# Patient Record
Sex: Female | Born: 1940 | Race: Black or African American | Hispanic: No | Smoking: Former smoker
Health system: Southern US, Community
[De-identification: ages and names within clinical notes are randomized; demographics above are authoritative.]

## PROBLEM LIST (undated history)

## (undated) DIAGNOSIS — G8929 Other chronic pain: Secondary | ICD-10-CM

## (undated) DIAGNOSIS — F0150 Vascular dementia without behavioral disturbance: Secondary | ICD-10-CM

## (undated) DIAGNOSIS — Z8673 Personal history of transient ischemic attack (TIA), and cerebral infarction without residual deficits: Secondary | ICD-10-CM

## (undated) DIAGNOSIS — I69359 Hemiplegia and hemiparesis following cerebral infarction affecting unspecified side: Secondary | ICD-10-CM

## (undated) DIAGNOSIS — L03119 Cellulitis of unspecified part of limb: Secondary | ICD-10-CM

## (undated) DIAGNOSIS — L02419 Cutaneous abscess of limb, unspecified: Secondary | ICD-10-CM

## (undated) DIAGNOSIS — I872 Venous insufficiency (chronic) (peripheral): Secondary | ICD-10-CM

## (undated) DIAGNOSIS — M1990 Unspecified osteoarthritis, unspecified site: Secondary | ICD-10-CM

## (undated) DIAGNOSIS — I5032 Chronic diastolic (congestive) heart failure: Secondary | ICD-10-CM

## (undated) DIAGNOSIS — R600 Localized edema: Secondary | ICD-10-CM

## (undated) DIAGNOSIS — M545 Low back pain, unspecified: Secondary | ICD-10-CM

## (undated) DIAGNOSIS — K219 Gastro-esophageal reflux disease without esophagitis: Secondary | ICD-10-CM

## (undated) DIAGNOSIS — K429 Umbilical hernia without obstruction or gangrene: Secondary | ICD-10-CM

## (undated) DIAGNOSIS — E785 Hyperlipidemia, unspecified: Secondary | ICD-10-CM

## (undated) DIAGNOSIS — G4733 Obstructive sleep apnea (adult) (pediatric): Secondary | ICD-10-CM

## (undated) DIAGNOSIS — F0390 Unspecified dementia without behavioral disturbance: Secondary | ICD-10-CM

## (undated) DIAGNOSIS — I119 Hypertensive heart disease without heart failure: Secondary | ICD-10-CM

## (undated) DIAGNOSIS — M1711 Unilateral primary osteoarthritis, right knee: Secondary | ICD-10-CM

## (undated) DIAGNOSIS — F332 Major depressive disorder, recurrent severe without psychotic features: Secondary | ICD-10-CM

## (undated) DIAGNOSIS — R32 Unspecified urinary incontinence: Secondary | ICD-10-CM

## (undated) DIAGNOSIS — I4819 Other persistent atrial fibrillation: Secondary | ICD-10-CM

## (undated) DIAGNOSIS — M4800 Spinal stenosis, site unspecified: Secondary | ICD-10-CM

## (undated) DIAGNOSIS — T4145XA Adverse effect of unspecified anesthetic, initial encounter: Secondary | ICD-10-CM

## (undated) DIAGNOSIS — T8859XA Other complications of anesthesia, initial encounter: Secondary | ICD-10-CM

## (undated) DIAGNOSIS — E210 Primary hyperparathyroidism: Secondary | ICD-10-CM

## (undated) DIAGNOSIS — F419 Anxiety disorder, unspecified: Secondary | ICD-10-CM

## (undated) DIAGNOSIS — I8390 Asymptomatic varicose veins of unspecified lower extremity: Secondary | ICD-10-CM

## (undated) DIAGNOSIS — Z8719 Personal history of other diseases of the digestive system: Secondary | ICD-10-CM

## (undated) DIAGNOSIS — Z8489 Family history of other specified conditions: Secondary | ICD-10-CM

## (undated) DIAGNOSIS — IMO0002 Reserved for concepts with insufficient information to code with codable children: Secondary | ICD-10-CM

## (undated) DIAGNOSIS — Z9989 Dependence on other enabling machines and devices: Secondary | ICD-10-CM

## (undated) HISTORY — PX: CATARACT EXTRACTION: SUR2

## (undated) HISTORY — PX: CHOLECYSTECTOMY: SHX55

## (undated) HISTORY — DX: Localized edema: R60.0

## (undated) HISTORY — DX: Major depressive disorder, recurrent severe without psychotic features: F33.2

## (undated) HISTORY — DX: Chronic diastolic (congestive) heart failure: I50.32

## (undated) HISTORY — DX: Hemiplegia and hemiparesis following cerebral infarction affecting unspecified side: I69.359

## (undated) HISTORY — DX: Venous insufficiency (chronic) (peripheral): I87.2

## (undated) HISTORY — DX: Reserved for concepts with insufficient information to code with codable children: IMO0002

## (undated) HISTORY — PX: COLONOSCOPY: SHX174

## (undated) HISTORY — DX: Personal history of transient ischemic attack (TIA), and cerebral infarction without residual deficits: Z86.73

## (undated) HISTORY — DX: Hypertensive heart disease without heart failure: I11.9

## (undated) HISTORY — DX: Spinal stenosis, site unspecified: M48.00

## (undated) HISTORY — DX: Hyperlipidemia, unspecified: E78.5

## (undated) HISTORY — PX: IUD REMOVAL: SHX5392

## (undated) HISTORY — DX: Other persistent atrial fibrillation: I48.19

## (undated) HISTORY — DX: Primary hyperparathyroidism: E21.0

---

## 1978-09-08 DIAGNOSIS — Z8711 Personal history of peptic ulcer disease: Secondary | ICD-10-CM

## 1978-09-08 DIAGNOSIS — Z8719 Personal history of other diseases of the digestive system: Secondary | ICD-10-CM

## 1978-09-08 HISTORY — DX: Personal history of other diseases of the digestive system: Z87.19

## 1978-09-08 HISTORY — DX: Personal history of peptic ulcer disease: Z87.11

## 1998-10-30 ENCOUNTER — Other Ambulatory Visit: Admission: RE | Admit: 1998-10-30 | Discharge: 1998-10-30 | Payer: Self-pay | Admitting: Internal Medicine

## 1998-12-16 ENCOUNTER — Other Ambulatory Visit: Admission: RE | Admit: 1998-12-16 | Discharge: 1998-12-16 | Payer: Self-pay | Admitting: General Surgery

## 1999-01-26 ENCOUNTER — Ambulatory Visit (HOSPITAL_BASED_OUTPATIENT_CLINIC_OR_DEPARTMENT_OTHER): Admission: RE | Admit: 1999-01-26 | Discharge: 1999-01-26 | Payer: Self-pay | Admitting: General Surgery

## 1999-01-26 ENCOUNTER — Encounter (INDEPENDENT_AMBULATORY_CARE_PROVIDER_SITE_OTHER): Payer: Self-pay | Admitting: *Deleted

## 1999-06-06 ENCOUNTER — Other Ambulatory Visit: Admission: RE | Admit: 1999-06-06 | Discharge: 1999-06-06 | Payer: Self-pay | Admitting: Internal Medicine

## 2000-03-12 ENCOUNTER — Encounter (INDEPENDENT_AMBULATORY_CARE_PROVIDER_SITE_OTHER): Payer: Self-pay | Admitting: Specialist

## 2000-03-12 ENCOUNTER — Ambulatory Visit (HOSPITAL_COMMUNITY): Admission: RE | Admit: 2000-03-12 | Discharge: 2000-03-12 | Payer: Self-pay | Admitting: *Deleted

## 2002-08-06 ENCOUNTER — Other Ambulatory Visit: Admission: RE | Admit: 2002-08-06 | Discharge: 2002-08-06 | Payer: Self-pay | Admitting: *Deleted

## 2002-10-22 ENCOUNTER — Encounter: Payer: Self-pay | Admitting: General Surgery

## 2002-10-28 ENCOUNTER — Inpatient Hospital Stay (HOSPITAL_COMMUNITY): Admission: RE | Admit: 2002-10-28 | Discharge: 2002-10-29 | Payer: Self-pay | Admitting: General Surgery

## 2003-08-23 ENCOUNTER — Other Ambulatory Visit: Admission: RE | Admit: 2003-08-23 | Discharge: 2003-08-23 | Payer: Self-pay | Admitting: Internal Medicine

## 2004-12-04 ENCOUNTER — Other Ambulatory Visit: Admission: RE | Admit: 2004-12-04 | Discharge: 2004-12-04 | Payer: Self-pay | Admitting: Internal Medicine

## 2006-10-01 ENCOUNTER — Encounter: Admission: RE | Admit: 2006-10-01 | Discharge: 2006-10-01 | Payer: Self-pay | Admitting: Family Medicine

## 2007-01-22 ENCOUNTER — Encounter: Admission: RE | Admit: 2007-01-22 | Discharge: 2007-01-22 | Payer: Self-pay | Admitting: Family Medicine

## 2007-08-31 ENCOUNTER — Other Ambulatory Visit: Admission: RE | Admit: 2007-08-31 | Discharge: 2007-08-31 | Payer: Self-pay | Admitting: Family Medicine

## 2009-02-09 ENCOUNTER — Encounter (HOSPITAL_COMMUNITY): Admission: RE | Admit: 2009-02-09 | Discharge: 2009-04-13 | Payer: Self-pay | Admitting: Family Medicine

## 2009-11-13 ENCOUNTER — Encounter (HOSPITAL_COMMUNITY)
Admission: RE | Admit: 2009-11-13 | Discharge: 2010-01-06 | Payer: Self-pay | Source: Home / Self Care | Attending: Family Medicine | Admitting: Family Medicine

## 2009-12-05 ENCOUNTER — Encounter: Admission: RE | Admit: 2009-12-05 | Discharge: 2009-12-05 | Payer: Self-pay | Admitting: Family Medicine

## 2009-12-16 ENCOUNTER — Encounter
Admission: RE | Admit: 2009-12-16 | Discharge: 2009-12-16 | Payer: Self-pay | Source: Home / Self Care | Attending: Neurosurgery | Admitting: Neurosurgery

## 2010-01-28 ENCOUNTER — Encounter: Payer: Self-pay | Admitting: Family Medicine

## 2010-04-25 ENCOUNTER — Other Ambulatory Visit: Payer: Self-pay | Admitting: Family Medicine

## 2010-04-25 DIAGNOSIS — Z09 Encounter for follow-up examination after completed treatment for conditions other than malignant neoplasm: Secondary | ICD-10-CM

## 2010-05-09 ENCOUNTER — Other Ambulatory Visit: Payer: Self-pay | Admitting: Family Medicine

## 2010-05-09 ENCOUNTER — Ambulatory Visit
Admission: RE | Admit: 2010-05-09 | Discharge: 2010-05-09 | Disposition: A | Discharge status: Home / Self Care | Payer: PRIVATE HEALTH INSURANCE | Source: Ambulatory Visit | Attending: Family Medicine | Admitting: Family Medicine

## 2010-05-09 DIAGNOSIS — Z09 Encounter for follow-up examination after completed treatment for conditions other than malignant neoplasm: Secondary | ICD-10-CM

## 2010-05-16 ENCOUNTER — Other Ambulatory Visit: Payer: Self-pay | Admitting: Gastroenterology

## 2010-05-25 NOTE — Op Note (Signed)
[facility]  Patient:    Susan [name]                            MRN: [id number] Proc. Date: 01/26/99 Adm. Date:  [id number] Attending:  Brandy Hale CC:         Kendrick Ranch, M.D.                           Operative Report  PREOPERATIVE DIAGNOSIS:  Right breast mass.  POSTOPERATIVE DIAGNOSIS:  Right breast mass.  PROCEDURE:  Excisional biopsy of right breast mass.  SURGEON:  Angelia Mould. Derrell Lolling, M.D.  ANESTHESIA:  OPERATIVE INDICATIONS:  This is a 70 year old black female who was found to have a palpable mass in the 11:30 position of the right breast on routine breast exam.  Mammography and ultrasound were not diagnostic.  The patient is concerned because her mother had breast cancer.  A fine-needle aspiration cytology of this area showed nondiagnostic material.  She is brought to the operating room to biopsy his focal area of thickening.  DESCRIPTION OF PROCEDURE:  The patient was brought to the operating room and placed supine on the operating table.  She was monitored and sedated by the anesthesia  department.  The right breast was prepped and draped in a sterile fashion.  One  percent Xylocaine with epinephrine was used as a local infiltration anesthetic. A curved incision was made at the 11:30 position of the right breast, about 2 cm peripheral to the alveolar margin.  The incision was parallel to the alveolar margin.  Dissection was carried down deep into the breast tissue, thoroughly excised a thickened area of breast tissue about 3 x 4 cm in diameter.  This had the appearance of fibrosis and fibrocystic breast disease.  This was sent for permanent histology.  Hemostasis was excellent and achieved with electrocautery.  The wound was irrigated with saline.  The skin was closed with a running subcuticular suture of 4-0 Vicryl and Steri-Strips.  Clean bandages were placed and the patient taken to the recovery  room in stable  condition.  Estimated blood loss was about 10 cc.  Complications none.  Sponge,  needle, and instrument counts were correct. DD:  01/26/99 TD:  01/27/99 Job: [id number]

## 2010-05-25 NOTE — Op Note (Signed)
NAME:  Susan [name], Susan [name]                            ACCOUNT NO.:  [id number]   MEDICAL RECORD NO.:  [id number]                   PATIENT TYPE:  AMB   LOCATION:  DAY                                  FACILITY:  [facility]   PHYSICIAN:  Angelia Mould. Derrell Lolling, M.D.             DATE OF BIRTH:  02-21-1940   DATE OF PROCEDURE:  10/27/2002  DATE OF DISCHARGE:                                 OPERATIVE REPORT   PREOPERATIVE DIAGNOSIS:  Ventral incisional hernia.   POSTOPERATIVE DIAGNOSIS:  Ventral incisional hernia.   OPERATION PERFORMED:  Laparoscopic repair of ventral incisional hernia with  15 cm x 16 cm Parietex composite mesh.   SURGEON:  Angelia Mould. Derrell Lolling, M.D.   FIRST ASSISTANT:  Currie Paris, M.D.   INDICATIONS FOR PROCEDURE:  This is a 70 year old black female who underwent  laparoscopic cholecystectomy in [place] in February of this year. The  surgery was uneventful but the surgeon described an umbilical hernia that  was repaired and also postop care was complicated by a wound infection at  the umbilicus which did not heal. She developed progressive bulge which is  painful. On exam, she is somewhat overweight with well healed laparoscopy  scars. She has a hernia defect at the umbilicus which is at least 5 cm in  size, this is completely reducible. She is brought to the operating room  electively.   FINDINGS:  The patient had a ventral hernia centered around the umbilicus.  The defect was at least 6 cm in size. There was some omentum incarcerated at  the superior rim but this was easily dissected away with good visualization  of the transverse colon. There were no other hernia defects noted. The liver  looked normal. The stomach looked normal. The omentum, small bowel and colon  otherwise looked normal. There was on free fluid.   TECHNIQUE:  Following the induction general endotracheal anesthesia, an oral  gastric tube and a Foley catheter were inserted. The abdomen was prepped  and  draped in a sterile fashion. 0.5% Marcaine with epinephrine was used as a  local infiltration anesthetic. Using an Optiview port and a 0 degree camera,  we inserted a 10 mm trocar in the left subcostal region. This was done under  direct vision and we passed through the abdominal wall layers fairly easily.  Insufflation was carried out to 15 mmHg. The video camera was inserted with  visualization and findings as described above. The two 5 mm trocars were  placed in the right mid abdomen and two 5 mm trocars placed in the left mid  abdomen well lateral. Omental adhesions were visualized and inspected and  then taken down using sharp dissection. A few bleeders required  cauterization but not much. There was minimal bleeding. Using a spinal  needle, we marked the edges of the hernia defect marking the abdominal wall  with a skin marker. We  then measured 3-4 cm outside of this perimeter of the  hernia defect and marked that and it turned to be about 15 cm in almost  perfect circle. We brought a 15 x 20 cm piece of Parietex composite mesh to  the operative field. We drew a template on the abdominal wall with eight  equidistant suture fixation points. We then transferred this template to the  mesh. We then cut the mesh down to size. We then carefully marked the smooth  side of the mesh and placed eight sutures on the rough side of the mesh  using 0 Novofil sutures in the 8 suture fixation points. We then  moistened  the mesh, rolled it up and inserted it into the abdominal cavity. We then  passed the 8 suture fixation points through the abdominal wall and after  these were all passed we held them up and it looked like the coverage was  good and there was no unusual tension but no redundancy. We then tied all of  the suture fixation points and buried the knots under the skin. Then used a  5 mm tacker and using about 45-50 of the 5 mm metal takers, we tacked the  perimeter of the mesh and the  center of the mesh. At the perimeter of the  mesh, we were very careful and repeatedly inspected to make sure that all of  the tacks were at least within 1 cm of each other with no gaps whatsoever.  We then inspected the repair, inspected all of the abdominal viscera and  found no problems. The trocars were removed under direct vision, there was  no bleeding from any of the trocar sites. The pneumoperitoneum was released.  The skin incisions were closed with subcuticular sutures of 4-0 Vicryl and  Steri-Strips. A clean bandage and a Velcro binder was placed and the patient  taken to the recovery room in stable condition. Estimated blood loss was  about 25 mL. Complications none. Sponge, needle and instrument counts were  correct.                                               Angelia Mould. Derrell Lolling, M.D.    HMI/MEDQ  D:  10/27/2002  T:  10/27/2002  Job:  [id number]   cc:   Sharlet Salina, M.D.  [address]  [place]  [place] [address]  Fax: [phone]   Chester Holstein. Earlene Plater, M.D.  [address]  [place]  [place] [address]  Fax: [phone]

## 2010-12-14 ENCOUNTER — Ambulatory Visit
Admission: RE | Admit: 2010-12-14 | Discharge: 2010-12-14 | Disposition: A | Discharge status: Home / Self Care | Payer: Medicare Other | Source: Ambulatory Visit | Attending: Family Medicine | Admitting: Family Medicine

## 2010-12-14 ENCOUNTER — Other Ambulatory Visit: Payer: Self-pay | Admitting: Family Medicine

## 2010-12-14 DIAGNOSIS — R05 Cough: Secondary | ICD-10-CM

## 2011-01-28 ENCOUNTER — Ambulatory Visit
Admission: RE | Admit: 2011-01-28 | Discharge: 2011-01-28 | Disposition: A | Discharge status: Home / Self Care | Payer: Medicare Other | Source: Ambulatory Visit | Attending: Family Medicine | Admitting: Family Medicine

## 2011-01-28 ENCOUNTER — Other Ambulatory Visit: Payer: Self-pay | Admitting: Family Medicine

## 2011-01-28 DIAGNOSIS — R05 Cough: Secondary | ICD-10-CM

## 2011-03-27 ENCOUNTER — Other Ambulatory Visit: Payer: Self-pay | Admitting: Certified Registered Nurse Anesthetist

## 2011-04-24 ENCOUNTER — Emergency Department (INDEPENDENT_AMBULATORY_CARE_PROVIDER_SITE_OTHER): Payer: Medicare Other

## 2011-04-24 ENCOUNTER — Encounter (HOSPITAL_BASED_OUTPATIENT_CLINIC_OR_DEPARTMENT_OTHER): Payer: Self-pay | Admitting: Emergency Medicine

## 2011-04-24 ENCOUNTER — Emergency Department (HOSPITAL_BASED_OUTPATIENT_CLINIC_OR_DEPARTMENT_OTHER)
Admission: EM | Admit: 2011-04-24 | Discharge: 2011-04-24 | Disposition: A | Discharge status: Home / Self Care | Payer: Medicare Other | Attending: Emergency Medicine | Admitting: Emergency Medicine

## 2011-04-24 DIAGNOSIS — I289 Disease of pulmonary vessels, unspecified: Secondary | ICD-10-CM

## 2011-04-24 DIAGNOSIS — W19XXXA Unspecified fall, initial encounter: Secondary | ICD-10-CM

## 2011-04-24 DIAGNOSIS — R404 Transient alteration of awareness: Secondary | ICD-10-CM

## 2011-04-24 DIAGNOSIS — M25469 Effusion, unspecified knee: Secondary | ICD-10-CM

## 2011-04-24 DIAGNOSIS — M25561 Pain in right knee: Secondary | ICD-10-CM

## 2011-04-24 DIAGNOSIS — M25569 Pain in unspecified knee: Secondary | ICD-10-CM | POA: Insufficient documentation

## 2011-04-24 DIAGNOSIS — R51 Headache: Secondary | ICD-10-CM

## 2011-04-24 DIAGNOSIS — W010XXA Fall on same level from slipping, tripping and stumbling without subsequent striking against object, initial encounter: Secondary | ICD-10-CM | POA: Insufficient documentation

## 2011-04-24 DIAGNOSIS — M1710 Unilateral primary osteoarthritis, unspecified knee: Secondary | ICD-10-CM

## 2011-04-24 LAB — URINALYSIS, ROUTINE W REFLEX MICROSCOPIC
Glucose, UA: NEGATIVE mg/dL
Hgb urine dipstick: NEGATIVE
pH: 5 (ref 5.0–8.0)

## 2011-04-24 LAB — COMPREHENSIVE METABOLIC PANEL
ALT: 24 U/L (ref 0–35)
Alkaline Phosphatase: 69 U/L (ref 39–117)
BUN: 16 mg/dL (ref 6–23)
CO2: 28 mEq/L (ref 19–32)
Calcium: 10.7 mg/dL — ABNORMAL HIGH (ref 8.4–10.5)
GFR calc Af Amer: 72 mL/min — ABNORMAL LOW (ref >90)
GFR calc non Af Amer: 62 mL/min — ABNORMAL LOW (ref >90)
Glucose, Bld: 110 mg/dL — ABNORMAL HIGH (ref 70–99)
Sodium: 141 mEq/L (ref 135–145)

## 2011-04-24 LAB — CBC
HCT: 34.3 % — ABNORMAL LOW (ref 36.0–46.0)
Hemoglobin: 11.8 g/dL — ABNORMAL LOW (ref 12.0–15.0)
MCH: 31.3 pg (ref 26.0–34.0)
MCV: 91 fL (ref 78.0–100.0)
Platelets: 219 10*3/uL (ref 150–400)
RBC: 3.77 MIL/uL — ABNORMAL LOW (ref 3.87–5.11)
WBC: 5 10*3/uL (ref 4.0–10.5)

## 2011-04-24 LAB — POCT I-STAT 3, ART BLOOD GAS (G3+)
TCO2: 27 mmol/L (ref 0–100)
pH, Arterial: 7.345 — ABNORMAL LOW (ref 7.350–7.400)

## 2011-04-24 LAB — RAPID URINE DRUG SCREEN, HOSP PERFORMED
Amphetamines: NOT DETECTED
Barbiturates: NOT DETECTED
Tetrahydrocannabinol: NOT DETECTED

## 2011-04-24 LAB — LIPASE, BLOOD: Lipase: 45 U/L (ref 11–59)

## 2011-04-24 LAB — DIFFERENTIAL
Eosinophils Absolute: 0.2 10*3/uL (ref 0.0–0.7)
Eosinophils Relative: 4 % (ref 0–5)
Lymphocytes Relative: 27 % (ref 12–46)
Lymphs Abs: 1.3 10*3/uL (ref 0.7–4.0)
Monocytes Relative: 13 % — ABNORMAL HIGH (ref 3–12)

## 2011-04-24 LAB — GLUCOSE, CAPILLARY: Glucose-Capillary: 101 mg/dL — ABNORMAL HIGH (ref 70–99)

## 2011-04-24 LAB — URINE MICROSCOPIC-ADD ON

## 2011-04-24 LAB — ETHANOL: Alcohol, Ethyl (B): <11 mg/dL (ref 0–11)

## 2011-04-24 LAB — PROTIME-INR: Prothrombin Time: 12.8 seconds (ref 11.6–15.2)

## 2011-04-24 MED ORDER — TRAMADOL HCL 50 MG PO TABS: 50.0000 mg | ORAL_TABLET | Freq: Four times a day (QID) | ORAL | Status: AC | PRN

## 2011-04-24 MED ORDER — SODIUM CHLORIDE 0.9 % IV BOLUS (SEPSIS)
1000.0000 mL | Freq: Once | INTRAVENOUS | Status: AC
  Administered 2011-04-24: 1000 mL via INTRAVENOUS

## 2011-04-24 NOTE — ED Notes (Signed)
Fell at [facility] injured  Rt knee and elbow

## 2011-04-24 NOTE — ED Notes (Signed)
Pt ambulated to restroom with minimal assist. Pt alert and oriented x4, family at bs.

## 2011-04-24 NOTE — ED Provider Notes (Signed)
History     CSN: [id number]  Arrival date & time 04/24/11  0208   First MD Initiated Contact with Patient 04/24/11 0211      Chief Complaint  Patient presents with  . Fall    (Consider location/radiation/quality/duration/timing/severity/associated sxs/prior treatment) HPI Patient is a 71 year old female who presents by EMS with her granddaughter for evaluation of the right elbow and right knee injury. Patient apparently was by herself this afternoon when she tripped and fell. Since that time the patient had complained of right knee and right elbow pain to family. Patient had been unable to get up herself bystander had to help her out. Patient had then gone home and got in her bathtub and been unable to get out of her bathtub. Family had come to assist her out. With her granddaughter went to check on the patient the patient was very upset. She spoke to her regular doctor who recommended coming to the ER to be evaluated. Family has been trying to get the patient evaluated recently for problems with fluctuating mental status. Here patient is very somnolent but can be stimulated with sternal rub and a stent oriented x4. Patient is not at her baseline per family. Patient has had no recent cough or illnesses. She's had no other complaints. On my exam the patient complains only of 3/10 right knee pain. She is hemodynamically stable but when sleeping soundly he does desat as low as 85%. Family reports that the patient does not normally wear CPAP. They do have concerns over patient's ingestion of medications given her recent confusion. Family reports that the patient has depression but that they did not think that she has taken any medications in an attempt to hurt herself.  Granddaughter reports that her grandmother's mental state has declined since she arrived prior to EMS. History is otherwise limited secondary to patient's mental state. Past Medical History  Diagnosis Date  . Hypertension      History reviewed. No pertinent past surgical history.  No family history on file.  History  Substance Use Topics  . Smoking status: Not on file  . Smokeless tobacco: Not on file  . Alcohol Use: No    OB History    Grav Para Term Preterm Abortions TAB SAB Ect Mult Living                  Review of Systems  Unable to perform ROS: Mental status change    Allergies  Review of patient's allergies indicates no known allergies.  Home Medications  No current outpatient prescriptions on file.  BP 152/75  Pulse 60  Temp(Src) 98.3 F (36.8 C) (Oral)  Resp 17  SpO2 94%  Physical Exam  Nursing note and vitals reviewed. GEN: Well-developed, well-nourished female in no distress HEENT: Atraumatic, normocephalic. Oropharynx clear without erythema EYES: PERRLA BL, no scleral icterus. NECK: Trachea midline, no meningismus CV: regular rate and rhythm. No murmurs, rubs, or gallops PULM: No respiratory distress.  No crackles, wheezes, or rales. GI: soft, non-tender. No guarding, rebound, or tenderness. + bowel sounds  GU: deferred Neuro: cranial nerves 2-12 intact, no abnormalities of strength or sensation, somnolent but arousable to sternal rub and then O x 3 MSK: Patient moves all 4 extremities symmetrically, no deformity, edema, or injury noted. Patient does have slight tenderness to palpation over the right knee with no ligamentous instability or deformity noted. Skin: No rashes petechiae, purpura, or jaundice   ED Course  Procedures (including critical care time)  Date: 04/24/2011  Rate: 63  Rhythm: normal sinus rhythm  QRS Axis: normal  Intervals: normal  ST/T Wave abnormalities: nonspecific T wave changes  Conduction Disutrbances:none  Narrative Interpretation:   Old EKG Reviewed: none available   Labs Reviewed  CBC - Abnormal; Notable for the following:    RBC 3.77 (*)    Hemoglobin 11.8 (*)    HCT 34.3 (*)    All other components within normal limits   DIFFERENTIAL - Abnormal; Notable for the following:    Monocytes Relative 13 (*)    All other components within normal limits  POCT I-STAT 3, BLOOD GAS (G3+) - Abnormal; Notable for the following:    pH, Arterial 7.345 (*)    pCO2 arterial 47.7 (*)    pO2, Arterial 31.0 (*)    Bicarbonate 26.0 (*)    All other components within normal limits  GLUCOSE, CAPILLARY - Abnormal; Notable for the following:    Glucose-Capillary 101 (*)    All other components within normal limits  PROTIME-INR  COMPREHENSIVE METABOLIC PANEL  LIPASE, BLOOD  BLOOD GAS, ARTERIAL  AMMONIA  ACETAMINOPHEN LEVEL  SALICYLATE LEVEL  URINALYSIS, ROUTINE W REFLEX MICROSCOPIC  URINE CULTURE  TROPONIN I  URINE RAPID DRUG SCREEN (HOSP PERFORMED)  ETHANOL   Dg Chest 2 View  04/24/2011  *RADIOLOGY REPORT*  Clinical Data: Status post fall; headache and altered level of consciousness.  Concern for chest injury.  CHEST - 2 VIEW  Comparison: Chest radiograph performed 01/28/2011  Findings: The lungs are hypoexpanded.  Vascular congestion is noted, with diffusely increased interstitial markings, raising concern for mild pulmonary edema.  No definite pleural effusion or pneumothorax is seen.  The heart is borderline enlarged; the mediastinal contour is within normal limits.  No acute osseous abnormalities are seen.  IMPRESSION:  1.  Lungs hypoexpanded; vascular congestion and borderline cardiomegaly, with diffusely increased interstitial markings, raising concern for mild interstitial edema. 2.  No displaced rib fractures seen.  Original Report Authenticated By: Tonia Ghent, M.D.   Ct Head Wo Contrast  04/24/2011  *RADIOLOGY REPORT*  Clinical Data: Status post fall; headache and altered level of consciousness.  CT HEAD WITHOUT CONTRAST  Technique:  Contiguous axial images were obtained from the base of the skull through the vertex without contrast.  Comparison: None.  Findings: There is no evidence of acute infarction, mass  lesion, or intra- or extra-axial hemorrhage on CT.  Prominence of the ventricles and sulci reflects mild cortical volume loss.  Scattered periventricular and subcortical white matter change likely reflects small vessel ischemic microangiopathy.  The posterior fossa, including the cerebellum, brainstem and fourth ventricle, is within normal limits.  The basal ganglia are unremarkable in appearance.  The cerebral hemispheres demonstrate grossly normal gray-white differentiation.  No mass effect or midline shift is seen.  There is no evidence of fracture; visualized osseous structures are unremarkable in appearance.  The visualized portions of the orbits are within normal limits.  The paranasal sinuses and mastoid air cells are well-aerated.  No significant soft tissue abnormalities are seen.  IMPRESSION:  1.  No evidence of traumatic intracranial injury or fracture. 2.  Mild cortical volume loss and scattered small vessel ischemic angiopathy.  Original Report Authenticated By: Tonia Ghent, M.D.   Dg Knee Complete 4 Views Right  04/24/2011  *RADIOLOGY REPORT*  Clinical Data: Status post fall; right knee pain.  RIGHT KNEE - COMPLETE 4+ VIEW  Comparison: None.  Findings: There is no evidence of fracture or dislocation. Marginal  osteophytes are noted at all three compartments, without significant joint space narrowing.  Prominent tibial spine and wall osteophytes are also seen.  A fabella is noted.  A trace joint effusion is seen.  The visualized soft tissues are otherwise unremarkable in appearance.  IMPRESSION:  1.  No evidence of fracture or dislocation. 2.  Tricompartmental osteoarthritis noted. 3.  Trace knee joint effusion noted.  Original Report Authenticated By: Tonia Ghent, M.D.     1. Knee pain, right   2. Fall       MDM  Patient was evaluated by myself. Based on patient's somnolence I was concerned for possible intracranial injury or other process including toxic ingestion or infectious  process.  Family confirmed that this is very different for the patient. Workup was performed. This included head CT as well as EKG, troponin, and chest x-ray, urinalysis, urine drug screen, Tylenol level, salicylate level, ethanol level, CBC, and CMP. ABG was ordered but this returned a mixed sample. There is no hypercarbia or acidosis noted. Patient was given a liter of normal saline IV bolus. CBC, coags, and EKG were within normal limits. Fingerstick was 101.  Extensive workup was negative. Patient remained somnolent but was able to be awoken him to ambulate. Patient and family were notified of results. They were comfortable with plan for discharge home. Patient was able to ambulate around the emergency department without difficulty. She was discharged home with a prescription for 15 tabs of Tylenol. 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        Cyndra Numbers, MD 04/24/11 [phone]

## 2011-04-24 NOTE — ED Notes (Signed)
Pt return from radiology, O2 continues @2L  via N/C, SR on monitor, IV fluids infusing without diff, IV site unremarkable. Family at bs, SR up x2.

## 2011-04-24 NOTE — Discharge Instructions (Signed)
Arthralgia Your caregiver has diagnosed you as suffering from an arthralgia. Arthralgia means there is pain in a joint. This can come from many reasons including:  Bruising the joint which causes soreness (inflammation) in the joint.   Wear and tear on the joints which occur as we grow older (osteoarthritis).   Overusing the joint.   Various forms of arthritis.   Infections of the joint.  Regardless of the cause of pain in your joint, most of these different pains respond to anti-inflammatory drugs and rest. The exception to this is when a joint is infected, and these cases are treated with antibiotics, if it is a bacterial infection. HOME CARE INSTRUCTIONS   Rest the injured area for as long as directed by your caregiver. Then slowly start using the joint as directed by your caregiver and as the pain allows. Crutches as directed may be useful if the ankles, knees or hips are involved. If the knee was splinted or casted, continue use and care as directed. If an stretchy or elastic wrapping bandage has been applied today, it should be removed and re-applied every 3 to 4 hours. It should not be applied tightly, but firmly enough to keep swelling down. Watch toes and feet for swelling, bluish discoloration, coldness, numbness or excessive pain. If any of these problems (symptoms) occur, remove the ace bandage and re-apply more loosely. If these symptoms persist, contact your caregiver or return to this location.   For the first 24 hours, keep the injured extremity elevated on pillows while lying down.   Apply ice for 15 to 20 minutes to the sore joint every couple hours while awake for the first half day. Then 3 to 4 times per day for the first 48 hours. Put the ice in a plastic bag and place a towel between the bag of ice and your skin.   Wear any splinting, casting, elastic bandage applications, or slings as instructed.   Only take over-the-counter or prescription medicines for pain,  discomfort, or fever as directed by your caregiver. Do not use aspirin immediately after the injury unless instructed by your physician. Aspirin can cause increased bleeding and bruising of the tissues.   If you were given crutches, continue to use them as instructed and do not resume weight bearing on the sore joint until instructed.  Persistent pain and inability to use the sore joint as directed for more than 2 to 3 days are warning signs indicating that you should see a caregiver for a follow-up visit as soon as possible. Initially, a hairline fracture (break in bone) may not be evident on X-rays. Persistent pain and swelling indicate that further evaluation, non-weight bearing or use of the joint (use of crutches or slings as instructed), or further X-rays are indicated. X-rays may sometimes not show a small fracture until a week or 10 days later. Make a follow-up appointment with your own caregiver or one to whom we have referred you. A radiologist (specialist in reading X-rays) may read your X-rays. Make sure you know how you are to obtain your X-ray results. Do not assume everything is normal if you do not hear from us. SEEK MEDICAL CARE IF: Bruising, swelling, or pain increases. SEEK IMMEDIATE MEDICAL CARE IF:   Your fingers or toes are numb or blue.   The pain is not responding to medications and continues to stay the same or get worse.   The pain in your joint becomes severe.   You develop a fever over   102 F (38.9 C).   It becomes impossible to move or use the joint.  MAKE SURE YOU:   Understand these instructions.   Will watch your condition.   Will get help right away if you are not doing well or get worse.  Document Released: 12/24/2004 Document Revised: 12/13/2010 Document Reviewed: 08/12/2007 [employer] Patient Information 2012 [employer]. 

## 2011-04-24 NOTE — ED Notes (Signed)
Patient transported to X-ray 

## 2011-04-24 NOTE — ED Notes (Signed)
Fell around 3pm  Complains of knee pain

## 2011-04-24 NOTE — ED Notes (Signed)
Pt was stuck for an ABG (arterial blood gas) but pt moved during the initial puncture and needle was dislodged from the artery. Sample was run but mixed venous was sampled. MD was notified and no redraw took place due to pt moving too much with needle sticks or punctures.

## 2011-04-25 LAB — URINE CULTURE
Colony Count: NO GROWTH
Culture  Setup Time: 201304170620

## 2011-09-02 ENCOUNTER — Other Ambulatory Visit: Payer: Self-pay | Admitting: Neurosurgery

## 2011-09-02 DIAGNOSIS — M4306 Spondylolysis, lumbar region: Secondary | ICD-10-CM

## 2011-09-02 DIAGNOSIS — M48061 Spinal stenosis, lumbar region without neurogenic claudication: Secondary | ICD-10-CM

## 2011-09-13 ENCOUNTER — Ambulatory Visit
Admission: RE | Admit: 2011-09-13 | Discharge: 2011-09-13 | Disposition: A | Discharge status: Home / Self Care | Payer: Medicare Other | Source: Ambulatory Visit | Attending: Neurosurgery | Admitting: Neurosurgery

## 2011-09-13 DIAGNOSIS — M48061 Spinal stenosis, lumbar region without neurogenic claudication: Secondary | ICD-10-CM

## 2011-09-13 DIAGNOSIS — M4306 Spondylolysis, lumbar region: Secondary | ICD-10-CM

## 2011-10-01 ENCOUNTER — Encounter: Payer: Self-pay | Admitting: Pulmonary Disease

## 2011-10-02 ENCOUNTER — Ambulatory Visit (INDEPENDENT_AMBULATORY_CARE_PROVIDER_SITE_OTHER): Payer: Medicare Other | Admitting: Pulmonary Disease

## 2011-10-02 ENCOUNTER — Encounter: Payer: Self-pay | Admitting: Pulmonary Disease

## 2011-10-02 VITALS — BP 124/80 | HR 63 | Temp 98.8°F | Ht 64.0 in | Wt 200.4 lb

## 2011-10-02 DIAGNOSIS — R053 Chronic cough: Secondary | ICD-10-CM | POA: Insufficient documentation

## 2011-10-02 DIAGNOSIS — R05 Cough: Secondary | ICD-10-CM

## 2011-10-02 MED ORDER — OMEPRAZOLE 40 MG PO CPDR: 40.0000 mg | DELAYED_RELEASE_CAPSULE | Freq: Two times a day (BID) | ORAL | Status: DC

## 2011-10-02 NOTE — Patient Instructions (Addendum)
Take chlorpheniramine 4mg , 2 at bedtime each night Omeprazole 40mg  take one in am and pm everyday (stop prilosec) Stop fish oil until we can improve your cough Avoid throat clearing, use hard candy during the day to bathe back of throat (no menthol, mint, or cough drops). followup with me in 3 weeks.

## 2011-10-02 NOTE — Assessment & Plan Note (Signed)
The patient's cough sounds more upper airway in origin than lower.  She has clear lung fields, normal spirometry, and a clear chest x-ray earlier in the year.  She denies having postnasal drip, but is having reflux symptoms despite being on a proton pump inhibitor.  I would like to intensify treatment of reflux disease, and we'll also emperically treat her for postnasal drip with chlorpheniramine.  I have also reviewed the behavioral therapies that can help cyclical coughing.  If she continues to have issues, she may need otolaryngology evaluation to clear her upper airway.

## 2011-10-02 NOTE — Progress Notes (Signed)
  Subjective:    Patient ID: Susan [name], female    DOB: August 12, 1940, 71 y.o.   MRN: [id number]  HPI The patient is a 71 year old female who I've been asked to see for chronic cough.  She has had a cough for over 2 years, but does not feel that it is increasing in severity.  She feels the cough is coming from her throat, but denies frequent throat clearing.  The cough is primarily dry, but can't produce clear mucus at times.  She does not believe the cough is worse with conversation or with strong odors, and states that it can come on suddenly.  She denies any chronic hoarseness or throat pain, but does have an abnormal voice during our visit today.  The patient does have reflux symptoms despite being on Prilosec, and states that she can have regurgitation at times.  She denies chronic sinus disease or significant postnasal drip.  She has no history of asthma or pulmonary disease, and has not seen a change in her exertional tolerance.  She has been on an ACE inhibitor in the past, but currently is on an ARB.  She did have a chest x-ray earlier in the year that showed no acute process.   Review of Systems  Constitutional: Negative for fever and unexpected weight change.  HENT: Negative for ear pain, nosebleeds, congestion, sore throat, rhinorrhea, sneezing, trouble swallowing, dental problem, postnasal drip and sinus pressure.   Eyes: Negative for redness and itching.  Respiratory: Positive for cough. Negative for chest tightness, shortness of breath and wheezing.   Cardiovascular: Negative for palpitations and leg swelling.  Gastrointestinal: Negative for nausea and vomiting.  Genitourinary: Negative for dysuria.  Musculoskeletal: Negative for joint swelling.  Skin: Negative for rash.  Neurological: Negative for headaches.  Hematological: Does not bruise/bleed easily.  Psychiatric/Behavioral: Positive for dysphoric mood ( treated with medication). The patient is not nervous/anxious.          Objective:   Physical Exam Constitutional:  Obese female, no acute distress  HENT:  Nares patent without discharge  Oropharynx without exudate, palate and uvula are elongated.  Eyes:  Perrla, eomi, no scleral icterus  Neck:  No JVD, no TMG  Cardiovascular:  Normal rate, regular rhythm, no rubs or gallops.  2/6 sem        Intact distal pulses  Pulmonary :  Normal breath sounds, no stridor or respiratory distress   No rales, rhonchi, or wheezing  Abdominal:  Soft, nondistended, bowel sounds present.  No tenderness noted.   Musculoskeletal:  1+ lower extremity edema noted.  Lymph Nodes:  No cervical lymphadenopathy noted  Skin:  No cyanosis noted  Neurologic:  Alert, appropriate, moves all 4 extremities without obvious deficit.         Assessment & Plan:

## 2011-10-23 ENCOUNTER — Encounter: Payer: Self-pay | Admitting: Pulmonary Disease

## 2011-10-23 ENCOUNTER — Ambulatory Visit (INDEPENDENT_AMBULATORY_CARE_PROVIDER_SITE_OTHER): Payer: Medicare Other | Admitting: Pulmonary Disease

## 2011-10-23 ENCOUNTER — Ambulatory Visit: Payer: Medicare Other | Admitting: Pulmonary Disease

## 2011-10-23 VITALS — BP 140/82 | HR 89 | Ht 64.0 in | Wt 202.6 lb

## 2011-10-23 DIAGNOSIS — R05 Cough: Secondary | ICD-10-CM

## 2011-10-23 NOTE — Assessment & Plan Note (Signed)
The patient has a chronic cough that is most likely related to an upper airway issue.  I have stressed to her the importance of compliance with our plan, and asked her to get the chlorpheniramine to take nightly.  She is to stay on her proton pump inhibitor as well.  She needs to come off the ACE inhibitor, and given her history of a two-year cough, I would not recommend this treatment be used.  I also asked her to keep working on behavioral therapies.  Given her persistent cough and hoarseness, I think she also needs an upper airway evaluation by otolaryngology.

## 2011-10-23 NOTE — Patient Instructions (Addendum)
Please call your primary md and let them know I have recommended staying off lotensin in light of your chronic cough.  I will send her a note today. You need to get chlorpheniramine 4mg , and take 2 each night at bedtime.  It is important that you do this.  Stay on your omeprazole 40mg  am and pm for reflux. Will refer to ENT to look at your voice box.  Please call me in 3 weeks with your response to treatment.

## 2011-10-23 NOTE — Progress Notes (Signed)
  Subjective:    Patient ID: Susan [name], female    DOB: 10-Feb-1940, 71 y.o.   MRN: [id number]  HPI The patient comes in today for followup of her chronic cough.  At the last visit, she was felt to have cough from an upper airway source, and we intensified her reflux treatment and added chlorpheniramine for possible postnasal drip.  Unfortunately, the patient did not get the chlorpheniramine, but is taking her b.i.d. Proton pump inhibitor.  We also discussed behavioral therapies that helps with cyclical coughing.  Since the last visit, the patient states that her cough has actually worsened.  However, I also noted that her blood pressure medication has been changed to an ACE inhibitor.   Review of Systems  Constitutional: Negative for fever and unexpected weight change.  HENT: Positive for congestion, rhinorrhea and postnasal drip. Negative for ear pain, nosebleeds, sore throat, sneezing, trouble swallowing, dental problem and sinus pressure.   Eyes: Negative for redness and itching.  Respiratory: Positive for cough. Negative for chest tightness, shortness of breath and wheezing.   Cardiovascular: Positive for leg swelling. Negative for palpitations.  Gastrointestinal: Negative for nausea and vomiting.  Genitourinary: Negative for dysuria.  Musculoskeletal: Negative for joint swelling.  Skin: Negative for rash.  Neurological: Negative for headaches.  Hematological: Bruises/bleeds easily.  Psychiatric/Behavioral: Positive for dysphoric mood. The patient is nervous/anxious.        Objective:   Physical Exam Obese female in no acute distress.  Hoarse voice noted Nose without purulent discharge seen Neck without lymphadenopathy or thyromegaly Chest totally clear to auscultation Cardiac exam is regular rate and rhythm Lower extremities with minimal edema, no cyanosis Alert and oriented, moves all 4 extremities.       Assessment & Plan:

## 2012-01-26 ENCOUNTER — Emergency Department (HOSPITAL_BASED_OUTPATIENT_CLINIC_OR_DEPARTMENT_OTHER)
Admission: EM | Admit: 2012-01-26 | Discharge: 2012-01-26 | Disposition: A | Discharge status: Home / Self Care | Payer: No Typology Code available for payment source | Attending: Emergency Medicine | Admitting: Emergency Medicine

## 2012-01-26 ENCOUNTER — Emergency Department (HOSPITAL_BASED_OUTPATIENT_CLINIC_OR_DEPARTMENT_OTHER): Payer: No Typology Code available for payment source

## 2012-01-26 ENCOUNTER — Encounter (HOSPITAL_BASED_OUTPATIENT_CLINIC_OR_DEPARTMENT_OTHER): Payer: Self-pay | Admitting: *Deleted

## 2012-01-26 DIAGNOSIS — M4800 Spinal stenosis, site unspecified: Secondary | ICD-10-CM | POA: Insufficient documentation

## 2012-01-26 DIAGNOSIS — F3289 Other specified depressive episodes: Secondary | ICD-10-CM | POA: Insufficient documentation

## 2012-01-26 DIAGNOSIS — Z79899 Other long term (current) drug therapy: Secondary | ICD-10-CM | POA: Insufficient documentation

## 2012-01-26 DIAGNOSIS — Z791 Long term (current) use of non-steroidal anti-inflammatories (NSAID): Secondary | ICD-10-CM | POA: Insufficient documentation

## 2012-01-26 DIAGNOSIS — S139XXA Sprain of joints and ligaments of unspecified parts of neck, initial encounter: Secondary | ICD-10-CM | POA: Insufficient documentation

## 2012-01-26 DIAGNOSIS — S39012A Strain of muscle, fascia and tendon of lower back, initial encounter: Secondary | ICD-10-CM

## 2012-01-26 DIAGNOSIS — F329 Major depressive disorder, single episode, unspecified: Secondary | ICD-10-CM | POA: Insufficient documentation

## 2012-01-26 DIAGNOSIS — E785 Hyperlipidemia, unspecified: Secondary | ICD-10-CM | POA: Insufficient documentation

## 2012-01-26 DIAGNOSIS — Z87891 Personal history of nicotine dependence: Secondary | ICD-10-CM | POA: Insufficient documentation

## 2012-01-26 DIAGNOSIS — I10 Essential (primary) hypertension: Secondary | ICD-10-CM | POA: Insufficient documentation

## 2012-01-26 DIAGNOSIS — Y929 Unspecified place or not applicable: Secondary | ICD-10-CM | POA: Insufficient documentation

## 2012-01-26 DIAGNOSIS — M129 Arthropathy, unspecified: Secondary | ICD-10-CM | POA: Insufficient documentation

## 2012-01-26 DIAGNOSIS — Z8679 Personal history of other diseases of the circulatory system: Secondary | ICD-10-CM | POA: Insufficient documentation

## 2012-01-26 DIAGNOSIS — Y9389 Activity, other specified: Secondary | ICD-10-CM | POA: Insufficient documentation

## 2012-01-26 HISTORY — DX: Unspecified osteoarthritis, unspecified site: M19.90

## 2012-01-26 HISTORY — DX: Asymptomatic varicose veins of unspecified lower extremity: I83.90

## 2012-01-26 MED ORDER — IBUPROFEN 600 MG PO TABS: 600.0000 mg | ORAL_TABLET | Freq: Four times a day (QID) | ORAL | Status: DC | PRN
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
## 2012-02-13 ENCOUNTER — Other Ambulatory Visit (HOSPITAL_COMMUNITY): Payer: Self-pay | Admitting: Family Medicine

## 2012-02-13 DIAGNOSIS — R7989 Other specified abnormal findings of blood chemistry: Secondary | ICD-10-CM

## 2012-02-21 ENCOUNTER — Encounter (HOSPITAL_COMMUNITY): Payer: Medicare Other

## 2012-02-21 ENCOUNTER — Encounter: Payer: Self-pay | Admitting: Pulmonary Disease

## 2012-02-28 ENCOUNTER — Encounter (HOSPITAL_COMMUNITY)
Admission: RE | Admit: 2012-02-28 | Discharge: 2012-02-28 | Disposition: A | Discharge status: Home / Self Care | Payer: Medicare Other | Source: Ambulatory Visit | Attending: Family Medicine | Admitting: Family Medicine

## 2012-02-28 DIAGNOSIS — R7989 Other specified abnormal findings of blood chemistry: Secondary | ICD-10-CM

## 2012-02-28 DIAGNOSIS — E213 Hyperparathyroidism, unspecified: Secondary | ICD-10-CM | POA: Insufficient documentation

## 2012-02-28 MED ORDER — TECHNETIUM TC 99M SESTAMIBI GENERIC - CARDIOLITE
25.0000 | Freq: Once | INTRAVENOUS | Status: AC | PRN
  Administered 2012-02-28: 25 via INTRAVENOUS

## 2012-04-22 ENCOUNTER — Encounter (INDEPENDENT_AMBULATORY_CARE_PROVIDER_SITE_OTHER): Payer: Self-pay | Admitting: Surgery

## 2012-04-22 ENCOUNTER — Ambulatory Visit (INDEPENDENT_AMBULATORY_CARE_PROVIDER_SITE_OTHER): Payer: BC Managed Care – HMO | Admitting: Surgery

## 2012-04-22 VITALS — BP 118/86 | HR 70 | Temp 96.5°F | Ht 65.0 in | Wt 197.4 lb

## 2012-04-22 DIAGNOSIS — E210 Primary hyperparathyroidism: Secondary | ICD-10-CM

## 2012-04-22 HISTORY — DX: Primary hyperparathyroidism: E21.0

## 2012-04-22 NOTE — Progress Notes (Signed)
General Surgery [facility]  Chief Complaint  Patient presents with  . New Evaluation    primary hyperparathyroidism - referral from Dr. Maurice Small    HISTORY: The patient is a 72 year old black female referred by her primary care physician with biochemical evidence of primary hyperparathyroidism. Patient was noted on routine laboratory studies to have an elevated serum calcium level of 11.0. Intact PTH level was elevated at 77. Patient underwent a nuclear medicine parathyroid scan which failed to reveal a parathyroid adenoma. Patient is now referred for further evaluation and management of presumed primary hyperparathyroidism.  Patient has had no prior history of head or neck surgery. There is no family history of endocrine disease. She has had no complications from her parathyroid disease. She denies nephrolithiasis. She denies osteopenia or osteoporosis.  Past Medical History  Diagnosis Date  . Hypertension   . Hyperlipidemia   . Depression   . Pedal edema   . Spinal stenosis   . Varicose veins   . Arthritis      Current Outpatient Prescriptions  Medication Sig Dispense Refill  . ALPRAZolam (XANAX) 0.25 MG tablet Take 0.25 mg by mouth at bedtime as needed.      . benazepril-hydrochlorthiazide (LOTENSIN HCT) 10-12.5 MG per tablet       . buPROPion (WELLBUTRIN) 100 MG tablet Take 100 mg by mouth 2 (two) times daily.      . Cyanocobalamin (VITAMIN B 12 PO) Take 1 tablet by mouth daily.      . diclofenac (VOLTAREN) 50 MG EC tablet       . ibuprofen (ADVIL,MOTRIN) 600 MG tablet Take 1 tablet (600 mg total) by mouth every 6 (six) hours as needed for pain.  20 tablet  0  . omeprazole (PRILOSEC) 40 MG capsule Take 1 capsule (40 mg total) by mouth 2 (two) times daily.  60 capsule  1  . sertraline (ZOLOFT) 100 MG tablet Take 100 mg by mouth daily.      . Calcium 600-200 MG-UNIT per tablet Take 1 tablet by mouth daily.      . rosuvastatin (CRESTOR) 5 MG tablet  Take 5 mg by mouth daily.       No current facility-administered medications for this visit.     Allergies  Allergen Reactions  . Ace Inhibitors Cough    Pt unsure of this as an allergy  . Lipitor (Atorvastatin) Other (See Comments)    edema  . Simvastatin Other (See Comments)    Stiffness      Family History  Problem Relation Age of Onset  . Hypertension Mother     deceased  . Lung cancer Father     deceased     History   Social History  . Marital Status: Widowed    Spouse Name: N/A    Number of Children: N/A  . Years of Education: N/A   Occupational History  . retired    Social History Main Topics  . Smoking status: Former Smoker -- 1.00 packs/day for 30 years    Types: Cigarettes    Quit date: 01/08/1988  . Smokeless tobacco: Never Used  . Alcohol Use: Yes     Comment: rare  . Drug Use: No  . Sexually Active: None   Other Topics Concern  . None   Social History Narrative  . None     REVIEW OF SYSTEMS - PERTINENT POSITIVES ONLY: No history of nephrolithiasis. Denies osteopenia or osteoporosis. Patient is anticipating right knee replacement  surgery in the near future.  EXAM: Filed Vitals:   04/22/12 1129  BP: 118/86  Pulse: 70  Temp: 96.5 F (35.8 C)    HEENT: normocephalic; pupils equal and reactive; sclerae clear; dentition fair; mucous membranes moist NECK:  No palpable masses within the thyroid bed; symmetric on extension; no palpable anterior or posterior cervical lymphadenopathy; no supraclavicular masses; no tenderness CHEST: clear to auscultation bilaterally without rales, rhonchi, or wheezes CARDIAC: regular rate and rhythm without significant murmur; peripheral pulses are full EXT:  non-tender without edema; no deformity NEURO: no gross focal deficits; no sign of tremor   LABORATORY RESULTS: See [facility] ([facility]Epic) for most recent results   RADIOLOGY RESULTS: See [facility] ([facility]Epic) for most recent  results   IMPRESSION: Primary hyperparathyroidism  PLAN: The patient and I reviewed the above studies. The nuclear medicine sestamibi scan was negative. Approximately 20% of patients with parathyroid disease have a negative scan.  I am going to obtain a thyroid and parathyroid ultrasound of the neck.  We will arrange for a 25 hydroxy vitamin D level and a 24-hour urine collection for calcium. Once the results of the studies are available, I will contact her with the results. We will then make a decision on how to proceed. She will likely come to surgery for parathyroidectomy at some point.  [name], MD, FACS General & Endocrine Surgery [facility]   Visit Diagnoses: 1. Hyperparathyroidism, primary     Primary Care Physician: Astrid Divine, MD

## 2012-04-22 NOTE — Addendum Note (Signed)
Addended by: Joanette Gula on: 04/22/2012 12:12 PM   Modules accepted: Orders

## 2012-04-22 NOTE — Patient Instructions (Signed)
Parathyroid Hormone This is a test to determine whether PTH levels are responding normally to changes in blood calcium levels. It also helps to distinguish the cause of calcium imbalances, and to evaluate parathyroid function. When calcium blood levels are higher or lower than normal, and when your caregiver may want to determine how well your parathyroid glands are working. Parathyroid hormone (PTH) helps the body maintain stable levels of calcium in the blood. It is part of a 'feedback loop' that includes calcium, PTH, vitamin D, and, to some extent, phosphate and magnesium. Conditions and diseases that disrupt this feedback loop can cause inappropriate elevations or decreases in calcium and PTH levels and lead to symptoms of hypercalcemia (raised blood levels of calcium) or hypocalcemia (low blood levels of calcium).  PTH is produced by four parathyroid glands that are located in the neck beside the thyroid gland. Normally, these glands secrete PTH into the bloodstream in response to low blood calcium levels. Parathyroid hormone then works in three ways to help raise blood calcium levels back to normal. It takes calcium from the body's bone, stimulates the activation of vitamin D in the kidney (which in turn increases the absorption of calcium from the intestines), and suppresses the excretion of calcium in the urine (while encouraging excretion of phosphate). As calcium levels begin to increase in the blood, PTH normally decreases. PREPARATION FOR TEST You should have nothing to eat or drink except for water after midnight on the day of the test or as directed by your caregiver. A blood sample is obtained by inserting a needle into a vein in the arm. NORMAL FINDINGS Conventional Normal  PTH intact (whole)  Assay includes intact PTH  Values (pg/mL)10-65  SI Units (ng/L)10-65  PTH N-terminalN-terminal  Values (pg/mL) 8-24  SI Units (ng/L)8-24  PTH C-terminal  Assay Includes  C-terminal  Values (pg/mL) 50-330  SI Units (ng/L) 50-330  Intact PTH  Midmolecule Ranges for normal findings may vary among different laboratories and hospitals. You should always check with your doctor after having lab work or other tests done to discuss the meaning of your test results and whether your values are considered within normal limits. MEANING OF TEST  Your caregiver will go over the test results with you and discuss the importance and meaning of your results, as well as treatment options and the need for additional tests if necessary. OBTAINING THE TEST RESULTS  It is your responsibility to obtain your test results. Ask the lab or department performing the test when and how you will get your results. Document Released: 01/27/2004 Document Revised: 03/18/2011 Document Reviewed: 12/06/2007 [employer] Patient Information 2013 [employer].  

## 2012-04-24 ENCOUNTER — Ambulatory Visit
Admission: RE | Admit: 2012-04-24 | Discharge: 2012-04-24 | Disposition: A | Discharge status: Home / Self Care | Payer: Medicare Other | Source: Ambulatory Visit | Attending: Surgery | Admitting: Surgery

## 2012-04-24 DIAGNOSIS — E210 Primary hyperparathyroidism: Secondary | ICD-10-CM

## 2012-04-27 ENCOUNTER — Encounter (INDEPENDENT_AMBULATORY_CARE_PROVIDER_SITE_OTHER): Payer: Self-pay

## 2012-04-27 ENCOUNTER — Telehealth (INDEPENDENT_AMBULATORY_CARE_PROVIDER_SITE_OTHER): Payer: Self-pay

## 2012-04-27 NOTE — Telephone Encounter (Signed)
Labs and u/s result to Dr Gerrit Friends to review and advise pts follow up plan.

## 2012-04-29 ENCOUNTER — Encounter (INDEPENDENT_AMBULATORY_CARE_PROVIDER_SITE_OTHER): Payer: Self-pay

## 2012-04-30 ENCOUNTER — Telehealth (INDEPENDENT_AMBULATORY_CARE_PROVIDER_SITE_OTHER): Payer: Self-pay | Admitting: Surgery

## 2012-04-30 ENCOUNTER — Other Ambulatory Visit (INDEPENDENT_AMBULATORY_CARE_PROVIDER_SITE_OTHER): Payer: Self-pay | Admitting: Surgery

## 2012-04-30 DIAGNOSIS — E210 Primary hyperparathyroidism: Secondary | ICD-10-CM

## 2012-04-30 MED ORDER — ERGOCALCIFEROL 1.25 MG (50000 UT) PO CAPS: 50000.0000 [IU] | ORAL_CAPSULE | ORAL | Status: DC

## 2012-04-30 NOTE — Telephone Encounter (Signed)
Called results to patient and her oldest granddaughter.  Sestamibi does not show adenoma.  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  Vit D level is low at 22.  Urine calcium is LOW at 78.  I recommend 4 months of Ergocalciferol 50,000 Units weekly.  Will repeat calcium and PTH levels in 6 months.  No plans for parathyroid surgery at this time.  Will re-evaluate in the fall.  Would proceed with evaluation for total knee replacement.  No need to delay.  [name], MD, [facility] Office: [phone]

## 2012-05-04 ENCOUNTER — Other Ambulatory Visit (INDEPENDENT_AMBULATORY_CARE_PROVIDER_SITE_OTHER): Payer: Self-pay

## 2012-05-04 DIAGNOSIS — E213 Hyperparathyroidism, unspecified: Secondary | ICD-10-CM

## 2012-05-14 ENCOUNTER — Telehealth (INDEPENDENT_AMBULATORY_CARE_PROVIDER_SITE_OTHER): Payer: Self-pay

## 2012-05-14 NOTE — Telephone Encounter (Signed)
The daughter called to request surgery for parathyroid be scheduled.  I read the last telephone note by Dr Gerrit Friends to her where he said reevaluate in the fall and proceed with the knee surgery.  The daughter states she just saw the pcp and her knee is really bothering her.  They are saying they can't do the knee until the parathyroid is taken care of.  Please call.

## 2012-05-14 NOTE — Telephone Encounter (Signed)
Pts daughter advised Dr Gerrit Friends will not be available to review this request until next week. I advised her the request [id number] has been forwarded to Dr Gerrit Friends to review and call pt with response.

## 2012-05-20 ENCOUNTER — Telehealth (INDEPENDENT_AMBULATORY_CARE_PROVIDER_SITE_OTHER): Payer: Self-pay | Admitting: Surgery

## 2012-05-20 ENCOUNTER — Other Ambulatory Visit (INDEPENDENT_AMBULATORY_CARE_PROVIDER_SITE_OTHER): Payer: Self-pay | Admitting: Surgery

## 2012-05-20 DIAGNOSIS — E210 Primary hyperparathyroidism: Secondary | ICD-10-CM

## 2012-05-20 NOTE — Telephone Encounter (Signed)
Patient with primary hyperparathyroidism.  On Vitamin D treatment.  Patient and family wish to proceed with surgery at this time.  Will schedule.  The risks and benefits of the procedure have been discussed at length with the patient.  The patient understands the proposed procedure, potential alternative treatments, and the course of recovery to be expected.  All of the patient's questions have been answered at this time.  The patient wishes to proceed with surgery.  [name], MD, [facility] Office: [phone]

## 2012-05-28 ENCOUNTER — Telehealth (INDEPENDENT_AMBULATORY_CARE_PROVIDER_SITE_OTHER): Payer: Self-pay

## 2012-05-28 NOTE — Telephone Encounter (Signed)
OR posting sheet to Debbie in scheduling.

## 2012-06-05 ENCOUNTER — Encounter (HOSPITAL_COMMUNITY): Payer: Self-pay | Admitting: Pharmacy Technician

## 2012-06-09 ENCOUNTER — Other Ambulatory Visit (HOSPITAL_COMMUNITY): Payer: Self-pay | Admitting: Surgery

## 2012-06-09 ENCOUNTER — Encounter (HOSPITAL_COMMUNITY): Payer: Self-pay

## 2012-06-09 ENCOUNTER — Ambulatory Visit (HOSPITAL_COMMUNITY)
Admission: RE | Admit: 2012-06-09 | Discharge: 2012-06-09 | Disposition: A | Discharge status: Home / Self Care | Payer: Medicare Other | Source: Ambulatory Visit | Attending: Surgery | Admitting: Surgery

## 2012-06-09 ENCOUNTER — Encounter (HOSPITAL_COMMUNITY)
Admission: RE | Admit: 2012-06-09 | Discharge: 2012-06-09 | Disposition: A | Discharge status: Home / Self Care | Payer: Medicare Other | Source: Ambulatory Visit | Attending: Surgery | Admitting: Surgery

## 2012-06-09 DIAGNOSIS — I10 Essential (primary) hypertension: Secondary | ICD-10-CM | POA: Insufficient documentation

## 2012-06-09 DIAGNOSIS — Z01810 Encounter for preprocedural cardiovascular examination: Secondary | ICD-10-CM | POA: Insufficient documentation

## 2012-06-09 DIAGNOSIS — Z01812 Encounter for preprocedural laboratory examination: Secondary | ICD-10-CM | POA: Insufficient documentation

## 2012-06-09 DIAGNOSIS — Z01818 Encounter for other preprocedural examination: Secondary | ICD-10-CM | POA: Insufficient documentation

## 2012-06-09 LAB — SURGICAL PCR SCREEN: Staphylococcus aureus: NEGATIVE

## 2012-06-09 LAB — CBC
HCT: 36.2 % (ref 36.0–46.0)
MCHC: 34 g/dL (ref 30.0–36.0)
Platelets: 255 10*3/uL (ref 150–400)
RDW: 13.4 % (ref 11.5–15.5)

## 2012-06-09 LAB — BASIC METABOLIC PANEL
BUN: 20 mg/dL (ref 6–23)
Calcium: 10.9 mg/dL — ABNORMAL HIGH (ref 8.4–10.5)
Chloride: 103 mEq/L (ref 96–112)
Creatinine, Ser: 0.82 mg/dL (ref 0.50–1.10)
GFR calc Af Amer: 80 mL/min — ABNORMAL LOW (ref >90)

## 2012-06-09 NOTE — Patient Instructions (Addendum)
20 Susan [name]  06/09/2012   Your procedure is scheduled on: 06-16-2012  Report to [name] at 730 AM.  Call this number if you have problems the morning of surgery [phone]   Remember:   Do not eat food or drink liquids :After Midnight.     Take these medicines the morning of surgery with A SIP OF WATER: bupropion, zoloft                                SEE [name] PREPARING FOR SURGERY SHEET   Do not wear jewelry, make-up or nail polish.  Do not wear lotions, powders, or perfumes. You may wear deodorant.   Men may shave face and neck.  Do not bring valuables to the hospital.  Contacts, dentures or bridgework may not be worn into surgery.  Leave suitcase in the car. After surgery it may be brought to your room.  For patients admitted to the hospital, checkout time is 11:00 AM the day of discharge.   Patients discharged the day of surgery will not be allowed to drive home.  Name and phone number of your driver:  Special Instructions: N/A   Please read over the following fact sheets that you were given: MRSA Information.  Call [name] pre op nurse if needed [phone]    FAILURE TO FOLLOW THESE INSTRUCTIONS MAY RESULT IN THE CANCELLATION OF YOUR SURGERY. PATIENT SIGNATURE___________________________________________

## 2012-06-09 NOTE — Progress Notes (Signed)
06/09/12 1404  OBSTRUCTIVE SLEEP APNEA  Have you ever been diagnosed with sleep apnea through a sleep study? No  Do you snore loudly (loud enough to be heard through closed doors)?  1  Do you often feel tired, fatigued, or sleepy during the daytime? 0  Has anyone observed you stop breathing during your sleep? 1  Do you have, or are you being treated for high blood pressure? 1  BMI more than 35 kg/m2? 1  Age over 74 years old? 0  Neck circumference greater than 40 cm/18 inches? 0  Gender: 0  Obstructive Sleep Apnea Score 4  Score 4 or greater  Results sent to PCP

## 2012-06-11 NOTE — Progress Notes (Signed)
Spoke with dr Leta Jungling made aware 04-24-2011 ekg results epic and 06-09-2012 ekg results epic, pt ok for surgery.

## 2012-06-16 ENCOUNTER — Observation Stay (HOSPITAL_COMMUNITY)
Admission: RE | Admit: 2012-06-16 | Discharge: 2012-06-17 | Disposition: A | Discharge status: Home / Self Care | Payer: Medicare Other | Source: Ambulatory Visit | Attending: Surgery | Admitting: Surgery

## 2012-06-16 ENCOUNTER — Encounter (HOSPITAL_COMMUNITY): Payer: Self-pay | Admitting: Anesthesiology

## 2012-06-16 ENCOUNTER — Encounter (HOSPITAL_COMMUNITY): Payer: Self-pay | Admitting: *Deleted

## 2012-06-16 ENCOUNTER — Ambulatory Visit (HOSPITAL_COMMUNITY): Payer: Medicare Other | Admitting: Anesthesiology

## 2012-06-16 ENCOUNTER — Encounter (HOSPITAL_COMMUNITY)
Admission: RE | Disposition: A | Discharge status: Home / Self Care | Payer: Self-pay | Source: Ambulatory Visit | Attending: Surgery

## 2012-06-16 DIAGNOSIS — E210 Primary hyperparathyroidism: Principal | ICD-10-CM | POA: Insufficient documentation

## 2012-06-16 DIAGNOSIS — Z79899 Other long term (current) drug therapy: Secondary | ICD-10-CM | POA: Insufficient documentation

## 2012-06-16 DIAGNOSIS — I10 Essential (primary) hypertension: Secondary | ICD-10-CM | POA: Insufficient documentation

## 2012-06-16 DIAGNOSIS — E785 Hyperlipidemia, unspecified: Secondary | ICD-10-CM | POA: Insufficient documentation

## 2012-06-16 HISTORY — PX: PARATHYROIDECTOMY: SHX19

## 2012-06-16 SURGERY — PARATHYROIDECTOMY
Anesthesia: General | Site: Throat | Wound class: Clean

## 2012-06-16 MED ORDER — VALSARTAN-HYDROCHLOROTHIAZIDE 160-12.5 MG PO TABS: 1.0000 | ORAL_TABLET | Freq: Every morning | ORAL | Status: DC

## 2012-06-16 MED ORDER — HYDRALAZINE HCL 20 MG/ML IJ SOLN
INTRAMUSCULAR | Status: DC | PRN
  Administered 2012-06-16: 5 mg via INTRAVENOUS
  Administered 2012-06-16: 10 mg via INTRAVENOUS

## 2012-06-16 MED ORDER — IBUPROFEN 800 MG PO TABS: 400.0000 mg | ORAL_TABLET | Freq: Four times a day (QID) | ORAL | Status: DC | PRN

## 2012-06-16 MED ORDER — HYDROMORPHONE HCL PF 1 MG/ML IJ SOLN
1.0000 mg | INTRAMUSCULAR | Status: DC | PRN
  Administered 2012-06-16 – 2012-06-17 (×4): 1 mg via INTRAVENOUS
  Filled 2012-06-16 (×4): qty 1

## 2012-06-16 MED ORDER — ROCURONIUM BROMIDE 100 MG/10ML IV SOLN
INTRAVENOUS | Status: DC | PRN
  Administered 2012-06-16: 35 mg via INTRAVENOUS
  Administered 2012-06-16: 5 mg via INTRAVENOUS

## 2012-06-16 MED ORDER — HYDROCHLOROTHIAZIDE 12.5 MG PO CAPS
12.5000 mg | ORAL_CAPSULE | Freq: Every day | ORAL | Status: DC
  Administered 2012-06-16: 12.5 mg via ORAL
  Filled 2012-06-16 (×2): qty 1

## 2012-06-16 MED ORDER — MIDAZOLAM HCL 5 MG/5ML IJ SOLN
INTRAMUSCULAR | Status: DC | PRN
  Administered 2012-06-16: 1 mg via INTRAVENOUS

## 2012-06-16 MED ORDER — BUPROPION HCL 100 MG PO TABS
100.0000 mg | ORAL_TABLET | Freq: Two times a day (BID) | ORAL | Status: DC
  Administered 2012-06-16: 100 mg via ORAL
  Filled 2012-06-16 (×3): qty 1

## 2012-06-16 MED ORDER — ONDANSETRON HCL 4 MG/2ML IJ SOLN: 4.0000 mg | Freq: Four times a day (QID) | INTRAMUSCULAR | Status: DC | PRN

## 2012-06-16 MED ORDER — ALPRAZOLAM 0.25 MG PO TABS: 0.2500 mg | ORAL_TABLET | Freq: Every evening | ORAL | Status: DC | PRN

## 2012-06-16 MED ORDER — LACTATED RINGERS IV SOLN: INTRAVENOUS | Status: DC

## 2012-06-16 MED ORDER — IRBESARTAN 150 MG PO TABS
150.0000 mg | ORAL_TABLET | Freq: Every day | ORAL | Status: DC
  Administered 2012-06-16: 150 mg via ORAL
  Filled 2012-06-16 (×2): qty 1

## 2012-06-16 MED ORDER — FENTANYL CITRATE 0.05 MG/ML IJ SOLN
INTRAMUSCULAR | Status: DC | PRN
  Administered 2012-06-16: 100 ug via INTRAVENOUS
  Administered 2012-06-16: 50 ug via INTRAVENOUS
  Administered 2012-06-16: 100 ug via INTRAVENOUS
  Administered 2012-06-16: 50 ug via INTRAVENOUS

## 2012-06-16 MED ORDER — SERTRALINE HCL 100 MG PO TABS
100.0000 mg | ORAL_TABLET | Freq: Every morning | ORAL | Status: DC
  Administered 2012-06-16: 100 mg via ORAL
  Filled 2012-06-16 (×2): qty 1

## 2012-06-16 MED ORDER — PROPOFOL 10 MG/ML IV BOLUS
INTRAVENOUS | Status: DC | PRN
  Administered 2012-06-16: 50 mg via INTRAVENOUS
  Administered 2012-06-16: 40 mg via INTRAVENOUS
  Administered 2012-06-16: 160 mg via INTRAVENOUS

## 2012-06-16 MED ORDER — LIDOCAINE HCL 4 % MT SOLN
OROMUCOSAL | Status: DC | PRN
  Administered 2012-06-16: 4 mL via TOPICAL

## 2012-06-16 MED ORDER — ONDANSETRON HCL 4 MG PO TABS: 4.0000 mg | ORAL_TABLET | Freq: Four times a day (QID) | ORAL | Status: DC | PRN

## 2012-06-16 MED ORDER — KCL IN DEXTROSE-NACL 30-5-0.45 MEQ/L-%-% IV SOLN
INTRAVENOUS | Status: DC
  Administered 2012-06-16: 15:00:00 via INTRAVENOUS
  Filled 2012-06-16 (×2): qty 1000

## 2012-06-16 MED ORDER — SUCCINYLCHOLINE CHLORIDE 20 MG/ML IJ SOLN
INTRAMUSCULAR | Status: DC | PRN
  Administered 2012-06-16: 40 mg via INTRAVENOUS
  Administered 2012-06-16: 100 mg via INTRAVENOUS

## 2012-06-16 MED ORDER — HYDROMORPHONE HCL PF 1 MG/ML IJ SOLN
0.2500 mg | INTRAMUSCULAR | Status: DC | PRN
  Administered 2012-06-16 (×3): 0.5 mg via INTRAVENOUS

## 2012-06-16 MED ORDER — GLYCOPYRROLATE 0.2 MG/ML IJ SOLN
INTRAMUSCULAR | Status: DC | PRN
  Administered 2012-06-16: 0.6 mg via INTRAVENOUS
  Administered 2012-06-16: 0.2 mg via INTRAVENOUS

## 2012-06-16 MED ORDER — ONDANSETRON HCL 4 MG/2ML IJ SOLN
INTRAMUSCULAR | Status: DC | PRN
  Administered 2012-06-16: 4 mg via INTRAVENOUS

## 2012-06-16 MED ORDER — LACTATED RINGERS IV SOLN
INTRAVENOUS | Status: DC
  Administered 2012-06-16: 1000 mL via INTRAVENOUS
  Administered 2012-06-16: 11:00:00 via INTRAVENOUS

## 2012-06-16 MED ORDER — LABETALOL HCL 5 MG/ML IV SOLN
INTRAVENOUS | Status: DC | PRN
  Administered 2012-06-16 (×2): 5 mg via INTRAVENOUS

## 2012-06-16 MED ORDER — HYDROCODONE-ACETAMINOPHEN 5-325 MG PO TABS
1.0000 | ORAL_TABLET | ORAL | Status: DC | PRN
  Administered 2012-06-17: 2 via ORAL
  Filled 2012-06-16: qty 2

## 2012-06-16 MED ORDER — NEOSTIGMINE METHYLSULFATE 1 MG/ML IJ SOLN
INTRAMUSCULAR | Status: DC | PRN
  Administered 2012-06-16: 3 mg via INTRAVENOUS

## 2012-06-16 MED ORDER — LIDOCAINE HCL (CARDIAC) 20 MG/ML IV SOLN
INTRAVENOUS | Status: DC | PRN
  Administered 2012-06-16: 50 mg via INTRAVENOUS

## 2012-06-16 MED ORDER — CEFAZOLIN SODIUM-DEXTROSE 2-3 GM-% IV SOLR
2.0000 g | INTRAVENOUS | Status: AC
  Administered 2012-06-16: 2 g via INTRAVENOUS

## 2012-06-16 SURGICAL SUPPLY — 40 items
ATTRACTOMAT 16X20 MAGNETIC DRP (DRAPES) ×2 IMPLANT
BENZOIN TINCTURE PRP APPL 2/3 (GAUZE/BANDAGES/DRESSINGS) ×2 IMPLANT
BLADE HEX COATED 2.75 (ELECTRODE) ×2 IMPLANT
BLADE SURG 15 STRL LF DISP TIS (BLADE) ×1 IMPLANT
BLADE SURG 15 STRL SS (BLADE) ×1
CANISTER SUCTION 2500CC (MISCELLANEOUS) ×2 IMPLANT
CHLORAPREP W/TINT 10.5 ML (MISCELLANEOUS) ×2 IMPLANT
CLIP TI MEDIUM 6 (CLIP) ×4 IMPLANT
CLIP TI WIDE RED SMALL 6 (CLIP) ×8 IMPLANT
CLOSURE STERI-STRIP 1/4X4 (GAUZE/BANDAGES/DRESSINGS) ×2 IMPLANT
CLOTH BEACON ORANGE TIMEOUT ST (SAFETY) ×2 IMPLANT
DISSECTOR ROUND CHERRY 3/8 STR (MISCELLANEOUS) IMPLANT
DRAPE PED LAPAROTOMY (DRAPES) ×2 IMPLANT
DRESSING SURGICEL FIBRLLR 1X2 (HEMOSTASIS) ×1 IMPLANT
DRSG SURGICEL FIBRILLAR 1X2 (HEMOSTASIS) ×2
ELECT REM PT RETURN 9FT ADLT (ELECTROSURGICAL) ×2
ELECTRODE REM PT RTRN 9FT ADLT (ELECTROSURGICAL) ×1 IMPLANT
GAUZE SPONGE 4X4 16PLY XRAY LF (GAUZE/BANDAGES/DRESSINGS) ×2 IMPLANT
GLOVE SURG ORTHO 8.0 STRL STRW (GLOVE) ×2 IMPLANT
GOWN STRL NON-REIN LRG LVL3 (GOWN DISPOSABLE) ×2 IMPLANT
GOWN STRL REIN XL XLG (GOWN DISPOSABLE) ×4 IMPLANT
KIT BASIN OR (CUSTOM PROCEDURE TRAY) ×2 IMPLANT
NEEDLE HYPO 25X1 1.5 SAFETY (NEEDLE) ×2 IMPLANT
NS IRRIG 1000ML POUR BTL (IV SOLUTION) ×2 IMPLANT
PACK BASIC VI WITH GOWN DISP (CUSTOM PROCEDURE TRAY) ×2 IMPLANT
PENCIL BUTTON HOLSTER BLD 10FT (ELECTRODE) ×2 IMPLANT
SPONGE GAUZE 4X4 12PLY (GAUZE/BANDAGES/DRESSINGS) ×2 IMPLANT
STAPLER VISISTAT 35W (STAPLE) ×2 IMPLANT
STRIP CLOSURE SKIN 1/2X4 (GAUZE/BANDAGES/DRESSINGS) ×2 IMPLANT
SUT MNCRL AB 4-0 PS2 18 (SUTURE) ×2 IMPLANT
SUT SILK 2 0 (SUTURE) ×1
SUT SILK 2-0 18XBRD TIE 12 (SUTURE) ×1 IMPLANT
SUT SILK 3 0 (SUTURE)
SUT SILK 3-0 18XBRD TIE 12 (SUTURE) IMPLANT
SUT VIC AB 3-0 SH 18 (SUTURE) ×4 IMPLANT
SYR BULB IRRIGATION 50ML (SYRINGE) ×2 IMPLANT
SYR CONTROL 10ML LL (SYRINGE) ×2 IMPLANT
TAPE CLOTH SURG 4X10 WHT LF (GAUZE/BANDAGES/DRESSINGS) ×2 IMPLANT
TOWEL OR 17X26 10 PK STRL BLUE (TOWEL DISPOSABLE) ×2 IMPLANT
YANKAUER SUCT BULB TIP 10FT TU (MISCELLANEOUS) ×2 IMPLANT

## 2012-06-16 NOTE — H&P (Signed)
Susan [name] is an 72 y.o. female.    General Surgery [facility]  Chief Complaint: primary hyperparathyroidism  HPI: The patient is a 72 year old black female referred by her primary care physician with biochemical evidence of primary hyperparathyroidism. Patient was noted on routine laboratory studies to have an elevated serum calcium level of 11.0. Intact PTH level was elevated at 77. Patient underwent a nuclear medicine parathyroid scan which failed to reveal a parathyroid adenoma. Patient is now referred for further evaluation and management of presumed primary hyperparathyroidism.  Patient has had no prior history of head or neck surgery. There is no family history of endocrine disease. She has had no complications from her parathyroid disease. She denies nephrolithiasis. She denies osteopenia or osteoporosis.  Vitamin D level was low and she has been on supplementation.  24 hour urine calcium was slightly low at 78.  USN exam shows a possible adenoma posterior to the left thyroid lobe.  Past Medical History  Diagnosis Date  . Hypertension   . Hyperlipidemia   . Depression   . Pedal edema     none in last 6 months  . Spinal stenosis   . Varicose veins   . Arthritis   . Primary hyperparathyroidism     Past Surgical History  Procedure Laterality Date  . Cholecystectomy  yrs ago  . Iud removed  30 yrs ago    Family History  Problem Relation Age of Onset  . Hypertension Mother     deceased  . Lung cancer Father     deceased   Social History:  reports that she quit smoking about 24 years ago. Her smoking use included Cigarettes. She has a 30 pack-year smoking history. She has never used smokeless tobacco. She reports that  drinks alcohol. She reports that she does not use illicit drugs.  Allergies:  Allergies  Allergen Reactions  . Ace Inhibitors Cough    Pt unsure of this as an allergy  . Lipitor (Atorvastatin) Other (See Comments)    edema  .  Simvastatin Other (See Comments)    Stiffness     Medications Prior to Admission  Medication Sig Dispense Refill  . buPROPion (WELLBUTRIN) 100 MG tablet Take 100 mg by mouth 2 (two) times daily.      Marland Kitchen ibuprofen (ADVIL,MOTRIN) 200 MG tablet Take 400 mg by mouth every 6 (six) hours as needed for pain.      Marland Kitchen OVER THE COUNTER MEDICATION Eye vitamin 3 tabs q day      . sertraline (ZOLOFT) 100 MG tablet Take 100 mg by mouth every morning.       . Vitamin D, Ergocalciferol, (DRISDOL) 50000 UNITS CAPS Take 50,000 Units by mouth every 7 (seven) days.      . ALPRAZolam (XANAX) 0.25 MG tablet Take 0.25 mg by mouth at bedtime as needed for sleep.       . calcium-vitamin D (OSCAL WITH D) 500-200 MG-UNIT per tablet Take 1 tablet by mouth daily.      . Multiple Vitamin (MULTIVITAMIN WITH MINERALS) TABS Take 1 tablet by mouth daily.      . valsartan-hydrochlorothiazide (DIOVAN-HCT) 160-12.5 MG per tablet Take 1 tablet by mouth every morning.      . vitamin B-12 (CYANOCOBALAMIN) 100 MCG tablet Take 100 mcg by mouth daily.        No results found for this or any previous visit (from the past 48 hour(s)). No results found.  Review of Systems  Constitutional: Positive for malaise/fatigue.  HENT: Negative.   Eyes: Negative.   Respiratory: Negative.   Cardiovascular: Negative.   Gastrointestinal: Negative.   Genitourinary: Negative.   Musculoskeletal: Negative.   Skin: Negative.   Neurological: Negative.   Endo/Heme/Allergies: Negative.   Psychiatric/Behavioral: Negative.     Blood pressure 154/74, pulse 71, temperature 98 F (36.7 C), temperature source Oral, resp. rate 18, SpO2 93.00%. Physical Exam  Constitutional: She is oriented to person, place, and time. She appears well-developed and well-nourished. No distress.  HENT:  Head: Normocephalic and atraumatic.  Right Ear: External ear normal.  Left Ear: External ear normal.  Mouth/Throat: Oropharynx is clear and moist.  Eyes:  Conjunctivae are normal. Pupils are equal, round, and reactive to light. No scleral icterus.  Neck: Normal range of motion. Neck supple. No tracheal deviation present. No thyromegaly present.  Cardiovascular: Normal rate, regular rhythm and normal heart sounds.   Respiratory: Effort normal and breath sounds normal. She has no wheezes.  GI: Soft. Bowel sounds are normal. She exhibits no distension.  Musculoskeletal: Normal range of motion. She exhibits no edema.  Lymphadenopathy:    She has no cervical adenopathy.  Neurological: She is alert and oriented to person, place, and time.  Skin: Skin is warm and dry.  Psychiatric: She has a normal mood and affect. Her behavior is normal.     Assessment/Plan Primary hyperparathyroidism  Plan neck exploration and parathyroidectomy  The risks and benefits of the procedure have been discussed at length with the patient.  The patient understands the proposed procedure, potential alternative treatments, and the course of recovery to be expected.  All of the patient's questions have been answered at this time.  The patient wishes to proceed with surgery.  [name], MD, [facility] Office: [phone]    [name] M 06/16/2012, 10:11 AM

## 2012-06-16 NOTE — Anesthesia Postprocedure Evaluation (Signed)
  Anesthesia Post-op Note  Patient: Susan [name]  Procedure(s) Performed: Procedure(s) (LRB): NECK EXPLORATION AND LEFT SUPERIOR PARATHYROIDECTOMY (N/A)  Patient Location: PACU  Anesthesia Type: General  Level of Consciousness: awake and alert   Airway and Oxygen Therapy: Patient Spontanous Breathing  Post-op Pain: mild  Post-op Assessment: Post-op Vital signs reviewed, Patient's Cardiovascular Status Stable, Respiratory Function Stable, Patent Airway and No signs of Nausea or vomiting  Last Vitals:  Filed Vitals:   06/16/12 1450  BP: 132/63  Pulse: 60  Temp: 36.6 C  Resp: 20    Post-op Vital Signs: stable   Complications: No apparent anesthesia complications

## 2012-06-16 NOTE — Brief Op Note (Signed)
06/16/2012  11:53 AM  PATIENT:  Irineo Axon  72 y.o. female  PRE-OPERATIVE DIAGNOSIS:  Primary hyperparathyroidism  POST-OPERATIVE DIAGNOSIS:  same  PROCEDURE:  Procedure(s): NECK EXPLORATION AND LEFT SUPERIOR PARATHYROIDECTOMY (N/A)  SURGEON:  Surgeon(s) and Role:    * [name], MD - Primary    * Valarie Merino, MD - Assisting  ANESTHESIA:   general  EBL:  Total I/O In: 1000 [I.V.:1000] Out: -   BLOOD ADMINISTERED:none  DRAINS: none   LOCAL MEDICATIONS USED:  NONE  SPECIMEN:  Excision  DISPOSITION OF SPECIMEN:  PATHOLOGY  COUNTS:  YES  TOURNIQUET:  * No tourniquets in log *  DICTATION: .Other Dictation: Dictation Number [id number]  PLAN OF CARE: Admit for overnight observation  PATIENT DISPOSITION:  PACU - hemodynamically stable.   Delay start of Pharmacological VTE agent (>24hrs) due to surgical blood loss or risk of bleeding: yes  [name], MD, FACS General & Endocrine Surgery [facility] Office: [phone]

## 2012-06-16 NOTE — Anesthesia Preprocedure Evaluation (Addendum)
Anesthesia Evaluation  Patient identified by MRN, date of birth, ID band Patient awake    Reviewed: Allergy & Precautions, H&P , NPO status , Patient's Chart, lab work & pertinent test results  Airway Mallampati: II TM Distance: >3 FB Neck ROM: full    Dental no notable dental hx. (+) Teeth Intact and Dental Advisory Given   Pulmonary neg pulmonary ROS,  breath sounds clear to auscultation  Pulmonary exam normal       Cardiovascular Exercise Tolerance: Good hypertension, Pt. on medications negative cardio ROS  Rhythm:regular Rate:Normal  LAFB   Neuro/Psych Depression Spinal stenosis negative neurological ROS  negative psych ROS   GI/Hepatic negative GI ROS, Neg liver ROS,   Endo/Other  negative endocrine ROS  Renal/GU negative Renal ROS  negative genitourinary   Musculoskeletal   Abdominal   Peds  Hematology negative hematology ROS (+)   Anesthesia Other Findings   Reproductive/Obstetrics negative OB ROS                          Anesthesia Physical Anesthesia Plan  ASA: II  Anesthesia Plan: General   Post-op Pain Management:    Induction: Intravenous  Airway Management Planned: Oral ETT  Additional Equipment:   Intra-op Plan:   Post-operative Plan: Extubation in OR  Informed Consent: I have reviewed the patients History and Physical, chart, labs and discussed the procedure including the risks, benefits and alternatives for the proposed anesthesia with the patient or authorized representative who has indicated his/her understanding and acceptance.   Dental Advisory Given  Plan Discussed with: CRNA and Surgeon  Anesthesia Plan Comments:         Anesthesia Quick Evaluation

## 2012-06-16 NOTE — Progress Notes (Signed)
Dr. Ewell made aware of patient's blood pressures. 

## 2012-06-16 NOTE — Transfer of Care (Signed)
Immediate Anesthesia Transfer of Care Note  Patient: Susan [name]  Procedure(s) Performed: Procedure(s): NECK EXPLORATION AND LEFT SUPERIOR PARATHYROIDECTOMY (N/A)  Patient Location: PACU  Anesthesia Type:General  Level of Consciousness: awake and alert   Airway & Oxygen Therapy: Patient Spontanous Breathing and Patient connected to face mask oxygen  Post-op Assessment: Report given to PACU RN and Post -op Vital signs reviewed and stable  Post vital signs: Reviewed and stable  Complications: No apparent anesthesia complications

## 2012-06-17 ENCOUNTER — Encounter (HOSPITAL_COMMUNITY): Payer: Self-pay | Admitting: Surgery

## 2012-06-17 LAB — BASIC METABOLIC PANEL
BUN: 9 mg/dL (ref 6–23)
Calcium: 10 mg/dL (ref 8.4–10.5)
GFR calc Af Amer: >90 mL/min (ref >90)
GFR calc non Af Amer: 80 mL/min — ABNORMAL LOW (ref >90)
Potassium: 3.8 mEq/L (ref 3.5–5.1)
Sodium: 135 mEq/L (ref 135–145)

## 2012-06-17 MED ORDER — HYDROCODONE-ACETAMINOPHEN 5-325 MG PO TABS: 1.0000 | ORAL_TABLET | ORAL | Status: DC | PRN

## 2012-06-17 NOTE — Care Management Note (Signed)
    Page 1 of 1   06/17/2012     10:53:41 AM   CARE MANAGEMENT NOTE 06/17/2012  Patient:  Susan [name], Susan [name]   Account Number:  [id number]  Date Initiated:  06/17/2012  Documentation initiated by:  Lorenda Ishihara  Subjective/Objective Assessment:   72 yo female admitted s/p neck exploration and parathyroidectomy. PTA lived at home with daughter.     Action/Plan:   Home when stable   Anticipated DC Date:  06/17/2012   Anticipated DC Plan:  HOME/SELF CARE      DC Planning Services  CM consult      Choice offered to / List presented to:             Status of service:  Completed, signed off Medicare Important Message given?   (If response is "NO", the following Medicare IM given date fields will be blank) Date Medicare IM given:   Date Additional Medicare IM given:    Discharge Disposition:  HOME/SELF CARE  Per UR Regulation:  Reviewed for med. necessity/level of care/duration of stay  If discussed at Long Length of Stay Meetings, dates discussed:    Comments:

## 2012-06-17 NOTE — Op Note (Signed)
Susan [name], PORTO NO.:  [id number]  MEDICAL RECORD NO.:  [id number]  LOCATION:  1524                         FACILITY:  [facility]  PHYSICIAN:  [name], MD      DATE OF BIRTH:  1940/01/30  DATE OF PROCEDURE:  06/16/2012                               OPERATIVE REPORT   PREOPERATIVE DIAGNOSIS:  Primary hyperparathyroidism.  POSTOPERATIVE DIAGNOSIS:  Primary hyperparathyroidism.  PROCEDURE: 1. Neck exploration. 2. Left superior parathyroidectomy.  SURGEON:  [name], MD, FACS  ASSISTANT:  Thornton Park. Daphine Deutscher, MD, FACS  ANESTHESIA:  General per Dr. Ronelle Nigh.  ESTIMATED BLOOD LOSS:  Minimal.  PREPARATION:  ChloraPrep.  COMPLICATIONS:  None.  INDICATIONS:  The patient is a 72 year old black female referred by her primary care physician with biochemical evidence of primary hyperparathyroidism.  The patient has had an elevated serum calcium level of 11.0 with an elevated intact PTH level of 77.  Nuclear medicine parathyroid scan failed to reveal a parathyroid adenoma.  Ultrasound of the neck revealed a small nodular density, which appears to be posterior to the left thyroid lobe, which may represent an adenoma.  The patient has been on vitamin D therapy.  24-hour urine calcium level was low. The patient now comes to Surgery for neck exploration and parathyroidectomy.  BODY OF REPORT:  Procedure was done in OR #11 at the [facility].  The patient was brought to the operating room, placed in a supine position on the operating room table.  Following administration of general anesthesia, the patient was positioned and then prepped and draped in the usual strict aseptic fashion.  After ascertaining that an adequate level of anesthesia had been achieved, a Kocher incision was made with a #15 blade.  Dissection was carried through subcutaneous tissues and platysma.  Hemostasis was obtained with electrocautery.  Skin flaps were  elevated cephalad and caudad from the thyroid notch to the sternal notch.  A Mahorner self-retaining retractor was placed for exposure.  Strap muscles were incised in the midline. Dissection was begun on the left side.  Left thyroid lobe was gently mobilized.  Venous tributaries were divided between small Ligaclips. Gland was rolled anteriorly.  The thyrothymic tract was opened and explored and contains only adipose tissue.  Esophagus was identified. On the posterior aspect of the thyroid lobe, just above the level of the inferior thyroid artery was an enlarged parathyroid gland.  This measures a little over 1 cm in dimension.  It was gently dissected out. Vascular pedicle was divided between small Ligaclips.  The entire gland was excised.  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  Further exploration in the left neck fails to reveal any additional parathyroid tissue.  Dry pack was placed in the left neck.  The right thyroid lobe was mobilized.  It was slightly larger than the left.  It contains some soft nodules and possibly a posteriorly located cyst.  Exploration fails to reveal any additional parathyroid tissue. Thyrothymic tract was opened and also does not contain evidence of parathyroid tissue.  Based on frozen section data, decision was made  to discontinue dissection.  Good hemostasis was achieved throughout.  Fibrillar was placed throughout the operative field.  Strap muscles were reapproximated in the midline with interrupted 3-0 Vicryl sutures. Platysma was closed with interrupted 3-0 Vicryl sutures.  Skin was closed with a running 4-0 Monocryl subcuticular suture.  Wound was washed and dried and benzoin and Steri-Strips were applied.  Sterile dressings were applied.  The patient was awakened from anesthesia and brought to the recovery room.  The patient tolerated the procedure well.   [name], MD, [facility] Office: [phone]    TMG/MEDQ  D:  06/16/2012  T:  06/17/2012  Job:  [id number]  cc:   Gretta Arab. Valentina Lucks, M.D. Fax: [phone]

## 2012-06-17 NOTE — Discharge Summary (Signed)
Physician Discharge Summary [facility]  Patient ID: Susan [name] MRN: [id number] DOB/AGE: August 21, 1940 72 y.o.  Admit date: 06/16/2012 Discharge date: 06/17/2012  Admission Diagnoses:  Primary hyperparathyroidism  Discharge Diagnoses:  Principal Problem:   Hyperparathyroidism, primary   Discharged Condition: good  Hospital Course: patient admitted after neck exploration and parathyroidectomy for primary hyperparathyroidism.  Post op course uncomplicated.  Tolerating diet.  Calcium level down to 10.0 on morning following surgery.  Plan discharge to home today.  Consults: None  Significant Diagnostic Studies: labs: calcium  Treatments: surgery: parathyroidectomy  Discharge Exam: Blood pressure 154/75, pulse 64, temperature 98.2 F (36.8 C), temperature source Oral, resp. rate 18, height 5\' 5"  (1.651 m), weight 198 lb 9.6 oz (90.084 kg), SpO2 98.00%. HEENT - clear Neck - wound clear and dry, minimal STS, voice normal Chest - clear  Disposition: Home with family  Discharge Orders   Future Appointments Provider Department Dept Phone   06/30/2012 2:00 PM [name], MD [facility], [place] [phone]   Future Orders Complete By Expires     Apply dressing  As directed     Scheduling Instructions:      Apply light gauze dressing to neck before discharge home today.    Diet - low sodium heart healthy  As directed     Discharge instructions  As directed     Comments:      THYROID & PARATHYROID SURGERY - POST OP INSTRUCTIONS  Always review your discharge instruction sheet from the facility where your surgery was performed.  A prescription for pain medication may be given to you upon discharge.  Take your pain medication as prescribed.  If narcotic pain medicine is not needed, then you may take acetaminophen (Tylenol) or ibuprofen (Advil) as needed.  Take your usually prescribed medications unless otherwise directed.  If you need a refill on your  pain medication, please contact your pharmacy. They will contact our office to request authorization.  Prescriptions will not be processed after 5 pm or on weekends.  Start with a light diet upon arrival home, such as soup and crackers or toast.  Be sure to drink plenty of fluids daily.  Resume your normal diet the day after surgery.  Most patients will experience some swelling and bruising on the chest and neck area.  Ice packs will help.  Swelling and bruising can take several days to resolve.   It is common to experience some constipation if taking pain medication after surgery.  Increasing fluid intake and taking a stool softener will usually help or prevent this problem.  A mild laxative (Milk of Magnesia or Miralax) should be taken according to package directions if there are no bowel movements after 48 hours.  You may remove your bandages 24-48 hours after surgery, and you may shower at that time.  You have steri-strips (small skin tapes) in place directly over the incision.  These strips should be left on the skin for 7-10 days and then removed.  You may resume regular (light) daily activities beginning the next day-such as daily self-care, walking, climbing stairs-gradually increasing activities as tolerated.  You may have sexual intercourse when it is comfortable.  Refrain from any heavy lifting or straining until approved by your doctor.  You may drive when you no longer are taking prescription pain medication, you can comfortably wear a seatbelt, and you can safely maneuver your car and apply brakes.  You should see your doctor in the office for  a follow-up appointment approximately two to three weeks after your surgery.  Make sure that you call for this appointment within a day or two after you arrive home to insure a convenient appointment time.  WHEN TO CALL YOUR DOCTOR: -- Fever greater than 101.5 -- Inability to urinate -- Nausea and/or vomiting - persistent -- Extreme swelling or  bruising -- Continued bleeding from incision -- Increased pain, redness, or drainage from the incision -- Difficulty swallowing or breathing -- Muscle cramping or spasms -- Numbness or tingling in hands or around lips  The clinic staff is available to answer your questions during regular business hours.  Please don't hesitate to call and ask to speak to one of the nurses if you have concerns.  [name], MD, FACS General & Endocrine Surgery [facility] Office: [phone]    Increase activity slowly  As directed     Remove dressing in 24 hours  As directed         Medication List    TAKE these medications       ALPRAZolam 0.25 MG tablet  Commonly known as:  XANAX  Take 0.25 mg by mouth at bedtime as needed for sleep.     buPROPion 100 MG tablet  Commonly known as:  WELLBUTRIN  Take 100 mg by mouth 2 (two) times daily.     calcium-vitamin D 500-200 MG-UNIT per tablet  Commonly known as:  OSCAL WITH D  Take 1 tablet by mouth daily.     HYDROcodone-acetaminophen 5-325 MG per tablet  Commonly known as:  NORCO/VICODIN  Take 1-2 tablets by mouth every 4 (four) hours as needed for pain.     ibuprofen 200 MG tablet  Commonly known as:  ADVIL,MOTRIN  Take 400 mg by mouth every 6 (six) hours as needed for pain.     multivitamin with minerals Tabs  Take 1 tablet by mouth daily.     OVER THE COUNTER MEDICATION  Eye vitamin 3 tabs q day     sertraline 100 MG tablet  Commonly known as:  ZOLOFT  Take 100 mg by mouth every morning.     valsartan-hydrochlorothiazide 160-12.5 MG per tablet  Commonly known as:  DIOVAN-HCT  Take 1 tablet by mouth every morning.     vitamin B-12 100 MCG tablet  Commonly known as:  CYANOCOBALAMIN  Take 100 mcg by mouth daily.     Vitamin D (Ergocalciferol) 50000 UNITS Caps  Commonly known as:  DRISDOL  Take 50,000 Units by mouth every 7 (seven) days.         [name], MD, [facility] Office: [phone]   Signed: [name] 06/17/2012, 8:02 AM

## 2012-06-25 ENCOUNTER — Telehealth (INDEPENDENT_AMBULATORY_CARE_PROVIDER_SITE_OTHER): Payer: Self-pay

## 2012-06-25 ENCOUNTER — Other Ambulatory Visit (INDEPENDENT_AMBULATORY_CARE_PROVIDER_SITE_OTHER): Payer: Self-pay

## 2012-06-25 NOTE — Telephone Encounter (Signed)
Pt called and reminded to get labs on 6-23 and keep ov 6-24.

## 2012-06-29 ENCOUNTER — Other Ambulatory Visit (INDEPENDENT_AMBULATORY_CARE_PROVIDER_SITE_OTHER): Payer: Self-pay | Admitting: Surgery

## 2012-06-30 ENCOUNTER — Encounter (INDEPENDENT_AMBULATORY_CARE_PROVIDER_SITE_OTHER): Payer: Self-pay | Admitting: Surgery

## 2012-06-30 ENCOUNTER — Ambulatory Visit (INDEPENDENT_AMBULATORY_CARE_PROVIDER_SITE_OTHER): Payer: BC Managed Care – HMO | Admitting: Surgery

## 2012-06-30 VITALS — BP 156/80 | HR 84 | Temp 98.7°F | Resp 16 | Ht 62.5 in | Wt 198.6 lb

## 2012-06-30 DIAGNOSIS — E210 Primary hyperparathyroidism: Secondary | ICD-10-CM

## 2012-06-30 NOTE — Progress Notes (Signed)
General Surgery [facility]  Visit Diagnoses: 1. Hyperparathyroidism, primary     HISTORY: Patient returns for postoperative visit having undergone neck exploration and left superior parathyroidectomy. Pathology shows a 500 mg parathyroid gland measuring 1.3 cm in greatest dimension. It is hypercellular. Followup calcium levels have fallen from a preoperative level of 11.0 to a postoperative level of 10.0 and 10.2.  EXAM: Surgical wound is healing nicely. Mild soft tissue swelling. No sign of infection. Voice quality is normal.  IMPRESSION: Status post neck exploration and parathyroidectomy for primary hyperparathyroidism  PLAN: The patient and I discussed her laboratory results as well as reviewed her pathology report. I would like to repeat her calcium level and her intact PTH level in 6 weeks. I will see her back after those studies are performed. At this point she continues to take vitamin D supplements. I've asked her to begin applying topical creams to her incision.  Patient will return in 6 weeks.  [name], MD, FACS General & Endocrine Surgery [facility]

## 2012-06-30 NOTE — Patient Instructions (Signed)
  COCOA BUTTER & VITAMIN E CREAM  (Palmer's or other brand)  Apply cocoa butter/vitamin E cream to your incision 2 - 3 times daily.  Massage cream into incision for one minute with each application.  Use sunscreen (50 SPF or higher) for first 6 months after surgery if area is exposed to sun.  You may substitute Mederma or other scar reducing creams as desired.   

## 2012-06-30 NOTE — Addendum Note (Signed)
Addended by: Joanette Gula on: 06/30/2012 02:40 PM   Modules accepted: Orders

## 2012-07-17 ENCOUNTER — Encounter (HOSPITAL_COMMUNITY): Payer: Self-pay | Admitting: Pharmacy Technician

## 2012-07-21 ENCOUNTER — Other Ambulatory Visit: Payer: Self-pay | Admitting: Orthopedic Surgery

## 2012-07-22 ENCOUNTER — Other Ambulatory Visit (HOSPITAL_COMMUNITY): Payer: Medicare Other

## 2012-07-22 ENCOUNTER — Encounter (HOSPITAL_COMMUNITY): Payer: Self-pay | Admitting: Orthopedic Surgery

## 2012-07-22 DIAGNOSIS — G4733 Obstructive sleep apnea (adult) (pediatric): Secondary | ICD-10-CM | POA: Diagnosis present

## 2012-07-22 DIAGNOSIS — M1711 Unilateral primary osteoarthritis, right knee: Secondary | ICD-10-CM | POA: Diagnosis present

## 2012-07-22 NOTE — Progress Notes (Addendum)
Anesthesia chart review: Patient is a 72 year old female scheduled for right TKA on 07/29/2012 by Dr. Eulah Pont. Her PAT visit is on 07/23/12.  History includes obesity, hypertension, hyperlipidemia, primary hyperparathyroidism status post left superior parathyroidectomy 06/16/12, depression, osteoarthritis, spinal stenosis, varicose veins, former smoker, cholecystectomy, OSA.  PCP is Dr. Maurice Small who feels there is no medical contraindication to surgery. She recommends that patient receive "anesthesia and PCA precautions and should be followed by the medical hospitalist team."  Her note also indicates that Dr. Gerrit Friends has cleared patient for other surgery following her left superior parathyroidectomy. 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  I think her EKG appears stable since 04/24/11.   CXR on 06/09/12 showed no acute cardiopulmonary disease.  Preoperative labs pending her PAT visit on 07/23/12.  Velna Ochs [facility]/Anesthesiology Phone [phone] 07/22/2012 2:46 PM  Addendum: 07/24/12 12:10 PM Preoperative labs noted.  Ca 10.0.

## 2012-07-23 ENCOUNTER — Encounter (HOSPITAL_COMMUNITY): Payer: Self-pay

## 2012-07-23 ENCOUNTER — Encounter (HOSPITAL_COMMUNITY)
Admission: RE | Admit: 2012-07-23 | Discharge: 2012-07-23 | Disposition: A | Discharge status: Home / Self Care | Payer: Medicare Other | Source: Ambulatory Visit | Attending: Orthopedic Surgery | Admitting: Orthopedic Surgery

## 2012-07-23 DIAGNOSIS — Z01818 Encounter for other preprocedural examination: Secondary | ICD-10-CM | POA: Insufficient documentation

## 2012-07-23 DIAGNOSIS — Z01812 Encounter for preprocedural laboratory examination: Secondary | ICD-10-CM | POA: Insufficient documentation

## 2012-07-23 HISTORY — DX: Gastro-esophageal reflux disease without esophagitis: K21.9

## 2012-07-23 HISTORY — DX: Unspecified urinary incontinence: R32

## 2012-07-23 LAB — CBC
HCT: 34.2 % — ABNORMAL LOW (ref 36.0–46.0)
Hemoglobin: 12 g/dL (ref 12.0–15.0)
MCH: 31.5 pg (ref 26.0–34.0)
MCHC: 35.1 g/dL (ref 30.0–36.0)
MCV: 89.8 fL (ref 78.0–100.0)
Platelets: 220 10*3/uL (ref 150–400)
RBC: 3.81 MIL/uL — ABNORMAL LOW (ref 3.87–5.11)
RDW: 13.8 % (ref 11.5–15.5)
WBC: 4.4 10*3/uL (ref 4.0–10.5)

## 2012-07-23 LAB — COMPREHENSIVE METABOLIC PANEL
ALT: 17 U/L (ref 0–35)
AST: 22 U/L (ref 0–37)
Albumin: 3.9 g/dL (ref 3.5–5.2)
Alkaline Phosphatase: 85 U/L (ref 39–117)
BUN: 17 mg/dL (ref 6–23)
CO2: 25 mEq/L (ref 19–32)
Calcium: 10 mg/dL (ref 8.4–10.5)
Chloride: 106 mEq/L (ref 96–112)
Creatinine, Ser: 0.97 mg/dL (ref 0.50–1.10)
GFR calc Af Amer: 65 mL/min — ABNORMAL LOW (ref >90)
GFR calc non Af Amer: 56 mL/min — ABNORMAL LOW (ref >90)
Glucose, Bld: 100 mg/dL — ABNORMAL HIGH (ref 70–99)
Potassium: 3.5 mEq/L (ref 3.5–5.1)
Sodium: 141 mEq/L (ref 135–145)
Total Bilirubin: 0.3 mg/dL (ref 0.3–1.2)
Total Protein: 7.1 g/dL (ref 6.0–8.3)

## 2012-07-23 LAB — URINALYSIS, ROUTINE W REFLEX MICROSCOPIC
Leukocytes, UA: NEGATIVE
Nitrite: NEGATIVE
Specific Gravity, Urine: 1.026 (ref 1.005–1.030)
Urobilinogen, UA: 0.2 mg/dL (ref 0.0–1.0)

## 2012-07-23 LAB — APTT: aPTT: 26 seconds (ref 24–37)

## 2012-07-23 LAB — TYPE AND SCREEN: Antibody Screen: NEGATIVE

## 2012-07-23 LAB — SURGICAL PCR SCREEN
MRSA, PCR: NEGATIVE
Staphylococcus aureus: NEGATIVE

## 2012-07-23 LAB — ABO/RH: ABO/RH(D): B POS

## 2012-07-23 NOTE — Pre-Procedure Instructions (Signed)
JONIYAH MALLINGER  07/23/2012   Your procedure is scheduled on:  Wednesday July 29, 2012.  Report to [facility] at 8:30 AM.  Call this number if you have problems the morning of surgery: [phone]   Remember:   Do not eat food or drink liquids after midnight.   Take these medicines the morning of surgery with A SIP OF WATER: Bupropion (Wellbutrin), Hydrocodone if needed for pain, Omeprazole (Prilosec), Sertraline (Zoloft)   Do not wear jewelry, make-up or nail polish.  Do not wear lotions, powders, or perfumes.  Do not shave 48 hours prior to surgery.   Do not bring valuables to the hospital.  [facility] is not responsible for any belongings or valuables.  Contacts, dentures or bridgework may not be worn into surgery.  Leave suitcase in the car. After surgery it may be brought to your room.  For patients admitted to the hospital, checkout time is 11:00 AM the day of discharge.   Patients discharged the day of surgery will not be allowed to drive home.  Name and phone number of your driver: Family/Friend  Special Instructions: Shower using CHG 2 nights before surgery and the night before surgery.  If you shower the day of surgery use CHG.  Use special wash - you have one bottle of CHG for all showers.  You should use approximately 1/3 of the bottle for each shower.   Please read over the following fact sheets that you were given: Pain Booklet, Coughing and Deep Breathing, Blood Transfusion Information, Total Joint Packet, MRSA Information and Surgical Site Infection Prevention

## 2012-07-23 NOTE — Progress Notes (Signed)
Patient informed Nurse that she had a stress test approximately 25 years ago, and denied having a cardiac cath. Sleep study results on chart.

## 2012-07-23 NOTE — Progress Notes (Signed)
Sleep study results requested from [facility] per [place], [place]. Office [phone] fax [phone].

## 2012-07-28 MED ORDER — CEFAZOLIN SODIUM-DEXTROSE 2-3 GM-% IV SOLR
2.0000 g | INTRAVENOUS | Status: AC
  Administered 2012-07-29: 2 g via INTRAVENOUS
  Filled 2012-07-28: qty 50

## 2012-07-28 NOTE — H&P (Signed)
[facility] [address]   [address] [place], [place] [address] [phone] A Division of [facility]  Loreta Ave, M.D.   Robert A. Thurston Hole, M.D.   Burnell Blanks, M.D.   Eulas Post, M.D.   Lunette Stands, M.D Buford Dresser, M.D.  Charlsie Quest, M.D.   Estell Harpin, M.D.   Melina Fiddler, M.D. Genene Churn. Barry Dienes, PA-C            Kirstin A. Shepperson, PA-C Josh [place], PA-C [place], [place]   RE: Susan [name], Susan [name]                                [id number]      DOB: 05-15-1940 PROGRESS NOTE: 07-14-12 Chief complaint: Right knee end stage osteoarthritis.  Patient has had symptoms of arthritis of the right knee for many years now.  It is now end stage bone on bone in the lateral compartment with valgus thrust and increasing instability.  She is experiencing both rest pain and night pain with marked functional impact.  She has failed conservative treatment, including Cortisone injection, anti-inflammatories, pain medication, physical therapy and change in activity.  All of these unfortunately have failed and she wishes to discuss having a right total knee replacement, as was previously outlined in her last office visit.  Her primary care physician is Dr. Maurice Small and her general surgeon is Dr. Gerrit Friends.   Current medications: Xanax, Wellbutrin, Os-Cal, Ibuprofen (which she will stop taking one week before her surgery), multivitamins, Prilosec, eye vitamins, Zoloft, Vitamin B12, Vicodin and Valsartan Hydrochlorothiazide. Allergies: Statins with a reaction that causes her joints to hurt, as well as ACE inhibitors (reaction uncharted).   Past medical history: Significant for high blood pressure, depression, gastric ulcer disease and sleep apnea. Review of systems: She wears glasses and has been told she has cataracts.  She does note easy bruising and chronic ankle swelling in her left ankle.  She denies any fevers,  chills or recent illness.   Past surgical history: Significant for parathyroidectomy in 2014.  No adverse reaction to any anesthesia to her recollection. Family history: Positive for hypertension, cancer and diabetes. Social history: Does not smoke, having stopped in 1990.  Does not use alcohol.  The patient is widowed, retired and lives with her family in a single story residence.  EXAMINATION: Height: 5?3.  Weight: 200 pounds.  BMI: 35.4.  Pulse: 79.  Blood pressure: 146/79.  Temperature: 98.2.  Head is normocephalic, a traumatic.  EOMI.  Her gait is notably altered by her arthritis.  Lungs are clear to auscultation.  Heart: Regular in rate and rhythm.  Abdomen: Soft and non-tender.  Examination of her right knee shows range of motion today from 0-95 degrees with obvious valgus deformity of some 5-10 degrees.  Positive crepitus.  1-1+ effusion.  Stable ligamentously.  Calf non-tender.  Neurovascularly intact.  Examination of her left foot does show some swelling around the foot and ankle.  States this is a chronic pain, otherwise she reports no numbness, weakness or other changes in her lower extremities.       X-RAYS: Plain radiographs with markers show end stage DJD of the lateral compartment, bone on bone.    IMPRESSION: Right knee degenerative joint disease with chronic pain that has failed conservative treatment.  Continued  [facility] [address]   [address]  [place] [address] [phone] A Division of [facility]  Loreta Ave, M.D.   Robert A. Thurston Hole, M.D.   Burnell Blanks, M.D.   Eulas Post, M.D.   Lunette Stands, M.D Buford Dresser, M.D.  Charlsie Quest, M.D.   Estell Harpin, M.D.   Melina Fiddler, M.D. Genene Churn. Barry Dienes, PA-C            Kirstin A. Shepperson, PA-C Josh Bradford, PA-C [place], [place]   RE: Katherin, Ramey                                [phone]      DOB:  [birth year] PROGRESS NOTE: 07-14-12 PLAN: The patient wishes to proceed with a total knee arthroplasty of the right knee as scheduled.  Risks and benefits were discussed today at length.  Possible complications were discussed in detail.  Patient voices understanding of these.  All questions were answered to her satisfaction.  She will return to see Dr. Eulah Pont postoperatively.    Loreta Ave, M.D.   Electronically verified by Loreta Ave, M.D. DFM(BR):jjh D 07-17-12 T 07-20-12

## 2012-07-29 ENCOUNTER — Encounter (HOSPITAL_COMMUNITY): Payer: Self-pay | Admitting: *Deleted

## 2012-07-29 ENCOUNTER — Inpatient Hospital Stay (HOSPITAL_COMMUNITY)
Admission: RE | Admit: 2012-07-29 | Discharge: 2012-07-31 | DRG: 470 | Disposition: A | Discharge status: Skilled Nursing Facility | Payer: Medicare Other | Source: Ambulatory Visit | Attending: Orthopedic Surgery | Admitting: Orthopedic Surgery

## 2012-07-29 ENCOUNTER — Inpatient Hospital Stay (HOSPITAL_COMMUNITY): Payer: Medicare Other

## 2012-07-29 ENCOUNTER — Inpatient Hospital Stay (HOSPITAL_COMMUNITY): Payer: Medicare Other | Admitting: Anesthesiology

## 2012-07-29 ENCOUNTER — Encounter (HOSPITAL_COMMUNITY): Payer: Self-pay | Admitting: Vascular Surgery

## 2012-07-29 ENCOUNTER — Encounter (HOSPITAL_COMMUNITY)
Admission: RE | Disposition: A | Discharge status: Skilled Nursing Facility | Payer: Self-pay | Source: Ambulatory Visit | Attending: Orthopedic Surgery

## 2012-07-29 DIAGNOSIS — Z833 Family history of diabetes mellitus: Secondary | ICD-10-CM

## 2012-07-29 DIAGNOSIS — Z79899 Other long term (current) drug therapy: Secondary | ICD-10-CM

## 2012-07-29 DIAGNOSIS — M545 Low back pain, unspecified: Secondary | ICD-10-CM | POA: Diagnosis present

## 2012-07-29 DIAGNOSIS — R05 Cough: Secondary | ICD-10-CM | POA: Diagnosis present

## 2012-07-29 DIAGNOSIS — Z8249 Family history of ischemic heart disease and other diseases of the circulatory system: Secondary | ICD-10-CM

## 2012-07-29 DIAGNOSIS — R053 Chronic cough: Secondary | ICD-10-CM | POA: Diagnosis present

## 2012-07-29 DIAGNOSIS — M1711 Unilateral primary osteoarthritis, right knee: Secondary | ICD-10-CM | POA: Diagnosis present

## 2012-07-29 DIAGNOSIS — G8929 Other chronic pain: Secondary | ICD-10-CM | POA: Diagnosis present

## 2012-07-29 DIAGNOSIS — R059 Cough, unspecified: Secondary | ICD-10-CM | POA: Diagnosis present

## 2012-07-29 DIAGNOSIS — E210 Primary hyperparathyroidism: Secondary | ICD-10-CM | POA: Diagnosis present

## 2012-07-29 DIAGNOSIS — F3289 Other specified depressive episodes: Secondary | ICD-10-CM | POA: Diagnosis present

## 2012-07-29 DIAGNOSIS — F329 Major depressive disorder, single episode, unspecified: Secondary | ICD-10-CM | POA: Diagnosis present

## 2012-07-29 DIAGNOSIS — Z7901 Long term (current) use of anticoagulants: Secondary | ICD-10-CM

## 2012-07-29 DIAGNOSIS — M1710 Unilateral primary osteoarthritis, unspecified knee: Principal | ICD-10-CM | POA: Diagnosis present

## 2012-07-29 DIAGNOSIS — K219 Gastro-esophageal reflux disease without esophagitis: Secondary | ICD-10-CM | POA: Diagnosis present

## 2012-07-29 DIAGNOSIS — E785 Hyperlipidemia, unspecified: Secondary | ICD-10-CM | POA: Diagnosis present

## 2012-07-29 DIAGNOSIS — I10 Essential (primary) hypertension: Secondary | ICD-10-CM | POA: Diagnosis present

## 2012-07-29 DIAGNOSIS — F411 Generalized anxiety disorder: Secondary | ICD-10-CM | POA: Diagnosis present

## 2012-07-29 DIAGNOSIS — G4733 Obstructive sleep apnea (adult) (pediatric): Secondary | ICD-10-CM | POA: Diagnosis present

## 2012-07-29 HISTORY — DX: Low back pain, unspecified: M54.50

## 2012-07-29 HISTORY — DX: Obstructive sleep apnea (adult) (pediatric): Z99.89

## 2012-07-29 HISTORY — DX: Other chronic pain: G89.29

## 2012-07-29 HISTORY — DX: Umbilical hernia without obstruction or gangrene: K42.9

## 2012-07-29 HISTORY — DX: Adverse effect of unspecified anesthetic, initial encounter: T41.45XA

## 2012-07-29 HISTORY — DX: Unilateral primary osteoarthritis, right knee: M17.11

## 2012-07-29 HISTORY — DX: Anxiety disorder, unspecified: F41.9

## 2012-07-29 HISTORY — DX: Other complications of anesthesia, initial encounter: T88.59XA

## 2012-07-29 HISTORY — DX: Family history of other specified conditions: Z84.89

## 2012-07-29 HISTORY — DX: Obstructive sleep apnea (adult) (pediatric): G47.33

## 2012-07-29 HISTORY — DX: Personal history of other diseases of the digestive system: Z87.19

## 2012-07-29 HISTORY — DX: Low back pain: M54.5

## 2012-07-29 HISTORY — PX: TOTAL KNEE ARTHROPLASTY: SHX125

## 2012-07-29 LAB — CREATININE, SERUM
Creatinine, Ser: 0.83 mg/dL (ref 0.50–1.10)
GFR calc Af Amer: 79 mL/min — ABNORMAL LOW (ref >90)

## 2012-07-29 LAB — CBC
Platelets: 197 10*3/uL (ref 150–400)
RBC: 3.77 MIL/uL — ABNORMAL LOW (ref 3.87–5.11)
RDW: 13.8 % (ref 11.5–15.5)
WBC: 12 10*3/uL — ABNORMAL HIGH (ref 4.0–10.5)

## 2012-07-29 SURGERY — ARTHROPLASTY, KNEE, TOTAL
Anesthesia: General | Site: Knee | Laterality: Right | Wound class: Clean

## 2012-07-29 MED ORDER — BISACODYL 5 MG PO TBEC: 5.0000 mg | DELAYED_RELEASE_TABLET | Freq: Every day | ORAL | Status: DC | PRN

## 2012-07-29 MED ORDER — OXYCODONE HCL 5 MG PO TABS
5.0000 mg | ORAL_TABLET | ORAL | Status: DC | PRN
Start: 2012-07-29 — End: 2012-07-31
  Administered 2012-07-29 – 2012-07-30 (×2): 10 mg via ORAL
  Filled 2012-07-29: qty 2

## 2012-07-29 MED ORDER — METOCLOPRAMIDE HCL 5 MG/ML IJ SOLN: 5.0000 mg | Freq: Three times a day (TID) | INTRAMUSCULAR | Status: DC | PRN

## 2012-07-29 MED ORDER — MIDAZOLAM HCL 2 MG/2ML IJ SOLN: 1.0000 mg | INTRAMUSCULAR | Status: DC | PRN

## 2012-07-29 MED ORDER — BUPIVACAINE HCL (PF) 0.25 % IJ SOLN
INTRAMUSCULAR | Status: DC | PRN
  Administered 2012-07-29: 10 mL

## 2012-07-29 MED ORDER — ALUM & MAG HYDROXIDE-SIMETH 200-200-20 MG/5ML PO SUSP: 30.0000 mL | ORAL | Status: DC | PRN

## 2012-07-29 MED ORDER — POTASSIUM CHLORIDE IN NACL 20-0.9 MEQ/L-% IV SOLN
INTRAVENOUS | Status: DC
  Administered 2012-07-29: 22:00:00 via INTRAVENOUS
  Filled 2012-07-29 (×7): qty 1000

## 2012-07-29 MED ORDER — BUPIVACAINE HCL (PF) 0.25 % IJ SOLN
INTRAMUSCULAR | Status: AC
  Filled 2012-07-29: qty 30

## 2012-07-29 MED ORDER — FENTANYL CITRATE 0.05 MG/ML IJ SOLN: 50.0000 ug | INTRAMUSCULAR | Status: DC | PRN

## 2012-07-29 MED ORDER — ONDANSETRON HCL 4 MG/2ML IJ SOLN: 4.0000 mg | Freq: Four times a day (QID) | INTRAMUSCULAR | Status: DC | PRN

## 2012-07-29 MED ORDER — BUPIVACAINE LIPOSOME 1.3 % IJ SUSP
INTRAMUSCULAR | Status: DC | PRN
  Administered 2012-07-29: 20 mL

## 2012-07-29 MED ORDER — BUPROPION HCL 100 MG PO TABS
100.0000 mg | ORAL_TABLET | Freq: Two times a day (BID) | ORAL | Status: DC
  Administered 2012-07-29 – 2012-07-31 (×4): 100 mg via ORAL
  Filled 2012-07-29 (×6): qty 1

## 2012-07-29 MED ORDER — SERTRALINE HCL 100 MG PO TABS
100.0000 mg | ORAL_TABLET | Freq: Every morning | ORAL | Status: DC
  Administered 2012-07-30 – 2012-07-31 (×2): 100 mg via ORAL
  Filled 2012-07-29 (×2): qty 1

## 2012-07-29 MED ORDER — DOCUSATE SODIUM 100 MG PO CAPS
100.0000 mg | ORAL_CAPSULE | Freq: Two times a day (BID) | ORAL | Status: DC
  Administered 2012-07-29 – 2012-07-31 (×4): 100 mg via ORAL
  Filled 2012-07-29 (×4): qty 1

## 2012-07-29 MED ORDER — WARFARIN VIDEO
Freq: Once | Status: AC
  Administered 2012-07-30: 10:00:00

## 2012-07-29 MED ORDER — PANTOPRAZOLE SODIUM 40 MG PO TBEC
80.0000 mg | DELAYED_RELEASE_TABLET | Freq: Every day | ORAL | Status: DC
  Administered 2012-07-30 – 2012-07-31 (×2): 80 mg via ORAL
  Filled 2012-07-29: qty 2
  Filled 2012-07-29: qty 1

## 2012-07-29 MED ORDER — ONDANSETRON HCL 4 MG PO TABS: 4.0000 mg | ORAL_TABLET | Freq: Four times a day (QID) | ORAL | Status: DC | PRN

## 2012-07-29 MED ORDER — DEXAMETHASONE 6 MG PO TABS
10.0000 mg | ORAL_TABLET | Freq: Three times a day (TID) | ORAL | Status: AC
  Administered 2012-07-29 – 2012-07-30 (×3): 10 mg via ORAL
  Filled 2012-07-29 (×4): qty 1

## 2012-07-29 MED ORDER — DEXAMETHASONE SODIUM PHOSPHATE 10 MG/ML IJ SOLN
10.0000 mg | Freq: Three times a day (TID) | INTRAMUSCULAR | Status: AC
  Filled 2012-07-29 (×3): qty 1

## 2012-07-29 MED ORDER — HYDROMORPHONE HCL PF 1 MG/ML IJ SOLN
0.5000 mg | INTRAMUSCULAR | Status: DC | PRN
  Administered 2012-07-29 – 2012-07-30 (×9): 0.5 mg via INTRAVENOUS
  Filled 2012-07-29 (×9): qty 1

## 2012-07-29 MED ORDER — DIPHENHYDRAMINE HCL 12.5 MG/5ML PO ELIX: 12.5000 mg | ORAL_SOLUTION | ORAL | Status: DC | PRN

## 2012-07-29 MED ORDER — LACTATED RINGERS IV SOLN
INTRAVENOUS | Status: DC | PRN
  Administered 2012-07-29 (×2): via INTRAVENOUS

## 2012-07-29 MED ORDER — PROPOFOL 10 MG/ML IV BOLUS
INTRAVENOUS | Status: DC | PRN
  Administered 2012-07-29: 50 mg via INTRAVENOUS
  Administered 2012-07-29: 180 mg via INTRAVENOUS

## 2012-07-29 MED ORDER — COUMADIN BOOK
Freq: Once | Status: DC
  Filled 2012-07-29 (×2): qty 1

## 2012-07-29 MED ORDER — HYDROMORPHONE HCL PF 1 MG/ML IJ SOLN
0.2500 mg | INTRAMUSCULAR | Status: DC | PRN
  Administered 2012-07-29 (×2): 0.5 mg via INTRAVENOUS

## 2012-07-29 MED ORDER — LIDOCAINE HCL (CARDIAC) 20 MG/ML IV SOLN
INTRAVENOUS | Status: DC | PRN
  Administered 2012-07-29: 90 mg via INTRAVENOUS

## 2012-07-29 MED ORDER — MIDAZOLAM HCL 2 MG/2ML IJ SOLN
INTRAMUSCULAR | Status: AC
  Administered 2012-07-29: 1 mg via INTRAVENOUS
  Filled 2012-07-29: qty 2

## 2012-07-29 MED ORDER — ARTIFICIAL TEARS OP OINT
TOPICAL_OINTMENT | OPHTHALMIC | Status: DC | PRN
  Administered 2012-07-29: 1 via OPHTHALMIC

## 2012-07-29 MED ORDER — IRBESARTAN 150 MG PO TABS
150.0000 mg | ORAL_TABLET | Freq: Every day | ORAL | Status: DC
  Administered 2012-07-29 – 2012-07-31 (×3): 150 mg via ORAL
  Filled 2012-07-29 (×3): qty 1

## 2012-07-29 MED ORDER — SODIUM CHLORIDE 0.9 % IR SOLN
Status: DC | PRN
  Administered 2012-07-29: 1000 mL
  Administered 2012-07-29: 3000 mL

## 2012-07-29 MED ORDER — ONDANSETRON HCL 4 MG/2ML IJ SOLN
INTRAMUSCULAR | Status: DC | PRN
  Administered 2012-07-29: 4 mg via INTRAVENOUS

## 2012-07-29 MED ORDER — FENTANYL CITRATE 0.05 MG/ML IJ SOLN
INTRAMUSCULAR | Status: AC
  Administered 2012-07-29: 50 ug via INTRAVENOUS
  Filled 2012-07-29: qty 2

## 2012-07-29 MED ORDER — WARFARIN SODIUM 5 MG PO TABS
5.0000 mg | ORAL_TABLET | Freq: Once | ORAL | Status: AC
  Administered 2012-07-29: 5 mg via ORAL
  Filled 2012-07-29: qty 1

## 2012-07-29 MED ORDER — WARFARIN - PHARMACIST DOSING INPATIENT
Freq: Every day | Status: DC
  Administered 2012-07-30: 18:00:00

## 2012-07-29 MED ORDER — BUPIVACAINE LIPOSOME 1.3 % IJ SUSP
20.0000 mL | INTRAMUSCULAR | Status: DC
  Filled 2012-07-29: qty 20

## 2012-07-29 MED ORDER — ACETAMINOPHEN 325 MG PO TABS: 650.0000 mg | ORAL_TABLET | Freq: Four times a day (QID) | ORAL | Status: DC | PRN

## 2012-07-29 MED ORDER — LACTATED RINGERS IV SOLN
INTRAVENOUS | Status: DC
  Administered 2012-07-29: 10:00:00 via INTRAVENOUS

## 2012-07-29 MED ORDER — PHENOL 1.4 % MT LIQD: 1.0000 | OROMUCOSAL | Status: DC | PRN

## 2012-07-29 MED ORDER — VALSARTAN-HYDROCHLOROTHIAZIDE 160-12.5 MG PO TABS: 1.0000 | ORAL_TABLET | Freq: Every morning | ORAL | Status: DC

## 2012-07-29 MED ORDER — MENTHOL 3 MG MT LOZG: 1.0000 | LOZENGE | OROMUCOSAL | Status: DC | PRN

## 2012-07-29 MED ORDER — OXYCODONE-ACETAMINOPHEN 5-325 MG PO TABS
ORAL_TABLET | ORAL | Status: AC
  Filled 2012-07-29: qty 2

## 2012-07-29 MED ORDER — CHLORHEXIDINE GLUCONATE 4 % EX LIQD: 60.0000 mL | Freq: Once | CUTANEOUS | Status: DC

## 2012-07-29 MED ORDER — ALPRAZOLAM 0.25 MG PO TABS: 0.2500 mg | ORAL_TABLET | Freq: Every evening | ORAL | Status: DC | PRN

## 2012-07-29 MED ORDER — METOCLOPRAMIDE HCL 10 MG PO TABS: 5.0000 mg | ORAL_TABLET | Freq: Three times a day (TID) | ORAL | Status: DC | PRN

## 2012-07-29 MED ORDER — ACETAMINOPHEN 650 MG RE SUPP: 650.0000 mg | Freq: Four times a day (QID) | RECTAL | Status: DC | PRN

## 2012-07-29 MED ORDER — DEXTROSE 5 % IV SOLN
INTRAVENOUS | Status: DC | PRN
  Administered 2012-07-29: 12:00:00 via INTRAVENOUS

## 2012-07-29 MED ORDER — OXYCODONE HCL 5 MG PO TABS
ORAL_TABLET | ORAL | Status: AC
  Filled 2012-07-29: qty 2

## 2012-07-29 MED ORDER — HYDROMORPHONE HCL PF 1 MG/ML IJ SOLN
INTRAMUSCULAR | Status: AC
  Filled 2012-07-29: qty 1

## 2012-07-29 MED ORDER — OXYCODONE-ACETAMINOPHEN 5-325 MG PO TABS
1.0000 | ORAL_TABLET | ORAL | Status: DC | PRN
  Administered 2012-07-29 – 2012-07-31 (×6): 2 via ORAL
  Filled 2012-07-29 (×5): qty 2

## 2012-07-29 MED ORDER — HYDROCHLOROTHIAZIDE 12.5 MG PO CAPS
12.5000 mg | ORAL_CAPSULE | Freq: Every day | ORAL | Status: DC
  Administered 2012-07-29 – 2012-07-31 (×3): 12.5 mg via ORAL
  Filled 2012-07-29 (×3): qty 1

## 2012-07-29 MED ORDER — ENOXAPARIN SODIUM 30 MG/0.3ML ~~LOC~~ SOLN
30.0000 mg | Freq: Two times a day (BID) | SUBCUTANEOUS | Status: DC
  Administered 2012-07-30 – 2012-07-31 (×3): 30 mg via SUBCUTANEOUS
  Filled 2012-07-29 (×5): qty 0.3

## 2012-07-29 MED ORDER — FENTANYL CITRATE 0.05 MG/ML IJ SOLN
INTRAMUSCULAR | Status: DC | PRN
  Administered 2012-07-29 (×5): 50 ug via INTRAVENOUS

## 2012-07-29 MED ORDER — ONDANSETRON HCL 4 MG/2ML IJ SOLN: 4.0000 mg | Freq: Once | INTRAMUSCULAR | Status: DC | PRN

## 2012-07-29 SURGICAL SUPPLY — 66 items
BANDAGE ELASTIC 4 VELCRO ST LF (GAUZE/BANDAGES/DRESSINGS) ×2 IMPLANT
BANDAGE ELASTIC 6 VELCRO ST LF (GAUZE/BANDAGES/DRESSINGS) ×2 IMPLANT
BANDAGE ESMARK 6X9 LF (GAUZE/BANDAGES/DRESSINGS) ×1 IMPLANT
BLADE SAG 18X100X1.27 (BLADE) ×4 IMPLANT
BNDG ESMARK 6X9 LF (GAUZE/BANDAGES/DRESSINGS) ×2
BOWL SMART MIX CTS (DISPOSABLE) ×2 IMPLANT
CEMENT BONE SIMPLEX SPEEDSET (Cement) ×4 IMPLANT
CLOTH BEACON ORANGE TIMEOUT ST (SAFETY) ×2 IMPLANT
COVER SURGICAL LIGHT HANDLE (MISCELLANEOUS) ×2 IMPLANT
CUFF TOURNIQUET SINGLE 34IN LL (TOURNIQUET CUFF) ×2 IMPLANT
DRAPE EXTREMITY T 121X128X90 (DRAPE) ×2 IMPLANT
DRAPE PROXIMA HALF (DRAPES) ×2 IMPLANT
DRAPE U-SHAPE 47X51 STRL (DRAPES) ×2 IMPLANT
DRSG PAD ABDOMINAL 8X10 ST (GAUZE/BANDAGES/DRESSINGS) ×2 IMPLANT
DURAPREP 26ML APPLICATOR (WOUND CARE) ×2 IMPLANT
ELECT CAUTERY BLADE 6.4 (BLADE) ×2 IMPLANT
ELECT REM PT RETURN 9FT ADLT (ELECTROSURGICAL) ×2
ELECTRODE REM PT RTRN 9FT ADLT (ELECTROSURGICAL) ×1 IMPLANT
EVACUATOR 1/8 PVC DRAIN (DRAIN) ×2 IMPLANT
FACESHIELD LNG OPTICON STERILE (SAFETY) ×4 IMPLANT
GAUZE XEROFORM 5X9 LF (GAUZE/BANDAGES/DRESSINGS) ×2 IMPLANT
GLOVE ORTHO TXT STRL SZ7.5 (GLOVE) ×2 IMPLANT
GLOVE SURG SS PI 6.0 STRL IVOR (GLOVE) IMPLANT
GLOVE SURG SS PI 7.0 STRL IVOR (GLOVE) ×2 IMPLANT
GLOVE SURG SS PI 8.5 STRL IVOR (GLOVE) ×2
GLOVE SURG SS PI 8.5 STRL STRW (GLOVE) ×2 IMPLANT
GOWN PREVENTION PLUS XLARGE (GOWN DISPOSABLE) ×4 IMPLANT
GOWN SRG XL XLNG 56XLVL 4 (GOWN DISPOSABLE) ×2 IMPLANT
GOWN STRL NON-REIN LRG LVL3 (GOWN DISPOSABLE) IMPLANT
GOWN STRL NON-REIN XL XLG LVL4 (GOWN DISPOSABLE) ×2
GOWN STRL REIN 2XL XLG LVL4 (GOWN DISPOSABLE) IMPLANT
HANDPIECE INTERPULSE COAX TIP (DISPOSABLE) ×1
IMMOBILIZER KNEE 22 UNIV (SOFTGOODS) ×2 IMPLANT
IMMOBILIZER KNEE 24 THIGH 36 (MISCELLANEOUS) IMPLANT
IMMOBILIZER KNEE 24 UNIV (MISCELLANEOUS)
KIT BASIN OR (CUSTOM PROCEDURE TRAY) ×2 IMPLANT
KIT ROOM TURNOVER OR (KITS) ×2 IMPLANT
KNEE ORTHOSENSOR TRIATH SZ 3 (DISPOSABLE) ×2 IMPLANT
KNEE/VIT E POLY LINER LEVEL 1B ×2 IMPLANT
MANIFOLD NEPTUNE II (INSTRUMENTS) ×2 IMPLANT
NEEDLE 18GX1X1/2 (RX/OR ONLY) (NEEDLE) IMPLANT
NEEDLE 22X1 1/2 (OR ONLY) (NEEDLE) ×2 IMPLANT
NEEDLE HYPO 25GX1X1/2 BEV (NEEDLE) ×2 IMPLANT
NS IRRIG 1000ML POUR BTL (IV SOLUTION) ×2 IMPLANT
PACK TOTAL JOINT (CUSTOM PROCEDURE TRAY) ×2 IMPLANT
PAD ARMBOARD 7.5X6 YLW CONV (MISCELLANEOUS) ×4 IMPLANT
PAD CAST 4YDX4 CTTN HI CHSV (CAST SUPPLIES) ×1 IMPLANT
PADDING CAST COTTON 4X4 STRL (CAST SUPPLIES) ×1
PADDING CAST COTTON 6X4 STRL (CAST SUPPLIES) ×2 IMPLANT
RUBBERBAND STERILE (MISCELLANEOUS) IMPLANT
SET HNDPC FAN SPRY TIP SCT (DISPOSABLE) ×1 IMPLANT
SPONGE GAUZE 4X4 12PLY (GAUZE/BANDAGES/DRESSINGS) ×2 IMPLANT
STAPLER VISISTAT 35W (STAPLE) ×2 IMPLANT
SUCTION FRAZIER TIP 10 FR DISP (SUCTIONS) IMPLANT
SUT VIC AB 0 CT1 27 (SUTURE) ×1
SUT VIC AB 0 CT1 27XBRD ANBCTR (SUTURE) ×1 IMPLANT
SUT VIC AB 1 CTX 36 (SUTURE) ×2
SUT VIC AB 1 CTX36XBRD ANBCTR (SUTURE) ×2 IMPLANT
SUT VIC AB 2-0 CT1 27 (SUTURE) ×2
SUT VIC AB 2-0 CT1 TAPERPNT 27 (SUTURE) ×2 IMPLANT
SYR CONTROL 10ML LL (SYRINGE) ×2 IMPLANT
TOWEL OR 17X24 6PK STRL BLUE (TOWEL DISPOSABLE) ×2 IMPLANT
TOWEL OR 17X26 10 PK STRL BLUE (TOWEL DISPOSABLE) ×2 IMPLANT
TRAY FOLEY CATH 14FR (SET/KITS/TRAYS/PACK) ×2 IMPLANT
TRAY FOLEY CATH 16FRSI W/METER (SET/KITS/TRAYS/PACK) IMPLANT
WATER STERILE IRR 1000ML POUR (IV SOLUTION) ×2 IMPLANT

## 2012-07-29 NOTE — Anesthesia Procedure Notes (Addendum)
Procedure Name: LMA Insertion Date/Time: 07/29/2012 11:43 AM Performed by: Darcey Nora B Pre-anesthesia Checklist: Patient identified, Emergency Drugs available, Suction available and Patient being monitored Patient Re-evaluated:Patient Re-evaluated prior to inductionOxygen Delivery Method: Circle system utilized Preoxygenation: Pre-oxygenation with 100% oxygen Intubation Type: IV induction Ventilation: Mask ventilation without difficulty LMA: LMA inserted LMA Size: 4.0 Number of attempts: 1 Placement Confirmation: breath sounds checked- equal and bilateral and positive ETCO2 Tube secured with: Tape (across cheeks) Dental Injury: Teeth and Oropharynx as per pre-operative assessment    Anesthesia Regional Block:  Femoral nerve block  Pre-Anesthetic Checklist: ,, timeout performed, Correct Patient, Correct Site, Correct Laterality, Correct Procedure, Correct Position, site marked, Risks and benefits discussed,  Surgical consent,  Pre-op evaluation,  At surgeon's request and post-op pain management  Laterality: Right  Prep: chloraprep       Needles:   Needle Type: Echogenic Needle     Needle Length:cm 9 cm Needle Gauge: 22 and 22 G    Additional Needles: Femoral nerve block Narrative:  Start time: 07/29/2012 10:30 AM End time: 07/29/2012 10:35 AM Injection made incrementally with aspirations every 5 mL.  Performed by: Personally   Additional Notes: R. Adductor canal block 15 cc 0.5% marcaine 1:200 Epi injected easily.  Kipp Brood  Femoral nerve block

## 2012-07-29 NOTE — Anesthesia Postprocedure Evaluation (Signed)
Anesthesia Post Note  Patient: Susan [name]  Procedure(s) Performed: Procedure(s) (LRB): TOTAL KNEE ARTHROPLASTY (Right)  Anesthesia type: General  Patient location: PACU  Post pain: Pain level controlled  Post assessment: Patient's Cardiovascular Status Stable  Last Vitals:  Filed Vitals:   07/29/12 1545  BP: 149/72  Pulse: 75  Temp:   Resp: 12    Post vital signs: Reviewed and stable  Level of consciousness: alert  Complications: No apparent anesthesia complications

## 2012-07-29 NOTE — Progress Notes (Signed)
Placed pt. On CPAP via nasal mask per order. Auto titrate settings (min 5.0- max 18.0) with 3 lpm O2 bleed in.  Pt. Is tolerating well at this time.

## 2012-07-29 NOTE — Progress Notes (Signed)
ANTICOAGULATION CONSULT NOTE - Initial Consult  Pharmacy Consult for warfarin Indication: VTE prophylaxis  Allergies  Allergen Reactions  . Ace Inhibitors Cough    Pt unsure of this as an allergy  . Lipitor (Atorvastatin) Other (See Comments)    edema  . Simvastatin Other (See Comments)    Stiffness     Patient Measurements:    Vital Signs: Temp: 98.3 F (36.8 C) (07/23 1614) Temp src: Oral (07/23 1614) BP: 163/74 mmHg (07/23 1614) Pulse Rate: 73 (07/23 1614)  Labs: No results found for this basename: HGB, HCT, PLT, APTT, LABPROT, INR, HEPARINUNFRC, CREATININE, CKTOTAL, CKMB, TROPONINI,  in the last 72 hours  The CrCl is unknown because both a height and weight (above a minimum accepted value) are required for this calculation.   Medical History: Past Medical History  Diagnosis Date  . Hypertension   . Hyperlipidemia   . Depression   . Pedal edema     none in last 6 months  . Spinal stenosis   . Varicose veins   . Arthritis   . Primary hyperparathyroidism   . Thyroid disease   . Osteoarthritis of right knee   . Sleep apnea   . Incontinent of urine     wears pads  . GERD (gastroesophageal reflux disease)     Medications:  Prescriptions prior to admission  Medication Sig Dispense Refill  . ALPRAZolam (XANAX) 0.25 MG tablet Take 0.25 mg by mouth at bedtime as needed for sleep.       Marland Kitchen buPROPion (WELLBUTRIN) 100 MG tablet Take 100 mg by mouth 2 (two) times daily.      . calcium-vitamin D (OSCAL WITH D) 500-200 MG-UNIT per tablet Take 1 tablet by mouth daily.      Marland Kitchen HYDROcodone-acetaminophen (NORCO/VICODIN) 5-325 MG per tablet Take 1-2 tablets by mouth every 4 (four) hours as needed for pain.  20 tablet  1  . ibuprofen (ADVIL,MOTRIN) 200 MG tablet Take 400 mg by mouth every 6 (six) hours as needed for pain.      . Multiple Vitamin (MULTIVITAMIN WITH MINERALS) TABS Take 1 tablet by mouth daily.      Marland Kitchen omeprazole (PRILOSEC) 40 MG capsule Take 40 mg by mouth  daily.       Marland Kitchen OVER THE COUNTER MEDICATION Take 1 tablet by mouth 3 (three) times daily. otc eye vitamin      . sertraline (ZOLOFT) 100 MG tablet Take 100 mg by mouth every morning.       . valsartan-hydrochlorothiazide (DIOVAN-HCT) 160-12.5 MG per tablet Take 1 tablet by mouth every morning.      . vitamin B-12 (CYANOCOBALAMIN) 100 MCG tablet Take 100 mcg by mouth daily.      . Vitamin D, Ergocalciferol, (DRISDOL) 50000 UNITS CAPS Take 50,000 Units by mouth every 7 (seven) days.        Assessment: 54 yof s/p R TKA to start warfarin for VTE prophylaxis. Baseline INR and CBC are WNL as of 7/17. Pt is also on lovenox for VTE proph.   Goal of Therapy:  INR 2-3   Plan:  1. Warfarin 5mg  PO x 1 tonight 2. Daily INR 3. Warfarin book + video to patient  Adolfo Granieri, Drake Leach 07/29/2012,4:29 PM

## 2012-07-29 NOTE — Interval H&P Note (Signed)
History and Physical Interval Note:  07/29/2012 9:59 AM  Susan [name]  has presented today for surgery, with the diagnosis of DJD RIGHT KNEE  The various methods of treatment have been discussed with the patient and family. After consideration of risks, benefits and other options for treatment, the patient has consented to  Procedure(s): TOTAL KNEE ARTHROPLASTY (Right) as a surgical intervention .  The patient's history has been reviewed, patient examined, no change in status, stable for surgery.  I have reviewed the patient's chart and labs.  Questions were answered to the patient's satisfaction.     MURPHY,DANIEL F

## 2012-07-29 NOTE — Progress Notes (Signed)
Orthopedic Tech Progress Note Patient Details:  Susan [name] [birth year] [id number]  CPM Right Knee CPM Right Knee: On Right Knee Flexion (Degrees): 60 Right Knee Extension (Degrees): 0 Additional Comments: Trapeze bar   Cammer, Mickie Bail 07/29/2012, 3:22 PM

## 2012-07-29 NOTE — Anesthesia Preprocedure Evaluation (Addendum)
Anesthesia Evaluation  Patient identified by MRN, date of birth, ID band Patient awake    Reviewed: Allergy & Precautions, H&P , NPO status , Patient's Chart, lab work & pertinent test results, reviewed documented beta blocker date and time   Airway Mallampati: II TM Distance: >3 FB Neck ROM: Full    Dental  (+) Teeth Intact and Dental Advisory Given   Pulmonary  breath sounds clear to auscultation        Cardiovascular hypertension, Pt. on medications Rhythm:Regular Rate:Normal     Neuro/Psych    GI/Hepatic GERD-  Medicated and Controlled,  Endo/Other    Renal/GU      Musculoskeletal   Abdominal   Peds  Hematology   Anesthesia Other Findings Gaps b/t teeth and upper teeth protrude...  Reproductive/Obstetrics                         Anesthesia Physical Anesthesia Plan  ASA: III  Anesthesia Plan: General   Post-op Pain Management:    Induction: Intravenous  Airway Management Planned: LMA  Additional Equipment:   Intra-op Plan:   Post-operative Plan:   Informed Consent: I have reviewed the patients History and Physical, chart, labs and discussed the procedure including the risks, benefits and alternatives for the proposed anesthesia with the patient or authorized representative who has indicated his/her understanding and acceptance.   Dental advisory given  Plan Discussed with:   Anesthesia Plan Comments:         Anesthesia Quick Evaluation

## 2012-07-29 NOTE — Transfer of Care (Signed)
Immediate Anesthesia Transfer of Care Note  Patient: Susan [name]  Procedure(s) Performed: Procedure(s): TOTAL KNEE ARTHROPLASTY (Right)  Patient Location: PACU  Anesthesia Type:General  Level of Consciousness: awake and patient cooperative  Airway & Oxygen Therapy: Patient Spontanous Breathing, Patient connected to nasal cannula oxygen and Patient connected to face mask oxygen  Post-op Assessment: Report given to PACU RN, Post -op Vital signs reviewed and stable and Patient moving all extremities  Post vital signs: Reviewed and stable  Complications: No apparent anesthesia complications

## 2012-07-29 NOTE — Preoperative (Signed)
Beta Blockers   Reason not to administer Beta Blockers:Not Applicable 

## 2012-07-29 NOTE — Plan of Care (Signed)
Problem: Consults Goal: Diagnosis- Total Joint Replacement Primary Total Knee Right     

## 2012-07-30 ENCOUNTER — Encounter (HOSPITAL_COMMUNITY): Payer: Self-pay | Admitting: Orthopedic Surgery

## 2012-07-30 LAB — CBC
MCHC: 34.8 g/dL (ref 30.0–36.0)
Platelets: 207 10*3/uL (ref 150–400)
RDW: 13.8 % (ref 11.5–15.5)
WBC: 8.4 10*3/uL (ref 4.0–10.5)

## 2012-07-30 LAB — BASIC METABOLIC PANEL
BUN: 11 mg/dL (ref 6–23)
Chloride: 100 mEq/L (ref 96–112)
Creatinine, Ser: 0.79 mg/dL (ref 0.50–1.10)
GFR calc Af Amer: >90 mL/min (ref >90)
GFR calc non Af Amer: 80 mL/min — ABNORMAL LOW (ref >90)
Potassium: 4.1 mEq/L (ref 3.5–5.1)

## 2012-07-30 LAB — PROTIME-INR: Prothrombin Time: 13.9 seconds (ref 11.6–15.2)

## 2012-07-30 MED ORDER — WARFARIN SODIUM 5 MG PO TABS
5.0000 mg | ORAL_TABLET | Freq: Once | ORAL | Status: AC
  Administered 2012-07-30: 5 mg via ORAL
  Filled 2012-07-30: qty 1

## 2012-07-30 NOTE — Progress Notes (Signed)
RT in room. Pt. Off CPAP from earlier for a snack.  Pt. Refused CPAP at this time. States it is cumbersome and she doesn't like the noise.  She recently had a sleep study but states she has no plans at this time to wear CPAP at home. RN aware.

## 2012-07-30 NOTE — Progress Notes (Signed)
UR COMPLETED  

## 2012-07-30 NOTE — Clinical Social Work Placement (Addendum)
Progress Notes Service date: 07/30/2012 5:26 PM   Clinical Social Work Department  CLINICAL SOCIAL WORK PLACEMENT NOTE  07/30/2012  Patient: Susan [name] Account Number: [id number] Admit date: 07/29/12 Clinical Social Worker: Sabino Niemann MSW Date/time: 07/30/2012 2:30 PM  Clinical Social Work is seeking post-discharge placement for this patient at the following level of care: SKILLED NURSING (*CSW will update this form in Epic as items are completed)  07/30/2012 Patient/family provided with [facility] Department of Clinical Social Work's list of facilities offering this level of care within the geographic area requested by the patient (or if unable, by the patient's family).  07/30/2012 Patient/family informed of their freedom to choose among providers that offer the needed level of care, that participate in Medicare, Medicaid or managed care program needed by the patient, have an available bed and are willing to accept the patient.  07/30/2012 Patient/family informed of [facility]' ownership interest in [facility], as well as of the fact that they are under no obligation to receive care at this facility.  PASARR submitted to EDS on 07/30/2012  PASARR number received from [employer] on  FL2 transmitted to all facilities in geographic area requested by pt/family on7/24/2014  FL2 transmitted to all facilities within larger geographic area on  Patient informed that his/her managed care company has contracts with or will negotiate with certain facilities, including the following:  Patient/family informed of bed offers received: 07/30/12 Patient chooses bed at  [facility] Physician recommends and patient chooses bed at  Patient to be transferred to on 07/31/12 Patient to be transferred to facility by [facility] The following physician request were entered in Epic:  Additional Comments:   Signing off!

## 2012-07-30 NOTE — Progress Notes (Signed)
Subjective: 1 Day Post-Op Procedure(s) (LRB): TOTAL KNEE ARTHROPLASTY (Right) Patient reports pain as 4 on 0-10 scale.   Voiding without difficlty  Objective: Vital signs in last 24 hours: Temp:  [98 F (36.7 C)-98.7 F (37.1 C)] 98.7 F (37.1 C) (07/23 2106) Pulse Rate:  [54-80] 72 (07/23 2234) Resp:  [9-21] 18 (07/24 0800) BP: (142-192)/(59-91) 142/59 mmHg (07/23 2106) SpO2:  [92 %-100 %] 93 % (07/24 0800)  Intake/Output from previous day: 07/23 0701 - 07/24 0700 In: 3331.7 [P.O.:600; I.V.:2731.7] Out: 1375 [Urine:750; Drains:575; Blood:50] Intake/Output this shift: Total I/O In: -  Out: 120 [Urine:120]   Recent Labs  07/29/12 1654 07/30/12 0500  HGB 11.4* 11.3*    Recent Labs  07/29/12 1654 07/30/12 0500  WBC 12.0* 8.4  RBC 3.77* 3.61*  HCT 34.1* 32.5*  PLT 197 207    Recent Labs  07/29/12 1654 07/30/12 0500  NA  --  135  K  --  4.1  CL  --  100  CO2  --  25  BUN  --  11  CREATININE 0.83 0.79  GLUCOSE  --  165*  CALCIUM  --  9.4    Recent Labs  07/30/12 0500  INR 1.09    Intact pulses distally Dorsiflexion/Plantar flexion intact Compartment soft hemovac discontinued  Assessment/Plan: 1 Day Post-Op Procedure(s) (LRB): TOTAL KNEE ARTHROPLASTY (Right) Advance diet Up with therapy D/C IV fluids  ROBERTS,JANE B 07/30/2012, 11:05 AM

## 2012-07-30 NOTE — Progress Notes (Signed)
Clinical Social Work Department  BRIEF PSYCHOSOCIAL ASSESSMENT  Patient:  Account Number:   Admit date:  Clinical Social Worker Sabino Niemann, MSW Date/Time:  Referred by: Physician Date Referred:  Referred for   SNF Placement   Other Referral:  Interview type: Patient  Other interview type: PSYCHOSOCIAL DATA  Living Status: alone Admitted from facility:  Level of care:  Primary support name: Harp,[name]  Primary support relationship to patient: daughter  Degree of support available:  Strong and vested  CURRENT CONCERNS  Current Concerns   Post-Acute Placement   Other Concerns:  SOCIAL WORK ASSESSMENT / PLAN  CSW met with pt re: PT recommendation for SNF.   Pt lives alone  CSW explained placement process and answered questions.   Pt reports [facility]  as her preference    CSW completed FL2 and initiated SNF search.     Assessment/plan status: Information/Referral to [facility]  Other assessment/ plan:  Information/referral to community resources:  SNF   PTAR  PATIENT'S/FAMILY'S RESPONSE TO PLAN OF CARE:  Pt  reports she is agreeable to ST SNF in order to increase strength and independence with mobility prior to returning home  Pt verbalized understanding of placement process and appreciation for CSW assist.   Sabino Niemann, MSW [phone]

## 2012-07-30 NOTE — Progress Notes (Signed)
Patient refuses CPAP. RT will monitor. 

## 2012-07-30 NOTE — Op Note (Signed)
NAMEMarland Kitchen  Susan [name], Susan [name] NO.:  [id number]  MEDICAL RECORD NO.:  [id number]  LOCATION:  [id number]                        FACILITY:  [facility]  PHYSICIAN:  Loreta Ave, M.D. DATE OF BIRTH:  [birth year]  DATE OF PROCEDURE:  07/29/2012 DATE OF DISCHARGE:                              OPERATIVE REPORT   PREOPERATIVE DIAGNOSES: 1. Right knee end-stage degenerative arthritis. 2. Increased valgus with mild soft tissue valgus contracture. 3. Bone loss lateral compartment, primarily on the femur.  POSTOPERATIVE DIAGNOSIS: 1. Right knee end-stage degenerative arthritis. 2. Increased valgus with mild soft tissue valgus contracture. 3. Bone loss lateral compartment, primarily on the femur.  PROCEDURE:  Right knee modified minimally invasive total knee replacement, Stryker triathlon prosthesis.  Soft tissue balancing. Computer-assisted with the Ortho sensor computer assist device.  SURGEON:  Loreta Ave, MD  ASSISTANT:  Margarita Rana, MD, present throughout the entire case and necessary for timely completion of procedure.  ANESTHESIA:  General.  ESTIMATED BLOOD LOSS:  Minimal.  SPECIMENS:  None.  COUNTS:  None.  COMPLICATIONS:  None.  COMPLICATION:  Soft compressive knee immobilizer.  DRAINS:  Hemovac x1.  TOURNIQUET TIME:  1 hour and 10 minutes.  DESCRIPTION OF PROCEDURE:  The patient was brought to the operating room, placed on the operating table in supine position.  After adequate anesthesia had been obtained, tourniquet applied.  Prepped and draped in usual sterile fashion.  Exsanguinated with elevation of Esmarch. Tourniquet inflated to 350 mmHg.  I could get within about 3 degrees of full extension.  Increased valgus partially correctable.  Straight incision above the patella down to tibial tubercle.  Hemostasis with cautery.  Medial arthrotomy, vastus splitting, preserving quad tendon. Knee exposed.  Medial capsule release.  Marked erosive  changes, primarily lateral femoral condyle.  Intramedullary guide distal femur. 10-mm resection, 5 degrees of valgus.  Using epicondylar axis, the femur was sized, cut, and fitted for #4 femoral component.  Tibia exposed. Extramedullary guide placed.  A 3-degree posterior slope cut.  Sized for #3 component.  At this point, I put in the femoral tibial trials as well as the Ortho sensor device.  I was looking for balancing in flexion, extension as well as medial to lateral.  She was overloaded laterally both in full extension, as well as flexion.  Thoroughly assessed my cuts and soft tissue releases.  I felt that my proximal tibial cut, was tilted into varus enough, adjusting that cut could re-balance the knee. Trials were removed.  I then did a modification of the proximal tibial cut, taking about a mm down off the lateral side from medial to lateral. The knee was irrigated.  Trials put in place again.  With a single adjustment, I now had a nicely balanced knee, side-to-side from full extension, full flexion.  The trials as well as well the Ortho sensor device held me set the rotation of the tibial components, this was nicely balanced as well.  All trials removed.  The tibia was punched for the definitive component utilizing the rotation set by the Ortho sensor device.  Copious irrigation of the knee.  Patella exposed.  Posterior 10 mm removed.  Drilled, sized, and fitted for a 35-mm component.  All trials have been removed.  Copious irrigation.  Cement prepared, placed on all components.  All components were well seated #4 on the femur, #3 on the tibia, 9-mm insert, and a 35 component on patella.  Once the cement hardened, I reexamined the knee.  Again, pleased with biomechanical axis, balance in flexion extension, patellofemoral tracking.  Hemovac was placed and brought through a separate stab wound. Soft tissues injected with Exparel.  Arthrotomy closed with #1 Vicryl. Skin and  subcutaneous tissue with Vicryl and staples.  Sterile compressive dressing applied.  Margin of the wound had been injected with Marcaine before the dressing.  Tourniquet deflated and removed. Knee immobilizer applied.  Anesthesia reversed.  Brought to the recovery room.  Tolerated the surgery well.  No complications.     Loreta Ave, M.D.     DFM/MEDQ  D:  07/29/2012  T:  07/30/2012  Job:  [id number]

## 2012-07-30 NOTE — Evaluation (Signed)
Physical Therapy Evaluation Patient Details Name: Susan [name] MRN: [id number] DOB: [birth year] Today's Date: 07/30/2012 Time: 4098-1191 PT Time Calculation (min): 27 min  PT Assessment / Plan / Recommendation History of Present Illness  s/p elective R TKA  Clinical Impression  Pt is s/p R TKA POD#1 resulting in the deficits listed below (see PT Problem List). Pt will benefit from skilled PT to increase their independence and safety with mobility to allow discharge to the venue listed below. Pt limited mobility today due to R LE buckling.    PT Assessment  Patient needs continued PT services    Follow Up Recommendations  SNF;Supervision/Assistance - 24 hour    Does the patient have the potential to tolerate intense rehabilitation      Barriers to Discharge Decreased caregiver support pt lives alone    Equipment Recommendations  Other (comment) (TBD at [facility])    Recommendations for Other Services     Frequency 7X/week    Precautions / Restrictions Precautions Precautions: Fall;Knee Precaution Comments: given TKA exercise protocol handout  Restrictions Weight Bearing Restrictions: Yes RLE Weight Bearing: Weight bearing as tolerated   Pertinent Vitals/Pain 8/10; given IV pain medicine by RN       Mobility  Bed Mobility Bed Mobility: Supine to Sit;Sitting - Scoot to Edge of Bed Supine to Sit: 4: Min assist;HOB elevated;With rails Sitting - Scoot to Edge of Bed: 4: Min assist Details for Bed Mobility Assistance: pt requires increased time due to pain; (A) to advance R LE; vc's for hand placement and sequencing Transfers Transfers: Sit to Stand;Stand to Sit;Stand Pivot Transfers Sit to Stand: 3: Mod assist;From bed;From elevated surface;With upper extremity assist Stand to Sit: 3: Mod assist;To chair/3-in-1;With upper extremity assist Stand Pivot Transfers: 3: Mod assist;From elevated surface;With armrests Details for Transfer Assistance: sit <> stand x2; pt requires max  cues for hand placement and sequencing; pt tends to pull to stand on RW; max encouragement for upright posture; pt tends to lean posteriorly and becomes off balance without (A); pt attempted pre-gait activities; unable to safely amb due to R LE buckling and limited (A); will benefit from 2+ (A) for gt activities; pt required incrased time and max cues to pivot to chair; pt with difficulties WB through R LE Ambulation/Gait Ambulation/Gait Assistance: Not tested (comment) (pre gt activites attempted ) General Gait Details: R LE buckling with pre-gt activities; 2+ (A) for safety with amb at next session  Stairs: No Wheelchair Mobility Wheelchair Mobility: No    Exercises Total Joint Exercises Ankle Circles/Pumps: AROM;10 reps;Supine Quad Sets: AROM;Right;10 reps;Seated Heel Slides: AAROM;Right;10 reps;Seated Straight Leg Raises: AAROM;5 reps;Right;Seated Long Arc Quad: AAROM;Right;10 reps;Seated Goniometric ROM: knee flex AAROM 60 degrees limited by pain in sitting   PT Diagnosis: Difficulty walking;Acute pain  PT Problem List: Decreased strength;Decreased range of motion;Decreased balance;Decreased activity tolerance;Decreased mobility;Decreased knowledge of use of DME;Decreased safety awareness;Pain;Decreased knowledge of precautions PT Treatment Interventions: DME instruction;Gait training;Functional mobility training;Therapeutic activities;Therapeutic exercise;Balance training;Neuromuscular re-education;Patient/family education     PT Goals(Current goals can be found in the care plan section) Acute Rehab PT Goals Patient Stated Goal: to go to [place] for rehab PT Goal Formulation: With patient Time For Goal Achievement: 08/06/12 Potential to Achieve Goals: Good  Visit Information  Last PT Received On: 07/30/12 Assistance Needed: +2 History of Present Illness: s/p elective R TKA       Prior Functioning  Home Living Family/patient expects to be discharged to:: Other (Comment)  [facility] H[facility]) Living Arrangements: Alone Additional Comments:  Pt lives alone when her grandchildren are at college; she plans to D/C to [facility]; arrangements have been made Prior Function Level of Independence: Independent Communication Communication: No difficulties    Cognition  Cognition Arousal/Alertness: Awake/alert Behavior During Therapy: WFL for tasks assessed/performed Overall Cognitive Status: Within Functional Limits for tasks assessed    Extremity/Trunk Assessment Upper Extremity Assessment Upper Extremity Assessment: Defer to OT evaluation Lower Extremity Assessment Lower Extremity Assessment: RLE deficits/detail RLE Deficits / Details: ankle DF/PF WFL; knee grossly 2/5 RLE: Unable to fully assess due to pain RLE Sensation:  (WFL to light touch ) Cervical / Trunk Assessment Cervical / Trunk Assessment: Normal   Balance Balance Balance Assessed: Yes Static Sitting Balance Static Sitting - Balance Support: Bilateral upper extremity supported;Feet supported Static Sitting - Level of Assistance: 5: Stand by assistance;4: Min assist Static Sitting - Comment/# of Minutes: initially pt required (A) to maitnain upright position; pt with posterior lean; able to correct with cues and progress to stand by (A)   End of Session PT - End of Session Equipment Utilized During Treatment: Gait belt;Right knee immobilizer Activity Tolerance: Patient tolerated treatment well Patient left: in chair;with call bell/phone within reach Nurse Communication: Mobility status;Patient requests pain meds CPM Right Knee CPM Right Knee: [address]  GP     Donnamarie Poag [place], [place] [phone] 07/30/2012, 8:48 AM

## 2012-07-30 NOTE — Progress Notes (Signed)
ANTICOAGULATION CONSULT NOTE - Follow Up Consult  Pharmacy Consult for Coumadin Indication: VTE prophylaxis  Allergies  Allergen Reactions  . Ace Inhibitors Cough    Pt unsure of this as an allergy  . Lipitor (Atorvastatin) Other (See Comments)    edema  . Simvastatin Other (See Comments)    Stiffness     Patient Measurements: Weight: 90 kg  Vital Signs: Temp: 99.1 F (37.3 C) (07/24 1250) Temp src: Oral (07/24 1250) BP: 146/66 mmHg (07/24 1250)  Labs:  Recent Labs  07/29/12 1654 07/30/12 0500  HGB 11.4* 11.3*  HCT 34.1* 32.5*  PLT 197 207  LABPROT  --  13.9  INR  --  1.09  CREATININE 0.83 0.79    The CrCl is unknown because both a height and weight (above a minimum accepted value) are required for this calculation.  Assessment: 72 y/o female on Coumadin s/p R TKA for VTE prophylaxis. INR is subtherapeutic at 1.09. No bleeding noted, CBC stable. Patient also on Lovenox 30 mg q12h until INR >= 1.8.  Goal of Therapy:  INR 2-3   Plan:  -Coumadin 5 mg PO tonight -INR daily -Monitor for signs/symptoms of bleeding  [facility], [address].D., BCPS Clinical Pharmacist Pager: [phone] 07/30/2012 1:52 PM

## 2012-07-31 ENCOUNTER — Other Ambulatory Visit: Payer: Self-pay | Admitting: Geriatric Medicine

## 2012-07-31 LAB — CBC
HCT: 30.3 % — ABNORMAL LOW (ref 36.0–46.0)
Hemoglobin: 10.5 g/dL — ABNORMAL LOW (ref 12.0–15.0)
MCV: 89.9 fL (ref 78.0–100.0)
RBC: 3.37 MIL/uL — ABNORMAL LOW (ref 3.87–5.11)
RDW: 14 % (ref 11.5–15.5)
WBC: 9.7 10*3/uL (ref 4.0–10.5)

## 2012-07-31 LAB — BASIC METABOLIC PANEL
BUN: 10 mg/dL (ref 6–23)
CO2: 26 mEq/L (ref 19–32)
Chloride: 104 mEq/L (ref 96–112)
Creatinine, Ser: 0.78 mg/dL (ref 0.50–1.10)
GFR calc Af Amer: >90 mL/min (ref >90)
Potassium: 4 mEq/L (ref 3.5–5.1)

## 2012-07-31 MED ORDER — ENOXAPARIN SODIUM 30 MG/0.3ML ~~LOC~~ SOLN: SUBCUTANEOUS | Status: DC

## 2012-07-31 MED ORDER — OXYCODONE HCL 5 MG PO TABS: ORAL_TABLET | ORAL | Status: DC

## 2012-07-31 MED ORDER — BISACODYL 10 MG RE SUPP
10.0000 mg | Freq: Two times a day (BID) | RECTAL | Status: DC
  Administered 2012-07-31: 10 mg via RECTAL
  Filled 2012-07-31: qty 1

## 2012-07-31 MED ORDER — DSS 100 MG PO CAPS: 100.0000 mg | ORAL_CAPSULE | Freq: Two times a day (BID) | ORAL | Status: DC

## 2012-07-31 MED ORDER — WARFARIN SODIUM 5 MG PO TABS: ORAL_TABLET | ORAL | Status: DC

## 2012-07-31 NOTE — Evaluation (Signed)
Occupational Therapy Evaluation Patient Details Name: Susan [name] MRN: [id number] DOB: 04-26-1940 Today's Date: 07/31/2012 Time: 9147-8295 OT Time Calculation (min): 35 min  OT Assessment / Plan / Recommendation History of present illness s/p elective R TKA   Clinical Impression   Pt demos decline in function with ADLs and ADL mobility following R TKA. Pt would benefit from acute OT services to maximize level of function and safety to return home     OT Assessment  Patient needs continued OT Services    Follow Up Recommendations  SNF;Supervision/Assistance - 24 hour    Barriers to Discharge Decreased caregiver support Pt lives with her granschildren that are in college  Equipment Recommendations  None recommended by OT;Other (comment) (TBD at SNF level of care)    Recommendations for Other Services    Frequency  Min 2X/week    Precautions / Restrictions Precautions Precautions: Fall;Knee Restrictions Weight Bearing Restrictions: Yes RLE Weight Bearing: Weight bearing as tolerated   Pertinent Vitals/Pain 5/10 R knee    ADL  Grooming: Performed;Wash/dry hands;Wash/dry face;Min guard Where Assessed - Grooming: Supported standing Upper Body Bathing: Simulated;Set up;Supervision/safety Lower Body Bathing: Simulated;Maximal assistance Upper Body Dressing: Performed;Supervision/safety;Set up Lower Body Dressing: Performed;Maximal assistance Toilet Transfer: Performed;Minimal assistance;Moderate assistance Toilet Transfer Method: Sit to stand Toilet Transfer Equipment: Raised toilet seat with arms (or 3-in-1 over toilet);Grab bars Toileting - Clothing Manipulation and Hygiene: Performed;Moderate assistance Where Assessed - Toileting Clothing Manipulation and Hygiene: Standing Equipment Used: Gait belt;Rolling walker;Knee Immobilizer;Other (comment) (3 in 1 over toilet) Transfers/Ambulation Related to ADLs: cues for correct hand placement and min A with R LE to advance using  RW ADL Comments: pt provided with educaton on DME for bathroom and ADL A/E    OT Diagnosis: Generalized weakness;Acute pain  OT Problem List: Decreased strength;Decreased knowledge of use of DME or AE;Decreased knowledge of precautions;Impaired balance (sitting and/or standing);Pain;Decreased activity tolerance OT Treatment Interventions: Self-care/ADL training;Therapeutic exercise;Neuromuscular education;Balance training;Patient/family education;DME and/or AE instruction;Therapeutic activities   OT Goals(Current goals can be found in the care plan section) Acute Rehab OT Goals Patient Stated Goal: to go to [place] for rehab OT Goal Formulation: With patient Time For Goal Achievement: 08/07/12 Potential to Achieve Goals: Good ADL Goals Pt Will Perform Grooming: with set-up;with supervision;standing Pt Will Perform Lower Body Bathing: with mod assist Pt Will Perform Lower Body Dressing: with mod assist Pt Will Transfer to Toilet: with min guard assist;with min assist;grab bars (3 in 1 over toilet) Pt Will Perform Toileting - Clothing Manipulation and hygiene: with min guard assist;with min assist;sitting/lateral leans;sit to/from stand  Visit Information  Last OT Received On: 07/31/12 Assistance Needed: +1 PT/OT Co-Evaluation/Treatment: Yes History of Present Illness: s/p elective R TKA       Prior Functioning     Home Living Family/patient expects to be discharged to:: Skilled nursing facility Living Arrangements: Alone Additional Comments: Pt lives alone when her grandchildren are at college; she plans to D/C to [facility]; arrangements have been made Prior Function Level of Independence: Independent Communication Communication: No difficulties Dominant Hand: Right         Vision/Perception Vision - History Baseline Vision: Wears glasses all the time Patient Visual Report: No change from baseline Perception Perception: Within Functional Limits   Cognition   Cognition Arousal/Alertness: Awake/alert Behavior During Therapy: WFL for tasks assessed/performed Overall Cognitive Status: Within Functional Limits for tasks assessed    Extremity/Trunk Assessment Upper Extremity Assessment Upper Extremity Assessment: Overall WFL for tasks assessed;Generalized weakness Cervical / Trunk Assessment  Cervical / Trunk Assessment: Normal     Mobility Bed Mobility Bed Mobility: Not assessed Details for Bed Mobility Assistance: pt sitting in chair and returned to recliner  Transfers Sit to Stand: 3: Mod assist;From chair/3-in-1;With armrests;With upper extremity assist;4: Min assist Stand to Sit: To chair/3-in-1;With armrests;With upper extremity assist;3: Mod assist;4: Min assist Details for Transfer Assistance: sit<>stand from recliner required mod (A) due to lower level surface; pt has tendency to lean anteriorly and requires max cues for upright position and safety; vc's for hand placement and safety; from 3 in 1 commode pt demo increased indepdence requiring min-mod (A) to achieve upright position, continues to require max cues for sequencing and hand placement         Balance Balance Balance Assessed: Yes Dynamic Standing Balance Dynamic Standing - Balance Support: Right upper extremity supported;Left upper extremity supported;During functional activity Dynamic Standing - Level of Assistance: 4: Min assist   End of Session OT - End of Session Equipment Utilized During Treatment: Gait belt;Rolling walker;Right knee immobilizer;Other (comment) (3 in 1 over toilet) Activity Tolerance: Patient tolerated treatment well Patient left: in chair;with call bell/phone within reach CPM Right Knee CPM Right Knee: Off  GO     Margaretmary Eddy [facility] 07/31/2012, 11:34 AM

## 2012-07-31 NOTE — Plan of Care (Signed)
Problem: Phase II Progression Outcomes Goal: Discharge plan established Pt plans to d/c to SNF for short term rehab after acute care d/c

## 2012-07-31 NOTE — Progress Notes (Signed)
Physical Therapy Treatment Patient Details Name: Susan [name] MRN: [id number] DOB: [birth year] Today's Date: 07/31/2012 Time: 2440-1027 PT Time Calculation (min): 40 min  PT Assessment / Plan / Recommendation  History of Present Illness s/p elective R TKA   Clinical Impression Pt slowly progressing with therapy. Able to amb to bathroom and back to recliner today with min(A) and decreased gt speed. Pt highly motivated and performed theraex. Pt requires increased time for mobility and toilet transfers. Plans for D/C tomorrow to [facility] for continued rehab.   PT Comments   Pt cont to benefit from skilled PT to increase independence.   Follow Up Recommendations  SNF;Supervision/Assistance - 24 hour     Does the patient have the potential to tolerate intense rehabilitation     Barriers to Discharge        Equipment Recommendations  Other (comment) (TBD at [facility] )    Recommendations for Other Services    Frequency 7X/week   Progress towards PT Goals Progress towards PT goals: Progressing toward goals  Plan Current plan remains appropriate    Precautions / Restrictions Precautions Precautions: Fall;Knee Restrictions Weight Bearing Restrictions: Yes RLE Weight Bearing: Weight bearing as tolerated   Pertinent Vitals/Pain 4-5/10    Mobility  Bed Mobility Bed Mobility: Not assessed Details for Bed Mobility Assistance: pt sitting in chair and returned to recliner  Transfers Transfers: Sit to Stand;Stand to Sit Sit to Stand: 3: Mod assist;From chair/3-in-1;With armrests;With upper extremity assist;4: Min assist Stand to Sit: To chair/3-in-1;With armrests;With upper extremity assist;3: Mod assist;4: Min assist Details for Transfer Assistance: sit<>stand from recliner required mod (A) due to lower level surface; pt has tendency to lean anteriorly and requires max cues for upright position and safety; vc's for hand placement and safety; from 3 in 1 commode pt demo increased indepdence  requiring min-mod (A) to achieve upright position, continues to require max cues for sequencing and hand placement  Ambulation/Gait Ambulation/Gait Assistance: 4: Min assist Ambulation Distance (Feet): 10 Feet (x2) Assistive device: Rolling walker Ambulation/Gait Assistance Details: max cues for gt sequencing and RW safety; (A) to advance and manage RW especially around turns and obstacles; pt requires facilitation at times to advance R LE; becomes fatigued quickly primarily in UEs Gait Pattern: Step-to pattern;Decreased stance time - right;Decreased step length - left;Trunk flexed;Narrow base of support;Antalgic;Decreased stride length Gait velocity: very decreased  Stairs: No Wheelchair Mobility Wheelchair Mobility: No    Exercises Total Joint Exercises Ankle Circles/Pumps: AROM;10 reps;Seated Quad Sets: AROM;Right;10 reps;Seated Heel Slides: AAROM;Right;10 reps;Seated Hip ABduction/ADduction: AAROM;Right;10 reps;Seated Long Arc Quad: AAROM;Right;10 reps;Seated Goniometric ROM: 0 to 70 degrees PROM in sitting   PT Diagnosis:    PT Problem List:   PT Treatment Interventions:     PT Goals (current goals can now be found in the care plan section) Acute Rehab PT Goals Patient Stated Goal: to go to [place] for rehab PT Goal Formulation: With patient Time For Goal Achievement: 08/06/12 Potential to Achieve Goals: Good  Visit Information  Last PT Received On: 07/31/12 Assistance Needed: +1 PT/OT Co-Evaluation/Treatment: Yes History of Present Illness: s/p elective R TKA    Subjective Data  Subjective: pt sitting in chair; agreeable to therapy  Patient Stated Goal: to go to [place] for rehab   Cognition  Cognition Arousal/Alertness: Awake/alert Behavior During Therapy: WFL for tasks assessed/performed Overall Cognitive Status: Within Functional Limits for tasks assessed    Balance  Balance Balance Assessed: No  End of Session PT - End of Session Equipment  Utilized During  Treatment: Gait belt;Right knee immobilizer Activity Tolerance: Patient tolerated treatment well Patient left: in chair;with call bell/phone within reach Nurse Communication: Mobility status   GP     Donell Sievert, [place] [phone] 07/31/2012, 10:30 AM

## 2012-07-31 NOTE — Discharge Summary (Signed)
Patient ID: JAXYN MESTAS MRN: [id number] DOB/AGE: 15-Apr-1940 72 y.o.  Admit date: 07/29/2012 Discharge date: 07/31/2012  Admission Diagnoses:  Principal Problem:   Osteoarthritis of right knee Active Problems:   Chronic cough   Hyperparathyroidism, primary   Sleep apnea   Discharge Diagnoses:  Same  Past Medical History  Diagnosis Date  . Hypertension   . Hyperlipidemia   . Depression   . Pedal edema     none in last 6 months  . Spinal stenosis   . Varicose veins     "BLE" (07/29/2012)  . Primary hyperparathyroidism   . Thyroid disease   . Incontinent of urine     wears pads  . GERD (gastroesophageal reflux disease)   . Complication of anesthesia     "I have apnea" (07/29/2012)  . Family history of anesthesia complication     "some wake up during OR; some are hard to wake up; some both" (07/29/2012)  . OSA on CPAP     "dx'd just last week; doesn't wear mask" (07/29/2012)  . Arthritis     "legs, back" (07/29/2012)  . Osteoarthritis of right knee   . Umbilical hernia     "unrepaired" (07/29/2012)  . Anxiety   . Chronic lower back pain   . History of stomach ulcers 1980's    Surgeries: Procedure(s): TOTAL KNEE ARTHROPLASTY on 07/29/2012   Consultants:    Discharged Condition: Improved  Hospital Course: Susan [name] is an 72 y.o. female who was admitted 07/29/2012 for operative treatment ofOsteoarthritis of right knee. Patient has severe unremitting pain that affects sleep, daily activities, and work/hobbies. After pre-op clearance the patient was taken to the operating room on 07/29/2012 and underwent  Procedure(s): TOTAL KNEE ARTHROPLASTY.    Patient was given perioperative antibiotics: Anti-infectives   Start     Dose/Rate Route Frequency Ordered Stop   07/29/12 0600  ceFAZolin (ANCEF) IVPB 2 g/50 mL premix     2 g 100 mL/hr over 30 Minutes Intravenous On call to O.R. 07/28/12 1411 07/29/12 1135       Patient was given sequential compression devices, early  ambulation, and chemoprophylaxis to prevent DVT.  Patient benefited maximally from hospital stay and there were no complications.    Recent vital signs: Patient Vitals for the past 24 hrs:  BP Temp Temp src Pulse Resp SpO2  07/31/12 0558 146/69 mmHg 99.7 F (37.6 C) - 82 18 100 %  07/31/12 0200 159/62 mmHg - - - - -  07/30/12 2118 171/73 mmHg 98.4 F (36.9 C) - 90 18 92 %  07/30/12 1600 - - - - 18 98 %  07/30/12 1250 146/66 mmHg 99.1 F (37.3 C) Oral - 16 98 %  07/30/12 1109 - - - - 18 93 %     Recent laboratory studies:  Recent Labs  07/30/12 0500 07/31/12 0640 07/31/12 0816  WBC 8.4  --  9.7  HGB 11.3*  --  10.5*  HCT 32.5*  --  30.3*  PLT 207  --  191  NA 135  --  139  K 4.1  --  4.0  CL 100  --  104  CO2 25  --  26  BUN 11  --  10  CREATININE 0.79  --  0.78  GLUCOSE 165*  --  111*  INR 1.09 1.27  --   CALCIUM 9.4  --  9.6     Discharge Medications:     Medication List    STOP  taking these medications       HYDROcodone-acetaminophen 5-325 MG per tablet  Commonly known as:  NORCO/VICODIN     ibuprofen 200 MG tablet  Commonly known as:  ADVIL,MOTRIN      TAKE these medications       ALPRAZolam 0.25 MG tablet  Commonly known as:  XANAX  Take 0.25 mg by mouth at bedtime as needed for sleep.     buPROPion 100 MG tablet  Commonly known as:  WELLBUTRIN  Take 100 mg by mouth 2 (two) times daily.     calcium-vitamin D 500-200 MG-UNIT per tablet  Commonly known as:  OSCAL WITH D  Take 1 tablet by mouth daily.     DSS 100 MG Caps  Take 100 mg by mouth 2 (two) times daily.     enoxaparin 30 MG/0.3ML injection  Commonly known as:  LOVENOX  1 injections sub q  Every 12 hours until INR is 2.0 or higher     multivitamin with minerals Tabs  Take 1 tablet by mouth daily.     omeprazole 40 MG capsule  Commonly known as:  PRILOSEC  Take 40 mg by mouth daily.     OVER THE COUNTER MEDICATION  Take 1 tablet by mouth 3 (three) times daily. otc eye vitamin      oxyCODONE 5 MG immediate release tablet  Commonly known as:  Oxy IR/ROXICODONE  1-2 tablets every 4-6 hrs as needed for pain     sertraline 100 MG tablet  Commonly known as:  ZOLOFT  Take 100 mg by mouth every morning.     valsartan-hydrochlorothiazide 160-12.5 MG per tablet  Commonly known as:  DIOVAN-HCT  Take 1 tablet by mouth every morning.     vitamin B-12 100 MCG tablet  Commonly known as:  CYANOCOBALAMIN  Take 100 mcg by mouth daily.     Vitamin D (Ergocalciferol) 50000 UNITS Caps  Commonly known as:  DRISDOL  Take 50,000 Units by mouth every 7 (seven) days.     warfarin 5 MG tablet  Commonly known as:  COUMADIN  Take as directed to maintain INR between 2.0 and 3.0        Diagnostic Studies: Dg Knee 1-2 Views Right  07/29/2012   *RADIOLOGY REPORT*  Clinical Data: Postop right total knee replacement  RIGHT KNEE - 1-2 VIEW  Comparison: Portable exam 1426 hours compared to 04/24/2011  Findings: Components of right knee prosthesis in expected positions. Anterior skin clips and surgical drain. No acute fracture, dislocation or bone destruction. Expected postsurgical soft tissue changes.  IMPRESSION: Right knee prosthesis without acute complication.   Original Report Authenticated By: Ulyses Southward, M.D.    Disposition: 01-Home or Self Care      Discharge Orders   Future Appointments Provider Department Dept Phone   08/18/2012 3:50 PM [name], MD [facility], [place] [phone]   Future Orders Complete By Expires     CPM  As directed     Comments:      Continuous passive motion machine (CPM):      Use the CPM from 0 to 90 for 6 hours per day.       You may break it up into 2 or 3 sessions per day.      Use CPM for 2 weeks or until you are told to stop.    Call MD / Call 911  As directed     Comments:      If you experience  chest pain or shortness of breath, CALL 911 and be transported to the hospital emergency room.  If you develope a fever above 101  F, pus (white drainage) or increased drainage or redness at the wound, or calf pain, call your surgeon's office.    Change dressing  As directed     Comments:      Change the dressing daily with sterile 4 x 4 inch gauze dressing and apply TED hose.  You may clean the incision with alcohol prior to redressing.    Constipation Prevention  As directed     Comments:      Drink plenty of fluids.  Prune juice may be helpful.  You may use a stool softener, such as Colace (over the counter) 100 mg twice a day.  Use MiraLax (over the counter) for constipation as needed.    Diet - low sodium heart healthy  As directed     Discharge instructions  As directed     Comments:      Total Knee Replacement Care After Refer to this sheet in the next few weeks. These discharge instructions provide you with general information on caring for yourself after you leave the hospital. Your caregiver may also give you specific instructions. Your treatment has been planned according to the most current medical practices available, but unavoidable complications sometimes occur. If you have any problems or questions after discharge, please call your caregiver. Regaining a near full range of motion of your knee within the first 3 to 6 weeks after surgery is critical. HOME CARE INSTRUCTIONS  You may resume a normal diet and activities as directed.  Perform exercises as directed.  Place yellow foam block, yellow side up under heel at all times except when in CPM or when walking.  DO NOT modify, tear, cut, or change in any way. You will receive physical therapy daily  Take showers instead of baths until informed otherwise.  Change bandages (dressings)daily Do not take over-the-counter or prescription medicines for pain, discomfort, or fever. Eat a well-balanced diet.  Avoid lifting or driving until you are instructed otherwise.  Make an appointment to see your caregiver for stitches (suture) or staple removal as directed.  If  you have been sent home with a continuous passive motion machine (CPM machine), 0-90 degrees 6 hrs a day   2 hrs a shift SEEK MEDICAL CARE IF: You have swelling of your calf or leg.  You develop shortness of breath or chest pain.  You have redness, swelling, or increasing pain in the wound.  There is pus or any unusual drainage coming from the surgical site.  You notice a bad smell coming from the surgical site or dressing.  The surgical site breaks open after sutures or staples have been removed.  There is persistent bleeding from the suture or staple line.  You are getting worse or are not improving.  You have any other questions or concerns.  SEEK IMMEDIATE MEDICAL CARE IF:  You have a fever.  You develop a rash.  You have difficulty breathing.  You develop any reaction or side effects to medicines given.  Your knee motion is decreasing rather than improving.  MAKE SURE YOU:  Understand these instructions.  Will watch your condition.  Will get help right away if you are not doing well or get worse.    Do not put a pillow under the knee. Place it under the heel.  As directed     Increase activity slowly  as tolerated  As directed     TED hose  As directed     Comments:      Use stockings (TED hose) for 2 weeks on both leg(s).  You may remove them at night for sleeping.       Follow-up Information   Follow up with Loreta Ave, MD On 08/11/2012. (appt 2:45 pm)    Contact information:   [address] [place] [address] [phone]        Signed: Pascal Lux 07/31/2012, 9:17 AM

## 2012-07-31 NOTE — Progress Notes (Signed)
Subjective: 2 Days Post-Op Procedure(s) (LRB): TOTAL KNEE ARTHROPLASTY (Right) Patient reports pain as 2 on 0-10 scale and 4 on 0-10 scale.    Objective: Vital signs in last 24 hours: Temp:  [98.4 F (36.9 C)-99.7 F (37.6 C)] 99.7 F (37.6 C) (07/25 0558) Pulse Rate:  [82-90] 82 (07/25 0558) Resp:  [16-18] 18 (07/25 0558) BP: (146-171)/(62-73) 146/69 mmHg (07/25 0558) SpO2:  [92 %-100 %] 100 % (07/25 0558)  Intake/Output from previous day: 07/24 0701 - 07/25 0700 In: 2246.7 [I.V.:2246.7] Out: 120 [Urine:120] Intake/Output this shift:     Recent Labs  07/29/12 1654 07/30/12 0500  HGB 11.4* 11.3*    Recent Labs  07/29/12 1654 07/30/12 0500  WBC 12.0* 8.4  RBC 3.77* 3.61*  HCT 34.1* 32.5*  PLT 197 207    Recent Labs  07/29/12 1654 07/30/12 0500  NA  --  135  K  --  4.1  CL  --  100  CO2  --  25  BUN  --  11  CREATININE 0.83 0.79  GLUCOSE  --  165*  CALCIUM  --  9.4    Recent Labs  07/30/12 0500 07/31/12 0640  INR 1.09 1.27    Neurologically intact ABD soft Neurovascular intact Sensation intact distally Intact pulses distally Dorsiflexion/Plantar flexion intact Incision: scant drainage  Assessment/Plan: 2 Days Post-Op Procedure(s) (LRB): TOTAL KNEE ARTHROPLASTY (Right) Advance diet Up with therapy D/C IV fluids Plan for discharge tomorrow Discharge to SNF  [facility] J 07/31/2012, 8:52 AM

## 2012-08-01 NOTE — Care Management Note (Signed)
    Page 1 of 1   08/01/2012     6:04:32 AM   CARE MANAGEMENT NOTE 08/01/2012  Patient:  Susan [name], Susan [name]   Account Number:  [id number]  Date Initiated:  07/30/2012  Documentation initiated by:  [facility]  Subjective/Objective Assessment:   admitted postop rt total knee arthroplasty     Action/Plan:   PT/OT evals-recommended SNF   Anticipated DC Date:  07/31/2012   Anticipated DC Plan:  SKILLED NURSING FACILITY  In-house referral  Clinical Social Worker      DC Planning Services  CM consult      Choice offered to / List presented to:             Status of service:  Completed, signed off Medicare Important Message given?   (If response is "NO", the following Medicare IM given date fields will be blank) Date Medicare IM given:   Date Additional Medicare IM given:    Discharge Disposition:  SKILLED NURSING FACILITY  Per UR Regulation:  Reviewed for med. necessity/level of care/duration of stay  If discussed at Long Length of Stay Meetings, dates discussed:    Comments:

## 2012-08-06 ENCOUNTER — Non-Acute Institutional Stay (SKILLED_NURSING_FACILITY): Payer: Medicare Other | Admitting: Internal Medicine

## 2012-08-06 ENCOUNTER — Encounter: Payer: Self-pay | Admitting: Internal Medicine

## 2012-08-06 DIAGNOSIS — I119 Hypertensive heart disease without heart failure: Secondary | ICD-10-CM | POA: Insufficient documentation

## 2012-08-06 DIAGNOSIS — K219 Gastro-esophageal reflux disease without esophagitis: Secondary | ICD-10-CM

## 2012-08-06 DIAGNOSIS — I10 Essential (primary) hypertension: Secondary | ICD-10-CM

## 2012-08-06 DIAGNOSIS — F32A Depression, unspecified: Secondary | ICD-10-CM

## 2012-08-06 DIAGNOSIS — F329 Major depressive disorder, single episode, unspecified: Secondary | ICD-10-CM | POA: Insufficient documentation

## 2012-08-06 DIAGNOSIS — Z9889 Other specified postprocedural states: Secondary | ICD-10-CM

## 2012-08-06 DIAGNOSIS — F411 Generalized anxiety disorder: Secondary | ICD-10-CM

## 2012-08-06 DIAGNOSIS — F419 Anxiety disorder, unspecified: Secondary | ICD-10-CM | POA: Insufficient documentation

## 2012-08-06 NOTE — Assessment & Plan Note (Signed)
Stable -pt on wellbutrin

## 2012-08-06 NOTE — Assessment & Plan Note (Signed)
Doing well;here for PT; pain is controlled with pain meds

## 2012-08-06 NOTE — Assessment & Plan Note (Signed)
Stable on medication

## 2012-08-06 NOTE — Assessment & Plan Note (Signed)
Stable-on xanax prn

## 2012-08-06 NOTE — Progress Notes (Signed)
MRN: [id number] Name: Susan [name]  Sex: female Age: 72 y.o. DOB: [birth year]  PSC #: Sheliah Hatch Facility/Room:706 Level Of Care: SNF Provider: Merrilee Seashore D Emergency Contacts: Extended Emergency Contact Information Primary Emergency Contact: [address], [place] [address] [place] Home Phone: [phone] Relation: Daughter Secondary Emergency Contact: Henrie,[name]          [place], [place] [address] [place] Home Phone: [phone] Relation: Daughter  Code Status: FULL  Allergies: Ace inhibitors; Lipitor; and Simvastatin  Chief Complaint  Patient presents with  . nursing home admission    HPI: Patient is 72 y.o. female who had total arthroscopy of R knee and is here for PT.  Past Medical History  Diagnosis Date  . Hyperlipidemia   . Pedal edema     none in last 6 months  . Spinal stenosis   . Varicose veins     "BLE" (07/29/2012)  . Primary hyperparathyroidism   . Thyroid disease   . Incontinent of urine     wears pads  . Complication of anesthesia     "I have apnea" (07/29/2012)  . Family history of anesthesia complication     "some wake up during OR; some are hard to wake up; some both" (07/29/2012)  . OSA on CPAP     "dx'd just last week; doesn't wear mask" (07/29/2012)  . Arthritis     "legs, back" (07/29/2012)  . Osteoarthritis of right knee   . Umbilical hernia     "unrepaired" (07/29/2012)  . Chronic lower back pain   . History of stomach ulcers 1980's  . Anxiety   . Depression   . GERD (gastroesophageal reflux disease)   . Hypertension     Past Surgical History  Procedure Laterality Date  . Iud removal  1980's  . Parathyroidectomy N/A 06/16/2012    Procedure: NECK EXPLORATION AND LEFT SUPERIOR PARATHYROIDECTOMY;  Surgeon: [name], MD;  Location: [facility] ORS;  Service: General;  Laterality: N/A;  . Colonoscopy    . 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 Cholecystectomy  ~ 2010  . Total knee arthroplasty  Right 07/29/2012    Procedure: TOTAL KNEE ARTHROPLASTY;  Surgeon: Loreta Ave, MD;  Location: [facility] OR;  Service: Orthopedics;  Laterality: Right;      Medication List       This list is accurate as of: 08/06/12  5:35 PM.  Always use your most recent med list.               ALPRAZolam 0.25 MG tablet  Commonly known as:  XANAX  Take 0.25 mg by mouth at bedtime as needed for sleep.     buPROPion 100 MG tablet  Commonly known as:  WELLBUTRIN  Take 100 mg by mouth 2 (two) times daily.     calcium-vitamin D 500-200 MG-UNIT per tablet  Commonly known as:  OSCAL WITH D  Take 1 tablet by mouth daily.     DSS 100 MG Caps  Take 100 mg by mouth 2 (two) times daily.     enoxaparin 30 MG/0.3ML injection  Commonly known as:  LOVENOX  1 injections sub q  Every 12 hours until INR is 2.0 or higher     multivitamin with minerals Tabs  Take 1 tablet by mouth daily.     omeprazole 40 MG capsule  Commonly known as:  PRILOSEC  Take 40 mg by mouth daily.  OVER THE COUNTER MEDICATION  Take 1 tablet by mouth 3 (three) times daily. otc eye vitamin     oxyCODONE 5 MG immediate release tablet  Commonly known as:  Oxy IR/ROXICODONE  1-2 tablets every 4 hrs as needed for pain     sertraline 100 MG tablet  Commonly known as:  ZOLOFT  Take 100 mg by mouth every morning.     valsartan-hydrochlorothiazide 160-12.5 MG per tablet  Commonly known as:  DIOVAN-HCT  Take 1 tablet by mouth every morning.     vitamin B-12 100 MCG tablet  Commonly known as:  CYANOCOBALAMIN  Take 100 mcg by mouth daily.     Vitamin D (Ergocalciferol) 50000 UNITS Caps  Commonly known as:  DRISDOL  Take 50,000 Units by mouth every 7 (seven) days.     warfarin 5 MG tablet  Commonly known as:  COUMADIN  Take as directed to maintain INR between 2.0 and 3.0        No orders of the defined types were placed in this encounter.    Immunization History  Administered Date(s) Administered  . Influenza Whole  10/02/2010  . Pneumococcal-Generic 01/08/2012    History  Substance Use Topics  . Smoking status: Former Smoker -- 1.00 packs/day for 30 years    Types: Cigarettes    Quit date: 01/08/1988  . Smokeless tobacco: Never Used  . Alcohol Use: Yes     Comment: 07/29/2012 "socially every 2-3 months"    Family history is noncontributory    Review of Systems  DATA OBTAINED: from patient, GENERAL: Feels well no fevers, fatigue, appetite changes SKIN: No itching, rash or wounds EYES: No eye pain, redness, discharge EARS: No earache, tinnitus, change in hearing NOSE: No congestion, drainage or bleeding  MOUTH/THROAT: No mouth or tooth pain, No sore throat, No difficulty chewing or swallowing  RESPIRATORY: No cough, wheezing, SOB CARDIAC: No chest pain, palpitations, lower extremity edema  GI: No abdominal pain, No N/V/D or constipation, No heartburn or reflux  GU: No dysuria, frequency or urgency, or incontinence  MUSCULOSKELETAL: pain in knee a little worse than ecpected NEUROLOGIC: Awake, alert, appropriate to situation, No change in mental status. Moves all four, no focal deficits PSYCHIATRIC: No overt anxiety or sadness. Sleeps well. No behavior issue.  AMBULATION:    Filed Vitals:   08/06/12 1725  BP: 140/77  Pulse: 78  Temp: 98.2 F (36.8 C)  Resp: 20    Physical Exam  GENERAL APPEARANCE: Alert, conversant. Appropriately groomed. No acute distress.  SKIN: No diaphoresis rash, or wounds HEAD: Normocephalic, atraumatic  EYES: Conjunctiva/lids clear. Pupils round, reactive. EOMs intact.  EARS: External exam WNL, canals clear. Hearing grossly normal.  NOSE: No deformity or discharge.  MOUTH/THROAT: Lips w/o lesions.  RESPIRATORY: Breathing is even, unlabored. Lung sounds are clear   CARDIOVASCULAR: Heart RRR no murmurs, rubs or gallops. Mild edema R below knee related to surgery ARTERIAL: radial pulse 2+, DP pulse 1+  VENOUS: No varicosities. No venous stasis skin changes   GASTROINTESTINAL: Abdomen is soft, non-tender, not distended w/ normal bowel sounds.  MUSCULOSKELETAL: Mild swelling , mild heat but no redness to R knee;op dressing in place NEUROLOGIC: Oriented X3. Cranial nerves 2-12 grossly intact. Moves all extremities no tremor. PSYCHIATRIC: Mood and affect appropriate to situation, no behavioral issues  Patient Active Problem List   Diagnosis Date Noted  . H/O arthroscopy of right knee 08/06/2012  . Anxiety   . Depression   . GERD (gastroesophageal reflux disease)   .  Hypertension   . Sleep apnea   . Osteoarthritis of right knee   . Hyperparathyroidism, primary 04/22/2012  . Chronic cough 10/02/2011     CBC    Component Value Date/Time   WBC 9.7 07/31/2012 0816   RBC 3.37* 07/31/2012 0816   HGB 10.5* 07/31/2012 0816   HCT 30.3* 07/31/2012 0816   PLT 191 07/31/2012 0816   MCV 89.9 07/31/2012 0816   LYMPHSABS 1.3 04/24/2011 0249   MONOABS 0.7 04/24/2011 0249   EOSABS 0.2 04/24/2011 0249   BASOSABS 0.0 04/24/2011 0249    CMP     Component Value Date/Time   NA 139 07/31/2012 0816   K 4.0 07/31/2012 0816   CL 104 07/31/2012 0816   CO2 26 07/31/2012 0816   GLUCOSE 111* 07/31/2012 0816   BUN 10 07/31/2012 0816   CREATININE 0.78 07/31/2012 0816   CALCIUM 9.6 07/31/2012 0816   PROT 7.1 07/23/2012 1607   ALBUMIN 3.9 07/23/2012 1607   AST 22 07/23/2012 1607   ALT 17 07/23/2012 1607   ALKPHOS 85 07/23/2012 1607   BILITOT 0.3 07/23/2012 1607   GFRNONAA 80* 07/31/2012 0816   GFRAA >90 07/31/2012 0816    Assessment and Plan  No problem-specific assessment & plan notes found for this encounter.   Margit Hanks, MD

## 2012-08-06 NOTE — Assessment & Plan Note (Signed)
Diagnosed in past but does not use a mask

## 2012-08-06 NOTE — Assessment & Plan Note (Signed)
No c/o;pt is on omeprazole

## 2012-08-12 ENCOUNTER — Non-Acute Institutional Stay (SKILLED_NURSING_FACILITY): Payer: Medicare Other | Admitting: Adult Health

## 2012-08-12 DIAGNOSIS — K219 Gastro-esophageal reflux disease without esophagitis: Secondary | ICD-10-CM

## 2012-08-12 DIAGNOSIS — M1710 Unilateral primary osteoarthritis, unspecified knee: Secondary | ICD-10-CM

## 2012-08-12 DIAGNOSIS — I10 Essential (primary) hypertension: Secondary | ICD-10-CM

## 2012-08-12 DIAGNOSIS — K5900 Constipation, unspecified: Secondary | ICD-10-CM

## 2012-08-12 DIAGNOSIS — M1711 Unilateral primary osteoarthritis, right knee: Secondary | ICD-10-CM

## 2012-08-12 DIAGNOSIS — F329 Major depressive disorder, single episode, unspecified: Secondary | ICD-10-CM

## 2012-08-18 ENCOUNTER — Other Ambulatory Visit (INDEPENDENT_AMBULATORY_CARE_PROVIDER_SITE_OTHER): Payer: Self-pay | Admitting: Surgery

## 2012-08-18 ENCOUNTER — Ambulatory Visit (INDEPENDENT_AMBULATORY_CARE_PROVIDER_SITE_OTHER): Payer: Medicare Other | Admitting: Surgery

## 2012-08-18 ENCOUNTER — Encounter (INDEPENDENT_AMBULATORY_CARE_PROVIDER_SITE_OTHER): Payer: Self-pay | Admitting: Surgery

## 2012-08-18 VITALS — BP 140/76 | HR 76 | Resp 16 | Ht 66.0 in | Wt 199.2 lb

## 2012-08-18 DIAGNOSIS — E210 Primary hyperparathyroidism: Secondary | ICD-10-CM

## 2012-08-18 NOTE — Progress Notes (Signed)
General Surgery [facility]  Visit Diagnoses: 1. Hyperparathyroidism, primary     HISTORY: Patient returns for final wound check having undergone parathyroidectomy in June 2014. Calcium levels had normalized and her most recent levels are 9.4 and 9.6. She has not had a intact PTH level postoperatively despite having one ordered during her last office visit. We will obtain that in the near future.  EXAM: Surgical incision is well-healed. No soft tissue swelling. No seroma. No sign of infection. Voice quality is normal.  IMPRESSION: Status post parathyroidectomy with normalization of serum calcium level  PLAN: Patient will have an intact PTH level drawn in the near future. She will continue to apply topical creams to her incision.  Patient will return for surgical care as needed.  [name], MD, FACS General & Endocrine Surgery [facility]

## 2012-08-18 NOTE — Patient Instructions (Signed)
  COCOA BUTTER & VITAMIN E CREAM  (Palmer's or other brand)  Apply cocoa butter/vitamin E cream to your incision 2 - 3 times daily.  Massage cream into incision for one minute with each application.  Use sunscreen (50 SPF or higher) for first 6 months after surgery if area is exposed to sun.  You may substitute Mederma or other scar reducing creams as desired.   

## 2012-08-19 LAB — PTH, INTACT AND CALCIUM
Calcium: 10.2 mg/dL (ref 8.4–10.5)
PTH: 104.5 pg/mL — ABNORMAL HIGH (ref 14.0–72.0)

## 2012-08-28 ENCOUNTER — Other Ambulatory Visit (INDEPENDENT_AMBULATORY_CARE_PROVIDER_SITE_OTHER): Payer: Self-pay

## 2012-08-28 ENCOUNTER — Telehealth (INDEPENDENT_AMBULATORY_CARE_PROVIDER_SITE_OTHER): Payer: Self-pay

## 2012-08-28 DIAGNOSIS — E213 Hyperparathyroidism, unspecified: Secondary | ICD-10-CM

## 2012-08-28 NOTE — Telephone Encounter (Signed)
I have left msg for pt to call back. Per attached note from Dr Gerrit Friends. Lab slip for labs at [facility] mailed to pt for labs to be done Dec. 2014.

## 2012-08-28 NOTE — Telephone Encounter (Signed)
Patient called back and was given below message.

## 2012-08-28 NOTE — Telephone Encounter (Signed)
Message copied by Joanette Gula on Fri Aug 28, 2012  2:50 PM ------      Message from: [name]      Created: Mon Aug 24, 2012  9:56 AM       Unfortunately, the calcium level has actually RISEN since her last two measurements!  PTH is still elevated.  Raises the possibility of a second gland adenoma.            Will request repeat labs in 4 months at [facility] instead of [facility] for comparison Arline Asp please arrange for me).            [name], MD, [facility]      Office: [phone]             ------

## 2012-09-02 DIAGNOSIS — K5900 Constipation, unspecified: Secondary | ICD-10-CM | POA: Insufficient documentation

## 2012-09-02 NOTE — Progress Notes (Signed)
Patient ID: Susan [name], female   DOB: 08-19-1940, 72 y.o.   MRN: [id number]        PROGRESS NOTE  DATE: 08/12/2012   FACILITY: [facility]  LEVEL OF CARE: SNF (31)  CHIEF COMPLAINT:  Discharge Visit  HISTORY OF PRESENT ILLNESS:  This is a 72-year-olf female who is for discharge home with home health PT, OT, CNA, nursing and social worker.  DME: Rolling walker due to unsteady gait . She has been admitted to [facility] on 07/31/12 from [facility] with osteoarthritis status post right total knee arthroplasty. She has been admitted to this facility for short-term rehabilitation after the patient's recent hospitalization.  Patient has completed SNF rehabilitation and therapy has cleared the patient for discharge.  Reassessment of ongoing problem(s):  HTN: Pt 's HTN remains stable.  Denies CP, sob, DOE, pedal edema, headaches, dizziness or visual disturbances.  No complications from the medications currently being used.  Last BP : 128/79  DEPRESSION: The depression remains stable. Patient denies ongoing feelings of sadness, insomnia, anedhonia or lack of appetite. No complications reported from the medications currently being used. Staff do not report behavioral problems.  GERD: pt's GERD is stable.  Denies ongoing heartburn, abd. Pain, nausea or vomiting.  Currently on a PPI & tolerates it without any adverse reactions.   PAST MEDICAL HISTORY : Reviewed.  No changes.  CURRENT MEDICATIONS: Reviewed per [facility]  REVIEW OF SYSTEMS:  GENERAL: no change in appetite, no fatigue, no weight changes, no fever, chills or weakness RESPIRATORY: no cough, SOB, DOE, wheezing, hemoptysis CARDIAC: no chest pain, edema or palpitations GI: no abdominal pain, diarrhea, constipation, heart burn, nausea or vomiting  PHYSICAL EXAMINATION  VS:  T  97.4     P71       RR20      BP128/79            WT203.6 (Lb)  GENERAL: no acute distress, normal body habitus EYES: conjunctivae  normal, sclerae normal, normal eye lids NECK: supple, trachea midline, no neck masses, no thyroid tenderness, no thyromegaly LYMPHATICS: no LAN in the neck, no supraclavicular LAN RESPIRATORY: breathing is even & unlabored, BS CTAB CARDIAC: RRR, no murmur,no extra heart sounds, no edema GI: abdomen soft, normal BS, no masses, no tenderness, no hepatomegaly, no splenomegaly PSYCHIATRIC: the patient is alert & oriented to person, affect & behavior appropriate  LABS/RADIOLOGY: 07/31/12 WBC 9.7 hemoglobin 10.5 hematocrit 30.3 sodium 139 potassium 4.0 BUN 10 creatinine 0.78 glucose 111 calcium 9.6  ASSESSMENT/PLAN:  Osteoarthritis status post right total knee arthroplasty - for home health PT, OT, C. and A., nursing and social worker  Hypertension - well controlled  GERD - stable  Depression - stable  Constipation - no complaints  I have filled out patient's discharge paperwork and written prescriptions.  Patient will receive home health PT, OT, ST, Nursing, Social worker and CNA.  DME provided: Rolling walker  Total discharge time: Greater than 30 minutes Discharge time involved coordination of the discharge process with Child psychotherapist, nursing staff and therapy department. Medical justification for home health services/DME verified.  CPT CODE: 40981

## 2012-09-15 ENCOUNTER — Ambulatory Visit: Payer: Medicare Other | Attending: Orthopedic Surgery | Admitting: Physical Therapy

## 2012-09-15 DIAGNOSIS — R609 Edema, unspecified: Secondary | ICD-10-CM | POA: Insufficient documentation

## 2012-09-15 DIAGNOSIS — IMO0001 Reserved for inherently not codable concepts without codable children: Secondary | ICD-10-CM | POA: Insufficient documentation

## 2012-09-15 DIAGNOSIS — Z96659 Presence of unspecified artificial knee joint: Secondary | ICD-10-CM | POA: Insufficient documentation

## 2012-09-15 DIAGNOSIS — M25669 Stiffness of unspecified knee, not elsewhere classified: Secondary | ICD-10-CM | POA: Insufficient documentation

## 2012-09-15 DIAGNOSIS — M25569 Pain in unspecified knee: Secondary | ICD-10-CM | POA: Insufficient documentation

## 2012-09-17 ENCOUNTER — Ambulatory Visit: Payer: Medicare Other | Admitting: Physical Therapy

## 2012-09-29 ENCOUNTER — Ambulatory Visit: Payer: Medicare Other | Admitting: Physical Therapy

## 2012-09-29 ENCOUNTER — Other Ambulatory Visit: Payer: Self-pay | Admitting: Adult Health

## 2012-09-29 ENCOUNTER — Encounter (HOSPITAL_BASED_OUTPATIENT_CLINIC_OR_DEPARTMENT_OTHER): Payer: Self-pay | Admitting: Emergency Medicine

## 2012-09-29 ENCOUNTER — Emergency Department (HOSPITAL_BASED_OUTPATIENT_CLINIC_OR_DEPARTMENT_OTHER): Payer: Medicare Other

## 2012-09-29 ENCOUNTER — Emergency Department (HOSPITAL_BASED_OUTPATIENT_CLINIC_OR_DEPARTMENT_OTHER)
Admission: EM | Admit: 2012-09-29 | Discharge: 2012-09-29 | Disposition: A | Discharge status: Home / Self Care | Payer: Medicare Other | Attending: Emergency Medicine | Admitting: Emergency Medicine

## 2012-09-29 DIAGNOSIS — M129 Arthropathy, unspecified: Secondary | ICD-10-CM | POA: Insufficient documentation

## 2012-09-29 DIAGNOSIS — IMO0002 Reserved for concepts with insufficient information to code with codable children: Secondary | ICD-10-CM | POA: Insufficient documentation

## 2012-09-29 DIAGNOSIS — G8929 Other chronic pain: Secondary | ICD-10-CM | POA: Insufficient documentation

## 2012-09-29 DIAGNOSIS — Z8711 Personal history of peptic ulcer disease: Secondary | ICD-10-CM | POA: Insufficient documentation

## 2012-09-29 DIAGNOSIS — G4733 Obstructive sleep apnea (adult) (pediatric): Secondary | ICD-10-CM | POA: Insufficient documentation

## 2012-09-29 DIAGNOSIS — K219 Gastro-esophageal reflux disease without esophagitis: Secondary | ICD-10-CM | POA: Insufficient documentation

## 2012-09-29 DIAGNOSIS — Z8639 Personal history of other endocrine, nutritional and metabolic disease: Secondary | ICD-10-CM | POA: Insufficient documentation

## 2012-09-29 DIAGNOSIS — F411 Generalized anxiety disorder: Secondary | ICD-10-CM | POA: Insufficient documentation

## 2012-09-29 DIAGNOSIS — M1710 Unilateral primary osteoarthritis, unspecified knee: Secondary | ICD-10-CM | POA: Insufficient documentation

## 2012-09-29 DIAGNOSIS — Z96659 Presence of unspecified artificial knee joint: Secondary | ICD-10-CM | POA: Insufficient documentation

## 2012-09-29 DIAGNOSIS — F3289 Other specified depressive episodes: Secondary | ICD-10-CM | POA: Insufficient documentation

## 2012-09-29 DIAGNOSIS — Z862 Personal history of diseases of the blood and blood-forming organs and certain disorders involving the immune mechanism: Secondary | ICD-10-CM | POA: Insufficient documentation

## 2012-09-29 DIAGNOSIS — L02419 Cutaneous abscess of limb, unspecified: Secondary | ICD-10-CM | POA: Insufficient documentation

## 2012-09-29 DIAGNOSIS — Z79899 Other long term (current) drug therapy: Secondary | ICD-10-CM | POA: Insufficient documentation

## 2012-09-29 DIAGNOSIS — M7989 Other specified soft tissue disorders: Secondary | ICD-10-CM

## 2012-09-29 DIAGNOSIS — Z7901 Long term (current) use of anticoagulants: Secondary | ICD-10-CM | POA: Insufficient documentation

## 2012-09-29 DIAGNOSIS — L03115 Cellulitis of right lower limb: Secondary | ICD-10-CM

## 2012-09-29 DIAGNOSIS — F329 Major depressive disorder, single episode, unspecified: Secondary | ICD-10-CM | POA: Insufficient documentation

## 2012-09-29 DIAGNOSIS — I10 Essential (primary) hypertension: Secondary | ICD-10-CM | POA: Insufficient documentation

## 2012-09-29 DIAGNOSIS — Z87891 Personal history of nicotine dependence: Secondary | ICD-10-CM | POA: Insufficient documentation

## 2012-09-29 MED ORDER — CEPHALEXIN 500 MG PO CAPS: 500.0000 mg | ORAL_CAPSULE | Freq: Four times a day (QID) | ORAL | Status: DC

## 2012-09-29 NOTE — ED Notes (Signed)
Pt sent from [facility] for eval for possible DVT on Right.  Pt had Right TKR July 23rd.  Increased swelling and pain with warmth and tenderness since yesterday. Positive Homans per Stacie Glaze, PT.  Sent to ED for eval.

## 2012-09-29 NOTE — ED Provider Notes (Signed)
CSN: [id number]     Arrival date & time 09/29/12  1739 History  This chart was scribed for Audree Camel, MD by Valera Castle, ED Scribe. This patient was seen in room MH12/MH12 and the patient's care was started at 6:41 PM.    Chief Complaint  Patient presents with  . Leg Pain    Patient is a 72 y.o. female presenting with leg pain. The history is provided by the patient. No language interpreter was used.  Leg Pain Location:  Leg Time since incident:  1 day Leg location:  R leg Pain details:    Severity:  Moderate   Onset quality:  Gradual   Duration:  2 months   Timing:  Constant   Progression:  Worsening Associated symptoms: no fever    HPI Comments: NATEISHA MOYD is a 72 y.o. female who presents to the Emergency Department complaining of gradual, moderate, constant right leg pain, with associated swelling, redness, and warmth, onset yesterday. She reports a pain severity of 3/10 currently, but that the pain increases with touch. She reports having a total knee placement July 23rd, and states the the swelling won't reside and that the pain has increased since her knee replacement, and that the symptoms worsened yesterday. She states associated pain in her right foot. She denies any chest pain, SOB, and any other associated symptoms. She has an allergy to ace inhibitors, lipitor, and simvastatin.   Ortho - Dr. Eulah Pont  Past Medical History  Diagnosis Date  . Hyperlipidemia   . Pedal edema     none in last 6 months  . Spinal stenosis   . Varicose veins     "BLE" (07/29/2012)  . Primary hyperparathyroidism   . Thyroid disease   . Incontinent of urine     wears pads  . Complication of anesthesia     "I have apnea" (07/29/2012)  . Family history of anesthesia complication     "some wake up during OR; some are hard to wake up; some both" (07/29/2012)  . OSA on CPAP     "dx'd just last week; doesn't wear mask" (07/29/2012)  . Arthritis     "legs, back" (07/29/2012)  .  Osteoarthritis of right knee   . Umbilical hernia     "unrepaired" (07/29/2012)  . Chronic lower back pain   . History of stomach ulcers 1980's  . Anxiety   . Depression   . GERD (gastroesophageal reflux disease)   . Hypertension    Past Surgical History  Procedure Laterality Date  . Iud removal  1980's  . Parathyroidectomy N/A 06/16/2012    Procedure: NECK EXPLORATION AND LEFT SUPERIOR PARATHYROIDECTOMY;  Surgeon: [name], MD;  Location: [facility] ORS;  Service: General;  Laterality: N/A;  . Colonoscopy    . 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 Cholecystectomy  ~ 2010  . Total knee arthroplasty Right 07/29/2012    Procedure: TOTAL KNEE ARTHROPLASTY;  Surgeon: Loreta Ave, MD;  Location: [facility] OR;  Service: Orthopedics;  Laterality: Right;   Family History  Problem Relation Age of Onset  . Hypertension Mother     deceased  . Lung cancer Father     deceased   History  Substance Use Topics  . Smoking status: Former Smoker -- 1.00 packs/day for 30 years    Types: Cigarettes    Quit date: 01/08/1988  . Smokeless tobacco: Never Used  . Alcohol Use: Yes     Comment: 07/29/2012 "socially every 2-3  months"   OB History   Grav Para Term Preterm Abortions TAB SAB Ect Mult Living                 Review of Systems  Constitutional: Negative for fever and chills.  Respiratory: Negative for shortness of breath.   Cardiovascular: Positive for leg swelling. Negative for chest pain.  Musculoskeletal:       Right leg pain and swelling.   Skin: Positive for color change.  All other systems reviewed and are negative.    Allergies  Ace inhibitors; Lipitor; and Simvastatin  Home Medications   Current Outpatient Rx  Name  Route  Sig  Dispense  Refill  . ALPRAZolam (XANAX) 0.25 MG tablet   Oral   Take 0.25 mg by mouth at bedtime as needed for sleep.          Marland Kitchen buPROPion (WELLBUTRIN) 100 MG tablet   Oral   Take 100 mg by mouth 2 (two) times daily.         .  calcium-vitamin D (OSCAL WITH D) 500-200 MG-UNIT per tablet   Oral   Take 1 tablet by mouth daily.         Marland Kitchen docusate sodium 100 MG CAPS   Oral   Take 100 mg by mouth 2 (two) times daily.   60 capsule   0   . enoxaparin (LOVENOX) 30 MG/0.3ML injection      1 injections sub q  Every 12 hours until INR is 2.0 or higher   10 Syringe   0   . Multiple Vitamin (MULTIVITAMIN WITH MINERALS) TABS   Oral   Take 1 tablet by mouth daily.         Marland Kitchen omeprazole (PRILOSEC) 40 MG capsule   Oral   Take 40 mg by mouth daily.          Marland Kitchen OVER THE COUNTER MEDICATION   Oral   Take 1 tablet by mouth 3 (three) times daily. otc eye vitamin         . oxyCODONE (OXY IR/ROXICODONE) 5 MG immediate release tablet      1-2 tablets every 4 hrs as needed for pain   240 tablet   0   . sertraline (ZOLOFT) 100 MG tablet   Oral   Take 100 mg by mouth every morning.          . valsartan-hydrochlorothiazide (DIOVAN-HCT) 160-12.5 MG per tablet   Oral   Take 1 tablet by mouth every morning.         . vitamin B-12 (CYANOCOBALAMIN) 100 MCG tablet   Oral   Take 100 mcg by mouth daily.         . Vitamin D, Ergocalciferol, (DRISDOL) 50000 UNITS CAPS   Oral   Take 50,000 Units by mouth every 7 (seven) days.         Marland Kitchen warfarin (COUMADIN) 5 MG tablet      Take as directed to maintain INR between 2.0 and 3.0   30 tablet   1    Triage Vitals: BP 163/88  Pulse 73  Temp(Src) 98.7 F (37.1 C) (Oral)  Resp 16  Ht 5\' 4"  (1.626 m)  Wt 204 lb (92.534 kg)  BMI 35 kg/m2  SpO2 100%  Physical Exam  Nursing note and vitals reviewed. Constitutional: She is oriented to person, place, and time. She appears well-developed and well-nourished. No distress.  HENT:  Head: Normocephalic and atraumatic.  Neck: Neck supple. No tracheal deviation  present.  Cardiovascular: Normal rate, regular rhythm, normal heart sounds and intact distal pulses.   +1 DP pulses bilaterally.  Pulmonary/Chest: Effort  normal and breath sounds normal. No respiratory distress.  Abdominal: Soft. There is no tenderness.  Musculoskeletal: Normal range of motion.  Diffuse swelling and tenderness to right lower leg, ankle, and foot with faint erythema of lower anterior leg. No thigh tenderness  Neurological: She is alert and oriented to person, place, and time.  Normal sensation. Normal Strength to RLE.   Skin: Skin is warm and dry.  Psychiatric: She has a normal mood and affect. Her behavior is normal.    ED Course  Procedures (including critical care time)  DIAGNOSTIC STUDIES: Oxygen Saturation is 100% on room air, normal by my interpretation.    COORDINATION OF CARE: 6:45 PM-Discussed treatment plan which includes an venous US of lower unilateral right leg with pt at bedside and pt agreed to plan.     Labs Review Labs Reviewed - No data to display Imaging Review US Venous Img Lower Unilateral Right  09/29/2012   CLINICAL DATA:  Right lower leg pain and swelling. Recent total knee arthroplasty. Question DVT.  EXAM: RIGHT LOWER EXTREMITY VENOUS ULTRASOUND  TECHNIQUE: Gray-scale sonography with graded compression, as well as color Doppler and duplex ultrasound, were performed to evaluate the deep venous system from the level of the common femoral vein through the popliteal and proximal calf veins. Spectral Doppler was utilized to evaluate flow at rest and with distal augmentation maneuvers.  COMPARISON:  None.  FINDINGS: Thrombus within deep veins:  None visualized.  Compressibility of deep veins:  Normal.  Duplex waveform respiratory phasicity:  Normal.  Duplex waveform response to augmentation:  Normal.  Venous reflux:  None visualized.  Lower leg soft tissue edema is noted.  IMPRESSION: No evidence of acute right lower extremity DVT.   Electronically Signed   By: Roxy Horseman   On: 09/29/2012 19:36    MDM   1. Right leg swelling   2. Cellulitis of right leg    DVT ultrasound negative for acute DVT.  Due to her mild erythema with swelling there is concern for cellulitis. No crepitus or severe pain to suggest deeper infection. I discussed with Ortho on-call Dr. Eulah Pont who will try to arrange for patient to be seen in 2-3 days. Will cover with Keflex for a superficial cellulitis. Patient otherwise not appear systemically ill and has no fevers. Discussed her to return precautions with the patient and her family who are okay with going home and treating as an outpatient and followed up with orthopedics in a few days.    I personally performed the services described in this documentation, which was scribed in my presence. The recorded information has been reviewed and is accurate.    Audree Camel, MD 09/29/12 2110

## 2012-09-29 NOTE — ED Notes (Signed)
MD at bedside. 

## 2012-10-01 ENCOUNTER — Ambulatory Visit: Payer: Medicare Other | Admitting: Physical Therapy

## 2012-10-06 ENCOUNTER — Ambulatory Visit: Payer: Medicare Other | Admitting: Physical Therapy

## 2012-10-08 ENCOUNTER — Ambulatory Visit: Payer: Medicare Other | Attending: Orthopedic Surgery | Admitting: Physical Therapy

## 2012-10-08 DIAGNOSIS — IMO0001 Reserved for inherently not codable concepts without codable children: Secondary | ICD-10-CM | POA: Insufficient documentation

## 2012-10-08 DIAGNOSIS — M25669 Stiffness of unspecified knee, not elsewhere classified: Secondary | ICD-10-CM | POA: Insufficient documentation

## 2012-10-08 DIAGNOSIS — M25569 Pain in unspecified knee: Secondary | ICD-10-CM | POA: Insufficient documentation

## 2012-10-08 DIAGNOSIS — Z96659 Presence of unspecified artificial knee joint: Secondary | ICD-10-CM | POA: Insufficient documentation

## 2012-10-08 DIAGNOSIS — R609 Edema, unspecified: Secondary | ICD-10-CM | POA: Insufficient documentation

## 2012-10-13 ENCOUNTER — Ambulatory Visit: Payer: Medicare Other | Admitting: Physical Therapy

## 2012-10-15 ENCOUNTER — Ambulatory Visit: Payer: Medicare Other | Admitting: Physical Therapy

## 2012-10-20 ENCOUNTER — Encounter: Payer: Medicare Other | Admitting: Physical Therapy

## 2012-10-20 ENCOUNTER — Ambulatory Visit: Payer: Medicare Other | Admitting: Physical Therapy

## 2012-10-23 ENCOUNTER — Ambulatory Visit: Payer: Medicare Other | Admitting: Physical Therapy

## 2012-10-26 ENCOUNTER — Ambulatory Visit: Payer: Medicare Other | Admitting: Physical Therapy

## 2012-10-30 ENCOUNTER — Ambulatory Visit: Payer: Medicare Other | Admitting: Physical Therapy

## 2012-11-02 ENCOUNTER — Ambulatory Visit: Payer: Medicare Other | Admitting: Physical Therapy

## 2012-11-03 LAB — PTH, INTACT AND CALCIUM: Calcium: 10.1 mg/dL (ref 8.6–10.2)

## 2012-11-05 ENCOUNTER — Ambulatory Visit: Payer: Medicare Other | Admitting: Physical Therapy

## 2012-11-05 ENCOUNTER — Telehealth (INDEPENDENT_AMBULATORY_CARE_PROVIDER_SITE_OTHER): Payer: Self-pay

## 2012-11-05 ENCOUNTER — Encounter: Payer: Medicare Other | Admitting: Physical Therapy

## 2012-11-05 NOTE — Telephone Encounter (Signed)
11-02-12 labs in epic. Note to Dr Gerrit Friends to review.

## 2012-11-09 ENCOUNTER — Ambulatory Visit: Payer: Medicare Other | Attending: Orthopedic Surgery | Admitting: Physical Therapy

## 2012-11-09 DIAGNOSIS — M25669 Stiffness of unspecified knee, not elsewhere classified: Secondary | ICD-10-CM | POA: Insufficient documentation

## 2012-11-09 DIAGNOSIS — M25569 Pain in unspecified knee: Secondary | ICD-10-CM | POA: Insufficient documentation

## 2012-11-09 DIAGNOSIS — IMO0001 Reserved for inherently not codable concepts without codable children: Secondary | ICD-10-CM | POA: Insufficient documentation

## 2012-11-09 DIAGNOSIS — Z96659 Presence of unspecified artificial knee joint: Secondary | ICD-10-CM | POA: Insufficient documentation

## 2012-11-09 DIAGNOSIS — R609 Edema, unspecified: Secondary | ICD-10-CM | POA: Diagnosis not present

## 2012-11-11 ENCOUNTER — Ambulatory Visit: Payer: Medicare Other | Admitting: Physical Therapy

## 2012-11-11 DIAGNOSIS — IMO0001 Reserved for inherently not codable concepts without codable children: Secondary | ICD-10-CM | POA: Diagnosis not present

## 2012-11-12 ENCOUNTER — Other Ambulatory Visit: Payer: Self-pay

## 2012-11-16 ENCOUNTER — Ambulatory Visit: Payer: Medicare Other | Admitting: Physical Therapy

## 2012-11-16 ENCOUNTER — Telehealth (INDEPENDENT_AMBULATORY_CARE_PROVIDER_SITE_OTHER): Payer: Self-pay

## 2012-11-16 DIAGNOSIS — IMO0001 Reserved for inherently not codable concepts without codable children: Secondary | ICD-10-CM | POA: Diagnosis not present

## 2012-11-16 NOTE — Telephone Encounter (Signed)
Per Dr Ardine Eng note attached to labs pt advised by phone to continue vit d suppliment. Pt advised Calcium normal and PTH improving. Pt will have labs followed by her PCP yearly. Pt states she understands.

## 2012-11-18 ENCOUNTER — Ambulatory Visit: Payer: Medicare Other | Admitting: Physical Therapy

## 2012-11-18 DIAGNOSIS — IMO0001 Reserved for inherently not codable concepts without codable children: Secondary | ICD-10-CM | POA: Diagnosis not present

## 2012-11-23 ENCOUNTER — Ambulatory Visit: Payer: Medicare Other | Admitting: Physical Therapy

## 2012-11-23 DIAGNOSIS — IMO0001 Reserved for inherently not codable concepts without codable children: Secondary | ICD-10-CM | POA: Diagnosis not present

## 2012-11-25 ENCOUNTER — Ambulatory Visit: Payer: Medicare Other | Admitting: Physical Therapy

## 2012-11-25 DIAGNOSIS — IMO0001 Reserved for inherently not codable concepts without codable children: Secondary | ICD-10-CM | POA: Diagnosis not present

## 2012-11-30 ENCOUNTER — Ambulatory Visit: Payer: Medicare Other | Admitting: Physical Therapy

## 2012-11-30 DIAGNOSIS — IMO0001 Reserved for inherently not codable concepts without codable children: Secondary | ICD-10-CM | POA: Diagnosis not present

## 2012-12-02 ENCOUNTER — Ambulatory Visit: Payer: Medicare Other | Admitting: Physical Therapy

## 2012-12-02 DIAGNOSIS — IMO0001 Reserved for inherently not codable concepts without codable children: Secondary | ICD-10-CM | POA: Diagnosis not present

## 2012-12-07 ENCOUNTER — Ambulatory Visit: Payer: Medicare Other | Attending: Orthopedic Surgery | Admitting: Physical Therapy

## 2012-12-07 DIAGNOSIS — IMO0001 Reserved for inherently not codable concepts without codable children: Secondary | ICD-10-CM | POA: Insufficient documentation

## 2012-12-07 DIAGNOSIS — M25669 Stiffness of unspecified knee, not elsewhere classified: Secondary | ICD-10-CM | POA: Insufficient documentation

## 2012-12-07 DIAGNOSIS — M25569 Pain in unspecified knee: Secondary | ICD-10-CM | POA: Insufficient documentation

## 2012-12-07 DIAGNOSIS — Z96659 Presence of unspecified artificial knee joint: Secondary | ICD-10-CM | POA: Insufficient documentation

## 2012-12-07 DIAGNOSIS — R609 Edema, unspecified: Secondary | ICD-10-CM | POA: Insufficient documentation

## 2012-12-09 ENCOUNTER — Ambulatory Visit: Payer: Medicare Other | Admitting: Physical Therapy

## 2012-12-14 ENCOUNTER — Encounter: Payer: Self-pay | Admitting: General Surgery

## 2012-12-14 ENCOUNTER — Ambulatory Visit: Payer: Medicare Other | Admitting: Physical Therapy

## 2012-12-14 ENCOUNTER — Encounter: Payer: Self-pay | Admitting: Cardiology

## 2012-12-16 ENCOUNTER — Ambulatory Visit: Payer: Medicare Other | Admitting: Physical Therapy

## 2013-01-04 ENCOUNTER — Encounter: Payer: Self-pay | Admitting: General Surgery

## 2013-01-04 ENCOUNTER — Telehealth: Payer: Self-pay | Admitting: Cardiology

## 2013-01-04 NOTE — Telephone Encounter (Signed)
Called pt back. Pt no showed 10 week sleep f/u we have never seen in office.

## 2013-01-04 NOTE — Telephone Encounter (Signed)
New question    Pt needs a call back about her C pap machine please.

## 2013-01-05 NOTE — Telephone Encounter (Signed)
Spoke with pt regarding sleep results and get her r/s for her 10 week sleep f/u that she missed back in September

## 2013-01-27 ENCOUNTER — Encounter: Payer: Self-pay | Admitting: Cardiology

## 2013-01-27 ENCOUNTER — Ambulatory Visit (INDEPENDENT_AMBULATORY_CARE_PROVIDER_SITE_OTHER): Payer: Medicare Other | Admitting: Cardiology

## 2013-01-27 ENCOUNTER — Telehealth: Payer: Self-pay | Admitting: Cardiology

## 2013-01-27 VITALS — BP 146/88 | Ht 63.0 in | Wt 189.0 lb

## 2013-01-27 DIAGNOSIS — I10 Essential (primary) hypertension: Secondary | ICD-10-CM

## 2013-01-27 DIAGNOSIS — G4733 Obstructive sleep apnea (adult) (pediatric): Secondary | ICD-10-CM

## 2013-01-27 DIAGNOSIS — E669 Obesity, unspecified: Secondary | ICD-10-CM | POA: Insufficient documentation

## 2013-01-27 NOTE — Telephone Encounter (Signed)
LVM for pt to return call

## 2013-01-27 NOTE — Telephone Encounter (Signed)
Please let patient know that CPAP download showed good AHI at 0.8/hr but needs to improve compliance which was on 37% compliant in using more than 4 hours nightly.  SHe needs to try to use 5-6 hours nightly

## 2013-01-27 NOTE — Patient Instructions (Addendum)
Your physician wants you to follow-up in: 6 months with Dr. Radford Pax. You will receive a reminder letter in the mail two months in advance. If you don't receive a letter, please call our office to schedule the follow-up appointment.

## 2013-01-27 NOTE — Progress Notes (Signed)
[place], [place], [place]  [address] Phone: [phone] Fax:  [phone]  Date:  01/27/2013   ID:  Susan [name], DOB 1940/03/31, MRN [id number]  PCP:  [name], MD  Sleep Medicine:   Fransico Him, MD   History of Present Illness: Susan [name] is a 73 y.o. female who recently presented to her PCP with complaints of possible sleep apnea.  She was apparently going to have PTH surgery and during her preop eval was identified to be in a risk group for OSA.  SHe complained of daytime sleepiness and had been told in the past that she would stop breathing in her sleep.  She also was told that she snored loudly.  She had a moderately high tendency to fall asleep if sitting quietly in the car and sometimes driving.  She underwent PSG showing severe OSA with an AHI of 48/hr and underwent CPAP titration to 12cmH2O.  She now presents today for evaluation.  She tolerates her CPAP device well.  She has no problems with the full face mask and feels the pressure is adequate.  She feels more rested in the am and her daytime sleepiness has improved.  She does not know if she snores anymore.     Wt Readings from Last 3 Encounters:  01/27/13 189 lb (85.73 kg)  09/29/12 204 lb (92.534 kg)  08/18/12 199 lb 3.2 oz (90.357 kg)     Past Medical History  Diagnosis Date  . Hyperlipidemia   . Pedal edema     none in last 6 months  . Spinal stenosis   . Varicose veins     "BLE" (07/29/2012)  . Primary hyperparathyroidism   . Thyroid disease   . Incontinent of urine     wears pads  . Complication of anesthesia     "I have apnea" (07/29/2012)  . Family history of anesthesia complication     "some wake up during OR; some are hard to wake up; some both" (07/29/2012)  . OSA on CPAP   . Arthritis     "legs, back" (07/29/2012)  . Osteoarthritis of right knee   . Umbilical hernia     "unrepaired" (07/29/2012)  . Chronic lower back pain   . History of stomach ulcers 1980's  . Anxiety    . Depression   . GERD (gastroesophageal reflux disease)   . Hypertension     Current Outpatient Prescriptions  Medication Sig Dispense Refill  . ALPRAZolam (XANAX) 0.25 MG tablet Take 0.25 mg by mouth at bedtime as needed for sleep.       Marland Kitchen buPROPion (WELLBUTRIN) 100 MG tablet Take 100 mg by mouth 2 (two) times daily.      . calcium-vitamin D (OSCAL WITH D) 500-200 MG-UNIT per tablet Take 1 tablet by mouth daily.      . Multiple Vitamin (MULTIVITAMIN WITH MINERALS) TABS Take 1 tablet by mouth daily.      Marland Kitchen OVER THE COUNTER MEDICATION Take 1 tablet by mouth 3 (three) times daily. otc eye vitamin      . sertraline (ZOLOFT) 100 MG tablet Take 200 mg by mouth every morning.       . valsartan-hydrochlorothiazide (DIOVAN-HCT) 160-12.5 MG per tablet Take 1 tablet by mouth every morning.      . vitamin B-12 (CYANOCOBALAMIN) 100 MCG tablet Take 100 mcg by mouth daily.       No current facility-administered medications for this visit.    Allergies:  Allergies  Allergen Reactions  . Ace Inhibitors Cough    Pt unsure of this as an allergy  . Lipitor [Atorvastatin] Other (See Comments)    edema  . Simvastatin Other (See Comments)    Stiffness     Social History:  The patient  reports that she quit smoking about 25 years ago. Her smoking use included Cigarettes. She has a 30 pack-year smoking history. She has never used smokeless tobacco. She reports that she drinks alcohol. She reports that she does not use illicit drugs.   Family History:  The patient's family history includes Hypertension in her mother; Lung cancer in her father.   ROS:  Please see the history of present illness.      All other systems reviewed and negative.   PHYSICAL EXAM: VS:  BP 146/88  Ht 5\' 3"  (1.6 m)  Wt 189 lb (85.73 kg)  BMI 33.49 kg/m2 Well nourished, well developed, in no acute distress HEENT: normal Neck: no JVD Cardiac:  normal S1, S2; RRR; no murmur Lungs:  clear to auscultation bilaterally, no  wheezing, rhonchi or rales Abd: soft, nontender, no hepatomegaly Ext: trace edema Skin: warm and dry Neuro:  CNs 2-12 intact, no focal abnormalities noted       ASSESSMENT AND PLAN:  1. OSA on CPAP 2. Obesity  - I have encouraged her to follow a low fat low carb diet and get into an exercise program 3. HTN - mildly elevated  - I have asked her to check her BP a few times for a week at the pharmacy and call with the results  Followup with me in 6 months  Signed, Fransico Him, MD 01/27/2013 11:37 AM

## 2013-01-28 NOTE — Telephone Encounter (Signed)
Follow up    Returned Danielle's call. If she is not at home--pls leave msg on vm

## 2013-01-28 NOTE — Telephone Encounter (Signed)
Pt made aware

## 2013-02-05 ENCOUNTER — Other Ambulatory Visit (HOSPITAL_COMMUNITY): Payer: Self-pay | Admitting: Cardiology

## 2013-02-05 ENCOUNTER — Ambulatory Visit (HOSPITAL_COMMUNITY): Payer: Medicare Other | Attending: Orthopedic Surgery

## 2013-02-05 DIAGNOSIS — I10 Essential (primary) hypertension: Secondary | ICD-10-CM | POA: Insufficient documentation

## 2013-02-05 DIAGNOSIS — M7989 Other specified soft tissue disorders: Secondary | ICD-10-CM | POA: Insufficient documentation

## 2013-02-05 DIAGNOSIS — Z87891 Personal history of nicotine dependence: Secondary | ICD-10-CM | POA: Insufficient documentation

## 2013-02-05 DIAGNOSIS — M79609 Pain in unspecified limb: Secondary | ICD-10-CM

## 2013-02-08 NOTE — Progress Notes (Unsigned)
Called patient to return to the vascular lab for some extra views in the area of interest.  LMOM at home and cell numbers.

## 2013-02-18 ENCOUNTER — Ambulatory Visit: Payer: Medicare Other | Attending: Physical Medicine and Rehabilitation | Admitting: Physical Therapy

## 2013-02-18 DIAGNOSIS — IMO0001 Reserved for inherently not codable concepts without codable children: Secondary | ICD-10-CM | POA: Insufficient documentation

## 2013-02-18 DIAGNOSIS — M25669 Stiffness of unspecified knee, not elsewhere classified: Secondary | ICD-10-CM | POA: Insufficient documentation

## 2013-02-18 DIAGNOSIS — R609 Edema, unspecified: Secondary | ICD-10-CM | POA: Insufficient documentation

## 2013-02-18 DIAGNOSIS — M25569 Pain in unspecified knee: Secondary | ICD-10-CM | POA: Insufficient documentation

## 2013-02-18 DIAGNOSIS — Z96659 Presence of unspecified artificial knee joint: Secondary | ICD-10-CM | POA: Insufficient documentation

## 2013-02-23 ENCOUNTER — Ambulatory Visit: Payer: Medicare Other | Admitting: Physical Therapy

## 2013-02-24 ENCOUNTER — Encounter: Payer: Self-pay | Admitting: Cardiology

## 2013-02-26 ENCOUNTER — Ambulatory Visit: Payer: Medicare Other | Admitting: Physical Therapy

## 2013-03-02 ENCOUNTER — Ambulatory Visit: Payer: Medicare Other | Admitting: Physical Therapy

## 2013-03-05 ENCOUNTER — Emergency Department (HOSPITAL_COMMUNITY)
Admission: EM | Admit: 2013-03-05 | Discharge: 2013-03-05 | Disposition: A | Discharge status: Home / Self Care | Payer: Medicare Other | Attending: Emergency Medicine | Admitting: Emergency Medicine

## 2013-03-05 ENCOUNTER — Encounter (HOSPITAL_COMMUNITY): Payer: Self-pay | Admitting: Emergency Medicine

## 2013-03-05 ENCOUNTER — Emergency Department (HOSPITAL_COMMUNITY): Payer: Medicare Other

## 2013-03-05 ENCOUNTER — Ambulatory Visit: Payer: Medicare Other | Admitting: Physical Therapy

## 2013-03-05 DIAGNOSIS — E119 Type 2 diabetes mellitus without complications: Secondary | ICD-10-CM | POA: Insufficient documentation

## 2013-03-05 DIAGNOSIS — Z8639 Personal history of other endocrine, nutritional and metabolic disease: Secondary | ICD-10-CM | POA: Insufficient documentation

## 2013-03-05 DIAGNOSIS — R609 Edema, unspecified: Secondary | ICD-10-CM | POA: Insufficient documentation

## 2013-03-05 DIAGNOSIS — M545 Low back pain, unspecified: Secondary | ICD-10-CM | POA: Insufficient documentation

## 2013-03-05 DIAGNOSIS — G8929 Other chronic pain: Secondary | ICD-10-CM | POA: Insufficient documentation

## 2013-03-05 DIAGNOSIS — G4733 Obstructive sleep apnea (adult) (pediatric): Secondary | ICD-10-CM | POA: Insufficient documentation

## 2013-03-05 DIAGNOSIS — Z87891 Personal history of nicotine dependence: Secondary | ICD-10-CM | POA: Insufficient documentation

## 2013-03-05 DIAGNOSIS — Z7982 Long term (current) use of aspirin: Secondary | ICD-10-CM | POA: Insufficient documentation

## 2013-03-05 DIAGNOSIS — IMO0002 Reserved for concepts with insufficient information to code with codable children: Secondary | ICD-10-CM | POA: Insufficient documentation

## 2013-03-05 DIAGNOSIS — F411 Generalized anxiety disorder: Secondary | ICD-10-CM | POA: Insufficient documentation

## 2013-03-05 DIAGNOSIS — F329 Major depressive disorder, single episode, unspecified: Secondary | ICD-10-CM | POA: Insufficient documentation

## 2013-03-05 DIAGNOSIS — M1710 Unilateral primary osteoarthritis, unspecified knee: Secondary | ICD-10-CM | POA: Insufficient documentation

## 2013-03-05 DIAGNOSIS — Z96659 Presence of unspecified artificial knee joint: Secondary | ICD-10-CM | POA: Insufficient documentation

## 2013-03-05 DIAGNOSIS — Z79899 Other long term (current) drug therapy: Secondary | ICD-10-CM | POA: Insufficient documentation

## 2013-03-05 DIAGNOSIS — I10 Essential (primary) hypertension: Secondary | ICD-10-CM | POA: Insufficient documentation

## 2013-03-05 DIAGNOSIS — Z862 Personal history of diseases of the blood and blood-forming organs and certain disorders involving the immune mechanism: Secondary | ICD-10-CM | POA: Insufficient documentation

## 2013-03-05 DIAGNOSIS — Z9981 Dependence on supplemental oxygen: Secondary | ICD-10-CM | POA: Insufficient documentation

## 2013-03-05 DIAGNOSIS — R079 Chest pain, unspecified: Secondary | ICD-10-CM | POA: Insufficient documentation

## 2013-03-05 DIAGNOSIS — Z8711 Personal history of peptic ulcer disease: Secondary | ICD-10-CM | POA: Insufficient documentation

## 2013-03-05 DIAGNOSIS — F3289 Other specified depressive episodes: Secondary | ICD-10-CM | POA: Insufficient documentation

## 2013-03-05 LAB — CBC
HEMATOCRIT: 34.1 % — AB (ref 36.0–46.0)
Hemoglobin: 11.5 g/dL — ABNORMAL LOW (ref 12.0–15.0)
MCH: 30.7 pg (ref 26.0–34.0)
MCHC: 33.7 g/dL (ref 30.0–36.0)
MCV: 90.9 fL (ref 78.0–100.0)
Platelets: 259 10*3/uL (ref 150–400)
RBC: 3.75 MIL/uL — ABNORMAL LOW (ref 3.87–5.11)
RDW: 13.9 % (ref 11.5–15.5)
WBC: 4.2 10*3/uL (ref 4.0–10.5)

## 2013-03-05 LAB — I-STAT CHEM 8, ED
BUN: 15 mg/dL (ref 6–23)
Calcium, Ion: 1.32 mmol/L — ABNORMAL HIGH (ref 1.13–1.30)
Chloride: 107 mEq/L (ref 96–112)
Creatinine, Ser: 1 mg/dL (ref 0.50–1.10)
GLUCOSE: 88 mg/dL (ref 70–99)
HEMATOCRIT: 36 % (ref 36.0–46.0)
Hemoglobin: 12.2 g/dL (ref 12.0–15.0)
POTASSIUM: 4 meq/L (ref 3.7–5.3)
Sodium: 142 mEq/L (ref 137–147)
TCO2: 25 mmol/L (ref 0–100)

## 2013-03-05 LAB — URINALYSIS, ROUTINE W REFLEX MICROSCOPIC
Bilirubin Urine: NEGATIVE
Glucose, UA: NEGATIVE mg/dL
HGB URINE DIPSTICK: NEGATIVE
Ketones, ur: NEGATIVE mg/dL
NITRITE: NEGATIVE
PROTEIN: NEGATIVE mg/dL
Specific Gravity, Urine: 1.014 (ref 1.005–1.030)
UROBILINOGEN UA: 0.2 mg/dL (ref 0.0–1.0)
pH: 6.5 (ref 5.0–8.0)

## 2013-03-05 LAB — I-STAT TROPONIN, ED: Troponin i, poc: 0.01 ng/mL (ref 0.00–0.08)

## 2013-03-05 LAB — PROTIME-INR
INR: 0.97 (ref 0.00–1.49)
Prothrombin Time: 12.7 seconds (ref 11.6–15.2)

## 2013-03-05 LAB — D-DIMER, QUANTITATIVE (NOT AT ARMC): D DIMER QUANT: 1.97 ug{FEU}/mL — AB (ref 0.00–0.48)

## 2013-03-05 LAB — URINE MICROSCOPIC-ADD ON

## 2013-03-05 LAB — PRO B NATRIURETIC PEPTIDE: Pro B Natriuretic peptide (BNP): 629 pg/mL — ABNORMAL HIGH (ref 0–450)

## 2013-03-05 MED ORDER — PANTOPRAZOLE SODIUM 20 MG PO TBEC: 20.0000 mg | DELAYED_RELEASE_TABLET | Freq: Every day | ORAL | Status: DC

## 2013-03-05 MED ORDER — IOHEXOL 350 MG/ML SOLN
100.0000 mL | Freq: Once | INTRAVENOUS | Status: AC | PRN
  Administered 2013-03-05: 100 mL via INTRAVENOUS

## 2013-03-05 MED ORDER — ONDANSETRON HCL 4 MG/2ML IJ SOLN
4.0000 mg | Freq: Once | INTRAMUSCULAR | Status: AC
  Administered 2013-03-05: 4 mg via INTRAVENOUS
  Filled 2013-03-05: qty 2

## 2013-03-05 MED ORDER — MORPHINE SULFATE 2 MG/ML IJ SOLN
2.0000 mg | Freq: Once | INTRAMUSCULAR | Status: AC
  Administered 2013-03-05: 2 mg via INTRAVENOUS
  Filled 2013-03-05: qty 1

## 2013-03-05 NOTE — ED Provider Notes (Signed)
CSN: [id number]     Arrival date & time 03/05/13  1137 History   First MD Initiated Contact with Patient 03/05/13 1147     Chief Complaint  Patient presents with  . Chest Pain     (Consider location/radiation/quality/duration/timing/severity/associated sxs/prior Treatment) Patient is a 73 y.o. female presenting with chest pain. The history is provided by the patient and a relative.  Chest Pain  She is here for evaluation of chest pain that has been constant for one week. This morning, she called EMS to bring her here for evaluation. They recommended that she take a full-strength aspirin, which she did. She does not take aspirin regularly. During transport, she was treated with 2 sublingual nitroglycerin with reported improvement of her chest pain. However on my questioning, she states that he did not change her pain. The pain has been constant both day and night. It does not keep her awake however. There are no other associated symptoms. She denies radiation of the pain, diaphoresis, nausea, vomiting, new back pain or abdominal pain. She occasionally has dyspnea on exertion, but does not have dyspnea at rest. She has never had a cardiac evaluation. There are no other known modifying factors.  Past Medical History  Diagnosis Date  . Hyperlipidemia   . Pedal edema     none in last 6 months  . Spinal stenosis   . Varicose veins     "BLE" (07/29/2012)  . Primary hyperparathyroidism   . Thyroid disease   . Incontinent of urine     wears pads  . Complication of anesthesia     "I have apnea" (07/29/2012)  . Family history of anesthesia complication     "some wake up during OR; some are hard to wake up; some both" (07/29/2012)  . OSA on CPAP     "dx'd just last week; doesn't wear mask" (07/29/2012)  . Arthritis     "legs, back" (07/29/2012)  . Osteoarthritis of right knee   . Umbilical hernia     "unrepaired" (07/29/2012)  . Chronic lower back pain   . History of stomach ulcers 1980's  .  Anxiety   . Depression   . GERD (gastroesophageal reflux disease)   . Hypertension    Past Surgical History  Procedure Laterality Date  . Iud removal  1980's  . Parathyroidectomy N/A 06/16/2012    Procedure: NECK EXPLORATION AND LEFT SUPERIOR PARATHYROIDECTOMY;  Surgeon: Earnstine Regal, MD;  Location: [facility];  Service: General;  Laterality: N/A;  . Colonoscopy    . 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 Cholecystectomy  ~ 2010  . Total knee arthroplasty Right 07/29/2012    Procedure: TOTAL KNEE ARTHROPLASTY;  Surgeon: Ninetta Lights, MD;  Location: [facility];  Service: Orthopedics;  Laterality: Right;   Family History  Problem Relation Age of Onset  . Hypertension Mother     deceased  . Lung cancer Father     deceased   History  Substance Use Topics  . Smoking status: Former Smoker -- 1.00 packs/day for 30 years    Types: Cigarettes    Quit date: 01/08/1988  . Smokeless tobacco: Never Used  . Alcohol Use: No   OB History   Grav Para Term Preterm Abortions TAB SAB Ect Mult Living                 Review of Systems  Cardiovascular: Positive for chest pain.  All other systems reviewed and are negative.  Allergies  Ace inhibitors; Lipitor; and Simvastatin  Home Medications   Current Outpatient Rx  Name  Route  Sig  Dispense  Refill  . ALPRAZolam (XANAX) 0.25 MG tablet   Oral   Take 0.25 mg by mouth at bedtime as needed for sleep.          Marland Kitchen buPROPion (WELLBUTRIN) 100 MG tablet   Oral   Take 100 mg by mouth 2 (two) times daily.         . Multiple Vitamin (MULTIVITAMIN WITH MINERALS) TABS   Oral   Take 1 tablet by mouth daily.         Marland Kitchen OVER THE COUNTER MEDICATION   Oral   Take 2 tablets by mouth 2 (two) times daily. otc eye vitamin         . valsartan-hydrochlorothiazide (DIOVAN-HCT) 160-12.5 MG per tablet   Oral   Take 1 tablet by mouth every morning.         . vitamin B-12 (CYANOCOBALAMIN) 100 MCG tablet   Oral   Take 100 mcg by mouth  daily.         . pantoprazole (PROTONIX) 20 MG tablet   Oral   Take 1 tablet (20 mg total) by mouth daily.   30 tablet   0    BP 172/97  Pulse 66  Resp 16  SpO2 99% Physical Exam  Nursing note and vitals reviewed. Constitutional: She is oriented to person, place, and time. She appears well-developed and well-nourished.  Elderly, vigorous  HENT:  Head: Normocephalic and atraumatic.  Eyes: Conjunctivae and EOM are normal. Pupils are equal, round, and reactive to light.  Neck: Normal range of motion and phonation normal. Neck supple.  Cardiovascular: Normal rate, regular rhythm and intact distal pulses.   Pulmonary/Chest: Effort normal and breath sounds normal. She exhibits no tenderness.  Abdominal: Soft. She exhibits no distension. There is no tenderness. There is no guarding.  Musculoskeletal: Normal range of motion. She exhibits edema (1+ bilateral lower leg) and tenderness (shins are tender to touch bilaterally. There is no distinct calf tenderness of either calf).  Neurological: She is alert and oriented to person, place, and time. She exhibits normal muscle tone.  Skin: Skin is warm and dry.  Psychiatric: She has a normal mood and affect. Her behavior is normal. Judgment and thought content normal.    ED Course  Procedures (including critical care time)  Medications  morphine 2 MG/ML injection 2 mg (2 mg Intravenous Given 03/05/13 1356)  ondansetron (ZOFRAN) injection 4 mg (4 mg Intravenous Given 03/05/13 1356)  morphine 2 MG/ML injection 2 mg (2 mg Intravenous Given 03/05/13 1747)  iohexol (OMNIPAQUE) 350 MG/ML injection 100 mL (100 mLs Intravenous Contrast Given 03/05/13 1807)    Patient Vitals for the past 24 hrs:  BP Pulse Resp SpO2  03/05/13 1915 172/97 mmHg - 16 -  03/05/13 1841 155/89 mmHg 66 14 99 %  03/05/13 1730 155/77 mmHg 69 13 96 %  03/05/13 1700 129/84 mmHg 69 19 91 %  03/05/13 1600 165/91 mmHg 67 19 98 %  03/05/13 1530 157/92 mmHg - 16 -  03/05/13 1500  153/84 mmHg 65 14 95 %  03/05/13 1434 149/85 mmHg 67 19 98 %  03/05/13 1430 149/85 mmHg 74 20 95 %  03/05/13 1400 145/81 mmHg 64 16 99 %  03/05/13 1356 147/86 mmHg 66 20 98 %  03/05/13 1330 146/92 mmHg 70 18 97 %  03/05/13 1320 157/85 mmHg 67 18 100 %  03/05/13 1204 124/74 mmHg 66 12 98 %    8:25 PM Reevaluation with update and discussion. After initial assessment and treatment, an updated evaluation reveals she is comfortable. Now, eating a sandwich and has no abdominal pain. Findings discussed with patient and family. One of her daughters is concerned that she was too weak to walk today, so an ambulation trial is initiated. Kyrstan Gotwalt L   Evaluation after ambulation trial- she is able to ambulate easily, but was noted to have slightly low pulse oxygenation 88% on room air. This improved to normal when she sat down. She has sleep apnea and uses CPAP at home, but does not use oxygen. The patient states that she has had problems like this previously. She plans on talking to her PCP about it when she follows up with her next week.  Labs Review Labs Reviewed  CBC - Abnormal; Notable for the following:    RBC 3.75 (*)    Hemoglobin 11.5 (*)    HCT 34.1 (*)    All other components within normal limits  PRO B NATRIURETIC PEPTIDE - Abnormal; Notable for the following:    Pro B Natriuretic peptide (BNP) 629.0 (*)    All other components within normal limits  URINALYSIS, ROUTINE W REFLEX MICROSCOPIC - Abnormal; Notable for the following:    Leukocytes, UA TRACE (*)    All other components within normal limits  D-DIMER, QUANTITATIVE - Abnormal; Notable for the following:    D-Dimer, Quant 1.97 (*)    All other components within normal limits  URINE MICROSCOPIC-ADD ON - Abnormal; Notable for the following:    Bacteria, UA FEW (*)    All other components within normal limits  I-STAT CHEM 8, ED - Abnormal; Notable for the following:    Calcium, Ion 1.32 (*)    All other components within  normal limits  PROTIME-INR  Randolm Idol, ED   Imaging Review Dg Chest 2 View  03/05/2013   CLINICAL DATA:  Chest pain.  Hypertension.  EXAM: CHEST  2 VIEW  COMPARISON:  06/09/2012  FINDINGS: Mild cardiomegaly and pulmonary vascular congestion are stable. No evidence of acute infiltrate or edema. No evidence of pleural effusion. No mass or lymphadenopathy identified. Tortuosity of thoracic aorta remains stable.  IMPRESSION: Stable cardiomegaly and chronic pulmonary vascular congestion. No active disease.   Electronically Signed   By: Earle Gell M.D.   On: 03/05/2013 12:47   Ct Angio Chest Pe W/cm &/or Wo Cm  03/05/2013   CLINICAL DATA:  Chest pain, dizziness  EXAM: CT ANGIOGRAPHY CHEST WITH CONTRAST  TECHNIQUE: Multidetector CT imaging of the chest was performed using the standard protocol during bolus administration of intravenous contrast. Multiplanar CT image reconstructions and MIPs were obtained to evaluate the vascular anatomy.  CONTRAST:  164mL OMNIPAQUE IOHEXOL 350 MG/ML SOLN  COMPARISON:  Chest x-ray same day  FINDINGS: Sagittal images of the spine shows degenerative changes thoracic spine. Central airways are patent. Images of the thoracic inlet are unremarkable. The study is of excellent technical quality. No pulmonary embolus is noted. Central mild peribronchial thickening is noted.  Atherosclerotic calcifications of thoracic aorta. Cardiomegaly is noted. There is a cyst in upper pole of the left kidney measures 3.7 cm.  Images of the lung parenchyma shows no segmental infiltrate. There is patchy ground-glass attenuation bilateral lower lobe most likely due to hypoventilatory changes rather then mild interstitial edema. Small hiatal hernia is noted. Central mild vascular congestion. There is small right pleural effusion. Trace  left pleural effusion is noted. Small amount of fluid noted along bilateral major fissure.  Review of the MIP images confirms the above findings.  IMPRESSION: 1. No  pulmonary embolus is noted. 2. Degenerative changes thoracic spine. 3. Bilateral mild peribronchial thickening. 4. Central vascular congestion. Mild interstitial prominence and patchy ground-glass attenuation bilateral lower lobe with small pleural effusion bilaterally. Findings may be due to mild interstitial edema or hypoventilatory changes. No segmental infiltrate. 5. There is a cyst in upper pole of the left kidney measures 3.7 cm.   Electronically Signed   By: Lahoma Crocker M.D.   On: 03/05/2013 18:30     EKG Interpretation   Date/Time:  Friday March 05 2013 11:51:16 EST Ventricular Rate:  71 PR Interval:  169 QRS Duration: 104 QT Interval:  394 QTC Calculation: 428 R Axis:   -43 Text Interpretation:  Sinus rhythm Ventricular trigeminy Probable left  atrial enlargement Abnormal R-wave progression, late transition Left  ventricular hypertrophy Borderline T abnormalities, lateral leads Artifact  in lead(s) I II III aVR aVL aVF since last tracing no significant change  Confirmed by Maniya Donovan  MD, Maire Govan ([id number]) on 03/05/2013 12:20:27 PM      MDM   Final diagnoses:  Nonspecific chest pain    Nonspecific chest pain complaints with negative EP evaluation for cardiac or pulmonary problems of an acute nature. She is at low risk for coronary artery disease.  Nursing Notes Reviewed/ Care Coordinated Applicable Imaging Reviewed Interpretation of Laboratory Data incorporated into ED treatment  The patient appears reasonably screened and/or stabilized for discharge and I doubt any other medical condition or other [facility] requiring further screening, evaluation, or treatment in the ED at this time prior to discharge.  Plan: Home Medications- Protonix, Antacid; Home Treatments- Rest, avoid caffeine; return here if the recommended treatment, does not improve the symptoms; Recommended follow up- PCP for check up in 1 week. At that time, she should be reassessed for possible GERD, and evaluation of her  transient hypoxia while ambulating.    Richarda Blade, MD 03/05/13 [phone]

## 2013-03-05 NOTE — Discharge Instructions (Signed)
°  There were no significant abnormalities found on examination. You may have some reflux or acid problems causing your discomfort. We are going to start some medicine to decrease your acid production It is also important to take Maalox or Mylanta before meals and at bedtime for one week. Discuss the pain and discomfort with your doctor when you see her next week. Also ask her to check into your oxygen levels and see if you need further treatment for that.  Chest Pain (Nonspecific) Chest pain has many causes. Your pain could be caused by something serious, such as a heart attack or a blood clot in the lungs. It could also be caused by something less serious, such as a chest bruise or a virus. Follow up with your doctor. More lab tests or other studies may be needed to find the cause of your pain. Most of the time, nonspecific chest pain will improve within 2 to 3 days of rest and mild pain medicine. HOME CARE  For chest bruises, you may put ice on the sore area for 15-20 minutes, 03-04 times a day. Do this only if it makes you feel better.  Put ice in a plastic bag.  Place a towel between the skin and the bag.  Rest for the next 2 to 3 days.  Go back to work if the pain improves.  See your doctor if the pain lasts longer than 1 to 2 weeks.  Only take medicine as told by your doctor.  Quit smoking if you smoke. GET HELP RIGHT AWAY IF:   There is more pain or pain that spreads to the arm, neck, jaw, back, or belly (abdomen).  You have shortness of breath.  You cough more than usual or cough up blood.  You have very bad back or belly pain, feel sick to your stomach (nauseous), or throw up (vomit).  You have very bad weakness.  You pass out (faint).  You have a fever. Any of these problems may be serious and may be an emergency. Do not wait to see if the problems will go away. Get medical help right away. Call your local emergency services 911 in [place] Do not drive yourself to the  hospital. MAKE SURE YOU:   Understand these instructions.  Will watch this condition.  Will get help right away if you or your child is not doing well or gets worse. Document Released: 06/12/2007 Document Revised: 03/18/2011 Document Reviewed: 06/12/2007 [facility] Patient Information 2014 [place], [place].

## 2013-03-05 NOTE — ED Notes (Signed)
[facility] presents with a 74 yo female from home with CP.  Pt states she has had this pain for one week but became worse this morning with dull pain in center of chest, dizziness, lightheadedness and SOB.  Pt took 324 ASA at home; [facility] gave 2 NTG and CP went from 5 out of 10 at home to 1 out of 10 pain at this facility.  No N/V/D. Hx of HTN, and depression. NSR on 12 lead.

## 2013-03-05 NOTE — ED Notes (Signed)
Ambulated pt in hallway. Walked without difficulty. Pt o2 dropped to 88% while ambulated. But after sitting back down o2 came back up to 96%. MD made aware.

## 2013-03-09 ENCOUNTER — Ambulatory Visit: Payer: Medicare Other | Attending: Orthopedic Surgery | Admitting: Physical Therapy

## 2013-03-09 DIAGNOSIS — Z96659 Presence of unspecified artificial knee joint: Secondary | ICD-10-CM | POA: Insufficient documentation

## 2013-03-09 DIAGNOSIS — IMO0001 Reserved for inherently not codable concepts without codable children: Secondary | ICD-10-CM | POA: Insufficient documentation

## 2013-03-09 DIAGNOSIS — R609 Edema, unspecified: Secondary | ICD-10-CM | POA: Insufficient documentation

## 2013-03-09 DIAGNOSIS — M25669 Stiffness of unspecified knee, not elsewhere classified: Secondary | ICD-10-CM | POA: Insufficient documentation

## 2013-03-09 DIAGNOSIS — M25569 Pain in unspecified knee: Secondary | ICD-10-CM | POA: Insufficient documentation

## 2013-03-12 ENCOUNTER — Ambulatory Visit: Payer: Medicare Other | Admitting: Physical Therapy

## 2013-03-16 ENCOUNTER — Ambulatory Visit: Payer: Medicare Other | Admitting: Physical Therapy

## 2013-03-19 ENCOUNTER — Ambulatory Visit: Payer: Medicare Other | Admitting: Physical Therapy

## 2013-03-23 ENCOUNTER — Ambulatory Visit: Payer: Medicare Other | Admitting: Physical Therapy

## 2013-03-25 ENCOUNTER — Ambulatory Visit: Payer: Medicare Other | Admitting: Physical Therapy

## 2013-03-26 ENCOUNTER — Telehealth: Payer: Self-pay | Admitting: General Surgery

## 2013-03-26 ENCOUNTER — Ambulatory Visit (INDEPENDENT_AMBULATORY_CARE_PROVIDER_SITE_OTHER): Payer: Medicare Other | Admitting: Cardiology

## 2013-03-26 ENCOUNTER — Encounter: Payer: Self-pay | Admitting: Cardiology

## 2013-03-26 VITALS — BP 137/74 | HR 71 | Ht 62.0 in | Wt 193.8 lb

## 2013-03-26 DIAGNOSIS — I10 Essential (primary) hypertension: Secondary | ICD-10-CM

## 2013-03-26 DIAGNOSIS — R0602 Shortness of breath: Secondary | ICD-10-CM | POA: Insufficient documentation

## 2013-03-26 DIAGNOSIS — R079 Chest pain, unspecified: Secondary | ICD-10-CM

## 2013-03-26 LAB — BRAIN NATRIURETIC PEPTIDE: PRO B NATRI PEPTIDE: 257 pg/mL — AB (ref 0.0–100.0)

## 2013-03-26 NOTE — Telephone Encounter (Signed)
yes

## 2013-03-26 NOTE — Progress Notes (Signed)
[place] [place], [place]  [address] Phone: [phone] Fax:  [phone]  Date:  03/26/2013   ID:  Susan [name], DOB 15-Sep-1940, MRN [id number]  PCP:  [name], MD  Cardiologist:  Fransico Him, MD   History of Present Illness: Susan [name] is a 73 y.o. female with a history of OSA and HTN presents today for evaluation of Chest pain.  She was recently seen in the ER in late February with complaints of CP.  The pain was a constant pain for over a week with no associated symptoms and no radiation.  She also was complaining that she was SOB with a whistling noise in her chest.   Chest CT angio showed no PE with ? Very mild interstitial edema.  EKG showed NSR with ventricular bigeminy.  Her CP was felt to be nonspecific and she was sent home with instructions to followup as an outpt.  She saw her PCP the following week and she was referred her for further evaluation.  She says that her chest pain has resolved.  She says that she has been having some LE edema. She says that the SOB has continued and she has to stop to catch her breath.  She occasionally notices her heart beating fast.     Wt Readings from Last 3 Encounters:  01/27/13 189 lb (85.73 kg)  09/29/12 204 lb (92.534 kg)  08/18/12 199 lb 3.2 oz (90.357 kg)     Past Medical History  Diagnosis Date  . Hyperlipidemia   . Pedal edema     none in last 6 months  . Spinal stenosis   . Varicose veins     "BLE" (07/29/2012)  . Primary hyperparathyroidism   . Thyroid disease   . Incontinent of urine     wears pads  . Complication of anesthesia     "I have apnea" (07/29/2012)  . Family history of anesthesia complication     "some wake up during OR; some are hard to wake up; some both" (07/29/2012)  . OSA on CPAP     "dx'd just last week; doesn't wear mask" (07/29/2012)  . Arthritis     "legs, back" (07/29/2012)  . Osteoarthritis of right knee   . Umbilical hernia     "unrepaired" (07/29/2012)  . Chronic  lower back pain   . History of stomach ulcers 1980's  . Anxiety   . Depression   . GERD (gastroesophageal reflux disease)   . Hypertension     Current Outpatient Prescriptions  Medication Sig Dispense Refill  . ALPRAZolam (XANAX) 0.25 MG tablet Take 0.25 mg by mouth at bedtime as needed for sleep.       Marland Kitchen buPROPion (WELLBUTRIN) 100 MG tablet Take 100 mg by mouth 2 (two) times daily.      . Multiple Vitamin (MULTIVITAMIN WITH MINERALS) TABS Take 1 tablet by mouth daily.      Marland Kitchen OVER THE COUNTER MEDICATION Take 2 tablets by mouth 2 (two) times daily. otc eye vitamin      . pantoprazole (PROTONIX) 20 MG tablet Take 1 tablet (20 mg total) by mouth daily.  30 tablet  0  . valsartan-hydrochlorothiazide (DIOVAN-HCT) 160-12.5 MG per tablet Take 1 tablet by mouth every morning.      . vitamin B-12 (CYANOCOBALAMIN) 100 MCG tablet Take 100 mcg by mouth daily.       No current facility-administered medications for this visit.    Allergies:    Allergies  Allergen Reactions  . Ace Inhibitors Cough    Pt unsure of this as an allergy  . Lipitor [Atorvastatin] Other (See Comments)    edema  . Simvastatin Other (See Comments)    Stiffness     Social History:  The patient  reports that she quit smoking about 25 years ago. Her smoking use included Cigarettes. She has a 30 pack-year smoking history. She has never used smokeless tobacco. She reports that she does not drink alcohol or use illicit drugs.   Family History:  The patient's family history includes Hypertension in her mother; Lung cancer in her father.   ROS:  Please see the history of present illness.      All other systems reviewed and negative.   PHYSICAL EXAM: VS:  There were no vitals taken for this visit. Well nourished, well developed, in no acute distress HEENT: normal Neck: no JVD Cardiac:  normal S1, S2; RRR; no murmur Lungs:  clear to auscultation bilaterally, no wheezing, rhonchi or rales Abd: soft, nontender, no  hepatomegaly Ext: no edema Skin: warm and dry Neuro:  CNs 2-12 intact, no focal abnormalities noted       ASSESSMENT AND PLAN:  1. Atypical Chest pain - Stress myoview to rule out ischemia - 2D echo to assess LVF 2. HTN - well controlled - continue Diovan which he had run out  3.  SOB which could be due to diastolic CHF - she ran out of her Diovan about the time her symptoms started.  Also need to rule out ischemia. - check BNP  Followup with me in 1 week after her studies are completed  Signed, Fransico Him, MD 03/26/2013 11:55 AM

## 2013-03-26 NOTE — Patient Instructions (Signed)
Your physician recommends that you continue on your current medications as directed. Please refer to the Current Medication list given to you today.  Your physician recommends that you go to the lab today for a BNP  Your physician has requested that you have an echocardiogram. Echocardiography is a painless test that uses sound waves to create images of your heart. It provides your doctor with information about the size and shape of your heart and how well your heart's chambers and valves are working. This procedure takes approximately one hour. There are no restrictions for this procedure.  Your physician has requested that you have an exercise stress myoview. For further information please visit [email or web address]. Please follow instruction sheet, as given.  Your physician recommends that you schedule a follow-up appointment: A Week after all the testing is done with Dr Radford Pax

## 2013-03-26 NOTE — Telephone Encounter (Signed)
Right after you left, the pt showed me a letter from [facility] stating pt has not been compliant with machine and in order to keep machine and not have to pay for study she needs to have a repeat sleep study. Is it ok to schedule pt for repeat sleep study with [facility]. ?

## 2013-03-29 ENCOUNTER — Other Ambulatory Visit: Payer: Self-pay | Admitting: General Surgery

## 2013-03-29 ENCOUNTER — Telehealth: Payer: Self-pay | Admitting: Cardiology

## 2013-03-29 DIAGNOSIS — Z79899 Other long term (current) drug therapy: Secondary | ICD-10-CM

## 2013-03-29 DIAGNOSIS — G4733 Obstructive sleep apnea (adult) (pediatric): Secondary | ICD-10-CM

## 2013-03-29 MED ORDER — FUROSEMIDE 20 MG PO TABS: ORAL_TABLET | ORAL | Status: DC

## 2013-03-29 MED ORDER — VALSARTAN 160 MG PO TABS: 160.0000 mg | ORAL_TABLET | Freq: Every day | ORAL | Status: DC

## 2013-03-29 NOTE — Progress Notes (Signed)
yes

## 2013-03-29 NOTE — Telephone Encounter (Signed)
Spoke with pt resolved issue.

## 2013-03-29 NOTE — Telephone Encounter (Signed)
New message         Pt would like to speak about her next appt on 4/7 she says there is an issue. Please call her back. Thanks

## 2013-03-29 NOTE — Telephone Encounter (Signed)
Pt is aware [facility] is the one that should be scheduling.

## 2013-03-29 NOTE — Telephone Encounter (Signed)
Follow Up  Pt called to schedule sleep Study.. Please assist

## 2013-03-29 NOTE — Telephone Encounter (Signed)
Ordered Sleep Study for pt.

## 2013-03-30 ENCOUNTER — Ambulatory Visit: Payer: Medicare Other | Admitting: Physical Therapy

## 2013-04-01 ENCOUNTER — Ambulatory Visit: Payer: Medicare Other | Admitting: Physical Therapy

## 2013-04-01 ENCOUNTER — Encounter: Payer: Medicare Other | Admitting: Physical Therapy

## 2013-04-06 ENCOUNTER — Ambulatory Visit: Payer: Medicare Other | Admitting: Physical Therapy

## 2013-04-07 ENCOUNTER — Encounter: Payer: Self-pay | Admitting: Cardiology

## 2013-04-11 ENCOUNTER — Telehealth: Payer: Self-pay | Admitting: Cardiology

## 2013-04-11 NOTE — Telephone Encounter (Signed)
Please let patient know that she has severe OSA and set up for CPAP titration at  [facility]

## 2013-04-13 ENCOUNTER — Ambulatory Visit (HOSPITAL_COMMUNITY): Payer: Medicare Other | Attending: Cardiology | Admitting: Radiology

## 2013-04-13 ENCOUNTER — Encounter: Payer: Self-pay | Admitting: General Surgery

## 2013-04-13 ENCOUNTER — Ambulatory Visit (INDEPENDENT_AMBULATORY_CARE_PROVIDER_SITE_OTHER): Payer: Medicare Other | Admitting: *Deleted

## 2013-04-13 ENCOUNTER — Ambulatory Visit (HOSPITAL_BASED_OUTPATIENT_CLINIC_OR_DEPARTMENT_OTHER): Payer: Medicare Other | Admitting: Radiology

## 2013-04-13 VITALS — BP 179/107 | HR 63 | Ht 62.0 in | Wt 192.0 lb

## 2013-04-13 DIAGNOSIS — R002 Palpitations: Secondary | ICD-10-CM | POA: Insufficient documentation

## 2013-04-13 DIAGNOSIS — R0602 Shortness of breath: Secondary | ICD-10-CM

## 2013-04-13 DIAGNOSIS — R079 Chest pain, unspecified: Secondary | ICD-10-CM

## 2013-04-13 DIAGNOSIS — I10 Essential (primary) hypertension: Secondary | ICD-10-CM | POA: Insufficient documentation

## 2013-04-13 DIAGNOSIS — Z87891 Personal history of nicotine dependence: Secondary | ICD-10-CM | POA: Insufficient documentation

## 2013-04-13 DIAGNOSIS — R0609 Other forms of dyspnea: Secondary | ICD-10-CM | POA: Insufficient documentation

## 2013-04-13 DIAGNOSIS — R0989 Other specified symptoms and signs involving the circulatory and respiratory systems: Secondary | ICD-10-CM | POA: Insufficient documentation

## 2013-04-13 DIAGNOSIS — E785 Hyperlipidemia, unspecified: Secondary | ICD-10-CM

## 2013-04-13 LAB — BASIC METABOLIC PANEL
BUN: 18 mg/dL (ref 6–23)
CALCIUM: 10.3 mg/dL (ref 8.4–10.5)
CO2: 29 mEq/L (ref 19–32)
Chloride: 106 mEq/L (ref 96–112)
Creatinine, Ser: 1 mg/dL (ref 0.4–1.2)
GFR: 73.71 mL/min (ref >60.00)
GLUCOSE: 86 mg/dL (ref 70–99)
POTASSIUM: 4 meq/L (ref 3.5–5.1)
SODIUM: 141 meq/L (ref 135–145)

## 2013-04-13 MED ORDER — REGADENOSON 0.4 MG/5ML IV SOLN
0.4000 mg | Freq: Once | INTRAVENOUS | Status: AC
  Administered 2013-04-13: 0.4 mg via INTRAVENOUS

## 2013-04-13 MED ORDER — TECHNETIUM TC 99M SESTAMIBI GENERIC - CARDIOLITE
10.8000 | Freq: Once | INTRAVENOUS | Status: AC | PRN
  Administered 2013-04-13: 11 via INTRAVENOUS

## 2013-04-13 MED ORDER — TECHNETIUM TC 99M SESTAMIBI GENERIC - CARDIOLITE
33.0000 | Freq: Once | INTRAVENOUS | Status: AC | PRN
  Administered 2013-04-13: 33 via INTRAVENOUS

## 2013-04-13 NOTE — Telephone Encounter (Signed)
Lm with Granddaughter to have pt call back. Paper work filled out for pt.

## 2013-04-13 NOTE — Progress Notes (Signed)
Echocardiogram performed.  

## 2013-04-13 NOTE — Progress Notes (Signed)
[facility] [address] [place], [place] [address] [phone]    Cardiology Nuclear Med Study  Susan [name] is a 73 y.o. female     MRN : [id number]     DOB: 04/15/1940  Procedure Date: 04/13/2013  Nuclear Med Background Indication for Stress Test:  Evaluation for Ischemia History:  No known CAD, Echo 03/13/2013 (pending), Chest CT 2015 No PE Cardiac Risk Factors: History of Smoking, Hypertension and Lipids  Symptoms:  Chest Pain, DOE, Palpitations and SOB   Nuclear Pre-Procedure Caffeine/Decaff Intake:  None > 12 hrs NPO After: 2:00am   Lungs:  clear O2 Sat: 99% on room air. IV 0.9% NS with Angio Cath:  22g  IV Site: L Antecubital x 1, tolerated well IV Started by:  Irven Baltimore, RN  Chest Size (in):  42 Cup Size: D  Height: 5\' 2"  (1.575 m)  Weight:  192 lb (87.091 kg)  BMI:  Body mass index is 35.11 kg/(m^2). Tech Comments:  N/A    Nuclear Med Study 1 or 2 day study: 1 day  Stress Test Type:  Lexiscan  Reading MD: N/A  Order Authorizing Provider:  Fransico Him, MD  Resting Radionuclide: Technetium 2m Sestamibi  Resting Radionuclide Dose: 11.0 mCi   Stress Radionuclide:  Technetium 3m Sestamibi  Stress Radionuclide Dose: 33.0 mCi           Stress Protocol Rest HR: 63 Stress HR: 78  Rest BP: 179/107 Stress BP: 161/93  Exercise Time (min): n/a METS: n/a           Dose of Adenosine (mg):  n/a Dose of Lexiscan: 0.4 mg  Dose of Atropine (mg): n/a Dose of Dobutamine: n/a mcg/kg/min (at max HR)  Stress Test Technologist: Glade Lloyd, BS-ES  Nuclear Technologist:  Charlton Amor, CNMT     Rest Procedure:  Myocardial perfusion imaging was performed at rest 45 minutes following the intravenous administration of Technetium 74m Sestamibi. Rest ECG: SR, LVH, non-specific T wave abnormalities  Stress Procedure:  The patient received IV Lexiscan 0.4 mg over 15-seconds.  Technetium 66m Sestamibi injected at 30-seconds.  Quantitative spect  images were obtained after a 45 minute delay.  During the infusion of Lexiscan, the patient complained of slight SOB.  This resolved in recovery.  Stress ECG: No significant change from baseline ECG  QPS Raw Data Images:  There is interference from nuclear activity from structures below the diaphragm. This does not affect the ability to read the study. Stress Images:  Apical thinning and interference of the subdiaphragmatic activity in the mid and apical inferolateral walls.  Rest Images:  Apical thinning and interference of the subdiaphragmatic activity in the mid and apical inferolateral walls.  Subtraction (SDS):  No evidence of ischemia. Transient Ischemic Dilatation (Normal <1.22):  0.96 Lung/Heart Ratio (Normal <0.45):  0.43  Quantitative Gated Spect Images QGS EDV:  144 ml QGS ESV:  68 ml  Impression Exercise Capacity:  Lexiscan with no exercise. BP Response:  Normal blood pressure response. Clinical Symptoms:  There is dyspnea. ECG Impression:  No significant ST segment change suggestive of ischemia. Comparison with Prior Nuclear Study: No previous nuclear study performed  Overall Impression:  Normal stress nuclear study.  LV Ejection Fraction: 53%.  LV Wall Motion:  NL LV Function; NL Wall Motion  Dorothy Spark 04/13/2013

## 2013-04-13 NOTE — Telephone Encounter (Signed)
Pt states she only had issues while on her back. I told her I would have Dr Radford Pax verify that she still needs to be set up for titration.

## 2013-04-14 ENCOUNTER — Ambulatory Visit: Payer: Medicare Other | Admitting: Physical Therapy

## 2013-04-14 NOTE — Telephone Encounter (Signed)
She still needs to be set up for titratoin

## 2013-04-14 NOTE — Telephone Encounter (Signed)
Pt is aware. Form Given to [place] to fax over to [place]

## 2013-04-15 ENCOUNTER — Ambulatory Visit: Payer: Medicare Other | Attending: Orthopedic Surgery | Admitting: Physical Therapy

## 2013-04-15 DIAGNOSIS — Z96659 Presence of unspecified artificial knee joint: Secondary | ICD-10-CM | POA: Insufficient documentation

## 2013-04-15 DIAGNOSIS — M25669 Stiffness of unspecified knee, not elsewhere classified: Secondary | ICD-10-CM | POA: Insufficient documentation

## 2013-04-15 DIAGNOSIS — M25569 Pain in unspecified knee: Secondary | ICD-10-CM | POA: Insufficient documentation

## 2013-04-15 DIAGNOSIS — IMO0001 Reserved for inherently not codable concepts without codable children: Secondary | ICD-10-CM | POA: Insufficient documentation

## 2013-04-15 DIAGNOSIS — R609 Edema, unspecified: Secondary | ICD-10-CM | POA: Insufficient documentation

## 2013-04-20 ENCOUNTER — Ambulatory Visit (INDEPENDENT_AMBULATORY_CARE_PROVIDER_SITE_OTHER): Payer: Medicare Other | Admitting: Cardiology

## 2013-04-20 ENCOUNTER — Encounter: Payer: Self-pay | Admitting: Cardiology

## 2013-04-20 VITALS — BP 160/88 | HR 66 | Ht 63.0 in | Wt 190.0 lb

## 2013-04-20 DIAGNOSIS — I509 Heart failure, unspecified: Secondary | ICD-10-CM

## 2013-04-20 DIAGNOSIS — I5032 Chronic diastolic (congestive) heart failure: Secondary | ICD-10-CM

## 2013-04-20 DIAGNOSIS — I5030 Unspecified diastolic (congestive) heart failure: Secondary | ICD-10-CM | POA: Insufficient documentation

## 2013-04-20 DIAGNOSIS — I10 Essential (primary) hypertension: Secondary | ICD-10-CM

## 2013-04-20 DIAGNOSIS — R0602 Shortness of breath: Secondary | ICD-10-CM

## 2013-04-20 MED ORDER — VALSARTAN 160 MG PO TABS: 160.0000 mg | ORAL_TABLET | Freq: Every day | ORAL | Status: DC

## 2013-04-20 NOTE — Patient Instructions (Signed)
Your physician recommends that you continue on your current medications as directed. Please refer to the Current Medication list given to you today.  Your physician recommends that you schedule a follow-up appointment on 04/22/13 with the nurse at 11:00 to check your BP. Please make sure you take your BP medication at 7:00 am when you first wake up that day.   Your physician wants you to follow-up in: 6 months with Dr Mallie Snooks will receive a reminder letter in the mail two months in advance. If you don't receive a letter, please call our office to schedule the follow-up appointment.

## 2013-04-20 NOTE — Progress Notes (Signed)
Susan [name], Susan [name], Susan [name]  [id number] Phone: [phone] Fax:  [phone]  Date:  04/20/2013   ID:  Susan [name], DOB 1940/06/27, MRN [id number]  PCP:  [name], MD  Sleep Medicine:  Fransico Him, MD     History of Present Illness: Susan [name] is a 73 y.o. female with a history of OSA and HTN presented recemtly for evaluation of Chest pain. She was recently seen in the ER in late February with complaints of CP. The pain was a constant pain for over a week with no associated symptoms and no radiation. She also was complaining that she was SOB with a whistling noise in her chest. Chest CT angio showed no PE with ? Very mild interstitial edema. EKG showed NSR with ventricular bigeminy. Her CP was felt to be nonspecific and she was sent home with instructions to followup as an outpt. She saw her PCP the following week and she was referred her for further evaluation. When I last saw her she said that her chest pain had resolved. She said that she had been having some LE edema. She said that the SOB had continued and she had to stop to catch her breath. She occasionally notices her heart beating fast. She underwent stress myoview which showed no ischemia and normal LVF.  2D echo showed low normal LVF.  Her BNP was mildly elevated c/w diastolic CHF and Lasix was added.  She now presents today for followup.  Since starting the Lasix and restarting her BP med her breathing has improved.  She denies any SOB at this time unless she walks more than 10 minutes.  Her LE edema has improved and she has not had any further CP.   Wt Readings from Last 3 Encounters:  04/20/13 190 lb (86.183 kg)  04/13/13 192 lb (87.091 kg)  03/26/13 193 lb 12.8 oz (87.907 kg)     Past Medical History  Diagnosis Date  . Hyperlipidemia   . Pedal edema     none in last 6 months  . Spinal stenosis   . Varicose veins     "BLE" (07/29/2012)  . Primary hyperparathyroidism   . Thyroid disease   .  Incontinent of urine     wears pads  . Complication of anesthesia     "I have apnea" (07/29/2012)  . Family history of anesthesia complication     "some wake up during OR; some are hard to wake up; some both" (07/29/2012)  . OSA on CPAP     "dx'd just last week; doesn't wear mask" (07/29/2012)  . Arthritis     "legs, back" (07/29/2012)  . Osteoarthritis of right knee   . Umbilical hernia     "unrepaired" (07/29/2012)  . Chronic lower back pain   . History of stomach ulcers 1980's  . Anxiety   . Depression   . GERD (gastroesophageal reflux disease)   . Hypertension   . Chronic diastolic CHF (congestive heart failure)     Current Outpatient Prescriptions  Medication Sig Dispense Refill  . ALPRAZolam (XANAX) 0.25 MG tablet Take 0.25 mg by mouth at bedtime as needed for sleep.       Marland Kitchen buPROPion (WELLBUTRIN) 100 MG tablet Take 100 mg by mouth. 2 tablets in the am and 1 tablet in the evening      . furosemide (LASIX) 20 MG tablet Take two tablets daily for three days. Then take one tablet daily  33 tablet  11  . Multiple Vitamin (MULTIVITAMIN WITH MINERALS) TABS Take 1 tablet by mouth daily.      . naproxen (NAPROSYN) 500 MG tablet Take 500 mg by mouth as needed.       Marland Kitchen OVER THE COUNTER MEDICATION Take 2 tablets by mouth 2 (two) times daily. otc eye vitamin      . pantoprazole (PROTONIX) 20 MG tablet Take 1 tablet (20 mg total) by mouth daily.  30 tablet  0  . PREVNAR 13 SUSP injection Inject 0.5 mLs into the muscle.       . vitamin B-12 (CYANOCOBALAMIN) 100 MCG tablet Take 100 mcg by mouth daily.      . valsartan (DIOVAN) 160 MG tablet Take 1 tablet (160 mg total) by mouth daily.       No current facility-administered medications for this visit.    Allergies:    Allergies  Allergen Reactions  . Ace Inhibitors Cough    Pt unsure of this as an allergy  . Lipitor [Atorvastatin] Other (See Comments)    edema  . Simvastatin Other (See Comments)    Stiffness     Social History:   The patient  reports that she quit smoking about 25 years ago. Her smoking use included Cigarettes. She has a 30 pack-year smoking history. She has never used smokeless tobacco. She reports that she does not drink alcohol or use illicit drugs.   Family History:  The patient's family history includes Hypertension in her mother; Lung cancer in her father.   ROS:  Please see the history of present illness.      All other systems reviewed and negative.   PHYSICAL EXAM: VS:  BP 160/88  Pulse 66  Ht 5\' 3"  (1.6 m)  Wt 190 lb (86.183 kg)  BMI 33.67 kg/m2 Well nourished, well developed, in no acute distress HEENT: normal Neck: no JVD Cardiac:  normal S1, S2; RRR; no murmur Lungs:  clear to auscultation bilaterally, no wheezing, rhonchi or rales Abd: soft, nontender, no hepatomegaly Ext: trace gtedema Skin: warm and dry Neuro:  CNs 2-12 intact, no focal abnormalities noted   ASSESSMENT AND PLAN:  1.  Atypical Chest pain with no ischemia on nuclear stress test and low normal LVF on echo 2.  HTN - borderline control but did not take her Diovan this am - continue Diovan  - followup for nurse visit later this week for a BP check after she has taken her BP med early in the am 3.  SOB  Secondary to diastolic CHF -now on Lasix and SOB has resolved. - continue Lasix  Followup with me in 6 months  Signed, Fransico Him, MD 04/20/2013 11:12 AM

## 2013-04-26 ENCOUNTER — Ambulatory Visit (INDEPENDENT_AMBULATORY_CARE_PROVIDER_SITE_OTHER): Payer: Medicare Other | Admitting: *Deleted

## 2013-04-26 VITALS — BP 152/84 | HR 72 | Wt 194.8 lb

## 2013-04-26 DIAGNOSIS — I10 Essential (primary) hypertension: Secondary | ICD-10-CM

## 2013-04-26 MED ORDER — VALSARTAN 320 MG PO TABS: 320.0000 mg | ORAL_TABLET | Freq: Every day | ORAL | Status: DC

## 2013-04-26 NOTE — Patient Instructions (Signed)
Increase valsartan (diovan) to 320mg  daily. You can take 2 of your 160mg  tablets daily at the same time.   Your physician recommends that you schedule a follow-up appointment in: 1 week with the nurse for a BP check.

## 2013-04-26 NOTE — Progress Notes (Signed)
1.) Reason for visit: BP check  2.) Name of MD requesting visit: Dr Radford Pax  3.) H&P: per office visit 04/20/13 BP was 160/88 Diovan 160mg  ordered  4.) ROS related to problem: BP check today  5.) Assessment and plan per MD: Increase valsartan (Diovan ) to 320mg  daily, return for BP check in 1 week.   6.) Provider sign-of(MD statement):Agree with above.  Will increase Diovan due to persistently elevated BP 7.)

## 2013-04-26 NOTE — Progress Notes (Signed)
Agree with plan 

## 2013-04-28 ENCOUNTER — Telehealth: Payer: Self-pay | Admitting: Cardiology

## 2013-04-28 NOTE — Telephone Encounter (Signed)
New Message  Pt called to resch BP check //Resch appt to 05/04/2013 at 10 am

## 2013-04-28 NOTE — Telephone Encounter (Signed)
Noted  

## 2013-05-03 ENCOUNTER — Encounter: Payer: Self-pay | Admitting: Cardiology

## 2013-05-04 ENCOUNTER — Ambulatory Visit (INDEPENDENT_AMBULATORY_CARE_PROVIDER_SITE_OTHER): Payer: Medicare Other | Admitting: *Deleted

## 2013-05-04 VITALS — BP 138/90 | HR 60 | Wt 191.1 lb

## 2013-05-04 DIAGNOSIS — I10 Essential (primary) hypertension: Secondary | ICD-10-CM

## 2013-05-04 MED ORDER — PANTOPRAZOLE SODIUM 20 MG PO TBEC: 20.0000 mg | DELAYED_RELEASE_TABLET | Freq: Every day | ORAL | Status: DC

## 2013-05-04 NOTE — Progress Notes (Signed)
1.) Reason for visit: BP check due to increase of Diovan 160 to 320 mg  2.) Name of MD requesting visit: Dr. Fransico Him  3.) H&P: Hx of hypertension, GERD, CHF  4.) ROS related to problem: No c/o; requests refill on Protonix (sent to pharmacy)  Assessment and plan per MD: For BP check; states she has been taking her Diovan 320 mg daily as instructed.  Does not take BP at home. Advised to have checked at the drug store or fire department on a regular basis and to report if elevated. Gave perimeters of 120-130/ 80-90..  Advised to continue same dose unless Dr. Radford Pax advises changes.  5.) Provider sign-of(MD statement): Above assessment reviewed.  BP improves on higher dose of Diovan.  DBP borderline elevated.  Agree with patient to continue checking BP 1-2 times weekly and report if it goes higher that the above stated parameters

## 2013-05-06 ENCOUNTER — Telehealth: Payer: Self-pay | Admitting: Cardiology

## 2013-05-06 NOTE — Telephone Encounter (Signed)
Please let patient know that she had successful CPAP titration and set up OV with me in 10 weeks 

## 2013-05-07 NOTE — Telephone Encounter (Signed)
Pt is aware. She is set up to see Dr Radford Pax on July 8th.

## 2013-05-13 ENCOUNTER — Telehealth: Payer: Self-pay | Admitting: Cardiology

## 2013-05-13 NOTE — Telephone Encounter (Signed)
TO Dr Radford Pax to advise.

## 2013-05-13 NOTE — Telephone Encounter (Signed)
New message ° ° °Patient calling for sleep study results °

## 2013-05-14 ENCOUNTER — Telehealth: Payer: Self-pay | Admitting: Cardiology

## 2013-05-14 NOTE — Telephone Encounter (Signed)
See telephone encounter from 4/30

## 2013-05-14 NOTE — Telephone Encounter (Signed)
Called and spoke to pt again regarding study, she is aware she talked to me beginning of the month and is set with [facility]

## 2013-05-14 NOTE — Telephone Encounter (Signed)
Pt was given results. [facility] is going to be taking care of pt and they are going to call her today to set her up with her Machine.

## 2013-05-14 NOTE — Telephone Encounter (Signed)
Follow up     Want sleep study results

## 2013-05-14 NOTE — Telephone Encounter (Signed)
Patient would like results of sleep study. Please call and advise.

## 2013-05-17 ENCOUNTER — Other Ambulatory Visit: Payer: Self-pay

## 2013-05-17 DIAGNOSIS — Z1231 Encounter for screening mammogram for malignant neoplasm of breast: Secondary | ICD-10-CM

## 2013-05-21 ENCOUNTER — Ambulatory Visit
Admission: RE | Admit: 2013-05-21 | Discharge: 2013-05-21 | Disposition: A | Discharge status: Home / Self Care | Payer: Medicare Other | Source: Ambulatory Visit

## 2013-05-21 DIAGNOSIS — Z1231 Encounter for screening mammogram for malignant neoplasm of breast: Secondary | ICD-10-CM

## 2013-06-09 ENCOUNTER — Encounter: Payer: Self-pay | Admitting: Cardiology

## 2013-07-05 ENCOUNTER — Other Ambulatory Visit: Payer: Self-pay | Admitting: Cardiology

## 2013-07-14 ENCOUNTER — Ambulatory Visit: Payer: Medicare Other | Admitting: Cardiology

## 2013-07-26 ENCOUNTER — Encounter (HOSPITAL_BASED_OUTPATIENT_CLINIC_OR_DEPARTMENT_OTHER): Payer: Self-pay | Admitting: Emergency Medicine

## 2013-07-26 ENCOUNTER — Emergency Department (HOSPITAL_BASED_OUTPATIENT_CLINIC_OR_DEPARTMENT_OTHER)
Admission: EM | Admit: 2013-07-26 | Discharge: 2013-07-26 | Disposition: A | Discharge status: Home / Self Care | Payer: Medicare Other | Attending: Emergency Medicine | Admitting: Emergency Medicine

## 2013-07-26 DIAGNOSIS — Z862 Personal history of diseases of the blood and blood-forming organs and certain disorders involving the immune mechanism: Secondary | ICD-10-CM | POA: Insufficient documentation

## 2013-07-26 DIAGNOSIS — F411 Generalized anxiety disorder: Secondary | ICD-10-CM | POA: Insufficient documentation

## 2013-07-26 DIAGNOSIS — Z9981 Dependence on supplemental oxygen: Secondary | ICD-10-CM | POA: Insufficient documentation

## 2013-07-26 DIAGNOSIS — Z8639 Personal history of other endocrine, nutritional and metabolic disease: Secondary | ICD-10-CM | POA: Insufficient documentation

## 2013-07-26 DIAGNOSIS — G8929 Other chronic pain: Secondary | ICD-10-CM | POA: Insufficient documentation

## 2013-07-26 DIAGNOSIS — I5032 Chronic diastolic (congestive) heart failure: Secondary | ICD-10-CM | POA: Insufficient documentation

## 2013-07-26 DIAGNOSIS — F329 Major depressive disorder, single episode, unspecified: Secondary | ICD-10-CM | POA: Insufficient documentation

## 2013-07-26 DIAGNOSIS — K219 Gastro-esophageal reflux disease without esophagitis: Secondary | ICD-10-CM | POA: Insufficient documentation

## 2013-07-26 DIAGNOSIS — I10 Essential (primary) hypertension: Secondary | ICD-10-CM | POA: Insufficient documentation

## 2013-07-26 DIAGNOSIS — L03115 Cellulitis of right lower limb: Secondary | ICD-10-CM

## 2013-07-26 DIAGNOSIS — G4733 Obstructive sleep apnea (adult) (pediatric): Secondary | ICD-10-CM | POA: Insufficient documentation

## 2013-07-26 DIAGNOSIS — B354 Tinea corporis: Secondary | ICD-10-CM

## 2013-07-26 DIAGNOSIS — Z79899 Other long term (current) drug therapy: Secondary | ICD-10-CM | POA: Insufficient documentation

## 2013-07-26 DIAGNOSIS — IMO0002 Reserved for concepts with insufficient information to code with codable children: Secondary | ICD-10-CM | POA: Insufficient documentation

## 2013-07-26 DIAGNOSIS — L02419 Cutaneous abscess of limb, unspecified: Secondary | ICD-10-CM | POA: Insufficient documentation

## 2013-07-26 DIAGNOSIS — L03119 Cellulitis of unspecified part of limb: Principal | ICD-10-CM

## 2013-07-26 DIAGNOSIS — Z87891 Personal history of nicotine dependence: Secondary | ICD-10-CM | POA: Insufficient documentation

## 2013-07-26 DIAGNOSIS — F3289 Other specified depressive episodes: Secondary | ICD-10-CM | POA: Insufficient documentation

## 2013-07-26 DIAGNOSIS — R21 Rash and other nonspecific skin eruption: Secondary | ICD-10-CM | POA: Insufficient documentation

## 2013-07-26 DIAGNOSIS — M1710 Unilateral primary osteoarthritis, unspecified knee: Secondary | ICD-10-CM | POA: Insufficient documentation

## 2013-07-26 LAB — BASIC METABOLIC PANEL
Anion gap: 11 (ref 5–15)
BUN: 24 mg/dL — ABNORMAL HIGH (ref 6–23)
CALCIUM: 10.4 mg/dL (ref 8.4–10.5)
CHLORIDE: 104 meq/L (ref 96–112)
CO2: 28 mEq/L (ref 19–32)
Creatinine, Ser: 1.3 mg/dL — ABNORMAL HIGH (ref 0.50–1.10)
GFR calc Af Amer: 45 mL/min — ABNORMAL LOW (ref >90)
GFR calc non Af Amer: 39 mL/min — ABNORMAL LOW (ref >90)
GLUCOSE: 125 mg/dL — AB (ref 70–99)
Potassium: 4.2 mEq/L (ref 3.7–5.3)
Sodium: 143 mEq/L (ref 137–147)

## 2013-07-26 LAB — CBC WITH DIFFERENTIAL/PLATELET
Basophils Absolute: 0.1 10*3/uL (ref 0.0–0.1)
Basophils Relative: 1 % (ref 0–1)
EOS PCT: 8 % — AB (ref 0–5)
Eosinophils Absolute: 0.3 10*3/uL (ref 0.0–0.7)
HCT: 34 % — ABNORMAL LOW (ref 36.0–46.0)
Hemoglobin: 11.1 g/dL — ABNORMAL LOW (ref 12.0–15.0)
LYMPHS ABS: 1.4 10*3/uL (ref 0.7–4.0)
LYMPHS PCT: 34 % (ref 12–46)
MCH: 30 pg (ref 26.0–34.0)
MCHC: 32.6 g/dL (ref 30.0–36.0)
MCV: 91.9 fL (ref 78.0–100.0)
Monocytes Absolute: 0.5 10*3/uL (ref 0.1–1.0)
Monocytes Relative: 11 % (ref 3–12)
NEUTROS ABS: 1.9 10*3/uL (ref 1.7–7.7)
Neutrophils Relative %: 47 % (ref 43–77)
PLATELETS: 271 10*3/uL (ref 150–400)
RBC: 3.7 MIL/uL — AB (ref 3.87–5.11)
RDW: 14.7 % (ref 11.5–15.5)
WBC: 4.1 10*3/uL (ref 4.0–10.5)

## 2013-07-26 LAB — D-DIMER, QUANTITATIVE: D-Dimer, Quant: 0.87 ug/mL-FEU — ABNORMAL HIGH (ref 0.00–0.48)

## 2013-07-26 LAB — SEDIMENTATION RATE: Sed Rate: 21 mm/hr (ref 0–22)

## 2013-07-26 MED ORDER — TERBINAFINE HCL 250 MG PO TABS: 250.0000 mg | ORAL_TABLET | Freq: Every day | ORAL | Status: DC

## 2013-07-26 MED ORDER — OXYCODONE-ACETAMINOPHEN 5-325 MG PO TABS
1.0000 | ORAL_TABLET | Freq: Once | ORAL | Status: AC
  Administered 2013-07-26: 1 via ORAL
  Filled 2013-07-26: qty 1

## 2013-07-26 MED ORDER — CEPHALEXIN 250 MG PO CAPS
1000.0000 mg | ORAL_CAPSULE | Freq: Once | ORAL | Status: AC
  Administered 2013-07-26: 1000 mg via ORAL
  Filled 2013-07-26: qty 4

## 2013-07-26 MED ORDER — CEPHALEXIN 500 MG PO CAPS: 500.0000 mg | ORAL_CAPSULE | Freq: Four times a day (QID) | ORAL | Status: DC

## 2013-07-26 MED ORDER — OXYCODONE-ACETAMINOPHEN 5-325 MG PO TABS: 1.0000 | ORAL_TABLET | ORAL | Status: DC | PRN

## 2013-07-26 NOTE — Discharge Instructions (Signed)
Cellulitis Cellulitis is an infection of the skin and the tissue beneath it. The infected area is usually red and tender. Cellulitis occurs most often in the arms and lower legs.  CAUSES  Cellulitis is caused by bacteria that enter the skin through cracks or cuts in the skin. The most common types of bacteria that cause cellulitis are Staphylococcus and Streptococcus. SYMPTOMS   Redness and warmth.  Swelling.  Tenderness or pain.  Fever. DIAGNOSIS  Your caregiver can usually determine what is wrong based on a physical exam. Blood tests may also be done. TREATMENT  Treatment usually involves taking an antibiotic medicine. HOME CARE INSTRUCTIONS   Take your antibiotics as directed. Finish them even if you start to feel better.  Keep the infected arm or leg elevated to reduce swelling.  Apply a warm cloth to the affected area up to 4 times per day to relieve pain.  Only take over-the-counter or prescription medicines for pain, discomfort, or fever as directed by your caregiver.  Keep all follow-up appointments as directed by your caregiver. SEEK MEDICAL CARE IF:   You notice red streaks coming from the infected area.  Your red area gets larger or turns dark in color.  Your bone or joint underneath the infected area becomes painful after the skin has healed.  Your infection returns in the same area or another area.  You notice a swollen bump in the infected area.  You develop new symptoms. SEEK IMMEDIATE MEDICAL CARE IF:   You have a fever.  You feel very sleepy.  You develop vomiting or diarrhea.  You have a general ill feeling (malaise) with muscle aches and pains. MAKE SURE YOU:   Understand these instructions.  Will watch your condition.  Will get help right away if you are not doing well or get worse. Document Released: 10/03/2004 Document Revised: 06/25/2011 Document Reviewed: 03/11/2011 [facility] Patient Information 2015 [place], [place]. This information is  not intended to replace advice given to you by your health care provider. Make sure you discuss any questions you have with your health care provider.  Body Ringworm Ringworm (tinea corporis) is a fungal infection of the skin on the body. This infection is not caused by worms, but is actually caused by a fungus. Fungus normally lives on the top of your skin and can be useful. However, in the case of ringworms, the fungus grows out of control and causes a skin infection. It can involve any area of skin on the body and can spread easily from one person to another (contagious). Ringworm is a common problem for children, but it can affect adults as well. Ringworm is also often found in athletes, especially wrestlers who share equipment and mats.  CAUSES  Ringworm of the body is caused by a fungus called dermatophyte. It can spread by:  Touchingother people who are infected.  Touchinginfected pets.  Touching or sharingobjects that have been in contact with the infected person or pet (hats, combs, towels, clothing, sports equipment). SYMPTOMS   Itchy, raised red spots and bumps on the skin.  Ring-shaped rash.  Redness near the border of the rash with a clear center.  Dry and scaly skin on or around the rash. Not every person develops a ring-shaped rash. Some develop only the red, scaly patches. DIAGNOSIS  Most often, ringworm can be diagnosed by performing a skin exam. Your caregiver may choose to take a skin scraping from the affected area. The sample will be examined under the microscope to  see if the fungus is present.  TREATMENT  Body ringworm may be treated with a topical antifungal cream or ointment. Sometimes, an antifungal shampoo that can be used on your body is prescribed. You may be prescribed antifungal medicines to take by mouth if your ringworm is severe, keeps coming back, or lasts a long time.  HOME CARE INSTRUCTIONS   Only take over-the-counter or prescription medicines as  directed by your caregiver.  Wash the infected area and dry it completely before applying yourcream or ointment.  When using antifungal shampoo to treat the ringworm, leave the shampoo on the body for 3-5 minutes before rinsing.   Wear loose clothing to stop clothes from rubbing and irritating the rash.  Wash or change your bed sheets every night while you have the rash.  Have your pet treated by your veterinarian if it has the same infection. To prevent ringworm:   Practice good hygiene.  Wear sandals or shoes in public places and showers.  Do not share personal items with others.  Avoid touching red patches of skin on other people.  Avoid touching pets that have bald spots or wash your hands after doing so. SEEK MEDICAL CARE IF:   Your rash continues to spread after 7 days of treatment.  Your rash is not gone in 4 weeks.  The area around your rash becomes red, warm, tender, and swollen. Document Released: 12/22/1999 Document Revised: 09/18/2011 Document Reviewed: 07/08/2011 [facility] Patient Information 2015 [place], [place]. This information is not intended to replace advice given to you by your health care provider. Make sure you discuss any questions you have with your health care provider.  Cephalexin tablets or capsules What is this medicine? CEPHALEXIN (sef a LEX in) is a cephalosporin antibiotic. It is used to treat certain kinds of bacterial infections It will not work for colds, flu, or other viral infections. This medicine may be used for other purposes; ask your health care provider or pharmacist if you have questions. COMMON BRAND NAME(S): Biocef, Keflex, Keftab What should I tell my health care provider before I take this medicine? They need to know if you have any of these conditions: -kidney disease -stomach or intestine problems, especially colitis -an unusual or allergic reaction to cephalexin, other cephalosporins, penicillins, other antibiotics,  medicines, foods, dyes or preservatives -pregnant or trying to get pregnant -breast-feeding How should I use this medicine? Take this medicine by mouth with a full glass of water. Follow the directions on the prescription label. This medicine can be taken with or without food. Take your medicine at regular intervals. Do not take your medicine more often than directed. Take all of your medicine as directed even if you think you are better. Do not skip doses or stop your medicine early. Talk to your pediatrician regarding the use of this medicine in children. While this drug may be prescribed for selected conditions, precautions do apply. Overdosage: If you think you have taken too much of this medicine contact a poison control center or emergency room at once. NOTE: This medicine is only for you. Do not share this medicine with others. What if I miss a dose? If you miss a dose, take it as soon as you can. If it is almost time for your next dose, take only that dose. Do not take double or extra doses. There should be at least 4 to 6 hours between doses. What may interact with this medicine? -probenecid -some other antibiotics This list may not describe all  possible interactions. Give your health care provider a list of all the medicines, herbs, non-prescription drugs, or dietary supplements you use. Also tell them if you smoke, drink alcohol, or use illegal drugs. Some items may interact with your medicine. What should I watch for while using this medicine? Tell your doctor or health care professional if your symptoms do not begin to improve in a few days. Do not treat diarrhea with over the counter products. Contact your doctor if you have diarrhea that lasts more than 2 days or if it is severe and watery. If you have diabetes, you may get a false-positive result for sugar in your urine. Check with your doctor or health care professional. What side effects may I notice from receiving this  medicine? Side effects that you should report to your doctor or health care professional as soon as possible: -allergic reactions like skin rash, itching or hives, swelling of the face, lips, or tongue -breathing problems -pain or trouble passing urine -redness, blistering, peeling or loosening of the skin, including inside the mouth -severe or watery diarrhea -unusually weak or tired -yellowing of the eyes, skin Side effects that usually do not require medical attention (report to your doctor or health care professional if they continue or are bothersome): -gas or heartburn -genital or anal irritation -headache -joint or muscle pain -nausea, vomiting This list may not describe all possible side effects. Call your doctor for medical advice about side effects. You may report side effects to FDA at 1-800-FDA-1088. Where should I keep my medicine? Keep out of the reach of children. Store at room temperature between 59 and 86 degrees F (15 and 30 degrees C). Throw away any unused medicine after the expiration date. NOTE: This sheet is a summary. It may not cover all possible information. If you have questions about this medicine, talk to your doctor, pharmacist, or health care provider.  2015, [employer]. (2007-03-30 17:09:13)  Terbinafine tablets What is this medicine? TERBINAFINE (TER bin a feen) is an antifungal medicine. It is used to treat certain kinds of fungal or yeast infections. This medicine may be used for other purposes; ask your health care provider or pharmacist if you have questions. COMMON BRAND NAME(S): Lamisil, Terbinex What should I tell my health care provider before I take this medicine? They need to know if you have any of these conditions: -drink alcoholic beverages -kidney disease -liver disease -an unusual or allergic reaction to terbinafine, other medicines, foods, dyes, or preservatives -pregnant or trying to get pregnant -breast-feeding How  should I use this medicine? Take this medicine by mouth with a full glass of water. Follow the directions on the prescription label. You can take this medicine with food or on an empty stomach. Take your medicine at regular intervals. Do not take your medicine more often than directed. Do not skip doses or stop your medicine early even if you feel better. Do not stop taking except on your doctor's advice. Talk to your pediatrician regarding the use of this medicine in children. Special care may be needed. Overdosage: If you think you have taken too much of this medicine contact a poison control center or emergency room at once. NOTE: This medicine is only for you. Do not share this medicine with others. What if I miss a dose? If you miss a dose, take it as soon as you can. If it is almost time for your next dose, take only that dose. Do not take double or extra doses.  What may interact with this medicine? Do not take this medicine with any of the following medications: -thioridazine This medicine may also interact with the following medications: -beta-blockers -caffeine -cimetidine -cyclosporine -medicines for depression, anxiety, or psychotic disturbances -medicines for fungal infections like fluconazole and ketoconazole -medicines for irregular heartbeat like amiodarone, flecainide and propafenone -rifampin -warfarin This list may not describe all possible interactions. Give your health care provider a list of all the medicines, herbs, non-prescription drugs, or dietary supplements you use. Also tell them if you smoke, drink alcohol, or use illegal drugs. Some items may interact with your medicine. What should I watch for while using this medicine? Visit your doctor or health care provider regularly. Tell your doctor right away if you have nausea or vomiting, loss of appetite, stomach pain on your right upper side, yellow skin, dark urine, light stools, or are over tired. Some fungal  infections need many weeks or months of treatment to cure. If you are taking this medicine for a long time, you will need to have important blood work done. What side effects may I notice from receiving this medicine? Side effects that you should report to your doctor or health care professional as soon as possible: -allergic reactions like skin rash or hives, swelling of the face, lips, or tongue -changes in vision -dark urine -fever or infection -general ill feeling or flu-like symptoms -light-colored stools -loss of appetite, nausea -redness, blistering, peeling or loosening of the skin, including inside the mouth -right upper belly pain -unusually weak or tired -yellowing of the eyes or skin Side effects that usually do not require medical attention (report to your doctor or health care professional if they continue or are bothersome): -changes in taste -diarrhea -hair loss -muscle or joint pain -stomach gas -stomach upset This list may not describe all possible side effects. Call your doctor for medical advice about side effects. You may report side effects to FDA at 1-800-FDA-1088. Where should I keep my medicine? Keep out of the reach of children. Store at room temperature below 25 degrees C (77 degrees F). Protect from light. Throw away any unused medicine after the expiration date. NOTE: This sheet is a summary. It may not cover all possible information. If you have questions about this medicine, talk to your doctor, pharmacist, or health care provider.  2015, [employer]. (2007-03-06 16:28:07)  Acetaminophen; Oxycodone tablets What is this medicine? ACETAMINOPHEN; OXYCODONE (a set a MEE noe fen; ox i KOE done) is a pain reliever. It is used to treat mild to moderate pain. This medicine may be used for other purposes; ask your health care provider or pharmacist if you have questions. COMMON BRAND NAME(S): Endocet, Magnacet, Narvox, Percocet, Perloxx, Primalev,  Primlev, Roxicet, Xolox What should I tell my health care provider before I take this medicine? They need to know if you have any of these conditions: -brain tumor -Crohn's disease, inflammatory bowel disease, or ulcerative colitis -drug abuse or addiction -head injury -heart or circulation problems -if you often drink alcohol -kidney disease or problems going to the bathroom -liver disease -lung disease, asthma, or breathing problems -an unusual or allergic reaction to acetaminophen, oxycodone, other opioid analgesics, other medicines, foods, dyes, or preservatives -pregnant or trying to get pregnant -breast-feeding How should I use this medicine? Take this medicine by mouth with a full glass of water. Follow the directions on the prescription label. Take your medicine at regular intervals. Do not take your medicine more often than directed. Talk to  your pediatrician regarding the use of this medicine in children. Special care may be needed. Patients over 47 years old may have a stronger reaction and need a smaller dose. Overdosage: If you think you have taken too much of this medicine contact a poison control center or emergency room at once. NOTE: This medicine is only for you. Do not share this medicine with others. What if I miss a dose? If you miss a dose, take it as soon as you can. If it is almost time for your next dose, take only that dose. Do not take double or extra doses. What may interact with this medicine? -alcohol -antihistamines -barbiturates like amobarbital, butalbital, butabarbital, methohexital, pentobarbital, phenobarbital, thiopental, and secobarbital -benztropine -drugs for bladder problems like solifenacin, trospium, oxybutynin, tolterodine, hyoscyamine, and methscopolamine -drugs for breathing problems like ipratropium and tiotropium -drugs for certain stomach or intestine problems like propantheline, homatropine methylbromide, glycopyrrolate, atropine,  belladonna, and dicyclomine -general anesthetics like etomidate, ketamine, nitrous oxide, propofol, desflurane, enflurane, halothane, isoflurane, and sevoflurane -medicines for depression, anxiety, or psychotic disturbances -medicines for sleep -muscle relaxants -naltrexone -narcotic medicines (opiates) for pain -phenothiazines like perphenazine, thioridazine, chlorpromazine, mesoridazine, fluphenazine, prochlorperazine, promazine, and trifluoperazine -scopolamine -tramadol -trihexyphenidyl This list may not describe all possible interactions. Give your health care provider a list of all the medicines, herbs, non-prescription drugs, or dietary supplements you use. Also tell them if you smoke, drink alcohol, or use illegal drugs. Some items may interact with your medicine. What should I watch for while using this medicine? Tell your doctor or health care professional if your pain does not go away, if it gets worse, or if you have new or a different type of pain. You may develop tolerance to the medicine. Tolerance means that you will need a higher dose of the medication for pain relief. Tolerance is normal and is expected if you take this medicine for a long time. Do not suddenly stop taking your medicine because you may develop a severe reaction. Your body becomes used to the medicine. This does NOT mean you are addicted. Addiction is a behavior related to getting and using a drug for a non-medical reason. If you have pain, you have a medical reason to take pain medicine. Your doctor will tell you how much medicine to take. If your doctor wants you to stop the medicine, the dose will be slowly lowered over time to avoid any side effects. You may get drowsy or dizzy. Do not drive, use machinery, or do anything that needs mental alertness until you know how this medicine affects you. Do not stand or sit up quickly, especially if you are an older patient. This reduces the risk of dizzy or fainting  spells. Alcohol may interfere with the effect of this medicine. Avoid alcoholic drinks. There are different types of narcotic medicines (opiates) for pain. If you take more than one type at the same time, you may have more side effects. Give your health care provider a list of all medicines you use. Your doctor will tell you how much medicine to take. Do not take more medicine than directed. Call emergency for help if you have problems breathing. The medicine will cause constipation. Try to have a bowel movement at least every 2 to 3 days. If you do not have a bowel movement for 3 days, call your doctor or health care professional. Do not take Tylenol (acetaminophen) or medicines that have acetaminophen with this medicine. Too much acetaminophen can be very dangerous. Many  nonprescription medicines contain acetaminophen. Always read the labels carefully to avoid taking more acetaminophen. What side effects may I notice from receiving this medicine? Side effects that you should report to your doctor or health care professional as soon as possible: -allergic reactions like skin rash, itching or hives, swelling of the face, lips, or tongue -breathing difficulties, wheezing -confusion -light headedness or fainting spells -severe stomach pain -unusually weak or tired -yellowing of the skin or the whites of the eyes Side effects that usually do not require medical attention (report to your doctor or health care professional if they continue or are bothersome): -dizziness -drowsiness -nausea -vomiting This list may not describe all possible side effects. Call your doctor for medical advice about side effects. You may report side effects to FDA at 1-800-FDA-1088. Where should I keep my medicine? Keep out of the reach of children. This medicine can be abused. Keep your medicine in a safe place to protect it from theft. Do not share this medicine with anyone. Selling or giving away this medicine is  dangerous and against the law. Store at room temperature between 20 and 25 degrees C (68 and 77 degrees F). Keep container tightly closed. Protect from light. This medicine may cause accidental overdose and death if it is taken by other adults, children, or pets. Flush any unused medicine down the toilet to reduce the chance of harm. Do not use the medicine after the expiration date. NOTE: This sheet is a summary. It may not cover all possible information. If you have questions about this medicine, talk to your doctor, pharmacist, or health care provider.  2015, [employer]. (2012-08-17 13:17:35)

## 2013-07-26 NOTE — ED Provider Notes (Signed)
CSN: [id number]     Arrival date & time 07/26/13  0114 History   First MD Initiated Contact with Patient 07/26/13 0132     Chief Complaint  Patient presents with  . Leg Pain     (Consider location/radiation/quality/duration/timing/severity/associated sxs/prior Treatment) Patient is a 73 y.o. female presenting with leg pain. The history is provided by the patient.  Leg Pain She has been having pain and a rash in her right lower leg for the last month. Seems to be spreading proximally. Pain is both sharp and dull, and much worse with any kind of palpation. Address, she rates pain at 4/10 but it is up to 10/10 when it is touched. She's noted some scaling of her skin. She saw her PCP and her podiatrist who both felt that it was eczema. She is also noted some spots on her neck and both arms which she is worried it is the same condition but these are itchy and not painful. There is no associated fever, chills, sweats. She denies chest pain or dyspnea. She has been using some topical creams with no benefit.  Past Medical History  Diagnosis Date  . Hyperlipidemia   . Pedal edema     none in last 6 months  . Spinal stenosis   . Varicose veins     "BLE" (07/29/2012)  . Primary hyperparathyroidism   . Thyroid disease   . Incontinent of urine     wears pads  . Complication of anesthesia     "I have apnea" (07/29/2012)  . Family history of anesthesia complication     "some wake up during OR; some are hard to wake up; some both" (07/29/2012)  . OSA on CPAP     "dx'd just last week; doesn't wear mask" (07/29/2012)  . Arthritis     "legs, back" (07/29/2012)  . Osteoarthritis of right knee   . Umbilical hernia     "unrepaired" (07/29/2012)  . Chronic lower back pain   . History of stomach ulcers 1980's  . Anxiety   . Depression   . GERD (gastroesophageal reflux disease)   . Hypertension   . Chronic diastolic CHF (congestive heart failure)    Past Surgical History  Procedure Laterality Date  .  Iud removal  1980's  . Parathyroidectomy N/A 06/16/2012    Procedure: NECK EXPLORATION AND LEFT SUPERIOR PARATHYROIDECTOMY;  Surgeon: Earnstine Regal, MD;  Location: [facility];  Service: General;  Laterality: N/A;  . Colonoscopy    . 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 Cholecystectomy  ~ 2010  . Total knee arthroplasty Right 07/29/2012    Procedure: TOTAL KNEE ARTHROPLASTY;  Surgeon: Ninetta Lights, MD;  Location: [facility];  Service: Orthopedics;  Laterality: Right;   Family History  Problem Relation Age of Onset  . Hypertension Mother     deceased  . Lung cancer Father     deceased   History  Substance Use Topics  . Smoking status: Former Smoker -- 1.00 packs/day for 30 years    Types: Cigarettes    Quit date: 01/08/1988  . Smokeless tobacco: Never Used  . Alcohol Use: No   OB History   Grav Para Term Preterm Abortions TAB SAB Ect Mult Living                 Review of Systems  All other systems reviewed and are negative.     Allergies  Ace inhibitors; Lipitor; and Simvastatin  Home Medications   Prior to  Admission medications   Medication Sig Start Date End Date Taking? Authorizing Provider  ALPRAZolam Duanne Moron) 0.25 MG tablet Take 0.25 mg by mouth at bedtime as needed for sleep.     Historical Provider, MD  buPROPion (WELLBUTRIN) 100 MG tablet Take 100 mg by mouth. 2 tablets in the am and 1 tablet in the evening    Historical Provider, MD  furosemide (LASIX) 20 MG tablet Take two tablets daily for three days. Then take one tablet daily 03/29/13   Sueanne Margarita, MD  Multiple Vitamin (MULTIVITAMIN WITH MINERALS) TABS Take 1 tablet by mouth daily.    Historical Provider, MD  naproxen (NAPROSYN) 500 MG tablet Take 500 mg by mouth as needed.  03/31/13   Historical Provider, MD  OVER THE COUNTER MEDICATION Take 2 tablets by mouth 2 (two) times daily. otc eye vitamin    Historical Provider, MD  pantoprazole (PROTONIX) 20 MG tablet Take 1 tablet (20 mg total) by mouth daily.  05/04/13   Sueanne Margarita, MD  PREVNAR 13 SUSP injection Inject 0.5 mLs into the muscle.  03/30/13   Historical Provider, MD  valsartan (DIOVAN) 320 MG tablet TAKE 1 TABLET BY MOUTH DAILY    Traci R Turner, MD  vitamin B-12 (CYANOCOBALAMIN) 100 MCG tablet Take 100 mcg by mouth daily.    Historical Provider, MD   BP 166/74  Pulse 58  Temp(Src) 97.6 F (36.4 C) (Oral)  Resp 20  Ht 5\' 5"  (1.651 m)  Wt 198 lb (89.812 kg)  BMI 32.95 kg/m2  SpO2 100% Physical Exam  Nursing note and vitals reviewed.  73 year old female, resting comfortably and in no acute distress. Vital signs are significant for hypertension with blood pressure 166/74, bradycardia with heart rate 58. Oxygen saturation is 100%, which is normal. Head is normocephalic and atraumatic. PERRLA, EOMI. Oropharynx is clear. Neck is nontender and supple without adenopathy or JVD. Back is nontender and there is no CVA tenderness. Lungs are clear without rales, wheezes, or rhonchi. Chest is nontender. Heart has regular rate and rhythm without murmur. Abdomen is soft, flat, nontender without masses or hepatosplenomegaly and peristalsis is normoactive. Extremities have 1+ edema with moderate changes of venous stasis. Right leg has some areas of desquamation and increased discoloration and is warm to touch as well as very tender. Distal neurovascular exam is intact with strong dorsalis pedis pulse and normal sensation as well as normal capillary refill. There is no objective difference in calf circumference. Skin is warm and dry. There are areas of hyperpigmentation with central clearing on the left side of neck, and both upper arms just proximal to the antecubital space. Neurologic: Mental status is normal, cranial nerves are intact, there are no motor or sensory deficits.  ED Course  Procedures (including critical care time) Labs Review Results for orders placed during the hospital encounter of 07/26/13  CBC WITH DIFFERENTIAL       Result Value Ref Range   WBC 4.1  4.0 - 10.5 K/uL   RBC 3.70 (*) 3.87 - 5.11 MIL/uL   Hemoglobin 11.1 (*) 12.0 - 15.0 g/dL   HCT 34.0 (*) 36.0 - 46.0 %   MCV 91.9  78.0 - 100.0 fL   MCH 30.0  26.0 - 34.0 pg   MCHC 32.6  30.0 - 36.0 g/dL   RDW 14.7  11.5 - 15.5 %   Platelets 271  150 - 400 K/uL   Neutrophils Relative % 47  43 - 77 %  Neutro Abs 1.9  1.7 - 7.7 K/uL   Lymphocytes Relative 34  12 - 46 %   Lymphs Abs 1.4  0.7 - 4.0 K/uL   Monocytes Relative 11  3 - 12 %   Monocytes Absolute 0.5  0.1 - 1.0 K/uL   Eosinophils Relative 8 (*) 0 - 5 %   Eosinophils Absolute 0.3  0.0 - 0.7 K/uL   Basophils Relative 1  0 - 1 %   Basophils Absolute 0.1  0.0 - 0.1 K/uL  BASIC METABOLIC PANEL      Result Value Ref Range   Sodium 143  137 - 147 mEq/L   Potassium 4.2  3.7 - 5.3 mEq/L   Chloride 104  96 - 112 mEq/L   CO2 28  19 - 32 mEq/L   Glucose, Bld 125 (*) 70 - 99 mg/dL   BUN 24 (*) 6 - 23 mg/dL   Creatinine, Ser 1.30 (*) 0.50 - 1.10 mg/dL   Calcium 10.4  8.4 - 10.5 mg/dL   GFR calc non Af Amer 39 (*) >90 mL/min   GFR calc Af Amer 45 (*) >90 mL/min   Anion gap 11  5 - 15  SEDIMENTATION RATE      Result Value Ref Range   Sed Rate 21  0 - 22 mm/hr  D-DIMER, QUANTITATIVE      Result Value Ref Range   D-Dimer, Quant 0.87 (*) 0.00 - 0.48 ug/mL-FEU   MDM   Final diagnoses:  Cellulitis of right lower leg  Tinea corporis    Leg pain and desquamation which appears to be due to cellulitis. CBC and sedimentation rate will be checked. The arm and neck lesions have the appearance of tinea corporis. I am concerned about possibility of DVT in the right leg, so a d-dimer will be checked. If positive, she will need to be set up for a venous Doppler in the morning.  D-dimer is come back borderline but actually significantly down from her baseline study done 6 months ago, so I do not feel a venous Doppler is necessary. Sedimentation rate has come back at the upper limits of normal, but I still feel  that she has an underlying cellulitis. She is discharged with prescriptions for cephalexin and terbinafine as well as oxycodone and acetaminophen for pain. She is to followup with her PCP.  Delora Fuel, MD 28/78/67 6720

## 2013-07-26 NOTE — ED Notes (Addendum)
C/o right leg pain that started one month ago. Denies any injury. Describes as tender to touch. Pt has skin peeling to right lower extremity with 1 plus edema to that leg. Skin darker to right lower leg. Pos right pedal pulse.

## 2013-08-23 ENCOUNTER — Other Ambulatory Visit: Payer: Self-pay | Admitting: Family Medicine

## 2013-08-23 DIAGNOSIS — M7989 Other specified soft tissue disorders: Principal | ICD-10-CM

## 2013-08-23 DIAGNOSIS — M79661 Pain in right lower leg: Secondary | ICD-10-CM

## 2013-08-31 ENCOUNTER — Encounter: Payer: Self-pay | Admitting: Cardiology

## 2013-09-12 ENCOUNTER — Emergency Department (HOSPITAL_COMMUNITY)
Admission: EM | Admit: 2013-09-12 | Discharge: 2013-09-12 | Disposition: A | Discharge status: Home / Self Care | Payer: Medicare Other | Attending: Emergency Medicine | Admitting: Emergency Medicine

## 2013-09-12 ENCOUNTER — Encounter (HOSPITAL_COMMUNITY): Payer: Self-pay | Admitting: Emergency Medicine

## 2013-09-12 ENCOUNTER — Emergency Department (HOSPITAL_COMMUNITY)
Admission: EM | Admit: 2013-09-12 | Discharge: 2013-09-12 | Discharge status: Against Medical Advice | Payer: Medicare Other

## 2013-09-12 DIAGNOSIS — R609 Edema, unspecified: Secondary | ICD-10-CM | POA: Diagnosis not present

## 2013-09-12 DIAGNOSIS — Z8739 Personal history of other diseases of the musculoskeletal system and connective tissue: Secondary | ICD-10-CM | POA: Diagnosis not present

## 2013-09-12 DIAGNOSIS — E785 Hyperlipidemia, unspecified: Secondary | ICD-10-CM | POA: Insufficient documentation

## 2013-09-12 DIAGNOSIS — Z9981 Dependence on supplemental oxygen: Secondary | ICD-10-CM | POA: Insufficient documentation

## 2013-09-12 DIAGNOSIS — G4733 Obstructive sleep apnea (adult) (pediatric): Secondary | ICD-10-CM | POA: Diagnosis not present

## 2013-09-12 DIAGNOSIS — M7989 Other specified soft tissue disorders: Secondary | ICD-10-CM

## 2013-09-12 DIAGNOSIS — Z79899 Other long term (current) drug therapy: Secondary | ICD-10-CM | POA: Insufficient documentation

## 2013-09-12 DIAGNOSIS — F329 Major depressive disorder, single episode, unspecified: Secondary | ICD-10-CM | POA: Insufficient documentation

## 2013-09-12 DIAGNOSIS — I10 Essential (primary) hypertension: Secondary | ICD-10-CM | POA: Diagnosis not present

## 2013-09-12 DIAGNOSIS — E210 Primary hyperparathyroidism: Secondary | ICD-10-CM | POA: Diagnosis not present

## 2013-09-12 DIAGNOSIS — F411 Generalized anxiety disorder: Secondary | ICD-10-CM | POA: Diagnosis not present

## 2013-09-12 DIAGNOSIS — M79609 Pain in unspecified limb: Secondary | ICD-10-CM

## 2013-09-12 DIAGNOSIS — M79604 Pain in right leg: Secondary | ICD-10-CM

## 2013-09-12 DIAGNOSIS — F3289 Other specified depressive episodes: Secondary | ICD-10-CM | POA: Diagnosis not present

## 2013-09-12 DIAGNOSIS — G8929 Other chronic pain: Secondary | ICD-10-CM | POA: Insufficient documentation

## 2013-09-12 DIAGNOSIS — R600 Localized edema: Secondary | ICD-10-CM

## 2013-09-12 DIAGNOSIS — Z87891 Personal history of nicotine dependence: Secondary | ICD-10-CM | POA: Insufficient documentation

## 2013-09-12 DIAGNOSIS — Z8719 Personal history of other diseases of the digestive system: Secondary | ICD-10-CM | POA: Diagnosis not present

## 2013-09-12 DIAGNOSIS — I5032 Chronic diastolic (congestive) heart failure: Secondary | ICD-10-CM | POA: Diagnosis not present

## 2013-09-12 MED ORDER — OXYCODONE-ACETAMINOPHEN 5-325 MG PO TABS: 1.0000 | ORAL_TABLET | Freq: Four times a day (QID) | ORAL | Status: DC | PRN

## 2013-09-12 MED ORDER — OXYCODONE-ACETAMINOPHEN 5-325 MG PO TABS
1.0000 | ORAL_TABLET | Freq: Once | ORAL | Status: AC
Start: 2013-09-12 — End: 2013-09-12
  Administered 2013-09-12: 1 via ORAL
  Filled 2013-09-12: qty 1

## 2013-09-12 NOTE — ED Provider Notes (Signed)
I saw and evaluated the patient, reviewed the resident's note and I agree with the findings and plan.   EKG Interpretation None      Pt is 73 y.o. F with h/o hypertension, hyperlipidemia, diastolic heart failure who presents emergency department 3 months of swelling, redness and pain to her right lower extremity. She was sent from urgent care today to rule out a DVT. She states she went to urgent care because she could not sleep last night because the pain. She states that she feels like her symptoms are progressively getting slightly worse but states that the erythema to this area has not changed. She denies any fevers, chills, nausea, vomiting, diarrhea, chest pain or shortness of breath. On exam, patient does have some swelling to this lower extremity when compared to the left but there is no pitting edema. 2+ DP pulses bilaterally. Both extremities are warm and well-perfused. 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 There is some scaling of her skin but no blisters, fluctuance or induration. Patient reports that she has been on 2 rounds of antibiotics for this without any improvement. I doubt that this is cellulitis today. I do not feel she needs further emergent workup at this time as this is a chronic condition. She has a dermatology appointment next week. Have discussed with her strict return precautions and supportive care instructions including compression hose, elevation. We'll discharge with pain medication. She verbalized understanding is comfortable plan.  [place], DO 09/12/13 [phone]

## 2013-09-12 NOTE — ED Notes (Signed)
Pt here for right lower leg swelling, onset several days ago and getting worse, skin peeling, warm to touch, pulses strong in bilateral feet. sts pain is 10/10

## 2013-09-12 NOTE — ED Notes (Signed)
Pt reports her RLE has been increasingly painful over past 3 months. She has been treated with 2 antibiotics for cellulitis and also had a doppler for DVT last month. She went to [facility] walk in today for increased pain and they wanted her to come to ED for another doppler of RLE. RLE swollen, red, dried flaky skin to calf area. Cms intact.

## 2013-09-12 NOTE — Progress Notes (Signed)
VASCULAR LAB PRELIMINARY  PRELIMINARY  PRELIMINARY  PRELIMINARY  Right lower extremity venous Doppler completed.    Preliminary report:  There is no DVT or SVT noted in the right lower extremity.    Soma Bachand, RVT 09/12/2013, 4:05 PM

## 2013-09-12 NOTE — ED Provider Notes (Signed)
CSN: [id number]     Arrival date & time 09/12/13  1314 History   First MD Initiated Contact with Patient 09/12/13 1652     Chief Complaint  Patient presents with  . Leg Swelling    Patient is a 73 y.o. female presenting with leg pain. The history is provided by the patient and medical records.  Leg Pain Location:  Ankle Injury: no   Ankle location:  R ankle Pain details:    Quality:  Burning   Radiates to:  Does not radiate   Severity:  Mild   Onset quality:  Gradual   Duration:  3 months   Timing:  Constant   Subjective pain progression: improved somewhat but pain still present. Chronicity:  Chronic Prior injury to area:  No Risk factors: no frequent fractures    Pt reports 3 months of RLE edema and pain. Was treated for cellulitis 2x before and symptoms improved. She says her edema and redness have improved greatly after abx but that the pain is worsening.  Neurontin is not helping.  Sent from [facility] for DVT study.  No fevers, weeping, spreading redness, or drainage. No CP/ pleurisy/ hemoptysis.   Past Medical History  Diagnosis Date  . Hyperlipidemia   . Pedal edema     none in last 6 months  . Spinal stenosis   . Varicose veins     "BLE" (07/29/2012)  . Primary hyperparathyroidism   . Thyroid disease   . Incontinent of urine     wears pads  . Complication of anesthesia     "I have apnea" (07/29/2012)  . Family history of anesthesia complication     "some wake up during OR; some are hard to wake up; some both" (07/29/2012)  . OSA on CPAP     "dx'd just last week; doesn't wear mask" (07/29/2012)  . Arthritis     "legs, back" (07/29/2012)  . Osteoarthritis of right knee   . Umbilical hernia     "unrepaired" (07/29/2012)  . Chronic lower back pain   . History of stomach ulcers 1980's  . Anxiety   . Depression   . GERD (gastroesophageal reflux disease)   . Hypertension   . Chronic diastolic CHF (congestive heart failure)    Past Surgical History  Procedure Laterality  Date  . Iud removal  1980's  . Parathyroidectomy N/A 06/16/2012    Procedure: NECK EXPLORATION AND LEFT SUPERIOR PARATHYROIDECTOMY;  Surgeon: Earnstine Regal, MD;  Location: [facility];  Service: General;  Laterality: N/A;  . Colonoscopy    . 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 Cholecystectomy  ~ 2010  . Total knee arthroplasty Right 07/29/2012    Procedure: TOTAL KNEE ARTHROPLASTY;  Surgeon: Ninetta Lights, MD;  Location: [facility];  Service: Orthopedics;  Laterality: Right;   Family History  Problem Relation Age of Onset  . Hypertension Mother     deceased  . Lung cancer Father     deceased   History  Substance Use Topics  . Smoking status: Former Smoker -- 1.00 packs/day for 30 years    Types: Cigarettes    Quit date: 01/08/1988  . Smokeless tobacco: Never Used  . Alcohol Use: No   OB History   Grav Para Term Preterm Abortions TAB SAB Ect Mult Living                 Review of Systems    Allergies  Lipitor; Simvastatin; and Ace inhibitors  Home Medications  Prior to Admission medications   Medication Sig Start Date End Date Taking? Authorizing Provider  buPROPion (WELLBUTRIN) 100 MG tablet Take 100-200 mg by mouth 2 (two) times daily. 200mg  in am and 100mg  in pm   Yes Historical Provider, MD  ergocalciferol (VITAMIN D2) 50000 UNITS capsule Take 50,000 Units by mouth every Saturday.   Yes Historical Provider, MD  furosemide (LASIX) 20 MG tablet Take 20 mg by mouth daily.   Yes Historical Provider, MD  gabapentin (NEURONTIN) 300 MG capsule Take 300 mg by mouth at bedtime. 08/23/13  Yes Historical Provider, MD  Multiple Vitamin (MULTIVITAMIN WITH MINERALS) TABS Take 1 tablet by mouth daily.   Yes Historical Provider, MD  pravastatin (PRAVACHOL) 20 MG tablet Take 20 mg by mouth every morning. 07/21/13  Yes Historical Provider, MD  valsartan (DIOVAN) 320 MG tablet Take 320 mg by mouth daily.   Yes Historical Provider, MD  vitamin B-12 (CYANOCOBALAMIN) 100 MCG tablet Take 100  mcg by mouth daily.   Yes Historical Provider, MD  oxyCODONE-acetaminophen (PERCOCET/ROXICET) 5-325 MG per tablet Take 1 tablet by mouth every 6 (six) hours as needed for severe pain. 09/12/13   Tammy Sours, MD   BP 160/60  Pulse 64  Temp(Src) 98.1 F (36.7 C) (Oral)  Resp 23  Ht 5\' 5"  (1.651 m)  Wt 187 lb (84.823 kg)  BMI 31.12 kg/m2  SpO2 100% Physical Exam  Nursing note and vitals reviewed. Constitutional: She is oriented to person, place, and time. She appears well-developed and well-nourished. No distress.  Comfortable appearing.   HENT:  Head: Normocephalic and atraumatic.  Nose: Nose normal.  Eyes: Conjunctivae are normal.  Neck: Normal range of motion. Neck supple. No tracheal deviation present.  Cardiovascular: Normal rate, regular rhythm and normal heart sounds.   No murmur heard. Strong DP pulses equal  Pulmonary/Chest: Effort normal and breath sounds normal. No respiratory distress. She has no wheezes. She has no rales.  Abdominal: Soft. Bowel sounds are normal. She exhibits no distension and no mass. There is no tenderness.  Musculoskeletal: Normal range of motion.  2+ nonpitting edema to knee on right, greater than left.  +TTP. Not weeping.  Dry skin peeling off.  No wounds.  No spreading erythema. Warmth is similar over affected area as other extremities.   Neurological: She is alert and oriented to person, place, and time.  No sensory or motor deficit  Skin: Skin is warm and dry. No rash noted.  See MSK. No sores or wounds  Psychiatric: She has a normal mood and affect.    ED Course  Procedures (including critical care time) Labs Review Labs Reviewed - No data to display  Imaging Review No results found.   EKG Interpretation None      MDM   Final diagnoses:  Edema of right lower extremity  Lower extremity pain, right    Pt presents with RLE  edema and pain for 3 months. Chronic in nature without acute change. Been treated for cellulitis  previously. Sent for DVT study which was negative. No systemic symptoms such a f/c/CP/dyspnea.  I do not feel sx are c.w. Cellulitis, defer abx. DDx: Venous stasis dermatitis vs lymphadema. Provided analgesia.  Pt has f/u with dermatology arranged next week. Pt agreeable with dispo plan.  Tammy Sours, MD 09/12/13 [phone]

## 2013-10-11 ENCOUNTER — Encounter: Payer: Medicare Other | Admitting: Cardiology

## 2013-10-11 NOTE — Progress Notes (Signed)
This encounter was created in error - please disregard.

## 2013-10-22 ENCOUNTER — Ambulatory Visit: Payer: Medicare Other | Admitting: Cardiology

## 2013-11-10 ENCOUNTER — Encounter: Payer: Self-pay | Admitting: Cardiology

## 2013-11-15 ENCOUNTER — Encounter: Payer: Self-pay | Admitting: Cardiology

## 2013-11-15 ENCOUNTER — Ambulatory Visit (INDEPENDENT_AMBULATORY_CARE_PROVIDER_SITE_OTHER): Payer: Medicare Other | Admitting: Cardiology

## 2013-11-15 VITALS — BP 140/80 | HR 75 | Ht 65.0 in | Wt 191.8 lb

## 2013-11-15 DIAGNOSIS — Z9989 Dependence on other enabling machines and devices: Principal | ICD-10-CM

## 2013-11-15 DIAGNOSIS — G4733 Obstructive sleep apnea (adult) (pediatric): Secondary | ICD-10-CM

## 2013-11-15 DIAGNOSIS — I5032 Chronic diastolic (congestive) heart failure: Secondary | ICD-10-CM

## 2013-11-15 DIAGNOSIS — E669 Obesity, unspecified: Secondary | ICD-10-CM

## 2013-11-15 DIAGNOSIS — I10 Essential (primary) hypertension: Secondary | ICD-10-CM

## 2013-11-15 NOTE — Progress Notes (Signed)
[place], [address], [place]  [address] Phone: [phone] Fax:  [phone]  Date:  11/15/2013   ID:  Susan [name], DOB 01/15/1940, MRN [id number]  PCP:  [name], MD  Cardiologist:  Fransico Him, MD    History of Present Illness: Susan [name] is a 73 y.o. female with a history of OSA, HTN, obesity and chronic diastolic CHF.  She denies any SOB at this time unless she walks more than 10 minutes. Her LE edema has improved and she has not had any further CP.  She tolerates her CPAP well.  She tolerates the mask and feels the pressure is ok.  She denies any palpitations, dizziness but does chronic LE edema which is stable.  She feels rested in the am and has not daytime sleepiness but will feel tired toward the end of the day.   Wt Readings from Last 3 Encounters:  11/15/13 191 lb 12.8 oz (87 kg)  09/12/13 187 lb (84.823 kg)  07/26/13 198 lb (89.812 kg)     Past Medical History  Diagnosis Date  . Hyperlipidemia   . Pedal edema   . Spinal stenosis   . Varicose veins     "BLE" (07/29/2012)  . Primary hyperparathyroidism   . Thyroid disease   . Incontinent of urine     wears pads  . Complication of anesthesia     "I have apnea" (07/29/2012)  . Family history of anesthesia complication     "some wake up during OR; some are hard to wake up; some both" (07/29/2012)  . OSA on CPAP   . Arthritis     "legs, back" (07/29/2012)  . Osteoarthritis of right knee   . Umbilical hernia     "unrepaired" (07/29/2012)  . Chronic lower back pain   . History of stomach ulcers 1980's  . Anxiety   . Depression   . GERD (gastroesophageal reflux disease)   . Hypertension   . Chronic diastolic CHF (congestive heart failure)     Current Outpatient Prescriptions  Medication Sig Dispense Refill  . buPROPion (WELLBUTRIN SR) 100 MG 12 hr tablet Take 1 tablet by mouth daily.    . ergocalciferol (VITAMIN D2) 50000 UNITS capsule Take 50,000 Units by mouth every Saturday.      . furosemide (LASIX) 20 MG tablet Take 20 mg by mouth daily.    . Multiple Vitamin (MULTIVITAMIN WITH MINERALS) TABS Take 1 tablet by mouth daily.    . naproxen (NAPROSYN) 500 MG tablet Take 500 mg by mouth daily as needed (pain).     . pravastatin (PRAVACHOL) 20 MG tablet Take 20 mg by mouth every morning.    . triamcinolone cream (KENALOG) 0.1 % Apply 1 application topically 2 (two) times daily.     . valsartan (DIOVAN) 320 MG tablet Take 320 mg by mouth daily.    . vitamin B-12 (CYANOCOBALAMIN) 100 MCG tablet Take 100 mcg by mouth daily.    . pantoprazole (PROTONIX) 20 MG tablet Take 20 mg by mouth daily as needed for heartburn.      No current facility-administered medications for this visit.    Allergies:    Allergies  Allergen Reactions  . Lipitor [Atorvastatin] Swelling  . Simvastatin Other (See Comments)    Myalgias     . Ace Inhibitors Cough    Social History:  The patient  reports that she quit smoking about 25 years ago. Her smoking use included Cigarettes. She has a 30  pack-year smoking history. She has never used smokeless tobacco. She reports that she does not drink alcohol or use illicit drugs.   Family History:  The patient's family history includes Hypertension in her mother; Lung cancer in her father.   ROS:  Please see the history of present illness.      All other systems reviewed and negative.   PHYSICAL EXAM: VS:  BP 140/80 mmHg  Pulse 75  Ht 5\' 5"  (1.651 m)  Wt 191 lb 12.8 oz (87 kg)  BMI 31.92 kg/m2 Well nourished, well developed, in no acute distress HEENT: normal Neck: no JVD Cardiac:  normal S1, S2; RRR; no murmur Lungs:  clear to auscultation bilaterally, no wheezing, rhonchi or rales Abd: soft, nontender, no hepatomegaly Ext: no edema Skin: warm and dry Neuro:  CNs 2-12 intact, no focal abnormalities noted  ASSESSMENT AND PLAN:  1. Atypical Chest pain with no ischemia on nuclear stress test and low normal LVF on echo- no further CP 2.  HTN - controlled  - continue Diovan  3. SOB Secondary to diastolic CHF -now on Lasix and SOB has resolved. - continue Lasix 4.  OSA tolerating CPAP - will get a d/l from her DME  Followup with me in 6 months      Signed, Fransico Him, MD [facility] 11/15/2013 2:12 PM

## 2013-11-15 NOTE — Patient Instructions (Signed)
Your physician recommends that you continue on your current medications as directed. Please refer to the Current Medication list given to you today.  Your physician wants you to follow-up in: 6 months with Dr Turner You will receive a reminder letter in the mail two months in advance. If you don't receive a letter, please call our office to schedule the follow-up appointment.  

## 2013-12-23 ENCOUNTER — Other Ambulatory Visit: Payer: Self-pay | Admitting: Internal Medicine

## 2014-01-02 ENCOUNTER — Other Ambulatory Visit: Payer: Self-pay | Admitting: Cardiology

## 2014-02-21 ENCOUNTER — Encounter: Payer: Self-pay | Admitting: Cardiology

## 2014-04-28 ENCOUNTER — Other Ambulatory Visit: Payer: Self-pay | Admitting: Physical Medicine and Rehabilitation

## 2014-04-28 DIAGNOSIS — M48061 Spinal stenosis, lumbar region without neurogenic claudication: Secondary | ICD-10-CM

## 2014-05-20 ENCOUNTER — Other Ambulatory Visit: Payer: Self-pay

## 2014-05-30 ENCOUNTER — Ambulatory Visit (HOSPITAL_COMMUNITY): Payer: Self-pay | Admitting: Clinical

## 2014-05-31 ENCOUNTER — Ambulatory Visit (INDEPENDENT_AMBULATORY_CARE_PROVIDER_SITE_OTHER): Payer: Self-pay | Admitting: Clinical

## 2014-05-31 ENCOUNTER — Encounter (HOSPITAL_COMMUNITY): Payer: Self-pay | Admitting: Clinical

## 2014-05-31 DIAGNOSIS — F331 Major depressive disorder, recurrent, moderate: Secondary | ICD-10-CM
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
## 2014-06-07 ENCOUNTER — Ambulatory Visit
Admission: RE | Admit: 2014-06-07 | Discharge: 2014-06-07 | Disposition: A | Discharge status: Home / Self Care | Payer: PPO | Source: Ambulatory Visit | Attending: Physical Medicine and Rehabilitation | Admitting: Physical Medicine and Rehabilitation

## 2014-06-07 DIAGNOSIS — M48061 Spinal stenosis, lumbar region without neurogenic claudication: Secondary | ICD-10-CM

## 2014-07-04 ENCOUNTER — Encounter: Payer: Self-pay | Admitting: Cardiology

## 2014-07-04 ENCOUNTER — Ambulatory Visit (INDEPENDENT_AMBULATORY_CARE_PROVIDER_SITE_OTHER): Payer: PPO | Admitting: Cardiology

## 2014-07-04 VITALS — BP 130/78 | HR 70 | Ht 65.0 in | Wt 200.0 lb

## 2014-07-04 DIAGNOSIS — I10 Essential (primary) hypertension: Secondary | ICD-10-CM | POA: Diagnosis not present

## 2014-07-04 DIAGNOSIS — I5032 Chronic diastolic (congestive) heart failure: Secondary | ICD-10-CM | POA: Diagnosis not present

## 2014-07-04 DIAGNOSIS — E669 Obesity, unspecified: Secondary | ICD-10-CM

## 2014-07-04 DIAGNOSIS — G4733 Obstructive sleep apnea (adult) (pediatric): Secondary | ICD-10-CM

## 2014-07-04 NOTE — Patient Instructions (Signed)
Medication Instructions:  Your physician recommends that you continue on your current medications as directed. Please refer to the Current Medication list given to you today.   Labwork: None  Testing/Procedures: None  Follow-Up: Your physician wants you to follow-up in: 6 months with Dr. Radford Pax. You will receive a reminder letter in the mail two months in advance. If you don't receive a letter, please call our office to schedule the follow-up appointment.   Any Other Special Instructions Will Be Listed Below (If Applicable). [facility] will be in touch with you soon to supply you with your new mask.

## 2014-07-04 NOTE — Progress Notes (Signed)
Cardiology Office Note   Date:  07/04/2014   ID:  KINGSLEY HERANDEZ, DOB September 08, 1940, MRN [id number]  PCP:  [name], MD    Chief Complaint  Patient presents with  . Follow-up    OSA      History of Present Illness: Susan [name] is a 74 y.o. female with a history of OSA, HTN, obesity and chronic diastolic CHF. She denies any SOB at this time unless she walks more than 10 minutes. Her LE edema has improved and she has not had any further CP. She tolerates her CPAP well but had not been using it for a while and recently started back on it. She tolerates the full face mask but has problems with it leaking but feels the pressure is ok.She says that her mask is old.   She denies any palpitations, dizziness but does chronic LE edema which is stable. She feels rested in the am and has not daytime sleepiness but will feel tired toward the end of the day.  She denies any chest pain or SOB.       Past Medical History  Diagnosis Date  . Hyperlipidemia   . Pedal edema   . Spinal stenosis   . Varicose veins     "BLE" (07/29/2012)  . Primary hyperparathyroidism   . Thyroid disease   . Incontinent of urine     wears pads  . Complication of anesthesia     "I have apnea" (07/29/2012)  . Family history of anesthesia complication     "some wake up during OR; some are hard to wake up; some both" (07/29/2012)  . OSA on CPAP   . Arthritis     "legs, back" (07/29/2012)  . Osteoarthritis of right knee   . Umbilical hernia     "unrepaired" (07/29/2012)  . Chronic lower back pain   . History of stomach ulcers 1980's  . Anxiety   . Depression   . GERD (gastroesophageal reflux disease)   . Hypertension   . Chronic diastolic CHF (congestive heart failure)     Past Surgical History  Procedure Laterality Date  . Iud removal  1980's  . Parathyroidectomy N/A 06/16/2012    Procedure: NECK EXPLORATION AND LEFT SUPERIOR PARATHYROIDECTOMY;  Surgeon: Earnstine Regal, MD;   Location: [facility];  Service: General;  Laterality: N/A;  . Colonoscopy    . 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 Cholecystectomy  ~ 2010  . Total knee arthroplasty Right 07/29/2012    Procedure: TOTAL KNEE ARTHROPLASTY;  Surgeon: Ninetta Lights, MD;  Location: [facility];  Service: Orthopedics;  Laterality: Right;     Current Outpatient Prescriptions  Medication Sig Dispense Refill  . buPROPion (WELLBUTRIN XL) 300 MG 24 hr tablet Take 300 mg by mouth daily.  12  . ergocalciferol (VITAMIN D2) 50000 UNITS capsule Take 50,000 Units by mouth every Saturday. Pt states that she takes it twice a week    . gabapentin (NEURONTIN) 300 MG capsule Take 300 mg by mouth at bedtime.  3  . Multiple Vitamin (MULTIVITAMIN WITH MINERALS) TABS Take 1 tablet by mouth daily.    . pantoprazole (PROTONIX) 20 MG tablet Take 20 mg by mouth daily as needed for heartburn.     . pravastatin (PRAVACHOL) 20 MG tablet Take 20 mg by mouth every morning.    . triamcinolone cream (KENALOG) 0.1 % Apply 1 application  topically 2 (two) times daily.     . valsartan (DIOVAN) 320 MG tablet TAKE 1 TABLET BY MOUTH DAILY 30 tablet 6  . vitamin B-12 (CYANOCOBALAMIN) 100 MCG tablet Take 100 mcg by mouth daily.     No current facility-administered medications for this visit.    Allergies:   Lipitor; Simvastatin; and Ace inhibitors    Social History:  The patient  reports that she quit smoking about 26 years ago. Her smoking use included Cigarettes. She has a 30 pack-year smoking history. She has never used smokeless tobacco. She reports that she does not drink alcohol or use illicit drugs.   Family History:  The patient's family history includes Hypertension in her mother; Lung cancer in her father.    ROS:  Please see the history of present illness.   Otherwise, review of systems are positive for none.   All other systems are reviewed and negative.    PHYSICAL EXAM: VS:  BP 130/78 mmHg  Pulse 70  Ht 5\' 5"  (1.651 m)  Wt 200  lb (90.719 kg)  BMI 33.28 kg/m2  SpO2 96% , BMI Body mass index is 33.28 kg/(m^2). GEN: Well nourished, well developed, in no acute distress HEENT: normal Neck: no JVD, carotid bruits, or masses Cardiac: RRR; no murmurs, rubs, or gallops.  Trace edema Respiratory:  clear to auscultation bilaterally, normal work of breathing GI: soft, nontender, nondistended, + BS MS: no deformity or atrophy Skin: warm and dry, no rash Neuro:  Strength and sensation are intact Psych: euthymic mood, full affect   EKG:  EKG is not ordered today.    Recent Labs: 07/26/2013: BUN 24*; Creatinine, Ser 1.30*; Hemoglobin 11.1*; Platelets 271; Potassium 4.2; Sodium 143    Lipid Panel No results found for: CHOL, TRIG, HDL, CHOLHDL, VLDL, LDLCALC, LDLDIRECT    Wt Readings from Last 3 Encounters:  07/04/14 200 lb (90.719 kg)  11/15/13 191 lb 12.8 oz (87 kg)  09/12/13 187 lb (84.823 kg)    ASSESSMENT AND PLAN:  1. Atypical Chest pain with no ischemia on nuclear stress test and low normal LVF on echo- no further CP 2. HTN - controlled  - continue Diovan  3.Chronic diastolic CHF - only minimal LE edema but no SOB and appears euvolemic on exam.  She says that she cannot take lasix due to an allergy but she does not remember what it is.  She has not been taking diuretics in some time.  4. OSA tolerating CPAP - her d/l today showed an AHI of 6.4 events per hour on 10cm H2O and 23% compliance in using more than 4 hours nightly. Her mask is old and leaking so I will order a new mask and then get another d/l in 4 weeks to see if a new mask with now leaking lowers her AHI before any further adjustments are made to her device.   Current medicines are reviewed at length with the patient today.  The patient does not have concerns regarding medicines.  The following changes have been made:  no change  Labs/ tests ordered today: See above Assessment and Plan No orders of the defined types were placed in this  encounter.     Disposition:   FU with me in 6 months  Signed, Sueanne Margarita, MD  07/04/2014 11:39 AM    [facility] [place], [place], [place]  [address] Phone: [phone]; Fax: [phone]

## 2014-07-04 NOTE — Addendum Note (Signed)
Addended by: Harland German A on: 07/04/2014 01:48 PM   Modules accepted: Orders

## 2014-07-07 ENCOUNTER — Encounter: Payer: Self-pay | Admitting: Cardiology

## 2014-07-12 ENCOUNTER — Ambulatory Visit (HOSPITAL_COMMUNITY): Payer: Self-pay | Admitting: Clinical

## 2014-07-20 ENCOUNTER — Encounter: Payer: Self-pay | Admitting: Podiatry

## 2014-07-20 ENCOUNTER — Ambulatory Visit (INDEPENDENT_AMBULATORY_CARE_PROVIDER_SITE_OTHER): Payer: PPO | Admitting: Podiatry

## 2014-07-20 VITALS — BP 128/68 | HR 71 | Resp 16

## 2014-07-20 DIAGNOSIS — M79676 Pain in unspecified toe(s): Secondary | ICD-10-CM

## 2014-07-20 DIAGNOSIS — M79675 Pain in left toe(s): Secondary | ICD-10-CM

## 2014-07-20 DIAGNOSIS — M7741 Metatarsalgia, right foot: Secondary | ICD-10-CM

## 2014-07-20 DIAGNOSIS — B351 Tinea unguium: Secondary | ICD-10-CM

## 2014-07-20 DIAGNOSIS — M7742 Metatarsalgia, left foot: Secondary | ICD-10-CM

## 2014-07-20 DIAGNOSIS — M79674 Pain in right toe(s): Secondary | ICD-10-CM

## 2014-07-20 NOTE — Progress Notes (Signed)
   Subjective:    Patient ID: Susan [name], female    DOB: 13-Aug-1940, 74 y.o.   MRN: [id number]  HPIThis patient presents to the office with pain related to her long thick nails.  She also  She says the nails have bocome thick and long and painful walking and wearing her shoes.  She says her feet are painful all through her feet.  She denies trauma or injury and no swelling is noted.. .  .    Review of Systems  Cardiovascular: Positive for leg swelling.  Musculoskeletal:       Joint pain   All other systems reviewed and are negative.      Objective:   Physical Exam Objective: Review of past medical history, medications, social history and allergies were performed.  Vascular: Dorsalis pedis and posterior tibial pulses were palpable B/L, capillary refill was  WNL B/L, temperature gradient was WNL B/L   Skin:  No signs of symptoms of infection or ulcers on both feet  Nails: thick disfigured discolored nails both feet.  No signs of redness or infection.  Sensory: Thornell Mule monifilament WNL   Orthopedic: Orthopedic evaluation demonstrates all joints distal t ankle have full ROM without crepitus, muscle power WNL B/L.  She has pain upon dorsal palpation through every metatarsal both feet.  No redness or swelling or pain on motion of joints.        Assessment & Plan:  Onychomycosis B/L   Metatarsalgia B/L  IE  Debridement of Onychomycotic  Nails. Noted her pain is present in all metatarsals in the absence of any inflammation.  Prescribed Mobic and recommended better footgear.

## 2014-10-20 ENCOUNTER — Other Ambulatory Visit: Payer: Self-pay | Admitting: Cardiology

## 2014-11-24 ENCOUNTER — Other Ambulatory Visit: Payer: Self-pay | Admitting: Cardiology

## 2014-12-22 ENCOUNTER — Other Ambulatory Visit: Payer: Self-pay | Admitting: Neurosurgery

## 2015-01-16 ENCOUNTER — Encounter (HOSPITAL_COMMUNITY): Payer: Self-pay

## 2015-01-16 ENCOUNTER — Ambulatory Visit: Payer: PPO | Admitting: Cardiology

## 2015-01-16 ENCOUNTER — Encounter (HOSPITAL_COMMUNITY)
Admission: RE | Admit: 2015-01-16 | Discharge: 2015-01-16 | Disposition: A | Discharge status: Home / Self Care | Payer: PPO | Source: Ambulatory Visit | Attending: Neurosurgery | Admitting: Neurosurgery

## 2015-01-16 DIAGNOSIS — Z87891 Personal history of nicotine dependence: Secondary | ICD-10-CM | POA: Diagnosis not present

## 2015-01-16 DIAGNOSIS — K219 Gastro-esophageal reflux disease without esophagitis: Secondary | ICD-10-CM | POA: Diagnosis not present

## 2015-01-16 DIAGNOSIS — I444 Left anterior fascicular block: Secondary | ICD-10-CM | POA: Insufficient documentation

## 2015-01-16 DIAGNOSIS — F0390 Unspecified dementia without behavioral disturbance: Secondary | ICD-10-CM | POA: Diagnosis not present

## 2015-01-16 DIAGNOSIS — Z79899 Other long term (current) drug therapy: Secondary | ICD-10-CM | POA: Insufficient documentation

## 2015-01-16 DIAGNOSIS — E210 Primary hyperparathyroidism: Secondary | ICD-10-CM | POA: Insufficient documentation

## 2015-01-16 DIAGNOSIS — I5032 Chronic diastolic (congestive) heart failure: Secondary | ICD-10-CM | POA: Diagnosis not present

## 2015-01-16 DIAGNOSIS — G4733 Obstructive sleep apnea (adult) (pediatric): Secondary | ICD-10-CM | POA: Diagnosis not present

## 2015-01-16 DIAGNOSIS — I110 Hypertensive heart disease with heart failure: Secondary | ICD-10-CM | POA: Insufficient documentation

## 2015-01-16 DIAGNOSIS — E785 Hyperlipidemia, unspecified: Secondary | ICD-10-CM | POA: Diagnosis not present

## 2015-01-16 DIAGNOSIS — Z0183 Encounter for blood typing: Secondary | ICD-10-CM | POA: Insufficient documentation

## 2015-01-16 DIAGNOSIS — Z01818 Encounter for other preprocedural examination: Secondary | ICD-10-CM | POA: Insufficient documentation

## 2015-01-16 DIAGNOSIS — Z01812 Encounter for preprocedural laboratory examination: Secondary | ICD-10-CM | POA: Insufficient documentation

## 2015-01-16 DIAGNOSIS — M4316 Spondylolisthesis, lumbar region: Secondary | ICD-10-CM | POA: Insufficient documentation

## 2015-01-16 HISTORY — DX: Unspecified dementia without behavioral disturbance: F03.90

## 2015-01-16 LAB — TYPE AND SCREEN
ABO/RH(D): B POS
ANTIBODY SCREEN: NEGATIVE

## 2015-01-16 LAB — BASIC METABOLIC PANEL
ANION GAP: 7 (ref 5–15)
BUN: 15 mg/dL (ref 6–20)
CALCIUM: 10.2 mg/dL (ref 8.9–10.3)
CO2: 26 mmol/L (ref 22–32)
Chloride: 109 mmol/L (ref 101–111)
Creatinine, Ser: 1.12 mg/dL — ABNORMAL HIGH (ref 0.44–1.00)
GFR calc Af Amer: 54 mL/min — ABNORMAL LOW (ref >60)
GFR, EST NON AFRICAN AMERICAN: 46 mL/min — AB (ref >60)
GLUCOSE: 91 mg/dL (ref 65–99)
Potassium: 4.1 mmol/L (ref 3.5–5.1)
Sodium: 142 mmol/L (ref 135–145)

## 2015-01-16 LAB — CBC
HCT: 38.2 % (ref 36.0–46.0)
HEMOGLOBIN: 12.8 g/dL (ref 12.0–15.0)
MCH: 31.4 pg (ref 26.0–34.0)
MCHC: 33.5 g/dL (ref 30.0–36.0)
MCV: 93.9 fL (ref 78.0–100.0)
Platelets: 232 10*3/uL (ref 150–400)
RBC: 4.07 MIL/uL (ref 3.87–5.11)
RDW: 13 % (ref 11.5–15.5)
WBC: 4 10*3/uL (ref 4.0–10.5)

## 2015-01-16 LAB — SURGICAL PCR SCREEN
MRSA, PCR: NEGATIVE
STAPHYLOCOCCUS AUREUS: NEGATIVE

## 2015-01-16 NOTE — Progress Notes (Signed)
PCP:Dr.Elaine RZ:[id number] [place] Cardiologist: Dr. Fransico Him, pt. Has an appointment today to see her. Pt. Stated she will let Dr. Radford Pax know she is having surgery.

## 2015-01-16 NOTE — Pre-Procedure Instructions (Signed)
    Susan [name]  01/16/2015      [place], [address] [place] [address] Phone: [phone] Fax: [phone]  [facility] [place], [address] [place] [address] Phone: [phone] Fax: [phone]  [facility] - [place], [address] [place] [address] Phone: [phone] Fax: [phone]    Your procedure is scheduled on Friday, Jan. 13  Report to [facility] Admitting at 7:00 A.M.  Call this number if you have problems the morning of surgery:  [phone]              Any questions prior to surgery call [phone] Monday-Friday 8am-4pm   Remember:  Do not eat food or drink liquids after midnight Jan. 12   Take these medicines the morning of surgery with A SIP OF WATER : bupropion (wellbutrin), gabapentin (neurontin)              Stop aspirin, motrin, ibuprofen, BC'S, aleve,herbal medicines/suppliments today   Do not wear jewelry, make-up or nail polish.  Do not wear lotions, powders, or perfumes.  You may not wear deodorant.  Do not shave 48 hours prior to surgery.  Men may shave face and neck.  Do not bring valuables to the hospital.  [facility] is not responsible for any belongings or valuables.  Contacts, dentures or bridgework may not be worn into surgery.  Leave your suitcase in the car.  After surgery it may be brought to your room.  For patients admitted to the hospital, discharge time will be determined by your treatment team.  Patients discharged the day of surgery will not be allowed to drive home.    Special instructions: review preparing for surgery  Please read over the following fact sheets that you were given. Pain Booklet, Coughing and Deep Breathing, Blood Transfusion Information, MRSA Information and Surgical Site Infection  Prevention

## 2015-01-17 ENCOUNTER — Encounter (HOSPITAL_COMMUNITY): Payer: Self-pay | Admitting: Emergency Medicine

## 2015-01-17 NOTE — Progress Notes (Signed)
Anesthesia Chart Review:  Pt is 75 year old female scheduled for L3-4, L4-5 PLIF on 01/20/2015 with Dr. Christella Noa.   Cardiologist is Dr. Fransico Him, next appt is 01/19/15.   PMH includes:  Chronic diastolic CHF, HTN, OSA (on CPAP), hyperlipidemia, primary hyperparathyroidism, thyroid disease, dementia, GERD. Former smoker. BMI 38. S/p R TKA 07/29/12. S/p neck exploration and L superior parathyroidectomy 06/16/12.   Medications include: lasix, pravastatin, valsartan.   Preoperative labs reviewed.    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 LV Ejection Fraction: 53%. LV Wall Motion: NL LV Function; NL Wall Motion  Echo 04/13/13:  - Left ventricle: Abnormal longitudinal strain The cavity size was moderately dilated. Wall thickness was normal. Systolic function was normal. The estimated ejection fraction was in the range of 50% to 55%. - Mitral valve: Mild regurgitation. - Left atrium: The atrium was moderately dilated. - Atrial septum: No defect or patent foramen ovale was identified.  Will revisit chart after pt's appt with Dr. Radford Pax 1/12.   Susan Cass, FNP-BC [facility]/Anesthesiology Phone: [phone] 01/17/2015 2:43 PM

## 2015-01-18 ENCOUNTER — Ambulatory Visit: Payer: PPO | Admitting: Cardiology

## 2015-01-19 ENCOUNTER — Ambulatory Visit (INDEPENDENT_AMBULATORY_CARE_PROVIDER_SITE_OTHER): Payer: PPO | Admitting: Cardiology

## 2015-01-19 ENCOUNTER — Encounter: Payer: Self-pay | Admitting: Cardiology

## 2015-01-19 VITALS — BP 150/78 | HR 68 | Ht 61.0 in | Wt 200.0 lb

## 2015-01-19 DIAGNOSIS — G4733 Obstructive sleep apnea (adult) (pediatric): Secondary | ICD-10-CM

## 2015-01-19 DIAGNOSIS — I5032 Chronic diastolic (congestive) heart failure: Secondary | ICD-10-CM

## 2015-01-19 DIAGNOSIS — E669 Obesity, unspecified: Secondary | ICD-10-CM

## 2015-01-19 DIAGNOSIS — I10 Essential (primary) hypertension: Secondary | ICD-10-CM

## 2015-01-19 NOTE — Patient Instructions (Signed)
Medication Instructions:  Your physician recommends that you continue on your current medications as directed. Please refer to the Current Medication list given to you today.   Labwork: None  Testing/Procedures: Dr. Radford Pax recommends you wear an ambulatory blood pressure cuff.  Follow-Up: Your physician wants you to follow-up in: 1 year with Dr. Radford Pax. You will receive a reminder letter in the mail two months in advance. If you don't receive a letter, please call our office to schedule the follow-up appointment.   Any Other Special Instructions Will Be Listed Below (If Applicable).     If you need a refill on your cardiac medications before your next appointment, please call your pharmacy.

## 2015-01-19 NOTE — Progress Notes (Signed)
Cardiology Office Note   Date:  01/19/2015   ID:  Susan [name], DOB [birth year], MRN [id number]  PCP:  Susan Casco, MD    Chief Complaint  Patient presents with  . Congestive Heart Failure  . Hypertension      History of Present Illness: Susan [name] is a 75 y.o. female with a history of OSA, HTN, obesity and chronic diastolic CHF. She denies chest pain or pressure and denies any SOB.  She denies any palpitations, dizziness but does have chronic LE edema which is stable.She tolerates her CPAP well. She tolerates the full face mask but only uses it every other night. She feels rested in the am and has no daytime sleepiness if she uses it the night before.    Past Medical History  Diagnosis Date  . Hyperlipidemia   . Pedal edema   . Spinal stenosis   . Varicose veins     "BLE" (07/29/2012)  . Primary hyperparathyroidism ([place])   . Thyroid disease   . Incontinent of urine     wears pads  . Family history of anesthesia complication     "some wake up during OR; some are hard to wake up; some both" (07/29/2012)  . OSA on CPAP   . Arthritis     "legs, back" (07/29/2012)  . Osteoarthritis of right knee   . Umbilical hernia     "unrepaired" (07/29/2012)  . Chronic lower back pain   . History of stomach ulcers 1980's  . Anxiety   . Depression   . GERD (gastroesophageal reflux disease)   . Hypertension   . Chronic diastolic CHF (congestive heart failure) ([place])   . Complication of anesthesia     "I have apnea" (07/29/2012)  . Dementia     per family    Past Surgical History  Procedure Laterality Date  . Iud removal  1980's  . Parathyroidectomy N/A 06/16/2012    Procedure: NECK EXPLORATION AND LEFT SUPERIOR PARATHYROIDECTOMY;  Surgeon: Earnstine Regal, MD;  Location: [facility];  Service: General;  Laterality: N/A;  . Colonoscopy    . 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 Cholecystectomy  ~ 2010  . Total knee arthroplasty Right 07/29/2012   Procedure: TOTAL KNEE ARTHROPLASTY;  Surgeon: Ninetta Lights, MD;  Location: [place];  Service: Orthopedics;  Laterality: Right;     Current Outpatient Prescriptions  Medication Sig Dispense Refill  . buPROPion (WELLBUTRIN XL) 300 MG 24 hr tablet Take 300 mg by mouth daily.  12  . cholecalciferol (VITAMIN D) 1000 units tablet Take 1,000 Units by mouth daily.    . furosemide (LASIX) 20 MG tablet Take 20 mg by mouth daily.  2  . gabapentin (NEURONTIN) 300 MG capsule Take 300 mg by mouth at bedtime.  3  . Multiple Vitamin (MULTIVITAMIN WITH MINERALS) TABS Take 1 tablet by mouth daily.    . pravastatin (PRAVACHOL) 20 MG tablet Take 20 mg by mouth every morning.    . valsartan (DIOVAN) 320 MG tablet TAKE 1 TABLET BY MOUTH EVERY DAY 30 tablet 11  . vitamin B-12 (CYANOCOBALAMIN) 100 MCG tablet Take 100 mcg by mouth daily.     No current facility-administered medications for this visit.    Allergies:   Lipitor; Simvastatin; and Ace inhibitors    Social History:  The patient  reports that she quit smoking about 37 years ago. Her smoking  use included Cigarettes. She has a 30 pack-year smoking history. She has never used smokeless tobacco. She reports that she does not drink alcohol or use illicit drugs.   Family History:  The patient's family history includes Hypertension in her mother; Lung cancer in her father.    ROS:  Please see the history of present illness.   Otherwise, review of systems are positive for none.   All other systems are reviewed and negative.    PHYSICAL EXAM: VS:  BP 150/78 mmHg  Pulse 68  Ht 5\' 1"  (1.549 m)  Wt 200 lb (90.719 kg)  BMI 37.81 kg/m2 , BMI Body mass index is 37.81 kg/(m^2). GEN: Well nourished, well developed, in no acute distress HEENT: normal Neck: no JVD, carotid bruits, or masses Cardiac: RRR; no murmurs, rubs, or gallops.  Trace edema Respiratory:  clear to auscultation bilaterally, normal work of breathing GI: soft, nontender, nondistended, +  BS MS: no deformity or atrophy Skin: warm and dry, no rash Neuro:  Strength and sensation are intact Psych: euthymic mood, full affect   EKG:  EKG is not ordered today.    Recent Labs: 01/16/2015: BUN 15; Creatinine, Ser 1.12*; Hemoglobin 12.8; Platelets 232; Potassium 4.1; Sodium 142    Lipid Panel No results found for: CHOL, TRIG, HDL, CHOLHDL, VLDL, LDLCALC, LDLDIRECT    Wt Readings from Last 3 Encounters:  01/19/15 200 lb (90.719 kg)  01/16/15 202 lb 8 oz (91.853 kg)  07/04/14 200 lb (90.719 kg)    ASSESSMENT AND PLAN:  1. Atypical Chest pain with no ischemia on nuclear stress test and low normal LVF on echo- no further CP 2. HTN - borderline controlled  - continue Diovan  - I am going to get a 24 hour ambulatory BP monitor to assess BP 3.Chronic diastolic CHF - She appears euvolemic on exam with only trace edema. Continue furosemide and ARB  4.OSA tolerating CPAP - I will get a d/l from her DME   Current medicines are reviewed at length with the patient today.  The patient does not have concerns regarding medicines.  The following changes have been made:  no change  Labs/ tests ordered today: See above Assessment and Plan No orders of the defined types were placed in this encounter.     Disposition:   FU with me in 1 year  Signed, Sueanne Margarita, MD  01/19/2015 1:12 PM    [facility] [address], [place], [place]  [address] Phone: [phone]; Fax: [phone]

## 2015-01-20 ENCOUNTER — Encounter (HOSPITAL_COMMUNITY): Admission: RE | Payer: Self-pay | Source: Ambulatory Visit

## 2015-01-20 ENCOUNTER — Inpatient Hospital Stay (HOSPITAL_COMMUNITY): Admission: RE | Admit: 2015-01-20 | Payer: PPO | Source: Ambulatory Visit | Admitting: Neurosurgery

## 2015-01-20 SURGERY — POSTERIOR LUMBAR FUSION 2 LEVEL
Anesthesia: General

## 2015-01-24 ENCOUNTER — Encounter: Payer: Self-pay | Admitting: Internal Medicine

## 2015-01-24 ENCOUNTER — Ambulatory Visit: Payer: PPO

## 2015-01-24 ENCOUNTER — Emergency Department (HOSPITAL_COMMUNITY): Payer: PPO

## 2015-01-24 ENCOUNTER — Other Ambulatory Visit: Payer: Self-pay

## 2015-01-24 ENCOUNTER — Telehealth: Payer: Self-pay

## 2015-01-24 ENCOUNTER — Observation Stay (HOSPITAL_BASED_OUTPATIENT_CLINIC_OR_DEPARTMENT_OTHER)
Admission: EM | Admit: 2015-01-24 | Discharge: 2015-01-26 | Disposition: A | Discharge status: Home / Self Care | Payer: PPO | Source: Home / Self Care | Attending: Family Medicine | Admitting: Family Medicine

## 2015-01-24 ENCOUNTER — Encounter (HOSPITAL_COMMUNITY): Payer: Self-pay | Admitting: Emergency Medicine

## 2015-01-24 DIAGNOSIS — R4781 Slurred speech: Secondary | ICD-10-CM

## 2015-01-24 DIAGNOSIS — F419 Anxiety disorder, unspecified: Secondary | ICD-10-CM | POA: Insufficient documentation

## 2015-01-24 DIAGNOSIS — F329 Major depressive disorder, single episode, unspecified: Secondary | ICD-10-CM

## 2015-01-24 DIAGNOSIS — K219 Gastro-esophageal reflux disease without esophagitis: Secondary | ICD-10-CM | POA: Diagnosis present

## 2015-01-24 DIAGNOSIS — I69322 Dysarthria following cerebral infarction: Secondary | ICD-10-CM | POA: Diagnosis not present

## 2015-01-24 DIAGNOSIS — I4891 Unspecified atrial fibrillation: Secondary | ICD-10-CM | POA: Insufficient documentation

## 2015-01-24 DIAGNOSIS — Z87891 Personal history of nicotine dependence: Secondary | ICD-10-CM | POA: Insufficient documentation

## 2015-01-24 DIAGNOSIS — I5033 Acute on chronic diastolic (congestive) heart failure: Secondary | ICD-10-CM | POA: Insufficient documentation

## 2015-01-24 DIAGNOSIS — G459 Transient cerebral ischemic attack, unspecified: Secondary | ICD-10-CM

## 2015-01-24 DIAGNOSIS — G4733 Obstructive sleep apnea (adult) (pediatric): Secondary | ICD-10-CM | POA: Insufficient documentation

## 2015-01-24 DIAGNOSIS — Z7982 Long term (current) use of aspirin: Secondary | ICD-10-CM

## 2015-01-24 DIAGNOSIS — E210 Primary hyperparathyroidism: Secondary | ICD-10-CM

## 2015-01-24 DIAGNOSIS — Z96651 Presence of right artificial knee joint: Secondary | ICD-10-CM | POA: Insufficient documentation

## 2015-01-24 DIAGNOSIS — I6602 Occlusion and stenosis of left middle cerebral artery: Secondary | ICD-10-CM | POA: Diagnosis not present

## 2015-01-24 DIAGNOSIS — R001 Bradycardia, unspecified: Secondary | ICD-10-CM | POA: Insufficient documentation

## 2015-01-24 DIAGNOSIS — F0390 Unspecified dementia without behavioral disturbance: Secondary | ICD-10-CM | POA: Insufficient documentation

## 2015-01-24 DIAGNOSIS — M79609 Pain in unspecified limb: Secondary | ICD-10-CM | POA: Diagnosis not present

## 2015-01-24 DIAGNOSIS — G629 Polyneuropathy, unspecified: Secondary | ICD-10-CM | POA: Diagnosis not present

## 2015-01-24 DIAGNOSIS — I5042 Chronic combined systolic (congestive) and diastolic (congestive) heart failure: Secondary | ICD-10-CM | POA: Diagnosis not present

## 2015-01-24 DIAGNOSIS — I480 Paroxysmal atrial fibrillation: Secondary | ICD-10-CM

## 2015-01-24 DIAGNOSIS — R29810 Facial weakness: Secondary | ICD-10-CM

## 2015-01-24 DIAGNOSIS — M4802 Spinal stenosis, cervical region: Secondary | ICD-10-CM

## 2015-01-24 DIAGNOSIS — I110 Hypertensive heart disease with heart failure: Secondary | ICD-10-CM | POA: Diagnosis not present

## 2015-01-24 DIAGNOSIS — I2510 Atherosclerotic heart disease of native coronary artery without angina pectoris: Secondary | ICD-10-CM | POA: Diagnosis not present

## 2015-01-24 DIAGNOSIS — R600 Localized edema: Secondary | ICD-10-CM | POA: Diagnosis not present

## 2015-01-24 DIAGNOSIS — I69351 Hemiplegia and hemiparesis following cerebral infarction affecting right dominant side: Secondary | ICD-10-CM | POA: Diagnosis not present

## 2015-01-24 DIAGNOSIS — R471 Dysarthria and anarthria: Secondary | ICD-10-CM | POA: Diagnosis present

## 2015-01-24 DIAGNOSIS — I6789 Other cerebrovascular disease: Secondary | ICD-10-CM | POA: Diagnosis not present

## 2015-01-24 DIAGNOSIS — G8929 Other chronic pain: Secondary | ICD-10-CM | POA: Insufficient documentation

## 2015-01-24 DIAGNOSIS — I119 Hypertensive heart disease without heart failure: Secondary | ICD-10-CM | POA: Diagnosis present

## 2015-01-24 DIAGNOSIS — R41841 Cognitive communication deficit: Secondary | ICD-10-CM | POA: Diagnosis not present

## 2015-01-24 DIAGNOSIS — E669 Obesity, unspecified: Secondary | ICD-10-CM | POA: Insufficient documentation

## 2015-01-24 DIAGNOSIS — I159 Secondary hypertension, unspecified: Secondary | ICD-10-CM

## 2015-01-24 DIAGNOSIS — I639 Cerebral infarction, unspecified: Secondary | ICD-10-CM | POA: Diagnosis not present

## 2015-01-24 DIAGNOSIS — R278 Other lack of coordination: Secondary | ICD-10-CM | POA: Diagnosis not present

## 2015-01-24 DIAGNOSIS — G8191 Hemiplegia, unspecified affecting right dominant side: Secondary | ICD-10-CM | POA: Diagnosis not present

## 2015-01-24 DIAGNOSIS — R269 Unspecified abnormalities of gait and mobility: Secondary | ICD-10-CM | POA: Diagnosis not present

## 2015-01-24 DIAGNOSIS — Z8673 Personal history of transient ischemic attack (TIA), and cerebral infarction without residual deficits: Secondary | ICD-10-CM | POA: Diagnosis not present

## 2015-01-24 DIAGNOSIS — E785 Hyperlipidemia, unspecified: Secondary | ICD-10-CM | POA: Insufficient documentation

## 2015-01-24 DIAGNOSIS — E039 Hypothyroidism, unspecified: Secondary | ICD-10-CM

## 2015-01-24 DIAGNOSIS — I10 Essential (primary) hypertension: Secondary | ICD-10-CM

## 2015-01-24 DIAGNOSIS — I5030 Unspecified diastolic (congestive) heart failure: Secondary | ICD-10-CM | POA: Diagnosis present

## 2015-01-24 DIAGNOSIS — R531 Weakness: Secondary | ICD-10-CM | POA: Insufficient documentation

## 2015-01-24 DIAGNOSIS — M545 Low back pain: Secondary | ICD-10-CM | POA: Insufficient documentation

## 2015-01-24 DIAGNOSIS — I4819 Other persistent atrial fibrillation: Secondary | ICD-10-CM | POA: Diagnosis not present

## 2015-01-24 DIAGNOSIS — Z79899 Other long term (current) drug therapy: Secondary | ICD-10-CM | POA: Diagnosis not present

## 2015-01-24 DIAGNOSIS — Z6838 Body mass index (BMI) 38.0-38.9, adult: Secondary | ICD-10-CM | POA: Diagnosis not present

## 2015-01-24 DIAGNOSIS — R29701 NIHSS score 1: Secondary | ICD-10-CM | POA: Diagnosis not present

## 2015-01-24 DIAGNOSIS — I255 Ischemic cardiomyopathy: Secondary | ICD-10-CM | POA: Diagnosis not present

## 2015-01-24 DIAGNOSIS — G451 Carotid artery syndrome (hemispheric): Secondary | ICD-10-CM | POA: Diagnosis not present

## 2015-01-24 DIAGNOSIS — M6281 Muscle weakness (generalized): Secondary | ICD-10-CM | POA: Diagnosis not present

## 2015-01-24 DIAGNOSIS — I420 Dilated cardiomyopathy: Secondary | ICD-10-CM | POA: Diagnosis not present

## 2015-01-24 DIAGNOSIS — I5032 Chronic diastolic (congestive) heart failure: Secondary | ICD-10-CM | POA: Diagnosis present

## 2015-01-24 DIAGNOSIS — R42 Dizziness and giddiness: Secondary | ICD-10-CM

## 2015-01-24 DIAGNOSIS — I69311 Memory deficit following cerebral infarction: Secondary | ICD-10-CM | POA: Diagnosis not present

## 2015-01-24 DIAGNOSIS — E869 Volume depletion, unspecified: Secondary | ICD-10-CM | POA: Diagnosis not present

## 2015-01-24 LAB — COMPREHENSIVE METABOLIC PANEL
ALT: 19 U/L (ref 14–54)
AST: 26 U/L (ref 15–41)
Albumin: 3.9 g/dL (ref 3.5–5.0)
Alkaline Phosphatase: 70 U/L (ref 38–126)
Anion gap: 12 (ref 5–15)
BUN: 17 mg/dL (ref 6–20)
CALCIUM: 10.6 mg/dL — AB (ref 8.9–10.3)
CHLORIDE: 107 mmol/L (ref 101–111)
CO2: 25 mmol/L (ref 22–32)
CREATININE: 1.05 mg/dL — AB (ref 0.44–1.00)
GFR, EST AFRICAN AMERICAN: 58 mL/min — AB (ref >60)
GFR, EST NON AFRICAN AMERICAN: 50 mL/min — AB (ref >60)
Glucose, Bld: 92 mg/dL (ref 65–99)
Potassium: 3.8 mmol/L (ref 3.5–5.1)
Sodium: 144 mmol/L (ref 135–145)
Total Bilirubin: 0.5 mg/dL (ref 0.3–1.2)
Total Protein: 6.7 g/dL (ref 6.5–8.1)

## 2015-01-24 LAB — RAPID URINE DRUG SCREEN, HOSP PERFORMED
Amphetamines: NOT DETECTED
BENZODIAZEPINES: NOT DETECTED
Barbiturates: NOT DETECTED
Cocaine: NOT DETECTED
Opiates: NOT DETECTED
Tetrahydrocannabinol: NOT DETECTED

## 2015-01-24 LAB — DIFFERENTIAL
BASOS PCT: 1 %
Basophils Absolute: 0 10*3/uL (ref 0.0–0.1)
Eosinophils Absolute: 0.1 10*3/uL (ref 0.0–0.7)
Eosinophils Relative: 3 %
Lymphocytes Relative: 30 %
Lymphs Abs: 1.2 10*3/uL (ref 0.7–4.0)
MONO ABS: 0.3 10*3/uL (ref 0.1–1.0)
MONOS PCT: 8 %
NEUTROS ABS: 2.3 10*3/uL (ref 1.7–7.7)
Neutrophils Relative %: 58 %

## 2015-01-24 LAB — URINE MICROSCOPIC-ADD ON: RBC / HPF: NONE SEEN RBC/hpf (ref 0–5)

## 2015-01-24 LAB — URINALYSIS, ROUTINE W REFLEX MICROSCOPIC
Bilirubin Urine: NEGATIVE
Glucose, UA: NEGATIVE mg/dL
Hgb urine dipstick: NEGATIVE
Ketones, ur: NEGATIVE mg/dL
NITRITE: NEGATIVE
Protein, ur: NEGATIVE mg/dL
SPECIFIC GRAVITY, URINE: 1.014 (ref 1.005–1.030)
pH: 6 (ref 5.0–8.0)

## 2015-01-24 LAB — CBC
HEMATOCRIT: 39.3 % (ref 36.0–46.0)
Hemoglobin: 13.5 g/dL (ref 12.0–15.0)
MCH: 31.8 pg (ref 26.0–34.0)
MCHC: 34.4 g/dL (ref 30.0–36.0)
MCV: 92.7 fL (ref 78.0–100.0)
Platelets: 217 10*3/uL (ref 150–400)
RBC: 4.24 MIL/uL (ref 3.87–5.11)
RDW: 12.9 % (ref 11.5–15.5)
WBC: 3.9 10*3/uL — ABNORMAL LOW (ref 4.0–10.5)

## 2015-01-24 LAB — I-STAT CHEM 8, ED
BUN: 19 mg/dL (ref 6–20)
CALCIUM ION: 1.25 mmol/L (ref 1.13–1.30)
Chloride: 106 mmol/L (ref 101–111)
Creatinine, Ser: 1 mg/dL (ref 0.44–1.00)
GLUCOSE: 91 mg/dL (ref 65–99)
HCT: 41 % (ref 36.0–46.0)
HEMOGLOBIN: 13.9 g/dL (ref 12.0–15.0)
Potassium: 3.7 mmol/L (ref 3.5–5.1)
Sodium: 143 mmol/L (ref 135–145)
TCO2: 24 mmol/L (ref 0–100)

## 2015-01-24 LAB — APTT: aPTT: 26 seconds (ref 24–37)

## 2015-01-24 LAB — PROTIME-INR
INR: 1.05 (ref 0.00–1.49)
Prothrombin Time: 13.9 seconds (ref 11.6–15.2)

## 2015-01-24 LAB — I-STAT TROPONIN, ED: TROPONIN I, POC: 0.02 ng/mL (ref 0.00–0.08)

## 2015-01-24 LAB — ETHANOL

## 2015-01-24 MED ORDER — ASPIRIN 325 MG PO TABS
325.0000 mg | ORAL_TABLET | Freq: Every day | ORAL | Status: DC
  Administered 2015-01-24 – 2015-01-26 (×3): 325 mg via ORAL
  Filled 2015-01-24 (×3): qty 1

## 2015-01-24 MED ORDER — IRBESARTAN 150 MG PO TABS
150.0000 mg | ORAL_TABLET | Freq: Every day | ORAL | Status: DC
  Administered 2015-01-25 – 2015-01-26 (×2): 150 mg via ORAL
  Filled 2015-01-24 (×2): qty 1

## 2015-01-24 NOTE — ED Notes (Signed)
Code Stroke EMS - Patient coming from PCP with LKN of today at 1250.  Deficits of right sided weakness, slurred speech, right drift and confusion.  Patient was unable to walk after arrival to the PCP.

## 2015-01-24 NOTE — ED Notes (Signed)
Report attempted, RN to call back. 

## 2015-01-24 NOTE — Addendum Note (Signed)
Addended by: Harland German A on: 01/24/2015 03:07 PM   Modules accepted: Orders

## 2015-01-24 NOTE — ED Notes (Signed)
Meal tray ordered for patient.

## 2015-01-24 NOTE — Consult Note (Signed)
Requesting Physician: ED MD    Chief Complaint: Code Stroke  HPI:                                                                                                                                         Susan [name] is an 75 y.o. female who was at her PCP office today and about to be brought back to a room when she suddenly noticed right sided weakness and slurred speech.  EMS was called and patient was brought to ED. On arrival her symptoms had almost fully resolved.   Date last known well: 1.17.2017 Time last known well: Time: 12:50 tPA Given: No: resolving symptoms     Past Medical History  Diagnosis Date  . Hyperlipidemia   . Pedal edema   . Spinal stenosis   . Varicose veins     "BLE" (07/29/2012)  . Primary hyperparathyroidism ([place])   . Thyroid disease   . Incontinent of urine     wears pads  . Family history of anesthesia complication     "some wake up during OR; some are hard to wake up; some both" (07/29/2012)  . OSA on CPAP   . Arthritis     "legs, back" (07/29/2012)  . Osteoarthritis of right knee   . Umbilical hernia     "unrepaired" (07/29/2012)  . Chronic lower back pain   . History of stomach ulcers 1980's  . Anxiety   . Depression   . GERD (gastroesophageal reflux disease)   . Hypertension   . Chronic diastolic CHF (congestive heart failure) ([place])   . Complication of anesthesia     "I have apnea" (07/29/2012)  . Dementia     per family    Past Surgical History  Procedure Laterality Date  . Iud removal  1980's  . Parathyroidectomy N/A 06/16/2012    Procedure: NECK EXPLORATION AND LEFT SUPERIOR PARATHYROIDECTOMY;  Surgeon: Earnstine Regal, MD;  Location: [facility];  Service: General;  Laterality: N/A;  . Colonoscopy    . 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 Cholecystectomy  ~ 2010  . Total knee arthroplasty Right 07/29/2012    Procedure: TOTAL KNEE ARTHROPLASTY;  Surgeon: Ninetta Lights, MD;  Location: [place];  Service: Orthopedics;  Laterality: Right;     Family History  Problem Relation Age of Onset  . Hypertension Mother     deceased  . Lung cancer Father     deceased   Social History:  reports that she quit smoking about 37 years ago. Her smoking use included Cigarettes. She has a 30 pack-year smoking history. She has never used smokeless tobacco. She reports that she does not drink alcohol or use illicit drugs.  Allergies:  Allergies  Allergen Reactions  . Lipitor [Atorvastatin] Swelling  . Simvastatin Other (See Comments)    Myalgias     . Ace Inhibitors Cough  Medications:                                                                                                                           No current facility-administered medications for this encounter.   Current Outpatient Prescriptions  Medication Sig Dispense Refill  . buPROPion (WELLBUTRIN XL) 300 MG 24 hr tablet Take 300 mg by mouth daily.  12  . cholecalciferol (VITAMIN D) 1000 units tablet Take 1,000 Units by mouth daily.    . furosemide (LASIX) 20 MG tablet Take 20 mg by mouth daily.  2  . gabapentin (NEURONTIN) 300 MG capsule Take 300 mg by mouth at bedtime.  3  . Multiple Vitamin (MULTIVITAMIN WITH MINERALS) TABS Take 1 tablet by mouth daily.    . pravastatin (PRAVACHOL) 20 MG tablet Take 20 mg by mouth every morning.    . valsartan (DIOVAN) 320 MG tablet TAKE 1 TABLET BY MOUTH EVERY DAY 30 tablet 11  . vitamin B-12 (CYANOCOBALAMIN) 100 MCG tablet Take 100 mcg by mouth daily.       ROS:                                                                                                                                       History obtained from the patient  General ROS: negative for - chills, fatigue, fever, night sweats, weight gain or weight loss Psychological ROS: negative for - behavioral disorder, hallucinations, memory difficulties, mood swings or suicidal ideation Ophthalmic ROS: negative for - blurry vision, double vision, eye pain or loss of  vision ENT ROS: negative for - epistaxis, nasal discharge, oral lesions, sore throat, tinnitus or vertigo Allergy and Immunology ROS: negative for - hives or itchy/watery eyes Hematological and Lymphatic ROS: negative for - bleeding problems, bruising or swollen lymph nodes Endocrine ROS: negative for - galactorrhea, hair pattern changes, polydipsia/polyuria or temperature intolerance Respiratory ROS: negative for - cough, hemoptysis, shortness of breath or wheezing Cardiovascular ROS: negative for - chest pain, dyspnea on exertion, edema or irregular heartbeat Gastrointestinal ROS: negative for - abdominal pain, diarrhea, hematemesis, nausea/vomiting or stool incontinence Genito-Urinary ROS: negative for - dysuria, hematuria, incontinence or urinary frequency/urgency Musculoskeletal ROS: negative for - joint swelling or muscular weakness Neurological ROS: as noted in HPI Dermatological ROS: negative for rash and skin lesion changes  Neurologic Examination:  Height 5' 1.02" (1.55 m), weight 92.5 kg (203 lb 14.8 oz), SpO2 96 %.  HEENT-  Normocephalic, no lesions, without obvious abnormality.  Normal external eye and conjunctiva.  Normal TM's bilaterally.  Normal auditory canals and external ears. Normal external nose, mucus membranes and septum.  Normal pharynx. Cardiovascular- S1, S2 normal, pulses palpable throughout   Lungs- chest clear, no wheezing, rales, normal symmetric air entry Abdomen- normal findings: bowel sounds normal Extremities- no edema Lymph-no adenopathy palpable Musculoskeletal-no joint tenderness, deformity or swelling Skin-warm and dry, no hyperpigmentation, vitiligo, or suspicious lesions  Neurological Examination Mental Status: Alert, oriented, thought content appropriate.  Speech dysarthric  without evidence of aphasia.  Able to follow 3 step commands without  difficulty. Cranial Nerves: II: Discs flat bilaterally; Visual fields grossly normal, pupils equal, round, reactive to light and accommodation III,IV, VI: ptosis not present, extra-ocular motions intact bilaterally V,VII: smile symmetric, facial light touch sensation normal bilaterally VIII: hearing normal bilaterally IX,X: uvula rises symmetrically XI: bilateral shoulder shrug XII: midline tongue extension Motor: Right : Upper extremity   5/5    Left:     Upper extremity   5/5  Lower extremity   5/5     Lower extremity   5/5 Tone and bulk:normal tone throughout; no atrophy noted Sensory: Pinprick and light touch intact throughout, bilaterally Deep Tendon Reflexes: 2+ and symmetric throughout Plantars: Right: downgoing   Left: downgoing Cerebellar: normal finger-to-nose, normal rapid alternating movements and normal heel-to-shin test Gait: not tested       Lab Results: Basic Metabolic Panel:  Recent Labs Lab 01/24/15 1343  NA 143  K 3.7  CL 106  GLUCOSE 91  BUN 19  CREATININE 1.00    Liver Function Tests: No results for input(s): AST, ALT, ALKPHOS, BILITOT, PROT, ALBUMIN in the last 168 hours. No results for input(s): LIPASE, AMYLASE in the last 168 hours. No results for input(s): AMMONIA in the last 168 hours.  CBC:  Recent Labs Lab 01/24/15 1337 01/24/15 1343  WBC 3.9*  --   NEUTROABS 2.3  --   HGB 13.5 13.9  HCT 39.3 41.0  MCV 92.7  --   PLT 217  --     Cardiac Enzymes: No results for input(s): CKTOTAL, CKMB, CKMBINDEX, TROPONINI in the last 168 hours.  Lipid Panel: No results for input(s): CHOL, TRIG, HDL, CHOLHDL, VLDL, LDLCALC in the last 168 hours.  CBG: No results for input(s): GLUCAP in the last 168 hours.  Microbiology: Results for orders placed or performed during the hospital encounter of 01/16/15  Surgical pcr screen     Status: None   Collection Time: 01/16/15 12:51 PM  Result Value Ref Range Status   MRSA, PCR NEGATIVE NEGATIVE  Final   Staphylococcus aureus NEGATIVE NEGATIVE Final    Comment:        The Xpert SA Assay (FDA approved for NASAL specimens in patients over 21 years of age), is one component of a comprehensive surveillance program.  Test performance has been validated by [facility] for patients greater than or equal to 90 year old. It is not intended to diagnose infection nor to guide or monitor treatment.     Coagulation Studies: No results for input(s): LABPROT, INR in the last 72 hours.  Imaging: No results found.  Assessment and plan discussed with with attending physician and they are in agreement.    Etta Quill PA-C [facility] [phone]  01/24/2015, 1:53 PM   Assessment: 75 y.o. female with transient  dysarthria, right sided weakness which has resolved. Likely TIA/CVA but not a tPA candidate due to NIHSS 1 and resolving symptoms. Will keep in TIA window.  Complete stroke work up. Stroke team will follow up tomorrow.  Stroke Risk Factors -age, CHF, hyperlipidemia and hypertension  Recommend: 1. HgbA1c, fasting lipid panel 2. MRI, MRA  of the brain without contrast 3. PT consult, OT consult, Speech consult 4. Echocardiogram 5. Carotid dopplers 6. Prophylactic therapy-Antiplatelet med: Aspirin - dose 325 mg daily 7. Risk factor modification 8. Telemetry monitoring 9. Frequent neuro checks 10 NPO until passes stroke swallow screen 11 please page stroke NP  Or  PA  Or MD  M-F from 8am -4 pm starting 1.18.2017 as this followed by the stroke team at this point.   You can look them up on [email or web address]  Password [email or web address]   Patient seen and examined together with physician assistant and I concur with the assessment and plan.  Dorian Pod, MD

## 2015-01-24 NOTE — ED Provider Notes (Signed)
CSN: [id number]     Arrival date & time 01/24/15  1330 History   First MD Initiated Contact with Patient 01/24/15 1359     Chief Complaint  Patient presents with  . Code Stroke    An emergency department physician performed an initial assessment on this suspected stroke patient at 1330. (Consider location/radiation/quality/duration/timing/severity/associated sxs/prior Treatment) Patient is a 75 y.o. female presenting with extremity weakness.  Extremity Weakness This is a new problem. The current episode started 1 to 2 hours ago. The problem occurs constantly. The problem has been gradually improving. Pertinent negatives include no chest pain and no shortness of breath. Nothing aggravates the symptoms. Nothing relieves the symptoms. She has tried nothing for the symptoms. The treatment provided no relief.    Past Medical History  Diagnosis Date  . Hyperlipidemia   . Pedal edema   . Spinal stenosis   . Varicose veins     "BLE" (07/29/2012)  . Primary hyperparathyroidism ([place])   . Thyroid disease   . Incontinent of urine     wears pads  . Family history of anesthesia complication     "some wake up during OR; some are hard to wake up; some both" (07/29/2012)  . OSA on CPAP   . Arthritis     "legs, back" (07/29/2012)  . Osteoarthritis of right knee   . Umbilical hernia     "unrepaired" (07/29/2012)  . Chronic lower back pain   . History of stomach ulcers 1980's  . Anxiety   . Depression   . GERD (gastroesophageal reflux disease)   . Hypertension   . Chronic diastolic CHF (congestive heart failure) ([place])   . Complication of anesthesia     "I have apnea" (07/29/2012)  . Dementia     per family   Past Surgical History  Procedure Laterality Date  . Iud removal  1980's  . Parathyroidectomy N/A 06/16/2012    Procedure: NECK EXPLORATION AND LEFT SUPERIOR PARATHYROIDECTOMY;  Surgeon: Earnstine Regal, MD;  Location: [facility];  Service: General;  Laterality: N/A;  . Colonoscopy    . Total  knee arthroplasty Right 07/29/2012  . Cholecystectomy  ~ 2010  . Total knee arthroplasty Right 07/29/2012    Procedure: TOTAL KNEE ARTHROPLASTY;  Surgeon: Ninetta Lights, MD;  Location: [facility];  Service: Orthopedics;  Laterality: Right;   Family History  Problem Relation Age of Onset  . Hypertension Mother     deceased  . Lung cancer Father     deceased   Social History  Substance Use Topics  . Smoking status: Former Smoker -- 1.00 packs/day for 30 years    Types: Cigarettes    Quit date: 01/07/1978  . Smokeless tobacco: Never Used  . Alcohol Use: No   OB History    No data available     Review of Systems  Constitutional: Negative for fever and chills.  Respiratory: Negative for shortness of breath.   Cardiovascular: Negative for chest pain.  Musculoskeletal: Positive for extremity weakness.  Neurological: Positive for dizziness, speech difficulty and weakness. Negative for syncope.  All other systems reviewed and are negative.     Allergies  Lipitor; Simvastatin; and Ace inhibitors  Home Medications   Prior to Admission medications   Medication Sig Start Date End Date Taking? Authorizing Provider  buPROPion (WELLBUTRIN XL) 300 MG 24 hr tablet Take 300 mg by mouth daily. 06/03/14  Yes Historical Provider, MD  cholecalciferol (VITAMIN D) 1000 units tablet Take 1,000 Units by mouth  daily.   Yes Historical Provider, MD  furosemide (LASIX) 20 MG tablet Take 20 mg by mouth daily. 11/07/14  Yes Historical Provider, MD  gabapentin (NEURONTIN) 300 MG capsule Take 300 mg by mouth at bedtime. 04/12/14  Yes Historical Provider, MD  Multiple Vitamin (MULTIVITAMIN WITH MINERALS) TABS Take 1 tablet by mouth daily.   Yes Historical Provider, MD  pravastatin (PRAVACHOL) 20 MG tablet Take 20 mg by mouth every morning. 07/21/13  Yes Historical Provider, MD  valsartan (DIOVAN) 320 MG tablet TAKE 1 TABLET BY MOUTH EVERY DAY 10/21/14  Yes Sueanne Margarita, MD  vitamin B-12 (CYANOCOBALAMIN) 100  MCG tablet Take 100 mcg by mouth daily.   Yes Historical Provider, MD   BP 164/105 mmHg  Pulse 61  Temp(Src) 98.3 F (36.8 C) (Oral)  Resp 13  Ht 5' 1.02" (1.55 m)  Wt 203 lb 14.8 oz (92.5 kg)  BMI 38.50 kg/m2  SpO2 99% Physical Exam  Constitutional: She is oriented to person, place, and time. She appears well-developed and well-nourished.  HENT:  Head: Normocephalic and atraumatic.  Neck: Normal range of motion.  Cardiovascular: Normal rate and regular rhythm.   Pulmonary/Chest: Effort normal and breath sounds normal. No stridor. No respiratory distress.  Abdominal: Soft. She exhibits no distension. There is no tenderness.  Musculoskeletal: She exhibits no edema or tenderness.  Neurological: She is alert and oriented to person, place, and time. She displays normal reflexes. No cranial nerve deficit. Coordination normal.  Skin: Skin is warm and dry.  Nursing note and vitals reviewed.   ED Course  Procedures (including critical care time)  CRITICAL CARE Performed by: Merrily Pew  Total critical care time: 30 minutes Critical care time was exclusive of separately billable procedures and treating other patients. Critical care was necessary to treat or prevent imminent or life-threatening deterioration. Critical care was time spent personally by me on the following activities: development of treatment plan with patient and/or surrogate as well as nursing, discussions with consultants, evaluation of patient's response to treatment, examination of patient, obtaining history from patient or surrogate, ordering and performing treatments and interventions, ordering and review of laboratory studies, ordering and review of radiographic studies, pulse oximetry and re-evaluation of patient's condition.   Labs Review Labs Reviewed  CBC - Abnormal; Notable for the following:    WBC 3.9 (*)    All other components within normal limits  COMPREHENSIVE METABOLIC PANEL - Abnormal; Notable for  the following:    Creatinine, Ser 1.05 (*)    Calcium 10.6 (*)    GFR calc non Af Amer 50 (*)    GFR calc Af Amer 58 (*)    All other components within normal limits  ETHANOL  PROTIME-INR  APTT  DIFFERENTIAL  URINE RAPID DRUG SCREEN, HOSP PERFORMED  URINALYSIS, ROUTINE W REFLEX MICROSCOPIC (NOT AT [facility])  I-STAT CHEM 8, ED  I-STAT TROPOININ, ED    Imaging Review Ct Head Wo Contrast  01/24/2015  ADDENDUM REPORT: 01/24/2015 13:55 ADDENDUM: Critical Value/emergent results were called by telephone at the time of interpretation on 01/24/2015 at 1:54 pm to Dr. Aram Beecham, neurology , who verbally acknowledged these results. Electronically Signed   By: Lowella Grip III M.D.   On: 01/24/2015 13:55  01/24/2015  CLINICAL DATA:  Acute onset right-sided weakness and facial droop EXAM: CT HEAD WITHOUT CONTRAST TECHNIQUE: Contiguous axial images were obtained from the base of the skull through the vertex without intravenous contrast. COMPARISON:  April 24, 2011 FINDINGS: Mild diffuse atrophy  is stable. There is no intracranial mass, hemorrhage, extra-axial fluid collection, or midline shift. There is mild small vessel disease in the centra semiovale bilaterally. Elsewhere gray-white compartments appear normal. No acute infarct identified. Middle cerebral artery attenuation is symmetric and normal bilaterally. Bony calvarium appears intact. Visualized mastoid air cells are clear. There is opacification in several ethmoid air cells. No intraorbital lesions are appreciable. IMPRESSION: Stable atrophy with patchy periventricular small vessel disease. No intracranial mass, hemorrhage, or acute appearing infarct. There is opacification of several left-sided ethmoid air cells. Electronically Signed: By: Lowella Grip III M.D. On: 01/24/2015 13:52   I have personally reviewed and evaluated these images and lab results as part of my medical decision-making.   EKG Interpretation   Date/Time:  Tuesday January 24 2015 13:50:42 EST Ventricular Rate:  67 PR Interval:  168 QRS Duration: 120 QT Interval:  383 QTC Calculation: 404 R Axis:   -67 Text Interpretation:  Sinus rhythm Right atrial enlargement Left anterior  fascicular block Left ventricular hypertrophy Nonspecific T abnormalities,  lateral leads Baseline wander in lead(s) II III aVF No significant change  since last tracing on 01/16/2015 Confirmed by [facility] MD, Corene Cornea [phone]) on  01/24/2015 1:59:38 PM      MDM   Final diagnoses:  Transient cerebral ischemia, unspecified transient cerebral ischemia type  Secondary hypertension, unspecified   75 year old female presents to the emergency department as active ache or stroke secondary to right-sided weakness and aphasia that started at 1250 at the doctor's office. On arrival here patient with a relatively normal neurologic exam. Considered TPA as her onset was within a couple hours of arrival however patient without significant deficits deferred to CT scan and neurology. Patient was rushed back to CT scan were no acute abnormalities were seen. Neurology was at bedside and patient symptoms are resolved. Said not to give TPA secondary to resolution. Patient without any other symptoms at this time. Likely here with a TIA, so we will admit to [facility] for continued workup and management.     Merrily Pew, MD 01/25/15 [phone]

## 2015-01-24 NOTE — H&P (Signed)
[facility] History and Physical  Susan [name] [id number] DOB: August 30, 1940 DOA: 01/24/2015  Referring physician: Dayna Barker PCP: Susan Casco, MD   Chief Complaint: right sided weakness, slurred speech unsteady gait  HPI: Susan [name] is a very pleasant 75 y.o. female past medical history includes, hyperlipidemia, thyroid disease, chronic low back pain, anxiety, GERD, hypertension, chronic diastolic heart failure presents to the emergency department with chief complaint of sudden right-sided weakness slurred speech and unsteady gait. Symptoms resolved at the time of presentation concerning for TIA.  Patient reports going to heart care office to pick up ambulatory blood pressure monitor. She developed sudden onset of dizziness and right-sided weakness. Chart review indicates she was placed in wheelchair she was unable to ambulate. They also noted slurred speech and right arm drift. That time she complained of numbness in her tongue. She denies chest pain palpitations headache dizziness syncope or near-syncope. She denies any abdominal pain nausea vomiting diaphoresis shortness of breath. Denies any recent illness unintentional weight loss subjective fever. Time she arrived to the emergency department symptoms were resolved.  In the emergency department code stroke was activated she was evaluated by neurology who opined likely TIA/CVA but not a TPA candidate due to and NIHSS 1 with resolving symptoms.   Review of Systems:  10 point review of systems complete and all systems are negative except as indicated in the history of present illness Past Medical History  Diagnosis Date  . Hyperlipidemia   . Pedal edema   . Spinal stenosis   . Varicose veins     "BLE" (07/29/2012)  . Primary hyperparathyroidism ([place])   . Thyroid disease   . Incontinent of urine     wears pads  . Family history of anesthesia complication     "some wake up during OR; some are hard to wake up; some both"  (07/29/2012)  . OSA on CPAP   . Arthritis     "legs, back" (07/29/2012)  . Osteoarthritis of right knee   . Umbilical hernia     "unrepaired" (07/29/2012)  . Chronic lower back pain   . History of stomach ulcers 1980's  . Anxiety   . Depression   . GERD (gastroesophageal reflux disease)   . Hypertension   . Chronic diastolic CHF (congestive heart failure) ([place])   . Complication of anesthesia     "I have apnea" (07/29/2012)  . Dementia     per family   Past Surgical History  Procedure Laterality Date  . Iud removal  1980's  . Parathyroidectomy N/A 06/16/2012    Procedure: NECK EXPLORATION AND LEFT SUPERIOR PARATHYROIDECTOMY;  Surgeon: Earnstine Regal, MD;  Location: [facility];  Service: General;  Laterality: N/A;  . Colonoscopy    . 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 Cholecystectomy  ~ 2010  . Total knee arthroplasty Right 07/29/2012    Procedure: TOTAL KNEE ARTHROPLASTY;  Surgeon: Ninetta Lights, MD;  Location: [facility];  Service: Orthopedics;  Laterality: Right;   Social History:  reports that she quit smoking about 37 years ago. Her smoking use included Cigarettes. She has a 30 pack-year smoking history. She has never used smokeless tobacco. She reports that she does not drink alcohol or use illicit drugs. Lives in independent living ambulates with a cane no recent falls Allergies  Allergen Reactions  . Lipitor [Atorvastatin] Swelling  . Simvastatin Other (See Comments)    Myalgias     . Ace Inhibitors Cough    Family History  Problem Relation Age of Onset  . Hypertension Mother     deceased  . Lung cancer Father     deceased   do not leave blank  Prior to Admission medications   Medication Sig Start Date End Date Taking? Authorizing Provider  buPROPion (WELLBUTRIN XL) 300 MG 24 hr tablet Take 300 mg by mouth daily. 06/03/14  Yes Historical Provider, MD  cholecalciferol (VITAMIN D) 1000 units tablet Take 1,000 Units by mouth daily.   Yes Historical Provider, MD    furosemide (LASIX) 20 MG tablet Take 20 mg by mouth daily. 11/07/14  Yes Historical Provider, MD  gabapentin (NEURONTIN) 300 MG capsule Take 300 mg by mouth at bedtime. 04/12/14  Yes Historical Provider, MD  Multiple Vitamin (MULTIVITAMIN WITH MINERALS) TABS Take 1 tablet by mouth daily.   Yes Historical Provider, MD  pravastatin (PRAVACHOL) 20 MG tablet Take 20 mg by mouth every morning. 07/21/13  Yes Historical Provider, MD  valsartan (DIOVAN) 320 MG tablet TAKE 1 TABLET BY MOUTH EVERY DAY 10/21/14  Yes Sueanne Margarita, MD  vitamin B-12 (CYANOCOBALAMIN) 100 MCG tablet Take 100 mcg by mouth daily.   Yes Historical Provider, MD   Physical Exam: Filed Vitals:   01/24/15 1415 01/24/15 1430 01/24/15 1445 01/24/15 1500  BP: 154/112 158/89 169/77 164/105  Pulse: 69 65 64 61  Temp:      TempSrc:      Resp: 15 19 18 13   Height:      Weight:      SpO2: 98% 99% 97% 99%    Wt Readings from Last 3 Encounters:  01/24/15 92.5 kg (203 lb 14.8 oz)  01/19/15 90.719 kg (200 lb)  01/16/15 91.853 kg (202 lb 8 oz)    General:  Appears calm and comfortable Eyes: PERRL, normal lids, irises & conjunctiva ENT: grossly normal hearing, lips & tongue, mucous membranes are her mouth are moist and pink Neck: no LAD, masses or thyromegaly Cardiovascular: RRR, no m/r/g. Trace LE edema.  Respiratory: CTA bilaterally, no w/r/r. Normal respiratory effort. Abdomen: soft, ntnd positive bowel sounds Skin: no rash or induration seen on limited exam Musculoskeletal: grossly normal tone BUE/BLE joints without swelling/erythema Psychiatric: grossly normal mood and affect, speech fluent and appropriate Neurologic: grossly non-focal. Alert and oriented 3 speech clear but slow, some word searching, bilateral grip 5 out of 5 lower extremity strength 5 out of 5 no pronator drift           Labs on Admission:  Basic Metabolic Panel:  Recent Labs Lab 01/24/15 1337 01/24/15 1343  NA 144 143  K 3.8 3.7  CL 107 106   CO2 25  --   GLUCOSE 92 91  BUN 17 19  CREATININE 1.05* 1.00  CALCIUM 10.6*  --    Liver Function Tests:  Recent Labs Lab 01/24/15 1337  AST 26  ALT 19  ALKPHOS 70  BILITOT 0.5  PROT 6.7  ALBUMIN 3.9   No results for input(s): LIPASE, AMYLASE in the last 168 hours. No results for input(s): AMMONIA in the last 168 hours. CBC:  Recent Labs Lab 01/24/15 1337 01/24/15 1343  WBC 3.9*  --   NEUTROABS 2.3  --   HGB 13.5 13.9  HCT 39.3 41.0  MCV 92.7  --   PLT 217  --    Cardiac Enzymes: No results for input(s): CKTOTAL, CKMB, CKMBINDEX, TROPONINI in the last 168 hours.  BNP (last 3 results) No results for input(s): BNP in the last 8760  hours.  ProBNP (last 3 results) No results for input(s): PROBNP in the last 8760 hours.  CBG: No results for input(s): GLUCAP in the last 168 hours.  Radiological Exams on Admission: Ct Head Wo Contrast  01/24/2015  ADDENDUM REPORT: 01/24/2015 13:55 ADDENDUM: Critical Value/emergent results were called by telephone at the time of interpretation on 01/24/2015 at 1:54 pm to Dr. Aram Beecham, neurology , who verbally acknowledged these results. Electronically Signed   By: Lowella Grip III M.D.   On: 01/24/2015 13:55  01/24/2015  CLINICAL DATA:  Acute onset right-sided weakness and facial droop EXAM: CT HEAD WITHOUT CONTRAST TECHNIQUE: Contiguous axial images were obtained from the base of the skull through the vertex without intravenous contrast. COMPARISON:  April 24, 2011 FINDINGS: Mild diffuse atrophy is stable. There is no intracranial mass, hemorrhage, extra-axial fluid collection, or midline shift. There is mild small vessel disease in the centra semiovale bilaterally. Elsewhere gray-white compartments appear normal. No acute infarct identified. Middle cerebral artery attenuation is symmetric and normal bilaterally. Bony calvarium appears intact. Visualized mastoid air cells are clear. There is opacification in several ethmoid air cells. No  intraorbital lesions are appreciable. IMPRESSION: Stable atrophy with patchy periventricular small vessel disease. No intracranial mass, hemorrhage, or acute appearing infarct. There is opacification of several left-sided ethmoid air cells. Electronically Signed: By: Lowella Grip III M.D. On: 01/24/2015 13:52    EKG: Independently reviewed. Sinus rhythm with left axis deviation  Assessment/Plan Principal Problem:   TIA (transient ischemic attack) Active Problems:   OSA (obstructive sleep apnea)   Anxiety   GERD (gastroesophageal reflux disease)   Hypertension   Obesity (BMI 30-39.9)   Chronic diastolic CHF (congestive heart failure) ([place])  #1. TIA. CT without any acute abnormalities. Risk factors include hyperlipidemia and hypertension. Symptoms resolved. Evaluated by neurology in the emergency department who opined not TPA candidate -Admit to telemetry -Obtain hemoglobin A1c and lipid panel -Obtain MRI/MRA of the brain -Carotid Dopplers and echocardiogram -Nothing by mouth until passes bedside swallow eval then heart healthy diet -325 mg of aspirin -Continue pravachol -PT and OT -To be followed by neuro  #2. Hypertension. Blood pressure 164/105 in the ED. Home medications include Lasix, Diovan. -We will hold Lasix -Continue Diovan with parameters  #3. Chronic diastolic heart failure. Currently compensated. Echocardiogram done in April 2015 reveals EF of 50-55% -Hold home Lasix for now -Obtain daily weights -Monitor intake and output  #4. Obstructive sleep apnea. She denies wearing sleep apnea. -Respiratory therapy consult  #5. Anxiety. Appears stable at baseline -Continue his home meds   Code Status: full DVT Prophylaxis: Family Communication: daughters at bedside Disposition Plan: home hopefully tomorrow  Time spent: 80 minutes  [facility]

## 2015-01-24 NOTE — ED Notes (Signed)
Pt given sprite 

## 2015-01-24 NOTE — Telephone Encounter (Signed)
Patient came in to pick up ambulatory BP monitor. Upon entrance to office at 1250, patient st she had sudden onset of dizziness and R sided weakness.  Patient was placed in wheelchair and taken to exam room. She was no longer able to walk - even with her cane. Patient developed slurred speech and R arm drift. She complained of numbness in tongue and L hand.  BP 196/100 HR 81 Called 911 for emergent dispatch for possible code stroke.  Patient picked up by EMS and taken to [facility] for evaluation around 1310.   Per patient request, contacted her daughters, Sharee Pimple and Geraldo Pitter for an update. Notified [place] patient will be at the hospital for an undefined about of time.

## 2015-01-25 ENCOUNTER — Observation Stay (HOSPITAL_COMMUNITY): Payer: PPO

## 2015-01-25 ENCOUNTER — Encounter (HOSPITAL_COMMUNITY): Payer: Self-pay | Admitting: Radiology

## 2015-01-25 ENCOUNTER — Observation Stay (HOSPITAL_BASED_OUTPATIENT_CLINIC_OR_DEPARTMENT_OTHER): Payer: PPO

## 2015-01-25 DIAGNOSIS — G459 Transient cerebral ischemic attack, unspecified: Secondary | ICD-10-CM | POA: Diagnosis not present

## 2015-01-25 DIAGNOSIS — E669 Obesity, unspecified: Secondary | ICD-10-CM

## 2015-01-25 DIAGNOSIS — R531 Weakness: Secondary | ICD-10-CM | POA: Diagnosis not present

## 2015-01-25 DIAGNOSIS — G451 Carotid artery syndrome (hemispheric): Secondary | ICD-10-CM

## 2015-01-25 DIAGNOSIS — K219 Gastro-esophageal reflux disease without esophagitis: Secondary | ICD-10-CM | POA: Diagnosis not present

## 2015-01-25 DIAGNOSIS — F419 Anxiety disorder, unspecified: Secondary | ICD-10-CM

## 2015-01-25 DIAGNOSIS — I5032 Chronic diastolic (congestive) heart failure: Secondary | ICD-10-CM | POA: Diagnosis not present

## 2015-01-25 DIAGNOSIS — I6602 Occlusion and stenosis of left middle cerebral artery: Secondary | ICD-10-CM | POA: Diagnosis not present

## 2015-01-25 DIAGNOSIS — I420 Dilated cardiomyopathy: Secondary | ICD-10-CM | POA: Diagnosis not present

## 2015-01-25 LAB — LIPID PANEL
CHOL/HDL RATIO: 4 ratio
CHOLESTEROL: 204 mg/dL — AB (ref 0–200)
HDL: 51 mg/dL (ref >40)
LDL Cholesterol: 122 mg/dL — ABNORMAL HIGH (ref 0–99)
TRIGLYCERIDES: 156 mg/dL — AB (ref <150)
VLDL: 31 mg/dL (ref 0–40)

## 2015-01-25 MED ORDER — STROKE: EARLY STAGES OF RECOVERY BOOK
Freq: Once | Status: DC
  Filled 2015-01-25: qty 1

## 2015-01-25 MED ORDER — EZETIMIBE 10 MG PO TABS
10.0000 mg | ORAL_TABLET | Freq: Every day | ORAL | Status: DC
  Administered 2015-01-25 – 2015-01-26 (×2): 10 mg via ORAL
  Filled 2015-01-25 (×2): qty 1

## 2015-01-25 MED ORDER — SENNOSIDES-DOCUSATE SODIUM 8.6-50 MG PO TABS: 1.0000 | ORAL_TABLET | Freq: Every evening | ORAL | Status: DC | PRN

## 2015-01-25 MED ORDER — GABAPENTIN 300 MG PO CAPS
300.0000 mg | ORAL_CAPSULE | Freq: Every day | ORAL | Status: DC
  Administered 2015-01-25: 300 mg via ORAL
  Filled 2015-01-25: qty 1

## 2015-01-25 MED ORDER — PRAVASTATIN SODIUM 40 MG PO TABS
40.0000 mg | ORAL_TABLET | Freq: Every day | ORAL | Status: DC
  Administered 2015-01-26: 40 mg via ORAL
  Filled 2015-01-25: qty 1

## 2015-01-25 MED ORDER — ENOXAPARIN SODIUM 30 MG/0.3ML ~~LOC~~ SOLN
30.0000 mg | SUBCUTANEOUS | Status: DC
  Administered 2015-01-25 – 2015-01-26 (×2): 30 mg via SUBCUTANEOUS
  Filled 2015-01-25 (×2): qty 0.3

## 2015-01-25 MED ORDER — EZETIMIBE 10 MG PO TABS: 10.0000 mg | ORAL_TABLET | Freq: Every day | ORAL | Status: DC

## 2015-01-25 MED ORDER — HYDRALAZINE HCL 20 MG/ML IJ SOLN: 5.0000 mg | INTRAMUSCULAR | Status: DC | PRN

## 2015-01-25 MED ORDER — VITAMIN B-12 100 MCG PO TABS
100.0000 ug | ORAL_TABLET | Freq: Every day | ORAL | Status: DC
  Administered 2015-01-25 – 2015-01-26 (×2): 100 ug via ORAL
  Filled 2015-01-25 (×2): qty 1

## 2015-01-25 MED ORDER — PRAVASTATIN SODIUM 20 MG PO TABS
20.0000 mg | ORAL_TABLET | Freq: Every morning | ORAL | Status: DC
  Administered 2015-01-25: 20 mg via ORAL
  Filled 2015-01-25: qty 1

## 2015-01-25 MED ORDER — ASPIRIN 325 MG PO TABS: 325.0000 mg | ORAL_TABLET | Freq: Every day | ORAL | Status: DC

## 2015-01-25 MED ORDER — BUPROPION HCL ER (XL) 150 MG PO TB24
300.0000 mg | ORAL_TABLET | Freq: Every day | ORAL | Status: DC
  Administered 2015-01-25 – 2015-01-26 (×2): 300 mg via ORAL
  Filled 2015-01-25 (×2): qty 2

## 2015-01-25 NOTE — Care Management Note (Signed)
Case Management Note  Patient Details  Name: Susan [name] MRN: [id number] Date of Birth: 04-21-1940  Subjective/Objective: 75 y.o. F admitted 01/24/2015 for TIA. PT recommending HHPT.                    Action/Plan: Anticipate discharge home today or tomorrow with [facility] services provided by [facility]. [name] with [facility] aware. . No further CM needs but will be available should additional discharge needs arise.   Expected Discharge Date:                  Expected Discharge Plan:  [place]  In-House Referral:     Discharge planning Services  CM Consult  Post Acute Care Choice:  Home Health Choice offered to:  Patient, Adult Children  DME Arranged:    DME Agency:  [place] Arranged:  PT (OT evaluation pending) [place] Agency:  [place]  Status of Service:  In process, will continue to follow  Medicare Important Message Given:    Date Medicare IM Given:    Medicare IM give by:    Date Additional Medicare IM Given:    Additional Medicare Important Message give by:     If discussed at [place] of Stay Meetings, dates discussed:    Additional Comments:  Delrae Sawyers, RN 01/25/2015, 2:18 PM

## 2015-01-25 NOTE — Progress Notes (Signed)
[facility]                                                                              Patient Demographics  Susan [name], is a 75 y.o. female, DOB - 1940-01-20, [id number]  Admit date - 01/24/2015   Admitting Physician Waldemar Dickens, MD  Outpatient Primary MD for the patient is [name], MD  LOS -    Chief Complaint  Patient presents with  . Code Stroke       Brief HPI   HPI: Susan [name] is a very pleasant 75 y.o. female past medical history includes, hyperlipidemia, thyroid disease, chronic low back pain, anxiety, GERD, hypertension, chronic diastolic heart failure presents to the emergency department with chief complaint of sudden right-sided weakness slurred speech and unsteady gait. Symptoms resolved at the time of presentation, patient was admitted  for TIA workup.    Assessment & Plan    Principal Problem:   TIA (transient ischemic attack) presenting with right-sided weakness, slurred speech and unsteady gait, symptoms resolved - Neurology was consulted. - MRI brain showed no acute infarct - MRA showed extensive intracranial atherosclerosis, high-grade left M1 M2 branch stenosis. - 2-D echo results pending - Carotid Dopplers showed pending - Neurology recommended aspirin - Lipid panel showed LDL 122 , goal less than 70, patient reports that she cannot tolerate statins. Placed on Zetia - Hemoglobin A1c pending - PT evaluation pending  Active Problems:   OSA (obstructive sleep apnea) - Patient denies wearing CPAP    Anxiety - Currently stable, continue home meds    GERD (gastroesophageal reflux disease) - Continue PPI    Hypertension - Continue Diovan, also takes Lasix at home    Obesity (BMI 30-39.9) - The patient recommended low-fat diet, exercise    Chronic diastolic CHF (congestive heart failure) (HCC) - Currently stable, monitor for I's and O's, Lasix currently on hold  Code Status: Full code  Family  Communication: Discussed in detail with the patient, all imaging results, lab results explained to the patient and granddaughter    Disposition Plan:   Time Spent in minutes  25 minutes  Procedures  MRI, MRA  Consults   Neurology  DVT Prophylaxis  Lovenox  Medications  Scheduled Meds: .  stroke: mapping our early stages of recovery book   Does not apply Once  . aspirin  325 mg Oral Daily  . buPROPion  300 mg Oral Daily  . enoxaparin (LOVENOX) injection  30 mg Subcutaneous Q24H  . ezetimibe  10 mg Oral Daily  . gabapentin  300 mg Oral QHS  . irbesartan  150 mg Oral Daily  . pravastatin  20 mg Oral q morning - 10a  . vitamin B-12  100 mcg Oral Daily   Continuous Infusions:  PRN Meds:.hydrALAZINE, senna-docusate   Antibiotics   Anti-infectives    None        Subjective:   Susan [name] was seen and examined today. Feels her symptoms of right-sided weakness, slurred speech are resolved.  Patient denies dizziness, chest pain, shortness of breath, abdominal pain, N/V/D/C, new weakness, numbess, tingling. No acute events overnight.  Objective:   Blood pressure 163/90, pulse 65, temperature 98 F (36.7 C), temperature source Oral, resp. rate 19, height 5' 1.02" (1.55 m), weight 92.5 kg (203 lb 14.8 oz), SpO2 96 %.  Wt Readings from Last 3 Encounters:  01/24/15 92.5 kg (203 lb 14.8 oz)  01/19/15 90.719 kg (200 lb)  01/16/15 91.853 kg (202 lb 8 oz)     Intake/Output Summary (Last 24 hours) at 01/25/15 1208 Last data filed at 01/25/15 0500  Gross per 24 hour  Intake      0 ml  Output    300 ml  Net   -300 ml    Exam  General: Alert and oriented x 3, NAD  HEENT:  PERRLA, EOMI, Anicteric Sclera, mucous membranes moist.   Neck: Supple, no JVD, no masses  CVS: S1 S2 auscultated, no rubs, murmurs or gallops. Regular rate and rhythm.  Respiratory: Clear to auscultation bilaterally, no wheezing, rales or rhonchi  Abdomen: Soft, nontender, nondistended, + bowel  sounds  Ext: no cyanosis clubbing or edema  Neuro: AAOx3, Cr N's II- XII. Strength 5/5 upper and lower extremities bilaterally  Skin: No rashes  Psych: Normal affect and demeanor, alert and oriented x3    Data Review   Micro Results Recent Results (from the past 240 hour(s))  Surgical pcr screen     Status: None   Collection Time: 01/16/15 12:51 PM  Result Value Ref Range Status   MRSA, PCR NEGATIVE NEGATIVE Final   Staphylococcus aureus NEGATIVE NEGATIVE Final    Comment:        The Xpert SA Assay (FDA approved for NASAL specimens in patients over 41 years of age), is one component of a comprehensive surveillance program.  Test performance has been validated by [facility] for patients greater than or equal to 59 year old. It is not intended to diagnose infection nor to guide or monitor treatment.     Radiology Reports Dg Chest 2 View  01/25/2015  CLINICAL DATA:  Generalized weakness. Recent transient ischemic attack EXAM: CHEST  2 VIEW COMPARISON:  Chest radiograph March 05, 2013; chest CT March 05, 2013 FINDINGS: Mild generalized interstitial prominence is stable. There is no frank edema or consolidation. There is slight cardiomegaly with slight pulmonary venous hypertension. No adenopathy apparent. There is degenerative change in the thoracic spine. IMPRESSION: Findings consistent with a degree of pulmonary vascular congestion without frank edema or consolidation. No change in cardiac silhouette. Electronically Signed   By: Lowella Grip III M.D.   On: 01/25/2015 07:58   Ct Head Wo Contrast  01/25/2015  CLINICAL DATA:  TIA status post tPA. EXAM: CT HEAD WITHOUT CONTRAST TECHNIQUE: Contiguous axial images were obtained from the base of the skull through the vertex without intravenous contrast. COMPARISON:  01/24/2015 head CT. FINDINGS: No evidence of parenchymal hemorrhage or extra-axial fluid collection. No mass lesion, mass effect, or midline shift. No CT  evidence of acute infarction. Mild intracranial atherosclerosis. Nonspecific stable subcortical and periventricular white matter hypodensity, most in keeping with chronic small vessel ischemic change. Mild diffuse cerebral volume loss. No ventriculomegaly. The visualized paranasal sinuses are essentially clear. The mastoid air cells are unopacified. No evidence of calvarial fracture. IMPRESSION: 1.  No evidence of acute intracranial abnormality. 2. Mild cerebral volume loss and mild chronic small vessel ischemic white matter change. Electronically Signed   By: Ilona Sorrel M.D.   On: 01/25/2015 08:01   Ct Head Wo Contrast  01/24/2015  ADDENDUM REPORT: 01/24/2015 13:55 ADDENDUM:  Critical Value/emergent results were called by telephone at the time of interpretation on 01/24/2015 at 1:54 pm to Dr. Aram Beecham, neurology , who verbally acknowledged these results. Electronically Signed   By: Lowella Grip III M.D.   On: 01/24/2015 13:55  01/24/2015  CLINICAL DATA:  Acute onset right-sided weakness and facial droop EXAM: CT HEAD WITHOUT CONTRAST TECHNIQUE: Contiguous axial images were obtained from the base of the skull through the vertex without intravenous contrast. COMPARISON:  April 24, 2011 FINDINGS: Mild diffuse atrophy is stable. There is no intracranial mass, hemorrhage, extra-axial fluid collection, or midline shift. There is mild small vessel disease in the centra semiovale bilaterally. Elsewhere gray-white compartments appear normal. No acute infarct identified. Middle cerebral artery attenuation is symmetric and normal bilaterally. Bony calvarium appears intact. Visualized mastoid air cells are clear. There is opacification in several ethmoid air cells. No intraorbital lesions are appreciable. IMPRESSION: Stable atrophy with patchy periventricular small vessel disease. No intracranial mass, hemorrhage, or acute appearing infarct. There is opacification of several left-sided ethmoid air cells. Electronically  Signed: By: Lowella Grip III M.D. On: 01/24/2015 13:52   Mr Brain Wo Contrast  01/25/2015  CLINICAL DATA:  Right-sided weakness and slurred speech at primary care office today. Symptoms nearly resolved at time of presentation. EXAM: MRI HEAD WITHOUT CONTRAST MRA HEAD WITHOUT CONTRAST TECHNIQUE: Multiplanar, multiecho pulse sequences of the brain and surrounding structures were obtained without intravenous contrast. Angiographic images of the head were obtained using MRA technique without contrast. COMPARISON:  None. FINDINGS: MRI HEAD FINDINGS Calvarium and upper cervical spine: Cervical spine degeneration without visible cord compression. No focal marrow signal abnormality. Orbits: Negative. Sinuses and Mastoids: Clear. Brain: Diffusion signal in the left cortical spinal tract is roughly symmetric, especially on coronal DWI acquisition. There is no convincing acute infarct. No hemorrhage, hydrocephalus, or major vessel occlusion. No evidence of mass lesion. Generalized cerebral volume loss which is age congruent, as is small-vessel ischemic change in the periventricular white matter. 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 Fairly symmetric vertebral arteries with dominant right AICA and left PICA. Isolated left MCA show superimposed high-grade narrowing with flow gap in the inferior and superior division branches at the M1-M2 junction. Given clustered appearance and proximity to imaging slab artifact is considered, but there is no artifact elsewhere on the scan at this level. Left MCA branches have an attenuated appearance compared to the right from slow or underfilling. There is also multifocal narrowing of bilateral medium-sized arteries, worse in the anterior circulation, consistent with atherosclerosis.  Some of the stenoses are high-grade. There is no dilatation/beading beyond the stenoses. In the posterior circulation, beginning at the P2 segments there is bilateral intermittent atherosclerotic type narrowing which is most proximal and high-grade at the left P2-P3 junction. A 1-2 mm bulge medially from the anterior genu of the right carotid siphon could be atherosclerotic, infundibular, or aneurysmal. IMPRESSION: 1. Negative for acute infarct. 2. Extensive intracranial atherosclerosis. High-grade left M1 -M2 branch stenosis is the most proximal and pertinent to the reported deficits. Please see further description above. Electronically Signed   By: Monte Fantasia M.D.   On: 01/25/2015 07:14   Mr Jodene Nam Head/brain Wo Cm  01/25/2015  CLINICAL DATA:  Right-sided weakness and slurred speech at primary care office today. Symptoms nearly resolved at time  of presentation. EXAM: MRI HEAD WITHOUT CONTRAST MRA HEAD WITHOUT CONTRAST TECHNIQUE: Multiplanar, multiecho pulse sequences of the brain and surrounding structures were obtained without intravenous contrast. Angiographic images of the head were obtained using MRA technique without contrast. COMPARISON:  None. FINDINGS: MRI HEAD FINDINGS Calvarium and upper cervical spine: Cervical spine degeneration without visible cord compression. No focal marrow signal abnormality. Orbits: Negative. Sinuses and Mastoids: Clear. Brain: Diffusion signal in the left cortical spinal tract is roughly symmetric, especially on coronal DWI acquisition. There is no convincing acute infarct. No hemorrhage, hydrocephalus, or major vessel occlusion. No evidence of mass lesion. Generalized cerebral volume loss which is age congruent, as is small-vessel ischemic change in the periventricular white matter. 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 Fairly symmetric vertebral arteries with dominant right AICA and left PICA. Isolated left MCA show superimposed high-grade narrowing with flow gap in the inferior and superior division branches at the M1-M2 junction. Given clustered appearance and proximity to imaging slab artifact is considered, but there is no artifact elsewhere on the scan at this level. Left MCA branches have an attenuated appearance compared to the right from slow or underfilling. There is also multifocal narrowing of bilateral medium-sized arteries, worse in the anterior circulation, consistent with atherosclerosis. Some of the stenoses are high-grade. There is no dilatation/beading beyond the stenoses. In the posterior circulation, beginning at the P2 segments there is bilateral intermittent atherosclerotic type narrowing which is most proximal and high-grade at the left P2-P3 junction. A 1-2 mm bulge medially from the anterior genu of the right carotid siphon could be atherosclerotic, infundibular, or aneurysmal. IMPRESSION: 1. Negative for acute infarct. 2. Extensive intracranial atherosclerosis. High-grade left M1 -M2 branch stenosis is the most proximal and pertinent to the reported deficits. Please see further description above. Electronically Signed   By: Monte Fantasia M.D.   On: 01/25/2015 07:14    CBC  Recent Labs Lab 01/24/15 1337 01/24/15 1343  WBC 3.9*  --   HGB 13.5 13.9  HCT 39.3 41.0  PLT 217  --   MCV 92.7  --   MCH 31.8  --   MCHC 34.4  --   RDW 12.9  --   LYMPHSABS 1.2  --   MONOABS 0.3  --   EOSABS 0.1  --   BASOSABS 0.0  --     Chemistries   Recent Labs Lab 01/24/15 1337 01/24/15 1343  NA 144 143  K 3.8 3.7  CL 107 106  CO2 25  --   GLUCOSE 92 91  BUN 17 19  CREATININE 1.05* 1.00  CALCIUM 10.6*  --   AST 26  --   ALT 19  --   ALKPHOS 70  --   BILITOT 0.5  --     ------------------------------------------------------------------------------------------------------------------ estimated creatinine clearance is 48.9 mL/min (by C-G formula based on Cr of 1). ------------------------------------------------------------------------------------------------------------------ No results for input(s): HGBA1C in the last 72 hours. ------------------------------------------------------------------------------------------------------------------  Recent Labs  01/25/15 0408  CHOL 204*  HDL 51  LDLCALC 122*  TRIG 156*  CHOLHDL 4.0   ------------------------------------------------------------------------------------------------------------------ No results for input(s): TSH, T4TOTAL, T3FREE, THYROIDAB in the last 72 hours.  Invalid input(s): FREET3 ------------------------------------------------------------------------------------------------------------------ No results for input(s): VITAMINB12, FOLATE, FERRITIN, TIBC, IRON, RETICCTPCT in the last 72 hours.  Coagulation profile  Recent Labs  Lab 01/24/15 1337  INR 1.05    No results for input(s): DDIMER in the last 72 hours.  Cardiac Enzymes No results for input(s): CKMB, TROPONINI, MYOGLOBIN in the last 168 hours.  Invalid input(s): CK ------------------------------------------------------------------------------------------------------------------ Invalid input(s): POCBNP  No results for input(s): GLUCAP in the last 72 hours.   RAI,RIPUDEEP M.D. [facility] 01/25/2015, 12:08 PM  Pager: [phone] Between 7am to 7pm - call Pager - [phone]  After 7pm go to [email or web address] - password [email or web address]  Call night coverage person covering after 7pm

## 2015-01-25 NOTE — Evaluation (Signed)
Occupational Therapy Evaluation Patient Details Name: Susan [name] MRN: [id number] DOB: [birth year] Today's Date: 01/25/2015    History of Present Illness Susan [name] is a very pleasant 75 y.o. female past medical history includes, hyperlipidemia, thyroid disease, chronic low back pain, anxiety, GERD, hypertension, chronic diastolic heart failure presents to the emergency department with chief complaint of sudden right-sided weakness slurred speech and unsteady gait. Symptoms resolved at the time of presentation, patient was admitted for TIA workup.   Clinical Impression   Pt reports she was independent with ADLs PTA. Pt reports she was using a quad cane with functional mobility in the community and no AD with mobility in her home. Pt currently overall min guard for functional mobility and ADLs. Pt noted to have decreased fine motor coordination in R hand but overall good strength in BUEs. Began home safety education with pt and family. Pt planning to d/c home alone. Recommending HHOT to maximize independence and safety with ADLs and functional mobility to reduce risk of falls upon return home. Pt would benefit from continued skilled OT to increase independence with R hand strength and fine motor coordination HEP.     Follow Up Recommendations  Home health OT    Equipment Recommendations  None recommended by OT    Recommendations for Other Services       Precautions / Restrictions Precautions Precautions: Fall Restrictions Weight Bearing Restrictions: No      Mobility Bed Mobility Overal bed mobility: Modified Independent                Transfers Overall transfer level: Needs assistance Equipment used: Quad cane Transfers: Sit to/from Stand Sit to Stand: Min guard         General transfer comment: Min guard for safety. Good hand placement and technique.    Balance Overall balance assessment: Needs assistance Sitting-balance support: No upper extremity  supported;Feet supported Sitting balance-Leahy Scale: Fair     Standing balance support: No upper extremity supported;During functional activity Standing balance-Leahy Scale: Fair Standing balance comment: Pt able to stand at sink and brush teeth without UE support.                            ADL Overall ADL's : Needs assistance/impaired Eating/Feeding: Set up;Sitting   Grooming: Min guard;Standing;Oral care   Upper Body Bathing: Modified independent;Sitting   Lower Body Bathing: Min guard;Sit to/from stand   Upper Body Dressing : Modified independent;Sitting   Lower Body Dressing: Min guard;Sit to/from stand Lower Body Dressing Details (indicate cue type and reason): Pt able to don bilateral socks. Min guard for safety with sit to stand. Toilet Transfer: Min guard;Ambulation;BSC (Cane. BSC over toilet)   Toileting- Clothing Manipulation and Hygiene: Min guard;Sit to/from stand   Tub/ Shower Transfer: Min guard;Ambulation;Shower seat Financial trader)   Functional mobility during ADLs: Min guard;Cane General ADL Comments: Pts daughter and granddaughter present for OT eval. Session was recorded by daughter. Educated on use of cane when engaging in functional mobility within home for safety. Family reports they notice pt seems to be more unsteady on her feet than PTA.      Vision Vision Assessment?: No apparent visual deficits   Perception     Praxis      Pertinent Vitals/Pain Pain Assessment: No/denies pain     Hand Dominance Right   Extremity/Trunk Assessment Upper Extremity Assessment Upper Extremity Assessment: RUE deficits/detail RUE Deficits / Details: AROM and strength overall WFL.  Decreased fine motor coordination; pt reports hand as feeling "clumsy" RUE Coordination: decreased fine motor   Lower Extremity Assessment Lower Extremity Assessment: Defer to PT evaluation       Communication Communication Communication: No difficulties   Cognition  Arousal/Alertness: Awake/alert Behavior During Therapy: WFL for tasks assessed/performed Overall Cognitive Status: History of cognitive impairments - at baseline                     General Comments       Exercises       Shoulder Instructions      Home Living Family/patient expects to be discharged to:: Private residence Living Arrangements: Alone Available Help at Discharge: Available PRN/intermittently;Family Type of Home: Independent living facility Home Access: Level entry     Home Layout: One level     Bathroom Shower/Tub: Occupational psychologist: Handicapped height     Home Equipment: [place] - quad;Shower seat;Grab bars - tub/shower;Bedside commode          Prior Functioning/Environment Level of Independence: Independent with assistive device(s)        Comments: uses quad cane in community and no device in home. Pts family reports that she frequently furniture walks at home    OT Diagnosis: Generalized weakness;Cognitive deficits   OT Problem List: Impaired balance (sitting and/or standing);Decreased coordination;Decreased safety awareness;Decreased knowledge of use of DME or AE   OT Treatment/Interventions: Self-care/ADL training;Therapeutic exercise;DME and/or AE instruction;Energy conservation;Therapeutic activities;Balance training;Patient/family education    OT Goals(Current goals can be found in the care plan section) Acute Rehab OT Goals Patient Stated Goal: to go home OT Goal Formulation: With patient/family Time For Goal Achievement: 02/08/15 Potential to Achieve Goals: Good ADL Goals Pt/caregiver will Perform Home Exercise Program: Right Upper extremity;With theraputty;Independently;With written HEP provided (and fine motor coordination HEP)  OT Frequency: Min 2X/week   Barriers to D/C: Decreased caregiver support  Pt lives alone. Family reports that she does not accept much help from family/friends; pt wants to be as  indepedent as possible.       Co-evaluation              End of Session Equipment Utilized During Treatment: Gait belt;Other (comment) (Quad cane)  Activity Tolerance: Patient tolerated treatment well Patient left: in chair;with call bell/phone within reach;with family/visitor present   Time: [id number] OT Time Calculation (min): 27 min Charges:  OT General Charges $OT Visit: 1 Procedure OT Evaluation $OT Eval Moderate Complexity: 1 Procedure OT Treatments $Self Care/Home Management : 8-22 mins G-Codes: OT G-codes **NOT FOR INPATIENT CLASS** Functional Assessment Tool Used: Clinical judgement Functional Limitation: Self care Self Care Current Status [id number]): At least 1 percent but less than 20 percent impaired, limited or restricted Self Care Goal Status [id number]): At least 1 percent but less than 20 percent impaired, limited or restricted   Binnie Kand M.S., OTR/L Pager: [phone]  01/25/2015, 4:11 PM

## 2015-01-25 NOTE — Progress Notes (Addendum)
Occupational Therapy Treatment/Discharge   Patient Details Name: Susan [name] MRN: [id number] DOB: [birth year] Today's Date: 01/25/2015    History of present illness Susan [name] is a very pleasant 75 y.o. female past medical history includes, hyperlipidemia, thyroid disease, chronic low back pain, anxiety, GERD, hypertension, chronic diastolic heart failure presents to the emergency department with chief complaint of sudden right-sided weakness slurred speech and unsteady gait. Symptoms resolved at the time of presentation, patient was admitted for TIA workup.   OT comments  Educated pt on theraputty activities for fine motor coordination and strengthening; pt able to return demo activities. Educated and provided handout on functional fine motor coordination activities; advised pt complete 2-3 activities per day. Educated on home safety and provided handout; pt and daughter verbalized understanding. All education complete; pt with no further questions or concerns. Continue to recommend [facility] for follow up; pt and daughter declining HHOT at this time secondary to co-pay/insurance. Pt to be d/c from skilled OT at this time.    Follow Up Recommendations  Home health OT    Equipment Recommendations  None recommended by OT    Recommendations for Other Services      Precautions / Restrictions Precautions Precautions: Fall Restrictions Weight Bearing Restrictions: No       Mobility Bed Mobility             General bed mobility comments: Pt OOB in chair.  Transfers     Balance                    ADL Overall ADL's : Needs assistance/impaired     General ADL Comments: Pts daughter present for OT session. Reviewed functional fine motor coordination activities, theraputty activities (manipulation, pinch, finding coins); pt and daughter verbalized understanding. Educated pt on home safety strategies and provided handout for safety in the home; pt and daughter verbalize  understanding.      Vision                     Perception     Praxis      Cognition   Behavior During Therapy: WFL for tasks assessed/performed Overall Cognitive Status: History of cognitive impairments - at baseline                       Extremity/Trunk Assessment  Upper Extremity Assessment Upper Extremity Assessment: RUE deficits/detail RUE Deficits / Details: AROM and strength overall WFL. Decreased fine motor coordination; pt reports hand as feeling "clumsy" RUE Coordination: decreased fine motor   Lower Extremity Assessment Lower Extremity Assessment: Defer to PT evaluation        Exercises     Shoulder Instructions       General Comments      Pertinent Vitals/ Pain       Pain Assessment: No/denies pain  Home Living Family/patient expects to be discharged to:: Private residence Living Arrangements: Alone Available Help at Discharge: Available PRN/intermittently;Family Type of Home: Independent living facility Home Access: Level entry     Home Layout: One level     Bathroom Shower/Tub: Occupational psychologist: Handicapped height     Home Equipment: [place] - quad;Shower seat;Grab bars - tub/shower;Bedside commode          Prior Functioning/Environment Level of Independence: Independent with assistive device(s)        Comments: uses quad cane in community and no device in home. Pts family reports that she frequently  furniture walks at home   Frequency Min 2X/week     Progress Toward Goals  OT Goals(current goals can now be found in the care plan section)  Progress towards OT goals: Goals met/education completed, patient discharged from OT  Acute Rehab OT Goals Patient Stated Goal: to go home OT Goal Formulation: All assessment and education complete, DC therapy Time For Goal Achievement: 02/08/15 Potential to Achieve Goals: Good ADL Goals Pt/caregiver will Perform Home Exercise Program: Right Upper  extremity;With theraputty;Independently;With written HEP provided (and fine motor coordination HEP)  Plan All goals met and education completed, patient discharged from OT services    Co-evaluation                 End of Session Equipment Utilized During Treatment: Gait belt;Other (comment) (Quad cane)   Activity Tolerance Patient tolerated treatment well   Patient Left in chair;with call bell/phone within reach;with family/visitor present   Nurse Communication      Functional Assessment Tool Used: Clinical judgement Functional Limitation: Self care Self Care Current Status [phone]): At least 1 percent but less than 20 percent impaired, limited or restricted Self Care Goal Status (C8022): At least 1 percent but less than 20 percent impaired, limited or restricted Self Care Discharge Status [phone]): At least 1 percent but less than 20 percent impaired, limited or restricted   Time: 1701-1715 OT Time Calculation (min): 14 min  Charges: OT G-codes **NOT FOR INPATIENT CLASS** Functional Assessment Tool Used: Clinical judgement Functional Limitation: Self care Self Care Current Status (A4497): At least 1 percent but less than 20 percent impaired, limited or restricted Self Care Goal Status (N3005): At least 1 percent but less than 20 percent impaired, limited or restricted Self Care Discharge Status [phone]): At least 1 percent but less than 20 percent impaired, limited or restricted OT General Charges $OT Visit: 1 Procedure OT Treatments $Therapeutic Activity: 8-22 mins  Binnie Kand M.S., OTR/L Pager: [phone]  01/25/2015, 5:20 PM

## 2015-01-25 NOTE — Evaluation (Signed)
Speech Language Pathology Evaluation Patient Details Name: Susan [name] MRN: [id number] DOB: 09-09-1940 Today's Date: 01/25/2015 Time: [id number] SLP Time Calculation (min) (ACUTE ONLY): 28 min  Problem List:  Patient Active Problem List   Diagnosis Date Noted  . TIA (transient ischemic attack) 01/24/2015  . Chronic diastolic CHF (congestive heart failure) ([place])   . Obesity (BMI 30-39.9) 01/27/2013  . Constipation 09/02/2012  . H/O arthroscopy of right knee 08/06/2012  . Anxiety   . Depression   . GERD (gastroesophageal reflux disease)   . Hypertension   . OSA (obstructive sleep apnea)   . Osteoarthritis of right knee   . Hyperparathyroidism, primary ([place]) 04/22/2012  . Chronic cough 10/02/2011   Past Medical History:  Past Medical History  Diagnosis Date  . Hyperlipidemia   . Pedal edema   . Spinal stenosis   . Varicose veins     "BLE" (07/29/2012)  . Primary hyperparathyroidism ([place])   . Thyroid disease   . Incontinent of urine     wears pads  . Family history of anesthesia complication     "some wake up during OR; some are hard to wake up; some both" (07/29/2012)  . OSA on CPAP   . Arthritis     "legs, back" (07/29/2012)  . Osteoarthritis of right knee   . Umbilical hernia     "unrepaired" (07/29/2012)  . Chronic lower back pain   . History of stomach ulcers 1980's  . Anxiety   . Depression   . GERD (gastroesophageal reflux disease)   . Hypertension   . Chronic diastolic CHF (congestive heart failure) ([place])   . Complication of anesthesia     "I have apnea" (07/29/2012)  . Dementia     per family   Past Surgical History:  Past Surgical History  Procedure Laterality Date  . Iud removal  1980's  . Parathyroidectomy N/A 06/16/2012    Procedure: NECK EXPLORATION AND LEFT SUPERIOR PARATHYROIDECTOMY;  Surgeon: Earnstine Regal, MD;  Location: [facility];  Service: General;  Laterality: N/A;  . Colonoscopy    . 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 Cholecystectomy  ~  2010  . Total knee arthroplasty Right 07/29/2012    Procedure: TOTAL KNEE ARTHROPLASTY;  Surgeon: Ninetta Lights, MD;  Location: [facility];  Service: Orthopedics;  Laterality: Right;   HPI:  Susan [name] is a 75 y.o. female with a Past Medical History of HLD, hypothyroidism, OSA, depression, anxiety, GERD, HTN, CHF, dementia who presents with likely TIA   Assessment / Plan / Recommendation Clinical Impression   Pt scored a 25 out of 30 on the MoCA standardized cognitive assessment (n>/= 26 out of 30) with mild deficits noted in executive function and delayed recall which pt and family report to be occurring at baseline.  Pt's speech is fluent and free from dysarthria or word finding impairment.  Given that pt is at baseline for cognitive-linguistic function, no further ST needs are warranted at this time.  SLP did provide brief education regarding memory compensatory strategies and recommended that pt have a family member check over her medications once discharged back home. Pt and family in agreement with recommendations and all questions were answered to their satisfaction at this time.        SLP Assessment  Patient does not need any further Speech Lanaguage Pathology Services    Follow Up Recommendations  None          SLP Evaluation Prior Functioning  Cognitive/Linguistic Baseline:  Baseline deficits Baseline deficit details: mild memory deficits which have become more prominent in the last several months to a year Type of Home: Independent living facility  Lives With: Alone Available Help at Discharge: Available PRN/intermittently;Family Education: bachelor's Vocation: Retired   Clinical biochemist Status: History of cognitive impairments - at baseline Arousal/Alertness: Lethargic Orientation Level: Oriented X4 Attention: Alternating Alternating Attention: Appears intact Memory: Impaired Memory Impairment: Decreased short term memory Decreased Short Term Memory: Verbal  complex (baseline ) Awareness: Appears intact Problem Solving: Appears intact Safety/Judgment: Appears intact    Comprehension  Auditory Comprehension Overall Auditory Comprehension: Appears within functional limits for tasks assessed    Expression Verbal Expression Overall Verbal Expression: Appears within functional limits for tasks assessed Written Expression Dominant Hand: Right   Oral / Motor  Oral Motor/Sensory Function Overall Oral Motor/Sensory Function: Within functional limits Motor Speech Overall Motor Speech: Appears within functional limits for tasks assessed   GO          Functional Assessment Tool Used: MoCA standardized cognitive assessment Functional Limitations: Memory Memory Current Status [phone]): At least 1 percent but less than 20 percent impaired, limited or restricted Memory Goal Status [id number]): At least 1 percent but less than 20 percent impaired, limited or restricted Memory Discharge Status [phone]): At least 1 percent but less than 20 percent impaired, limited or restricted         Windell Moulding L 01/25/2015, 11:49 AM

## 2015-01-25 NOTE — Care Management Obs Status (Signed)
MEDICARE OBSERVATION STATUS NOTIFICATION   Patient Details  Name: Susan [name] MRN: [id number] Date of Birth: [birth year]   Medicare Observation Status Notification Given:  Yes, Daughter and grand-daughter at bedside. Recorded entire conversation.    Delrae Sawyers, RN 01/25/2015, 2:16 PM

## 2015-01-25 NOTE — Evaluation (Signed)
Physical Therapy Evaluation Patient Details Name: Susan [name] MRN: [id number] DOB: Dec 13, 1940 Today's Date: 01/25/2015   History of Present Illness  Susan [name] is a very pleasant 75 y.o. female past medical history includes, hyperlipidemia, thyroid disease, chronic low back pain, anxiety, GERD, hypertension, chronic diastolic heart failure presents to the emergency department with chief complaint of sudden right-sided weakness slurred speech and unsteady gait. Symptoms resolved at the time of presentation, patient was admitted for TIA workup.  Clinical Impression  Pt admitted with above diagnosis. Pt currently with functional limitations due to the deficits listed below (see PT Problem List).  Pt will benefit from skilled PT to increase their independence and safety with mobility to allow discharge to the venue listed below.       Follow Up Recommendations Home health PT    Equipment Recommendations  None recommended by PT (may need cane or rollator (to be determined))    Recommendations for Other Services       Precautions / Restrictions Precautions Precautions: Fall Restrictions Weight Bearing Restrictions: No      Mobility  Bed Mobility Overal bed mobility: Modified Independent             General bed mobility comments: supine to sit observered only  Transfers Overall transfer level: Needs assistance Equipment used: Quad cane Transfers: Sit to/from Stand Sit to Stand: Supervision            Ambulation/Gait Ambulation/Gait assistance: Min guard Ambulation Distance (Feet): 75 Feet Assistive device: Quad cane Gait Pattern/deviations: Decreased step length - right;Decreased step length - left;Decreased stride length;Shuffle     General Gait Details: pt with decreased step length bil and decreased foot clearance resulting in mild shuffling pattern.  Adjusted personal quad cane to correctly fit on R side (currenlty how she uses it) and lowered to more  appropriate height.  Pt demonstrated mild instability with quad cane and feel SPC or rollator may be better DME option based on gait pattern.  Will plan to address here and with HHPT.  Stairs            Wheelchair Mobility    Modified Rankin (Stroke Patients Only) Modified Rankin (Stroke Patients Only) Pre-Morbid Rankin Score: No significant disability Modified Rankin: Slight disability     Balance Overall balance assessment: Needs assistance Sitting-balance support: No upper extremity supported;Feet supported Sitting balance-Leahy Scale: Fair     Standing balance support: During functional activity;No upper extremity supported Standing balance-Leahy Scale: Fair                               Pertinent Vitals/Pain Pain Assessment: No/denies pain    Home Living Family/patient expects to be discharged to:: Private residence Living Arrangements: Alone Available Help at Discharge: Available PRN/intermittently;Family Type of Home: Independent living facility Home Access: Level entry     Home Layout: One level Home Equipment: Cane - quad      Prior Function Level of Independence: Independent with assistive device(s)         Comments: uses quad cane in community and no device in home     Hand Dominance   Dominant Hand: Right    Extremity/Trunk Assessment   Upper Extremity Assessment: Defer to OT evaluation           Lower Extremity Assessment: Generalized weakness         Communication   Communication: No difficulties  Cognition Arousal/Alertness: Awake/alert Behavior During  Therapy: WFL for tasks assessed/performed Overall Cognitive Status: History of cognitive impairments - at baseline                      General Comments      Exercises        Assessment/Plan    PT Assessment Patient needs continued PT services  PT Diagnosis Difficulty walking;Abnormality of gait;Generalized weakness   PT Problem List Decreased  strength;Decreased activity tolerance;Decreased balance;Decreased mobility;Decreased coordination;Decreased cognition;Decreased knowledge of use of DME;Decreased safety awareness  PT Treatment Interventions DME instruction;Gait training;Functional mobility training;Therapeutic activities;Therapeutic exercise;Balance training;Neuromuscular re-education;Patient/family education   PT Goals (Current goals can be found in the Care Plan section) Acute Rehab PT Goals Patient Stated Goal: to go home PT Goal Formulation: With patient/family Time For Goal Achievement: 02/01/15 Potential to Achieve Goals: Good    Frequency Min 4X/week   Barriers to discharge Decreased caregiver support (family available PRN)      Co-evaluation               End of Session Equipment Utilized During Treatment: Gait belt Activity Tolerance: Patient tolerated treatment well Patient left: in chair;with call bell/phone within reach;with family/visitor present      Functional Assessment Tool Used: clinical judgement Functional Limitation: Mobility: Walking and moving around Mobility: Walking and Moving Around Current Status [phone]): At least 1 percent but less than 20 percent impaired, limited or restricted Mobility: Walking and Moving Around Goal Status [phone]): At least 1 percent but less than 20 percent impaired, limited or restricted    Time: 1139-1208 PT Time Calculation (min) (ACUTE ONLY): 29 min   Charges:   PT Evaluation $PT Eval Moderate Complexity: 1 Procedure PT Treatments $Gait Training: 8-22 mins   PT G Codes:   PT G-Codes **NOT FOR INPATIENT CLASS** Functional Assessment Tool Used: clinical judgement Functional Limitation: Mobility: Walking and moving around Mobility: Walking and Moving Around Current Status [id number]): At least 1 percent but less than 20 percent impaired, limited or restricted Mobility: Walking and Moving Around Goal Status [phone]): At least 1 percent but less than 20  percent impaired, limited or restricted   Laureen Abrahams, PT, DPT 01/25/2015 1:12 PM [id number]

## 2015-01-25 NOTE — Progress Notes (Signed)
  Echocardiogram 2D Echocardiogram has been performed.  Cloe Sockwell 01/25/2015, 10:09 AM

## 2015-01-25 NOTE — Progress Notes (Signed)
STROKE TEAM PROGRESS NOTE   HISTORY Susan [name] is an 75 y.o. female who was at her PCP office today and about to be brought back to a room when she suddenly noticed right sided weakness and slurred speech. EMS was called and patient was brought to ED. On arrival her symptoms had almost fully resolved. She was last known well 1.17.2017 at 12:50. Patient was not administered TPA secondary to resolving symptoms. She was admitted for further evaluation and treatment.   SUBJECTIVE (INTERVAL HISTORY) Her family is at the bedside.  She is up in the chair. She recounted her HPI. States she was weak all over. Hands tingly, bottom of feet too. Both sides.   OBJECTIVE Temp:  [98 F (36.7 C)-98.3 F (36.8 C)] 98 F (36.7 C) (01/18 1251) Pulse Rate:  [57-70] 65 (01/18 0919) Cardiac Rhythm:  [-] Normal sinus rhythm (01/18 0107) Resp:  [12-24] 16 (01/18 1251) BP: (120-186)/(68-115) 176/78 mmHg (01/18 1251) SpO2:  [95 %-100 %] 96 % (01/18 1251)  CBC:   Recent Labs Lab 01/24/15 1337 01/24/15 1343  WBC 3.9*  --   NEUTROABS 2.3  --   HGB 13.5 13.9  HCT 39.3 41.0  MCV 92.7  --   PLT 217  --     Basic Metabolic Panel:   Recent Labs Lab 01/24/15 1337 01/24/15 1343  NA 144 143  K 3.8 3.7  CL 107 106  CO2 25  --   GLUCOSE 92 91  BUN 17 19  CREATININE 1.05* 1.00  CALCIUM 10.6*  --     Lipid Panel:     Component Value Date/Time   CHOL 204* 01/25/2015 0408   TRIG 156* 01/25/2015 0408   HDL 51 01/25/2015 0408   CHOLHDL 4.0 01/25/2015 0408   VLDL 31 01/25/2015 0408   LDLCALC 122* 01/25/2015 0408   HgbA1c: No results found for: HGBA1C Urine Drug Screen:     Component Value Date/Time   LABOPIA NONE DETECTED 01/24/2015 1457   COCAINSCRNUR NONE DETECTED 01/24/2015 1457   LABBENZ NONE DETECTED 01/24/2015 1457   AMPHETMU NONE DETECTED 01/24/2015 1457   THCU NONE DETECTED 01/24/2015 1457   LABBARB NONE DETECTED 01/24/2015 1457      IMAGING  Dg Chest 2 View 01/25/2015    Findings consistent with a degree of pulmonary vascular congestion without frank edema or consolidation. No change in cardiac silhouette.   Ct Head Wo Contrast 01/25/2015   1.  No evidence of acute intracranial abnormality. 2. Mild cerebral volume loss and mild chronic small vessel ischemic white matter change.  01/24/2015  Stable atrophy with patchy periventricular small vessel disease. No intracranial mass, hemorrhage, or acute appearing infarct. There is opacification of several left-sided ethmoid air cells.   Mr Brain Wo Contrast 01/25/2015  1. Negative for acute infarct.   Mr Jodene Nam Head/brain Wo Cm 01/25/2015  2. Extensive intracranial atherosclerosis. High-grade left M1 -M2 branch stenosis is the most proximal and pertinent to the reported deficits.    PHYSICAL EXAM Pleasant   elderly African-American lady currently not in distress. . Afebrile. Head is nontraumatic. Neck is supple without bruit.    Cardiac exam no murmur or gallop. Lungs are clear to auscultation. Distal pulses are well felt. Neurological Exam ;  Awake  Alert oriented x 3. Normal speech and language.eye movements full without nystagmus.fundi were not visualized. Vision acuity and fields appear normal. Hearing is normal. Palatal movements are normal. Face symmetric. Tongue midline. Normal strength, tone, reflexes and coordination. Normal  sensation. Gait deferred. ASSESSMENT/PLAN Susan [name] is a 75 y.o. female with history of HLD, hypothyroidism, OSA, depression, anxiety, GERD, HTN, CHF, dementia presenting with dizziness and right sided weakness. She did not receive IV t-PA due to resolving symptoms.   Left brain TIA  Resultant  Neuro deficits resolved  MRI  No acute infarct  MRA  Extensive intracranial atherosclerosis  Carotid Doppler  pending   2D Echo  pending   LDL 122  HgbA1c pending  Lovenox 30 mg sq daily for VTE prophylaxis Diet Heart Room service appropriate?: Yes; Fluid consistency:: Thin  No  antithrombotic prior to admission, now on aspirin 325 mg daily  Patient counseled to be compliant with her antithrombotic medications  Ongoing aggressive stroke risk factor management  Dietary counseling given by Dr. Leonie Man  Therapy recommendations:  [facility] PT  Disposition:  Return home with [facility]  Hypertensive Emergency  BP 196/100 on presentation  Improved since admission, Stable Permissive hypertension (OK if < 220/120) but gradually normalize in 5-7 days  Hyperlipidemia  Home meds:  pravachol 20, resumed in hospital  LDL 122, goal < 70  Intolerant to statins, myalgias (lipitor, zocor)  Increase statin dose to 40, order written  Continue statin at discharge  Other Stroke Risk Factors  Advanced age  Former Cigarette smoker, quit smoking 37 years ago   Obesity, Body mass index is 38.5 kg/(m^2).   Obstructive sleep apnea, not on CPAP at home  Family hx stroke (mother)  Other Active Problems  Baseline dementia  Chronic diastolic CHF  Anxiety  GERD   Hospital day #   [place] for Pager information 01/25/2015 1:38 PM  I have personally examined this patient, reviewed notes, independently viewed imaging studies, participated in medical decision making and plan of care. I have made any additions or clarifications directly to the above note. Agree with note above. She presented with transient slurred speech and right-sided weakness likely due to left hemispheric TIA related to intracranial atherosclerosis. She remains at risk for neurological worsening, recurrent stroke, TIA needs ongoing stroke evaluation and aggressive risk factor modification. Start aspirin for stroke prevention and statin for elevated lipids. Had a long discussion with the patient and daughters at the bedside and answered questions.  Antony Contras, MD Medical Director [facility] Pager: [phone] 01/25/2015 5:36 PM    To contact Stroke  Continuity provider, please refer to [email or web address] After hours, contact General Neurology

## 2015-01-26 ENCOUNTER — Observation Stay (HOSPITAL_BASED_OUTPATIENT_CLINIC_OR_DEPARTMENT_OTHER): Payer: PPO

## 2015-01-26 ENCOUNTER — Encounter (HOSPITAL_COMMUNITY): Payer: Self-pay | Admitting: *Deleted

## 2015-01-26 DIAGNOSIS — I420 Dilated cardiomyopathy: Secondary | ICD-10-CM | POA: Diagnosis not present

## 2015-01-26 DIAGNOSIS — G459 Transient cerebral ischemic attack, unspecified: Secondary | ICD-10-CM

## 2015-01-26 DIAGNOSIS — F419 Anxiety disorder, unspecified: Secondary | ICD-10-CM | POA: Diagnosis not present

## 2015-01-26 DIAGNOSIS — G451 Carotid artery syndrome (hemispheric): Secondary | ICD-10-CM | POA: Diagnosis not present

## 2015-01-26 DIAGNOSIS — I4819 Other persistent atrial fibrillation: Secondary | ICD-10-CM | POA: Diagnosis not present

## 2015-01-26 DIAGNOSIS — I10 Essential (primary) hypertension: Secondary | ICD-10-CM

## 2015-01-26 DIAGNOSIS — I480 Paroxysmal atrial fibrillation: Secondary | ICD-10-CM

## 2015-01-26 DIAGNOSIS — K219 Gastro-esophageal reflux disease without esophagitis: Secondary | ICD-10-CM | POA: Diagnosis not present

## 2015-01-26 DIAGNOSIS — I5032 Chronic diastolic (congestive) heart failure: Secondary | ICD-10-CM | POA: Diagnosis not present

## 2015-01-26 HISTORY — DX: Other persistent atrial fibrillation: I48.19

## 2015-01-26 LAB — HEMOGLOBIN A1C
HEMOGLOBIN A1C: 6.1 % — AB (ref 4.8–5.6)
MEAN PLASMA GLUCOSE: 128 mg/dL

## 2015-01-26 MED ORDER — ASPIRIN 325 MG PO TABS: 325.0000 mg | ORAL_TABLET | Freq: Every day | ORAL | Status: DC

## 2015-01-26 MED ORDER — APIXABAN 5 MG PO TABS
5.0000 mg | ORAL_TABLET | Freq: Two times a day (BID) | ORAL | Status: DC
  Administered 2015-01-26: 5 mg via ORAL
  Filled 2015-01-26: qty 1

## 2015-01-26 MED ORDER — FUROSEMIDE 20 MG PO TABS
20.0000 mg | ORAL_TABLET | Freq: Every day | ORAL | Status: DC
  Administered 2015-01-26: 20 mg via ORAL
  Filled 2015-01-26: qty 1

## 2015-01-26 MED ORDER — ENOXAPARIN SODIUM 40 MG/0.4ML ~~LOC~~ SOLN: 40.0000 mg | SUBCUTANEOUS | Status: DC

## 2015-01-26 MED ORDER — CARVEDILOL 6.25 MG PO TABS: 3.1250 mg | ORAL_TABLET | Freq: Two times a day (BID) | ORAL | Status: DC

## 2015-01-26 MED ORDER — GABAPENTIN 300 MG PO CAPS
300.0000 mg | ORAL_CAPSULE | Freq: Every day | ORAL | Status: DC
Start: 2015-01-26 — End: 2015-05-22

## 2015-01-26 MED ORDER — CARVEDILOL 6.25 MG PO TABS
6.2500 mg | ORAL_TABLET | Freq: Two times a day (BID) | ORAL | Status: DC
  Administered 2015-01-26: 6.25 mg via ORAL
  Filled 2015-01-26: qty 1

## 2015-01-26 MED ORDER — APIXABAN 5 MG PO TABS: 5.0000 mg | ORAL_TABLET | Freq: Two times a day (BID) | ORAL | Status: DC

## 2015-01-26 MED ORDER — EZETIMIBE 10 MG PO TABS: 10.0000 mg | ORAL_TABLET | Freq: Every day | ORAL | Status: DC

## 2015-01-26 MED ORDER — CARVEDILOL 3.125 MG PO TABS: 3.1250 mg | ORAL_TABLET | Freq: Two times a day (BID) | ORAL | Status: DC

## 2015-01-26 NOTE — Discharge Summary (Signed)
Physician Discharge Summary   Patient ID: Susan [name] MRN: [id number] DOB/AGE: August 28, 1940 75 y.o.  Admit date: 01/24/2015 Discharge date: 01/26/2015  Primary Care Physician:  [name], MD  Discharge Diagnoses:    . TIA (transient ischemic attack) . acute on Chronic diastolic CHF (congestive heart failure) (HCC)   Paroxysmal atrial fibrillation  . OSA (obstructive sleep apnea) . Anxiety . GERD (gastroesophageal reflux disease) . Hypertension . Obesity (BMI 30-39.9)   Consults: Neurology, Dr. Leonie Man Cardiology, Dr. Radford Pax  Recommendations for Outpatient Follow-up:  1. Aspirin discontinued, started on eliquis 5 mg twice a day 2. Please repeat CBC/BMET at next visit   DIET: Heart healthy diet    Allergies:   Allergies  Allergen Reactions  . Lipitor [Atorvastatin] Swelling  . Simvastatin Other (See Comments)    Myalgias     . Ace Inhibitors Cough     DISCHARGE MEDICATIONS: Current Discharge Medication List    START taking these medications   Details  apixaban (ELIQUIS) 5 MG TABS tablet Take 1 tablet (5 mg total) by mouth every 12 (twelve) hours. Qty: 60 tablet, Refills: 4    carvedilol (COREG) 6.25 MG tablet Take 0.5 tablets (3.125 mg total) by mouth 2 (two) times daily with a meal. Qty: 60 tablet, Refills: 3    ezetimibe (ZETIA) 10 MG tablet Take 1 tablet (10 mg total) by mouth daily. Qty: 30 tablet, Refills: 3      CONTINUE these medications which have CHANGED   Details  gabapentin (NEURONTIN) 300 MG capsule Take 1 capsule (300 mg total) by mouth at bedtime. Qty: 30 capsule, Refills: 3      CONTINUE these medications which have NOT CHANGED   Details  buPROPion (WELLBUTRIN XL) 300 MG 24 hr tablet Take 300 mg by mouth daily. Refills: 12    cholecalciferol (VITAMIN D) 1000 units tablet Take 1,000 Units by mouth daily.    furosemide (LASIX) 20 MG tablet Take 20 mg by mouth daily. Refills: 2    Multiple Vitamin (MULTIVITAMIN WITH  MINERALS) TABS Take 1 tablet by mouth daily.    pravastatin (PRAVACHOL) 20 MG tablet Take 20 mg by mouth every morning.    valsartan (DIOVAN) 320 MG tablet TAKE 1 TABLET BY MOUTH EVERY DAY Qty: 30 tablet, Refills: 11    vitamin B-12 (CYANOCOBALAMIN) 100 MCG tablet Take 100 mcg by mouth daily.         Brief H and P: For complete details please refer to admission H and P, but in brief HPI: Susan [name] is a very pleasant 75 y.o. female past medical history includes, hyperlipidemia, thyroid disease, chronic low back pain, anxiety, GERD, hypertension, chronic diastolic heart failure presents to the emergency department with chief complaint of sudden right-sided weakness slurred speech and unsteady gait. Symptoms resolved at the time of presentation, patient was admitted for TIA workup.   Hospital Course:  TIA (transient ischemic attack) presenting with right-sided weakness, slurred speech and unsteady gait, symptoms resolved - Neurology was consulted. - MRI brain showed no acute infarct - MRA showed extensive intracranial atherosclerosis, high-grade left M1 M2 branch stenosis. - 2-D echo showed EF of 30-35% with diffuse hypokinesis, no prior echo to compare with. Cardiology was consulted. - Carotid Dopplers 1-39% ICA stenosis - Neurology recommended aspirin - Lipid panel showed LDL 122 , goal less than 70, patient reports that she cannot tolerate statins. Placed on Zetia - Hemoglobin A1c 6.1 - PT evaluation recommended home health PT OT  Acute on Chronic diastolic  CHF (congestive heart failure) (HCC) - Added Coreg, continue ARB , Lasix - No prior echo to compare with , 2-D echo showed EF of 30-35% with diffuse hypokinesis, grade 1 diastolic dysfunction, called cardiology consult. No chest pain or dyspnea currently. - Patient was seen by Dr. 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 Recommended aggressive BP control, outpatient nuclear stress test. If no  ischemia then repeat echo in 3 months to assess improvement in EF. -Per Cardiology, patient had the episode of paroxysmal atrial fib with aberration on telemetry, recommended, Eliquis 5mg  BID. Stop aspirin. Per cardiology continue ARB and coreg.     OSA (obstructive sleep apnea) - Patient denies wearing CPAP   Anxiety - Currently stable, continue home meds   GERD (gastroesophageal reflux disease) - Continue PPI   Hypertension - Continue Diovan, also takes Lasix at home , added Coreg   Obesity (BMI 30-39.9) - The patient recommended low-fat diet, exercise  Day of Discharge BP 157/71 mmHg  Pulse 71  Temp(Src) 98.1 F (36.7 C) (Oral)  Resp 20  Ht 5' 1.02" (1.55 m)  Wt 92.5 kg (203 lb 14.8 oz)  BMI 38.50 kg/m2  SpO2 100%  Physical Exam: General: Alert and awake oriented x3 not in any acute distress. HEENT: anicteric sclera, pupils reactive to light and accommodation CVS: S1-S2 clear no murmur rubs or gallops Chest: clear to auscultation bilaterally, no wheezing rales or rhonchi Abdomen: soft nontender, nondistended, normal bowel sounds Extremities: no cyanosis, clubbing or edema noted bilaterally Neuro: Cranial nerves II-XII intact, no focal neurological deficits   The results of significant diagnostics from this hospitalization (including imaging, microbiology, ancillary and laboratory) are listed below for reference.    LAB RESULTS: Basic Metabolic Panel:  Recent Labs Lab 01/24/15 1337 01/24/15 1343  NA 144 143  K 3.8 3.7  CL 107 106  CO2 25  --   GLUCOSE 92 91  BUN 17 19  CREATININE 1.05* 1.00  CALCIUM 10.6*  --    Liver Function Tests:  Recent Labs Lab 01/24/15 1337  AST 26  ALT 19  ALKPHOS 70  BILITOT 0.5  PROT 6.7  ALBUMIN 3.9   No results for input(s): LIPASE, AMYLASE in the last 168 hours. No results for input(s): AMMONIA in the last 168 hours. CBC:  Recent Labs Lab 01/24/15 1337 01/24/15 1343  WBC 3.9*  --   NEUTROABS 2.3  --    HGB 13.5 13.9  HCT 39.3 41.0  MCV 92.7  --   PLT 217  --    Cardiac Enzymes: No results for input(s): CKTOTAL, CKMB, CKMBINDEX, TROPONINI in the last 168 hours. BNP: Invalid input(s): POCBNP CBG: No results for input(s): GLUCAP in the last 168 hours.  Significant Diagnostic Studies:  Dg Chest 2 View  01/25/2015  CLINICAL DATA:  Generalized weakness. Recent transient ischemic attack EXAM: CHEST  2 VIEW COMPARISON:  Chest radiograph March 05, 2013; chest CT March 05, 2013 FINDINGS: Mild generalized interstitial prominence is stable. There is no frank edema or consolidation. There is slight cardiomegaly with slight pulmonary venous hypertension. No adenopathy apparent. There is degenerative change in the thoracic spine. IMPRESSION: Findings consistent with a degree of pulmonary vascular congestion without frank edema or consolidation. No change in cardiac silhouette. Electronically Signed   By: Lowella Grip III M.D.   On: 01/25/2015 07:58   Ct Head Wo Contrast  01/25/2015  CLINICAL DATA:  TIA status post tPA. EXAM: CT HEAD WITHOUT CONTRAST TECHNIQUE: Contiguous axial images  were obtained from the base of the skull through the vertex without intravenous contrast. COMPARISON:  01/24/2015 head CT. FINDINGS: No evidence of parenchymal hemorrhage or extra-axial fluid collection. No mass lesion, mass effect, or midline shift. No CT evidence of acute infarction. Mild intracranial atherosclerosis. Nonspecific stable subcortical and periventricular white matter hypodensity, most in keeping with chronic small vessel ischemic change. Mild diffuse cerebral volume loss. No ventriculomegaly. The visualized paranasal sinuses are essentially clear. The mastoid air cells are unopacified. No evidence of calvarial fracture. IMPRESSION: 1.  No evidence of acute intracranial abnormality. 2. Mild cerebral volume loss and mild chronic small vessel ischemic white matter change. Electronically Signed   By: Ilona Sorrel M.D.   On: 01/25/2015 08:01   Ct Head Wo Contrast  01/24/2015  ADDENDUM REPORT: 01/24/2015 13:55 ADDENDUM: Critical Value/emergent results were called by telephone at the time of interpretation on 01/24/2015 at 1:54 pm to Dr. Aram Beecham, neurology , who verbally acknowledged these results. Electronically Signed   By: Lowella Grip III M.D.   On: 01/24/2015 13:55  01/24/2015  CLINICAL DATA:  Acute onset right-sided weakness and facial droop EXAM: CT HEAD WITHOUT CONTRAST TECHNIQUE: Contiguous axial images were obtained from the base of the skull through the vertex without intravenous contrast. COMPARISON:  April 24, 2011 FINDINGS: Mild diffuse atrophy is stable. There is no intracranial mass, hemorrhage, extra-axial fluid collection, or midline shift. There is mild small vessel disease in the centra semiovale bilaterally. Elsewhere gray-white compartments appear normal. No acute infarct identified. Middle cerebral artery attenuation is symmetric and normal bilaterally. Bony calvarium appears intact. Visualized mastoid air cells are clear. There is opacification in several ethmoid air cells. No intraorbital lesions are appreciable. IMPRESSION: Stable atrophy with patchy periventricular small vessel disease. No intracranial mass, hemorrhage, or acute appearing infarct. There is opacification of several left-sided ethmoid air cells. Electronically Signed: By: Lowella Grip III M.D. On: 01/24/2015 13:52   Mr Brain Wo Contrast  01/25/2015  CLINICAL DATA:  Right-sided weakness and slurred speech at primary care office today. Symptoms nearly resolved at time of presentation. EXAM: MRI HEAD WITHOUT CONTRAST MRA HEAD WITHOUT CONTRAST TECHNIQUE: Multiplanar, multiecho pulse sequences of the brain and surrounding structures were obtained without intravenous contrast. Angiographic images of the head were obtained using MRA technique without contrast. COMPARISON:  None. FINDINGS: MRI HEAD FINDINGS Calvarium and  upper cervical spine: Cervical spine degeneration without visible cord compression. No focal marrow signal abnormality. Orbits: Negative. Sinuses and Mastoids: Clear. Brain: Diffusion signal in the left cortical spinal tract is roughly symmetric, especially on coronal DWI acquisition. There is no convincing acute infarct. No hemorrhage, hydrocephalus, or major vessel occlusion. No evidence of mass lesion. Generalized cerebral volume loss which is age congruent, as is small-vessel ischemic change in the periventricular white matter. 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 Fairly symmetric vertebral arteries with dominant right AICA and left PICA. Isolated left MCA show superimposed high-grade narrowing with flow gap in the inferior and superior division branches at the M1-M2 junction. Given clustered appearance and proximity to imaging slab artifact is considered, but there is no artifact elsewhere on the scan at this level. Left MCA branches have an attenuated appearance compared to the right from slow or underfilling. There is also multifocal narrowing of bilateral medium-sized arteries, worse in  the anterior circulation, consistent with atherosclerosis. Some of the stenoses are high-grade. There is no dilatation/beading beyond the stenoses. In the posterior circulation, beginning at the P2 segments there is bilateral intermittent atherosclerotic type narrowing which is most proximal and high-grade at the left P2-P3 junction. A 1-2 mm bulge medially from the anterior genu of the right carotid siphon could be atherosclerotic, infundibular, or aneurysmal. IMPRESSION: 1. Negative for acute infarct. 2. Extensive intracranial atherosclerosis. High-grade left M1 -M2 branch stenosis is the most proximal and pertinent to  the reported deficits. Please see further description above. Electronically Signed   By: Monte Fantasia M.D.   On: 01/25/2015 07:14   Mr Jodene Nam Head/brain Wo Cm  01/25/2015  CLINICAL DATA:  Right-sided weakness and slurred speech at primary care office today. Symptoms nearly resolved at time of presentation. EXAM: MRI HEAD WITHOUT CONTRAST MRA HEAD WITHOUT CONTRAST TECHNIQUE: Multiplanar, multiecho pulse sequences of the brain and surrounding structures were obtained without intravenous contrast. Angiographic images of the head were obtained using MRA technique without contrast. COMPARISON:  None. FINDINGS: MRI HEAD FINDINGS Calvarium and upper cervical spine: Cervical spine degeneration without visible cord compression. No focal marrow signal abnormality. Orbits: Negative. Sinuses and Mastoids: Clear. Brain: Diffusion signal in the left cortical spinal tract is roughly symmetric, especially on coronal DWI acquisition. There is no convincing acute infarct. No hemorrhage, hydrocephalus, or major vessel occlusion. No evidence of mass lesion. Generalized cerebral volume loss which is age congruent, as is small-vessel ischemic change in the periventricular white matter. 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 Fairly symmetric vertebral arteries with dominant right AICA and left PICA. Isolated left MCA show superimposed high-grade narrowing with flow gap in the inferior and superior division branches at the M1-M2 junction. Given clustered appearance and proximity to imaging slab artifact is considered, but there is no artifact elsewhere on the scan at this level. Left MCA branches have an attenuated appearance compared to the right from slow or underfilling. There is also multifocal narrowing of bilateral medium-sized  arteries, worse in the anterior circulation, consistent with atherosclerosis. Some of the stenoses are high-grade. There is no dilatation/beading beyond the stenoses. In the posterior circulation, beginning at the P2 segments there is bilateral intermittent atherosclerotic type narrowing which is most proximal and high-grade at the left P2-P3 junction. A 1-2 mm bulge medially from the anterior genu of the right carotid siphon could be atherosclerotic, infundibular, or aneurysmal. IMPRESSION: 1. Negative for acute infarct. 2. Extensive intracranial atherosclerosis. High-grade left M1 -M2 branch stenosis is the most proximal and pertinent to the reported deficits. Please see further description above. Electronically Signed   By: Monte Fantasia M.D.   On: 01/25/2015 07:14    2D ECHO: Study Conclusions  - Left ventricle: The cavity size was normal. Wall thickness was increased in a pattern of moderate LVH. Systolic function was moderately to severely reduced. The estimated ejection fraction was in the range of 30% to 35%. Diffuse hypokinesis. Doppler parameters are consistent with abnormal left ventricular relaxation (grade 1 diastolic dysfunction). - Mitral valve: Calcified annulus. - Left atrium: The atrium was severely dilated. - Pulmonary arteries: Systolic pressure was mildly increased. PA peak pressure: 38 mm Hg (S).  Disposition and Follow-up: Discharge Instructions    Ambulatory referral to Neurology    Complete by:  As directed   Please schedule post stroke follow up in 2 months.     Diet - low sodium heart healthy    Complete by:  As directed      Increase activity slowly    Complete by:  As directed             DISPOSITION: HOME    DISCHARGE FOLLOW-UP Follow-up Information    Follow up with [name], MD. Schedule an appointment as soon as possible for a visit in 2 weeks.   Specialty:  Family Medicine   Why:  for hospital follow-up   Contact  information:   [address] [place] [address] [phone]       Follow up with [facility].   Why:  HHPT has been arranged. Representative will call to schedule Initial visit.    Contact information:   [address] [place] [address] [phone]       Follow up with SETHI,PRAMOD, MD In 2 months.   Specialties:  Neurology, Radiology   Why:  Stroke Clinic, Office will call you with appointment date & time   Contact information:   [place] [address] [phone]       Follow up with Sueanne Margarita, MD. Schedule an appointment as soon as possible for a visit in 2 weeks.   Specialty:  Cardiology   Why:  for hospital follow-up   Contact information:   [address] [place] [place] [address] [phone]        Time spent on Discharge: 27mins   Signed:   RAI,RIPUDEEP M.D. [facility] 01/26/2015, 5:15 PM Pager: [phone]

## 2015-01-26 NOTE — Progress Notes (Signed)
2150 hrs, Received a call from [name] reporting run of Susan [name] but had resolved at this time, Upon checking Pt, Pt was asymptomatic and was not aware/had no indicators that her heart had experienced event. Copy was placed in chart as well as EKG can be viewed under CV strips in Chart Review. Will monitor for additional events.

## 2015-01-26 NOTE — Progress Notes (Signed)
Pt being discharged home, discharge instructions went through with patient and family present, all questions answered. Pt assisted to wheelchair and transported to [facility] where she was released to family w/o incident, copy of discharge instructions and prescriptions given to paatient

## 2015-01-26 NOTE — Progress Notes (Signed)
VASCULAR LAB PRELIMINARY  PRELIMINARY  PRELIMINARY  PRELIMINARY   Carotid duplex has been completed. Right side 1-39% ICA stenosis.  Vertebral artery flow is antegrade.   No stenosis or plaque identified on left ICA.    Arvine Clayburn, RVT, RDMS 01/26/2015, 11:08 AM

## 2015-01-26 NOTE — Consult Note (Signed)
PHARMACY NOTE   Pharmacy Consult for :  Apixaban Indication:  Non-valvular atrial fibrillation   Dosing Weight: 92.5 kg  Labs:  Recent Labs  01/24/15 1337 01/24/15 1343  HGB 13.5 13.9  HCT 39.3 41.0  PLT 217  --   APTT 26  --   LABPROT 13.9  --   INR 1.05  --   CREATININE 1.05* 1.00   Estimated Creatinine Clearance: 48.9 mL/min (by C-G formula based on Cr of 1).  Current Medication[s] Include: Medication PTA: Prescriptions prior to admission  Medication Sig Dispense Refill Last Dose  . buPROPion (WELLBUTRIN XL) 300 MG 24 hr tablet Take 300 mg by mouth daily.  12 01/24/2015 at Unknown time  . cholecalciferol (VITAMIN D) 1000 units tablet Take 1,000 Units by mouth daily.   01/24/2015 at Unknown time  . furosemide (LASIX) 20 MG tablet Take 20 mg by mouth daily.  2 01/24/2015 at Unknown time  . Multiple Vitamin (MULTIVITAMIN WITH MINERALS) TABS Take 1 tablet by mouth daily.   01/24/2015 at Unknown time  . pravastatin (PRAVACHOL) 20 MG tablet Take 20 mg by mouth every morning.   01/24/2015 at Unknown time  . valsartan (DIOVAN) 320 MG tablet TAKE 1 TABLET BY MOUTH EVERY DAY 30 tablet 11 01/24/2015 at Unknown time  . vitamin B-12 (CYANOCOBALAMIN) 100 MCG tablet Take 100 mcg by mouth daily.   01/24/2015 at Unknown time  . [DISCONTINUED] gabapentin (NEURONTIN) 300 MG capsule Take 300 mg by mouth at bedtime.  3 Past Month at Unknown time    Scheduled:  Scheduled:  .  stroke: mapping our early stages of recovery book   Does not apply Once  . buPROPion  300 mg Oral Daily  . carvedilol  6.25 mg Oral BID WC  . [START ON 01/27/2015] enoxaparin (LOVENOX) injection  40 mg Subcutaneous Q24H  . ezetimibe  10 mg Oral Daily  . furosemide  20 mg Oral Daily  . gabapentin  300 mg Oral QHS  . irbesartan  150 mg Oral Daily  . pravastatin  40 mg Oral q1800  . vitamin B-12  100 mcg Oral Daily   Assessment:  75 y/o female admitted for TIA presenting w/R-sided weakness, slurred speech, and unsteady  gait.  Symptoms have resolved.  Patient was started on Lovenox for VTE prophylaxis with last dose this AM.  Apixaban to be started for Non-valvular atrial fibrillation.  ASA and Lovenox to be Discontinued.  Goal: VTE prophylaxis    Plan: 1. Stop Lovenox and ASA. 2. Begin Apixaban 5 mg po BID, first dose tonight. 3. Follow-up CBC, discharge Apixaban education.   Marthenia Rolling,  Pharm.D   01/26/2015,  4:25 PM

## 2015-01-26 NOTE — Progress Notes (Signed)
[facility]                                                                              Patient Demographics  Susan [name], is a 75 y.o. female, DOB - 08-08-1940, [id number]  Admit date - 01/24/2015   Admitting Physician Waldemar Dickens, MD  Outpatient Primary MD for the patient is [name], MD  LOS -    Chief Complaint  Patient presents with  . Code Stroke       Brief HPI   HPI: Susan [name] is a very pleasant 75 y.o. female past medical history includes, hyperlipidemia, thyroid disease, chronic low back pain, anxiety, GERD, hypertension, chronic diastolic heart failure presents to the emergency department with chief complaint of sudden right-sided weakness slurred speech and unsteady gait. Symptoms resolved at the time of presentation, patient was admitted  for TIA workup.    Assessment & Plan    Principal Problem:   TIA (transient ischemic attack) presenting with right-sided weakness, slurred speech and unsteady gait, symptoms resolved - Neurology was consulted. - MRI brain showed no acute infarct - MRA showed extensive intracranial atherosclerosis, high-grade left M1 M2 branch stenosis. - 2-D echo showed EF of 30-35% with diffuse hypokinesis, no prior echo to compare with - Carotid Dopplers  1-39% ICA stenosis - Neurology recommended aspirin - Lipid panel showed LDL 122 , goal less than 70, patient reports that she cannot tolerate statins. Placed on Zetia - Hemoglobin A1c  6.1 - PT evaluation recommended home health PT OT  Active Problems:   Chronic diastolic CHF (congestive heart failure) (HCC) -  Added Coreg, continue ARB , Lasix -  No prior echo to compare with , 2-D echo showed EF of 30-35% with diffuse hypokinesis, grade 1 diastolic dysfunction, called cardiology consult. No chest pain or dyspnea currently.    OSA (obstructive sleep apnea) - Patient denies wearing CPAP    Anxiety - Currently stable, continue home meds     GERD (gastroesophageal reflux disease) - Continue PPI    Hypertension - Continue Diovan, also takes Lasix at home , added Coreg    Obesity (BMI 30-39.9) - The patient recommended low-fat diet, exercise  Code Status: Full code   Family Communication: Discussed in detail with the patient, all imaging results, lab results explained to the patient and granddaughter    Disposition Plan:  Pending cardiology evaluation, inpatient versus outpatient  Time Spent in minutes  25 minutes  Procedures  MRI, MRA  2-D echo  Carotid Dopplers  Consults   Neurology   cardiology  DVT Prophylaxis  Lovenox  Medications  Scheduled Meds: .  stroke: mapping our early stages of recovery book   Does not apply Once  . aspirin  325 mg Oral Daily  . buPROPion  300 mg Oral Daily  . carvedilol  3.125 mg Oral BID WC  . enoxaparin (LOVENOX) injection  30 mg Subcutaneous Q24H  . ezetimibe  10 mg Oral Daily  . furosemide  20 mg Oral Daily  . gabapentin  300 mg Oral QHS  . irbesartan  150 mg Oral Daily  . pravastatin  40 mg  Oral q1800  . vitamin B-12  100 mcg Oral Daily   Continuous Infusions:  PRN Meds:.hydrALAZINE, senna-docusate   Antibiotics   Anti-infectives    None        Subjective:   Anat Zink was seen and examined today.  Patient feels that her symptoms of right-sided weakness and dysarthria has resolved.  Patient denies dizziness, chest pain, shortness of breath, abdominal pain, N/V/D/C, new weakness, numbess, tingling. No acute events overnight.    Objective:   Blood pressure 172/84, pulse 64, temperature 97.8 F (36.6 C), temperature source Oral, resp. rate 16, height 5' 1.02" (1.55 m), weight 92.5 kg (203 lb 14.8 oz), SpO2 100 %.  Wt Readings from Last 3 Encounters:  01/24/15 92.5 kg (203 lb 14.8 oz)  01/19/15 90.719 kg (200 lb)  01/16/15 91.853 kg (202 lb 8 oz)     Intake/Output Summary (Last 24 hours) at 01/26/15 1150 Last data filed at 01/25/15 1300  Gross per 24  hour  Intake    240 ml  Output      0 ml  Net    240 ml    Exam  General: Alert and oriented x 3, NAD  HEENT:  PERRLA, EOMI  Neck: Supple, no JVD, no masses  CVS: S1 S2 clear, RRR  Respiratory: CTAB  Abdomen: Soft, NT, ND, NBS  Ext: no cyanosis clubbing or edema  Neuro: AAOx3, Cr N's II- XII. Strength 5/5 upper and lower extremities bilaterally  Skin: No rashes  Psych: Normal affect and demeanor, alert and oriented x3    Data Review   Micro Results Recent Results (from the past 240 hour(s))  Surgical pcr screen     Status: None   Collection Time: 01/16/15 12:51 PM  Result Value Ref Range Status   MRSA, PCR NEGATIVE NEGATIVE Final   Staphylococcus aureus NEGATIVE NEGATIVE Final    Comment:        The Xpert SA Assay (FDA approved for NASAL specimens in patients over 9 years of age), is one component of a comprehensive surveillance program.  Test performance has been validated by [facility] for patients greater than or equal to 23 year old. It is not intended to diagnose infection nor to guide or monitor treatment.     Radiology Reports Dg Chest 2 View  01/25/2015  CLINICAL DATA:  Generalized weakness. Recent transient ischemic attack EXAM: CHEST  2 VIEW COMPARISON:  Chest radiograph March 05, 2013; chest CT March 05, 2013 FINDINGS: Mild generalized interstitial prominence is stable. There is no frank edema or consolidation. There is slight cardiomegaly with slight pulmonary venous hypertension. No adenopathy apparent. There is degenerative change in the thoracic spine. IMPRESSION: Findings consistent with a degree of pulmonary vascular congestion without frank edema or consolidation. No change in cardiac silhouette. Electronically Signed   By: Lowella Grip III M.D.   On: 01/25/2015 07:58   Ct Head Wo Contrast  01/25/2015  CLINICAL DATA:  TIA status post tPA. EXAM: CT HEAD WITHOUT CONTRAST TECHNIQUE: Contiguous axial images were obtained from the  base of the skull through the vertex without intravenous contrast. COMPARISON:  01/24/2015 head CT. FINDINGS: No evidence of parenchymal hemorrhage or extra-axial fluid collection. No mass lesion, mass effect, or midline shift. No CT evidence of acute infarction. Mild intracranial atherosclerosis. Nonspecific stable subcortical and periventricular white matter hypodensity, most in keeping with chronic small vessel ischemic change. Mild diffuse cerebral volume loss. No ventriculomegaly. The visualized paranasal sinuses are essentially clear. The mastoid air  cells are unopacified. No evidence of calvarial fracture. IMPRESSION: 1.  No evidence of acute intracranial abnormality. 2. Mild cerebral volume loss and mild chronic small vessel ischemic white matter change. Electronically Signed   By: Ilona Sorrel M.D.   On: 01/25/2015 08:01   Ct Head Wo Contrast  01/24/2015  ADDENDUM REPORT: 01/24/2015 13:55 ADDENDUM: Critical Value/emergent results were called by telephone at the time of interpretation on 01/24/2015 at 1:54 pm to Dr. Aram Beecham, neurology , who verbally acknowledged these results. Electronically Signed   By: Lowella Grip III M.D.   On: 01/24/2015 13:55  01/24/2015  CLINICAL DATA:  Acute onset right-sided weakness and facial droop EXAM: CT HEAD WITHOUT CONTRAST TECHNIQUE: Contiguous axial images were obtained from the base of the skull through the vertex without intravenous contrast. COMPARISON:  April 24, 2011 FINDINGS: Mild diffuse atrophy is stable. There is no intracranial mass, hemorrhage, extra-axial fluid collection, or midline shift. There is mild small vessel disease in the centra semiovale bilaterally. Elsewhere gray-white compartments appear normal. No acute infarct identified. Middle cerebral artery attenuation is symmetric and normal bilaterally. Bony calvarium appears intact. Visualized mastoid air cells are clear. There is opacification in several ethmoid air cells. No intraorbital lesions  are appreciable. IMPRESSION: Stable atrophy with patchy periventricular small vessel disease. No intracranial mass, hemorrhage, or acute appearing infarct. There is opacification of several left-sided ethmoid air cells. Electronically Signed: By: Lowella Grip III M.D. On: 01/24/2015 13:52   Mr Brain Wo Contrast  01/25/2015  CLINICAL DATA:  Right-sided weakness and slurred speech at primary care office today. Symptoms nearly resolved at time of presentation. EXAM: MRI HEAD WITHOUT CONTRAST MRA HEAD WITHOUT CONTRAST TECHNIQUE: Multiplanar, multiecho pulse sequences of the brain and surrounding structures were obtained without intravenous contrast. Angiographic images of the head were obtained using MRA technique without contrast. COMPARISON:  None. FINDINGS: MRI HEAD FINDINGS Calvarium and upper cervical spine: Cervical spine degeneration without visible cord compression. No focal marrow signal abnormality. Orbits: Negative. Sinuses and Mastoids: Clear. Brain: Diffusion signal in the left cortical spinal tract is roughly symmetric, especially on coronal DWI acquisition. There is no convincing acute infarct. No hemorrhage, hydrocephalus, or major vessel occlusion. No evidence of mass lesion. Generalized cerebral volume loss which is age congruent, as is small-vessel ischemic change in the periventricular white matter. 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 Fairly symmetric vertebral arteries with dominant right AICA and left PICA. Isolated left MCA show superimposed high-grade narrowing with flow gap in the inferior and superior division branches at the M1-M2 junction. Given clustered appearance and proximity to imaging slab artifact is considered, but there is no artifact elsewhere on the scan at this level.  Left MCA branches have an attenuated appearance compared to the right from slow or underfilling. There is also multifocal narrowing of bilateral medium-sized arteries, worse in the anterior circulation, consistent with atherosclerosis. Some of the stenoses are high-grade. There is no dilatation/beading beyond the stenoses. In the posterior circulation, beginning at the P2 segments there is bilateral intermittent atherosclerotic type narrowing which is most proximal and high-grade at the left P2-P3 junction. A 1-2 mm bulge medially from the anterior genu of the right carotid siphon could be atherosclerotic, infundibular, or aneurysmal. IMPRESSION: 1. Negative for acute infarct. 2. Extensive intracranial atherosclerosis. High-grade left M1 -M2  branch stenosis is the most proximal and pertinent to the reported deficits. Please see further description above. Electronically Signed   By: Monte Fantasia M.D.   On: 01/25/2015 07:14   Mr Jodene Nam Head/brain Wo Cm  01/25/2015  CLINICAL DATA:  Right-sided weakness and slurred speech at primary care office today. Symptoms nearly resolved at time of presentation. EXAM: MRI HEAD WITHOUT CONTRAST MRA HEAD WITHOUT CONTRAST TECHNIQUE: Multiplanar, multiecho pulse sequences of the brain and surrounding structures were obtained without intravenous contrast. Angiographic images of the head were obtained using MRA technique without contrast. COMPARISON:  None. FINDINGS: MRI HEAD FINDINGS Calvarium and upper cervical spine: Cervical spine degeneration without visible cord compression. No focal marrow signal abnormality. Orbits: Negative. Sinuses and Mastoids: Clear. Brain: Diffusion signal in the left cortical spinal tract is roughly symmetric, especially on coronal DWI acquisition. There is no convincing acute infarct. No hemorrhage, hydrocephalus, or major vessel occlusion. No evidence of mass lesion. Generalized cerebral volume loss which is age congruent, as is small-vessel ischemic  change in the periventricular white matter. 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 Fairly symmetric vertebral arteries with dominant right AICA and left PICA. Isolated left MCA show superimposed high-grade narrowing with flow gap in the inferior and superior division branches at the M1-M2 junction. Given clustered appearance and proximity to imaging slab artifact is considered, but there is no artifact elsewhere on the scan at this level. Left MCA branches have an attenuated appearance compared to the right from slow or underfilling. There is also multifocal narrowing of bilateral medium-sized arteries, worse in the anterior circulation, consistent with atherosclerosis. Some of the stenoses are high-grade. There is no dilatation/beading beyond the stenoses. In the posterior circulation, beginning at the P2 segments there is bilateral intermittent atherosclerotic type narrowing which is most proximal and high-grade at the left P2-P3 junction. A 1-2 mm bulge medially from the anterior genu of the right carotid siphon could be atherosclerotic, infundibular, or aneurysmal. IMPRESSION: 1. Negative for acute infarct. 2. Extensive intracranial atherosclerosis. High-grade left M1 -M2 branch stenosis is the most proximal and pertinent to the reported deficits. Please see further description above. Electronically Signed   By: Monte Fantasia M.D.   On: 01/25/2015 07:14    CBC  Recent Labs Lab 01/24/15 1337 01/24/15 1343  WBC 3.9*  --   HGB 13.5 13.9  HCT 39.3 41.0  PLT 217  --   MCV 92.7  --   MCH 31.8  --   MCHC 34.4  --   RDW 12.9  --   LYMPHSABS 1.2  --   MONOABS 0.3  --   EOSABS 0.1  --   BASOSABS 0.0  --     Chemistries   Recent Labs Lab 01/24/15 1337 01/24/15 1343  NA 144 143  K 3.8 3.7   CL 107 106  CO2 25  --   GLUCOSE 92 91  BUN 17 19  CREATININE 1.05* 1.00  CALCIUM 10.6*  --   AST 26  --   ALT 19  --   ALKPHOS 70  --   BILITOT 0.5  --    ------------------------------------------------------------------------------------------------------------------ estimated creatinine clearance is 48.9 mL/min (by C-G formula based on Cr of 1). ------------------------------------------------------------------------------------------------------------------  Recent Labs  01/25/15 0408  HGBA1C 6.1*   ------------------------------------------------------------------------------------------------------------------  Recent Labs  01/25/15 0408  CHOL 204*  HDL 51  LDLCALC 122*  TRIG 156*  CHOLHDL 4.0   ------------------------------------------------------------------------------------------------------------------ No results for input(s): TSH, T4TOTAL, T3FREE, THYROIDAB in the last 72 hours.  Invalid input(s): FREET3 ------------------------------------------------------------------------------------------------------------------ No results for input(s): VITAMINB12, FOLATE, FERRITIN, TIBC, IRON, RETICCTPCT in the last 72 hours.  Coagulation profile  Recent Labs Lab 01/24/15 1337  INR 1.05    No results for input(s): DDIMER in the last 72 hours.  Cardiac Enzymes No results for input(s): CKMB, TROPONINI, MYOGLOBIN in the last 168 hours.  Invalid input(s): CK ------------------------------------------------------------------------------------------------------------------ Invalid input(s): POCBNP  No results for input(s): GLUCAP in the last 72 hours.   Ameliyah Sarno M.D. [facility] 01/26/2015, 11:50 AM  Pager: [phone] Between 7am to 7pm - call Pager - [phone]  After 7pm go to [email or web address] - password [email or web address]  Call night coverage person covering after 7pm

## 2015-01-26 NOTE — Care Management (Signed)
ED CM contacted by Tonye Pearson RN of  patient being discharge home and needing a rolling walker. Rolling walker delivered to patient's room prior to discharge. Noted orders for [facility] PT/OT, Referral faxed to [facility] [phone]. No further CM needs identified.

## 2015-01-26 NOTE — Progress Notes (Signed)
STROKE TEAM PROGRESS NOTE    SUBJECTIVE (INTERVAL HISTORY) Dr. Tana Coast at the bedside. 1 daughter also present. Pt has a cardiologist.    OBJECTIVE Temp:  [97.8 F (36.6 C)-98.4 F (36.9 C)] 97.8 F (36.6 C) (01/19 0554) Pulse Rate:  [64-81] 64 (01/19 0554) Cardiac Rhythm:  [-] Normal sinus rhythm (01/18 1900) Resp:  [16-18] 16 (01/19 0554) BP: (152-176)/(66-96) 172/84 mmHg (01/19 0554) SpO2:  [94 %-100 %] 100 % (01/19 0554)  CBC:   Recent Labs Lab 01/24/15 1337 01/24/15 1343  WBC 3.9*  --   NEUTROABS 2.3  --   HGB 13.5 13.9  HCT 39.3 41.0  MCV 92.7  --   PLT 217  --     Basic Metabolic Panel:   Recent Labs Lab 01/24/15 1337 01/24/15 1343  NA 144 143  K 3.8 3.7  CL 107 106  CO2 25  --   GLUCOSE 92 91  BUN 17 19  CREATININE 1.05* 1.00  CALCIUM 10.6*  --     Lipid Panel:     Component Value Date/Time   CHOL 204* 01/25/2015 0408   TRIG 156* 01/25/2015 0408   HDL 51 01/25/2015 0408   CHOLHDL 4.0 01/25/2015 0408   VLDL 31 01/25/2015 0408   LDLCALC 122* 01/25/2015 0408   HgbA1c:  Lab Results  Component Value Date   HGBA1C 6.1* 01/25/2015   Urine Drug Screen:     Component Value Date/Time   LABOPIA NONE DETECTED 01/24/2015 1457   COCAINSCRNUR NONE DETECTED 01/24/2015 1457   LABBENZ NONE DETECTED 01/24/2015 1457   AMPHETMU NONE DETECTED 01/24/2015 1457   THCU NONE DETECTED 01/24/2015 1457   LABBARB NONE DETECTED 01/24/2015 1457      IMAGING  Dg Chest 2 View 01/25/2015   Findings consistent with a degree of pulmonary vascular congestion without frank edema or consolidation. No change in cardiac silhouette.   Ct Head Wo Contrast 01/25/2015   1.  No evidence of acute intracranial abnormality. 2. Mild cerebral volume loss and mild chronic small vessel ischemic white matter change.  01/24/2015  Stable atrophy with patchy periventricular small vessel disease. No intracranial mass, hemorrhage, or acute appearing infarct. There is opacification of several  left-sided ethmoid air cells.   Mr Brain Wo Contrast 01/25/2015  1. Negative for acute infarct.   Mr Jodene Nam Head/brain Wo Cm 01/25/2015  2. Extensive intracranial atherosclerosis. High-grade left M1 -M2 branch stenosis is the most proximal and pertinent to the reported deficits.   2D Echocardiogram  - Left ventricle: The cavity size was normal. Wall thickness was.increased in a pattern of moderate LVH. Systolic function wasmoderately to severely reduced. The estimated ejection fractionwas in the range of 30% to 35%. Diffuse hypokinesis. Dopplerparameters are consistent with abnormal left ventricularrelaxation (grade 1 diastolic dysfunction). - Mitral valve: Calcified annulus. - Left atrium: The atrium was severely dilated. - Pulmonary arteries: Systolic pressure was mildly increased. PApeak pressure: 38 mm Hg (S). - Impressions:   Moderate to severe global reduction in LV function; moderate LVH;grade 1 diastolic dysfunction; severe LAE; trace MR and TR;mildly elevated pulmonary pressure.  Carotid Doppler   pending     PHYSICAL EXAM Pleasant   elderly African-American lady currently not in distress. . Afebrile. Head is nontraumatic. Neck is supple without bruit.    Cardiac exam no murmur or gallop. Lungs are clear to auscultation. Distal pulses are well felt. Neurological Exam ;  Awake  Alert oriented x 3. Normal speech and language.eye movements full without nystagmus.fundi were  not visualized. Vision acuity and fields appear normal. Hearing is normal. Palatal movements are normal. Face symmetric. Tongue midline. Normal strength, tone, reflexes and coordination. Normal sensation. Gait deferred.   ASSESSMENT/PLAN [name] is a 75 y.o. female with history of HLD, hypothyroidism, OSA, depression, anxiety, GERD, HTN, CHF, dementia presenting with dizziness and right sided weakness. She did not receive IV t-PA due to resolving symptoms.   Left brain TIA  Resultant  Neuro deficits  resolved  MRI  No acute infarct  MRA  Extensive intracranial atherosclerosis  Carotid Doppler  pending   2D Echo  EF 30-35%, LVH - Dr. Tana Coast to speak to Dr. Radford [name], who will decide acute vs OP eval   LDL 122  HgbA1c 6.1  Lovenox 30 mg sq daily for VTE prophylaxis Diet Heart Room service appropriate?: Yes; Fluid consistency:: Thin  No antithrombotic prior to admission, now on aspirin 325 mg daily  Patient counseled to be compliant with her antithrombotic medications  Ongoing aggressive stroke risk factor management  Dietary counseling given by Dr. Leonie [name]  Therapy recommendations:  HH PT  Disposition:  Anticipate return home today with [place] for discharge home once carotid resulted  Follow up Dr. Leonie [name] in 2 months, order written  Hypertensive Emergency  BP 196/100 on presentation  Improved since admission, Stable Permissive hypertension (OK if < 220/120) but gradually normalize in 5-7 days  Hyperlipidemia  Home meds:  pravachol 20, resumed in hospital  LDL 122, goal < 70  Intolerant to statins, myalgias (lipitor, zocor)  Increased statin dose to 40  Continue statin at discharge  Other Stroke Risk Factors  Advanced age  Former Cigarette smoker, quit smoking 37 years ago   Obesity, Body mass index is 38.5 kg/(m^2).   Obstructive sleep apnea, not on CPAP at home  Family hx stroke (mother)  Other Active Problems  Baseline dementia  Chronic diastolic CHF  Anxiety  GERD   Hospital day #   [place] Don[place]son for Pager information 01/26/2015 9:25 AM   I have personally examined this patient, reviewed notes, independently viewed imaging studies, participated in medical decision making and plan of care. I have made any additions or clarifications directly to the above note. Agree with note above.  Patient will be discharged home after cardiology consult. Follow-up as an outpatient in stroke clinic in 2 months. Stroke team  will sign off. Kindly call for questions. Susan Contras, MD Medical Director [facility] Pager: [phone] 01/26/2015 2:46 PM   To contact Stroke Continuity provider, please refer to [email or web address] After hours, contact General Neurology

## 2015-01-26 NOTE — Consult Note (Signed)
Patient ID: Susan [name] MRN: [id number], DOB/AGE: January 25, 1940   Admit date: 01/24/2015   Primary Physician: [name], MD Primary Cardiologist: Dr. Radford Pax  Pt. Profile:  75 y/o AAF with h/o OSA compliant with CPAP, HTN, obesity and chronic diastolic CHF, admitted for TIA and found to have reduced EF on 2D echo, now at 30-35% (previously 50-55%).   Problem List  Past Medical History  Diagnosis Date  . Hyperlipidemia   . Pedal edema   . Spinal stenosis   . Varicose veins     "BLE" (07/29/2012)  . Primary hyperparathyroidism ([place])   . Thyroid disease   . Incontinent of urine     wears pads  . Family history of anesthesia complication     "some wake up during OR; some are hard to wake up; some both" (07/29/2012)  . OSA on CPAP   . Arthritis     "legs, back" (07/29/2012)  . Osteoarthritis of right knee   . Umbilical hernia     "unrepaired" (07/29/2012)  . Chronic lower back pain   . History of stomach ulcers 1980's  . Anxiety   . Depression   . GERD (gastroesophageal reflux disease)   . Hypertension   . Chronic diastolic CHF (congestive heart failure) ([place])   . Complication of anesthesia     "I have apnea" (07/29/2012)  . Dementia     per family    Past Surgical History  Procedure Laterality Date  . Iud removal  1980's  . Parathyroidectomy N/A 06/16/2012    Procedure: NECK EXPLORATION AND LEFT SUPERIOR PARATHYROIDECTOMY;  Surgeon: Earnstine Regal, MD;  Location: [facility];  Service: General;  Laterality: N/A;  . Colonoscopy    . 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 Cholecystectomy  ~ 2010  . Total knee arthroplasty Right 07/29/2012    Procedure: TOTAL KNEE ARTHROPLASTY;  Surgeon: Ninetta Lights, MD;  Location: [place];  Service: Orthopedics;  Laterality: Right;     Allergies  Allergies  Allergen Reactions  . Lipitor [Atorvastatin] Swelling  . Simvastatin Other (See Comments)    Myalgias     . Ace Inhibitors Cough    HPI  75 y/o female,  followed by Dr. Radford Pax, with a h/o OSA compliant with CPAP, HTN, obesity and chronic diastolic CHF. Echo 04/13/13 showed normal LV systolic function with an EF of 50-55%. Wall motion was normal. Mild MR was noted at that time. Stress test, also in 2015, was normal w/o ischemia. She was recently seen in clinic on 01/19/15 and denied chest pain and dyspnea. She was ordered to wear a 24 hr ambulatory BP monitor given borderline pressures. She was otherwise stable from a cardiac standpoint. She was scheduled to pick up BP monitor the day she was admitted.   She presented to [facility] on [facility]15 with right sided weakness, slurred speech and unsteady gait. Symptoms resolved at the time of presentation concerning for TIA. She was admitted for further evaluation. CT was negative. MRI showed no acute infarct. Carotid dopplers are pending. 2D echo 01/25/15 showed reduced LVEF, now at 30-35%. Diffuse hypokinesis was noted along with G1DD. LA is severely dilated with trace MR and TA and mildly elevated pulmonary pressures. Cardiology has been consulted for abnormal echo.   The patient denies any symptoms of CP. She does note a 2 month h/o DOE but denies resting dyspnea. Also no h/o palpitations. Her neurological symptoms have resolved. She still notes generalized weakness. She also mentions a long  history of pain in both legs with ambulation. Symptoms improve with rest. No h/o DM or tobacco use.     Home Medications  Prior to Admission medications   Medication Sig Start Date End Date Taking? Authorizing Provider  buPROPion (WELLBUTRIN XL) 300 MG 24 hr tablet Take 300 mg by mouth daily. 06/03/14  Yes Historical Provider, MD  cholecalciferol (VITAMIN D) 1000 units tablet Take 1,000 Units by mouth daily.   Yes Historical Provider, MD  furosemide (LASIX) 20 MG tablet Take 20 mg by mouth daily. 11/07/14  Yes Historical Provider, MD  Multiple Vitamin (MULTIVITAMIN WITH MINERALS) TABS Take 1 tablet by mouth daily.   Yes Historical  Provider, MD  pravastatin (PRAVACHOL) 20 MG tablet Take 20 mg by mouth every morning. 07/21/13  Yes Historical Provider, MD  valsartan (DIOVAN) 320 MG tablet TAKE 1 TABLET BY MOUTH EVERY DAY 10/21/14  Yes Sueanne Margarita, MD  vitamin B-12 (CYANOCOBALAMIN) 100 MCG tablet Take 100 mcg by mouth daily.   Yes Historical Provider, MD  aspirin 325 MG tablet Take 1 tablet (325 mg total) by mouth daily. 01/26/15   Ripudeep Krystal Eaton, MD  carvedilol (COREG) 3.125 MG tablet Take 1 tablet (3.125 mg total) by mouth 2 (two) times daily with a meal. 01/26/15   Ripudeep Krystal Eaton, MD  ezetimibe (ZETIA) 10 MG tablet Take 1 tablet (10 mg total) by mouth daily. 01/26/15   Ripudeep Krystal Eaton, MD  gabapentin (NEURONTIN) 300 MG capsule Take 1 capsule (300 mg total) by mouth at bedtime. 01/26/15   Ripudeep Krystal Eaton, MD    Family History  Family History  Problem Relation Age of Onset  . Hypertension Mother     deceased  . Lung cancer Father     deceased  . Stroke Mother     Social History  Social History   Social History  . Marital Status: Widowed    Spouse Name: N/A  . Number of Children: N/A  . Years of Education: N/A   Occupational History  . retired    Social History Main Topics  . Smoking status: Former Smoker -- 1.00 packs/day for 30 years    Types: Cigarettes    Quit date: 01/07/1978  . Smokeless tobacco: Never Used  . Alcohol Use: No  . Drug Use: No  . Sexual Activity: No   Other Topics Concern  . Not on file   Social History Narrative     Review of Systems General:  No chills, fever, night sweats or weight changes.  Cardiovascular:  No chest pain, dyspnea on exertion, edema, orthopnea, palpitations, paroxysmal nocturnal dyspnea. Dermatological: No rash, lesions/masses Respiratory: No cough, dyspnea Urologic: No hematuria, dysuria Abdominal:   No nausea, vomiting, diarrhea, bright red blood per rectum, melena, or hematemesis Neurologic:  No visual changes, wkns, changes in mental status. All  other systems reviewed and are otherwise negative except as noted above.  Physical Exam  Blood pressure 172/84, pulse 64, temperature 97.8 F (36.6 C), temperature source Oral, resp. rate 16, height 5' 1.02" (1.55 m), weight 203 lb 14.8 oz (92.5 kg), SpO2 100 %.  General: Pleasant, NAD Psych: Normal affect. Neuro: Alert and oriented X 3. Moves all extremities spontaneously. HEENT: Normal  Neck: Supple without bruits or JVD. Lungs:  Resp regular and unlabored, CTA. Heart: RRR no s3, s4, or murmurs. Abdomen: Soft, non-tender, non-distended, BS + x 4.  Extremities: No clubbing, cyanosis or edema. Decreased distal pulses bilaterally.  Labs  Troponin [facility] Test)  Recent Labs  01/24/15 1341  TROPIPOC 0.02   No results for input(s): CKTOTAL, CKMB, TROPONINI in the last 72 hours. Lab Results  Component Value Date   WBC 3.9* 01/24/2015   HGB 13.9 01/24/2015   HCT 41.0 01/24/2015   MCV 92.7 01/24/2015   PLT 217 01/24/2015    Recent Labs Lab 01/24/15 1337 01/24/15 1343  NA 144 143  K 3.8 3.7  CL 107 106  CO2 25  --   BUN 17 19  CREATININE 1.05* 1.00  CALCIUM 10.6*  --   PROT 6.7  --   BILITOT 0.5  --   ALKPHOS 70  --   ALT 19  --   AST 26  --   GLUCOSE 92 91   Lab Results  Component Value Date   CHOL 204* 01/25/2015   HDL 51 01/25/2015   LDLCALC 122* 01/25/2015   TRIG 156* 01/25/2015   Lab Results  Component Value Date   DDIMER 0.87* 07/26/2013     Radiology/Studies  Dg Chest 2 View  01/25/2015  CLINICAL DATA:  Generalized weakness. Recent transient ischemic attack EXAM: CHEST  2 VIEW COMPARISON:  Chest radiograph March 05, 2013; chest CT March 05, 2013 FINDINGS: Mild generalized interstitial prominence is stable. There is no frank edema or consolidation. There is slight cardiomegaly with slight pulmonary venous hypertension. No adenopathy apparent. There is degenerative change in the thoracic spine. IMPRESSION: Findings consistent with a degree  of pulmonary vascular congestion without frank edema or consolidation. No change in cardiac silhouette. Electronically Signed   By: Lowella Grip III M.D.   On: 01/25/2015 07:58   Ct Head Wo Contrast  01/25/2015  CLINICAL DATA:  TIA status post tPA. EXAM: CT HEAD WITHOUT CONTRAST TECHNIQUE: Contiguous axial images were obtained from the base of the skull through the vertex without intravenous contrast. COMPARISON:  01/24/2015 head CT. FINDINGS: No evidence of parenchymal hemorrhage or extra-axial fluid collection. No mass lesion, mass effect, or midline shift. No CT evidence of acute infarction. Mild intracranial atherosclerosis. Nonspecific stable subcortical and periventricular white matter hypodensity, most in keeping with chronic small vessel ischemic change. Mild diffuse cerebral volume loss. No ventriculomegaly. The visualized paranasal sinuses are essentially clear. The mastoid air cells are unopacified. No evidence of calvarial fracture. IMPRESSION: 1.  No evidence of acute intracranial abnormality. 2. Mild cerebral volume loss and mild chronic small vessel ischemic white matter change. Electronically Signed   By: Ilona Sorrel M.D.   On: 01/25/2015 08:01   Ct Head Wo Contrast  01/24/2015  ADDENDUM REPORT: 01/24/2015 13:55 ADDENDUM: Critical Value/emergent results were called by telephone at the time of interpretation on 01/24/2015 at 1:54 pm to Dr. Aram Beecham, neurology , who verbally acknowledged these results. Electronically Signed   By: Lowella Grip III M.D.   On: 01/24/2015 13:55  01/24/2015  CLINICAL DATA:  Acute onset right-sided weakness and facial droop EXAM: CT HEAD WITHOUT CONTRAST TECHNIQUE: Contiguous axial images were obtained from the base of the skull through the vertex without intravenous contrast. COMPARISON:  April 24, 2011 FINDINGS: Mild diffuse atrophy is stable. There is no intracranial mass, hemorrhage, extra-axial fluid collection, or midline shift. There is mild small  vessel disease in the centra semiovale bilaterally. Elsewhere gray-white compartments appear normal. No acute infarct identified. Middle cerebral artery attenuation is symmetric and normal bilaterally. Bony calvarium appears intact. Visualized mastoid air cells are clear. There is opacification in several ethmoid air cells. No intraorbital lesions are appreciable. IMPRESSION: Stable atrophy  with patchy periventricular small vessel disease. No intracranial mass, hemorrhage, or acute appearing infarct. There is opacification of several left-sided ethmoid air cells. Electronically Signed: By: Lowella Grip III M.D. On: 01/24/2015 13:52   Mr Brain Wo Contrast  01/25/2015  CLINICAL DATA:  Right-sided weakness and slurred speech at primary care office today. Symptoms nearly resolved at time of presentation. EXAM: MRI HEAD WITHOUT CONTRAST MRA HEAD WITHOUT CONTRAST TECHNIQUE: Multiplanar, multiecho pulse sequences of the brain and surrounding structures were obtained without intravenous contrast. Angiographic images of the head were obtained using MRA technique without contrast. COMPARISON:  None. FINDINGS: MRI HEAD FINDINGS Calvarium and upper cervical spine: Cervical spine degeneration without visible cord compression. No focal marrow signal abnormality. Orbits: Negative. Sinuses and Mastoids: Clear. Brain: Diffusion signal in the left cortical spinal tract is roughly symmetric, especially on coronal DWI acquisition. There is no convincing acute infarct. No hemorrhage, hydrocephalus, or major vessel occlusion. No evidence of mass lesion. Generalized cerebral volume loss which is age congruent, as is small-vessel ischemic change in the periventricular white matter. 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 Fairly  symmetric vertebral arteries with dominant right AICA and left PICA. Isolated left MCA show superimposed high-grade narrowing with flow gap in the inferior and superior division branches at the M1-M2 junction. Given clustered appearance and proximity to imaging slab artifact is considered, but there is no artifact elsewhere on the scan at this level. Left MCA branches have an attenuated appearance compared to the right from slow or underfilling. There is also multifocal narrowing of bilateral medium-sized arteries, worse in the anterior circulation, consistent with atherosclerosis. Some of the stenoses are high-grade. There is no dilatation/beading beyond the stenoses. In the posterior circulation, beginning at the P2 segments there is bilateral intermittent atherosclerotic type narrowing which is most proximal and high-grade at the left P2-P3 junction. A 1-2 mm bulge medially from the anterior genu of the right carotid siphon could be atherosclerotic, infundibular, or aneurysmal. IMPRESSION: 1. Negative for acute infarct. 2. Extensive intracranial atherosclerosis. High-grade left M1 -M2 branch stenosis is the most proximal and pertinent to the reported deficits. Please see further description above. Electronically Signed   By: Monte Fantasia M.D.   On: 01/25/2015 07:14   Mr Jodene Nam Head/brain Wo Cm  01/25/2015  CLINICAL DATA:  Right-sided weakness and slurred speech at primary care office today. Symptoms nearly resolved at time of presentation. EXAM: MRI HEAD WITHOUT CONTRAST MRA HEAD WITHOUT CONTRAST TECHNIQUE: Multiplanar, multiecho pulse sequences of the brain and surrounding structures were obtained without intravenous contrast. Angiographic images of the head were obtained using MRA technique without contrast. COMPARISON:  None. FINDINGS: MRI HEAD FINDINGS Calvarium and upper cervical spine: Cervical spine degeneration without visible cord compression. No focal marrow signal abnormality. Orbits: Negative.  Sinuses and Mastoids: Clear. Brain: Diffusion signal in the left cortical spinal tract is roughly symmetric, especially on coronal DWI acquisition. There is no convincing acute infarct. No hemorrhage, hydrocephalus, or major vessel occlusion. No evidence of mass lesion. Generalized cerebral volume loss which is age congruent, as is small-vessel ischemic change in the periventricular white matter. 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 Fairly symmetric vertebral arteries with dominant right AICA and left PICA. Isolated left MCA show superimposed high-grade narrowing with flow gap in the inferior and superior division branches at the M1-M2 junction. Given clustered appearance and proximity to imaging slab artifact is considered, but there is no artifact elsewhere on the scan at this level. Left MCA branches have an attenuated appearance compared to the right from slow or underfilling. There is also multifocal narrowing of bilateral medium-sized arteries, worse in the anterior circulation, consistent with atherosclerosis. Some of the stenoses are high-grade. There is no dilatation/beading beyond the stenoses. In the posterior circulation, beginning at the P2 segments there is bilateral intermittent atherosclerotic type narrowing which is most proximal and high-grade at the left P2-P3 junction. A 1-2 mm bulge medially from the anterior genu of the right carotid siphon could be atherosclerotic, infundibular, or aneurysmal. IMPRESSION: 1. Negative for acute infarct. 2. Extensive intracranial atherosclerosis. High-grade left M1 -M2 branch stenosis is the most proximal and pertinent to the reported deficits. Please see further description above. Electronically Signed   By: Monte Fantasia M.D.   On: 01/25/2015 07:14     ECG  NSR 67 bpm   Echocardiogram 01/25/15  Study Conclusions  - Left ventricle: The cavity size was normal. Wall thickness was increased in a pattern of moderate LVH. Systolic function was moderately to severely reduced. The estimated ejection fraction was in the range of 30% to 35%. Diffuse hypokinesis. Doppler parameters are consistent with abnormal left ventricular relaxation (grade 1 diastolic dysfunction). - Mitral valve: Calcified annulus. - Left atrium: The atrium was severely dilated. - Pulmonary arteries: Systolic pressure was mildly increased. PA peak pressure: 38 mm Hg (S).  Impressions:  - Moderate to severe global reduction in LV function; moderate LVH; grade 1 diastolic dysfunction; severe LAE; trace MR and TR; mildly elevated pulmonary pressure.       ASSESSMENT AND PLAN  Principal Problem:   TIA (transient ischemic attack) Active Problems:   OSA (obstructive sleep apnea)   Anxiety   GERD (gastroesophageal reflux disease)   Hypertension   Obesity (BMI 30-39.9)   Chronic diastolic CHF (congestive heart failure) ([place])    1. Abnormal Echo/Reduced EF: Recent echo following TIA showed reduced EF, down to 30-35%, previously 50-55% on prior echo in 2015. Recent echo also shows diffuse hypokinesis. EKG shows NSR w/o any acute abnormalities. Enzymes not cycled. She denies any recent CP but has noticed developed of mild dyspnea on exertion with day to day activities around her house. No resting dyspnea. Exam is notable for only mild, trace LEE on the left LE. Given unexplained drop in EF, she will need ischemic w/u to r/o underlying ischemia. Given her recent TIA, may opt to do stress test as an outpatient. Patient to be seen by her primary cardiologist, Dr. Radford Pax, who will ultimately decide. Continue BB and ARB.   2. TIA: carotid dopplers pending. If no significant disease, ? TEE +/- loop vs 30 day monitor to assess for LA thrombus and  potential atrial fibrillation/flutter.   3. HTN: her BP is poorly controlled. Continue BB and ARB given reduced EF. There is no room to increase BB given borderline bradycardia. Recommend adding hydralazine + nitrate for better control.   4. ? Claudication: patient mentions a long history of pain in both legs with ambulation. Symptoms improve with rest. She had decreased distal pulses bilaterally. No h/o DM or tobacco use. We can arrange outpatient arterial dopplers to assess  for PVD. We will follow in clinic.    Janee Morn, PA-C 01/26/2015, 11:29 AM'

## 2015-01-26 NOTE — Progress Notes (Signed)
Physical Therapy Treatment Patient Details Name: Susan [name] MRN: [id number] DOB: 10/22/1940 Today's Date: 01/26/2015    History of Present Illness Susan [name] is a very pleasant 75 y.o. female past medical history includes, hyperlipidemia, thyroid disease, chronic low back pain, anxiety, GERD, hypertension, chronic diastolic heart failure presents to the emergency department with chief complaint of sudden right-sided weakness slurred speech and unsteady gait. Symptoms resolved at the time of presentation, patient was admitted for TIA workup.    PT Comments    Patient educated in use of RW (along with her grandaughter to assist with carryover). Continues with unsteady transfers and gait and will benefit from HHPT.  Follow Up Recommendations  Home health PT     Equipment Recommendations  Rolling walker with 5" wheels    Recommendations for Other Services       Precautions / Restrictions Precautions Precautions: Fall Restrictions Weight Bearing Restrictions: No    Mobility  Bed Mobility Overal bed mobility: Modified Independent             General bed mobility comments: incr effort  Transfers Overall transfer level: Needs assistance Equipment used: Rolling walker (2 wheeled) Transfers: Sit to/from Stand Sit to Stand: Min assist         General transfer comment: x2; vc for technique, leans Posterior as achieving upright   Ambulation/Gait Ambulation/Gait assistance: Min guard Ambulation Distance (Feet): 80 Feet Assistive device: Rolling walker (2 wheeled) Gait Pattern/deviations: Step-through pattern;Decreased stride length;Shuffle;Trunk flexed   Gait velocity interpretation: Below normal speed for age/gender General Gait Details: vc for proper use of RW. Grandaughter present and also instructed for carryover   Stairs            Wheelchair Mobility    Modified Rankin (Stroke Patients Only) Modified Rankin (Stroke Patients Only) Pre-Morbid Rankin  Score: No significant disability Modified Rankin: Slight disability     Balance     Sitting balance-Leahy Scale: Fair     Standing balance support: No upper extremity supported Standing balance-Leahy Scale: Fair                      Cognition Arousal/Alertness: Awake/alert Behavior During Therapy: WFL for tasks assessed/performed Overall Cognitive Status: History of cognitive impairments - at baseline                      Exercises      General Comments General comments (skin integrity, edema, etc.): Multiple questions re: use of RW. Agreed not to use cane      Pertinent Vitals/Pain      Home Living                      Prior Function            PT Goals (current goals can now be found in the care plan section) Acute Rehab PT Goals Patient Stated Goal: to go home PT Goal Formulation: With patient/family Time For Goal Achievement: 02/01/15 Potential to Achieve Goals: Good Progress towards PT goals: Progressing toward goals    Frequency  Min 4X/week    PT Plan Current plan remains appropriate    Co-evaluation             End of Session   Activity Tolerance: Patient tolerated treatment well Patient left: in chair;with call bell/phone within reach;with family/visitor present;with chair alarm set     Time: [id number] PT Time Calculation (min) (ACUTE ONLY): 40 min  Charges:  $Gait Training: 38-52 mins                    G Codes:      Korin Setzler 01-29-2015, 1:34 PM Pager [phone]

## 2015-01-28 ENCOUNTER — Inpatient Hospital Stay (HOSPITAL_COMMUNITY)
Admission: EM | Admit: 2015-01-28 | Discharge: 2015-01-31 | DRG: 064 | Disposition: A | Discharge status: Skilled Nursing Facility | Payer: PPO | Attending: Internal Medicine | Admitting: Internal Medicine

## 2015-01-28 ENCOUNTER — Encounter (HOSPITAL_COMMUNITY): Payer: Self-pay | Admitting: Emergency Medicine

## 2015-01-28 ENCOUNTER — Emergency Department (HOSPITAL_COMMUNITY): Payer: PPO

## 2015-01-28 ENCOUNTER — Telehealth: Payer: Self-pay | Admitting: Physician Assistant

## 2015-01-28 DIAGNOSIS — G4733 Obstructive sleep apnea (adult) (pediatric): Secondary | ICD-10-CM | POA: Diagnosis not present

## 2015-01-28 DIAGNOSIS — F419 Anxiety disorder, unspecified: Secondary | ICD-10-CM | POA: Diagnosis present

## 2015-01-28 DIAGNOSIS — I110 Hypertensive heart disease with heart failure: Secondary | ICD-10-CM | POA: Diagnosis not present

## 2015-01-28 DIAGNOSIS — I480 Paroxysmal atrial fibrillation: Secondary | ICD-10-CM | POA: Diagnosis not present

## 2015-01-28 DIAGNOSIS — R29701 NIHSS score 1: Secondary | ICD-10-CM | POA: Diagnosis not present

## 2015-01-28 DIAGNOSIS — R29810 Facial weakness: Secondary | ICD-10-CM | POA: Diagnosis present

## 2015-01-28 DIAGNOSIS — Z79899 Other long term (current) drug therapy: Secondary | ICD-10-CM | POA: Diagnosis not present

## 2015-01-28 DIAGNOSIS — G8191 Hemiplegia, unspecified affecting right dominant side: Secondary | ICD-10-CM | POA: Diagnosis not present

## 2015-01-28 DIAGNOSIS — E785 Hyperlipidemia, unspecified: Secondary | ICD-10-CM | POA: Diagnosis not present

## 2015-01-28 DIAGNOSIS — G8929 Other chronic pain: Secondary | ICD-10-CM | POA: Diagnosis not present

## 2015-01-28 DIAGNOSIS — Z8673 Personal history of transient ischemic attack (TIA), and cerebral infarction without residual deficits: Secondary | ICD-10-CM | POA: Diagnosis not present

## 2015-01-28 DIAGNOSIS — F329 Major depressive disorder, single episode, unspecified: Secondary | ICD-10-CM | POA: Diagnosis not present

## 2015-01-28 DIAGNOSIS — R269 Unspecified abnormalities of gait and mobility: Secondary | ICD-10-CM | POA: Diagnosis not present

## 2015-01-28 DIAGNOSIS — R600 Localized edema: Secondary | ICD-10-CM | POA: Diagnosis not present

## 2015-01-28 DIAGNOSIS — R531 Weakness: Secondary | ICD-10-CM | POA: Diagnosis not present

## 2015-01-28 DIAGNOSIS — E669 Obesity, unspecified: Secondary | ICD-10-CM | POA: Diagnosis not present

## 2015-01-28 DIAGNOSIS — M545 Low back pain: Secondary | ICD-10-CM | POA: Diagnosis not present

## 2015-01-28 DIAGNOSIS — Z87891 Personal history of nicotine dependence: Secondary | ICD-10-CM | POA: Diagnosis not present

## 2015-01-28 DIAGNOSIS — R471 Dysarthria and anarthria: Secondary | ICD-10-CM | POA: Diagnosis present

## 2015-01-28 DIAGNOSIS — M79609 Pain in unspecified limb: Secondary | ICD-10-CM | POA: Diagnosis not present

## 2015-01-28 DIAGNOSIS — G629 Polyneuropathy, unspecified: Secondary | ICD-10-CM | POA: Diagnosis present

## 2015-01-28 DIAGNOSIS — R413 Other amnesia: Secondary | ICD-10-CM | POA: Diagnosis present

## 2015-01-28 DIAGNOSIS — I119 Hypertensive heart disease without heart failure: Secondary | ICD-10-CM | POA: Diagnosis present

## 2015-01-28 DIAGNOSIS — R4781 Slurred speech: Secondary | ICD-10-CM | POA: Diagnosis not present

## 2015-01-28 DIAGNOSIS — K219 Gastro-esophageal reflux disease without esophagitis: Secondary | ICD-10-CM | POA: Diagnosis not present

## 2015-01-28 DIAGNOSIS — I5030 Unspecified diastolic (congestive) heart failure: Secondary | ICD-10-CM | POA: Diagnosis present

## 2015-01-28 DIAGNOSIS — Z96651 Presence of right artificial knee joint: Secondary | ICD-10-CM | POA: Diagnosis present

## 2015-01-28 DIAGNOSIS — E869 Volume depletion, unspecified: Secondary | ICD-10-CM | POA: Insufficient documentation

## 2015-01-28 DIAGNOSIS — I10 Essential (primary) hypertension: Secondary | ICD-10-CM | POA: Diagnosis not present

## 2015-01-28 DIAGNOSIS — F0390 Unspecified dementia without behavioral disturbance: Secondary | ICD-10-CM | POA: Diagnosis not present

## 2015-01-28 DIAGNOSIS — M79669 Pain in unspecified lower leg: Secondary | ICD-10-CM

## 2015-01-28 DIAGNOSIS — I6789 Other cerebrovascular disease: Secondary | ICD-10-CM | POA: Diagnosis not present

## 2015-01-28 DIAGNOSIS — I255 Ischemic cardiomyopathy: Secondary | ICD-10-CM | POA: Diagnosis not present

## 2015-01-28 DIAGNOSIS — E210 Primary hyperparathyroidism: Secondary | ICD-10-CM | POA: Diagnosis present

## 2015-01-28 DIAGNOSIS — Z6838 Body mass index (BMI) 38.0-38.9, adult: Secondary | ICD-10-CM | POA: Diagnosis not present

## 2015-01-28 DIAGNOSIS — E039 Hypothyroidism, unspecified: Secondary | ICD-10-CM | POA: Diagnosis not present

## 2015-01-28 DIAGNOSIS — I69359 Hemiplegia and hemiparesis following cerebral infarction affecting unspecified side: Secondary | ICD-10-CM | POA: Insufficient documentation

## 2015-01-28 DIAGNOSIS — Z7982 Long term (current) use of aspirin: Secondary | ICD-10-CM | POA: Diagnosis not present

## 2015-01-28 DIAGNOSIS — I2510 Atherosclerotic heart disease of native coronary artery without angina pectoris: Secondary | ICD-10-CM | POA: Diagnosis not present

## 2015-01-28 DIAGNOSIS — I639 Cerebral infarction, unspecified: Secondary | ICD-10-CM | POA: Diagnosis not present

## 2015-01-28 DIAGNOSIS — I4819 Other persistent atrial fibrillation: Secondary | ICD-10-CM | POA: Diagnosis present

## 2015-01-28 DIAGNOSIS — I5033 Acute on chronic diastolic (congestive) heart failure: Secondary | ICD-10-CM | POA: Diagnosis present

## 2015-01-28 DIAGNOSIS — I5032 Chronic diastolic (congestive) heart failure: Secondary | ICD-10-CM | POA: Diagnosis present

## 2015-01-28 DIAGNOSIS — IMO0002 Reserved for concepts with insufficient information to code with codable children: Secondary | ICD-10-CM | POA: Insufficient documentation

## 2015-01-28 DIAGNOSIS — I5042 Chronic combined systolic (congestive) and diastolic (congestive) heart failure: Secondary | ICD-10-CM | POA: Diagnosis not present

## 2015-01-28 LAB — I-STAT CHEM 8, ED
BUN: 25 mg/dL — ABNORMAL HIGH (ref 6–20)
CHLORIDE: 105 mmol/L (ref 101–111)
Calcium, Ion: 1.31 mmol/L — ABNORMAL HIGH (ref 1.13–1.30)
Creatinine, Ser: 1.1 mg/dL — ABNORMAL HIGH (ref 0.44–1.00)
Glucose, Bld: 92 mg/dL (ref 65–99)
HEMATOCRIT: 42 % (ref 36.0–46.0)
HEMOGLOBIN: 14.3 g/dL (ref 12.0–15.0)
POTASSIUM: 4.5 mmol/L (ref 3.5–5.1)
Sodium: 143 mmol/L (ref 135–145)
TCO2: 26 mmol/L (ref 0–100)

## 2015-01-28 LAB — I-STAT TROPONIN, ED: TROPONIN I, POC: 0.02 ng/mL (ref 0.00–0.08)

## 2015-01-28 LAB — DIFFERENTIAL
BASOS ABS: 0 10*3/uL (ref 0.0–0.1)
Basophils Relative: 1 %
EOS ABS: 0.1 10*3/uL (ref 0.0–0.7)
Eosinophils Relative: 3 %
LYMPHS ABS: 1.1 10*3/uL (ref 0.7–4.0)
Lymphocytes Relative: 34 %
MONOS PCT: 11 %
Monocytes Absolute: 0.4 10*3/uL (ref 0.1–1.0)
Neutro Abs: 1.7 10*3/uL (ref 1.7–7.7)
Neutrophils Relative %: 51 %

## 2015-01-28 LAB — RAPID URINE DRUG SCREEN, HOSP PERFORMED
Amphetamines: NOT DETECTED
BARBITURATES: NOT DETECTED
Benzodiazepines: NOT DETECTED
COCAINE: NOT DETECTED
Opiates: NOT DETECTED
Tetrahydrocannabinol: NOT DETECTED

## 2015-01-28 LAB — URINALYSIS, ROUTINE W REFLEX MICROSCOPIC
BILIRUBIN URINE: NEGATIVE
GLUCOSE, UA: NEGATIVE mg/dL
HGB URINE DIPSTICK: NEGATIVE
KETONES UR: NEGATIVE mg/dL
NITRITE: NEGATIVE
PH: 5 (ref 5.0–8.0)
PROTEIN: NEGATIVE mg/dL
Specific Gravity, Urine: 1.012 (ref 1.005–1.030)

## 2015-01-28 LAB — CBC
HEMATOCRIT: 39.1 % (ref 36.0–46.0)
HEMOGLOBIN: 13.2 g/dL (ref 12.0–15.0)
MCH: 31.9 pg (ref 26.0–34.0)
MCHC: 33.8 g/dL (ref 30.0–36.0)
MCV: 94.4 fL (ref 78.0–100.0)
Platelets: 218 10*3/uL (ref 150–400)
RBC: 4.14 MIL/uL (ref 3.87–5.11)
RDW: 13.1 % (ref 11.5–15.5)
WBC: 3.3 10*3/uL — ABNORMAL LOW (ref 4.0–10.5)

## 2015-01-28 LAB — PROTIME-INR
INR: 1.23 (ref 0.00–1.49)
PROTHROMBIN TIME: 15.6 s — AB (ref 11.6–15.2)

## 2015-01-28 LAB — COMPREHENSIVE METABOLIC PANEL
ALT: 20 U/L (ref 14–54)
AST: 29 U/L (ref 15–41)
Albumin: 3.9 g/dL (ref 3.5–5.0)
Alkaline Phosphatase: 62 U/L (ref 38–126)
Anion gap: 10 (ref 5–15)
BILIRUBIN TOTAL: 1.2 mg/dL (ref 0.3–1.2)
BUN: 17 mg/dL (ref 6–20)
CO2: 25 mmol/L (ref 22–32)
CREATININE: 1.12 mg/dL — AB (ref 0.44–1.00)
Calcium: 10.1 mg/dL (ref 8.9–10.3)
Chloride: 107 mmol/L (ref 101–111)
GFR calc Af Amer: 53 mL/min — ABNORMAL LOW (ref >60)
GFR, EST NON AFRICAN AMERICAN: 46 mL/min — AB (ref >60)
Glucose, Bld: 92 mg/dL (ref 65–99)
POTASSIUM: 4.5 mmol/L (ref 3.5–5.1)
Sodium: 142 mmol/L (ref 135–145)
TOTAL PROTEIN: 6.7 g/dL (ref 6.5–8.1)

## 2015-01-28 LAB — URINE MICROSCOPIC-ADD ON
Bacteria, UA: NONE SEEN
RBC / HPF: NONE SEEN RBC/hpf (ref 0–5)

## 2015-01-28 LAB — APTT: APTT: 32 s (ref 24–37)

## 2015-01-28 LAB — ETHANOL: Alcohol, Ethyl (B): <5 mg/dL (ref <5)

## 2015-01-28 MED ORDER — EZETIMIBE 10 MG PO TABS
10.0000 mg | ORAL_TABLET | Freq: Every day | ORAL | Status: DC
  Administered 2015-01-29 – 2015-01-31 (×3): 10 mg via ORAL
  Filled 2015-01-28 (×3): qty 1

## 2015-01-28 MED ORDER — BUPROPION HCL ER (XL) 300 MG PO TB24
300.0000 mg | ORAL_TABLET | Freq: Every day | ORAL | Status: DC
  Administered 2015-01-29 – 2015-01-31 (×3): 300 mg via ORAL
  Filled 2015-01-28 (×4): qty 1

## 2015-01-28 MED ORDER — VITAMIN B-12 100 MCG PO TABS
100.0000 ug | ORAL_TABLET | Freq: Every day | ORAL | Status: DC
  Administered 2015-01-28 – 2015-01-31 (×4): 100 ug via ORAL
  Filled 2015-01-28 (×4): qty 1

## 2015-01-28 MED ORDER — GABAPENTIN 300 MG PO CAPS
300.0000 mg | ORAL_CAPSULE | Freq: Every day | ORAL | Status: DC
  Administered 2015-01-28 – 2015-01-30 (×3): 300 mg via ORAL
  Filled 2015-01-28 (×3): qty 1

## 2015-01-28 MED ORDER — CARVEDILOL 3.125 MG PO TABS
3.1250 mg | ORAL_TABLET | Freq: Two times a day (BID) | ORAL | Status: DC
  Administered 2015-01-29 (×2): 3.125 mg via ORAL
  Filled 2015-01-28 (×3): qty 1

## 2015-01-28 MED ORDER — ADULT MULTIVITAMIN W/MINERALS CH
1.0000 | ORAL_TABLET | Freq: Every day | ORAL | Status: DC
  Administered 2015-01-28 – 2015-01-31 (×4): 1 via ORAL
  Filled 2015-01-28 (×4): qty 1

## 2015-01-28 MED ORDER — STROKE: EARLY STAGES OF RECOVERY BOOK
Freq: Once | Status: AC
  Administered 2015-01-28: 22:00:00
  Filled 2015-01-28: qty 1

## 2015-01-28 MED ORDER — PRAVASTATIN SODIUM 20 MG PO TABS
20.0000 mg | ORAL_TABLET | Freq: Every day | ORAL | Status: DC
  Administered 2015-01-29 – 2015-01-31 (×3): 20 mg via ORAL
  Filled 2015-01-28 (×3): qty 1

## 2015-01-28 MED ORDER — APIXABAN 5 MG PO TABS
5.0000 mg | ORAL_TABLET | Freq: Two times a day (BID) | ORAL | Status: DC
  Administered 2015-01-28 – 2015-01-31 (×6): 5 mg via ORAL
  Filled 2015-01-28 (×6): qty 1

## 2015-01-28 MED ORDER — CLOPIDOGREL BISULFATE 75 MG PO TABS: 75.0000 mg | ORAL_TABLET | Freq: Every day | ORAL | Status: DC

## 2015-01-28 MED ORDER — IRBESARTAN 300 MG PO TABS
300.0000 mg | ORAL_TABLET | Freq: Every day | ORAL | Status: DC
  Administered 2015-01-29 – 2015-01-31 (×3): 300 mg via ORAL
  Filled 2015-01-28 (×3): qty 1

## 2015-01-28 MED ORDER — VITAMIN D 1000 UNITS PO TABS
1000.0000 [IU] | ORAL_TABLET | Freq: Every day | ORAL | Status: DC
  Administered 2015-01-29 – 2015-01-31 (×3): 1000 [IU] via ORAL
  Filled 2015-01-28 (×4): qty 1

## 2015-01-28 MED ORDER — SODIUM CHLORIDE 0.9 % IV BOLUS (SEPSIS)
250.0000 mL | Freq: Once | INTRAVENOUS | Status: AC
  Administered 2015-01-28: 250 mL via INTRAVENOUS

## 2015-01-28 NOTE — ED Notes (Signed)
Reports admitted on Tuesday for TIA; states the feeling of uncoordination and the slurred speech have continued and seem to be getting worse. Stated she felt more off balance this morning. EMS spoke with daughter by phone and said the speech is the same. Patient expressing concerns about taking care of herself with the coordination issues.

## 2015-01-28 NOTE — Consult Note (Signed)
Referring Physician: Dr Winfred Leeds Chief Complaint: worsening weakness, inability to walk  HPI:                                                                                                                                         Susan [name] is an 75 y.o. female with a past medical history significant for HLD, HTN, chronic congestive heart failure, hypothyroidism, OSA, depression, anxiety, brought in by family due to the above stated symptoms. Of note, patient was released from [facility] on 1/19 with diagnosis of TIA (had dizziness and right sided weakness) and had comprehensive TIA work up that was significant only for TEE with EF 30-35%, severly dilated left atrium, diffuse hypokinesis, but no mural thrombus seen. She was discharged home on apixaban. Patient lives by herself and her daughter is at the bedside stating that she talked to her mother on the phone last night and her speech was not normal " like having a thick tongue". Then, this morning she noted that the speech was worse, the right face was droop, and she couldn't use the right arm properly. Similarly, patient was so weak this morning that couldn't participate on her home PT session. Denies HA, vertigo, double vision, difficulty swallowing, or vision impairment. Endorses feeling very fatigue since yesterday. 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  Date last known well: unclear Time last known well: unclear Tpa given: no, late presentation    Past Medical History  Diagnosis Date  . Hyperlipidemia   . Pedal edema   . Spinal stenosis   . Varicose veins     "BLE" (07/29/2012)  . Primary hyperparathyroidism ([place])   . Thyroid disease   . Incontinent of urine     wears pads  . Family history of anesthesia complication     "some wake up during OR; some are hard to wake up; some both" (07/29/2012)  . OSA on CPAP   . Arthritis     "legs, back" (07/29/2012)   . Osteoarthritis of right knee   . Umbilical hernia     "unrepaired" (07/29/2012)  . Chronic lower back pain   . History of stomach ulcers 1980's  . Anxiety   . Depression   . GERD (gastroesophageal reflux disease)   . Hypertension   . Chronic diastolic CHF (congestive heart failure) ([place])   . Complication of anesthesia     "I have apnea" (07/29/2012)  . Dementia     per family    Past Surgical History  Procedure Laterality Date  . Iud removal  1980's  . Parathyroidectomy N/A 06/16/2012    Procedure: NECK EXPLORATION AND LEFT SUPERIOR PARATHYROIDECTOMY;  Surgeon: Earnstine Regal, MD;  Location: [facility];  Service: General;  Laterality: N/A;  . Colonoscopy    . 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 Cholecystectomy  ~  2010  . Total knee arthroplasty Right 07/29/2012    Procedure: TOTAL KNEE ARTHROPLASTY;  Surgeon: Ninetta Lights, MD;  Location: [place];  Service: Orthopedics;  Laterality: Right;    Family History  Problem Relation Age of Onset  . Hypertension Mother     deceased  . Lung cancer Father     deceased  . Stroke Mother    Social History:  reports that she quit smoking about 37 years ago. Her smoking use included Cigarettes. She has a 30 pack-year smoking history. She has never used smokeless tobacco. She reports that she does not drink alcohol or use illicit drugs.  Family history: no MS, epilepsy, or brain tumor  Allergies:  Allergies  Allergen Reactions  . Lipitor [Atorvastatin] Swelling  . Simvastatin Other (See Comments)    Myalgias     . Ace Inhibitors Cough    Medications:                                                                                                                           I have reviewed the patient's current medications.  ROS:                                                                                                                                       History obtained from chart review and family   General ROS:  negative for - chills, fever, night sweats, weight gain or weight loss Psychological ROS: negative for - behavioral disorder, hallucinations, or suicidal ideation Ophthalmic ROS: negative for - blurry vision, double vision, eye pain or loss of vision ENT ROS: negative for - epistaxis, nasal discharge, oral lesions, sore throat, tinnitus or vertigo Allergy and Immunology ROS: negative for - hives or itchy/watery eyes Hematological and Lymphatic ROS: negative for - bleeding problems, bruising or swollen lymph nodes Endocrine ROS: negative for - galactorrhea, hair pattern changes, polydipsia/polyuria or temperature intolerance Respiratory ROS: negative for - cough, hemoptysis, shortness of breath or wheezing Cardiovascular ROS: negative for - chest pain, dyspnea on exertion, edema or irregular heartbeat Gastrointestinal ROS: negative for - abdominal pain, diarrhea, hematemesis, nausea/vomiting or stool incontinence Genito-Urinary ROS: negative for - dysuria, hematuria, incontinence or urinary frequency/urgency Musculoskeletal ROS: negative for - joint swelling Neurological ROS: as noted in HPI Dermatological ROS: negative for rash and skin lesion changes  Physical exam:  Constitutional: well developed,  pleasant female in no apparent distress. Blood pressure 152/82, pulse 60, temperature 97.8 F (36.6 C), temperature source Oral, resp. rate 16, height 5\' 2"  (1.575 m), weight 92.08 kg (203 lb), SpO2 99 %. Eyes: no jaundice or exophthalmos.  Head: normocephalic. Neck: supple, no bruits, no JVD. Cardiac: no murmurs. Lungs: clear. Abdomen: soft, no tender, no mass. Extremities: no edema, clubbing, or cyanosis.  Skin: no rash  Neurologic Examination:                                                                                                      General: NAD Mental Status: Alert, oriented, thought content appropriate. Mild dysarthria without evidence of aphasia.  Able to follow 3 step  commands without difficulty. Cranial Nerves: II: Discs flat bilaterally; Visual fields grossly normal, pupils equal, round, reactive to light and accommodation III,IV, VI: ptosis not present, extra-ocular motions intact bilaterally V,VII: smile asymmetric due to mild dysasrthria, facial light touch sensation normal bilaterally VIII: hearing normal bilaterally IX,X: uvula rises symmetrically XI: bilateral shoulder shrug XII: midline tongue extension without atrophy or fasciculations  Motor: Right arm drift .Subtle right LE weakness Tone and bulk:normal tone throughout; no atrophy noted Sensory: Pinprick and light touch intact throughout, bilaterally Deep Tendon Reflexes:  Right: Upper Extremity   Left: Upper extremity   biceps (C-5 to C-6) 2/4   biceps (C-5 to C-6) 2/4 tricep (C7) 2/4    triceps (C7) 2/4 Brachioradialis (C6) 2/4  Brachioradialis (C6) 2/4  Lower Extremity Lower Extremity  quadriceps (L-2 to L-4) 2/4   quadriceps (L-2 to L-4) 2/4 Achilles (S1) 2/4   Achilles (S1) 2/4  Plantars: Right: downgoing   Left: downgoing Cerebellar: normal finger-to-nose,  normal heel-to-shin test Gait:  No tested due to multiple leads    Results for orders placed or performed during the hospital encounter of 01/28/15 (from the past 48 hour(s))  CBC     Status: Abnormal   Collection Time: 01/28/15 12:22 PM  Result Value Ref Range   WBC 3.3 (L) 4.0 - 10.5 K/uL   RBC 4.14 3.87 - 5.11 MIL/uL   Hemoglobin 13.2 12.0 - 15.0 g/dL   HCT 39.1 36.0 - 46.0 %   MCV 94.4 78.0 - 100.0 fL   MCH 31.9 26.0 - 34.0 pg   MCHC 33.8 30.0 - 36.0 g/dL   RDW 13.1 11.5 - 15.5 %   Platelets 218 150 - 400 K/uL  Differential     Status: None   Collection Time: 01/28/15 12:22 PM  Result Value Ref Range   Neutrophils Relative % 51 %   Neutro Abs 1.7 1.7 - 7.7 K/uL   Lymphocytes Relative 34 %   Lymphs Abs 1.1 0.7 - 4.0 K/uL   Monocytes Relative 11 %   Monocytes Absolute 0.4 0.1 - 1.0 K/uL   Eosinophils  Relative 3 %   Eosinophils Absolute 0.1 0.0 - 0.7 K/uL   Basophils Relative 1 %   Basophils Absolute 0.0 0.0 - 0.1 K/uL  I-stat troponin, ED (not at [facility], [facility])     Status: None  Collection Time: 01/28/15 12:29 PM  Result Value Ref Range   Troponin i, poc 0.02 0.00 - 0.08 ng/mL   Comment 3            Comment: Due to the release kinetics of cTnI, a negative result within the first hours of the onset of symptoms does not rule out myocardial infarction with certainty. If myocardial infarction is still suspected, repeat the test at appropriate intervals.   I-Stat Chem 8, ED  (not at [facility], [facility])     Status: Abnormal   Collection Time: 01/28/15 12:31 PM  Result Value Ref Range   Sodium 143 135 - 145 mmol/L   Potassium 4.5 3.5 - 5.1 mmol/L   Chloride 105 101 - 111 mmol/L   BUN 25 (H) 6 - 20 mg/dL   Creatinine, Ser 1.10 (H) 0.44 - 1.00 mg/dL   Glucose, Bld 92 65 - 99 mg/dL   Calcium, Ion 1.31 (H) 1.13 - 1.30 mmol/L   TCO2 26 0 - 100 mmol/L   Hemoglobin 14.3 12.0 - 15.0 g/dL   HCT 42.0 36.0 - 46.0 %   No results found.  Assessment: 75 y.o. female with recent TIA released from [facility] [place] 1/19, returns to the ED with dysarthria, right face-arm weakness and decreased stamina. 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 TIA work up that was significant for TEE with EF 30-35%, severly dilated left atrium, diffuse hypokinesis, but no mural thrombus seen.She was discharged home on apixaban. Patient on apixaban but likely this is a subcortical infarct in the context of small vessel disease and thus apixaban won't be efficacious for secondary stroke prevention. The concern here is adding an antiplatelet agent to apixaban which certainly increases the risk of bleeding. significant  Admit to medicine. She passed swallowing evaluation at the bedside. Stroke team will resume care tomorrow.  Stroke Risk Factors - age, HLD, HTN, chronic congestive heart  failure, recent TIA    Dorian Pod, MD [facility] [phone]  01/28/2015, 12:53 PM

## 2015-01-28 NOTE — ED Notes (Signed)
Attempted report x1. 

## 2015-01-28 NOTE — Telephone Encounter (Signed)
Patient's family called answering service. They are in the ER and requesting we write orders for patient to be admitted for what they believe to be another TIA. Had issues with worsening weakness this morning with PT. They are presently in the ER awaiting further workup. I told them typically cardiology does not admit for acute neurologic issues but we are happy to consult if there are felt to be active heart issues. I told them the ER doctor will be the one to lead the workup from here and will likely contact neurology or internal medicine for further eval first. Her family verbalized understanding and gratitude. Khushboo Chuck PA-C

## 2015-01-28 NOTE — H&P (Signed)
[facility] History and Physical                                                                                    Susan [name], is a 75 y.o. female  MRN: [id number]   DOB - 1940/03/11  Admit Date - 01/28/2015  Outpatient Primary MD for the patient is [name], MD  Referring MD: Winfred Leeds / ER  Consulting M.D: Armida Sans / Neurology  PMH: Past Medical History  Diagnosis Date  . Hyperlipidemia   . Pedal edema   . Spinal stenosis   . Varicose veins     "BLE" (07/29/2012)  . Primary hyperparathyroidism ([place])   . Thyroid disease   . Incontinent of urine     wears pads  . Family history of anesthesia complication     "some wake up during OR; some are hard to wake up; some both" (07/29/2012)  . OSA on CPAP   . Arthritis     "legs, back" (07/29/2012)  . Osteoarthritis of right knee   . Umbilical hernia     "unrepaired" (07/29/2012)  . Chronic lower back pain   . History of stomach ulcers 1980's  . Anxiety   . Depression   . GERD (gastroesophageal reflux disease)   . Hypertension   . Chronic diastolic CHF (congestive heart failure) ([place])   . Complication of anesthesia     "I have apnea" (07/29/2012)  . Dementia     per family      PSH: Past Surgical History  Procedure Laterality Date  . Iud removal  1980's  . Parathyroidectomy N/A 06/16/2012    Procedure: NECK EXPLORATION AND LEFT SUPERIOR PARATHYROIDECTOMY;  Surgeon: Earnstine Regal, MD;  Location: [facility];  Service: General;  Laterality: N/A;  . Colonoscopy    . 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 Cholecystectomy  ~ 2010  . Total knee arthroplasty Right 07/29/2012    Procedure: TOTAL KNEE ARTHROPLASTY;  Surgeon: Ninetta Lights, MD;  Location: [facility];  Service: Orthopedics;  Laterality: Right;     CC:  Chief Complaint  Patient presents with  . Transient Ischemic Attack     HPI: This is a 75 year old female patient with history of sleep apnea, hypertension, primary hyperparathyroidism, GERD,  chronic low back pain with plans for eventual surgical intervention, short-term memory loss, anxiety and depression and chronic constipation. Patient was recently admitted for TIA symptoms and was discharged on 1/19. She apparently presented with sudden onset right-sided weakness with slurred speech and unsteady gait. Mother, admission her symptoms had apparently resolved. MRI showed no acute infarct, MRA showed extensive intracranial atherosclerosis and high-grade left M1 M2 branch stenosis, there was also a new finding of systolic dysfunction with an EF of 30-35% with diffuse hypokinesis and cardiology planned to pursue outpatient ischemic evaluation. Carotid Dopplers were normal. During the hospitalization patient had an episode of paroxysmal atrial fibrillation with aberrent conduction and eliquis was started by cardiology and aspirin was discontinued. PT evaluation with recommendation for home health PT. LDL elevated at 122 with goal less than 70. She has a history of intolerance to statins and has done well with Pravachol and because  of her elevated LDL Zetia was added during the last admission.  Patient was sent back to the ER today for progressive weakness and ataxia as well as new dysarthria and right-sided facial drooping. According to the patient's daughter around 9 PM last night she noticed that her mother's speech was thick but thought she was sleepy. Physical therapy came to the patient's home this morning to begin working with the patient but patient was unable to stand with only one assist and she was noted to be quite ataxic with attempts at mobilization. Patient was unable to dress herself and physical therapist noted patient with new inability to hold objects with right hand. Patient herself noted increasing dysarthric symptoms and expressive aphasia although denied receptive aphasia. She also was told she had right facial drooping and family feels that this is improved since being brought to  the ER. Since arriving home patient has become progressively weaker and has had a significant episode of in balance where she almost fell but was able to catch her self on a chair. With 2 people assisting, patient was able to be transferred to stretcher to come to the emergency department.  ER Evaluation and treatment: Afebrile, BP 162/65, pulse 57, respirations 16 room air saturations 98%. Abnormal physical exam: Subtle right-sided weakness with decreased grip right upper extremity, right facial drooping and dysarthria MRI brain without contrast: Small acute nonhemorrhagic infarct posterior limb left internal capsule Abnormal labs: BUN 25 with previous BUN 17, creatinine 1.10; WBC 3300 with normal absolute neutrophils  Review of Systems   In addition to the HPI above,  No Fever-chills, myalgias or other constitutional symptoms No Headache, no changes with Vision or hearing, no tingling or focal weakness but significant ataxia especially with attempts to ambulate-new right facial drooping with dysarthria and some mild expressive aphasia reported by patient No problems swallowing food or Liquids including no choking or coughing with eating since discharge, indigestion/reflux No Chest pain, Cough or Shortness of Breath, palpitations, orthopnea or DOE No Abdominal pain, N/V; no melena or hematochezia, no dark tarry stools No dysuria, hematuria or flank pain No new skin rashes, lesions, masses or bruises, No new joints pains-aches No recent weight gain or loss No polyuria, polydypsia or polyphagia,  *A full 10 point Review of Systems was done, except as stated above, all other Review of Systems were negative.  Social History Social History  Substance Use Topics  . Smoking status: Former Smoker -- 1.00 packs/day for 30 years    Types: Cigarettes    Quit date: 01/07/1978  . Smokeless tobacco: Never Used  . Alcohol Use: No    Resides at: Private residence  Lives with: Alone  Ambulatory  status: At time of most recent discharge physical therapy recommended rolling walker and home health PT-since discharge patient has developed progressive weakness and new focal neurological symptoms of ataxia and now requires 2+ assist to get up and at this juncture is unable to walk due to ataxic symptoms   Family History Family History  Problem Relation Age of Onset  . Hypertension Mother     deceased  . Lung cancer Father     deceased  . Stroke Mother      Prior to Admission medications   Medication Sig Start Date End Date Taking? Authorizing Provider  apixaban (ELIQUIS) 5 MG TABS tablet Take 1 tablet (5 mg total) by mouth every 12 (twelve) hours. 01/26/15   Ripudeep Krystal Eaton, MD  buPROPion (WELLBUTRIN XL) 300 MG 24 hr  tablet Take 300 mg by mouth daily. 06/03/14   Historical Provider, MD  carvedilol (COREG) 6.25 MG tablet Take 0.5 tablets (3.125 mg total) by mouth 2 (two) times daily with a meal. 01/26/15   Ripudeep K Rai, MD  cholecalciferol (VITAMIN D) 1000 units tablet Take 1,000 Units by mouth daily.    Historical Provider, MD  ezetimibe (ZETIA) 10 MG tablet Take 1 tablet (10 mg total) by mouth daily. 01/26/15   Ripudeep Krystal Eaton, MD  furosemide (LASIX) 20 MG tablet Take 20 mg by mouth daily. 11/07/14   Historical Provider, MD  gabapentin (NEURONTIN) 300 MG capsule Take 1 capsule (300 mg total) by mouth at bedtime. 01/26/15   Ripudeep Krystal Eaton, MD  Multiple Vitamin (MULTIVITAMIN WITH MINERALS) TABS Take 1 tablet by mouth daily.    Historical Provider, MD  pravastatin (PRAVACHOL) 20 MG tablet Take 20 mg by mouth every morning. 07/21/13   Historical Provider, MD  valsartan (DIOVAN) 320 MG tablet TAKE 1 TABLET BY MOUTH EVERY DAY 10/21/14   Sueanne Margarita, MD  vitamin B-12 (CYANOCOBALAMIN) 100 MCG tablet Take 100 mcg by mouth daily.    Historical Provider, MD    Allergies  Allergen Reactions  . Lipitor [Atorvastatin] Swelling  . Simvastatin Other (See Comments)    Myalgias     . Ace  Inhibitors Cough    Physical Exam  Vitals  Blood pressure 141/82, pulse 57, temperature 97.8 F (36.6 C), temperature source Oral, resp. rate 21, height 5\' 2"  (1.575 m), weight 203 lb (92.08 kg), SpO2 96 %.   General:  In no acute distress, appears dated H, healthy and well nourished  Psych:  Normal affect, Denies Suicidal or Homicidal ideations, Awake Alert, Oriented X 3 but noted wish short-term memory deficits.   Neuro: CN II through XII intact for noted right-sided facial drooping with dysarthria and self-report of expressive aphasia, Strength: Right upper extremity weakness 4/5, right lower extremity weakness 4/5 with associated abnormal heel-to-shin test, no dysmetria Sensation intact all 4 extremities.  ENT:  Ears and Eyes appear Normal, Conjunctivae clear, PER. Dry oral mucosa without erythema or exudates.  Neck:  Supple, No lymphadenopathy appreciated  Respiratory:  Symmetrical chest wall movement, Good air movement bilaterally, CTAB. Room Air  Cardiac:  RRR, No Murmurs, no LE edema noted, no JVD, No carotid bruits, peripheral pulses palpable at 2+  Abdomen:  Positive bowel sounds, Soft, Non tender, Non distended,  No masses appreciated, no obvious hepatosplenomegaly  Skin:  No Cyanosis, Normal Skin Turgor, No Skin Rash or Bruise.  Extremities: Symmetrical without obvious trauma or injury,  no effusions.  Data Review  CBC  Recent Labs Lab 01/24/15 1337 01/24/15 1343 01/28/15 1222 01/28/15 1231  WBC 3.9*  --  3.3*  --   HGB 13.5 13.9 13.2 14.3  HCT 39.3 41.0 39.1 42.0  PLT 217  --  218  --   MCV 92.7  --  94.4  --   MCH 31.8  --  31.9  --   MCHC 34.4  --  33.8  --   RDW 12.9  --  13.1  --   LYMPHSABS 1.2  --  1.1  --   MONOABS 0.3  --  0.4  --   EOSABS 0.1  --  0.1  --   BASOSABS 0.0  --  0.0  --     Chemistries   Recent Labs Lab 01/24/15 1337 01/24/15 1343 01/28/15 1222 01/28/15 1231  NA 144 143 142 143  K 3.8 3.7 4.5 4.5  CL 107 106 107 105    CO2 25  --  25  --   GLUCOSE 92 91 92 92  BUN 17 19 17  25*  CREATININE 1.05* 1.00 1.12* 1.10*  CALCIUM 10.6*  --  10.1  --   AST 26  --  29  --   ALT 19  --  20  --   ALKPHOS 70  --  62  --   BILITOT 0.5  --  1.2  --     estimated creatinine clearance is 45.2 mL/min (by C-G formula based on Cr of 1.1).  No results for input(s): TSH, T4TOTAL, T3FREE, THYROIDAB in the last 72 hours.  Invalid input(s): FREET3  Coagulation profile  Recent Labs Lab 01/24/15 1337 01/28/15 1222  INR 1.05 1.23    No results for input(s): DDIMER in the last 72 hours.  Cardiac Enzymes No results for input(s): CKMB, TROPONINI, MYOGLOBIN in the last 168 hours.  Invalid input(s): CK  Invalid input(s): POCBNP  Urinalysis    Component Value Date/Time   COLORURINE YELLOW 01/28/2015 [place] 01/28/2015 1223   LABSPEC 1.012 01/28/2015 1223   PHURINE 5.0 01/28/2015 1223   GLUCOSEU NEGATIVE 01/28/2015 1223   HGBUR NEGATIVE 01/28/2015 1223   BILIRUBINUR NEGATIVE 01/28/2015 1223   KETONESUR NEGATIVE 01/28/2015 1223   PROTEINUR NEGATIVE 01/28/2015 1223   UROBILINOGEN 0.2 03/05/2013 1259   NITRITE NEGATIVE 01/28/2015 1223   LEUKOCYTESUR TRACE* 01/28/2015 1223    Imaging results:   Dg Chest 2 View  01/25/2015  CLINICAL DATA:  Generalized weakness. Recent transient ischemic attack EXAM: CHEST  2 VIEW COMPARISON:  Chest radiograph March 05, 2013; chest CT March 05, 2013 FINDINGS: Mild generalized interstitial prominence is stable. There is no frank edema or consolidation. There is slight cardiomegaly with slight pulmonary venous hypertension. No adenopathy apparent. There is degenerative change in the thoracic spine. IMPRESSION: Findings consistent with a degree of pulmonary vascular congestion without frank edema or consolidation. No change in cardiac silhouette. Electronically Signed   By: Lowella Grip III M.D.   On: 01/25/2015 07:58   Ct Head Wo Contrast  01/25/2015   CLINICAL DATA:  TIA status post tPA. EXAM: CT HEAD WITHOUT CONTRAST TECHNIQUE: Contiguous axial images were obtained from the base of the skull through the vertex without intravenous contrast. COMPARISON:  01/24/2015 head CT. FINDINGS: No evidence of parenchymal hemorrhage or extra-axial fluid collection. No mass lesion, mass effect, or midline shift. No CT evidence of acute infarction. Mild intracranial atherosclerosis. Nonspecific stable subcortical and periventricular white matter hypodensity, most in keeping with chronic small vessel ischemic change. Mild diffuse cerebral volume loss. No ventriculomegaly. The visualized paranasal sinuses are essentially clear. The mastoid air cells are unopacified. No evidence of calvarial fracture. IMPRESSION: 1.  No evidence of acute intracranial abnormality. 2. Mild cerebral volume loss and mild chronic small vessel ischemic white matter change. Electronically Signed   By: Ilona Sorrel M.D.   On: 01/25/2015 08:01   Ct Head Wo Contrast  01/24/2015  ADDENDUM REPORT: 01/24/2015 13:55 ADDENDUM: Critical Value/emergent results were called by telephone at the time of interpretation on 01/24/2015 at 1:54 pm to Dr. Aram Beecham, neurology , who verbally acknowledged these results. Electronically Signed   By: Lowella Grip III M.D.   On: 01/24/2015 13:55  01/24/2015  CLINICAL DATA:  Acute onset right-sided weakness and facial droop EXAM: CT HEAD WITHOUT CONTRAST TECHNIQUE: Contiguous axial images were obtained from the base  of the skull through the vertex without intravenous contrast. COMPARISON:  April 24, 2011 FINDINGS: Mild diffuse atrophy is stable. There is no intracranial mass, hemorrhage, extra-axial fluid collection, or midline shift. There is mild small vessel disease in the centra semiovale bilaterally. Elsewhere gray-white compartments appear normal. No acute infarct identified. Middle cerebral artery attenuation is symmetric and normal bilaterally. Bony calvarium appears  intact. Visualized mastoid air cells are clear. There is opacification in several ethmoid air cells. No intraorbital lesions are appreciable. IMPRESSION: Stable atrophy with patchy periventricular small vessel disease. No intracranial mass, hemorrhage, or acute appearing infarct. There is opacification of several left-sided ethmoid air cells. Electronically Signed: By: Lowella Grip III M.D. On: 01/24/2015 13:52   Mr Brain Wo Contrast  01/28/2015  CLINICAL DATA:  75 year old hypertensive female with 3 day history of right-sided weakness and slurred speech. Subsequent encounter. EXAM: MRI HEAD WITHOUT CONTRAST TECHNIQUE: Multiplanar, multiecho pulse sequences of the brain and surrounding structures were obtained without intravenous contrast. COMPARISON:  01/25/2015. FINDINGS: Exam is motion degraded. 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 Mild to moderate chronic small vessel disease changes. Global atrophy. Ventricular prominence felt to be related to atrophy rather hydrocephalus. No intracranial mass lesion noted on this unenhanced exam. Major intracranial vascular structures are patent. Please see recent MR angiogram report. Ectatic right carotid terminus. 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 Transverse ligament hypertrophy. Exophthalmos. IMPRESSION: Exam is motion degraded. 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 Mild to moderate chronic small vessel disease changes. Global atrophy. Ventricular prominence felt to be related to atrophy rather hydrocephalus. No intracranial mass lesion noted on this unenhanced exam. Major intracranial vascular structures are patent. Please see recent MR angiogram report. Ectatic right carotid terminus. 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 Exophthalmos. Electronically Signed   By: Genia Del M.D.   On: 01/28/2015 13:47   Mr Brain Wo Contrast  01/25/2015  CLINICAL DATA:   Right-sided weakness and slurred speech at primary care office today. Symptoms nearly resolved at time of presentation. EXAM: MRI HEAD WITHOUT CONTRAST MRA HEAD WITHOUT CONTRAST TECHNIQUE: Multiplanar, multiecho pulse sequences of the brain and surrounding structures were obtained without intravenous contrast. Angiographic images of the head were obtained using MRA technique without contrast. COMPARISON:  None. FINDINGS: MRI HEAD FINDINGS Calvarium and upper cervical spine: Cervical spine degeneration without visible cord compression. No focal marrow signal abnormality. Orbits: Negative. Sinuses and Mastoids: Clear. Brain: Diffusion signal in the left cortical spinal tract is roughly symmetric, especially on coronal DWI acquisition. There is no convincing acute infarct. No hemorrhage, hydrocephalus, or major vessel occlusion. No evidence of mass lesion. Generalized cerebral volume loss which is age congruent, as is small-vessel ischemic change in the periventricular white matter. 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 Fairly symmetric vertebral arteries with dominant right AICA and left PICA. Isolated left MCA show superimposed high-grade narrowing with flow gap in the inferior and superior division branches at the M1-M2 junction. Given clustered appearance and proximity to imaging slab artifact is considered, but there is no artifact elsewhere on the scan at this level. Left MCA branches have an attenuated appearance compared to the right from slow or underfilling. There is also multifocal narrowing of bilateral medium-sized arteries, worse in the anterior circulation, consistent with atherosclerosis.  Some of the stenoses are high-grade. There is no dilatation/beading beyond the stenoses. In the posterior  circulation, beginning at the P2 segments there is bilateral intermittent atherosclerotic type narrowing which is most proximal and high-grade at the left P2-P3 junction. A 1-2 mm bulge medially from the anterior genu of the right carotid siphon could be atherosclerotic, infundibular, or aneurysmal. IMPRESSION: 1. Negative for acute infarct. 2. Extensive intracranial atherosclerosis. High-grade left M1 -M2 branch stenosis is the most proximal and pertinent to the reported deficits. Please see further description above. Electronically Signed   By: Monte Fantasia M.D.   On: 01/25/2015 07:14   Mr Jodene Nam Head/brain Wo Cm  01/25/2015  CLINICAL DATA:  Right-sided weakness and slurred speech at primary care office today. Symptoms nearly resolved at time of presentation. EXAM: MRI HEAD WITHOUT CONTRAST MRA HEAD WITHOUT CONTRAST TECHNIQUE: Multiplanar, multiecho pulse sequences of the brain and surrounding structures were obtained without intravenous contrast. Angiographic images of the head were obtained using MRA technique without contrast. COMPARISON:  None. FINDINGS: MRI HEAD FINDINGS Calvarium and upper cervical spine: Cervical spine degeneration without visible cord compression. No focal marrow signal abnormality. Orbits: Negative. Sinuses and Mastoids: Clear. Brain: Diffusion signal in the left cortical spinal tract is roughly symmetric, especially on coronal DWI acquisition. There is no convincing acute infarct. No hemorrhage, hydrocephalus, or major vessel occlusion. No evidence of mass lesion. Generalized cerebral volume loss which is age congruent, as is small-vessel ischemic change in the periventricular white matter. 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 Fairly symmetric vertebral arteries with dominant right  AICA and left PICA. Isolated left MCA show superimposed high-grade narrowing with flow gap in the inferior and superior division branches at the M1-M2 junction. Given clustered appearance and proximity to imaging slab artifact is considered, but there is no artifact elsewhere on the scan at this level. Left MCA branches have an attenuated appearance compared to the right from slow or underfilling. There is also multifocal narrowing of bilateral medium-sized arteries, worse in the anterior circulation, consistent with atherosclerosis. Some of the stenoses are high-grade. There is no dilatation/beading beyond the stenoses. In the posterior circulation, beginning at the P2 segments there is bilateral intermittent atherosclerotic type narrowing which is most proximal and high-grade at the left P2-P3 junction. A 1-2 mm bulge medially from the anterior genu of the right carotid siphon could be atherosclerotic, infundibular, or aneurysmal. IMPRESSION: 1. Negative for acute infarct. 2. Extensive intracranial atherosclerosis. High-grade left M1 -M2 branch stenosis is the most proximal and pertinent to the reported deficits. Please see further description above. Electronically Signed   By: Monte Fantasia M.D.   On: 01/25/2015 07:14     Assessment & Plan  Principal Problem:   Acute ischemic stroke (Left internal capsule) -Neurology suspect subcortical infarct in the context of small vessel disease and thus Eliquis not efficacious for secondary stroke prevention but there is a concern of significant bleeding risk if antiplatelet agent added to her Eliquis -Inpt/ Tele -PT/OT/SLP evaluation: Now with significant ataxia and gait disturbance so anticipate will likely require rehabilitative therapies prior to return to home noting patient lives alone and family unable to move in with patient and assist for 24 hour care -Underwent carotid duplex, MRA and echocardiogram last admission -Holding on antiplatelet agent as  above -Hemoglobin A1c  and lipid panel checked last admission  Active Problems:   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  -Currently compensated -Started on carvedilol last admission -She found to have diffuse hypokinesis with new decrease in ejection fraction from 50 to 55% to 30-35% therefore ischemic etiology and differential; plans were to pursue outpatient stress test-given acute stroke and she will likely need a 4-6 week recovery period before pursuing ischemic evaluation -No aspirin for now as above -Continue carvedilol, statin and Zetia -Currently asymptomatic regarding CAD symptoms    Volume depletion -BUN elevated and patient with poor intake since discharge -Hold Lasix -1 time dose not saline to 50 and follow labs    OSA  -Not on CPAP    Hypertension -cont preadmission medications except for Lasix as above    Paroxysmal atrial fibrillation  -Currently rate controlled maintaining sinus rhythm -Anticoagulation with eliquis started last admission -CHADVASc = 7    Hyperparathyroidism, primary  -Most recent calcium level 10 -Continue vitamin D    GERD -Not on PPI or H2 blocker    Chronic low back pain -Continue Neurontin    Short-term memory loss -Patient and family suspect has evolving dementia    DVT Prophylaxis: Eliquis  Family Communication:   Family members at bedside  Code Status:  Full code  Condition:  Stable  Discharge disposition: Anticipate patient will need to discharge to rehabilitation or skilled nursing facility setting of acute stroke and current severe ataxia and dysmotility. Family have reported that they are unable to stay with patient 24/7 and this may become an issue at discharge if patient does not meet requirements for skilled care upon readiness for discharge  Time spent in minutes : 60      ELLIS,ALLISON L. ANP on 01/28/2015 at 3:00 PM  You may contact me by going to [email or web address] - password  [email or web address]  I am available from 7a-7p but please confirm I am on the schedule by going to Amion as above.   After 7p please contact night coverage person covering me after hours  [facility]

## 2015-01-28 NOTE — ED Provider Notes (Signed)
CSN: [id number]     Arrival date & time 01/28/15  1040 History   First MD Initiated Contact with Patient 01/28/15 1046     Chief Complaint  Patient presents with  . Transient Ischemic Attack   Chief complaint difficulty walking and difficulty speaking. Please note that patient's family member asked if she could record the patient encounter upon my arrival to the room. I declined being recorded  (Consider location/radiation/quality/duration/timing/severity/associated sxs/prior Treatment) HPI Patient was evaluated here 3 days ago when she presented with difficulty speaking. She was diagnosed with TIA. Daughter also noticed the patient was drooling earlier today. However patient reports that she's been eating well. Her daughter reports that she has had progressively worsening speech for the past 2 days. Physical therapist came to patient's house today patient was unable to walk with a walker. She also reports that she's had difficulty grasping objects with her right hand the past 2-3 days However she is able to stand. Prior to her hospital stay on 01/24/2015 patient could walk with a cane. Patient denies pain anywhere. No other associated symptoms. Brought by EMS. No treatment prior to coming here Past Medical History  Diagnosis Date  . Hyperlipidemia   . Pedal edema   . Spinal stenosis   . Varicose veins     "BLE" (07/29/2012)  . Primary hyperparathyroidism ([place])   . Thyroid disease   . Incontinent of urine     wears pads  . Family history of anesthesia complication     "some wake up during OR; some are hard to wake up; some both" (07/29/2012)  . OSA on CPAP   . Arthritis     "legs, back" (07/29/2012)  . Osteoarthritis of right knee   . Umbilical hernia     "unrepaired" (07/29/2012)  . Chronic lower back pain   . History of stomach ulcers 1980's  . Anxiety   . Depression   . GERD (gastroesophageal reflux disease)   . Hypertension   . Chronic diastolic CHF (congestive heart failure)  ([place])   . Complication of anesthesia     "I have apnea" (07/29/2012)  . Dementia     per family   Past Surgical History  Procedure Laterality Date  . Iud removal  1980's  . Parathyroidectomy N/A 06/16/2012    Procedure: NECK EXPLORATION AND LEFT SUPERIOR PARATHYROIDECTOMY;  Surgeon: Earnstine Regal, MD;  Location: [facility];  Service: General;  Laterality: N/A;  . Colonoscopy    . 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 Cholecystectomy  ~ 2010  . Total knee arthroplasty Right 07/29/2012    Procedure: TOTAL KNEE ARTHROPLASTY;  Surgeon: Ninetta Lights, MD;  Location: [facility];  Service: Orthopedics;  Laterality: Right;   Family History  Problem Relation Age of Onset  . Hypertension Mother     deceased  . Lung cancer Father     deceased  . Stroke Mother    Social History  Substance Use Topics  . Smoking status: Former Smoker -- 1.00 packs/day for 30 years    Types: Cigarettes    Quit date: 01/07/1978  . Smokeless tobacco: Never Used  . Alcohol Use: No   OB History    No data available     Review of Systems  Constitutional: Negative.   HENT: Negative.   Respiratory: Negative.   Cardiovascular: Negative.   Gastrointestinal: Negative.   Musculoskeletal: Positive for gait problem.  Skin: Negative.   Neurological: Positive for speech difficulty.  Psychiatric/Behavioral: Negative.  Allergies  Lipitor; Simvastatin; and Ace inhibitors  Home Medications   Prior to Admission medications   Medication Sig Start Date End Date Taking? Authorizing Provider  apixaban (ELIQUIS) 5 MG TABS tablet Take 1 tablet (5 mg total) by mouth every 12 (twelve) hours. 01/26/15   Ripudeep Krystal Eaton, MD  buPROPion (WELLBUTRIN XL) 300 MG 24 hr tablet Take 300 mg by mouth daily. 06/03/14   Historical Provider, MD  carvedilol (COREG) 6.25 MG tablet Take 0.5 tablets (3.125 mg total) by mouth 2 (two) times daily with a meal. 01/26/15   Ripudeep K Rai, MD  cholecalciferol (VITAMIN D) 1000 units tablet Take  1,000 Units by mouth daily.    Historical Provider, MD  ezetimibe (ZETIA) 10 MG tablet Take 1 tablet (10 mg total) by mouth daily. 01/26/15   Ripudeep Krystal Eaton, MD  furosemide (LASIX) 20 MG tablet Take 20 mg by mouth daily. 11/07/14   Historical Provider, MD  gabapentin (NEURONTIN) 300 MG capsule Take 1 capsule (300 mg total) by mouth at bedtime. 01/26/15   Ripudeep Krystal Eaton, MD  Multiple Vitamin (MULTIVITAMIN WITH MINERALS) TABS Take 1 tablet by mouth daily.    Historical Provider, MD  pravastatin (PRAVACHOL) 20 MG tablet Take 20 mg by mouth every morning. 07/21/13   Historical Provider, MD  valsartan (DIOVAN) 320 MG tablet TAKE 1 TABLET BY MOUTH EVERY DAY 10/21/14   Sueanne Margarita, MD  vitamin B-12 (CYANOCOBALAMIN) 100 MCG tablet Take 100 mcg by mouth daily.    Historical Provider, MD   BP 152/82 mmHg  Pulse 60  Temp(Src) 97.8 F (36.6 C) (Oral)  Resp 16  Ht 5\' 2"  (1.575 m)  Wt 203 lb (92.08 kg)  BMI 37.12 kg/m2  SpO2 99% Physical Exam  Constitutional: She appears well-developed and well-nourished. No distress.  His secretions well no distress  HENT:  Head: Normocephalic and atraumatic.  Eyes: Conjunctivae are normal. Pupils are equal, round, and reactive to light.  Neck: Neck supple. No tracheal deviation present. No thyromegaly present.  Cardiovascular: Normal rate and regular rhythm.   No murmur heard. Pulmonary/Chest: Effort normal and breath sounds normal.  Abdominal: Soft. Bowel sounds are normal. She exhibits no distension. There is no tenderness.  Obese  Musculoskeletal: Normal range of motion. She exhibits no edema or tenderness.  Neurological: She is alert. Coordination normal.  Skin: Skin is warm and dry. No rash noted.  Psychiatric: She has a normal mood and affect.  Nursing note and vitals reviewed.   ED Course  Procedures (including critical care time) Labs Review Labs Reviewed  ETHANOL  PROTIME-INR  APTT  CBC  DIFFERENTIAL  COMPREHENSIVE METABOLIC PANEL  URINE  RAPID DRUG SCREEN, HOSP PERFORMED  URINALYSIS, ROUTINE W REFLEX MICROSCOPIC (NOT AT [facility])  I-STAT CHEM 8, ED  I-STAT TROPOININ, ED    Imaging Review No results found. I have personally reviewed and evaluated these images and lab results as part of my medical decision-making.   EKG Interpretation None     ED ECG REPORT   Date: 01/28/2015  Rate: 60  Rhythm: normal sinus rhythm  QRS Axis: left  Intervals: normal  ST/T Wave abnormalities: nonspecific T wave changes  Conduction Disutrbances:nonspecific intraventricular conduction delay  Narrative Interpretation:   Old EKG Reviewed: Nonspecific lateral T waves new over tracing from 01/24/2015  I have personally reviewed the EKG tracing and agree with the computerized printout as noted.  Pt passed swallow screen Results for orders placed or performed during the hospital encounter of  01/28/15  Ethanol  Result Value Ref Range   Alcohol, Ethyl (B) <5 <5 mg/dL  Protime-INR  Result Value Ref Range   Prothrombin Time 15.6 (H) 11.6 - 15.2 seconds   INR 1.23 0.00 - 1.49  APTT  Result Value Ref Range   aPTT 32 24 - 37 seconds  CBC  Result Value Ref Range   WBC 3.3 (L) 4.0 - 10.5 K/uL   RBC 4.14 3.87 - 5.11 MIL/uL   Hemoglobin 13.2 12.0 - 15.0 g/dL   HCT 39.1 36.0 - 46.0 %   MCV 94.4 78.0 - 100.0 fL   MCH 31.9 26.0 - 34.0 pg   MCHC 33.8 30.0 - 36.0 g/dL   RDW 13.1 11.5 - 15.5 %   Platelets 218 150 - 400 K/uL  Differential  Result Value Ref Range   Neutrophils Relative % 51 %   Neutro Abs 1.7 1.7 - 7.7 K/uL   Lymphocytes Relative 34 %   Lymphs Abs 1.1 0.7 - 4.0 K/uL   Monocytes Relative 11 %   Monocytes Absolute 0.4 0.1 - 1.0 K/uL   Eosinophils Relative 3 %   Eosinophils Absolute 0.1 0.0 - 0.7 K/uL   Basophils Relative 1 %   Basophils Absolute 0.0 0.0 - 0.1 K/uL  Comprehensive metabolic panel  Result Value Ref Range   Sodium 142 135 - 145 mmol/L   Potassium 4.5 3.5 - 5.1 mmol/L   Chloride 107 101 - 111 mmol/L   CO2 25  22 - 32 mmol/L   Glucose, Bld 92 65 - 99 mg/dL   BUN 17 6 - 20 mg/dL   Creatinine, Ser 1.12 (H) 0.44 - 1.00 mg/dL   Calcium 10.1 8.9 - 10.3 mg/dL   Total Protein 6.7 6.5 - 8.1 g/dL   Albumin 3.9 3.5 - 5.0 g/dL   AST 29 15 - 41 U/L   ALT 20 14 - 54 U/L   Alkaline Phosphatase 62 38 - 126 U/L   Total Bilirubin 1.2 0.3 - 1.2 mg/dL   GFR calc non Af Amer 46 (L) >60 mL/min   GFR calc Af Amer 53 (L) >60 mL/min   Anion gap 10 5 - 15  Urine rapid drug screen (hosp performed)not at [facility]  Result Value Ref Range   Opiates NONE DETECTED NONE DETECTED   Cocaine NONE DETECTED NONE DETECTED   Benzodiazepines NONE DETECTED NONE DETECTED   Amphetamines NONE DETECTED NONE DETECTED   Tetrahydrocannabinol NONE DETECTED NONE DETECTED   Barbiturates NONE DETECTED NONE DETECTED  Urinalysis, Routine w reflex microscopic (not at [facility])  Result Value Ref Range   Color, Urine YELLOW YELLOW   APPearance CLEAR CLEAR   Specific Gravity, Urine 1.012 1.005 - 1.030   pH 5.0 5.0 - 8.0   Glucose, UA NEGATIVE NEGATIVE mg/dL   Hgb urine dipstick NEGATIVE NEGATIVE   Bilirubin Urine NEGATIVE NEGATIVE   Ketones, ur NEGATIVE NEGATIVE mg/dL   Protein, ur NEGATIVE NEGATIVE mg/dL   Nitrite NEGATIVE NEGATIVE   Leukocytes, UA TRACE (A) NEGATIVE  Urine microscopic-add on  Result Value Ref Range   Squamous Epithelial / LPF 0-5 (A) NONE SEEN   WBC, UA 0-5 0 - 5 WBC/hpf   RBC / HPF NONE SEEN 0 - 5 RBC/hpf   Bacteria, UA NONE SEEN NONE SEEN  I-Stat Chem 8, ED  (not at [facility], [facility])  Result Value Ref Range   Sodium 143 135 - 145 mmol/L   Potassium 4.5 3.5 - 5.1 mmol/L   Chloride 105  101 - 111 mmol/L   BUN 25 (H) 6 - 20 mg/dL   Creatinine, Ser 1.10 (H) 0.44 - 1.00 mg/dL   Glucose, Bld 92 65 - 99 mg/dL   Calcium, Ion 1.31 (H) 1.13 - 1.30 mmol/L   TCO2 26 0 - 100 mmol/L   Hemoglobin 14.3 12.0 - 15.0 g/dL   HCT 42.0 36.0 - 46.0 %  I-stat troponin, ED (not at [facility], [facility])  Result Value Ref Range   Troponin i, poc 0.02 0.00 -  0.08 ng/mL   Comment 3           Dg Chest 2 View  01/25/2015  CLINICAL DATA:  Generalized weakness. Recent transient ischemic attack EXAM: CHEST  2 VIEW COMPARISON:  Chest radiograph March 05, 2013; chest CT March 05, 2013 FINDINGS: Mild generalized interstitial prominence is stable. There is no frank edema or consolidation. There is slight cardiomegaly with slight pulmonary venous hypertension. No adenopathy apparent. There is degenerative change in the thoracic spine. IMPRESSION: Findings consistent with a degree of pulmonary vascular congestion without frank edema or consolidation. No change in cardiac silhouette. Electronically Signed   By: Lowella Grip III M.D.   On: 01/25/2015 07:58   Ct Head Wo Contrast  01/25/2015  CLINICAL DATA:  TIA status post tPA. EXAM: CT HEAD WITHOUT CONTRAST TECHNIQUE: Contiguous axial images were obtained from the base of the skull through the vertex without intravenous contrast. COMPARISON:  01/24/2015 head CT. FINDINGS: No evidence of parenchymal hemorrhage or extra-axial fluid collection. No mass lesion, mass effect, or midline shift. No CT evidence of acute infarction. Mild intracranial atherosclerosis. Nonspecific stable subcortical and periventricular white matter hypodensity, most in keeping with chronic small vessel ischemic change. Mild diffuse cerebral volume loss. No ventriculomegaly. The visualized paranasal sinuses are essentially clear. The mastoid air cells are unopacified. No evidence of calvarial fracture. IMPRESSION: 1.  No evidence of acute intracranial abnormality. 2. Mild cerebral volume loss and mild chronic small vessel ischemic white matter change. Electronically Signed   By: Ilona Sorrel M.D.   On: 01/25/2015 08:01   Ct Head Wo Contrast  01/24/2015  ADDENDUM REPORT: 01/24/2015 13:55 ADDENDUM: Critical Value/emergent results were called by telephone at the time of interpretation on 01/24/2015 at 1:54 pm to Dr. Aram Beecham, neurology , who  verbally acknowledged these results. Electronically Signed   By: Lowella Grip III M.D.   On: 01/24/2015 13:55  01/24/2015  CLINICAL DATA:  Acute onset right-sided weakness and facial droop EXAM: CT HEAD WITHOUT CONTRAST TECHNIQUE: Contiguous axial images were obtained from the base of the skull through the vertex without intravenous contrast. COMPARISON:  April 24, 2011 FINDINGS: Mild diffuse atrophy is stable. There is no intracranial mass, hemorrhage, extra-axial fluid collection, or midline shift. There is mild small vessel disease in the centra semiovale bilaterally. Elsewhere gray-white compartments appear normal. No acute infarct identified. Middle cerebral artery attenuation is symmetric and normal bilaterally. Bony calvarium appears intact. Visualized mastoid air cells are clear. There is opacification in several ethmoid air cells. No intraorbital lesions are appreciable. IMPRESSION: Stable atrophy with patchy periventricular small vessel disease. No intracranial mass, hemorrhage, or acute appearing infarct. There is opacification of several left-sided ethmoid air cells. Electronically Signed: By: Lowella Grip III M.D. On: 01/24/2015 13:52   Mr Brain Wo Contrast  01/28/2015  CLINICAL DATA:  75 year old hypertensive female with 3 day history of right-sided weakness and slurred speech. Subsequent encounter. EXAM: MRI HEAD WITHOUT CONTRAST TECHNIQUE: Multiplanar, multiecho pulse sequences of the  brain and surrounding structures were obtained without intravenous contrast. COMPARISON:  01/25/2015. FINDINGS: Exam is motion degraded. 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 Mild to moderate chronic small vessel disease changes. Global atrophy. Ventricular prominence felt to be related to atrophy rather hydrocephalus. No intracranial mass lesion noted on this unenhanced exam. Major intracranial vascular structures are patent. Please see recent MR angiogram report.  Ectatic right carotid terminus. 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 Transverse ligament hypertrophy. Exophthalmos. IMPRESSION: Exam is motion degraded. 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 Mild to moderate chronic small vessel disease changes. Global atrophy. Ventricular prominence felt to be related to atrophy rather hydrocephalus. No intracranial mass lesion noted on this unenhanced exam. Major intracranial vascular structures are patent. Please see recent MR angiogram report. Ectatic right carotid terminus. 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 Exophthalmos. Electronically Signed   By: Genia Del M.D.   On: 01/28/2015 13:47   Mr Brain Wo Contrast  01/25/2015  CLINICAL DATA:  Right-sided weakness and slurred speech at primary care office today. Symptoms nearly resolved at time of presentation. EXAM: MRI HEAD WITHOUT CONTRAST MRA HEAD WITHOUT CONTRAST TECHNIQUE: Multiplanar, multiecho pulse sequences of the brain and surrounding structures were obtained without intravenous contrast. Angiographic images of the head were obtained using MRA technique without contrast. COMPARISON:  None. FINDINGS: MRI HEAD FINDINGS Calvarium and upper cervical spine: Cervical spine degeneration without visible cord compression. No focal marrow signal abnormality. Orbits: Negative. Sinuses and Mastoids: Clear. Brain: Diffusion signal in the left cortical spinal tract is roughly symmetric, especially on coronal DWI acquisition. There is no convincing acute infarct. No hemorrhage, hydrocephalus, or major vessel occlusion. No evidence of mass lesion. Generalized cerebral volume loss which is age congruent, as is small-vessel ischemic change in the periventricular white matter. 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 Fairly symmetric vertebral arteries with dominant right AICA and left PICA. Isolated left MCA show superimposed high-grade narrowing with flow gap in the inferior and superior division branches at the M1-M2 junction. Given clustered appearance and proximity to imaging slab artifact is considered, but there is no artifact elsewhere on the scan at this level. Left MCA branches have an attenuated appearance compared to the right from slow or underfilling. There is also multifocal narrowing of bilateral medium-sized arteries, worse in the anterior circulation, consistent with atherosclerosis. Some of the stenoses are high-grade. There is no dilatation/beading beyond the stenoses. In the posterior circulation, beginning at the P2 segments there is bilateral intermittent atherosclerotic type narrowing which is most proximal and high-grade at the left P2-P3 junction. A 1-2 mm bulge medially from the anterior genu of the right carotid siphon could be atherosclerotic, infundibular, or aneurysmal. IMPRESSION: 1. Negative for acute infarct. 2. Extensive intracranial atherosclerosis. High-grade left M1 -M2 branch stenosis is the most proximal and pertinent to the reported deficits. Please see further description above. Electronically Signed   By: Monte Fantasia M.D.   On: 01/25/2015 07:14   Mr Jodene Nam Head/brain Wo Cm  01/25/2015  CLINICAL DATA:  Right-sided weakness and slurred speech at primary care office today. Symptoms nearly resolved at time of presentation. EXAM: MRI HEAD WITHOUT CONTRAST MRA HEAD WITHOUT CONTRAST TECHNIQUE: Multiplanar, multiecho pulse sequences of the brain and surrounding structures were obtained without intravenous contrast.  Angiographic images of the head were obtained using MRA technique without contrast. COMPARISON:  None. FINDINGS: MRI HEAD FINDINGS Calvarium and upper cervical spine: Cervical spine degeneration without visible cord compression. No  focal marrow signal abnormality. Orbits: Negative. Sinuses and Mastoids: Clear. Brain: Diffusion signal in the left cortical spinal tract is roughly symmetric, especially on coronal DWI acquisition. There is no convincing acute infarct. No hemorrhage, hydrocephalus, or major vessel occlusion. No evidence of mass lesion. Generalized cerebral volume loss which is age congruent, as is small-vessel ischemic change in the periventricular white matter. 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 Fairly symmetric vertebral arteries with dominant right AICA and left PICA. Isolated left MCA show superimposed high-grade narrowing with flow gap in the inferior and superior division branches at the M1-M2 junction. Given clustered appearance and proximity to imaging slab artifact is considered, but there is no artifact elsewhere on the scan at this level. Left MCA branches have an attenuated appearance compared to the right from slow or underfilling. There is also multifocal narrowing of bilateral medium-sized arteries, worse in the anterior circulation, consistent with atherosclerosis. Some of the stenoses are high-grade. There is no dilatation/beading beyond the stenoses. In the posterior circulation, beginning at the P2 segments there is bilateral intermittent atherosclerotic type narrowing which is most proximal and high-grade at the left P2-P3 junction. A 1-2 mm bulge medially from the anterior genu of the right carotid siphon could be atherosclerotic, infundibular, or aneurysmal. IMPRESSION: 1. Negative for acute infarct. 2. Extensive intracranial atherosclerosis. High-grade left M1 -M2 branch stenosis is the most proximal and pertinent to the reported deficits. Please see further description above. Electronically Signed   By:  Monte Fantasia M.D.   On: 01/25/2015 07:14    MDM  Dr. Aram Beecham from neurology service was consulted by me and evaluated the patient in the emergency department. He is requesting admission to hospitalist service. Dr. Posey Pronto to admit to medical surgical floor Final diagnoses:  None  Dx acute ischemic stroke      Orlie Dakin, MD 01/28/15 1430

## 2015-01-28 NOTE — Progress Notes (Signed)
Patient arrived to 5M05 AAOx4. Family is at the bedside. Vitals taken, call bell at her side and questions answered. Will continue to monitor. Jamai Dolce, Rande Brunt, RN

## 2015-01-29 ENCOUNTER — Inpatient Hospital Stay (HOSPITAL_COMMUNITY): Payer: PPO

## 2015-01-29 ENCOUNTER — Inpatient Hospital Stay (HOSPITAL_COMMUNITY): Admit: 2015-01-29 | Payer: Self-pay

## 2015-01-29 DIAGNOSIS — I639 Cerebral infarction, unspecified: Secondary | ICD-10-CM | POA: Diagnosis not present

## 2015-01-29 DIAGNOSIS — M79609 Pain in unspecified limb: Secondary | ICD-10-CM | POA: Diagnosis not present

## 2015-01-29 DIAGNOSIS — R600 Localized edema: Secondary | ICD-10-CM

## 2015-01-29 LAB — COMPREHENSIVE METABOLIC PANEL
ALBUMIN: 3.7 g/dL (ref 3.5–5.0)
ALK PHOS: 67 U/L (ref 38–126)
ALT: 20 U/L (ref 14–54)
ANION GAP: 8 (ref 5–15)
AST: 25 U/L (ref 15–41)
BILIRUBIN TOTAL: 0.8 mg/dL (ref 0.3–1.2)
BUN: 17 mg/dL (ref 6–20)
CO2: 27 mmol/L (ref 22–32)
Calcium: 10.3 mg/dL (ref 8.9–10.3)
Chloride: 107 mmol/L (ref 101–111)
Creatinine, Ser: 1.06 mg/dL — ABNORMAL HIGH (ref 0.44–1.00)
GFR, EST AFRICAN AMERICAN: 57 mL/min — AB (ref >60)
GFR, EST NON AFRICAN AMERICAN: 49 mL/min — AB (ref >60)
GLUCOSE: 128 mg/dL — AB (ref 65–99)
POTASSIUM: 3.9 mmol/L (ref 3.5–5.1)
Sodium: 142 mmol/L (ref 135–145)
TOTAL PROTEIN: 7 g/dL (ref 6.5–8.1)

## 2015-01-29 LAB — CBC WITH DIFFERENTIAL/PLATELET
BASOS PCT: 1 %
Basophils Absolute: 0.1 10*3/uL (ref 0.0–0.1)
Eosinophils Absolute: 0.2 10*3/uL (ref 0.0–0.7)
Eosinophils Relative: 4 %
HEMATOCRIT: 40.4 % (ref 36.0–46.0)
HEMOGLOBIN: 13.4 g/dL (ref 12.0–15.0)
LYMPHS ABS: 1.1 10*3/uL (ref 0.7–4.0)
Lymphocytes Relative: 27 %
MCH: 31.5 pg (ref 26.0–34.0)
MCHC: 33.2 g/dL (ref 30.0–36.0)
MCV: 95.1 fL (ref 78.0–100.0)
MONO ABS: 0.4 10*3/uL (ref 0.1–1.0)
MONOS PCT: 10 %
NEUTROS ABS: 2.3 10*3/uL (ref 1.7–7.7)
Neutrophils Relative %: 58 %
Platelets: 244 10*3/uL (ref 150–400)
RBC: 4.25 MIL/uL (ref 3.87–5.11)
RDW: 13.1 % (ref 11.5–15.5)
WBC: 3.9 10*3/uL — ABNORMAL LOW (ref 4.0–10.5)

## 2015-01-29 LAB — PROTIME-INR
INR: 1.28 (ref 0.00–1.49)
PROTHROMBIN TIME: 16.1 s — AB (ref 11.6–15.2)

## 2015-01-29 MED ORDER — DOCUSATE SODIUM 100 MG PO CAPS
100.0000 mg | ORAL_CAPSULE | Freq: Every day | ORAL | Status: DC
  Administered 2015-01-29 – 2015-01-31 (×3): 100 mg via ORAL
  Filled 2015-01-29 (×3): qty 1

## 2015-01-29 MED ORDER — FUROSEMIDE 20 MG PO TABS
20.0000 mg | ORAL_TABLET | Freq: Every day | ORAL | Status: DC
  Administered 2015-01-29 – 2015-01-31 (×3): 20 mg via ORAL
  Filled 2015-01-29 (×3): qty 1

## 2015-01-29 NOTE — Progress Notes (Addendum)
STROKE TEAM PROGRESS NOTE   HISTORY OF PRESENT ILLNESS Susan [name] is an 75 y.o. female with a past medical history significant for HLD, HTN, chronic congestive heart failure, hypothyroidism, OSA, depression, anxiety, brought in by family due to increasing weakness and inability to walk. Of note, patient was released from [facility] on 1/19 with diagnosis of TIA (had dizziness and right sided weakness) and had comprehensive TIA work up that was significant only for TEE with EF 30-35%, severly dilated left atrium, diffuse hypokinesis, but no mural thrombus seen. She was discharged home on apixaban. Patient lives by herself and her daughter is at the bedside stating that she talked to her mother on the phone last night and her speech was not normal " like having a thick tongue". Then, this morning she noted that the speech was worse, the right face was drooping, and she couldn't use the right arm properly. Similarly, patient was so weak this morning that couldn't participate on her home PT session. Denies HA, vertigo, double vision, difficulty swallowing, or vision impairment. Endorses feeling very fatigue since yesterday. 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  Date last known well: unclear Time last known well: unclear Tpa given: no, late presentation   SUBJECTIVE (INTERVAL HISTORY) Patient's daughter is at the bedside. Confirmed that patient was discharged on the 19th on eloquis. Confirmed with patient and daughter that patient did not take any of her medications on the 20th, did not take Eliquis (patient's daughter called home and had pills verified and counted, she did not take Eliquis or any meds on  the 20th). Patient returned on the 21st with a small acute infarct in the posterior limb of the left internal capsule which is likely small vessel disease. Discussed as far as neurology is concerned, she can be discharged home  on Eliquis.    OBJECTIVE Temp:  [97.7 F (36.5 C)-99.1 F (37.3 C)] 97.9 F (36.6 C) (01/22 0559) Pulse Rate:  [57-69] 64 (01/22 0559) Cardiac Rhythm:  [-] Normal sinus rhythm;Bundle branch block (01/21 2211) Resp:  [14-21] 18 (01/22 0559) BP: (137-169)/(55-86) 153/69 mmHg (01/22 0559) SpO2:  [93 %-99 %] 96 % (01/22 0559) Weight:  [84.868 kg (187 lb 1.6 oz)-92.08 kg (203 lb)] 84.868 kg (187 lb 1.6 oz) (01/21 2156)  CBC:  Recent Labs Lab 01/24/15 1337  01/28/15 1222 01/28/15 1231  WBC 3.9*  --  3.3*  --   NEUTROABS 2.3  --  1.7  --   HGB 13.5  < > 13.2 14.3  HCT 39.3  < > 39.1 42.0  MCV 92.7  --  94.4  --   PLT 217  --  218  --   < > = values in this interval not displayed.  Basic Metabolic Panel:  Recent Labs Lab 01/24/15 1337  01/28/15 1222 01/28/15 1231  NA 144  < > 142 143  K 3.8  < > 4.5 4.5  CL 107  < > 107 105  CO2 25  --  25  --   GLUCOSE 92  < > 92 92  BUN 17  < > 17 25*  CREATININE 1.05*  < > 1.12* 1.10*  CALCIUM 10.6*  --  10.1  --   < > = values in this interval not displayed.  Lipid Panel:    Component Value Date/Time   CHOL 204* 01/25/2015 0408   TRIG 156* 01/25/2015 0408   HDL 51 01/25/2015 0408  CHOLHDL 4.0 01/25/2015 0408   VLDL 31 01/25/2015 0408   LDLCALC 122* 01/25/2015 0408   HgbA1c:  Lab Results  Component Value Date   HGBA1C 6.1* 01/25/2015   Urine Drug Screen:    Component Value Date/Time   LABOPIA NONE DETECTED 01/28/2015 1223   COCAINSCRNUR NONE DETECTED 01/28/2015 1223   LABBENZ NONE DETECTED 01/28/2015 1223   AMPHETMU NONE DETECTED 01/28/2015 1223   THCU NONE DETECTED 01/28/2015 1223   LABBARB NONE DETECTED 01/28/2015 1223      IMAGING  Mr Brain Wo Contrast 01/28/2015   Exam is motion degraded. 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 Mild to moderate chronic small vessel disease changes. Global atrophy. Ventricular prominence felt to be related to atrophy rather hydrocephalus. No  intracranial mass lesion noted on this unenhanced exam. Major intracranial vascular structures are patent. Please see recent MR angiogram report. Ectatic right carotid terminus. 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 Exophthalmos.   MRA 01/25/2015 Extensive intracranial atherosclerosis. High-grade left M1 - M2 branch stenosis is the most proximal and pertinent to the reported deficits.     PHYSICAL EXAM Pleasant elderly African-American lady currently not in distress. . Afebrile. Head is nontraumatic. Neck is supple without bruit. Cardiac exam no murmur or gallop. Lungs are clear to auscultation. Distal pulses are well felt. Neurological Exam ;  Awake Alert oriented x 3. Normal speech and language.eye movements full without nystagmus.fundi were not visualized. PERRL. Vision acuity and fields appear normal. Hearing is normal. Palatal movements are normal. Right facial droop.  Tongue midline. Mild right hemiparesis and right hemisensory loss. Tone and bulk normal. Reflexes intact, toes downgoing.        ASSESSMENT/PLAN [name] is a 75 y.o. female with history of hyperlipidemia, hypertension, congestive heart failure, cardiomyopathy EF 30-35% hypothyroidism, obstructive sleep apnea, depression, anxiety, and recent TIA -> Eliquis, presenting with right hemiparesis and dysarthria. She did not receive IV t-PA due to late presentation.  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  MRA - Extensive intracranial atherosclerosis.   Carotid Doppler - 1- 39 percent stenosis involving the right ICA.Marland Kitchen No stenosis or plaque identified on left ICA.  2D Echo - EF 30-35%.  LDL - 122  HgbA1c - 6.1  VTE prophylaxis - Eliquis  Diet Heart Room service  appropriate?: Yes; Fluid consistency:: Thin  Eliquis (apixaban) daily prior to admission,but was not compliant, missed dose when she was sent home. Suggest continuing  Eliquis (apixaban) daily  Patient counseled to be compliant with her antithrombotic medications  Ongoing aggressive stroke risk factor management  Therapy recommendations: - CIR - rehabilitation M.D. consult recommended. (Will order)  Disposition:  Pending  Hypertension  Stable  Permissive hypertension (OK if < 220/120) but gradually normalize in 5-7 days  Hyperlipidemia  Home meds: Pravachol 20 mg daily and Zetia 10 mg daily resumed in hospital  LDL 122, goal < 70  Lipitor and Zocor allergies. (Zetia started several days ago)  Continue statin at discharge    Other Stroke Risk Factors  Advanced age  Cigarette smoker, quit smoking 1980  Obesity, Body mass index is 31.14 kg/(m^2).   Hx stroke/TIA  Family hx stroke (Mother)  Obstructive sleep apnea, on CPAP at home   Other Active Problems  Renal insufficiency  Hospital day #  1  Stroke team will sign off at this time.    Personally examined patient and images, and have participated in and made any corrections needed to history, physical, neuro exam,assessment and plan as stated above.  I have personally obtained the history, evaluated lab date, reviewed imaging studies and agree with radiology interpretations.    Sarina Ill, MD Stroke Neurology [phone] [facility]          To contact Stroke Continuity provider, please refer to [email or web address] After hours, contact General Neurology

## 2015-01-29 NOTE — Evaluation (Signed)
Physical Therapy Evaluation Patient Details Name: Susan [name] MRN: [id number] DOB: 1940-08-15 Today's Date: 01/29/2015   History of Present Illness    75 y.o. female with a past medical history significant for HLD, HTN, chronic congestive heart failure, hypothyroidism, OSA, depression, anxiety, brought in by family due to dysarthria, right side weakness.  Of note, patient was released from [facility] on 1/19 with diagnosis of TIA (had dizziness and right sided weakness) and had comprehensive TIA work up that was significant only for TEE with EF 30-35%, severly dilated left atrium, diffuse hypokinesis, but no mural thrombus seen. She was discharged home on apixaban.   Clinical Impression  Pt presents with moderate limitations to functional mobility due to weakness and decr motor control affecting balance, moving and changing positions and walking around. Complicated by apparent memory deficit seen as mild confusion during conversation (unclear whether premorbid, per chart family reports "dementia").  Pt motivated to return to independent function, but highly concerned about ability to return home immediately following this episode and prefers postacute setting to seek rehab; daughter at bedside concurs.    CIR consult placed to assess for potential admission with goal of returning to home alone (does she live at ILF??); if pt does not meet admission requirements, will need alternative d/c plan for postacute rehabilitation.  It is unclear whether pt could transition from rehab to ALF before returning to [place], if that is in fact her prior PT will initiate services in acute setting and assist with d/c planning as appropriate.      Follow Up Recommendations CIR (seeking consult, will need postacute rehab)    Equipment Recommendations  None recommended by PT    Recommendations for Other Services Rehab consult     Precautions / Restrictions Precautions Precautions: Fall Precaution Comments: up with  assist/RW      Mobility  Bed Mobility Overal bed mobility: Needs Assistance Bed Mobility: Supine to Sit     Supine to sit: Min assist     General bed mobility comments: performed with HOB up this session; incr time to complete, needs min assist to pivot around with right hip to sit square at EOB  Transfers Overall transfer level: Needs assistance Equipment used: Rolling walker (2 wheeled) Transfers: Sit to/from Stand Sit to Stand: Min assist         General transfer comment: verbal cues for hand placement and problem solving best/safest technique, to reach back and control descent, to align with surface, and to manage anxiety  Ambulation/Gait Ambulation/Gait assistance: Min assist Ambulation Distance (Feet): 2 Feet (pt anxious, only took 2-3 steps foward/backward with RW) Assistive device: Rolling walker (2 wheeled) Gait Pattern/deviations: Step-to pattern Gait velocity:     General Gait Details: side stepping and forward/backward stepping with verbal cue and manual facilitation to right knee/hip for 'pregait' activities to sequence and maximize benefit or RW to limit fear of load bearing through impacted limb; pt initally anxious, but over time and with success gained confidence  Stairs            Wheelchair Mobility    Modified Rankin (Stroke Patients Only) Modified Rankin (Stroke Patients Only) Pre-Morbid Rankin Score: Slight disability Modified Rankin: Moderately severe disability     Balance Overall balance assessment: Needs assistance Sitting-balance support: No upper extremity supported;Feet supported Sitting balance-Leahy Scale: Fair     Standing balance support: During functional activity;Bilateral upper extremity supported Standing balance-Leahy Scale: Poor Standing balance comment: dependent on RW for stability  Pertinent Vitals/Pain Pain Assessment: No/denies pain    Home Living Family/patient expects  to be discharged to:: Inpatient rehab Living Arrangements: Alone Available Help at Discharge: Available PRN/intermittently;Family Type of Home: Independent living facility Home Access: Level entry     Home Layout: One level Home Equipment: Cane - quad;Shower seat;Grab bars - tub/shower;Bedside commode      Prior Function Level of Independence: Needs assistance   Gait / Transfers Assistance Needed: just d/c home after TIA, had initial HHPT visit, using RW, but had this event precipitating readmission     Comments: uses quad cane in community and no device in home. Pts family reports that she frequently furniture walks at home     Hand Dominance   Dominant Hand: Right    Extremity/Trunk Assessment   Upper Extremity Assessment: Defer to OT evaluation (generally mild weakness RUE v. L when observed)           Lower Extremity Assessment: RLE deficits/detail RLE Deficits / Details: mild weakness seen as slow response to motor command but able to fully extend knee and hold body weight with RW for stepping;       Communication   Communication: No difficulties  Cognition Arousal/Alertness: Awake/alert Behavior During Therapy: WFL for tasks assessed/performed Overall Cognitive Status: History of cognitive impairments - at baseline       Memory: Decreased short-term memory              General Comments      Exercises        Assessment/Plan    PT Assessment Patient needs continued PT services  PT Diagnosis Hemiplegia dominant side   PT Problem List Obesity;Decreased safety awareness;Decreased knowledge of use of DME;Decreased cognition;Decreased mobility;Decreased activity tolerance;Decreased strength  PT Treatment Interventions Patient/family education;Neuromuscular re-education;Balance training;Therapeutic exercise;Therapeutic activities;Functional mobility training;Gait training;DME instruction   PT Goals (Current goals can be found in the Care Plan section)  Acute Rehab PT Goals Patient Stated Goal: to be stronger and more independent before going home PT Goal Formulation: With patient/family Time For Goal Achievement: 02/12/15 Potential to Achieve Goals: Good    Frequency Min 4X/week   Barriers to discharge Decreased caregiver support lives alone with intermittent support... is she in true ILF and could she go to ALF after  postacute rehab??    Co-evaluation               End of Session Equipment Utilized During Treatment: Gait belt Activity Tolerance: Patient tolerated treatment well Patient left: in chair;with call bell/phone within reach;with family/visitor present Nurse Communication: Mobility status         Time: [id number] PT Time Calculation (min) (ACUTE ONLY): 53 min   Charges:   PT Evaluation $PT Eval Moderate Complexity: 1 Procedure PT Treatments $Gait Training: 8-22 mins $Therapeutic Activity: 8-22 mins   PT G Codes:        Herbie Drape 01/29/2015, 12:14 PM

## 2015-01-29 NOTE — Progress Notes (Signed)
[facility] Progress Note  Patient: Susan [name] [id number]   PCP: [name], MD DOB: 01-31-1940   DOA: 01/28/2015   DOS: 01/29/2015   Date of Service: the patient was seen and examined on 01/29/2015  Subjective: continues to have weakness in fine movement and facial droop, no chest pain or abdominal pain, family remains concerned about her going home alone Nutrition: able to tolerate oral diet Activity: ambulating in the room Last BM: 01/28/2015  Assessment and Plan: 1. Acute ischemic stroke [facility]) Recently presented for TIA, due to diagnosis of atrial fibrillation patient was discharged on Apixaban. Patient had worsening symptoms of right-sided weakness as well as facial droop after discharge and comes back to the hospital with positive MRI for acute left ICA infarct. Neurologically she remains stable. We will await recommendation from neurology regarding adding antiplatelet agent. Continued on as a close, Pravachol, Zetia. PTOT and speech evaluation.  2. Leg tenderness. Taking ultrasound Doppler to rule out DVT.  3. Chronic combined CHF. Ejection fraction 3035%. Suspected ischemic etiology. As per discussion with cardiology the patient will need outpatient workup for stress test once she is better from the acute CVA. Await recommendation from neurology regarding admission off aspirin. Patient was already taking aspirin at home for one dose.  4. Peripheral neuropathy. Continuing gabapentin.  5. Obstructive sleep apnea. Patient does not C Pap. Sleep study showed severe OSA. Recommended patient to remain compliant with sleep apnea. CPap ordered. 10 cm.  6. Essential hypertension. Blood pressure stable resuming home medications. Resume Lasix  DVT Prophylaxis: subcutaneous Heparin Nutrition: Cardiac diet Advance goals of care discussion: Full code  Brief Summary of Hospitalization:  HPI: As per the H and P dictated on admission, "she was recently admitted  in the hospital and discharged after workup for TIA. She was found to having intermittent fibrillation and discharged on eye liquids. PT recommended home health. She comes back with worsening symptoms of right-sided weakness and spatial slurring." Daily update, Procedures: MRI positive for left ICA territory infarct 01/28/2015. Consultants: Neurology, phone consultation with cardiology. Antibiotics: Anti-infectives    None      Family Communication: family was present at bedside, at the time of interview.  Opportunity was given to ask question and all questions were answered satisfactorily.   Disposition:  Expected discharge date: 01/31/2015 Barriers to safe discharge: Stroke workup  No intake or output data in the 24 hours ending 01/29/15 0900 Filed Weights   01/28/15 1045 01/28/15 2156  Weight: 92.08 kg (203 lb) 84.868 kg (187 lb 1.6 oz)    Objective: Physical Exam: Filed Vitals:   01/29/15 0149 01/29/15 0400 01/29/15 0559 01/29/15 0800  BP: 148/63 137/71 153/69 191/87  Pulse: 69 63 64 68  Temp: 99.1 F (37.3 C) 97.7 F (36.5 C) 97.9 F (36.6 C)   TempSrc: Oral Oral Oral   Resp: 17 19 18    Height:      Weight:      SpO2: 97% 94% 96% 94%     General: Appear in mild distress, no Rash; Oral Mucosa moist Cardiovascular: S1 and S2 Present, no Murmur, no JVD Respiratory: Bilateral Air entry present and Clear to Auscultation, no Crackles, no wheezes Abdomen: Bowel Sound present, Soft and no tenderness Extremities: no Pedal edema, no calf tenderness Neurology: Right facial droop, right-sided weakness.  Data Reviewed: CBC:  Recent Labs Lab 01/24/15 1337 01/24/15 1343 01/28/15 1222 01/28/15 1231  WBC 3.9*  --  3.3*  --   NEUTROABS 2.3  --  1.7  --   HGB 13.5 13.9 13.2 14.3  HCT 39.3 41.0 39.1 42.0  MCV 92.7  --  94.4  --   PLT 217  --  218  --    Basic Metabolic Panel:  Recent Labs Lab 01/24/15 1337 01/24/15 1343 01/28/15 1222 01/28/15 1231  NA 144 143 142  143  K 3.8 3.7 4.5 4.5  CL 107 106 107 105  CO2 25  --  25  --   GLUCOSE 92 91 92 92  BUN 17 19 17  25*  CREATININE 1.05* 1.00 1.12* 1.10*  CALCIUM 10.6*  --  10.1  --    Liver Function Tests:  Recent Labs Lab 01/24/15 1337 01/28/15 1222  AST 26 29  ALT 19 20  ALKPHOS 70 62  BILITOT 0.5 1.2  PROT 6.7 6.7  ALBUMIN 3.9 3.9   No results for input(s): LIPASE, AMYLASE in the last 168 hours. No results for input(s): AMMONIA in the last 168 hours.  Cardiac Enzymes: No results for input(s): CKTOTAL, CKMB, CKMBINDEX, TROPONINI in the last 168 hours.  BNP (last 3 results) No results for input(s): BNP in the last 8760 hours.  CBG: No results for input(s): GLUCAP in the last 168 hours.  No results found for this or any previous visit (from the past 240 hour(s)).   Studies: Mr Herby Abraham Contrast  01/28/2015  CLINICAL DATA:  75 year old hypertensive female with 3 day history of right-sided weakness and slurred speech. Subsequent encounter. EXAM: MRI HEAD WITHOUT CONTRAST TECHNIQUE: Multiplanar, multiecho pulse sequences of the brain and surrounding structures were obtained without intravenous contrast. COMPARISON:  01/25/2015. FINDINGS: Exam is motion degraded. 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 Mild to moderate chronic small vessel disease changes. Global atrophy. Ventricular prominence felt to be related to atrophy rather hydrocephalus. No intracranial mass lesion noted on this unenhanced exam. Major intracranial vascular structures are patent. Please see recent MR angiogram report. Ectatic right carotid terminus. 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 Transverse ligament hypertrophy. Exophthalmos. IMPRESSION: Exam is motion degraded. 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 Mild to moderate chronic small vessel disease changes. Global atrophy. Ventricular prominence felt to be related to atrophy rather  hydrocephalus. No intracranial mass lesion noted on this unenhanced exam. Major intracranial vascular structures are patent. Please see recent MR angiogram report. Ectatic right carotid terminus. 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 Exophthalmos. Electronically Signed   By: Genia Del M.D.   On: 01/28/2015 13:47     Scheduled Meds: . apixaban  5 mg Oral Q12H  . buPROPion  300 mg Oral Daily  . carvedilol  3.125 mg Oral BID WC  . cholecalciferol  1,000 Units Oral Daily  . ezetimibe  10 mg Oral Daily  . gabapentin  300 mg Oral QHS  . irbesartan  300 mg Oral Daily  . multivitamin with minerals  1 tablet Oral Daily  . pravastatin  20 mg Oral q1800  . vitamin B-12  100 mcg Oral Daily   Continuous Infusions:  PRN Meds:   Time spent: 30 minutes  Author: Berle Mull, MD [facility] Pager: [phone] 01/29/2015 9:00 AM  If 7PM-7AM, please contact night-coverage at [email or web address], password [facility]

## 2015-01-29 NOTE — Progress Notes (Signed)
Inpatient Rehabilitation  Patient was screened by Jalene Demo for appropriateness for an Inpatient Acute Rehab consult.  At this time, we are recommending Inpatient Rehab consult.  Please order if you are agreeable.    Gurleen Larrivee PT Inpatient Rehab Admissions Coordinator Cell [phone] Office [phone]   

## 2015-01-29 NOTE — Progress Notes (Signed)
VASCULAR LAB PRELIMINARY  PRELIMINARY  PRELIMINARY  PRELIMINARY  Bilateral lower extremity venous duplex completed.    Preliminary report:  There is no DVT or SVT noted in the bilateral lower extremities.   Jerri Hargadon, RVT 01/29/2015, 6:12 PM

## 2015-01-30 DIAGNOSIS — I5042 Chronic combined systolic (congestive) and diastolic (congestive) heart failure: Secondary | ICD-10-CM

## 2015-01-30 DIAGNOSIS — R471 Dysarthria and anarthria: Secondary | ICD-10-CM

## 2015-01-30 DIAGNOSIS — I480 Paroxysmal atrial fibrillation: Secondary | ICD-10-CM

## 2015-01-30 DIAGNOSIS — IMO0002 Reserved for concepts with insufficient information to code with codable children: Secondary | ICD-10-CM | POA: Insufficient documentation

## 2015-01-30 DIAGNOSIS — G4733 Obstructive sleep apnea (adult) (pediatric): Secondary | ICD-10-CM | POA: Diagnosis not present

## 2015-01-30 DIAGNOSIS — I10 Essential (primary) hypertension: Secondary | ICD-10-CM

## 2015-01-30 DIAGNOSIS — G8929 Other chronic pain: Secondary | ICD-10-CM

## 2015-01-30 DIAGNOSIS — I639 Cerebral infarction, unspecified: Principal | ICD-10-CM

## 2015-01-30 DIAGNOSIS — R413 Other amnesia: Secondary | ICD-10-CM

## 2015-01-30 DIAGNOSIS — M545 Low back pain: Secondary | ICD-10-CM | POA: Diagnosis not present

## 2015-01-30 DIAGNOSIS — I69359 Hemiplegia and hemiparesis following cerebral infarction affecting unspecified side: Secondary | ICD-10-CM

## 2015-01-30 LAB — COMPREHENSIVE METABOLIC PANEL
ALBUMIN: 3.6 g/dL (ref 3.5–5.0)
ALK PHOS: 63 U/L (ref 38–126)
ALT: 18 U/L (ref 14–54)
AST: 21 U/L (ref 15–41)
Anion gap: 7 (ref 5–15)
BILIRUBIN TOTAL: 0.8 mg/dL (ref 0.3–1.2)
BUN: 17 mg/dL (ref 6–20)
CO2: 28 mmol/L (ref 22–32)
CREATININE: 1.09 mg/dL — AB (ref 0.44–1.00)
Calcium: 10.1 mg/dL (ref 8.9–10.3)
Chloride: 106 mmol/L (ref 101–111)
GFR calc Af Amer: 55 mL/min — ABNORMAL LOW (ref >60)
GFR, EST NON AFRICAN AMERICAN: 48 mL/min — AB (ref >60)
GLUCOSE: 102 mg/dL — AB (ref 65–99)
POTASSIUM: 4 mmol/L (ref 3.5–5.1)
Sodium: 141 mmol/L (ref 135–145)
TOTAL PROTEIN: 6.5 g/dL (ref 6.5–8.1)

## 2015-01-30 LAB — CBC WITH DIFFERENTIAL/PLATELET
BASOS ABS: 0 10*3/uL (ref 0.0–0.1)
BASOS PCT: 1 %
Eosinophils Absolute: 0.2 10*3/uL (ref 0.0–0.7)
Eosinophils Relative: 4 %
HEMATOCRIT: 39.8 % (ref 36.0–46.0)
HEMOGLOBIN: 13.3 g/dL (ref 12.0–15.0)
LYMPHS PCT: 29 %
Lymphs Abs: 1.2 10*3/uL (ref 0.7–4.0)
MCH: 31.6 pg (ref 26.0–34.0)
MCHC: 33.4 g/dL (ref 30.0–36.0)
MCV: 94.5 fL (ref 78.0–100.0)
Monocytes Absolute: 0.5 10*3/uL (ref 0.1–1.0)
Monocytes Relative: 12 %
NEUTROS ABS: 2.3 10*3/uL (ref 1.7–7.7)
NEUTROS PCT: 54 %
Platelets: 216 10*3/uL (ref 150–400)
RBC: 4.21 MIL/uL (ref 3.87–5.11)
RDW: 12.8 % (ref 11.5–15.5)
WBC: 4.3 10*3/uL (ref 4.0–10.5)

## 2015-01-30 LAB — PROTIME-INR
INR: 1.24 (ref 0.00–1.49)
Prothrombin Time: 15.8 seconds — ABNORMAL HIGH (ref 11.6–15.2)

## 2015-01-30 MED ORDER — CARVEDILOL 6.25 MG PO TABS
6.2500 mg | ORAL_TABLET | Freq: Two times a day (BID) | ORAL | Status: DC
  Administered 2015-01-30 – 2015-01-31 (×3): 6.25 mg via ORAL
  Filled 2015-01-30 (×3): qty 1

## 2015-01-30 NOTE — Evaluation (Signed)
Occupational Therapy Evaluation Patient Details Name: Susan [name] MRN: [id number] DOB: [birth year] Today's Date: 01/30/2015    History of Present Illness Presented 01/28/2015 with progressive weakness and increased dysarthric speech and right side facial droop. 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    Clinical Impression   This 75 yo female admitted with above presents to acute OT with deficits below affecting her ability to care for herself at a Mod I to independent level as she prior to first admission on 01/26/2015. She will benefit from continued acute OT with follow up OT on CIR.    Follow Up Recommendations  CIR    Equipment Recommendations  None recommended by OT       Precautions / Restrictions Precautions Precautions: Fall Restrictions Weight Bearing Restrictions: No      Mobility Bed Mobility               General bed mobility comments: Pt up in recliner upon my arrival  Transfers Overall transfer level: Needs assistance Equipment used: Rolling walker (2 wheeled) Transfers: Sit to/from Stand Sit to Stand: Min guard         General transfer comment: with increased time and effort from recliner and 3n1    Balance Overall balance assessment: Needs assistance Sitting-balance support: No upper extremity supported;Feet supported Sitting balance-Leahy Scale: Fair     Standing balance support: Single extremity supported;During functional activity Standing balance-Leahy Scale: Poor Standing balance comment: Reliant for at least one hand on RW                             ADL Overall ADL's : Needs assistance/impaired Eating/Feeding: Modified independent   Grooming: Min guard;Standing   Upper Body Bathing: Set up;Supervision/ safety;Sitting   Lower Body Bathing: Min guard;Sit to/from stand   Upper Body Dressing : Set up;Supervision/safety;Sitting   Lower Body Dressing: Min guard;Sit to/from  stand   Toilet Transfer: Min guard;Ambulation;RW;BSC (over toilet)   Toileting- Clothing Manipulation and Hygiene: Min guard;Sit to/from stand         General ADL Comments: All tasks requiring increased time due to decreased proprioception in LUE and mild balance deficits when up on feet     Vision Vision Assessment?: No apparent visual deficits          Pertinent Vitals/Pain Pain Assessment: No/denies pain     Hand Dominance Right   Extremity/Trunk Assessment Upper Extremity Assessment Upper Extremity Assessment: RUE deficits/detail RUE Deficits / Details: AROM and strength overall WFL--however noted she did drop sock twice out of her right hand. Grip strength is 3+/5. Decreased fine motor coordination; pt reports hand as feeling "clumsy"--"and I drop things" RUE Sensation: decreased proprioception RUE Coordination: decreased fine motor           Communication Communication Communication: No difficulties   Cognition Arousal/Alertness: Awake/alert Behavior During Therapy: WFL for tasks assessed/performed Overall Cognitive Status: No family/caregiver present to determine baseline cognitive functioning       Memory: Decreased short-term memory                        Home Living Family/patient expects to be discharged to:: Inpatient rehab Living Arrangements: Alone Available Help at Discharge: Available PRN/intermittently;Family Type of Home: Independent living facility Home Access: Level entry     Home Layout: One level     Bathroom Shower/Tub: Occupational psychologist: Handicapped height  Home Equipment: Cane - quad;Shower seat;Grab bars - tub/shower;Bedside commode;Grab bars - toilet      Lives With: Alone    Prior Functioning/Environment Level of Independence: Needs assistance  Gait / Transfers Assistance Needed: just d/c home after TIA, had initial HHPT visit, using RW     Comments: uses quad cane in community and no device  in home. Pts family reports that she frequently furniture walks at home    OT Diagnosis: Generalized weakness (RUE weakness)   OT Problem List: Decreased strength;Impaired balance (sitting and/or standing);Obesity;Impaired UE functional use   OT Treatment/Interventions: Self-care/ADL training;Patient/family education;Therapeutic activities;Therapeutic exercise;Balance training    OT Goals(Current goals can be found in the care plan section) Acute Rehab OT Goals Patient Stated Goal: to go to rehab and then home this time OT Goal Formulation: With patient Time For Goal Achievement: 02/06/15 Potential to Achieve Goals: Good  OT Frequency: Min 3X/week   Barriers to D/C: Decreased caregiver support             End of Session Equipment Utilized During Treatment: Rolling walker  Activity Tolerance: Patient tolerated treatment well Patient left: in chair;with call bell/phone within reach;with chair alarm set   Time: 1350-1425 OT Time Calculation (min): 35 min Charges:  OT General Charges $OT Visit: 1 Procedure OT Evaluation $OT Eval Moderate Complexity: 1 Procedure OT Treatments $Self Care/Home Management : 8-22 mins  Almon Register [id number] 01/30/2015, 2:50 PM

## 2015-01-30 NOTE — Progress Notes (Signed)
[facility] Progress Note  Patient: Susan [name] [id number]   PCP: [name], MD DOB: 09-May-1940   DOA: 01/28/2015   DOS: 01/30/2015   Date of Service: the patient was seen and examined on 01/30/2015  Subjective: continues to have weakness in right sided movement and facial droop, no chest pain or abdominal pain. Also has some tingling in fingers and legs Nutrition: able to tolerate oral diet Activity: ambulating in the room Last BM: 01/30/2015  Assessment and Plan: 1. Acute ischemic stroke [facility]) Recently presented for TIA, due to diagnosis of atrial fibrillation patient was discharged on Apixaban. Patient had worsening symptoms of right-sided weakness as well as facial droop after discharge and comes back to the hospital with positive MRI for acute left ICA infarct. Neurologically she remains stable. Continue apixaban, Pravachol, Zetia. CIR consult appreciated.  2. Leg tenderness. Negative duplex for DVT, continue lasix and add teds.  3. Chronic combined CHF. Ejection fraction 3035%. Suspected ischemic etiology. As per discussion with cardiology the patient will need outpatient workup for stress test once she is better from the acute CVA. Requested cardiology consult per inpatient rehab, for clearance prior to intensive rehabilitation.  4. Peripheral neuropathy. Continuing gabapentin.  5. Obstructive sleep apnea. Patient does not C Pap. Sleep study showed severe OSA. Recommended patient to remain compliant with sleep apnea. CPap ordered. 10 cm.  6. Essential hypertension. Blood pressure stable resuming home medications. Resume Lasix  DVT Prophylaxis: subcutaneous Heparin Nutrition: Cardiac diet Advance goals of care discussion: Full code  Brief Summary of Hospitalization:  HPI: As per the H and P dictated on admission, "she was recently admitted in the hospital and discharged after workup for TIA. She was found to having intermittent fibrillation and  discharged on eye liquids. PT recommended home health. She comes back with worsening symptoms of right-sided weakness and spatial slurring." Daily update, Procedures: MRI positive for left ICA territory infarct 01/28/2015. Consultants: Neurology, phone consultation with cardiology. Antibiotics: Anti-infectives    None      Family Communication: no family was present at bedside, at the time of interview.   Disposition:  Expected discharge date: 01/31/2015 Barriers to safe discharge: Stroke workup  No intake or output data in the 24 hours ending 01/30/15 1442 Filed Weights   01/28/15 1045 01/28/15 2156  Weight: 92.08 kg (203 lb) 84.868 kg (187 lb 1.6 oz)    Objective: Physical Exam: Filed Vitals:   01/29/15 2246 01/30/15 0515 01/30/15 0813 01/30/15 1035  BP: 148/75 169/99 176/77 160/72  Pulse: 70 59 59 65  Temp: 98.7 F (37.1 C) 97.4 F (36.3 C)  98.7 F (37.1 C)  TempSrc: Oral Axillary  Oral  Resp: 18 18  18   Height:      Weight:      SpO2: 100% 99%  99%    General: Appear in mild distress, no Rash; Oral Mucosa moist Cardiovascular: S1 and S2 Present, no Murmur, no JVD Respiratory: Bilateral Air entry present and Clear to Auscultation, no Crackles, no wheezes Abdomen: Bowel Sound present, Soft and no tenderness Extremities: no Pedal edema, no calf tenderness Neurology: Right facial droop, right-sided weakness.  Data Reviewed: CBC:  Recent Labs Lab 01/24/15 1337 01/24/15 1343 01/28/15 1222 01/28/15 1231 01/29/15 1225 01/30/15 0730  WBC 3.9*  --  3.3*  --  3.9* 4.3  NEUTROABS 2.3  --  1.7  --  2.3 2.3  HGB 13.5 13.9 13.2 14.3 13.4 13.3  HCT 39.3 41.0 39.1 42.0 40.4 39.8  MCV  92.7  --  94.4  --  95.1 94.5  PLT 217  --  218  --  244 [id number]   Basic Metabolic Panel:  Recent Labs Lab 01/24/15 1337 01/24/15 1343 01/28/15 1222 01/28/15 1231 01/29/15 1225 01/30/15 0730  NA 144 143 142 143 142 141  K 3.8 3.7 4.5 4.5 3.9 4.0  CL 107 106 107 105 107 106  CO2  25  --  25  --  27 28  GLUCOSE 92 91 92 92 128* 102*  BUN 17 19 17  25* 17 17  CREATININE 1.05* 1.00 1.12* 1.10* 1.06* 1.09*  CALCIUM 10.6*  --  10.1  --  10.3 10.1   Liver Function Tests:  Recent Labs Lab 01/24/15 1337 01/28/15 1222 01/29/15 1225 01/30/15 0730  AST 26 29 25 21   ALT 19 20 20 18   ALKPHOS 70 62 67 63  BILITOT 0.5 1.2 0.8 0.8  PROT 6.7 6.7 7.0 6.5  ALBUMIN 3.9 3.9 3.7 3.6   Studies: No results found.   Scheduled Meds: . apixaban  5 mg Oral Q12H  . buPROPion  300 mg Oral Daily  . carvedilol  3.125 mg Oral BID WC  . cholecalciferol  1,000 Units Oral Daily  . docusate sodium  100 mg Oral Daily  . ezetimibe  10 mg Oral Daily  . furosemide  20 mg Oral Daily  . gabapentin  300 mg Oral QHS  . irbesartan  300 mg Oral Daily  . multivitamin with minerals  1 tablet Oral Daily  . pravastatin  20 mg Oral q1800  . vitamin B-12  100 mcg Oral Daily   Continuous Infusions:  PRN Meds:   Time spent: 30 minutes  Author: Berle Mull, MD [facility] Pager: [phone] 01/30/2015 2:42 PM  If 7PM-7AM, please contact night-coverage at [email or web address], password [email or web address]

## 2015-01-30 NOTE — Evaluation (Signed)
Speech Language Pathology Evaluation Patient Details Name: PHILLICIA DELAROCA MRN: [id number] DOB: 1940/06/04 Today's Date: 01/30/2015 Time: 1130-1150 SLP Time Calculation (min) (ACUTE ONLY): 20 min  Problem List:  Patient Active Problem List   Diagnosis Date Noted  . 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 Acute ischemic stroke (Left internal capsule) 01/28/2015  . Chronic low back pain 01/28/2015  . Short-term memory loss 01/28/2015  . Volume depletion 01/28/2015  . Paroxysmal atrial fibrillation ([place]) 01/26/2015  . DCM (dilated cardiomyopathy) ([place]) 01/26/2015  . TIA (transient ischemic attack) 01/24/2015  . 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 Obesity (BMI 30-39.9) 01/27/2013  . Constipation 09/02/2012  . H/O arthroscopy of right knee 08/06/2012  . Anxiety   . Depression   . GERD (gastroesophageal reflux disease)   . Hypertension   . OSA (obstructive sleep apnea)   . Osteoarthritis of right knee   . Hyperparathyroidism, primary ([place]) 04/22/2012  . Chronic cough 10/02/2011   Past Medical History:  Past Medical History  Diagnosis Date  . Hyperlipidemia   . Pedal edema   . Spinal stenosis   . Varicose veins     "BLE" (07/29/2012)  . Primary hyperparathyroidism ([place])   . Thyroid disease   . Incontinent of urine     wears pads  . Family history of anesthesia complication     "some wake up during OR; some are hard to wake up; some both" (07/29/2012)  . OSA on CPAP   . Arthritis     "legs, back" (07/29/2012)  . Osteoarthritis of right knee   . Umbilical hernia     "unrepaired" (07/29/2012)  . Chronic lower back pain   . History of stomach ulcers 1980's  . Anxiety   . Depression   . GERD (gastroesophageal reflux disease)   . Hypertension   . Chronic diastolic CHF (congestive heart failure) ([place])   . Complication of anesthesia     "I have  apnea" (07/29/2012)  . Dementia     per family   Past Surgical History:  Past Surgical History  Procedure Laterality Date  . Iud removal  1980's  . Parathyroidectomy N/A 06/16/2012    Procedure: NECK EXPLORATION AND LEFT SUPERIOR PARATHYROIDECTOMY;  Surgeon: Earnstine Regal, MD;  Location: [facility];  Service: General;  Laterality: N/A;  . Colonoscopy    . 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 Cholecystectomy  ~ 2010  . Total knee arthroplasty Right 07/29/2012    Procedure: TOTAL KNEE ARTHROPLASTY;  Surgeon: Ninetta Lights, MD;  Location: [facility];  Service: Orthopedics;  Laterality: Right;   HPI:  POCAHONTAS CRUS is a 75 y.o. female with a Past Medical History of HLD, hypothyroidism, OSA, depression, anxiety, GERD, HTN, CHF, dementia who presents with likely TIA   Assessment / Plan / Recommendation Clinical Impression  Pt demonstrates mild dysarthria due to mild CN VII weakness and lingual sensation. Pt is 100% intelligible. Pt also demonstrated mild memory deficits including impaired retrieval of short term memory, requiring min verbal cues for recall. Word finding impairment of abstract words in conversation noted with pt utilizing circumlocution as a compensatory strategy, could also be related to memory. Recommend f/u with CIR to address dysarthria and memory compensatory strategies prior to return home. No further acute needs at this time. If pt does not go to CIR, suggest home  health f/u.     SLP Assessment  All further Speech Lanaguage Pathology  needs can be addressed in the next venue of care    Follow Up Recommendations  Inpatient Rehab    Frequency and Duration           SLP Evaluation Prior Functioning  Cognitive/Linguistic Baseline: Baseline deficits Baseline deficit details: mild memory deficits which have become more prominent in the last several months to a year Type of Home: Independent living facility  Lives With: Alone Available Help at Discharge: Available  PRN/intermittently;Family Education: bachelor's Vocation: Retired   Clinical biochemist Status: History of cognitive impairments - at baseline Arousal/Alertness: Awake/alert Orientation Level: Oriented X4 Attention: Alternating Alternating Attention: Appears intact Memory: Impaired Memory Impairment: Decreased short term memory Decreased Short Term Memory: Verbal basic Awareness: Appears intact Problem Solving: Appears intact Safety/Judgment: Appears intact    Comprehension  Auditory Comprehension Overall Auditory Comprehension: Appears within functional limits for tasks assessed    Expression Verbal Expression Overall Verbal Expression: Impaired Initiation: No impairment Automatic Speech: Name;Social Response Level of Generative/Spontaneous Verbalization: Conversation Repetition: No impairment Naming: Impairment Responsive: 51-75% accurate Confrontation: Within functional limits Convergent: Not tested Other Naming Comments: some difficulty finding abstract meaning words in conversation Verbal Errors: Aware of errors Effective Techniques: Semantic cues Written Expression Dominant Hand: Right   Oral / Motor  Oral Motor/Sensory Function Overall Oral Motor/Sensory Function: Mild impairment Facial ROM: Within Functional Limits Facial Symmetry: Abnormal symmetry right;Suspected CN VII (facial) dysfunction Facial Strength: Within Functional Limits Lingual ROM: Within Functional Limits Lingual Symmetry: Abnormal symmetry right;Suspected CN XII (hypoglossal) dysfunction Lingual Strength: Within Functional Limits;Suspected CN XII (hypoglossal) dysfunction Lingual Sensation: Reduced;Suspected CN VII (facial) dysfunction-anterior 2/3 tongue Velum: Within Functional Limits Mandible: Within Functional Limits Motor Speech Overall Motor Speech: Impaired Respiration: Within functional limits Phonation: Normal Resonance: Within functional limits Articulation:  Impaired Level of Impairment: Word Intelligibility: Intelligible Motor Planning: Witnin functional limits Motor Speech Errors: Aware   GO                   Herbie Baltimore, MA CCC-SLP [phone]  Lynann Beaver 01/30/2015, 1:26 PM

## 2015-01-30 NOTE — Progress Notes (Signed)
Physical Therapy Treatment Patient Details Name: Susan [name] MRN: [id number] DOB: 1940-08-01 Today's Date: 01/30/2015    History of Present Illness Presented 01/28/2015 with progressive weakness and increased dysarthric speech and right side facial droop. 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     PT Comments    Patient eager to improve her balance and mobility. Requires repetition with carryover of techniques from beginning to end of session. ?memory deficits and ability to recall information next visit.    Follow Up Recommendations  CIR     Equipment Recommendations  None recommended by PT    Recommendations for Other Services       Precautions / Restrictions Precautions Precautions: Fall    Mobility  Bed Mobility                  Transfers Overall transfer level: Needs assistance Equipment used: Rolling walker (2 wheeled) Transfers: Sit to/from Stand Sit to Stand: Min assist;Mod assist         General transfer comment: repeated x 8; initial mod assist due to posterior bias; initial vc for safe use of RW  Ambulation/Gait Ambulation/Gait assistance: Min assist Ambulation Distance (Feet): 35 Feet (seated rest, 35) Assistive device: Rolling walker (2 wheeled) Gait Pattern/deviations: Step-through pattern;Decreased stride length;Shuffle;Trunk flexed Gait velocity:   Gait velocity interpretation: Below normal speed for age/gender General Gait Details: pt able to incr stride length, however states it increases chronic back pain; mod cues for proper use of RW and upright posture; as fatigues her Rt foot shuffling/dragging   Stairs            Wheelchair Mobility    Modified Rankin (Stroke Patients Only) Modified Rankin (Stroke Patients Only) Pre-Morbid Rankin Score: Slight disability Modified Rankin: Moderately severe disability     Balance Overall balance assessment: Needs assistance   Sitting  balance-Leahy Scale: Fair     Standing balance support: No upper extremity supported Standing balance-Leahy Scale: Fair                      Cognition Arousal/Alertness: Awake/alert Behavior During Therapy: WFL for tasks assessed/performed Overall Cognitive Status: No family/caregiver present to determine baseline cognitive functioning       Memory: Decreased short-term memory              Exercises Other Exercises Other Exercises: repeated sit to stand x 8; use of armrests to stand; hands placed on thighs to sit; vc and min assist for anterior weight shift for come to stand and sit; uncontrolled descent final ~6-8 inches due to leg weakness and posterior bias    General Comments        Pertinent Vitals/Pain Pain Assessment: No/denies pain    Home Living     Available Help at Discharge: Available PRN/intermittently;Family Type of Home: Independent living facility              Prior Function            PT Goals (current goals can now be found in the care plan section) Acute Rehab PT Goals Patient Stated Goal: to be stronger and more independent before going home Time For Goal Achievement: 02/12/15 Progress towards PT goals: Progressing toward goals    Frequency  Min 4X/week    PT Plan Current plan remains appropriate    Co-evaluation             End of Session Equipment Utilized During Treatment: Gait belt  Activity Tolerance: Patient tolerated treatment well Patient left: in chair;with call bell/phone within reach;Other (comment) (OT arriving)     Time: [id number] PT Time Calculation (min) (ACUTE ONLY): 29 min  Charges:  $Gait Training: 23-37 mins                    G Codes:      Lurie Mullane 02-06-15, 2:27 PM Pager [phone]

## 2015-01-30 NOTE — Consult Note (Signed)
Admit date: 01/28/2015 Referring Physician  Dr. Posey Pronto Primary Physician [name], MD Primary Cardiologist  Dr. Radford Pax Reason for Consultation  Pre rehab cardiovascular evaluation, in need of aggressive therapy.  HPI:   75 year old female patient of Dr. 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  Thankfully, she is not having any chest discomfort, no shortness of breath, no syncope, no bleeding. Her speech and motor skills are improving.  She does not recall previously having chest discomfort.      PMH:   Past Medical History  Diagnosis Date  . Hyperlipidemia   . Pedal edema   . Spinal stenosis   . Varicose veins     "BLE" (07/29/2012)  . Primary hyperparathyroidism ([place])   . Thyroid disease   . Incontinent of urine     wears pads  . Family history of anesthesia complication     "some wake up during OR; some are hard to wake up; some both" (07/29/2012)  . OSA on CPAP   . Arthritis     "legs, back" (07/29/2012)  . Osteoarthritis of right knee   . Umbilical hernia     "unrepaired" (07/29/2012)  . Chronic lower back pain   . History of stomach ulcers 1980's  . Anxiety   . Depression   . GERD (gastroesophageal reflux disease)   . Hypertension   . Chronic diastolic CHF (congestive heart failure) ([place])   . Complication of anesthesia     "I have apnea" (07/29/2012)  . Dementia     per family    PSH:   Past Surgical History  Procedure Laterality Date  . Iud removal  1980's  . Parathyroidectomy N/A 06/16/2012    Procedure: NECK EXPLORATION AND LEFT SUPERIOR PARATHYROIDECTOMY;  Surgeon: Earnstine Regal, MD;  Location: [facility];  Service: General;  Laterality: N/A;  . Colonoscopy    . Total  knee arthroplasty Right 07/29/2012  . Cholecystectomy  ~ 2010  . Total knee arthroplasty Right 07/29/2012    Procedure: TOTAL KNEE ARTHROPLASTY;  Surgeon: Ninetta Lights, MD;  Location: [facility];  Service: Orthopedics;  Laterality: Right;   Allergies:  Lipitor; Simvastatin; and Ace inhibitors Prior to Admit Meds:   Prior to Admission medications   Medication Sig Start Date End Date Taking? Authorizing Provider  apixaban (ELIQUIS) 5 MG TABS tablet Take 1 tablet (5 mg total) by mouth every 12 (twelve) hours. 01/26/15  Yes Ripudeep Krystal Eaton, MD  aspirin EC 81 MG tablet Take 81 mg by mouth daily.   Yes Historical Provider, MD  buPROPion (WELLBUTRIN XL) 300 MG 24 hr tablet Take 300 mg by mouth daily. 06/03/14  Yes Historical Provider, MD  carvedilol (COREG) 6.25 MG tablet Take 0.5 tablets (3.125 mg total) by mouth 2 (two) times daily with a meal. 01/26/15  Yes Ripudeep K Rai, MD  cholecalciferol (VITAMIN D) 1000 units tablet Take 1,000 Units by mouth daily.   Yes Historical Provider, MD  ezetimibe (ZETIA) 10 MG tablet Take 1 tablet (10 mg total) by mouth daily. 01/26/15  Yes Ripudeep Krystal Eaton, MD  furosemide (LASIX) 20 MG tablet Take 20 mg by mouth daily. 11/07/14  Yes Historical Provider, MD  gabapentin (NEURONTIN) 300 MG capsule Take 1 capsule (300 mg total) by mouth  at bedtime. 01/26/15  Yes Ripudeep Krystal Eaton, MD  Multiple Vitamin (MULTIVITAMIN WITH MINERALS) TABS Take 1 tablet by mouth daily.   Yes Historical Provider, MD  pravastatin (PRAVACHOL) 20 MG tablet Take 20 mg by mouth every morning. 07/21/13  Yes Historical Provider, MD  valsartan (DIOVAN) 320 MG tablet TAKE 1 TABLET BY MOUTH EVERY DAY 10/21/14  Yes Sueanne Margarita, MD  vitamin B-12 (CYANOCOBALAMIN) 100 MCG tablet Take 100 mcg by mouth daily.   Yes Historical Provider, MD   Fam HX:    Family History  Problem Relation Age of Onset  . Hypertension Mother     deceased  . Lung cancer Father     deceased  . Stroke Mother    Social HX:    Social  History   Social History  . Marital Status: Widowed    Spouse Name: N/A  . Number of Children: N/A  . Years of Education: N/A   Occupational History  . retired    Social History Main Topics  . Smoking status: Former Smoker -- 1.00 packs/day for 30 years    Types: Cigarettes    Quit date: 01/07/1978  . Smokeless tobacco: Never Used  . Alcohol Use: No  . Drug Use: No  . Sexual Activity: No   Other Topics Concern  . Not on file   Social History Narrative     ROS:  All 11 ROS were addressed and are negative except what is stated in the HPI   Physical Exam: Blood pressure 160/72, pulse 65, temperature 98.7 F (37.1 C), temperature source Oral, resp. rate 18, height 5\' 5"  (1.651 m), weight 187 lb 1.6 oz (84.868 kg), SpO2 99 %.   General: Well developed, well nourished, in no acute distress, sitting in chair, mild impairment in speech Head: Facial droop noted Eyes PERRLA, No xanthomas.   Normal cephalic and atramatic  Lungs:   Clear bilaterally to auscultation and percussion. Normal respiratory effort. No wheezes, no rales. Heart:   HRRR S1 S2 Pulses are 2+ & equal. No murmur, rubs, gallops.  No carotid bruit. No JVD.  No abdominal bruits.  Abdomen: Bowel sounds are positive, abdomen soft and non-tender without masses. No hepatosplenomegaly. Obese  Msk:  Back normal. Normal strength and tone for age. Extremities:  No clubbing, cyanosis or edema.  DP +1 Neuro: Alert and oriented X 3, focal weakness noted, right-sided, MAE x 4 GU: Deferred Rectal: Deferred Psych:  Good affect, responds appropriately      Labs: Lab Results  Component Value Date   WBC 4.3 01/30/2015   HGB 13.3 01/30/2015   HCT 39.8 01/30/2015   MCV 94.5 01/30/2015   PLT 216 01/30/2015     Recent Labs Lab 01/30/15 0730  NA 141  K 4.0  CL 106  CO2 28  BUN 17  CREATININE 1.09*  CALCIUM 10.1  PROT 6.5  BILITOT 0.8  ALKPHOS 63  ALT 18  AST 21  GLUCOSE 102*   No results for input(s): CKTOTAL,  CKMB, TROPONINI in the last 72 hours. Lab Results  Component Value Date   CHOL 204* 01/25/2015   HDL 51 01/25/2015   LDLCALC 122* 01/25/2015   TRIG 156* 01/25/2015   Lab Results  Component Value Date   DDIMER 0.87* 07/26/2013     Radiology:  No results found. Personally viewed.  Echocardiogram 01/25/15:  - Left ventricle: The cavity size was normal. Wall thickness was increased in a pattern of moderate LVH.  Systolic function was  moderately to severely reduced. The estimated ejection fraction was in the range of 30% to 35%. Diffuse hypokinesis. Doppler parameters are consistent with abnormal left ventricular relaxation (grade 1 diastolic dysfunction). - Mitral valve: Calcified annulus. - Left atrium: The atrium was severely dilated. - Pulmonary arteries: Systolic pressure was mildly increased. PA peak pressure: 38 mm Hg (S).  Impressions:  - Moderate to severe global reduction in LV function; moderate LVH; grade 1 diastolic dysfunction; severe LAE; trace MR and TR; mildly elevated pulmonary pressure.  Prior echocardiogram 04/13/13 showed EF of 50-55%  Nuclear stress test 04/14/13-normal per Dr. Radford Pax  CT angiogram of chest on 03/05/13 showed no significant coronary artery calcifications, personally viewed  EKG: 01/29/15-sinus rhythm, left anterior fascicular block, nonspecific ST-T wave changes, possible lateral ischemia. No significant change from prior ECG on 01/16/15. Personally viewed.   Troponin normal  Scheduled Meds: . apixaban  5 mg Oral Q12H  . buPROPion  300 mg Oral Daily  . carvedilol  3.125 mg Oral BID WC  . cholecalciferol  1,000 Units Oral Daily  . docusate sodium  100 mg Oral Daily  . ezetimibe  10 mg Oral Daily  . furosemide  20 mg Oral Daily  . gabapentin  300 mg Oral QHS  . irbesartan  300 mg Oral Daily  . multivitamin with minerals  1 tablet Oral Daily  . pravastatin  20 mg Oral q1800  . vitamin B-12  100 mcg Oral Daily   Continuous  Infusions:  PRN Meds:.   ASSESSMENT/PLAN:    75 year old with acute stroke, acute systolic heart failure/cardiomyopathy EF 30-35% with unchanged EKG about to undergo aggressive rehabilitation.  Cardiovascular risk assessment prior to outpatient aggressive rehabilitation for stroke  -  recent nuclear stress test 04/14/13 was normal. Of course ejection fraction newly discovered reduced in the 30-35% range.   - she may proceed with aggressive rehabilitation. Watch for any signs of exertional anginal symptoms. She is having none currently.   - Ejection fraction may be reduced in the setting of acute stroke and hopefully will improve over the next several weeks.  - Recommendation is to repeat echocardiogram in 3 months.  - At this point, I would not hesitate to pursue aggressive rehabilitation efforts as long as she is not having any active anginal symptoms.  - Given her acute stroke, cardiac catheterization would be at increased risk for post procedural complications. However, if anginal symptoms develop become quite significant, we may wish to proceed in this direction sooner rather than later.  Cardiomyopathy  -  EF 30-35% global hypokinesis, question ischemic versus nonischemic.  - Recent nuclear stress test did not show any evidence of ischemia  -  On Coreg 3.125 BID  - I will increase carvedilol to 6.25 mg twice a day.  - On ARB  - Does not appear to be volume overloaded  - She is back on her furosemide 20 mg once a day.  - Would repeat echocardiogram in 3 months.  - If no resolution of EF, likely proceed with cardiac catheterization.  - CT angiogram of chest back in 2015 did not show any significant coronary calcification.   Obstructive sleep apnea  - Continue with CPAP  - per Dr. Radford Pax as outpatient  Stroke  - Per primary team  Paroxysmal atrial fibrillation  - Continue with Eliquis 5 mg twice a day  - Aggressive secondary prevention  - She is on pravastatin as well as Zetia   - She has had statin intolerance in  the past. Unable to take high-dose.  Please call as we can be of any further assistance.  Candee Furbish, MD  01/30/2015  2:42 PM

## 2015-01-30 NOTE — Consult Note (Signed)
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 right handed female with history of sleep apnea, hypertension, primary hyperparathyroidism and chronic low back pain with plans for eventual surgical intervention. By report patient lives alone at [facility] and was receiving home health physical and occupational therapy from latest discharge using a rolling walker. Recently admitted for TIA symptoms discharge 01/26/2015 with MRI negative. MRA showed extensive intracranial atherosclerotic type disease. Echocardiogram with ejection fraction of 35% diffuse hypokinesis with plan to pursue outpatient ischemic evaluation. Carotid Dopplers negative. During that most recent hospitalization episode of PAF placed on Eliquis per cardiology services and aspirin discontinued. Recommendations were made for home health therapies. Presented 01/28/2015 with progressive weakness and increased dysarthric speech and right side facial droop. 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 Patient did not receive TPA. Neurology follow-up advised to continue Eliquis. Tolerating a regular consistency diet. Venous Doppler studies negative for DVT. Physical therapy evaluation completed 01/29/2015 with recommendations of physical medicine rehabilitation consult.  Review of Systems  Constitutional: Negative for fever and chills.  HENT: Negative for hearing loss.   Eyes: Negative for blurred vision and double vision.  Cardiovascular: Positive for palpitations. Negative for chest pain.  Gastrointestinal: Positive for constipation. Negative for nausea and vomiting.       GERD  Genitourinary:       Stress incontinence  Musculoskeletal: Positive for back pain.  Skin: Negative for rash.  Neurological: Positive for tingling (B/l  feet) and weakness. Negative for seizures, loss of consciousness and headaches.  Psychiatric/Behavioral: Positive for memory loss.       Anxiety  All other systems reviewed and are negative.  Past Medical History  Diagnosis Date  . Hyperlipidemia   . Pedal edema   . Spinal stenosis   . Varicose veins     "BLE" (07/29/2012)  . Primary hyperparathyroidism ([place])   . Thyroid disease   . Incontinent of urine     wears pads  . Family history of anesthesia complication     "some wake up during OR; some are hard to wake up; some both" (07/29/2012)  . OSA on CPAP   . Arthritis     "legs, back" (07/29/2012)  . Osteoarthritis of right knee   . Umbilical hernia     "unrepaired" (07/29/2012)  . Chronic lower back pain   . History of stomach ulcers 1980's  . Anxiety   . Depression   . GERD (gastroesophageal reflux disease)   . Hypertension   . Chronic diastolic CHF (congestive heart failure) ([place])   . Complication of anesthesia     "I have apnea" (07/29/2012)  . Dementia     per family   Past Surgical History  Procedure Laterality Date  . Iud removal  1980's  . Parathyroidectomy N/A 06/16/2012    Procedure: NECK EXPLORATION AND LEFT SUPERIOR PARATHYROIDECTOMY;  Surgeon: Earnstine Regal, MD;  Location: [facility];  Service: General;  Laterality: N/A;  . Colonoscopy    . 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 Cholecystectomy  ~ 2010  . Total knee arthroplasty Right 07/29/2012    Procedure: TOTAL KNEE ARTHROPLASTY;  Surgeon: Ninetta Lights, MD;  Location: [facility];  Service: Orthopedics;  Laterality: Right;   Family History  Problem Relation Age of Onset  . Hypertension Mother     deceased  .  Lung cancer Father     deceased  . Stroke Mother    Social History:  reports that she quit smoking about 37 years ago. Her smoking use included Cigarettes. She has a 30 pack-year smoking history. She has never used smokeless tobacco. She reports that she does not drink alcohol or use illicit  drugs. Allergies:  Allergies  Allergen Reactions  . Lipitor [Atorvastatin] Swelling  . Simvastatin Other (See Comments)    Myalgias     . Ace Inhibitors Cough   Medications Prior to Admission  Medication Sig Dispense Refill  . apixaban (ELIQUIS) 5 MG TABS tablet Take 1 tablet (5 mg total) by mouth every 12 (twelve) hours. 60 tablet 4  . aspirin EC 81 MG tablet Take 81 mg by mouth daily.    Marland Kitchen buPROPion (WELLBUTRIN XL) 300 MG 24 hr tablet Take 300 mg by mouth daily.  12  . carvedilol (COREG) 6.25 MG tablet Take 0.5 tablets (3.125 mg total) by mouth 2 (two) times daily with a meal. 60 tablet 3  . cholecalciferol (VITAMIN D) 1000 units tablet Take 1,000 Units by mouth daily.    Marland Kitchen ezetimibe (ZETIA) 10 MG tablet Take 1 tablet (10 mg total) by mouth daily. 30 tablet 3  . furosemide (LASIX) 20 MG tablet Take 20 mg by mouth daily.  2  . gabapentin (NEURONTIN) 300 MG capsule Take 1 capsule (300 mg total) by mouth at bedtime. 30 capsule 3  . Multiple Vitamin (MULTIVITAMIN WITH MINERALS) TABS Take 1 tablet by mouth daily.    . pravastatin (PRAVACHOL) 20 MG tablet Take 20 mg by mouth every morning.    . valsartan (DIOVAN) 320 MG tablet TAKE 1 TABLET BY MOUTH EVERY DAY 30 tablet 11  . vitamin B-12 (CYANOCOBALAMIN) 100 MCG tablet Take 100 mcg by mouth daily.      Home: Home Living Family/patient expects to be discharged to:: Inpatient rehab Living Arrangements: Alone Available Help at Discharge: Available PRN/intermittently, Family Type of Home: Independent living facility Home Access: Level entry Home Layout: One level Bathroom Shower/Tub: Multimedia programmer: Handicapped height Home Equipment: Radio producer - quad, Civil engineer, contracting, Grab bars - tub/shower, Bedside commode  Lives With: Alone  Functional History: Prior Function Level of Independence: Needs assistance Gait / Transfers Assistance Needed: just d/c home after TIA, had initial HHPT visit, using RW, but had this event precipitating  readmission Comments: uses quad cane in community and no device in home. Pts family reports that she frequently furniture walks at home Functional Status:  Mobility: Bed Mobility Overal bed mobility: Needs Assistance Bed Mobility: Supine to Sit Supine to sit: Min assist General bed mobility comments: performed with HOB up this session; incr time to complete, needs min assist to pivot around with right hip to sit square at EOB Transfers Overall transfer level: Needs assistance Equipment used: Rolling walker (2 wheeled) Transfers: Sit to/from Stand Sit to Stand: Min assist General transfer comment: verbal cues for hand placement and problem solving best/safest technique, to reach back and control descent, to align with surface, and to manage anxiety Ambulation/Gait Ambulation/Gait assistance: Min assist Ambulation Distance (Feet): 2 Feet (pt anxious, only took 2-3 steps foward/backward with RW) Assistive device: Rolling walker (2 wheeled) Gait Pattern/deviations: Step-to pattern General Gait Details: side stepping and forward/backward stepping with verbal cue and manual facilitation to right knee/hip for 'pregait' activities to sequence and maximize benefit or RW to limit fear of load bearing through impacted limb; pt initally anxious, but over time and  with success gained confidence Gait velocity:      ADL:    Cognition: Cognition Overall Cognitive Status: History of cognitive impairments - at baseline Orientation Level: Oriented X4 Cognition Arousal/Alertness: Awake/alert Behavior During Therapy: WFL for tasks assessed/performed Overall Cognitive Status: History of cognitive impairments - at baseline Memory: Decreased short-term memory  Blood pressure 169/99, pulse 59, temperature 97.4 F (36.3 C), temperature source Axillary, resp. rate 18, height 5\' 5"  (1.651 m), weight 84.868 kg (187 lb 1.6 oz), SpO2 99 %. Physical Exam  Vitals reviewed. Constitutional: She appears  well-developed and well-nourished.  HENT:  Head: Normocephalic and atraumatic.  Eyes: Conjunctivae and EOM are normal.  Neck: Normal range of motion. Neck supple. No thyromegaly present.  Cardiovascular: Normal rate and regular rhythm.   Respiratory: Effort normal and breath sounds normal. No respiratory distress.  GI: Soft. Bowel sounds are normal. She exhibits no distension.  Musculoskeletal: She exhibits no edema or tenderness.  Neurological: She is alert.  Patient provider age name and date of birth.  Limited medical historian.  Follows simple commands Right facial weakness, right tongue deviation Mild dysarthria Motor: RUE/RLE: proximally 4+/5, distally 4/5 LUE/LLE: 5/5 proximal to distal DTRs: mildly hyperreflexic RUE Sensation intact to light touch  Skin: Skin is warm and dry.  Psychiatric: She has a normal mood and affect. Her behavior is normal.    Results for orders placed or performed during the hospital encounter of 01/28/15 (from the past 24 hour(s))  CBC with Differential/Platelet     Status: Abnormal   Collection Time: 01/29/15 12:25 PM  Result Value Ref Range   WBC 3.9 (L) 4.0 - 10.5 K/uL   RBC 4.25 3.87 - 5.11 MIL/uL   Hemoglobin 13.4 12.0 - 15.0 g/dL   HCT 40.4 36.0 - 46.0 %   MCV 95.1 78.0 - 100.0 fL   MCH 31.5 26.0 - 34.0 pg   MCHC 33.2 30.0 - 36.0 g/dL   RDW 13.1 11.5 - 15.5 %   Platelets 244 150 - 400 K/uL   Neutrophils Relative % 58 %   Neutro Abs 2.3 1.7 - 7.7 K/uL   Lymphocytes Relative 27 %   Lymphs Abs 1.1 0.7 - 4.0 K/uL   Monocytes Relative 10 %   Monocytes Absolute 0.4 0.1 - 1.0 K/uL   Eosinophils Relative 4 %   Eosinophils Absolute 0.2 0.0 - 0.7 K/uL   Basophils Relative 1 %   Basophils Absolute 0.1 0.0 - 0.1 K/uL  Comprehensive metabolic panel     Status: Abnormal   Collection Time: 01/29/15 12:25 PM  Result Value Ref Range   Sodium 142 135 - 145 mmol/L   Potassium 3.9 3.5 - 5.1 mmol/L   Chloride 107 101 - 111 mmol/L   CO2 27 22 - 32  mmol/L   Glucose, Bld 128 (H) 65 - 99 mg/dL   BUN 17 6 - 20 mg/dL   Creatinine, Ser 1.06 (H) 0.44 - 1.00 mg/dL   Calcium 10.3 8.9 - 10.3 mg/dL   Total Protein 7.0 6.5 - 8.1 g/dL   Albumin 3.7 3.5 - 5.0 g/dL   AST 25 15 - 41 U/L   ALT 20 14 - 54 U/L   Alkaline Phosphatase 67 38 - 126 U/L   Total Bilirubin 0.8 0.3 - 1.2 mg/dL   GFR calc non Af Amer 49 (L) >60 mL/min   GFR calc Af Amer 57 (L) >60 mL/min   Anion gap 8 5 - 15  Protime-INR  Status: Abnormal   Collection Time: 01/29/15 12:25 PM  Result Value Ref Range   Prothrombin Time 16.1 (H) 11.6 - 15.2 seconds   INR 1.28 0.00 - 1.49   Mr Brain Wo Contrast  01/28/2015  CLINICAL DATA:  75 year old hypertensive female with 3 day history of right-sided weakness and slurred speech. Subsequent encounter. EXAM: MRI HEAD WITHOUT CONTRAST TECHNIQUE: Multiplanar, multiecho pulse sequences of the brain and surrounding structures were obtained without intravenous contrast. COMPARISON:  01/25/2015. FINDINGS: Exam is motion degraded. 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 Mild to moderate chronic small vessel disease changes. Global atrophy. Ventricular prominence felt to be related to atrophy rather hydrocephalus. No intracranial mass lesion noted on this unenhanced exam. Major intracranial vascular structures are patent. Please see recent MR angiogram report. Ectatic right carotid terminus. 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 Transverse ligament hypertrophy. Exophthalmos. IMPRESSION: Exam is motion degraded. 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 Mild to moderate chronic small vessel disease changes. Global atrophy. Ventricular prominence felt to be related to atrophy rather hydrocephalus. No intracranial mass lesion noted on this unenhanced exam. Major intracranial vascular structures are patent. Please see recent MR angiogram report. Ectatic right carotid terminus. Spinal  stenosis C3-4 with slight cord flattening. Exophthalmos. Electronically Signed   By: Genia Del M.D.   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  Records reviewed and summated above. Stroke: Continue secondary stroke prophylaxis and Risk Factor Modification listed below:   Antiplatelet therapy:   Blood Pressure Management:  Continue current medication with prn's with permisive HTN per primary team Statin Agent:  Right sided hemiparesis: fit for orthotics to prevent contractures (resting hand splint for day, wrist cock up splint at night, PRAFO, etc) Motor recovery: Fluoxetine  1. Does the need for close, 24 hr/day medical supervision in concert with the patient's rehab needs make it unreasonable for this patient to be served in a less intensive setting? Potentially  2. Co-Morbidities requiring supervision/potential complications: sleep apnea (Evaluate signs of daytime somnolence, cont CPAP), HTN (monitor and provide prns in accordance with increased physical exertion and pain), primary hyperparathyroidism, chronic low back pain (Biofeedback training with therapies to help reduce reliance on opiate pain medications, monitor pain control during therapies, and sedation at rest and titrate to maximum efficacy to ensure participation and gains in therapies, follow up as outpt for ?surgery), combined CHF (Monitor in accordance with increased physical activity and avoid UE resistance excercises), PAF (cont meds) 3. Due to safety, skin/wound care, disease management, medication administration and patient education, does the patient require 24 hr/day rehab nursing? Yes 4. Does the patient require coordinated care of a physician, rehab nurse, PT (1-2 hrs/day, 5 days/week), OT (1-2 hrs/day, 5 days/week) and SLP (1-2 hrs/day, 5 days/week) to address physical and functional deficits in the context of  the above medical diagnosis(es)? Yes Addressing deficits in the following areas: balance, endurance, locomotion, strength, transferring, bathing, dressing, grooming, toileting, cognition, speech and psychosocial support 5. Can the patient actively participate in an intensive therapy program of at least 3 hrs of therapy per day at least 5 days per week? Yes 6. The potential for patient to make measurable gains while on inpatient rehab is excellent 7. Anticipated functional outcomes upon discharge from inpatient rehab are modified independent  with PT, modified independent with OT, modified independent and supervision with SLP. 8. Estimated rehab length of stay  to reach the above functional goals is: 13-16 days. 9. Does the patient have adequate social supports and living environment to accommodate these discharge functional goals? Potentially 10. Anticipated D/C setting: Other 11. Anticipated post D/C treatments: HH therapy and Home excercise program 12. Overall Rehab/Functional Prognosis: good  RECOMMENDATIONS: This patient's condition is appropriate for continued rehabilitative care in the following setting: Will need to clarify pt's baseline level of functioning and cognition as well as living arrangements.  If pt does not have significant cognitive deficits, would anticipate should would be able to achieve and independent level of functioning after CIR.  Will also need to clarify with cardiology if pt is able to tolerate intense rehab prior to ischemic evaluation.  Patient has agreed to participate in recommended program. Yes Note that insurance prior authorization may be required for reimbursement for recommended care.  Comment: Rehab Admissions Coordinator to follow up.  Delice Lesch, MD 01/30/2015

## 2015-01-30 NOTE — Progress Notes (Signed)
Inpatient Rehabilitation  I met with the patient at the bedside and spoke with her daughter Greta Doom,  [phone], over the phone to discuss pt's post acute rehab needs.  Pt. Lived alone at the [facility] and was driving per pt. and Sharee Pimple. Pt. will not have any supervision following a short rehab stay as both of her daughters work. Additionally, Sharee Pimple is concerned that pt. would not be able to tolerate the intensity of our CIR program and that a slower paced program is best for her.  Pt. has been to [facility] in the past and would like to receive her rehab there for a longer recovery period.  I have updated Jacqualin Combes, RNCM and Glendon Axe, CSW of family's request.  I will sign off.  Please call if questions.  [facility] Admissions Coordinator Cell [phone] Office [phone]

## 2015-01-31 DIAGNOSIS — E785 Hyperlipidemia, unspecified: Secondary | ICD-10-CM | POA: Diagnosis not present

## 2015-01-31 DIAGNOSIS — R600 Localized edema: Secondary | ICD-10-CM | POA: Diagnosis not present

## 2015-01-31 DIAGNOSIS — I110 Hypertensive heart disease with heart failure: Secondary | ICD-10-CM | POA: Diagnosis not present

## 2015-01-31 DIAGNOSIS — G8929 Other chronic pain: Secondary | ICD-10-CM | POA: Diagnosis not present

## 2015-01-31 DIAGNOSIS — R5381 Other malaise: Secondary | ICD-10-CM | POA: Diagnosis not present

## 2015-01-31 DIAGNOSIS — M545 Low back pain: Secondary | ICD-10-CM | POA: Diagnosis not present

## 2015-01-31 DIAGNOSIS — I69322 Dysarthria following cerebral infarction: Secondary | ICD-10-CM | POA: Diagnosis not present

## 2015-01-31 DIAGNOSIS — R278 Other lack of coordination: Secondary | ICD-10-CM | POA: Diagnosis not present

## 2015-01-31 DIAGNOSIS — R41841 Cognitive communication deficit: Secondary | ICD-10-CM | POA: Diagnosis not present

## 2015-01-31 DIAGNOSIS — I5042 Chronic combined systolic (congestive) and diastolic (congestive) heart failure: Secondary | ICD-10-CM | POA: Diagnosis not present

## 2015-01-31 DIAGNOSIS — I69311 Memory deficit following cerebral infarction: Secondary | ICD-10-CM | POA: Diagnosis not present

## 2015-01-31 DIAGNOSIS — I420 Dilated cardiomyopathy: Secondary | ICD-10-CM | POA: Diagnosis not present

## 2015-01-31 DIAGNOSIS — I480 Paroxysmal atrial fibrillation: Secondary | ICD-10-CM | POA: Diagnosis not present

## 2015-01-31 DIAGNOSIS — G4733 Obstructive sleep apnea (adult) (pediatric): Secondary | ICD-10-CM | POA: Diagnosis not present

## 2015-01-31 DIAGNOSIS — I69351 Hemiplegia and hemiparesis following cerebral infarction affecting right dominant side: Secondary | ICD-10-CM | POA: Diagnosis not present

## 2015-01-31 DIAGNOSIS — F329 Major depressive disorder, single episode, unspecified: Secondary | ICD-10-CM | POA: Diagnosis not present

## 2015-01-31 DIAGNOSIS — I69359 Hemiplegia and hemiparesis following cerebral infarction affecting unspecified side: Secondary | ICD-10-CM | POA: Diagnosis not present

## 2015-01-31 DIAGNOSIS — M6281 Muscle weakness (generalized): Secondary | ICD-10-CM | POA: Diagnosis not present

## 2015-01-31 DIAGNOSIS — I10 Essential (primary) hypertension: Secondary | ICD-10-CM | POA: Diagnosis not present

## 2015-01-31 DIAGNOSIS — Z8673 Personal history of transient ischemic attack (TIA), and cerebral infarction without residual deficits: Secondary | ICD-10-CM | POA: Diagnosis not present

## 2015-01-31 DIAGNOSIS — I639 Cerebral infarction, unspecified: Secondary | ICD-10-CM | POA: Diagnosis not present

## 2015-01-31 DIAGNOSIS — K219 Gastro-esophageal reflux disease without esophagitis: Secondary | ICD-10-CM | POA: Diagnosis not present

## 2015-01-31 DIAGNOSIS — R05 Cough: Secondary | ICD-10-CM | POA: Diagnosis not present

## 2015-01-31 NOTE — Clinical Social Work Note (Signed)
[employer] authorization obtained [id number].  Clinical Social Worker will sign off for now as social work intervention is no longer needed. Please consult [place] again if new need arises.  Glendon Axe, MSW, LCSWA [phone] 01/31/2015 3:44 PM

## 2015-01-31 NOTE — Clinical Social Work Placement (Signed)
   CLINICAL SOCIAL WORK PLACEMENT  NOTE  Date:  01/31/2015  Patient Details  Name: Susan [name] MRN: [id number] Date of Birth: Mar 11, 1940  Clinical Social Work is seeking post-discharge placement for this patient at the [place] level of care (*CSW will initial, date and re-position this form in  chart as items are completed):  Yes   Patient/family provided with [facility] Work Department's list of facilities offering this level of care within the geographic area requested by the patient (or if unable, by the patient's family).  Yes   Patient/family informed of their freedom to choose among providers that offer the needed level of care, that participate in Medicare, Medicaid or managed care program needed by the patient, have an available bed and are willing to accept the patient.  Yes   Patient/family informed of [facility]'s ownership interest in [facility] and [facility], as well as of the fact that they are under no obligation to receive care at these facilities.  PASRR submitted to EDS on 01/31/15     PASRR number received on 01/31/15     Existing PASRR number confirmed on       FL2 transmitted to all facilities in geographic area requested by pt/family on 01/31/15     FL2 transmitted to all facilities within larger geographic area on       Patient informed that his/her managed care company has contracts with or will negotiate with certain facilities, including the following:        Yes   Patient/family informed of bed offers received.  Patient chooses bed at  [facility] )     Physician recommends and patient chooses bed at      Patient to be transferred to  [facility] and [facility] ) on 01/31/15.  Patient to be transferred to facility by       Patient family notified on 01/31/15 of transfer.  Name of family member notified:   (Pt's dtr, Sharee Pimple)     PHYSICIAN Please sign FL2     Additional Comment:     _______________________________________________ Rozell Searing, LCSW 01/31/2015, 3:03 PM

## 2015-01-31 NOTE — Discharge Summary (Signed)
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 Follow-up with PCP and cardiology on discharge 2. Monitor your weight daily and use low salt heart healthy diet  Follow-up Information    Follow up with [name], MD. Schedule an appointment as soon as possible for a visit in 1 week.   Specialty:  Family Medicine   Contact information:   [address] [place] [address] [phone]      Diet recommendation: Heart healthy diet  Activity: The patient is advised to gradually reintroduce usual activities.  Discharge Condition: good  History of present illness: As per the H and P dictated on admission, "she was recently admitted in the hospital and discharged after workup for TIA. She was found to having intermittent fibrillation and discharged on eye liquids. PT recommended home health. She comes back with worsening symptoms of right-sided weakness and speech slurring"  Hospital Course:  Summary of her active problems in the hospital is as following. 1. Acute ischemic stroke [facility]) Recently presented for TIA, due to diagnosis of atrial fibrillation patient was discharged on Apixaban. Patient had worsening symptoms of right-sided weakness as well as facial droop after discharge and  comes back to the hospital with positive MRI for acute left ICA infarct. Neurologically she remains stable. Continue apixaban, Pravachol, Zetia. CIR consult appreciated. Family has decided to go for SNF placement.  2. Leg tenderness. Negative duplex for DVT, continue lasix and add teds.  3. Chronic combined CHF. Ejection fraction 30-35%. Suspected ischemic etiology. [place] cardiology consultation. Coreg increased to 6.25 mg twice a day, continue 20 mg Lasix, repeat echocardiogram in 3 month, will need further workup depending on the repeat echocardiogram. And comfortably go for aggressive inpatient rehabilitation  4. Peripheral neuropathy. Continuing gabapentin.  5. Obstructive sleep apnea. Patient was not wearing C Pap. Earlier Sleep study showed severe OSA. Recommended patient to remain compliant with sleep apnea. CPap ordered. 10 cm.  6. Essential hypertension. Blood pressure stable resuming home medications.  All other chronic medical condition were stable during the hospitalization.  Patient was seen by physical therapy, who recommended CIR but the patient decided to go for SNF, which was arranged by social worker and case Freight forwarder. On the day of the discharge the patient's vitals remained stable, and no other acute medical condition were reported by patient. the patient was felt safe to be discharge at [facility].  Procedures and Results:  Lower Extremity Doppler negative for DVT  Consultations:  Cardiology, inpatient rehabilitation, neurology  DISCHARGE MEDICATION: Current Discharge Medication List    CONTINUE these medications which have NOT CHANGED   Details  apixaban (ELIQUIS) 5 MG TABS tablet Take 1 tablet (5 mg total) by mouth every 12 (twelve) hours. Qty: 60 tablet, Refills: 4    buPROPion (WELLBUTRIN XL) 300 MG 24 hr tablet  Take 300 mg by mouth daily. Refills: 12    carvedilol (COREG) 6.25 MG tablet Take 0.5 tablets (3.125 mg total) by mouth 2 (two) times  daily with a meal. Qty: 60 tablet, Refills: 3    cholecalciferol (VITAMIN D) 1000 units tablet Take 1,000 Units by mouth daily.    ezetimibe (ZETIA) 10 MG tablet Take 1 tablet (10 mg total) by mouth daily. Qty: 30 tablet, Refills: 3    furosemide (LASIX) 20 MG tablet Take 20 mg by mouth daily. Refills: 2    gabapentin (NEURONTIN) 300 MG capsule Take 1 capsule (300 mg total) by mouth at bedtime. Qty: 30 capsule, Refills: 3    Multiple Vitamin (MULTIVITAMIN WITH MINERALS) TABS Take 1 tablet by mouth daily.    pravastatin (PRAVACHOL) 20 MG tablet Take 20 mg by mouth every morning.    valsartan (DIOVAN) 320 MG tablet TAKE 1 TABLET BY MOUTH EVERY DAY Qty: 30 tablet, Refills: 11    vitamin B-12 (CYANOCOBALAMIN) 100 MCG tablet Take 100 mcg by mouth daily.      STOP taking these medications     aspirin EC 81 MG tablet        Allergies  Allergen Reactions  . Lipitor [Atorvastatin] Swelling  . Simvastatin Other (See Comments)    Myalgias     . Ace Inhibitors Cough   Discharge Instructions    Diet - low sodium heart healthy    Complete by:  As directed      Discharge instructions    Complete by:  As directed   It is important that you read following instructions as well as go over your medication list with RN to help you understand your care after this hospitalization.  Discharge Instructions: Follow-up with PCP as well as cartilage in 1 week For Heart failure patients -  Check your Weight same time everyday If you gain over 2 pounds, or you develop in leg swelling, experience more shortness of breath or chest pain, call your Primary MD immediately.  Avoid adding extra salt in the food, food high in salt and Follow 1.5 lit/day fluid restriction.    Please request your primary care physician to go over all Hospital Tests and Procedure/Radiological results at the follow up,  Please get all Hospital records sent to your PCP by signing hospital release before you go home.    You were cared for by a hospitalist during your hospital stay. If you have any questions about your discharge medications or the care you received while you were in the hospital after you are discharged, you can call the unit and ask to speak with the hospitalist on call if the hospitalist that took care of you is not available.  Once you are discharged, your primary care physician will handle any further medical issues. Please note that NO REFILLS for any discharge medications will be authorized once you are discharged, as it is imperative that you return to your primary care physician (or establish a relationship with a primary care physician if you do not have one) for your aftercare needs so that they can reassess your need for medications and monitor your lab values. You Must read complete instructions/literature along with all the possible adverse reactions/side effects for all the Medicines you take and that have been prescribed to you. Take any new Medicines after you have completely understood and accept all the possible adverse reactions/side effects. Wear Seat belts while driving.     Increase activity slowly  Complete by:  As directed           Discharge Exam: Filed Weights   01/28/15 1045 01/28/15 2156  Weight: 92.08 kg (203 lb) 84.868 kg (187 lb 1.6 oz)   Filed Vitals:   01/31/15 0811 01/31/15 0942  BP: 158/75 136/58  Pulse: 64 66  Temp: 98.5 F (36.9 C) 98.6 F (37 C)  Resp: 17 18   General: Appear in no distress, no Rash; Oral Mucosa moist. Cardiovascular: S1 and S2 Present, no Murmur, no JVD Respiratory: Bilateral Air entry present and Clear to Auscultation, no Crackles, no wheezes Abdomen: Bowel Sound present, Soft and no tenderness Extremities: no Pedal edema, no calf tenderness Neurology: Improving right-sided weakness as well as right facial droop.  The results of significant diagnostics from this hospitalization (including imaging, microbiology, ancillary  and laboratory) are listed below for reference.    Significant Diagnostic Studies: Dg Chest 2 View  01/25/2015  CLINICAL DATA:  Generalized weakness. Recent transient ischemic attack EXAM: CHEST  2 VIEW COMPARISON:  Chest radiograph March 05, 2013; chest CT March 05, 2013 FINDINGS: Mild generalized interstitial prominence is stable. There is no frank edema or consolidation. There is slight cardiomegaly with slight pulmonary venous hypertension. No adenopathy apparent. There is degenerative change in the thoracic spine. IMPRESSION: Findings consistent with a degree of pulmonary vascular congestion without frank edema or consolidation. No change in cardiac silhouette. Electronically Signed   By: Lowella Grip III M.D.   On: 01/25/2015 07:58   Ct Head Wo Contrast  01/25/2015  CLINICAL DATA:  TIA status post tPA. EXAM: CT HEAD WITHOUT CONTRAST TECHNIQUE: Contiguous axial images were obtained from the base of the skull through the vertex without intravenous contrast. COMPARISON:  01/24/2015 head CT. FINDINGS: No evidence of parenchymal hemorrhage or extra-axial fluid collection. No mass lesion, mass effect, or midline shift. No CT evidence of acute infarction. Mild intracranial atherosclerosis. Nonspecific stable subcortical and periventricular white matter hypodensity, most in keeping with chronic small vessel ischemic change. Mild diffuse cerebral volume loss. No ventriculomegaly. The visualized paranasal sinuses are essentially clear. The mastoid air cells are unopacified. No evidence of calvarial fracture. IMPRESSION: 1.  No evidence of acute intracranial abnormality. 2. Mild cerebral volume loss and mild chronic small vessel ischemic white matter change. Electronically Signed   By: Ilona Sorrel M.D.   On: 01/25/2015 08:01   Ct Head Wo Contrast  01/24/2015  ADDENDUM REPORT: 01/24/2015 13:55 ADDENDUM: Critical Value/emergent results were called by telephone at the time of interpretation on  01/24/2015 at 1:54 pm to Dr. Aram Beecham, neurology , who verbally acknowledged these results. Electronically Signed   By: Lowella Grip III M.D.   On: 01/24/2015 13:55  01/24/2015  CLINICAL DATA:  Acute onset right-sided weakness and facial droop EXAM: CT HEAD WITHOUT CONTRAST TECHNIQUE: Contiguous axial images were obtained from the base of the skull through the vertex without intravenous contrast. COMPARISON:  April 24, 2011 FINDINGS: Mild diffuse atrophy is stable. There is no intracranial mass, hemorrhage, extra-axial fluid collection, or midline shift. There is mild small vessel disease in the centra semiovale bilaterally. Elsewhere gray-white compartments appear normal. No acute infarct identified. Middle cerebral artery attenuation is symmetric and normal bilaterally. Bony calvarium appears intact. Visualized mastoid air cells are clear. There is opacification in several ethmoid air cells. No intraorbital lesions are appreciable. IMPRESSION: Stable atrophy with patchy periventricular small vessel disease. No intracranial mass, hemorrhage, or acute appearing infarct. There is opacification of several left-sided ethmoid air  cells. Electronically Signed: By: Lowella Grip III M.D. On: 01/24/2015 13:52   Mr Brain Wo Contrast  01/28/2015  CLINICAL DATA:  75 year old hypertensive female with 3 day history of right-sided weakness and slurred speech. Subsequent encounter. EXAM: MRI HEAD WITHOUT CONTRAST TECHNIQUE: Multiplanar, multiecho pulse sequences of the brain and surrounding structures were obtained without intravenous contrast. COMPARISON:  01/25/2015. FINDINGS: Exam is motion degraded. 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 Mild to moderate chronic small vessel disease changes. Global atrophy. Ventricular prominence felt to be related to atrophy rather hydrocephalus. No intracranial mass lesion noted on this unenhanced exam. Major intracranial vascular structures are  patent. Please see recent MR angiogram report. Ectatic right carotid terminus. 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 Transverse ligament hypertrophy. Exophthalmos. IMPRESSION: Exam is motion degraded. 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 Mild to moderate chronic small vessel disease changes. Global atrophy. Ventricular prominence felt to be related to atrophy rather hydrocephalus. No intracranial mass lesion noted on this unenhanced exam. Major intracranial vascular structures are patent. Please see recent MR angiogram report. Ectatic right carotid terminus. 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 Exophthalmos. Electronically Signed   By: Genia Del M.D.   On: 01/28/2015 13:47   Mr Brain Wo Contrast  01/25/2015  CLINICAL DATA:  Right-sided weakness and slurred speech at primary care office today. Symptoms nearly resolved at time of presentation. EXAM: MRI HEAD WITHOUT CONTRAST MRA HEAD WITHOUT CONTRAST TECHNIQUE: Multiplanar, multiecho pulse sequences of the brain and surrounding structures were obtained without intravenous contrast. Angiographic images of the head were obtained using MRA technique without contrast. COMPARISON:  None. FINDINGS: MRI HEAD FINDINGS Calvarium and upper cervical spine: Cervical spine degeneration without visible cord compression. No focal marrow signal abnormality. Orbits: Negative. Sinuses and Mastoids: Clear. Brain: Diffusion signal in the left cortical spinal tract is roughly symmetric, especially on coronal DWI acquisition. There is no convincing acute infarct. No hemorrhage, hydrocephalus, or major vessel occlusion. No evidence of mass lesion. Generalized cerebral volume loss which is age congruent, as is small-vessel ischemic change in the periventricular white matter. 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 MRA HEAD FINDINGS Small left carotid artery  in the setting of aplastic left A1 segment and large right posterior communicating artery. Fairly symmetric vertebral arteries with dominant right AICA and left PICA. Isolated left MCA show superimposed high-grade narrowing with flow gap in the inferior and superior division branches at the M1-M2 junction. Given clustered appearance and proximity to imaging slab artifact is considered, but there is no artifact elsewhere on the scan at this level. Left MCA branches have an attenuated appearance compared to the right from slow or underfilling. There is also multifocal narrowing of bilateral medium-sized arteries, worse in the anterior circulation, consistent with atherosclerosis. Some of the stenoses are high-grade. There is no dilatation/beading beyond the stenoses. In the posterior circulation, beginning at the P2 segments there is bilateral intermittent atherosclerotic type narrowing which is most proximal and high-grade at the left P2-P3 junction. A 1-2 mm bulge medially from the anterior genu of the right carotid siphon could be atherosclerotic, infundibular, or aneurysmal. IMPRESSION: 1. Negative for acute infarct. 2. Extensive intracranial atherosclerosis. High-grade left M1 -M2 branch stenosis is the most proximal and pertinent to the reported deficits. Please see further description above. Electronically Signed   By: Monte Fantasia M.D.   On: 01/25/2015 07:14   Mr Jodene Nam  Head/brain Wo Cm  01/25/2015  CLINICAL DATA:  Right-sided weakness and slurred speech at primary care office today. Symptoms nearly resolved at time of presentation. EXAM: MRI HEAD WITHOUT CONTRAST MRA HEAD WITHOUT CONTRAST TECHNIQUE: Multiplanar, multiecho pulse sequences of the brain and surrounding structures were obtained without intravenous contrast. Angiographic images of the head were obtained using MRA technique without contrast. COMPARISON:  None. FINDINGS: MRI HEAD FINDINGS Calvarium and upper cervical spine: Cervical spine  degeneration without visible cord compression. No focal marrow signal abnormality. Orbits: Negative. Sinuses and Mastoids: Clear. Brain: Diffusion signal in the left cortical spinal tract is roughly symmetric, especially on coronal DWI acquisition. There is no convincing acute infarct. No hemorrhage, hydrocephalus, or major vessel occlusion. No evidence of mass lesion. Generalized cerebral volume loss which is age congruent, as is small-vessel ischemic change in the periventricular white matter. 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 Fairly symmetric vertebral arteries with dominant right AICA and left PICA. Isolated left MCA show superimposed high-grade narrowing with flow gap in the inferior and superior division branches at the M1-M2 junction. Given clustered appearance and proximity to imaging slab artifact is considered, but there is no artifact elsewhere on the scan at this level. Left MCA branches have an attenuated appearance compared to the right from slow or underfilling. There is also multifocal narrowing of bilateral medium-sized arteries, worse in the anterior circulation, consistent with atherosclerosis. Some of the stenoses are high-grade. There is no dilatation/beading beyond the stenoses. In the posterior circulation, beginning at the P2 segments there is bilateral intermittent atherosclerotic type narrowing which is most proximal and high-grade at the left P2-P3 junction. A 1-2 mm bulge medially from the anterior genu of the right carotid siphon could be atherosclerotic, infundibular, or aneurysmal. IMPRESSION: 1. Negative for acute infarct. 2. Extensive intracranial atherosclerosis. High-grade left M1 -M2 branch stenosis is the most proximal and pertinent to the reported deficits. Please see  further description above. Electronically Signed   By: Monte Fantasia M.D.   On: 01/25/2015 07:14    Microbiology: No results found for this or any previous visit (from the past 240 hour(s)).   Labs: CBC:  Recent Labs Lab 01/24/15 1337 01/24/15 1343 01/28/15 1222 01/28/15 1231 01/29/15 1225 01/30/15 0730  WBC 3.9*  --  3.3*  --  3.9* 4.3  NEUTROABS 2.3  --  1.7  --  2.3 2.3  HGB 13.5 13.9 13.2 14.3 13.4 13.3  HCT 39.3 41.0 39.1 42.0 40.4 39.8  MCV 92.7  --  94.4  --  95.1 94.5  PLT 217  --  218  --  244 [id number]   Basic Metabolic Panel:  Recent Labs Lab 01/24/15 1337 01/24/15 1343 01/28/15 1222 01/28/15 1231 01/29/15 1225 01/30/15 0730  NA 144 143 142 143 142 141  K 3.8 3.7 4.5 4.5 3.9 4.0  CL 107 106 107 105 107 106  CO2 25  --  25  --  27 28  GLUCOSE 92 91 92 92 128* 102*  BUN 17 19 17  25* 17 17  CREATININE 1.05* 1.00 1.12* 1.10* 1.06* 1.09*  CALCIUM 10.6*  --  10.1  --  10.3 10.1   Liver Function Tests:  Recent Labs Lab 01/24/15 1337 01/28/15 1222 01/29/15 1225 01/30/15 0730  AST 26 29 25 21   ALT 19  20 20 18   ALKPHOS 70 62 67 63  BILITOT 0.5 1.2 0.8 0.8  PROT 6.7 6.7 7.0 6.5  ALBUMIN 3.9 3.9 3.7 3.6   Time spent: 30 minutes  Signed:  PATEL, PRANAV  [facility] 01/31/2015, 10:36 AM

## 2015-01-31 NOTE — Clinical Social Work Note (Signed)
[place] MUST PASARR confirmed: [id number] Rozell Searing, MSW, LCSWA [phone] 01/31/2015 3:15 PM

## 2015-01-31 NOTE — Progress Notes (Signed)
Checked on pt. Pt alert and oriented x4. Respirations even and unlabored, bilateral symmetrical rise and fall of chest. Skin warm and dry. In no acute distress. Provided a pencil per pt request.

## 2015-01-31 NOTE — NC FL2 (Signed)
[place] LEVEL OF CARE SCREENING TOOL     IDENTIFICATION  Patient Name: Susan [name] Birthdate: August 24, 1940 Sex: female Admission Date (Current Location): 01/28/2015  [place] and [place] Number:  Kathleen Argue  ([place]) Facility and Address:  [facility] [place] [address] [place], [place] [address]      Provider Number: [id number]  Attending Physician Name and Address:  Lavina Hamman, MD  Relative Name and Phone Number:   Sharee Pimple [place]-[phone])    Current Level of Care: Hospital Recommended Level of Care: [place] Prior Approval Number:    Date Approved/Denied:   PASRR Number:    Discharge Plan: SNF    Current Diagnoses: Patient Active Problem List   Diagnosis Date Noted  . 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 ([place])   . 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 Acute ischemic stroke (Left internal capsule) 01/28/2015  . Chronic low back pain 01/28/2015  . Short-term memory loss 01/28/2015  . Volume depletion 01/28/2015  . Paroxysmal atrial fibrillation ([place]) 01/26/2015  . DCM (dilated cardiomyopathy) ([place]) 01/26/2015  . TIA (transient ischemic attack) 01/24/2015  . 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 Obesity (BMI 30-39.9) 01/27/2013  . Constipation 09/02/2012  . H/O arthroscopy of right knee 08/06/2012  . Anxiety   . Depression   . GERD (gastroesophageal reflux disease)   . Hypertension   . OSA (obstructive sleep apnea)   . Osteoarthritis of right knee   . Hyperparathyroidism, primary ([place]) 04/22/2012  . Chronic cough 10/02/2011    Orientation RESPIRATION BLADDER Height & Weight    Time, Self, Situation, Place  Normal Continent 5\' 5"  (165.1 cm) 187 lbs.  BEHAVIORAL SYMPTOMS/MOOD NEUROLOGICAL BOWEL NUTRITION STATUS      Continent Diet (HEART HEALTHY)  AMBULATORY STATUS  COMMUNICATION OF NEEDS Skin   Limited Assist   Normal                       Personal Care Assistance Level of Assistance  Dressing, Bathing, Feeding Bathing Assistance: Limited assistance Feeding assistance: Independent Dressing Assistance: Limited assistance     Functional Limitations Info  Speech     Speech Info: Impaired    SPECIAL CARE FACTORS FREQUENCY  Speech therapy, PT (By licensed PT), OT (By licensed OT)     PT Frequency:  (4x/wk) OT Frequency:  (3x/week)            Contractures      Additional Factors Info  Code Status, Allergies Code Status Info:  (FULL) Allergies Info:  (Lipitor, Simvastatin, Ace Inhibitors)           Current Medications (01/31/2015):  This is the current hospital active medication list Current Facility-Administered Medications  Medication Dose Route Frequency Provider Last Rate Last Dose  . apixaban (ELIQUIS) tablet 5 mg  5 mg Oral Q12H Samella Parr, NP   5 mg at 01/30/15 2232  . buPROPion (WELLBUTRIN XL) 24 hr tablet 300 mg  300 mg Oral Daily Samella Parr, NP   300 mg at 01/30/15 0909  . carvedilol (COREG) tablet 6.25 mg  6.25 mg Oral BID WC Jerline Pain, MD   6.25 mg at 01/31/15 0811  . cholecalciferol (VITAMIN D) tablet 1,000 Units  1,000 Units Oral Daily Samella Parr, NP   1,000 Units at 01/30/15 0908  . docusate sodium (COLACE) capsule  100 mg  100 mg Oral Daily Lavina Hamman, MD   100 mg at 01/30/15 0908  . ezetimibe (ZETIA) tablet 10 mg  10 mg Oral Daily Samella Parr, NP   10 mg at 01/30/15 0909  . furosemide (LASIX) tablet 20 mg  20 mg Oral Daily Lavina Hamman, MD   20 mg at 01/30/15 0908  . gabapentin (NEURONTIN) capsule 300 mg  300 mg Oral QHS Samella Parr, NP   300 mg at 01/30/15 2232  . irbesartan (AVAPRO) tablet 300 mg  300 mg Oral Daily Samella Parr, NP   300 mg at 01/30/15 0909  . multivitamin with minerals tablet 1 tablet  1 tablet Oral Daily Samella Parr, NP   1 tablet at 01/30/15 0909  .  pravastatin (PRAVACHOL) tablet 20 mg  20 mg Oral q1800 Samella Parr, NP   20 mg at 01/30/15 1820  . vitamin B-12 (CYANOCOBALAMIN) tablet 100 mcg  100 mcg Oral Daily Samella Parr, NP   100 mcg at 01/30/15 0908     Discharge Medications: Please see discharge summary for a list of discharge medications.  Relevant Imaging Results:  Relevant Lab Results:   Additional Information  (SSN:[id number])  Leane Call, Student-SW [phone]

## 2015-01-31 NOTE — Progress Notes (Signed)
Hourly rounding performed. Call light within reach. Pt in no acute distress. Denies needs. RN helped pt adjust clothing.

## 2015-01-31 NOTE — Progress Notes (Signed)
Cardiology Office Note:    Date:  02/01/2015   ID:  Susan [name], DOB 1940-05-20, MRN [id number]  PCP:  [name], MD  Cardiologist:  Dr. Fransico Him   Electrophysiologist:  n/a  Chief Complaint  Patient presents with  . Hospitalization Follow-up    History of Present Illness:    Susan [name] is a 75 y.o. female with a hx of OSA, HTN, HFpEF, HL.  She is intolerant of statins. Last seen by Dr. Fransico Him 01/19/15.  She presented to the office on 1/17 to get a 24 hour ambulatory BP monitor placed.  However, she demonstrated signs of a stroke with R sided weakness, slurred speech and unsteady gait.  She was sent to the hospital. She was admitted 1/17-1/19.  Symptoms resolved upon presentation and she was evaluated for TIA.  MRI was neg for acute CVA and MRA demonstrated extensive intracranial atherosclerosis, high-grade L M1, M2 branch stenosis.  Echo demonstrated EF 30-35%.  Carotid US demonstrated 1-39% ICA stenosis.  She was seen by neurology and cardiology.  DCM was felt to be related to HTN and aggressive BP control was recommended.  OP Nuc stress test would need to be arranged (if neg for ischemia >> repeat echo in 3 mos to reassess EF).  PAF was noted on Tele.  CHADS2-VASc=5 (female, HTN, CHF, stroke, age) and Eliquis 5 mg bid was recommended.  ASA was DC'd.   Readmitted 1/21-1/24 (DC yesterday) with acute L brain CVA.  MRI demonstrated small acute non-hemorrhagic infarct posterior limb L internal capsule.  She was seen by cardiology again (Dr. Marlou Porch).  It was felt that cardiac cath should be avoided given her recent CVA.  Repeat echo was again recommended in 3 mos.  She was DC to SNF [facility]).    Returns for FU.  She is here today with her daughter. She denies chest pain or significant dyspnea. She just started physical therapy today. She denies orthopnea or PND. LE edema is unchanged. She has residual right-sided weakness. She denies syncope. She does note a  cough. This is nonproductive. She denies fever.   Past Medical History  Diagnosis Date  . Hyperlipidemia   . Pedal edema   . Spinal stenosis   . Varicose veins     "BLE" (07/29/2012)  . Primary hyperparathyroidism ([place])   . Thyroid disease   . Incontinent of urine     wears pads  . Family history of anesthesia complication     "some wake up during OR; some are hard to wake up; some both" (07/29/2012)  . OSA on CPAP   . Arthritis     "legs, back" (07/29/2012)  . Osteoarthritis of right knee   . Umbilical hernia     "unrepaired" (07/29/2012)  . Chronic lower back pain   . History of stomach ulcers 1980's  . Anxiety   . Depression   . GERD (gastroesophageal reflux disease)   . Hypertension   . Chronic diastolic CHF (congestive heart failure) ([place])   . Complication of anesthesia     "I have apnea" (07/29/2012)  . Dementia     per family    Past Surgical History  Procedure Laterality Date  . Iud removal  1980's  . Parathyroidectomy N/A 06/16/2012    Procedure: NECK EXPLORATION AND LEFT SUPERIOR PARATHYROIDECTOMY;  Surgeon: Earnstine Regal, MD;  Location: [facility];  Service: General;  Laterality: N/A;  . Colonoscopy    . 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  Cholecystectomy  ~ 2010  . Total knee arthroplasty Right 07/29/2012    Procedure: TOTAL KNEE ARTHROPLASTY;  Surgeon: Ninetta Lights, MD;  Location: [place];  Service: Orthopedics;  Laterality: Right;    Current Medications: Outpatient Prescriptions Prior to Visit  Medication Sig Dispense Refill  . apixaban (ELIQUIS) 5 MG TABS tablet Take 1 tablet (5 mg total) by mouth every 12 (twelve) hours. 60 tablet 4  . buPROPion (WELLBUTRIN XL) 300 MG 24 hr tablet Take 300 mg by mouth daily.  12  . cholecalciferol (VITAMIN D) 1000 units tablet Take 1,000 Units by mouth daily.    Marland Kitchen ezetimibe (ZETIA) 10 MG tablet Take 1 tablet (10 mg total) by mouth daily. 30 tablet 3  . furosemide (LASIX) 20 MG tablet Take 20 mg by mouth daily.  2  .  gabapentin (NEURONTIN) 300 MG capsule Take 1 capsule (300 mg total) by mouth at bedtime. 30 capsule 3  . Multiple Vitamin (MULTIVITAMIN WITH MINERALS) TABS Take 1 tablet by mouth daily.    . pravastatin (PRAVACHOL) 20 MG tablet Take 20 mg by mouth every morning.    . valsartan (DIOVAN) 320 MG tablet TAKE 1 TABLET BY MOUTH EVERY DAY 30 tablet 11  . vitamin B-12 (CYANOCOBALAMIN) 100 MCG tablet Take 100 mcg by mouth daily.    . carvedilol (COREG) 6.25 MG tablet Take 0.5 tablets (3.125 mg total) by mouth 2 (two) times daily with a meal. (Patient not taking: Reported on 02/01/2015) 60 tablet 3  . apixaban (ELIQUIS) tablet 5 mg     . ezetimibe (ZETIA) tablet 10 mg     . gabapentin (NEURONTIN) capsule 300 mg     . pravastatin (PRAVACHOL) tablet 20 mg      No facility-administered medications prior to visit.     Allergies:   Lipitor; Simvastatin; and Ace inhibitors   Social History   Social History  . Marital Status: Widowed    Spouse Name: N/A  . Number of Children: N/A  . Years of Education: N/A   Occupational History  . retired    Social History Main Topics  . Smoking status: Former Smoker -- 1.00 packs/day for 30 years    Types: Cigarettes    Quit date: 01/07/1978  . Smokeless tobacco: Never Used  . Alcohol Use: No  . Drug Use: No  . Sexual Activity: No   Other Topics Concern  . None   Social History Narrative     Family History:  The patient's family history includes Hypertension in her mother; Lung cancer in her father; Stroke in her mother.   ROS:   Please see the history of present illness.    ROS All other systems reviewed and are negative.   Physical Exam:    VS:  BP 123/56 mmHg  Pulse 60  Ht 5\' 5"  (1.651 m)  Wt 186 lb (84.369 kg)  BMI 30.95 kg/m2   GEN: Well nourished, well developed, in no acute distress HEENT: normal Neck: no JVD, no masses Cardiac: Normal S1/S2, RRR; no murmurs   Respiratory:  clear to auscultation bilaterally; no wheezing, rhonchi or  rales GI: soft, nontender, nondistended MS: no deformity or atrophy Skin: warm and dry, no rash Neuro: no focal deficits  Psych: Alert and oriented x 3, normal affect  Wt Readings from Last 3 Encounters:  02/01/15 186 lb (84.369 kg)  01/28/15 187 lb 1.6 oz (84.868 kg)  01/24/15 203 lb 14.8 oz (92.5 kg)      Studies/Labs Reviewed:  EKG:  EKG is  ordered today.  The ekg ordered today demonstrates NSR, HR 60, LAD, LVH with repol abnormality, similar to prior tracings.  Recent Labs: 01/30/2015: ALT 18; BUN 17; Creatinine, Ser 1.09*; Hemoglobin 13.3; Platelets 216; Potassium 4.0; Sodium 141   Recent Lipid Panel    Component Value Date/Time   CHOL 204* 01/25/2015 0408   TRIG 156* 01/25/2015 0408   HDL 51 01/25/2015 0408   CHOLHDL 4.0 01/25/2015 0408   VLDL 31 01/25/2015 0408   LDLCALC 122* 01/25/2015 0408    Additional studies/ records that were reviewed today include:   Mr Brain Wo Contrast   01/28/2015   IMPRESSION: Exam is motion degraded. 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 Mild to moderate chronic small vessel disease changes. Global atrophy. Ventricular prominence felt to be related to atrophy rather hydrocephalus. No intracranial mass lesion noted on this unenhanced exam. Major intracranial vascular structures are patent. Please see recent MR angiogram report. Ectatic right carotid terminus. 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 Exophthalmos. Electronically Signed   By: Genia Del M.D.   On: 01/28/2015 13:47    Carotid US 01/26/15 RICA 1-39%  Echo 01/25/15 Moderate LVH, EF 30-35%, diffuse HK, grade 1 diastolic dysfunction, MAC, severe LAE, PASP 38 mmHg  Myoview 4/15 No ischemia, EF 53%, normal study  Echo 4/15 EF 50-55%, mild MR, moderate LAE   ASSESSMENT:    1. 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 DCM (dilated cardiomyopathy) ([place])   3. History of CVA (cerebrovascular accident)    4. Paroxysmal atrial fibrillation ([facility])   5. Hypertensive heart disease with heart failure ([place])   6. OSA (obstructive sleep apnea)     PLAN:    In order of problems listed above:  1. Chronic combined systolic and diastolic CHF - Overall, her volume appears to be stable. Function status difficult to assess given her recent stroke and deconditioning. She has a recent cough. Lungs are clear. I have asked her to follow-up with the attending provider at the SNF. If her breathing is worse or she notices increased swelling, we can increase her Lasix.  2. Dilated cardiomyopathy - Likely related to uncontrolled hypertension. Plan will be to repeat her echocardiogram in approximately 3 months. If her EF does not improve with good blood pressure control, consider proceeding with cardiac catheterization. Continue beta blocker, ARB.    3. History of CVA - She was just discharged yesterday after presenting with left brain CVA. She has residual right-sided weakness. She's working with PT, OT and speech therapy at SNF.  Continue Eliquis, statin.  4. PAF - She has a high risk of stroke. She is now on Eliquis. Currently maintaining sinus rhythm.  5. Hypertensive heart disease  - Blood pressure well controlled. Continue to monitor. At follow-up, consider adding spironolactone or hydralazine/nitrates.   6. OSA - Continue CPAP.   Medication Adjustments/Labs and Tests Ordered: Current medicines are reviewed at length with the patient today.  Concerns regarding medicines are outlined above.  Medication changes, Labs and Tests ordered today are outlined in the Patient Instructions noted below. Patient Instructions  Medication Instructions:  Your physician recommends that you continue on your current medications as directed. Please refer to the Current Medication list given to you today.   Labwork: NONE  Testing/Procedures: NONE  Follow-Up: 02/22/15 @ 2 PM WITH Sabah Zucco, PAC   Any Other Special  Instructions Will Be Listed Below (If Applicable).  If you need a refill on your cardiac medications before your next appointment, please call your pharmacy.       Signed, Richardson Dopp, PA-C  02/01/2015 3:12 PM    [facility] [address], [place], [place]  [address] Phone: [phone]; Fax: [phone]

## 2015-01-31 NOTE — Clinical Social Work Note (Signed)
Clinical Social Work Assessment  Patient Details  Name: Susan [name] MRN: [id number] Date of Birth: [birth year]  Date of referral:  01/31/15               Reason for consult:  Facility Placement, Discharge Planning                Permission sought to share information with:  Family Supports Permission granted to share information::  Yes, Verbal Permission Granted  Name::      Leeanne Mannan)  Agency::   (SNF)  Relationship::   (daughter, Kahlee Vanderwal)  Contact Information:   Sharee Pimple [phone])  Housing/Transportation Living arrangements for the past 2 months:  [place] of Information:  Adult Children Patient Interpreter Needed:  None Criminal Activity/Legal Involvement Pertinent to Current Situation/Hospitalization:  No - Comment as needed Significant Relationships:  Adult Children Lives with:  Self Do you feel safe going back to the place where you live?  No Need for family participation in patient care:  Yes (Comment)  Care giving concerns:   Patient and family has not expressed any care giving concerns at this time.   Social Worker assessment / plan:   Patient a/o x4. Patient is a long term resident at [facility] on [address]. BSW intern spoke with patient at bedside to discuss discharge planning. BSW intern presented patient with SNF list as well as discussed insurance coverage, while at facility. Patient has expressed interest in [facility]. BSW intern also contacted patient's daughter, Sharee Pimple to follow up with patient's discharge disposition. BSW intern to fax patient clinical to SNF's within [facility]. BSW intern to follow up with patient in reference to bed offers extended once clinical have been reviewed by facility. BSW intern remains available if any further Social Work needs arises.  Employment status:  Retired Forensic scientist:  Medicare PT Recommendations:  Inpatient [place] / Referral to community resources:   [place]  Patient/Family's Response to care:   Patient was very understanding and hopeful in returning back to current residence, once d/c from SNF.  Patient appreciated Social Work intervention from [employer].   Patient/Family's Understanding of and Emotional Response to Diagnosis, Current Treatment, and Prognosis:   Patient understands need for further medical care and rehab at SNF.   Emotional Assessment Appearance:  Appears stated age Attitude/Demeanor/Rapport:   (pleasant, anxious) Affect (typically observed):  Pleasant, Accepting, Appropriate Orientation:  Oriented to Self, Oriented to Place, Oriented to  Time, Oriented to Situation Alcohol / Substance use:  Not Applicable Psych involvement (Current and /or in the community):  No (Comment)  Discharge Needs  Concerns to be addressed:  No discharge needs identified Readmission within the last 30 days:  No Current discharge risk:  None Barriers to Discharge:  No Barriers Identified   Leane Call, Student-SW 01/31/2015, 9:50 AM

## 2015-01-31 NOTE — Clinical Social Work Note (Signed)
Patient's dtr refusing to make a decision on SNF location. CSW has shared with pt's dtr, Sharee Pimple that pt is medically stable and  discharged to SNF. Pt's dtr explained wanting to make a decision with sister, Geraldo Pitter.   CSW has emailed and extended bed offers for the second time with pt's dtr. CSW has also confirmed that patient only has a primary insurance, [employer].  Patient's dtr, Sharee Pimple expressed understanding that patient is medically stable and plans to make a decision soon in regards to a SNF location.  CSW remains available as needed.   Glendon Axe, MSW, LCSWA [phone] 01/31/2015 1:22 PM

## 2015-01-31 NOTE — Clinical Social Work Placement (Signed)
   CLINICAL SOCIAL WORK PLACEMENT  NOTE  Date:  01/31/2015  Patient Details  Name: NICKOLAS SHOEMATE MRN: [id number] Date of Birth: 01/13/1940  Clinical Social Work is seeking post-discharge placement for this patient at the [place] level of care (*CSW will initial, date and re-position this form in  chart as items are completed):  Yes   Patient/family provided with [facility] Work Department's list of facilities offering this level of care within the geographic area requested by the patient (or if unable, by the patient's family).  Yes   Patient/family informed of their freedom to choose among providers that offer the needed level of care, that participate in Medicare, Medicaid or managed care program needed by the patient, have an available bed and are willing to accept the patient.  Yes   Patient/family informed of [facility]'s ownership interest in [facility] and [facility], as well as of the fact that they are under no obligation to receive care at these facilities.  PASRR submitted to EDS on       PASRR number received on       Existing PASRR number confirmed on       FL2 transmitted to all facilities in geographic area requested by pt/family on 01/31/15     FL2 transmitted to all facilities within larger geographic area on       Patient informed that his/her managed care company has contracts with or will negotiate with certain facilities, including the following:            Patient/family informed of bed offers received.  Patient chooses bed at       Physician recommends and patient chooses bed at      Patient to be transferred to   on  .  Patient to be transferred to facility by       Patient family notified on   of transfer.  Name of family member notified:        PHYSICIAN Please sign FL2     Additional Comment:    _______________________________________________ Leane Call, Student-SW 01/31/2015, 9:06 AM

## 2015-01-31 NOTE — Progress Notes (Signed)
Hourly rounding. Pt and granddaughter agree on [employer]. Rn went and told social work. Social work requesting granddaughter stay until all paperwork has been signed. Rn reiterated this to pt and granddaughter.

## 2015-01-31 NOTE — Progress Notes (Signed)
Hourly rounding, checked on pt. Pt denies needs.

## 2015-01-31 NOTE — Progress Notes (Signed)
Physical Therapy  Noted plans are now for SNF. Care plan updated.    01/31/15 1405  PT - Assessment/Plan  PT Plan Discharge plan needs to be updated  PT Frequency (ACUTE ONLY) Min 3X/week  Follow Up Recommendations SNF  PT equipment None recommended by PT    01/31/2015 Barry Brunner, PT Pager: [phone]

## 2015-01-31 NOTE — Care Management Important Message (Signed)
Important Message  Patient Details  Name: Susan [name] MRN: [id number] Date of Birth: December 29, 1940   Medicare Important Message Given:  Yes    Barb Merino [place] 01/31/2015, 3:37 PM

## 2015-01-31 NOTE — Discharge Instructions (Signed)
STROKE/TIA DISCHARGE INSTRUCTIONS SMOKING Cigarette smoking nearly doubles your risk of having a stroke & is the single most alterable risk factor  If you smoke or have smoked in the last 12 months, you are advised to quit smoking for your health.Ischemic Stroke Treated With Warfarin An ischemic stroke is the sudden death of brain tissue. Blood carries oxygen to all areas of your body. This type of stroke happens when your blood does not flow to your brain like normal. Your brain cannot get the oxygen it needs. This is an emergency. Symptoms of any stroke usually happen all of a sudden. You may notice them when you wake up. They can include sudden:  Weakness or loss of feeling in your face, arm, or leg, especially on one side of your body.  Confusion.  Trouble talking or understanding.  Trouble seeing.  Trouble walking or moving your arms or legs.  Dizziness.  Loss of balance or coordination.  Severe headache with no known cause. Get help as soon as any of these problems start. This is important so your doctor can treat you right away with:  Medicines that dissolve blood clots.  A device to remove a blood clot. A few hours after the start of your stroke symptoms, your doctor may treat you with:  Oxygen.  Medicines.  A surgery to widen your blood vessels. RISK FACTORS  Risk factors are things that make it more likely for you to have a stroke. These include:  High blood pressure.  High cholesterol.  Diabetes.  Heart disease.  Having a buildup of fatty deposits in the blood vessels.  Having an abnormal heart rhythm (atrial fibrillation).  Being very overweight (obese).  Smoking.  Taking birth control pills, especially if you smoke.  Not being active.  Having a diet that is high in fats, salt, and calories.  Drinking too much alcohol.  Using illegal drugs.  Being African American.  Being over the age of 81.  Having a family history of stroke.  Having a  history of blood clots, stroke, warning stroke (transient ischemic attack, TIA), or heart attack.  Sickle cell disease. HOME CARE  Take all medicines exactly as told by your doctor. Understand all your medicine instructions.  You may need to take a medicine like aspirin or warfarin to thin your blood. Take warfarin exactly as told by your doctor.  Taking too much or too little warfarin is dangerous. Get blood tests as often as told by your doctor, including the PT and INR tests. The test results help your doctor adjust your dose of warfarin.  Food can cause problems with warfarin. Some foods can affect the results of your blood tests. This is true for foods that are high in vitamin K. These foods include spinach, kale, broccoli, cabbage, collard greens, turnip greens, Brussels sprouts, peas, cauliflower, seaweed, parsley, beef liver, pork liver, green tea, and soybean oil. Eat the same amount of food high in vitamin K. Avoid major changes in your diet. Tell your doctor before changing your diet. Talk to a food specialist (dietitian) if you have questions.  Many medicines can cause problems with warfarin. Some can affect your PT and INR test results. Tell your doctor about all medicines you take. This includes vitamins and dietary pills (supplements). Be careful with aspirin and medicines that relieve redness, soreness, and puffiness (inflammation). Do not take or stop medicines unless your doctor tells you to.  Warfarin can cause a lot of bruising or bleeding. Hold pressure over  cuts for longer than normal. Ask your doctor about other side effects of warfarin.  Avoid sports or activities that may cause injury or bleeding.  Be careful when you shave, floss your teeth, or use sharp objects.  Limit or stop alcohol use. Tell your doctor if you change how much alcohol you drink.  Tell your dentist and other doctors that you take warfarin.  If you are able to swallow, eat healthy foods. Eat 5 or  more servings of fruits and vegetables each day. Eat soft foods or pureed foods, or eat small bites of food so you do not choke.  Stay active. Try to get at least 30 minutes of activity on most or all days.  Do not smoke.  Keep your home safe so you do not fall. Try:  Putting grab bars in the bedroom and bathroom.  Raising toilet seats.  Putting a seat in the shower.  Go to physical, occupational, and speech therapy sessions as told by your doctor.  Use a walker or cane all the time if told to do so.  Keep all doctor visits as told. GET HELP RIGHT AWAY IF:   You have sudden weakness or loss of feeling in your face, arm, or leg, especially on one side of your body.  You are suddenly confused.  You have trouble talking or understanding.  You have sudden trouble seeing.  You have sudden trouble walking or moving your arms or legs.  You have sudden dizziness.  You lose your balance, or your movements are not smooth.  You have a sudden, severe headache with no known cause.  You are partly unaware or totally unaware of what is going on around you. Any of these symptoms may be a sign of an emergency. Do not wait to see if the symptoms go away. Call for help (911 in [place] Do not drive yourself to the hospital.   This information is not intended to replace advice given to you by your health care provider. Make sure you discuss any questions you have with your health care provider.   Document Released: 12/13/2010 Document Revised: 01/14/2014 Document Reviewed: 06/14/2013 Elsevier Interactive Patient Education [employer].   Most of the excess cardiovascular risk related to smoking disappears within a year of stopping.  Ask you doctor about anti-smoking medications  [place] Quit Line: 1-800-QUIT NOW  Free Smoking Cessation Classes [phone]  CHOLESTEROL Know your levels; limit fat & cholesterol in your diet  Lipid Panel     Component Value Date/Time   CHOL 204*  01/25/2015 0408   TRIG 156* 01/25/2015 0408   HDL 51 01/25/2015 0408   CHOLHDL 4.0 01/25/2015 0408   VLDL 31 01/25/2015 0408   LDLCALC 122* 01/25/2015 0408      Many patients benefit from treatment even if their cholesterol is at goal.  Goal: Total Cholesterol (CHOL) less than 160  Goal:  Triglycerides (TRIG) less than 150  Goal:  HDL greater than 40  Goal:  LDL (LDLCALC) less than 100   BLOOD PRESSURE American Stroke Association blood pressure target is less that 120/80 mm/Hg  Your discharge blood pressure is:  BP: 135/80 mmHg  Monitor your blood pressure  Limit your salt and alcohol intake  Many individuals will require more than one medication for high blood pressure  DIABETES (A1c is a blood sugar average for last 3 months) Goal HGBA1c is under 7% (HBGA1c is blood sugar average for last 3 months)  Diabetes: No known diagnosis  of diabetes    Lab Results  Component Value Date   HGBA1C 6.1* 01/25/2015     Your HGBA1c can be lowered with medications, healthy diet, and exercise.  Check your blood sugar as directed by your physician  Call your physician if you experience unexplained or low blood sugars.  PHYSICAL ACTIVITY/REHABILITATION Goal is 30 minutes at least 4 days per week  Activity: Increase activity slowly, and Walk with assistance, Therapies: Physical Therapy: Nursing Facility, Occupational Therapy: Nursing Facility and Speech Therapy: [place] Return to work: N/A  Activity decreases your risk of heart attack and stroke and makes your heart stronger.  It helps control your weight and blood pressure; helps you relax and can improve your mood.  Participate in a regular exercise program.  Talk with your doctor about the best form of exercise for you (dancing, walking, swimming, cycling).  DIET/WEIGHT Goal is to maintain a healthy weight  Your discharge diet is: Diet Heart Room service appropriate?: Yes; Fluid consistency:: Thin Diet - low sodium heart  healthy liquids Your height is:  Height: 5\' 5"  (165.1 cm) Your current weight is: Weight: 84.868 kg (187 lb 1.6 oz) Your Body Mass Index (BMI) is:  BMI (Calculated): 31.2  Following the type of diet specifically designed for you will help prevent another stroke.  Your goal weight range is:    Your goal Body Mass Index (BMI) is 19-24.  Healthy food habits can help reduce 3 risk factors for stroke:  High cholesterol, hypertension, and excess weight.  RESOURCES Stroke/Support Group:  Call [phone]   STROKE EDUCATION PROVIDED/REVIEWED AND GIVEN TO PATIENT Stroke warning signs and symptoms How to activate emergency medical system (call 911). Medications prescribed at discharge. Need for follow-up after discharge. Personal risk factors for stroke. Pneumonia vaccine given: No Flu vaccine given: No My questions have been answered, the writing is legible, and I understand these instructions.  I will adhere to these goals & educational materials that have been provided to me after my discharge from the hospital.

## 2015-01-31 NOTE — Clinical Social Work Note (Signed)
Pending [employer] insurance authorization.   Clinical Social Worker facilitated patient discharge including contacting patient family and facility to confirm patient discharge plans.  Clinical information faxed to facility and family agreeable with plan.  CSW arranged ambulance transport via [facility] to [facility] .  RN to call report prior to discharge.  Clinical Social Worker will sign off for now as social work intervention is no longer needed. Please consult [place] again if new need arises.  Glendon Axe, MSW, LCSWA [phone] 01/31/2015 3:06 PM

## 2015-02-01 ENCOUNTER — Ambulatory Visit (INDEPENDENT_AMBULATORY_CARE_PROVIDER_SITE_OTHER): Payer: PPO | Admitting: Physician Assistant

## 2015-02-01 ENCOUNTER — Encounter: Payer: Self-pay | Admitting: Physician Assistant

## 2015-02-01 VITALS — BP 123/56 | HR 60 | Ht 65.0 in | Wt 186.0 lb

## 2015-02-01 DIAGNOSIS — Z8673 Personal history of transient ischemic attack (TIA), and cerebral infarction without residual deficits: Secondary | ICD-10-CM | POA: Diagnosis not present

## 2015-02-01 DIAGNOSIS — I480 Paroxysmal atrial fibrillation: Secondary | ICD-10-CM

## 2015-02-01 DIAGNOSIS — I110 Hypertensive heart disease with heart failure: Secondary | ICD-10-CM | POA: Diagnosis not present

## 2015-02-01 DIAGNOSIS — I420 Dilated cardiomyopathy: Secondary | ICD-10-CM

## 2015-02-01 DIAGNOSIS — I5042 Chronic combined systolic (congestive) and diastolic (congestive) heart failure: Secondary | ICD-10-CM | POA: Diagnosis not present

## 2015-02-01 DIAGNOSIS — G4733 Obstructive sleep apnea (adult) (pediatric): Secondary | ICD-10-CM

## 2015-02-01 HISTORY — DX: Personal history of transient ischemic attack (TIA), and cerebral infarction without residual deficits: Z86.73

## 2015-02-01 NOTE — Patient Instructions (Signed)
Medication Instructions:  Your physician recommends that you continue on your current medications as directed. Please refer to the Current Medication list given to you today.   Labwork: NONE  Testing/Procedures: NONE  Follow-Up: 02/22/15 @ 2 PM WITH SCOTT WEAVER, PAC   Any Other Special Instructions Will Be Listed Below (If Applicable).   If you need a refill on your cardiac medications before your next appointment, please call your pharmacy.

## 2015-02-02 ENCOUNTER — Encounter: Payer: Self-pay | Admitting: Internal Medicine

## 2015-02-02 ENCOUNTER — Non-Acute Institutional Stay (SKILLED_NURSING_FACILITY): Payer: PPO | Admitting: Internal Medicine

## 2015-02-02 DIAGNOSIS — R600 Localized edema: Secondary | ICD-10-CM

## 2015-02-02 DIAGNOSIS — M545 Low back pain: Secondary | ICD-10-CM | POA: Diagnosis not present

## 2015-02-02 DIAGNOSIS — I639 Cerebral infarction, unspecified: Secondary | ICD-10-CM

## 2015-02-02 DIAGNOSIS — R05 Cough: Secondary | ICD-10-CM | POA: Diagnosis not present

## 2015-02-02 DIAGNOSIS — R5381 Other malaise: Secondary | ICD-10-CM

## 2015-02-02 DIAGNOSIS — E785 Hyperlipidemia, unspecified: Secondary | ICD-10-CM

## 2015-02-02 DIAGNOSIS — F32A Depression, unspecified: Secondary | ICD-10-CM

## 2015-02-02 DIAGNOSIS — F329 Major depressive disorder, single episode, unspecified: Secondary | ICD-10-CM

## 2015-02-02 DIAGNOSIS — I110 Hypertensive heart disease with heart failure: Secondary | ICD-10-CM

## 2015-02-02 DIAGNOSIS — G8929 Other chronic pain: Secondary | ICD-10-CM

## 2015-02-02 DIAGNOSIS — I480 Paroxysmal atrial fibrillation: Secondary | ICD-10-CM

## 2015-02-02 DIAGNOSIS — I69359 Hemiplegia and hemiparesis following cerebral infarction affecting unspecified side: Secondary | ICD-10-CM | POA: Diagnosis not present

## 2015-02-02 DIAGNOSIS — I5042 Chronic combined systolic (congestive) and diastolic (congestive) heart failure: Secondary | ICD-10-CM

## 2015-02-02 DIAGNOSIS — R058 Other specified cough: Secondary | ICD-10-CM

## 2015-02-02 NOTE — Progress Notes (Signed)
Patient ID: Susan [name], female   DOB: 16-Dec-1940, 75 y.o.   MRN: [id number]     [facility]  PCP: [name], MD  Code Status: Full Code   Allergies  Allergen Reactions  . Lipitor [Atorvastatin] Swelling  . Simvastatin Other (See Comments)    Myalgias     . Ace Inhibitors Cough    Chief Complaint  Patient presents with  . New Admit To SNF    New Admission      HPI:  75 y.o. 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 MRI brain showed acute left ICA infarct. Neurology was consulted and she is on apixaban and pravachol. She was in afib and cardiology was consulted. She had left leg tenderness and had a doppler which ruled out DVT. She is seen in her room today. She has been working with therapy team and denies any concerns this visit.   Review of Systems:  Constitutional: Negative for fever, chills, diaphoresis.  HENT: Negative for headache, congestion, nasal discharge, difficulty swallowing.   Eyes: Negative for blurred vision, double vision and discharge.  Respiratory: positive for cough mostly dry going on for a month. Denies shortness of breath and wheezing.   Cardiovascular: Negative for chest pain, palpitations, leg swelling.  Gastrointestinal: Negative for heartburn, nausea, vomiting, abdominal pain. Had bowel movement this am Genitourinary: Negative for dysuria  Musculoskeletal: positive for chronic back pain with radiculopathy. No falls in the facility  Skin: Negative for rash.  Neurological: Negative for dizziness Psychiatric/Behavioral: Negative for depression   Past Medical History  Diagnosis Date  . Hyperlipidemia   . Pedal edema   . Spinal stenosis   . Varicose veins     "BLE" (07/29/2012)  . Primary hyperparathyroidism ([place])   . Thyroid disease   . Incontinent of urine     wears pads  . Family  history of anesthesia complication     "some wake up during OR; some are hard to wake up; some both" (07/29/2012)  . OSA on CPAP   . Arthritis     "legs, back" (07/29/2012)  . Osteoarthritis of right knee   . Umbilical hernia     "unrepaired" (07/29/2012)  . Chronic lower back pain   . History of stomach ulcers 1980's  . Anxiety   . Depression   . GERD (gastroesophageal reflux disease)   . Hypertension   . Chronic diastolic CHF (congestive heart failure) ([place])   . Complication of anesthesia     "I have apnea" (07/29/2012)  . Dementia     per family   Past Surgical History  Procedure Laterality Date  . Iud removal  1980's  . Parathyroidectomy N/A 06/16/2012    Procedure: NECK EXPLORATION AND LEFT SUPERIOR PARATHYROIDECTOMY;  Surgeon: Earnstine Regal, MD;  Location: [facility];  Service: General;  Laterality: N/A;  . Colonoscopy    . 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 Cholecystectomy  ~ 2010  . Total knee arthroplasty Right 07/29/2012    Procedure: TOTAL KNEE ARTHROPLASTY;  Surgeon: Ninetta Lights, MD;  Location: [facility];  Service: Orthopedics;  Laterality: Right;   Social History:   reports that she quit smoking about 37 years ago. Her smoking use included Cigarettes. She has a 30 pack-year smoking history. She has never used smokeless tobacco. She reports that she does not drink alcohol or use illicit drugs.  Family History  Problem Relation Age of Onset  . Hypertension Mother     deceased  . Lung cancer Father     deceased  . Stroke Mother     Medications:   Medication List       This list is accurate as of: 02/02/15 11:55 AM.  Always use your most recent med list.               apixaban 5 MG Tabs tablet  Commonly known as:  ELIQUIS  Take 1 tablet (5 mg total) by mouth every 12 (twelve) hours.     buPROPion 300 MG 24 hr tablet  Commonly known as:  WELLBUTRIN XL  Take 300 mg by mouth daily.     carvedilol 3.125 MG tablet  Commonly known as:  COREG  Take 3.125  mg by mouth 2 (two) times daily with a meal.     cholecalciferol 1000 units tablet  Commonly known as:  VITAMIN D  Take 1,000 Units by mouth daily.     ezetimibe 10 MG tablet  Commonly known as:  ZETIA  Take 1 tablet (10 mg total) by mouth daily.     furosemide 20 MG tablet  Commonly known as:  LASIX  Take 20 mg by mouth daily.     gabapentin 300 MG capsule  Commonly known as:  NEURONTIN  Take 1 capsule (300 mg total) by mouth at bedtime.     multivitamin with minerals Tabs tablet  Take 1 tablet by mouth daily.     pravastatin 20 MG tablet  Commonly known as:  PRAVACHOL  Take 20 mg by mouth every morning.     valsartan 320 MG tablet  Commonly known as:  DIOVAN  TAKE 1 TABLET BY MOUTH EVERY DAY     vitamin B-12 100 MCG tablet  Commonly known as:  CYANOCOBALAMIN  Take 100 mcg by mouth daily.         Physical Exam: Filed Vitals:   02/02/15 1149  BP: 168/85  Pulse: 69  Temp: 97.9 F (36.6 C)  TempSrc: Oral  Resp: 20  Height: 5\' 5"  (1.651 m)  Weight: 187 lb (84.823 kg)  SpO2: 97%    General- elderly female, obese, in no acute distress Head- normocephalic, atraumatic Nose- no maxillary or frontal sinus tenderness, no nasal discharge Throat- moist mucus membrane  Eyes- PERRLA, EOMI, no pallor, no icterus, no discharge, normal conjunctiva, normal sclera Neck- no cervical lymphadenopathy Cardiovascular- normal s1,s2, no murmurs, 1+ leg edema Respiratory- bilateral clear to auscultation, no wheeze, no rhonchi, no crackles, no use of accessory muscles Abdomen- bowel sounds present, soft, non tender Musculoskeletal- able to move all 4 extremities, generalized weakness right > left  Neurological- alert and oriented to person, place and time Skin- warm and dry Psychiatry- normal mood and affect    Labs reviewed: Basic Metabolic Panel:  Recent Labs  01/28/15 1222 01/28/15 1231 01/29/15 1225 01/30/15 0730  NA 142 143 142 141  K 4.5 4.5 3.9 4.0  CL 107 105  107 106  CO2 25  --  27 28  GLUCOSE 92 92 128* 102*  BUN 17 25* 17 17  CREATININE 1.12* 1.10* 1.06* 1.09*  CALCIUM 10.1  --  10.3 10.1   Liver Function Tests:  Recent Labs  01/28/15 1222 01/29/15 1225 01/30/15 0730  AST 29 25 21   ALT 20 20 18   ALKPHOS 62 67 63  BILITOT 1.2 0.8 0.8  PROT 6.7 7.0 6.5  ALBUMIN 3.9  3.7 3.6   No results for input(s): LIPASE, AMYLASE in the last 8760 hours. No results for input(s): AMMONIA in the last 8760 hours. CBC:  Recent Labs  01/28/15 1222 01/28/15 1231 01/29/15 1225 01/30/15 0730  WBC 3.3*  --  3.9* 4.3  NEUTROABS 1.7  --  2.3 2.3  HGB 13.2 14.3 13.4 13.3  HCT 39.1 42.0 40.4 39.8  MCV 94.4  --  95.1 94.5  PLT 218  --  244 216   Cardiac Enzymes: No results for input(s): CKTOTAL, CKMB, CKMBINDEX, TROPONINI in the last 8760 hours. BNP: Invalid input(s): POCBNP CBG: No results for input(s): GLUCAP in the last 8760 hours.  Radiological Exams: Mr Herby Abraham Contrast  01/28/2015  CLINICAL DATA:  75 year old hypertensive female with 3 day history of right-sided weakness and slurred speech. Subsequent encounter. EXAM: MRI HEAD WITHOUT CONTRAST TECHNIQUE: Multiplanar, multiecho pulse sequences of the brain and surrounding structures were obtained without intravenous contrast. COMPARISON:  01/25/2015. FINDINGS: Exam is motion degraded. 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 Mild to moderate chronic small vessel disease changes. Global atrophy. Ventricular prominence felt to be related to atrophy rather hydrocephalus. No intracranial mass lesion noted on this unenhanced exam. Major intracranial vascular structures are patent. Please see recent MR angiogram report. Ectatic right carotid terminus. 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 Transverse ligament hypertrophy. Exophthalmos. IMPRESSION: Exam is motion degraded. 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 Mild to moderate  chronic small vessel disease changes. Global atrophy. Ventricular prominence felt to be related to atrophy rather hydrocephalus. No intracranial mass lesion noted on this unenhanced exam. Major intracranial vascular structures are patent. Please see recent MR angiogram report. Ectatic right carotid terminus. 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 Exophthalmos. Electronically Signed   By: Genia Del M.D.   On: 01/28/2015 13:47     Assessment/Plan  Physical deconditioning Will have her work with physical therapy and occupational therapy team to help with gait training and muscle strengthening exercises.fall precautions. Skin care. Encourage to be out of bed.   Acute CVA Had ischemic stroke with right sided weakness. Will have patient work with PT/OT as tolerated to regain strength and restore function.  Fall precautions are in place. Continue eliquis 5 mg bid and statin along with BP medication  Hemiparesis on right side Post cva, Will have patient work with PT/OT as tolerated to regain strength and restore function.  Fall precautions are in place.  afib Rate controlled but has elevated BP. Was supposed to be on coreg 6.25 bid as per discharge note. Increase coreg to 6.25 mg bid  Leg edema DVT has been ruled out. Add ted hose to both legs, keep legs elevated at rest. Continue lasix 20 mg daily  Cough Add robitussin dm tid for a week and reassess. Lung clear on auscultation and clear oropharynx on exam.  Chronic CHF Monitor her weight. Continue valsartan, lasix and coreg. Start ReDS vest protocol.  HTN Elevated bp, adjusted dosing of coreg as above. Continue valsartan and monitor BP bid  Low back pain with radiculopathy Continue neurontin 300 mg qhs  HLD Continue pravastatin and zetia  Chronic depression Stable mood. Continue wellbutrin 300 mg daily   Goals of care: short term rehabilitation   Labs/tests ordered: cbc, cmp 02/06/15  Family/ staff Communication:  reviewed care plan with patient, her daughter over the phone and nursing supervisor    Blanchie Serve, [place] [phone] (Monday-Friday 8 am -  5 pm) [phone] (afterhours)

## 2015-02-08 ENCOUNTER — Telehealth: Payer: Self-pay

## 2015-02-08 DIAGNOSIS — I639 Cerebral infarction, unspecified: Secondary | ICD-10-CM | POA: Diagnosis not present

## 2015-02-08 DIAGNOSIS — R41841 Cognitive communication deficit: Secondary | ICD-10-CM | POA: Diagnosis not present

## 2015-02-08 DIAGNOSIS — I69322 Dysarthria following cerebral infarction: Secondary | ICD-10-CM | POA: Diagnosis not present

## 2015-02-08 DIAGNOSIS — R262 Difficulty in walking, not elsewhere classified: Secondary | ICD-10-CM | POA: Diagnosis not present

## 2015-02-08 DIAGNOSIS — I69311 Memory deficit following cerebral infarction: Secondary | ICD-10-CM | POA: Diagnosis not present

## 2015-02-08 DIAGNOSIS — R278 Other lack of coordination: Secondary | ICD-10-CM | POA: Diagnosis not present

## 2015-02-08 DIAGNOSIS — M6281 Muscle weakness (generalized): Secondary | ICD-10-CM | POA: Diagnosis not present

## 2015-02-08 DIAGNOSIS — I69351 Hemiplegia and hemiparesis following cerebral infarction affecting right dominant side: Secondary | ICD-10-CM | POA: Diagnosis not present

## 2015-02-08 DIAGNOSIS — I480 Paroxysmal atrial fibrillation: Secondary | ICD-10-CM

## 2015-02-08 NOTE — Telephone Encounter (Signed)
Per Dr. Radford Pax, patient to complete 24 hour BP monitor AND 30 day event monitor.  Patient agrees with treatment plan.

## 2015-02-08 NOTE — Telephone Encounter (Signed)
-----   Message from Sueanne Margarita, MD sent at 02/08/2015  3:42 PM EST ----- Regarding: RE: 24 hour monitor I want both a 24 hour BP monitor and an event monitor to assess degree of silent PAF ----- Message -----    From: Theodoro Parma, RN    Sent: 02/08/2015   3:12 PM      To: Sueanne Margarita, MD Subject: RE: 24 hour monitor                            This is a 24 hour blood pressure monitor. Do you want me to change to holter for PAF? ----- Message -----    From: Sueanne Margarita, MD    Sent: 02/08/2015  12:21 PM      To: Theodoro Parma, RN Subject: RE: 24 hour monitor                            Needs monitor to assess how much PAF she is having ----- Message -----    From: Theodoro Parma, RN    Sent: 02/08/2015  11:18 AM      To: Sueanne Margarita, MD Subject: FW: 24 hour monitor                            Please see below.   I was going to tell her to go ahead and get the monitor, but given all that has happened, I wanted to check with you first. ----- Message -----    From: Jeanie Sewer    Sent: 02/08/2015  10:26 AM      To: Theodoro Parma, RN Subject: 24 hour monitor                                Pt is in rehab right now she doesn't think she needs the monitor at this time. Please advise if she needs to get the monitor or not..   Thanks  sonya

## 2015-02-09 ENCOUNTER — Non-Acute Institutional Stay (SKILLED_NURSING_FACILITY): Payer: PPO | Admitting: Family

## 2015-02-09 DIAGNOSIS — R079 Chest pain, unspecified: Secondary | ICD-10-CM | POA: Diagnosis not present

## 2015-02-09 DIAGNOSIS — R05 Cough: Secondary | ICD-10-CM | POA: Diagnosis not present

## 2015-02-09 DIAGNOSIS — R053 Chronic cough: Secondary | ICD-10-CM

## 2015-02-09 DIAGNOSIS — R600 Localized edema: Secondary | ICD-10-CM

## 2015-02-09 DIAGNOSIS — G609 Hereditary and idiopathic neuropathy, unspecified: Secondary | ICD-10-CM

## 2015-02-09 DIAGNOSIS — M25579 Pain in unspecified ankle and joints of unspecified foot: Secondary | ICD-10-CM | POA: Diagnosis not present

## 2015-02-09 DIAGNOSIS — R609 Edema, unspecified: Secondary | ICD-10-CM | POA: Insufficient documentation

## 2015-02-09 DIAGNOSIS — M25559 Pain in unspecified hip: Secondary | ICD-10-CM | POA: Diagnosis not present

## 2015-02-09 NOTE — Progress Notes (Signed)
Patient ID: Susan [name], female   DOB: 04-Feb-1940, 75 y.o.   MRN: [id number]  Location: [facility]  Provider:  Blanchie Serve, MD   [name], MD  Code Status:  Full Code  Goals of care: Advanced Directive information Advanced Directives 01/28/2015  Does patient have an advance directive? Yes  Type of Advance Directive Healthcare Power of Attorney  Does patient want to make changes to advanced directive? -  Copy of advanced directive(s) in chart? -  Pre-existing out of facility DNR order (yellow form or pink MOST form) -     Chief Complaint  Patient presents with   Acute Visit    HPI:  Pt is a 75 y.o. female seen today at [facility] for acute medical issues.She is here here for short term rehabilitation post hospital admission from 01/28/15-01/31/15 with acute ischemic stroke with right sided weakness and right facial droop. MRI brain showed acute left ICA infarct currently on apixaban and pravachol. She was in afib and cardiology was consulted. She had left leg tenderness and had a doppler which ruled out DVT. She has a past medical history of OA, pedal edema, Thyroid disease among others. She is seen in her room today. She complains of cough, left ankle swelling and numbness and tingling on both feet. She is currently taking Gabapentin for numbness and tingling.She has been working well with physical therapy.   Review of Systems  Constitutional: Negative for fever, chills and malaise/fatigue.  HENT: Negative.   Respiratory: Positive for cough. Negative for sputum production, shortness of breath and wheezing.   Cardiovascular: Positive for leg swelling.       Left ankle   Gastrointestinal: Negative.   Genitourinary: Negative.   Musculoskeletal: Negative for joint pain and falls.  Skin: Negative.   Neurological: Negative.   Psychiatric/Behavioral: Negative.     Past Medical History  Diagnosis Date    Hyperlipidemia    Pedal edema    Spinal stenosis    Varicose veins     "BLE" (07/29/2012)   Primary hyperparathyroidism ([place])    Thyroid disease    Incontinent of urine     wears pads   Family history of anesthesia complication     "some wake up during OR; some are hard to wake up; some both" (07/29/2012)   OSA on CPAP    Arthritis     "legs, back" (07/29/2012)   Osteoarthritis of right knee    Umbilical hernia     "unrepaired" (07/29/2012)   Chronic lower back pain    History of stomach ulcers 1980's   Anxiety    Depression    GERD (gastroesophageal reflux disease)    Hypertension    Chronic diastolic CHF (congestive heart failure) ([place])    Complication of anesthesia     "I have apnea" (07/29/2012)   Dementia     per family   Past Surgical History  Procedure Laterality Date   Iud removal  1980's   Parathyroidectomy N/A 06/16/2012    Procedure: NECK EXPLORATION AND LEFT SUPERIOR PARATHYROIDECTOMY;  Surgeon: Earnstine Regal, MD;  Location: [facility];  Service: General;  Laterality: N/A;   Colonoscopy     Total knee arthroplasty Right 07/29/2012   Cholecystectomy  ~ 2010   Total knee arthroplasty Right 07/29/2012    Procedure: TOTAL KNEE ARTHROPLASTY;  Surgeon: Ninetta Lights, MD;  Location: [facility];  Service: Orthopedics;  Laterality: Right;    Allergies  Allergen Reactions  Lipitor [Atorvastatin] Swelling   Simvastatin Other (See Comments)    Myalgias      Ace Inhibitors Cough      Medication List       This list is accurate as of: 02/09/15  6:44 PM.  Always use your most recent med list.               apixaban 5 MG Tabs tablet  Commonly known as:  ELIQUIS  Take 1 tablet (5 mg total) by mouth every 12 (twelve) hours.     buPROPion 300 MG 24 hr tablet  Commonly known as:  WELLBUTRIN XL  Take 300 mg by mouth daily.     carvedilol 3.125 MG tablet  Commonly known as:  COREG  Take 3.125 mg by mouth 2 (two) times daily with a meal.      cholecalciferol 1000 units tablet  Commonly known as:  VITAMIN D  Take 1,000 Units by mouth daily.     ezetimibe 10 MG tablet  Commonly known as:  ZETIA  Take 1 tablet (10 mg total) by mouth daily.     furosemide 20 MG tablet  Commonly known as:  LASIX  Take 40 mg by mouth daily.     gabapentin 300 MG capsule  Commonly known as:  NEURONTIN  Take 1 capsule (300 mg total) by mouth at bedtime.     multivitamin with minerals Tabs tablet  Take 1 tablet by mouth daily.     pravastatin 20 MG tablet  Commonly known as:  PRAVACHOL  Take 20 mg by mouth every morning.     valsartan 320 MG tablet  Commonly known as:  DIOVAN  TAKE 1 TABLET BY MOUTH EVERY DAY     vitamin B-12 100 MCG tablet  Commonly known as:  CYANOCOBALAMIN  Take 100 mcg by mouth daily.        Immunization History  Administered Date(s) Administered   Influenza Whole 10/02/2010   PPD Test 01/31/2015   Pneumococcal-Unspecified 01/08/2012   Pertinent  Health Maintenance Due  Topic Date Due   DEXA SCAN  01/22/2003   PNA vac Low Risk Adult (2 of 2 - PCV13) 01/07/2013   INFLUENZA VACCINE  08/08/2014   No flowsheet data found.  Filed Vitals:   02/09/15 1810  BP: 148/69  Pulse: 70  Temp: 97.4 F (36.3 C)  Resp: 20  Weight: 201 lb (91.173 kg)  SpO2: 96%   Body mass index is 33.45 kg/(m^2). Physical Exam  Constitutional: She appears well-developed and well-nourished.  Elderly in no acute distress.   HENT:  Head: Normocephalic.  Eyes: EOM are normal. Pupils are equal, round, and reactive to light.  Neck: Normal range of motion.  Cardiovascular: Normal rate and regular rhythm.  Exam reveals no gallop and no friction rub.   No murmur heard. Pulmonary/Chest: Effort normal.  Diminished Bilateral breath sounds to bases.   Musculoskeletal: Normal range of motion. She exhibits no tenderness.  Right ankle/Foot  Pitting edema +2 Non-tender to touch, no redness and no pain with ROM.   Lymphadenopathy:     She has no cervical adenopathy.  Neurological: She is alert.  Psychiatric: She has a normal mood and affect.    Labs reviewed:  Recent Labs  01/28/15 1222 01/28/15 1231 01/29/15 1225 01/30/15 0730  NA 142 143 142 141  K 4.5 4.5 3.9 4.0  CL 107 105 107 106  CO2 25  --  27 28  GLUCOSE 92 92 128* 102*  BUN  17 25* 17 17  CREATININE 1.12* 1.10* 1.06* 1.09*  CALCIUM 10.1  --  10.3 10.1    Recent Labs  01/28/15 1222 01/29/15 1225 01/30/15 0730  AST 29 25 21   ALT 20 20 18   ALKPHOS 62 67 63  BILITOT 1.2 0.8 0.8  PROT 6.7 7.0 6.5  ALBUMIN 3.9 3.7 3.6    Recent Labs  01/28/15 1222 01/28/15 1231 01/29/15 1225 01/30/15 0730  WBC 3.3*  --  3.9* 4.3  NEUTROABS 1.7  --  2.3 2.3  HGB 13.2 14.3 13.4 13.3  HCT 39.1 42.0 40.4 39.8  MCV 94.4  --  95.1 94.5  PLT 218  --  244 216   No results found for: TSH Lab Results  Component Value Date   HGBA1C 6.1* 01/25/2015   Lab Results  Component Value Date   CHOL 204* 01/25/2015   HDL 51 01/25/2015   LDLCALC 122* 01/25/2015   TRIG 156* 01/25/2015   CHOLHDL 4.0 01/25/2015    Significant Diagnostic Results in last 30 days:  Dg Chest 2 View  01/25/2015  CLINICAL DATA:  Generalized weakness. Recent transient ischemic attack EXAM: CHEST  2 VIEW COMPARISON:  Chest radiograph March 05, 2013; chest CT March 05, 2013 FINDINGS: Mild generalized interstitial prominence is stable. There is no frank edema or consolidation. There is slight cardiomegaly with slight pulmonary venous hypertension. No adenopathy apparent. There is degenerative change in the thoracic spine. IMPRESSION: Findings consistent with a degree of pulmonary vascular congestion without frank edema or consolidation. No change in cardiac silhouette. Electronically Signed   By: Lowella Grip III M.D.   On: 01/25/2015 07:58   Ct Head Wo Contrast  01/25/2015  CLINICAL DATA:  TIA status post tPA. EXAM: CT HEAD WITHOUT CONTRAST TECHNIQUE: Contiguous axial images were  obtained from the base of the skull through the vertex without intravenous contrast. COMPARISON:  01/24/2015 head CT. FINDINGS: No evidence of parenchymal hemorrhage or extra-axial fluid collection. No mass lesion, mass effect, or midline shift. No CT evidence of acute infarction. Mild intracranial atherosclerosis. Nonspecific stable subcortical and periventricular white matter hypodensity, most in keeping with chronic small vessel ischemic change. Mild diffuse cerebral volume loss. No ventriculomegaly. The visualized paranasal sinuses are essentially clear. The mastoid air cells are unopacified. No evidence of calvarial fracture. IMPRESSION: 1.  No evidence of acute intracranial abnormality. 2. Mild cerebral volume loss and mild chronic small vessel ischemic white matter change. Electronically Signed   By: Ilona Sorrel M.D.   On: 01/25/2015 08:01   Ct Head Wo Contrast  01/24/2015  ADDENDUM REPORT: 01/24/2015 13:55 ADDENDUM: Critical Value/emergent results were called by telephone at the time of interpretation on 01/24/2015 at 1:54 pm to Dr. Aram Beecham, neurology , who verbally acknowledged these results. Electronically Signed   By: Lowella Grip III M.D.   On: 01/24/2015 13:55  01/24/2015  CLINICAL DATA:  Acute onset right-sided weakness and facial droop EXAM: CT HEAD WITHOUT CONTRAST TECHNIQUE: Contiguous axial images were obtained from the base of the skull through the vertex without intravenous contrast. COMPARISON:  April 24, 2011 FINDINGS: Mild diffuse atrophy is stable. There is no intracranial mass, hemorrhage, extra-axial fluid collection, or midline shift. There is mild small vessel disease in the centra semiovale bilaterally. Elsewhere gray-white compartments appear normal. No acute infarct identified. Middle cerebral artery attenuation is symmetric and normal bilaterally. Bony calvarium appears intact. Visualized mastoid air cells are clear. There is opacification in several ethmoid air cells. No  intraorbital lesions are  appreciable. IMPRESSION: Stable atrophy with patchy periventricular small vessel disease. No intracranial mass, hemorrhage, or acute appearing infarct. There is opacification of several left-sided ethmoid air cells. Electronically Signed: By: Lowella Grip III M.D. On: 01/24/2015 13:52   Mr Brain Wo Contrast  01/28/2015  CLINICAL DATA:  75 year old hypertensive female with 3 day history of right-sided weakness and slurred speech. Subsequent encounter. EXAM: MRI HEAD WITHOUT CONTRAST TECHNIQUE: Multiplanar, multiecho pulse sequences of the brain and surrounding structures were obtained without intravenous contrast. COMPARISON:  01/25/2015. FINDINGS: Exam is motion degraded. 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 Mild to moderate chronic small vessel disease changes. Global atrophy. Ventricular prominence felt to be related to atrophy rather hydrocephalus. No intracranial mass lesion noted on this unenhanced exam. Major intracranial vascular structures are patent. Please see recent MR angiogram report. Ectatic right carotid terminus. 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 Transverse ligament hypertrophy. Exophthalmos. IMPRESSION: Exam is motion degraded. 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 Mild to moderate chronic small vessel disease changes. Global atrophy. Ventricular prominence felt to be related to atrophy rather hydrocephalus. No intracranial mass lesion noted on this unenhanced exam. Major intracranial vascular structures are patent. Please see recent MR angiogram report. Ectatic right carotid terminus. 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 Exophthalmos. Electronically Signed   By: Genia Del M.D.   On: 01/28/2015 13:47   Mr Brain Wo Contrast  01/25/2015  CLINICAL DATA:  Right-sided weakness and slurred speech at primary care office today. Symptoms nearly resolved at time of  presentation. EXAM: MRI HEAD WITHOUT CONTRAST MRA HEAD WITHOUT CONTRAST TECHNIQUE: Multiplanar, multiecho pulse sequences of the brain and surrounding structures were obtained without intravenous contrast. Angiographic images of the head were obtained using MRA technique without contrast. COMPARISON:  None. FINDINGS: MRI HEAD FINDINGS Calvarium and upper cervical spine: Cervical spine degeneration without visible cord compression. No focal marrow signal abnormality. Orbits: Negative. Sinuses and Mastoids: Clear. Brain: Diffusion signal in the left cortical spinal tract is roughly symmetric, especially on coronal DWI acquisition. There is no convincing acute infarct. No hemorrhage, hydrocephalus, or major vessel occlusion. No evidence of mass lesion. Generalized cerebral volume loss which is age congruent, as is small-vessel ischemic change in the periventricular white matter. 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 Fairly symmetric vertebral arteries with dominant right AICA and left PICA. Isolated left MCA show superimposed high-grade narrowing with flow gap in the inferior and superior division branches at the M1-M2 junction. Given clustered appearance and proximity to imaging slab artifact is considered, but there is no artifact elsewhere on the scan at this level. Left MCA branches have an attenuated appearance compared to the right from slow or underfilling. There is also multifocal narrowing of bilateral medium-sized arteries, worse in the anterior circulation, consistent with atherosclerosis. Some of the stenoses are high-grade. There is no dilatation/beading beyond the stenoses. In the posterior circulation, beginning at the P2 segments there is bilateral intermittent atherosclerotic type narrowing which is  most proximal and high-grade at the left P2-P3 junction. A 1-2 mm bulge medially from the anterior genu of the right carotid siphon could be atherosclerotic, infundibular, or aneurysmal. IMPRESSION: 1. Negative for acute infarct. 2. Extensive intracranial atherosclerosis. High-grade left M1 -M2 branch stenosis is the most proximal and pertinent to  the reported deficits. Please see further description above. Electronically Signed   By: Monte Fantasia M.D.   On: 01/25/2015 07:14   Mr Jodene Nam Head/brain Wo Cm  01/25/2015  CLINICAL DATA:  Right-sided weakness and slurred speech at primary care office today. Symptoms nearly resolved at time of presentation. EXAM: MRI HEAD WITHOUT CONTRAST MRA HEAD WITHOUT CONTRAST TECHNIQUE: Multiplanar, multiecho pulse sequences of the brain and surrounding structures were obtained without intravenous contrast. Angiographic images of the head were obtained using MRA technique without contrast. COMPARISON:  None. FINDINGS: MRI HEAD FINDINGS Calvarium and upper cervical spine: Cervical spine degeneration without visible cord compression. No focal marrow signal abnormality. Orbits: Negative. Sinuses and Mastoids: Clear. Brain: Diffusion signal in the left cortical spinal tract is roughly symmetric, especially on coronal DWI acquisition. There is no convincing acute infarct. No hemorrhage, hydrocephalus, or major vessel occlusion. No evidence of mass lesion. Generalized cerebral volume loss which is age congruent, as is small-vessel ischemic change in the periventricular white matter. 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 Fairly symmetric vertebral arteries with dominant right AICA and left PICA. Isolated left MCA show superimposed high-grade narrowing with flow gap in the inferior and  superior division branches at the M1-M2 junction. Given clustered appearance and proximity to imaging slab artifact is considered, but there is no artifact elsewhere on the scan at this level. Left MCA branches have an attenuated appearance compared to the right from slow or underfilling. There is also multifocal narrowing of bilateral medium-sized arteries, worse in the anterior circulation, consistent with atherosclerosis. Some of the stenoses are high-grade. There is no dilatation/beading beyond the stenoses. In the posterior circulation, beginning at the P2 segments there is bilateral intermittent atherosclerotic type narrowing which is most proximal and high-grade at the left P2-P3 junction. A 1-2 mm bulge medially from the anterior genu of the right carotid siphon could be atherosclerotic, infundibular, or aneurysmal. IMPRESSION: 1. Negative for acute infarct. 2. Extensive intracranial atherosclerosis. High-grade left M1 -M2 branch stenosis is the most proximal and pertinent to the reported deficits. Please see further description above. Electronically Signed   By: Monte Fantasia M.D.   On: 01/25/2015 07:14    Assessment/Plan Edema:  Left ankle/Foot  Previous Doppler studies negative for DVT.Will obtain portable X-ray 2 views r/o fractures. Continue with Bilateral Ted hose and encourage to elevate legs during the day.   Peripheral Neuropathy: Worse on feet. Continue with Gabapentin.Fall and safety precautions.   Cough: Has bilat. Lung bases diminished breath sounds. Will obtain CXR PA/Lat. Continue on diuretics. Monitor weight daily.    Family/ staff Communication: Reviewed plan of care with patient's daughter Greta Doom at [phone] and facility staff.  Labs/tests ordered: Portable CXR 2 views  PA/Lat ( cough); X-ray left Ankle /foot 2 views ( swelling).

## 2015-02-10 DIAGNOSIS — F329 Major depressive disorder, single episode, unspecified: Secondary | ICD-10-CM | POA: Diagnosis not present

## 2015-02-10 DIAGNOSIS — H25813 Combined forms of age-related cataract, bilateral: Secondary | ICD-10-CM | POA: Diagnosis not present

## 2015-02-10 DIAGNOSIS — F419 Anxiety disorder, unspecified: Secondary | ICD-10-CM | POA: Diagnosis not present

## 2015-02-10 DIAGNOSIS — D508 Other iron deficiency anemias: Secondary | ICD-10-CM | POA: Diagnosis not present

## 2015-02-14 DIAGNOSIS — F419 Anxiety disorder, unspecified: Secondary | ICD-10-CM | POA: Diagnosis not present

## 2015-02-14 DIAGNOSIS — F329 Major depressive disorder, single episode, unspecified: Secondary | ICD-10-CM | POA: Diagnosis not present

## 2015-02-17 ENCOUNTER — Non-Acute Institutional Stay (SKILLED_NURSING_FACILITY): Payer: PPO | Admitting: Family

## 2015-02-17 DIAGNOSIS — M545 Low back pain, unspecified: Secondary | ICD-10-CM

## 2015-02-17 DIAGNOSIS — F329 Major depressive disorder, single episode, unspecified: Secondary | ICD-10-CM

## 2015-02-17 DIAGNOSIS — I69359 Hemiplegia and hemiparesis following cerebral infarction affecting unspecified side: Secondary | ICD-10-CM

## 2015-02-17 DIAGNOSIS — G8929 Other chronic pain: Secondary | ICD-10-CM | POA: Diagnosis not present

## 2015-02-17 DIAGNOSIS — R600 Localized edema: Secondary | ICD-10-CM

## 2015-02-17 DIAGNOSIS — I110 Hypertensive heart disease with heart failure: Secondary | ICD-10-CM | POA: Diagnosis not present

## 2015-02-17 DIAGNOSIS — I5042 Chronic combined systolic (congestive) and diastolic (congestive) heart failure: Secondary | ICD-10-CM | POA: Diagnosis not present

## 2015-02-17 DIAGNOSIS — F32A Depression, unspecified: Secondary | ICD-10-CM

## 2015-02-17 DIAGNOSIS — I480 Paroxysmal atrial fibrillation: Secondary | ICD-10-CM | POA: Diagnosis not present

## 2015-02-17 DIAGNOSIS — H269 Unspecified cataract: Secondary | ICD-10-CM

## 2015-02-17 NOTE — Progress Notes (Signed)
Patient ID: Susan [name], female   DOB: Sep 27, 1940, 75 y.o.   MRN: [id number]  Location: [facility]  Provider:Mahima Bubba Camp, MD   PCP: [name], MD  Code Status:Full Code  Goals of care:  Advanced Directive information Advanced Directives 01/28/2015  Does patient have an advance directive? Yes  Type of Advance Directive [place]  Does patient want to make changes to advanced directive? -  Copy of advanced directive(s) in chart? -     Allergies  Allergen Reactions  . Lipitor [Atorvastatin] Swelling  . Simvastatin Other (See Comments)    Myalgias     . Ace Inhibitors Cough    Chief Complaint  Patient presents with  . Discharge Note    HPI:  75 y.o. female seen at [facility] for discharge home. She is here for short term Rehabilitation post hospital admission from 01/28/15-01/31/15 with acute ischemic stroke with right sided weakness and right facial droop. MRI brain showed acute left ICA infarct. Neurology was consulted and she is on apixaban and pravachol. She was in Afib and cardiology was consulted. She had left leg tenderness and had a doppler which ruled out DVT. She is seen in her room today. She denies any acute issues. She has worked with PT/OT and now stable for discharge. Will discharge home with PT/OT for exercise, gait stability, ROM and muscle strengthening. She has her own walker ordered prior to hosp discharge.    Review of Systems  Constitutional: Negative for fever and chills.  HENT: Negative.   Eyes:       Cataract. She is is scheduled for right eye cataract surgery 02/20/2015 at 9:30am at [facility]. F/u with Dr. Katy Fitch.   Respiratory: Negative for cough, sputum production, shortness of breath and wheezing.   Cardiovascular: Negative for chest pain, palpitations, orthopnea and PND.  Gastrointestinal: Negative.   Genitourinary: Negative.   Musculoskeletal:  Positive for back pain. Negative for joint pain and falls.  Skin: Negative.   Neurological: Negative for dizziness, tingling, tremors, sensory change, speech change, seizures and loss of consciousness.       Right side weakness.   Endo/Heme/Allergies: Negative.   Psychiatric/Behavioral: Negative.     Past Medical History  Diagnosis Date  . Hyperlipidemia   . Pedal edema   . Spinal stenosis   . Varicose veins     "BLE" (07/29/2012)  . Primary hyperparathyroidism ([place])   . Thyroid disease   . Incontinent of urine     wears pads  . Family history of anesthesia complication     "some wake up during OR; some are hard to wake up; some both" (07/29/2012)  . OSA on CPAP   . Arthritis     "legs, back" (07/29/2012)  . Osteoarthritis of right knee   . Umbilical hernia     "unrepaired" (07/29/2012)  . Chronic lower back pain   . History of stomach ulcers 1980's  . Anxiety   . Depression   . GERD (gastroesophageal reflux disease)   . Hypertension   . Chronic diastolic CHF (congestive heart failure) ([place])   . Complication of anesthesia     "I have apnea" (07/29/2012)  . Dementia     per family    Past Surgical History  Procedure Laterality Date  . Iud removal  1980's  . Parathyroidectomy N/A 06/16/2012    Procedure: NECK EXPLORATION AND LEFT SUPERIOR PARATHYROIDECTOMY;  Surgeon: Earnstine Regal, MD;  Location:  [facility];  Service: General;  Laterality: N/A;  . Colonoscopy    . 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 Cholecystectomy  ~ 2010  . Total knee arthroplasty Right 07/29/2012    Procedure: TOTAL KNEE ARTHROPLASTY;  Surgeon: Ninetta Lights, MD;  Location: [place];  Service: Orthopedics;  Laterality: Right;      reports that she quit smoking about 37 years ago. Her smoking use included Cigarettes. She has a 30 pack-year smoking history. She has never used smokeless tobacco. She reports that she does not drink alcohol or use illicit drugs.  Allergies  Allergen Reactions  . Lipitor  [Atorvastatin] Swelling  . Simvastatin Other (See Comments)    Myalgias     . Ace Inhibitors Cough    Pertinent  Health Maintenance Due  Topic Date Due  . DEXA SCAN  01/22/2003  . PNA vac Low Risk Adult (2 of 2 - PCV13) 01/07/2013  . INFLUENZA VACCINE  08/08/2014    Medications:   Medication List       This list is accurate as of: 02/17/15  6:25 PM.  Always use your most recent med list.               apixaban 5 MG Tabs tablet  Commonly known as:  ELIQUIS  Take 1 tablet (5 mg total) by mouth every 12 (twelve) hours.     buPROPion 300 MG 24 hr tablet  Commonly known as:  WELLBUTRIN XL  Take 300 mg by mouth daily.     carvedilol 3.125 MG tablet  Commonly known as:  COREG  Take 3.125 mg by mouth 2 (two) times daily with a meal.     cholecalciferol 1000 units tablet  Commonly known as:  VITAMIN D  Take 1,000 Units by mouth daily.     ezetimibe 10 MG tablet  Commonly known as:  ZETIA  Take 1 tablet (10 mg total) by mouth daily.     furosemide 20 MG tablet  Commonly known as:  LASIX  Take 40 mg by mouth daily.     gabapentin 300 MG capsule  Commonly known as:  NEURONTIN  Take 1 capsule (300 mg total) by mouth at bedtime.     multivitamin with minerals Tabs tablet  Take 1 tablet by mouth daily.     pravastatin 20 MG tablet  Commonly known as:  PRAVACHOL  Take 20 mg by mouth every morning.     valsartan 320 MG tablet  Commonly known as:  DIOVAN  TAKE 1 TABLET BY MOUTH EVERY DAY     vitamin B-12 100 MCG tablet  Commonly known as:  CYANOCOBALAMIN  Take 100 mcg by mouth daily.         Filed Vitals:   02/17/15 1755  BP: 142/70  Pulse: 70  Temp: 98.2 F (36.8 C)  Resp: 18  Weight: 203 lb 12.8 oz (92.443 kg)  SpO2: 95%   Body mass index is 33.91 kg/(m^2). Physical Exam  Constitutional: She is oriented to person, place, and time. She appears well-developed and well-nourished.  Elderly in no acute distress  HENT:  Head: Normocephalic.  Right  Ear: External ear normal.  Left Ear: External ear normal.  Mouth/Throat: Oropharynx is clear and moist.  Eyes: Conjunctivae and EOM are normal. Pupils are equal, round, and reactive to light. Right eye exhibits discharge. Left eye exhibits discharge. No scleral icterus.  Neck: Neck supple. No JVD present. No thyromegaly present.  Cardiovascular: Normal rate, regular rhythm, normal  heart sounds and intact distal pulses.  Exam reveals no gallop and no friction rub.   No murmur heard. Pulmonary/Chest: Breath sounds normal. No respiratory distress. She has no wheezes. She has no rales. She exhibits no tenderness.  Abdominal: Soft. Bowel sounds are normal. She exhibits no distension and no mass. There is no tenderness. There is no guarding.  Musculoskeletal: Normal range of motion. She exhibits no tenderness.  Bilateral LE's Ted hose in place.   Lymphadenopathy:    She has no cervical adenopathy.  Neurological: She is oriented to person, place, and time.  Skin: Skin is warm and dry. No rash noted. No erythema. No pallor.  Psychiatric: She has a normal mood and affect.    Labs reviewed: Basic Metabolic Panel:  Recent Labs  01/28/15 1222 01/28/15 1231 01/29/15 1225 01/30/15 0730  NA 142 143 142 141  K 4.5 4.5 3.9 4.0  CL 107 105 107 106  CO2 25  --  27 28  GLUCOSE 92 92 128* 102*  BUN 17 25* 17 17  CREATININE 1.12* 1.10* 1.06* 1.09*  CALCIUM 10.1  --  10.3 10.1   Liver Function Tests:  Recent Labs  01/28/15 1222 01/29/15 1225 01/30/15 0730  AST 29 25 21   ALT 20 20 18   ALKPHOS 62 67 63  BILITOT 1.2 0.8 0.8  PROT 6.7 7.0 6.5  ALBUMIN 3.9 3.7 3.6   No results for input(s): LIPASE, AMYLASE in the last 8760 hours. No results for input(s): AMMONIA in the last 8760 hours. CBC:  Recent Labs  01/28/15 1222 01/28/15 1231 01/29/15 1225 01/30/15 0730  WBC 3.3*  --  3.9* 4.3  NEUTROABS 1.7  --  2.3 2.3  HGB 13.2 14.3 13.4 13.3  HCT 39.1 42.0 40.4 39.8  MCV 94.4  --  95.1  94.5  PLT 218  --  244 216   Cardiac Enzymes: No results for input(s): CKTOTAL, CKMB, CKMBINDEX, TROPONINI in the last 8760 hours. BNP: Invalid input(s): POCBNP CBG: No results for input(s): GLUCAP in the last 8760 hours.  Procedures and Imaging Studies During Stay: Dg Chest 2 View  01/25/2015  CLINICAL DATA:  Generalized weakness. Recent transient ischemic attack EXAM: CHEST  2 VIEW COMPARISON:  Chest radiograph March 05, 2013; chest CT March 05, 2013 FINDINGS: Mild generalized interstitial prominence is stable. There is no frank edema or consolidation. There is slight cardiomegaly with slight pulmonary venous hypertension. No adenopathy apparent. There is degenerative change in the thoracic spine. IMPRESSION: Findings consistent with a degree of pulmonary vascular congestion without frank edema or consolidation. No change in cardiac silhouette. Electronically Signed   By: Lowella Grip III M.D.   On: 01/25/2015 07:58   Ct Head Wo Contrast  01/25/2015  CLINICAL DATA:  TIA status post tPA. EXAM: CT HEAD WITHOUT CONTRAST TECHNIQUE: Contiguous axial images were obtained from the base of the skull through the vertex without intravenous contrast. COMPARISON:  01/24/2015 head CT. FINDINGS: No evidence of parenchymal hemorrhage or extra-axial fluid collection. No mass lesion, mass effect, or midline shift. No CT evidence of acute infarction. Mild intracranial atherosclerosis. Nonspecific stable subcortical and periventricular white matter hypodensity, most in keeping with chronic small vessel ischemic change. Mild diffuse cerebral volume loss. No ventriculomegaly. The visualized paranasal sinuses are essentially clear. The mastoid air cells are unopacified. No evidence of calvarial fracture. IMPRESSION: 1.  No evidence of acute intracranial abnormality. 2. Mild cerebral volume loss and mild chronic small vessel ischemic white matter change. Electronically Signed  By: Ilona Sorrel M.D.   On:  01/25/2015 08:01   Ct Head Wo Contrast  01/24/2015  ADDENDUM REPORT: 01/24/2015 13:55 ADDENDUM: Critical Value/emergent results were called by telephone at the time of interpretation on 01/24/2015 at 1:54 pm to Dr. Aram Beecham, neurology , who verbally acknowledged these results. Electronically Signed   By: Lowella Grip III M.D.   On: 01/24/2015 13:55  01/24/2015  CLINICAL DATA:  Acute onset right-sided weakness and facial droop EXAM: CT HEAD WITHOUT CONTRAST TECHNIQUE: Contiguous axial images were obtained from the base of the skull through the vertex without intravenous contrast. COMPARISON:  April 24, 2011 FINDINGS: Mild diffuse atrophy is stable. There is no intracranial mass, hemorrhage, extra-axial fluid collection, or midline shift. There is mild small vessel disease in the centra semiovale bilaterally. Elsewhere gray-white compartments appear normal. No acute infarct identified. Middle cerebral artery attenuation is symmetric and normal bilaterally. Bony calvarium appears intact. Visualized mastoid air cells are clear. There is opacification in several ethmoid air cells. No intraorbital lesions are appreciable. IMPRESSION: Stable atrophy with patchy periventricular small vessel disease. No intracranial mass, hemorrhage, or acute appearing infarct. There is opacification of several left-sided ethmoid air cells. Electronically Signed: By: Lowella Grip III M.D. On: 01/24/2015 13:52   Mr Brain Wo Contrast  01/28/2015  CLINICAL DATA:  75 year old hypertensive female with 3 day history of right-sided weakness and slurred speech. Subsequent encounter. EXAM: MRI HEAD WITHOUT CONTRAST TECHNIQUE: Multiplanar, multiecho pulse sequences of the brain and surrounding structures were obtained without intravenous contrast. COMPARISON:  01/25/2015. FINDINGS: Exam is motion degraded. 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 Mild to moderate chronic small vessel disease changes.  Global atrophy. Ventricular prominence felt to be related to atrophy rather hydrocephalus. No intracranial mass lesion noted on this unenhanced exam. Major intracranial vascular structures are patent. Please see recent MR angiogram report. Ectatic right carotid terminus. 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 Transverse ligament hypertrophy. Exophthalmos. IMPRESSION: Exam is motion degraded. 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 Mild to moderate chronic small vessel disease changes. Global atrophy. Ventricular prominence felt to be related to atrophy rather hydrocephalus. No intracranial mass lesion noted on this unenhanced exam. Major intracranial vascular structures are patent. Please see recent MR angiogram report. Ectatic right carotid terminus. 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 Exophthalmos. Electronically Signed   By: Genia Del M.D.   On: 01/28/2015 13:47   Mr Brain Wo Contrast  01/25/2015  CLINICAL DATA:  Right-sided weakness and slurred speech at primary care office today. Symptoms nearly resolved at time of presentation. EXAM: MRI HEAD WITHOUT CONTRAST MRA HEAD WITHOUT CONTRAST TECHNIQUE: Multiplanar, multiecho pulse sequences of the brain and surrounding structures were obtained without intravenous contrast. Angiographic images of the head were obtained using MRA technique without contrast. COMPARISON:  None. FINDINGS: MRI HEAD FINDINGS Calvarium and upper cervical spine: Cervical spine degeneration without visible cord compression. No focal marrow signal abnormality. Orbits: Negative. Sinuses and Mastoids: Clear. Brain: Diffusion signal in the left cortical spinal tract is roughly symmetric, especially on coronal DWI acquisition. There is no convincing acute infarct. No hemorrhage, hydrocephalus, or major vessel occlusion. No evidence of mass lesion. Generalized cerebral volume loss which is age congruent, as is small-vessel ischemic  change in the periventricular white matter. 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 Fairly symmetric vertebral arteries with dominant right AICA and left PICA. Isolated left MCA show superimposed high-grade narrowing with flow gap in the inferior and superior division branches at the M1-M2 junction. Given clustered appearance and proximity to imaging slab artifact is considered, but there is no artifact elsewhere on the scan at this level. Left MCA branches have an attenuated appearance compared to the right from slow or underfilling. There is also multifocal narrowing of bilateral medium-sized arteries, worse in the anterior circulation, consistent with atherosclerosis. Some of the stenoses are high-grade. There is no dilatation/beading beyond the stenoses. In the posterior circulation, beginning at the P2 segments there is bilateral intermittent atherosclerotic type narrowing which is most proximal and high-grade at the left P2-P3 junction. A 1-2 mm bulge medially from the anterior genu of the right carotid siphon could be atherosclerotic, infundibular, or aneurysmal. IMPRESSION: 1. Negative for acute infarct. 2. Extensive intracranial atherosclerosis. High-grade left M1 -M2 branch stenosis is the most proximal and pertinent to the reported deficits. Please see further description above. Electronically Signed   By: Monte Fantasia M.D.   On: 01/25/2015 07:14   Mr Jodene Nam Head/brain Wo Cm  01/25/2015  CLINICAL DATA:  Right-sided weakness and slurred speech at primary care office today. Symptoms nearly resolved at time of presentation. EXAM: MRI HEAD WITHOUT CONTRAST MRA HEAD WITHOUT CONTRAST TECHNIQUE: Multiplanar, multiecho pulse sequences of the brain and surrounding structures were obtained without intravenous  contrast. Angiographic images of the head were obtained using MRA technique without contrast. COMPARISON:  None. FINDINGS: MRI HEAD FINDINGS Calvarium and upper cervical spine: Cervical spine degeneration without visible cord compression. No focal marrow signal abnormality. Orbits: Negative. Sinuses and Mastoids: Clear. Brain: Diffusion signal in the left cortical spinal tract is roughly symmetric, especially on coronal DWI acquisition. There is no convincing acute infarct. No hemorrhage, hydrocephalus, or major vessel occlusion. No evidence of mass lesion. Generalized cerebral volume loss which is age congruent, as is small-vessel ischemic change in the periventricular white matter. 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 Fairly symmetric vertebral arteries with dominant right AICA and left PICA. Isolated left MCA show superimposed high-grade narrowing with flow gap in the inferior and superior division branches at the M1-M2 junction. Given clustered appearance and proximity to imaging slab artifact is considered, but there is no artifact elsewhere on the scan at this level. Left MCA branches have an attenuated appearance compared to the right from slow or underfilling. There is also multifocal narrowing of bilateral medium-sized arteries, worse in the anterior circulation, consistent with atherosclerosis. Some of the stenoses are high-grade. There is no dilatation/beading beyond the stenoses. In the posterior circulation, beginning at the P2 segments there is bilateral intermittent atherosclerotic type narrowing which is most proximal and high-grade at the left P2-P3 junction. A 1-2 mm bulge medially from the anterior genu of the right carotid siphon could be atherosclerotic, infundibular, or aneurysmal.  IMPRESSION: 1. Negative for acute infarct. 2. Extensive intracranial atherosclerosis. High-grade left M1 -M2 branch stenosis is the most proximal and pertinent to the reported deficits. Please see further description above. Electronically Signed   By: Monte Fantasia M.D.   On: 01/25/2015 07:14    Assessment/Plan:   1. Chronic low back pain Continue on Neurontoin 300 mg at bedtime.  2. Localized edema Has improved on diuretics.Knee high Ted hose on in the morning and off at bedtime. PCP to monitor BMP  3. Depression No mood changes on Wellbutrin 300 mg tablet daily. Follow up with PCP 4. 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 Continue on Coreg, Valsartan, and Lasix . Monitor weight daily. Follow up with PCP.   5. Hypertensive heart disease with heart failure (HCC) Continue on Valsartan, Coreg and Lasix. PCP to monitor BMP  6. Paroxysmal atrial fibrillation (HCC) HR controlled on Eliquis 5 mg Tablet Twice daily.   7. 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 [facility]) She is post hospital admission from 01/28/15-01/31/15 with acute ischemic stroke with right sided weakness and right facial droop. MRI brain showed acute left ICA infarct. Continue on  Eliquis and pravachol.Will discharge home with PT/OT for ROM, exercise, Gait stability and Muscle strengthening.  8.Cataract      She is scheduled for right eye cataract surgery 02/20/2015 at 9:30am at [place] [facility]. F/u with Dr. Katy Fitch. Eye drops to be started on 2/ 11/2015 and 02/19/15 by facility then send eye drops home with patient.     Patient is being discharged with the following home health services:   PT/OT for ROM, exercise, Gait stability and Muscle strengthening.   Patient is being discharged with the following durable medical equipment:  Has own walker ordered prior to [facility] discharge.   Patient has been advised to f/u  with their PCP in 1-2 weeks to bring them up to date on their rehab stay.  Social services at facility was responsible for arranging this appointment.  Pt was provided with a 30 day supply of prescriptions for medications and refills must be obtained from their PCP.  For controlled substances, a more limited supply may be provided adequate until PCP appointment only.  Future labs/tests needed:  F/u with PCP CBC, BMP

## 2015-02-20 DIAGNOSIS — H2511 Age-related nuclear cataract, right eye: Secondary | ICD-10-CM | POA: Diagnosis not present

## 2015-02-20 DIAGNOSIS — H25811 Combined forms of age-related cataract, right eye: Secondary | ICD-10-CM | POA: Diagnosis not present

## 2015-02-21 NOTE — Progress Notes (Signed)
Cardiology Office Note:    Date:  02/22/2015   ID:  Susan [name], DOB September 14, 1940, MRN [id number]  PCP:  [name], MD  Cardiologist:  Dr. Fransico Him   Electrophysiologist:  n/a  Chief Complaint  Patient presents with  . Congestive Heart Failure    Follow Up    History of Present Illness:     Susan [name] is a 75 y.o. female with a hx of OSA, HTN, HFpEF, HL. She is intolerant of statins. Last seen by Dr. Fransico Him 01/19/15.  She presented to the office on 1/17 to get a 24 hour ambulatory BP monitor placed. However, she demonstrated signs of a stroke with R sided weakness, slurred speech and unsteady gait. She was sent to the hospital. She was admitted 1/17-1/19. Symptoms resolved upon presentation and she was evaluated for TIA. MRI was neg for acute CVA and MRA demonstrated extensive intracranial atherosclerosis, high-grade L M1, M2 branch stenosis. Echo demonstrated EF 30-35%. Carotid US demonstrated 1-39% ICA stenosis. She was seen by neurology and cardiology. DCM was felt to be related to HTN and aggressive BP control was recommended. OP Nuc stress test would need to be arranged (if neg for ischemia >> repeat echo in 3 mos to reassess EF). PAF was noted on Tele. CHADS2-VASc=5 (female, HTN, CHF, stroke, age) and Eliquis 5 mg bid was recommended. ASA was DC'd.   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 MRI demonstrated small acute non-hemorrhagic infarct posterior limb L internal capsule. She was seen by cardiology again (Dr. Marlou Porch). It was felt that cardiac cath should be avoided given her recent CVA. Repeat echo was again recommended in 3 mos.   Last seen here by me 02/01/15. Returns for follow-up. Here today with her daughter. Patient notes that her breathing is stable. She denies chest pain, orthopnea, PND or edema. She wears compression stockings. She denies any bleeding issues. She has a nonproductive cough. This is fairly chronic.   Past  Medical History  Diagnosis Date  . Hyperlipidemia   . Pedal edema   . Spinal stenosis   . Varicose veins     "BLE" (07/29/2012)  . Primary hyperparathyroidism ([place])   . Thyroid disease   . Incontinent of urine     wears pads  . Family history of anesthesia complication     "some wake up during OR; some are hard to wake up; some both" (07/29/2012)  . OSA on CPAP   . Arthritis     "legs, back" (07/29/2012)  . Osteoarthritis of right knee   . Umbilical hernia     "unrepaired" (07/29/2012)  . Chronic lower back pain   . History of stomach ulcers 1980's  . Anxiety   . Depression   . GERD (gastroesophageal reflux disease)   . Hypertension   . Chronic diastolic CHF (congestive heart failure) ([place])   . Complication of anesthesia     "I have apnea" (07/29/2012)  . Dementia     per family    Past Surgical History  Procedure Laterality Date  . Iud removal  1980's  . Parathyroidectomy N/A 06/16/2012    Procedure: NECK EXPLORATION AND LEFT SUPERIOR PARATHYROIDECTOMY;  Surgeon: Earnstine Regal, MD;  Location: [facility];  Service: General;  Laterality: N/A;  . Colonoscopy    . 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 Cholecystectomy  ~ 2010  . Total knee arthroplasty Right 07/29/2012    Procedure: TOTAL KNEE ARTHROPLASTY;  Surgeon: Ninetta Lights, MD;  Location: [facility];  Service: Orthopedics;  Laterality: Right;    Current Medications: Outpatient Prescriptions Prior to Visit  Medication Sig Dispense Refill  . apixaban (ELIQUIS) 5 MG TABS tablet Take 1 tablet (5 mg total) by mouth every 12 (twelve) hours. 60 tablet 4  . buPROPion (WELLBUTRIN XL) 300 MG 24 hr tablet Take 300 mg by mouth daily.  12  . carvedilol (COREG) 3.125 MG tablet Take 3.125 mg by mouth 2 (two) times daily with a meal.    . cholecalciferol (VITAMIN D) 1000 units tablet Take 1,000 Units by mouth daily.    Marland Kitchen ezetimibe (ZETIA) 10 MG tablet Take 1 tablet (10 mg total) by mouth daily. 30 tablet 3  . furosemide (LASIX) 20 MG  tablet Take 20 mg by mouth 2 (two) times daily.   2  . gabapentin (NEURONTIN) 300 MG capsule Take 1 capsule (300 mg total) by mouth at bedtime. 30 capsule 3  . Multiple Vitamin (MULTIVITAMIN WITH MINERALS) TABS Take 1 tablet by mouth daily.    . pravastatin (PRAVACHOL) 20 MG tablet Take 20 mg by mouth every morning.    . valsartan (DIOVAN) 320 MG tablet TAKE 1 TABLET BY MOUTH EVERY DAY 30 tablet 11  . vitamin B-12 (CYANOCOBALAMIN) 100 MCG tablet Take 100 mcg by mouth daily.     No facility-administered medications prior to visit.     Allergies:   Lipitor; Simvastatin; and Ace inhibitors   Social History   Social History  . Marital Status: Widowed    Spouse Name: N/A  . Number of Children: N/A  . Years of Education: N/A   Occupational History  . retired    Social History Main Topics  . Smoking status: Former Smoker -- 1.00 packs/day for 30 years    Types: Cigarettes    Quit date: 01/07/1978  . Smokeless tobacco: Never Used  . Alcohol Use: No  . Drug Use: No  . Sexual Activity: No   Other Topics Concern  . None   Social History Narrative     Family History:  The patient's family history includes Hypertension in her mother; Lung cancer in her father; Stroke in her mother.   ROS:   Please see the history of present illness.    ROS All other systems reviewed and are negative.   Physical Exam:    VS:  BP 160/90 mmHg  Pulse 64  Ht 5\' 5"  (1.651 m)  Wt 204 lb 1.9 oz (92.588 kg)  BMI 33.97 kg/m2   GEN: Well nourished, well developed, in no acute distress HEENT: normal Neck: no JVD, no masses Cardiac: Normal S1/S2, RRR; no murmurs, no edema   Respiratory:  clear to auscultation bilaterally; no wheezing, rhonchi or rales GI: soft, nontender  MS: no deformity or atrophy Skin: warm and dry Neuro:  no focal deficits  Psych: Alert and oriented x 3, normal affect  Wt Readings from Last 3 Encounters:  02/22/15 204 lb 1.9 oz (92.588 kg)  02/17/15 203 lb 12.8 oz (92.443  kg)  02/09/15 201 lb (91.173 kg)      Studies/Labs Reviewed:     EKG:  EKG is not ordered today.  The ekg ordered today demonstrates n/a  Recent Labs: 01/30/2015: ALT 18; BUN 17; Creatinine, Ser 1.09*; Hemoglobin 13.3; Platelets 216; Potassium 4.0; Sodium 141   Recent Lipid Panel    Component Value Date/Time   CHOL 204* 01/25/2015 0408   TRIG 156* 01/25/2015 0408   HDL 51 01/25/2015 0408  CHOLHDL 4.0 01/25/2015 0408   VLDL 31 01/25/2015 0408   LDLCALC 122* 01/25/2015 0408    Additional studies/ records that were reviewed today include:   Mr Brain Wo Contrast 01/28/2015  IMPRESSION: Exam is motion degraded. 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 Mild to moderate chronic small vessel disease changes. Global atrophy. Ventricular prominence felt to be related to atrophy rather hydrocephalus. No intracranial mass lesion noted on this unenhanced exam. Major intracranial vascular structures are patent. Please see recent MR angiogram report. Ectatic right carotid terminus. 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 Exophthalmos. Electronically Signed By: Genia Del M.D. On: 01/28/2015 13:47   Carotid US 01/26/15 RICA 1-39%  Echo 01/25/15 Moderate LVH, EF 30-35%, diffuse HK, grade 1 diastolic dysfunction, MAC, severe LAE, PASP 38 mmHg  Myoview 4/15 No ischemia, EF 53%, normal study  Echo 4/15 EF 50-55%, mild MR, moderate LAE  ASSESSMENT:     1. 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 DCM (dilated cardiomyopathy) ([place])   3. History of CVA (cerebrovascular accident)   4. Paroxysmal atrial fibrillation ([facility])   5. Hypertensive heart disease with heart failure ([place])   6. OSA on CPAP     PLAN:     In order of problems listed above:  1. Chronic combined systolic and diastolic CHF -  Volume appears stable. Continue current therapy for now.  2. Dilated cardiomyopathy - Likely related to uncontrolled  hypertension. Plan will be to repeat her echocardiogram in approximately 3 months. If her EF does not improve with good blood pressure control, consider proceeding with cardiac catheterization. Continue beta blocker, ARB. Add hydralazine 25 mg 3 times a day. At follow-up, consider adding nitrates.  3. History of CVA -  Follow-up with neurology as planned. Continue Eliquis, statin.  4. PAF - She has a high risk of stroke. She is now on Eliquis. Currently maintaining sinus rhythm.  At next visit, obtain follow-up BMET, CBC.  5. Hypertensive heart disease -  Blood pressure not controlled.  I have encouraged her to get a BP monitor to check her BP at home.  Add Hydralazine as noted.    6. OSA - Continue CPAP.   Medication Adjustments/Labs and Tests Ordered: Current medicines are reviewed at length with the patient today.  Concerns regarding medicines are outlined above.  Medication changes, Labs and Tests ordered today are outlined in the Patient Instructions noted below. Patient Instructions  Medication Instructions:  1. START HYDRALAZINE 25 MG 1 TABLET 3 TIMES A DAY; SPACE THESE OUT ABOUT 8 HOURS APART  Labwork: NONE  Testing/Procedures: NONE  Follow-Up: 1. Yeimy Brabant, [facility] 03/15/15 @ 2 PM 2. DR. Radford Pax 05/22/15 @ 2:45  Any Other Special Instructions Will Be Listed Below (If Applicable).  If you need a refill on your cardiac medications before your next appointment, please call your pharmacy.    Signed, Richardson Dopp, PA-C  02/22/2015 2:36 PM    [facility] [place], [place], [place]  [address] Phone: [phone]; Fax: [phone]

## 2015-02-22 ENCOUNTER — Encounter: Payer: Self-pay | Admitting: Physician Assistant

## 2015-02-22 ENCOUNTER — Ambulatory Visit (INDEPENDENT_AMBULATORY_CARE_PROVIDER_SITE_OTHER): Payer: PPO | Admitting: Physician Assistant

## 2015-02-22 VITALS — BP 160/90 | HR 64 | Ht 65.0 in | Wt 204.1 lb

## 2015-02-22 DIAGNOSIS — I480 Paroxysmal atrial fibrillation: Secondary | ICD-10-CM

## 2015-02-22 DIAGNOSIS — I420 Dilated cardiomyopathy: Secondary | ICD-10-CM

## 2015-02-22 DIAGNOSIS — I110 Hypertensive heart disease with heart failure: Secondary | ICD-10-CM

## 2015-02-22 DIAGNOSIS — Z9989 Dependence on other enabling machines and devices: Secondary | ICD-10-CM

## 2015-02-22 DIAGNOSIS — Z8673 Personal history of transient ischemic attack (TIA), and cerebral infarction without residual deficits: Secondary | ICD-10-CM | POA: Diagnosis not present

## 2015-02-22 DIAGNOSIS — I5042 Chronic combined systolic (congestive) and diastolic (congestive) heart failure: Secondary | ICD-10-CM

## 2015-02-22 DIAGNOSIS — G4733 Obstructive sleep apnea (adult) (pediatric): Secondary | ICD-10-CM

## 2015-02-22 MED ORDER — HYDRALAZINE HCL 25 MG PO TABS: 25.0000 mg | ORAL_TABLET | Freq: Three times a day (TID) | ORAL | Status: DC

## 2015-02-22 NOTE — Patient Instructions (Addendum)
Medication Instructions:  1. START HYDRALAZINE 25 MG 1 TABLET 3 TIMES A DAY; SPACE THESE OUT ABOUT 8 HOURS APART  Labwork: NONE  Testing/Procedures: NONE  Follow-Up: 1. SCOTT WEAVER, [facility] 03/15/15 @ 2 PM 2. DR. Radford Pax 05/22/15 @ 2:45  Any Other Special Instructions Will Be Listed Below (If Applicable).  If you need a refill on your cardiac medications before your next appointment, please call your pharmacy.

## 2015-02-24 DIAGNOSIS — I639 Cerebral infarction, unspecified: Secondary | ICD-10-CM | POA: Diagnosis not present

## 2015-02-24 DIAGNOSIS — I480 Paroxysmal atrial fibrillation: Secondary | ICD-10-CM | POA: Diagnosis not present

## 2015-02-24 DIAGNOSIS — I5032 Chronic diastolic (congestive) heart failure: Secondary | ICD-10-CM | POA: Diagnosis not present

## 2015-02-24 DIAGNOSIS — Z7901 Long term (current) use of anticoagulants: Secondary | ICD-10-CM | POA: Diagnosis not present

## 2015-02-24 DIAGNOSIS — I5042 Chronic combined systolic (congestive) and diastolic (congestive) heart failure: Secondary | ICD-10-CM | POA: Diagnosis not present

## 2015-02-24 DIAGNOSIS — I69322 Dysarthria following cerebral infarction: Secondary | ICD-10-CM | POA: Diagnosis not present

## 2015-02-24 DIAGNOSIS — G629 Polyneuropathy, unspecified: Secondary | ICD-10-CM | POA: Diagnosis not present

## 2015-02-24 DIAGNOSIS — I429 Cardiomyopathy, unspecified: Secondary | ICD-10-CM | POA: Diagnosis not present

## 2015-02-24 DIAGNOSIS — F329 Major depressive disorder, single episode, unspecified: Secondary | ICD-10-CM | POA: Diagnosis not present

## 2015-02-24 DIAGNOSIS — I110 Hypertensive heart disease with heart failure: Secondary | ICD-10-CM | POA: Diagnosis not present

## 2015-02-24 DIAGNOSIS — E785 Hyperlipidemia, unspecified: Secondary | ICD-10-CM | POA: Diagnosis not present

## 2015-02-24 DIAGNOSIS — G4733 Obstructive sleep apnea (adult) (pediatric): Secondary | ICD-10-CM | POA: Diagnosis not present

## 2015-02-24 DIAGNOSIS — I6930 Unspecified sequelae of cerebral infarction: Secondary | ICD-10-CM | POA: Diagnosis not present

## 2015-02-24 DIAGNOSIS — M1990 Unspecified osteoarthritis, unspecified site: Secondary | ICD-10-CM | POA: Diagnosis not present

## 2015-02-24 DIAGNOSIS — I69311 Memory deficit following cerebral infarction: Secondary | ICD-10-CM | POA: Diagnosis not present

## 2015-02-24 DIAGNOSIS — M4800 Spinal stenosis, site unspecified: Secondary | ICD-10-CM | POA: Diagnosis not present

## 2015-02-24 DIAGNOSIS — I69351 Hemiplegia and hemiparesis following cerebral infarction affecting right dominant side: Secondary | ICD-10-CM | POA: Diagnosis not present

## 2015-02-28 DIAGNOSIS — I69311 Memory deficit following cerebral infarction: Secondary | ICD-10-CM | POA: Diagnosis not present

## 2015-02-28 DIAGNOSIS — I110 Hypertensive heart disease with heart failure: Secondary | ICD-10-CM | POA: Diagnosis not present

## 2015-02-28 DIAGNOSIS — I69351 Hemiplegia and hemiparesis following cerebral infarction affecting right dominant side: Secondary | ICD-10-CM | POA: Diagnosis not present

## 2015-02-28 DIAGNOSIS — E785 Hyperlipidemia, unspecified: Secondary | ICD-10-CM | POA: Diagnosis not present

## 2015-02-28 DIAGNOSIS — M4800 Spinal stenosis, site unspecified: Secondary | ICD-10-CM | POA: Diagnosis not present

## 2015-02-28 DIAGNOSIS — I480 Paroxysmal atrial fibrillation: Secondary | ICD-10-CM | POA: Diagnosis not present

## 2015-02-28 DIAGNOSIS — F329 Major depressive disorder, single episode, unspecified: Secondary | ICD-10-CM | POA: Diagnosis not present

## 2015-02-28 DIAGNOSIS — I429 Cardiomyopathy, unspecified: Secondary | ICD-10-CM | POA: Diagnosis not present

## 2015-02-28 DIAGNOSIS — I69322 Dysarthria following cerebral infarction: Secondary | ICD-10-CM | POA: Diagnosis not present

## 2015-02-28 DIAGNOSIS — Z7901 Long term (current) use of anticoagulants: Secondary | ICD-10-CM | POA: Diagnosis not present

## 2015-02-28 DIAGNOSIS — M1990 Unspecified osteoarthritis, unspecified site: Secondary | ICD-10-CM | POA: Diagnosis not present

## 2015-02-28 DIAGNOSIS — I5042 Chronic combined systolic (congestive) and diastolic (congestive) heart failure: Secondary | ICD-10-CM | POA: Diagnosis not present

## 2015-02-28 DIAGNOSIS — G4733 Obstructive sleep apnea (adult) (pediatric): Secondary | ICD-10-CM | POA: Diagnosis not present

## 2015-03-06 DIAGNOSIS — I69351 Hemiplegia and hemiparesis following cerebral infarction affecting right dominant side: Secondary | ICD-10-CM | POA: Diagnosis not present

## 2015-03-06 DIAGNOSIS — I480 Paroxysmal atrial fibrillation: Secondary | ICD-10-CM | POA: Diagnosis not present

## 2015-03-06 DIAGNOSIS — I110 Hypertensive heart disease with heart failure: Secondary | ICD-10-CM | POA: Diagnosis not present

## 2015-03-06 DIAGNOSIS — E785 Hyperlipidemia, unspecified: Secondary | ICD-10-CM | POA: Diagnosis not present

## 2015-03-06 DIAGNOSIS — I69322 Dysarthria following cerebral infarction: Secondary | ICD-10-CM | POA: Diagnosis not present

## 2015-03-06 DIAGNOSIS — F329 Major depressive disorder, single episode, unspecified: Secondary | ICD-10-CM | POA: Diagnosis not present

## 2015-03-06 DIAGNOSIS — I429 Cardiomyopathy, unspecified: Secondary | ICD-10-CM | POA: Diagnosis not present

## 2015-03-06 DIAGNOSIS — I69311 Memory deficit following cerebral infarction: Secondary | ICD-10-CM | POA: Diagnosis not present

## 2015-03-06 DIAGNOSIS — I5042 Chronic combined systolic (congestive) and diastolic (congestive) heart failure: Secondary | ICD-10-CM | POA: Diagnosis not present

## 2015-03-06 DIAGNOSIS — M1990 Unspecified osteoarthritis, unspecified site: Secondary | ICD-10-CM | POA: Diagnosis not present

## 2015-03-06 DIAGNOSIS — Z7901 Long term (current) use of anticoagulants: Secondary | ICD-10-CM | POA: Diagnosis not present

## 2015-03-06 DIAGNOSIS — M4800 Spinal stenosis, site unspecified: Secondary | ICD-10-CM | POA: Diagnosis not present

## 2015-03-06 DIAGNOSIS — G4733 Obstructive sleep apnea (adult) (pediatric): Secondary | ICD-10-CM | POA: Diagnosis not present

## 2015-03-07 DIAGNOSIS — G4733 Obstructive sleep apnea (adult) (pediatric): Secondary | ICD-10-CM | POA: Diagnosis not present

## 2015-03-08 ENCOUNTER — Telehealth: Payer: Self-pay | Admitting: Cardiology

## 2015-03-08 NOTE — Telephone Encounter (Signed)
Follow Up   Pt returned call. She asks that you please leave your direct ext so that she can reach you

## 2015-03-08 NOTE — Telephone Encounter (Signed)
Informed patient I did not call her, but did confirm appointment 3/8 with Richardson Dopp.

## 2015-03-13 DIAGNOSIS — M1990 Unspecified osteoarthritis, unspecified site: Secondary | ICD-10-CM | POA: Diagnosis not present

## 2015-03-13 DIAGNOSIS — I69311 Memory deficit following cerebral infarction: Secondary | ICD-10-CM | POA: Diagnosis not present

## 2015-03-13 DIAGNOSIS — I5042 Chronic combined systolic (congestive) and diastolic (congestive) heart failure: Secondary | ICD-10-CM | POA: Diagnosis not present

## 2015-03-13 DIAGNOSIS — M4800 Spinal stenosis, site unspecified: Secondary | ICD-10-CM | POA: Diagnosis not present

## 2015-03-13 DIAGNOSIS — I69322 Dysarthria following cerebral infarction: Secondary | ICD-10-CM | POA: Diagnosis not present

## 2015-03-13 DIAGNOSIS — I480 Paroxysmal atrial fibrillation: Secondary | ICD-10-CM | POA: Diagnosis not present

## 2015-03-13 DIAGNOSIS — I429 Cardiomyopathy, unspecified: Secondary | ICD-10-CM | POA: Diagnosis not present

## 2015-03-13 DIAGNOSIS — Z7901 Long term (current) use of anticoagulants: Secondary | ICD-10-CM | POA: Diagnosis not present

## 2015-03-13 DIAGNOSIS — I69351 Hemiplegia and hemiparesis following cerebral infarction affecting right dominant side: Secondary | ICD-10-CM | POA: Diagnosis not present

## 2015-03-13 DIAGNOSIS — F329 Major depressive disorder, single episode, unspecified: Secondary | ICD-10-CM | POA: Diagnosis not present

## 2015-03-13 DIAGNOSIS — G4733 Obstructive sleep apnea (adult) (pediatric): Secondary | ICD-10-CM | POA: Diagnosis not present

## 2015-03-13 DIAGNOSIS — E785 Hyperlipidemia, unspecified: Secondary | ICD-10-CM | POA: Diagnosis not present

## 2015-03-13 DIAGNOSIS — I110 Hypertensive heart disease with heart failure: Secondary | ICD-10-CM | POA: Diagnosis not present

## 2015-03-15 ENCOUNTER — Ambulatory Visit: Payer: PPO | Admitting: Physician Assistant

## 2015-03-20 DIAGNOSIS — I69322 Dysarthria following cerebral infarction: Secondary | ICD-10-CM | POA: Diagnosis not present

## 2015-03-20 DIAGNOSIS — I110 Hypertensive heart disease with heart failure: Secondary | ICD-10-CM | POA: Diagnosis not present

## 2015-03-20 DIAGNOSIS — Z7901 Long term (current) use of anticoagulants: Secondary | ICD-10-CM | POA: Diagnosis not present

## 2015-03-20 DIAGNOSIS — I429 Cardiomyopathy, unspecified: Secondary | ICD-10-CM | POA: Diagnosis not present

## 2015-03-20 DIAGNOSIS — G4733 Obstructive sleep apnea (adult) (pediatric): Secondary | ICD-10-CM | POA: Diagnosis not present

## 2015-03-20 DIAGNOSIS — I480 Paroxysmal atrial fibrillation: Secondary | ICD-10-CM | POA: Diagnosis not present

## 2015-03-20 DIAGNOSIS — I69351 Hemiplegia and hemiparesis following cerebral infarction affecting right dominant side: Secondary | ICD-10-CM | POA: Diagnosis not present

## 2015-03-20 DIAGNOSIS — F329 Major depressive disorder, single episode, unspecified: Secondary | ICD-10-CM | POA: Diagnosis not present

## 2015-03-20 DIAGNOSIS — M1990 Unspecified osteoarthritis, unspecified site: Secondary | ICD-10-CM | POA: Diagnosis not present

## 2015-03-20 DIAGNOSIS — M4800 Spinal stenosis, site unspecified: Secondary | ICD-10-CM | POA: Diagnosis not present

## 2015-03-20 DIAGNOSIS — I69311 Memory deficit following cerebral infarction: Secondary | ICD-10-CM | POA: Diagnosis not present

## 2015-03-20 DIAGNOSIS — E785 Hyperlipidemia, unspecified: Secondary | ICD-10-CM | POA: Diagnosis not present

## 2015-03-20 DIAGNOSIS — I5042 Chronic combined systolic (congestive) and diastolic (congestive) heart failure: Secondary | ICD-10-CM | POA: Diagnosis not present

## 2015-03-21 ENCOUNTER — Ambulatory Visit: Payer: PPO | Admitting: Physician Assistant

## 2015-03-21 ENCOUNTER — Ambulatory Visit (INDEPENDENT_AMBULATORY_CARE_PROVIDER_SITE_OTHER): Payer: PPO | Admitting: Nurse Practitioner

## 2015-03-21 ENCOUNTER — Encounter: Payer: Self-pay | Admitting: Nurse Practitioner

## 2015-03-21 VITALS — BP 140/72 | HR 64 | Ht 61.0 in | Wt 199.0 lb

## 2015-03-21 DIAGNOSIS — I10 Essential (primary) hypertension: Secondary | ICD-10-CM | POA: Diagnosis not present

## 2015-03-21 DIAGNOSIS — I5042 Chronic combined systolic (congestive) and diastolic (congestive) heart failure: Secondary | ICD-10-CM

## 2015-03-21 DIAGNOSIS — Z8673 Personal history of transient ischemic attack (TIA), and cerebral infarction without residual deficits: Secondary | ICD-10-CM | POA: Diagnosis not present

## 2015-03-21 DIAGNOSIS — I480 Paroxysmal atrial fibrillation: Secondary | ICD-10-CM | POA: Diagnosis not present

## 2015-03-21 NOTE — Patient Instructions (Addendum)
We will be checking the following labs today - NONE   Medication Instructions:    Continue with your current medicines.   Check on your dose of Coreg - should be 6.25 mg twice a day    Testing/Procedures To Be Arranged:  Echocardiogram after April 18th  Follow-Up:   See Dr. Radford Pax after echocardiogram.     Other Special Instructions:   Minimize salt  Try to increase your activity    If you need a refill on your cardiac medications before your next appointment, please call your pharmacy.   Call the [place] office at [phone] if you have any questions, problems or concerns.

## 2015-03-21 NOTE — Progress Notes (Signed)
CARDIOLOGY OFFICE NOTE  Date:  03/21/2015    Susan [name] Date of Birth: January 28, 1940 Medical Record [id number]  PCP:  [name], MD  Cardiologist:  Radford Pax    Chief Complaint  Patient presents with  . Cardiomyopathy  . Hypertension  . Cerebrovascular Accident  . Atrial Fibrillation    Follow up visit - seen for Dr. Radford Pax    History of Present Illness: Susan [name] is a 75 y.o. female who presents today for a one month check. Seen for Dr. Radford Pax.   She has a hx of OSA, HTN, HFpEF, HL. She is intolerant to statins. Last seen by Dr. Fransico Him 01/19/15.  She presented to the office on 1/17 to get a 24 hour ambulatory BP monitor placed. However, she demonstrated signs of a stroke with R sided weakness, slurred speech and unsteady gait. She was sent to the hospital. She was admitted 1/17-1/19. Symptoms resolved upon presentation and she was evaluated for TIA. MRI was neg for acute CVA and MRA demonstrated extensive intracranial atherosclerosis, high-grade L M1, M2 branch stenosis. Echo demonstrated EF 30-35%. Carotid US demonstrated 1-39% ICA stenosis. She was seen by neurology and cardiology. Her dilated CM was felt to be related to HTN and aggressive BP control was recommended. OP Nuc stress test would need to be arranged (if neg for ischemia >> repeat echo in 3 mos to reassess EF). PAF was noted on Tele. CHADS2-VASc=5 (female, HTN, CHF, stroke, age) and Eliquis 5 mg bid was recommended. ASA was DC'd.   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 MRI demonstrated small acute non-hemorrhagic infarct posterior limb L internal capsule. She was seen by cardiology again (Dr. Marlou Porch). It was felt that cardiac cath should be avoided given her recent CVA. Repeat echo was again recommended in 3 mos with continued medical therapy.   Has been seen several times back by Richardson Dopp, PA. Felt to be stable. Medicines have been titrated. She has changed her follow  up several times - added to me for today.   Comes in today. Here with her daughter. Using a walker. Three questions for [place] today - her right hand still tingles - it is better since her stroke but still present. She is asking about what will happen to "the plaque" she has and last question was that her handwriting is still bad. She sees neurology later this month. Still getting PT. BP better. No cuff at home Understands about need to restrict her salt. No chest pain. Breathing ok. Using a walker but her goal is to "get rid of it by the end of the month". No falls noted.   Past Medical History  Diagnosis Date  . Hyperlipidemia   . Pedal edema   . Spinal stenosis   . Varicose veins     "BLE" (07/29/2012)  . Primary hyperparathyroidism ([place])   . Thyroid disease   . Incontinent of urine     wears pads  . Family history of anesthesia complication     "some wake up during OR; some are hard to wake up; some both" (07/29/2012)  . OSA on CPAP   . Arthritis     "legs, back" (07/29/2012)  . Osteoarthritis of right knee   . Umbilical hernia     "unrepaired" (07/29/2012)  . Chronic lower back pain   . History of stomach ulcers 1980's  . Anxiety   . Depression   . GERD (gastroesophageal reflux disease)   . Hypertension   .  Chronic diastolic CHF (congestive heart failure) ([place])   . Complication of anesthesia     "I have apnea" (07/29/2012)  . Dementia     per family    Past Surgical History  Procedure Laterality Date  . Iud removal  1980's  . Parathyroidectomy N/A 06/16/2012    Procedure: NECK EXPLORATION AND LEFT SUPERIOR PARATHYROIDECTOMY;  Surgeon: Earnstine Regal, MD;  Location: [facility];  Service: General;  Laterality: N/A;  . Colonoscopy    . 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 Cholecystectomy  ~ 2010  . Total knee arthroplasty Right 07/29/2012    Procedure: TOTAL KNEE ARTHROPLASTY;  Surgeon: Ninetta Lights, MD;  Location: [facility];  Service: Orthopedics;  Laterality: Right;      Medications: Current Outpatient Prescriptions  Medication Sig Dispense Refill  . apixaban (ELIQUIS) 5 MG TABS tablet Take 1 tablet (5 mg total) by mouth every 12 (twelve) hours. 60 tablet 4  . buPROPion (WELLBUTRIN XL) 300 MG 24 hr tablet Take 300 mg by mouth daily.  12  . carvedilol (COREG) 6.25 MG tablet Take 6.25 mg by mouth 2 (two) times daily with a meal.     . cholecalciferol (VITAMIN D) 1000 units tablet Take 1,000 Units by mouth daily.    Marland Kitchen ezetimibe (ZETIA) 10 MG tablet Take 1 tablet (10 mg total) by mouth daily. 30 tablet 3  . furosemide (LASIX) 20 MG tablet Take 20 mg by mouth 2 (two) times daily.   2  . gabapentin (NEURONTIN) 300 MG capsule Take 1 capsule (300 mg total) by mouth at bedtime. 30 capsule 3  . hydrALAZINE (APRESOLINE) 25 MG tablet Take 1 tablet (25 mg total) by mouth 3 (three) times daily. 270 tablet 3  . Multiple Vitamin (MULTIVITAMIN WITH MINERALS) TABS Take 1 tablet by mouth daily.    . pravastatin (PRAVACHOL) 20 MG tablet Take 20 mg by mouth every morning.    . prednisoLONE acetate (PRED FORTE) 1 % ophthalmic suspension Place 1 drop into the right eye 4 (four) times daily.     . valsartan (DIOVAN) 320 MG tablet TAKE 1 TABLET BY MOUTH EVERY DAY 30 tablet 11  . vitamin B-12 (CYANOCOBALAMIN) 100 MCG tablet Take 100 mcg by mouth daily.     No current facility-administered medications for this visit.    Allergies: Allergies  Allergen Reactions  . Lipitor [Atorvastatin] Swelling  . Simvastatin Other (See Comments)    Myalgias     . Ace Inhibitors Cough    Social History: The patient  reports that she quit smoking about 37 years ago. Her smoking use included Cigarettes. She has a 30 pack-year smoking history. She has never used smokeless tobacco. She reports that she does not drink alcohol or use illicit drugs.   Family History: The patient's family history includes Hypertension in her mother; Lung cancer in her father; Stroke in her mother.   Review  of Systems: Please see the history of present illness.   Otherwise, the review of systems is positive for none.   All other systems are reviewed and negative.   Physical Exam: VS:  BP 140/72 mmHg  Pulse 64  Ht 5\' 1"  (1.549 m)  Wt 199 lb (90.266 kg)  BMI 37.62 kg/m2 .  BMI Body mass index is 37.62 kg/(m^2).  Wt Readings from Last 3 Encounters:  03/21/15 199 lb (90.266 kg)  02/22/15 204 lb 1.9 oz (92.588 kg)  02/17/15 203 lb 12.8 oz (92.443 kg)    General: Pleasant.  Elderly black female who is alert and in no acute distress. Weight is down 5 pounds. She remains obese.  HEENT: Normal. Neck: Supple, no JVD, carotid bruits, or masses noted.  Cardiac: Regular rate and rhythm. ?S3. Heart tones distant. No edema.  Respiratory:  Lungs are clear to auscultation bilaterally with normal work of breathing.  GI: Soft and nontender.  MS: No deformity or atrophy. Gait and ROM intact. Skin: Warm and dry. Color is normal.  Neuro:  Strength and sensation are intact and no gross focal deficits noted. Minimial right sided weakness noted. Psych: Alert, appropriate and with normal affect.   LABORATORY DATA:  EKG:  EKG is not ordered today.  Lab Results  Component Value Date   WBC 4.3 01/30/2015   HGB 13.3 01/30/2015   HCT 39.8 01/30/2015   PLT 216 01/30/2015   GLUCOSE 102* 01/30/2015   CHOL 204* 01/25/2015   TRIG 156* 01/25/2015   HDL 51 01/25/2015   LDLCALC 122* 01/25/2015   ALT 18 01/30/2015   AST 21 01/30/2015   NA 141 01/30/2015   K 4.0 01/30/2015   CL 106 01/30/2015   CREATININE 1.09* 01/30/2015   BUN 17 01/30/2015   CO2 28 01/30/2015   INR 1.24 01/30/2015   HGBA1C 6.1* 01/25/2015    BNP (last 3 results) No results for input(s): BNP in the last 8760 hours.  ProBNP (last 3 results) No results for input(s): PROBNP in the last 8760 hours.   Other Studies Reviewed Today:  Mr Brain Wo Contrast 01/28/2015  IMPRESSION: Exam is motion degraded. Small acute nonhemorrhagic  infarct posterior limb left internal capsule. Mild to moderate chronic small vessel disease changes. Global atrophy. Ventricular prominence felt to be related to atrophy rather hydrocephalus. No intracranial mass lesion noted on this unenhanced exam. Major intracranial vascular structures are patent. Please see recent MR angiogram report. Ectatic right carotid terminus. 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 Exophthalmos. Electronically Signed By: Genia Del M.D. On: 01/28/2015 13:47   Carotid US 01/26/15 RICA 1-39%  Echo 01/25/15 Moderate LVH, EF 30-35%, diffuse HK, grade 1 diastolic dysfunction, MAC, severe LAE, PASP 38 mmHg  Myoview 4/15 No ischemia, EF 53%, normal study  Echo 4/15 EF 50-55%, mild MR, moderate LAE  ASSESSMENT:    1. 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 DCM (dilated cardiomyopathy) ([place])   3. History of CVA (cerebrovascular accident)   4. Paroxysmal atrial fibrillation ([facility])   5. Hypertensive heart disease with heart failure ([place])   6. OSA on CPAP     PLAN:    In order of problems listed above:  1. Chronic combined systolic and diastolic CHF - Volume appears stable. Continue current therapy for now. Needs echo after April 18. Needs to verify her dose of Coreg. Repeat BP by me is down to 136/85. I have left her on her current regimen.   2. Dilated cardiomyopathy - Likely related to uncontrolled hypertension. Plan will be to repeat her echocardiogram in approximately 3 months (after April 18). If her EF does not improve with good blood pressure control, consider proceeding with cardiac catheterization. Continue beta blocker, ARB.   3. History of CVA - Follow-up with neurology as planned. Explained to her that modification of her risk factors is the main therapy going forward. Continue Eliquis, statin.  4. PAF - She has a high risk of stroke. She is now on Eliquis. Currently maintaining  sinus rhythm. At next visit. No problems noted. Encouraged  her to not be too hasty to give up her walker. Safety needs to be primary goal.   5. Hypertensive heart disease - Hydralazine added at last visit. BP has improved.   6. OSA - Continue CPAP.        Current medicines are reviewed with the patient today.  The patient does not have concerns regarding medicines other than what has been noted above.  The following changes have been made:  See above.  Labs/ tests ordered today include:    Orders Placed This Encounter  Procedures  . ECHOCARDIOGRAM COMPLETE     Disposition:   FU with Dr. Radford Pax after echo.   Patient is agreeable to this plan and will call if any problems develop in the interim.   Signed: Burtis Junes, RN, ANP-C 03/21/2015 2:48 PM  [facility] [address] [place],   [address] Phone: [phone] Fax: [phone]

## 2015-03-27 DIAGNOSIS — F329 Major depressive disorder, single episode, unspecified: Secondary | ICD-10-CM | POA: Diagnosis not present

## 2015-03-27 DIAGNOSIS — I5042 Chronic combined systolic (congestive) and diastolic (congestive) heart failure: Secondary | ICD-10-CM | POA: Diagnosis not present

## 2015-03-27 DIAGNOSIS — I429 Cardiomyopathy, unspecified: Secondary | ICD-10-CM | POA: Diagnosis not present

## 2015-03-27 DIAGNOSIS — I69311 Memory deficit following cerebral infarction: Secondary | ICD-10-CM | POA: Diagnosis not present

## 2015-03-27 DIAGNOSIS — Z7901 Long term (current) use of anticoagulants: Secondary | ICD-10-CM | POA: Diagnosis not present

## 2015-03-27 DIAGNOSIS — I480 Paroxysmal atrial fibrillation: Secondary | ICD-10-CM | POA: Diagnosis not present

## 2015-03-27 DIAGNOSIS — G4733 Obstructive sleep apnea (adult) (pediatric): Secondary | ICD-10-CM | POA: Diagnosis not present

## 2015-03-27 DIAGNOSIS — M4800 Spinal stenosis, site unspecified: Secondary | ICD-10-CM | POA: Diagnosis not present

## 2015-03-27 DIAGNOSIS — I69322 Dysarthria following cerebral infarction: Secondary | ICD-10-CM | POA: Diagnosis not present

## 2015-03-27 DIAGNOSIS — I69351 Hemiplegia and hemiparesis following cerebral infarction affecting right dominant side: Secondary | ICD-10-CM | POA: Diagnosis not present

## 2015-03-27 DIAGNOSIS — E785 Hyperlipidemia, unspecified: Secondary | ICD-10-CM | POA: Diagnosis not present

## 2015-03-27 DIAGNOSIS — M1990 Unspecified osteoarthritis, unspecified site: Secondary | ICD-10-CM | POA: Diagnosis not present

## 2015-03-27 DIAGNOSIS — I110 Hypertensive heart disease with heart failure: Secondary | ICD-10-CM | POA: Diagnosis not present

## 2015-04-03 DIAGNOSIS — I69311 Memory deficit following cerebral infarction: Secondary | ICD-10-CM | POA: Diagnosis not present

## 2015-04-03 DIAGNOSIS — M1990 Unspecified osteoarthritis, unspecified site: Secondary | ICD-10-CM | POA: Diagnosis not present

## 2015-04-03 DIAGNOSIS — M4800 Spinal stenosis, site unspecified: Secondary | ICD-10-CM | POA: Diagnosis not present

## 2015-04-03 DIAGNOSIS — I69351 Hemiplegia and hemiparesis following cerebral infarction affecting right dominant side: Secondary | ICD-10-CM | POA: Diagnosis not present

## 2015-04-03 DIAGNOSIS — Z7901 Long term (current) use of anticoagulants: Secondary | ICD-10-CM | POA: Diagnosis not present

## 2015-04-03 DIAGNOSIS — G4733 Obstructive sleep apnea (adult) (pediatric): Secondary | ICD-10-CM | POA: Diagnosis not present

## 2015-04-03 DIAGNOSIS — I480 Paroxysmal atrial fibrillation: Secondary | ICD-10-CM | POA: Diagnosis not present

## 2015-04-03 DIAGNOSIS — I429 Cardiomyopathy, unspecified: Secondary | ICD-10-CM | POA: Diagnosis not present

## 2015-04-03 DIAGNOSIS — I110 Hypertensive heart disease with heart failure: Secondary | ICD-10-CM | POA: Diagnosis not present

## 2015-04-03 DIAGNOSIS — E785 Hyperlipidemia, unspecified: Secondary | ICD-10-CM | POA: Diagnosis not present

## 2015-04-03 DIAGNOSIS — I5042 Chronic combined systolic (congestive) and diastolic (congestive) heart failure: Secondary | ICD-10-CM | POA: Diagnosis not present

## 2015-04-03 DIAGNOSIS — F329 Major depressive disorder, single episode, unspecified: Secondary | ICD-10-CM | POA: Diagnosis not present

## 2015-04-03 DIAGNOSIS — I69322 Dysarthria following cerebral infarction: Secondary | ICD-10-CM | POA: Diagnosis not present

## 2015-04-04 ENCOUNTER — Telehealth: Payer: Self-pay | Admitting: Neurology

## 2015-04-04 NOTE — Telephone Encounter (Signed)
Patient is calling to say she will be here on 04/06/15.  Thanks!

## 2015-04-06 ENCOUNTER — Encounter: Payer: Self-pay | Admitting: Neurology

## 2015-04-06 ENCOUNTER — Ambulatory Visit (INDEPENDENT_AMBULATORY_CARE_PROVIDER_SITE_OTHER): Payer: PPO | Admitting: Neurology

## 2015-04-06 VITALS — BP 147/73 | HR 57 | Ht 61.0 in | Wt 202.4 lb

## 2015-04-06 DIAGNOSIS — E784 Other hyperlipidemia: Secondary | ICD-10-CM

## 2015-04-06 DIAGNOSIS — E7849 Other hyperlipidemia: Secondary | ICD-10-CM

## 2015-04-06 NOTE — Progress Notes (Signed)
Susan [name] [address] [place]. Susan [name] [address] [phone]       OFFICE FOLLOW-UP NOTE  Susan [name] Date of Birth:  06-Mar-1940 Medical Record Number:  [id number]   HPI: 7 lady seen today for first office follow-up visit following hospital admission for TIA and stroke in January 2017. She is accompanied by her granddaughter today. GERALYN MALTOS is an 75 y.o. female with a past medical history significant for HLD, HTN, chronic congestive heart failure, hypothyroidism, OSA, depression, anxiety, brought in by family due to increasing weakness and inability to walk. Of note, patient was released from [facility] on 01/26/15 with diagnosis of TIA (had dizziness and right sided weakness) and had comprehensive TIA work up that was significant only for TEE with EF 30-35%, severly dilated left atrium, diffuse hypokinesis, but no mural thrombus seen. She was discharged home on apixaban for atrial fibrillation history.. Patient lives by herself and her daughter is at the bedside stating that she talked to her mother on the phone last night and her speech was not normal " like having a thick tongue". Then, this morning she noted that the speech was worse, the right face was drooping, and she couldn't use the right arm properly. Similarly, patient was so weak this morning that couldn't participate on her home PT session. Denies HA, vertigo, double vision, difficulty swallowing, or vision impairment. Endorses feeling very fatigue since yesterday.MRI brain was personally reviewed and showed a small acute nonhemorrhagic infarct posterior limb left internal capsule. Date last known well: unclear Time last known well: unclear Tpa given: no, late presentation MRI scan of the brain on 01/28/15 showed small acute left posterior limb internal capsule infarct and mild to moderate changes of chronic small vessel disease. MRA of the brain showed a high-grade left M1 and M2 branch stenosis. 2-D echo  showed decreased ejection fraction of 3035% but no clot. Carotid ultrasound showed no significant extracranial stenosis. LDL cholesterol was elevated at 122. Hemoglobin A1c was 6.1. Patient was transferred to rehabilitation twice in place where she stood for 2 weeks and now she is currently at home. She can walk short distances indoors with a cane and uses a walker for long distances. She states she is careful she had no falls. She remains on eliquis which is tolerating well without bleeding or bruising. She states her blood pressure is quite good at home though today it is slightly elevated at 147/73. She has not had any follow-up lipid profile checked. She is tolerating Pravachol without any side effects ROS:   14 system review of systems is positive for leg swelling cough, incontinence, joint pain, memory loss, sleepiness, depression, anxiety, not enough sleep, decreased energy and disinterest in activities and all other systems negative PMH:  Past Medical History  Diagnosis Date  . Hyperlipidemia   . Pedal edema   . Spinal stenosis   . Varicose veins     "BLE" (07/29/2012)  . Primary hyperparathyroidism ([place])   . Thyroid disease   . Incontinent of urine     wears pads  . Family history of anesthesia complication     "some wake up during OR; some are hard to wake up; some both" (07/29/2012)  . OSA on CPAP   . Arthritis     "legs, back" (07/29/2012)  . Osteoarthritis of right knee   . Umbilical hernia     "unrepaired" (07/29/2012)  . Chronic lower back pain   . History of stomach ulcers 1980's  .  Anxiety   . Depression   . GERD (gastroesophageal reflux disease)   . Hypertension   . Chronic diastolic CHF (congestive heart failure) ([place])   . Complication of anesthesia     "I have apnea" (07/29/2012)  . Dementia     per family  . Stroke [facility])     Social History:  Social History   Social History  . Marital Status: Widowed    Spouse Name: N/A  . Number of Children: N/A  . Years of  Education: N/A   Occupational History  . retired    Social History Main Topics  . Smoking status: Former Smoker -- 1.00 packs/day for 30 years    Types: Cigarettes    Quit date: 01/07/1978  . Smokeless tobacco: Never Used  . Alcohol Use: No  . Drug Use: No  . Sexual Activity: No   Other Topics Concern  . Not on file   Social History Narrative    Medications:   Current Outpatient Prescriptions on File Prior to Visit  Medication Sig Dispense Refill  . apixaban (ELIQUIS) 5 MG TABS tablet Take 1 tablet (5 mg total) by mouth every 12 (twelve) hours. 60 tablet 4  . buPROPion (WELLBUTRIN XL) 300 MG 24 hr tablet Take 300 mg by mouth daily.  12  . carvedilol (COREG) 6.25 MG tablet Take 6.25 mg by mouth 2 (two) times daily with a meal.     . cholecalciferol (VITAMIN D) 1000 units tablet Take 1,000 Units by mouth daily.    Marland Kitchen ezetimibe (ZETIA) 10 MG tablet Take 1 tablet (10 mg total) by mouth daily. 30 tablet 3  . furosemide (LASIX) 20 MG tablet Take 20 mg by mouth 2 (two) times daily.   2  . gabapentin (NEURONTIN) 300 MG capsule Take 1 capsule (300 mg total) by mouth at bedtime. 30 capsule 3  . hydrALAZINE (APRESOLINE) 25 MG tablet Take 1 tablet (25 mg total) by mouth 3 (three) times daily. 270 tablet 3  . Multiple Vitamin (MULTIVITAMIN WITH MINERALS) TABS Take 1 tablet by mouth daily.    . pravastatin (PRAVACHOL) 20 MG tablet Take 20 mg by mouth every morning.    . prednisoLONE acetate (PRED FORTE) 1 % ophthalmic suspension Place 1 drop into the right eye 4 (four) times daily.     . valsartan (DIOVAN) 320 MG tablet TAKE 1 TABLET BY MOUTH EVERY DAY 30 tablet 11  . vitamin B-12 (CYANOCOBALAMIN) 100 MCG tablet Take 100 mcg by mouth daily.     No current facility-administered medications on file prior to visit.    Allergies:   Allergies  Allergen Reactions  . Lipitor [Atorvastatin] Swelling  . Simvastatin Other (See Comments)    Myalgias     . Ace Inhibitors Cough    Physical  Exam General: well developed, well nourished elderly lady , seated, in no evident distress Head: head normocephalic and atraumatic.  Neck: supple with no carotid or supraclavicular bruits Cardiovascular: regular rate and rhythm, no murmurs Musculoskeletal: no deformity Skin:  no rash/petichiae Vascular:  Normal pulses all extremities Filed Vitals:   04/06/15 1047  BP: 147/73  Pulse: 57   Neurologic Exam Mental Status: Awake and fully alert. Oriented to place and time. Recent and remote memory intact. Attention span, concentration and fund of knowledge appropriate. Mood and affect appropriate.  Cranial Nerves: Fundoscopic exam reveals sharp disc margins. Pupils equal, briskly reactive to light. Extraocular movements full without nystagmus. Visual fields full to confrontation. Hearing intact. Facial sensation  intact. Face, tongue, palate moves normally and symmetrically.  Motor: Normal bulk and tone. Normal strength in all tested extremity muscles. Diminished fine finger movements on the right. Orbits left over right upper extremity. Minimum weakness of right grip. Sensory.: intact to touch ,pinprick .position and vibratory sensation.  Coordination: Rapid alternating movements normal in all extremities. Finger-to-nose and heel-to-shin performed accurately bilaterally. Gait and Station: Arises from chair without difficulty. Stance is normal. Gait demonstrates normal stride length and balance . Able to heel, toe and tandem walk with mild difficulty.  Reflexes: 1+ and symmetric. Toes downgoing.   NIHSS  0 Modified Rankin  1  ASSESSMENT: 41 year lady with left brain subcortical infarct in January 2017 secondary to small vessel disease. Vascular risk factors of hypertension hyperlipidemia and age    PLAN: I had a long d/w patient and garnd daughter about her recent stroke,atrial fibrillation risk for recurrent stroke/TIAs, personally independently reviewed imaging studies and stroke  evaluation results and answered questions.Continue Eliquis for secondary stroke prevention and maintain strict control of hypertension with blood pressure goal below 130/90, diabetes with hemoglobin A1c goal below 6.5% and lipids with LDL cholesterol goal below 70 mg/dL. I also advised the patient to eat a healthy diet with plenty of whole grains, cereals, fruits and vegetables, exercise regularly and maintain ideal body weight .We also talked about fall risk prevention precautions she was advised to use her cane and walker at all times Greater than 50% of time during this 25 minute visit was spent on counseling,explanation of diagnosis, planning of further management, discussion with patient and family and coordination of care .Followup in the future with stroke nurse practitioner in 6 months or call earlier if necessary Antony Contras, MD  Note: This document was prepared with digital dictation and possible smart phrase technology. Any transcriptional errors that result from this process are unintentional

## 2015-04-06 NOTE — Patient Instructions (Signed)
I had a long d/w patient and garnd daughter about her recent stroke,atrial fibrillation risk for recurrent stroke/TIAs, personally independently reviewed imaging studies and stroke evaluation results and answered questions.Continue Eliquis for secondary stroke prevention and maintain strict control of hypertension with blood pressure goal below 130/90, diabetes with hemoglobin A1c goal below 6.5% and lipids with LDL cholesterol goal below 70 mg/dL. I also advised the patient to eat a healthy diet with plenty of whole grains, cereals, fruits and vegetables, exercise regularly and maintain ideal body weight .We also talked about fall risk prevention precautions she was advised to use her cane and walker at all times Followup in the future with stroke nurse practitioner in 6 months or call earlier if necessary Stroke Prevention Some medical conditions and behaviors are associated with an increased chance of having a stroke. You may prevent a stroke by making healthy choices and managing medical conditions. HOW CAN I REDUCE MY RISK OF HAVING A STROKE?   Stay physically active. Get at least 30 minutes of activity on most or all days.  Do not smoke. It may also be helpful to avoid exposure to secondhand smoke.  Limit alcohol use. Moderate alcohol use is considered to be:  No more than 2 drinks per day for men.  No more than 1 drink per day for nonpregnant women.  Eat healthy foods. This involves:  Eating 5 or more servings of fruits and vegetables a day.  Making dietary changes that address high blood pressure (hypertension), high cholesterol, diabetes, or obesity.  Manage your cholesterol levels.  Making food choices that are high in fiber and low in saturated fat, trans fat, and cholesterol may control cholesterol levels.  Take any prescribed medicines to control cholesterol as directed by your health care provider.  Manage your diabetes.  Controlling your carbohydrate and sugar intake is  recommended to manage diabetes.  Take any prescribed medicines to control diabetes as directed by your health care provider.  Control your hypertension.  Making food choices that are low in salt (sodium), saturated fat, trans fat, and cholesterol is recommended to manage hypertension.  Ask your health care provider if you need treatment to lower your blood pressure. Take any prescribed medicines to control hypertension as directed by your health care provider.  If you are 73-2 years of age, have your blood pressure checked every 3-5 years. If you are 71 years of age or older, have your blood pressure checked every year.  Maintain a healthy weight.  Reducing calorie intake and making food choices that are low in sodium, saturated fat, trans fat, and cholesterol are recommended to manage weight.  Stop drug abuse.  Avoid taking birth control pills.  Talk to your health care provider about the risks of taking birth control pills if you are over 73 years old, smoke, get migraines, or have ever had a blood clot.  Get evaluated for sleep disorders (sleep apnea).  Talk to your health care provider about getting a sleep evaluation if you snore a lot or have excessive sleepiness.  Take medicines only as directed by your health care provider.  For some people, aspirin or blood thinners (anticoagulants) are helpful in reducing the risk of forming abnormal blood clots that can lead to stroke. If you have the irregular heart rhythm of atrial fibrillation, you should be on a blood thinner unless there is a good reason you cannot take them.  Understand all your medicine instructions.  Make sure that other conditions (such as anemia  or atherosclerosis) are addressed. SEEK IMMEDIATE MEDICAL CARE IF:   You have sudden weakness or numbness of the face, arm, or leg, especially on one side of the body.  Your face or eyelid droops to one side.  You have sudden confusion.  You have trouble speaking  (aphasia) or understanding.  You have sudden trouble seeing in one or both eyes.  You have sudden trouble walking.  You have dizziness.  You have a loss of balance or coordination.  You have a sudden, severe headache with no known cause.  You have new chest pain or an irregular heartbeat. Any of these symptoms may represent a serious problem that is an emergency. Do not wait to see if the symptoms will go away. Get medical help at once. Call your local emergency services (911 in [place] Do not drive yourself to the hospital.   This information is not intended to replace advice given to you by your health care provider. Make sure you discuss any questions you have with your health care provider.   Document Released: 02/01/2004 Document Revised: 01/14/2014 Document Reviewed: 06/26/2012 Elsevier Interactive Patient Education 2016 [place] in the Home  Falls can cause injuries and can affect people from all age groups. There are many simple things that you can do to make your home safe and to help prevent falls. WHAT CAN I DO ON THE OUTSIDE OF MY HOME?  Regularly repair the edges of walkways and driveways and fix any cracks.  Remove high doorway thresholds.  Trim any shrubbery on the main path into your home.  Use bright outdoor lighting.  Clear walkways of debris and clutter, including tools and rocks.  Regularly check that handrails are securely fastened and in good repair. Both sides of any steps should have handrails.  Install guardrails along the edges of any raised decks or porches.  Have leaves, snow, and ice cleared regularly.  Use sand or salt on walkways during winter months.  In the garage, clean up any spills right away, including grease or oil spills. WHAT CAN I DO IN THE BATHROOM?  Use night lights.  Install grab bars by the toilet and in the tub and shower. Do not use towel bars as grab bars.  Use non-skid mats or decals on the floor of  the tub or shower.  If you need to sit down while you are in the shower, use a plastic, non-slip stool.Marland Kitchen  Keep the floor dry. Immediately clean up any water that spills on the floor.  Remove soap buildup in the tub or shower on a regular basis.  Attach bath mats securely with double-sided non-slip rug tape.  Remove throw rugs and other tripping hazards from the floor. WHAT CAN I DO IN THE BEDROOM?  Use night lights.  Make sure that a bedside light is easy to reach.  Do not use oversized bedding that drapes onto the floor.  Have a firm chair that has side arms to use for getting dressed.  Remove throw rugs and other tripping hazards from the floor. WHAT CAN I DO IN THE KITCHEN?   Clean up any spills right away.  Avoid walking on wet floors.  Place frequently used items in easy-to-reach places.  If you need to reach for something above you, use a sturdy step stool that has a grab bar.  Keep electrical cables out of the way.  Do not use floor polish or wax that makes floors slippery. If you have to use  wax, make sure that it is non-skid floor wax.  Remove throw rugs and other tripping hazards from the floor. WHAT CAN I DO IN THE STAIRWAYS?  Do not leave any items on the stairs.  Make sure that there are handrails on both sides of the stairs. Fix handrails that are broken or loose. Make sure that handrails are as long as the stairways.  Check any carpeting to make sure that it is firmly attached to the stairs. Fix any carpet that is loose or worn.  Avoid having throw rugs at the top or bottom of stairways, or secure the rugs with carpet tape to prevent them from moving.  Make sure that you have a light switch at the top of the stairs and the bottom of the stairs. If you do not have them, have them installed. WHAT ARE SOME OTHER FALL PREVENTION TIPS?  Wear closed-toe shoes that fit well and support your feet. Wear shoes that have rubber soles or low heels.  When you use  a stepladder, make sure that it is completely opened and that the sides are firmly locked. Have someone hold the ladder while you are using it. Do not climb a closed stepladder.  Add color or contrast paint or tape to grab bars and handrails in your home. Place contrasting color strips on the first and last steps.  Use mobility aids as needed, such as canes, walkers, scooters, and crutches.  Turn on lights if it is dark. Replace any light bulbs that burn out.  Set up furniture so that there are clear paths. Keep the furniture in the same spot.  Fix any uneven floor surfaces.  Choose a carpet design that does not hide the edge of steps of a stairway.  Be aware of any and all pets.  Review your medicines with your healthcare provider. Some medicines can cause dizziness or changes in blood pressure, which increase your risk of falling. Talk with your health care provider about other ways that you can decrease your risk of falls. This may include working with a physical therapist or trainer to improve your strength, balance, and endurance.   This information is not intended to replace advice given to you by your health care provider. Make sure you discuss any questions you have with your health care provider.   Document Released: 12/14/2001 Document Revised: 05/10/2014 Document Reviewed: 01/28/2014 Elsevier Interactive Patient Education [employer].

## 2015-04-07 ENCOUNTER — Other Ambulatory Visit: Payer: Self-pay | Admitting: Neurology

## 2015-04-07 DIAGNOSIS — I6930 Unspecified sequelae of cerebral infarction: Secondary | ICD-10-CM | POA: Diagnosis not present

## 2015-04-07 DIAGNOSIS — I639 Cerebral infarction, unspecified: Secondary | ICD-10-CM | POA: Diagnosis not present

## 2015-04-07 DIAGNOSIS — I480 Paroxysmal atrial fibrillation: Secondary | ICD-10-CM | POA: Diagnosis not present

## 2015-04-07 DIAGNOSIS — I10 Essential (primary) hypertension: Secondary | ICD-10-CM | POA: Diagnosis not present

## 2015-04-07 DIAGNOSIS — E7800 Pure hypercholesterolemia, unspecified: Secondary | ICD-10-CM | POA: Diagnosis not present

## 2015-04-07 LAB — LIPID PANEL
CHOLESTEROL TOTAL: 215 mg/dL — AB (ref 100–199)
Chol/HDL Ratio: 3.4 ratio units (ref 0.0–4.4)
HDL: 63 mg/dL (ref >39)
LDL CALC: 128 mg/dL — AB (ref 0–99)
Triglycerides: 120 mg/dL (ref 0–149)
VLDL CHOLESTEROL CAL: 24 mg/dL (ref 5–40)

## 2015-04-07 LAB — HEPATIC FUNCTION PANEL
ALBUMIN: 4.2 g/dL (ref 3.5–4.8)
ALT: 23 IU/L (ref 0–32)
AST: 25 IU/L (ref 0–40)
Alkaline Phosphatase: 64 IU/L (ref 39–117)
Bilirubin Total: 0.6 mg/dL (ref 0.0–1.2)
Bilirubin, Direct: 0.14 mg/dL (ref 0.00–0.40)
Total Protein: 6.6 g/dL (ref 6.0–8.5)

## 2015-04-07 MED ORDER — PRAVASTATIN SODIUM 20 MG PO TABS: 40.0000 mg | ORAL_TABLET | Freq: Every morning | ORAL | Status: DC

## 2015-04-27 ENCOUNTER — Other Ambulatory Visit (HOSPITAL_COMMUNITY): Payer: Self-pay

## 2015-04-28 ENCOUNTER — Other Ambulatory Visit: Payer: Self-pay

## 2015-04-28 ENCOUNTER — Ambulatory Visit (HOSPITAL_COMMUNITY): Payer: PPO | Attending: Cardiology

## 2015-04-28 DIAGNOSIS — Z8673 Personal history of transient ischemic attack (TIA), and cerebral infarction without residual deficits: Secondary | ICD-10-CM | POA: Diagnosis not present

## 2015-04-28 DIAGNOSIS — I480 Paroxysmal atrial fibrillation: Secondary | ICD-10-CM | POA: Diagnosis not present

## 2015-04-28 DIAGNOSIS — I5042 Chronic combined systolic (congestive) and diastolic (congestive) heart failure: Secondary | ICD-10-CM | POA: Diagnosis not present

## 2015-04-28 DIAGNOSIS — I110 Hypertensive heart disease with heart failure: Secondary | ICD-10-CM | POA: Diagnosis not present

## 2015-04-28 DIAGNOSIS — I340 Nonrheumatic mitral (valve) insufficiency: Secondary | ICD-10-CM | POA: Insufficient documentation

## 2015-04-28 DIAGNOSIS — I429 Cardiomyopathy, unspecified: Secondary | ICD-10-CM | POA: Diagnosis not present

## 2015-04-28 DIAGNOSIS — I10 Essential (primary) hypertension: Secondary | ICD-10-CM | POA: Diagnosis not present

## 2015-05-10 ENCOUNTER — Telehealth: Payer: Self-pay | Admitting: Physician Assistant

## 2015-05-10 MED ORDER — CARVEDILOL 6.25 MG PO TABS: 6.2500 mg | ORAL_TABLET | Freq: Two times a day (BID) | ORAL | Status: DC

## 2015-05-10 NOTE — Telephone Encounter (Signed)
Carvedilol 6.25 mg twice daily refilled.   Tarri Fuller PAC

## 2015-05-15 ENCOUNTER — Telehealth: Payer: Self-pay | Admitting: Neurology

## 2015-05-15 DIAGNOSIS — R609 Edema, unspecified: Secondary | ICD-10-CM | POA: Diagnosis not present

## 2015-05-15 DIAGNOSIS — F322 Major depressive disorder, single episode, severe without psychotic features: Secondary | ICD-10-CM | POA: Diagnosis not present

## 2015-05-15 DIAGNOSIS — I10 Essential (primary) hypertension: Secondary | ICD-10-CM | POA: Diagnosis not present

## 2015-05-15 DIAGNOSIS — I639 Cerebral infarction, unspecified: Secondary | ICD-10-CM | POA: Diagnosis not present

## 2015-05-15 DIAGNOSIS — I878 Other specified disorders of veins: Secondary | ICD-10-CM | POA: Diagnosis not present

## 2015-05-15 DIAGNOSIS — I6930 Unspecified sequelae of cerebral infarction: Secondary | ICD-10-CM | POA: Diagnosis not present

## 2015-05-15 NOTE — Telephone Encounter (Signed)
I spoke to the patient's granddaughter was concerned about her driving skills due to her mild dementia. I explained to her that the patient seems to have physically recovered well from a stroke and during my last office visit I did not evaluate her specifically for dementia. I am not in a position to advised not to drive without doing a more detailed cognitive assessment. She understood and patient will make an appointment to see me specifically to evaluate her dementia and driving ability

## 2015-05-15 NOTE — Telephone Encounter (Signed)
Granddaughter Kirti Moynahan called, is concerned about patient's driving, states PCP advise patient should not be driving at all, states patient has h/o stroke and dementia and balance off since stroke. States Dr. Leonie Man told patient that she could drive within 5 minutes of house. Please call [phone].

## 2015-05-22 ENCOUNTER — Encounter: Payer: Self-pay | Admitting: Cardiology

## 2015-05-22 ENCOUNTER — Ambulatory Visit (INDEPENDENT_AMBULATORY_CARE_PROVIDER_SITE_OTHER): Payer: PPO | Admitting: Cardiology

## 2015-05-22 VITALS — BP 162/80 | HR 59 | Ht 61.0 in | Wt 202.0 lb

## 2015-05-22 DIAGNOSIS — I119 Hypertensive heart disease without heart failure: Secondary | ICD-10-CM | POA: Diagnosis not present

## 2015-05-22 DIAGNOSIS — I5032 Chronic diastolic (congestive) heart failure: Secondary | ICD-10-CM | POA: Diagnosis not present

## 2015-05-22 DIAGNOSIS — G4733 Obstructive sleep apnea (adult) (pediatric): Secondary | ICD-10-CM

## 2015-05-22 DIAGNOSIS — I420 Dilated cardiomyopathy: Secondary | ICD-10-CM

## 2015-05-22 DIAGNOSIS — I480 Paroxysmal atrial fibrillation: Secondary | ICD-10-CM | POA: Diagnosis not present

## 2015-05-22 MED ORDER — FUROSEMIDE 40 MG PO TABS: 40.0000 mg | ORAL_TABLET | Freq: Every day | ORAL | Status: DC

## 2015-05-22 NOTE — Progress Notes (Signed)
Cardiology Office Note    Date:  05/22/2015   ID:  Susan [name], DOB Jan 14, 1940, MRN [id number]  PCP:  [name], MD  Cardiologist:  Fransico Him, MD   No chief complaint on file.   History of Present Illness:  Susan [name] is a 75 y.o. female with a history of OSA, HTN, obesity and chronic diastolic CHF. She denies chest pain or pressure and denies any SOB. She denies any palpitations, dizziness but does have chronic LE edema which is stable.She tolerates her CPAP well. She tolerates the full face mask but only uses it every other night. She feels rested in the am but does get daytime sleepiness in the afternoon if she uses it the night before.     Past Medical History  Diagnosis Date  . Hyperlipidemia   . Pedal edema   . Spinal stenosis   . Varicose veins     "BLE" (07/29/2012)  . Primary hyperparathyroidism ([place])   . Thyroid disease   . Incontinent of urine     wears pads  . Family history of anesthesia complication     "some wake up during OR; some are hard to wake up; some both" (07/29/2012)  . OSA on CPAP   . Arthritis     "legs, back" (07/29/2012)  . Osteoarthritis of right knee   . Umbilical hernia     "unrepaired" (07/29/2012)  . Chronic lower back pain   . History of stomach ulcers 1980's  . Anxiety   . Depression   . GERD (gastroesophageal reflux disease)   . Hypertension   . Chronic diastolic (congestive) heart failure ([place])   . Complication of anesthesia     "I have apnea" (07/29/2012)  . Dementia     per family  . Stroke ([place])   . PAF (paroxysmal atrial fibrillation) (H[facility])     CHADS2VASC score is 7 - on chronic anticoagulation with Apixaban  . Chronic diastolic (congestive) heart failure [facility])     Past Surgical History  Procedure Laterality Date  . Iud removal  1980's  . Parathyroidectomy N/A 06/16/2012    Procedure: NECK EXPLORATION AND LEFT SUPERIOR PARATHYROIDECTOMY;  Surgeon: Earnstine Regal, MD;  Location: [facility];  Service:  General;  Laterality: N/A;  . Colonoscopy    . 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 Cholecystectomy  ~ 2010  . Total knee arthroplasty Right 07/29/2012    Procedure: TOTAL KNEE ARTHROPLASTY;  Surgeon: Ninetta Lights, MD;  Location: [facility];  Service: Orthopedics;  Laterality: Right;    Current Medications: Outpatient Prescriptions Prior to Visit  Medication Sig Dispense Refill  . apixaban (ELIQUIS) 5 MG TABS tablet Take 1 tablet (5 mg total) by mouth every 12 (twelve) hours. 60 tablet 4  . carvedilol (COREG) 6.25 MG tablet Take 1 tablet (6.25 mg total) by mouth 2 (two) times daily with a meal. 60 tablet 11  . cholecalciferol (VITAMIN D) 1000 units tablet Take 1,000 Units by mouth daily.    Marland Kitchen ezetimibe (ZETIA) 10 MG tablet Take 1 tablet (10 mg total) by mouth daily. 30 tablet 3  . furosemide (LASIX) 20 MG tablet Take 20 mg by mouth 2 (two) times daily.   2  . hydrALAZINE (APRESOLINE) 25 MG tablet Take 1 tablet (25 mg total) by mouth 3 (three) times daily. 270 tablet 3  . Multiple Vitamin (MULTIVITAMIN WITH MINERALS) TABS Take 1 tablet by mouth daily.    . pravastatin (PRAVACHOL) 20 MG tablet Take  2 tablets (40 mg total) by mouth every morning. 60 tablet 3  . valsartan (DIOVAN) 320 MG tablet TAKE 1 TABLET BY MOUTH EVERY DAY 30 tablet 11  . vitamin B-12 (CYANOCOBALAMIN) 100 MCG tablet Take 100 mcg by mouth daily.    Marland Kitchen buPROPion (WELLBUTRIN XL) 300 MG 24 hr tablet Take 300 mg by mouth daily. Reported on 05/22/2015  12  . gabapentin (NEURONTIN) 300 MG capsule Take 1 capsule (300 mg total) by mouth at bedtime. (Patient not taking: Reported on 05/22/2015) 30 capsule 3  . prednisoLONE acetate (PRED FORTE) 1 % ophthalmic suspension Place 1 drop into the right eye 4 (four) times daily. Reported on 05/22/2015     No facility-administered medications prior to visit.     Allergies:   Lipitor; Simvastatin; and Ace inhibitors   Social History   Social History  . Marital Status: Widowed    Spouse  Name: N/A  . Number of Children: N/A  . Years of Education: N/A   Occupational History  . retired    Social History Main Topics  . Smoking status: Former Smoker -- 1.00 packs/day for 30 years    Types: Cigarettes    Quit date: 01/07/1978  . Smokeless tobacco: Never Used  . Alcohol Use: No  . Drug Use: No  . Sexual Activity: No   Other Topics Concern  . Not on file   Social History Narrative     Family History:  The patient's family history includes Hypertension in her mother; Lung cancer in her father; Stroke in her mother.   ROS:   Please see the history of present illness.    ROS All other systems reviewed and are negative.   PHYSICAL EXAM:   VS:  BP 162/80 mmHg  Pulse 59  Ht 5\' 1"  (1.549 m)  Wt 202 lb (91.627 kg)  BMI 38.19 kg/m2   GEN: Well nourished, well developed, in no acute distress HEENT: normal Neck: no JVD, carotid bruits, or masses Cardiac: RRR; no murmurs, rubs, or gallopps.  Trace edema.  Intact distal pulses bilaterally.  Respiratory:  clear to auscultation bilaterally, normal work of breathing GI: soft, nontender, nondistended, + BS MS: no deformity or atrophy Skin: warm and dry, no rash Neuro:  Alert and Oriented x 3, Strength and sensation are intact Psych: euthymic mood, full affect  Wt Readings from Last 3 Encounters:  05/22/15 202 lb (91.627 kg)  04/06/15 202 lb 6.4 oz (91.808 kg)  03/21/15 199 lb (90.266 kg)      Studies/Labs Reviewed:   EKG:  EKG is not ordered today.    Recent Labs: 01/30/2015: BUN 17; Creatinine, Ser 1.09*; Hemoglobin 13.3; Platelets 216; Potassium 4.0; Sodium 141 04/06/2015: ALT 23   Lipid Panel    Component Value Date/Time   CHOL 215* 04/06/2015 1136   CHOL 204* 01/25/2015 0408   TRIG 120 04/06/2015 1136   HDL 63 04/06/2015 1136   HDL 51 01/25/2015 0408   CHOLHDL 3.4 04/06/2015 1136   CHOLHDL 4.0 01/25/2015 0408   VLDL 31 01/25/2015 0408   LDLCALC 128* 04/06/2015 1136   LDLCALC 122* 01/25/2015 0408     Additional studies/ records that were reviewed today include: CPAP D/L    ASSESSMENT:    1. Chronic diastolic (congestive) heart failure ([place])   2. Hypertensive heart disease without heart failure   3. Paroxysmal atrial fibrillation (HCC)   4. DCM (dilated cardiomyopathy) ([place])   5. OSA (obstructive sleep apnea)      PLAN:  In order of problems listed above:  1. Chronic diastolic CHF - appears euvolemic on exam but dose have some intermittent LE edema.  Continue BB/hydralazine/ARB.  Increase Lasix to 40mg  daily.   2. HTN - BP borderline controlled on current medical regimen.  At home it runs around 140/97mmHg. Continue BB/Hydralazine/Diovan 3. PAF - maintaining NSR.  CHADS2VASC score is 7.  Continue Apixaban and Coreg.  4. DCM - DF now 55-60%.   5.   OSA - the patient is tolerating PAP therapy well without any problems. I will get a d/l from the DME>.     Medication Adjustments/Labs and Tests Ordered: Current medicines are reviewed at length with the patient today.  Concerns regarding medicines are outlined above.  Medication changes, Labs and Tests ordered today are listed in the Patient Instructions below.  There are no Patient Instructions on file for this visit.   Signed, Fransico Him, MD  05/22/2015 3:21 PM    [facility] [address], [place], [place]  [address] Phone: [phone]; Fax: [phone]

## 2015-05-22 NOTE — Patient Instructions (Signed)
Medication Instructions:  1.  INCREASE the Lasix to 40 mg taking 1 tablet daily  Labwork: 1 WEEK: 05/29/15:  BMET  Testing/Procedures: None ordered  Follow-Up: Your physician wants you to follow-up in: [place] DR. Mallie Snooks will receive a reminder letter in the mail two months in advance. If you don't receive a letter, please call our office to schedule the follow-up appointment.   Any Other Special Instructions Will Be Listed Below (If Applicable).     If you need a refill on your cardiac medications before your next appointment, please call your pharmacy.

## 2015-05-29 ENCOUNTER — Other Ambulatory Visit (INDEPENDENT_AMBULATORY_CARE_PROVIDER_SITE_OTHER): Payer: PPO

## 2015-05-29 DIAGNOSIS — I5032 Chronic diastolic (congestive) heart failure: Secondary | ICD-10-CM | POA: Diagnosis not present

## 2015-05-29 DIAGNOSIS — I119 Hypertensive heart disease without heart failure: Secondary | ICD-10-CM

## 2015-05-30 LAB — BASIC METABOLIC PANEL
BUN: 17 mg/dL (ref 7–25)
CALCIUM: 10.2 mg/dL (ref 8.6–10.4)
CO2: 31 mmol/L (ref 20–31)
CREATININE: 1.03 mg/dL — AB (ref 0.60–0.93)
Chloride: 104 mmol/L (ref 98–110)
GLUCOSE: 78 mg/dL (ref 65–99)
Potassium: 4 mmol/L (ref 3.5–5.3)
Sodium: 143 mmol/L (ref 135–146)

## 2015-05-31 ENCOUNTER — Telehealth: Payer: Self-pay | Admitting: Cardiology

## 2015-05-31 NOTE — Telephone Encounter (Signed)
Informed patient that her stable results were released to MyChart yesterday. Reviewed results with patient and she has no further questions.  Patient was grateful for call.

## 2015-05-31 NOTE — Telephone Encounter (Signed)
Mrs.Hutto is callign about her lab results , Please call   Thanks

## 2015-07-05 ENCOUNTER — Encounter: Payer: Self-pay | Admitting: Cardiology

## 2015-07-12 ENCOUNTER — Telehealth: Payer: Self-pay | Admitting: *Deleted

## 2015-07-12 DIAGNOSIS — G4733 Obstructive sleep apnea (adult) (pediatric): Secondary | ICD-10-CM

## 2015-07-12 NOTE — Telephone Encounter (Signed)
Orders placed for 2 week auto titration and  Mask check

## 2015-07-24 ENCOUNTER — Telehealth: Payer: Self-pay | Admitting: Cardiology

## 2015-07-24 ENCOUNTER — Other Ambulatory Visit: Payer: Self-pay

## 2015-07-24 NOTE — Telephone Encounter (Signed)
New message     The pt is incontinent and needs a prescription for briefs or does the pt need to go to her pharmacy. The pt does not know what to do about getting them, no other information given

## 2015-07-24 NOTE — Patient Outreach (Signed)
[facility]) Care Management  07/24/2015  Susan [name] October 29, 1940 [id number]     Telephone Screen  Referral Date: 07/18/15 Referral Source: [employer] Referral Reason: "patient agreeable to speak with [facility] nurse"    Outreach attempt #1 to patient. No answer at present. RN CM left HIPAA compliant voicemail message along with contact info.    Plan: RN CM will make outreach attempt to patient within a week.   Enzo Montgomery, RN,BSN,CCM [place] [facility] Telephonic Care Management Coordinator Direct Phone: [phone] Toll Free: [phone] Fax: [phone]

## 2015-07-24 NOTE — Telephone Encounter (Signed)
Informed patient that depending on her insurance, she may be able to get a prescription for briefs and have them paid for, and if not, she will have to get them at a drug store. Instructed her to call her PCP for prescription to try.  She was grateful for call.

## 2015-07-24 NOTE — Patient Outreach (Signed)
[facility]) Care Management  07/24/2015  Susan [name] July 20, 1940 [id number]   Telephone Screen  Referral Date: 07/18/15 Referral Source: [employer] Plan Referral Reason: "patient agreeable to speak with [facility] nurse"   Voicemail message received from patient. Return call placed to patient. Patient states she could only talk briefly as she was headed out the door. Explained and discussed[facility] services. Patient states she doesn't feel like she needs that at this time. She voiced that her main concern is the need for a wheelchair before the end of the day as she is headed out of town tomorrow. She states she spoke with her someone at "[employer]" and was told the process of how to obtain one. She has contacted MD office today and left message for nurse. Patient states that office staff did inform her that MD was out of office today and they would have to check to see if covering MD will write script. Patient voices that she is headed to MD office now to get matter resolved. She states she does not know someone who has wheelchair that she can borrow or if location where she is going has rental chair. Patient was agreeable to receiving [facility] info for future review and reference.   Plan: RN CM will send [facility] brochure and magnet to patient. RN CM will notify [facility] of case closure status.  Enzo Montgomery, RN,BSN,CCM [place] [facility] Telephonic Care Management Coordinator Direct Phone: [phone] Toll Free: [phone] Fax: [phone]

## 2015-07-27 ENCOUNTER — Ambulatory Visit: Payer: Self-pay

## 2015-08-03 DIAGNOSIS — Z9181 History of falling: Secondary | ICD-10-CM | POA: Diagnosis not present

## 2015-08-03 DIAGNOSIS — I110 Hypertensive heart disease with heart failure: Secondary | ICD-10-CM | POA: Diagnosis not present

## 2015-08-03 DIAGNOSIS — Z7901 Long term (current) use of anticoagulants: Secondary | ICD-10-CM | POA: Diagnosis not present

## 2015-08-03 DIAGNOSIS — M6281 Muscle weakness (generalized): Secondary | ICD-10-CM | POA: Diagnosis not present

## 2015-08-03 DIAGNOSIS — F322 Major depressive disorder, single episode, severe without psychotic features: Secondary | ICD-10-CM | POA: Diagnosis not present

## 2015-08-03 DIAGNOSIS — I872 Venous insufficiency (chronic) (peripheral): Secondary | ICD-10-CM | POA: Diagnosis not present

## 2015-08-03 DIAGNOSIS — R2689 Other abnormalities of gait and mobility: Secondary | ICD-10-CM | POA: Diagnosis not present

## 2015-08-03 DIAGNOSIS — I480 Paroxysmal atrial fibrillation: Secondary | ICD-10-CM | POA: Diagnosis not present

## 2015-08-03 DIAGNOSIS — Z87891 Personal history of nicotine dependence: Secondary | ICD-10-CM | POA: Diagnosis not present

## 2015-08-03 DIAGNOSIS — I5032 Chronic diastolic (congestive) heart failure: Secondary | ICD-10-CM | POA: Diagnosis not present

## 2015-08-03 DIAGNOSIS — I69398 Other sequelae of cerebral infarction: Secondary | ICD-10-CM | POA: Diagnosis not present

## 2015-08-09 DIAGNOSIS — I480 Paroxysmal atrial fibrillation: Secondary | ICD-10-CM | POA: Diagnosis not present

## 2015-08-09 DIAGNOSIS — I5032 Chronic diastolic (congestive) heart failure: Secondary | ICD-10-CM | POA: Diagnosis not present

## 2015-08-09 DIAGNOSIS — F322 Major depressive disorder, single episode, severe without psychotic features: Secondary | ICD-10-CM | POA: Diagnosis not present

## 2015-08-09 DIAGNOSIS — Z7901 Long term (current) use of anticoagulants: Secondary | ICD-10-CM | POA: Diagnosis not present

## 2015-08-09 DIAGNOSIS — I110 Hypertensive heart disease with heart failure: Secondary | ICD-10-CM | POA: Diagnosis not present

## 2015-08-09 DIAGNOSIS — Z9181 History of falling: Secondary | ICD-10-CM | POA: Diagnosis not present

## 2015-08-09 DIAGNOSIS — I872 Venous insufficiency (chronic) (peripheral): Secondary | ICD-10-CM | POA: Diagnosis not present

## 2015-08-09 DIAGNOSIS — R2689 Other abnormalities of gait and mobility: Secondary | ICD-10-CM | POA: Diagnosis not present

## 2015-08-09 DIAGNOSIS — I69398 Other sequelae of cerebral infarction: Secondary | ICD-10-CM | POA: Diagnosis not present

## 2015-08-09 DIAGNOSIS — M6281 Muscle weakness (generalized): Secondary | ICD-10-CM | POA: Diagnosis not present

## 2015-08-18 ENCOUNTER — Ambulatory Visit
Admission: RE | Admit: 2015-08-18 | Discharge: 2015-08-18 | Disposition: A | Discharge status: Home / Self Care | Payer: PPO | Source: Ambulatory Visit | Attending: Family Medicine | Admitting: Family Medicine

## 2015-08-18 ENCOUNTER — Other Ambulatory Visit: Payer: Self-pay | Admitting: Family Medicine

## 2015-08-18 DIAGNOSIS — J309 Allergic rhinitis, unspecified: Secondary | ICD-10-CM | POA: Diagnosis not present

## 2015-08-18 DIAGNOSIS — M79671 Pain in right foot: Secondary | ICD-10-CM

## 2015-08-18 DIAGNOSIS — I480 Paroxysmal atrial fibrillation: Secondary | ICD-10-CM | POA: Diagnosis not present

## 2015-08-28 DIAGNOSIS — I110 Hypertensive heart disease with heart failure: Secondary | ICD-10-CM | POA: Diagnosis not present

## 2015-08-28 DIAGNOSIS — R2689 Other abnormalities of gait and mobility: Secondary | ICD-10-CM | POA: Diagnosis not present

## 2015-08-28 DIAGNOSIS — Z7901 Long term (current) use of anticoagulants: Secondary | ICD-10-CM | POA: Diagnosis not present

## 2015-08-28 DIAGNOSIS — Z9181 History of falling: Secondary | ICD-10-CM | POA: Diagnosis not present

## 2015-08-28 DIAGNOSIS — I69398 Other sequelae of cerebral infarction: Secondary | ICD-10-CM | POA: Diagnosis not present

## 2015-08-28 DIAGNOSIS — M6281 Muscle weakness (generalized): Secondary | ICD-10-CM | POA: Diagnosis not present

## 2015-08-28 DIAGNOSIS — I5032 Chronic diastolic (congestive) heart failure: Secondary | ICD-10-CM | POA: Diagnosis not present

## 2015-08-28 DIAGNOSIS — I480 Paroxysmal atrial fibrillation: Secondary | ICD-10-CM | POA: Diagnosis not present

## 2015-08-28 DIAGNOSIS — I872 Venous insufficiency (chronic) (peripheral): Secondary | ICD-10-CM | POA: Diagnosis not present

## 2015-08-28 DIAGNOSIS — F322 Major depressive disorder, single episode, severe without psychotic features: Secondary | ICD-10-CM | POA: Diagnosis not present

## 2015-08-31 DIAGNOSIS — Z961 Presence of intraocular lens: Secondary | ICD-10-CM | POA: Diagnosis not present

## 2015-08-31 DIAGNOSIS — H43393 Other vitreous opacities, bilateral: Secondary | ICD-10-CM | POA: Diagnosis not present

## 2015-08-31 DIAGNOSIS — H2512 Age-related nuclear cataract, left eye: Secondary | ICD-10-CM | POA: Diagnosis not present

## 2015-09-04 DIAGNOSIS — G629 Polyneuropathy, unspecified: Secondary | ICD-10-CM | POA: Diagnosis not present

## 2015-09-04 DIAGNOSIS — I878 Other specified disorders of veins: Secondary | ICD-10-CM | POA: Diagnosis not present

## 2015-09-04 DIAGNOSIS — H269 Unspecified cataract: Secondary | ICD-10-CM | POA: Diagnosis not present

## 2015-09-04 DIAGNOSIS — H578 Other specified disorders of eye and adnexa: Secondary | ICD-10-CM | POA: Diagnosis not present

## 2015-10-09 ENCOUNTER — Ambulatory Visit: Payer: PPO | Admitting: Nurse Practitioner

## 2015-10-10 ENCOUNTER — Encounter: Payer: Self-pay | Admitting: Nurse Practitioner

## 2015-10-11 DIAGNOSIS — M150 Primary generalized (osteo)arthritis: Secondary | ICD-10-CM | POA: Diagnosis not present

## 2015-10-11 DIAGNOSIS — I10 Essential (primary) hypertension: Secondary | ICD-10-CM | POA: Diagnosis not present

## 2015-10-11 DIAGNOSIS — M8588 Other specified disorders of bone density and structure, other site: Secondary | ICD-10-CM | POA: Diagnosis not present

## 2015-10-11 DIAGNOSIS — G4733 Obstructive sleep apnea (adult) (pediatric): Secondary | ICD-10-CM | POA: Diagnosis not present

## 2015-10-11 DIAGNOSIS — I5032 Chronic diastolic (congestive) heart failure: Secondary | ICD-10-CM | POA: Diagnosis not present

## 2015-10-11 DIAGNOSIS — I480 Paroxysmal atrial fibrillation: Secondary | ICD-10-CM | POA: Diagnosis not present

## 2015-10-11 DIAGNOSIS — I639 Cerebral infarction, unspecified: Secondary | ICD-10-CM | POA: Diagnosis not present

## 2015-10-11 DIAGNOSIS — Z23 Encounter for immunization: Secondary | ICD-10-CM | POA: Diagnosis not present

## 2015-10-11 DIAGNOSIS — E7800 Pure hypercholesterolemia, unspecified: Secondary | ICD-10-CM | POA: Diagnosis not present

## 2015-10-11 DIAGNOSIS — E213 Hyperparathyroidism, unspecified: Secondary | ICD-10-CM | POA: Diagnosis not present

## 2015-10-11 DIAGNOSIS — Z0000 Encounter for general adult medical examination without abnormal findings: Secondary | ICD-10-CM | POA: Diagnosis not present

## 2015-10-11 DIAGNOSIS — G629 Polyneuropathy, unspecified: Secondary | ICD-10-CM | POA: Diagnosis not present

## 2015-10-17 ENCOUNTER — Encounter (HOSPITAL_COMMUNITY): Payer: Self-pay | Admitting: *Deleted

## 2015-10-17 DIAGNOSIS — Z87891 Personal history of nicotine dependence: Secondary | ICD-10-CM | POA: Insufficient documentation

## 2015-10-17 DIAGNOSIS — Z8673 Personal history of transient ischemic attack (TIA), and cerebral infarction without residual deficits: Secondary | ICD-10-CM | POA: Diagnosis not present

## 2015-10-17 DIAGNOSIS — M25551 Pain in right hip: Secondary | ICD-10-CM | POA: Insufficient documentation

## 2015-10-17 DIAGNOSIS — Z7901 Long term (current) use of anticoagulants: Secondary | ICD-10-CM | POA: Insufficient documentation

## 2015-10-17 DIAGNOSIS — I110 Hypertensive heart disease with heart failure: Secondary | ICD-10-CM | POA: Diagnosis not present

## 2015-10-17 DIAGNOSIS — R102 Pelvic and perineal pain: Secondary | ICD-10-CM | POA: Diagnosis not present

## 2015-10-17 DIAGNOSIS — D259 Leiomyoma of uterus, unspecified: Secondary | ICD-10-CM | POA: Diagnosis not present

## 2015-10-17 DIAGNOSIS — W1839XA Other fall on same level, initial encounter: Secondary | ICD-10-CM | POA: Insufficient documentation

## 2015-10-17 DIAGNOSIS — I5032 Chronic diastolic (congestive) heart failure: Secondary | ICD-10-CM | POA: Insufficient documentation

## 2015-10-17 DIAGNOSIS — S79911A Unspecified injury of right hip, initial encounter: Secondary | ICD-10-CM | POA: Diagnosis not present

## 2015-10-17 DIAGNOSIS — Y9341 Activity, dancing: Secondary | ICD-10-CM | POA: Insufficient documentation

## 2015-10-17 DIAGNOSIS — Y999 Unspecified external cause status: Secondary | ICD-10-CM | POA: Diagnosis not present

## 2015-10-17 DIAGNOSIS — Z96651 Presence of right artificial knee joint: Secondary | ICD-10-CM | POA: Insufficient documentation

## 2015-10-17 DIAGNOSIS — Y9289 Other specified places as the place of occurrence of the external cause: Secondary | ICD-10-CM | POA: Insufficient documentation

## 2015-10-17 DIAGNOSIS — R1031 Right lower quadrant pain: Secondary | ICD-10-CM | POA: Diagnosis not present

## 2015-10-17 NOTE — ED Triage Notes (Signed)
The pt is c/o some lt groin pain after she fell line dancing 1500 today.  She had a stroke recently  Minimal deficites on the rt side of her body

## 2015-10-18 ENCOUNTER — Emergency Department (HOSPITAL_COMMUNITY): Payer: PPO

## 2015-10-18 ENCOUNTER — Emergency Department (HOSPITAL_COMMUNITY)
Admission: EM | Admit: 2015-10-18 | Discharge: 2015-10-18 | Disposition: A | Discharge status: Home / Self Care | Payer: PPO | Attending: Emergency Medicine | Admitting: Emergency Medicine

## 2015-10-18 DIAGNOSIS — R102 Pelvic and perineal pain: Secondary | ICD-10-CM

## 2015-10-18 DIAGNOSIS — R1031 Right lower quadrant pain: Secondary | ICD-10-CM

## 2015-10-18 DIAGNOSIS — D259 Leiomyoma of uterus, unspecified: Secondary | ICD-10-CM | POA: Diagnosis not present

## 2015-10-18 DIAGNOSIS — S79911A Unspecified injury of right hip, initial encounter: Secondary | ICD-10-CM | POA: Diagnosis not present

## 2015-10-18 LAB — CBC WITH DIFFERENTIAL/PLATELET
BASOS PCT: 1 %
Basophils Absolute: 0.1 10*3/uL (ref 0.0–0.1)
EOS ABS: 0.3 10*3/uL (ref 0.0–0.7)
EOS PCT: 7 %
HEMATOCRIT: 36.6 % (ref 36.0–46.0)
HEMOGLOBIN: 12 g/dL (ref 12.0–15.0)
Lymphocytes Relative: 29 %
Lymphs Abs: 1.4 10*3/uL (ref 0.7–4.0)
MCH: 30.5 pg (ref 26.0–34.0)
MCHC: 32.8 g/dL (ref 30.0–36.0)
MCV: 93.1 fL (ref 78.0–100.0)
MONO ABS: 0.5 10*3/uL (ref 0.1–1.0)
Monocytes Relative: 10 %
Neutro Abs: 2.5 10*3/uL (ref 1.7–7.7)
Neutrophils Relative %: 53 %
Platelets: 227 10*3/uL (ref 150–400)
RBC: 3.93 MIL/uL (ref 3.87–5.11)
RDW: 13.1 % (ref 11.5–15.5)
WBC: 4.7 10*3/uL (ref 4.0–10.5)

## 2015-10-18 LAB — TYPE AND SCREEN
ABO/RH(D): B POS
ANTIBODY SCREEN: NEGATIVE

## 2015-10-18 LAB — PROTIME-INR
INR: 1.18
PROTHROMBIN TIME: 15.1 s (ref 11.4–15.2)

## 2015-10-18 LAB — BASIC METABOLIC PANEL
Anion gap: 8 (ref 5–15)
BUN: 18 mg/dL (ref 6–20)
CALCIUM: 10.2 mg/dL (ref 8.9–10.3)
CO2: 27 mmol/L (ref 22–32)
CREATININE: 1.27 mg/dL — AB (ref 0.44–1.00)
Chloride: 105 mmol/L (ref 101–111)
GFR calc Af Amer: 46 mL/min — ABNORMAL LOW (ref >60)
GFR, EST NON AFRICAN AMERICAN: 40 mL/min — AB (ref >60)
Glucose, Bld: 100 mg/dL — ABNORMAL HIGH (ref 65–99)
Potassium: 3.8 mmol/L (ref 3.5–5.1)
SODIUM: 140 mmol/L (ref 135–145)

## 2015-10-18 MED ORDER — FENTANYL CITRATE (PF) 100 MCG/2ML IJ SOLN
50.0000 ug | INTRAMUSCULAR | Status: DC | PRN
  Administered 2015-10-18: 50 ug via INTRAVENOUS
  Filled 2015-10-18 (×2): qty 2

## 2015-10-18 MED ORDER — ONDANSETRON HCL 4 MG/2ML IJ SOLN
4.0000 mg | Freq: Once | INTRAMUSCULAR | Status: AC
  Administered 2015-10-18: 4 mg via INTRAVENOUS
  Filled 2015-10-18: qty 2

## 2015-10-18 MED ORDER — HYDROCODONE-ACETAMINOPHEN 5-325 MG PO TABS: 1.0000 | ORAL_TABLET | Freq: Four times a day (QID) | ORAL | 0 refills | Status: DC | PRN

## 2015-10-18 NOTE — ED Notes (Signed)
Patient was ambulated. Patient tolerated fairly, but voiced pain during ambulation.

## 2015-10-18 NOTE — ED Provider Notes (Signed)
Susan [name] Note   CSN: [id number] Arrival date & time: 10/17/15  2027  By signing my name below, I, Susan [name], attest that this documentation has been prepared under the direction and in the presence of Susan Fraise, MD . Electronically Signed: Higinio [name], Scribe. 10/18/2015. 1:08 AM.  History   Chief Complaint Chief Complaint  Patient presents with  . Groin Pain   The history is provided by the patient. No language interpreter was used.   HPI Comments: Susan [name] is a 75 y.o. female with PMHx of arthritis and stroke, who presents to the Emergency Department complaining of gradually worsening, groin pain s/p a fall that occurred at ~3:00 PM this afternoon. Pt reports she was line dancing this afternoon at her retirement home when she suddenly fell onto her right hip. She states she tried to get up after her fall and realized she could not due to pain in her groin. She denies hitting her head or loss of consciousness. She states her pain is exacerbated when bearing weight, walking and moving her bilateral lower legs. Pt denies right hip pain, headache, chest pain, neck pain and back pain. She states she currently takes Eliquis.   Past Medical History:  Diagnosis Date  . Anxiety   . Arthritis    "legs, back" (07/29/2012)  . Chronic diastolic (congestive) heart failure   . Chronic diastolic (congestive) heart failure   . Chronic lower back pain   . Complication of anesthesia    "I have apnea" (07/29/2012)  . Dementia    per family  . Depression   . Family history of anesthesia complication    "some wake up during OR; some are hard to wake up; some both" (07/29/2012)  . GERD (gastroesophageal reflux disease)   . History of stomach ulcers 1980's  . Hyperlipidemia   . Hypertension   . Incontinent of urine    wears pads  . OSA on CPAP   . Osteoarthritis of right knee   . PAF (paroxysmal atrial fibrillation) (HCC)    CHADS2VASC score is 7 - on chronic  anticoagulation with Apixaban  . Pedal edema   . Primary hyperparathyroidism ([place])   . Spinal stenosis   . Stroke ([place])   . Thyroid disease   . Umbilical hernia    "unrepaired" (07/29/2012)  . Varicose veins    "BLE" (07/29/2012)    Patient Active Problem List   Diagnosis Date Noted  . Edema 02/09/2015  . History of CVA (cerebrovascular accident) 02/01/2015  . 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 ([place])   . 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 Acute ischemic stroke (Left internal capsule) 01/28/2015  . Chronic low back pain 01/28/2015  . Short-term memory loss 01/28/2015  . Volume depletion 01/28/2015  . Paroxysmal atrial fibrillation ([place]) 01/26/2015  . DCM (dilated cardiomyopathy) ([place]) 01/26/2015  . TIA (transient ischemic attack) 01/24/2015  . Chronic diastolic (congestive) heart failure   . Obesity (BMI 30-39.9) 01/27/2013  . Constipation 09/02/2012  . H/O arthroscopy of right knee 08/06/2012  . Anxiety   . Depression   . GERD (gastroesophageal reflux disease)   . Hypertensive heart disease   . OSA (obstructive sleep apnea)   . Osteoarthritis of right knee   . Hyperparathyroidism, primary ([place]) 04/22/2012  . Chronic cough 10/02/2011    Past Surgical History:  Procedure Laterality Date  . CHOLECYSTECTOMY  ~ 2010  . COLONOSCOPY    . IUD REMOVAL  1980's  . PARATHYROIDECTOMY N/A 06/16/2012   Procedure: NECK EXPLORATION AND LEFT SUPERIOR PARATHYROIDECTOMY;  Surgeon: Earnstine Regal, MD;  Location: [facility];  Service: General;  Laterality: N/A;  . 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 TOTAL KNEE ARTHROPLASTY Right 07/29/2012   Procedure: TOTAL KNEE ARTHROPLASTY;  Surgeon: Ninetta Lights, MD;  Location: [facility];  Service: Orthopedics;  Laterality: Right;    OB History    No data available     Home Medications    Prior to Admission medications   Medication Sig Start Date End Date Taking? Authorizing [name]  apixaban  (ELIQUIS) 5 MG TABS tablet Take 1 tablet (5 mg total) by mouth every 12 (twelve) hours. 01/26/15   Ripudeep Krystal Eaton, MD  buPROPion (WELLBUTRIN XL) 150 MG 24 hr tablet Take 450 mg by mouth daily.    Historical Provider, MD  carvedilol (COREG) 6.25 MG tablet Take 1 tablet (6.25 mg total) by mouth 2 (two) times daily with a meal. 05/10/15   Brett Canales, PA-C  cholecalciferol (VITAMIN D) 1000 units tablet Take 1,000 Units by mouth daily.    Historical Provider, MD  ezetimibe (ZETIA) 10 MG tablet Take 1 tablet (10 mg total) by mouth daily. 01/26/15   Ripudeep Krystal Eaton, MD  furosemide (LASIX) 40 MG tablet Take 1 tablet (40 mg total) by mouth daily. 05/22/15   Sueanne Margarita, MD  gabapentin (NEURONTIN) 300 MG capsule Take 600 mg by mouth at bedtime.    Historical Provider, MD  hydrALAZINE (APRESOLINE) 25 MG tablet Take 1 tablet (25 mg total) by mouth 3 (three) times daily. 02/22/15   Liliane Shi, PA-C  Multiple Vitamin (MULTIVITAMIN WITH MINERALS) TABS Take 1 tablet by mouth daily.    Historical Provider, MD  pravastatin (PRAVACHOL) 20 MG tablet Take 2 tablets (40 mg total) by mouth every morning. 04/07/15   Garvin Fila, MD  valsartan (DIOVAN) 320 MG tablet TAKE 1 TABLET BY MOUTH EVERY DAY 10/21/14   Sueanne Margarita, MD  vitamin B-12 (CYANOCOBALAMIN) 100 MCG tablet Take 100 mcg by mouth daily.    Historical Provider, MD    Family History Family History  Problem Relation Age of Onset  . Hypertension Mother     deceased  . Stroke Mother   . Lung cancer Father     deceased    Social History Social History  Substance Use Topics  . Smoking status: Former Smoker    Packs/day: 1.00    Years: 30.00    Types: Cigarettes    Quit date: 01/07/1978  . Smokeless tobacco: Never Used  . Alcohol use No   Allergies   Lipitor [atorvastatin]; Simvastatin; and Ace inhibitors  Review of Systems Review of Systems  Cardiovascular: Negative for chest pain.  Musculoskeletal: Positive for arthralgias. Negative for  back pain and neck pain.  Neurological: Negative for syncope and headaches.  All other systems reviewed and are negative.  Physical Exam Updated Vital Signs BP 149/79 (BP Location: Right Arm)   Pulse (!) 49   Temp 97.7 F (36.5 C) (Oral)   Resp 16   Ht 5\' 1"  (1.549 m)   Wt 200 lb (90.7 kg)   SpO2 99%   BMI 37.79 kg/m   Physical Exam CONSTITUTIONAL: Well developed/well nourished HEAD: Normocephalic/atraumatic ENMT: Mucous membranes moist, no signs of facial trauma NECK: supple no meningeal signs SPINE/BACK:entire spine nontender CV: S1/S2 noted LUNGS: Lungs are clear to auscultation bilaterally, no apparent distress ABDOMEN: soft, nontender, no  rebound or guarding, bowel sounds noted throughout abdomen, chronic abdominal wall hernia, no tenderness to lower abdomen  NEURO: Pt is awake/alert/appropriate, moves all extremitiesx4.  No facial droop.   EXTREMITIES: pulses normal/equal, full ROM, tenderness with ROM of right hip, no deformities noted, no other focal extremity tenderness noted.  Pelvis stable.  No bruising to legs.   SKIN: warm, color normal PSYCH: no abnormalities of mood noted, alert and oriented to situation  ED Treatments / Results  Labs (all labs ordered are listed, but only abnormal results are displayed) Labs Reviewed  BASIC METABOLIC PANEL - Abnormal; Notable for the following:       Result Value   Glucose, Bld 100 (*)    Creatinine, Ser 1.27 (*)    GFR calc non Af Amer 40 (*)    GFR calc Af Amer 46 (*)    All other components within normal limits  CBC WITH DIFFERENTIAL/PLATELET  PROTIME-INR  TYPE AND SCREEN    EKG  EKG Interpretation  Date/Time:  Wednesday October 18 2015 02:12:28 EDT Ventricular Rate:  49 PR Interval:    QRS Duration: 133 QT Interval:  455 QTC Calculation: 411 R Axis:   -58 Text Interpretation:  Sinus bradycardia Nonspecific IVCD with LAD Left ventricular hypertrophy Nonspecific T abnormalities, diffuse leads Confirmed by  Thebes ([id number]) on 10/18/2015 2:17:50 AM       Radiology Ct Pelvis Wo Contrast  Result Date: 10/18/2015 CLINICAL DATA:  Status post fall, landing on right hip, with right hip pain. Initial encounter. EXAM: CT PELVIS WITHOUT CONTRAST TECHNIQUE: Multidetector CT imaging of the pelvis was performed following the standard protocol without intravenous contrast. COMPARISON:  None. FINDINGS: Urinary Tract: The bladder is mildly distended and grossly unremarkable in appearance. A 4 mm stone is suggested at the lower pole of the left kidney. There appears to be a 3.3 cm cyst at the lower pole of the right kidney. Bowel: The appendix is normal in caliber, without evidence of appendicitis. Scattered diverticulosis is noted along the distal descending and sigmoid colon, without evidence of diverticulitis. Remaining visualized small and large bowel loops are grossly unremarkable. An anterior abdominal wall mesh is partially imaged, with anterior bulging about the umbilicus but no evidence of recurrent hernia. Vascular/Lymphatic: Scattered calcification is seen along the distal abdominal aorta and its branches. No retroperitoneal or pelvic sidewall lymphadenopathy is seen. Reproductive: Multiple calcified uterine fibroids are noted. The ovaries are grossly symmetric and unremarkable in appearance. Other:  No additional soft tissue abnormalities are seen. Musculoskeletal: There is no evidence of fracture or dislocation. Both hip joints are symmetric and unremarkable in appearance, without significant joint space narrowing. The superior and inferior pubic rami appear intact bilaterally. Minimal degenerative change is noted at the pubic symphysis. Multilevel vacuum phenomenon is noted along the lower lumbar spine, with underlying facet disease. There is grade 1 anterolisthesis of L4 on L5, reflecting facet disease. Minimal vacuum phenomenon is noted at the right sacroiliac joint. The visualized musculature is  unremarkable in appearance. No hip joint effusion is seen. IMPRESSION: 1. No evidence of fracture or dislocation. 2. Mild degenerative change along the lower lumbar spine. 3. Scattered aortic atherosclerosis noted. 4. Multiple calcified uterine fibroids noted. 5. Scattered diverticulosis along the distal descending and sigmoid colon, without evidence of diverticulitis. 6. 4 mm stone suggested at the lower pole of the left kidney. 3.3 cm right renal cyst noted. 7. Bulging at the level of the anterior abdominal wall mesh, without evidence of  recurrent hernia. Electronically Signed   By: Garald Balding M.D.   On: 10/18/2015 03:58   Dg Hip Unilat With Pelvis 2-3 Views Right  Result Date: 10/18/2015 CLINICAL DATA:  Acute onset of bilateral groin pain after fall while line dancing. Initial encounter. EXAM: DG HIP (WITH OR WITHOUT PELVIS) 2-3V RIGHT COMPARISON:  None. FINDINGS: There is no evidence of fracture or dislocation. Both femoral heads are seated normally within their respective acetabula. The proximal right femur appears intact. No significant degenerative change is appreciated. The sacroiliac joints are unremarkable in appearance. The visualized bowel gas pattern is grossly unremarkable in appearance. Scattered phleboliths are noted within the pelvis. An anterior abdominal wall mesh is noted. IMPRESSION: No evidence of fracture or dislocation. Electronically Signed   By: Garald Balding M.D.   On: 10/18/2015 02:20    Procedures Procedures (including critical care time)  Medications Ordered in ED Medications  fentaNYL (SUBLIMAZE) injection 50 mcg (50 mcg Intravenous Given 10/18/15 0114)  ondansetron (ZOFRAN) injection 4 mg (4 mg Intravenous Given 10/18/15 0113)    DIAGNOSTIC STUDIES:  Oxygen Saturation is 99% on RA, normal by my interpretation.    COORDINATION OF CARE:  1:02 AM Discussed treatment [name] with pt at bedside and pt agreed to [name].  Initial Impression / Assessment and [name] / ED  Course  I have reviewed the triage vital signs and the nursing notes.  Pertinent labs & imaging results that were available during my care of the patient were reviewed by me and considered in my medical decision making (see chart for details).  Clinical Course    I monitored patient for several hours.  She had mechanical fall while dancing and had right hip/groin/pelvic pain She was in no distress and no signs of dislocation Xray negative I spoke to radiology (dr Toney Reil) to determine if I should start with MRI or CT imaging to evaluate for occult fx and he recommended starting with CT imaging to evaluate for occult fracture CT imaging negative Pt now improved She is able to bear weight - she reports pain but endorses that it is improved and she feels comfortable going home She is in no distress I did offer observation admission for pain control and physical therapy After discussion with family, she would still like to go home Home health has been ordered and will hope to have evaluation in next 24-48 hours Short course of vicodin ordered We discussed return precautions Patient/family agreeable with [name]   I personally performed the services described in this documentation, which was scribed in my presence. The recorded information has been reviewed and is accurate.   Final Clinical Impressions(s) / ED Diagnoses   Final diagnoses:  Pelvic pain  Right groin pain    New Prescriptions New Prescriptions   HYDROCODONE-ACETAMINOPHEN (NORCO/VICODIN) 5-325 MG TABLET    Take 1 tablet by mouth every 6 (six) hours as needed for severe pain.     Susan Fraise, MD 10/18/15 0630

## 2015-10-18 NOTE — ED Notes (Signed)
Dr. Wickline at bedside.  

## 2015-10-20 ENCOUNTER — Encounter (HOSPITAL_COMMUNITY): Payer: Self-pay | Admitting: Surgery

## 2015-10-20 NOTE — Progress Notes (Unsigned)
[facility] rferral accepted by [facility] agency, confirmed by [facility] for [facility]. Patient updated.. No further CM needs identified.

## 2015-10-22 DIAGNOSIS — I69351 Hemiplegia and hemiparesis following cerebral infarction affecting right dominant side: Secondary | ICD-10-CM | POA: Diagnosis not present

## 2015-10-22 DIAGNOSIS — I110 Hypertensive heart disease with heart failure: Secondary | ICD-10-CM | POA: Diagnosis not present

## 2015-10-22 DIAGNOSIS — I5032 Chronic diastolic (congestive) heart failure: Secondary | ICD-10-CM | POA: Diagnosis not present

## 2015-10-22 DIAGNOSIS — Z9181 History of falling: Secondary | ICD-10-CM | POA: Diagnosis not present

## 2015-10-22 DIAGNOSIS — I480 Paroxysmal atrial fibrillation: Secondary | ICD-10-CM | POA: Diagnosis not present

## 2015-10-22 DIAGNOSIS — Z7901 Long term (current) use of anticoagulants: Secondary | ICD-10-CM | POA: Diagnosis not present

## 2015-10-22 DIAGNOSIS — F0390 Unspecified dementia without behavioral disturbance: Secondary | ICD-10-CM | POA: Diagnosis not present

## 2015-10-23 DIAGNOSIS — M25552 Pain in left hip: Secondary | ICD-10-CM | POA: Diagnosis not present

## 2015-10-25 DIAGNOSIS — I69351 Hemiplegia and hemiparesis following cerebral infarction affecting right dominant side: Secondary | ICD-10-CM | POA: Diagnosis not present

## 2015-10-25 DIAGNOSIS — I5032 Chronic diastolic (congestive) heart failure: Secondary | ICD-10-CM | POA: Diagnosis not present

## 2015-10-25 DIAGNOSIS — I110 Hypertensive heart disease with heart failure: Secondary | ICD-10-CM | POA: Diagnosis not present

## 2015-10-25 DIAGNOSIS — F0390 Unspecified dementia without behavioral disturbance: Secondary | ICD-10-CM | POA: Diagnosis not present

## 2015-10-25 DIAGNOSIS — Z7901 Long term (current) use of anticoagulants: Secondary | ICD-10-CM | POA: Diagnosis not present

## 2015-10-25 DIAGNOSIS — Z9181 History of falling: Secondary | ICD-10-CM | POA: Diagnosis not present

## 2015-10-25 DIAGNOSIS — I480 Paroxysmal atrial fibrillation: Secondary | ICD-10-CM | POA: Diagnosis not present

## 2015-10-30 ENCOUNTER — Ambulatory Visit: Payer: PPO | Admitting: Nurse Practitioner

## 2015-10-31 ENCOUNTER — Encounter: Payer: Self-pay | Admitting: Nurse Practitioner

## 2015-10-31 ENCOUNTER — Ambulatory Visit (INDEPENDENT_AMBULATORY_CARE_PROVIDER_SITE_OTHER): Payer: PPO | Admitting: Nurse Practitioner

## 2015-10-31 VITALS — BP 146/80 | HR 54 | Ht 61.0 in | Wt 199.8 lb

## 2015-10-31 DIAGNOSIS — G459 Transient cerebral ischemic attack, unspecified: Secondary | ICD-10-CM | POA: Diagnosis not present

## 2015-10-31 DIAGNOSIS — I639 Cerebral infarction, unspecified: Secondary | ICD-10-CM | POA: Diagnosis not present

## 2015-10-31 DIAGNOSIS — I480 Paroxysmal atrial fibrillation: Secondary | ICD-10-CM

## 2015-10-31 NOTE — Patient Instructions (Signed)
Continue follow-up was for secondary stroke prevention Blood pressure goal 130/90, today's reading 146/80 continue blood pressure medications LDL cholesterol goal below 70 continue Pravachol Regular exercise ideal body weight Use walker or cane if necessary  follow-up in 6 months, if stable will discharge

## 2015-10-31 NOTE — Progress Notes (Signed)
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 She is currently on Eliquis for secondary stroke prevention and atrial fibrillation. Blood pressure in the office today 146/80. She is on Pravachol for her elevated lipids. She returns today without further stroke or TIA symptoms. She is currently getting physical therapy, she had a fall while line dancing. She returns for reevaluation   04/06/15 PS75 lady seen today for first office follow-up visit following hospital admission for TIA and stroke in January 2017. She is accompanied by her granddaughter today. [name] is an 75 y.o. female with a past medical history significant for HLD, HTN, chronic congestive heart failure, hypothyroidism, OSA, depression, anxiety, brought in by family due to increasing weakness and inability to walk. Of note, patient was released from [facility] on 01/26/15 with diagnosis of TIA (had dizziness and right sided weakness) and had comprehensive TIA work up that was significant only for TEE with EF 30-35%, severly dilated left atrium, diffuse hypokinesis, but no mural thrombus seen. She was discharged home on apixaban for atrial fibrillation history.. Patient lives by herself and her daughter is at the bedside stating that she talked to her mother on the phone last night and her speech was not normal " like having a thick tongue". Then, this morning she noted that the speech was worse, the right face was drooping, and she couldn't use the right arm properly. Similarly, patient was so weak this morning that couldn't participate on her home PT session. Denies HA, vertigo, double vision, difficulty swallowing, or vision  impairment. Endorses feeling very fatigue since yesterday.MRI brain was personally reviewed and showed a small acute nonhemorrhagic infarct posterior limb left internal capsule. Date last known well: unclear Time last known well: unclear Tpa given: no, late presentation MRI scan of the brain on 01/28/15 showed small acute left posterior limb internal capsule infarct and mild to moderate changes of chronic small vessel disease. MRA of the brain showed a high-grade left M1 and M2 branch stenosis. 2-D echo showed decreased ejection fraction of 3035% but no clot. Carotid ultrasound showed no significant extracranial stenosis. LDL cholesterol was elevated at 122. Hemoglobin A1c was 6.1. Patient was transferred to rehabilitation twice in place where she stood for 2 weeks and now she is currently at home. She can walk short distances indoors with a cane and uses a walker for long distances. She states she is careful she had no falls. She remains on eliquis which is tolerating well without bleeding or bruising. She states her blood pressure is quite good at home though today it is slightly elevated at 147/73. She has not had any follow-up lipid profile checked. She is tolerating Pravachol without any side effects  REVIEW OF SYSTEMS: Full 14 system review of systems performed and notable only for those listed, all others are neg:  Constitutional: neg  Cardiovascular: neg Ear/Nose/Throat: neg  Skin: neg Eyes: neg Respiratory: neg Gastroitestinal: neg  Hematology/Lymphatic: neg  Endocrine: neg Musculoskeletal:neg Allergy/Immunology: neg Neurological: neg Psychiatric: neg Sleep : neg   ALLERGIES: Allergies  Allergen Reactions  . Lipitor [Atorvastatin] Swelling  . Simvastatin Other (See Comments)    Myalgias     . Ace Inhibitors Cough    HOME  MEDICATIONS: Outpatient Medications Prior to Visit  Medication Sig Dispense Refill  . apixaban (ELIQUIS) 5 MG TABS tablet Take 1 tablet (5 mg total) by  mouth every 12 (twelve) hours. 60 tablet 4  . buPROPion (WELLBUTRIN XL) 150 MG 24 hr tablet Take 450 mg by mouth daily.    . carvedilol (COREG) 6.25 MG tablet Take 1 tablet (6.25 mg total) by mouth 2 (two) times daily with a meal. 60 tablet 11  . cholecalciferol (VITAMIN D) 1000 units tablet Take 1,000 Units by mouth daily.    Marland Kitchen ezetimibe (ZETIA) 10 MG tablet Take 1 tablet (10 mg total) by mouth daily. 30 tablet 3  . furosemide (LASIX) 40 MG tablet Take 1 tablet (40 mg total) by mouth daily. 90 tablet 3  . gabapentin (NEURONTIN) 300 MG capsule Take 600 mg by mouth at bedtime.    . hydrALAZINE (APRESOLINE) 25 MG tablet Take 1 tablet (25 mg total) by mouth 3 (three) times daily. 270 tablet 3  . HYDROcodone-acetaminophen (NORCO/VICODIN) 5-325 MG tablet Take 1 tablet by mouth every 6 (six) hours as needed for severe pain. 5 tablet 0  . Multiple Vitamin (MULTIVITAMIN WITH MINERALS) TABS Take 1 tablet by mouth daily.    . pravastatin (PRAVACHOL) 20 MG tablet Take 2 tablets (40 mg total) by mouth every morning. 60 tablet 3  . valsartan (DIOVAN) 320 MG tablet TAKE 1 TABLET BY MOUTH EVERY DAY 30 tablet 11  . vitamin B-12 (CYANOCOBALAMIN) 100 MCG tablet Take 100 mcg by mouth daily.     No facility-administered medications prior to visit.     PAST MEDICAL HISTORY: Past Medical History:  Diagnosis Date  . Anxiety   . Arthritis    "legs, back" (07/29/2012)  . Chronic diastolic (congestive) heart failure   . Chronic diastolic (congestive) heart failure   . Chronic lower back pain   . Complication of anesthesia    "I have apnea" (07/29/2012)  . Dementia    per family  . Depression   . Family history of anesthesia complication    "some wake up during OR; some are hard to wake up; some both" (07/29/2012)  . GERD (gastroesophageal reflux disease)   . History of stomach ulcers 1980's  . Hyperlipidemia   . Hypertension   . Incontinent of urine    wears pads  . OSA on CPAP   . Osteoarthritis of right  knee   . PAF (paroxysmal atrial fibrillation) (HCC)    CHADS2VASC score is 7 - on chronic anticoagulation with Apixaban  . Pedal edema   . Primary hyperparathyroidism ([place])   . Spinal stenosis   . Stroke ([place])   . Thyroid disease   . Umbilical hernia    "unrepaired" (07/29/2012)  . Varicose veins    "BLE" (07/29/2012)    PAST SURGICAL HISTORY: Past Surgical History:  Procedure Laterality Date  . CHOLECYSTECTOMY  ~ 2010  . COLONOSCOPY    . IUD REMOVAL  1980's  . PARATHYROIDECTOMY N/A 06/16/2012   Procedure: NECK EXPLORATION AND LEFT SUPERIOR PARATHYROIDECTOMY;  Surgeon: Earnstine Regal, MD;  Location: [facility];  Service: General;  Laterality: N/A;  . 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 TOTAL KNEE ARTHROPLASTY Right 07/29/2012   Procedure: TOTAL KNEE ARTHROPLASTY;  Surgeon: Ninetta Lights, MD;  Location: [facility];  Service: Orthopedics;  Laterality: Right;    FAMILY HISTORY: Family History  Problem Relation Age of Onset  . Hypertension Mother     deceased  . Stroke Mother   .  Lung cancer Father     deceased    SOCIAL HISTORY: Social History   Social History  . Marital status: Widowed    Spouse name: N/A  . Number of children: N/A  . Years of education: N/A   Occupational History  . retired Retired   Social History Main Topics  . Smoking status: Former Smoker    Packs/day: 1.00    Years: 30.00    Types: Cigarettes    Quit date: 01/07/1978  . Smokeless tobacco: Never Used  . Alcohol use No  . Drug use: No  . Sexual activity: No   Other Topics Concern  . Not on file   Social History Narrative  . No narrative on file     PHYSICAL EXAM  Vitals:   10/31/15 1403  BP: (!) 146/80  Pulse: (!) 54  Weight: 199 lb 12.8 oz (90.6 kg)  Height: 5\' 1"  (1.549 m)   Body mass index is 37.75 kg/m. General: well developed, well nourished elderly lady , seated, in no evident distress Head: head normocephalic and atraumatic.  Neck: supple with no carotid  bruits Cardiovascular: regular rate and rhythm, no murmurs Musculoskeletal: no deformity Skin:  no rash/petichiae Vascular:  Normal pulses all extremities  Neurological examination  Mental Status: Awake and fully alert. Oriented to place and time. Recent and remote memory intact. Attention span, concentration and fund of knowledge appropriate. Mood and affect appropriate.  Cranial Nerves: Pupils equal, briskly reactive to light. Extraocular movements full without nystagmus. Visual fields full to confrontation. Hearing intact. Facial sensation intact. Face, tongue, palate moves normally and symmetrically.  Motor: Normal bulk and tone. Normal strength in all tested extremity muscles. Diminished fine finger movements on the right. Orbits left over right upper extremity.  Sensory.: intact to touch ,pinprick .position and vibratory sensation in the upper and lower extremities.  Coordination: Rapid alternating movements normal in all extremities. Finger-to-nose and heel-to-shin performed accurately bilaterally. Gait and Station: Arises from chair without difficulty. Stance is normal. Gait demonstrates normal stride length and balance . Able to heel, toe and tandem walk with mild difficulty. Ambulates with a rolling walker Reflexes: 1+ and symmetric. Toes downgoing.   DIAGNOSTIC DATA (LABS, IMAGING, TESTING) - I reviewed patient records, labs, notes, testing and imaging myself where available.  Lab Results  Component Value Date   WBC 4.7 10/18/2015   HGB 12.0 10/18/2015   HCT 36.6 10/18/2015   MCV 93.1 10/18/2015   PLT 227 10/18/2015      Component Value Date/Time   NA 140 10/18/2015 0120   K 3.8 10/18/2015 0120   CL 105 10/18/2015 0120   CO2 27 10/18/2015 0120   GLUCOSE 100 (H) 10/18/2015 0120   BUN 18 10/18/2015 0120   CREATININE 1.27 (H) 10/18/2015 0120   CREATININE 1.03 (H) 05/29/2015 1541   CALCIUM 10.2 10/18/2015 0120   PROT 6.6 04/06/2015 1136   ALBUMIN 4.2 04/06/2015 1136    AST 25 04/06/2015 1136   ALT 23 04/06/2015 1136   ALKPHOS 64 04/06/2015 1136   BILITOT 0.6 04/06/2015 1136   GFRNONAA 40 (L) 10/18/2015 0120   GFRAA 46 (L) 10/18/2015 0120   Lab Results  Component Value Date   CHOL 215 (H) 04/06/2015   HDL 63 04/06/2015   LDLCALC 128 (H) 04/06/2015   TRIG 120 04/06/2015   CHOLHDL 3.4 04/06/2015   Lab Results  Component Value Date   HGBA1C 6.1 (H) 01/25/2015    ASSESSMENT AND PLAN 51 year lady with  left brain subcortical infarct in January 2017 secondary to small vessel disease. Vascular risk factors of hypertension hyperlipidemia and age And atrial fibrillation   Continue follow-up for secondary stroke prevention Blood pressure goal 130/90, today's reading 146/80 continue blood pressure medications LDL cholesterol goal below 70 continue Pravachol Regular exercise ideal body weight Use walker or cane if necessary  follow-up in 6 months, if stable will discharge Dennie Bible, [facility], [place], [facility] [address] [place], [place] [address] [phone]

## 2015-11-02 NOTE — Progress Notes (Signed)
I agree with the above plan 

## 2015-11-09 DIAGNOSIS — I480 Paroxysmal atrial fibrillation: Secondary | ICD-10-CM | POA: Diagnosis not present

## 2015-11-09 DIAGNOSIS — I69351 Hemiplegia and hemiparesis following cerebral infarction affecting right dominant side: Secondary | ICD-10-CM | POA: Diagnosis not present

## 2015-11-09 DIAGNOSIS — I5032 Chronic diastolic (congestive) heart failure: Secondary | ICD-10-CM | POA: Diagnosis not present

## 2015-11-09 DIAGNOSIS — Z9181 History of falling: Secondary | ICD-10-CM | POA: Diagnosis not present

## 2015-11-09 DIAGNOSIS — Z7901 Long term (current) use of anticoagulants: Secondary | ICD-10-CM | POA: Diagnosis not present

## 2015-11-09 DIAGNOSIS — I110 Hypertensive heart disease with heart failure: Secondary | ICD-10-CM | POA: Diagnosis not present

## 2015-11-09 DIAGNOSIS — F0390 Unspecified dementia without behavioral disturbance: Secondary | ICD-10-CM | POA: Diagnosis not present

## 2015-11-14 DIAGNOSIS — I110 Hypertensive heart disease with heart failure: Secondary | ICD-10-CM | POA: Diagnosis not present

## 2015-11-14 DIAGNOSIS — Z7901 Long term (current) use of anticoagulants: Secondary | ICD-10-CM | POA: Diagnosis not present

## 2015-11-14 DIAGNOSIS — I5032 Chronic diastolic (congestive) heart failure: Secondary | ICD-10-CM | POA: Diagnosis not present

## 2015-11-14 DIAGNOSIS — I480 Paroxysmal atrial fibrillation: Secondary | ICD-10-CM | POA: Diagnosis not present

## 2015-11-14 DIAGNOSIS — F0390 Unspecified dementia without behavioral disturbance: Secondary | ICD-10-CM | POA: Diagnosis not present

## 2015-11-14 DIAGNOSIS — I69351 Hemiplegia and hemiparesis following cerebral infarction affecting right dominant side: Secondary | ICD-10-CM | POA: Diagnosis not present

## 2015-11-14 DIAGNOSIS — Z9181 History of falling: Secondary | ICD-10-CM | POA: Diagnosis not present

## 2015-11-16 DIAGNOSIS — M8588 Other specified disorders of bone density and structure, other site: Secondary | ICD-10-CM | POA: Diagnosis not present

## 2015-11-20 ENCOUNTER — Emergency Department (HOSPITAL_COMMUNITY)
Admission: EM | Admit: 2015-11-20 | Discharge: 2015-11-22 | Disposition: A | Discharge status: Other Acute Inpatient Hospital | Payer: PPO | Attending: Emergency Medicine | Admitting: Emergency Medicine

## 2015-11-20 ENCOUNTER — Encounter (HOSPITAL_COMMUNITY): Payer: Self-pay | Admitting: Emergency Medicine

## 2015-11-20 DIAGNOSIS — R45851 Suicidal ideations: Secondary | ICD-10-CM

## 2015-11-20 DIAGNOSIS — Z79899 Other long term (current) drug therapy: Secondary | ICD-10-CM | POA: Insufficient documentation

## 2015-11-20 DIAGNOSIS — Z888 Allergy status to other drugs, medicaments and biological substances status: Secondary | ICD-10-CM | POA: Diagnosis not present

## 2015-11-20 DIAGNOSIS — Z823 Family history of stroke: Secondary | ICD-10-CM | POA: Diagnosis not present

## 2015-11-20 DIAGNOSIS — F332 Major depressive disorder, recurrent severe without psychotic features: Secondary | ICD-10-CM | POA: Diagnosis not present

## 2015-11-20 DIAGNOSIS — I5032 Chronic diastolic (congestive) heart failure: Secondary | ICD-10-CM | POA: Diagnosis not present

## 2015-11-20 DIAGNOSIS — Z9889 Other specified postprocedural states: Secondary | ICD-10-CM | POA: Diagnosis not present

## 2015-11-20 DIAGNOSIS — Z87891 Personal history of nicotine dependence: Secondary | ICD-10-CM | POA: Diagnosis not present

## 2015-11-20 DIAGNOSIS — F329 Major depressive disorder, single episode, unspecified: Secondary | ICD-10-CM

## 2015-11-20 DIAGNOSIS — Z8673 Personal history of transient ischemic attack (TIA), and cerebral infarction without residual deficits: Secondary | ICD-10-CM | POA: Insufficient documentation

## 2015-11-20 DIAGNOSIS — Z049 Encounter for examination and observation for unspecified reason: Secondary | ICD-10-CM

## 2015-11-20 DIAGNOSIS — Z7901 Long term (current) use of anticoagulants: Secondary | ICD-10-CM | POA: Diagnosis not present

## 2015-11-20 DIAGNOSIS — Z8249 Family history of ischemic heart disease and other diseases of the circulatory system: Secondary | ICD-10-CM | POA: Diagnosis not present

## 2015-11-20 DIAGNOSIS — I110 Hypertensive heart disease with heart failure: Secondary | ICD-10-CM | POA: Diagnosis not present

## 2015-11-20 DIAGNOSIS — R05 Cough: Secondary | ICD-10-CM | POA: Diagnosis not present

## 2015-11-20 DIAGNOSIS — Z96651 Presence of right artificial knee joint: Secondary | ICD-10-CM | POA: Insufficient documentation

## 2015-11-20 DIAGNOSIS — F32A Depression, unspecified: Secondary | ICD-10-CM

## 2015-11-20 DIAGNOSIS — Z801 Family history of malignant neoplasm of trachea, bronchus and lung: Secondary | ICD-10-CM | POA: Diagnosis not present

## 2015-11-20 DIAGNOSIS — G4733 Obstructive sleep apnea (adult) (pediatric): Secondary | ICD-10-CM | POA: Diagnosis not present

## 2015-11-20 LAB — CBC
HCT: 36.9 % (ref 36.0–46.0)
Hemoglobin: 12.4 g/dL (ref 12.0–15.0)
MCH: 31.3 pg (ref 26.0–34.0)
MCHC: 33.6 g/dL (ref 30.0–36.0)
MCV: 93.2 fL (ref 78.0–100.0)
Platelets: 242 10*3/uL (ref 150–400)
RBC: 3.96 MIL/uL (ref 3.87–5.11)
RDW: 13 % (ref 11.5–15.5)
WBC: 3.5 10*3/uL — ABNORMAL LOW (ref 4.0–10.5)

## 2015-11-20 LAB — COMPREHENSIVE METABOLIC PANEL
ALK PHOS: 72 U/L (ref 38–126)
ALT: 23 U/L (ref 14–54)
ANION GAP: 6 (ref 5–15)
AST: 26 U/L (ref 15–41)
Albumin: 4.3 g/dL (ref 3.5–5.0)
BILIRUBIN TOTAL: 1 mg/dL (ref 0.3–1.2)
BUN: 21 mg/dL — ABNORMAL HIGH (ref 6–20)
CALCIUM: 10.3 mg/dL (ref 8.9–10.3)
CO2: 29 mmol/L (ref 22–32)
CREATININE: 1.28 mg/dL — AB (ref 0.44–1.00)
Chloride: 107 mmol/L (ref 101–111)
GFR, EST AFRICAN AMERICAN: 46 mL/min — AB (ref >60)
GFR, EST NON AFRICAN AMERICAN: 39 mL/min — AB (ref >60)
Glucose, Bld: 82 mg/dL (ref 65–99)
Potassium: 3.7 mmol/L (ref 3.5–5.1)
Sodium: 142 mmol/L (ref 135–145)
TOTAL PROTEIN: 7.5 g/dL (ref 6.5–8.1)

## 2015-11-20 LAB — RAPID URINE DRUG SCREEN, HOSP PERFORMED
Amphetamines: NOT DETECTED
BARBITURATES: NOT DETECTED
Benzodiazepines: NOT DETECTED
COCAINE: NOT DETECTED
OPIATES: NOT DETECTED
Tetrahydrocannabinol: NOT DETECTED

## 2015-11-20 LAB — TSH: TSH: 1.293 u[IU]/mL (ref 0.350–4.500)

## 2015-11-20 LAB — ACETAMINOPHEN LEVEL

## 2015-11-20 LAB — ETHANOL: Alcohol, Ethyl (B): <5 mg/dL (ref <5)

## 2015-11-20 LAB — SALICYLATE LEVEL

## 2015-11-20 MED ORDER — HYDRALAZINE HCL 25 MG PO TABS
25.0000 mg | ORAL_TABLET | Freq: Three times a day (TID) | ORAL | Status: DC
  Administered 2015-11-20 – 2015-11-22 (×5): 25 mg via ORAL
  Filled 2015-11-20 (×6): qty 1

## 2015-11-20 MED ORDER — HYDROCODONE-ACETAMINOPHEN 5-325 MG PO TABS: 1.0000 | ORAL_TABLET | Freq: Four times a day (QID) | ORAL | Status: DC | PRN

## 2015-11-20 MED ORDER — IRBESARTAN 300 MG PO TABS
300.0000 mg | ORAL_TABLET | Freq: Every day | ORAL | Status: DC
  Administered 2015-11-21 – 2015-11-22 (×2): 300 mg via ORAL
  Filled 2015-11-20 (×2): qty 1

## 2015-11-20 MED ORDER — GABAPENTIN 300 MG PO CAPS
300.0000 mg | ORAL_CAPSULE | Freq: Two times a day (BID) | ORAL | Status: DC
  Administered 2015-11-20 – 2015-11-22 (×4): 300 mg via ORAL
  Filled 2015-11-20 (×4): qty 1

## 2015-11-20 MED ORDER — APIXABAN 5 MG PO TABS
5.0000 mg | ORAL_TABLET | Freq: Two times a day (BID) | ORAL | Status: DC
  Administered 2015-11-20 – 2015-11-22 (×4): 5 mg via ORAL
  Filled 2015-11-20 (×4): qty 1

## 2015-11-20 MED ORDER — FUROSEMIDE 20 MG PO TABS
20.0000 mg | ORAL_TABLET | Freq: Two times a day (BID) | ORAL | Status: DC
  Administered 2015-11-21 – 2015-11-22 (×3): 20 mg via ORAL
  Filled 2015-11-20 (×5): qty 1

## 2015-11-20 MED ORDER — CARVEDILOL 6.25 MG PO TABS
6.2500 mg | ORAL_TABLET | Freq: Two times a day (BID) | ORAL | Status: DC
  Administered 2015-11-21 (×2): 6.25 mg via ORAL
  Filled 2015-11-20 (×4): qty 1

## 2015-11-20 MED ORDER — ACETAMINOPHEN 500 MG PO TABS: 500.0000 mg | ORAL_TABLET | Freq: Four times a day (QID) | ORAL | Status: DC | PRN

## 2015-11-20 MED ORDER — BUPROPION HCL ER (XL) 150 MG PO TB24
450.0000 mg | ORAL_TABLET | Freq: Every day | ORAL | Status: DC
  Administered 2015-11-21 – 2015-11-22 (×2): 450 mg via ORAL
  Filled 2015-11-20 (×2): qty 3

## 2015-11-20 NOTE — ED Provider Notes (Signed)
[facility] DEPT Provider Note   CSN: [id number] Arrival date & time: 11/20/15  1518     History   Chief Complaint Chief Complaint  Patient presents with  . Suicidal    HPI Susan [name] is a 75 y.o. female.  HPI  76 year old female presents for evaluation of suicidal thoughts. Patient has had depression for "years" and has intermittently had brief suicidal thoughts. However the last 2 days she's been having more and more thoughts. She does not want to kill herself but keeps having these thoughts. No plan to kill her self. Patient has also been fatigued for months. She denies any current or recent chest pain, shortness of breath, vomiting, diarrhea, or fevers. She was started or physical therapist and mentioned the suicidal thoughts and was thus told to come to the ER to be checked out. She is on medicine for depression which she takes intermittently.  Past Medical History:  Diagnosis Date  . Anxiety   . Arthritis    "legs, back" (07/29/2012)  . Chronic diastolic (congestive) heart failure   . Chronic diastolic (congestive) heart failure   . Chronic lower back pain   . Complication of anesthesia    "I have apnea" (07/29/2012)  . Dementia    per family  . Depression   . Family history of anesthesia complication    "some wake up during OR; some are hard to wake up; some both" (07/29/2012)  . GERD (gastroesophageal reflux disease)   . History of stomach ulcers 1980's  . Hyperlipidemia   . Hypertension   . Incontinent of urine    wears pads  . OSA on CPAP   . Osteoarthritis of right knee   . PAF (paroxysmal atrial fibrillation) (HCC)    CHADS2VASC score is 7 - on chronic anticoagulation with Apixaban  . Pedal edema   . Primary hyperparathyroidism ([place])   . Spinal stenosis   . Stroke ([facility])   . Thyroid disease   . Umbilical hernia    "unrepaired" (07/29/2012)  . Varicose veins    "BLE" (07/29/2012)    Patient Active Problem List   Diagnosis Date Noted  . Edema  02/09/2015  . History of CVA (cerebrovascular accident) 02/01/2015  . 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 ([place])   . 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)   . Acute ischemic stroke (Left internal capsule) 01/28/2015  . Chronic low back pain 01/28/2015  . Short-term memory loss 01/28/2015  . Volume depletion 01/28/2015  . Paroxysmal atrial fibrillation ([place]) 01/26/2015  . DCM (dilated cardiomyopathy) ([place]) 01/26/2015  . TIA (transient ischemic attack) 01/24/2015  . Chronic diastolic (congestive) heart failure   . Obesity (BMI 30-39.9) 01/27/2013  . Constipation 09/02/2012  . H/O arthroscopy of right knee 08/06/2012  . Anxiety   . Depression   . GERD (gastroesophageal reflux disease)   . Hypertensive heart disease   . OSA (obstructive sleep apnea)   . Osteoarthritis of right knee   . Hyperparathyroidism, primary ([place]) 04/22/2012  . Chronic cough 10/02/2011    Past Surgical History:  Procedure Laterality Date  . CHOLECYSTECTOMY  ~ 2010  . COLONOSCOPY    . IUD REMOVAL  1980's  . PARATHYROIDECTOMY N/A 06/16/2012   Procedure: NECK EXPLORATION AND LEFT SUPERIOR PARATHYROIDECTOMY;  Surgeon: Earnstine Regal, MD;  Location: [facility];  Service: General;  Laterality: N/A;  . 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 TOTAL KNEE ARTHROPLASTY Right 07/29/2012   Procedure: TOTAL KNEE ARTHROPLASTY;  Surgeon: Ninetta Lights, MD;  Location: [facility];  Service: Orthopedics;  Laterality: Right;    OB History    No data available       Home Medications    Prior to Admission medications   Medication Sig Start Date End Date Taking? Authorizing Provider  acetaminophen (TYLENOL) 500 MG tablet Take 500 mg by mouth every 6 (six) hours as needed for moderate pain.   Yes Historical Provider, MD  apixaban (ELIQUIS) 5 MG TABS tablet Take 1 tablet (5 mg total) by mouth every 12 (twelve) hours. 01/26/15  Yes Ripudeep Krystal Eaton, MD  buPROPion (WELLBUTRIN XL) 150 MG 24 hr  tablet Take 450 mg by mouth daily.   Yes Historical Provider, MD  carvedilol (COREG) 6.25 MG tablet Take 1 tablet (6.25 mg total) by mouth 2 (two) times daily with a meal. 05/10/15  Yes Brett Canales, PA-C  cholecalciferol (VITAMIN D) 1000 units tablet Take 1,000 Units by mouth daily.   Yes Historical Provider, MD  furosemide (LASIX) 20 MG tablet Take 20 mg by mouth 2 (two) times daily.  09/22/15  Yes Historical Provider, MD  furosemide (LASIX) 40 MG tablet Take 1 tablet (40 mg total) by mouth daily. 05/22/15  Yes Sueanne Margarita, MD  gabapentin (NEURONTIN) 300 MG capsule Take 300 mg by mouth 2 (two) times daily.    Yes Historical Provider, MD  hydrALAZINE (APRESOLINE) 25 MG tablet Take 1 tablet (25 mg total) by mouth 3 (three) times daily. 02/22/15  Yes Liliane Shi, PA-C  HYDROcodone-acetaminophen (NORCO/VICODIN) 5-325 MG tablet Take 1 tablet by mouth every 6 (six) hours as needed for severe pain. 10/18/15  Yes [name], MD  Multiple Vitamin (MULTIVITAMIN WITH MINERALS) TABS Take 1 tablet by mouth daily.   Yes Historical Provider, MD  pravastatin (PRAVACHOL) 40 MG tablet Take 40 mg by mouth daily.  11/01/15  Yes Historical Provider, MD  valsartan (DIOVAN) 320 MG tablet TAKE 1 TABLET BY MOUTH EVERY DAY 10/21/14  Yes Sueanne Margarita, MD  vitamin B-12 (CYANOCOBALAMIN) 100 MCG tablet Take 100 mcg by mouth daily.   Yes Historical Provider, MD  ezetimibe (ZETIA) 10 MG tablet Take 1 tablet (10 mg total) by mouth daily. Patient not taking: Reported on 11/20/2015 01/26/15   Ripudeep Krystal Eaton, MD  HYDROcodone-acetaminophen (NORCO/VICODIN) 5-325 MG tablet Take 1 tablet by mouth every 6 (six) hours as needed for moderate pain.    Historical Provider, MD  pravastatin (PRAVACHOL) 20 MG tablet Take 2 tablets (40 mg total) by mouth every morning. Patient not taking: Reported on 11/20/2015 04/07/15   Garvin Fila, MD    Family History Family History  Problem Relation Age of Onset  . Hypertension Mother      deceased  . Stroke Mother   . Lung cancer Father     deceased    Social History Social History  Substance Use Topics  . Smoking status: Former Smoker    Packs/day: 1.00    Years: 30.00    Types: Cigarettes    Quit date: 01/07/1978  . Smokeless tobacco: Never Used  . Alcohol use No     Allergies   Lipitor [atorvastatin]; Simvastatin; and Ace inhibitors   Review of Systems Review of Systems  Constitutional: Positive for fatigue. Negative for fever.  Respiratory: Negative for shortness of breath.   Cardiovascular: Negative for chest pain.  Gastrointestinal: Negative for abdominal pain and vomiting.  Psychiatric/Behavioral: Positive for dysphoric mood and suicidal ideas.  All other  systems reviewed and are negative.    Physical Exam Updated Vital Signs BP (!) 154/54 (BP Location: Right Arm)   Pulse 61   Temp 98.2 F (36.8 C) (Oral)   Resp 18   SpO2 99%   Physical Exam  Constitutional: She is oriented to person, place, and time. She appears well-developed and well-nourished.  HENT:  Head: Normocephalic and atraumatic.  Right Ear: External ear normal.  Left Ear: External ear normal.  Nose: Nose normal.  Eyes: Right eye exhibits no discharge. Left eye exhibits no discharge.  Cardiovascular: Normal rate, regular rhythm and normal heart sounds.   Pulmonary/Chest: Effort normal and breath sounds normal.  Abdominal: Soft. There is no tenderness.  Neurological: She is alert and oriented to person, place, and time.  Skin: Skin is warm and dry.  Psychiatric: She expresses suicidal ideation. She expresses no suicidal plans.  Nursing note and vitals reviewed.    ED Treatments / Results  Labs (all labs ordered are listed, but only abnormal results are displayed) Labs Reviewed  COMPREHENSIVE METABOLIC PANEL - Abnormal; Notable for the following:       Result Value   BUN 21 (*)    Creatinine, Ser 1.28 (*)    GFR calc non Af Amer 39 (*)    GFR calc Af Amer 46 (*)     All other components within normal limits  ACETAMINOPHEN LEVEL - Abnormal; Notable for the following:    Acetaminophen (Tylenol), Serum <10 (*)    All other components within normal limits  CBC - Abnormal; Notable for the following:    WBC 3.5 (*)    All other components within normal limits  ETHANOL  SALICYLATE LEVEL  RAPID URINE DRUG SCREEN, HOSP PERFORMED  TSH    EKG  EKG Interpretation None       Radiology No results found.  Procedures Procedures (including critical care time)  Medications Ordered in ED Medications  HYDROcodone-acetaminophen (NORCO/VICODIN) 5-325 MG per tablet 1 tablet (not administered)  acetaminophen (TYLENOL) tablet 500 mg (not administered)  apixaban (ELIQUIS) tablet 5 mg (5 mg Oral Given 11/20/15 2143)  buPROPion (WELLBUTRIN XL) 24 hr tablet 450 mg (450 mg Oral Refused 11/20/15 2134)  carvedilol (COREG) tablet 6.25 mg (not administered)  furosemide (LASIX) tablet 20 mg (20 mg Oral Refused 11/20/15 2135)  gabapentin (NEURONTIN) capsule 300 mg (300 mg Oral Given 11/20/15 2143)  hydrALAZINE (APRESOLINE) tablet 25 mg (25 mg Oral Given 11/20/15 2143)  irbesartan (AVAPRO) tablet 300 mg (not administered)     Initial Impression / Assessment and Plan / ED Course  I have reviewed the triage vital signs and the nursing notes.  Pertinent labs & imaging results that were available during my care of the patient were reviewed by me and considered in my medical decision making (see chart for details).  Clinical Course as of Nov 19 2301  Mon Nov 20, 2015  1644 Fatigue is probably depression related. Will get TSH as well. If labs appear stable, will consult TTS  [SG]  1708 Labs benign (TSH pending). Will consult TTS  [SG]    Clinical Course User Index [SG] Sherwood Gambler, MD    Patient to be evaluated by TTS, disposition per them. Appears to be medically stable for psychiatric treatment.  Final Clinical Impressions(s) / ED Diagnoses   Final  diagnoses:  Suicidal ideation  Depression, unspecified depression type    New Prescriptions New Prescriptions   No medications on file     Sherwood Gambler, MD  11/20/15 2304  

## 2015-11-20 NOTE — ED Provider Notes (Addendum)
Patient has been cleared by behavioral health for suicidal ideation. I have interviewed the patient and she is alert and communicative. She is comfortable with her plan. She denies she has any suicidal plans. She has her resources and is planning to call in the morning to schedule outpatient therapy. Her daughters also present who is very helpful and supportive. She is counseled on the need to return should she have any thoughts of hurting herself or feel that things are changing.   Charlesetta Shanks, MD 11/20/15 1901  Prior to discharge, the patient's daughter expressed concerns of her mother being home. Plan now will be for the patient to spend the night in the emergency department to be seen by psychiatry in the morning.   Charlesetta Shanks, MD 11/20/15 [phone]

## 2015-11-20 NOTE — BH Assessment (Signed)
Assessment Note  Susan [name] is an 75 y.o. female that presents to [facility] voluntarily. Patient went to physical therapy today and expressed that she felt depressed and had suicidal thoughts. Patient admits that she has felt depressed most of her life. Sts that her depression symptoms worsened in January after a bad fall from line dancing. Patient also experienced a stroke around that time as well. Patient sad due to loosing her independence. She is no longer able to drive and depends on her daughter or M[employer]/M[employer] transportation to get around. Patient however appears to be of great sound mind. She is oriented to time, person, place, and situation. She is calm and cooperative. She denies prior suicide attempts. Sts, "I just want to get my life back on track and get better".  She sts that although she had thoughts of suicide she has no intent or plan. No prior history of suicide attempts or gestures. No self mutilating behaviors. Patient describes her current depressive symptoms has hopelessness, isolating self from others, fatigue, and loss of interest in usual pleasures. Appetite is fair. No significant weight loss or gain. No changes in sleep (6 hours per day). No HI. No AVH's. No HI. No history of aggressive or assaultive behaviors. No alcohol and drug use. No history of Inpatient mental health hospitalizations. No current psychiatrist/therapist.   Diagnosis: Major Depressive Disorder, Severe, no psychotic features  Past Medical History:  Past Medical History:  Diagnosis Date  . Anxiety   . Arthritis    "legs, back" (07/29/2012)  . Chronic diastolic (congestive) heart failure   . Chronic diastolic (congestive) heart failure   . Chronic lower back pain   . Complication of anesthesia    "I have apnea" (07/29/2012)  . Dementia    per family  . Depression   . Family history of anesthesia complication    "some wake up during OR; some are hard to wake up; some both" (07/29/2012)  . GERD (gastroesophageal  reflux disease)   . History of stomach ulcers 1980's  . Hyperlipidemia   . Hypertension   . Incontinent of urine    wears pads  . OSA on CPAP   . Osteoarthritis of right knee   . PAF (paroxysmal atrial fibrillation) (HCC)    CHADS2VASC score is 7 - on chronic anticoagulation with Apixaban  . Pedal edema   . Primary hyperparathyroidism ([place])   . Spinal stenosis   . Stroke ([place])   . Thyroid disease   . Umbilical hernia    "unrepaired" (07/29/2012)  . Varicose veins    "BLE" (07/29/2012)    Past Surgical History:  Procedure Laterality Date  . CHOLECYSTECTOMY  ~ 2010  . COLONOSCOPY    . IUD REMOVAL  1980's  . PARATHYROIDECTOMY N/A 06/16/2012   Procedure: NECK EXPLORATION AND LEFT SUPERIOR PARATHYROIDECTOMY;  Surgeon: Earnstine Regal, MD;  Location: [facility];  Service: General;  Laterality: N/A;  . 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 TOTAL KNEE ARTHROPLASTY Right 07/29/2012   Procedure: TOTAL KNEE ARTHROPLASTY;  Surgeon: Ninetta Lights, MD;  Location: [facility];  Service: Orthopedics;  Laterality: Right;    Family History:  Family History  Problem Relation Age of Onset  . Hypertension Mother     deceased  . Stroke Mother   . Lung cancer Father     deceased    Social History:  reports that she quit smoking about 37 years ago. Her smoking use included Cigarettes. She has a 30.00 pack-year smoking  history. She has never used smokeless tobacco. She reports that she does not drink alcohol or use drugs.  Additional Social History:  Alcohol / Drug Use Pain Medications: SEE MAR Prescriptions: SEE MAR Over the Counter: SEE MAR History of alcohol / drug use?: No history of alcohol / drug abuse  CIWA: CIWA-Ar BP: 145/74 Pulse Rate: 66 COWS:    Allergies:  Allergies  Allergen Reactions  . Lipitor [Atorvastatin] Swelling  . Simvastatin Other (See Comments)    Myalgias     . Ace Inhibitors Cough    Home Medications:  (Not in a hospital admission)  OB/GYN Status:  No LMP  recorded. Patient is postmenopausal.  General Assessment Data Location of Assessment: [facility] TTS Assessment: In system Is this a Tele or Face-to-Face Assessment?: Face-to-Face Is this an Initial Assessment or a Re-assessment for this encounter?: Initial Assessment Marital status: Single Maiden name:  (n/a) Is patient pregnant?: No Pregnancy Status: No Living Arrangements: Alone, Other (Comment) (lives in a retirement coummunity ) Can pt return to current living arrangement?: Yes Admission Status: Voluntary Is patient capable of signing voluntary admission?: Yes Referral Source: Self/Family/Friend Insurance type:  [facility])     Crisis Care Plan Living Arrangements: Alone, Other (Comment) (lives in a retirement coummunity ) Scientist, research (physical sciences) Guardian: Other: (no legal guardian ) Name of Psychiatrist:  (no psychiatrist ) Name of Therapist:  (no therapist )  Education Status Is patient currently in school?: No Current Grade:  (n/a) Highest grade of school patient has completed:  (n/a) Name of school:  (n/a) Contact person:  (n/a)  Risk to self with the past 6 months Suicidal Ideation: Yes-Currently Present Has patient been a risk to self within the past 6 months prior to admission? : Yes Suicidal Intent: No Has patient had any suicidal intent within the past 6 months prior to admission? : No Is patient at risk for suicide?: No Suicidal Plan?: No Has patient had any suicidal plan within the past 6 months prior to admission? : No Access to Means: No What has been your use of drugs/alcohol within the last 12 months?:  (denies ) Previous Attempts/Gestures: No How many times?:  (0) Other Self Harm Risks:  (denies ) Triggers for Past Attempts: Other (Comment) (no previous attempts and/or gestures ) Intentional Self Injurious Behavior: None Family Suicide History: No Recent stressful life event(s): Other (Comment) (stroke in january, unable to drive; limited transport, etc. ) Persecutory  voices/beliefs?: No Depression: Yes Depression Symptoms: Feeling worthless/self pity, Loss of interest in usual pleasures, Guilt, Fatigue, Isolating, Tearfulness, Despondent, Insomnia Substance abuse history and/or treatment for substance abuse?: No Suicide prevention information given to non-admitted patients: Not applicable  Risk to Others within the past 6 months Homicidal Ideation: No Does patient have any lifetime risk of violence toward others beyond the six months prior to admission? : No Thoughts of Harm to Others: No Current Homicidal Intent: No Current Homicidal Plan: No Access to Homicidal Means: No Identified Victim:  (n/a) History of harm to others?: No Assessment of Violence: None Noted Violent Behavior Description:  (patient calm and cooperative ) Does patient have access to weapons?: No Criminal Charges Pending?: No Does patient have a court date: No Is patient on probation?: No  Psychosis Hallucinations: None noted Delusions: None noted  Mental Status Report Appearance/Hygiene: In scrubs Eye Contact: Good Motor Activity: Freedom of movement Speech: Logical/coherent Level of Consciousness: Alert Mood: Depressed Affect: Anxious Anxiety Level: Minimal Thought Processes: Relevant, Coherent Judgement: Unimpaired Orientation: Person, Place, Time,  Situation Obsessive Compulsive Thoughts/Behaviors: None  Cognitive Functioning Concentration: Decreased Memory: Recent Intact, Remote Intact IQ: Average Insight: Good Impulse Control: Good Appetite: Fair Weight Loss:  (none reported) Weight Gain:  (none reported) Sleep: Decreased Total Hours of Sleep:  (6 hrs of sleep per night ) Vegetative Symptoms: Decreased grooming  ADLScreening [facility] Assessment Services) Patient's cognitive ability adequate to safely complete daily activities?: Yes Patient able to express need for assistance with ADLs?: Yes Independently performs ADLs?: Yes (appropriate for developmental  age)  Prior Inpatient Therapy Prior Inpatient Therapy: No Prior Therapy Dates:  (n/a) Prior Therapy Facilty/Provider(s):  (n/a) Reason for Treatment: n/a  Prior Outpatient Therapy Prior Outpatient Therapy: No Prior Therapy Dates:  (n/a) Prior Therapy Facilty/Provider(s):  (n/a) Reason for Treatment:  (n/a) Does patient have an ACCT team?: No Does patient have Intensive In-House Services?  : No Does patient have [facility] services? : No Does patient have P4CC services?: No  ADL Screening (condition at time of admission) Patient's cognitive ability adequate to safely complete daily activities?: Yes Is the patient deaf or have difficulty hearing?: No Does the patient have difficulty seeing, even when wearing glasses/contacts?: No (Patient wears glassess) Does the patient have difficulty concentrating, remembering, or making decisions?: No Patient able to express need for assistance with ADLs?: Yes Does the patient have difficulty dressing or bathing?: No Independently performs ADLs?: Yes (appropriate for developmental age) Does the patient have difficulty walking or climbing stairs?: Yes Weakness of Legs: Both Weakness of Arms/Hands: None  Home Assistive Devices/Equipment Home Assistive Devices/Equipment: Environmental consultant (specify type)    Abuse/Neglect Assessment (Assessment to be complete while patient is alone) Physical Abuse: Denies Verbal Abuse: Denies Sexual Abuse: Denies Exploitation of patient/patient's resources: Denies Self-Neglect: Denies Values / Beliefs Cultural Requests During Hospitalization: None Spiritual Requests During Hospitalization: None   Advance Directives (For Healthcare) Does patient have an advance directive?: No Would patient like information on creating an advanced directive?: No - patient declined information Nutrition Screen- [facility] Patient's home diet: Regular        Disposition: Waylan Boga, DNP, recommends discharge home with  outpatient referrals. Patient's family (daughtes) share concern for patient's discharge. State that patient is a danger to self. Patient to remain in the ED overnight. Pending am psych evaluation.  Disposition Initial Assessment Completed for this Encounter: Yes Disposition of Patient: Other dispositions (Per Waylan Boga, DNP, does not meet criteria for INPT) Other disposition(s): Other (Comment) (Referred to Psych IOP and/or individual therapist/psychiatri)  On Site Evaluation by:   Reviewed with Physician:  Waylan Boga, DNP  Waldon Merl 11/20/2015 7:22 PM

## 2015-11-20 NOTE — ED Triage Notes (Signed)
Patient from home c/o suicidal thoughts.  She reports it has been coming on for quite some time and her physical therapist suggested that she might be depressed.  Family brought patient in.  Patient is eating well, sleeping well, but reports she is sad and has loss interest in doing things she enjoys.  Alert and oriented patient tearful at times during triage.  No specific plan for suicide just thinking about it.

## 2015-11-21 ENCOUNTER — Emergency Department (HOSPITAL_COMMUNITY): Payer: PPO

## 2015-11-21 DIAGNOSIS — Z801 Family history of malignant neoplasm of trachea, bronchus and lung: Secondary | ICD-10-CM

## 2015-11-21 DIAGNOSIS — Z8249 Family history of ischemic heart disease and other diseases of the circulatory system: Secondary | ICD-10-CM | POA: Diagnosis not present

## 2015-11-21 DIAGNOSIS — Z823 Family history of stroke: Secondary | ICD-10-CM

## 2015-11-21 DIAGNOSIS — Z79899 Other long term (current) drug therapy: Secondary | ICD-10-CM

## 2015-11-21 DIAGNOSIS — R45851 Suicidal ideations: Secondary | ICD-10-CM

## 2015-11-21 DIAGNOSIS — Z9889 Other specified postprocedural states: Secondary | ICD-10-CM

## 2015-11-21 DIAGNOSIS — Z888 Allergy status to other drugs, medicaments and biological substances status: Secondary | ICD-10-CM

## 2015-11-21 DIAGNOSIS — F332 Major depressive disorder, recurrent severe without psychotic features: Secondary | ICD-10-CM | POA: Diagnosis not present

## 2015-11-21 DIAGNOSIS — R05 Cough: Secondary | ICD-10-CM | POA: Diagnosis not present

## 2015-11-21 DIAGNOSIS — Z87891 Personal history of nicotine dependence: Secondary | ICD-10-CM

## 2015-11-21 HISTORY — DX: Major depressive disorder, recurrent severe without psychotic features: F33.2

## 2015-11-21 LAB — URINALYSIS, ROUTINE W REFLEX MICROSCOPIC
BILIRUBIN URINE: NEGATIVE
GLUCOSE, UA: NEGATIVE mg/dL
Hgb urine dipstick: NEGATIVE
KETONES UR: NEGATIVE mg/dL
NITRITE: NEGATIVE
PH: 6 (ref 5.0–8.0)
PROTEIN: NEGATIVE mg/dL
Specific Gravity, Urine: 1.014 (ref 1.005–1.030)

## 2015-11-21 LAB — URINE MICROSCOPIC-ADD ON: RBC / HPF: NONE SEEN RBC/hpf (ref 0–5)

## 2015-11-21 NOTE — Consult Note (Signed)
[facility] Consult   Reason for Consult: depression and suicide Referring Physician:  EDP Patient Identification: Susan [name] MRN:  [id number] Principal Diagnosis: Major depressive disorder, recurrent episode, severe ([place]) Diagnosis:   Patient Active Problem List   Diagnosis Date Noted  . Major depressive disorder, recurrent episode, severe ([place]) [F33.2] 11/21/2015    Priority: High  . Edema [R60.9] 02/09/2015  . History of CVA (cerebrovascular accident) [Z86.73] 02/01/2015  . 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 ([place]) [I69.359]   . 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 Acute ischemic stroke (Left internal capsule) [I63.9] 01/28/2015  . Chronic low back pain [M54.5, G89.29] 01/28/2015  . Short-term memory loss [R41.3] 01/28/2015  . Volume depletion [E86.9] 01/28/2015  . Paroxysmal atrial fibrillation ([place]) [I48.0] 01/26/2015  . DCM (dilated cardiomyopathy) ([place]) [I42.0] 01/26/2015  . TIA (transient ischemic attack) [G45.9] 01/24/2015  . Chronic diastolic (congestive) heart failure [I50.32]   . Obesity (BMI 30-39.9) [E66.9] 01/27/2013  . Constipation [K59.00] 09/02/2012  . H/O arthroscopy of right knee [Z98.890] 08/06/2012  . Anxiety [F41.9]   . Depression [F32.9]   . GERD (gastroesophageal reflux disease) [K21.9]   . Hypertensive heart disease [I11.9]   . OSA (obstructive sleep apnea) [G47.33]   . Osteoarthritis of right knee [M17.11]   . Hyperparathyroidism, primary ([place]) [E21.0] 04/22/2012  . Chronic cough [R05] 10/02/2011    Total Time spent with patient: 45 minutes  Subjective:   Susan [name] is a 75 y.o. female patient admitted with suicidal thoughts.  HPI:  Susan [name] is a 74 y.o. female with history of MDD who was brought to [facility] for evaluation after she expressed depression and suicidal thoughts to her physical therapist. Patient reports being depressed all her life but her symptoms got  worsened in January after a bad fall from line dancing. She reports being depressed due to not being able to drive and depending on others to get around. Patient denies delusions, psychosis, drugs and alcohol abuse. Her family is worried about her current state of mind.  Past Psychiatric History: as above  Risk to Self: Suicidal Ideation: Yes-Currently Present Suicidal Intent: No Is patient at risk for suicide?: No Suicidal Plan?: No Access to Means: No What has been your use of drugs/alcohol within the last 12 months?:  (denies ) How many times?:  (0) Other Self Harm Risks:  (denies ) Triggers for Past Attempts: Other (Comment) (no previous attempts and/or gestures ) Intentional Self Injurious Behavior: None Risk to Others: Homicidal Ideation: No Thoughts of Harm to Others: No Current Homicidal Intent: No Current Homicidal Plan: No Access to Homicidal Means: No Identified Victim:  (n/a) History of harm to others?: No Assessment of Violence: None Noted Violent Behavior Description:  (patient calm and cooperative ) Does patient have access to weapons?: No Criminal Charges Pending?: No Does patient have a court date: No Prior Inpatient Therapy: Prior Inpatient Therapy: No Prior Therapy Dates:  (n/a) Prior Therapy Facilty/Provider(s):  (n/a) Reason for Treatment: n/a Prior Outpatient Therapy: Prior Outpatient Therapy: No Prior Therapy Dates:  (n/a) Prior Therapy Facilty/Provider(s):  (n/a) Reason for Treatment:  (n/a) Does patient have an ACCT team?: No Does patient have Intensive In-House Services?  : No Does patient have [facility] services? : No Does patient have P4CC services?: No  Past Medical History:  Past Medical History:  Diagnosis Date  . Anxiety   . Arthritis    "legs, back" (07/29/2012)  . Chronic diastolic (  congestive) heart failure   . Chronic diastolic (congestive) heart failure   . Chronic lower back pain   . Complication of anesthesia    "I have apnea"  (07/29/2012)  . Dementia    per family  . Depression   . Family history of anesthesia complication    "some wake up during OR; some are hard to wake up; some both" (07/29/2012)  . GERD (gastroesophageal reflux disease)   . History of stomach ulcers 1980's  . Hyperlipidemia   . Hypertension   . Incontinent of urine    wears pads  . OSA on CPAP   . Osteoarthritis of right knee   . PAF (paroxysmal atrial fibrillation) (HCC)    CHADS2VASC score is 7 - on chronic anticoagulation with Apixaban  . Pedal edema   . Primary hyperparathyroidism ([place])   . Spinal stenosis   . Stroke ([place])   . Thyroid disease   . Umbilical hernia    "unrepaired" (07/29/2012)  . Varicose veins    "BLE" (07/29/2012)    Past Surgical History:  Procedure Laterality Date  . CHOLECYSTECTOMY  ~ 2010  . COLONOSCOPY    . IUD REMOVAL  1980's  . PARATHYROIDECTOMY N/A 06/16/2012   Procedure: NECK EXPLORATION AND LEFT SUPERIOR PARATHYROIDECTOMY;  Surgeon: Earnstine Regal, MD;  Location: [facility];  Service: General;  Laterality: N/A;  . 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 TOTAL KNEE ARTHROPLASTY Right 07/29/2012   Procedure: TOTAL KNEE ARTHROPLASTY;  Surgeon: Ninetta Lights, MD;  Location: [facility];  Service: Orthopedics;  Laterality: Right;   Family History:  Family History  Problem Relation Age of Onset  . Hypertension Mother     deceased  . Stroke Mother   . Lung cancer Father     deceased   Family Psychiatric  History:  Social History:  History  Alcohol Use No     History  Drug Use No    Social History   Social History  . Marital status: Widowed    Spouse name: N/A  . Number of children: N/A  . Years of education: N/A   Occupational History  . retired Retired   Social History Main Topics  . Smoking status: Former Smoker    Packs/day: 1.00    Years: 30.00    Types: Cigarettes    Quit date: 01/07/1978  . Smokeless tobacco: Never Used  . Alcohol use No  . Drug use: No  . Sexual activity: No    Other Topics Concern  . None   Social History Narrative  . None   Additional Social History:    Allergies:   Allergies  Allergen Reactions  . Lipitor [Atorvastatin] Swelling  . Simvastatin Other (See Comments)    Myalgias     . Ace Inhibitors Cough    Labs:  Results for orders placed or performed during the hospital encounter of 11/20/15 (from the past 48 hour(s))  Comprehensive metabolic panel     Status: Abnormal   Collection Time: 11/20/15  3:52 PM  Result Value Ref Range   Sodium 142 135 - 145 mmol/L   Potassium 3.7 3.5 - 5.1 mmol/L   Chloride 107 101 - 111 mmol/L   CO2 29 22 - 32 mmol/L   Glucose, Bld 82 65 - 99 mg/dL   BUN 21 (H) 6 - 20 mg/dL   Creatinine, Ser 1.28 (H) 0.44 - 1.00 mg/dL   Calcium 10.3 8.9 - 10.3 mg/dL   Total Protein 7.5 6.5 -  8.1 g/dL   Albumin 4.3 3.5 - 5.0 g/dL   AST 26 15 - 41 U/L   ALT 23 14 - 54 U/L   Alkaline Phosphatase 72 38 - 126 U/L   Total Bilirubin 1.0 0.3 - 1.2 mg/dL   GFR calc non Af Amer 39 (L) >60 mL/min   GFR calc Af Amer 46 (L) >60 mL/min    Comment: (NOTE) The eGFR has been calculated using the CKD EPI equation. This calculation has not been validated in all clinical situations. eGFR's persistently <60 mL/min signify possible Chronic Kidney Disease.    Anion gap 6 5 - 15  Ethanol     Status: None   Collection Time: 11/20/15  3:52 PM  Result Value Ref Range   Alcohol, Ethyl (B) <5 <5 mg/dL    Comment:        LOWEST DETECTABLE LIMIT FOR SERUM ALCOHOL IS 5 mg/dL FOR MEDICAL PURPOSES ONLY   Salicylate level     Status: None   Collection Time: 11/20/15  3:52 PM  Result Value Ref Range   Salicylate Lvl <2.5 2.8 - 30.0 mg/dL  Acetaminophen level     Status: Abnormal   Collection Time: 11/20/15  3:52 PM  Result Value Ref Range   Acetaminophen (Tylenol), Serum <10 (L) 10 - 30 ug/mL    Comment:        THERAPEUTIC CONCENTRATIONS VARY SIGNIFICANTLY. A RANGE OF 10-30 ug/mL MAY BE AN EFFECTIVE CONCENTRATION FOR MANY  PATIENTS. HOWEVER, SOME ARE BEST TREATED AT CONCENTRATIONS OUTSIDE THIS RANGE. ACETAMINOPHEN CONCENTRATIONS >150 ug/mL AT 4 HOURS AFTER INGESTION AND >50 ug/mL AT 12 HOURS AFTER INGESTION ARE OFTEN ASSOCIATED WITH TOXIC REACTIONS.   cbc     Status: Abnormal   Collection Time: 11/20/15  3:52 PM  Result Value Ref Range   WBC 3.5 (L) 4.0 - 10.5 K/uL   RBC 3.96 3.87 - 5.11 MIL/uL   Hemoglobin 12.4 12.0 - 15.0 g/dL   HCT 36.9 36.0 - 46.0 %   MCV 93.2 78.0 - 100.0 fL   MCH 31.3 26.0 - 34.0 pg   MCHC 33.6 30.0 - 36.0 g/dL   RDW 13.0 11.5 - 15.5 %   Platelets 242 150 - 400 K/uL  TSH     Status: None   Collection Time: 11/20/15  4:10 PM  Result Value Ref Range   TSH 1.293 0.350 - 4.500 uIU/mL    Comment: Performed by a 3rd Generation assay with a functional sensitivity of <=0.01 uIU/mL.  Rapid urine drug screen (hospital performed)     Status: None   Collection Time: 11/20/15  4:13 PM  Result Value Ref Range   Opiates NONE DETECTED NONE DETECTED   Cocaine NONE DETECTED NONE DETECTED   Benzodiazepines NONE DETECTED NONE DETECTED   Amphetamines NONE DETECTED NONE DETECTED   Tetrahydrocannabinol NONE DETECTED NONE DETECTED   Barbiturates NONE DETECTED NONE DETECTED    Comment:        DRUG SCREEN FOR MEDICAL PURPOSES ONLY.  IF CONFIRMATION IS NEEDED FOR ANY PURPOSE, NOTIFY LAB WITHIN 5 DAYS.        LOWEST DETECTABLE LIMITS FOR URINE DRUG SCREEN Drug Class       Cutoff (ng/mL) Amphetamine      1000 Barbiturate      200 Benzodiazepine   003 Tricyclics       704 Opiates          300 Cocaine          300 THC  50     Current Facility-Administered Medications  Medication Dose Route Frequency Provider Last Rate Last Dose  . acetaminophen (TYLENOL) tablet 500 mg  500 mg Oral Q6H PRN Charlesetta Shanks, MD      . apixaban (ELIQUIS) tablet 5 mg  5 mg Oral Q12H Charlesetta Shanks, MD   5 mg at 11/21/15 1014  . buPROPion (WELLBUTRIN XL) 24 hr tablet 450 mg  450 mg Oral Daily  Charlesetta Shanks, MD   450 mg at 11/21/15 1014  . carvedilol (COREG) tablet 6.25 mg  6.25 mg Oral BID WC Charlesetta Shanks, MD   6.25 mg at 11/21/15 0811  . furosemide (LASIX) tablet 20 mg  20 mg Oral BID Charlesetta Shanks, MD   20 mg at 11/21/15 0811  . gabapentin (NEURONTIN) capsule 300 mg  300 mg Oral BID Charlesetta Shanks, MD   300 mg at 11/21/15 1014  . hydrALAZINE (APRESOLINE) tablet 25 mg  25 mg Oral TID Charlesetta Shanks, MD   25 mg at 11/21/15 1014  . HYDROcodone-acetaminophen (NORCO/VICODIN) 5-325 MG per tablet 1 tablet  1 tablet Oral Q6H PRN Charlesetta Shanks, MD      . irbesartan (AVAPRO) tablet 300 mg  300 mg Oral Daily Charlesetta Shanks, MD   300 mg at 11/21/15 1014   Current Outpatient Prescriptions  Medication Sig Dispense Refill  . acetaminophen (TYLENOL) 500 MG tablet Take 500 mg by mouth every 6 (six) hours as needed for moderate pain.    Marland Kitchen apixaban (ELIQUIS) 5 MG TABS tablet Take 1 tablet (5 mg total) by mouth every 12 (twelve) hours. 60 tablet 4  . buPROPion (WELLBUTRIN XL) 150 MG 24 hr tablet Take 450 mg by mouth daily.    . carvedilol (COREG) 6.25 MG tablet Take 1 tablet (6.25 mg total) by mouth 2 (two) times daily with a meal. 60 tablet 11  . cholecalciferol (VITAMIN D) 1000 units tablet Take 1,000 Units by mouth daily.    . furosemide (LASIX) 20 MG tablet Take 20 mg by mouth 2 (two) times daily.     . furosemide (LASIX) 40 MG tablet Take 1 tablet (40 mg total) by mouth daily. 90 tablet 3  . gabapentin (NEURONTIN) 300 MG capsule Take 300 mg by mouth 2 (two) times daily.     . hydrALAZINE (APRESOLINE) 25 MG tablet Take 1 tablet (25 mg total) by mouth 3 (three) times daily. 270 tablet 3  . HYDROcodone-acetaminophen (NORCO/VICODIN) 5-325 MG tablet Take 1 tablet by mouth every 6 (six) hours as needed for severe pain. 5 tablet 0  . Multiple Vitamin (MULTIVITAMIN WITH MINERALS) TABS Take 1 tablet by mouth daily.    . pravastatin (PRAVACHOL) 40 MG tablet Take 40 mg by mouth daily.     . valsartan  (DIOVAN) 320 MG tablet TAKE 1 TABLET BY MOUTH EVERY DAY 30 tablet 11  . vitamin B-12 (CYANOCOBALAMIN) 100 MCG tablet Take 100 mcg by mouth daily.    Marland Kitchen ezetimibe (ZETIA) 10 MG tablet Take 1 tablet (10 mg total) by mouth daily. (Patient not taking: Reported on 11/20/2015) 30 tablet 3  . HYDROcodone-acetaminophen (NORCO/VICODIN) 5-325 MG tablet Take 1 tablet by mouth every 6 (six) hours as needed for moderate pain.    . pravastatin (PRAVACHOL) 20 MG tablet Take 2 tablets (40 mg total) by mouth every morning. (Patient not taking: Reported on 11/20/2015) 60 tablet 3    Musculoskeletal: Strength & Muscle Tone: within normal limits Gait & Station: unsteady Patient leans: N/A  Psychiatric Specialty Exam:  Physical Exam  Psychiatric: Her speech is normal. Judgment normal. Her affect is blunt. She is slowed and withdrawn. Cognition and memory are normal. She exhibits a depressed mood. She expresses suicidal ideation.    Review of Systems  Constitutional: Negative.   HENT: Negative.   Cardiovascular: Negative.   Gastrointestinal: Negative.   Genitourinary: Negative.   Musculoskeletal: Negative.   Skin: Negative.   Endo/Heme/Allergies: Negative.   Psychiatric/Behavioral: Positive for depression and suicidal ideas. The patient has insomnia.     Blood pressure 158/74, pulse (!) 55, temperature 97.9 F (36.6 C), temperature source Oral, resp. rate 16, SpO2 97 %.There is no height or weight on file to calculate BMI.  General Appearance: Casual  Eye Contact:  Good  Speech:  Clear and Coherent  Volume:  Normal  Mood:  Depressed and Hopeless  Affect:  Appropriate and Constricted  Thought Process:  Coherent and Descriptions of Associations: Intact  Orientation:  Full (Time, Place, and Person)  Thought Content:  Logical  Suicidal Thoughts:  Yes.  without intent/plan  Homicidal Thoughts:  No  Memory:  Immediate;   Good Recent;   Good Remote;   Good  Judgement:  Fair  Insight:  Fair  Psychomotor  Activity:  Decreased and Psychomotor Retardation  Concentration:  Concentration: Fair and Attention Span: Fair  Recall:  [employer] of Knowledge:  Fair  Language:  Good  Akathisia:  No  Handed:  Right  AIMS (if indicated):     Assets:  Communication Skills Desire for Improvement Social Support  ADL's:  Intact  Cognition:  WNL  Sleep:   fair     Treatment Plan Summary: Daily contact with patient to assess and evaluate symptoms and progress in treatment and Medication management  Disposition: Recommend psychiatric Inpatient admission when medically cleared. Supportive therapy provided about ongoing stressors. Patient will benefit from geriatric inpatient admission for stabilization  Corena Pilgrim, MD 11/21/2015 11:12 AM

## 2015-11-21 NOTE — ED Notes (Signed)
Pt assisted with sitter to take shower.

## 2015-11-21 NOTE — ED Notes (Signed)
Pt stated "I just don't want to be here anymore.  I don't have a plan.  I have 3 children, 6 grandchildren & 5 great grands.  They're they reason I stay around.  I play Bingo and video games but I don't think it helps with the depression.  I think it's the medicine but I don't have a psychiatrist."

## 2015-11-21 NOTE — ED Notes (Signed)
Pt verbalizes depression with pass thoughts of hurts self; denies at present time.

## 2015-11-21 NOTE — BH Assessment (Signed)
[facility] Assessment Progress Note  Per Corena Pilgrim, MD, this pt requires psychiatric hospitalization at this time.  The following facilities have been contacted to seek placement for this pt, with results as noted:  Beds available, information sent, decision pending:  [place]:  [facility] (due to medical acuity)   At capacity:  [facility] [place], [place] Triage Specialist [phone]

## 2015-11-21 NOTE — ED Notes (Signed)
Pt stated she wanted to wait and take her lasix since she just took "so much" blood pressure medication.

## 2015-11-22 DIAGNOSIS — K219 Gastro-esophageal reflux disease without esophagitis: Secondary | ICD-10-CM | POA: Diagnosis not present

## 2015-11-22 DIAGNOSIS — Z888 Allergy status to other drugs, medicaments and biological substances status: Secondary | ICD-10-CM | POA: Diagnosis not present

## 2015-11-22 DIAGNOSIS — Z9889 Other specified postprocedural states: Secondary | ICD-10-CM | POA: Diagnosis not present

## 2015-11-22 DIAGNOSIS — Z823 Family history of stroke: Secondary | ICD-10-CM | POA: Diagnosis not present

## 2015-11-22 DIAGNOSIS — Z801 Family history of malignant neoplasm of trachea, bronchus and lung: Secondary | ICD-10-CM | POA: Diagnosis not present

## 2015-11-22 DIAGNOSIS — F332 Major depressive disorder, recurrent severe without psychotic features: Secondary | ICD-10-CM | POA: Diagnosis not present

## 2015-11-22 DIAGNOSIS — Z8249 Family history of ischemic heart disease and other diseases of the circulatory system: Secondary | ICD-10-CM | POA: Diagnosis not present

## 2015-11-22 DIAGNOSIS — I480 Paroxysmal atrial fibrillation: Secondary | ICD-10-CM | POA: Diagnosis not present

## 2015-11-22 DIAGNOSIS — F329 Major depressive disorder, single episode, unspecified: Secondary | ICD-10-CM | POA: Diagnosis not present

## 2015-11-22 DIAGNOSIS — Z87891 Personal history of nicotine dependence: Secondary | ICD-10-CM | POA: Diagnosis not present

## 2015-11-22 DIAGNOSIS — G4733 Obstructive sleep apnea (adult) (pediatric): Secondary | ICD-10-CM | POA: Diagnosis not present

## 2015-11-22 DIAGNOSIS — R45851 Suicidal ideations: Secondary | ICD-10-CM | POA: Diagnosis not present

## 2015-11-22 DIAGNOSIS — N183 Chronic kidney disease, stage 3 (moderate): Secondary | ICD-10-CM | POA: Diagnosis not present

## 2015-11-22 DIAGNOSIS — Z79899 Other long term (current) drug therapy: Secondary | ICD-10-CM | POA: Diagnosis not present

## 2015-11-22 DIAGNOSIS — I130 Hypertensive heart and chronic kidney disease with heart failure and stage 1 through stage 4 chronic kidney disease, or unspecified chronic kidney disease: Secondary | ICD-10-CM | POA: Diagnosis not present

## 2015-11-22 DIAGNOSIS — I5032 Chronic diastolic (congestive) heart failure: Secondary | ICD-10-CM | POA: Diagnosis not present

## 2015-11-22 NOTE — Consult Note (Signed)
[facility] Consult   Reason for Consult: depression and suicide Referring Physician:  EDP Patient Identification: Susan [name] MRN:  [id number] Principal Diagnosis: Major depressive disorder, recurrent episode, severe ([place]) Diagnosis:   Patient Active Problem List   Diagnosis Date Noted  . Major depressive disorder, recurrent episode, severe ([place]) [F33.2] 11/21/2015    Priority: High  . Edema [R60.9] 02/09/2015  . History of CVA (cerebrovascular accident) [Z86.73] 02/01/2015  . 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 ([place]) [I69.359]   . 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 Acute ischemic stroke (Left internal capsule) [I63.9] 01/28/2015  . Chronic low back pain [M54.5, G89.29] 01/28/2015  . Short-term memory loss [R41.3] 01/28/2015  . Volume depletion [E86.9] 01/28/2015  . Paroxysmal atrial fibrillation ([place]) [I48.0] 01/26/2015  . DCM (dilated cardiomyopathy) ([place]) [I42.0] 01/26/2015  . TIA (transient ischemic attack) [G45.9] 01/24/2015  . Chronic diastolic (congestive) heart failure [I50.32]   . Obesity (BMI 30-39.9) [E66.9] 01/27/2013  . Constipation [K59.00] 09/02/2012  . H/O arthroscopy of right knee [Z98.890] 08/06/2012  . Anxiety [F41.9]   . Depression [F32.9]   . GERD (gastroesophageal reflux disease) [K21.9]   . Hypertensive heart disease [I11.9]   . OSA (obstructive sleep apnea) [G47.33]   . Osteoarthritis of right knee [M17.11]   . Hyperparathyroidism, primary ([place]) [E21.0] 04/22/2012  . Chronic cough [R05] 10/02/2011    Total Time spent with patient:  30 minutes  Subjective:   Susan [name] is a 75 y.o. female patient admitted with suicidal thoughts.  HPI:  Susan [name] is a 75 y.o. female with history of MDD who was brought to [facility] for evaluation after she expressed depression and suicidal thoughts to her physical therapist. Patient reports being depressed all her life but her symptoms got  worsened in January after a bad fall from line dancing. She reports being depressed due to not being able to drive and depending on others to get around. Patient denies delusions, psychosis, drugs and alcohol abuse. Her family is worried about her current state of mind.  Patient continues to endorse suicidal ideations with feelings of hopelessness, worthlessness, and helplessness.  [facility] accepted her, transfer after obtaining her consents.  Past Psychiatric History: as above  Risk to Self: Suicidal Ideation: Yes-Currently Present Suicidal Intent: No Is patient at risk for suicide?: No Suicidal Plan?: No Access to Means: No What has been your use of drugs/alcohol within the last 12 months?:  (denies ) How many times?:  (0) Other Self Harm Risks:  (denies ) Triggers for Past Attempts: Other (Comment) (no previous attempts and/or gestures ) Intentional Self Injurious Behavior: None Risk to Others: Homicidal Ideation: No Thoughts of Harm to Others: No Current Homicidal Intent: No Current Homicidal Plan: No Access to Homicidal Means: No Identified Victim:  (n/a) History of harm to others?: No Assessment of Violence: None Noted Violent Behavior Description:  (patient calm and cooperative ) Does patient have access to weapons?: No Criminal Charges Pending?: No Does patient have a court date: No Prior Inpatient Therapy: Prior Inpatient Therapy: No Prior Therapy Dates:  (n/a) Prior Therapy Facilty/Provider(s):  (n/a) Reason for Treatment: n/a Prior Outpatient Therapy: Prior Outpatient Therapy: No Prior Therapy Dates:  (n/a) Prior Therapy Facilty/Provider(s):  (n/a) Reason for Treatment:  (n/a) Does patient have an ACCT team?: No Does patient have Intensive In-House Services?  : No Does patient have [facility] services? : No Does patient have P4CC services?: No  Past  Medical History:  Past Medical History:  Diagnosis Date  . Anxiety   . Arthritis    "legs, back"  (07/29/2012)  . Chronic diastolic (congestive) heart failure   . Chronic diastolic (congestive) heart failure   . Chronic lower back pain   . Complication of anesthesia    "I have apnea" (07/29/2012)  . Dementia    per family  . Depression   . Family history of anesthesia complication    "some wake up during OR; some are hard to wake up; some both" (07/29/2012)  . GERD (gastroesophageal reflux disease)   . History of stomach ulcers 1980's  . Hyperlipidemia   . Hypertension   . Incontinent of urine    wears pads  . OSA on CPAP   . Osteoarthritis of right knee   . PAF (paroxysmal atrial fibrillation) (HCC)    CHADS2VASC score is 7 - on chronic anticoagulation with Apixaban  . Pedal edema   . Primary hyperparathyroidism ([place])   . Spinal stenosis   . Stroke ([place])   . Thyroid disease   . Umbilical hernia    "unrepaired" (07/29/2012)  . Varicose veins    "BLE" (07/29/2012)    Past Surgical History:  Procedure Laterality Date  . CHOLECYSTECTOMY  ~ 2010  . COLONOSCOPY    . IUD REMOVAL  1980's  . PARATHYROIDECTOMY N/A 06/16/2012   Procedure: NECK EXPLORATION AND LEFT SUPERIOR PARATHYROIDECTOMY;  Surgeon: Earnstine Regal, MD;  Location: [facility];  Service: General;  Laterality: N/A;  . 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 TOTAL KNEE ARTHROPLASTY Right 07/29/2012   Procedure: TOTAL KNEE ARTHROPLASTY;  Surgeon: Ninetta Lights, MD;  Location: [facility];  Service: Orthopedics;  Laterality: Right;   Family History:  Family History  Problem Relation Age of Onset  . Hypertension Mother     deceased  . Stroke Mother   . Lung cancer Father     deceased   Family Psychiatric  History:  Social History:  History  Alcohol Use No     History  Drug Use No    Social History   Social History  . Marital status: Widowed    Spouse name: N/A  . Number of children: N/A  . Years of education: N/A   Occupational History  . retired Retired   Social History Main Topics  . Smoking status:  Former Smoker    Packs/day: 1.00    Years: 30.00    Types: Cigarettes    Quit date: 01/07/1978  . Smokeless tobacco: Never Used  . Alcohol use No  . Drug use: No  . Sexual activity: No   Other Topics Concern  . None   Social History Narrative  . None   Additional Social History:    Allergies:   Allergies  Allergen Reactions  . Lipitor [Atorvastatin] Swelling  . Simvastatin Other (See Comments)    Myalgias     . Ace Inhibitors Cough    Labs:  Results for orders placed or performed during the hospital encounter of 11/20/15 (from the past 48 hour(s))  Comprehensive metabolic panel     Status: Abnormal   Collection Time: 11/20/15  3:52 PM  Result Value Ref Range   Sodium 142 135 - 145 mmol/L   Potassium 3.7 3.5 - 5.1 mmol/L   Chloride 107 101 - 111 mmol/L   CO2 29 22 - 32 mmol/L   Glucose, Bld 82 65 - 99 mg/dL   BUN 21 (H) 6 - 20  mg/dL   Creatinine, Ser 1.28 (H) 0.44 - 1.00 mg/dL   Calcium 10.3 8.9 - 10.3 mg/dL   Total Protein 7.5 6.5 - 8.1 g/dL   Albumin 4.3 3.5 - 5.0 g/dL   AST 26 15 - 41 U/L   ALT 23 14 - 54 U/L   Alkaline Phosphatase 72 38 - 126 U/L   Total Bilirubin 1.0 0.3 - 1.2 mg/dL   GFR calc non Af Amer 39 (L) >60 mL/min   GFR calc Af Amer 46 (L) >60 mL/min    Comment: (NOTE) The eGFR has been calculated using the CKD EPI equation. This calculation has not been validated in all clinical situations. eGFR's persistently <60 mL/min signify possible Chronic Kidney Disease.    Anion gap 6 5 - 15  Ethanol     Status: None   Collection Time: 11/20/15  3:52 PM  Result Value Ref Range   Alcohol, Ethyl (B) <5 <5 mg/dL    Comment:        LOWEST DETECTABLE LIMIT FOR SERUM ALCOHOL IS 5 mg/dL FOR MEDICAL PURPOSES ONLY   Salicylate level     Status: None   Collection Time: 11/20/15  3:52 PM  Result Value Ref Range   Salicylate Lvl <0.3 2.8 - 30.0 mg/dL  Acetaminophen level     Status: Abnormal   Collection Time: 11/20/15  3:52 PM  Result Value Ref Range    Acetaminophen (Tylenol), Serum <10 (L) 10 - 30 ug/mL    Comment:        THERAPEUTIC CONCENTRATIONS VARY SIGNIFICANTLY. A RANGE OF 10-30 ug/mL MAY BE AN EFFECTIVE CONCENTRATION FOR MANY PATIENTS. HOWEVER, SOME ARE BEST TREATED AT CONCENTRATIONS OUTSIDE THIS RANGE. ACETAMINOPHEN CONCENTRATIONS >150 ug/mL AT 4 HOURS AFTER INGESTION AND >50 ug/mL AT 12 HOURS AFTER INGESTION ARE OFTEN ASSOCIATED WITH TOXIC REACTIONS.   cbc     Status: Abnormal   Collection Time: 11/20/15  3:52 PM  Result Value Ref Range   WBC 3.5 (L) 4.0 - 10.5 K/uL   RBC 3.96 3.87 - 5.11 MIL/uL   Hemoglobin 12.4 12.0 - 15.0 g/dL   HCT 36.9 36.0 - 46.0 %   MCV 93.2 78.0 - 100.0 fL   MCH 31.3 26.0 - 34.0 pg   MCHC 33.6 30.0 - 36.0 g/dL   RDW 13.0 11.5 - 15.5 %   Platelets 242 150 - 400 K/uL  TSH     Status: None   Collection Time: 11/20/15  4:10 PM  Result Value Ref Range   TSH 1.293 0.350 - 4.500 uIU/mL    Comment: Performed by a 3rd Generation assay with a functional sensitivity of <=0.01 uIU/mL.  Rapid urine drug screen (hospital performed)     Status: None   Collection Time: 11/20/15  4:13 PM  Result Value Ref Range   Opiates NONE DETECTED NONE DETECTED   Cocaine NONE DETECTED NONE DETECTED   Benzodiazepines NONE DETECTED NONE DETECTED   Amphetamines NONE DETECTED NONE DETECTED   Tetrahydrocannabinol NONE DETECTED NONE DETECTED   Barbiturates NONE DETECTED NONE DETECTED    Comment:        DRUG SCREEN FOR MEDICAL PURPOSES ONLY.  IF CONFIRMATION IS NEEDED FOR ANY PURPOSE, NOTIFY LAB WITHIN 5 DAYS.        LOWEST DETECTABLE LIMITS FOR URINE DRUG SCREEN Drug Class       Cutoff (ng/mL) Amphetamine      1000 Barbiturate      200 Benzodiazepine   500 Tricyclics  300 Opiates          300 Cocaine          300 THC              50   Urinalysis, Routine w reflex microscopic (not at [facility])     Status: Abnormal   Collection Time: 11/21/15 10:51 AM  Result Value Ref Range   Color, Urine YELLOW  YELLOW   APPearance CLEAR CLEAR   Specific Gravity, Urine 1.014 1.005 - 1.030   pH 6.0 5.0 - 8.0   Glucose, UA NEGATIVE NEGATIVE mg/dL   Hgb urine dipstick NEGATIVE NEGATIVE   Bilirubin Urine NEGATIVE NEGATIVE   Ketones, ur NEGATIVE NEGATIVE mg/dL   Protein, ur NEGATIVE NEGATIVE mg/dL   Nitrite NEGATIVE NEGATIVE   Leukocytes, UA TRACE (A) NEGATIVE  Urine microscopic-add on     Status: Abnormal   Collection Time: 11/21/15 10:51 AM  Result Value Ref Range   Squamous Epithelial / LPF 0-5 (A) NONE SEEN   WBC, UA 0-5 0 - 5 WBC/hpf   RBC / HPF NONE SEEN 0 - 5 RBC/hpf   Bacteria, UA RARE (A) NONE SEEN    Current Facility-Administered Medications  Medication Dose Route Frequency Provider Last Rate Last Dose  . acetaminophen (TYLENOL) tablet 500 mg  500 mg Oral Q6H PRN Charlesetta Shanks, MD      . apixaban (ELIQUIS) tablet 5 mg  5 mg Oral Q12H Charlesetta Shanks, MD   5 mg at 11/22/15 0931  . buPROPion (WELLBUTRIN XL) 24 hr tablet 450 mg  450 mg Oral Daily Charlesetta Shanks, MD   450 mg at 11/22/15 0930  . carvedilol (COREG) tablet 6.25 mg  6.25 mg Oral BID WC Charlesetta Shanks, MD   6.25 mg at 11/21/15 1647  . furosemide (LASIX) tablet 20 mg  20 mg Oral BID Charlesetta Shanks, MD   20 mg at 11/22/15 0757  . gabapentin (NEURONTIN) capsule 300 mg  300 mg Oral BID Charlesetta Shanks, MD   300 mg at 11/22/15 0930  . hydrALAZINE (APRESOLINE) tablet 25 mg  25 mg Oral TID Charlesetta Shanks, MD   25 mg at 11/22/15 0931  . HYDROcodone-acetaminophen (NORCO/VICODIN) 5-325 MG per tablet 1 tablet  1 tablet Oral Q6H PRN Charlesetta Shanks, MD      . irbesartan (AVAPRO) tablet 300 mg  300 mg Oral Daily Charlesetta Shanks, MD   300 mg at 11/22/15 8882   Current Outpatient Prescriptions  Medication Sig Dispense Refill  . acetaminophen (TYLENOL) 500 MG tablet Take 500 mg by mouth every 6 (six) hours as needed for moderate pain.    Marland Kitchen apixaban (ELIQUIS) 5 MG TABS tablet Take 1 tablet (5 mg total) by mouth every 12 (twelve) hours. 60 tablet  4  . buPROPion (WELLBUTRIN XL) 150 MG 24 hr tablet Take 450 mg by mouth daily.    . carvedilol (COREG) 6.25 MG tablet Take 1 tablet (6.25 mg total) by mouth 2 (two) times daily with a meal. 60 tablet 11  . cholecalciferol (VITAMIN D) 1000 units tablet Take 1,000 Units by mouth daily.    . furosemide (LASIX) 20 MG tablet Take 20 mg by mouth 2 (two) times daily.     . furosemide (LASIX) 40 MG tablet Take 1 tablet (40 mg total) by mouth daily. 90 tablet 3  . gabapentin (NEURONTIN) 300 MG capsule Take 300 mg by mouth 2 (two) times daily.     . hydrALAZINE (APRESOLINE) 25 MG tablet Take 1 tablet (  25 mg total) by mouth 3 (three) times daily. 270 tablet 3  . HYDROcodone-acetaminophen (NORCO/VICODIN) 5-325 MG tablet Take 1 tablet by mouth every 6 (six) hours as needed for severe pain. 5 tablet 0  . Multiple Vitamin (MULTIVITAMIN WITH MINERALS) TABS Take 1 tablet by mouth daily.    . pravastatin (PRAVACHOL) 40 MG tablet Take 40 mg by mouth daily.     . valsartan (DIOVAN) 320 MG tablet TAKE 1 TABLET BY MOUTH EVERY DAY 30 tablet 11  . vitamin B-12 (CYANOCOBALAMIN) 100 MCG tablet Take 100 mcg by mouth daily.    Marland Kitchen ezetimibe (ZETIA) 10 MG tablet Take 1 tablet (10 mg total) by mouth daily. (Patient not taking: Reported on 11/20/2015) 30 tablet 3  . HYDROcodone-acetaminophen (NORCO/VICODIN) 5-325 MG tablet Take 1 tablet by mouth every 6 (six) hours as needed for moderate pain.    . pravastatin (PRAVACHOL) 20 MG tablet Take 2 tablets (40 mg total) by mouth every morning. (Patient not taking: Reported on 11/20/2015) 60 tablet 3    Musculoskeletal: Strength & Muscle Tone: within normal limits Gait & Station: unsteady Patient leans: N/A  Psychiatric Specialty Exam: Physical Exam  Constitutional: She is oriented to person, place, and time. She appears well-developed and well-nourished.  HENT:  Head: Normocephalic.  Neck: Normal range of motion.  Respiratory: Effort normal.  Musculoskeletal: Normal range of  motion.  Neurological: She is alert and oriented to person, place, and time.  Psychiatric: Her speech is normal. Judgment normal. Her affect is blunt. She is slowed and withdrawn. Cognition and memory are normal. She exhibits a depressed mood. She expresses suicidal ideation.    Review of Systems  Constitutional: Negative.   HENT: Negative.   Cardiovascular: Negative.   Gastrointestinal: Negative.   Genitourinary: Negative.   Musculoskeletal: Negative.   Skin: Negative.   Neurological: Negative.   Endo/Heme/Allergies: Negative.   Psychiatric/Behavioral: Positive for depression and suicidal ideas. The patient has insomnia.     Blood pressure 163/68, pulse (!) 56, temperature 97.6 F (36.4 C), temperature source Oral, resp. rate 16, SpO2 100 %.There is no height or weight on file to calculate BMI.  General Appearance: Casual  Eye Contact:  Good  Speech:  Clear and Coherent  Volume:  Normal  Mood:  Depressed and Hopeless  Affect:  Appropriate and Constricted  Thought Process:  Coherent and Descriptions of Associations: Intact  Orientation:  Full (Time, Place, and Person)  Thought Content:  Logical  Suicidal Thoughts:  Yes.  without intent/plan  Homicidal Thoughts:  No  Memory:  Immediate;   Good Recent;   Good Remote;   Good  Judgement:  Fair  Insight:  Fair  Psychomotor Activity:  Decreased and Psychomotor Retardation  Concentration:  Concentration: Fair and Attention Span: Fair  Recall:  [employer] of Knowledge:  Fair  Language:  Good  Akathisia:  No  Handed:  Right  AIMS (if indicated):     Assets:  Communication Skills Desire for Improvement Social Support  ADL's:  Intact  Cognition:  WNL  Sleep:   fair     Treatment Plan Summary: Daily contact with patient to assess and evaluate symptoms and progress in treatment and Medication management:  Major depressive disorder, recurrent, severe without psychosis: -Crisis stabilization -Medication management:  Continue  medical medications along with Wellbutrin 450 mg daily for depression and Gabapentin 300 mg BID for mood and neuropathic pain -Individual counseling  Disposition: Recommend psychiatric Inpatient admission when medically cleared. Supportive therapy provided about ongoing  stressors. Patient will benefit from geriatric inpatient admission for stabilization  Waylan Boga, NP 11/22/2015 11:21 AM  Patient seen face-to-face for psychiatric evaluation, chart reviewed and case discussed with the physician extender and developed treatment plan. Reviewed the information documented and agree with the treatment plan. Corena Pilgrim, MD

## 2015-11-22 NOTE — BH Assessment (Signed)
Clinician spoke with [facility] Sidle) regarding pt placement. [facility] does not accept [employer] panel and pt will be required to pay out of pocket for treatment if placed at [employer] [facility].

## 2015-11-22 NOTE — Progress Notes (Addendum)
Patient signed the voluntary admission form for admission to [facility]. Form faxed to [phone].  Merry Proud, LCSWA Clinical Social Worker [phone] 12:22 PM

## 2015-11-22 NOTE — Progress Notes (Signed)
Patient accepted to [facility], please call nursing report to [phone]

## 2015-11-22 NOTE — ED Notes (Signed)
[facility] notified of transportation need

## 2015-11-22 NOTE — ED Notes (Signed)
[facility] at hospital to transport patient to [facility].

## 2015-11-23 DIAGNOSIS — I480 Paroxysmal atrial fibrillation: Secondary | ICD-10-CM | POA: Diagnosis not present

## 2015-11-23 DIAGNOSIS — F332 Major depressive disorder, recurrent severe without psychotic features: Secondary | ICD-10-CM | POA: Diagnosis not present

## 2015-11-23 DIAGNOSIS — I5032 Chronic diastolic (congestive) heart failure: Secondary | ICD-10-CM | POA: Diagnosis not present

## 2015-11-23 DIAGNOSIS — N183 Chronic kidney disease, stage 3 (moderate): Secondary | ICD-10-CM | POA: Diagnosis not present

## 2015-11-23 DIAGNOSIS — I10 Essential (primary) hypertension: Secondary | ICD-10-CM | POA: Diagnosis not present

## 2015-11-23 DIAGNOSIS — G4733 Obstructive sleep apnea (adult) (pediatric): Secondary | ICD-10-CM | POA: Diagnosis not present

## 2015-11-23 DIAGNOSIS — R05 Cough: Secondary | ICD-10-CM | POA: Diagnosis not present

## 2015-11-23 DIAGNOSIS — F339 Major depressive disorder, recurrent, unspecified: Secondary | ICD-10-CM | POA: Diagnosis not present

## 2015-11-24 DIAGNOSIS — F332 Major depressive disorder, recurrent severe without psychotic features: Secondary | ICD-10-CM | POA: Diagnosis not present

## 2015-11-25 DIAGNOSIS — F332 Major depressive disorder, recurrent severe without psychotic features: Secondary | ICD-10-CM | POA: Diagnosis not present

## 2015-11-26 DIAGNOSIS — F332 Major depressive disorder, recurrent severe without psychotic features: Secondary | ICD-10-CM | POA: Diagnosis not present

## 2015-11-27 DIAGNOSIS — F339 Major depressive disorder, recurrent, unspecified: Secondary | ICD-10-CM | POA: Diagnosis not present

## 2015-11-28 DIAGNOSIS — F329 Major depressive disorder, single episode, unspecified: Secondary | ICD-10-CM | POA: Diagnosis not present

## 2015-12-05 DIAGNOSIS — I5032 Chronic diastolic (congestive) heart failure: Secondary | ICD-10-CM | POA: Diagnosis not present

## 2015-12-05 DIAGNOSIS — I110 Hypertensive heart disease with heart failure: Secondary | ICD-10-CM | POA: Diagnosis not present

## 2015-12-05 DIAGNOSIS — Z9181 History of falling: Secondary | ICD-10-CM | POA: Diagnosis not present

## 2015-12-05 DIAGNOSIS — I69351 Hemiplegia and hemiparesis following cerebral infarction affecting right dominant side: Secondary | ICD-10-CM | POA: Diagnosis not present

## 2015-12-05 DIAGNOSIS — Z7901 Long term (current) use of anticoagulants: Secondary | ICD-10-CM | POA: Diagnosis not present

## 2015-12-05 DIAGNOSIS — F0390 Unspecified dementia without behavioral disturbance: Secondary | ICD-10-CM | POA: Diagnosis not present

## 2015-12-05 DIAGNOSIS — I480 Paroxysmal atrial fibrillation: Secondary | ICD-10-CM | POA: Diagnosis not present

## 2015-12-12 DIAGNOSIS — Z9181 History of falling: Secondary | ICD-10-CM | POA: Diagnosis not present

## 2015-12-12 DIAGNOSIS — I69351 Hemiplegia and hemiparesis following cerebral infarction affecting right dominant side: Secondary | ICD-10-CM | POA: Diagnosis not present

## 2015-12-12 DIAGNOSIS — I5032 Chronic diastolic (congestive) heart failure: Secondary | ICD-10-CM | POA: Diagnosis not present

## 2015-12-12 DIAGNOSIS — I110 Hypertensive heart disease with heart failure: Secondary | ICD-10-CM | POA: Diagnosis not present

## 2015-12-12 DIAGNOSIS — I480 Paroxysmal atrial fibrillation: Secondary | ICD-10-CM | POA: Diagnosis not present

## 2015-12-12 DIAGNOSIS — Z7901 Long term (current) use of anticoagulants: Secondary | ICD-10-CM | POA: Diagnosis not present

## 2015-12-12 DIAGNOSIS — F0390 Unspecified dementia without behavioral disturbance: Secondary | ICD-10-CM | POA: Diagnosis not present

## 2015-12-14 DIAGNOSIS — F322 Major depressive disorder, single episode, severe without psychotic features: Secondary | ICD-10-CM | POA: Diagnosis not present

## 2015-12-15 DIAGNOSIS — I69351 Hemiplegia and hemiparesis following cerebral infarction affecting right dominant side: Secondary | ICD-10-CM | POA: Diagnosis not present

## 2015-12-15 DIAGNOSIS — I480 Paroxysmal atrial fibrillation: Secondary | ICD-10-CM | POA: Diagnosis not present

## 2015-12-15 DIAGNOSIS — I5032 Chronic diastolic (congestive) heart failure: Secondary | ICD-10-CM | POA: Diagnosis not present

## 2015-12-15 DIAGNOSIS — Z7901 Long term (current) use of anticoagulants: Secondary | ICD-10-CM | POA: Diagnosis not present

## 2015-12-15 DIAGNOSIS — F0390 Unspecified dementia without behavioral disturbance: Secondary | ICD-10-CM | POA: Diagnosis not present

## 2015-12-15 DIAGNOSIS — I110 Hypertensive heart disease with heart failure: Secondary | ICD-10-CM | POA: Diagnosis not present

## 2015-12-15 DIAGNOSIS — Z9181 History of falling: Secondary | ICD-10-CM | POA: Diagnosis not present

## 2015-12-18 DIAGNOSIS — Z9181 History of falling: Secondary | ICD-10-CM | POA: Diagnosis not present

## 2015-12-18 DIAGNOSIS — Z7901 Long term (current) use of anticoagulants: Secondary | ICD-10-CM | POA: Diagnosis not present

## 2015-12-18 DIAGNOSIS — I110 Hypertensive heart disease with heart failure: Secondary | ICD-10-CM | POA: Diagnosis not present

## 2015-12-18 DIAGNOSIS — I5032 Chronic diastolic (congestive) heart failure: Secondary | ICD-10-CM | POA: Diagnosis not present

## 2015-12-18 DIAGNOSIS — I480 Paroxysmal atrial fibrillation: Secondary | ICD-10-CM | POA: Diagnosis not present

## 2015-12-18 DIAGNOSIS — I69351 Hemiplegia and hemiparesis following cerebral infarction affecting right dominant side: Secondary | ICD-10-CM | POA: Diagnosis not present

## 2015-12-18 DIAGNOSIS — F0390 Unspecified dementia without behavioral disturbance: Secondary | ICD-10-CM | POA: Diagnosis not present

## 2015-12-21 DIAGNOSIS — Z9181 History of falling: Secondary | ICD-10-CM | POA: Diagnosis not present

## 2015-12-21 DIAGNOSIS — I5032 Chronic diastolic (congestive) heart failure: Secondary | ICD-10-CM | POA: Diagnosis not present

## 2015-12-21 DIAGNOSIS — F0390 Unspecified dementia without behavioral disturbance: Secondary | ICD-10-CM | POA: Diagnosis not present

## 2015-12-21 DIAGNOSIS — I480 Paroxysmal atrial fibrillation: Secondary | ICD-10-CM | POA: Diagnosis not present

## 2015-12-21 DIAGNOSIS — I69351 Hemiplegia and hemiparesis following cerebral infarction affecting right dominant side: Secondary | ICD-10-CM | POA: Diagnosis not present

## 2015-12-21 DIAGNOSIS — F329 Major depressive disorder, single episode, unspecified: Secondary | ICD-10-CM | POA: Diagnosis not present

## 2015-12-21 DIAGNOSIS — Z7901 Long term (current) use of anticoagulants: Secondary | ICD-10-CM | POA: Diagnosis not present

## 2015-12-21 DIAGNOSIS — I110 Hypertensive heart disease with heart failure: Secondary | ICD-10-CM | POA: Diagnosis not present

## 2015-12-26 DIAGNOSIS — I480 Paroxysmal atrial fibrillation: Secondary | ICD-10-CM | POA: Diagnosis not present

## 2015-12-26 DIAGNOSIS — Z9181 History of falling: Secondary | ICD-10-CM | POA: Diagnosis not present

## 2015-12-26 DIAGNOSIS — I69351 Hemiplegia and hemiparesis following cerebral infarction affecting right dominant side: Secondary | ICD-10-CM | POA: Diagnosis not present

## 2015-12-26 DIAGNOSIS — Z7901 Long term (current) use of anticoagulants: Secondary | ICD-10-CM | POA: Diagnosis not present

## 2015-12-26 DIAGNOSIS — F329 Major depressive disorder, single episode, unspecified: Secondary | ICD-10-CM | POA: Diagnosis not present

## 2015-12-26 DIAGNOSIS — F0390 Unspecified dementia without behavioral disturbance: Secondary | ICD-10-CM | POA: Diagnosis not present

## 2015-12-26 DIAGNOSIS — I110 Hypertensive heart disease with heart failure: Secondary | ICD-10-CM | POA: Diagnosis not present

## 2015-12-26 DIAGNOSIS — I5032 Chronic diastolic (congestive) heart failure: Secondary | ICD-10-CM | POA: Diagnosis not present

## 2016-01-09 DIAGNOSIS — I5032 Chronic diastolic (congestive) heart failure: Secondary | ICD-10-CM | POA: Diagnosis not present

## 2016-01-09 DIAGNOSIS — F0390 Unspecified dementia without behavioral disturbance: Secondary | ICD-10-CM | POA: Diagnosis not present

## 2016-01-09 DIAGNOSIS — F329 Major depressive disorder, single episode, unspecified: Secondary | ICD-10-CM | POA: Diagnosis not present

## 2016-01-09 DIAGNOSIS — I110 Hypertensive heart disease with heart failure: Secondary | ICD-10-CM | POA: Diagnosis not present

## 2016-01-09 DIAGNOSIS — I69351 Hemiplegia and hemiparesis following cerebral infarction affecting right dominant side: Secondary | ICD-10-CM | POA: Diagnosis not present

## 2016-01-09 DIAGNOSIS — Z7901 Long term (current) use of anticoagulants: Secondary | ICD-10-CM | POA: Diagnosis not present

## 2016-01-09 DIAGNOSIS — I480 Paroxysmal atrial fibrillation: Secondary | ICD-10-CM | POA: Diagnosis not present

## 2016-01-09 DIAGNOSIS — Z9181 History of falling: Secondary | ICD-10-CM | POA: Diagnosis not present

## 2016-04-16 DIAGNOSIS — I480 Paroxysmal atrial fibrillation: Secondary | ICD-10-CM | POA: Diagnosis not present

## 2016-04-16 DIAGNOSIS — N898 Other specified noninflammatory disorders of vagina: Secondary | ICD-10-CM | POA: Diagnosis not present

## 2016-04-16 DIAGNOSIS — I6930 Unspecified sequelae of cerebral infarction: Secondary | ICD-10-CM | POA: Diagnosis not present

## 2016-04-16 DIAGNOSIS — G459 Transient cerebral ischemic attack, unspecified: Secondary | ICD-10-CM | POA: Diagnosis not present

## 2016-04-16 DIAGNOSIS — E7800 Pure hypercholesterolemia, unspecified: Secondary | ICD-10-CM | POA: Diagnosis not present

## 2016-04-16 DIAGNOSIS — I5032 Chronic diastolic (congestive) heart failure: Secondary | ICD-10-CM | POA: Diagnosis not present

## 2016-04-16 DIAGNOSIS — R05 Cough: Secondary | ICD-10-CM | POA: Diagnosis not present

## 2016-04-16 DIAGNOSIS — F322 Major depressive disorder, single episode, severe without psychotic features: Secondary | ICD-10-CM | POA: Diagnosis not present

## 2016-04-16 DIAGNOSIS — F0390 Unspecified dementia without behavioral disturbance: Secondary | ICD-10-CM | POA: Diagnosis not present

## 2016-04-16 DIAGNOSIS — G629 Polyneuropathy, unspecified: Secondary | ICD-10-CM | POA: Diagnosis not present

## 2016-04-16 DIAGNOSIS — I10 Essential (primary) hypertension: Secondary | ICD-10-CM | POA: Diagnosis not present

## 2016-04-16 DIAGNOSIS — I639 Cerebral infarction, unspecified: Secondary | ICD-10-CM | POA: Diagnosis not present

## 2016-04-19 DIAGNOSIS — Z961 Presence of intraocular lens: Secondary | ICD-10-CM | POA: Diagnosis not present

## 2016-04-19 DIAGNOSIS — H25812 Combined forms of age-related cataract, left eye: Secondary | ICD-10-CM | POA: Diagnosis not present

## 2016-04-19 DIAGNOSIS — H5703 Miosis: Secondary | ICD-10-CM | POA: Diagnosis not present

## 2016-04-30 ENCOUNTER — Ambulatory Visit: Payer: PPO | Admitting: Nurse Practitioner

## 2016-05-06 ENCOUNTER — Other Ambulatory Visit: Payer: Self-pay | Admitting: *Deleted

## 2016-05-06 NOTE — Patient Outreach (Addendum)
[facility]) Care Management  05/06/2016  Susan [name] 22-Apr-1940 [id number]  Telephone Screen  Referral Date: 05/03/16 Referral Source: EMMI Prevent Referral Reason: HTN,  AFib, CVA Insurance: HTA   Outreach attempt #1 to patient. No answer. RN CM left HIPAA compliant message along with contact info.     Plan: RNCM will make outreach attempt to patient within three business days.   Lake Bells, RN, BSN, MHA/MSL, [place] Telephonic Care Manager Coordinator [facility] Direct Phone: [phone] Toll Free: [phone] Fax: [phone]

## 2016-05-07 DIAGNOSIS — H2512 Age-related nuclear cataract, left eye: Secondary | ICD-10-CM | POA: Diagnosis not present

## 2016-05-09 ENCOUNTER — Other Ambulatory Visit: Payer: Self-pay | Admitting: *Deleted

## 2016-05-09 ENCOUNTER — Ambulatory Visit: Payer: Self-pay | Admitting: *Deleted

## 2016-05-09 NOTE — Patient Outreach (Signed)
[facility] [facility]) Care Management  05/09/2016  Susan [name] [birth year] [id number]   Telephone Screen  Referral Date: 05/07/16 Referral Source: EMMII Prevent Referral Reason: HTN, AFIB Insurance: HTA   Outreach attempt # 1 spoke with patient about questionnaire completed with [facility]. HIPAA verified with patient.   Social:  Patient lives alone in a retirement community. She is independent with ADLs. She relies on her children for transportation to medical appointments. Patient verbalized using a RW and CPAP. Patient has home health services with [facility].  Conditions: Past Medical Hx: AFib, HTN, HLD, CVA, OSA, Depression, Dementia Patient reported having active symptoms of depression. She stated, she is currently taking medication for depression. Her depression seems to be getting worse, per patient. She expressed talking with someone about her symptoms. Patient stated, she is not aware of being diagnosed with  AFib. She stated, her "heart is beating normal". Patient was educated on signs and symptoms of AFib. Advised patient to write down her questions regarding AFib and to ask her PCP/cardiologist about being diagnosed with AFib, during her next office visit. Patient agreed.   Medications: Patient reported taking less than 10 meds per day. Patient reported being able to afford her medications and taking them as prescribed. Patient had no questions or concerns about her meds  Appointments:  Patient last appointment with PCP was in April 2018. Her next appointment is scheduled with her PCP in 3 months.  Advanced Directives: Patient verbalized having a living will and HPOA.  Consent: [facility] services reviewed and discussed with patient. Verbal consent for services given.   Plan: RN CM will send [facility] SW referral for possible assistance with community resources. RN CM will send [facility] SW referral for depression (score 6). RN CM advised patient to  alert MD for any changes in conditions.  RN CM advised patient to contact RNCM for any needs or concerns.   Lake Bells, RN, BSN, MHA/MSL, [place] Telephonic Care Manager Coordinator [facility] Direct Phone: [phone] Toll Free: [phone] Fax: [phone]

## 2016-05-13 ENCOUNTER — Other Ambulatory Visit: Payer: Self-pay | Admitting: Licensed Clinical Social Worker

## 2016-05-13 DIAGNOSIS — H25812 Combined forms of age-related cataract, left eye: Secondary | ICD-10-CM | POA: Diagnosis not present

## 2016-05-13 DIAGNOSIS — H2512 Age-related nuclear cataract, left eye: Secondary | ICD-10-CM | POA: Diagnosis not present

## 2016-05-13 NOTE — Patient Outreach (Signed)
[facility]) Care Management  05/13/2016  FAYELYNN DISTEL 1941/01/06 [id number]  Assessment-CSW completed outreach attempt today. CSW unable to reach patient successfully. CSW left a HIPPA compliant voice message encouraging patient to return call once available.  Plan-CSW will await return call or complete an additional outreach if needed.  Eula Fried, BSW, MSW, [email or web address] Phone: [phone] Fax: [phone]

## 2016-05-15 ENCOUNTER — Other Ambulatory Visit: Payer: Self-pay | Admitting: Licensed Clinical Social Worker

## 2016-05-15 NOTE — Patient Outreach (Signed)
[facility]) Care Management  05/15/2016  NIYANNA ASCH 1940-05-18 [id number]   Assessment- CSW received return call from patient. HIPPA verifications were received. CSW introduced self, reason for call and of [facility] social work services. Patient shares that she is interested in group therapy and is in need of resource information for this. CSW educated patient on available mental health resources including support groups through [facility]. Patient is very interested in this program. CSW provided education on the steps that she will need to take in order to get involved with program. Patient expressed understanding. CSW questioned if she could complete home visit and patient shares that gaining the support group information is what she needed. Patient is agreeable to CSW mailing out mental health resources and senior resources. Patient denies any further social work needs and is agreeable to CSW not opening her case.  Plan-CSW will mail out requested resources to patient and update [place] Management Assistant that case will not be opened at this time.  Eula Fried, BSW, MSW, [email or web address] Phone: [phone] Fax: [phone]

## 2016-05-15 NOTE — Patient Outreach (Signed)
[facility]) Care Management  05/15/2016  NORLEEN XIE 04-23-1940 [id number]  Assessment-CSW completed second outreach attempt today. CSW unable to reach patient successfully. CSW left a HIPPA compliant voice message encouraging patient to return call once available.  Plan-CSW will await return call or complete an additional outreach if needed.  Eula Fried, BSW, MSW, [email or web address] Phone: [phone] Fax: [phone]

## 2016-05-17 NOTE — Patient Outreach (Signed)
[name]) Care Management  05/17/2016  Susan [name] January 22, 1940 [id number]   Request received from Greg Cutter, LCSW to mail patient personal care resources.  Information mailed today.

## 2016-05-27 ENCOUNTER — Other Ambulatory Visit: Payer: Self-pay | Admitting: Licensed Clinical Social Worker

## 2016-05-27 NOTE — Patient Outreach (Signed)
[facility]) Care Management  05/27/2016  Susan [name] Dec 01, 1940 [id number]  Assessment- CSW received voice message from patient on 05/27/16 that she did not receive the requested resource material that was suppose to be mailed out. CSW completed return call but was unable to reach her. CSW left a HIPPA compliant voice message informing her that all resources will be mailed out again.  Plan-CSW will send request to [place] Management Assistant to mail out requested resources.  Eula Fried, BSW, MSW, [email or web address] Phone: [phone] Fax: [phone]

## 2016-05-27 NOTE — Patient Outreach (Signed)
[facility]) Care Management  05/27/2016  Susan [name] 1940-04-10 [id number]   Request from Eula Fried to resend (1st attempt not received) patient personal care resources.  Information mailed today.

## 2016-05-30 ENCOUNTER — Ambulatory Visit: Payer: PPO | Admitting: Nurse Practitioner

## 2016-05-30 DIAGNOSIS — W010XXA Fall on same level from slipping, tripping and stumbling without subsequent striking against object, initial encounter: Secondary | ICD-10-CM | POA: Diagnosis not present

## 2016-05-30 DIAGNOSIS — M25461 Effusion, right knee: Secondary | ICD-10-CM | POA: Diagnosis not present

## 2016-05-30 DIAGNOSIS — G8911 Acute pain due to trauma: Secondary | ICD-10-CM | POA: Diagnosis not present

## 2016-05-30 DIAGNOSIS — Z87891 Personal history of nicotine dependence: Secondary | ICD-10-CM | POA: Diagnosis not present

## 2016-05-30 DIAGNOSIS — S8001XA Contusion of right knee, initial encounter: Secondary | ICD-10-CM | POA: Diagnosis not present

## 2016-05-30 DIAGNOSIS — I10 Essential (primary) hypertension: Secondary | ICD-10-CM | POA: Diagnosis not present

## 2016-05-30 DIAGNOSIS — Z7901 Long term (current) use of anticoagulants: Secondary | ICD-10-CM | POA: Diagnosis not present

## 2016-05-30 DIAGNOSIS — Y92512 Supermarket, store or market as the place of occurrence of the external cause: Secondary | ICD-10-CM | POA: Diagnosis not present

## 2016-05-30 DIAGNOSIS — M1711 Unilateral primary osteoarthritis, right knee: Secondary | ICD-10-CM | POA: Diagnosis not present

## 2016-06-04 ENCOUNTER — Encounter: Payer: Self-pay | Admitting: Nurse Practitioner

## 2016-06-05 ENCOUNTER — Ambulatory Visit: Payer: Self-pay | Admitting: Cardiology

## 2016-06-06 ENCOUNTER — Encounter: Payer: Self-pay | Admitting: Cardiology

## 2016-06-06 ENCOUNTER — Ambulatory Visit (INDEPENDENT_AMBULATORY_CARE_PROVIDER_SITE_OTHER): Payer: PPO | Admitting: Cardiology

## 2016-06-06 VITALS — BP 146/84 | HR 57 | Ht 61.0 in | Wt 194.1 lb

## 2016-06-06 DIAGNOSIS — E669 Obesity, unspecified: Secondary | ICD-10-CM | POA: Diagnosis not present

## 2016-06-06 DIAGNOSIS — I110 Hypertensive heart disease with heart failure: Secondary | ICD-10-CM | POA: Diagnosis not present

## 2016-06-06 DIAGNOSIS — I5032 Chronic diastolic (congestive) heart failure: Secondary | ICD-10-CM | POA: Diagnosis not present

## 2016-06-06 DIAGNOSIS — I4819 Other persistent atrial fibrillation: Secondary | ICD-10-CM

## 2016-06-06 DIAGNOSIS — G4733 Obstructive sleep apnea (adult) (pediatric): Secondary | ICD-10-CM

## 2016-06-06 DIAGNOSIS — I420 Dilated cardiomyopathy: Secondary | ICD-10-CM

## 2016-06-06 DIAGNOSIS — I5030 Unspecified diastolic (congestive) heart failure: Secondary | ICD-10-CM | POA: Diagnosis not present

## 2016-06-06 DIAGNOSIS — I481 Persistent atrial fibrillation: Secondary | ICD-10-CM

## 2016-06-06 LAB — BASIC METABOLIC PANEL
BUN / CREAT RATIO: 20 (ref 12–28)
BUN: 26 mg/dL (ref 8–27)
CO2: 24 mmol/L (ref 18–29)
CREATININE: 1.27 mg/dL — AB (ref 0.57–1.00)
Calcium: 10.3 mg/dL (ref 8.7–10.3)
Chloride: 105 mmol/L (ref 96–106)
GFR calc non Af Amer: 41 mL/min/{1.73_m2} — ABNORMAL LOW (ref >59)
GFR, EST AFRICAN AMERICAN: 47 mL/min/{1.73_m2} — AB (ref >59)
Glucose: 80 mg/dL (ref 65–99)
Potassium: 4.4 mmol/L (ref 3.5–5.2)
Sodium: 144 mmol/L (ref 134–144)

## 2016-06-06 NOTE — Progress Notes (Signed)
Cardiology Office Note    Date:  06/06/2016   ID:  Susan [name], DOB Oct 30, 1940, MRN [id number]  PCP:  Kelton Pillar, MD  Cardiologist:  Fransico Him, MD   Chief Complaint  Patient presents with  . Sleep Apnea  . Congestive Heart Failure  . Hypertension  . Cardiomyopathy  . Atrial Fibrillation    History of Present Illness:  Susan [name] is a 76 y.o. female with a history of OSA, HTN, obesity, persistent atrial fibrillation on apixaban due to [place] of 7 and chronic diastolic CHF. She is here today for followup and is doing well.  She denies chest pain or pressure, SOB, DOE, PND, orthopnea,  palpitations, dizziness or syncope.   She has chronic LE edema which is stable.She continues on CPAP and tolerates her CPAP device well. She is not having any problems with her full face mask but only uses it every other night. She says that she does not really feel rested in the am.  She says that it wakes her up and she will take it off and go back to sleep.  She goes to bed at 12MN and wakes up at 7-8am.  She denies daytime sleepiness. She denies any significant mouth or nasal dryness.  She does not know if she snores.  She does not get aerobic exercise due to the need to walk with a walker.   Past Medical History:  Diagnosis Date  . Anxiety   . Arthritis    "legs, back" (07/29/2012)  . Chronic diastolic (congestive) heart failure ([place])   . Chronic lower back pain   . Complication of anesthesia    "I have apnea" (07/29/2012)  . Dementia    per family  . Depression   . Family history of anesthesia complication    "some wake up during OR; some are hard to wake up; some both" (07/29/2012)  . GERD (gastroesophageal reflux disease)   . History of stomach ulcers 1980's  . Hyperlipidemia   . Hypertension   . Incontinent of urine    wears pads  . OSA on CPAP   . Osteoarthritis of right knee   . Pedal edema   . Persistent atrial fibrillation (HCC)    CHADS2VASC score is 7 -  on chronic anticoagulation with Apixaban  . Primary hyperparathyroidism ([place])   . Spinal stenosis   . Stroke ([place])   . Thyroid disease   . Umbilical hernia    "unrepaired" (07/29/2012)  . Varicose veins    "BLE" (07/29/2012)    Past Surgical History:  Procedure Laterality Date  . CHOLECYSTECTOMY  ~ 2010  . COLONOSCOPY    . IUD REMOVAL  1980's  . PARATHYROIDECTOMY N/A 06/16/2012   Procedure: NECK EXPLORATION AND LEFT SUPERIOR PARATHYROIDECTOMY;  Surgeon: Earnstine Regal, MD;  Location: [facility];  Service: General;  Laterality: N/A;  . 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 TOTAL KNEE ARTHROPLASTY Right 07/29/2012   Procedure: TOTAL KNEE ARTHROPLASTY;  Surgeon: Ninetta Lights, MD;  Location: [facility];  Service: Orthopedics;  Laterality: Right;    Current Medications: Current Meds  Medication Sig  . acetaminophen (TYLENOL) 500 MG tablet Take 500 mg by mouth every 6 (six) hours as needed for moderate pain.  Marland Kitchen apixaban (ELIQUIS) 5 MG TABS tablet Take 1 tablet (5 mg total) by mouth every 12 (twelve) hours.  Marland Kitchen buPROPion (WELLBUTRIN XL) 150 MG 24 hr tablet Take 450 mg by mouth daily.  . carvedilol (COREG) 6.25  MG tablet Take 1 tablet (6.25 mg total) by mouth 2 (two) times daily with a meal.  . furosemide (LASIX) 20 MG tablet Take 20 mg by mouth 2 (two) times daily.   Marland Kitchen gabapentin (NEURONTIN) 300 MG capsule Take 300 mg by mouth 2 (two) times daily.   . hydrALAZINE (APRESOLINE) 25 MG tablet Take 1 tablet (25 mg total) by mouth 3 (three) times daily.  . Multiple Vitamin (MULTIVITAMIN WITH MINERALS) TABS Take 1 tablet by mouth daily.  . pravastatin (PRAVACHOL) 40 MG tablet Take 40 mg by mouth daily.   . valsartan (DIOVAN) 320 MG tablet TAKE 1 TABLET BY MOUTH EVERY DAY    Allergies:   Ace inhibitors; Lipitor [atorvastatin]; Simvastatin; and Latex   Social History   Social History  . Marital status: Widowed    Spouse name: N/A  . Number of children: N/A  . Years of education: N/A    Occupational History  . retired Retired   Social History Main Topics  . Smoking status: Former Smoker    Packs/day: 1.00    Years: 30.00    Types: Cigarettes    Quit date: 01/07/1978  . Smokeless tobacco: Never Used  . Alcohol use No  . Drug use: No  . Sexual activity: No   Other Topics Concern  . None   Social History Narrative  . None     Family History:  The patient's family history includes Hypertension in her mother; Lung cancer in her father; Stroke in her mother.   ROS:   Please see the history of present illness.    ROS All other systems reviewed and are negative.  No flowsheet data found.     PHYSICAL EXAM:   VS:  BP (!) 146/84   Pulse (!) 57   Ht 5\' 1"  (1.549 m)   Wt 194 lb 1.9 oz (88.1 kg)   BMI 36.68 kg/m    GEN: Well nourished, well developed, in no acute distress  HEENT: normal  Neck: no JVD, carotid bruits, or masses Cardiac: RRR; no murmurs, rubs, or gallops. 1+ LLE edema.  Intact distal pulses bilaterally.  Respiratory:  clear to auscultation bilaterally, normal work of breathing GI: soft, nontender, nondistended, + BS MS: no deformity or atrophy  Skin: warm and dry, no rash Neuro:  Alert and Oriented x 3, Strength and sensation are intact Psych: euthymic mood, full affect  Wt Readings from Last 3 Encounters:  06/06/16 194 lb 1.9 oz (88.1 kg)  10/31/15 199 lb 12.8 oz (90.6 kg)  10/17/15 200 lb (90.7 kg)      Studies/Labs Reviewed:   EKG:  EKG is ordered today.    Recent Labs: 11/20/2015: ALT 23; BUN 21; Creatinine, Ser 1.28; Hemoglobin 12.4; Platelets 242; Potassium 3.7; Sodium 142; TSH 1.293   Lipid Panel    Component Value Date/Time   CHOL 215 (H) 04/06/2015 1136   TRIG 120 04/06/2015 1136   HDL 63 04/06/2015 1136   CHOLHDL 3.4 04/06/2015 1136   CHOLHDL 4.0 01/25/2015 0408   VLDL 31 01/25/2015 0408   LDLCALC 128 (H) 04/06/2015 1136    Additional studies/ records that were reviewed today include:  CPAP  download    ASSESSMENT:    1. OSA (obstructive sleep apnea)   2. Chronic diastolic (congestive) heart failure ([place])   3. 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 Obesity (BMI 30-39.9)   5. Persistent atrial fibrillation ([place])   6. DCM (  dilated cardiomyopathy) ([place])      PLAN:  In order of problems listed above:  OSA - the patient is tolerating PAP therapy well without any problems.  The patient has been using and benefiting from CPAP use and will continue to benefit from therapy. I will get a download from her DME.  2.   Chronic diastolic CHF - she appears evolemic on exam today.  Her weight is stable. She will continue on Lasix 40mg  daily.  I will check a BMET today.   She does have some LE edema but is fairly sedentary and hangs her legs down when she is sitting. I have recommended that she elevate her legs when she is sitting. I again encouraged her to follow a low sodium diet.    3.   HTN - her BP is borderline controlled on exam today. She will continue on Diovan 320mg  daily, Hydralazine 25mg  TID and carvedilol  6.25mg  BID.  Her daughter states that she does use that salt shaker with meals.  I encouraged her to avoid any added salt. I will get a 24 hour BP cuff to assess her BP.  I have given her a Rx for an Omron BP cuff.  4.   Obesity - she is limited in exercise by her mobility issues.  5.   Persistent atrial fibrillation - she is maintaining NSR. She will continue on Carvedilol and apixaban.    6.   DCM - EF was 30-35% but now normalized at 55-60% on echo 04/2015.  She will continue on Diovan 320mg  daily, Hydralazine 25mg  TID and carvedilol 6.25mg  BID.      Medication Adjustments/Labs and Tests Ordered: Current medicines are reviewed at length with the patient today.  Concerns regarding medicines are outlined above.  Medication changes, Labs and Tests ordered today are listed in the Patient Instructions  below.  Patient Instructions  Medication Instructions:  Your physician recommends that you continue on your current medications as directed. Please refer to the Current Medication list given to you today.   Labwork: TODAY: BMET  Testing/Procedures: Dr. Radford Pax has ordered for you a 24 HOUR BLOOD PRESSURE MONITOR.  Follow-Up: Your physician wants you to follow-up in: 6 months with Dr. Radford Pax. You will receive a reminder letter in the mail two months in advance. If you don't receive a letter, please call our office to schedule the follow-up appointment.   Any Other Special Instructions Will Be Listed Below (If Applicable). You have been given a prescription for a BLOOD PRESSURE CUFF. You may take the Rx to a medical supply store to get a monitor.     If you need a refill on your cardiac medications before your next appointment, please call your pharmacy.      Signed, Fransico Him, MD  06/06/2016 10:50 AM    [address], [place], [place]  [address] Phone: [phone]; Fax: [phone]

## 2016-06-06 NOTE — Patient Instructions (Addendum)
Medication Instructions:  Your physician recommends that you continue on your current medications as directed. Please refer to the Current Medication list given to you today.   Labwork: TODAY: BMET  Testing/Procedures: Dr. Radford Pax has ordered for you a 24 HOUR BLOOD PRESSURE MONITOR.  Follow-Up: Your physician wants you to follow-up in: 6 months with Dr. Radford Pax. You will receive a reminder letter in the mail two months in advance. If you don't receive a letter, please call our office to schedule the follow-up appointment.   Any Other Special Instructions Will Be Listed Below (If Applicable). You have been given a prescription for a BLOOD PRESSURE CUFF. You may take the Rx to a medical supply store to get a monitor.     Low-Sodium Eating Plan Sodium, which is an element that makes up salt, helps you maintain a healthy balance of fluids in your body. Too much sodium can increase your blood pressure and cause fluid and waste to be held in your body. Your health care provider or dietitian may recommend following this plan if you have high blood pressure (hypertension), kidney disease, liver disease, or heart failure. Eating less sodium can help lower your blood pressure, reduce swelling, and protect your heart, liver, and kidneys. What are tips for following this plan? General guidelines  Most people on this plan should limit their sodium intake to 1,500-2,000 mg (milligrams) of sodium each day. Reading food labels  The Nutrition Facts label lists the amount of sodium in one serving of the food. If you eat more than one serving, you must multiply the listed amount of sodium by the number of servings.  Choose foods with less than 140 mg of sodium per serving.  Avoid foods with 300 mg of sodium or more per serving. Shopping  Look for lower-sodium products, often labeled as "low-sodium" or "no salt added."  Always check the sodium content even if foods are labeled as "unsalted" or "no salt  added".  Buy fresh foods. ? Avoid canned foods and premade or frozen meals. ? Avoid canned, cured, or processed meats  Buy breads that have less than 80 mg of sodium per slice. Cooking  Eat more home-cooked food and less restaurant, buffet, and fast food.  Avoid adding salt when cooking. Use salt-free seasonings or herbs instead of table salt or sea salt. Check with your health care provider or pharmacist before using salt substitutes.  Cook with plant-based oils, such as canola, sunflower, or olive oil. Meal planning  When eating at a restaurant, ask that your food be prepared with less salt or no salt, if possible.  Avoid foods that contain MSG (monosodium glutamate). MSG is sometimes added to [place] food, bouillon, and some canned foods. What foods are recommended? The items listed may not be a complete list. Talk with your dietitian about what dietary choices are best for you. Grains Low-sodium cereals, including oats, puffed wheat and rice, and shredded wheat. Low-sodium crackers. Unsalted rice. Unsalted pasta. Low-sodium bread. Whole-grain breads and whole-grain pasta. Vegetables Fresh or frozen vegetables. "No salt added" canned vegetables. "No salt added" tomato sauce and paste. Low-sodium or reduced-sodium tomato and vegetable juice. Fruits Fresh, frozen, or canned fruit. Fruit juice. Meats and other protein foods Fresh or frozen (no salt added) meat, poultry, seafood, and fish. Low-sodium canned tuna and salmon. Unsalted nuts. Dried peas, beans, and lentils without added salt. Unsalted canned beans. Eggs. Unsalted nut butters. Dairy Milk. Soy milk. Cheese that is naturally low in sodium, such as ricotta  cheese, fresh mozzarella, or Swiss cheese Low-sodium or reduced-sodium cheese. Cream cheese. Yogurt. Fats and oils Unsalted butter. Unsalted margarine with no trans fat. Vegetable oils such as canola or olive oils. Seasonings and other foods Fresh and dried herbs and  spices. Salt-free seasonings. Low-sodium mustard and ketchup. Sodium-free salad dressing. Sodium-free light mayonnaise. Fresh or refrigerated horseradish. Lemon juice. Vinegar. Homemade, reduced-sodium, or low-sodium soups. Unsalted popcorn and pretzels. Low-salt or salt-free chips. What foods are not recommended? The items listed may not be a complete list. Talk with your dietitian about what dietary choices are best for you. Grains Instant hot cereals. Bread stuffing, pancake, and biscuit mixes. Croutons. Seasoned rice or pasta mixes. Noodle soup cups. Boxed or frozen macaroni and cheese. Regular salted crackers. Self-rising flour. Vegetables Sauerkraut, pickled vegetables, and relishes. Olives. [place] fries. Onion rings. Regular canned vegetables (not low-sodium or reduced-sodium). Regular canned tomato sauce and paste (not low-sodium or reduced-sodium). Regular tomato and vegetable juice (not low-sodium or reduced-sodium). Frozen vegetables in sauces. Meats and other protein foods Meat or fish that is salted, canned, smoked, spiced, or pickled. Bacon, ham, sausage, hotdogs, corned beef, chipped beef, packaged lunch meats, salt pork, jerky, pickled herring, anchovies, regular canned tuna, sardines, salted nuts. Dairy Processed cheese and cheese spreads. Cheese curds. Blue cheese. Feta cheese. String cheese. Regular cottage cheese. Buttermilk. Canned milk. Fats and oils Salted butter. Regular margarine. Ghee. Bacon fat. Seasonings and other foods Onion salt, garlic salt, seasoned salt, table salt, and sea salt. Canned and packaged gravies. Worcestershire sauce. Tartar sauce. Barbecue sauce. Teriyaki sauce. Soy sauce, including reduced-sodium. Steak sauce. Fish sauce. Oyster sauce. Cocktail sauce. Horseradish that you find on the shelf. Regular ketchup and mustard. Meat flavorings and tenderizers. Bouillon cubes. Hot sauce and Tabasco sauce. Premade or packaged marinades. Premade or packaged taco  seasonings. Relishes. Regular salad dressings. Salsa. Potato and tortilla chips. Corn chips and puffs. Salted popcorn and pretzels. Canned or dried soups. Pizza. Frozen entrees and pot pies. Summary  Eating less sodium can help lower your blood pressure, reduce swelling, and protect your heart, liver, and kidneys.  Most people on this plan should limit their sodium intake to 1,500-2,000 mg (milligrams) of sodium each day.  Canned, boxed, and frozen foods are high in sodium. Restaurant foods, fast foods, and pizza are also very high in sodium. You also get sodium by adding salt to food.  Try to cook at home, eat more fresh fruits and vegetables, and eat less fast food, canned, processed, or prepared foods. This information is not intended to replace advice given to you by your health care provider. Make sure you discuss any questions you have with your health care provider. Document Released: 06/15/2001 Document Revised: 12/18/2015 Document Reviewed: 12/18/2015 Elsevier Interactive Patient Education  2017 [employer].  If you need a refill on your cardiac medications before your next appointment, please call your pharmacy.

## 2016-06-13 DIAGNOSIS — S8001XA Contusion of right knee, initial encounter: Secondary | ICD-10-CM | POA: Diagnosis not present

## 2016-06-13 DIAGNOSIS — M542 Cervicalgia: Secondary | ICD-10-CM | POA: Diagnosis not present

## 2016-06-18 ENCOUNTER — Encounter: Payer: Self-pay | Admitting: Nurse Practitioner

## 2016-06-18 ENCOUNTER — Ambulatory Visit (INDEPENDENT_AMBULATORY_CARE_PROVIDER_SITE_OTHER): Payer: PPO | Admitting: Nurse Practitioner

## 2016-06-18 VITALS — BP 116/66 | HR 56 | Ht 61.0 in | Wt 194.8 lb

## 2016-06-18 DIAGNOSIS — E785 Hyperlipidemia, unspecified: Secondary | ICD-10-CM | POA: Insufficient documentation

## 2016-06-18 DIAGNOSIS — I4819 Other persistent atrial fibrillation: Secondary | ICD-10-CM

## 2016-06-18 DIAGNOSIS — I481 Persistent atrial fibrillation: Secondary | ICD-10-CM | POA: Diagnosis not present

## 2016-06-18 DIAGNOSIS — I639 Cerebral infarction, unspecified: Secondary | ICD-10-CM | POA: Diagnosis not present

## 2016-06-18 DIAGNOSIS — R269 Unspecified abnormalities of gait and mobility: Secondary | ICD-10-CM | POA: Diagnosis not present

## 2016-06-18 DIAGNOSIS — I10 Essential (primary) hypertension: Secondary | ICD-10-CM

## 2016-06-18 NOTE — Patient Instructions (Addendum)
Continue eliquis  for secondary stroke prevention Blood pressure goal 130/90, today's reading 116/66 continue blood pressure medications LDL cholesterol goal below 70 continue Pravachol Regular exercise to maintain ideal body weight Use walker or cane to prevent falls  will discharge from stroke clinic  Stroke Prevention Some medical conditions and behaviors are associated with an increased chance of having a stroke. You may prevent a stroke by making healthy choices and managing medical conditions. How can I reduce my risk of having a stroke?  Stay physically active. Get at least 30 minutes of activity on most or all days.  Do not smoke. It may also be helpful to avoid exposure to secondhand smoke.  Limit alcohol use. Moderate alcohol use is considered to be: ? No more than 2 drinks per day for men. ? No more than 1 drink per day for nonpregnant women.  Eat healthy foods. This involves: ? Eating 5 or more servings of fruits and vegetables a day. ? Making dietary changes that address high blood pressure (hypertension), high cholesterol, diabetes, or obesity.  Manage your cholesterol levels. ? Making food choices that are high in fiber and low in saturated fat, trans fat, and cholesterol may control cholesterol levels. ? Take any prescribed medicines to control cholesterol as directed by your health care provider.  Manage your diabetes. ? Controlling your carbohydrate and sugar intake is recommended to manage diabetes. ? Take any prescribed medicines to control diabetes as directed by your health care provider.  Control your hypertension. ? Making food choices that are low in salt (sodium), saturated fat, trans fat, and cholesterol is recommended to manage hypertension. ? Ask your health care provider if you need treatment to lower your blood pressure. Take any prescribed medicines to control hypertension as directed by your health care provider. ? If you are 31-75 years of age, have  your blood pressure checked every 3-5 years. If you are 5 years of age or older, have your blood pressure checked every year.  Maintain a healthy weight. ? Reducing calorie intake and making food choices that are low in sodium, saturated fat, trans fat, and cholesterol are recommended to manage weight.  Stop drug abuse.  Avoid taking birth control pills. ? Talk to your health care provider about the risks of taking birth control pills if you are over 77 years old, smoke, get migraines, or have ever had a blood clot.  Get evaluated for sleep disorders (sleep apnea). ? Talk to your health care provider about getting a sleep evaluation if you snore a lot or have excessive sleepiness.  Take medicines only as directed by your health care provider. ? For some people, aspirin or blood thinners (anticoagulants) are helpful in reducing the risk of forming abnormal blood clots that can lead to stroke. If you have the irregular heart rhythm of atrial fibrillation, you should be on a blood thinner unless there is a good reason you cannot take them. ? Understand all your medicine instructions.  Make sure that other conditions (such as anemia or atherosclerosis) are addressed. Get help right away if:  You have sudden weakness or numbness of the face, arm, or leg, especially on one side of the body.  Your face or eyelid droops to one side.  You have sudden confusion.  You have trouble speaking (aphasia) or understanding.  You have sudden trouble seeing in one or both eyes.  You have sudden trouble walking.  You have dizziness.  You have a loss of balance or  coordination.  You have a sudden, severe headache with no known cause.  You have new chest pain or an irregular heartbeat. Any of these symptoms may represent a serious problem that is an emergency. Do not wait to see if the symptoms will go away. Get medical help at once. Call your local emergency services (911 in [place] Do not drive  yourself to the hospital. This information is not intended to replace advice given to you by your health care provider. Make sure you discuss any questions you have with your health care provider. Document Released: 02/01/2004 Document Revised: 06/01/2015 Document Reviewed: 06/26/2012 Elsevier Interactive Patient Education  2017 [employer].

## 2016-06-18 NOTE — Progress Notes (Signed)
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 She has not had further stroke or TIA symptoms since that time. She is currently on eliquis for secondary stroke prevention and atrial fibrillation. She denies any side effects. She has minimal bruising. Blood pressure the office today 116/66. She remains on pravastatin for hyperlipidemia. She denies myalgias. She had an admission to the hospital for major depression in November 2017. She claims that is under control with Wellbutrin and sertraline. She had cataract surgery, one month ago, and says her vision is much better. She reports one fall since last seen when she was not using her walker, no apparent injury. She complains of neck pain and has an appointment this week to see orthopedist. She is wearing compression stockings to both lower extremities. 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 She is currently on Eliquis for secondary stroke prevention and atrial fibrillation. Blood pressure in the office today 146/80. She is on Pravachol for her elevated lipids. She returns today without further stroke or TIA symptoms. She is currently getting physical therapy, she had a fall while line dancing. She returns for reevaluation   04/06/15 Susan [name] lady seen today for first office follow-up visit following hospital admission for TIA and stroke in January 2017. She is accompanied by her granddaughter today. Susan [name] is an 77 y.o. female with a past medical history significant for HLD, HTN, chronic congestive heart  failure, hypothyroidism, OSA, depression, anxiety, brought in by family due to increasing weakness and inability to walk. Of note, patient was released from [facility] on 01/26/15 with diagnosis of TIA (had dizziness and right sided weakness) and had comprehensive TIA work up that was significant only for TEE with EF 30-35%, severly dilated left atrium, diffuse hypokinesis, but no mural thrombus seen. She was discharged home on apixaban for atrial fibrillation history.. Patient lives by herself and her daughter is at the bedside stating that she talked to her mother on the phone last night and her speech was not normal " like having a thick tongue". Then, this morning she noted that the speech was worse, the right face was drooping, and she couldn't use the right arm properly. Similarly, patient was so weak this morning that couldn't participate on her home PT session. Denies HA, vertigo, double vision, difficulty swallowing, or vision impairment. Endorses feeling very fatigue since yesterday.MRI brain was personally reviewed and showed a small acute nonhemorrhagic infarct posterior limb left internal capsule. Date last known well: unclear Time last known well: unclear Tpa given: no, late presentation MRI scan of the brain on 01/28/15 showed small acute left posterior limb internal capsule infarct and mild to moderate changes of chronic small vessel disease. MRA of the brain showed a high-grade left M1 and M2 branch stenosis. 2-D echo showed decreased ejection fraction of 3035% but no clot. Carotid ultrasound showed no significant extracranial stenosis. LDL cholesterol was elevated at 122. Hemoglobin A1c was 6.1. Patient was transferred to rehabilitation twice in place where she stood for 2 weeks and now she is currently at home. She can walk short distances indoors with a  cane and uses a walker for long distances. She states she is careful she had no falls. She remains on eliquis which is tolerating well without  bleeding or bruising. She states her blood pressure is quite good at home though today it is slightly elevated at 147/73. She has not had any follow-up lipid profile checked. She is tolerating Pravachol without any side effects  REVIEW OF SYSTEMS: Full 14 system review of systems performed and notable only for those listed, all others are neg:  Constitutional: neg  Cardiovascular: Leg swelling Ear/Nose/Throat: neg  Skin: neg Eyes: neg Respiratory: Cough Gastroitestinal: neg  Hematology/Lymphatic: neg  Endocrine: neg Musculoskeletal: Neck pain Allergy/Immunology: neg Neurological: neg Psychiatric: Depression Sleep : neg   ALLERGIES: Allergies  Allergen Reactions  . Ace Inhibitors Cough and Other (See Comments)  . Lipitor [Atorvastatin] Swelling and Other (See Comments)  . Simvastatin Other (See Comments)    Myalgias     . Latex Itching    HOME MEDICATIONS: Outpatient Medications Prior to Visit  Medication Sig Dispense Refill  . acetaminophen (TYLENOL) 500 MG tablet Take 500 mg by mouth every 6 (six) hours as needed for moderate pain.    Marland Kitchen apixaban (ELIQUIS) 5 MG TABS tablet Take 1 tablet (5 mg total) by mouth every 12 (twelve) hours. 60 tablet 4  . buPROPion (WELLBUTRIN XL) 150 MG 24 hr tablet Take 450 mg by mouth daily.    . carvedilol (COREG) 6.25 MG tablet Take 1 tablet (6.25 mg total) by mouth 2 (two) times daily with a meal. 60 tablet 11  . furosemide (LASIX) 20 MG tablet Take 20 mg by mouth 2 (two) times daily.     Marland Kitchen gabapentin (NEURONTIN) 300 MG capsule Take 300 mg by mouth 2 (two) times daily.     . hydrALAZINE (APRESOLINE) 25 MG tablet Take 1 tablet (25 mg total) by mouth 3 (three) times daily. 270 tablet 3  . Multiple Vitamin (MULTIVITAMIN WITH MINERALS) TABS Take 1 tablet by mouth daily.    . pravastatin (PRAVACHOL) 40 MG tablet Take 40 mg by mouth daily.     . valsartan (DIOVAN) 320 MG tablet TAKE 1 TABLET BY MOUTH EVERY DAY 30 tablet 11   No  facility-administered medications prior to visit.     PAST MEDICAL HISTORY: Past Medical History:  Diagnosis Date  . Anxiety   . Arthritis    "legs, back" (07/29/2012)  . Chronic diastolic (congestive) heart failure ([place])   . Chronic lower back pain   . Complication of anesthesia    "I have apnea" (07/29/2012)  . Dementia    per family  . Depression   . Family history of anesthesia complication    "some wake up during OR; some are hard to wake up; some both" (07/29/2012)  . GERD (gastroesophageal reflux disease)   . History of stomach ulcers 1980's  . Hyperlipidemia   . Hypertension   . Incontinent of urine    wears pads  . OSA on CPAP   . Osteoarthritis of right knee   . Pedal edema   . Persistent atrial fibrillation ([facility])    CHADS2VASC score is 7 - on chronic anticoagulation with Apixaban  . Primary hyperparathyroidism ([place])   . Spinal stenosis   . Stroke ([place])   . Thyroid disease   . Umbilical hernia    "unrepaired" (07/29/2012)  . Varicose veins    "BLE" (07/29/2012)    PAST SURGICAL HISTORY: Past Surgical History:  Procedure Laterality Date  . CATARACT  EXTRACTION    . CHOLECYSTECTOMY  ~ 2010  . COLONOSCOPY    . IUD REMOVAL  1980's  . PARATHYROIDECTOMY N/A 06/16/2012   Procedure: NECK EXPLORATION AND LEFT SUPERIOR PARATHYROIDECTOMY;  Surgeon: Earnstine Regal, MD;  Location: [facility];  Service: General;  Laterality: N/A;  . 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 TOTAL KNEE ARTHROPLASTY Right 07/29/2012   Procedure: TOTAL KNEE ARTHROPLASTY;  Surgeon: Ninetta Lights, MD;  Location: [facility];  Service: Orthopedics;  Laterality: Right;    FAMILY HISTORY: Family History  Problem Relation Age of Onset  . Hypertension Mother        deceased  . Stroke Mother   . Lung cancer Father        deceased    SOCIAL HISTORY: Social History   Social History  . Marital status: Widowed    Spouse name: N/A  . Number of children: N/A  . Years of education: N/A    Occupational History  . retired Retired   Social History Main Topics  . Smoking status: Former Smoker    Packs/day: 1.00    Years: 30.00    Types: Cigarettes    Quit date: 01/07/1978  . Smokeless tobacco: Never Used  . Alcohol use No  . Drug use: No  . Sexual activity: No   Other Topics Concern  . Not on file   Social History Narrative   Lives at home alone   Right-handed   Caffeine: 1 cup of coffee per day     PHYSICAL EXAM  Vitals:   06/18/16 1336  Weight: 194 lb 12.8 oz (88.4 kg)  Height: 5\' 1"  (1.549 m)   Body mass index is 36.81 kg/m. General: well developed, well nourished elderly lady , seated, in no evident distress Head: head normocephalic and atraumatic.  Neck: supple with no carotid bruits Cardiovascular: regular rate and rhythm, no murmurs Musculoskeletal: no deformity Skin:  no rash/petichiae  Neurological examination  Mental Status: Awake and fully alert. Oriented to place and time. Recent and remote memory intact. Attention span, concentration and fund of knowledge appropriate. Mood and affect appropriate.  Cranial Nerves: Pupils equal, briskly reactive to light. Extraocular movements full without nystagmus. Visual fields full to confrontation. Hearing intact. Facial sensation intact. Face, tongue, palate moves normally and symmetrically.  Motor: Normal bulk and tone. Normal strength in all tested extremity muscles.   Sensory.: intact to touch , on the face arms and legs  Coordination: Rapid alternating movements normal in all extremities. Finger-to-nose and heel-to-shin performed accurately bilaterally. Gait and Station: Arises from chair with push off.  Stance is normal. Gait demonstrates normal stride length and balance . Able to heel, toe and tandem walk with mild difficulty. Ambulates with a rolling walker Reflexes: 1+ and symmetric. Toes downgoing.   DIAGNOSTIC DATA (LABS, IMAGING, TESTING) - I reviewed patient records, labs, notes, testing and  imaging myself where available.  Lab Results  Component Value Date   WBC 3.5 (L) 11/20/2015   HGB 12.4 11/20/2015   HCT 36.9 11/20/2015   MCV 93.2 11/20/2015   PLT 242 11/20/2015      Component Value Date/Time   NA 144 06/06/2016 1054   K 4.4 06/06/2016 1054   CL 105 06/06/2016 1054   CO2 24 06/06/2016 1054   GLUCOSE 80 06/06/2016 1054   GLUCOSE 82 11/20/2015 1552   BUN 26 06/06/2016 1054   CREATININE 1.27 (H) 06/06/2016 1054   CREATININE 1.03 (H) 05/29/2015 1541   CALCIUM 10.3  06/06/2016 1054   PROT 7.5 11/20/2015 1552   PROT 6.6 04/06/2015 1136   ALBUMIN 4.3 11/20/2015 1552   ALBUMIN 4.2 04/06/2015 1136   AST 26 11/20/2015 1552   ALT 23 11/20/2015 1552   ALKPHOS 72 11/20/2015 1552   BILITOT 1.0 11/20/2015 1552   BILITOT 0.6 04/06/2015 1136   GFRNONAA 41 (L) 06/06/2016 1054   GFRAA 47 (L) 06/06/2016 1054   Lab Results  Component Value Date   CHOL 215 (H) 04/06/2015   HDL 63 04/06/2015   LDLCALC 128 (H) 04/06/2015   TRIG 120 04/06/2015   CHOLHDL 3.4 04/06/2015   Lab Results  Component Value Date   HGBA1C 6.1 (H) 01/25/2015    ASSESSMENT AND PLAN 85 year lady with left brain subcortical infarct in January 2017 secondary to small vessel disease. Vascular risk factors of hypertension hyperlipidemia and age and atrial fibrillation The patient is a current patient of Dr. Leonie Man  who is out of the office today . This note is sent to the work in doctor.       PLAN: Continue eliquis  for secondary stroke prevention Blood pressure goal 130/90, today's reading 116/66 continue blood pressure medications LDL cholesterol goal below 70 continue Pravachol Regular exercise to maintain ideal body weight Use walker or cane to prevent falls  will discharge from stroke clinic I spent 25 min  in total face to face time with the patient and daughter by phone, more than 50% of which was spent counseling and coordination of care, reviewing test results reviewing medications and  discussing and reviewing the diagnosis of stroke and management of risk factors. ,  Rayburn Ma, [facility], APRN  [facility] [address], [place], [place] [address] [phone]

## 2016-06-19 ENCOUNTER — Telehealth: Payer: Self-pay | Admitting: Nurse Practitioner

## 2016-06-19 NOTE — Telephone Encounter (Signed)
Patient called office in reference to being a member a the [employer], patient goes 2 times a week and would like to know if there are any exercises Hoyle Sauer recommends patient doing.  Please call

## 2016-06-19 NOTE — Telephone Encounter (Signed)
Called patient. Relayed message per CM,NP note. Patient verbalized understanding and appreciation for call.

## 2016-06-19 NOTE — Telephone Encounter (Signed)
She needs to get 30 minutes of exercise every day by  walking and [facility] [employer] [facility]. I have no specific exercises. The purpose of exercises to increase her strength and flexibility and endurance. She was discharged from the stroke clinic

## 2016-06-20 ENCOUNTER — Other Ambulatory Visit: Payer: Self-pay | Admitting: Cardiology

## 2016-06-20 DIAGNOSIS — I420 Dilated cardiomyopathy: Secondary | ICD-10-CM

## 2016-06-20 DIAGNOSIS — I5030 Unspecified diastolic (congestive) heart failure: Principal | ICD-10-CM

## 2016-06-20 DIAGNOSIS — I110 Hypertensive heart disease with heart failure: Secondary | ICD-10-CM

## 2016-06-20 DIAGNOSIS — M542 Cervicalgia: Secondary | ICD-10-CM | POA: Diagnosis not present

## 2016-06-21 ENCOUNTER — Ambulatory Visit (INDEPENDENT_AMBULATORY_CARE_PROVIDER_SITE_OTHER): Payer: PPO

## 2016-06-21 DIAGNOSIS — I10 Essential (primary) hypertension: Secondary | ICD-10-CM

## 2016-06-21 DIAGNOSIS — I5030 Unspecified diastolic (congestive) heart failure: Secondary | ICD-10-CM

## 2016-06-21 DIAGNOSIS — I110 Hypertensive heart disease with heart failure: Secondary | ICD-10-CM

## 2016-06-21 DIAGNOSIS — I420 Dilated cardiomyopathy: Secondary | ICD-10-CM

## 2016-06-21 NOTE — Progress Notes (Signed)
I have reviewed and agreed above plan. 

## 2016-06-25 ENCOUNTER — Telehealth: Payer: Self-pay

## 2016-06-25 DIAGNOSIS — I110 Hypertensive heart disease with heart failure: Secondary | ICD-10-CM

## 2016-06-25 DIAGNOSIS — I10 Essential (primary) hypertension: Secondary | ICD-10-CM

## 2016-06-25 DIAGNOSIS — I5030 Unspecified diastolic (congestive) heart failure: Principal | ICD-10-CM

## 2016-06-25 NOTE — Telephone Encounter (Signed)
ABPM showed average BP 166/80 and HR 67. Per Dr. Radford Pax, patient is to INCREASE HYDRALAZINE to 50 mg TID and have follow-up in HTN CLINIC.  Called to give patient instruction, but she states she is busy and will call back tomorrow.

## 2016-06-26 NOTE — Telephone Encounter (Signed)
Follow up    Pt is calling back for [facility].

## 2016-06-26 NOTE — Telephone Encounter (Signed)
Pt returning call again, told her you were in clinic and would be after patients before call is returned -[phone]

## 2016-06-26 NOTE — Telephone Encounter (Signed)
Left message to call back  

## 2016-06-26 NOTE — Telephone Encounter (Signed)
Follow Up:; ° ° °Returning your call. °

## 2016-06-27 NOTE — Telephone Encounter (Signed)
Patient states she is currently only taking Hydralazine 25 mg BID. She refuses to/cannot take any TID medications. She is set in her ways and cannot add a third medication time. She understands she will be called with further recommendations.

## 2016-06-27 NOTE — Telephone Encounter (Signed)
F/U call: patient returning call to you, thanks.

## 2016-06-27 NOTE — Telephone Encounter (Signed)
Increase hydralazine to 50mg  BID and check BP daily for a week and call with the results

## 2016-06-27 NOTE — Telephone Encounter (Signed)
Left message to call back  

## 2016-06-27 NOTE — Telephone Encounter (Signed)
Informed patient of BP monitor results. Instructed patient to INCREASE HYDRALAZINE to 50 mg TID.  At current, she thinks she only takes hydralazine 25 mg daily, but she is not home or near her medications to confirm. She will call back to confirm her hydralazine dosage/frequency for appropriate uptitration. Patient agrees with treatment plan.

## 2016-07-03 ENCOUNTER — Telehealth: Payer: Self-pay | Admitting: Cardiology

## 2016-07-03 MED ORDER — HYDRALAZINE HCL 50 MG PO TABS: 50.0000 mg | ORAL_TABLET | Freq: Two times a day (BID) | ORAL | 3 refills | Status: DC

## 2016-07-03 NOTE — Telephone Encounter (Signed)
Please get a 24 hour BP monitor 

## 2016-07-03 NOTE — Telephone Encounter (Signed)
Patient made aware that she needs to increase her Hydralazine to 50 mg BID. Rx sent to patient's preferred pharmacy. Patient notified to check her BP for a week and call us back with the readings. Patient states that she does not have a way to check her BP at home. Please advise.

## 2016-07-03 NOTE — Telephone Encounter (Signed)
Left detailed message on patient's voicemail letting her  That we have ordered a 24 hour BP monitor and that someone would be calling her to schedule it. Instructed for patient to call back with any questions.

## 2016-07-04 DIAGNOSIS — M542 Cervicalgia: Secondary | ICD-10-CM | POA: Diagnosis not present

## 2016-07-08 NOTE — Telephone Encounter (Signed)
Holter has been scheduled 7/10.

## 2016-07-16 ENCOUNTER — Ambulatory Visit (INDEPENDENT_AMBULATORY_CARE_PROVIDER_SITE_OTHER): Payer: PPO

## 2016-07-16 ENCOUNTER — Encounter: Payer: Self-pay | Admitting: *Deleted

## 2016-07-16 DIAGNOSIS — I10 Essential (primary) hypertension: Secondary | ICD-10-CM

## 2016-07-16 NOTE — Progress Notes (Signed)
Patient ID: Susan [name], female   DOB: [birth year], 76 y.o.   MRN: [id number]  24 hour ambulatory blood pressure monitor applied to patient using standard adult cuff.

## 2016-07-22 DIAGNOSIS — M542 Cervicalgia: Secondary | ICD-10-CM | POA: Diagnosis not present

## 2016-07-22 DIAGNOSIS — M47812 Spondylosis without myelopathy or radiculopathy, cervical region: Secondary | ICD-10-CM | POA: Diagnosis not present

## 2016-07-22 DIAGNOSIS — M5032 Other cervical disc degeneration, mid-cervical region, unspecified level: Secondary | ICD-10-CM | POA: Diagnosis not present

## 2016-07-25 ENCOUNTER — Telehealth: Payer: Self-pay

## 2016-07-25 DIAGNOSIS — I10 Essential (primary) hypertension: Secondary | ICD-10-CM

## 2016-07-25 DIAGNOSIS — M542 Cervicalgia: Secondary | ICD-10-CM | POA: Diagnosis not present

## 2016-07-25 DIAGNOSIS — I110 Hypertensive heart disease with heart failure: Secondary | ICD-10-CM

## 2016-07-25 NOTE — Telephone Encounter (Signed)
Ambulatory Blood Pressure Report showed average BP= 166/82 and HR 66. Per Dr. Radford Pax, "BP still poorly controlled. Increase Coreg to 12.5 mg BID and repeat 24 hour BP monitor."  Called to discuss results with patient. She is at dinner. She states she will call tomorrow.

## 2016-07-26 MED ORDER — CARVEDILOL 12.5 MG PO TABS: 12.5000 mg | ORAL_TABLET | Freq: Two times a day (BID) | ORAL | 1 refills | Status: DC

## 2016-07-26 NOTE — Telephone Encounter (Signed)
Notified the pt that per Dr Radford Pax, her ambulatory BP report showed average BP was 166/82 and HR-66.  Informed the pt that per Dr Radford Pax, "BP is poorly controlled." Informed the pt that per Dr Radford Pax, she recommends that we increase her carvedilol to 12.5 mg po BID, and have a repeat 24 hour BP monitor."  Confirmed the pharmacy of choice with the pt.  Informed the pt that I will place the order in the system, and have a [facility] scheduler call her back, to arrange her 24 hr BP monitor appt.  Pt verbalized understanding and agrees with this plan.

## 2016-07-26 NOTE — Telephone Encounter (Signed)
Called daughter back and she three way called her mother. We discussed medication changes. Patient stated her pharmacy changed patient's Valsartan 320 to Irbesartan 150 mg. Patient is coming in for BP monitor to monitor all her medication changes on 08/16/16. Encouraged patient to let our office know if she feels dizzy or lightheaded with medication changes. Will forward to Dr. Radford Pax, so she is aware of the Diovan change and for further advisement.  Patient and her daughter verbalized understanding.

## 2016-07-26 NOTE — Telephone Encounter (Signed)
Follow Up   Pt daughter now calling, requests a call back

## 2016-07-26 NOTE — Telephone Encounter (Signed)
New message     Pt daughter is calling asking for a call back. She said it's about medication that is supposed to be called in. Please call.

## 2016-07-26 NOTE — Telephone Encounter (Signed)
New Message    Pt is returning [place] call from yesterday for bp results

## 2016-07-30 DIAGNOSIS — M542 Cervicalgia: Secondary | ICD-10-CM | POA: Diagnosis not present

## 2016-08-02 ENCOUNTER — Telehealth: Payer: Self-pay | Admitting: Cardiology

## 2016-08-02 NOTE — Telephone Encounter (Signed)
Lm to call back ./cy 

## 2016-08-02 NOTE — Telephone Encounter (Signed)
Pt called re having 2 strengths of the carvedilol was recently increased to 12.5 mg  From  6.25 mg pt  did not know what to do with  lower dose  Instructed that may take 2 of the 6.25 mg twice daily and when runs out then can switch over to the 12.5 mg pt verbalizes understanding ./cy

## 2016-08-02 NOTE — Telephone Encounter (Signed)
F/u message ° °Pt returning RN call. Please call back to discuss  °

## 2016-08-02 NOTE — Telephone Encounter (Signed)
°  New Prob ° ° °Pt has some questions regarding her Carvedilol medication. Please call. °

## 2016-08-16 ENCOUNTER — Ambulatory Visit (INDEPENDENT_AMBULATORY_CARE_PROVIDER_SITE_OTHER): Payer: PPO

## 2016-08-16 DIAGNOSIS — I10 Essential (primary) hypertension: Secondary | ICD-10-CM | POA: Diagnosis not present

## 2016-08-16 DIAGNOSIS — I110 Hypertensive heart disease with heart failure: Secondary | ICD-10-CM

## 2016-08-19 ENCOUNTER — Telehealth: Payer: Self-pay | Admitting: Cardiology

## 2016-08-19 NOTE — Telephone Encounter (Signed)
Called patient back about her message. Patient stated that irbesartan that she started about 3 weeks ago is causing her to have a headaches. Informed patient that she was switched to irbesartan because of the recall for valsartan. Will forward to pharmacy for recommendations, since Dr. Radford Pax is out of the office this week.

## 2016-08-19 NOTE — Telephone Encounter (Signed)
Patient may be able to get valsartan from another pharmacy as several of the [facility] in town have not been affected by the recall. It appears her pharmacy changed her without contacting our office per previous phone note. Alternatively could consider change to losartan 50mg  daily. I would encourage her to monitor her pressures as head ache could be a symptom of elevated pressures. If she is unable to monitor would schedule her for blood pressure check in HTN clinic in 2-3 weeks.

## 2016-08-19 NOTE — Telephone Encounter (Signed)
New message    Pt c/o medication issue:  1. Name of Medication: irbesartan  2. How are you currently taking this medication (dosage and times per day)? 150 mg  3. Are you having a reaction (difficulty breathing--STAT)? Headache   4. What is your medication issue? Pt states that this medication is causing her to have a headache.

## 2016-08-20 MED ORDER — LOSARTAN POTASSIUM 50 MG PO TABS
50.0000 mg | ORAL_TABLET | Freq: Every day | ORAL | 11 refills | Status: DC
Start: 2016-08-20 — End: 2016-08-22

## 2016-08-20 NOTE — Telephone Encounter (Signed)
Instructed patient to STOP IRBESARTAN and START LOSARTAN 50 mg daily. She has no BP monitor at home to check BP. She will keep OV already scheduled with M. Bonnell Public 9/4. Patient was grateful for call and agrees with treatment plan.

## 2016-08-21 ENCOUNTER — Telehealth: Payer: Self-pay | Admitting: Cardiology

## 2016-08-21 NOTE — Telephone Encounter (Signed)
New message   Pt calling for holter monitor results. She states no one called her to give them to her

## 2016-08-21 NOTE — Telephone Encounter (Signed)
Left message to call back  

## 2016-08-21 NOTE — Telephone Encounter (Signed)
Susan [name]( [place] [facility]) is calling to find out if they can put the losartan in the next pill pack for the following month because she is on Irbesartan 150mg  and they have already packed her pills . Please call

## 2016-08-21 NOTE — Telephone Encounter (Signed)
Confirmed with Rachel Bo, [facility] that medications will need to be changed because the patient called yesterday saying the Irbesartan made her have headaches. He was grateful for call.

## 2016-08-21 NOTE — Telephone Encounter (Signed)
Informed patient her average BP on ABPM was 152/73. Patient has had several medication changes due to Valsartan recall. Patient is now taking Losartan 50 mg daily. She has follow-up scheduled with Gerrianne Scale, PA 9/4. Patient agrees with treatment plan.

## 2016-08-22 ENCOUNTER — Telehealth: Payer: Self-pay | Admitting: Cardiology

## 2016-08-22 MED ORDER — LOSARTAN POTASSIUM 50 MG PO TABS: 50.0000 mg | ORAL_TABLET | Freq: Every day | ORAL | 0 refills | Status: DC

## 2016-08-22 NOTE — Telephone Encounter (Signed)
Patient is on a train to [place] currently. Informed her a prescription will be called in to [facility]. She will pick up prescription and take today.  She was grateful for call and agrees with treatment plan.

## 2016-08-22 NOTE — Telephone Encounter (Signed)
New message       Pt c/o medication issue:  1. Name of Medication: losartan 2. How are you currently taking this medication (dosage and times per day)?   3. Are you having a reaction (difficulty breathing--STAT)?  no 4. What is your medication issue? Pt was to start taking this medication yesterday, but the pharmacy did not deliver it before she went out of town.  Pt is now out of town and want to know if she can wait until tues when she return to get medication to start losartan.  This medication was called in because the other medication was causing her to have headaches.  She did bring the headache causing medication (she did not know the name of rx) with her out of town but did not want to take it if possible.  Please call

## 2016-09-10 ENCOUNTER — Ambulatory Visit (INDEPENDENT_AMBULATORY_CARE_PROVIDER_SITE_OTHER): Payer: PPO | Admitting: Physician Assistant

## 2016-09-10 ENCOUNTER — Encounter: Payer: Self-pay | Admitting: Physician Assistant

## 2016-09-10 VITALS — BP 132/84 | HR 53 | Ht 61.0 in | Wt 192.2 lb

## 2016-09-10 DIAGNOSIS — I4819 Other persistent atrial fibrillation: Secondary | ICD-10-CM

## 2016-09-10 DIAGNOSIS — I10 Essential (primary) hypertension: Secondary | ICD-10-CM

## 2016-09-10 DIAGNOSIS — I481 Persistent atrial fibrillation: Secondary | ICD-10-CM

## 2016-09-10 DIAGNOSIS — I5032 Chronic diastolic (congestive) heart failure: Secondary | ICD-10-CM | POA: Diagnosis not present

## 2016-09-10 DIAGNOSIS — I420 Dilated cardiomyopathy: Secondary | ICD-10-CM

## 2016-09-10 NOTE — Progress Notes (Signed)
Cardiology Office Note    Date:  09/10/2016   ID:  DALYN KJOS, DOB 1940/03/17, MRN [id number]  PCP:  Kelton Pillar, MD  Cardiologist: Dr. Radford Pax  No chief complaint on file.   History of Present Illness:  NAELANI LAFRANCE is a 76 y.o. 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  Last saw Dr. Radford Pax 05/2016 doing well. She maintained normal sinus rhythm. Blood pressure was borderline high and she was given a prescription for 24-hour blood pressure cuff. Pharmacy change the patient's valsartan 320 mg to irbesartan 150 mg, this was then changed to losartan 50 mg daily.  Patient comes in today alone but is recording the visit so her daughter can hear what I have to say. Patient is very confused over her medications. The pharmacy usually 3 packages her medicines but she was sent to different groups and she is only taking 4 medications. She's taking both ibersartan and losartan as well as Wellbutrin and hydralazine. She is only taking [place]'s once a day and not taking her other medications. She is quite confused over everything even though the pharmacy prepackaged is her medications.     Past Medical History:  Diagnosis Date  . Anxiety   . Arthritis    "legs, back" (07/29/2012)  . Chronic diastolic (congestive) heart failure ([place])   . Chronic lower back pain   . Complication of anesthesia    "I have apnea" (07/29/2012)  . Dementia    per family  . Depression   . Family history of anesthesia complication    "some wake up during OR; some are hard to wake up; some both" (07/29/2012)  . GERD (gastroesophageal reflux disease)   . History of stomach ulcers 1980's  . Hyperlipidemia   . Hypertension   . Incontinent of urine    wears pads  . OSA on CPAP   . Osteoarthritis of  right knee   . Pedal edema   . Persistent atrial fibrillation (HCC)    CHADS2VASC score is 7 - on chronic anticoagulation with Apixaban  . Primary hyperparathyroidism ([place])   . Spinal stenosis   . Stroke ([place])   . Thyroid disease   . Umbilical hernia    "unrepaired" (07/29/2012)  . Varicose veins    "BLE" (07/29/2012)    Past Surgical History:  Procedure Laterality Date  . CATARACT EXTRACTION    . CHOLECYSTECTOMY  ~ 2010  . COLONOSCOPY    . IUD REMOVAL  1980's  . PARATHYROIDECTOMY N/A 06/16/2012   Procedure: NECK EXPLORATION AND LEFT SUPERIOR PARATHYROIDECTOMY;  Surgeon: Earnstine Regal, MD;  Location: [facility];  Service: General;  Laterality: N/A;  . 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 TOTAL KNEE ARTHROPLASTY Right 07/29/2012   Procedure: TOTAL KNEE ARTHROPLASTY;  Surgeon: Ninetta Lights, MD;  Location: [facility];  Service: Orthopedics;  Laterality: Right;    Current Medications: No outpatient prescriptions have been marked as taking for the 09/10/16 encounter (Office Visit) with Imogene Burn, PA-C.     Allergies:   Ace inhibitors; Lipitor [atorvastatin]; Simvastatin; and Latex   Social History   Social History  . Marital status: Widowed    Spouse name: N/A  . Number of children: N/A  . Years of education: N/A   Occupational History  . retired  Retired   Social History Main Topics  . Smoking status: Former Smoker    Packs/day: 1.00    Years: 30.00    Types: Cigarettes    Quit date: 01/07/1978  . Smokeless tobacco: Never Used  . Alcohol use No  . Drug use: No  . Sexual activity: No   Other Topics Concern  . None   Social History Narrative   Lives at home alone   Right-handed   Caffeine: 1 cup of coffee per day     Family History:  The patient's family history includes Hypertension in her mother; Lung cancer in her father; Stroke in her mother.   ROS:   Please see the history of present illness.    Review of Systems  Constitution: Positive for weakness  and malaise/fatigue.  HENT: Negative.   Eyes: Negative.   Cardiovascular: Positive for leg swelling.  Respiratory: Negative.   Hematologic/Lymphatic: Negative.   Musculoskeletal: Positive for joint pain.  Gastrointestinal: Negative.   Genitourinary: Negative.   Neurological: Positive for headaches and paresthesias.  Psychiatric/Behavioral: Positive for memory loss.   All other systems reviewed and are negative.   PHYSICAL EXAM:   VS:  Ht 5\' 1"  (1.549 m)   Physical Exam  GEN: Well nourished, well developed, in no acute distress  Neck: no JVD, carotid bruits, or masses Cardiac:RRR; no murmurs, rubs, or gallops  Respiratory:  clear to auscultation bilaterally, normal work of breathing GI: soft, nontender, nondistended, + BS Ext: without cyanosis, clubbing, or edema, Good distal pulses bilaterally Neuro:  Very confused over her medications. Psych: euthymic mood, full affect  Wt Readings from Last 3 Encounters:  06/18/16 194 lb 12.8 oz (88.4 kg)  06/06/16 194 lb 1.9 oz (88.1 kg)  10/31/15 199 lb 12.8 oz (90.6 kg)      Studies/Labs Reviewed:   EKG:  EKG is not ordered today.     Recent Labs: 11/20/2015: ALT 23; Hemoglobin 12.4; Platelets 242; TSH 1.293 06/06/2016: BUN 26; Creatinine, Ser 1.27; Potassium 4.4; Sodium 144   Lipid Panel    Component Value Date/Time   CHOL 215 (H) 04/06/2015 1136   TRIG 120 04/06/2015 1136   HDL 63 04/06/2015 1136   CHOLHDL 3.4 04/06/2015 1136   CHOLHDL 4.0 01/25/2015 0408   VLDL 31 01/25/2015 0408   LDLCALC 128 (H) 04/06/2015 1136    Additional studies/ records that were reviewed today include:    Echo 4/2017Study Conclusions   - Left ventricle: The cavity size was normal. There was mild   concentric hypertrophy. Systolic function was normal. The   estimated ejection fraction was in the range of 55% to 60%. Wall   motion was normal; there were no regional wall motion   abnormalities. - Aortic valve: Transvalvular velocity was  minimally increased.   There was no stenosis. - Mitral valve: Calcified annulus. There was mild regurgitation. - Left atrium: The atrium was mildly dilated. - Pulmonary arteries: Systolic pressure was mildly increased.   Impressions:   - EF improved when compared to prior (35%)   Nuclear stress test 2015Impression Exercise Capacity:  Lexiscan with no exercise. BP Response:  Normal blood pressure response. Clinical Symptoms:  There is dyspnea. ECG Impression:  No significant ST segment change suggestive of ischemia. Comparison with Prior Nuclear Study: No previous nuclear study performed   Overall Impression:  Normal stress nuclear study.   LV Ejection Fraction: 53%.  LV Wall Motion:  NL LV Function; NL Wall Motion   Dorothy Spark 04/13/2013  ASSESSMENT:    1. Essential hypertension   2. Chronic diastolic (congestive) heart failure (HCC)   3. Persistent atrial fibrillation ([place])   4. DCM (dilated cardiomyopathy) ([place])      PLAN:  In order of problems listed above:  Essential hypertension patient's blood pressure was elevated last office visit and she was given a prescription for 24-hour blood pressure monitoring cuff which was reviewed and blood pressures were consistently 557 systolic. Pharmacy had switched her valsartan to irbesartan then to losartan. Unfortunately she's been taking 2 ARB's and not taking her Coreg and only taken her Eliquis once daily. We have written out all her medications that she should be taking. We will have her come back and see either myself or our pharmacist to ensure she is taking them properly. Reiterated that she needs to take Eliquis BID. Her daughter is going to buy her blood pressure cuff for closer monitoring.  Chronic diastolic CHF currently compensated despite not taking her medications properly.  Persistent atrial fibrillation on Eliquis but not taking properly. And not taking Coreg. Needs close follow-up.  History of dilated  cardiomyopathy which recovered on last echo 04/2015 EF 55-60%.     Medication Adjustments/Labs and Tests Ordered: Current medicines are reviewed at length with the patient today.  Concerns regarding medicines are outlined above.  Medication changes, Labs and Tests ordered today are listed in the Patient Instructions below. There are no Patient Instructions on file for this visit.   Sumner Boast, PA-C  09/10/2016 1:03 PM    [facility] [address], [place], [place]  [address] Phone: [phone]; Fax: [phone]

## 2016-09-10 NOTE — Patient Instructions (Signed)
Medication Instructions: - Your physician recommends that you continue on your current medications as directed. Please refer to the Current Medication list given to you today.  Labwork: - Your physician recommends that you have lab work in: [employer]  Procedures/Testing: - None Ordered  Follow-Up: - Your physician recommends that you schedule a follow-up appointment in 2 weeks with our Pharmacist for medication management. If not available can see Ermalinda Barrios, PA-C  - Your physician recommends that you schedule a follow-up appointment 1st available with Dr. Radford Pax.   If you need a refill on your cardiac medications before your next appointment, please call your pharmacy.

## 2016-09-11 ENCOUNTER — Telehealth: Payer: Self-pay | Admitting: Physician Assistant

## 2016-09-11 LAB — BASIC METABOLIC PANEL
BUN/Creatinine Ratio: 18 (ref 12–28)
BUN: 17 mg/dL (ref 8–27)
CO2: 25 mmol/L (ref 20–29)
CREATININE: 0.95 mg/dL (ref 0.57–1.00)
Calcium: 10.8 mg/dL — ABNORMAL HIGH (ref 8.7–10.3)
Chloride: 105 mmol/L (ref 96–106)
GFR calc Af Amer: 66 mL/min/{1.73_m2} (ref >59)
GFR, EST NON AFRICAN AMERICAN: 58 mL/min/{1.73_m2} — AB (ref >59)
GLUCOSE: 83 mg/dL (ref 65–99)
Potassium: 4.2 mmol/L (ref 3.5–5.2)
Sodium: 143 mmol/L (ref 134–144)

## 2016-09-11 NOTE — Telephone Encounter (Signed)
-----   Message from Imogene Burn, PA-C sent at 09/11/2016  7:47 AM EDT ----- Kidney function stable

## 2016-09-11 NOTE — Telephone Encounter (Signed)
Returned pts call re: lab results and left a message for pt to call back.

## 2016-09-11 NOTE — Telephone Encounter (Signed)
Pt returned my call and she has been made aware of her lab results 

## 2016-09-11 NOTE — Telephone Encounter (Signed)
New Message ° ° pt verbalized that she is returning call for rn °

## 2016-09-25 ENCOUNTER — Ambulatory Visit: Payer: Self-pay

## 2016-09-30 ENCOUNTER — Telehealth: Payer: Self-pay | Admitting: Cardiology

## 2016-09-30 NOTE — Telephone Encounter (Signed)
Per pt this b/p reading was before meds  Will check again tomorrow and will call with multiple readings . Per pt not at home at  this time and won't be until sometime after 5:00 pm .Adonis Housekeeper

## 2016-09-30 NOTE — Telephone Encounter (Signed)
New message      Pt c/o BP issue: STAT if pt c/o blurred vision, one-sided weakness or slurred speech  1. What are your last 5 BP readings?  195/74 2. Are you having any other symptoms (ex. Dizziness, headache, blurred vision, passed out)? no 3. What is your BP issue?  Pt was calling to report a high bp reading.  However, pt also stated that she may have been taking her bp incorrectly.  She states that she will take it again tomorrow and call us.  Pt has a wrist bo monitor.

## 2016-10-11 ENCOUNTER — Ambulatory Visit (INDEPENDENT_AMBULATORY_CARE_PROVIDER_SITE_OTHER): Payer: PPO | Admitting: Pharmacist

## 2016-10-11 VITALS — BP 148/78 | HR 53

## 2016-10-11 DIAGNOSIS — Z79899 Other long term (current) drug therapy: Secondary | ICD-10-CM | POA: Diagnosis not present

## 2016-10-11 MED ORDER — FUROSEMIDE 20 MG PO TABS: ORAL_TABLET | ORAL | 11 refills | Status: DC

## 2016-10-11 NOTE — Progress Notes (Signed)
Patient ID: Susan [name]                 DOB: 08-19-1940                      MRN: [id number]     HPI: Susan [name] is a 76 y.o. female patient of Dr Radford Pax referred by Estella Husk, PA to pharmacy clinic for medication management. PMH is significant for HTN, OSA, PAF, diastolic CHF, CVA, and obesity. Pt was seen in clinic 1 month ago and expressed confusion regarding many of her medications. At that time, she was taking both irbesartan and losartan, was not taking her carvedilol, and was taking Eliquis only once daily. The pharmacy prepackages her medications however there was confusion over this as well.  Presents today with daughter assisted with walker. She does not bring in meds today but has picture of home medication in the pharmacy bubble pack. She got new shipment delivered yesterday. Medication discrepancies as noted: Pt is taking sertraline 12.5mg  BID, our medication list shows 25mg  daily. Pt has been receiving Lasix 20mg  once daily, our medication list shows 20mg  BID. She does have some swelling in her legs and wears compression stockings. Of note, she has chronic LE edema.  She reports some BP readings at home as high as 542 systolic. She uses a wrist cuff but has not bee elevating her wrist properly when she takes her readings. BP in clinic today is slightly high, however pt has not taken her AM BP medications yet. BP readings have been controlled on previous visits.  Family History: The patient's family history includes Hypertension in her mother; Lung cancer in her father; Stroke in her mother.   Social History: Former smoker 1 PPD for 30 years, quit in 1980. Denies alcohol and illicit drug use.  Wt Readings from Last 3 Encounters:  09/10/16 192 lb 3.2 oz (87.2 kg)  06/18/16 194 lb 12.8 oz (88.4 kg)  06/06/16 194 lb 1.9 oz (88.1 kg)   BP Readings from Last 3 Encounters:  09/10/16 132/84  06/18/16 116/66  06/06/16 (!) 146/84   Pulse Readings from Last 3 Encounters:  09/10/16  (!) 53  06/18/16 (!) 56  06/06/16 (!) 57    Renal function: CrCl cannot be calculated (Patient's most recent lab result is older than the maximum 21 days allowed.).  Past Medical History:  Diagnosis Date  . Anxiety   . Arthritis    "legs, back" (07/29/2012)  . Chronic diastolic (congestive) heart failure ([place])   . Chronic lower back pain   . Complication of anesthesia    "I have apnea" (07/29/2012)  . Dementia    per family  . Depression   . Family history of anesthesia complication    "some wake up during OR; some are hard to wake up; some both" (07/29/2012)  . GERD (gastroesophageal reflux disease)   . History of stomach ulcers 1980's  . Hyperlipidemia   . Hypertension   . Incontinent of urine    wears pads  . OSA on CPAP   . Osteoarthritis of right knee   . Pedal edema   . Persistent atrial fibrillation (HCC)    CHADS2VASC score is 7 - on chronic anticoagulation with Apixaban  . Primary hyperparathyroidism ([place])   . Spinal stenosis   . Stroke ([place])   . Thyroid disease   . Umbilical hernia    "unrepaired" (07/29/2012)  . Varicose veins    "BLE" (07/29/2012)  Current Outpatient Prescriptions on File Prior to Visit  Medication Sig Dispense Refill  . acetaminophen (TYLENOL) 500 MG tablet Take 500 mg by mouth every 6 (six) hours as needed for moderate pain.    Marland Kitchen apixaban (ELIQUIS) 5 MG TABS tablet Take 1 tablet (5 mg total) by mouth every 12 (twelve) hours. 60 tablet 4  . buPROPion (WELLBUTRIN XL) 150 MG 24 hr tablet Take 450 mg by mouth daily.    . carvedilol (COREG) 12.5 MG tablet Take 1 tablet (12.5 mg total) by mouth 2 (two) times daily with a meal. 180 tablet 1  . furosemide (LASIX) 20 MG tablet Take 20 mg by mouth 2 (two) times daily.     Marland Kitchen gabapentin (NEURONTIN) 300 MG capsule Take 300 mg by mouth 2 (two) times daily.     . hydrALAZINE (APRESOLINE) 50 MG tablet Take 1 tablet (50 mg total) by mouth 2 (two) times daily. 180 tablet 3  . losartan (COZAAR) 50 MG  tablet Take 1 tablet (50 mg total) by mouth daily. 10 tablet 0  . Multiple Vitamin (MULTIVITAMIN WITH MINERALS) TABS Take 1 tablet by mouth daily.    . pravastatin (PRAVACHOL) 40 MG tablet Take 40 mg by mouth daily.     . sertraline (ZOLOFT) 25 MG tablet Take 25 mg by mouth daily.      No current facility-administered medications on file prior to visit.     Allergies  Allergen Reactions  . Ace Inhibitors Cough and Other (See Comments)  . Lipitor [Atorvastatin] Swelling and Other (See Comments)  . Simvastatin Other (See Comments)    Myalgias     . Latex Itching     Assessment/Plan:  1. Medication management - Medication discrepancies noted with Lasix and sertraline. Sent in new rx for Lasix 20mg  BID since pt has only been receiving 20mg  once daily from her pharmacy. Updated medication list with correct dosage of sertraline 12.5mg  BID. Explained how to properly check BP at home using wrist BP cuff. BP slightly elevated today in clinic, however pt has not taken her AM BP medications yet. No other medication changes today. Advised pt to call clinic with any future medication-related concerns.  Megan E. Supple, PharmD, CPP, [facility] [address], [place], [place] [address] Phone: [phone]; Fax: [phone] 10/11/2016 10:18 AM

## 2016-10-11 NOTE — Patient Instructions (Signed)
It was nice to see you today  Pick up your prescription to take furosemide 20mg  TWICE daily  Continue taking your other medications  When you check your blood pressure at home, make sure to hold your wrist up near your heart for more accurate readings

## 2016-11-05 DIAGNOSIS — I480 Paroxysmal atrial fibrillation: Secondary | ICD-10-CM | POA: Diagnosis not present

## 2016-11-05 DIAGNOSIS — E213 Hyperparathyroidism, unspecified: Secondary | ICD-10-CM | POA: Diagnosis not present

## 2016-11-05 DIAGNOSIS — Z0000 Encounter for general adult medical examination without abnormal findings: Secondary | ICD-10-CM | POA: Diagnosis not present

## 2016-11-05 DIAGNOSIS — I10 Essential (primary) hypertension: Secondary | ICD-10-CM | POA: Diagnosis not present

## 2016-11-05 DIAGNOSIS — I6930 Unspecified sequelae of cerebral infarction: Secondary | ICD-10-CM | POA: Diagnosis not present

## 2016-11-05 DIAGNOSIS — E7800 Pure hypercholesterolemia, unspecified: Secondary | ICD-10-CM | POA: Diagnosis not present

## 2016-11-05 DIAGNOSIS — M150 Primary generalized (osteo)arthritis: Secondary | ICD-10-CM | POA: Diagnosis not present

## 2016-11-05 DIAGNOSIS — I5032 Chronic diastolic (congestive) heart failure: Secondary | ICD-10-CM | POA: Diagnosis not present

## 2016-11-05 DIAGNOSIS — I639 Cerebral infarction, unspecified: Secondary | ICD-10-CM | POA: Diagnosis not present

## 2016-11-05 DIAGNOSIS — M8588 Other specified disorders of bone density and structure, other site: Secondary | ICD-10-CM | POA: Diagnosis not present

## 2016-11-05 DIAGNOSIS — I420 Dilated cardiomyopathy: Secondary | ICD-10-CM | POA: Diagnosis not present

## 2016-11-05 DIAGNOSIS — F0390 Unspecified dementia without behavioral disturbance: Secondary | ICD-10-CM | POA: Diagnosis not present

## 2016-12-03 ENCOUNTER — Other Ambulatory Visit: Payer: Self-pay | Admitting: Cardiology

## 2016-12-03 NOTE — Telephone Encounter (Signed)
Medication Detail    Disp Refills Start End   hydrALAZINE (APRESOLINE) 50 MG tablet 180 tablet 3 07/03/2016 10/01/2016   Sig - Route: Take 1 tablet (50 mg total) by mouth 2 (two) times daily. - Oral   Sent to pharmacy as: hydrALAZINE (APRESOLINE) 50 MG tablet   E-Prescribing Status: Receipt confirmed by pharmacy (07/03/2016 11:58 AM EDT)   [place], [place]

## 2016-12-25 ENCOUNTER — Other Ambulatory Visit: Payer: Self-pay | Admitting: Cardiology

## 2017-01-06 NOTE — Telephone Encounter (Signed)
error 

## 2017-02-17 ENCOUNTER — Telehealth: Payer: Self-pay | Admitting: Cardiology

## 2017-02-17 NOTE — Telephone Encounter (Signed)
New message     Patient states that she was called this morning and told to bring something with her so that you could read the graph, but she doesn't know what she was suppose to bring with her.

## 2017-02-17 NOTE — Telephone Encounter (Signed)
Left message to bring CPAP chip to OV with Dr. Radford Pax on 02/20/17.

## 2017-02-19 ENCOUNTER — Telehealth: Payer: Self-pay | Admitting: Cardiology

## 2017-02-19 NOTE — Telephone Encounter (Signed)
Left message to call back  

## 2017-02-19 NOTE — Telephone Encounter (Signed)
Patient calling, states that she was told to bring "a piece of equipment) from CPAP machine the patient states the piece stuck

## 2017-02-19 NOTE — Telephone Encounter (Signed)
New message ° °Pt verbalized that she is returning call for RN °

## 2017-02-19 NOTE — Telephone Encounter (Signed)
CPAP assistant is not here today.  Per 2/11 phone note, patient is to bring her CPAP chip with her to Dr. Theodosia Blender appointment tomorrow. If patient is unable to get the chip from her device, she may bring the CPAP to her appointment for instruction and to get download.   Left message to call back to review.

## 2017-02-20 ENCOUNTER — Ambulatory Visit: Payer: PPO | Admitting: Cardiology

## 2017-02-20 ENCOUNTER — Encounter: Payer: Self-pay | Admitting: Cardiology

## 2017-02-20 ENCOUNTER — Telehealth: Payer: Self-pay

## 2017-02-20 VITALS — BP 150/78 | HR 69 | Ht 61.0 in | Wt 185.0 lb

## 2017-02-20 DIAGNOSIS — Z79899 Other long term (current) drug therapy: Secondary | ICD-10-CM

## 2017-02-20 DIAGNOSIS — I420 Dilated cardiomyopathy: Secondary | ICD-10-CM | POA: Diagnosis not present

## 2017-02-20 DIAGNOSIS — I5032 Chronic diastolic (congestive) heart failure: Secondary | ICD-10-CM | POA: Diagnosis not present

## 2017-02-20 DIAGNOSIS — I481 Persistent atrial fibrillation: Secondary | ICD-10-CM | POA: Diagnosis not present

## 2017-02-20 DIAGNOSIS — I4819 Other persistent atrial fibrillation: Secondary | ICD-10-CM

## 2017-02-20 DIAGNOSIS — I110 Hypertensive heart disease with heart failure: Secondary | ICD-10-CM

## 2017-02-20 DIAGNOSIS — G4733 Obstructive sleep apnea (adult) (pediatric): Secondary | ICD-10-CM

## 2017-02-20 NOTE — Telephone Encounter (Signed)
Notes recorded by Teressa Senter, RN on 02/20/2017 at 5:09 PM EST Patient made of lab results. Patient instructed to decrease lasix to 20 mg every other day and patient is scheduled for repeat BMET on 02/26/17. Patient verbalized understanding and thanked me for the call.   Notes recorded by Sueanne Margarita, MD on 02/20/2017 at 3:44 PM EST Creatinine bumped today. Please decrease Lasix to 20mg  qod and repeat BMET in 1 week

## 2017-02-20 NOTE — Patient Instructions (Signed)
Medication Instructions:  Your physician recommends that you continue on your current medications as directed. Please refer to the Current Medication list given to you today.  Labwork: Today for kidney function and complete blood count  Testing/Procedures: None ordered   Follow-Up: Your physician wants you to follow-up in: 6 months with PA. You will receive a reminder letter in the mail two months in advance. If you don't receive a letter, please call our office to schedule the follow-up appointment.  Your physician wants you to follow-up in: 1 year with Dr. Radford Pax. You will receive a reminder letter in the mail two months in advance. If you don't receive a letter, please call our office to schedule the follow-up appointment.   Any Other Special Instructions Will Be Listed Below (If Applicable).  Thank you for choosing [facility], RN  [phone]   If you need a refill on your cardiac medications before your next appointment, please call your pharmacy.

## 2017-02-20 NOTE — Progress Notes (Signed)
Cardiology Office Note:    Date:  02/20/2017   ID:  Susan [name], DOB [birth year], MRN [id number]  PCP:  Kelton Pillar, MD  Cardiologist:  No primary care provider on file.    Referring MD: Kelton Pillar, MD   Chief Complaint  Patient presents with  . Atrial Fibrillation  . Hypertension    History of Present Illness:    Susan [name] is a 77 y.o. 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    She is here today for followup.  She is still having problems with depression.  She has had a cough and sinus drainage recently.  She has some shooting pains in her right foot on the side.   She denies any chest pain or pressure, SOB, DOE, PND, orthopnea, LE edema, dizziness, palpitations or syncope. She is compliant with her meds and is tolerating meds with no SE.    She is not doing well with her CPAP device because she says that she does not feel that it does anything for her.  She has only been using it 12% of the time more tha 4 hours nightly.  She tolerates the mask and feels the pressure is adequate.  Since going on CPAP she feels rested in the am and has no significant daytime sleepiness.  She denies any significant mouth or nasal dryness or nasal congestion.  She does not think that he snores.    Past Medical History:  Diagnosis Date  . Anxiety   . Arthritis    "legs, back" (07/29/2012)  . Chronic diastolic (congestive) heart failure ([place])   . Chronic lower back pain   . Complication of anesthesia    "I have apnea" (07/29/2012)  . Dementia    per family  . Depression   . Family history of anesthesia complication    "some wake up during OR; some are hard to wake up; some both" (07/29/2012)  . GERD (gastroesophageal reflux disease)   . History of stomach ulcers 1980's  . Hyperlipidemia   . Hypertension   .  Incontinent of urine    wears pads  . OSA on CPAP   . Osteoarthritis of right knee   . Pedal edema   . Persistent atrial fibrillation (HCC)    CHADS2VASC score is 7 - on chronic anticoagulation with Apixaban  . Primary hyperparathyroidism ([place])   . Spinal stenosis   . Stroke ([place])   . Thyroid disease   . Umbilical hernia    "unrepaired" (07/29/2012)  . Varicose veins    "BLE" (07/29/2012)    Past Surgical History:  Procedure Laterality Date  . CATARACT EXTRACTION    . CHOLECYSTECTOMY  ~ 2010  . COLONOSCOPY    . IUD REMOVAL  1980's  . PARATHYROIDECTOMY N/A 06/16/2012   Procedure: NECK EXPLORATION AND LEFT SUPERIOR PARATHYROIDECTOMY;  Surgeon: Earnstine Regal, MD;  Location: [facility];  Service: General;  Laterality: N/A;  . 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 TOTAL KNEE ARTHROPLASTY Right 07/29/2012   Procedure: TOTAL KNEE ARTHROPLASTY;  Surgeon: Ninetta Lights, MD;  Location: [facility];  Service: Orthopedics;  Laterality: Right;    Current Medications: Current Meds  Medication Sig  . acetaminophen (TYLENOL) 500 MG tablet Take 500 mg by mouth every 6 (six) hours as needed for moderate pain.  Marland Kitchen  apixaban (ELIQUIS) 5 MG TABS tablet Take 1 tablet (5 mg total) by mouth every 12 (twelve) hours.  Marland Kitchen buPROPion (WELLBUTRIN XL) 150 MG 24 hr tablet Take 450 mg by mouth daily.  . cholecalciferol (VITAMIN D) 1000 units tablet Take by mouth.  . furosemide (LASIX) 20 MG tablet Take 1 tablet by mouth in the morning and 1 tablet in the afternoon.  . gabapentin (NEURONTIN) 300 MG capsule Take 300 mg by mouth 2 (two) times daily.   . Multiple Vitamin (MULTIVITAMIN WITH MINERALS) TABS Take 1 tablet by mouth daily.  . pravastatin (PRAVACHOL) 40 MG tablet Take 40 mg by mouth daily.   . sertraline (ZOLOFT) 25 MG tablet Take 12.5 mg by mouth 2 (two) times daily.   . vitamin B-12 (CYANOCOBALAMIN) 100 MCG tablet Take by mouth.     Allergies:   Ace inhibitors; Lipitor [atorvastatin]; Simvastatin; and  Latex   Social History   Socioeconomic History  . Marital status: Widowed    Spouse name: None  . Number of children: None  . Years of education: None  . Highest education level: None  Social Needs  . Financial resource strain: None  . Food insecurity - worry: None  . Food insecurity - inability: None  . Transportation needs - medical: None  . Transportation needs - non-medical: None  Occupational History  . Occupation: retired    Fish farm manager: RETIRED  Tobacco Use  . Smoking status: Former Smoker    Packs/day: 1.00    Years: 30.00    Pack years: 30.00    Types: Cigarettes    Last attempt to quit: 01/07/1978    Years since quitting: 39.1  . Smokeless tobacco: Never Used  Substance and Sexual Activity  . Alcohol use: No  . Drug use: No  . Sexual activity: No  Other Topics Concern  . None  Social History Narrative   Lives at home alone   Right-handed   Caffeine: 1 cup of coffee per day     Family History: The patient's family history includes Hypertension in her mother; Lung cancer in her father; Stroke in her mother.  ROS:   Please see the history of present illness.    Review of Systems  Respiratory: Positive for cough.   Psychiatric/Behavioral: Positive for depression.    All other systems reviewed and negative.   EKGs/Labs/Other Studies Reviewed:    The following studies were reviewed today: none  EKG:  EKG is not ordered today.   Recent Labs: 09/10/2016: BUN 17; Creatinine, Ser 0.95; Potassium 4.2; Sodium 143   Recent Lipid Panel    Component Value Date/Time   CHOL 215 (H) 04/06/2015 1136   TRIG 120 04/06/2015 1136   HDL 63 04/06/2015 1136   CHOLHDL 3.4 04/06/2015 1136   CHOLHDL 4.0 01/25/2015 0408   VLDL 31 01/25/2015 0408   LDLCALC 128 (H) 04/06/2015 1136    Physical Exam:    VS:  BP (!) 150/78 (BP Location: Right Arm, Patient Position: Sitting, Cuff Size: Normal)   Pulse 69   Ht 5\' 1"  (1.549 m)   Wt 185 lb (83.9 kg)   SpO2 96%   BMI 34.96  kg/m     Wt Readings from Last 3 Encounters:  02/20/17 185 lb (83.9 kg)  09/10/16 192 lb 3.2 oz (87.2 kg)  06/18/16 194 lb 12.8 oz (88.4 kg)     GEN:  Well nourished, well developed in no acute distress HEENT: Normal NECK: No JVD; No carotid bruits LYMPHATICS: No  lymphadenopathy CARDIAC: RRR, no murmurs, rubs, gallops RESPIRATORY:  Clear to auscultation without rales, wheezing or rhonchi  ABDOMEN: Soft, non-tender, non-distended MUSCULOSKELETAL:  No edema; No deformity  SKIN: Warm and dry NEUROLOGIC:  Alert and oriented x 3 PSYCHIATRIC:  Normal affect   ASSESSMENT:    1. Chronic diastolic (congestive) heart failure ([place])   2. DCM (dilated cardiomyopathy) ([place])   3. Hypertensive heart disease with chronic diastolic congestive heart failure ([place])   4. OSA (obstructive sleep apnea)   5. Persistent atrial fibrillation (HCC)    PLAN:    In order of problems listed above:  1.  Chronic diastolic CHF - she appears euvolemic on exam today.  She will continue on Lasix 20mg  daily.    2.  DCM - EF 30-35% that normalized on echo 04/2015 EF 55-60%.  3.  Hypertensive heart disease - her BP is borderline controlled on exam today.  Her BPs at home, for the most part, are controlled.  She has not taken her meds yet this am.  She will continue on Losartan 50mg  daily, Hydralazine 50mg  BID and carvedilol 12.5mg  BID.    4.  OSA - the patient is tolerating PAP therapy well without any problems. The PAP download was reviewed today and showed an AHI of 4/hr on 10 cm H2O with 12% compliance in using more than 4 hours nightly.  The patient has been using and benefiting from PAP use and will continue to benefit from therapy. I encouraged to her be more compliant with her device.   5.  Persistent atrial fibrillation she is maintaining NSR on exam.  She will continue on Carvedilol 12.5mg  BID and Apixaban 5mg  BID.  I will check a BMET and CBC due to NOAC.  She denies any bleeding problems.    Medication  Adjustments/Labs and Tests Ordered: Current medicines are reviewed at length with the patient today.  Concerns regarding medicines are outlined above.  No orders of the defined types were placed in this encounter.  No orders of the defined types were placed in this encounter.   Signed, Fransico Him, MD  02/20/2017 10:35 AM    [place]

## 2017-02-21 LAB — BASIC METABOLIC PANEL
BUN / CREAT RATIO: 18 (ref 12–28)
BUN: 26 mg/dL (ref 8–27)
CALCIUM: 10 mg/dL (ref 8.7–10.3)
CHLORIDE: 106 mmol/L (ref 96–106)
CO2: 23 mmol/L (ref 20–29)
Creatinine, Ser: 1.45 mg/dL — ABNORMAL HIGH (ref 0.57–1.00)
GFR calc non Af Amer: 34 mL/min/{1.73_m2} — ABNORMAL LOW (ref >59)
GFR, EST AFRICAN AMERICAN: 40 mL/min/{1.73_m2} — AB (ref >59)
GLUCOSE: 93 mg/dL (ref 65–99)
POTASSIUM: 4 mmol/L (ref 3.5–5.2)
Sodium: 142 mmol/L (ref 134–144)

## 2017-02-21 LAB — CBC
Hematocrit: 29.3 % — ABNORMAL LOW (ref 34.0–46.6)
Hemoglobin: 9.3 g/dL — ABNORMAL LOW (ref 11.1–15.9)
MCH: 26.1 pg — ABNORMAL LOW (ref 26.6–33.0)
MCHC: 31.7 g/dL (ref 31.5–35.7)
MCV: 82 fL (ref 79–97)
PLATELETS: 297 10*3/uL (ref 150–379)
RBC: 3.57 x10E6/uL — AB (ref 3.77–5.28)
RDW: 16.6 % — AB (ref 12.3–15.4)
WBC: 4.1 10*3/uL (ref 3.4–10.8)

## 2017-02-24 IMAGING — CT CT HEAD W/O CM
2 series · 16 of 30 positions shown, 20 images · non-contrast
Comparison: 01/24/2015 head CT.

CLINICAL DATA: TIA status post tPA.

EXAM:
CT HEAD WITHOUT CONTRAST
TECHNIQUE: Contiguous axial images were obtained from the base of the skull
through the vertex without intravenous contrast.

[Series 201: head w/o, idose (1) · axial · non-contrast · 0.49mm/px · z∈[-475,-345]mm · 13 of 32 slices shown, 17 images]
[im 3/32  brain]
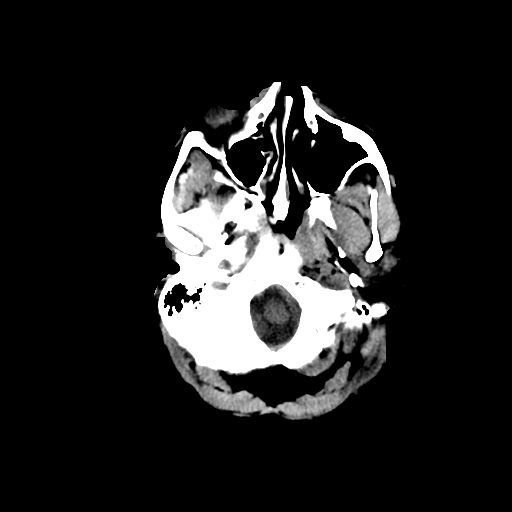
[im 3/32  bone]
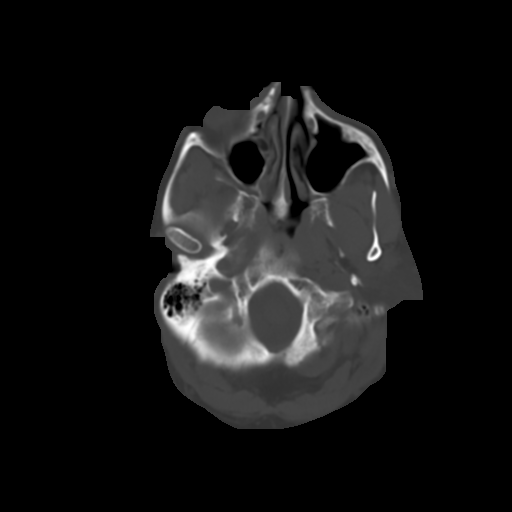
[im 5/32  brain]
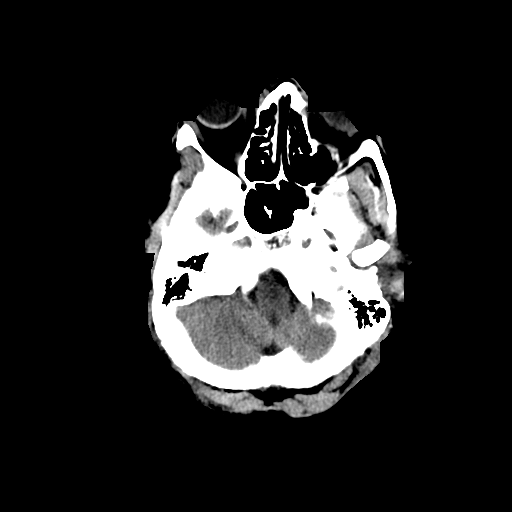
[im 7/32  brain]
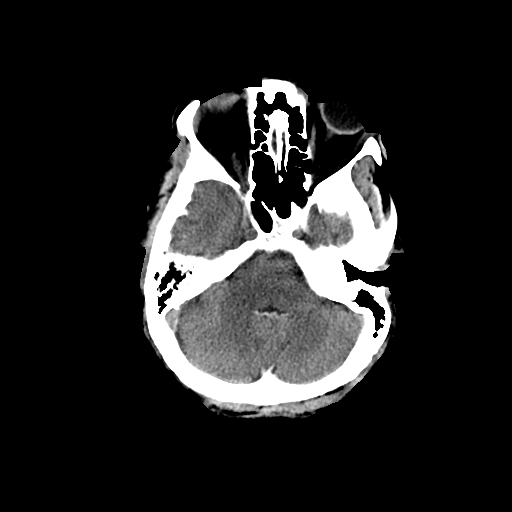
[im 9/32  brain]
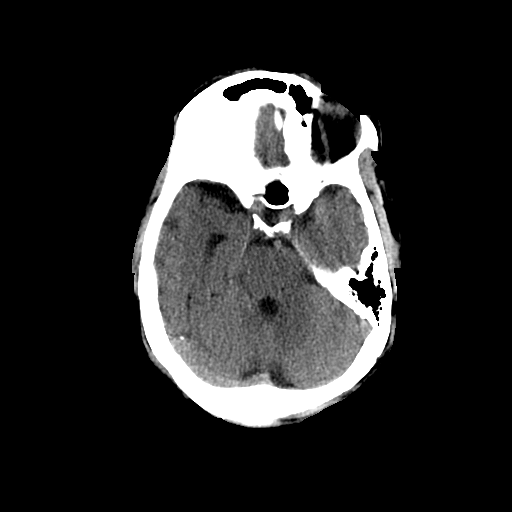
[im 12/32  brain]
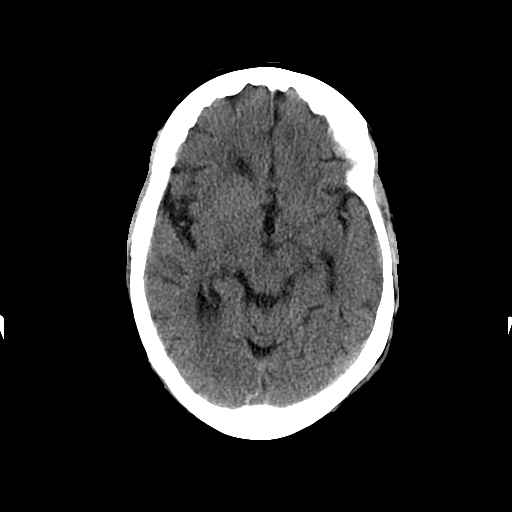
[im 12/32  bone]
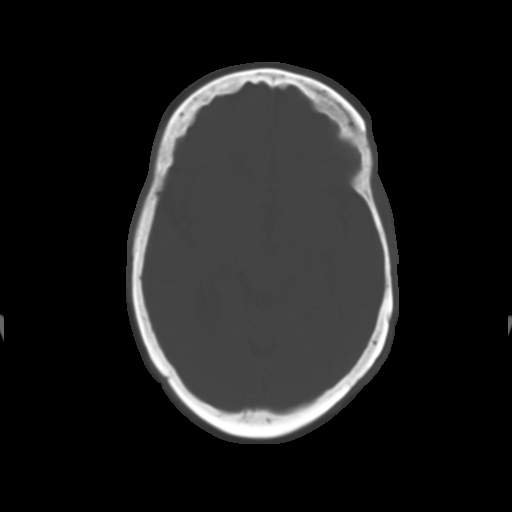
[im 14/32  brain]
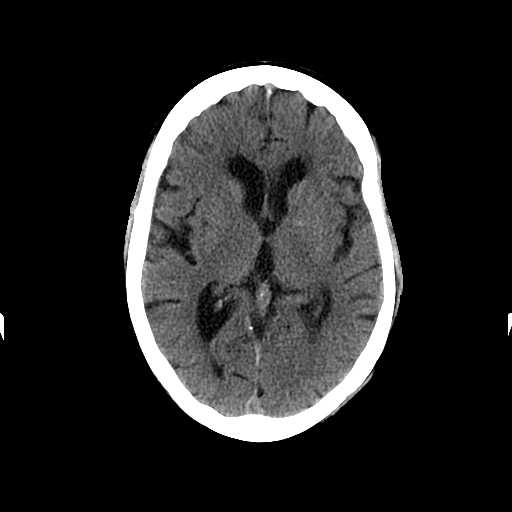
[im 16/32  brain]
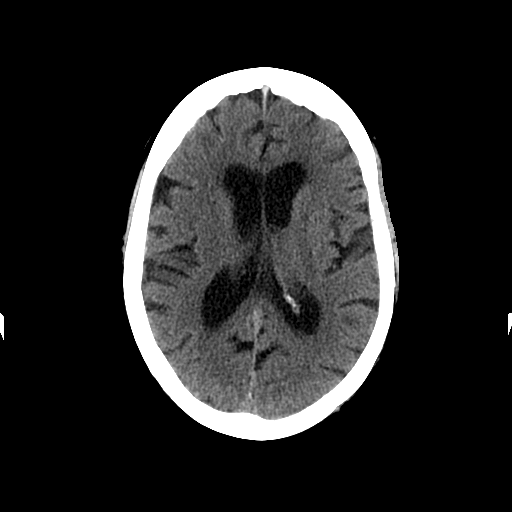
[im 18/32  brain]
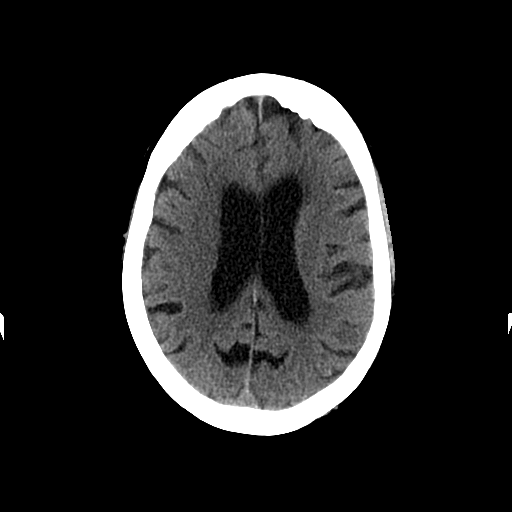
[im 20/32  brain]
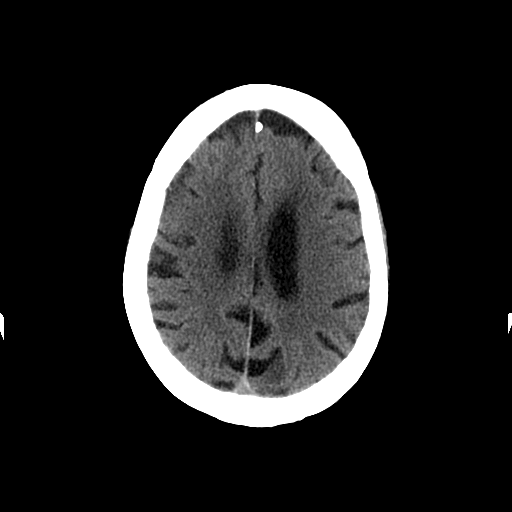
[im 20/32  bone]
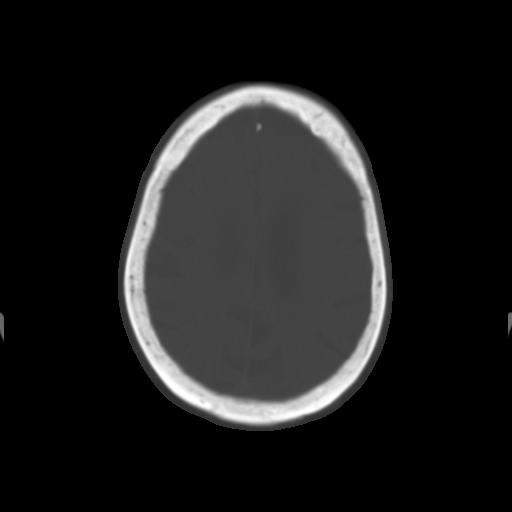
[im 23/32  brain]
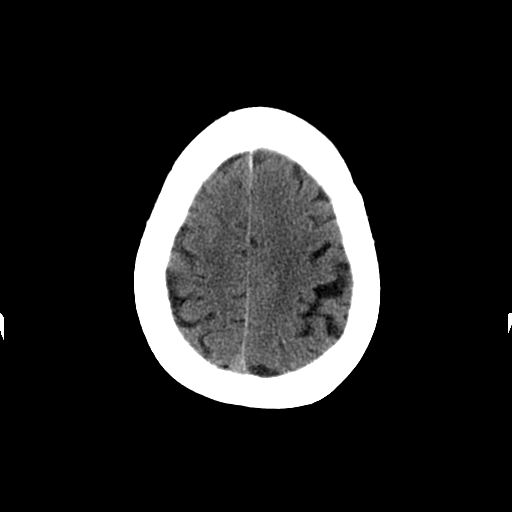
[im 25/32  brain]
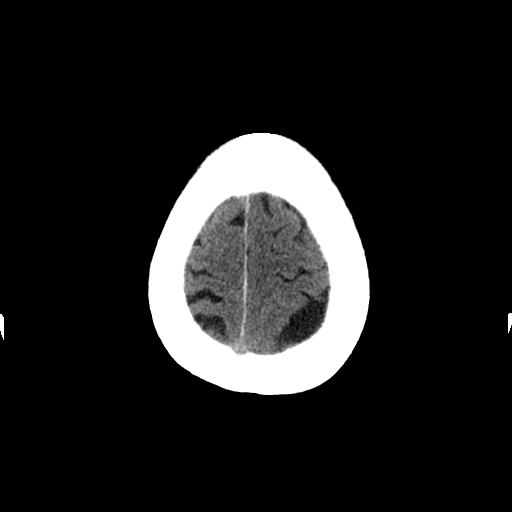
[im 27/32  brain]
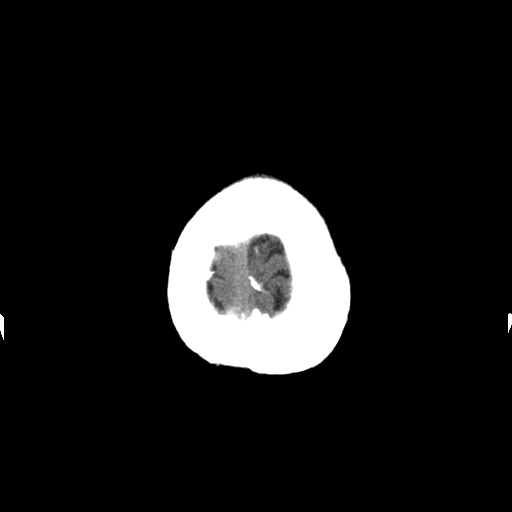
[im 29/32  brain]
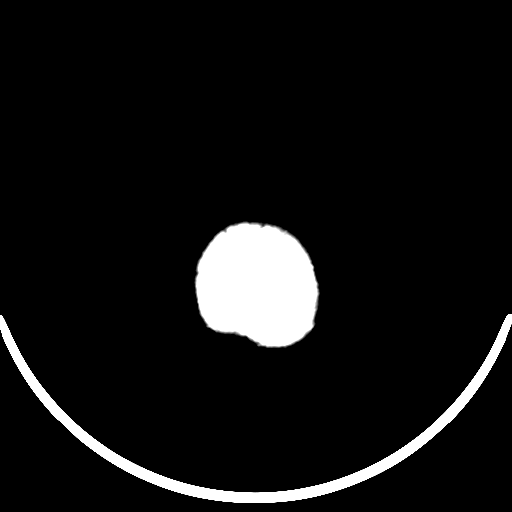
[im 29/32  bone]
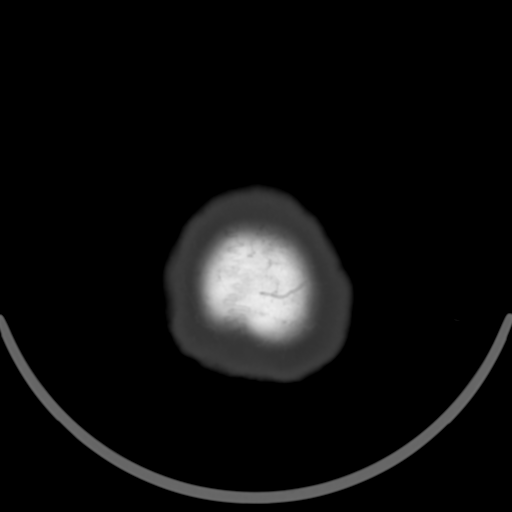

[Series 202: head w/o bone, idose (1) · axial · non-contrast · 0.49mm/px · z∈[-475,-430]mm · 3 of 32 slices shown]
[im 3/32  bone]
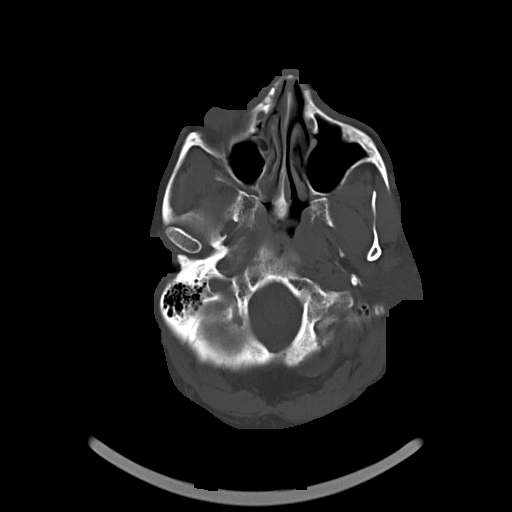
[im 7/32  bone]
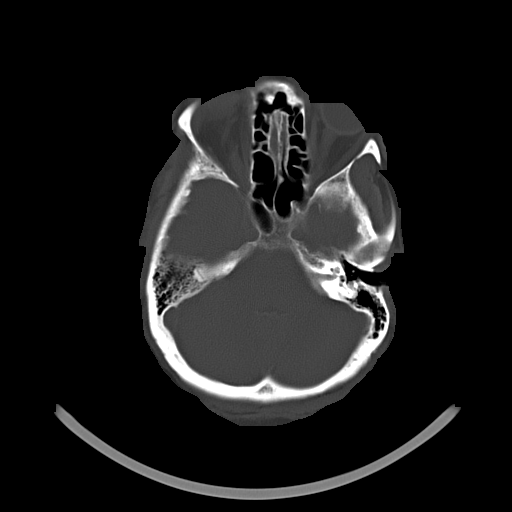
[im 12/32  bone]
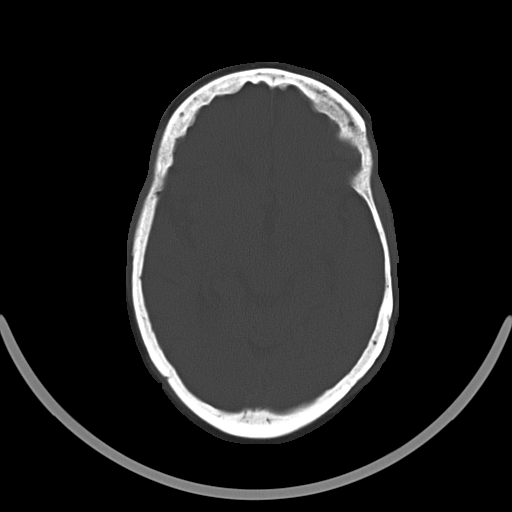

[16 of 30 positions shown; findings below may reference images not displayed]

FINDINGS: No evidence of parenchymal hemorrhage or extra-axial fluid
collection. No mass lesion, mass effect, or midline shift.

No CT evidence of acute infarction. Mild intracranial
atherosclerosis. Nonspecific stable subcortical and periventricular
white matter hypodensity, most in keeping with chronic small vessel
ischemic change.

Mild diffuse cerebral volume loss. No ventriculomegaly.

The visualized paranasal sinuses are essentially clear. The mastoid
air cells are unopacified. No evidence of calvarial fracture.
IMPRESSION: 1.  No evidence of acute intracranial abnormality.
2. Mild cerebral volume loss and mild chronic small vessel ischemic
white matter change.

## 2017-02-24 IMAGING — DX DG CHEST 2V
2 series · 2 of 2 positions shown · non-contrast
Comparison: Chest radiograph March 05, 2013; chest CT March 05, 2013

CLINICAL DATA: Generalized weakness. Recent transient ischemic
attack

EXAM:
CHEST  2 VIEW

[chest pa]
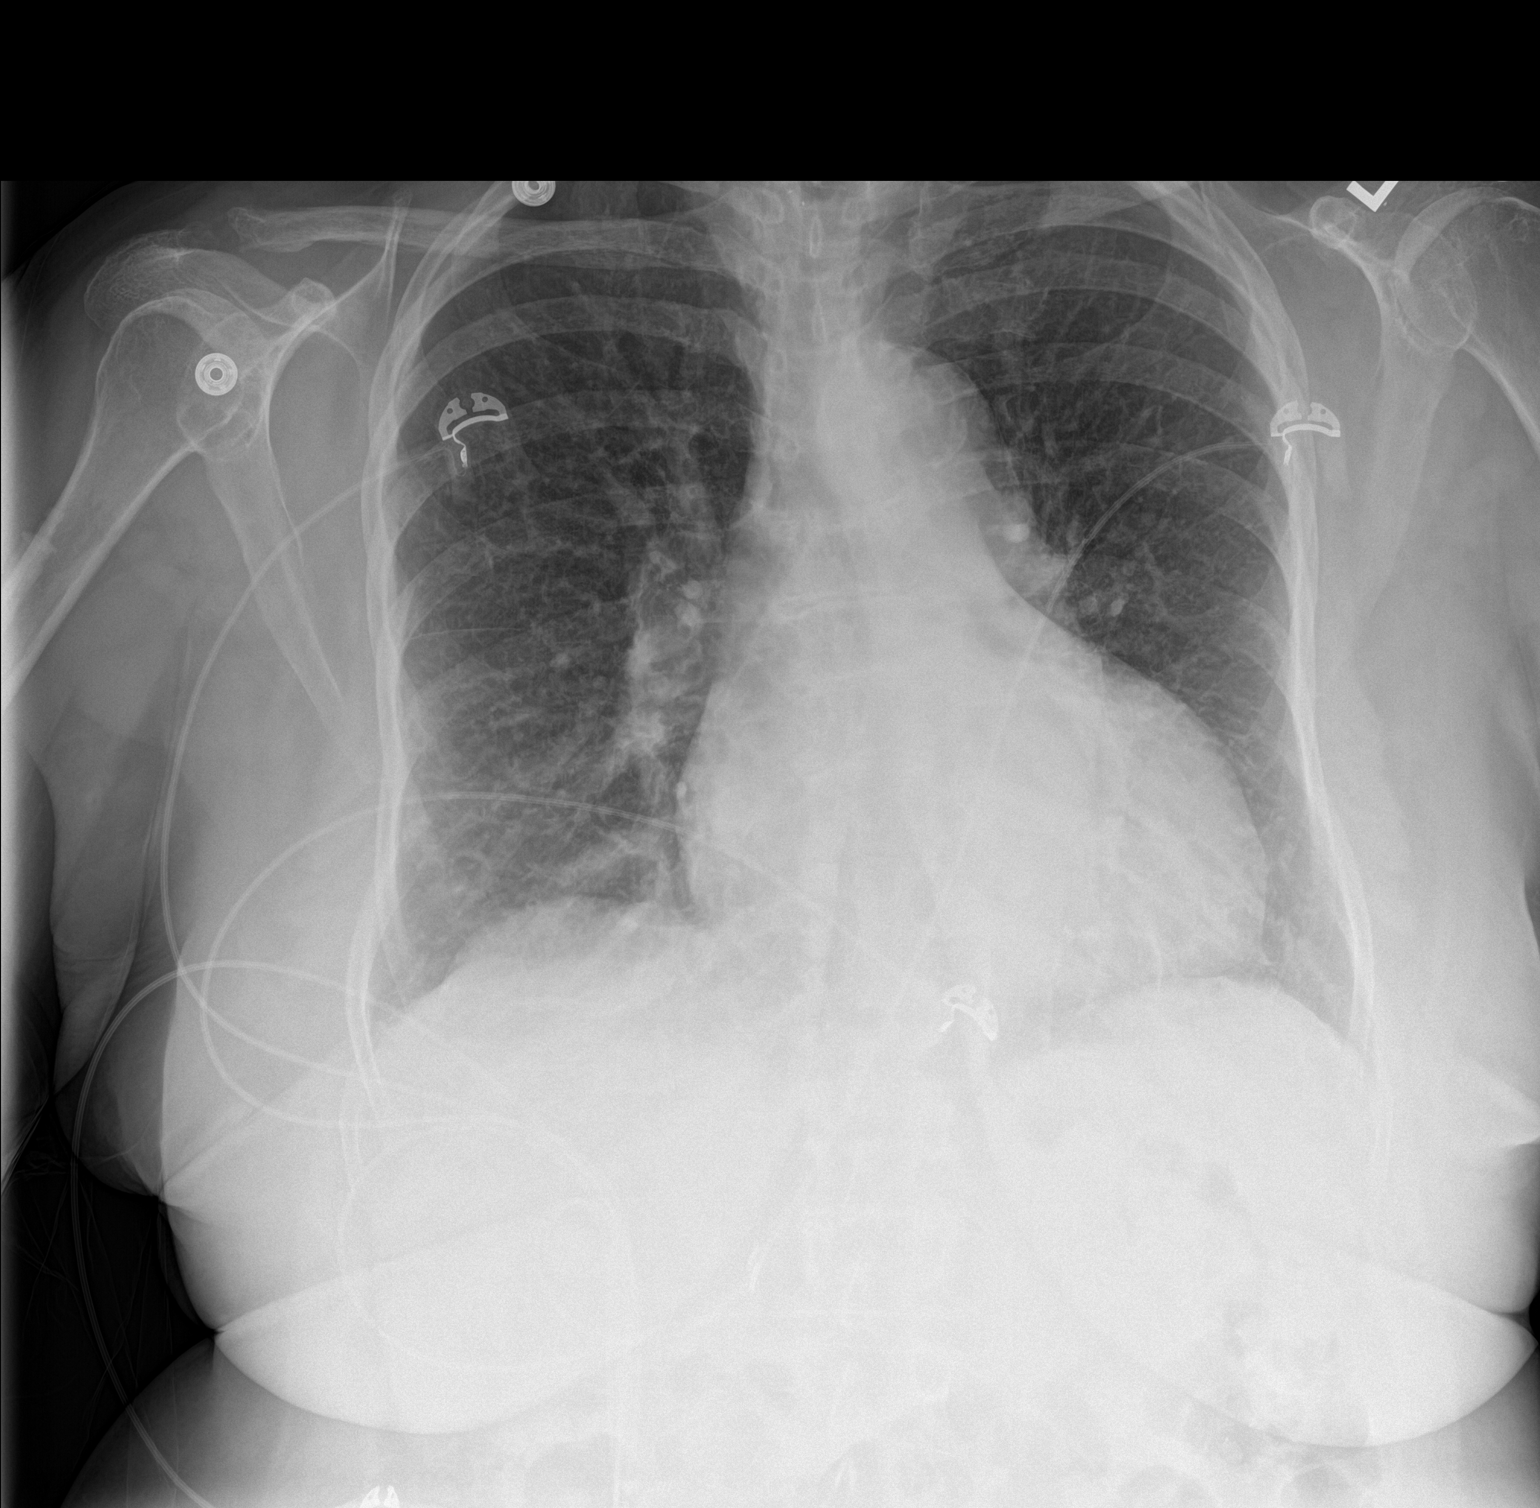

[chest lat]
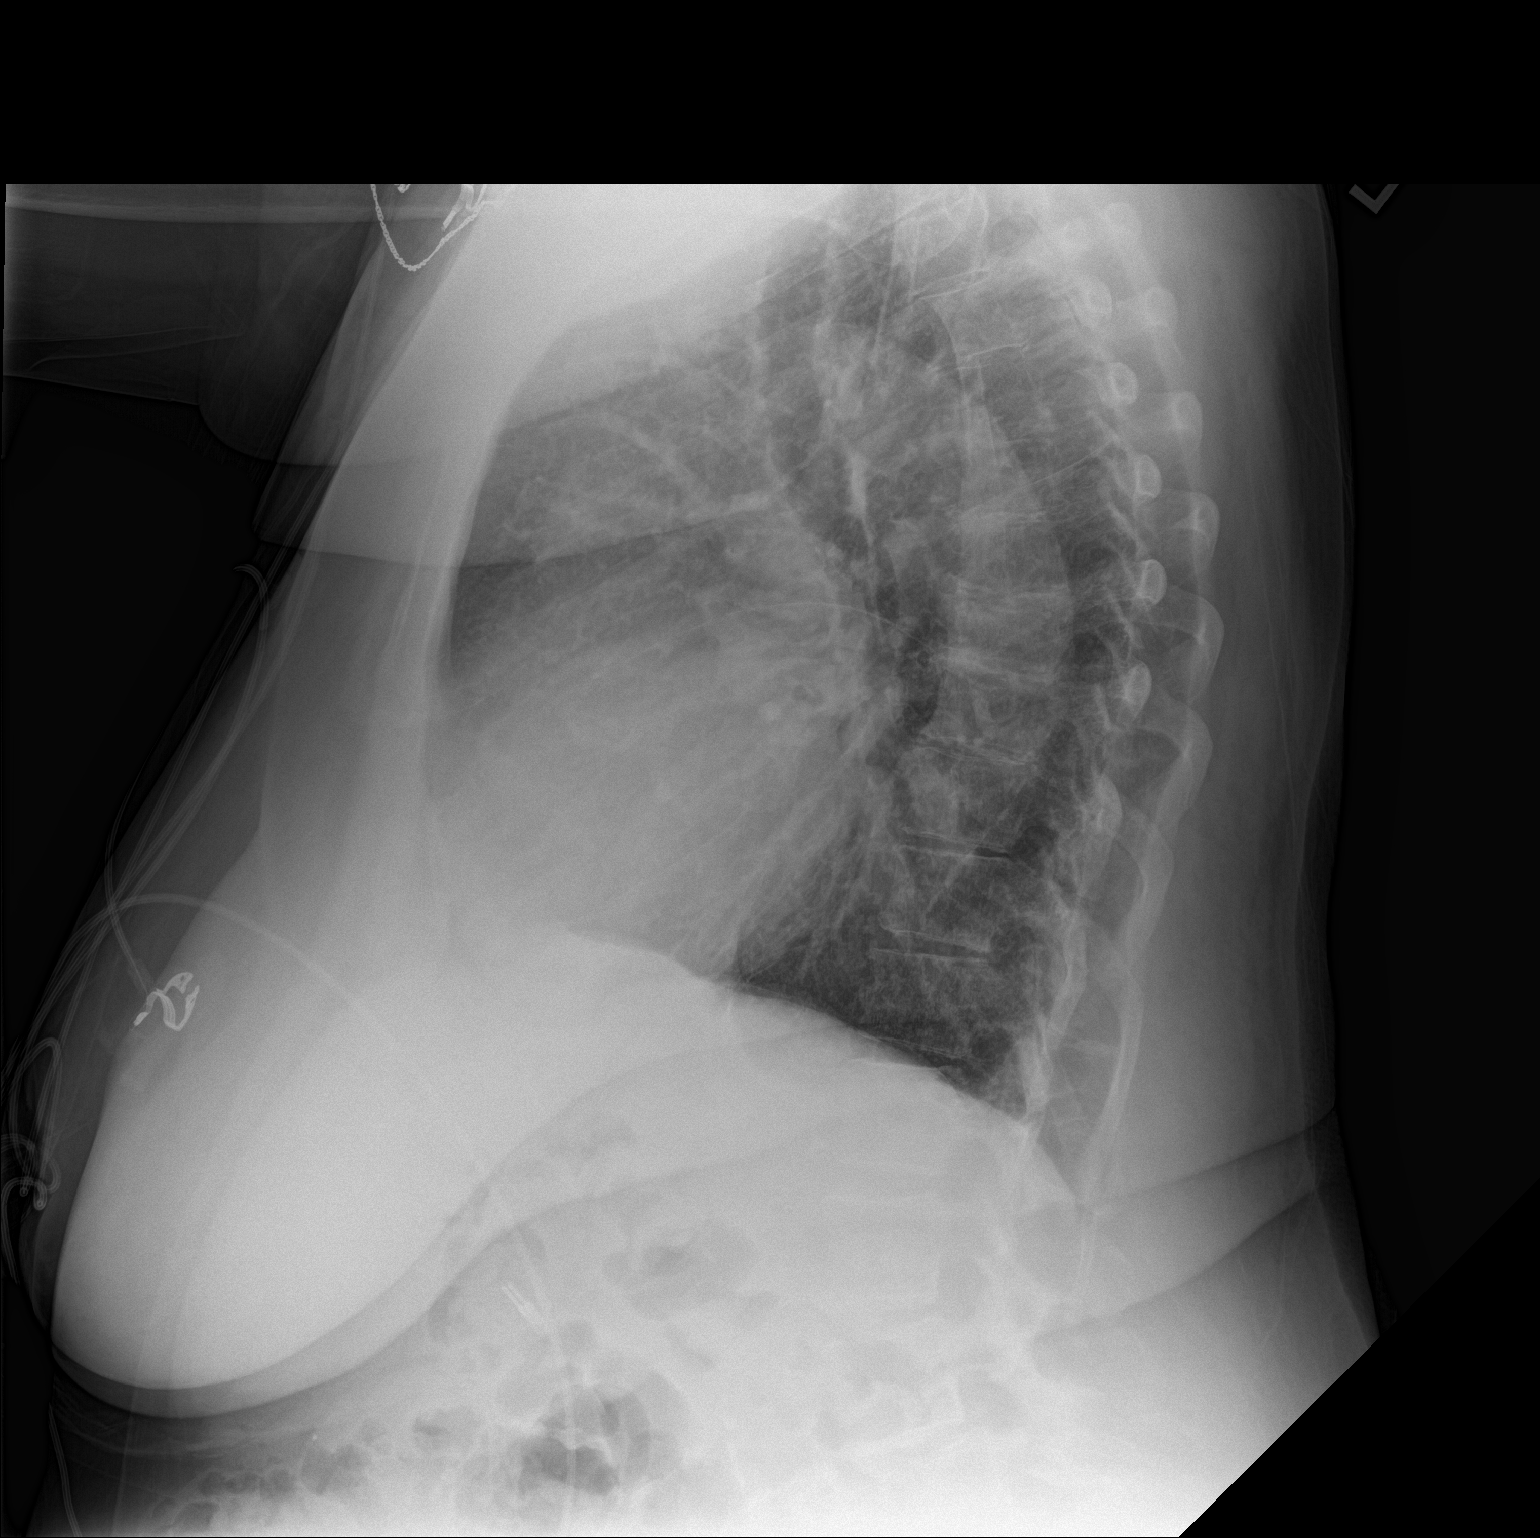

[2 of 2 positions shown; findings below may reference images not displayed]

FINDINGS: Mild generalized interstitial prominence is stable. There is no
frank edema or consolidation. There is slight cardiomegaly with
slight pulmonary venous hypertension. No adenopathy apparent. There
is degenerative change in the thoracic spine.
IMPRESSION: Findings consistent with a degree of pulmonary vascular congestion
without frank edema or consolidation. No change in cardiac
silhouette.

## 2017-02-26 ENCOUNTER — Other Ambulatory Visit: Payer: PPO | Admitting: *Deleted

## 2017-02-26 DIAGNOSIS — Z79899 Other long term (current) drug therapy: Secondary | ICD-10-CM

## 2017-02-26 LAB — BASIC METABOLIC PANEL
BUN/Creatinine Ratio: 14 (ref 12–28)
BUN: 20 mg/dL (ref 8–27)
CO2: 22 mmol/L (ref 20–29)
Calcium: 10.1 mg/dL (ref 8.7–10.3)
Chloride: 107 mmol/L — ABNORMAL HIGH (ref 96–106)
Creatinine, Ser: 1.48 mg/dL — ABNORMAL HIGH (ref 0.57–1.00)
GFR calc Af Amer: 39 mL/min/{1.73_m2} — ABNORMAL LOW (ref >59)
GFR calc non Af Amer: 33 mL/min/{1.73_m2} — ABNORMAL LOW (ref >59)
Glucose: 108 mg/dL — ABNORMAL HIGH (ref 65–99)
Potassium: 3.9 mmol/L (ref 3.5–5.2)
Sodium: 143 mmol/L (ref 134–144)

## 2017-02-28 ENCOUNTER — Telehealth: Payer: Self-pay | Admitting: Cardiology

## 2017-02-28 NOTE — Telephone Encounter (Signed)
Patient called back to confirm that she is taking lasix 20 mg every other day. Patient's phone disconnected during the call

## 2017-02-28 NOTE — Telephone Encounter (Signed)
Susan [name] is returning a call. Thanks

## 2017-02-28 NOTE — Telephone Encounter (Signed)
Patient calling, states that she is calling to report her progress with her Furosemide medication.

## 2017-02-28 NOTE — Telephone Encounter (Signed)
Left message to call back  

## 2017-03-03 NOTE — Telephone Encounter (Signed)
Spoke with patient. She stated that the pharmacy confirmed that she has lasix 20 mg tablets and she is taking lasix 20 mg every other day. Patient thankful for the call

## 2017-03-06 DIAGNOSIS — R21 Rash and other nonspecific skin eruption: Secondary | ICD-10-CM | POA: Diagnosis not present

## 2017-03-06 DIAGNOSIS — M150 Primary generalized (osteo)arthritis: Secondary | ICD-10-CM | POA: Diagnosis not present

## 2017-03-06 DIAGNOSIS — J069 Acute upper respiratory infection, unspecified: Secondary | ICD-10-CM | POA: Diagnosis not present

## 2017-03-06 DIAGNOSIS — J209 Acute bronchitis, unspecified: Secondary | ICD-10-CM | POA: Diagnosis not present

## 2017-03-06 DIAGNOSIS — R0789 Other chest pain: Secondary | ICD-10-CM | POA: Diagnosis not present

## 2017-03-06 DIAGNOSIS — D649 Anemia, unspecified: Secondary | ICD-10-CM | POA: Diagnosis not present

## 2017-03-20 ENCOUNTER — Telehealth: Payer: Self-pay | Admitting: Cardiology

## 2017-03-20 DIAGNOSIS — R1031 Right lower quadrant pain: Secondary | ICD-10-CM | POA: Diagnosis not present

## 2017-03-20 DIAGNOSIS — D509 Iron deficiency anemia, unspecified: Secondary | ICD-10-CM | POA: Diagnosis not present

## 2017-03-20 DIAGNOSIS — G4733 Obstructive sleep apnea (adult) (pediatric): Secondary | ICD-10-CM

## 2017-03-20 NOTE — Telephone Encounter (Signed)
New message       [facility] Pre-operative Risk Assessment    Request for surgical clearance:  1. What type of surgery is being performed? Colonscopy, upper endo  2. When is this surgery scheduled? 03/24/2017  3. What type of clearance is required (medical clearance vs. Pharmacy clearance to hold med vs. Both)? Pharmacy  4. Are there any medications that need to be held prior to surgery and how long? Elquis  5. Practice name and name of physician performing surgery? Dr Wilford Corner  6. What is your office phone and fax number? Fax [phone]  7. Anesthesia type (None, local, MAC, general) ? general   Laurier Nancy 03/20/2017, 10:46 AM  _________________________________________________________________   (provider comments below)

## 2017-03-21 NOTE — Telephone Encounter (Signed)
Returned call to [facility] @ [place] to let her know that we will take a look at the pt's clearance and send them the information back once it's completed. Left a detailed message.

## 2017-03-21 NOTE — Telephone Encounter (Signed)
Patient with diagnosis of Afib on Eliquis for anticoagulation.    Procedure: colonoscopy Date of procedure: 03/24/17  CHADS2-VASc score of  7 (CHF, HTN, AGE, DM2, stroke/tia x 2, CAD, AGE, female)  CrCl 11ml/min  Per office protocol, patient can hold Eliquis for 24 hours prior to procedure.  With history of stroke would recommend resume Eliquis as soon as safe after procedure.

## 2017-03-21 NOTE — Telephone Encounter (Signed)
Follow up    Susan [name] is calling back in reference to clearance. Patient had to be scheduled quickly due to a low hemogloblin. Please call.

## 2017-03-24 DIAGNOSIS — Z86010 Personal history of colonic polyps: Secondary | ICD-10-CM | POA: Diagnosis not present

## 2017-03-24 DIAGNOSIS — K5730 Diverticulosis of large intestine without perforation or abscess without bleeding: Secondary | ICD-10-CM | POA: Diagnosis not present

## 2017-03-24 DIAGNOSIS — K259 Gastric ulcer, unspecified as acute or chronic, without hemorrhage or perforation: Secondary | ICD-10-CM | POA: Diagnosis not present

## 2017-03-24 DIAGNOSIS — K2930 Chronic superficial gastritis without bleeding: Secondary | ICD-10-CM | POA: Diagnosis not present

## 2017-03-24 DIAGNOSIS — D509 Iron deficiency anemia, unspecified: Secondary | ICD-10-CM | POA: Diagnosis not present

## 2017-03-24 DIAGNOSIS — K640 First degree hemorrhoids: Secondary | ICD-10-CM | POA: Diagnosis not present

## 2017-03-24 DIAGNOSIS — K2900 Acute gastritis without bleeding: Secondary | ICD-10-CM | POA: Diagnosis not present

## 2017-03-28 DIAGNOSIS — K2930 Chronic superficial gastritis without bleeding: Secondary | ICD-10-CM | POA: Diagnosis not present

## 2017-03-31 ENCOUNTER — Telehealth: Payer: Self-pay | Admitting: Cardiology

## 2017-03-31 NOTE — Telephone Encounter (Signed)
PAP assistant sent a message to doctor Turner for a prescription for a new CPAP. Awaiting to hear back from doctor Turner.

## 2017-03-31 NOTE — Telephone Encounter (Signed)
Follow up    Patient called again and her sleep apnea machine is broken needs a prescription for another one

## 2017-03-31 NOTE — Telephone Encounter (Signed)
1) What problem are you experiencing?CpAp machine broken    2) Who is your medical equipment company? [employer]   Please route to the sleep study assistant.

## 2017-04-01 ENCOUNTER — Telehealth: Payer: Self-pay | Admitting: *Deleted

## 2017-04-01 NOTE — Telephone Encounter (Signed)
Please order ResMed CPAP at 10cm H2O with heated humidity and supplies

## 2017-04-01 NOTE — Telephone Encounter (Signed)
Order sent to [facility] via community message and fax at [phone].

## 2017-04-01 NOTE — Telephone Encounter (Signed)
    Please order ResMed CPAP at 10cm H2O with heated humidity and supplies

## 2017-04-01 NOTE — Telephone Encounter (Signed)
Order sent to [facility] via community message and faxed to [phone].

## 2017-04-01 NOTE — Telephone Encounter (Signed)
Order sent to [facility] via community message and faxed to [phone].

## 2017-04-13 ENCOUNTER — Other Ambulatory Visit: Payer: Self-pay

## 2017-04-13 ENCOUNTER — Encounter (HOSPITAL_BASED_OUTPATIENT_CLINIC_OR_DEPARTMENT_OTHER): Payer: Self-pay | Admitting: *Deleted

## 2017-04-13 ENCOUNTER — Emergency Department (HOSPITAL_BASED_OUTPATIENT_CLINIC_OR_DEPARTMENT_OTHER): Payer: PPO

## 2017-04-13 ENCOUNTER — Emergency Department (HOSPITAL_BASED_OUTPATIENT_CLINIC_OR_DEPARTMENT_OTHER)
Admission: EM | Admit: 2017-04-13 | Discharge: 2017-04-14 | Disposition: A | Discharge status: Home / Self Care | Payer: PPO | Attending: Emergency Medicine | Admitting: Emergency Medicine

## 2017-04-13 DIAGNOSIS — S90912A Unspecified superficial injury of left ankle, initial encounter: Secondary | ICD-10-CM | POA: Diagnosis not present

## 2017-04-13 DIAGNOSIS — Z91040 Latex allergy status: Secondary | ICD-10-CM | POA: Insufficient documentation

## 2017-04-13 DIAGNOSIS — Z87891 Personal history of nicotine dependence: Secondary | ICD-10-CM | POA: Diagnosis not present

## 2017-04-13 DIAGNOSIS — Y9289 Other specified places as the place of occurrence of the external cause: Secondary | ICD-10-CM | POA: Insufficient documentation

## 2017-04-13 DIAGNOSIS — I5032 Chronic diastolic (congestive) heart failure: Secondary | ICD-10-CM | POA: Insufficient documentation

## 2017-04-13 DIAGNOSIS — Z4800 Encounter for change or removal of nonsurgical wound dressing: Secondary | ICD-10-CM | POA: Diagnosis not present

## 2017-04-13 DIAGNOSIS — Y999 Unspecified external cause status: Secondary | ICD-10-CM | POA: Diagnosis not present

## 2017-04-13 DIAGNOSIS — X58XXXA Exposure to other specified factors, initial encounter: Secondary | ICD-10-CM | POA: Insufficient documentation

## 2017-04-13 DIAGNOSIS — Y9389 Activity, other specified: Secondary | ICD-10-CM | POA: Diagnosis not present

## 2017-04-13 DIAGNOSIS — R2242 Localized swelling, mass and lump, left lower limb: Secondary | ICD-10-CM | POA: Diagnosis not present

## 2017-04-13 DIAGNOSIS — I110 Hypertensive heart disease with heart failure: Secondary | ICD-10-CM | POA: Diagnosis not present

## 2017-04-13 DIAGNOSIS — Z7901 Long term (current) use of anticoagulants: Secondary | ICD-10-CM | POA: Diagnosis not present

## 2017-04-13 DIAGNOSIS — S91002A Unspecified open wound, left ankle, initial encounter: Secondary | ICD-10-CM | POA: Diagnosis not present

## 2017-04-13 DIAGNOSIS — Z5189 Encounter for other specified aftercare: Secondary | ICD-10-CM

## 2017-04-13 DIAGNOSIS — M7989 Other specified soft tissue disorders: Secondary | ICD-10-CM

## 2017-04-13 MED ORDER — ENOXAPARIN SODIUM 100 MG/ML ~~LOC~~ SOLN
1.0000 mg/kg | Freq: Once | SUBCUTANEOUS | Status: DC
  Filled 2017-04-13: qty 1

## 2017-04-13 NOTE — ED Provider Notes (Signed)
[facility] EMERGENCY DEPARTMENT Provider Note   CSN: [id number] Arrival date & time: 04/13/17  2010     History   Chief Complaint Chief Complaint  Patient presents with  . Wound Check    HPI Susan [name] is a 77 y.o. female.  HPI  This is a 77 year old female with a history of hypertension, hyperlipidemia, thyroid disease who presents with left lower extremity pain.  Patient reports she has had a wound on the medial aspect of her left ankle for proximally 1 month.  She has noted asymmetric left lower extremity swelling for greater than 1 month.  She is seen her primary care physician who prescribed her some sort of ointment.  She is unclear whether this antibiotic ointment.  She denies any injuries.  She is not a diabetic.  She denies any fevers.  She came in tonight because she had increasing swelling and pain.  She states when she is not touching it her pain is 0 out of 10.  Pain is worsened with touching but not ambulation.  Denies any history of blood clots or clotting disorders.  Denies any chest pain or shortness of breath, or fevers.  Past Medical History:  Diagnosis Date  . Anxiety   . Arthritis    "legs, back" (07/29/2012)  . Chronic diastolic (congestive) heart failure ([place])   . Chronic lower back pain   . Complication of anesthesia    "I have apnea" (07/29/2012)  . Dementia    per family  . Depression   . Family history of anesthesia complication    "some wake up during OR; some are hard to wake up; some both" (07/29/2012)  . GERD (gastroesophageal reflux disease)   . History of stomach ulcers 1980's  . Hyperlipidemia   . Hypertension   . Incontinent of urine    wears pads  . OSA on CPAP   . Osteoarthritis of right knee   . Pedal edema   . Persistent atrial fibrillation (HCC)    CHADS2VASC score is 7 - on chronic anticoagulation with Apixaban  . Primary hyperparathyroidism ([place])   . Spinal stenosis   . Stroke ([place])   . Thyroid disease   .  Umbilical hernia    "unrepaired" (07/29/2012)  . Varicose veins    "BLE" (07/29/2012)    Patient Active Problem List   Diagnosis Date Noted  . Medication management 10/11/2016  . Hyperlipemia 06/18/2016  . Gait abnormality 06/18/2016  . Major depressive disorder, recurrent episode, severe ([place]) 11/21/2015  . Edema 02/09/2015  . History of CVA (cerebrovascular accident) 02/01/2015  . 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 Chronic low back pain 01/28/2015  . Short-term memory loss 01/28/2015  . Volume depletion 01/28/2015  . Persistent atrial fibrillation ([place]) 01/26/2015  . DCM (dilated cardiomyopathy) ([place]) 01/26/2015  . TIA (transient ischemic attack) 01/24/2015  . Chronic diastolic (congestive) heart failure ([place])   . Obesity (BMI 30-39.9) 01/27/2013  . Constipation 09/02/2012  . H/O arthroscopy of right knee 08/06/2012  . Anxiety   . Depression   . GERD (gastroesophageal reflux disease)   . Hypertensive heart disease   . OSA (obstructive sleep apnea)   . Osteoarthritis of right knee   . Hyperparathyroidism, primary ([place]) 04/22/2012  . Chronic cough 10/02/2011    Past Surgical History:  Procedure Laterality Date  . CATARACT EXTRACTION    . CHOLECYSTECTOMY  ~ 2010  .  COLONOSCOPY    . IUD REMOVAL  1980's  . PARATHYROIDECTOMY N/A 06/16/2012   Procedure: NECK EXPLORATION AND LEFT SUPERIOR PARATHYROIDECTOMY;  Surgeon: Earnstine Regal, MD;  Location: [facility];  Service: General;  Laterality: N/A;  . 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 TOTAL KNEE ARTHROPLASTY Right 07/29/2012   Procedure: TOTAL KNEE ARTHROPLASTY;  Surgeon: Ninetta Lights, MD;  Location: [facility];  Service: Orthopedics;  Laterality: Right;     OB History   None      Home Medications    Prior to Admission medications   Medication Sig Start Date End Date Taking? Authorizing Provider  apixaban (ELIQUIS) 5 MG TABS tablet  Take 1 tablet (5 mg total) by mouth every 12 (twelve) hours. 01/26/15  Yes Rai, Ripudeep K, MD  carvedilol (COREG) 12.5 MG tablet Take 1 tablet (12.5 mg total) by mouth 2 (two) times daily with a meal. 07/26/16 04/13/17 Yes Turner, Eber Hong, MD  furosemide (LASIX) 20 MG tablet Take 1 tablet by mouth in the morning and 1 tablet in the afternoon. Patient taking differently: Take 20 mg every other day 10/11/16  Yes Turner, Eber Hong, MD  acetaminophen (TYLENOL) 500 MG tablet Take 500 mg by mouth every 6 (six) hours as needed for moderate pain.    [provider]  buPROPion (WELLBUTRIN XL) 150 MG 24 hr tablet Take 450 mg by mouth daily.    [provider]  cholecalciferol (VITAMIN D) 1000 units tablet Take by mouth.    [provider]  gabapentin (NEURONTIN) 300 MG capsule Take 300 mg by mouth 2 (two) times daily.     [provider]  hydrALAZINE (APRESOLINE) 50 MG tablet Take 1 tablet (50 mg total) by mouth 2 (two) times daily. 07/03/16 10/01/16  Sueanne Margarita, MD  losartan (COZAAR) 50 MG tablet Take 1 tablet (50 mg total) by mouth daily. 08/22/16 09/01/16  Sueanne Margarita, MD  Multiple Vitamin (MULTIVITAMIN WITH MINERALS) TABS Take 1 tablet by mouth daily.    [provider]  pravastatin (PRAVACHOL) 40 MG tablet Take 40 mg by mouth daily.  11/01/15   [provider]  sertraline (ZOLOFT) 25 MG tablet Take 12.5 mg by mouth 2 (two) times daily.  06/14/16   [provider]  vitamin B-12 (CYANOCOBALAMIN) 100 MCG tablet Take by mouth.    [provider]    Family History Family History  Problem Relation Age of Onset  . Hypertension Mother        deceased  . Stroke Mother   . Lung cancer Father        deceased    Social History Social History   Tobacco Use  . Smoking status: Former Smoker    Packs/day: 1.00    Years: 30.00    Pack years: 30.00    Types: Cigarettes    Last attempt to quit: 01/07/1978    Years since quitting: 39.2  .  Smokeless tobacco: Never Used  Substance Use Topics  . Alcohol use: No  . Drug use: No     Allergies   Ace inhibitors; Lipitor [atorvastatin]; Simvastatin; and Latex   Review of Systems Review of Systems  Constitutional: Negative for fever.  Respiratory: Negative for shortness of breath.   Cardiovascular: Positive for leg swelling. Negative for chest pain.  Genitourinary: Negative for dysuria.  Skin: Positive for wound.  All other systems reviewed and are negative.    Physical Exam Updated Vital Signs BP 128/68 (BP Location:  Right Arm)   Pulse 64   Temp 98.8 F (37.1 C) (Oral)   Resp 18   Ht 5\' 1"  (1.549 m)   Wt 83.9 kg (185 lb)   SpO2 100%   BMI 34.96 kg/m   Physical Exam  Constitutional: She is oriented to person, place, and time. She appears well-developed and well-nourished.  Overweight, no acute distress  HENT:  Head: Normocephalic and atraumatic.  Cardiovascular: Normal rate, regular rhythm and normal heart sounds.  Pulmonary/Chest: Effort normal and breath sounds normal. No respiratory distress. She has no wheezes.  Musculoskeletal:  Asymmetric swelling of the left lower extremity to the knee, tenderness to palpation of the posterior calf, 1+ DP pulse  Neurological: She is alert and oriented to person, place, and time.  Skin: Skin is warm and dry.  2 punctate wounds over the medial aspect of the left ankle, no obvious adjacent erythema, no drainage noted, wound bases are clean and margins are sharp  Psychiatric: She has a normal mood and affect.  Nursing note and vitals reviewed.    ED Treatments / Results  Labs (all labs ordered are listed, but only abnormal results are displayed) Labs Reviewed - No data to display  EKG None  Radiology Dg Ankle Complete Left  Result Date: 04/13/2017 CLINICAL DATA:  Open wound to the left ankle with increased pain tonight. EXAM: LEFT ANKLE COMPLETE - 3+ VIEW COMPARISON:  10/01/2006 FINDINGS: No evidence of acute  fracture or dislocation involving the left ankle. No focal bone lesion or bone destruction. Diffuse soft tissue swelling about the left ankle and the visualized left lower leg. Multiple soft tissue calcifications suggesting phleboliths or dystrophic calcification. Dermatomyositis could also have this appearance. No radiopaque foreign bodies. IMPRESSION: No acute bony abnormalities. Diffuse soft tissue swelling. Diffuse soft tissue calcifications may represent phleboliths, dystrophic calcification or possibly dermatomyositis. Electronically Signed   By: Lucienne Capers M.D.   On: 04/13/2017 23:44    Procedures Procedures (including critical care time)  Medications Ordered in ED Medications  enoxaparin (LOVENOX) injection 85 mg (has no administration in time range)     Initial Impression / Assessment and Plan / ED Course  I have reviewed the triage vital signs and the nursing notes.  Pertinent labs & imaging results that were available during my care of the patient were reviewed by me and considered in my medical decision making (see chart for details).     Patient presents with concerns of a nonhealing wound and worsening pain and swelling.  She is nontoxic.  Vital signs are reassuring.  She does have tenderness to palpation and asymmetric swelling of the left lower extremity.  No obvious cellulitis; however, the patient is dark skinned.  Considerations include cellulitis, DVT, wound infection.  X-ray showed no evidence of osteomyelitis.  Ultrasound is unavailable at this time.  She is otherwise nontoxic and afebrile.  Will start on doxycycline to cover for infection and order an ultrasound for tomorrow.  She was given a dose of Lovenox.  Labs reviewed from 6 weeks ago with a normal platelet count.  After history, exam, and medical workup I feel the patient has been appropriately medically screened and is safe for discharge home. Pertinent diagnoses were discussed with the patient. Patient was  given return precautions.  Final Clinical Impressions(s) / ED Diagnoses   Final diagnoses:  Visit for wound check  Swelling of left lower extremity    ED Discharge Orders        Ordered  US Venous Img Lower Unilateral Left     04/13/17 2356       Merryl Hacker, MD 04/14/17 0001

## 2017-04-13 NOTE — Discharge Instructions (Addendum)
You were seen today for wound of the left lower leg.  There is no obvious infection; however, you will be placed on doxycycline to cover for infection or underlying cellulitis.  Given your leg swelling, you need to have an ultrasound to rule out blood clot.  This will be ordered for you you will need to return for this study.  If you develop any chest pain, shortness of breath, fevers or any new or worsening symptoms you should be reevaluated.

## 2017-04-13 NOTE — ED Triage Notes (Signed)
Pt has open wound to inner left ankle. Her PCP has prescribed a "salve". States pain is worse tonight. Open area noted, dry gauze dressing applied

## 2017-04-13 NOTE — ED Notes (Signed)
Patient stated that her left does not hurt unless you touch it.  It doesn't hurt when she walks.  Left lower extremity is swollen with positive pedal pulses.

## 2017-04-14 DIAGNOSIS — G4733 Obstructive sleep apnea (adult) (pediatric): Secondary | ICD-10-CM | POA: Diagnosis not present

## 2017-04-14 MED ORDER — DOXYCYCLINE HYCLATE 100 MG PO CAPS: 100.0000 mg | ORAL_CAPSULE | Freq: Two times a day (BID) | ORAL | 0 refills | Status: DC

## 2017-04-14 NOTE — ED Notes (Signed)
ED Provider at bedside. 

## 2017-04-15 NOTE — Telephone Encounter (Signed)
Patient has a 10 week follow up appointment scheduled for June 18 at 11 am 2019. Patient understands she needs to keep this appointment for insurance compliance. Patient was grateful for the call and thanked me.

## 2017-04-21 ENCOUNTER — Other Ambulatory Visit (HOSPITAL_COMMUNITY): Payer: Self-pay | Admitting: Family Medicine

## 2017-04-21 DIAGNOSIS — R609 Edema, unspecified: Secondary | ICD-10-CM

## 2017-04-22 ENCOUNTER — Other Ambulatory Visit (HOSPITAL_BASED_OUTPATIENT_CLINIC_OR_DEPARTMENT_OTHER): Payer: Self-pay

## 2017-04-22 ENCOUNTER — Ambulatory Visit (HOSPITAL_COMMUNITY)
Admission: RE | Admit: 2017-04-22 | Discharge: 2017-04-22 | Disposition: A | Discharge status: Home / Self Care | Payer: PPO | Source: Ambulatory Visit | Attending: Family Medicine | Admitting: Family Medicine

## 2017-04-22 DIAGNOSIS — R609 Edema, unspecified: Secondary | ICD-10-CM | POA: Insufficient documentation

## 2017-04-22 NOTE — Progress Notes (Signed)
*  Preliminary Results* Left lower extremity venous duplex completed. Left lower extremity is negative for deep vein thrombosis. There is no evidence of left Baker's cyst.  04/22/2017 10:28 AM  Maudry Mayhew, BS, RVT, RDCS, RDMS

## 2017-04-29 DIAGNOSIS — D509 Iron deficiency anemia, unspecified: Secondary | ICD-10-CM | POA: Diagnosis not present

## 2017-05-06 DIAGNOSIS — E7800 Pure hypercholesterolemia, unspecified: Secondary | ICD-10-CM | POA: Diagnosis not present

## 2017-05-06 DIAGNOSIS — I639 Cerebral infarction, unspecified: Secondary | ICD-10-CM | POA: Diagnosis not present

## 2017-05-06 DIAGNOSIS — F322 Major depressive disorder, single episode, severe without psychotic features: Secondary | ICD-10-CM | POA: Diagnosis not present

## 2017-05-06 DIAGNOSIS — I5032 Chronic diastolic (congestive) heart failure: Secondary | ICD-10-CM | POA: Diagnosis not present

## 2017-05-06 DIAGNOSIS — I878 Other specified disorders of veins: Secondary | ICD-10-CM | POA: Diagnosis not present

## 2017-05-06 DIAGNOSIS — I6930 Unspecified sequelae of cerebral infarction: Secondary | ICD-10-CM | POA: Diagnosis not present

## 2017-05-06 DIAGNOSIS — K259 Gastric ulcer, unspecified as acute or chronic, without hemorrhage or perforation: Secondary | ICD-10-CM | POA: Diagnosis not present

## 2017-05-06 DIAGNOSIS — D509 Iron deficiency anemia, unspecified: Secondary | ICD-10-CM | POA: Diagnosis not present

## 2017-05-06 DIAGNOSIS — G629 Polyneuropathy, unspecified: Secondary | ICD-10-CM | POA: Diagnosis not present

## 2017-05-06 DIAGNOSIS — I10 Essential (primary) hypertension: Secondary | ICD-10-CM | POA: Diagnosis not present

## 2017-05-09 DIAGNOSIS — I5032 Chronic diastolic (congestive) heart failure: Secondary | ICD-10-CM | POA: Diagnosis not present

## 2017-05-09 DIAGNOSIS — I481 Persistent atrial fibrillation: Secondary | ICD-10-CM | POA: Diagnosis not present

## 2017-05-09 DIAGNOSIS — S91002D Unspecified open wound, left ankle, subsequent encounter: Secondary | ICD-10-CM | POA: Diagnosis not present

## 2017-05-09 DIAGNOSIS — I110 Hypertensive heart disease with heart failure: Secondary | ICD-10-CM | POA: Diagnosis not present

## 2017-05-09 DIAGNOSIS — I69351 Hemiplegia and hemiparesis following cerebral infarction affecting right dominant side: Secondary | ICD-10-CM | POA: Diagnosis not present

## 2017-05-09 DIAGNOSIS — I10 Essential (primary) hypertension: Secondary | ICD-10-CM | POA: Diagnosis not present

## 2017-05-13 ENCOUNTER — Telehealth: Payer: Self-pay

## 2017-05-13 NOTE — Telephone Encounter (Signed)
Left message on voicemail for patient to return call when available , reason for call: check status of new patient packet. Patient with pending appointment on Friday 05/16/17

## 2017-05-14 NOTE — Telephone Encounter (Signed)
Per Rodena Piety, Clinical Intake Assistant patient returned call yesterday, left message on voicemail indicating she was returning my call.  I called patient this am and left message requesting return call, I indicated if I am unavailable she needs to leave a detailed message indicating if she has completed new patient packet and if not we can reschedule her appointment if needed to allow more time.

## 2017-05-15 NOTE — Telephone Encounter (Signed)
Packet received

## 2017-05-16 ENCOUNTER — Encounter: Payer: Self-pay | Admitting: Internal Medicine

## 2017-05-16 ENCOUNTER — Ambulatory Visit: Payer: PPO | Admitting: Internal Medicine

## 2017-05-16 VITALS — BP 126/78 | HR 52 | Temp 98.0°F | Ht 62.0 in | Wt 178.0 lb

## 2017-05-16 DIAGNOSIS — I481 Persistent atrial fibrillation: Secondary | ICD-10-CM | POA: Diagnosis not present

## 2017-05-16 DIAGNOSIS — F332 Major depressive disorder, recurrent severe without psychotic features: Secondary | ICD-10-CM | POA: Diagnosis not present

## 2017-05-16 DIAGNOSIS — Z79899 Other long term (current) drug therapy: Secondary | ICD-10-CM | POA: Diagnosis not present

## 2017-05-16 DIAGNOSIS — S81802A Unspecified open wound, left lower leg, initial encounter: Secondary | ICD-10-CM

## 2017-05-16 DIAGNOSIS — Z8673 Personal history of transient ischemic attack (TIA), and cerebral infarction without residual deficits: Secondary | ICD-10-CM

## 2017-05-16 DIAGNOSIS — R413 Other amnesia: Secondary | ICD-10-CM | POA: Diagnosis not present

## 2017-05-16 DIAGNOSIS — I5032 Chronic diastolic (congestive) heart failure: Secondary | ICD-10-CM | POA: Diagnosis not present

## 2017-05-16 DIAGNOSIS — I69351 Hemiplegia and hemiparesis following cerebral infarction affecting right dominant side: Secondary | ICD-10-CM | POA: Diagnosis not present

## 2017-05-16 DIAGNOSIS — I4819 Other persistent atrial fibrillation: Secondary | ICD-10-CM

## 2017-05-16 NOTE — Patient Instructions (Signed)
Continue current medications as ordered  Follow up with cardiology as scheduled  Continue home health through [facility] - wound care as ordered  Get old records from [facility]  Follow up in 1-2 mos for EV/ECG/fasting labs (day of appt)/MMSE.

## 2017-05-16 NOTE — Progress Notes (Signed)
Patient ID: Susan [name], female   DOB: 01/01/1941, 77 y.o.   MRN: [id number]   Location:  [facility] OFFICE  Provider: DR Arletha Grippe  Code Status:  Goals of Care:  Advanced Directives 05/16/2017  Does Patient Have a Medical Advance Directive? Yes  Type of Advance Directive Living will  Does patient want to make changes to medical advance directive? No - Patient declined  Copy of [place] in Chart? -  Would patient like information on creating a medical advance directive? -  Pre-existing out of facility DNR order (yellow form or pink MOST form) -     Chief Complaint  Patient presents with  . Establish Care    New patient establish care, + fall risk, last fall was last Thursday, hit right shoulder.   . Medication Management    Patient did not have medications at new patient appointment and stated she does not currently have a pharmacy, in transition   . Advance Care Planning    Living will, copy requested     HPI: Patient is a 77 y.o. female seen today as a new patient. Previous PCP Dr Laurann Montana at [facility]. She gets meds in pill pack and did not bring her meds to ov today. She is a poor historian due to memory loss/dementia. Hx obtained from chart. Last AWV 11/05/16. She transports with [facility]  She is c/a left leg wound. She was seen in the ED 04/13/17 for it. LLE venous doppler US neg for DVT. She wears compression stockings  Chronic diastolic HF/persistent afib/HTN - followed by cardio Dr Radford Pax. CHADs2VASC score 7. She takes eliquis for anticoagulation. 2D echo in 04/2015 showed EF 55-60% (prev30-35%).  OSA - she uses CPAP qHS but only uses it for 4 hrs with compliance of 12% per cardio Feb 2019 ov. Followed by cardio Dr Radford Pax  Anxiety/depression - she is on 3 meds. 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   Past Medical History:  Diagnosis Date  . Anxiety   .  Arthritis    "legs, back" (07/29/2012)  . Chronic diastolic (congestive) heart failure ([place])   . Chronic lower back pain   . Complication of anesthesia    "I have apnea" (07/29/2012)  . Dementia    per family  . Depression   . Family history of anesthesia complication    "some wake up during OR; some are hard to wake up; some both" (07/29/2012)  . GERD (gastroesophageal reflux disease)   . History of stomach ulcers 1980's  . Hyperlipidemia   . Hypertension   . Incontinent of urine    wears pads  . OSA on CPAP   . Osteoarthritis of right knee   . Pedal edema   . Persistent atrial fibrillation (HCC)    CHADS2VASC score is 7 - on chronic anticoagulation with Apixaban  . Primary hyperparathyroidism ([place])   . Spinal stenosis   . Stroke ([place])   . Thyroid disease   . Umbilical hernia    "unrepaired" (07/29/2012)  . Varicose veins    "BLE" (07/29/2012)    Past Surgical History:  Procedure Laterality Date  . CATARACT EXTRACTION    . CHOLECYSTECTOMY  ~ 2010  . COLONOSCOPY    . IUD REMOVAL  1980's  . PARATHYROIDECTOMY N/A 06/16/2012   Procedure: NECK EXPLORATION AND LEFT SUPERIOR PARATHYROIDECTOMY;  Surgeon: Earnstine Regal,  MD;  Location: [facility];  Service: General;  Laterality: N/A;  . 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 TOTAL KNEE ARTHROPLASTY Right 07/29/2012   Procedure: TOTAL KNEE ARTHROPLASTY;  Surgeon: Ninetta Lights, MD;  Location: [facility];  Service: Orthopedics;  Laterality: Right;     reports that she quit smoking about 39 years ago. Her smoking use included cigarettes. She has a 30.00 pack-year smoking history. She has never used smokeless tobacco. She reports that she does not drink alcohol or use drugs. Social History   Socioeconomic History  . Marital status: Widowed    Spouse name: Not on file  . Number of children: Not on file  . Years of education: Not on file  . Highest education level: Not on file  Occupational History  . Occupation: retired    Fish farm manager:  RETIRED  Social Needs  . Financial resource strain: Not on file  . Food insecurity:    Worry: Not on file    Inability: Not on file  . Transportation needs:    Medical: Not on file    Non-medical: Not on file  Tobacco Use  . Smoking status: Former Smoker    Packs/day: 1.00    Years: 30.00    Pack years: 30.00    Types: Cigarettes    Last attempt to quit: 01/07/1978    Years since quitting: 39.3  . Smokeless tobacco: Never Used  Substance and Sexual Activity  . Alcohol use: No  . Drug use: No  . Sexual activity: Never  Lifestyle  . Physical activity:    Days per week: Not on file    Minutes per session: Not on file  . Stress: Not on file  Relationships  . Social connections:    Talks on phone: Not on file    Gets together: Not on file    Attends religious service: Not on file    Active member of club or organization: Not on file    Attends meetings of clubs or organizations: Not on file    Relationship status: Not on file  . Intimate partner violence:    Fear of current or ex partner: Not on file    Emotionally abused: Not on file    Physically abused: Not on file    Forced sexual activity: Not on file  Other Topics Concern  . Not on file  Social History Narrative   Lives at home alone   Right-handed   Caffeine: 1 cup of coffee per day         Diet:      Caffeine:      Married, if yes what year:       Do you live in a house, apartment, assisted living, condo, trailer, ect:      Pets:      Current/Past profession:      Exercise:         Living Will: Yes   DNR: No   POA/HPOA: Yes      Functional Status:   Do you have difficulty bathing or dressing yourself? Yes   Do you have difficulty preparing food or eating? No   Do you have difficulty managing your medications? Yes   Do you have difficulty managing your finances? Yes   Do you have difficulty affording your medications? Yes    Family History  Problem Relation Age of Onset  . Hypertension  Mother        deceased  . Stroke Mother   .  Lung cancer Father        deceased  . Diabetes Daughter   . Hypertension Daughter     Allergies  Allergen Reactions  . Ace Inhibitors Cough and Other (See Comments)  . Lipitor [Atorvastatin] Swelling and Other (See Comments)  . Simvastatin Other (See Comments)    Myalgias     . Latex Itching    Outpatient Encounter Medications as of 05/16/2017  Medication Sig  . acetaminophen (TYLENOL) 500 MG tablet Take 500 mg by mouth every 6 (six) hours as needed for moderate pain.  Marland Kitchen apixaban (ELIQUIS) 5 MG TABS tablet Take 1 tablet (5 mg total) by mouth every 12 (twelve) hours.  Marland Kitchen buPROPion (WELLBUTRIN XL) 150 MG 24 hr tablet Take 450 mg by mouth daily.  . cholecalciferol (VITAMIN D) 1000 units tablet Take by mouth.  . furosemide (LASIX) 20 MG tablet Take 1 tablet by mouth in the morning and 1 tablet in the afternoon.  . gabapentin (NEURONTIN) 300 MG capsule Take 300 mg by mouth 2 (two) times daily.   . hydrALAZINE (APRESOLINE) 50 MG tablet Take 1 tablet (50 mg total) by mouth 2 (two) times daily.  Marland Kitchen losartan (COZAAR) 50 MG tablet Take 1 tablet (50 mg total) by mouth daily.  . Multiple Vitamin (MULTIVITAMIN WITH MINERALS) TABS Take 1 tablet by mouth daily.  . pravastatin (PRAVACHOL) 40 MG tablet Take 40 mg by mouth daily.   . sertraline (ZOLOFT) 25 MG tablet Take 12.5 mg by mouth 2 (two) times daily.   . vitamin B-12 (CYANOCOBALAMIN) 100 MCG tablet Take by mouth.  . carvedilol (COREG) 12.5 MG tablet Take 1 tablet (12.5 mg total) by mouth 2 (two) times daily with a meal.   No facility-administered encounter medications on file as of 05/16/2017.     Review of Systems:  Review of Systems  HENT: Positive for hearing loss.   Eyes: Positive for visual disturbance (wears glasses).  Respiratory: Positive for cough.   Gastrointestinal: Positive for constipation.       Hiatal hernia; gas  Genitourinary: Positive for frequency.       Urinary  incontinance  Musculoskeletal: Positive for arthralgias.  Skin: Positive for color change.  Allergic/Immunologic: Positive for environmental allergies.  Neurological: Positive for weakness.       Loss of balance  Hematological:       Petechiae  Psychiatric/Behavioral: Positive for agitation, confusion (memory loss) and dysphoric mood. The patient is nervous/anxious.        Stress; prior psych intervention  All other systems reviewed and are negative. taken from NP packet  Health Maintenance  Topic Date Due  . TETANUS/TDAP  01/21/1957  . DEXA SCAN  01/22/2003  . PNA vac Low Risk Adult (2 of 2 - PCV13) 01/07/2013  . INFLUENZA VACCINE  08/07/2017    Physical Exam: Vitals:   05/16/17 0813  BP: 126/78  Pulse: (!) 52  Temp: 98 F (36.7 C)  TempSrc: Oral  SpO2: 97%  Weight: 178 lb (80.7 kg)  Height: 5\' 2"  (1.575 m)   Body mass index is 32.56 kg/m. Physical Exam  Constitutional: She is oriented to person, place, and time. She appears well-developed and well-nourished.  HENT:  Mouth/Throat: Oropharynx is clear and moist. No oropharyngeal exudate.  MMM; no oral thrush  Eyes: Pupils are equal, round, and reactive to light. No scleral icterus.  Neck: Neck supple. Carotid bruit is not present. No tracheal deviation present. No thyromegaly present.  Cardiovascular: Regular rhythm and intact distal pulses.  Bradycardia present. Exam reveals no gallop and no friction rub.  Murmur (1/6 SEM) heard. +1 pitting LLE edema; trace RLE edema. Left calf TTP  Pulmonary/Chest: Effort normal and breath sounds normal. No stridor. No respiratory distress. She has no wheezes. She has no rales.  Abdominal: Soft. Normal appearance and bowel sounds are normal. She exhibits no distension and no mass. There is no hepatomegaly. There is no tenderness. There is no rigidity, no rebound and no guarding. No hernia.  Musculoskeletal: She exhibits edema.  Lymphadenopathy:    She has no cervical adenopathy.    Neurological: She is alert and oriented to person, place, and time. She has normal reflexes. Gait abnormal.  Skin: Skin is warm and dry. No rash noted.  LLE dsg c/d/i  Psychiatric: She has a normal mood and affect. Her behavior is normal. Thought content normal.    Labs reviewed: Basic Metabolic Panel: Recent Labs    09/10/16 1345 02/20/17 1101 02/26/17 0844  NA 143 142 143  K 4.2 4.0 3.9  CL 105 106 107*  CO2 25 23 22   GLUCOSE 83 93 108*  BUN 17 26 20   CREATININE 0.95 1.45* 1.48*  CALCIUM 10.8* 10.0 10.1   Liver Function Tests: No results for input(s): AST, ALT, ALKPHOS, BILITOT, PROT, ALBUMIN in the last 8760 hours. No results for input(s): LIPASE, AMYLASE in the last 8760 hours. No results for input(s): AMMONIA in the last 8760 hours. CBC: Recent Labs    02/20/17 1101  WBC 4.1  HGB 9.3*  HCT 29.3*  MCV 82  PLT 297   Lipid Panel: No results for input(s): CHOL, HDL, LDLCALC, TRIG, CHOLHDL, LDLDIRECT in the last 8760 hours. Lab Results  Component Value Date   HGBA1C 6.1 (H) 01/25/2015    Procedures since last visit: No results found.  Assessment/Plan   ICD-10-CM   1. Wound of left lower extremity, initial encounter S81.802A   2. 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 Severe episode of recurrent major depressive disorder, without psychotic features ([place]) F33.2   4. Short-term memory loss R41.3   5. History of CVA (cerebrovascular accident) Z86.73   6. Chronic diastolic (congestive) heart failure (HCC) I50.32   7. Persistent atrial fibrillation (HCC) I48.1   8. High risk medication use Z79.899    Wound to be followed by [facility] wound care   Continue current medications as ordered  Follow up with cardiology as scheduled  Continue home health through [facility] - wound care as ordered  Get old records from [facility]  Follow up in 1-2 mos for EV/ECG/fasting labs (day of appt)/MMSE.   Marijke Guadiana S. Perlie Gold  [facility] [address] [place], [place] [address] [phone] Cell (Monday-Friday 8 AM - 5 PM) [phone] After 5 PM and follow prompts

## 2017-05-19 ENCOUNTER — Encounter: Payer: Self-pay | Admitting: Internal Medicine

## 2017-05-22 ENCOUNTER — Telehealth: Payer: Self-pay | Admitting: Cardiology

## 2017-05-22 NOTE — Telephone Encounter (Signed)
Left message to call back  

## 2017-05-22 NOTE — Telephone Encounter (Signed)
New Message:       Pt c/o medication issue:  1. Name of Medication: losartan (COZAAR) 50 MG tablet  2. How are you currently taking this medication (dosage and times per day)? Take 1 tablet (50 mg total) by mouth daily.  3. Are you having a reaction (difficulty breathing--STAT)? No  4. What is your medication issue? Pt states she was told that this medication was recalled but pt states it is in her care package.

## 2017-05-23 ENCOUNTER — Telehealth: Payer: Self-pay | Admitting: *Deleted

## 2017-05-23 NOTE — Telephone Encounter (Signed)
-----   Message from Sueanne Margarita, MD sent at 05/17/2017  4:19 PM EDT ----- Good AHI on PAP but needs to improve compliance

## 2017-05-23 NOTE — Telephone Encounter (Signed)
Patient is aware and agreeable to AHI being within range at 1.6. Patient is aware and agreeable to improving in compliance with machine usage Left detailed message on voicemail  to call back with questions or questions.

## 2017-05-23 NOTE — Telephone Encounter (Signed)
Left message to call back  

## 2017-05-26 ENCOUNTER — Telehealth: Payer: Self-pay | Admitting: Cardiology

## 2017-05-26 DIAGNOSIS — I481 Persistent atrial fibrillation: Secondary | ICD-10-CM | POA: Diagnosis not present

## 2017-05-26 DIAGNOSIS — I69351 Hemiplegia and hemiparesis following cerebral infarction affecting right dominant side: Secondary | ICD-10-CM | POA: Diagnosis not present

## 2017-05-26 DIAGNOSIS — I5032 Chronic diastolic (congestive) heart failure: Secondary | ICD-10-CM | POA: Diagnosis not present

## 2017-05-26 DIAGNOSIS — I110 Hypertensive heart disease with heart failure: Secondary | ICD-10-CM | POA: Diagnosis not present

## 2017-05-26 DIAGNOSIS — S91002D Unspecified open wound, left ankle, subsequent encounter: Secondary | ICD-10-CM | POA: Diagnosis not present

## 2017-05-26 NOTE — Telephone Encounter (Signed)
Left message to call back  

## 2017-05-26 NOTE — Telephone Encounter (Signed)
New message   Patient calling to request order for compression socks with zipper.

## 2017-05-26 NOTE — Telephone Encounter (Signed)
    Patient has a 10 week follow up appointment scheduled for June 18 at 11 am 2019. Patient understands she needs to keep this appointment for insurance compliance. Patient was grateful for the call and thanked me.

## 2017-05-29 NOTE — Telephone Encounter (Signed)
Left message with patient explaining that compressions socks do not come with a zipper and to try local medical stores or f/u with primary MD for recommendations.

## 2017-05-29 NOTE — Telephone Encounter (Signed)
Left multiple message to call back. Will close encounter

## 2017-05-30 ENCOUNTER — Telehealth: Payer: Self-pay | Admitting: Cardiology

## 2017-05-30 NOTE — Telephone Encounter (Signed)
Informed patient of Rena's message: Left message with patient explaining that compressions socks do not come with a zipper and to try local medical stores or f/u with primary MD for recommendations.   Patient verbalized understanding and agreeable to plan.

## 2017-05-30 NOTE — Telephone Encounter (Signed)
Pt calling   Stating she is returning a call from [place] from yesterday. Please call pt.

## 2017-06-03 ENCOUNTER — Telehealth: Payer: Self-pay | Admitting: *Deleted

## 2017-06-03 NOTE — Telephone Encounter (Signed)
Patient called and left message on clinical intake and  stated that she has had some vaginal bleeding for a couple of days.   I tried calling patient back to schedule an appointment but had to [facility] to return call.

## 2017-06-04 DIAGNOSIS — I69351 Hemiplegia and hemiparesis following cerebral infarction affecting right dominant side: Secondary | ICD-10-CM | POA: Diagnosis not present

## 2017-06-04 DIAGNOSIS — I481 Persistent atrial fibrillation: Secondary | ICD-10-CM | POA: Diagnosis not present

## 2017-06-04 DIAGNOSIS — I110 Hypertensive heart disease with heart failure: Secondary | ICD-10-CM | POA: Diagnosis not present

## 2017-06-04 DIAGNOSIS — I5032 Chronic diastolic (congestive) heart failure: Secondary | ICD-10-CM | POA: Diagnosis not present

## 2017-06-04 DIAGNOSIS — S91002D Unspecified open wound, left ankle, subsequent encounter: Secondary | ICD-10-CM | POA: Diagnosis not present

## 2017-06-04 NOTE — Telephone Encounter (Signed)
Appointment scheduled with patient.  Patient could not come in today, scheduled for tomorrow. Aware to go to the ER if worsens or any other symptoms occur.

## 2017-06-05 ENCOUNTER — Encounter: Payer: Self-pay | Admitting: Nurse Practitioner

## 2017-06-05 ENCOUNTER — Ambulatory Visit (INDEPENDENT_AMBULATORY_CARE_PROVIDER_SITE_OTHER): Payer: PPO | Admitting: Nurse Practitioner

## 2017-06-05 VITALS — BP 138/74 | HR 60 | Temp 97.7°F | Resp 10 | Ht 62.0 in | Wt 182.0 lb

## 2017-06-05 DIAGNOSIS — N939 Abnormal uterine and vaginal bleeding, unspecified: Secondary | ICD-10-CM

## 2017-06-05 LAB — CBC WITH DIFFERENTIAL/PLATELET
BASOS PCT: 1.1 %
Basophils Absolute: 39 cells/uL (ref 0–200)
EOS ABS: 98 {cells}/uL (ref 15–500)
Eosinophils Relative: 2.8 %
HCT: 27.4 % — ABNORMAL LOW (ref 35.0–45.0)
Hemoglobin: 8.9 g/dL — ABNORMAL LOW (ref 11.7–15.5)
Lymphs Abs: 697 cells/uL — ABNORMAL LOW (ref 850–3900)
MCH: 27.7 pg (ref 27.0–33.0)
MCHC: 32.5 g/dL (ref 32.0–36.0)
MCV: 85.4 fL (ref 80.0–100.0)
MONOS PCT: 16.5 %
MPV: 10 fL (ref 7.5–12.5)
NEUTROS PCT: 59.7 %
Neutro Abs: 2090 cells/uL (ref 1500–7800)
PLATELETS: 224 10*3/uL (ref 140–400)
RBC: 3.21 10*6/uL — ABNORMAL LOW (ref 3.80–5.10)
RDW: 17.1 % — AB (ref 11.0–15.0)
TOTAL LYMPHOCYTE: 19.9 %
WBC: 3.5 10*3/uL — ABNORMAL LOW (ref 3.8–10.8)
WBCMIX: 578 {cells}/uL (ref 200–950)

## 2017-06-05 NOTE — Progress Notes (Signed)
Careteam: Patient Care Team: Gildardo Cranker, DO as PCP - General (Internal Medicine)  Advanced Directive information    Allergies  Allergen Reactions  . Ace Inhibitors Cough and Other (See Comments)  . Lipitor [Atorvastatin] Swelling and Other (See Comments)  . Simvastatin Other (See Comments)    Myalgias     . Latex Itching    Chief Complaint  Patient presents with  . Acute Visit    Patient noticed blood in toilet and on pad x 3days, blood is brownish/red resembeling when patient had a menstural. No blood right now. Last episode of blood was this am. Patient denies abdominal pain or discomfort. + Fall risk      HPI: Patient is a 77 y.o. female seen in the office today due to bleeding from vaginal area for 3 days.  Found about 1 tablespoon of blood in pad when she wakes up and having to change pad ~twice daily due to this. Also incontinent of urine. Has noted blood in the toilet after urination. Notes blood on toilet paper after she wipes as well.  No blood in stool.  No dizziness or lightheaded.  No abdominal pain or dysuria. No abnormal weight loss.   Review of Systems:  Review of Systems  Constitutional: Negative for fever, malaise/fatigue and weight loss.  Respiratory: Negative for shortness of breath.   Cardiovascular: Negative for chest pain and palpitations.  Gastrointestinal: Negative for abdominal pain, blood in stool, melena, nausea and vomiting.  Genitourinary: Negative for dysuria, frequency, hematuria and urgency.       Vaginal bleeding   Neurological: Negative for dizziness and headaches.    Past Medical History:  Diagnosis Date  . Anxiety   . Arthritis    "legs, back" (07/29/2012)  . Chronic diastolic (congestive) heart failure ([place])   . Chronic lower back pain   . Complication of anesthesia    "I have apnea" (07/29/2012)  . Dementia    per family  . Depression   . 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 Edema 02/09/2015  . Family  history of anesthesia complication    "some wake up during OR; some are hard to wake up; some both" (07/29/2012)  . Gait abnormality 06/18/2016  . GERD (gastroesophageal reflux disease)   . 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 ([place])   . History of CVA (cerebrovascular accident) 02/01/2015  . History of stomach ulcers 1980's  . Hyperlipemia 06/18/2016  . Hyperlipidemia   . Hyperparathyroidism, primary ([place]) 04/22/2012  . Hypertension   . Hypertensive heart disease   . Incontinent of urine    wears pads  . Medication management 10/11/2016  . OSA (obstructive sleep apnea)   . OSA on CPAP   . Osteoarthritis of right knee   . Pedal edema   . Persistent atrial fibrillation (HCC)    CHADS2VASC score is 7 - on chronic anticoagulation with Apixaban  . Persistent atrial fibrillation ([place]) 01/26/2015  . Primary hyperparathyroidism ([place])   . Spinal stenosis   . Stroke ([place])   . Thyroid disease   . TIA (transient ischemic attack) 01/24/2015  . Umbilical hernia    "unrepaired" (07/29/2012)  . Varicose veins    "BLE" (07/29/2012)   Past Surgical History:  Procedure Laterality Date  . CATARACT EXTRACTION    . CHOLECYSTECTOMY  ~ 2010  . COLONOSCOPY    . IUD REMOVAL  1980's  . PARATHYROIDECTOMY N/A 06/16/2012   Procedure: NECK EXPLORATION AND LEFT SUPERIOR  PARATHYROIDECTOMY;  Surgeon: Earnstine Regal, MD;  Location: [facility];  Service: General;  Laterality: N/A;  . 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 TOTAL KNEE ARTHROPLASTY Right 07/29/2012   Procedure: TOTAL KNEE ARTHROPLASTY;  Surgeon: Ninetta Lights, MD;  Location: [facility];  Service: Orthopedics;  Laterality: Right;   Social History:   reports that she quit smoking about 39 years ago. Her smoking use included cigarettes. She has a 30.00 pack-year smoking history. She has never used smokeless tobacco. She reports that she does not drink alcohol or use drugs.  Family History  Problem Relation Age of Onset  . Hypertension  Mother        deceased  . Stroke Mother   . Lung cancer Father        deceased  . Diabetes Daughter   . Hypertension Daughter     Medications: Patient's Medications  New Prescriptions   No medications on file  Previous Medications   ACETAMINOPHEN (TYLENOL) 500 MG TABLET    Take 500 mg by mouth every 6 (six) hours as needed for moderate pain.   APIXABAN (ELIQUIS) 5 MG TABS TABLET    Take 1 tablet (5 mg total) by mouth every 12 (twelve) hours.   BUPROPION (WELLBUTRIN XL) 150 MG 24 HR TABLET    Take 450 mg by mouth daily.   CARVEDILOL (COREG) 12.5 MG TABLET    Take 1 tablet (12.5 mg total) by mouth 2 (two) times daily with a meal.   CHOLECALCIFEROL (VITAMIN D) 1000 UNITS TABLET    Take by mouth.   FUROSEMIDE (LASIX) 20 MG TABLET    Take 1 tablet by mouth in the morning and 1 tablet in the afternoon.   GABAPENTIN (NEURONTIN) 300 MG CAPSULE    Take 300 mg by mouth 2 (two) times daily.    HYDRALAZINE (APRESOLINE) 50 MG TABLET    Take 1 tablet (50 mg total) by mouth 2 (two) times daily.   LOSARTAN (COZAAR) 50 MG TABLET    Take 1 tablet (50 mg total) by mouth daily.   MULTIPLE VITAMIN (MULTIVITAMIN WITH MINERALS) TABS    Take 1 tablet by mouth daily.   PRAVASTATIN (PRAVACHOL) 40 MG TABLET    Take 40 mg by mouth daily.    SERTRALINE (ZOLOFT) 25 MG TABLET    Take 12.5 mg by mouth 2 (two) times daily.    VITAMIN B-12 (CYANOCOBALAMIN) 100 MCG TABLET    Take by mouth.  Modified Medications   No medications on file  Discontinued Medications   No medications on file     Physical Exam:  Vitals:   06/05/17 1314  BP: 138/74  Pulse: 60  Resp: 10  Temp: 97.7 F (36.5 C)  TempSrc: Oral  Weight: 182 lb (82.6 kg)  Height: 5\' 2"  (1.575 m)   Body mass index is 33.29 kg/m.  Physical Exam  Constitutional: She is oriented to person, place, and time. She appears well-developed and well-nourished.  HENT:  Head: Normocephalic and atraumatic.  Cardiovascular: Normal rate, regular rhythm and  normal heart sounds.  Pulmonary/Chest: Effort normal and breath sounds normal.  Abdominal: Soft. Bowel sounds are normal. There is tenderness in the suprapubic area.  Genitourinary: Right adnexum displays no mass and no tenderness. Left adnexum displays no mass and no tenderness. There is bleeding in the vagina.  Neurological: She is alert and oriented to person, place, and time.  Skin: Skin is warm and dry.  Psychiatric: She has a normal mood and  affect.    Labs reviewed: Basic Metabolic Panel: Recent Labs    09/10/16 1345 02/20/17 1101 02/26/17 0844  NA 143 142 143  K 4.2 4.0 3.9  CL 105 106 107*  CO2 25 23 22   GLUCOSE 83 93 108*  BUN 17 26 20   CREATININE 0.95 1.45* 1.48*  CALCIUM 10.8* 10.0 10.1   Liver Function Tests: No results for input(s): AST, ALT, ALKPHOS, BILITOT, PROT, ALBUMIN in the last 8760 hours. No results for input(s): LIPASE, AMYLASE in the last 8760 hours. No results for input(s): AMMONIA in the last 8760 hours. CBC: Recent Labs    02/20/17 1101  WBC 4.1  HGB 9.3*  HCT 29.3*  MCV 82  PLT 297   Lipid Panel: No results for input(s): CHOL, HDL, LDLCALC, TRIG, CHOLHDL, LDLDIRECT in the last 8760 hours. TSH: No results for input(s): TSH in the last 8760 hours. A1C: Lab Results  Component Value Date   HGBA1C 6.1 (H) 01/25/2015     Assessment/Plan 1. Vaginal bleeding - Ambulatory referral to Gynecology for further evaluation of vaginal bleeding at this time.  - CBC with Differential/Platelets  Next appt: 06/18/2017 Carlos American. [place], [facility] [phone]

## 2017-06-10 DIAGNOSIS — N950 Postmenopausal bleeding: Secondary | ICD-10-CM | POA: Diagnosis not present

## 2017-06-15 ENCOUNTER — Other Ambulatory Visit: Payer: Self-pay | Admitting: Internal Medicine

## 2017-06-15 DIAGNOSIS — D649 Anemia, unspecified: Secondary | ICD-10-CM

## 2017-06-15 DIAGNOSIS — E782 Mixed hyperlipidemia: Secondary | ICD-10-CM

## 2017-06-15 DIAGNOSIS — I5032 Chronic diastolic (congestive) heart failure: Secondary | ICD-10-CM

## 2017-06-15 DIAGNOSIS — Z79899 Other long term (current) drug therapy: Secondary | ICD-10-CM

## 2017-06-18 ENCOUNTER — Ambulatory Visit: Payer: PPO | Admitting: Internal Medicine

## 2017-06-18 ENCOUNTER — Other Ambulatory Visit: Payer: PPO

## 2017-06-20 ENCOUNTER — Ambulatory Visit: Payer: Self-pay | Admitting: Internal Medicine

## 2017-06-24 ENCOUNTER — Ambulatory Visit (INDEPENDENT_AMBULATORY_CARE_PROVIDER_SITE_OTHER): Payer: PPO | Admitting: Cardiology

## 2017-06-24 ENCOUNTER — Encounter: Payer: Self-pay | Admitting: Cardiology

## 2017-06-24 VITALS — BP 140/84 | HR 53 | Ht 62.0 in | Wt 178.2 lb

## 2017-06-24 DIAGNOSIS — E669 Obesity, unspecified: Secondary | ICD-10-CM | POA: Diagnosis not present

## 2017-06-24 DIAGNOSIS — G4733 Obstructive sleep apnea (adult) (pediatric): Secondary | ICD-10-CM

## 2017-06-24 DIAGNOSIS — I5032 Chronic diastolic (congestive) heart failure: Secondary | ICD-10-CM | POA: Diagnosis not present

## 2017-06-24 DIAGNOSIS — I110 Hypertensive heart disease with heart failure: Secondary | ICD-10-CM | POA: Diagnosis not present

## 2017-06-24 NOTE — Patient Instructions (Signed)
Medication Instructions:  Your physician recommends that you continue on your current medications as directed. Please refer to the Current Medication list given to you today.  If you need a refill on your cardiac medications, please contact your pharmacy first.  Labwork: None ordered   Testing/Procedures: None ordered   Follow-Up: Your physician wants you to follow-up in: 1 year with Dr. Turner. You will receive a reminder letter in the mail two months in advance. If you don't receive a letter, please call our office to schedule the follow-up appointment.  Any Other Special Instructions Will Be Listed Below (If Applicable).   Thank you for choosing [facility]    Rena Donivin Wirt, RN  [phone]  If you need a refill on your cardiac medications before your next appointment, please call your pharmacy.   

## 2017-06-24 NOTE — Progress Notes (Signed)
Cardiology Office Note:    Date:  06/24/2017   ID:  Susan [name], DOB February 04, 1940, MRN [id number]  PCP:  Gildardo Cranker, DO  Cardiologist:  No primary care provider on file.    Referring MD: Kelton Pillar, MD   Chief Complaint  Patient presents with  . Hypertension  . Sleep Apnea    History of Present Illness:    Susan [name] is a 77 y.o. female with a hx of chronic diastolic heart failure, hyperlipidemia, hypertension and obstructive sleep apnea.  When I last saw her she was doing fine with her CPAP device but then she later called and stated that her device broke and she need to get a new one.  A new ResMed CPAP was ordered and she is now here for follow-up per insurance requirements to document compliance.  She is doing well with her CPAP device and thinks that she has gotten used to it.  She tolerates the mask and feels the pressure is adequate.  Since going on CPAP she feels rested in the am and has no significant daytime sleepiness.  She denies any significant mouth or nasal dryness or nasal congestion.  She does not think that he snores.     Past Medical History:  Diagnosis Date  . Anxiety   . Arthritis    "legs, back" (07/29/2012)  . Chronic diastolic (congestive) heart failure ([place])   . Chronic lower back pain   . Complication of anesthesia    "I have apnea" (07/29/2012)  . Dementia    per family  . Depression   . 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 Edema 02/09/2015  . Family history of anesthesia complication    "some wake up during OR; some are hard to wake up; some both" (07/29/2012)  . Gait abnormality 06/18/2016  . GERD (gastroesophageal reflux disease)   . 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 ([place])   . History of CVA (cerebrovascular accident) 02/01/2015  . History of stomach ulcers 1980's  . Hyperlipemia 06/18/2016  . Hyperlipidemia   . Hyperparathyroidism, primary ([place]) 04/22/2012  . Hypertension   . Hypertensive heart  disease   . Incontinent of urine    wears pads  . Medication management 10/11/2016  . OSA (obstructive sleep apnea)   . OSA on CPAP   . Osteoarthritis of right knee   . Pedal edema   . Persistent atrial fibrillation (HCC)    CHADS2VASC score is 7 - on chronic anticoagulation with Apixaban  . Persistent atrial fibrillation ([place]) 01/26/2015  . Primary hyperparathyroidism ([place])   . Spinal stenosis   . Stroke ([place])   . Thyroid disease   . TIA (transient ischemic attack) 01/24/2015  . Umbilical hernia    "unrepaired" (07/29/2012)  . Varicose veins    "BLE" (07/29/2012)    Past Surgical History:  Procedure Laterality Date  . CATARACT EXTRACTION    . CHOLECYSTECTOMY  ~ 2010  . COLONOSCOPY    . IUD REMOVAL  1980's  . PARATHYROIDECTOMY N/A 06/16/2012   Procedure: NECK EXPLORATION AND LEFT SUPERIOR PARATHYROIDECTOMY;  Surgeon: Earnstine Regal, MD;  [facility];  Service: General;  Laterality: N/A;  . 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 TOTAL KNEE ARTHROPLASTY Right 07/29/2012   Procedure: TOTAL KNEE ARTHROPLASTY;  Surgeon: Ninetta Lights, MD;  Location: [facility];  Service: Orthopedics;  Laterality: Right;    Current Medications: Current Meds  Medication Sig  . acetaminophen (TYLENOL) 500  MG tablet Take 500 mg by mouth every 6 (six) hours as needed for moderate pain.  Marland Kitchen apixaban (ELIQUIS) 5 MG TABS tablet Take 1 tablet (5 mg total) by mouth every 12 (twelve) hours.  Marland Kitchen buPROPion (WELLBUTRIN XL) 150 MG 24 hr tablet Take 450 mg by mouth daily.  . carvedilol (COREG) 12.5 MG tablet Take 1 tablet (12.5 mg total) by mouth 2 (two) times daily with a meal.  . cholecalciferol (VITAMIN D) 1000 units tablet Take by mouth.  . furosemide (LASIX) 20 MG tablet Take 1 tablet by mouth in the morning and 1 tablet in the afternoon.  . gabapentin (NEURONTIN) 300 MG capsule Take 300 mg by mouth 2 (two) times daily.   . hydrALAZINE (APRESOLINE) 50 MG tablet Take 1 tablet (50 mg total) by mouth 2 (two)  times daily.  . IRON PO Take 1 capsule by mouth daily.  Marland Kitchen losartan (COZAAR) 50 MG tablet Take 1 tablet (50 mg total) by mouth daily.  . Multiple Vitamin (MULTIVITAMIN WITH MINERALS) TABS Take 1 tablet by mouth daily.  . pravastatin (PRAVACHOL) 40 MG tablet Take 40 mg by mouth daily.   . sertraline (ZOLOFT) 25 MG tablet Take 12.5 mg by mouth 2 (two) times daily.   . vitamin B-12 (CYANOCOBALAMIN) 100 MCG tablet Take by mouth.     Allergies:   Ace inhibitors; Lipitor [atorvastatin]; Simvastatin; and Latex   Social History   Socioeconomic History  . Marital status: Widowed    Spouse name: Not on file  . Number of children: Not on file  . Years of education: Not on file  . Highest education level: Not on file  Occupational History  . Occupation: retired    Fish farm manager: RETIRED  Social Needs  . Financial resource strain: Not on file  . Food insecurity:    Worry: Not on file    Inability: Not on file  . Transportation needs:    Medical: Not on file    Non-medical: Not on file  Tobacco Use  . Smoking status: Former Smoker    Packs/day: 1.00    Years: 30.00    Pack years: 30.00    Types: Cigarettes    Last attempt to quit: 01/07/1978    Years since quitting: 39.4  . Smokeless tobacco: Never Used  Substance and Sexual Activity  . Alcohol use: No  . Drug use: No  . Sexual activity: Never  Lifestyle  . Physical activity:    Days per week: Not on file    Minutes per session: Not on file  . Stress: Not on file  Relationships  . Social connections:    Talks on phone: Not on file    Gets together: Not on file    Attends religious service: Not on file    Active member of club or organization: Not on file    Attends meetings of clubs or organizations: Not on file    Relationship status: Not on file  Other Topics Concern  . Not on file  Social History Narrative   Lives at home alone   Right-handed   Caffeine: 1 cup of coffee per day         Diet:      Caffeine:       Married, if yes what year:       Do you live in a house, apartment, assisted living, condo, trailer, ect:      Pets:      Current/Past profession:  Exercise:         Living Will: Yes   DNR: No   POA/HPOA: Yes      Functional Status:   Do you have difficulty bathing or dressing yourself? Yes   Do you have difficulty preparing food or eating? No   Do you have difficulty managing your medications? Yes   Do you have difficulty managing your finances? Yes   Do you have difficulty affording your medications? Yes     Family History: The patient's family history includes Diabetes in her daughter; Hypertension in her daughter and mother; Lung cancer in her father; Stroke in her mother.  ROS:   Please see the history of present illness.    ROS  All other systems reviewed and negative.   EKGs/Labs/Other Studies Reviewed:    The following studies were reviewed today: PAP download  EKG:  EKG is not ordered today.  Recent Labs: 02/26/2017: BUN 20; Creatinine, Ser 1.48; Potassium 3.9; Sodium 143 06/05/2017: Hemoglobin 8.9; Platelets 224   Recent Lipid Panel    Component Value Date/Time   CHOL 215 (H) 04/06/2015 1136   TRIG 120 04/06/2015 1136   HDL 63 04/06/2015 1136   CHOLHDL 3.4 04/06/2015 1136   CHOLHDL 4.0 01/25/2015 0408   VLDL 31 01/25/2015 0408   LDLCALC 128 (H) 04/06/2015 1136    Physical Exam:    VS:  BP 140/84   Pulse (!) 53   Ht 5\' 2"  (1.575 m)   Wt 178 lb 3.2 oz (80.8 kg)   SpO2 98%   BMI 32.59 kg/m     Wt Readings from Last 3 Encounters:  06/24/17 178 lb 3.2 oz (80.8 kg)  06/05/17 182 lb (82.6 kg)  05/16/17 178 lb (80.7 kg)     GEN:  Well nourished, well developed in no acute distress HEENT: Normal NECK: No JVD; No carotid bruits LYMPHATICS: No lymphadenopathy CARDIAC: RRR, no murmurs, rubs, gallops RESPIRATORY:  Clear to auscultation without rales, wheezing or rhonchi  ABDOMEN: Soft, non-tender, non-distended MUSCULOSKELETAL:  No edema; No  deformity  SKIN: Warm and dry NEUROLOGIC:  Alert and oriented x 3 PSYCHIATRIC:  Normal affect   ASSESSMENT:    1. OSA (obstructive sleep apnea)   2. Hypertensive heart disease with chronic diastolic congestive heart failure (HCC)   3. Obesity (BMI 30-39.9)    PLAN:    In order of problems listed above:  1.  OSA - the patient is tolerating PAP therapy well without any problems. The PAP download was reviewed today and showed an AHI of 1.1/hr on 10 cm H2O with 40% compliance in using more than 4 hours nightly.  The patient has been using and benefiting from PAP use and will continue to benefit from therapy.  I encouraged her to try to be more compliant with her device that she is falling short on using more than 70% of the time 4 hours more.  He says she has been using it every day even though after we decreased down to a 2-week download it still says she is only 40% compliant.  My assistant Gershon Cull will call advanced home care to try to get her device looked at as I suspect there is a problem with a calculation of usage in her device.  2.  Hypertension -BP is well controlled on exam today.  She will continue on carvedilol 12.5 mg twice daily, losartan 50 mg daily and hydralazine 50 mg twice daily.  3. Obesity - I have encouraged her  to get into a routine exercise program and cut back on carbs and portions.    Medication Adjustments/Labs and Tests Ordered: Current medicines are reviewed at length with the patient today.  Concerns regarding medicines are outlined above.  No orders of the defined types were placed in this encounter.  No orders of the defined types were placed in this encounter.   Signed, Fransico Him, MD  06/24/2017 11:13 AM    [place]

## 2017-06-26 DIAGNOSIS — N950 Postmenopausal bleeding: Secondary | ICD-10-CM | POA: Diagnosis not present

## 2017-06-26 DIAGNOSIS — D259 Leiomyoma of uterus, unspecified: Secondary | ICD-10-CM | POA: Diagnosis not present

## 2017-06-27 ENCOUNTER — Telehealth: Payer: Self-pay

## 2017-06-27 NOTE — Telephone Encounter (Signed)
Appointment scheduled.

## 2017-06-27 NOTE — Patient Instructions (Addendum)
Your procedure is scheduled on:  Tuesday, July 2  Enter through the [employer] [facility] at: 6 am  Pick up the phone at the desk and dial [phone].  Call this number if you have problems the morning of surgery: [phone].  Remember: Do NOT eat food or Do NOT drink clear liquids (including water) after midnight Monday  Take these medicines the morning of surgery with a SIP OF WATER: carvedilol, hydralazine, wellbutrin, gabapentin, zoloft and tylenol if needed.  Eliquis - contract prescribing doctor to find out when to stop medication prior to surgery and when to restart medication after surgery.  Bring inhaler with you on day of surgery.  Do Not smoke on the day of surgery.  Stop herbal medications, vitamin supplements, ibuprofen/NSAIDS 1 week prior to surgery.  Do NOT wear jewelry (body piercing), metal hair clips/bobby pins, make-up, or nail polish. Do NOT wear lotions, powders, or perfumes.  You may wear deoderant. Do NOT shave for 48 hours prior to surgery. Do NOT bring valuables to the hospital. Contacts, dentures, or bridgework may not be worn into surgery.  Have a responsible adult drive you home and stay with you for 24 hours after your procedure.

## 2017-06-27 NOTE — Telephone Encounter (Signed)
I left a message for patient to call the office to schedule an appointment for surgical clearance. [facility] called today to say that patient would need clearance for surgery. They will fax over clearance form.

## 2017-06-30 ENCOUNTER — Encounter (HOSPITAL_COMMUNITY)
Admission: RE | Admit: 2017-06-30 | Discharge: 2017-06-30 | Disposition: A | Discharge status: Home / Self Care | Payer: PPO | Source: Ambulatory Visit | Attending: Obstetrics and Gynecology | Admitting: Obstetrics and Gynecology

## 2017-06-30 NOTE — Patient Instructions (Signed)
Your procedure is scheduled on:  Tuesday, July 2  Enter through the [employer] [facility] at: 6 am  Pick up the phone at the desk and dial [phone].  Call this number if you have problems the morning of surgery: [phone].  Remember: Do NOT eat food or Do NOT drink clear liquids (including water) after midnight Monday  Take these medicines the morning of surgery with a SIP OF WATER: carvedilol, hydralazine, wellbutrin, gabapentin, zoloft and tylenol if needed.  Eliquis - contract prescribing doctor to find out when to stop medication prior to surgery and when to restart medication after surgery.  Bring inhaler with you on day of surgery.  Do Not smoke on the day of surgery.  Stop herbal medications, vitamin supplements, ibuprofen/NSAIDS 1 week prior to surgery.  Do NOT wear jewelry (body piercing), metal hair clips/bobby pins, make-up, or nail polish. Do NOT wear lotions, powders, or perfumes.  You may wear deoderant. Do NOT shave for 48 hours prior to surgery. Do NOT bring valuables to the hospital. Contacts, dentures, or bridgework may not be worn into surgery.  Have a responsible adult drive you home and stay with you for 24 hours after your procedure.

## 2017-07-01 ENCOUNTER — Encounter: Payer: Self-pay | Admitting: Nurse Practitioner

## 2017-07-01 ENCOUNTER — Ambulatory Visit (INDEPENDENT_AMBULATORY_CARE_PROVIDER_SITE_OTHER): Payer: PPO | Admitting: Nurse Practitioner

## 2017-07-01 VITALS — BP 148/86 | HR 63 | Temp 98.1°F | Ht 62.0 in | Wt 174.0 lb

## 2017-07-01 DIAGNOSIS — I481 Persistent atrial fibrillation: Secondary | ICD-10-CM | POA: Diagnosis not present

## 2017-07-01 DIAGNOSIS — I5032 Chronic diastolic (congestive) heart failure: Secondary | ICD-10-CM

## 2017-07-01 DIAGNOSIS — Z01818 Encounter for other preprocedural examination: Secondary | ICD-10-CM | POA: Diagnosis not present

## 2017-07-01 DIAGNOSIS — N939 Abnormal uterine and vaginal bleeding, unspecified: Secondary | ICD-10-CM

## 2017-07-01 DIAGNOSIS — I4819 Other persistent atrial fibrillation: Secondary | ICD-10-CM

## 2017-07-01 NOTE — Progress Notes (Signed)
Careteam: Patient Care Team: Gildardo Cranker, DO as PCP - General (Internal Medicine)  Advanced Directive information Does Patient Have a Medical Advance Directive?: No  Allergies  Allergen Reactions  . Ace Inhibitors Cough  . Lipitor [Atorvastatin] Swelling  . Simvastatin Other (See Comments)    Myalgias     . Latex Itching    No chief complaint on file.    HPI: Patient is a 77 y.o. female seen in the office today for surgery review. Pt [facility], noted to have polyps and/or fibroids. Plans to have hysteroscopy with D&C. Pt reports she is not having vaginal bleeding at this time.  Procedure planned on 07/09/17 under general anesthesia with option to change. Pt with hx of CHF, persistent afib, HTN, OSA, anxiety, depression, hx of CVA with right hemiparesis and dementia.  She is not familiar with her medications. [facility] does her medication management.   Following with Dr Fransico Him, Cardiology for a fib, CHF, HTN and OSA. Recently seen on 06/24/17.   Review of Systems:  Review of Systems  Constitutional: Negative for diaphoresis, fever, malaise/fatigue and weight loss.  Eyes: Negative.   Respiratory: Negative for cough and shortness of breath.   Cardiovascular: Negative for chest pain, palpitations and leg swelling.  Gastrointestinal: Negative for abdominal pain, blood in stool, melena, nausea and vomiting.  Genitourinary: Negative for dysuria, frequency, hematuria and urgency.       Vaginal bleeding- this has resolved at this time  Neurological: Negative for dizziness and headaches.  Psychiatric/Behavioral: Positive for memory loss.    Past Medical History:  Diagnosis Date  . Anxiety   . Arthritis    "legs, back" (07/29/2012)  . Chronic diastolic (congestive) heart failure ([place])   . Chronic lower back pain   . Complication of anesthesia    "I have apnea" (07/29/2012)  .  Dementia    per family  . Depression   . 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 Edema 02/09/2015  . Family history of anesthesia complication    "some wake up during OR; some are hard to wake up; some both" (07/29/2012)  . Gait abnormality 06/18/2016  . GERD (gastroesophageal reflux disease)   . 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 ([place])   . History of CVA (cerebrovascular accident) 02/01/2015  . History of stomach ulcers 1980's  . Hyperlipidemia   . Hyperparathyroidism, primary ([place]) 04/22/2012  . Hypertensive heart disease   . Incontinent of urine    wears pads  . Medication management 10/11/2016  . OSA on CPAP   . Osteoarthritis of right knee   . Pedal edema   . Persistent atrial fibrillation (HCC)    CHADS2VASC score is 7 - on chronic anticoagulation with Apixaban  . Primary hyperparathyroidism ([place])   . Spinal stenosis   . Stroke ([place])   . Thyroid disease   . TIA (transient ischemic attack) 01/24/2015  . Umbilical hernia    "unrepaired" (07/29/2012)  . Varicose veins    "BLE" (07/29/2012)   Past Surgical History:  Procedure Laterality Date  . CATARACT EXTRACTION    . CHOLECYSTECTOMY  ~ 2010  . COLONOSCOPY    . IUD REMOVAL  1980's  . PARATHYROIDECTOMY N/A 06/16/2012   Procedure: NECK EXPLORATION AND LEFT SUPERIOR PARATHYROIDECTOMY;  Surgeon: Earnstine Regal, MD;  Location: [facility];  Service: General;  Laterality: N/A;  . TOTAL KNEE  ARTHROPLASTY Right 07/29/2012  . TOTAL KNEE ARTHROPLASTY Right 07/29/2012   Procedure: TOTAL KNEE ARTHROPLASTY;  Surgeon: Ninetta Lights, MD;  Location: [facility];  Service: Orthopedics;  Laterality: Right;   Social History:   reports that she quit smoking about 39 years ago. Her smoking use included cigarettes. She has a 30.00 pack-year smoking history. She has never used smokeless tobacco. She reports that she does not drink alcohol or use drugs.  Family History  Problem Relation Age of Onset  . Hypertension Mother         deceased  . Stroke Mother   . Lung cancer Father        deceased  . Diabetes Daughter   . Hypertension Daughter     Medications: Patient's Medications  New Prescriptions   No medications on file  Previous Medications   ACETAMINOPHEN (TYLENOL) 500 MG TABLET    Take 1,000 mg by mouth daily as needed for moderate pain.    APIXABAN (ELIQUIS) 5 MG TABS TABLET    Take 1 tablet (5 mg total) by mouth every 12 (twelve) hours.   BUPROPION (WELLBUTRIN XL) 150 MG 24 HR TABLET    Take 450 mg by mouth daily.   CARVEDILOL (COREG) 12.5 MG TABLET    Take 12.5 mg by mouth 2 (two) times daily with a meal.   CHOLECALCIFEROL (VITAMIN D) 1000 UNITS TABLET    Take 1,000 Units by mouth daily.    CLOBETASOL OINTMENT (TEMOVATE) 0.05 %    Apply 1 application topically 2 (two) times daily.   DOCUSATE SODIUM (COLACE) 100 MG CAPSULE    Take 100 mg by mouth daily.   FUROSEMIDE (LASIX) 20 MG TABLET    Take 1 tablet by mouth in the morning and 1 tablet in the afternoon.   GABAPENTIN (NEURONTIN) 300 MG CAPSULE    Take 300 mg by mouth 2 (two) times daily.    HYDRALAZINE (APRESOLINE) 50 MG TABLET    Take 1 tablet (50 mg total) by mouth 2 (two) times daily.   IRON POLYSACCHARIDES (IFEREX 150) 150 MG CAPSULE    Take 150 mg by mouth daily.   LOSARTAN (COZAAR) 50 MG TABLET    Take 1 tablet (50 mg total) by mouth daily.   MULTIPLE VITAMIN (MULTIVITAMIN WITH MINERALS) TABS    Take 1 tablet by mouth daily.   PRAVASTATIN (PRAVACHOL) 40 MG TABLET    Take 40 mg by mouth at bedtime.    SERTRALINE (ZOLOFT) 25 MG TABLET    Take 12.5 mg by mouth 2 (two) times daily.    VITAMIN B-12 (CYANOCOBALAMIN) 1000 MCG TABLET    Take 2,000 mcg by mouth daily.  Modified Medications   No medications on file  Discontinued Medications   CARVEDILOL (COREG) 12.5 MG TABLET    Take 1 tablet (12.5 mg total) by mouth 2 (two) times daily with a meal.     Physical Exam:  Vitals:   07/01/17 1332  BP: (!) 148/86  Pulse: 63  Temp: 98.1 F  (36.7 C)  TempSrc: Oral  SpO2: 96%  Weight: 174 lb (78.9 kg)  Height: 5\' 2"  (1.575 m)   Body mass index is 31.83 kg/m.  Physical Exam  Constitutional: She is oriented to person, place, and time. She appears well-developed and well-nourished. No distress.  HENT:  Head: Normocephalic and atraumatic.  Mouth/Throat: Oropharynx is clear and moist. No oropharyngeal exudate.  Eyes: Pupils are equal, round, and reactive to light. Conjunctivae are normal.  Neck: Normal  range of motion. Neck supple.  Cardiovascular: Regular rhythm. Bradycardia present.  Murmur heard. Pulmonary/Chest: Effort normal and breath sounds normal.  Abdominal: Soft. Bowel sounds are normal.  Musculoskeletal: She exhibits edema (1+ bilaterally). She exhibits no tenderness.  Neurological: She is alert and oriented to person, place, and time.  Skin: Skin is warm and dry. She is not diaphoretic.  Psychiatric: She has a normal mood and affect.    Labs reviewed: Basic Metabolic Panel: Recent Labs    09/10/16 1345 02/20/17 1101 02/26/17 0844  NA 143 142 143  K 4.2 4.0 3.9  CL 105 106 107*  CO2 25 23 22   GLUCOSE 83 93 108*  BUN 17 26 20   CREATININE 0.95 1.45* 1.48*  CALCIUM 10.8* 10.0 10.1   Liver Function Tests: No results for input(s): AST, ALT, ALKPHOS, BILITOT, PROT, ALBUMIN in the last 8760 hours. No results for input(s): LIPASE, AMYLASE in the last 8760 hours. No results for input(s): AMMONIA in the last 8760 hours. CBC: Recent Labs    02/20/17 1101 06/05/17 1413  WBC 4.1 3.5*  NEUTROABS  --  2,090  HGB 9.3* 8.9*  HCT 29.3* 27.4*  MCV 82 85.4  PLT 297 224   Lipid Panel: No results for input(s): CHOL, HDL, LDLCALC, TRIG, CHOLHDL, LDLDIRECT in the last 8760 hours. TSH: No results for input(s): TSH in the last 8760 hours. A1C: Lab Results  Component Value Date   HGBA1C 6.1 (H) 01/25/2015     Assessment/Plan 1. Vaginal bleeding Has improved at this time. Plan for hysteroscopy with D&C  under general anesthesia.   2. Chronic diastolic (congestive) heart failure ([place]) Followed by cardiologist Dr Radford Pax. 2D echo in 04/2015 showed EF 55-60% (prev30-35%).  3. Persistent atrial fibrillation (HCC) Rate controlled, currently on coreg 12.5 mg by mouth twice daily with eliquis 5 mg twice daily, confirmed with [facility]   4. Pre-op evaluation Pt with hx of OSA, chf, a fib needing hysterocopic evaluation and resection of poly and /or submucosal fibroids causing bleeding. Spoke with Dr Eulas Post pts PCP and due to extensive heart hx with OSA will need cardiac evaluation prior to moving forward with procedure.   Carlos American. [place], [facility] [phone]

## 2017-07-01 NOTE — Patient Instructions (Addendum)
Due to hx of Congestive heart failure, A fib, high blood pressure and OSA will need to have a Cardiac evaluation for surgical clearance at this time.  [facility] will need to set this appt up due to new office policy   [facility] [place] [address] [place],  [place] [address] [phone]

## 2017-07-02 ENCOUNTER — Inpatient Hospital Stay (HOSPITAL_COMMUNITY)
Admission: RE | Admit: 2017-07-02 | Discharge: 2017-07-02 | Disposition: A | Discharge status: Home / Self Care | Payer: Self-pay | Source: Ambulatory Visit

## 2017-07-02 ENCOUNTER — Telehealth: Payer: Self-pay | Admitting: Cardiology

## 2017-07-02 ENCOUNTER — Telehealth: Payer: Self-pay | Admitting: *Deleted

## 2017-07-02 DIAGNOSIS — N939 Abnormal uterine and vaginal bleeding, unspecified: Secondary | ICD-10-CM

## 2017-07-02 NOTE — Telephone Encounter (Signed)
Daughter notified 

## 2017-07-02 NOTE — Telephone Encounter (Signed)
   Primary Cardiologist: Fransico Him, MD  Chart reviewed as part of pre-operative protocol coverage. Patient was contacted 07/02/2017 in reference to pre-operative risk assessment for pending surgery as outlined below.  Susan [name] was last seen on 06/24/17 by Dr. Radford Pax. H/o OSA, HTN, obesity, chronic diastolic CHF with prior LV dysfunction, CVA, HLD, GERD, incontinence, edema, TIA, varicose veins, persistent atrial fibrillation. Last EKG 2017. Since just seen by Dr. Radford Pax will forward to her for input on pre-op clearance, and will forward to pharm for input on Eliquis. Dr. Radford Pax - Please route response to P CV DIV PREOP (the pre-op pool). Thank you.    Charlie Pitter, PA-C 07/02/2017, 5:17 PM

## 2017-07-02 NOTE — Telephone Encounter (Signed)
Patient's daughter called asking for a referral to another GYN for a 2nd opinion on the proposed surgery. She said neither the patient or daughter want the surgery if there will be no long-term problems. I advised that was something that needs to be discussed with the GYN. She just wants a 2nd opinion from someone that isn't ready to jump into doing surgery if it can be avoided.

## 2017-07-02 NOTE — Telephone Encounter (Signed)
Follow up      [facility] Pre-operative Risk Assessment    Request for surgical clearance:  1. What type of surgery is being performed? Hysteroscopy DNC with Myosure   2. When is this surgery scheduled? 07/08/17  3. What type of clearance is required (medical clearance vs. Pharmacy clearance to hold med vs. Both)? Both  4. Are there any medications that need to be held prior to surgery and how long? Eliquis and doesn't know how long  5. Practice name and name of physician performing surgery? [facility]/ Dr. Arvella Nigh  6. What is your office phone number [phone]   7.   What is your office fax number [phone]  8.   Anesthesia type (None, local, MAC, general) ? Not sure   Nicholes Stairs 07/02/2017, 10:55 AM  _________________________________________________________________   (provider comments below)

## 2017-07-02 NOTE — Patient Instructions (Signed)
Your procedure is scheduled on:  Tuesday, July 2  Enter through the [employer] of [facility] at: 6 am  Pick up the phone at the desk and dial [phone].  Call this number if you have problems the morning of surgery: [phone].  Remember: Do NOT eat food or Do NOT drink clear liquids (including water) after midnight Monday  Take these medicines the morning of surgery with a SIP OF WATER: carvedilol, hydralazine, wellbutrin, gabapentin, zoloft and tylenol if needed.  Eliquis - contract prescribing doctor to find out when to stop medication prior to surgery and when to restart medication after surgery.  Bring inhaler with you on day of surgery.  Do Not smoke on the day of surgery.  Stop herbal medications, vitamin supplements, ibuprofen/NSAIDS 1 week prior to surgery.  Do NOT wear jewelry (body piercing), metal hair clips/bobby pins, make-up, or nail polish. Do NOT wear lotions, powders, or perfumes.  You may wear deoderant. Do NOT shave for 48 hours prior to surgery. Do NOT bring valuables to the hospital. Contacts, dentures, or bridgework may not be worn into surgery.  Have a responsible adult drive you home and stay with you for 24 hours after your procedure.

## 2017-07-02 NOTE — Telephone Encounter (Signed)
Risk of major cardiac event is 6.6% which is moderate risk for major cardiac event mainly due to h/o CHF and CVA.  Given her h/o CVA should only be off Eliquis 24 hours if possible

## 2017-07-02 NOTE — Telephone Encounter (Signed)
[facility] for 2nd opinion GYN referral for vaginal bleeding - referral placed

## 2017-07-02 NOTE — Telephone Encounter (Signed)
New message   Patient calling to discuss upcoming GYN surgery.  Advised patient to have OBGYN office contact office to complete pre op clearance. Patient states she wants to see Dr Radford Pax in person to discuss procedure and risks factors.  Informed patient she may not need appointment as she was just seen 06/24/17. Patient request call from Dr Radford Pax

## 2017-07-03 NOTE — Telephone Encounter (Signed)
Pharm pool - See below - Dr. Radford Pax already weighed in on Eliquis. Have routed recs as outlined in her note. Jasin Brazel PA-C

## 2017-07-03 NOTE — Telephone Encounter (Signed)
   Primary Cardiologist: Fransico Him, MD  Chart reviewed as part of pre-operative protocol coverage. See notes below. Dr. Radford Pax was contacted to weigh in on operative risk given recent OV.   Per Dr. Radford Pax, "Risk of major cardiac event is 6.6% which is moderate risk for major cardiac event mainly due to h/o CHF and CVA.  Given her h/o CVA should only be off Eliquis 24 hours if possible."  I will route this recommendation to the requesting party via [facility] fax function and remove from pre-op pool.  Please call with questions.  Charlie Pitter, PA-C 07/03/2017, 7:58 AM

## 2017-07-07 ENCOUNTER — Inpatient Hospital Stay (HOSPITAL_COMMUNITY)
Admission: RE | Admit: 2017-07-07 | Discharge: 2017-07-07 | Disposition: A | Discharge status: Home / Self Care | Payer: Self-pay | Source: Ambulatory Visit

## 2017-07-08 ENCOUNTER — Ambulatory Visit (HOSPITAL_COMMUNITY): Admission: AD | Admit: 2017-07-08 | Payer: PPO | Source: Ambulatory Visit | Admitting: Obstetrics and Gynecology

## 2017-07-08 SURGERY — DILATATION & CURETTAGE/HYSTEROSCOPY WITH MYOSURE
Anesthesia: Choice

## 2017-07-09 ENCOUNTER — Telehealth: Payer: Self-pay | Admitting: *Deleted

## 2017-07-09 ENCOUNTER — Ambulatory Visit (INDEPENDENT_AMBULATORY_CARE_PROVIDER_SITE_OTHER): Payer: PPO

## 2017-07-09 ENCOUNTER — Ambulatory Visit: Payer: PPO | Admitting: Obstetrics & Gynecology

## 2017-07-09 ENCOUNTER — Other Ambulatory Visit: Payer: Self-pay | Admitting: Obstetrics & Gynecology

## 2017-07-09 ENCOUNTER — Encounter: Payer: Self-pay | Admitting: Obstetrics & Gynecology

## 2017-07-09 VITALS — BP 122/80 | Temp 98.8°F | Ht 62.0 in | Wt 178.0 lb

## 2017-07-09 DIAGNOSIS — D251 Intramural leiomyoma of uterus: Secondary | ICD-10-CM | POA: Diagnosis not present

## 2017-07-09 DIAGNOSIS — N950 Postmenopausal bleeding: Secondary | ICD-10-CM | POA: Diagnosis not present

## 2017-07-09 DIAGNOSIS — D252 Subserosal leiomyoma of uterus: Secondary | ICD-10-CM

## 2017-07-09 NOTE — Progress Notes (Signed)
    Susan [name] [birth year] [id number]        77 y.o.  G3P0003   RP: PMB in early June 2019  HPI: Menopause on no hormone replacement therapy.  Light PMB early June x a few days.  No recurrence of vaginal bleeding since then.  No pelvic pain.  Abstinent.  Urine and bowel movements normal. Body mass index 32.56.  OB History  Gravida Para Term Preterm AB Living  3 1     0 3  SAB TAB Ectopic Multiple Live Births        0      # Outcome Date GA Lbr Len/2nd Weight Sex Delivery Anes PTL Lv  3 Gravida           2 Gravida           1 Para             Past medical history,surgical history, problem list, medications, allergies, family history and social history were all reviewed and documented in the EPIC chart.   Directed ROS with pertinent positives and negatives documented in the history of present illness/assessment and plan.  Exam:  Vitals:   07/09/17 1049  Weight: 178 lb (80.7 kg)  Height: 5\' 2"  (1.575 m)   General appearance:  Normal  Abdomen: Normal  Gynecologic exam: Vulva normal.  Speculum:  Cervix normal, no polyp seen.  Vagina normal.  No blood present, normal secretions.  Pelvic US today: T/V and T/a images.  Anteverted uterus measuring 8.13 x 7.93 x 4.38 cm.  Unable to identify the endometrial line.  Intramural fibroids measuring 3.6 x 3.2 cm, 2.0 x 2.4 cm, 1 cm, 1.9 x 2.0 cm, 2.0 x 2.0 cm, 2.0 x 2.0 cm.  Subserous right fibroid at the fundus measuring 4.6 x 3.3 cm.  Right and left ovary not seen.  No free fluid in the posterior cul-de-sac.   Assessment/Plan:  77 y.o. G3P0003   1. Postmenopausal bleeding Postmenopausal bleeding.  Pelvic ultrasound today showing many intramural uterine fibroids.  Unable to identify the endometrial line.  For that reason, decision to proceed with a sonohysterogram.  An endometrial biopsy will be done at that time per findings.  Today's findings and management plan discussed with patient and her daughter who voiced understanding and  agreement.  Patient will take ibuprofen prior to the procedure. - [place] Sonohysterogram; Future  Counseling on above issues and coordination of care more than 50% for 30 minutes.  Princess Bruins MD, 10:58 AM 07/09/2017

## 2017-07-09 NOTE — Telephone Encounter (Signed)
From Dr Landis Gandy 6/18 note:  The PAP download was reviewed today and showed an AHI of 1.1/hr on 10 cm H2O with 40% compliance in using more than 4 hours nightly.  I encouraged her to try to be more compliant with her device that she is falling short on using more than 70% of the time 4 hours more. She says she has been using it every day even though after we decreased down to a 2-week download it still says she is only 40% compliant.  My assistant Gershon Cull will call advanced home care to try to get her device looked at as I suspect there is a problem with a calculation of usage in her device.  I reached out to [facility] to ask if they could check out the patients cpap.Their findings were that the patient was not being compliant

## 2017-07-13 ENCOUNTER — Encounter: Payer: Self-pay | Admitting: Obstetrics & Gynecology

## 2017-07-13 NOTE — Patient Instructions (Signed)
1. Postmenopausal bleeding Postmenopausal bleeding.  Pelvic ultrasound today showing many intramural uterine fibroids.  Unable to identify the endometrial line.  For that reason, decision to proceed with a sonohysterogram.  An endometrial biopsy will be done at that time per findings.  Today's findings and management plan discussed with patient and her daughter who voiced understanding and agreement.  Patient will take ibuprofen prior to the procedure. - [place] Sonohysterogram; Future  Susan [name], it was a pleasure meeting you today!  I will see you again soon for the sonohysterogram.

## 2017-08-04 DIAGNOSIS — D2372 Other benign neoplasm of skin of left lower limb, including hip: Secondary | ICD-10-CM | POA: Diagnosis not present

## 2017-08-04 DIAGNOSIS — M2042 Other hammer toe(s) (acquired), left foot: Secondary | ICD-10-CM | POA: Diagnosis not present

## 2017-08-04 DIAGNOSIS — M21612 Bunion of left foot: Secondary | ICD-10-CM | POA: Diagnosis not present

## 2017-08-04 DIAGNOSIS — M79672 Pain in left foot: Secondary | ICD-10-CM | POA: Diagnosis not present

## 2017-08-13 ENCOUNTER — Other Ambulatory Visit: Payer: Self-pay | Admitting: Obstetrics & Gynecology

## 2017-08-13 DIAGNOSIS — N950 Postmenopausal bleeding: Secondary | ICD-10-CM

## 2017-08-15 ENCOUNTER — Other Ambulatory Visit: Payer: Self-pay

## 2017-08-15 MED ORDER — SERTRALINE HCL 25 MG PO TABS: 12.5000 mg | ORAL_TABLET | Freq: Two times a day (BID) | ORAL | 1 refills | Status: DC

## 2017-08-15 NOTE — Telephone Encounter (Signed)
A refill request was made by [facility] for sertraline 25 mg. Rx was sent to the pharmacy electronically.

## 2017-08-19 DIAGNOSIS — M79672 Pain in left foot: Secondary | ICD-10-CM | POA: Diagnosis not present

## 2017-08-19 DIAGNOSIS — D2372 Other benign neoplasm of skin of left lower limb, including hip: Secondary | ICD-10-CM | POA: Diagnosis not present

## 2017-08-27 ENCOUNTER — Encounter: Payer: Self-pay | Admitting: Internal Medicine

## 2017-08-27 ENCOUNTER — Ambulatory Visit: Payer: PPO | Admitting: Obstetrics & Gynecology

## 2017-08-27 ENCOUNTER — Other Ambulatory Visit: Payer: Self-pay | Admitting: Obstetrics & Gynecology

## 2017-08-27 ENCOUNTER — Ambulatory Visit (INDEPENDENT_AMBULATORY_CARE_PROVIDER_SITE_OTHER): Payer: PPO

## 2017-08-27 DIAGNOSIS — N950 Postmenopausal bleeding: Secondary | ICD-10-CM

## 2017-08-27 DIAGNOSIS — D250 Submucous leiomyoma of uterus: Secondary | ICD-10-CM

## 2017-08-27 NOTE — Progress Notes (Signed)
Susan [name] 15-Mar-1940 [id number]        77 y.o.  G3P0003   RP: PMB/Fibroids for Sonohysterogram  HPI: PMB in 06/2017, no recurrence since then.  No pelvic pain.  Pelvic US 07/09/2017 showed Uterine fibroids which prevented a good visualization of the Endometrial line.   OB History  Gravida Para Term Preterm AB Living  3 1     0 3  SAB TAB Ectopic Multiple Live Births        0      # Outcome Date GA Lbr Len/2nd Weight Sex Delivery Anes PTL Lv  3 Gravida           2 Gravida           1 Para             Past medical history,surgical history, problem list, medications, allergies, family history and social history were all reviewed and documented in the EPIC chart.   Directed ROS with pertinent positives and negatives documented in the history of present illness/assessment and plan.  Exam:  There were no vitals filed for this visit. General appearance:  Normal                                                                    Sono Infusion Hysterogram ( procedure note)   The initial transvaginal ultrasound, done on 07/09/2017, demonstrated the following:  T/V and T/a images.  Anteverted uterus measuring 8.13 x 7.93 x 4.38 cm.  Unable to identify the endometrial line.  Intramural fibroids measuring 3.6 x 3.2 cm, 2.0 x 2.4 cm, 1 cm, 1.9 x 2.0 cm, 2.0 x 2.0 cm, 2.0 x 2.0 cm.  Subserous right fibroid at the fundus measuring 4.6 x 3.3 cm.  Right and left ovary not seen.  No free fluid in the posterior cul-de-sac.   The speculum  was inserted and the cervix cleansed with Betadine solution after confirming that patient has no allergies.A small sonohysterography catheterwas utilized.  Insertion was facilitated with ring forceps, using a spear-like motion the catheter was inserted to the fundus of the uterus. The speculum is then removed carefully to avoid dislodging the catheter. The catheter was flushed with sterile saline delete prior to insertion to rid it of small amounts of air.the  sterile saline solution was infused into the uterine cavity as a vaginal ultrasound probe was then placed in the vagina for full visualization of the uterine cavity from a transvaginal approach. The following was noted:  Following injection of saline the endometrial cavity is filled and defects are seen corresponding to a submucosal fibroids measured at 2.2 x 1.3 cm, 1.5 x 1.5 cm, 1.7 x 1.5 cm.  The endometrial lining is measured at a total of 3.5 mm (anterior at 1.8 mm and posterior at 1.7 mm)  The catheter was then removed after retrieving some of the saline from the intrauterine cavity. An endometrial biopsy was done. Patient tolerated procedure well. She had received a tablet of Aleve for discomfort.    Assessment/Plan:  77 y.o. G3P0003   1. Postmenopausal bleeding Postmenopausal bleeding in June, no recurrence since then.  Probably associated with submucosal fibroids.  Thin endometrial line at 3.5 mm today.  Endometrial biopsy successfully performed, pathology  pending.  Patient reassured.  Will observe if endometrial biopsy is benign.  2. Fibroids, submucosal 3 submucosal fibroid seen by sonohysterogram, measuring 2.2 cm, 1.7 cm and 1.5 cm.  If patient has a benign endometrial biopsy and no further postmenopausal bleeding, no indication to excise the fibroids at this time in her life.  Counseling on above issues and coordination of care more than 50% for 15 minutes.  Princess Bruins MD, 4:38 PM 08/27/2017

## 2017-08-31 ENCOUNTER — Encounter: Payer: Self-pay | Admitting: Obstetrics & Gynecology

## 2017-08-31 NOTE — Patient Instructions (Signed)
1. Postmenopausal bleeding Postmenopausal bleeding in June, no recurrence since then.  Probably associated with submucosal fibroids.  Thin endometrial line at 3.5 mm today.  Endometrial biopsy successfully performed, pathology pending.  Patient reassured.  Will observe if endometrial biopsy is benign.  2. Fibroids, submucosal 3 submucosal fibroid seen by sonohysterogram, measuring 2.2 cm, 1.7 cm and 1.5 cm.  If patient has a benign endometrial biopsy and no further postmenopausal bleeding, no indication to excise the fibroids at this time in her life.  Susan [name], it was a pleasure seeing you today!  I will inform you of your results as soon as they are available.

## 2017-09-02 ENCOUNTER — Other Ambulatory Visit: Payer: Self-pay

## 2017-09-02 DIAGNOSIS — M79672 Pain in left foot: Secondary | ICD-10-CM | POA: Diagnosis not present

## 2017-09-02 DIAGNOSIS — D2372 Other benign neoplasm of skin of left lower limb, including hip: Secondary | ICD-10-CM | POA: Diagnosis not present

## 2017-09-04 ENCOUNTER — Encounter: Payer: Self-pay | Admitting: Cardiology

## 2017-09-04 ENCOUNTER — Encounter: Payer: PPO | Admitting: Cardiology

## 2017-09-04 VITALS — BP 126/66 | HR 55 | Ht 62.0 in | Wt 177.1 lb

## 2017-09-04 DIAGNOSIS — I480 Paroxysmal atrial fibrillation: Secondary | ICD-10-CM

## 2017-09-04 NOTE — Progress Notes (Signed)
This encounter was created in error - please disregard.

## 2017-09-04 NOTE — Progress Notes (Deleted)
Cardiology Office Note:    Date:  09/04/2017   ID:  Susan [name], DOB Dec 29, 1940, MRN [id number]  PCP:  Susan Cranker, DO  Cardiologist:  Susan Him, MD    Referring MD: Susan Cranker, DO   No chief complaint on file.   History of Present Illness:    Susan [name] is a 77 y.o. 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   Past Medical History:  Diagnosis Date  . Anxiety   . Arthritis    "legs, back" (07/29/2012)  . Chronic diastolic (congestive) heart failure ([place])   . Chronic lower back pain   . Complication of anesthesia    "I have apnea" (07/29/2012)  . Dementia    per family  . Depression   . 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 Edema 02/09/2015  . Family history of anesthesia complication    "some wake up during OR; some are hard to wake up; some both" (07/29/2012)  . Gait abnormality 06/18/2016  . GERD (gastroesophageal reflux disease)   . 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 ([place])   . History of CVA (cerebrovascular accident) 02/01/2015  . History of stomach ulcers 1980's  . Hyperlipemia 06/18/2016  . Hyperlipidemia   . Hyperparathyroidism, primary ([place]) 04/22/2012  . Hypertensive heart disease   . Incontinent of urine    wears pads  . Major depressive disorder, recurrent episode, severe ([place]) 11/21/2015  . Medication management 10/11/2016  . OSA (obstructive sleep apnea)   . OSA on CPAP   . Osteoarthritis of right knee   . Pedal edema   . Persistent atrial fibrillation ([facility])    CHADS2VASC score is 7 - on chronic anticoagulation with Apixaban  . Primary hyperparathyroidism ([place])   . Short-term memory loss 01/28/2015  . Spinal stenosis   . Stroke ([place])   . Thyroid disease   . TIA (transient ischemic attack) 01/24/2015  . Umbilical hernia    "unrepaired" (07/29/2012)  . Varicose veins    "BLE" (07/29/2012)    Past Surgical History:  Procedure Laterality Date  . CATARACT EXTRACTION    . CHOLECYSTECTOMY  ~ 2010  . COLONOSCOPY    . IUD REMOVAL  1980's  . PARATHYROIDECTOMY N/A 06/16/2012   Procedure: NECK EXPLORATION AND LEFT SUPERIOR PARATHYROIDECTOMY;  Surgeon: Susan Regal, MD;  Location: [facility];  Service: General;  Laterality: N/A;  . 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 TOTAL KNEE ARTHROPLASTY Right 07/29/2012   Procedure: TOTAL KNEE ARTHROPLASTY;  Surgeon: Susan Lights, MD;  Location: [facility];  Service: Orthopedics;  Laterality: Right;    Current Medications: Current Meds  Medication Sig  . acetaminophen (TYLENOL) 500 MG tablet Take 1,000 mg by mouth daily as needed for moderate pain.   Marland Kitchen apixaban (ELIQUIS) 5 MG TABS tablet Take 1 tablet (5 mg total) by mouth every 12 (twelve) hours.  Marland Kitchen buPROPion (WELLBUTRIN XL) 150 MG 24 hr tablet Take 450 mg by mouth daily.  Marland Kitchen CALCIUM PO Take 1 tablet by mouth daily.  . carvedilol (COREG) 12.5 MG tablet Take 12.5 mg by mouth 2 (two) times daily with a meal.  . cholecalciferol (VITAMIN D) 1000 units tablet Take 1,000 Units by mouth daily.   . clobetasol ointment (TEMOVATE) 9.89 % Apply 1 application topically 2 (two) times  daily.  . docusate sodium (COLACE) 100 MG capsule Take 100 mg by mouth daily.  . furosemide (LASIX) 20 MG tablet Take 20 mg by mouth daily.  Marland Kitchen gabapentin (NEURONTIN) 300 MG capsule Take 300 mg by mouth 2 (two) times daily.   . hydrALAZINE (APRESOLINE) 50 MG tablet Take 1 tablet (50 mg total) by mouth 2 (two) times daily.  . iron polysaccharides (IFEREX 150) 150 MG capsule Take 150 mg by mouth daily.  Marland Kitchen losartan (COZAAR) 50 MG tablet Take 1 tablet (50 mg total) by mouth daily.  . Multiple Vitamin (MULTIVITAMIN WITH MINERALS) TABS Take 1 tablet by mouth daily.  . pravastatin (PRAVACHOL) 40 MG tablet Take 40 mg by mouth at bedtime.   . sertraline (ZOLOFT) 25 MG  tablet Take 0.5 tablets (12.5 mg total) by mouth 2 (two) times daily.  . vitamin B-12 (CYANOCOBALAMIN) 1000 MCG tablet Take 2,000 mcg by mouth daily.     Allergies:   Ace inhibitors; Lipitor [atorvastatin]; Simvastatin; and Latex   Social History   Socioeconomic History  . Marital status: Widowed    Spouse name: Not on file  . Number of children: Not on file  . Years of education: Not on file  . Highest education level: Not on file  Occupational History  . Occupation: retired    Fish farm manager: RETIRED  Social Needs  . Financial resource strain: Not on file  . Food insecurity:    Worry: Not on file    Inability: Not on file  . Transportation needs:    Medical: Not on file    Non-medical: Not on file  Tobacco Use  . Smoking status: Former Smoker    Packs/day: 1.00    Years: 30.00    Pack years: 30.00    Types: Cigarettes    Last attempt to quit: 01/07/1978    Years since quitting: 39.6  . Smokeless tobacco: Never Used  Substance and Sexual Activity  . Alcohol use: No  . Drug use: No  . Sexual activity: Not Currently    Comment: intercourse age 75,less than 5 secxual partners,  Lifestyle  . Physical activity:    Days per week: Not on file    Minutes per session: Not on file  . Stress: Not on file  Relationships  . Social connections:    Talks on phone: Not on file    Gets together: Not on file    Attends religious service: Not on file    Active member of club or organization: Not on file    Attends meetings of clubs or organizations: Not on file    Relationship status: Not on file  Other Topics Concern  . Not on file  Social History Narrative   Lives at home alone   Right-handed   Caffeine: 1 cup of coffee per day         Diet:      Caffeine:      Married, if yes what year:       Do you live in a house, apartment, assisted living, condo, trailer, ect:      Pets:      Current/Past profession:      Exercise:         Living Will: Yes   DNR: No    POA/HPOA: Yes      Functional Status:   Do you have difficulty bathing or dressing yourself? Yes   Do you have difficulty preparing food or eating? No   Do you have  difficulty managing your medications? Yes   Do you have difficulty managing your finances? Yes   Do you have difficulty affording your medications? Yes     Family History: The patient's ***family history includes Diabetes in her daughter; Hypertension in her daughter and mother; Lung cancer in her father; Stroke in her mother.  ROS:   Please see the history of present illness.    ROS  All other systems reviewed and negative.   EKGs/Labs/Other Studies Reviewed:    The following studies were reviewed today: ***  EKG:  EKG is *** ordered today.  The ekg ordered today demonstrates ***  Recent Labs: 02/26/2017: BUN 20; Creatinine, Ser 1.48; Potassium 3.9; Sodium 143 06/05/2017: Hemoglobin 8.9; Platelets 224   Recent Lipid Panel    Component Value Date/Time   CHOL 215 (H) 04/06/2015 1136   TRIG 120 04/06/2015 1136   HDL 63 04/06/2015 1136   CHOLHDL 3.4 04/06/2015 1136   CHOLHDL 4.0 01/25/2015 0408   VLDL 31 01/25/2015 0408   LDLCALC 128 (H) 04/06/2015 1136    Physical Exam:    VS:  BP 126/66   Pulse (!) 55   Ht 5\' 2"  (1.575 m)   Wt 177 lb 1.9 oz (80.3 kg)   BMI 32.40 kg/m     Wt Readings from Last 3 Encounters:  09/04/17 177 lb 1.9 oz (80.3 kg)  07/09/17 178 lb (80.7 kg)  07/01/17 174 lb (78.9 kg)     GEN: *** Well nourished, well developed in no acute distress HEENT: Normal NECK: No JVD; No carotid bruits LYMPHATICS: No lymphadenopathy CARDIAC: ***RRR, no murmurs, rubs, gallops RESPIRATORY:  Clear to auscultation without rales, wheezing or rhonchi  ABDOMEN: Soft, non-tender, non-distended MUSCULOSKELETAL:  No edema; No deformity  SKIN: Warm and dry NEUROLOGIC:  Alert and oriented x 3 PSYCHIATRIC:  Normal affect   ASSESSMENT:    No diagnosis found. PLAN:    In order of problems listed  above:  ***   Medication Adjustments/Labs and Tests Ordered: Current medicines are reviewed at length with the patient today.  Concerns regarding medicines are outlined above.  No orders of the defined types were placed in this encounter.  No orders of the defined types were placed in this encounter.   Signed, Susan Him, MD  09/04/2017 8:37 AM    [place]

## 2017-09-05 ENCOUNTER — Other Ambulatory Visit: Payer: PPO

## 2017-09-05 ENCOUNTER — Telehealth: Payer: Self-pay | Admitting: *Deleted

## 2017-09-05 DIAGNOSIS — E782 Mixed hyperlipidemia: Secondary | ICD-10-CM | POA: Diagnosis not present

## 2017-09-05 DIAGNOSIS — Z79899 Other long term (current) drug therapy: Secondary | ICD-10-CM | POA: Diagnosis not present

## 2017-09-05 DIAGNOSIS — D649 Anemia, unspecified: Secondary | ICD-10-CM | POA: Diagnosis not present

## 2017-09-05 LAB — COMPLETE METABOLIC PANEL WITH GFR
AG Ratio: 1.6 (calc) (ref 1.0–2.5)
ALKALINE PHOSPHATASE (APISO): 61 U/L (ref 33–130)
ALT: 14 U/L (ref 6–29)
AST: 21 U/L (ref 10–35)
Albumin: 3.9 g/dL (ref 3.6–5.1)
BILIRUBIN TOTAL: 0.3 mg/dL (ref 0.2–1.2)
BUN/Creatinine Ratio: 16 (calc) (ref 6–22)
BUN: 24 mg/dL (ref 7–25)
CHLORIDE: 109 mmol/L (ref 98–110)
CO2: 26 mmol/L (ref 20–32)
Calcium: 10.3 mg/dL (ref 8.6–10.4)
Creat: 1.46 mg/dL — ABNORMAL HIGH (ref 0.60–0.93)
GFR, Est African American: 39 mL/min/{1.73_m2} — ABNORMAL LOW (ref >=60)
GFR, Est Non African American: 34 mL/min/{1.73_m2} — ABNORMAL LOW (ref >=60)
GLUCOSE: 87 mg/dL (ref 65–99)
Globulin: 2.5 g/dL (calc) (ref 1.9–3.7)
Potassium: 4.2 mmol/L (ref 3.5–5.3)
Sodium: 142 mmol/L (ref 135–146)
TOTAL PROTEIN: 6.4 g/dL (ref 6.1–8.1)

## 2017-09-05 LAB — CBC WITH DIFFERENTIAL/PLATELET
BASOS ABS: 29 {cells}/uL (ref 0–200)
Basophils Relative: 1 %
EOS PCT: 4.8 %
Eosinophils Absolute: 139 cells/uL (ref 15–500)
HCT: 28.6 % — ABNORMAL LOW (ref 35.0–45.0)
HEMOGLOBIN: 9.6 g/dL — AB (ref 11.7–15.5)
Lymphs Abs: 699 cells/uL — ABNORMAL LOW (ref 850–3900)
MCH: 29.9 pg (ref 27.0–33.0)
MCHC: 33.6 g/dL (ref 32.0–36.0)
MCV: 89.1 fL (ref 80.0–100.0)
MONOS PCT: 15.3 %
MPV: 10.3 fL (ref 7.5–12.5)
NEUTROS ABS: 1589 {cells}/uL (ref 1500–7800)
Neutrophils Relative %: 54.8 %
Platelets: 229 10*3/uL (ref 140–400)
RBC: 3.21 10*6/uL — ABNORMAL LOW (ref 3.80–5.10)
RDW: 15.1 % — AB (ref 11.0–15.0)
Total Lymphocyte: 24.1 %
WBC mixed population: 444 cells/uL (ref 200–950)
WBC: 2.9 10*3/uL — ABNORMAL LOW (ref 3.8–10.8)

## 2017-09-05 LAB — URINALYSIS, ROUTINE W REFLEX MICROSCOPIC
BILIRUBIN URINE: NEGATIVE
Bacteria, UA: NONE SEEN /HPF
GLUCOSE, UA: NEGATIVE
Hgb urine dipstick: NEGATIVE
Hyaline Cast: NONE SEEN /LPF
Ketones, ur: NEGATIVE
NITRITE: NEGATIVE
PROTEIN: NEGATIVE
Specific Gravity, Urine: 1.013 (ref 1.001–1.03)
Squamous Epithelial / LPF: NONE SEEN /HPF (ref <=5)
WBC UA: NONE SEEN /HPF (ref 0–5)
pH: <=5 (ref 5.0–8.0)

## 2017-09-05 LAB — TSH: TSH: 1.95 mIU/L (ref 0.40–4.50)

## 2017-09-05 LAB — LIPID PANEL
CHOL/HDL RATIO: 3.1 (calc) (ref <5.0)
Cholesterol: 199 mg/dL (ref <200)
HDL: 65 mg/dL (ref >50)
LDL CHOLESTEROL (CALC): 117 mg/dL — AB
NON-HDL CHOLESTEROL (CALC): 134 mg/dL — AB (ref <130)
TRIGLYCERIDES: 73 mg/dL (ref <150)

## 2017-09-05 NOTE — Telephone Encounter (Signed)
-----   Message from Sueanne Margarita, MD sent at 09/04/2017  3:31 PM EDT ----- Good AHI on PAP but needs to improve compliance

## 2017-09-05 NOTE — Telephone Encounter (Signed)
Informed patient of compliance results and verbalized understanding was indicated. Patient is aware and agreeable to AHI being within range at 0.4. Patient is aware and agreeable to being in compliance with machine usage. Patient is aware and agreeable to no change in current pressures. 

## 2017-09-09 ENCOUNTER — Encounter: Payer: Self-pay | Admitting: Internal Medicine

## 2017-09-09 ENCOUNTER — Ambulatory Visit (INDEPENDENT_AMBULATORY_CARE_PROVIDER_SITE_OTHER): Payer: PPO | Admitting: Internal Medicine

## 2017-09-09 VITALS — BP 118/74 | HR 67 | Temp 98.7°F | Ht 62.0 in | Wt 176.4 lb

## 2017-09-09 DIAGNOSIS — I69351 Hemiplegia and hemiparesis following cerebral infarction affecting right dominant side: Secondary | ICD-10-CM

## 2017-09-09 DIAGNOSIS — E782 Mixed hyperlipidemia: Secondary | ICD-10-CM

## 2017-09-09 DIAGNOSIS — S81802D Unspecified open wound, left lower leg, subsequent encounter: Secondary | ICD-10-CM | POA: Diagnosis not present

## 2017-09-09 DIAGNOSIS — R2681 Unsteadiness on feet: Secondary | ICD-10-CM | POA: Diagnosis not present

## 2017-09-09 DIAGNOSIS — Z1231 Encounter for screening mammogram for malignant neoplasm of breast: Secondary | ICD-10-CM | POA: Diagnosis not present

## 2017-09-09 DIAGNOSIS — F0150 Vascular dementia without behavioral disturbance: Secondary | ICD-10-CM

## 2017-09-09 DIAGNOSIS — N939 Abnormal uterine and vaginal bleeding, unspecified: Secondary | ICD-10-CM

## 2017-09-09 DIAGNOSIS — I5032 Chronic diastolic (congestive) heart failure: Secondary | ICD-10-CM

## 2017-09-09 DIAGNOSIS — I481 Persistent atrial fibrillation: Secondary | ICD-10-CM | POA: Diagnosis not present

## 2017-09-09 DIAGNOSIS — I4819 Other persistent atrial fibrillation: Secondary | ICD-10-CM

## 2017-09-09 DIAGNOSIS — Z8673 Personal history of transient ischemic attack (TIA), and cerebral infarction without residual deficits: Secondary | ICD-10-CM | POA: Diagnosis not present

## 2017-09-09 DIAGNOSIS — F332 Major depressive disorder, recurrent severe without psychotic features: Secondary | ICD-10-CM | POA: Diagnosis not present

## 2017-09-09 DIAGNOSIS — Z79899 Other long term (current) drug therapy: Secondary | ICD-10-CM

## 2017-09-09 DIAGNOSIS — Z1239 Encounter for other screening for malignant neoplasm of breast: Secondary | ICD-10-CM

## 2017-09-09 MED ORDER — TETANUS-DIPHTH-ACELL PERTUSSIS 5-2.5-18.5 LF-MCG/0.5 IM SUSP: 0.5000 mL | Freq: Once | INTRAMUSCULAR | 0 refills | Status: AC

## 2017-09-09 MED ORDER — DONEPEZIL HCL 5 MG PO TABS: 5.0000 mg | ORAL_TABLET | Freq: Every day | ORAL | 6 refills | Status: DC

## 2017-09-09 NOTE — Patient Instructions (Addendum)
START ARICEPT (DONEPEZIL) 5MG  AT BEDTIME FOR MEMORY LOSS  USE WALKER AT ALL TIMES for better stability and reduce falls  CONTINUE EXERCISE AS TOLERATED  Get name of back specialist as you need to follow up with them  Will call with wound clinic referral  Follow up in 1 month for dementia  Keeping You Healthy  Get These Tests  Blood Pressure- Have your blood pressure checked by your healthcare provider at least once a year.  Normal blood pressure is 120/80.  Weight- Have your body mass index (BMI) calculated to screen for obesity.  BMI is a measure of body fat based on height and weight.  You can calculate your own BMI at [employer]  Cholesterol- Have your cholesterol checked every year.  Diabetes- Have your blood sugar checked every year if you have high blood pressure, high cholesterol, a family history of diabetes or if you are overweight.  Pap Test - Have a pap test every 1 to 5 years if you have been sexually active.  If you are older than 65 and recent pap tests have been normal you may not need additional pap tests.  In addition, if you have had a hysterectomy  for benign disease additional pap tests are not necessary.  Mammogram-Yearly mammograms are essential for early detection of breast cancer  Screening for Colon Cancer- Colonoscopy starting at age 54. Screening may begin sooner depending on your family history and other health conditions.  Follow up colonoscopy as directed by your Gastroenterologist.  Screening for Osteoporosis- Screening begins at age 87 with bone density scanning, sooner if you are at higher risk for developing Osteoporosis.  Get these medicines  Calcium with Vitamin D- Your body requires 1200-1500 mg of Calcium a day and [phone] IU of Vitamin D a day.  You can only absorb 500 mg of Calcium at a time therefore Calcium must be taken in 2 or 3 separate doses throughout the day.  Hormones- Hormone therapy has been associated with  increased risk for certain cancers and heart disease.  Talk to your healthcare provider about if you need relief from menopausal symptoms.  Aspirin- Ask your healthcare provider about taking Aspirin to prevent Heart Disease and Stroke.  Get these Immuniztions  Flu shot- Every fall  Pneumonia shot- Once after the age of 71; if you are younger ask your healthcare provider if you need a pneumonia shot.  Tetanus- Every ten years.  Zostavax- Once after the age of 35 to prevent shingles.  Take these steps  Don't smoke- Your healthcare provider can help you quit. For tips on how to quit, ask your healthcare provider or go to www.smokefree.gov or call 1-800 QUIT-NOW.  Be physically active- Exercise 5 days a week for a minimum of 30 minutes.  If you are not already physically active, start slow and gradually work up to 30 minutes of moderate physical activity.  Try walking, dancing, bike riding, swimming, etc.  Eat a healthy diet- Eat a variety of healthy foods such as fruits, vegetables, whole grains, low fat milk, low fat cheeses, yogurt, lean meats, chicken, fish, eggs, dried beans, tofu, etc.  For more information go to www.thenutritionsource.org  Dental visit- Brush and floss teeth twice daily; visit your dentist twice a year.  Eye exam- Visit your Optometrist or Ophthalmologist yearly.  Drink alcohol in moderation- Limit alcohol intake to one drink or less a day.  Never drink and drive.  Depression- Your emotional health is as important as your physical health.  If you're feeling down or losing interest in things you normally enjoy, please talk to your healthcare provider.  Seat Belts- can save your life; always wear one  Smoke/Carbon Monoxide detectors- These detectors need to be installed on the appropriate level of your home.  Replace batteries at least once a year.  Violence- If anyone is threatening or hurting you, please tell your healthcare provider.  Living Will/ Health care  power of attorney- Discuss with your healthcare provider and family.

## 2017-09-09 NOTE — Progress Notes (Signed)
Patient ID: Susan [name], female   DOB: 10-26-1940, 77 y.o.   MRN: [id number]   Location:  [facility]  Place of Service:  OFFICE  Provider: Arletha Grippe, DO  Patient Care Team: Gildardo Cranker, DO as PCP - General (Internal Medicine) Sueanne Margarita, MD as PCP - Cardiology (Cardiology)  Extended Emergency Contact Information Primary Emergency Contact: Hildebran,[name]          [address] [place], [place] [address] [place] Phone: [phone] Work Phone: [phone] Mobile Phone: [phone] Relation: Daughter  Code Status:  Goals of Care: Advanced Directive information Advanced Directives 07/01/2017  Does Patient Have a Medical Advance Directive? No  Type of Advance Directive -  Does patient want to make changes to medical advance directive? -  Copy of [place] in Chart? -  Would patient like information on creating a medical advance directive? -  Pre-existing out of facility DNR order (yellow form or pink MOST form) -     Chief Complaint  Patient presents with  . Medical Management of Chronic Issues    Pt is being seen for an extended visit.   . Depression    Score of 11  . MMSE    29/30: yf    HPI: Patient is a 77 y.o. female seen in today for an extended visit. MMSE 29/30. She reports feeling tired today. She feels depressed on 3 meds. She had some vaginal bleeding that has stopped. She saw GYN and underwent endometrial bx that revealed benign findings. She has weakness in LLE and noticed a rash. [facility] followed wound x 3 but she has not seen them in several weeks (only allowed 3 visits). LLE venous doppler neg DVT in Apr 2019. She tries to wear TED stockings but finds it difficult to get them on and off. She has cough with occasional mucous production x several mos. She has recurrent falls but none since last ov. She has tingling in left hand. Pt with increasing memory issues. Pt is a poor historian due to memory loss. Hx obtained from chart and  daughter by phone  Chronic diastolic HF/persistent afib/HTN - followed by cardio Dr Radford Pax. CHADs2VASC score 7. She takes eliquis for anticoagulation. 2D echo in 04/2015 showed EF 55-60% (prev 30-35%).  OSA - she uses CPAP qHS but only uses it for 4 hrs with compliance of 12% per cardio Feb 2019 ov. Followed by cardio Dr Radford Pax  Anxiety/depression - she is on 3 meds. 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  Chronic back pain - she was supposed to have surgery but had a stroke and was unable to proceed with sx.   Depression screen [facility] 2/9 09/09/2017 05/10/2016 05/09/2016  Decreased Interest 0 - 3  Down, Depressed, Hopeless 2 - 3  PHQ - 2 Score 2 - 6  Altered sleeping 0 (No Data) -  Tired, decreased energy 3 - 3  Change in appetite 0 - -  Feeling bad or failure about yourself  2 - -  Trouble concentrating 1 - -  Moving slowly or fidgety/restless 3 - 3  Suicidal thoughts 0 0 -  PHQ-9 Score 11 - -  Difficult doing work/chores Somewhat difficult - -    Fall Risk  09/09/2017 07/01/2017 06/05/2017 05/16/2017 05/09/2016  Falls in the past year? Yes Yes Yes Yes Yes  Number falls in past yr:  1 2 or more 2 or more 2 or more 1  Injury with Fall? No No Yes No No  Comment - - Injured right shoulder  - -  Risk for fall due to : - - - - History of fall(s)  Follow up - - - - (No Data)  Comment - - - - Referring to SW, pt agreed.   MMSE - Mini Mental State Exam 09/09/2017  Orientation to time 5  Orientation to Place 5  Registration 3  Attention/ Calculation 5  Recall 3  Language- name 2 objects 2  Language- repeat 1  Language- follow 3 step command 3  Language- read & follow direction 1  Write a sentence 1  Copy design 0  Total score 29     Health Maintenance  Topic Date Due  . DEXA SCAN  01/22/2003  . PNA vac Low Risk Adult (2 of 2 - PCV13) 01/07/2013  . INFLUENZA VACCINE  11/06/2017  (Originally 08/07/2017)  . TETANUS/TDAP  11/06/2017 (Originally 01/21/1959)    Urinary incontinence? Yes - wears pantyliner  Functional Status Survey: Is the patient deaf or have difficulty hearing?: Yes Does the patient have difficulty seeing, even when wearing glasses/contacts?: No Does the patient have difficulty concentrating, remembering, or making decisions?: Yes Does the patient have difficulty walking or climbing stairs?: Yes Does the patient have difficulty dressing or bathing?: No Does the patient have difficulty doing errands alone such as visiting a doctor's office or shopping?: Yes  Ex[facility]; group exercise 1-2 times per week  Diet? Maintains healthy food choices  No exam data present  Hearing: some issues    Dentition: she does not have a regular dentist due to insurance issues  Pain: back and knees  Past Medical History:  Diagnosis Date  . Anxiety   . Arthritis    "legs, back" (07/29/2012)  . Chronic diastolic (congestive) heart failure ([place])   . Chronic lower back pain   . Complication of anesthesia    "I have apnea" (07/29/2012)  . Dementia    per family  . Depression   . 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 Edema 02/09/2015  . Family history of anesthesia complication    "some wake up during OR; some are hard to wake up; some both" (07/29/2012)  . Gait abnormality 06/18/2016  . GERD (gastroesophageal reflux disease)   . 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 ([place])   . History of CVA (cerebrovascular accident) 02/01/2015  . History of stomach ulcers 1980's  . Hyperlipemia 06/18/2016  . Hyperlipidemia   . Hyperparathyroidism, primary ([facility]) 04/22/2012  . Hypertensive heart disease   . Incontinent of urine    wears pads  . Major depressive disorder, recurrent episode, severe ([place]) 11/21/2015  . Medication management 10/11/2016  . OSA (obstructive sleep apnea)   . OSA on CPAP   .  Osteoarthritis of right knee   . Pedal edema   . Persistent atrial fibrillation (H[facility])    CHADS2VASC score is 7 - on chronic anticoagulation with Apixaban  . Primary hyperparathyroidism ([place])   . Short-term memory loss 01/28/2015  . Spinal stenosis   . Stroke ([place])   . Thyroid disease   . TIA (transient ischemic attack) 01/24/2015  . Umbilical hernia    "unrepaired" (07/29/2012)  . Varicose veins    "BLE" (07/29/2012)    Past Surgical History:  Procedure Laterality Date  . CATARACT EXTRACTION    .  CHOLECYSTECTOMY  ~ 2010  . COLONOSCOPY    . IUD REMOVAL  1980's  . PARATHYROIDECTOMY N/A 06/16/2012   Procedure: NECK EXPLORATION AND LEFT SUPERIOR PARATHYROIDECTOMY;  Surgeon: Earnstine Regal, MD;  Location: [facility];  Service: General;  Laterality: N/A;  . 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 TOTAL KNEE ARTHROPLASTY Right 07/29/2012   Procedure: TOTAL KNEE ARTHROPLASTY;  Surgeon: Ninetta Lights, MD;  Location: [facility];  Service: Orthopedics;  Laterality: Right;    Family History  Problem Relation Age of Onset  . Hypertension Mother        deceased  . Stroke Mother   . Lung cancer Father        deceased  . Diabetes Daughter   . Hypertension Daughter    Family Status  Relation Name Status  . Mother [name] Deceased  . Father Gwyndolyn Saxon Deceased  . Sister Merita Norton  . Brother [employer]  . Daughter Cherlyn Cushing  . Daughter Evie Lacks  . Daughter [name] Alive    shoulder  Social History   Socioeconomic History  . Marital status: Widowed    Spouse name: Not on file  . Number of children: Not on file  . Years of education: Not on file  . Highest education level: Not on file  Occupational History  . Occupation: retired    Fish farm manager: RETIRED  Social Needs  . Financial resource strain: Not on file  . Food insecurity:    Worry: Not on file    Inability: Not on file  . Transportation needs:    Medical: Not on file    Non-medical: Not on file  Tobacco Use  . Smoking  status: Former Smoker    Packs/day: 1.00    Years: 30.00    Pack years: 30.00    Types: Cigarettes    Last attempt to quit: 01/07/1978    Years since quitting: 39.6  . Smokeless tobacco: Never Used  Substance and Sexual Activity  . Alcohol use: No  . Drug use: No  . Sexual activity: Not Currently    Comment: intercourse age 63,less than 5 secxual partners,  Lifestyle  . Physical activity:    Days per week: Not on file    Minutes per session: Not on file  . Stress: Not on file  Relationships  . Social connections:    Talks on phone: Not on file    Gets together: Not on file    Attends religious service: Not on file    Active member of club or organization: Not on file    Attends meetings of clubs or organizations: Not on file    Relationship status: Not on file  . Intimate partner violence:    Fear of current or ex partner: Not on file    Emotionally abused: Not on file    Physically abused: Not on file    Forced sexual activity: Not on file  Other Topics Concern  . Not on file  Social History Narrative   Lives at home alone   Right-handed   Caffeine: 1 cup of coffee per day         Diet:      Caffeine:      Married, if yes what year:       Do you live in a house, apartment, assisted living, condo, trailer, ect:      Pets:      Current/Past profession:      Exercise:  Living Will: Yes   DNR: No   POA/HPOA: Yes      Functional Status:   Do you have difficulty bathing or dressing yourself? Yes   Do you have difficulty preparing food or eating? No   Do you have difficulty managing your medications? Yes   Do you have difficulty managing your finances? Yes   Do you have difficulty affording your medications? Yes    Allergies  Allergen Reactions  . Ace Inhibitors Cough  . Lipitor [Atorvastatin] Swelling  . Simvastatin Other (See Comments)    Myalgias     . Latex Itching    Allergies as of 09/09/2017      Reactions   Ace Inhibitors Cough    Lipitor [atorvastatin] Swelling   Simvastatin Other (See Comments)   Myalgias    Latex Itching      Medication List        Accurate as of 09/09/17 10:11 AM. Always use your most recent med list.          acetaminophen 500 MG tablet Commonly known as:  TYLENOL Take 1,000 mg by mouth daily as needed for moderate pain.   apixaban 5 MG Tabs tablet Commonly known as:  ELIQUIS Take 1 tablet (5 mg total) by mouth every 12 (twelve) hours.   buPROPion 150 MG 24 hr tablet Commonly known as:  WELLBUTRIN XL Take 450 mg by mouth daily.   CALCIUM PO Take 1 tablet by mouth daily.   carvedilol 12.5 MG tablet Commonly known as:  COREG Take 12.5 mg by mouth 2 (two) times daily with a meal.   cholecalciferol 1000 units tablet Commonly known as:  VITAMIN D Take 1,000 Units by mouth daily.   clobetasol ointment 0.05 % Commonly known as:  TEMOVATE Apply 1 application topically 2 (two) times daily.   docusate sodium 100 MG capsule Commonly known as:  COLACE Take 100 mg by mouth daily.   furosemide 20 MG tablet Commonly known as:  LASIX Take 20 mg by mouth daily.   gabapentin 300 MG capsule Commonly known as:  NEURONTIN Take 300 mg by mouth 2 (two) times daily.   hydrALAZINE 50 MG tablet Commonly known as:  APRESOLINE Take 1 tablet (50 mg total) by mouth 2 (two) times daily.   IFEREX 150 150 MG capsule Generic drug:  iron polysaccharides Take 150 mg by mouth daily.   losartan 50 MG tablet Commonly known as:  COZAAR Take 1 tablet (50 mg total) by mouth daily.   multivitamin with minerals Tabs tablet Take 1 tablet by mouth daily.   pravastatin 40 MG tablet Commonly known as:  PRAVACHOL Take 40 mg by mouth at bedtime.   sertraline 25 MG tablet Commonly known as:  ZOLOFT Take 0.5 tablets (12.5 mg total) by mouth 2 (two) times daily.   Tdap 5-2.5-18.5 LF-MCG/0.5 injection Commonly known as:  BOOSTRIX Inject 0.5 mLs into the muscle once for 1 dose.   vitamin B-12 1000  MCG tablet Commonly known as:  CYANOCOBALAMIN Take 2,000 mcg by mouth daily.        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  Constitutional: Positive for fatigue.  Genitourinary: Negative for vaginal bleeding.  Musculoskeletal: Positive for arthralgias and back pain.  Psychiatric/Behavioral: Positive for dysphoric mood.  All other systems reviewed and are negative.   Physical Exam: Vitals:   09/09/17 0922  BP: 118/74  Pulse: 67  Temp: 98.7  F (37.1 C)  TempSrc: Oral  SpO2: 97%  Weight: 176 lb 6.4 oz (80 kg)  Height: 5\' 2"  (1.575 m)   Body mass index is 32.26 kg/m. Physical Exam  Constitutional: She is oriented to person, place, and time. She appears well-developed and well-nourished. No distress.  HENT:  Head: Normocephalic and atraumatic.  Right Ear: Hearing, tympanic membrane, external ear and ear canal normal.  Left Ear: Hearing, tympanic membrane, external ear and ear canal normal.  Mouth/Throat: Uvula is midline, oropharynx is clear and moist and mucous membranes are normal. She does not have dentures.  Eyes: Pupils are equal, round, and reactive to light. Conjunctivae, EOM and lids are normal. No scleral icterus.  Neck: Trachea normal and normal range of motion. Neck supple. Carotid bruit is not present. No thyroid mass and no thyromegaly present.  Cardiovascular: Normal rate and intact distal pulses. An irregularly irregular rhythm present. Exam reveals no gallop and no friction rub.  Murmur (1/6 SEM) heard. +1 pitting L>RLE edema; no calf TTP; chronic venous stasis changes b/l LE   Pulmonary/Chest: Effort normal and breath sounds normal. She has no wheezes. She has no rhonchi. She has no rales. She exhibits no mass. Right breast exhibits no inverted nipple, no mass, no nipple discharge, no skin change and no tenderness. Left breast exhibits tenderness (upper outer with palpable easily compressible  cyst). Left breast exhibits no inverted nipple, no mass, no nipple discharge and no skin change. Breasts are symmetrical.  Abdominal: Soft. Normal appearance, normal aorta and bowel sounds are normal. She exhibits no pulsatile midline mass and no mass. There is hepatosplenomegaly. There is tenderness in the left lower quadrant. There is no rigidity, no rebound and no guarding. No hernia.  Genitourinary:  Genitourinary Comments: Deferred to GYN  Musculoskeletal: Normal range of motion. She exhibits edema and deformity (distal left medial ankle).  Lymphadenopathy:       Head (right side): No posterior auricular adenopathy present.       Head (left side): No posterior auricular adenopathy present.    She has no cervical adenopathy.       Right: No supraclavicular adenopathy present.       Left: No supraclavicular adenopathy present.  Neurological: She is alert and oriented to person, place, and time. She has normal strength and normal reflexes. No cranial nerve deficit. Gait normal.  Skin: Skin is warm, dry and intact. No rash noted. Nails show no clubbing.  Medial left ankle with tiny wound with hard scab, TTP, no redness or d/c; right anterior shoulder large freely mobile lipoma, NT  Psychiatric: She has a normal mood and affect. Her speech is normal and behavior is normal. Thought content normal. Cognition and memory are normal.    Labs reviewed:  Basic Metabolic Panel: Recent Labs    02/20/17 1101 02/26/17 0844 09/05/17 0803  NA 142 143 142  K 4.0 3.9 4.2  CL 106 107* 109  CO2 23 22 26   GLUCOSE 93 108* 87  BUN 26 20 24   CREATININE 1.45* 1.48* 1.46*  CALCIUM 10.0 10.1 10.3  TSH  --   --  1.95   Liver Function Tests: Recent Labs    09/05/17 0803  AST 21  ALT 14  BILITOT 0.3  PROT 6.4   No results for input(s): LIPASE, AMYLASE in the last 8760 hours. No results for input(s): AMMONIA in the last 8760 hours. CBC: Recent Labs    02/20/17 1101 06/05/17 1413 09/05/17 0803    WBC  4.1 3.5* 2.9*  NEUTROABS  --  2,090 1,589  HGB 9.3* 8.9* 9.6*  HCT 29.3* 27.4* 28.6*  MCV 82 85.4 89.1  PLT 297 224 229   Lipid Panel: Recent Labs    09/05/17 0803  CHOL 199  HDL 65  LDLCALC 117*  TRIG 73  CHOLHDL 3.1   Lab Results  Component Value Date   HGBA1C 6.1 (H) 01/25/2015    Procedures: [place] Sonohysterogram  Result Date: 08/31/2017 The initial transvaginal ultrasound, done on 07/09/2017, demonstrated the following:  T/V and T/a images. Anteverted uterus measuring 8.13 x 7.93 x 4.38 cm. Unable to identify the endometrial line.Intramural fibroids measuring 3.6 x 3.2 cm, 2.0 x 2.4 cm, 1 cm, 1.9 x 2.0 cm, 2.0 x 2.0 cm, 2.0 x 2.0 cm. Subserous right fibroid at the fundus measuring 4.6 x 3.3 cm. Right and left ovary not seen. No free fluid in the posterior cul-de-sac.   The speculum  was inserted and the cervix cleansed with Betadine solution after confirming that patient has no allergies.A small sonohysterography catheterwas utilized.  Insertion was facilitated with ring forceps, using a spear-like motion the catheter was inserted to the fundus of the uterus. The speculum is then removed carefully to avoid dislodging the catheter. The catheter was flushed with sterile saline delete prior to insertion to rid it of small amounts of air.the sterile saline solution was infused into the uterine cavity as a vaginal ultrasound probe was then placed in the vagina for full visualization of the uterine cavity from a transvaginal approach. The following was noted:  Following injection of saline the endometrial cavity is filled and defects are seen corresponding to a submucosal fibroids measured at 2.2 x 1.3 cm, 1.5 x 1.5 cm, 1.7 x 1.5 cm.  The endometrial lining is measured at a total of 3.5 mm (anterior at 1.8 mm and posterior at 1.7 mm)  The catheter was then removed after retrieving some of the saline from the intrauterine cavity. An endometrial biopsy was done. Patient tolerated  procedure well. She had received a tablet of Aleve for discomfort.     Assessment/Plan   ICD-10-CM   1. Wound of left lower extremity, subsequent encounter S81.802D Ambulatory referral to Wound Clinic  2. Vascular dementia without behavioral disturbance F01.50 donepezil (ARICEPT) 5 MG tablet   MMSE 28/30  3. Severe episode of recurrent major depressive disorder, without psychotic features ([place]) F33.2   4. High risk medication use Z79.899   5. Unsteady gait R26.81   6. Chronic diastolic (congestive) heart failure (HCC) I50.32   7. Persistent atrial fibrillation ([facility]CC) I48.1   8. Mixed hyperlipidemia E78.2   9. 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 History of CVA (cerebrovascular accident) Z1.73   11. Vaginal bleeding N93.9   12. Breast cancer screening Z12.31 MM 3D SCREEN BREAST BILATERAL    CANCELED: MM DIGITAL SCREENING BILATERAL    Will need influenza vaccine next ov  Lab results from 09/05/17 reviewed with pt and daughter, Olivia Mackie, on phone  START ARICEPT (DONEPEZIL) 5MG  AT BEDTIME FOR MEMORY LOSS  USE WALKER AT ALL TIMES for better stability and reduce falls  CONTINUE EXERCISE AS TOLERATED  Get name of back specialist as you need to follow up with them  Will call with wound clinic referral  Follow up in 1 month for dementia  Keeping You Healthy handout given    Cordella Register. Eulas Post, D. O., F. [place]  [facility] [address] [place], [place] [address] [phone] Cell (Monday-Friday 8 AM - 5 PM) [phone] After 5 PM and follow prompts

## 2017-09-10 ENCOUNTER — Telehealth: Payer: Self-pay

## 2017-09-10 NOTE — Telephone Encounter (Signed)
Patient called stating she was seen yesterday and gave Dr.Carter a handicap placard to fill out and the original was never given back to her.  Per Dr.Carter I need to check with Dee/CMA and Terry/CMA  Coralyn Mark is out of office, I spoke with [facility]. Per [facility] patient was given original along with her AVS (after visit summary)  I called patient back and left message informing her to check papers given at the end of visit and to call if she is still unable to locate and we will fill out a new one and mail to her

## 2017-09-11 ENCOUNTER — Encounter: Payer: Self-pay | Admitting: Internal Medicine

## 2017-09-11 ENCOUNTER — Telehealth: Payer: Self-pay | Admitting: *Deleted

## 2017-09-11 NOTE — Telephone Encounter (Signed)
Patient called and stated that Dr. Eulas Post told her to call [facility] to set herself up a Mammogram. They told her that patient would need an order from Provider since a cyst was felt on examination.  I reviewed previous OV note and an order was placed. Awaiting appointment.

## 2017-09-12 NOTE — Telephone Encounter (Signed)
Patient returned call, after checking paperwork at home patient states she was never given the original copy of handicap placard.  Patient aware I will complete another Handicap placard. Patient would like placard mailed to her ASAP. Patient aware the earliest Dr.Carter will be back in office is Tuesday

## 2017-09-14 ENCOUNTER — Encounter: Payer: Self-pay | Admitting: Internal Medicine

## 2017-09-14 NOTE — Telephone Encounter (Signed)
Note - pt does have dementia. Will complete new placard when availabale

## 2017-09-15 NOTE — Telephone Encounter (Signed)
Handicap Placard completed and placed in Dr.Carter's urgent, review and sign folder for Tuesday 09/16/17.   Addressed enevlope attached to form  S.Chrae B/CMA

## 2017-09-16 ENCOUNTER — Telehealth: Payer: Self-pay

## 2017-09-16 NOTE — Telephone Encounter (Signed)
Patient called asking to speak with Caren Griffins. Patient states she is returning Cynthia's call. Patient aware Caren Griffins is out of office. Patient states she thinks Caren Griffins was calling because Dr.Carter requested that she have an office visit.  I offered to schedule visit, I asked patient what was the reason for visit and she stated she does not know. Patient said it would be ok if Dr.Carter or Caren Griffins called her back to let her know why she needs to be seen.  Please advise

## 2017-09-16 NOTE — Telephone Encounter (Signed)
Per Dr.Carter, patient may be inquiring about wound care appointment.  Refer to Wound Care referral

## 2017-09-17 NOTE — Telephone Encounter (Signed)
Patient is calling wanting to know if her Handicap Placard has been signed and mailed. Please Advise.

## 2017-09-17 NOTE — Telephone Encounter (Signed)
Already addressed

## 2017-09-23 ENCOUNTER — Ambulatory Visit: Payer: PPO | Admitting: Internal Medicine

## 2017-09-25 ENCOUNTER — Ambulatory Visit (INDEPENDENT_AMBULATORY_CARE_PROVIDER_SITE_OTHER): Payer: PPO | Admitting: Family

## 2017-09-25 ENCOUNTER — Encounter: Payer: Self-pay | Admitting: Family

## 2017-09-25 VITALS — BP 104/70 | HR 51 | Temp 97.9°F | Ht 62.0 in | Wt 175.0 lb

## 2017-09-25 DIAGNOSIS — M25562 Pain in left knee: Secondary | ICD-10-CM | POA: Diagnosis not present

## 2017-09-25 MED ORDER — MENTHOL (TOPICAL ANALGESIC) 4 % EX GEL: 3.0000 [oz_av] | Freq: Three times a day (TID) | CUTANEOUS | 3 refills | Status: DC

## 2017-09-25 MED ORDER — ACETAMINOPHEN 500 MG PO TABS: 1000.0000 mg | ORAL_TABLET | Freq: Four times a day (QID) | ORAL | 0 refills | Status: DC | PRN

## 2017-09-25 NOTE — Patient Instructions (Signed)
1.  Extra strength Tylenol 500 mg tablet Take 1000 mg ( 2 tablets) by mouth twice daily in the morning and evening and 500 mg( 1 tablet) daily at 2 Pm for knee pain   2. Apply Biofreeze gel to left knee three times daily for pain   3. Please get X-ray done at [facility] for left knee pain to rule out fracture or Arthritis.   4. Notify provider's office if left knee pain worsen  5. Fall and safety precautions.Use walker at all times when walking.

## 2017-09-25 NOTE — Progress Notes (Signed)
Provider: Elia Nunley FNP-C  Gildardo Cranker, DO  Patient Care Team: Gildardo Cranker, DO as PCP - General (Internal Medicine) Sueanne Margarita, MD as PCP - Cardiology (Cardiology) Dannielle Karvonen, RN as [facility] Management  Extended Emergency Contact Information Primary Emergency Contact: [place], [place] [address] Johnnette Litter of [place] Phone: [phone] Work Phone: [phone] Mobile Phone: [phone] Relation: Daughter   Goals of care: Advanced Directive information Advanced Directives 07/01/2017  Does Patient Have a Medical Advance Directive? No  Type of Advance Directive -  Does patient want to make changes to medical advance directive? -  Copy of [place] in Chart? -  Would patient like information on creating a medical advance directive? -  Pre-existing out of facility DNR order (yellow form or pink MOST form) -     Chief Complaint  Patient presents with  . Acute Visit    Left knee pain x 2 weeks, pain getting worse. + fall risk   . Immunizations    [place] vaccine at [place] fac[place]"    HPI:  Pt is a 77 y.o. female seen today at [facility] office for an acute visit for evaluation of worsening of left knee pain and swelling for the past two weeks.she states pain worse with strengthening the leg.she has Tylenol as needed but has not taken any today.Spoke with patient's grand daughter who is the care giver.she states patient has a history of falls.Patient tells the last time she fell was 2-3 months ago had to get up off the floor on her knees.she does have a significant medical history of Osteoarthritis.she denies any fever,chills or injury to knee.  She is due for a flu shot but states will receive shot at the [facility] facility where she resides.     Past Medical History:  Diagnosis Date  . Anxiety   . Arthritis    "legs, back" (07/29/2012)  . Chronic diastolic  (congestive) heart failure ([place])   . Chronic lower back pain   . Complication of anesthesia    "I have apnea" (07/29/2012)  . Dementia    per family  . Depression   . 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 Edema 02/09/2015  . Family history of anesthesia complication    "some wake up during OR; some are hard to wake up; some both" (07/29/2012)  . Gait abnormality 06/18/2016  . GERD (gastroesophageal reflux disease)   . 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 ([place])   . History of CVA (cerebrovascular accident) 02/01/2015  . History of stomach ulcers 1980's  . Hyperlipemia 06/18/2016  . Hyperlipidemia   . Hyperparathyroidism, primary ([place]) 04/22/2012  . Hypertensive heart disease   . Incontinent of urine    wears pads  . Major depressive disorder, recurrent episode, severe ([place]) 11/21/2015  . Medication management 10/11/2016  . OSA (obstructive sleep apnea)   . OSA on CPAP   . Osteoarthritis of right knee   . Pedal edema   . Persistent atrial fibrillation (HCC)    CHADS2VASC score is 7 - on chronic anticoagulation with Apixaban  . Primary hyperparathyroidism ([place])   . Short-term memory loss 01/28/2015  . Spinal stenosis   . Stroke ([place])   . Thyroid disease   . TIA (transient ischemic attack) 01/24/2015  . Umbilical hernia    "unrepaired" (07/29/2012)  . Varicose veins    "  BLE" (07/29/2012)   Past Surgical History:  Procedure Laterality Date  . CATARACT EXTRACTION    . CHOLECYSTECTOMY  ~ 2010  . COLONOSCOPY    . IUD REMOVAL  1980's  . PARATHYROIDECTOMY N/A 06/16/2012   Procedure: NECK EXPLORATION AND LEFT SUPERIOR PARATHYROIDECTOMY;  Surgeon: Earnstine Regal, MD;  Location: [facility];  Service: General;  Laterality: N/A;  . 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 TOTAL KNEE ARTHROPLASTY Right 07/29/2012   Procedure: TOTAL KNEE ARTHROPLASTY;  Surgeon: Ninetta Lights, MD;  Location: [facility];  Service: Orthopedics;  Laterality: Right;    Allergies    Allergen Reactions  . Ace Inhibitors Cough  . Lipitor [Atorvastatin] Swelling  . Simvastatin Other (See Comments)    Myalgias     . Latex Itching    Outpatient Encounter Medications as of 09/25/2017  Medication Sig  . apixaban (ELIQUIS) 5 MG TABS tablet Take 1 tablet (5 mg total) by mouth every 12 (twelve) hours.  Marland Kitchen buPROPion (WELLBUTRIN XL) 150 MG 24 hr tablet Take 450 mg by mouth daily.  Marland Kitchen CALCIUM PO Take 1 tablet by mouth daily.  . carvedilol (COREG) 12.5 MG tablet Take 12.5 mg by mouth 2 (two) times daily with a meal.  . cholecalciferol (VITAMIN D) 1000 units tablet Take 1,000 Units by mouth daily.   . clobetasol ointment (TEMOVATE) 4.62 % Apply 1 application topically 2 (two) times daily.  Marland Kitchen docusate sodium (COLACE) 100 MG capsule Take 100 mg by mouth daily.  Marland Kitchen donepezil (ARICEPT) 5 MG tablet Take 1 tablet (5 mg total) by mouth at bedtime.  . furosemide (LASIX) 20 MG tablet Take 20 mg by mouth daily.  Marland Kitchen gabapentin (NEURONTIN) 300 MG capsule Take 300 mg by mouth 2 (two) times daily.   . hydrALAZINE (APRESOLINE) 50 MG tablet Take 1 tablet (50 mg total) by mouth 2 (two) times daily.  . iron polysaccharides (IFEREX 150) 150 MG capsule Take 150 mg by mouth daily.  Marland Kitchen losartan (COZAAR) 50 MG tablet Take 1 tablet (50 mg total) by mouth daily.  . Multiple Vitamin (MULTIVITAMIN WITH MINERALS) TABS Take 1 tablet by mouth daily.  . pravastatin (PRAVACHOL) 40 MG tablet Take 40 mg by mouth at bedtime.   . sertraline (ZOLOFT) 25 MG tablet Take 0.5 tablets (12.5 mg total) by mouth 2 (two) times daily.  . vitamin B-12 (CYANOCOBALAMIN) 1000 MCG tablet Take 2,000 mcg by mouth daily.  . [DISCONTINUED] acetaminophen (TYLENOL) 500 MG tablet Take 1,000 mg by mouth daily as needed for moderate pain.   Marland Kitchen acetaminophen (TYLENOL) 500 MG tablet Take 2 tablets (1,000 mg total) by mouth every 6 (six) hours as needed for moderate pain. Take 1000 mg tablet twice daily in the morning and evening and take 1 tablet  ( 500 mg) by mouth daily at 2 pm.  . Menthol, Topical Analgesic, (BIOFREEZE) 4 % GEL Apply 3 oz topically 3 (three) times daily. For knee pain   No facility-administered encounter medications on file as of 09/25/2017.     Review of Systems  Constitutional: Negative for appetite change, chills, fatigue and fever.  Respiratory: Negative for cough, chest tightness, shortness of breath and wheezing.   Cardiovascular: Negative for chest pain, palpitations and leg swelling.  Gastrointestinal: Negative for abdominal distention, abdominal pain, constipation, diarrhea, nausea and vomiting.  Musculoskeletal: Positive for arthralgias and gait problem.       Left knee worsening pain   Skin: Negative for color change, pallor and rash.  Neurological: Negative  for dizziness, light-headedness and headaches.  Psychiatric/Behavioral: Negative for agitation and sleep disturbance. The patient is not nervous/anxious.     Immunization History  Administered Date(s) Administered  . Influenza Whole 10/02/2010  . Influenza, High Dose Seasonal PF 10/25/2016  . PPD Test 01/31/2015  . Pneumococcal-Unspecified 01/08/2012  . Tdap 09/14/2017   Pertinent  Health Maintenance Due  Topic Date Due  . DEXA SCAN  01/22/2003  . PNA vac Low Risk Adult (2 of 2 - PCV13) 01/07/2013  . INFLUENZA VACCINE  11/06/2017 (Originally 08/07/2017)   Fall Risk  09/25/2017 09/09/2017 07/01/2017 06/05/2017 05/16/2017  Falls in the past year? Yes Yes Yes Yes Yes  Number falls in past yr: 2 or more 1 2 or more 2 or more 2 or more  Injury with Fall? No No No Yes No  Comment - - - Injured right shoulder  -  Risk for fall due to : - - - - -  Follow up - - - - -  Comment - - - - -    Vitals:   09/25/17 1138  BP: 104/70  Pulse: (!) 51  Temp: 97.9 F (36.6 C)  TempSrc: Oral  SpO2: 99%  Weight: 175 lb (79.4 kg)  Height: 5\' 2"  (1.575 m)   Body mass index is 32.01 kg/m. Physical Exam  Constitutional: She is oriented to person, place,  and time. She appears well-developed and well-nourished. No distress.  HENT:  Head: Normocephalic.  Mouth/Throat: No oropharyngeal exudate.  Eyes: Pupils are equal, round, and reactive to light. Conjunctivae are normal. Right eye exhibits no discharge. Left eye exhibits no discharge. No scleral icterus.  Neck: Normal range of motion. No JVD present. No thyromegaly present.  Cardiovascular: Normal rate, regular rhythm, normal heart sounds and intact distal pulses. Exam reveals no gallop and no friction rub.  No murmur heard. Pulmonary/Chest: Effort normal and breath sounds normal. No respiratory distress. She has no wheezes. She has no rales.  Abdominal: Soft. Bowel sounds are normal. She exhibits no distension and no mass. There is no tenderness. There is no rebound and no guarding.  Musculoskeletal: She exhibits no tenderness.  Moves x 4 extremities except left knee limted ROM with extension due to pain.Left knee crepitus noted non-tender to touch and no redness noted.Outer knee swelling noted  Lymphadenopathy:    She has no cervical adenopathy.  Neurological: She is oriented to person, place, and time.  Skin: Skin is warm and dry. No rash noted. No erythema. No pallor.  Psychiatric: She has a normal mood and affect. Her speech is normal and behavior is normal. Judgment and thought content normal.  Vitals reviewed.   Labs reviewed: Recent Labs    02/20/17 1101 02/26/17 0844 09/05/17 0803  NA 142 143 142  K 4.0 3.9 4.2  CL 106 107* 109  CO2 23 22 26   GLUCOSE 93 108* 87  BUN 26 20 24   CREATININE 1.45* 1.48* 1.46*  CALCIUM 10.0 10.1 10.3   Recent Labs    09/05/17 0803  AST 21  ALT 14  BILITOT 0.3  PROT 6.4   Recent Labs    02/20/17 1101 06/05/17 1413 09/05/17 0803  WBC 4.1 3.5* 2.9*  NEUTROABS  --  2,090 1,589  HGB 9.3* 8.9* 9.6*  HCT 29.3* 27.4* 28.6*  MCV 82 85.4 89.1  PLT 297 224 229   Lab Results  Component Value Date   TSH 1.95 09/05/2017   Lab Results    Component Value Date  HGBA1C 6.1 (H) 01/25/2015   Lab Results  Component Value Date   CHOL 199 09/05/2017   HDL 65 09/05/2017   LDLCALC 117 (H) 09/05/2017   TRIG 73 09/05/2017   CHOLHDL 3.1 09/05/2017    Significant Diagnostic Results in last 30 days:  [place] Sonohysterogram  Result Date: 08/31/2017 The initial transvaginal ultrasound, done on 07/09/2017, demonstrated the following:  T/V and T/a images. Anteverted uterus measuring 8.13 x 7.93 x 4.38 cm. Unable to identify the endometrial line.Intramural fibroids measuring 3.6 x 3.2 cm, 2.0 x 2.4 cm, 1 cm, 1.9 x 2.0 cm, 2.0 x 2.0 cm, 2.0 x 2.0 cm. Subserous right fibroid at the fundus measuring 4.6 x 3.3 cm. Right and left ovary not seen. No free fluid in the posterior cul-de-sac.   The speculum  was inserted and the cervix cleansed with Betadine solution after confirming that patient has no allergies.A small sonohysterography catheterwas utilized.  Insertion was facilitated with ring forceps, using a spear-like motion the catheter was inserted to the fundus of the uterus. The speculum is then removed carefully to avoid dislodging the catheter. The catheter was flushed with sterile saline delete prior to insertion to rid it of small amounts of air.the sterile saline solution was infused into the uterine cavity as a vaginal ultrasound probe was then placed in the vagina for full visualization of the uterine cavity from a transvaginal approach. The following was noted:  Following injection of saline the endometrial cavity is filled and defects are seen corresponding to a submucosal fibroids measured at 2.2 x 1.3 cm, 1.5 x 1.5 cm, 1.7 x 1.5 cm.  The endometrial lining is measured at a total of 3.5 mm (anterior at 1.8 mm and posterior at 1.7 mm)  The catheter was then removed after retrieving some of the saline from the intrauterine cavity. An endometrial biopsy was done. Patient tolerated procedure well. She had received a tablet of Aleve for  discomfort.     Assessment/Plan 1. Acute pain of left knee Afebrile.left knee limted ROM with extension due to pain.Left knee crepitus noted non-tender to touch and no redness noted.Outer knee swelling noted.change Extra strength Tylenol to 1000 mg tablet one by mouth twice daily and 500 mg tablet daily at 2 PM.Biofreeze to left knee three times daily.will order Knee X-ray to rule out fracture or worsening arthritis.Fall and safety precautions discussed with patient.Will refer to Orthopedic if symptoms not relieved.   - DG Knee Complete 4 Views Left; Future  Family/ staff Communication: Reviewed plan of care with patient.  Labs/tests ordered: X-ray of the left knee 3-4 views rule out fracture/arthritis due to worsening knee pain and swelling.   Sandrea Hughs, NP

## 2017-09-26 ENCOUNTER — Ambulatory Visit
Admission: RE | Admit: 2017-09-26 | Discharge: 2017-09-26 | Disposition: A | Discharge status: Home / Self Care | Payer: PPO | Source: Ambulatory Visit | Attending: Family | Admitting: Family

## 2017-09-26 DIAGNOSIS — M1712 Unilateral primary osteoarthritis, left knee: Secondary | ICD-10-CM | POA: Diagnosis not present

## 2017-09-26 DIAGNOSIS — M25562 Pain in left knee: Secondary | ICD-10-CM

## 2017-09-29 ENCOUNTER — Other Ambulatory Visit: Payer: Self-pay

## 2017-09-29 ENCOUNTER — Telehealth: Payer: Self-pay

## 2017-09-29 DIAGNOSIS — M25562 Pain in left knee: Secondary | ICD-10-CM

## 2017-09-29 NOTE — Addendum Note (Signed)
Addended byMarlowe Sax C on: 09/29/2017 01:29 PM   Modules accepted: Orders

## 2017-09-29 NOTE — Patient Outreach (Signed)
[facility]) Care Management  09/29/2017  Susan [name] Aug 08, 1940 [id number]   TELEPHONE SCREENING Referral date: 09/24/17 Referral source: HTA referral Referral reason: medication assistance  Insurance: Health team advantage Attempt #1  Telephone call to patient regarding referral. Unable to reach patient. HIPAA compliant voice message left with call back phone number.   PLAN: RNCM will attempt 2nd telephone call to patient within 4 business days. RNCM will send outreach letter.   Quinn Plowman RN,BSN, [place] Telephonic  [phone]

## 2017-09-29 NOTE — Telephone Encounter (Signed)
Discussed results with patient, patient verbalized understanding of results  Patient in agreement with referral. Referral is pending for provider signature and diagnosis association   Please complete referral

## 2017-09-29 NOTE — Telephone Encounter (Signed)
-----   Message from Sandrea Hughs, NP sent at 09/28/2017  7:32 PM EDT ----- Left knee showed Osteoarthritis with small suprapatellar joint effusion.continue on Extra strength Tylenol and Biofreeze recently prescribed.Refer to Orthopedic for evaluation of left suprapatellar joint effusion and knee pain.

## 2017-09-30 ENCOUNTER — Encounter (HOSPITAL_BASED_OUTPATIENT_CLINIC_OR_DEPARTMENT_OTHER): Payer: PPO | Attending: Internal Medicine

## 2017-09-30 DIAGNOSIS — G4730 Sleep apnea, unspecified: Secondary | ICD-10-CM | POA: Diagnosis not present

## 2017-09-30 DIAGNOSIS — L97322 Non-pressure chronic ulcer of left ankle with fat layer exposed: Secondary | ICD-10-CM | POA: Insufficient documentation

## 2017-09-30 DIAGNOSIS — I890 Lymphedema, not elsewhere classified: Secondary | ICD-10-CM | POA: Diagnosis not present

## 2017-09-30 DIAGNOSIS — G4733 Obstructive sleep apnea (adult) (pediatric): Secondary | ICD-10-CM | POA: Diagnosis not present

## 2017-09-30 DIAGNOSIS — I10 Essential (primary) hypertension: Secondary | ICD-10-CM | POA: Diagnosis not present

## 2017-09-30 DIAGNOSIS — Z8673 Personal history of transient ischemic attack (TIA), and cerebral infarction without residual deficits: Secondary | ICD-10-CM | POA: Diagnosis not present

## 2017-09-30 DIAGNOSIS — I87332 Chronic venous hypertension (idiopathic) with ulcer and inflammation of left lower extremity: Secondary | ICD-10-CM | POA: Insufficient documentation

## 2017-09-30 DIAGNOSIS — S91002A Unspecified open wound, left ankle, initial encounter: Secondary | ICD-10-CM | POA: Diagnosis not present

## 2017-10-01 ENCOUNTER — Telehealth: Payer: Self-pay

## 2017-10-01 NOTE — Telephone Encounter (Signed)
Side note, patient called Susan [name] again stating that she was calling to inquire about the status of her mammogram. I informed patient that we had a conversation 30 min ago about mammogram request and I am awaiting response from [facility], patient said ok and call was ended.  Patient seemed confused and acted as if she didn't recall conversation 30 min prior

## 2017-10-01 NOTE — Telephone Encounter (Signed)
Patient called to check the status of her mammogram order placed on 09/09/17. According to notes on appointment desk the radiology department called patient on 09/10/17 to schedule appointment and patient told them that Dr.Carter felt something in her left breast and it was determined that patient needs an order for a diagnostic mammogram vs 3D Mammogram.   Its noted that someone from radiology left a message to request an order on the appointment desk, yet I was unable to locate an incoming telephone encounter to [facility].  I see a note on 09/11/17 where patient called to inquire about order, I am not sure that it was understood that she needed a different order that what was originally placed.  Dr.Carter please advise and place diagnostic mammogram if in agreement.  S.Chrae B/CMA

## 2017-10-02 DIAGNOSIS — I110 Hypertensive heart disease with heart failure: Secondary | ICD-10-CM | POA: Diagnosis not present

## 2017-10-02 DIAGNOSIS — L97323 Non-pressure chronic ulcer of left ankle with necrosis of muscle: Secondary | ICD-10-CM | POA: Diagnosis not present

## 2017-10-02 DIAGNOSIS — I87332 Chronic venous hypertension (idiopathic) with ulcer and inflammation of left lower extremity: Secondary | ICD-10-CM | POA: Diagnosis not present

## 2017-10-02 DIAGNOSIS — I69351 Hemiplegia and hemiparesis following cerebral infarction affecting right dominant side: Secondary | ICD-10-CM | POA: Diagnosis not present

## 2017-10-02 DIAGNOSIS — I890 Lymphedema, not elsewhere classified: Secondary | ICD-10-CM | POA: Diagnosis not present

## 2017-10-02 NOTE — Telephone Encounter (Signed)
I discussed Dr.Carter's response with patient. Patient verbalized understanding. Patient plans to call to set up mammogram. Patient states she has the number at home and didn't need for me to provide her with that information.

## 2017-10-02 NOTE — Telephone Encounter (Signed)
Pt needs screening mammogram as she is overdue; I felt a cyst in her breast, not a lump

## 2017-10-03 ENCOUNTER — Other Ambulatory Visit: Payer: Self-pay

## 2017-10-03 NOTE — Patient Outreach (Signed)
[place] [facility]) Care Management  10/03/2017  BANNIE LOBBAN 02/14/1940 [id number]  TELEPHONE SCREENING Referral date: 09/24/17 Referral source: HTA referral Referral reason: medication assistance  Insurance: Health team advantage Attempt #2 Telephone call to patient regarding referral. Unable to reach patient. HIPAA compliant voice message left with call back phone number.   PLAN: RNCM will attempt 3rd telephone call to patient within 4 business days.    Quinn Plowman RN,BSN, [place] Telephonic  [phone]

## 2017-10-07 ENCOUNTER — Ambulatory Visit (INDEPENDENT_AMBULATORY_CARE_PROVIDER_SITE_OTHER): Payer: PPO | Admitting: Orthopaedic Surgery

## 2017-10-07 ENCOUNTER — Encounter (INDEPENDENT_AMBULATORY_CARE_PROVIDER_SITE_OTHER): Payer: Self-pay | Admitting: Orthopaedic Surgery

## 2017-10-07 DIAGNOSIS — M1712 Unilateral primary osteoarthritis, left knee: Secondary | ICD-10-CM

## 2017-10-07 MED ORDER — BUPIVACAINE HCL 0.5 % IJ SOLN
2.0000 mL | INTRAMUSCULAR | Status: AC | PRN
  Administered 2017-10-07: 2 mL via INTRA_ARTICULAR

## 2017-10-07 MED ORDER — METHYLPREDNISOLONE ACETATE 40 MG/ML IJ SUSP
40.0000 mg | INTRAMUSCULAR | Status: AC | PRN
  Administered 2017-10-07: 40 mg via INTRA_ARTICULAR

## 2017-10-07 MED ORDER — DICLOFENAC SODIUM 1 % TD GEL: 2.0000 g | Freq: Four times a day (QID) | TRANSDERMAL | 5 refills | Status: DC

## 2017-10-07 MED ORDER — LIDOCAINE HCL 1 % IJ SOLN
2.0000 mL | INTRAMUSCULAR | Status: AC | PRN
  Administered 2017-10-07: 2 mL

## 2017-10-07 NOTE — Progress Notes (Signed)
Office Visit Note   Patient: Susan [name]           Date of Birth: [birth year]           MRN: [id number] Visit Date: 10/07/2017              Requested by: Susan [name], [address] [place], [place] [address] PCP: Susan Cranker, DO   Assessment & Plan: Visit Diagnoses:  1. Unilateral primary osteoarthritis, left knee     Plan: Impression is end-stage left knee tricompartmental degenerative joint disease.  Her recent x-rays were reviewed with patient.  She has not had a cortisone injection recently which we did try today.  Hopefully this will calm it down and get her feeling better.  She does understand that she has a fair amount of degenerative disease in her left knee.  Prescription for Voltaren gel.  Follow-up as needed.  Follow-Up Instructions: Return if symptoms worsen or fail to improve.   Orders:  No orders of the defined types were placed in this encounter.  Meds ordered this encounter  Medications  . diclofenac sodium (VOLTAREN) 1 % GEL    Sig: Apply 2 g topically 4 (four) times daily.    Dispense:  1 Tube    Refill:  5  . diclofenac sodium (VOLTAREN) 1 % GEL    Sig: Apply 2 g topically 4 (four) times daily.    Dispense:  1 Tube    Refill:  5      Procedures: Large Joint Inj: L knee on 10/07/2017 10:38 AM Details: 22 G needle Medications: 2 mL bupivacaine 0.5 %; 2 mL lidocaine 1 %; 40 mg methylPREDNISolone acetate 40 MG/ML Outcome: tolerated well, no immediate complications Patient was prepped and draped in the usual sterile fashion.       Clinical Data: No additional findings.   Subjective: Chief Complaint  Patient presents with  . Left Knee - Pain    Susan [name] is a very pleasant 77 year old female who comes in today for chronic left knee pain.  She ambulates with a Rollator.  She is some years status post right total knee replacement.  She has had a history of stroke.  The pain is worse with flexion and with extension.  The pain is worse with  activity and prolonged standing.  She has taken over-the-counter medicines with partial relief.   Review of Systems  Constitutional: Negative.   HENT: Negative.   Eyes: Negative.   Respiratory: Negative.   Cardiovascular: Negative.   Endocrine: Negative.   Musculoskeletal: Negative.   Neurological: Negative.   Hematological: Negative.   Psychiatric/Behavioral: Negative.   All other systems reviewed and are negative.    Objective: Vital Signs: There were no vitals taken for this visit.  Physical Exam  Constitutional: She is oriented to person, place, and time. She appears well-developed and well-nourished.  HENT:  Head: Normocephalic and atraumatic.  Eyes: EOM are normal.  Neck: Neck supple.  Pulmonary/Chest: Effort normal.  Abdominal: Soft.  Neurological: She is alert and oriented to person, place, and time.  Skin: Skin is warm. Capillary refill takes less than 2 seconds.  Psychiatric: She has a normal mood and affect. Her behavior is normal. Judgment and thought content normal.  Nursing note and vitals reviewed.   Ortho Exam Left knee exam shows no joint effusion.  Collaterals and cruciates are stable.  Normal range of motion with pain. Specialty Comments:  No specialty comments available.  Imaging: No results found.  PMFS History: Patient Active Problem List   Diagnosis Date Noted  . Medication management 10/11/2016  . Hyperlipemia 06/18/2016  . Gait abnormality 06/18/2016  . Major depressive disorder, recurrent episode, severe ([place]) 11/21/2015  . Edema 02/09/2015  . History of CVA (cerebrovascular accident) 02/01/2015  . 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 ([place])   . 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 Chronic low back pain 01/28/2015  . Short-term memory loss 01/28/2015  . Volume depletion 01/28/2015  . Persistent atrial fibrillation 01/26/2015  . DCM (dilated cardiomyopathy) ([place]) 01/26/2015  . TIA (transient  ischemic attack) 01/24/2015  . Chronic diastolic (congestive) heart failure ([place])   . Obesity (BMI 30-39.9) 01/27/2013  . Constipation 09/02/2012  . H/O arthroscopy of right knee 08/06/2012  . Anxiety   . Depression   . GERD (gastroesophageal reflux disease)   . Hypertensive heart disease   . OSA (obstructive sleep apnea)   . Osteoarthritis of right knee   . Hyperparathyroidism, primary ([place]) 04/22/2012  . Chronic cough 10/02/2011   Past Medical History:  Diagnosis Date  . Anxiety   . Arthritis    "legs, back" (07/29/2012)  . Chronic diastolic (congestive) heart failure ([place])   . Chronic lower back pain   . Complication of anesthesia    "I have apnea" (07/29/2012)  . Dementia ([place])    per family  . Depression   . 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 Edema 02/09/2015  . Family history of anesthesia complication    "some wake up during OR; some are hard to wake up; some both" (07/29/2012)  . Gait abnormality 06/18/2016  . GERD (gastroesophageal reflux disease)   . 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 ([place])   . History of CVA (cerebrovascular accident) 02/01/2015  . History of stomach ulcers 1980's  . Hyperlipemia 06/18/2016  . Hyperlipidemia   . Hyperparathyroidism, primary ([place]) 04/22/2012  . Hypertensive heart disease   . Incontinent of urine    wears pads  . Major depressive disorder, recurrent episode, severe ([place]) 11/21/2015  . Medication management 10/11/2016  . OSA (obstructive sleep apnea)   . OSA on CPAP   . Osteoarthritis of right knee   . Pedal edema   . Persistent atrial fibrillation    CHADS2VASC score is 7 - on chronic anticoagulation with Apixaban  . Primary hyperparathyroidism ([place])   . Short-term memory loss 01/28/2015  . Spinal stenosis   . Stroke ([place])   . Thyroid disease   . TIA (transient ischemic attack) 01/24/2015  . Umbilical hernia    "unrepaired" (07/29/2012)  . Varicose veins    "BLE" (07/29/2012)    Family  History  Problem Relation Age of Onset  . Hypertension Mother        deceased  . Stroke Mother   . Lung cancer Father        deceased  . Diabetes Daughter   . Hypertension Daughter     Past Surgical History:  Procedure Laterality Date  . CATARACT EXTRACTION    . CHOLECYSTECTOMY  ~ 2010  . COLONOSCOPY    . IUD REMOVAL  1980's  . PARATHYROIDECTOMY N/A 06/16/2012   Procedure: NECK EXPLORATION AND LEFT SUPERIOR PARATHYROIDECTOMY;  Surgeon: Earnstine Regal, MD;  Location: [facility];  Service: General;  Laterality: N/A;  . 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 TOTAL KNEE ARTHROPLASTY Right 07/29/2012   Procedure: TOTAL KNEE ARTHROPLASTY;  Surgeon: Ninetta Lights,  MD;  Location: [facility];  Service: Orthopedics;  Laterality: Right;   Social History   Occupational History  . Occupation: retired    Fish farm manager: RETIRED  Tobacco Use  . Smoking status: Former Smoker    Packs/day: 1.00    Years: 30.00    Pack years: 30.00    Types: Cigarettes    Last attempt to quit: 01/07/1978    Years since quitting: 39.7  . Smokeless tobacco: Never Used  Substance and Sexual Activity  . Alcohol use: No  . Drug use: No  . Sexual activity: Not Currently    Comment: intercourse age 5,less than 5 secxual partners,

## 2017-10-08 ENCOUNTER — Telehealth: Payer: Self-pay | Admitting: *Deleted

## 2017-10-08 ENCOUNTER — Other Ambulatory Visit: Payer: Self-pay

## 2017-10-08 NOTE — Patient Outreach (Signed)
[facility]) Care Management  10/08/2017  ITZY ADLER 07-01-1940 [id number]   TELEPHONE SCREENING Referral date:09/24/17 Referral source:HTA referral Referral reason:medication assistance Insurance:Health team advantage Attempt #3  Telephone call to patient regarding referral. Unable to reach patient. HIPAA compliant voice message left with call back phone number.   PLAN: If not return call RNCM will proceed with case closure    Woody Seller, [place] Telephonic  [phone]

## 2017-10-08 NOTE — Telephone Encounter (Signed)
Ok as requested

## 2017-10-08 NOTE — Telephone Encounter (Signed)
[facility] notified and agreed.

## 2017-10-08 NOTE — Telephone Encounter (Signed)
Holly with [facility] called requesting verbal order for Social worker and PT evaluation. Is this ok. Please Advise.

## 2017-10-10 ENCOUNTER — Encounter (HOSPITAL_BASED_OUTPATIENT_CLINIC_OR_DEPARTMENT_OTHER): Payer: PPO | Attending: Internal Medicine

## 2017-10-10 DIAGNOSIS — I890 Lymphedema, not elsewhere classified: Secondary | ICD-10-CM | POA: Diagnosis not present

## 2017-10-10 DIAGNOSIS — Z09 Encounter for follow-up examination after completed treatment for conditions other than malignant neoplasm: Secondary | ICD-10-CM | POA: Diagnosis not present

## 2017-10-10 DIAGNOSIS — I509 Heart failure, unspecified: Secondary | ICD-10-CM | POA: Insufficient documentation

## 2017-10-10 DIAGNOSIS — I872 Venous insufficiency (chronic) (peripheral): Secondary | ICD-10-CM | POA: Diagnosis not present

## 2017-10-10 DIAGNOSIS — I110 Hypertensive heart disease with heart failure: Secondary | ICD-10-CM | POA: Diagnosis not present

## 2017-10-10 DIAGNOSIS — Z872 Personal history of diseases of the skin and subcutaneous tissue: Secondary | ICD-10-CM | POA: Diagnosis not present

## 2017-10-10 DIAGNOSIS — G629 Polyneuropathy, unspecified: Secondary | ICD-10-CM | POA: Diagnosis not present

## 2017-10-10 DIAGNOSIS — G4730 Sleep apnea, unspecified: Secondary | ICD-10-CM | POA: Insufficient documentation

## 2017-10-10 DIAGNOSIS — S91002A Unspecified open wound, left ankle, initial encounter: Secondary | ICD-10-CM | POA: Diagnosis not present

## 2017-10-14 ENCOUNTER — Other Ambulatory Visit: Payer: Self-pay

## 2017-10-14 DIAGNOSIS — L97323 Non-pressure chronic ulcer of left ankle with necrosis of muscle: Secondary | ICD-10-CM | POA: Diagnosis not present

## 2017-10-14 DIAGNOSIS — I87332 Chronic venous hypertension (idiopathic) with ulcer and inflammation of left lower extremity: Secondary | ICD-10-CM | POA: Diagnosis not present

## 2017-10-14 DIAGNOSIS — I110 Hypertensive heart disease with heart failure: Secondary | ICD-10-CM | POA: Diagnosis not present

## 2017-10-14 DIAGNOSIS — I890 Lymphedema, not elsewhere classified: Secondary | ICD-10-CM | POA: Diagnosis not present

## 2017-10-14 DIAGNOSIS — I69351 Hemiplegia and hemiparesis following cerebral infarction affecting right dominant side: Secondary | ICD-10-CM | POA: Diagnosis not present

## 2017-10-14 NOTE — Patient Outreach (Signed)
[facility]) Care Management  10/14/2017  Susan [name] 1940/11/18 [id number]  TELEPHONE SCREENING Referral date:09/24/17 Referral source:HTA referral Referral reason:medication assistance Insurance:Health team advantage  No response after 3 telephone calls and outreach letter attempt.  PLAN: RNCM will close patient due to being unable to reach.  RNCM will send closure notification to patient's primary MD   Quinn Plowman RN,BSN,CCM [facility] Telephonic  [phone]

## 2017-10-16 ENCOUNTER — Telehealth: Payer: Self-pay | Admitting: *Deleted

## 2017-10-16 NOTE — Telephone Encounter (Signed)
Debbie, Education officer, museum with [employer] called and left message on Clinical intake wanting to discuss patient.   Tried calling, LMOM to return call.

## 2017-10-17 NOTE — Telephone Encounter (Signed)
Debbie returned called to inform Dr.Carter that patient had a Education officer, museum visit yesterday and it appears that patients memory issues are not severe. Patients biggest concern is depression.  Patient needs referral to psych  Debbie's plan of care is to visit the patient 1 x weekly for 3 weeks, verbal order give per standing [facility] protocol  I advised Debbie that I will provide patient with contact information for psych/counseling services. Patient will need to call and initiate herself. Debbie verbalized understanding.   Per DPR, I left detail message informing patient that I was calling to follow-up from conversation with social worker and to provide her with contact information for counseling services.  (Information not left on VM)  [facility] [address] [place], [place] [address] Phone: [facility] [address] [place], [place] [address] Phone: [phone]  [facility] [address] [place], [place] [address] Phone [phone]  [facility], [place], [place], [place] [address], [place] [address] [place], [place] [address] [phone] In home visit available with prior arrangement  Awaiting return call

## 2017-10-17 NOTE — Telephone Encounter (Signed)
Patient returned call and asked that I send psych contact information via mychart.  Information sent as requested

## 2017-10-21 ENCOUNTER — Ambulatory Visit (INDEPENDENT_AMBULATORY_CARE_PROVIDER_SITE_OTHER): Payer: PPO | Admitting: Internal Medicine

## 2017-10-21 ENCOUNTER — Encounter: Payer: Self-pay | Admitting: Internal Medicine

## 2017-10-21 VITALS — BP 158/76 | HR 54 | Temp 98.5°F | Ht 62.0 in | Wt 174.0 lb

## 2017-10-21 DIAGNOSIS — R2681 Unsteadiness on feet: Secondary | ICD-10-CM

## 2017-10-21 DIAGNOSIS — F0150 Vascular dementia without behavioral disturbance: Secondary | ICD-10-CM | POA: Diagnosis not present

## 2017-10-21 MED ORDER — DONEPEZIL HCL 10 MG PO TABS: 10.0000 mg | ORAL_TABLET | Freq: Every day | ORAL | 11 refills | Status: DC

## 2017-10-21 MED ORDER — DONEPEZIL HCL 10 MG PO TABS: 10.0000 mg | ORAL_TABLET | Freq: Every day | ORAL | 0 refills | Status: DC

## 2017-10-21 NOTE — Patient Instructions (Addendum)
INCREASE ARICEPT (DONEPEZIL) TO 10MG  AT BEDTIME FOR MEMORY LOSS - [facility] until pillpack arrives from [place] other medications as ordered  Follow up with specialists as scheduled  Keep appt with Clarise Cruz for annual wellness visit next month  Follow up in 3 mos with Sherrie Mustache, NP for routine visit. Fasting labs prior to appt.

## 2017-10-21 NOTE — Progress Notes (Signed)
Patient ID: Susan [name], female   DOB: 1940-09-23, 77 y.o.   MRN: [id number]   Location:  [facility] OFFICE  Provider: DR Arletha Grippe  Code Status:  Goals of Care:  Advanced Directives 07/01/2017  Does Patient Have a Medical Advance Directive? No  Type of Advance Directive -  Does patient want to make changes to medical advance directive? -  Copy of [place] in Chart? -  Would patient like information on creating a medical advance directive? -  Pre-existing out of facility DNR order (yellow form or pink MOST form) -     Chief Complaint  Patient presents with  . Medical Management of Chronic Issues    1 month follow-up   . Immunizations    Discuss pneumonia vaccine   . Health Maintenance    Discuss Dexa Recommendation     HPI: Patient is a 77 y.o. female seen today for f/u dementia. She was started on aricept at last ov and reports no change in memory. Denies nightmares or diarrhea. She started working with Physiological scientist at [employer]. She has increased balance issues. She saw Ortho and had left knee injection. Denies CP or SOB, no palpitations.   Past Medical History:  Diagnosis Date  . Anxiety   . Arthritis    "legs, back" (07/29/2012)  . Chronic diastolic (congestive) heart failure ([place])   . Chronic lower back pain   . Complication of anesthesia    "I have apnea" (07/29/2012)  . Dementia ([place])    per family  . Depression   . 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 Edema 02/09/2015  . Family history of anesthesia complication    "some wake up during OR; some are hard to wake up; some both" (07/29/2012)  . Gait abnormality 06/18/2016  . GERD (gastroesophageal reflux disease)   . 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 ([place])   . History of CVA (cerebrovascular accident) 02/01/2015  . History of stomach ulcers 1980's  . Hyperlipemia 06/18/2016  . Hyperlipidemia   . Hyperparathyroidism, primary ([place]) 04/22/2012  .  Hypertensive heart disease   . Incontinent of urine    wears pads  . Major depressive disorder, recurrent episode, severe ([place]) 11/21/2015  . Medication management 10/11/2016  . OSA (obstructive sleep apnea)   . OSA on CPAP   . Osteoarthritis of right knee   . Pedal edema   . Persistent atrial fibrillation    CHADS2VASC score is 7 - on chronic anticoagulation with Apixaban  . Primary hyperparathyroidism ([place])   . Short-term memory loss 01/28/2015  . Spinal stenosis   . Stroke ([place])   . Thyroid disease   . TIA (transient ischemic attack) 01/24/2015  . Umbilical hernia    "unrepaired" (07/29/2012)  . Varicose veins    "BLE" (07/29/2012)    Past Surgical History:  Procedure Laterality Date  . CATARACT EXTRACTION    . CHOLECYSTECTOMY  ~ 2010  . COLONOSCOPY    . IUD REMOVAL  1980's  . PARATHYROIDECTOMY N/A 06/16/2012   Procedure: NECK EXPLORATION AND LEFT SUPERIOR PARATHYROIDECTOMY;  Surgeon: Earnstine Regal, MD;  Location: [facility];  Service: General;  Laterality: N/A;  . 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 TOTAL KNEE ARTHROPLASTY Right 07/29/2012   Procedure: TOTAL KNEE ARTHROPLASTY;  Surgeon: Ninetta Lights, MD;  Location: [place];  Service: Orthopedics;  Laterality: Right;     reports that she quit smoking about 39 years ago.  Her smoking use included cigarettes. She has a 30.00 pack-year smoking history. She has never used smokeless tobacco. She reports that she does not drink alcohol or use drugs. Social History   Socioeconomic History  . Marital status: Widowed    Spouse name: Not on file  . Number of children: Not on file  . Years of education: Not on file  . Highest education level: Not on file  Occupational History  . Occupation: retired    Fish farm manager: RETIRED  Social Needs  . Financial resource strain: Not on file  . Food insecurity:    Worry: Not on file    Inability: Not on file  . Transportation needs:    Medical: Not on file    Non-medical: Not on file    Tobacco Use  . Smoking status: Former Smoker    Packs/day: 1.00    Years: 30.00    Pack years: 30.00    Types: Cigarettes    Last attempt to quit: 01/07/1978    Years since quitting: 39.8  . Smokeless tobacco: Never Used  Substance and Sexual Activity  . Alcohol use: No  . Drug use: No  . Sexual activity: Not Currently    Comment: intercourse age 61,less than 5 secxual partners,  Lifestyle  . Physical activity:    Days per week: Not on file    Minutes per session: Not on file  . Stress: Not on file  Relationships  . Social connections:    Talks on phone: Not on file    Gets together: Not on file    Attends religious service: Not on file    Active member of club or organization: Not on file    Attends meetings of clubs or organizations: Not on file    Relationship status: Not on file  . Intimate partner violence:    Fear of current or ex partner: Not on file    Emotionally abused: Not on file    Physically abused: Not on file    Forced sexual activity: Not on file  Other Topics Concern  . Not on file  Social History Narrative   Lives at home alone   Right-handed   Caffeine: 1 cup of coffee per day         Diet:      Caffeine:      Married, if yes what year:       Do you live in a house, apartment, assisted living, condo, trailer, ect:      Pets:      Current/Past profession:      Exercise:         Living Will: Yes   DNR: No   POA/HPOA: Yes      Functional Status:   Do you have difficulty bathing or dressing yourself? Yes   Do you have difficulty preparing food or eating? No   Do you have difficulty managing your medications? Yes   Do you have difficulty managing your finances? Yes   Do you have difficulty affording your medications? Yes    Family History  Problem Relation Age of Onset  . Hypertension Mother        deceased  . Stroke Mother   . Lung cancer Father        deceased  . Diabetes Daughter   . Hypertension Daughter     Allergies   Allergen Reactions  . Ace Inhibitors Cough  . Lipitor [Atorvastatin] Swelling  . Simvastatin Other (See Comments)  Myalgias     . Latex Itching    Outpatient Encounter Medications as of 10/21/2017  Medication Sig  . acetaminophen (TYLENOL) 500 MG tablet Take 2 tablets (1,000 mg total) by mouth every 6 (six) hours as needed for moderate pain. Take 1000 mg tablet twice daily in the morning and evening and take 1 tablet ( 500 mg) by mouth daily at 2 pm.  . apixaban (ELIQUIS) 5 MG TABS tablet Take 1 tablet (5 mg total) by mouth every 12 (twelve) hours.  Marland Kitchen buPROPion (WELLBUTRIN XL) 150 MG 24 hr tablet Take 450 mg by mouth daily.  Marland Kitchen CALCIUM PO Take 1 tablet by mouth daily.  . carvedilol (COREG) 12.5 MG tablet Take 12.5 mg by mouth 2 (two) times daily with a meal.  . cholecalciferol (VITAMIN D) 1000 units tablet Take 1,000 Units by mouth daily.   . clobetasol ointment (TEMOVATE) 3.24 % Apply 1 application topically 2 (two) times daily.  . diclofenac sodium (VOLTAREN) 1 % GEL Apply 2 g topically 4 (four) times daily.  . diclofenac sodium (VOLTAREN) 1 % GEL Apply 2 g topically 4 (four) times daily.  Marland Kitchen docusate sodium (COLACE) 100 MG capsule Take 100 mg by mouth daily.  Marland Kitchen donepezil (ARICEPT) 5 MG tablet Take 1 tablet (5 mg total) by mouth at bedtime.  . furosemide (LASIX) 20 MG tablet Take 20 mg by mouth daily.  Marland Kitchen gabapentin (NEURONTIN) 300 MG capsule Take 300 mg by mouth 2 (two) times daily.   . hydrALAZINE (APRESOLINE) 50 MG tablet Take 1 tablet (50 mg total) by mouth 2 (two) times daily.  . iron polysaccharides (IFEREX 150) 150 MG capsule Take 150 mg by mouth daily.  Marland Kitchen losartan (COZAAR) 50 MG tablet Take 1 tablet (50 mg total) by mouth daily.  . Menthol, Topical Analgesic, (BIOFREEZE) 4 % GEL Apply 3 oz topically 3 (three) times daily. For knee pain  . Multiple Vitamin (MULTIVITAMIN WITH MINERALS) TABS Take 1 tablet by mouth daily.  . pravastatin (PRAVACHOL) 40 MG tablet Take 40 mg by  mouth at bedtime.   . sertraline (ZOLOFT) 25 MG tablet Take 0.5 tablets (12.5 mg total) by mouth 2 (two) times daily.  . vitamin B-12 (CYANOCOBALAMIN) 1000 MCG tablet Take 2,000 mcg by mouth daily.   No facility-administered encounter medications on file as of 10/21/2017.     Review of Systems:  Review of Systems  Unable to perform ROS: Dementia    Health Maintenance  Topic Date Due  . DEXA SCAN  01/22/2003  . PNA vac Low Risk Adult (2 of 2 - PCV13) 01/07/2013  . INFLUENZA VACCINE  11/06/2017 (Originally 08/07/2017)  . TETANUS/TDAP  09/15/2027    Physical Exam: Vitals:   10/21/17 1015  BP: (!) 158/76  Pulse: (!) 54  Temp: 98.5 F (36.9 C)  TempSrc: Oral  SpO2: 99%  Weight: 174 lb (78.9 kg)  Height: 5\' 2"  (1.575 m)   Body mass index is 31.83 kg/m. Physical Exam  Constitutional: She appears well-developed and well-nourished.  Cardiovascular: Normal rate, regular rhythm and intact distal pulses. Exam reveals no gallop and no friction rub.  Murmur (1/6 SEM) heard. Trace LE edema b/l. No calf TTP  Pulmonary/Chest: Effort normal and breath sounds normal. No stridor. No respiratory distress. She has no wheezes. She has no rales. She exhibits no tenderness.  Musculoskeletal: She exhibits edema (small and large joints) and tenderness.  Neurological: She is alert. Gait (antalgic; unsteady; uses rolling walker with seat and brakes) abnormal.  Skin: Skin is warm and dry. No rash noted.  Psychiatric: She has a normal mood and affect. Her behavior is normal. Thought content normal.    Labs reviewed: Basic Metabolic Panel: Recent Labs    02/20/17 1101 02/26/17 0844 09/05/17 0803  NA 142 143 142  K 4.0 3.9 4.2  CL 106 107* 109  CO2 23 22 26   GLUCOSE 93 108* 87  BUN 26 20 24   CREATININE 1.45* 1.48* 1.46*  CALCIUM 10.0 10.1 10.3  TSH  --   --  1.95   Liver Function Tests: Recent Labs    09/05/17 0803  AST 21  ALT 14  BILITOT 0.3  PROT 6.4   No results for input(s):  LIPASE, AMYLASE in the last 8760 hours. No results for input(s): AMMONIA in the last 8760 hours. CBC: Recent Labs    02/20/17 1101 06/05/17 1413 09/05/17 0803  WBC 4.1 3.5* 2.9*  NEUTROABS  --  2,090 1,589  HGB 9.3* 8.9* 9.6*  HCT 29.3* 27.4* 28.6*  MCV 82 85.4 89.1  PLT 297 224 229   Lipid Panel: Recent Labs    09/05/17 0803  CHOL 199  HDL 65  LDLCALC 117*  TRIG 73  CHOLHDL 3.1   Lab Results  Component Value Date   HGBA1C 6.1 (H) 01/25/2015    Procedures since last visit: Dg Knee Complete 4 Views Left  Result Date: 09/26/2017 CLINICAL DATA:  Worsening chronic knee pain. EXAM: LEFT KNEE - COMPLETE 4+ VIEW COMPARISON:  None. FINDINGS: No acute fracture or dislocation. Mild-to-moderate tricompartmental joint space narrowing, most prominent in the lateral compartment. Small suprapatellar joint effusion. Osteopenia. Soft tissues are unremarkable. IMPRESSION: 1. Mild-to-moderate tricompartmental osteoarthritis, worst in the lateral compartment. No acute osseous abnormality. Electronically Signed   By: Titus Dubin M.D.   On: 09/26/2017 16:53    Assessment/Plan   ICD-10-CM   1. Vascular dementia without behavioral disturbance (HCC) - stable F01.50 donepezil (ARICEPT) 10 MG tablet    donepezil (ARICEPT) 10 MG tablet  2. Unsteady gait R26.81   3. Vascular dementia without behavioral disturbance (HCC) F01.50 donepezil (ARICEPT) 10 MG tablet    donepezil (ARICEPT) 10 MG tablet   MMSE 28/30   INCREASE ARICEPT (DONEPEZIL) TO 10MG  AT BEDTIME FOR MEMORY LOSS - [facility] day [facility] until pillpack arrives from [place] other medications as ordered  Follow up with specialists as scheduled  Keep appt with Clarise Cruz for annual wellness visit next month  Follow up in 3 mos with Sherrie Mustache, NP for routine visit. Fasting labs prior to appt (cmp, lipid panel, cbc, ferritin, iron)   Amoreena Neubert S. Perlie Gold  [facility] [address] [place], [place] [address] [phone] Cell (Monday-Friday 8 AM - 5 PM) [phone] After 5 PM and follow prompts

## 2017-10-24 DIAGNOSIS — I69351 Hemiplegia and hemiparesis following cerebral infarction affecting right dominant side: Secondary | ICD-10-CM | POA: Diagnosis not present

## 2017-10-24 DIAGNOSIS — I110 Hypertensive heart disease with heart failure: Secondary | ICD-10-CM | POA: Diagnosis not present

## 2017-10-24 DIAGNOSIS — L97323 Non-pressure chronic ulcer of left ankle with necrosis of muscle: Secondary | ICD-10-CM | POA: Diagnosis not present

## 2017-10-24 DIAGNOSIS — I890 Lymphedema, not elsewhere classified: Secondary | ICD-10-CM | POA: Diagnosis not present

## 2017-10-24 DIAGNOSIS — I87332 Chronic venous hypertension (idiopathic) with ulcer and inflammation of left lower extremity: Secondary | ICD-10-CM | POA: Diagnosis not present

## 2017-11-11 DIAGNOSIS — F339 Major depressive disorder, recurrent, unspecified: Secondary | ICD-10-CM | POA: Diagnosis not present

## 2017-11-12 ENCOUNTER — Other Ambulatory Visit: Payer: Self-pay | Admitting: Obstetrics & Gynecology

## 2017-11-12 ENCOUNTER — Ambulatory Visit
Admission: RE | Admit: 2017-11-12 | Discharge: 2017-11-12 | Disposition: A | Discharge status: Home / Self Care | Payer: PPO | Source: Ambulatory Visit | Attending: Internal Medicine | Admitting: Internal Medicine

## 2017-11-12 ENCOUNTER — Telehealth: Payer: Self-pay | Admitting: *Deleted

## 2017-11-12 ENCOUNTER — Other Ambulatory Visit: Payer: Self-pay

## 2017-11-12 ENCOUNTER — Ambulatory Visit
Admission: RE | Admit: 2017-11-12 | Discharge: 2017-11-12 | Disposition: A | Discharge status: Home / Self Care | Payer: PPO | Source: Ambulatory Visit | Attending: Obstetrics & Gynecology | Admitting: Obstetrics & Gynecology

## 2017-11-12 ENCOUNTER — Other Ambulatory Visit: Payer: Self-pay | Admitting: Internal Medicine

## 2017-11-12 DIAGNOSIS — I110 Hypertensive heart disease with heart failure: Secondary | ICD-10-CM | POA: Diagnosis not present

## 2017-11-12 DIAGNOSIS — I890 Lymphedema, not elsewhere classified: Secondary | ICD-10-CM | POA: Diagnosis not present

## 2017-11-12 DIAGNOSIS — Z1239 Encounter for other screening for malignant neoplasm of breast: Secondary | ICD-10-CM

## 2017-11-12 DIAGNOSIS — Z1231 Encounter for screening mammogram for malignant neoplasm of breast: Secondary | ICD-10-CM | POA: Diagnosis not present

## 2017-11-12 DIAGNOSIS — I87332 Chronic venous hypertension (idiopathic) with ulcer and inflammation of left lower extremity: Secondary | ICD-10-CM | POA: Diagnosis not present

## 2017-11-12 DIAGNOSIS — L97323 Non-pressure chronic ulcer of left ankle with necrosis of muscle: Secondary | ICD-10-CM | POA: Diagnosis not present

## 2017-11-12 DIAGNOSIS — I69351 Hemiplegia and hemiparesis following cerebral infarction affecting right dominant side: Secondary | ICD-10-CM | POA: Diagnosis not present

## 2017-11-12 DIAGNOSIS — N63 Unspecified lump in unspecified breast: Secondary | ICD-10-CM

## 2017-11-12 NOTE — Telephone Encounter (Signed)
Susan [name] with [facility] called and stated that the patient was there for mammogram and stated that Dr. Eulas Post felt a Cyst/Lump in her breast in her examination at last [place] appointment. I reviewed last OV note and nothing was stated of any concern. Cathy aware and will perform a Routine mammogram.

## 2017-11-13 ENCOUNTER — Encounter (INDEPENDENT_AMBULATORY_CARE_PROVIDER_SITE_OTHER): Payer: Self-pay | Admitting: Orthopaedic Surgery

## 2017-11-13 ENCOUNTER — Telehealth (INDEPENDENT_AMBULATORY_CARE_PROVIDER_SITE_OTHER): Payer: Self-pay | Admitting: Orthopaedic Surgery

## 2017-11-13 NOTE — Telephone Encounter (Signed)
Please advise. Thanks.  

## 2017-11-13 NOTE — Telephone Encounter (Signed)
I called her

## 2017-11-13 NOTE — Telephone Encounter (Signed)
Patient called in stated that she came in and had cortisone injection on her knee 10/07/17 and it has not helped any. I offered patient an appt to come back in and she does not want to pay another co-pay.  Please call patient to advise. [phone]

## 2017-11-17 ENCOUNTER — Ambulatory Visit (INDEPENDENT_AMBULATORY_CARE_PROVIDER_SITE_OTHER): Payer: PPO

## 2017-11-17 ENCOUNTER — Other Ambulatory Visit: Payer: Self-pay | Admitting: *Deleted

## 2017-11-17 VITALS — BP 138/64 | HR 51 | Temp 98.0°F | Ht 62.0 in | Wt 176.0 lb

## 2017-11-17 DIAGNOSIS — Z23 Encounter for immunization: Secondary | ICD-10-CM

## 2017-11-17 DIAGNOSIS — F0150 Vascular dementia without behavioral disturbance: Secondary | ICD-10-CM

## 2017-11-17 DIAGNOSIS — Z0000 Encounter for general adult medical examination without abnormal findings: Secondary | ICD-10-CM

## 2017-11-17 DIAGNOSIS — E2839 Other primary ovarian failure: Secondary | ICD-10-CM | POA: Diagnosis not present

## 2017-11-17 MED ORDER — DONEPEZIL HCL 10 MG PO TABS: 10.0000 mg | ORAL_TABLET | Freq: Every day | ORAL | 3 refills | Status: DC

## 2017-11-17 NOTE — Progress Notes (Signed)
Subjective:   Susan [name] is a 77 y.o. female who presents for Medicare Annual (Subsequent) preventive examination.  Last AWV-11/05/2016                                                       Objective:     Vitals: BP 138/64 (BP Location: Left Arm, Patient Position: Sitting)   Pulse (!) 51   Temp 98 F (36.7 C) (Oral)   Ht 5\' 2"  (1.575 m)   Wt 176 lb (79.8 kg)   SpO2 98%   BMI 32.19 kg/m   Body mass index is 32.19 kg/m.  Advanced Directives 11/17/2017 07/01/2017 05/16/2017 04/13/2017 05/09/2016 11/20/2015 11/20/2015  Does Patient Have a Medical Advance Directive? Yes No Yes Yes Yes No No  Type of Paramedic of [place];Living will - Living will [place];Living will Living will - -  Does patient want to make changes to medical advance directive? Yes (MAU/Ambulatory/Procedural Areas - Information given) - No - Patient declined - No - Patient declined - -  Copy of [place] in Chart? No - copy requested - - - - - -  Would patient like information on creating a medical advance directive? - - - - - No - patient declined information No - patient declined information  Pre-existing out of facility DNR order (yellow form or pink MOST form) - - - - - - -    Tobacco Social History   Tobacco Use  Smoking Status Former Smoker  . Packs/day: 1.00  . Years: 30.00  . Pack years: 30.00  . Types: Cigarettes  . Last attempt to quit: 01/07/1978  . Years since quitting: 39.8  Smokeless Tobacco Never Used     Counseling given: Not Answered   Clinical Intake:  Pre-visit preparation completed: No  Pain : No/denies pain     Diabetes: No  How often do you need to have someone help you when you read instructions, pamphlets, or other written materials from your doctor or pharmacy?: 1 - Never What is the last grade level you completed in school?: Bachelors  Interpreter Needed?: No  Information entered by :: Tyson Dense,  RN  Past Medical History:  Diagnosis Date  . Anxiety   . Arthritis    "legs, back" (07/29/2012)  . Chronic diastolic (congestive) heart failure ([place])   . Chronic lower back pain   . Complication of anesthesia    "I have apnea" (07/29/2012)  . Dementia ([place])    per family  . Depression   . 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 Edema 02/09/2015  . Family history of anesthesia complication    "some wake up during OR; some are hard to wake up; some both" (07/29/2012)  . Gait abnormality 06/18/2016  . GERD (gastroesophageal reflux disease)   . 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 ([place])   . History of CVA (cerebrovascular accident) 02/01/2015  . History of stomach ulcers 1980's  . Hyperlipemia 06/18/2016  . Hyperlipidemia   . Hyperparathyroidism, primary ([place]) 04/22/2012  . Hypertensive heart disease   . Incontinent of urine    wears pads  . Major depressive disorder, recurrent episode, severe ([place]) 11/21/2015  . Medication management 10/11/2016  . OSA (obstructive sleep apnea)   . OSA  on CPAP   . Osteoarthritis of right knee   . Pedal edema   . Persistent atrial fibrillation    CHADS2VASC score is 7 - on chronic anticoagulation with Apixaban  . Primary hyperparathyroidism ([place])   . Short-term memory loss 01/28/2015  . Spinal stenosis   . Stroke ([place])   . Thyroid disease   . TIA (transient ischemic attack) 01/24/2015  . Umbilical hernia    "unrepaired" (07/29/2012)  . Varicose veins    "BLE" (07/29/2012)   Past Surgical History:  Procedure Laterality Date  . CATARACT EXTRACTION    . CHOLECYSTECTOMY  ~ 2010  . COLONOSCOPY    . IUD REMOVAL  1980's  . PARATHYROIDECTOMY N/A 06/16/2012   Procedure: NECK EXPLORATION AND LEFT SUPERIOR PARATHYROIDECTOMY;  Surgeon: Earnstine Regal, MD;  Location: [facility];  Service: General;  Laterality: N/A;  . 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 TOTAL KNEE ARTHROPLASTY Right 07/29/2012   Procedure: TOTAL KNEE  ARTHROPLASTY;  Surgeon: Ninetta Lights, MD;  Location: [facility];  Service: Orthopedics;  Laterality: Right;   Family History  Problem Relation Age of Onset  . Hypertension Mother        deceased  . Stroke Mother   . Breast cancer Mother   . Lung cancer Father        deceased  . Diabetes Daughter   . Hypertension Daughter    Social History   Socioeconomic History  . Marital status: Widowed    Spouse name: Not on file  . Number of children: Not on file  . Years of education: Not on file  . Highest education level: Not on file  Occupational History  . Occupation: retired    Fish farm manager: RETIRED  Social Needs  . Financial resource strain: Not hard at all  . Food insecurity:    Worry: Never true    Inability: Never true  . Transportation needs:    Medical: No    Non-medical: No  Tobacco Use  . Smoking status: Former Smoker    Packs/day: 1.00    Years: 30.00    Pack years: 30.00    Types: Cigarettes    Last attempt to quit: 01/07/1978    Years since quitting: 39.8  . Smokeless tobacco: Never Used  Substance and Sexual Activity  . Alcohol use: No  . Drug use: No  . Sexual activity: Not Currently    Comment: intercourse age 44,less than 5 secxual partners,  Lifestyle  . Physical activity:    Days per week: 3 days    Minutes per session: 30 min  . Stress: Rather much  Relationships  . Social connections:    Talks on phone: More than three times a week    Gets together: More than three times a week    Attends religious service: More than 4 times per year    Active member of club or organization: No    Attends meetings of clubs or organizations: Never    Relationship status: Widowed  Other Topics Concern  . Not on file  Social History Narrative   Lives at home alone   Right-handed   Caffeine: 1 cup of coffee per day         Diet:      Caffeine:      Married, if yes what year:       Do you live in a house, apartment, assisted living, condo, trailer, ect:       Pets:  Current/Past profession:      Exercise:         Living Will: Yes   DNR: No   POA/HPOA: Yes      Functional Status:   Do you have difficulty bathing or dressing yourself? Yes   Do you have difficulty preparing food or eating? No   Do you have difficulty managing your medications? Yes   Do you have difficulty managing your finances? Yes   Do you have difficulty affording your medications? Yes    Outpatient Encounter Medications as of 11/17/2017  Medication Sig  . acetaminophen (TYLENOL) 500 MG tablet Take 2 tablets (1,000 mg total) by mouth every 6 (six) hours as needed for moderate pain. Take 1000 mg tablet twice daily in the morning and evening and take 1 tablet ( 500 mg) by mouth daily at 2 pm.  . apixaban (ELIQUIS) 5 MG TABS tablet Take 1 tablet (5 mg total) by mouth every 12 (twelve) hours.  Marland Kitchen buPROPion (WELLBUTRIN XL) 150 MG 24 hr tablet Take 450 mg by mouth daily.  Marland Kitchen CALCIUM PO Take 1 tablet by mouth daily.  . carvedilol (COREG) 12.5 MG tablet Take 12.5 mg by mouth 2 (two) times daily with a meal.  . cholecalciferol (VITAMIN D) 1000 units tablet Take 1,000 Units by mouth daily.   . clobetasol ointment (TEMOVATE) 9.37 % Apply 1 application topically 2 (two) times daily.  . diclofenac sodium (VOLTAREN) 1 % GEL Apply 2 g topically 4 (four) times daily.  Marland Kitchen docusate sodium (COLACE) 100 MG capsule Take 100 mg by mouth daily.  Marland Kitchen donepezil (ARICEPT) 10 MG tablet Take 1 tablet (10 mg total) by mouth at bedtime. For memory loss  . furosemide (LASIX) 20 MG tablet Take 20 mg by mouth daily.  Marland Kitchen gabapentin (NEURONTIN) 300 MG capsule Take 300 mg by mouth 2 (two) times daily.   . hydrALAZINE (APRESOLINE) 50 MG tablet Take 1 tablet (50 mg total) by mouth 2 (two) times daily.  . iron polysaccharides (IFEREX 150) 150 MG capsule Take 150 mg by mouth daily.  Marland Kitchen losartan (COZAAR) 50 MG tablet Take 1 tablet (50 mg total) by mouth daily.  . Menthol, Topical Analgesic, (BIOFREEZE) 4 % GEL  Apply 3 oz topically 3 (three) times daily. For knee pain  . Multiple Vitamin (MULTIVITAMIN WITH MINERALS) TABS Take 1 tablet by mouth daily.  . pravastatin (PRAVACHOL) 40 MG tablet Take 40 mg by mouth at bedtime.   . sertraline (ZOLOFT) 25 MG tablet Take 0.5 tablets (12.5 mg total) by mouth 2 (two) times daily.  . vitamin B-12 (CYANOCOBALAMIN) 1000 MCG tablet Take 2,000 mcg by mouth daily.  Marland Kitchen Zoster Vaccine Adjuvanted [facility]) injection Inject 0.5 mLs into the muscle once.  . diclofenac sodium (VOLTAREN) 1 % GEL Apply 2 g topically 4 (four) times daily.  Marland Kitchen donepezil (ARICEPT) 10 MG tablet Take 1 tablet (10 mg total) by mouth at bedtime. For memory loss   No facility-administered encounter medications on file as of 11/17/2017.     Activities of Daily Living In your present state of health, do you have any difficulty performing the following activities: 11/17/2017 09/09/2017  Hearing? N Y  Vision? N N  Difficulty concentrating or making decisions? Tempie Donning  Walking or climbing stairs? Y Y  Dressing or bathing? N N  Doing errands, shopping? N Y  Conservation officer, nature and eating ? N -  Using the Toilet? N -  In the past six months, have you accidently leaked urine?  N -  Comment urgent, wears pads but no accidents -  Do you have problems with loss of bowel control? N -  Managing your Medications? N -  Managing your Finances? N -  Housekeeping or managing your Housekeeping? N -  Some recent data might be hidden    Patient Care Team: Gildardo Cranker, DO as PCP - General (Internal Medicine) Sueanne Margarita, MD as PCP - Cardiology (Cardiology)    Assessment:   This is a routine wellness examination for Symphanie.  Exercise Activities and Dietary recommendations Current Exercise Habits: Structured exercise class, Type of exercise: Other - see comments;strength training/weights(swiming, balance with trainer), Time (Minutes): 30, Frequency (Times/Week): 3, Weekly Exercise (Minutes/Week): 90, Intensity:  Mild, Exercise limited by: orthopedic condition(s)  Goals   None     Fall Risk Fall Risk  11/17/2017 10/21/2017 09/25/2017 09/09/2017 07/01/2017  Falls in the past year? 1 Yes Yes Yes Yes  Number falls in past yr: 1 2 or more 2 or more 1 2 or more  Injury with Fall? 0 No No No No  Comment - - - - -  Risk for fall due to : - - - - -  Follow up - - - - -  Comment - - - - -   Is the patient's home free of loose throw rugs in walkways, pet beds, electrical cords, etc?   yes      Grab bars in the bathroom? yes      Handrails on the stairs?   yes      Adequate lighting?   yes  Timed Get Up and Go performed: 18 seconds  Depression Screen PHQ 2/9 Scores 11/17/2017 09/09/2017 05/09/2016  PHQ - 2 Score 6 2 6   PHQ- 9 Score 15 11 -  Exception Documentation - - Other- indicate reason in comment box  Not completed - - Patient had to end our conversation. She was going to a meeting.      Cognitive Function completed within last year MMSE - Mini Mental State Exam 09/09/2017  Orientation to time 5  Orientation to Place 5  Registration 3  Attention/ Calculation 5  Recall 3  Language- name 2 objects 2  Language- repeat 1  Language- follow 3 step command 3  Language- read & follow direction 1  Write a sentence 0  Copy design 0  Total score 28        Immunization History  Administered Date(s) Administered  . Influenza Whole 10/02/2010  . Influenza, High Dose Seasonal PF 10/25/2016, 10/12/2017  . PPD Test 01/31/2015  . Pneumococcal Conjugate-13 11/17/2017  . Pneumococcal-Unspecified 01/08/2012  . Tdap 09/14/2017    Qualifies for Shingles Vaccine? Yes, educated and ordered to pharmacy  Screening Tests Health Maintenance  Topic Date Due  . DEXA SCAN  01/22/2003  . TETANUS/TDAP  09/15/2027  . INFLUENZA VACCINE  Completed  . PNA vac Low Risk Adult  Completed    Cancer Screenings: Lung: Low Dose CT Chest recommended if Age 63-80 years, 30 pack-year currently smoking OR have quit  w/in 15years. Patient does not qualify. Breast:  Up to date on Mammogram? Yes   Up to date of Bone Density/Dexa? No, ordered Colorectal: up to date  Additional Screenings:  Hepatitis C Screening: declined Hearing Screen completed Prevnar due: given    Plan:    I have personally reviewed and addressed the Medicare Annual Wellness questionnaire and have noted the following in the patient's chart:  A. Medical and social history  B. Use of alcohol, tobacco or illicit drugs  C. Current medications and supplements D. Functional ability and status E.  Nutritional status F.  Physical activity G. Advance directives H. List of other physicians I.  Hospitalizations, surgeries, and ER visits in previous 12 months J.  [place] to include hearing, vision, cognitive, depression L. Referrals and appointments - none  In addition, I have reviewed and discussed with patient certain preventive protocols, quality metrics, and best practice recommendations. A written personalized care plan for preventive services as well as general preventive health recommendations were provided to patient.  See attached scanned questionnaire for additional information.   Signed,   Tyson Dense, RN Nurse Health Advisor  Patient Concerns: Left hand tingles for the last 6 months

## 2017-11-17 NOTE — Patient Instructions (Addendum)
Susan [name] , Thank you for taking time to come for your Medicare Wellness Visit. I appreciate your ongoing commitment to your health goals. Please review the following plan we discussed and let me know if I can assist you in the future.   Screening recommendations/referrals: Colonoscopy excluded, over age 79 Mammogram excluded, over age 48 Bone Density due, ordered Recommended yearly ophthalmology/optometry visit for glaucoma screening and checkup Recommended yearly dental visit for hygiene and checkup  Vaccinations: Influenza vaccine up to date Pneumococcal vaccine due. Prevnar given today Tdap vaccine up to date, due 09/15/2027 Shingles vaccine due, prescription sent to pharmacy    Advanced directives: Advance directive discussed with you today. I have provided a copy for you to complete at home and have notarized. Once this is complete please bring a copy in to our office so we can scan it into your chart.  Conditions/risks identified: none  Next appointment: Susan [name] 01/26/2018 @ 10:15am                       Tyson Dense, RN 11/20/2018 @ 10am   Preventive Care 83 Years and Older, Female Preventive care refers to lifestyle choices and visits with your health care provider that can promote health and wellness. What does preventive care include?  A yearly physical exam. This is also called an annual well check.  Dental exams once or twice a year.  Routine eye exams. Ask your health care provider how often you should have your eyes checked.  Personal lifestyle choices, including:  Daily care of your teeth and gums.  Regular physical activity.  Eating a healthy diet.  Avoiding tobacco and drug use.  Limiting alcohol use.  Practicing safe sex.  Taking low-dose aspirin every day.  Taking vitamin and mineral supplements as recommended by your health care provider. What happens during an annual well check? The services and screenings done by your health care  provider during your annual well check will depend on your age, overall health, lifestyle risk factors, and family history of disease. Counseling  Your health care provider may ask you questions about your:  Alcohol use.  Tobacco use.  Drug use.  Emotional well-being.  Home and relationship well-being.  Sexual activity.  Eating habits.  History of falls.  Memory and ability to understand (cognition).  Work and work Statistician.  Reproductive health. Screening  You may have the following tests or measurements:  Height, weight, and BMI.  Blood pressure.  Lipid and cholesterol levels. These may be checked every 5 years, or more frequently if you are over 48 years old.  Skin check.  Lung cancer screening. You may have this screening every year starting at age 34 if you have a 30-pack-year history of smoking and currently smoke or have quit within the past 15 years.  Fecal occult blood test (FOBT) of the stool. You may have this test every year starting at age 67.  Flexible sigmoidoscopy or colonoscopy. You may have a sigmoidoscopy every 5 years or a colonoscopy every 10 years starting at age 56.  Hepatitis C blood test.  Hepatitis B blood test.  Sexually transmitted disease (STD) testing.  Diabetes screening. This is done by checking your blood sugar (glucose) after you have not eaten for a while (fasting). You may have this done every 1-3 years.  Bone density scan. This is done to screen for osteoporosis. You may have this done starting at age 20.  Mammogram. This may be done every  1-2 years. Talk to your health care provider about how often you should have regular mammograms. Talk with your health care provider about your test results, treatment options, and if necessary, the need for more tests. Vaccines  Your health care provider may recommend certain vaccines, such as:  Influenza vaccine. This is recommended every year.  Tetanus, diphtheria, and acellular  pertussis (Tdap, Td) vaccine. You may need a Td booster every 10 years.  Zoster vaccine. You may need this after age 52.  Pneumococcal 13-valent conjugate (PCV13) vaccine. One dose is recommended after age 71.  Pneumococcal polysaccharide (PPSV23) vaccine. One dose is recommended after age 43. Talk to your health care provider about which screenings and vaccines you need and how often you need them. This information is not intended to replace advice given to you by your health care provider. Make sure you discuss any questions you have with your health care provider. Document Released: 01/20/2015 Document Revised: 09/13/2015 Document Reviewed: 10/25/2014 Elsevier Interactive Patient Education  2017 [place] Prevention in the Home Falls can cause injuries. They can happen to people of all ages. There are many things you can do to make your home safe and to help prevent falls. What can I do on the outside of my home?  Regularly fix the edges of walkways and driveways and fix any cracks.  Remove anything that might make you trip as you walk through a door, such as a raised step or threshold.  Trim any bushes or trees on the path to your home.  Use bright outdoor lighting.  Clear any walking paths of anything that might make someone trip, such as rocks or tools.  Regularly check to see if handrails are loose or broken. Make sure that both sides of any steps have handrails.  Any raised decks and porches should have guardrails on the edges.  Have any leaves, snow, or ice cleared regularly.  Use sand or salt on walking paths during winter.  Clean up any spills in your garage right away. This includes oil or grease spills. What can I do in the bathroom?  Use night lights.  Install grab bars by the toilet and in the tub and shower. Do not use towel bars as grab bars.  Use non-skid mats or decals in the tub or shower.  If you need to sit down in the shower, use a plastic,  non-slip stool.  Keep the floor dry. Clean up any water that spills on the floor as soon as it happens.  Remove soap buildup in the tub or shower regularly.  Attach bath mats securely with double-sided non-slip rug tape.  Do not have throw rugs and other things on the floor that can make you trip. What can I do in the bedroom?  Use night lights.  Make sure that you have a light by your bed that is easy to reach.  Do not use any sheets or blankets that are too big for your bed. They should not hang down onto the floor.  Have a firm chair that has side arms. You can use this for support while you get dressed.  Do not have throw rugs and other things on the floor that can make you trip. What can I do in the kitchen?  Clean up any spills right away.  Avoid walking on wet floors.  Keep items that you use a lot in easy-to-reach places.  If you need to reach something above you, use a strong  step stool that has a grab bar.  Keep electrical cords out of the way.  Do not use floor polish or wax that makes floors slippery. If you must use wax, use non-skid floor wax.  Do not have throw rugs and other things on the floor that can make you trip. What can I do with my stairs?  Do not leave any items on the stairs.  Make sure that there are handrails on both sides of the stairs and use them. Fix handrails that are broken or loose. Make sure that handrails are as long as the stairways.  Check any carpeting to make sure that it is firmly attached to the stairs. Fix any carpet that is loose or worn.  Avoid having throw rugs at the top or bottom of the stairs. If you do have throw rugs, attach them to the floor with carpet tape.  Make sure that you have a light switch at the top of the stairs and the bottom of the stairs. If you do not have them, ask someone to add them for you. What else can I do to help prevent falls?  Wear shoes that:  Do not have high heels.  Have rubber  bottoms.  Are comfortable and fit you well.  Are closed at the toe. Do not wear sandals.  If you use a stepladder:  Make sure that it is fully opened. Do not climb a closed stepladder.  Make sure that both sides of the stepladder are locked into place.  Ask someone to hold it for you, if possible.  Clearly mark and make sure that you can see:  Any grab bars or handrails.  First and last steps.  Where the edge of each step is.  Use tools that help you move around (mobility aids) if they are needed. These include:  Canes.  Walkers.  Scooters.  Crutches.  Turn on the lights when you go into a dark area. Replace any light bulbs as soon as they burn out.  Set up your furniture so you have a clear path. Avoid moving your furniture around.  If any of your floors are uneven, fix them.  If there are any pets around you, be aware of where they are.  Review your medicines with your doctor. Some medicines can make you feel dizzy. This can increase your chance of falling. Ask your doctor what other things that you can do to help prevent falls. This information is not intended to replace advice given to you by your health care provider. Make sure you discuss any questions you have with your health care provider. Document Released: 10/20/2008 Document Revised: 06/01/2015 Document Reviewed: 01/28/2014 Elsevier Interactive Patient Education  2017 [employer].

## 2017-11-17 NOTE — Telephone Encounter (Signed)
[facility] 

## 2017-11-20 ENCOUNTER — Ambulatory Visit (INDEPENDENT_AMBULATORY_CARE_PROVIDER_SITE_OTHER): Payer: PPO | Admitting: Orthopaedic Surgery

## 2017-11-20 ENCOUNTER — Other Ambulatory Visit: Payer: Self-pay | Admitting: Nurse Practitioner

## 2017-11-20 DIAGNOSIS — E2839 Other primary ovarian failure: Secondary | ICD-10-CM

## 2017-11-25 ENCOUNTER — Encounter (INDEPENDENT_AMBULATORY_CARE_PROVIDER_SITE_OTHER): Payer: Self-pay | Admitting: Orthopaedic Surgery

## 2017-11-25 ENCOUNTER — Ambulatory Visit (INDEPENDENT_AMBULATORY_CARE_PROVIDER_SITE_OTHER): Payer: PPO | Admitting: Orthopaedic Surgery

## 2017-11-25 DIAGNOSIS — M1712 Unilateral primary osteoarthritis, left knee: Secondary | ICD-10-CM

## 2017-11-25 NOTE — Progress Notes (Signed)
Office Visit Note   Patient: Susan [name]           Date of Birth: [birth year]           MRN: [id number] Visit Date: 11/25/2017              Requested by: Gildardo Cranker, DO [place], [place] [address] PCP: Lauree Chandler, NP   Assessment & Plan: Visit Diagnoses:  1. Unilateral primary osteoarthritis, left knee     Plan: Impression is 77 year old female with end-stage left knee degenerative joint disease.  Patient has not gotten any real meaningful relief from nonsurgical treatments.  At this point she would like to move forward with a left total knee replacement.  We will need to obtain preoperative clearance from her cardiologist and PCP before doing so.  We will be in touch about scheduling.  Follow-Up Instructions: Return if symptoms worsen or fail to improve.   Orders:  No orders of the defined types were placed in this encounter.  No orders of the defined types were placed in this encounter.     Procedures: No procedures performed   Clinical Data: No additional findings.   Subjective: Chief Complaint  Patient presents with  . Left Knee - Pain    Susan [name] comes in today for continued left knee pain.  She states that the Voltaren gel helps but the cortisone injection has not helped.  She is interested in discussing total knee replacement.  She previously underwent right total knee replacement about 5 years ago which she did well from.   Review of Systems  Constitutional: Negative.   HENT: Negative.   Eyes: Negative.   Respiratory: Negative.   Cardiovascular: Negative.   Endocrine: Negative.   Musculoskeletal: Negative.   Neurological: Negative.   Hematological: Negative.   Psychiatric/Behavioral: Negative.   All other systems reviewed and are negative.    Objective: Vital Signs: There were no vitals taken for this visit.  Physical Exam  Constitutional: She is oriented to person, place, and time. She appears well-developed and  well-nourished.  Pulmonary/Chest: Effort normal.  Neurological: She is alert and oriented to person, place, and time.  Skin: Skin is warm. Capillary refill takes less than 2 seconds.  Psychiatric: She has a normal mood and affect. Her behavior is normal. Judgment and thought content normal.  Nursing note and vitals reviewed.   Ortho Exam Left knee exam is stable. Specialty Comments:  No specialty comments available.  Imaging: No results found.   PMFS History: Patient Active Problem List   Diagnosis Date Noted  . Medication management 10/11/2016  . Hyperlipemia 06/18/2016  . Gait abnormality 06/18/2016  . Major depressive disorder, recurrent episode, severe ([place]) 11/21/2015  . Edema 02/09/2015  . History of CVA (cerebrovascular accident) 02/01/2015  . 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 ([place])   . 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 Chronic low back pain 01/28/2015  . Short-term memory loss 01/28/2015  . Volume depletion 01/28/2015  . Persistent atrial fibrillation 01/26/2015  . DCM (dilated cardiomyopathy) ([place]) 01/26/2015  . TIA (transient ischemic attack) 01/24/2015  . Chronic diastolic (congestive) heart failure ([place])   . Obesity (BMI 30-39.9) 01/27/2013  . Constipation 09/02/2012  . H/O arthroscopy of right knee 08/06/2012  . Anxiety   . Depression   . GERD (gastroesophageal reflux disease)   . Hypertensive heart disease   . OSA (obstructive sleep apnea)   . Osteoarthritis of right  knee   . Hyperparathyroidism, primary ([place]) 04/22/2012  . Chronic cough 10/02/2011   Past Medical History:  Diagnosis Date  . Anxiety   . Arthritis    "legs, back" (07/29/2012)  . Chronic diastolic (congestive) heart failure ([place])   . Chronic lower back pain   . Complication of anesthesia    "I have apnea" (07/29/2012)  . Dementia ([place])    per family  . Depression   . 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 Edema 02/09/2015    . Family history of anesthesia complication    "some wake up during OR; some are hard to wake up; some both" (07/29/2012)  . Gait abnormality 06/18/2016  . GERD (gastroesophageal reflux disease)   . 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 ([place])   . History of CVA (cerebrovascular accident) 02/01/2015  . History of stomach ulcers 1980's  . Hyperlipemia 06/18/2016  . Hyperlipidemia   . Hyperparathyroidism, primary ([place]) 04/22/2012  . Hypertensive heart disease   . Incontinent of urine    wears pads  . Major depressive disorder, recurrent episode, severe ([place]) 11/21/2015  . Medication management 10/11/2016  . OSA (obstructive sleep apnea)   . OSA on CPAP   . Osteoarthritis of right knee   . Pedal edema   . Persistent atrial fibrillation    CHADS2VASC score is 7 - on chronic anticoagulation with Apixaban  . Primary hyperparathyroidism ([place])   . Short-term memory loss 01/28/2015  . Spinal stenosis   . Stroke ([place])   . Thyroid disease   . TIA (transient ischemic attack) 01/24/2015  . Umbilical hernia    "unrepaired" (07/29/2012)  . Varicose veins    "BLE" (07/29/2012)    Family History  Problem Relation Age of Onset  . Hypertension Mother        deceased  . Stroke Mother   . Breast cancer Mother   . Lung cancer Father        deceased  . Diabetes Daughter   . Hypertension Daughter     Past Surgical History:  Procedure Laterality Date  . CATARACT EXTRACTION    . CHOLECYSTECTOMY  ~ 2010  . COLONOSCOPY    . IUD REMOVAL  1980's  . PARATHYROIDECTOMY N/A 06/16/2012   Procedure: NECK EXPLORATION AND LEFT SUPERIOR PARATHYROIDECTOMY;  Surgeon: Earnstine Regal, MD;  Location: [facility];  Service: General;  Laterality: N/A;  . 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 TOTAL KNEE ARTHROPLASTY Right 07/29/2012   Procedure: TOTAL KNEE ARTHROPLASTY;  Surgeon: Ninetta Lights, MD;  Location: [facility];  Service: Orthopedics;  Laterality: Right;   Social History   Occupational  History  . Occupation: retired    Fish farm manager: RETIRED  Tobacco Use  . Smoking status: Former Smoker    Packs/day: 1.00    Years: 30.00    Pack years: 30.00    Types: Cigarettes    Last attempt to quit: 01/07/1978    Years since quitting: 39.9  . Smokeless tobacco: Never Used  Substance and Sexual Activity  . Alcohol use: No  . Drug use: No  . Sexual activity: Not Currently    Comment: intercourse age 31,less than 5 secxual partners,

## 2017-12-09 DIAGNOSIS — F339 Major depressive disorder, recurrent, unspecified: Secondary | ICD-10-CM | POA: Diagnosis not present

## 2017-12-10 ENCOUNTER — Telehealth: Payer: Self-pay | Admitting: *Deleted

## 2017-12-10 NOTE — Telephone Encounter (Signed)
   [facility] Pre-operative Risk Assessment    Request for surgical clearance:  1. What type of surgery is being performed? LEFT TOTAL KNEE ARTHROPLASTY   2. When is this surgery scheduled?  TBD (WAITING ON CLEARANCE)   3. What type of clearance is required (medical clearance vs. Pharmacy clearance to hold med vs. Both)?  BOTH  4. Are there any medications that need to be held prior to surgery and how long? ELIQUIS   5. Practice name and name of physician performing surgery?  [facility]   6. What is your office phone number [phone]    7.   What is your office fax number [phone]  8.   Anesthesia type (None, local, MAC, general) ?  LEFT MESSAGE FOR OFFICE TO CALL BACK WITH THIS INFORMATION    Jeanann Lewandowsky 12/10/2017, 1:47 PM  _________________________________________________________________   (provider comments below)

## 2017-12-12 ENCOUNTER — Encounter: Payer: Self-pay | Admitting: Nurse Practitioner

## 2017-12-12 NOTE — Telephone Encounter (Signed)
Needs to be bridged

## 2017-12-12 NOTE — Telephone Encounter (Signed)
Patient with diagnosis of atrial fibrillation on Eliquis for anticoagulation.    Procedure: left total knee arthroplasty Date of procedure: TBD  CHADS2-VASc score of  7 (CHF, HTN, AGE, , stroke/tia x 2, , AGE, female)  CrCl 39.4 Platelet count 229  Because of high CHADS2-VASc score and need to hold anticoagulation for 3 days with spinal anesthesia, will defer to Dr. Radford Pax on how long patient can safely hold Eliquis

## 2017-12-15 NOTE — Telephone Encounter (Signed)
Per staff message below:     Message Contents  Lorre Nick, RMA        In response to your voice mail, anesthesia is Spinal + block.   Thanks!

## 2017-12-15 NOTE — Telephone Encounter (Signed)
   Primary Cardiologist: Fransico Him, MD  Chart reviewed as part of pre-operative protocol coverage. Patient was contacted 12/15/2017 in reference to pre-operative risk assessment for pending surgery as outlined below.  Susan [name] was last seen on 06/24/2017 by by Dr. Radford Pax.  Since that day, Susan [name] has done well with no cardiac complaints.  She has a history of reduced EF which has normalized according to echo in 04/2015.  She is currently exercising at the [facility] on the elliptical and seated bike for about 10 minutes at least once a week and uses the bike where she lives at least once a week.  She also does balance exercises.  She takes public transportation and has been able to get out and shop and go to the [facility] without any exertional symptoms. She has had no symptoms of volume overload.   Risk of major cardiac event is 6.6% which is moderate risk for major cardiac event mainly due to h/o CHF and CVA.   Therefore, based on ACC/AHA guidelines, the patient would be at acceptable risk for the planned procedure without further cardiovascular testing.   According to our pharmacist:        Patient with diagnosis of atrial fibrillation on Eliquis for anticoagulation.    Procedure: left total knee arthroplasty Date of procedure: TBD  CHADS2-VASc score of  7 (CHF, HTN, AGE, , stroke/tia x 2, , AGE, female)  CrCl 39.4 Platelet count 229  Because of high CHADS2-VASc score and need to hold anticoagulation for 3 days with spinal anesthesia  Dr. Radford Pax feels that it is necessary for the patient to be bridged while off Eliquis. Our pharmacy will make arrangements.       I will route this recommendation to the requesting party via Epic fax function and remove from pre-op pool.  Please call with questions.  Daune Perch, NP 12/15/2017, 3:34 PM

## 2017-12-16 DIAGNOSIS — F339 Major depressive disorder, recurrent, unspecified: Secondary | ICD-10-CM | POA: Diagnosis not present

## 2017-12-17 ENCOUNTER — Ambulatory Visit (INDEPENDENT_AMBULATORY_CARE_PROVIDER_SITE_OTHER): Payer: PPO | Admitting: Nurse Practitioner

## 2017-12-17 ENCOUNTER — Encounter: Payer: Self-pay | Admitting: Nurse Practitioner

## 2017-12-17 VITALS — BP 124/78 | HR 62 | Temp 98.3°F | Ht 62.0 in | Wt 188.8 lb

## 2017-12-17 DIAGNOSIS — Z8673 Personal history of transient ischemic attack (TIA), and cerebral infarction without residual deficits: Secondary | ICD-10-CM

## 2017-12-17 DIAGNOSIS — R739 Hyperglycemia, unspecified: Secondary | ICD-10-CM | POA: Diagnosis not present

## 2017-12-17 DIAGNOSIS — M1712 Unilateral primary osteoarthritis, left knee: Secondary | ICD-10-CM

## 2017-12-17 DIAGNOSIS — Z01818 Encounter for other preprocedural examination: Secondary | ICD-10-CM

## 2017-12-17 DIAGNOSIS — F0150 Vascular dementia without behavioral disturbance: Secondary | ICD-10-CM | POA: Diagnosis not present

## 2017-12-17 DIAGNOSIS — R0982 Postnasal drip: Secondary | ICD-10-CM

## 2017-12-17 DIAGNOSIS — I5032 Chronic diastolic (congestive) heart failure: Secondary | ICD-10-CM | POA: Diagnosis not present

## 2017-12-17 DIAGNOSIS — N183 Chronic kidney disease, stage 3 unspecified: Secondary | ICD-10-CM

## 2017-12-17 NOTE — Progress Notes (Signed)
Careteam: Patient Care Team: Lauree Chandler, NP as PCP - General (Geriatric Medicine) Sueanne Margarita, MD as PCP - Cardiology (Cardiology)  Advanced Directive information Does Patient Have a Medical Advance Directive?: No  Allergies  Allergen Reactions  . Ace Inhibitors Cough  . Lipitor [Atorvastatin] Swelling  . Simvastatin Other (See Comments)    Myalgias     . Latex Itching    Chief Complaint  Patient presents with  . Acute Visit    Pt is being seen for sugical clearance. Pt is to have left total knee arthroplasty. surgery has not been scheduled yet.      HPI: Patient is a 77 y.o. female seen in the office today for surgery clearance.  Pt with hx of Chronic diastolic HF,persistent afib,HTN, OSA on CPAP, dementia, OA, anxiety and depression, CVA with right hemiparesis. She plans to get a left total knee replacement due to pain that has not been relieved by medication and injections.  She states that cardiology called her and cleared her over the phone. See phone note 12/10/17- "Risk of major cardiac event is 6.6% which is moderate risk for major cardiac event mainly due to h/o CHF and CVA.  Therefore, based on ACC/AHA guidelines, the patient would be at acceptable risk for the planned procedure without further cardiovascular testing." She has already been in touch with pharmacist for bridging eliquis to heparin.  Pt reports "will "be awake" during sugery, getting a spinal block and relaxation during procedure which will reduce risk"  Pt with dementia and reeports she needs help with medication and reminders and remembering to make doctors appts.  Gets pill pack from [facility] routinely. Writes a lot of things down to help her remember.   No chest pains or shortness of breath, no headaches, blurred vision. No changes in bowel or bladder habits.  Reports some post nasal drip that has been ongoing.   Review of Systems:  Review of Systems  Constitutional: Negative for  chills, fever and weight loss.  HENT: Positive for congestion. Negative for sinus pain, sore throat and tinnitus.   Respiratory: Positive for cough (for about 4 month, reports due to post nasal drip). Negative for sputum production and shortness of breath.   Cardiovascular: Negative for chest pain, palpitations and leg swelling.  Gastrointestinal: Negative for abdominal pain, constipation, diarrhea and heartburn.  Genitourinary: Positive for frequency and urgency. Negative for dysuria.       Pt with urinary incontinence   Musculoskeletal: Positive for joint pain. Negative for back pain, falls and myalgias.  Skin: Negative.   Neurological: Negative for dizziness, tingling and headaches.  Psychiatric/Behavioral: Positive for depression (baseline, No HI/SI) and memory loss.    Past Medical History:  Diagnosis Date  . Anxiety   . Arthritis    "legs, back" (07/29/2012)  . Chronic diastolic (congestive) heart failure ([place])   . Chronic lower back pain   . Complication of anesthesia    "I have apnea" (07/29/2012)  . Dementia ([place])    per family  . Depression   . 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 Edema 02/09/2015  . Family history of anesthesia complication    "some wake up during OR; some are hard to wake up; some both" (07/29/2012)  . Gait abnormality 06/18/2016  . GERD (gastroesophageal reflux disease)   . 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 ([place])   . History of CVA (cerebrovascular accident) 02/01/2015  . History of stomach ulcers  1980's  . Hyperlipemia 06/18/2016  . Hyperlipidemia   . Hyperparathyroidism, primary ([place]) 04/22/2012  . Hypertensive heart disease   . Incontinent of urine    wears pads  . Major depressive disorder, recurrent episode, severe ([place]) 11/21/2015  . Medication management 10/11/2016  . OSA (obstructive sleep apnea)   . OSA on CPAP   . Osteoarthritis of right knee   . Pedal edema   . Persistent atrial fibrillation     CHADS2VASC score is 7 - on chronic anticoagulation with Apixaban  . Primary hyperparathyroidism ([place])   . Short-term memory loss 01/28/2015  . Spinal stenosis   . Stroke ([place])   . Thyroid disease   . TIA (transient ischemic attack) 01/24/2015  . Umbilical hernia    "unrepaired" (07/29/2012)  . Varicose veins    "BLE" (07/29/2012)   Past Surgical History:  Procedure Laterality Date  . CATARACT EXTRACTION    . CHOLECYSTECTOMY  ~ 2010  . COLONOSCOPY    . IUD REMOVAL  1980's  . PARATHYROIDECTOMY N/A 06/16/2012   Procedure: NECK EXPLORATION AND LEFT SUPERIOR PARATHYROIDECTOMY;  Surgeon: Earnstine Regal, MD;  Location: [facility];  Service: General;  Laterality: N/A;  . 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 TOTAL KNEE ARTHROPLASTY Right 07/29/2012   Procedure: TOTAL KNEE ARTHROPLASTY;  Surgeon: Ninetta Lights, MD;  Location: [facility];  Service: Orthopedics;  Laterality: Right;   Social History:   reports that she quit smoking about 39 years ago. Her smoking use included cigarettes. She has a 30.00 pack-year smoking history. She has never used smokeless tobacco. She reports that she does not drink alcohol or use drugs.  Family History  Problem Relation Age of Onset  . Hypertension Mother        deceased  . Stroke Mother   . Breast cancer Mother   . Lung cancer Father        deceased  . Diabetes Daughter   . Hypertension Daughter     Medications: Patient's Medications  New Prescriptions   No medications on file  Previous Medications   ACETAMINOPHEN (TYLENOL) 500 MG TABLET    Take 2 tablets (1,000 mg total) by mouth every 6 (six) hours as needed for moderate pain. Take 1000 mg tablet twice daily in the morning and evening and take 1 tablet ( 500 mg) by mouth daily at 2 pm.   APIXABAN (ELIQUIS) 5 MG TABS TABLET    Take 1 tablet (5 mg total) by mouth every 12 (twelve) hours.   BUPROPION (WELLBUTRIN XL) 150 MG 24 HR TABLET    Take 450 mg by mouth daily.   CALCIUM PO    Take 1 tablet by  mouth daily.   CARVEDILOL (COREG) 12.5 MG TABLET    Take 12.5 mg by mouth 2 (two) times daily with a meal.   CHOLECALCIFEROL (VITAMIN D) 1000 UNITS TABLET    Take 1,000 Units by mouth daily.    DICLOFENAC SODIUM (VOLTAREN) 1 % GEL    Apply 2 g topically 4 (four) times daily.   DOCUSATE SODIUM (COLACE) 100 MG CAPSULE    Take 100 mg by mouth daily.   DONEPEZIL (ARICEPT) 10 MG TABLET    Take 1 tablet (10 mg total) by mouth at bedtime. For memory loss   FUROSEMIDE (LASIX) 20 MG TABLET    Take 20 mg by mouth daily.   GABAPENTIN (NEURONTIN) 300 MG CAPSULE    Take 300 mg by mouth 2 (two) times daily.  HYDRALAZINE (APRESOLINE) 50 MG TABLET    Take 1 tablet (50 mg total) by mouth 2 (two) times daily.   IRON POLYSACCHARIDES (IFEREX 150) 150 MG CAPSULE    Take 150 mg by mouth daily.   LOSARTAN (COZAAR) 50 MG TABLET    Take 1 tablet (50 mg total) by mouth daily.   MENTHOL, TOPICAL ANALGESIC, (BIOFREEZE) 4 % GEL    Apply 3 oz topically 3 (three) times daily. For knee pain   MULTIPLE VITAMIN (MULTIVITAMIN WITH MINERALS) TABS    Take 1 tablet by mouth daily.   PRAVASTATIN (PRAVACHOL) 40 MG TABLET    Take 40 mg by mouth at bedtime.    SERTRALINE (ZOLOFT) 25 MG TABLET    Take 0.5 tablets (12.5 mg total) by mouth 2 (two) times daily.   VITAMIN B-12 (CYANOCOBALAMIN) 1000 MCG TABLET    Take 2,000 mcg by mouth daily.  Modified Medications   No medications on file  Discontinued Medications   CLOBETASOL OINTMENT (TEMOVATE) 0.05 %    Apply 1 application topically 2 (two) times daily.   DICLOFENAC SODIUM (VOLTAREN) 1 % GEL    Apply 2 g topically 4 (four) times daily.   ZOSTER VACCINE ADJUVANTED [facility]) INJECTION    Inject 0.5 mLs into the muscle once.     Physical Exam:  Vitals:   12/17/17 0915  BP: 124/78  Pulse: 62  Temp: 98.3 F (36.8 C)  TempSrc: Oral  SpO2: 98%  Weight: 188 lb 12.8 oz (85.6 kg)  Height: 5\' 2"  (1.575 m)   Body mass index is 34.53 kg/m.  Physical Exam Constitutional:       Appearance: She is well-developed.  HENT:     Head: Normocephalic and atraumatic.     Mouth/Throat:     Mouth: Mucous membranes are moist.     Pharynx: Oropharynx is clear.  Eyes:     Extraocular Movements: Extraocular movements intact.     Conjunctiva/sclera: Conjunctivae normal.     Pupils: Pupils are equal, round, and reactive to light.  Neck:     Musculoskeletal: Normal range of motion and neck supple.  Cardiovascular:     Rate and Rhythm: Normal rate and regular rhythm.     Heart sounds: Murmur (1/6 SEM) present. No friction rub. No gallop.      Comments: Trace LE edema b/l. No calf TTP Pulmonary:     Effort: Pulmonary effort is normal. No respiratory distress.     Breath sounds: Normal breath sounds. No stridor. No wheezing or rales.  Chest:     Chest wall: No tenderness.  Musculoskeletal:        General: Tenderness present.  Skin:    General: Skin is warm and dry.     Findings: No rash.  Neurological:     Mental Status: She is alert.     Gait: Gait abnormal (antalgic; unsteady; uses rolling walker with seat and brakes).  Psychiatric:        Behavior: Behavior normal.        Thought Content: Thought content normal.     Labs reviewed: Basic Metabolic Panel: Recent Labs    02/20/17 1101 02/26/17 0844 09/05/17 0803  NA 142 143 142  K 4.0 3.9 4.2  CL 106 107* 109  CO2 23 22 26   GLUCOSE 93 108* 87  BUN 26 20 24   CREATININE 1.45* 1.48* 1.46*  CALCIUM 10.0 10.1 10.3  TSH  --   --  1.95   Liver Function Tests: Recent Labs  09/05/17 0803  AST 21  ALT 14  BILITOT 0.3  PROT 6.4   No results for input(s): LIPASE, AMYLASE in the last 8760 hours. No results for input(s): AMMONIA in the last 8760 hours. CBC: Recent Labs    02/20/17 1101 06/05/17 1413 09/05/17 0803  WBC 4.1 3.5* 2.9*  NEUTROABS  --  2,090 1,589  HGB 9.3* 8.9* 9.6*  HCT 29.3* 27.4* 28.6*  MCV 82 85.4 89.1  PLT 297 224 229   Lipid Panel: Recent Labs    09/05/17 0803  CHOL 199    HDL 65  LDLCALC 117*  TRIG 73  CHOLHDL 3.1   TSH: Recent Labs    09/05/17 0803  TSH 1.95   A1C: Lab Results  Component Value Date   HGBA1C 6.1 (H) 01/25/2015     Assessment/Plan 1. Pre-op evaluation -she has already been cleared by a cardiac stand point. She is at baseline medically at this time. Caution advised due to CKD and dementia with risk of delirium. She has already been in touch with pharmacy for bridge from eliquis to heparin,  She had right total knee in 2014 and tolerated well. It has been determined by cardiology she is moderate cardiac risk.  - COMPLETE METABOLIC PANEL WITH GFR - CBC with Differential/Platelets  2. Vascular dementia without behavioral disturbance (HCC) Ongoing, has tolerated aricept, functional does well with increase assistance from family; she is at high risk for post-op delirium due to dementia  3. Hyperglycemia - Hemoglobin A1c  4. Primary osteoarthritis of left knee -failed conservative management, now being considered for a TKR  5. History of CVA (cerebrovascular accident) Stable with right sided hemiparesis.  6. Chronic diastolic (congestive) heart failure (HCC) Stable, euvolemic at this time.  7. Post-nasal drip To use clartitin or zyrtec 10 mg by mouth daily   8. CKD (chronic kidney disease) stage 3, GFR 30-59 ml/min (HCC) -will follow up lab today, caution advised as she goes into procedure.   Next appt: 01/22/2018 [facility]. [place], [facility] [phone]

## 2017-12-17 NOTE — Patient Instructions (Signed)
To try Claritin or Zyrtec (these are brand names you can get generic) 10 mg by mouth daily for nasal congestion

## 2017-12-18 LAB — CBC WITH DIFFERENTIAL/PLATELET
BASOS PCT: 1 %
Basophils Absolute: 29 cells/uL (ref 0–200)
EOS ABS: 131 {cells}/uL (ref 15–500)
Eosinophils Relative: 4.5 %
HCT: 30 % — ABNORMAL LOW (ref 35.0–45.0)
HEMOGLOBIN: 9.5 g/dL — AB (ref 11.7–15.5)
LYMPHS ABS: 737 {cells}/uL — AB (ref 850–3900)
MCH: 29.6 pg (ref 27.0–33.0)
MCHC: 31.7 g/dL — AB (ref 32.0–36.0)
MCV: 93.5 fL (ref 80.0–100.0)
MPV: 10.2 fL (ref 7.5–12.5)
Monocytes Relative: 13.9 %
Neutro Abs: 1601 cells/uL (ref 1500–7800)
Neutrophils Relative %: 55.2 %
PLATELETS: 235 10*3/uL (ref 140–400)
RBC: 3.21 10*6/uL — AB (ref 3.80–5.10)
RDW: 12.3 % (ref 11.0–15.0)
TOTAL LYMPHOCYTE: 25.4 %
WBC mixed population: 403 cells/uL (ref 200–950)
WBC: 2.9 10*3/uL — ABNORMAL LOW (ref 3.8–10.8)

## 2017-12-18 LAB — COMPLETE METABOLIC PANEL WITH GFR
AG RATIO: 1.3 (calc) (ref 1.0–2.5)
ALT: 21 U/L (ref 6–29)
AST: 31 U/L (ref 10–35)
Albumin: 3.6 g/dL (ref 3.6–5.1)
Alkaline phosphatase (APISO): 62 U/L (ref 33–130)
BUN / CREAT RATIO: 13 (calc) (ref 6–22)
BUN: 19 mg/dL (ref 7–25)
CO2: 26 mmol/L (ref 20–32)
CREATININE: 1.51 mg/dL — AB (ref 0.60–0.93)
Calcium: 9.8 mg/dL (ref 8.6–10.4)
Chloride: 110 mmol/L (ref 98–110)
GFR, EST NON AFRICAN AMERICAN: 33 mL/min/{1.73_m2} — AB (ref >=60)
GFR, Est African American: 38 mL/min/{1.73_m2} — ABNORMAL LOW (ref >=60)
GLOBULIN: 2.7 g/dL (ref 1.9–3.7)
Glucose, Bld: 118 mg/dL (ref 65–139)
POTASSIUM: 4 mmol/L (ref 3.5–5.3)
SODIUM: 142 mmol/L (ref 135–146)
TOTAL PROTEIN: 6.3 g/dL (ref 6.1–8.1)
Total Bilirubin: 0.3 mg/dL (ref 0.2–1.2)

## 2017-12-18 LAB — HEMOGLOBIN A1C
Hgb A1c MFr Bld: 5.7 % of total Hgb — ABNORMAL HIGH (ref <5.7)
Mean Plasma Glucose: 117 (calc)
eAG (mmol/L): 6.5 (calc)

## 2017-12-18 NOTE — Telephone Encounter (Signed)
Susan [name] - here is another patient (Dr. Radford Pax) who will need to be bridged from Eliquis --> lovenox.  Again no date set.

## 2017-12-19 ENCOUNTER — Ambulatory Visit
Admission: RE | Admit: 2017-12-19 | Discharge: 2017-12-19 | Disposition: A | Discharge status: Home / Self Care | Payer: PPO | Source: Ambulatory Visit | Attending: Nurse Practitioner | Admitting: Nurse Practitioner

## 2017-12-19 DIAGNOSIS — E2839 Other primary ovarian failure: Secondary | ICD-10-CM

## 2017-12-19 DIAGNOSIS — M8589 Other specified disorders of bone density and structure, multiple sites: Secondary | ICD-10-CM | POA: Diagnosis not present

## 2017-12-19 DIAGNOSIS — Z780 Asymptomatic menopausal state: Secondary | ICD-10-CM | POA: Diagnosis not present

## 2017-12-21 ENCOUNTER — Emergency Department (HOSPITAL_BASED_OUTPATIENT_CLINIC_OR_DEPARTMENT_OTHER): Payer: PPO

## 2017-12-21 ENCOUNTER — Emergency Department (HOSPITAL_COMMUNITY): Payer: PPO

## 2017-12-21 ENCOUNTER — Encounter (HOSPITAL_COMMUNITY): Payer: Self-pay | Admitting: Emergency Medicine

## 2017-12-21 ENCOUNTER — Inpatient Hospital Stay (HOSPITAL_COMMUNITY): Payer: PPO

## 2017-12-21 ENCOUNTER — Inpatient Hospital Stay (HOSPITAL_COMMUNITY)
Admission: EM | Admit: 2017-12-21 | Discharge: 2017-12-25 | DRG: 871 | Disposition: A | Discharge status: Skilled Nursing Facility | Payer: PPO | Attending: Internal Medicine | Admitting: Internal Medicine

## 2017-12-21 ENCOUNTER — Other Ambulatory Visit: Payer: Self-pay

## 2017-12-21 DIAGNOSIS — K219 Gastro-esophageal reflux disease without esophagitis: Secondary | ICD-10-CM | POA: Diagnosis not present

## 2017-12-21 DIAGNOSIS — R6520 Severe sepsis without septic shock: Secondary | ICD-10-CM | POA: Diagnosis not present

## 2017-12-21 DIAGNOSIS — F419 Anxiety disorder, unspecified: Secondary | ICD-10-CM | POA: Diagnosis present

## 2017-12-21 DIAGNOSIS — E210 Primary hyperparathyroidism: Secondary | ICD-10-CM | POA: Diagnosis present

## 2017-12-21 DIAGNOSIS — I4819 Other persistent atrial fibrillation: Secondary | ICD-10-CM | POA: Diagnosis present

## 2017-12-21 DIAGNOSIS — S299XXA Unspecified injury of thorax, initial encounter: Secondary | ICD-10-CM | POA: Diagnosis not present

## 2017-12-21 DIAGNOSIS — M7989 Other specified soft tissue disorders: Secondary | ICD-10-CM | POA: Diagnosis not present

## 2017-12-21 DIAGNOSIS — L03116 Cellulitis of left lower limb: Secondary | ICD-10-CM | POA: Diagnosis not present

## 2017-12-21 DIAGNOSIS — M25559 Pain in unspecified hip: Secondary | ICD-10-CM

## 2017-12-21 DIAGNOSIS — R2689 Other abnormalities of gait and mobility: Secondary | ICD-10-CM | POA: Diagnosis not present

## 2017-12-21 DIAGNOSIS — S79911A Unspecified injury of right hip, initial encounter: Secondary | ICD-10-CM | POA: Diagnosis not present

## 2017-12-21 DIAGNOSIS — R0689 Other abnormalities of breathing: Secondary | ICD-10-CM | POA: Diagnosis not present

## 2017-12-21 DIAGNOSIS — N170 Acute kidney failure with tubular necrosis: Secondary | ICD-10-CM

## 2017-12-21 DIAGNOSIS — I69922 Dysarthria following unspecified cerebrovascular disease: Secondary | ICD-10-CM

## 2017-12-21 DIAGNOSIS — Z91040 Latex allergy status: Secondary | ICD-10-CM

## 2017-12-21 DIAGNOSIS — S199XXA Unspecified injury of neck, initial encounter: Secondary | ICD-10-CM | POA: Diagnosis not present

## 2017-12-21 DIAGNOSIS — E785 Hyperlipidemia, unspecified: Secondary | ICD-10-CM | POA: Diagnosis not present

## 2017-12-21 DIAGNOSIS — G9341 Metabolic encephalopathy: Secondary | ICD-10-CM | POA: Diagnosis not present

## 2017-12-21 DIAGNOSIS — I130 Hypertensive heart and chronic kidney disease with heart failure and stage 1 through stage 4 chronic kidney disease, or unspecified chronic kidney disease: Secondary | ICD-10-CM | POA: Diagnosis not present

## 2017-12-21 DIAGNOSIS — R531 Weakness: Secondary | ICD-10-CM | POA: Diagnosis not present

## 2017-12-21 DIAGNOSIS — Z79899 Other long term (current) drug therapy: Secondary | ICD-10-CM | POA: Diagnosis not present

## 2017-12-21 DIAGNOSIS — S99912A Unspecified injury of left ankle, initial encounter: Secondary | ICD-10-CM | POA: Diagnosis not present

## 2017-12-21 DIAGNOSIS — I5032 Chronic diastolic (congestive) heart failure: Secondary | ICD-10-CM | POA: Diagnosis present

## 2017-12-21 DIAGNOSIS — Z7901 Long term (current) use of anticoagulants: Secondary | ICD-10-CM | POA: Diagnosis not present

## 2017-12-21 DIAGNOSIS — F329 Major depressive disorder, single episode, unspecified: Secondary | ICD-10-CM | POA: Diagnosis present

## 2017-12-21 DIAGNOSIS — A419 Sepsis, unspecified organism: Secondary | ICD-10-CM | POA: Diagnosis not present

## 2017-12-21 DIAGNOSIS — Z8711 Personal history of peptic ulcer disease: Secondary | ICD-10-CM | POA: Diagnosis not present

## 2017-12-21 DIAGNOSIS — Z888 Allergy status to other drugs, medicaments and biological substances status: Secondary | ICD-10-CM | POA: Diagnosis not present

## 2017-12-21 DIAGNOSIS — M25562 Pain in left knee: Secondary | ICD-10-CM | POA: Diagnosis not present

## 2017-12-21 DIAGNOSIS — Z87891 Personal history of nicotine dependence: Secondary | ICD-10-CM | POA: Diagnosis not present

## 2017-12-21 DIAGNOSIS — L0390 Cellulitis, unspecified: Secondary | ICD-10-CM | POA: Diagnosis not present

## 2017-12-21 DIAGNOSIS — Z96651 Presence of right artificial knee joint: Secondary | ICD-10-CM | POA: Diagnosis present

## 2017-12-21 DIAGNOSIS — M5489 Other dorsalgia: Secondary | ICD-10-CM | POA: Diagnosis not present

## 2017-12-21 DIAGNOSIS — N183 Chronic kidney disease, stage 3 (moderate): Secondary | ICD-10-CM | POA: Diagnosis not present

## 2017-12-21 DIAGNOSIS — I69354 Hemiplegia and hemiparesis following cerebral infarction affecting left non-dominant side: Secondary | ICD-10-CM

## 2017-12-21 DIAGNOSIS — F0390 Unspecified dementia without behavioral disturbance: Secondary | ICD-10-CM | POA: Diagnosis not present

## 2017-12-21 DIAGNOSIS — M25473 Effusion, unspecified ankle: Secondary | ICD-10-CM | POA: Diagnosis not present

## 2017-12-21 DIAGNOSIS — Z8673 Personal history of transient ischemic attack (TIA), and cerebral infarction without residual deficits: Secondary | ICD-10-CM | POA: Diagnosis not present

## 2017-12-21 DIAGNOSIS — S8992XA Unspecified injury of left lower leg, initial encounter: Secondary | ICD-10-CM | POA: Diagnosis not present

## 2017-12-21 DIAGNOSIS — I69359 Hemiplegia and hemiparesis following cerebral infarction affecting unspecified side: Secondary | ICD-10-CM | POA: Diagnosis not present

## 2017-12-21 DIAGNOSIS — G4733 Obstructive sleep apnea (adult) (pediatric): Secondary | ICD-10-CM | POA: Diagnosis present

## 2017-12-21 DIAGNOSIS — M6281 Muscle weakness (generalized): Secondary | ICD-10-CM | POA: Diagnosis not present

## 2017-12-21 DIAGNOSIS — Z743 Need for continuous supervision: Secondary | ICD-10-CM | POA: Diagnosis not present

## 2017-12-21 DIAGNOSIS — R52 Pain, unspecified: Secondary | ICD-10-CM | POA: Diagnosis not present

## 2017-12-21 DIAGNOSIS — R609 Edema, unspecified: Secondary | ICD-10-CM | POA: Diagnosis not present

## 2017-12-21 DIAGNOSIS — M25552 Pain in left hip: Secondary | ICD-10-CM | POA: Diagnosis not present

## 2017-12-21 DIAGNOSIS — G459 Transient cerebral ischemic attack, unspecified: Secondary | ICD-10-CM | POA: Diagnosis not present

## 2017-12-21 DIAGNOSIS — R600 Localized edema: Secondary | ICD-10-CM | POA: Diagnosis not present

## 2017-12-21 DIAGNOSIS — R279 Unspecified lack of coordination: Secondary | ICD-10-CM | POA: Diagnosis not present

## 2017-12-21 DIAGNOSIS — I5030 Unspecified diastolic (congestive) heart failure: Secondary | ICD-10-CM | POA: Diagnosis present

## 2017-12-21 DIAGNOSIS — R0902 Hypoxemia: Secondary | ICD-10-CM | POA: Diagnosis not present

## 2017-12-21 DIAGNOSIS — R739 Hyperglycemia, unspecified: Secondary | ICD-10-CM | POA: Diagnosis present

## 2017-12-21 DIAGNOSIS — R2681 Unsteadiness on feet: Secondary | ICD-10-CM | POA: Diagnosis not present

## 2017-12-21 DIAGNOSIS — S0990XA Unspecified injury of head, initial encounter: Secondary | ICD-10-CM | POA: Diagnosis not present

## 2017-12-21 DIAGNOSIS — M25551 Pain in right hip: Secondary | ICD-10-CM | POA: Diagnosis not present

## 2017-12-21 DIAGNOSIS — S79912A Unspecified injury of left hip, initial encounter: Secondary | ICD-10-CM | POA: Diagnosis not present

## 2017-12-21 DIAGNOSIS — R41841 Cognitive communication deficit: Secondary | ICD-10-CM | POA: Diagnosis not present

## 2017-12-21 DIAGNOSIS — I10 Essential (primary) hypertension: Secondary | ICD-10-CM | POA: Diagnosis not present

## 2017-12-21 DIAGNOSIS — M25572 Pain in left ankle and joints of left foot: Secondary | ICD-10-CM | POA: Diagnosis not present

## 2017-12-21 LAB — COMPREHENSIVE METABOLIC PANEL
ALBUMIN: 3.6 g/dL (ref 3.5–5.0)
ALK PHOS: 50 U/L (ref 38–126)
ALT: 29 U/L (ref 0–44)
ANION GAP: 13 (ref 5–15)
AST: 48 U/L — ABNORMAL HIGH (ref 15–41)
BUN: 23 mg/dL (ref 8–23)
CALCIUM: 9.9 mg/dL (ref 8.9–10.3)
CO2: 22 mmol/L (ref 22–32)
Chloride: 105 mmol/L (ref 98–111)
Creatinine, Ser: 1.49 mg/dL — ABNORMAL HIGH (ref 0.44–1.00)
GFR calc non Af Amer: 33 mL/min — ABNORMAL LOW (ref >60)
GFR, EST AFRICAN AMERICAN: 38 mL/min — AB (ref >60)
Glucose, Bld: 130 mg/dL — ABNORMAL HIGH (ref 70–99)
POTASSIUM: 3.9 mmol/L (ref 3.5–5.1)
SODIUM: 140 mmol/L (ref 135–145)
Total Bilirubin: 0.6 mg/dL (ref 0.3–1.2)
Total Protein: 6.9 g/dL (ref 6.5–8.1)

## 2017-12-21 LAB — CBC WITH DIFFERENTIAL/PLATELET
Abs Immature Granulocytes: 0.56 10*3/uL — ABNORMAL HIGH (ref 0.00–0.07)
BASOS ABS: 0 10*3/uL (ref 0.0–0.1)
BASOS PCT: 0 %
EOS ABS: 0 10*3/uL (ref 0.0–0.5)
EOS PCT: 0 %
HCT: 31.8 % — ABNORMAL LOW (ref 36.0–46.0)
HEMOGLOBIN: 10.1 g/dL — AB (ref 12.0–15.0)
Immature Granulocytes: 3 %
Lymphocytes Relative: 2 %
Lymphs Abs: 0.3 10*3/uL — ABNORMAL LOW (ref 0.7–4.0)
MCH: 30.1 pg (ref 26.0–34.0)
MCHC: 31.8 g/dL (ref 30.0–36.0)
MCV: 94.9 fL (ref 80.0–100.0)
Monocytes Absolute: 0.7 10*3/uL (ref 0.1–1.0)
Monocytes Relative: 4 %
NRBC: 0 % (ref 0.0–0.2)
Neutro Abs: 17.9 10*3/uL — ABNORMAL HIGH (ref 1.7–7.7)
Neutrophils Relative %: 91 %
PLATELETS: 208 10*3/uL (ref 150–400)
RBC: 3.35 MIL/uL — AB (ref 3.87–5.11)
RDW: 13.8 % (ref 11.5–15.5)
WBC: 19.5 10*3/uL — AB (ref 4.0–10.5)

## 2017-12-21 LAB — I-STAT CG4 LACTIC ACID, ED: Lactic Acid, Venous: 2.72 mmol/L (ref 0.5–1.9)

## 2017-12-21 LAB — HEMOGLOBIN A1C
Hgb A1c MFr Bld: 5.5 % (ref 4.8–5.6)
Mean Plasma Glucose: 111.15 mg/dL

## 2017-12-21 LAB — LACTIC ACID, PLASMA: LACTIC ACID, VENOUS: 1.9 mmol/L (ref 0.5–1.9)

## 2017-12-21 MED ORDER — VANCOMYCIN HCL 500 MG IV SOLR
500.0000 mg | Freq: Once | INTRAVENOUS | Status: AC
  Administered 2017-12-22: 500 mg via INTRAVENOUS
  Filled 2017-12-21 (×2): qty 500

## 2017-12-21 MED ORDER — VANCOMYCIN HCL 500 MG IV SOLR
500.0000 mg | INTRAVENOUS | Status: DC
  Administered 2017-12-22 – 2017-12-24 (×3): 500 mg via INTRAVENOUS
  Filled 2017-12-21 (×4): qty 500

## 2017-12-21 MED ORDER — ONDANSETRON HCL 4 MG PO TABS: 4.0000 mg | ORAL_TABLET | Freq: Four times a day (QID) | ORAL | Status: DC | PRN

## 2017-12-21 MED ORDER — APIXABAN 5 MG PO TABS
5.0000 mg | ORAL_TABLET | Freq: Two times a day (BID) | ORAL | Status: DC
  Administered 2017-12-21 – 2017-12-25 (×8): 5 mg via ORAL
  Filled 2017-12-21 (×8): qty 1

## 2017-12-21 MED ORDER — ONDANSETRON HCL 4 MG/2ML IJ SOLN: 4.0000 mg | Freq: Four times a day (QID) | INTRAMUSCULAR | Status: DC | PRN

## 2017-12-21 MED ORDER — GABAPENTIN 300 MG PO CAPS
300.0000 mg | ORAL_CAPSULE | Freq: Two times a day (BID) | ORAL | Status: DC
  Administered 2017-12-21 – 2017-12-25 (×8): 300 mg via ORAL
  Filled 2017-12-21 (×8): qty 1

## 2017-12-21 MED ORDER — BUPROPION HCL ER (XL) 150 MG PO TB24
450.0000 mg | ORAL_TABLET | Freq: Every day | ORAL | Status: DC
  Administered 2017-12-22 – 2017-12-25 (×4): 450 mg via ORAL
  Filled 2017-12-21 (×4): qty 3

## 2017-12-21 MED ORDER — DONEPEZIL HCL 10 MG PO TABS
10.0000 mg | ORAL_TABLET | Freq: Every day | ORAL | Status: DC
  Administered 2017-12-21 – 2017-12-24 (×4): 10 mg via ORAL
  Filled 2017-12-21 (×4): qty 1

## 2017-12-21 MED ORDER — SERTRALINE HCL 25 MG PO TABS
12.5000 mg | ORAL_TABLET | Freq: Two times a day (BID) | ORAL | Status: DC
  Administered 2017-12-21 – 2017-12-25 (×8): 12.5 mg via ORAL
  Filled 2017-12-21 (×9): qty 0.5

## 2017-12-21 MED ORDER — PIPERACILLIN-TAZOBACTAM 3.375 G IVPB 30 MIN
3.3750 g | Freq: Once | INTRAVENOUS | Status: AC
  Administered 2017-12-21: 3.375 g via INTRAVENOUS
  Filled 2017-12-21: qty 50

## 2017-12-21 MED ORDER — HYDRALAZINE HCL 50 MG PO TABS
50.0000 mg | ORAL_TABLET | Freq: Two times a day (BID) | ORAL | Status: DC
  Administered 2017-12-21 – 2017-12-25 (×8): 50 mg via ORAL
  Filled 2017-12-21 (×8): qty 1

## 2017-12-21 MED ORDER — CARVEDILOL 12.5 MG PO TABS
12.5000 mg | ORAL_TABLET | Freq: Two times a day (BID) | ORAL | Status: DC
  Administered 2017-12-22 – 2017-12-25 (×6): 12.5 mg via ORAL
  Filled 2017-12-21 (×7): qty 1

## 2017-12-21 MED ORDER — ACETAMINOPHEN 500 MG PO TABS
1000.0000 mg | ORAL_TABLET | Freq: Four times a day (QID) | ORAL | Status: DC | PRN
  Administered 2017-12-21 – 2017-12-25 (×10): 1000 mg via ORAL
  Filled 2017-12-21 (×10): qty 2

## 2017-12-21 MED ORDER — PRAVASTATIN SODIUM 40 MG PO TABS
40.0000 mg | ORAL_TABLET | Freq: Every day | ORAL | Status: DC
  Administered 2017-12-21 – 2017-12-24 (×4): 40 mg via ORAL
  Filled 2017-12-21 (×4): qty 1

## 2017-12-21 MED ORDER — VANCOMYCIN HCL IN DEXTROSE 1-5 GM/200ML-% IV SOLN
1000.0000 mg | Freq: Once | INTRAVENOUS | Status: AC
  Administered 2017-12-21: 1000 mg via INTRAVENOUS
  Filled 2017-12-21: qty 200

## 2017-12-21 MED ORDER — SODIUM CHLORIDE 0.9 % IV BOLUS
1000.0000 mL | Freq: Once | INTRAVENOUS | Status: AC
  Administered 2017-12-21: 1000 mL via INTRAVENOUS

## 2017-12-21 MED ORDER — SODIUM CHLORIDE 0.9 % IV BOLUS
500.0000 mL | Freq: Once | INTRAVENOUS | Status: AC
  Administered 2017-12-21: 500 mL via INTRAVENOUS

## 2017-12-21 NOTE — H&P (Addendum)
History and Physical    Susan [name] ZOX:[id number] DOB: [birth year] DOA: 12/21/2017  Referring MD/NP/PA: EDP PCP:  Patient coming from: [facility]  Chief Complaint: Fall, not acting right  HPI: Susan [name] is a 77 y.o. 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  She was also found to be more confused than baseline, unable to ambulate when EMS arrived, subsequently brought to the emergency room ED Course: Found to be febrile, had redness swelling and pain in her left lower leg, Dopplers negative for DVT, she was given IV fluid bolus and antibiotics for presumed cellulitis -CT head noted atrophy and evidence of prior stroke, no acute abnormalities, WBC count was significantly elevated at 19 K  Review of Systems: Limited due to dementia  Past Medical History:  Diagnosis Date  . Anxiety   . Arthritis    "legs, back" (07/29/2012)  . Chronic diastolic (congestive) heart failure ([place])   . Chronic lower back pain   . Complication of anesthesia    "I have apnea" (07/29/2012)  . Dementia ([place])    per family  . Depression   . 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 Edema 02/09/2015  . Family history of anesthesia complication    "some wake up during OR; some are hard to wake up; some both" (07/29/2012)  . Gait abnormality 06/18/2016  . GERD (gastroesophageal reflux disease)   . 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 ([place])   . History of CVA (cerebrovascular accident) 02/01/2015  . History of stomach ulcers 1980's  . Hyperlipemia 06/18/2016  . Hyperlipidemia   . Hyperparathyroidism, primary ([place]) 04/22/2012  . Hypertensive heart disease   . Incontinent of urine    wears pads  . Major depressive disorder, recurrent episode, severe ([place]) 11/21/2015  . Medication management 10/11/2016  . OSA (obstructive sleep apnea)   . OSA on CPAP   . Osteoarthritis of right knee   . Pedal edema   . Persistent atrial fibrillation    CHADS2VASC score is 7 - on chronic anticoagulation with Apixaban  . Primary hyperparathyroidism ([place])   . Short-term memory loss 01/28/2015  . Spinal stenosis   . Stroke ([place])   . Thyroid disease   . TIA (transient ischemic attack) 01/24/2015  . Umbilical hernia    "unrepaired" (07/29/2012)  . Varicose veins    "BLE" (07/29/2012)    Past Surgical History:  Procedure Laterality Date  . CATARACT EXTRACTION    . CHOLECYSTECTOMY  ~ 2010  . COLONOSCOPY    . IUD REMOVAL  1980's  . PARATHYROIDECTOMY N/A 06/16/2012   Procedure: NECK EXPLORATION AND LEFT SUPERIOR PARATHYROIDECTOMY;  Surgeon: Earnstine Regal, MD;  Location: [facility];  Service: General;  Laterality: N/A;  . 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 TOTAL KNEE ARTHROPLASTY Right 07/29/2012   Procedure: TOTAL KNEE ARTHROPLASTY;  Surgeon: Ninetta Lights, MD;  Location: [facility];  Service: Orthopedics;  Laterality: Right;     reports that she quit smoking about 39 years ago. Her smoking use included cigarettes. She has a 30.00 pack-year smoking history. She has never used smokeless tobacco. She reports that she does not drink alcohol or use drugs.  Allergies  Allergen Reactions  . Ace Inhibitors Cough  .  Lipitor [Atorvastatin] Swelling  . Simvastatin Other (See Comments)    Myalgias     . Latex Itching    Family History  Problem Relation Age of Onset  . Hypertension Mother        deceased  . Stroke Mother   . Breast cancer Mother   . Lung cancer Father        deceased  . Diabetes Daughter   . Hypertension Daughter      Prior to Admission medications   Medication Sig Start Date End Date Taking? Authorizing Provider  acetaminophen (TYLENOL) 500 MG tablet Take 2  tablets (1,000 mg total) by mouth every 6 (six) hours as needed for moderate pain. Take 1000 mg tablet twice daily in the morning and evening and take 1 tablet ( 500 mg) by mouth daily at 2 pm. 09/25/17  Yes Ngetich, Dinah C, NP  apixaban (ELIQUIS) 5 MG TABS tablet Take 1 tablet (5 mg total) by mouth every 12 (twelve) hours. 01/26/15  Yes Rai, Ripudeep K, MD  buPROPion (WELLBUTRIN XL) 150 MG 24 hr tablet Take 450 mg by mouth daily.   Yes [provider]  CALCIUM PO Take 1 tablet by mouth daily.   Yes [provider]  carvedilol (COREG) 12.5 MG tablet Take 12.5 mg by mouth 2 (two) times daily with a meal.   Yes [provider]  cholecalciferol (VITAMIN D) 1000 units tablet Take 1,000 Units by mouth daily.    Yes [provider]  diclofenac sodium (VOLTAREN) 1 % GEL Apply 2 g topically 4 (four) times daily. 10/07/17  Yes Leandrew Koyanagi, MD  docusate sodium (COLACE) 100 MG capsule Take 100 mg by mouth daily.   Yes [provider]  donepezil (ARICEPT) 10 MG tablet Take 1 tablet (10 mg total) by mouth at bedtime. For memory loss 11/17/17  Yes Eubanks, Carlos American, NP  furosemide (LASIX) 20 MG tablet Take 20 mg by mouth daily.   Yes [provider]  gabapentin (NEURONTIN) 300 MG capsule Take 300 mg by mouth 2 (two) times daily.    Yes [provider]  hydrALAZINE (APRESOLINE) 50 MG tablet Take 1 tablet (50 mg total) by mouth 2 (two) times daily. 07/03/16  Yes Turner, Eber Hong, MD  iron polysaccharides (IFEREX 150) 150 MG capsule Take 150 mg by mouth daily.   Yes [provider]  losartan (COZAAR) 50 MG tablet Take 1 tablet (50 mg total) by mouth daily. 08/22/16  Yes Turner, Eber Hong, MD  Menthol, Topical Analgesic, (BIOFREEZE) 4 % GEL Apply 3 oz topically 3 (three) times daily. For knee pain 09/25/17  Yes Ngetich, Dinah C, NP  Multiple Vitamin (MULTIVITAMIN WITH MINERALS) TABS Take 1 tablet by mouth daily.   Yes [provider]    pravastatin (PRAVACHOL) 40 MG tablet Take 40 mg by mouth at bedtime.  11/01/15  Yes [provider]  sertraline (ZOLOFT) 25 MG tablet Take 0.5 tablets (12.5 mg total) by mouth 2 (two) times daily. 08/15/17  Yes Gildardo Cranker, DO  vitamin B-12 (CYANOCOBALAMIN) 1000 MCG tablet Take 2,000 mcg by mouth daily.   Yes [provider]    Physical Exam: Vitals:   12/21/17 1715 12/21/17 1730 12/21/17 1745 12/21/17 1800  BP: (!) 169/71 (!) 166/69 (!) 165/91 (!) 172/72  Pulse: 80 81 79 79  Resp: (!) 31 (!) 22 16 (!) 29  Temp:      TempSrc:      SpO2: 96% 96% 97% 99%  Weight:      Height:          Constitutional: Elderly frail female, laying in bed, alert awake oriented to self only, pleasantly confused, dysarthric Vitals:   12/21/17 1715 12/21/17 1730 12/21/17 1745 12/21/17 1800  BP: (!) 169/71 (!) 166/69 (!) 165/91 (!) 172/72  Pulse: 80 81 79 79  Resp: (!) 31 (!) 22 16 (!) 29  Temp:      TempSrc:      SpO2: 96% 96% 97% 99%  Weight:      Height:       Eyes: PERRL, lids and conjunctivae normal ENMT: Mucous membranes are moist.  Neck: normal, supple Respiratory: Decreased breath sounds at both bases, rest is clear Cardiovascular: Regular rate and rhythm, no murmurs / rubs / gallops Abdomen: soft, non tender, Bowel sounds positive.  Musculoskeletal: Pain with movement of both hip joints as well as knee joints Ext: Left lower leg with redness swelling warmth and tenderness involving most of her lower leg Skin: As above Neurologic: Mild dysarthria and mild left hemiplegia Psychiatric: Unable to assess  Labs on Admission: I have personally reviewed following labs and imaging studies  CBC: Recent Labs  Lab 12/17/17 1007 12/21/17 1527  WBC 2.9* 19.5*  NEUTROABS 1,601 17.9*  HGB 9.5* 10.1*  HCT 30.0* 31.8*  MCV 93.5 94.9  PLT 235 893   Basic Metabolic Panel: Recent Labs  Lab 12/17/17 1007 12/21/17 1527  NA 142 140  K 4.0 3.9  CL 110 105  CO2 26 22   GLUCOSE 118 130*  BUN 19 23  CREATININE 1.51* 1.49*  CALCIUM 9.8 9.9   GFR: Estimated Creatinine Clearance: 31.1 mL/min (A) (by C-G formula based on SCr of 1.49 mg/dL (H)). Liver Function Tests: Recent Labs  Lab 12/17/17 1007 12/21/17 1527  AST 31 48*  ALT 21 29  ALKPHOS  --  50  BILITOT 0.3 0.6  PROT 6.3 6.9  ALBUMIN  --  3.6   No results for input(s): LIPASE, AMYLASE in the last 168 hours. No results for input(s): AMMONIA in the last 168 hours. Coagulation Profile: No results for input(s): INR, PROTIME in the last 168 hours. Cardiac Enzymes: No results for input(s): CKTOTAL, CKMB, CKMBINDEX, TROPONINI in the last 168 hours. BNP (last 3 results) No results for input(s): PROBNP in the last 8760 hours. HbA1C: No results for input(s): HGBA1C in the last 72 hours. CBG: No results for input(s): GLUCAP in the last 168 hours. Lipid Profile: No results for input(s): CHOL, HDL, LDLCALC, TRIG, CHOLHDL, LDLDIRECT in the last 72 hours. Thyroid Function Tests: No results for input(s): TSH, T4TOTAL, FREET4, T3FREE, THYROIDAB in the last 72 hours. Anemia Panel: No results for input(s): VITAMINB12, FOLATE, FERRITIN, TIBC, IRON, RETICCTPCT in the last 72 hours. Urine analysis:    Component Value Date/Time   COLORURINE YELLOW 09/05/2017 0803   APPEARANCEUR CLEAR 09/05/2017 0803   LABSPEC 1.013 09/05/2017 0803   PHURINE < OR = 5.0 09/05/2017 0803   GLUCOSEU NEGATIVE 09/05/2017 0803   HGBUR NEGATIVE 09/05/2017 0803   BILIRUBINUR NEGATIVE 11/21/2015 1051   KETONESUR NEGATIVE 09/05/2017 0803   PROTEINUR NEGATIVE 09/05/2017 0803   UROBILINOGEN 0.2 03/05/2013 1259   NITRITE NEGATIVE 09/05/2017 0803   LEUKOCYTESUR TRACE (A) 09/05/2017 0803   Sepsis Labs: @LABRCNTIP (procalcitonin:4,lacticidven:4) )No results found for this or any previous visit (from the past 240 hour(s)).   Radiological Exams on Admission: Dg Chest 2 View  Result Date: 12/21/2017 CLINICAL DATA:  [place] at home,  UTI EXAM: CHEST - 2 VIEW COMPARISON:  11/21/2015 FINDINGS: Enlargement of cardiac silhouette with pulmonary vascular congestion. Atherosclerotic calcification aorta. Interstitial prominence in both lungs increased since previous exam question minimal failure. No segmental consolidation, pleural effusion or pneumothorax. Bones diffusely demineralized. IMPRESSION: Enlargement of cardiac silhouette with pulmonary vascular congestion and question minimal failure. Electronically Signed   By: Lavonia Dana M.D.   On: 12/21/2017 16:52   Ct Head Wo Contrast  Result Date: 12/21/2017 CLINICAL DATA:  Unwitnessed fall EXAM: CT HEAD WITHOUT CONTRAST CT CERVICAL SPINE WITHOUT CONTRAST TECHNIQUE: Multidetector CT imaging of the head and cervical spine was performed following the standard protocol without intravenous contrast. Multiplanar CT image reconstructions of the cervical spine were also generated. COMPARISON:  Head CT 01/25/2015 FINDINGS: CT HEAD FINDINGS Brain: There is no mass, hemorrhage or extra-axial collection. There is generalized atrophy without lobar predilection. There is hypoattenuation of the periventricular white matter, most commonly indicating chronic ischemic microangiopathy. Vascular: No abnormal hyperdensity of the major intracranial arteries or dural venous sinuses. No intracranial atherosclerosis. Skull: The visualized skull base, calvarium and extracranial soft tissues are normal. Sinuses/Orbits: Moderate paranasal sinus opacification. No fluid levels. The orbits are normal. CT CERVICAL SPINE FINDINGS Alignment: Reversal of normal cervical lordosis. Grade 1 C2-3 anterolisthesis. Facet alignment is normal. Occipital condyles are aligned with the lateral masses of C1 and C2. Skull base and vertebrae: No acute fracture. Soft tissues and spinal canal: No prevertebral fluid or swelling. No visible canal hematoma. Disc levels: There is multilevel severe disc space narrowing, predominantly in the upper  cervical spine. Uncovertebral hypertrophy contributes to foraminal stenosis greatest at right C3-4, C4-5 and C5-6. No bony spinal canal stenosis. Upper chest: No pneumothorax, pulmonary nodule or pleural effusion. Other: Normal visualized paraspinal cervical soft tissues. IMPRESSION: 1. Atrophy and chronic ischemic microangiopathy without acute intracranial abnormality. 2. No acute fracture of the cervical spine. Electronically Signed   By: Ulyses Jarred M.D.   On: 12/21/2017 17:16   Ct Cervical Spine Wo Contrast  Result Date: 12/21/2017 CLINICAL DATA:  Unwitnessed fall EXAM: CT HEAD WITHOUT CONTRAST CT CERVICAL SPINE WITHOUT CONTRAST TECHNIQUE: Multidetector CT imaging of the head and cervical spine was performed following the standard protocol without intravenous contrast. Multiplanar CT image reconstructions of the cervical spine were also generated. COMPARISON:  Head CT 01/25/2015 FINDINGS: CT HEAD FINDINGS Brain: There is no mass, hemorrhage or extra-axial collection. There is generalized atrophy without lobar predilection. There is hypoattenuation of the periventricular white matter, most commonly indicating chronic ischemic microangiopathy. Vascular: No abnormal hyperdensity of the major intracranial arteries or dural venous sinuses. No intracranial atherosclerosis. Skull: The visualized skull base, calvarium and extracranial soft tissues are normal. Sinuses/Orbits: Moderate paranasal sinus opacification. No fluid levels. The orbits are normal. CT CERVICAL SPINE FINDINGS Alignment: Reversal of normal cervical lordosis. Grade 1 C2-3 anterolisthesis. Facet alignment is normal. Occipital condyles are aligned with the lateral masses of C1 and C2. Skull base and vertebrae: No acute fracture. Soft tissues and spinal canal: No prevertebral fluid or swelling. No visible canal hematoma. Disc levels: There is multilevel severe disc space narrowing, predominantly in the upper cervical spine. Uncovertebral  hypertrophy contributes to foraminal stenosis greatest at right C3-4, C4-5 and C5-6. No bony spinal canal stenosis. Upper chest: No pneumothorax, pulmonary nodule or pleural effusion. Other: Normal visualized paraspinal cervical soft tissues. IMPRESSION: 1. Atrophy and chronic ischemic microangiopathy without acute intracranial abnormality. 2. No acute fracture of the cervical spine. Electronically Signed   By: Lennette Bihari  [place] M.D.   On: 12/21/2017 17:16   Dg Knee Complete 4 Views Left  Result Date: 12/21/2017 CLINICAL DATA:  Unwitnessed fall at home, generalized LEFT knee pain EXAM: LEFT KNEE - COMPLETE 4+ VIEW COMPARISON:  09/26/2017 FINDINGS: Osseous demineralization. Diffuse joint space narrowing and spur formation greatest at lateral compartment. Subchondral sclerosis at lateral compartment. No acute fracture, dislocation, or bone destruction. No knee joint effusion. Small patellar spur at quadriceps tendon insertion. IMPRESSION: Osseous demineralization with degenerative changes LEFT knee. No acute abnormalities. Electronically Signed   By: Lavonia Dana M.D.   On: 12/21/2017 16:51   Vas [place] Lower Extremity Venous (dvt) (only [facility])  Result Date: 12/21/2017  Lower Venous Study Indications: Swelling, and Edema.  Performing Technologist: Abram Sander RVS  Examination Guidelines: A complete evaluation includes B-mode imaging, spectral Doppler, color Doppler, and power Doppler as needed of all accessible portions of each vessel. Bilateral testing is considered an integral part of a complete examination. Limited examinations for reoccurring indications may be performed as noted.  Right Venous Findings: +---+---------------+---------+-----------+----------+-------+    CompressibilityPhasicitySpontaneityPropertiesSummary +---+---------------+---------+-----------+----------+-------+ CFVFull           Yes      Yes                           +---+---------------+---------+-----------+----------+-------+  Left Venous Findings: +---------+---------------+---------+-----------+----------+--------------+          CompressibilityPhasicitySpontaneityPropertiesSummary        +---------+---------------+---------+-----------+----------+--------------+ CFV      Full           Yes      Yes                                 +---------+---------------+---------+-----------+----------+--------------+ SFJ      Full                                                        +---------+---------------+---------+-----------+----------+--------------+ FV Prox  Full                                                        +---------+---------------+---------+-----------+----------+--------------+ FV Mid   Full                                                        +---------+---------------+---------+-----------+----------+--------------+ FV DistalFull                                                        +---------+---------------+---------+-----------+----------+--------------+ PFV      Full                                                        +---------+---------------+---------+-----------+----------+--------------+  POP      Full           Yes      Yes                                 +---------+---------------+---------+-----------+----------+--------------+ PTV      Full                                                        +---------+---------------+---------+-----------+----------+--------------+ PERO                                                  Not visualized +---------+---------------+---------+-----------+----------+--------------+    Summary: Right: No evidence of common femoral vein obstruction. Left: There is no evidence of deep vein thrombosis in the lower extremity. No cystic structure found in the popliteal fossa.  *See table(s) above for measurements and observations. Electronically  signed by Deitra Mayo MD on 12/21/2017 at 5:51:56 PM.    Final      Assessment/Plan Principal Problem:    Sepsis ([place]) -due to cellulitis most likely -Urinalysis is pending, although no symptoms of UTI, chest x-ray is unremarkable -Start IV vancomycin, has been adequately fluid resuscitated and blood pressure is high we will hold off on additional IV fluids at this time -Follow-up blood and urine cultures  Metabolic encephalopathy -Due to above sepsis, has mild dementia at baseline however able to live independently  Hip pain - with movement on exam -given fall, check X-rays    Chronic diastolic (congestive) heart failure ([place]) -Appears euvolemic at this time, has been adequately fluid resuscitated -Hold Lasix today, I will not add additional IV fluids at this time    Persistent atrial fibrillation -In sinus rhythm now, continue apixaban and Coreg  Hypertension -Hold Lasix and losartan, continue hydralazine and Coreg   History of CVA (cerebrovascular accident) -With mild residual left hemiplegia, mild dysarthria -CT head without acute findings, -Continue apixaban  Mild hyperglycemia -Hemoglobin A1c  CKD 3 -Stable at baseline  DVT prophylaxis: On apixaban Code Status: Full code Family Communication: 2 daughters at bedside Disposition Plan: May need higher level of care Consults called: None Admission status: Inpatient  Domenic Polite MD [facility] Pager [phone]  If 7PM-7AM, please contact night-coverage [email or web address] Password [facility]  12/21/2017, 6:17 PM

## 2017-12-21 NOTE — Progress Notes (Signed)
Pharmacy Antibiotic Note  Susan [name] is a 77 y.o. female admitted on 12/21/2017 with cellulitis.  Pharmacy has been consulted for Vancomycin dosing.  Plan: Vancomycin 500 mg iv x 1 to make loading dose of 1500 mg Vancomycin 500 mg iv Q 24 starting 12/6 Follow up progress, Scr, LOT  Height: 5\' 2"  (157.5 cm) Weight: 188 lb 11.4 oz (85.6 kg) IBW/kg (Calculated) : 50.1  Temp (24hrs), Avg:101.4 F (38.6 C), Min:101.4 F (38.6 C), Max:101.4 F (38.6 C)  Recent Labs  Lab 12/17/17 1007 12/21/17 1527 12/21/17 1544  WBC 2.9* 19.5*  --   CREATININE 1.51* 1.49*  --   LATICACIDVEN  --   --  2.72*    Estimated Creatinine Clearance: 31.1 mL/min (A) (by C-G formula based on SCr of 1.49 mg/dL (H)).    Allergies  Allergen Reactions  . Ace Inhibitors Cough  . Lipitor [Atorvastatin] Swelling  . Simvastatin Other (See Comments)    Myalgias     . Latex Itching     Thank you for allowing pharmacy to be a part of this patient's care.  Tad Moore 12/21/2017 6:24 PM

## 2017-12-21 NOTE — ED Notes (Signed)
Patient transported to [facility]

## 2017-12-21 NOTE — ED Provider Notes (Signed)
[facility] EMERGENCY DEPARTMENT Provider Note   CSN: [id number] Arrival date & time: 12/21/17  1505     History   Chief Complaint Chief Complaint  Patient presents with  . Fall    HPI Susan [name] is a 77 y.o. female.  HPI Patient brought in by EMS.  Unwitnessed fall at home.  Found by family members lying supine.  Patient has history of previous stroke and expressive aphasia.  Also has baseline dementia per family members.  Difficult historian.  Level 5 caveat applies.  Per EMS patient had low-grade fever and tachypnea.  Patient also is complaining of neck pain but family states this is chronic. Past Medical History:  Diagnosis Date  . Anxiety   . Arthritis    "legs, back" (07/29/2012)  . Chronic diastolic (congestive) heart failure ([place])   . Chronic lower back pain   . Complication of anesthesia    "I have apnea" (07/29/2012)  . Dementia ([place])    per family  . Depression   . 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 Edema 02/09/2015  . Family history of anesthesia complication    "some wake up during OR; some are hard to wake up; some both" (07/29/2012)  . Gait abnormality 06/18/2016  . GERD (gastroesophageal reflux disease)   . 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 ([place])   . History of CVA (cerebrovascular accident) 02/01/2015  . History of stomach ulcers 1980's  . Hyperlipemia 06/18/2016  . Hyperlipidemia   . Hyperparathyroidism, primary ([place]) 04/22/2012  . Hypertensive heart disease   . Incontinent of urine    wears pads  . Major depressive disorder, recurrent episode, severe ([place]) 11/21/2015  . Medication management 10/11/2016  . OSA (obstructive sleep apnea)   . OSA on CPAP   . Osteoarthritis of right knee   . Pedal edema   . Persistent atrial fibrillation    CHADS2VASC score is 7 - on chronic anticoagulation with Apixaban  . Primary hyperparathyroidism ([place])   . Short-term memory loss 01/28/2015  . Spinal  stenosis   . Stroke ([place])   . Thyroid disease   . TIA (transient ischemic attack) 01/24/2015  . Umbilical hernia    "unrepaired" (07/29/2012)  . Varicose veins    "BLE" (07/29/2012)    Patient Active Problem List   Diagnosis Date Noted  . Ankle edema   . Sepsis ([place]) 12/21/2017  . Medication management 10/11/2016  . Hyperlipemia 06/18/2016  . Gait abnormality 06/18/2016  . Major depressive disorder, recurrent episode, severe ([place]) 11/21/2015  . Edema 02/09/2015  . History of CVA (cerebrovascular accident) 02/01/2015  . 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 ([place])   . 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 Chronic low back pain 01/28/2015  . Short-term memory loss 01/28/2015  . Volume depletion 01/28/2015  . Persistent atrial fibrillation 01/26/2015  . DCM (dilated cardiomyopathy) ([place]) 01/26/2015  . TIA (transient ischemic attack) 01/24/2015  . Chronic diastolic (congestive) heart failure ([place])   . Obesity (BMI 30-39.9) 01/27/2013  . Constipation 09/02/2012  . H/O arthroscopy of right knee 08/06/2012  . Anxiety   . Depression   . GERD (gastroesophageal reflux disease)   . Hypertensive heart disease   . OSA (obstructive sleep apnea)   . Osteoarthritis of right knee   . Hyperparathyroidism, primary ([place]) 04/22/2012  . Chronic cough 10/02/2011    Past Surgical History:  Procedure Laterality Date  . CATARACT EXTRACTION    .  CHOLECYSTECTOMY  ~ 2010  . COLONOSCOPY    . IUD REMOVAL  1980's  . PARATHYROIDECTOMY N/A 06/16/2012   Procedure: NECK EXPLORATION AND LEFT SUPERIOR PARATHYROIDECTOMY;  Surgeon: Earnstine Regal, MD;  Location: [facility];  Service: General;  Laterality: N/A;  . 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 TOTAL KNEE ARTHROPLASTY Right 07/29/2012   Procedure: TOTAL KNEE ARTHROPLASTY;  Surgeon: Ninetta Lights, MD;  Location: [facility];  Service: Orthopedics;  Laterality: Right;     OB History    Gravida  3   Para  1   Term        Preterm      AB  0   Living  3     SAB      TAB      Ectopic      Multiple  0   Live Births               Home Medications    Prior to Admission medications   Medication Sig Start Date End Date Taking? Authorizing Provider  acetaminophen (TYLENOL) 500 MG tablet Take 2 tablets (1,000 mg total) by mouth every 6 (six) hours as needed for moderate pain. Take 1000 mg tablet twice daily in the morning and evening and take 1 tablet ( 500 mg) by mouth daily at 2 pm. Patient taking differently: Take 1,000 mg by mouth 2 (two) times daily. Take 1000 mg tablet twice daily in the morning and evening and take 1 tablet ( 500 mg) by mouth daily at 2 pm. 09/25/17  Yes Ngetich, Dinah C, NP  apixaban (ELIQUIS) 5 MG TABS tablet Take 1 tablet (5 mg total) by mouth every 12 (twelve) hours. 01/26/15  Yes Rai, Ripudeep K, MD  buPROPion (WELLBUTRIN XL) 150 MG 24 hr tablet Take 450 mg by mouth daily.   Yes [provider]  carvedilol (COREG) 12.5 MG tablet Take 12.5 mg by mouth 2 (two) times daily with a meal.   Yes [provider]  cholecalciferol (VITAMIN D) 1000 units tablet Take 1,000 Units by mouth daily.    Yes [provider]  diclofenac sodium (VOLTAREN) 1 % GEL Apply 2 g topically 4 (four) times daily. 10/07/17  Yes Leandrew Koyanagi, MD  docusate sodium (COLACE) 100 MG capsule Take 100 mg by mouth daily.   Yes [provider]  donepezil (ARICEPT) 10 MG tablet Take 1 tablet (10 mg total) by mouth at bedtime. For memory loss 11/17/17  Yes Eubanks, Carlos American, NP  furosemide (LASIX) 20 MG tablet Take 20 mg by mouth daily.   Yes [provider]  gabapentin (NEURONTIN) 300 MG capsule Take 300 mg by mouth 2 (two) times daily.    Yes [provider]  iron polysaccharides (IFEREX 150) 150 MG capsule Take 150 mg by mouth daily.   Yes [provider]  losartan (COZAAR) 50 MG tablet Take 1 tablet (50 mg total) by mouth daily. 08/22/16  Yes  Turner, Eber Hong, MD  Menthol, Topical Analgesic, (BIOFREEZE) 4 % GEL Apply 3 oz topically 3 (three) times daily. For knee pain 09/25/17  Yes Ngetich, Dinah C, NP  Multiple Vitamin (MULTIVITAMIN WITH MINERALS) TABS Take 1 tablet by mouth daily.   Yes [provider]  pantoprazole (PROTONIX) 40 MG tablet Take 40 mg by mouth daily.   Yes [provider]  pravastatin (PRAVACHOL) 40 MG tablet Take 40 mg by mouth every evening.  11/01/15  Yes [provider]  sertraline (ZOLOFT) 25 MG tablet Take 0.5 tablets (12.5 mg total) by mouth 2 (two) times daily. 08/15/17  Yes Gildardo Cranker, DO  vitamin B-12 (CYANOCOBALAMIN) 1000 MCG tablet Take 2,000 mcg by mouth daily.   Yes [provider]  amoxicillin-clavulanate (AUGMENTIN) 875-125 MG tablet Take 1 tablet by mouth 2 (two) times daily for 7 days. 12/25/17 01/01/18  Georgette Shell, MD  Calcium Carbonate-Vitamin D 600-400 MG-UNIT tablet Take 1 tablet by mouth 2 (two) times daily. Patient not taking: Reported on 12/22/2017 12/22/17   Lauree Chandler, NP  saccharomyces boulardii (FLORASTOR) 250 MG capsule Take 1 capsule (250 mg total) by mouth 2 (two) times daily. 12/25/17   Georgette Shell, MD  traMADol (ULTRAM) 50 MG tablet Take 1 tablet (50 mg total) by mouth every 6 (six) hours as needed. 12/25/17 12/25/18  Georgette Shell, MD    Family History Family History  Problem Relation Age of Onset  . Hypertension Mother        deceased  . Stroke Mother   . Breast cancer Mother   . Lung cancer Father        deceased  . Diabetes Daughter   . Hypertension Daughter     Social History Social History   Tobacco Use  . Smoking status: Former Smoker    Packs/day: 1.00    Years: 30.00    Pack years: 30.00    Types: Cigarettes    Last attempt to quit: 01/07/1978    Years since quitting: 39.9  . Smokeless tobacco: Never Used  Substance Use Topics  . Alcohol use: No  . Drug use: No     Allergies   Ace  inhibitors; Lipitor [atorvastatin]; Simvastatin; and Latex   Review of Systems Review of Systems  Unable to perform ROS: Dementia     Physical Exam Updated Vital Signs BP (!) 145/80 (BP Location: Right Arm)   Pulse 64   Temp 98.6 F (37 C) (Oral)   Resp 20   Ht 5\' 2"  (1.575 m)   Wt 85.6 kg   SpO2 99%   BMI 34.52 kg/m   Physical Exam Vitals signs and nursing note reviewed.  Constitutional:      General: She is not in acute distress.    Appearance: Normal appearance. She is well-developed. She is not ill-appearing.  HENT:     Head: Normocephalic and atraumatic.     Comments: No evidence of head trauma.  No intraoral trauma.    Nose: Nose normal.     Mouth/Throat:     Mouth: Mucous membranes are moist.  Eyes:     Extraocular Movements: Extraocular movements intact.     Conjunctiva/sclera: Conjunctivae normal.     Pupils: Pupils are equal, round, and reactive to light.  Neck:     Comments: Diffuse posterior midline cervical tenderness to palpation.  Cardiovascular:     Rate and Rhythm: Normal rate and regular rhythm.  Pulmonary:     Effort: Pulmonary effort is normal. No respiratory distress.     Breath sounds: Normal breath sounds. No stridor. No wheezing, rhonchi or rales.  Chest:     Chest wall: No tenderness.  Abdominal:     General: Bowel sounds are normal.     Palpations: Abdomen is soft.     Tenderness: There is no abdominal tenderness. There is no guarding or rebound.     Hernia: A hernia is present.     Comments: Periumbilical hernia.  Soft.  Musculoskeletal: Normal range  of motion.        General: No tenderness.     Comments: Decreased range of motion of the left knee due to pain.  No obvious tenderness to palpation over bilateral hips.  Distal pulses are 2+.  Skin:    General: Skin is warm and dry.     Findings: Erythema present. No rash.     Comments: Left lower extremity edema from the ankle to the midcalf.  Warm and tender to palpation.  No evidence  of trauma.  Neurological:     Mental Status: She is alert.     Comments: Oriented to person.  Dysarthria.  Difficult to assess motor of left lower extremity due to pain.  Appears to be moving all other extremities without deficit.  Psychiatric:        Behavior: Behavior normal.      ED Treatments / Results  Labs (all labs ordered are listed, but only abnormal results are displayed) Labs Reviewed  CBC WITH DIFFERENTIAL/PLATELET - Abnormal; Notable for the following components:      Result Value   WBC 19.5 (*)    RBC 3.35 (*)    Hemoglobin 10.1 (*)    HCT 31.8 (*)    Neutro Abs 17.9 (*)    Lymphs Abs 0.3 (*)    Abs Immature Granulocytes 0.56 (*)    All other components within normal limits  COMPREHENSIVE METABOLIC PANEL - Abnormal; Notable for the following components:   Glucose, Bld 130 (*)    Creatinine, Ser 1.49 (*)    AST 48 (*)    GFR calc non Af Amer 33 (*)    GFR calc Af Amer 38 (*)    All other components within normal limits  URINALYSIS, ROUTINE W REFLEX MICROSCOPIC - Abnormal; Notable for the following components:   Hgb urine dipstick MODERATE (*)    All other components within normal limits  CBC - Abnormal; Notable for the following components:   WBC 16.5 (*)    RBC 3.22 (*)    Hemoglobin 9.5 (*)    HCT 30.5 (*)    All other components within normal limits  BASIC METABOLIC PANEL - Abnormal; Notable for the following components:   CO2 21 (*)    Glucose, Bld 117 (*)    Creatinine, Ser 1.49 (*)    GFR calc non Af Amer 33 (*)    GFR calc Af Amer 38 (*)    All other components within normal limits  CBC - Abnormal; Notable for the following components:   WBC 13.8 (*)    RBC 3.09 (*)    Hemoglobin 9.5 (*)    HCT 29.5 (*)    All other components within normal limits  BASIC METABOLIC PANEL - Abnormal; Notable for the following components:   CO2 19 (*)    Glucose, Bld 103 (*)    BUN 24 (*)    Creatinine, Ser 1.66 (*)    GFR calc non Af Amer 29 (*)    GFR calc  Af Amer 34 (*)    All other components within normal limits  BASIC METABOLIC PANEL - Abnormal; Notable for the following components:   Potassium 3.2 (*)    CO2 20 (*)    Glucose, Bld 108 (*)    Creatinine, Ser 1.33 (*)    GFR calc non Af Amer 38 (*)    GFR calc Af Amer 44 (*)    All other components within normal limits  CBC - Abnormal;  Notable for the following components:   RBC 2.93 (*)    Hemoglobin 8.9 (*)    HCT 27.4 (*)    All other components within normal limits  CBC - Abnormal; Notable for the following components:   RBC 2.74 (*)    Hemoglobin 8.3 (*)    HCT 25.4 (*)    All other components within normal limits  BASIC METABOLIC PANEL - Abnormal; Notable for the following components:   Potassium 3.2 (*)    Glucose, Bld 119 (*)    Creatinine, Ser 1.11 (*)    GFR calc non Af Amer 47 (*)    GFR calc Af Amer 55 (*)    All other components within normal limits  I-STAT CG4 LACTIC ACID, ED - Abnormal; Notable for the following components:   Lactic Acid, Venous 2.72 (*)    All other components within normal limits  CULTURE, BLOOD (ROUTINE X 2)  CULTURE, BLOOD (ROUTINE X 2)  URINE CULTURE  LACTIC ACID, PLASMA  HEMOGLOBIN A1C    EKG EKG Interpretation  Date/Time:  Sunday December 21 2017 15:23:44 EST Ventricular Rate:  80 PR Interval:    QRS Duration: 130 QT Interval:  393 QTC Calculation: 454 R Axis:   -64 Text Interpretation:  Normal sinus rhythm Left ventricular hypertrophy No significant change since last tracing Confirmed by Pattricia Boss [phone]) on 12/22/2017 6:58:47 PM   Radiology Ct Ankle Left Wo Contrast  Result Date: 12/25/2017 CLINICAL DATA:  Left lower extremity pain and swelling since a fall several days ago. Initial encounter. EXAM: CT OF THE LOWER LEFT EXTREMITY WITHOUT CONTRAST TECHNIQUE: Multidetector CT imaging of the lower left extremity was performed according to the standard protocol. COMPARISON:  Plain films left ankle 12/22/2017. FINDINGS:  Bones/Joint/Cartilage No acute bony or joint abnormality is seen. No focal bony lesion is identified. Bones appear osteopenic. There is degenerative disease about the knee with joint space narrowing and osteophytosis appearing worst on the medial side. 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 Ligaments Suboptimally assessed by CT. Major ligamentous structures appear intact. Muscles and Tendons No tear is identified. No intramuscular fluid collection is seen. Scattered areas of fatty atrophy versus small, simple lipomas are noted. Soft tissues Scattered calcifications are seen in subcutaneous tissues most notable about the distal lower leg and ankle. Subcutaneous edema is also seen and progressive distally. No focal fluid collection. IMPRESSION: Negative for acute bony or joint abnormality. Diffuse subcutaneous edema is progressive distally and could be due to dependent change or cellulitis. Scattered punctate calcifications in the subcutaneous tissues are most consistent with prior infectious or inflammatory process. 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 Electronically Signed   By: Inge Rise M.D.   On: 12/25/2017 10:32   Ct Extremity Lower Left Wo Contrast  Result Date: 12/25/2017 CLINICAL DATA:  Left lower extremity pain and swelling since a fall several days ago. Initial encounter. EXAM: CT OF THE LOWER LEFT EXTREMITY WITHOUT CONTRAST TECHNIQUE: Multidetector CT imaging of the lower left extremity was performed according to the standard protocol. COMPARISON:  Plain films left ankle 12/22/2017. FINDINGS: Bones/Joint/Cartilage No acute bony or joint abnormality is seen. No focal bony lesion is identified. Bones appear osteopenic. There is degenerative disease about the knee with joint space narrowing and osteophytosis appearing worst on the medial side. 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 Ligaments Suboptimally assessed  by CT. Major ligamentous structures appear intact. Muscles and Tendons No tear is identified. No intramuscular  fluid collection is seen. Scattered areas of fatty atrophy versus small, simple lipomas are noted. Soft tissues Scattered calcifications are seen in subcutaneous tissues most notable about the distal lower leg and ankle. Subcutaneous edema is also seen and progressive distally. No focal fluid collection. IMPRESSION: Negative for acute bony or joint abnormality. Diffuse subcutaneous edema is progressive distally and could be due to dependent change or cellulitis. Scattered punctate calcifications in the subcutaneous tissues are most consistent with prior infectious or inflammatory process. 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 Electronically Signed   By: Inge Rise M.D.   On: 12/25/2017 10:32    Procedures Procedures (including critical care time)  Medications Ordered in ED Medications  sodium chloride 0.9 % bolus 500 mL (0 mLs Intravenous Stopped 12/21/17 1854)  sodium chloride 0.9 % bolus 1,000 mL (0 mLs Intravenous Stopped 12/21/17 2125)  vancomycin (VANCOCIN) IVPB 1000 mg/200 mL premix (0 mg Intravenous Stopped 12/21/17 2125)  piperacillin-tazobactam (ZOSYN) IVPB 3.375 g (0 g Intravenous Stopped 12/21/17 1854)  vancomycin (VANCOCIN) 500 mg in sodium chloride 0.9 % 100 mL IVPB (500 mg Intravenous New Bag/Given 12/22/17 0220)     Initial Impression / Assessment and Plan / ED Course  I have reviewed the triage vital signs and the nursing notes.  Pertinent labs & imaging results that were available during my care of the patient were reviewed by me and considered in my medical decision making (see chart for details).     Patient with apparent cellulitis of the left lower extremity.  Per family patient continues not to be at her baseline.  CT head without acute findings.  Patient does have elevated lactic acid but  normal vital signs. Initiated IV antibiotics.  Discussed with hospitalist service who will admit.  Final Clinical Impressions(s) / ED Diagnoses   Final diagnoses:  Cellulitis of left lower extremity    ED Discharge Orders         Ordered    saccharomyces boulardii (FLORASTOR) 250 MG capsule  2 times daily     12/25/17 1038    amoxicillin-clavulanate (AUGMENTIN) 875-125 MG tablet  2 times daily     12/25/17 1040    traMADol (ULTRAM) 50 MG tablet  Every 6 hours PRN     12/25/17 1040    Increase activity slowly     12/25/17 1129    Diet - low sodium heart healthy     12/25/17 1129    Call MD for:  temperature >100.4     12/25/17 1129    Call MD for:  redness, tenderness, or signs of infection (pain, swelling, redness, odor or green/yellow discharge around incision site)     12/25/17 1129           Julianne Rice, MD 12/26/17 [phone]

## 2017-12-21 NOTE — Progress Notes (Signed)
*  Preliminary Results* Left lower extremity venous duplex completed. Left lower extremity is negative for deep vein thrombosis. There is no evidence of left Baker's cyst.  12/21/2017 3:44 PM  Susan [name]

## 2017-12-21 NOTE — ED Triage Notes (Signed)
Per [facility], pt picked up from home due to unwitnessed fall.   Family reported they were trying to call pt and she didn't answer. When arrived at the house they lying supine.  EMS reports possible UTI.  Baseline neck and back pain. EMS denies deformity

## 2017-12-22 ENCOUNTER — Inpatient Hospital Stay (HOSPITAL_COMMUNITY): Payer: PPO

## 2017-12-22 ENCOUNTER — Encounter (HOSPITAL_COMMUNITY): Payer: Self-pay | Admitting: *Deleted

## 2017-12-22 ENCOUNTER — Other Ambulatory Visit: Payer: Self-pay

## 2017-12-22 ENCOUNTER — Telehealth: Payer: Self-pay

## 2017-12-22 DIAGNOSIS — G9341 Metabolic encephalopathy: Secondary | ICD-10-CM

## 2017-12-22 DIAGNOSIS — I5032 Chronic diastolic (congestive) heart failure: Secondary | ICD-10-CM

## 2017-12-22 DIAGNOSIS — I69359 Hemiplegia and hemiparesis following cerebral infarction affecting unspecified side: Secondary | ICD-10-CM

## 2017-12-22 LAB — BASIC METABOLIC PANEL
Anion gap: 11 (ref 5–15)
BUN: 20 mg/dL (ref 8–23)
CO2: 21 mmol/L — ABNORMAL LOW (ref 22–32)
Calcium: 9.3 mg/dL (ref 8.9–10.3)
Chloride: 108 mmol/L (ref 98–111)
Creatinine, Ser: 1.49 mg/dL — ABNORMAL HIGH (ref 0.44–1.00)
GFR calc Af Amer: 38 mL/min — ABNORMAL LOW (ref >60)
GFR calc non Af Amer: 33 mL/min — ABNORMAL LOW (ref >60)
Glucose, Bld: 117 mg/dL — ABNORMAL HIGH (ref 70–99)
Potassium: 3.5 mmol/L (ref 3.5–5.1)
SODIUM: 140 mmol/L (ref 135–145)

## 2017-12-22 LAB — CBC
HCT: 30.5 % — ABNORMAL LOW (ref 36.0–46.0)
Hemoglobin: 9.5 g/dL — ABNORMAL LOW (ref 12.0–15.0)
MCH: 29.5 pg (ref 26.0–34.0)
MCHC: 31.1 g/dL (ref 30.0–36.0)
MCV: 94.7 fL (ref 80.0–100.0)
Platelets: 189 10*3/uL (ref 150–400)
RBC: 3.22 MIL/uL — ABNORMAL LOW (ref 3.87–5.11)
RDW: 14.2 % (ref 11.5–15.5)
WBC: 16.5 10*3/uL — ABNORMAL HIGH (ref 4.0–10.5)
nRBC: 0 % (ref 0.0–0.2)

## 2017-12-22 LAB — URINALYSIS, ROUTINE W REFLEX MICROSCOPIC
BACTERIA UA: NONE SEEN
Bilirubin Urine: NEGATIVE
Glucose, UA: NEGATIVE mg/dL
Ketones, ur: NEGATIVE mg/dL
LEUKOCYTES UA: NEGATIVE
Nitrite: NEGATIVE
Protein, ur: NEGATIVE mg/dL
SPECIFIC GRAVITY, URINE: 1.014 (ref 1.005–1.030)
pH: 7 (ref 5.0–8.0)

## 2017-12-22 MED ORDER — CALCIUM CARBONATE-VITAMIN D 600-400 MG-UNIT PO TABS: 1.0000 | ORAL_TABLET | Freq: Two times a day (BID) | ORAL | 2 refills | Status: DC

## 2017-12-22 NOTE — Telephone Encounter (Signed)
-----   Message from Lauree Chandler, NP sent at 12/22/2017  7:23 AM EST ----- Osteopenia noted. to make sure she is taking enough cal and vit d, recommended caltrate with D 600/400 twice a day with weight bearing activity as tolerated

## 2017-12-22 NOTE — Consult Note (Signed)
   [facility] CM Inpatient Consult   12/22/2017  Susan [name] 1940-04-24 [id number]  Patient was assessed for [facility] for community services. Patient was previously active with [facility] in the [place].  Met with patient at bedside regarding being restarted with [facility] services. [facility] had a difficult time maintaining contact with the patient. Met with patient at the bedside. No family available.  Patient was alert and oriented. However, she states that it is best to speak with her daughter, Susan [name],  and granddaughters.  Patient was eating her lunch.  A brochure and 24 hour nurse advise line given with contact information. Will follow progress and disposition needs.  She lives in an independent living facility.    Of note, [facility] Management services does not replace or interfere with any services that are arranged by inpatient case management or social work. For additional questions or referrals please contact:  Natividad Brood, RN BSN [facility] Liaison  [phone] business mobile phone Toll free office [phone]

## 2017-12-22 NOTE — Progress Notes (Signed)
PROGRESS NOTE    Susan [name]  VFI:[id number] DOB: [birth year] DOA: 12/21/2017 PCP: Lauree Chandler, NP   Brief Narrative:  HPI on 12/21/2017 by Dr. Domenic Polite Susan [name] is a 77 y.o. 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  She was also found to be more confused than baseline, unable to ambulate when EMS arrived, subsequently brought to the emergency room  Assessment & Plan   Severe sepsis secondary to left lower extremity cellulitis -Patient presented with fever, leukocytosis, tachypnea, altered mental status -Sepsis physiology improving -UA, chest x-ray reviewed and unremarkable for infection -Lower extremity Doppler showed no evidence of right common femoral vein obstruction.  No evidence of left DVT. -Blood cultures show no growth to date -Patient started on vancomycin  Acute metabolic encephalopathy -Suspect secondary to the above -Resolved -Patient appears to be at baseline, currently alert and oriented x3.  She is slow to respond-secondary to history of CVA.  Hip pain/Left ankle pain and edema secondary to Fall -X-rays of hips were unremarkable -Also had x-ray of the left knee, osseous demineralization with degenerative changes left knee.  No acute abnormalities. -X-ray ordered: No acute or significant chronic bony abnormality of the left ankle.  Diffuse soft tissue swelling. -Will treat symptomatically -Will consult PT  Chronic diastolic heart failure -Appears to be euvolemic and compensated at this time -Echocardiogram 04/28/2015 showed an EF 55 to 60%, no regional motion abnormalities (seems EF had improved when compared to prior echo of 35%) -Lasix currently held -Monitor intake  and output, daily weights  Persistent atrial fibrillation -Currently in sinus rhythm -Continue Eliquis, Coreg  Essential hypertension -Lasix and losartan currently held -Continue Coreg, hydralazine  History of CVA -With left-sided hemiplegia and mild dysarthria -CT head unremarkable for acute findings -Continue Eliquis  Mild hyperglycemia -Hemoglobin A1c  Chronic kidney disease, stage III -Creatinine appears to be at baseline -Continue to monitor BMP  DVT Prophylaxis Eliquis  Code Status: Full  Family Communication: None at bedside.  Disposition Plan: Admitted.  Pending improvement in patient's cellulitis as well as sepsis.  Consultants None  Procedures  Lower extremity Doppler  Antibiotics   Anti-infectives (From admission, onward)   Start     Dose/Rate Route Frequency Ordered Stop   12/22/17 2000  vancomycin (VANCOCIN) 500 mg in sodium chloride 0.9 % 100 mL IVPB     500 mg 100 mL/hr over 60 Minutes Intravenous Every 24 hours 12/21/17 1833     12/21/17 1845  vancomycin (VANCOCIN) 500 mg in sodium chloride 0.9 % 100 mL IVPB     500 mg 100 mL/hr over 60 Minutes Intravenous  Once 12/21/17 1833 12/22/17 0320   12/21/17 1615  vancomycin (VANCOCIN) IVPB 1000 mg/200 mL premix     1,000 mg 200 mL/hr over 60 Minutes Intravenous  Once 12/21/17 1602 12/21/17 2125   12/21/17 1615  piperacillin-tazobactam (ZOSYN) IVPB 3.375 g     3.375 g 100 mL/hr over 30 Minutes Intravenous  Once 12/21/17 1602 12/21/17 1854      Subjective:   Susan [name] seen and examined today.  Continues to complain of pain in the left leg especially with movement.  Denies current chest pain, shortness of breath, abdominal pain, nausea or vomiting, diarrhea or constipation.  Objective:   Vitals:  12/21/17 2247 12/22/17 0036 12/22/17 0518 12/22/17 0754  BP:   (!) 157/67 (!) 143/67  Pulse:   78 80  Resp:   (!) 25 20  Temp: (!) 102 F (38.9 C) 99.3 F (37.4 C) (!) 102.2 F (39 C) 100 F (37.8  C)  TempSrc: Oral Oral Oral Oral  SpO2:   96% 95%  Weight:      Height:        Intake/Output Summary (Last 24 hours) at 12/22/2017 1225 Last data filed at 12/22/2017 9211 Gross per 24 hour  Intake 1099.82 ml  Output 325 ml  Net 774.82 ml   Filed Weights   12/21/17 1507  Weight: 85.6 kg    Exam  General: Well developed, well nourished, NAD, appears stated age  HEENT: NCAT,  mucous membranes moist.   Neck: Supple  Cardiovascular: S1 S2 auscultated, soft SEM, RRR  Respiratory: Clear to auscultation bilaterally with equal chest rise  Abdomen: Soft, nontender, nondistended, + bowel sounds  Extremities: warm dry without cyanosis clubbing.  Edema of the left lower extremity and ankle  Neuro: AAOx3, nonfocal, mild chronic left-sided weakness.  Dysarthria-chronic.  Skin: Mild erythema LLE  Psych: Normal affect and demeanor, pleasant   Data Reviewed: I have personally reviewed following labs and imaging studies  CBC: Recent Labs  Lab 12/17/17 1007 12/21/17 1527 12/22/17 0442  WBC 2.9* 19.5* 16.5*  NEUTROABS 1,601 17.9*  --   HGB 9.5* 10.1* 9.5*  HCT 30.0* 31.8* 30.5*  MCV 93.5 94.9 94.7  PLT 235 208 941   Basic Metabolic Panel: Recent Labs  Lab 12/17/17 1007 12/21/17 1527 12/22/17 0442  NA 142 140 140  K 4.0 3.9 3.5  CL 110 105 108  CO2 26 22 21*  GLUCOSE 118 130* 117*  BUN 19 23 20   CREATININE 1.51* 1.49* 1.49*  CALCIUM 9.8 9.9 9.3   GFR: Estimated Creatinine Clearance: 31.1 mL/min (A) (by C-G formula based on SCr of 1.49 mg/dL (H)). Liver Function Tests: Recent Labs  Lab 12/17/17 1007 12/21/17 1527  AST 31 48*  ALT 21 29  ALKPHOS  --  50  BILITOT 0.3 0.6  PROT 6.3 6.9  ALBUMIN  --  3.6   No results for input(s): LIPASE, AMYLASE in the last 168 hours. No results for input(s): AMMONIA in the last 168 hours. Coagulation Profile: No results for input(s): INR, PROTIME in the last 168 hours. Cardiac Enzymes: No results for input(s):  CKTOTAL, CKMB, CKMBINDEX, TROPONINI in the last 168 hours. BNP (last 3 results) No results for input(s): PROBNP in the last 8760 hours. HbA1C: Recent Labs    12/21/17 1527  HGBA1C 5.5   CBG: No results for input(s): GLUCAP in the last 168 hours. Lipid Profile: No results for input(s): CHOL, HDL, LDLCALC, TRIG, CHOLHDL, LDLDIRECT in the last 72 hours. Thyroid Function Tests: No results for input(s): TSH, T4TOTAL, FREET4, T3FREE, THYROIDAB in the last 72 hours. Anemia Panel: No results for input(s): VITAMINB12, FOLATE, FERRITIN, TIBC, IRON, RETICCTPCT in the last 72 hours. Urine analysis:    Component Value Date/Time   COLORURINE YELLOW 12/22/2017 0236   APPEARANCEUR CLEAR 12/22/2017 0236   LABSPEC 1.014 12/22/2017 0236   PHURINE 7.0 12/22/2017 0236   GLUCOSEU NEGATIVE 12/22/2017 0236   HGBUR MODERATE (A) 12/22/2017 0236   BILIRUBINUR NEGATIVE 12/22/2017 0236   KETONESUR NEGATIVE 12/22/2017 0236   PROTEINUR NEGATIVE 12/22/2017 0236   UROBILINOGEN 0.2 03/05/2013 1259   NITRITE NEGATIVE 12/22/2017 0236   LEUKOCYTESUR NEGATIVE 12/22/2017 0236  Sepsis Labs: @LABRCNTIP (procalcitonin:4,lacticidven:4)  ) Recent Results (from the past 240 hour(s))  Culture, blood (Routine X 2) w Reflex to ID Panel     Status: None (Preliminary result)   Collection Time: 12/21/17  3:27 PM  Result Value Ref Range Status   Specimen Description BLOOD BLOOD LEFT ARM  Final   Special Requests   Final    BOTTLES DRAWN AEROBIC AND ANAEROBIC Blood Culture adequate volume   Culture   Final    NO GROWTH < 24 HOURS Performed at [facility], [address], [place], [place] [address]    Report Status PENDING  Incomplete  Culture, blood (Routine X 2) w Reflex to ID Panel     Status: None (Preliminary result)   Collection Time: 12/21/17  3:29 PM  Result Value Ref Range Status   Specimen Description BLOOD LEFT ANTECUBITAL  Final   Special Requests   Final    BOTTLES DRAWN AEROBIC AND ANAEROBIC  Blood Culture adequate volume   Culture   Final    NO GROWTH < 24 HOURS Performed at [facility], [place] [address], [place], [place] [address]    Report Status PENDING  Incomplete      Radiology Studies: Dg Chest 2 View  Result Date: 12/21/2017 CLINICAL DATA:  [place] at home, UTI EXAM: CHEST - 2 VIEW COMPARISON:  11/21/2015 FINDINGS: Enlargement of cardiac silhouette with pulmonary vascular congestion. Atherosclerotic calcification aorta. Interstitial prominence in both lungs increased since previous exam question minimal failure. No segmental consolidation, pleural effusion or pneumothorax. Bones diffusely demineralized. IMPRESSION: Enlargement of cardiac silhouette with pulmonary vascular congestion and question minimal failure. Electronically Signed   By: Lavonia Dana M.D.   On: 12/21/2017 16:52   Dg Ankle Complete Left  Result Date: 12/22/2017 CLINICAL DATA:  Recent fall at home with swelling and tenderness of the left ankle with difficulty walking. EXAM: LEFT ANKLE COMPLETE - 3+ VIEW COMPARISON:  Left ankle series dated April 13, 2017 FINDINGS: The bones are subjectively adequately mineralized. The ankle joint mortise is preserved. The talar dome is intact. There is no acute malleolar fracture. The talus and calcaneus are intact. There are plantar and Achilles region calcaneal spurs. The observed portions of the more distal tarsal bones appear normal. There are soft tissue calcifications most compatible with phleboliths. There is diffuse soft tissue calcification. IMPRESSION: There is no acute or significant chronic bony abnormality of the left ankle. There is diffuse soft tissue swelling. Electronically Signed   By: David  Martinique M.D.   On: 12/22/2017 10:34   Ct Head Wo Contrast  Result Date: 12/21/2017 CLINICAL DATA:  Unwitnessed fall EXAM: CT HEAD WITHOUT CONTRAST CT CERVICAL SPINE WITHOUT CONTRAST TECHNIQUE: Multidetector CT imaging of the head and cervical spine was performed  following the standard protocol without intravenous contrast. Multiplanar CT image reconstructions of the cervical spine were also generated. COMPARISON:  Head CT 01/25/2015 FINDINGS: CT HEAD FINDINGS Brain: There is no mass, hemorrhage or extra-axial collection. There is generalized atrophy without lobar predilection. There is hypoattenuation of the periventricular white matter, most commonly indicating chronic ischemic microangiopathy. Vascular: No abnormal hyperdensity of the major intracranial arteries or dural venous sinuses. No intracranial atherosclerosis. Skull: The visualized skull base, calvarium and extracranial soft tissues are normal. Sinuses/Orbits: Moderate paranasal sinus opacification. No fluid levels. The orbits are normal. CT CERVICAL SPINE FINDINGS Alignment: Reversal of normal cervical lordosis. Grade 1 C2-3 anterolisthesis. Facet alignment is normal. Occipital condyles are aligned with the lateral masses  of C1 and C2. Skull base and vertebrae: No acute fracture. Soft tissues and spinal canal: No prevertebral fluid or swelling. No visible canal hematoma. Disc levels: There is multilevel severe disc space narrowing, predominantly in the upper cervical spine. Uncovertebral hypertrophy contributes to foraminal stenosis greatest at right C3-4, C4-5 and C5-6. No bony spinal canal stenosis. Upper chest: No pneumothorax, pulmonary nodule or pleural effusion. Other: Normal visualized paraspinal cervical soft tissues. IMPRESSION: 1. Atrophy and chronic ischemic microangiopathy without acute intracranial abnormality. 2. No acute fracture of the cervical spine. Electronically Signed   By: Ulyses Jarred M.D.   On: 12/21/2017 17:16   Ct Cervical Spine Wo Contrast  Result Date: 12/21/2017 CLINICAL DATA:  Unwitnessed fall EXAM: CT HEAD WITHOUT CONTRAST CT CERVICAL SPINE WITHOUT CONTRAST TECHNIQUE: Multidetector CT imaging of the head and cervical spine was performed following the standard protocol without  intravenous contrast. Multiplanar CT image reconstructions of the cervical spine were also generated. COMPARISON:  Head CT 01/25/2015 FINDINGS: CT HEAD FINDINGS Brain: There is no mass, hemorrhage or extra-axial collection. There is generalized atrophy without lobar predilection. There is hypoattenuation of the periventricular white matter, most commonly indicating chronic ischemic microangiopathy. Vascular: No abnormal hyperdensity of the major intracranial arteries or dural venous sinuses. No intracranial atherosclerosis. Skull: The visualized skull base, calvarium and extracranial soft tissues are normal. Sinuses/Orbits: Moderate paranasal sinus opacification. No fluid levels. The orbits are normal. CT CERVICAL SPINE FINDINGS Alignment: Reversal of normal cervical lordosis. Grade 1 C2-3 anterolisthesis. Facet alignment is normal. Occipital condyles are aligned with the lateral masses of C1 and C2. Skull base and vertebrae: No acute fracture. Soft tissues and spinal canal: No prevertebral fluid or swelling. No visible canal hematoma. Disc levels: There is multilevel severe disc space narrowing, predominantly in the upper cervical spine. Uncovertebral hypertrophy contributes to foraminal stenosis greatest at right C3-4, C4-5 and C5-6. No bony spinal canal stenosis. Upper chest: No pneumothorax, pulmonary nodule or pleural effusion. Other: Normal visualized paraspinal cervical soft tissues. IMPRESSION: 1. Atrophy and chronic ischemic microangiopathy without acute intracranial abnormality. 2. No acute fracture of the cervical spine. Electronically Signed   By: Ulyses Jarred M.D.   On: 12/21/2017 17:16   Dg Knee Complete 4 Views Left  Result Date: 12/21/2017 CLINICAL DATA:  Unwitnessed fall at home, generalized LEFT knee pain EXAM: LEFT KNEE - COMPLETE 4+ VIEW COMPARISON:  09/26/2017 FINDINGS: Osseous demineralization. Diffuse joint space narrowing and spur formation greatest at lateral compartment. Subchondral  sclerosis at lateral compartment. No acute fracture, dislocation, or bone destruction. No knee joint effusion. Small patellar spur at quadriceps tendon insertion. IMPRESSION: Osseous demineralization with degenerative changes LEFT knee. No acute abnormalities. Electronically Signed   By: Lavonia Dana M.D.   On: 12/21/2017 16:51   Dg Hips Bilat With Pelvis 3-4 Views  Result Date: 12/21/2017 CLINICAL DATA:  Bilateral hip pain since a fall tonight. Initial encounter. EXAM: DG HIP (WITH OR WITHOUT PELVIS) 3-4V BILAT COMPARISON:  None. FINDINGS: There is no evidence of hip fracture or dislocation. There is no evidence of arthropathy or other focal bone abnormality. Mesh from hernia repair noted. IMPRESSION: Negative exam. Electronically Signed   By: Inge Rise M.D.   On: 12/21/2017 20:26   Vas [place] Lower Extremity Venous (dvt) (only [facility])  Result Date: 12/21/2017  Lower Venous Study Indications: Swelling, and Edema.  Performing Technologist: Abram Sander RVS  Examination Guidelines: A complete evaluation includes B-mode imaging, spectral Doppler, color Doppler, and power Doppler as needed  of all accessible portions of each vessel. Bilateral testing is considered an integral part of a complete examination. Limited examinations for reoccurring indications may be performed as noted.  Right Venous Findings: +---+---------------+---------+-----------+----------+-------+    CompressibilityPhasicitySpontaneityPropertiesSummary +---+---------------+---------+-----------+----------+-------+ CFVFull           Yes      Yes                          +---+---------------+---------+-----------+----------+-------+  Left Venous Findings: +---------+---------------+---------+-----------+----------+--------------+          CompressibilityPhasicitySpontaneityPropertiesSummary        +---------+---------------+---------+-----------+----------+--------------+ CFV      Full           Yes      Yes                                  +---------+---------------+---------+-----------+----------+--------------+ SFJ      Full                                                        +---------+---------------+---------+-----------+----------+--------------+ FV Prox  Full                                                        +---------+---------------+---------+-----------+----------+--------------+ FV Mid   Full                                                        +---------+---------------+---------+-----------+----------+--------------+ FV DistalFull                                                        +---------+---------------+---------+-----------+----------+--------------+ PFV      Full                                                        +---------+---------------+---------+-----------+----------+--------------+ POP      Full           Yes      Yes                                 +---------+---------------+---------+-----------+----------+--------------+ PTV      Full                                                        +---------+---------------+---------+-----------+----------+--------------+ PERO  Not visualized +---------+---------------+---------+-----------+----------+--------------+    Summary: Right: No evidence of common femoral vein obstruction. Left: There is no evidence of deep vein thrombosis in the lower extremity. No cystic structure found in the popliteal fossa.  *See table(s) above for measurements and observations. Electronically signed by Deitra Mayo MD on 12/21/2017 at 5:51:56 PM.    Final      Scheduled Meds: . apixaban  5 mg Oral Q12H  . buPROPion  450 mg Oral Daily  . carvedilol  12.5 mg Oral BID WC  . donepezil  10 mg Oral QHS  . gabapentin  300 mg Oral BID  . hydrALAZINE  50 mg Oral BID  . pravastatin  40 mg Oral QHS  . sertraline  12.5 mg Oral BID   Continuous  Infusions: . vancomycin       LOS: 1 day   Time Spent in minutes   30 minutes  Kaley Jutras D.O. on 12/22/2017 at 12:25 PM  Between 7am to 7pm - Please see pager noted on [email or web address]  After 7pm go to [email or web address]  And look for the night coverage person covering for me after hours  [facility] Office  [phone]

## 2017-12-23 LAB — BASIC METABOLIC PANEL
Anion gap: 11 (ref 5–15)
BUN: 24 mg/dL — ABNORMAL HIGH (ref 8–23)
CO2: 19 mmol/L — ABNORMAL LOW (ref 22–32)
Calcium: 9.3 mg/dL (ref 8.9–10.3)
Chloride: 109 mmol/L (ref 98–111)
Creatinine, Ser: 1.66 mg/dL — ABNORMAL HIGH (ref 0.44–1.00)
GFR calc Af Amer: 34 mL/min — ABNORMAL LOW (ref >60)
GFR calc non Af Amer: 29 mL/min — ABNORMAL LOW (ref >60)
Glucose, Bld: 103 mg/dL — ABNORMAL HIGH (ref 70–99)
Potassium: 3.6 mmol/L (ref 3.5–5.1)
Sodium: 139 mmol/L (ref 135–145)

## 2017-12-23 LAB — CBC
HEMATOCRIT: 29.5 % — AB (ref 36.0–46.0)
Hemoglobin: 9.5 g/dL — ABNORMAL LOW (ref 12.0–15.0)
MCH: 30.7 pg (ref 26.0–34.0)
MCHC: 32.2 g/dL (ref 30.0–36.0)
MCV: 95.5 fL (ref 80.0–100.0)
NRBC: 0 % (ref 0.0–0.2)
Platelets: 169 10*3/uL (ref 150–400)
RBC: 3.09 MIL/uL — ABNORMAL LOW (ref 3.87–5.11)
RDW: 14.4 % (ref 11.5–15.5)
WBC: 13.8 10*3/uL — ABNORMAL HIGH (ref 4.0–10.5)

## 2017-12-23 NOTE — Evaluation (Signed)
Physical Therapy Evaluation Patient Details Name: Susan [name] MRN: [id number] DOB: 09/17/1940 Today's Date: 12/23/2017   History of Present Illness  Pt is a 77 y.o. female admitted after a fall, found to have sepsis secondary to L LE cellulitis. CT: no acute abnormalities. Xray of hips and L ankle unremarkable. PMH Dementia, L sided hemiparesis and dysarthria from CVA in 2017, CHF, afib on Eliquis, HLD, incontinence, ongoing L knee issues (has been evaluated for TKA).  Clinical Impression  Pt admitted with above diagnosis. Pt presents with generalized weakness, baseline cognitive impairments and L ankle pain limiting her ability to perform bed mobility, transfer or amb safely and independently. Pt required max A for rolling and total A for pericare, session was limited by pain in L ankle. Noted 4+ edema in L ankle. Pt will benefit from skilled PT to increase her independence and safety with mobility to allow discharge to the venue SNF.       Follow Up Recommendations SNF    Equipment Recommendations  None recommended by PT    Recommendations for Other Services       Precautions / Restrictions Precautions Precautions: Fall Restrictions Weight Bearing Restrictions: No      Mobility  Bed Mobility Overal bed mobility: Needs Assistance Bed Mobility: Rolling Rolling: Max assist;Total assist;+2 for safety/equipment         General bed mobility comments: Pt limited in bed mobility due to excruciating pain in L ankle with any movement including hip abduction with total A to progress leg to EOB. Max A for rolling, pt able to provide some assist by reaching arms to bedrails and held on while in sidelying for pericare. Required total A for performance of pericare. Total A for centering in bed with chuck pads.   Transfers                    Ambulation/Gait                Stairs            Wheelchair Mobility    Modified Rankin (Stroke Patients Only)        Balance                                             Pertinent Vitals/Pain Pain Assessment: Faces Faces Pain Scale: Hurts whole lot Pain Location: L ankle Pain Descriptors / Indicators: Grimacing Pain Intervention(s): Monitored during session;Limited activity within patient's tolerance;Repositioned    Home Living Family/patient expects to be discharged to:: Assisted living                      Prior Function Level of Independence: Needs assistance   Gait / Transfers Assistance Needed: uses RW     Comments: Pt states she is independent with ADLs, bathing, washing etc but PLOF/hx telling unreliable.      Hand Dominance   Dominant Hand: Right    Extremity/Trunk Assessment   Upper Extremity Assessment Upper Extremity Assessment: Defer to OT evaluation    Lower Extremity Assessment Lower Extremity Assessment: LLE deficits/detail;RLE deficits/detail RLE Deficits / Details: limited AROM. LLE Deficits / Details: Noted pitting edema of 4+ in ankle. Limited ankle ROM secondary to pain and swelling.  LLE: Unable to fully assess due to pain       Communication   Communication: Expressive difficulties  Cognition Arousal/Alertness: Lethargic Behavior During Therapy: WFL for tasks assessed/performed Overall Cognitive Status: History of cognitive impairments - at baseline Area of Impairment: Memory;Attention;Awareness;Following commands                   Current Attention Level: Sustained Memory: Decreased short-term memory Following Commands: Follows one step commands inconsistently       General Comments: Pt frequently dozing off and having difficulty keeping eyes open. Pt reported PTA she was independent with RW at assisted living but due to history of cognitive impairments, unable to determine reliability of history. Pt asked to move ankle and would lift leg off the bed, asked to bed L knee and would bend R knee.       General  Comments      Exercises     Assessment/Plan    PT Assessment Patient needs continued PT services  PT Problem List Decreased strength;Decreased mobility;Decreased safety awareness;Decreased activity tolerance;Decreased cognition;Decreased balance;Pain       PT Treatment Interventions Gait training;Therapeutic exercise;Therapeutic activities;Cognitive remediation;Patient/family education;Balance training;Stair training;Functional mobility training;Neuromuscular re-education    PT Goals (Current goals can be found in the Care Plan section)  Acute Rehab PT Goals Patient Stated Goal: decreased pain PT Goal Formulation: With patient Time For Goal Achievement: 01/06/18 Potential to Achieve Goals: Good    Frequency Min 2X/week   Barriers to discharge        Co-evaluation               AM-PAC PT "6 Clicks" Mobility  Outcome Measure Help needed turning from your back to your side while in a flat bed without using bedrails?: A Lot Help needed moving from lying on your back to sitting on the side of a flat bed without using bedrails?: A Lot Help needed moving to and from a bed to a chair (including a wheelchair)?: Total Help needed standing up from a chair using your arms (e.g., wheelchair or bedside chair)?: Total Help needed to walk in hospital room?: Total Help needed climbing 3-5 steps with a railing? : Total 6 Click Score: 8    End of Session   Activity Tolerance: Patient limited by pain Patient left: in bed;with call bell/phone within reach;with bed alarm set Nurse Communication: Mobility status(swelling, pericare) PT Visit Diagnosis: Muscle weakness (generalized) (M62.81);Pain Pain - Right/Left: Left Pain - part of body: Ankle and joints of foot    Time: 5400-8676 PT Time Calculation (min) (ACUTE ONLY): 30 min   Charges:   PT Evaluation $PT Eval Moderate Complexity: 1 Mod PT Treatments $Therapeutic Activity: 8-22 mins        Kyri Shader, SPT Acute  Rehab Services [phone]  Susan [name] 12/23/2017, 1:53 PM

## 2017-12-23 NOTE — Progress Notes (Signed)
Placed patient on CPAP for the night via auto-mode with minimum pressure set at 6cm and maximum pressure set at 20cm  

## 2017-12-23 NOTE — Progress Notes (Signed)
Patient refused CPAP for tonight. Rt will monitor as needed.

## 2017-12-23 NOTE — Progress Notes (Addendum)
PROGRESS NOTE    Susan [name]  OJJ:[id number] DOB: March 20, 1940 DOA: 12/21/2017 PCP: Lauree Chandler, NP   Brief Narrative:  HPI on 12/21/2017 by Dr. Domenic Polite Susan [name] is a 77 y.o. 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  She was also found to be more confused than baseline, unable to ambulate when EMS arrived, subsequently brought to the emergency room  Interim history Admitted for sepsis secondary to left lower extremity cellulitis, currently on vancomycin. Assessment & Plan   Severe sepsis secondary to left lower extremity cellulitis -Patient presented with fever, leukocytosis, tachypnea, altered mental status -Sepsis physiology improving- however continues to have fevers -UA, chest x-ray reviewed and unremarkable for infection -Lower extremity Doppler showed no evidence of right common femoral vein obstruction.  No evidence of left DVT. -Blood cultures show no growth to date -Patient started on vancomycin -cellulitis not improving- still with edema and erythema, warmth on exam  Acute metabolic encephalopathy -Suspect secondary to the above -Resolved -Patient appears to be at baseline, currently alert and oriented x3.  She is slow to respond, secondary to history of CVA. -supposedly patient has history of dementia and on aricept  Hip pain/Left ankle pain and edema secondary to Fall -X-rays of hips were unremarkable -Also had x-ray of the left knee, osseous demineralization with degenerative changes left knee.  No acute abnormalities. -X-ray left ankle: No acute or significant chronic bony abnormality of the left ankle.  Diffuse soft tissue swelling. -Elevate LLE -continue symptomatic  treatment -PT/OT consulted  Chronic diastolic heart failure -Appears to be euvolemic and compensated at this time -Echocardiogram 04/28/2015 showed an EF 55 to 60%, no regional motion abnormalities (seems EF had improved when compared to prior echo of 35%) -Lasix currently held -Monitor intake and output, daily weights  Persistent atrial fibrillation -Currently in sinus rhythm -Continue Eliquis, Coreg  Essential hypertension -Lasix and losartan currently held -Continue Coreg, hydralazine  History of CVA -With left-sided hemiplegia and mild dysarthria -CT head unremarkable for acute findings -Continue Eliquis  Mild hyperglycemia -Hemoglobin A1c  Chronic kidney disease, stage III -Creatinine 1.66 today, GFR still within stage III -Continue to monitor BMP  DVT Prophylaxis Eliquis  Code Status: Full  Family Communication: None at bedside.  Disposition Plan: Admitted.  Pending improvement in patient's cellulitis as well as sepsis. Pending PT/OT eval.  Consultants None  Procedures  Lower extremity Doppler  Antibiotics   Anti-infectives (From admission, onward)   Start     Dose/Rate Route Frequency Ordered Stop   12/22/17 2000  vancomycin (VANCOCIN) 500 mg in sodium chloride 0.9 % 100 mL IVPB     500 mg 100 mL/hr over 60 Minutes Intravenous Every 24 hours 12/21/17 1833     12/21/17 1845  vancomycin (VANCOCIN) 500 mg in sodium chloride 0.9 % 100 mL IVPB     500 mg 100 mL/hr over 60 Minutes Intravenous  Once 12/21/17 1833 12/22/17 0320   12/21/17 1615  vancomycin (VANCOCIN) IVPB 1000 mg/200 mL premix     1,000 mg 200 mL/hr over 60 Minutes Intravenous  Once 12/21/17 1602 12/21/17 2125   12/21/17 1615  piperacillin-tazobactam (ZOSYN) IVPB 3.375 g     3.375 g 100 mL/hr over 30 Minutes Intravenous  Once 12/21/17 1602 12/21/17 1854  Subjective:   Montgomery Rothlisberger seen and examined today.  Continues to complain of pain in the left leg.  Denies current chest pain,  shortness breath, abdominal pain, nausea or vomiting, diarrhea or constipation, dizziness or headache.  Objective:   Vitals:   12/22/17 2358 12/23/17 0343 12/23/17 0718 12/23/17 0936  BP:  (!) 145/65  (!) 124/50  Pulse: 95 (!) 59  70  Resp: 19 20  18   Temp:  (!) 100.5 F (38.1 C) 100.3 F (37.9 C)   TempSrc:  Axillary Oral   SpO2: 95% 100%  94%  Weight:      Height:        Intake/Output Summary (Last 24 hours) at 12/23/2017 1139 Last data filed at 12/22/2017 1854 Gross per 24 hour  Intake 490 ml  Output 250 ml  Net 240 ml   Filed Weights   12/21/17 1507  Weight: 85.6 kg   Exam  General: Well developed, well nourished, NAD, appears stated age  HEENT: NCAT, mucous membranes moist.   Neck: Supple  Cardiovascular: S1 S2 auscultated, soft SEM, RRR  Respiratory: Clear to auscultation bilaterally with equal chest rise  Abdomen: Soft, nontender, nondistended, + bowel sounds  Extremities: warm dry without cyanosis clubbing.  LLE edema, erythema to palpation  Neuro: AAOx3, slow to respond, dysarthric which is chronic.  Mild chronic left-sided weakness.  Otherwise nonfocal.  Skin: Erythema of the left lower extremity  Psych: Appropriate mood and affect  Data Reviewed: I have personally reviewed following labs and imaging studies  CBC: Recent Labs  Lab 12/17/17 1007 12/21/17 1527 12/22/17 0442 12/23/17 0355  WBC 2.9* 19.5* 16.5* 13.8*  NEUTROABS 1,601 17.9*  --   --   HGB 9.5* 10.1* 9.5* 9.5*  HCT 30.0* 31.8* 30.5* 29.5*  MCV 93.5 94.9 94.7 95.5  PLT 235 208 189 967   Basic Metabolic Panel: Recent Labs  Lab 12/17/17 1007 12/21/17 1527 12/22/17 0442 12/23/17 0355  NA 142 140 140 139  K 4.0 3.9 3.5 3.6  CL 110 105 108 109  CO2 26 22 21* 19*  GLUCOSE 118 130* 117* 103*  BUN 19 23 20  24*  CREATININE 1.51* 1.49* 1.49* 1.66*  CALCIUM 9.8 9.9 9.3 9.3   GFR: Estimated Creatinine Clearance: 27.9 mL/min (A) (by C-G formula based on SCr of 1.66 mg/dL  (H)). Liver Function Tests: Recent Labs  Lab 12/17/17 1007 12/21/17 1527  AST 31 48*  ALT 21 29  ALKPHOS  --  50  BILITOT 0.3 0.6  PROT 6.3 6.9  ALBUMIN  --  3.6   No results for input(s): LIPASE, AMYLASE in the last 168 hours. No results for input(s): AMMONIA in the last 168 hours. Coagulation Profile: No results for input(s): INR, PROTIME in the last 168 hours. Cardiac Enzymes: No results for input(s): CKTOTAL, CKMB, CKMBINDEX, TROPONINI in the last 168 hours. BNP (last 3 results) No results for input(s): PROBNP in the last 8760 hours. HbA1C: Recent Labs    12/21/17 1527  HGBA1C 5.5   CBG: No results for input(s): GLUCAP in the last 168 hours. Lipid Profile: No results for input(s): CHOL, HDL, LDLCALC, TRIG, CHOLHDL, LDLDIRECT in the last 72 hours. Thyroid Function Tests: No results for input(s): TSH, T4TOTAL, FREET4, T3FREE, THYROIDAB in the last 72 hours. Anemia Panel: No results for input(s): VITAMINB12, FOLATE, FERRITIN, TIBC, IRON, RETICCTPCT in the last 72 hours. Urine analysis:    Component Value Date/Time   COLORURINE YELLOW 12/22/2017 0236   APPEARANCEUR CLEAR 12/22/2017 0236  LABSPEC 1.014 12/22/2017 0236   PHURINE 7.0 12/22/2017 0236   GLUCOSEU NEGATIVE 12/22/2017 0236   HGBUR MODERATE (A) 12/22/2017 0236   BILIRUBINUR NEGATIVE 12/22/2017 0236   KETONESUR NEGATIVE 12/22/2017 0236   PROTEINUR NEGATIVE 12/22/2017 0236   UROBILINOGEN 0.2 03/05/2013 1259   NITRITE NEGATIVE 12/22/2017 0236   LEUKOCYTESUR NEGATIVE 12/22/2017 0236   Sepsis Labs: @LABRCNTIP (procalcitonin:4,lacticidven:4)  ) Recent Results (from the past 240 hour(s))  Culture, blood (Routine X 2) w Reflex to ID Panel     Status: None (Preliminary result)   Collection Time: 12/21/17  3:27 PM  Result Value Ref Range Status   Specimen Description BLOOD BLOOD LEFT ARM  Final   Special Requests   Final    BOTTLES DRAWN AEROBIC AND ANAEROBIC Blood Culture adequate volume   Culture   Final     NO GROWTH 2 DAYS Performed at [facility], [place] [address] [place], [place] [address]    Report Status PENDING  Incomplete  Culture, blood (Routine X 2) w Reflex to ID Panel     Status: None (Preliminary result)   Collection Time: 12/21/17  3:29 PM  Result Value Ref Range Status   Specimen Description BLOOD LEFT ANTECUBITAL  Final   Special Requests   Final    BOTTLES DRAWN AEROBIC AND ANAEROBIC Blood Culture adequate volume   Culture   Final    NO GROWTH 2 DAYS Performed at [facility], [place] [address] [place], [place] [address]    Report Status PENDING  Incomplete      Radiology Studies: Dg Chest 2 View  Result Date: 12/21/2017 CLINICAL DATA:  [place] at home, UTI EXAM: CHEST - 2 VIEW COMPARISON:  11/21/2015 FINDINGS: Enlargement of cardiac silhouette with pulmonary vascular congestion. Atherosclerotic calcification aorta. Interstitial prominence in both lungs increased since previous exam question minimal failure. No segmental consolidation, pleural effusion or pneumothorax. Bones diffusely demineralized. IMPRESSION: Enlargement of cardiac silhouette with pulmonary vascular congestion and question minimal failure. Electronically Signed   By: Lavonia Dana M.D.   On: 12/21/2017 16:52   Dg Ankle Complete Left  Result Date: 12/22/2017 CLINICAL DATA:  Recent fall at home with swelling and tenderness of the left ankle with difficulty walking. EXAM: LEFT ANKLE COMPLETE - 3+ VIEW COMPARISON:  Left ankle series dated April 13, 2017 FINDINGS: The bones are subjectively adequately mineralized. The ankle joint mortise is preserved. The talar dome is intact. There is no acute malleolar fracture. The talus and calcaneus are intact. There are plantar and Achilles region calcaneal spurs. The observed portions of the more distal tarsal bones appear normal. There are soft tissue calcifications most compatible with phleboliths. There is diffuse soft tissue calcification. IMPRESSION: There is  no acute or significant chronic bony abnormality of the left ankle. There is diffuse soft tissue swelling. Electronically Signed   By: David  Martinique M.D.   On: 12/22/2017 10:34   Ct Head Wo Contrast  Result Date: 12/21/2017 CLINICAL DATA:  Unwitnessed fall EXAM: CT HEAD WITHOUT CONTRAST CT CERVICAL SPINE WITHOUT CONTRAST TECHNIQUE: Multidetector CT imaging of the head and cervical spine was performed following the standard protocol without intravenous contrast. Multiplanar CT image reconstructions of the cervical spine were also generated. COMPARISON:  Head CT 01/25/2015 FINDINGS: CT HEAD FINDINGS Brain: There is no mass, hemorrhage or extra-axial collection. There is generalized atrophy without lobar predilection. There is hypoattenuation of the periventricular white matter, most commonly indicating chronic ischemic microangiopathy. Vascular: No abnormal hyperdensity of the major  intracranial arteries or dural venous sinuses. No intracranial atherosclerosis. Skull: The visualized skull base, calvarium and extracranial soft tissues are normal. Sinuses/Orbits: Moderate paranasal sinus opacification. No fluid levels. The orbits are normal. CT CERVICAL SPINE FINDINGS Alignment: Reversal of normal cervical lordosis. Grade 1 C2-3 anterolisthesis. Facet alignment is normal. Occipital condyles are aligned with the lateral masses of C1 and C2. Skull base and vertebrae: No acute fracture. Soft tissues and spinal canal: No prevertebral fluid or swelling. No visible canal hematoma. Disc levels: There is multilevel severe disc space narrowing, predominantly in the upper cervical spine. Uncovertebral hypertrophy contributes to foraminal stenosis greatest at right C3-4, C4-5 and C5-6. No bony spinal canal stenosis. Upper chest: No pneumothorax, pulmonary nodule or pleural effusion. Other: Normal visualized paraspinal cervical soft tissues. IMPRESSION: 1. Atrophy and chronic ischemic microangiopathy without acute intracranial  abnormality. 2. No acute fracture of the cervical spine. Electronically Signed   By: Ulyses Jarred M.D.   On: 12/21/2017 17:16   Ct Cervical Spine Wo Contrast  Result Date: 12/21/2017 CLINICAL DATA:  Unwitnessed fall EXAM: CT HEAD WITHOUT CONTRAST CT CERVICAL SPINE WITHOUT CONTRAST TECHNIQUE: Multidetector CT imaging of the head and cervical spine was performed following the standard protocol without intravenous contrast. Multiplanar CT image reconstructions of the cervical spine were also generated. COMPARISON:  Head CT 01/25/2015 FINDINGS: CT HEAD FINDINGS Brain: There is no mass, hemorrhage or extra-axial collection. There is generalized atrophy without lobar predilection. There is hypoattenuation of the periventricular white matter, most commonly indicating chronic ischemic microangiopathy. Vascular: No abnormal hyperdensity of the major intracranial arteries or dural venous sinuses. No intracranial atherosclerosis. Skull: The visualized skull base, calvarium and extracranial soft tissues are normal. Sinuses/Orbits: Moderate paranasal sinus opacification. No fluid levels. The orbits are normal. CT CERVICAL SPINE FINDINGS Alignment: Reversal of normal cervical lordosis. Grade 1 C2-3 anterolisthesis. Facet alignment is normal. Occipital condyles are aligned with the lateral masses of C1 and C2. Skull base and vertebrae: No acute fracture. Soft tissues and spinal canal: No prevertebral fluid or swelling. No visible canal hematoma. Disc levels: There is multilevel severe disc space narrowing, predominantly in the upper cervical spine. Uncovertebral hypertrophy contributes to foraminal stenosis greatest at right C3-4, C4-5 and C5-6. No bony spinal canal stenosis. Upper chest: No pneumothorax, pulmonary nodule or pleural effusion. Other: Normal visualized paraspinal cervical soft tissues. IMPRESSION: 1. Atrophy and chronic ischemic microangiopathy without acute intracranial abnormality. 2. No acute fracture of  the cervical spine. Electronically Signed   By: Ulyses Jarred M.D.   On: 12/21/2017 17:16   Dg Knee Complete 4 Views Left  Result Date: 12/21/2017 CLINICAL DATA:  Unwitnessed fall at home, generalized LEFT knee pain EXAM: LEFT KNEE - COMPLETE 4+ VIEW COMPARISON:  09/26/2017 FINDINGS: Osseous demineralization. Diffuse joint space narrowing and spur formation greatest at lateral compartment. Subchondral sclerosis at lateral compartment. No acute fracture, dislocation, or bone destruction. No knee joint effusion. Small patellar spur at quadriceps tendon insertion. IMPRESSION: Osseous demineralization with degenerative changes LEFT knee. No acute abnormalities. Electronically Signed   By: Lavonia Dana M.D.   On: 12/21/2017 16:51   Dg Hips Bilat With Pelvis 3-4 Views  Result Date: 12/21/2017 CLINICAL DATA:  Bilateral hip pain since a fall tonight. Initial encounter. EXAM: DG HIP (WITH OR WITHOUT PELVIS) 3-4V BILAT COMPARISON:  None. FINDINGS: There is no evidence of hip fracture or dislocation. There is no evidence of arthropathy or other focal bone abnormality. Mesh from hernia repair noted. IMPRESSION: Negative exam. Electronically Signed  By: Inge Rise M.D.   On: 12/21/2017 20:26   Vas [place] Lower Extremity Venous (dvt) (only [facility])  Result Date: 12/21/2017  Lower Venous Study Indications: Swelling, and Edema.  Performing Technologist: Abram Sander RVS  Examination Guidelines: A complete evaluation includes B-mode imaging, spectral Doppler, color Doppler, and power Doppler as needed of all accessible portions of each vessel. Bilateral testing is considered an integral part of a complete examination. Limited examinations for reoccurring indications may be performed as noted.  Right Venous Findings: +---+---------------+---------+-----------+----------+-------+    CompressibilityPhasicitySpontaneityPropertiesSummary +---+---------------+---------+-----------+----------+-------+ CFVFull            Yes      Yes                          +---+---------------+---------+-----------+----------+-------+  Left Venous Findings: +---------+---------------+---------+-----------+----------+--------------+          CompressibilityPhasicitySpontaneityPropertiesSummary        +---------+---------------+---------+-----------+----------+--------------+ CFV      Full           Yes      Yes                                 +---------+---------------+---------+-----------+----------+--------------+ SFJ      Full                                                        +---------+---------------+---------+-----------+----------+--------------+ FV Prox  Full                                                        +---------+---------------+---------+-----------+----------+--------------+ FV Mid   Full                                                        +---------+---------------+---------+-----------+----------+--------------+ FV DistalFull                                                        +---------+---------------+---------+-----------+----------+--------------+ PFV      Full                                                        +---------+---------------+---------+-----------+----------+--------------+ POP      Full           Yes      Yes                                 +---------+---------------+---------+-----------+----------+--------------+ PTV      Full                                                        +---------+---------------+---------+-----------+----------+--------------+  PERO                                                  Not visualized +---------+---------------+---------+-----------+----------+--------------+    Summary: Right: No evidence of common femoral vein obstruction. Left: There is no evidence of deep vein thrombosis in the lower extremity. No cystic structure found in the popliteal fossa.  *See table(s) above for  measurements and observations. Electronically signed by Deitra Mayo MD on 12/21/2017 at 5:51:56 PM.    Final      Scheduled Meds: . apixaban  5 mg Oral Q12H  . buPROPion  450 mg Oral Daily  . carvedilol  12.5 mg Oral BID WC  . donepezil  10 mg Oral QHS  . gabapentin  300 mg Oral BID  . hydrALAZINE  50 mg Oral BID  . pravastatin  40 mg Oral QHS  . sertraline  12.5 mg Oral BID   Continuous Infusions: . vancomycin 500 mg (12/22/17 2320)     LOS: 2 days   Time Spent in minutes   30 minutes  Tenasia Aull D.O. on 12/23/2017 at 11:39 AM  Between 7am to 7pm - Please see pager noted on [email or web address]  After 7pm go to [email or web address]  And look for the night coverage person covering for me after hours  [facility] Office  [phone]

## 2017-12-24 DIAGNOSIS — M25473 Effusion, unspecified ankle: Secondary | ICD-10-CM

## 2017-12-24 LAB — BASIC METABOLIC PANEL
Anion gap: 10 (ref 5–15)
BUN: 21 mg/dL (ref 8–23)
CHLORIDE: 108 mmol/L (ref 98–111)
CO2: 20 mmol/L — ABNORMAL LOW (ref 22–32)
Calcium: 9.2 mg/dL (ref 8.9–10.3)
Creatinine, Ser: 1.33 mg/dL — ABNORMAL HIGH (ref 0.44–1.00)
GFR calc Af Amer: 44 mL/min — ABNORMAL LOW (ref >60)
GFR calc non Af Amer: 38 mL/min — ABNORMAL LOW (ref >60)
Glucose, Bld: 108 mg/dL — ABNORMAL HIGH (ref 70–99)
Potassium: 3.2 mmol/L — ABNORMAL LOW (ref 3.5–5.1)
SODIUM: 138 mmol/L (ref 135–145)

## 2017-12-24 LAB — CBC
HCT: 27.4 % — ABNORMAL LOW (ref 36.0–46.0)
Hemoglobin: 8.9 g/dL — ABNORMAL LOW (ref 12.0–15.0)
MCH: 30.4 pg (ref 26.0–34.0)
MCHC: 32.5 g/dL (ref 30.0–36.0)
MCV: 93.5 fL (ref 80.0–100.0)
Platelets: 186 10*3/uL (ref 150–400)
RBC: 2.93 MIL/uL — ABNORMAL LOW (ref 3.87–5.11)
RDW: 14.2 % (ref 11.5–15.5)
WBC: 8.8 10*3/uL (ref 4.0–10.5)
nRBC: 0 % (ref 0.0–0.2)

## 2017-12-24 MED ORDER — SACCHAROMYCES BOULARDII 250 MG PO CAPS
250.0000 mg | ORAL_CAPSULE | Freq: Two times a day (BID) | ORAL | Status: DC
  Administered 2017-12-24 – 2017-12-25 (×3): 250 mg via ORAL
  Filled 2017-12-24 (×3): qty 1

## 2017-12-24 MED ORDER — HYDROMORPHONE HCL 1 MG/ML IJ SOLN: 0.5000 mg | INTRAMUSCULAR | Status: DC | PRN

## 2017-12-24 MED ORDER — SODIUM CHLORIDE 0.9 % IV SOLN
INTRAVENOUS | Status: DC
  Administered 2017-12-24 – 2017-12-25 (×3): via INTRAVENOUS

## 2017-12-24 MED ORDER — VITAMIN B-12 1000 MCG PO TABS
2000.0000 ug | ORAL_TABLET | Freq: Every day | ORAL | Status: DC
  Administered 2017-12-24 – 2017-12-25 (×2): 2000 ug via ORAL
  Filled 2017-12-24 (×2): qty 2

## 2017-12-24 MED ORDER — CALCIUM CARBONATE-VITAMIN D 500-200 MG-UNIT PO TABS
1.0000 | ORAL_TABLET | Freq: Two times a day (BID) | ORAL | Status: DC
  Administered 2017-12-24 – 2017-12-25 (×3): 1 via ORAL
  Filled 2017-12-24 (×2): qty 1

## 2017-12-24 MED ORDER — TAB-A-VITE/IRON PO TABS
1.0000 | ORAL_TABLET | Freq: Every day | ORAL | Status: DC
  Administered 2017-12-24 – 2017-12-25 (×2): 1 via ORAL
  Filled 2017-12-24 (×3): qty 1

## 2017-12-24 MED ORDER — CALCIUM CITRATE-VITAMIN D 500-500 MG-UNIT PO CHEW
1.0000 | CHEWABLE_TABLET | Freq: Two times a day (BID) | ORAL | Status: DC
  Filled 2017-12-24: qty 1

## 2017-12-24 NOTE — Progress Notes (Signed)
PROGRESS NOTE    Susan [name]  NUU:[id number] DOB: 1940-08-20 DOA: 12/21/2017 PCP: Lauree Chandler, NP  Brief Narrative:77 y.o.femalewith medical history significant ofCVA with mild dysarthria and left hemiparesis, chronic diastolic CHF, persistent atrial fibrillation, mild dementia, anxiety, depression, osteoarthritis, sleep apnea with ongoing left knee issues being evaluated for total knee replacement was found down by family at her apartment. She was also found to be more confused than baseline,unable to ambulate when EMS arrived,subsequently brought to the emergency room  12/24/2017 patient seen and examined.  Patient reports her pain in the left leg is extremely severe that she is not able to move her feet or leg even passively.  When I try to touch her or move her leg she started howling in pain.  She has not walked since she has been admitted to the hospital.  Patient lives in an independent living facility.  She reports her pain as 20 out of 10.  She is very worried and anxious about her left foot.  She has been trying to get the work-up done for left knee replacement.  But she is able to move her knee without much pain. Assessment & Plan:   Principal Problem:   Sepsis ([place]) Active Problems:   Chronic diastolic (congestive) heart failure (HCC)   Persistent atrial fibrillation   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 (HCC)   History of CVA (cerebrovascular accident)  Severe sepsis secondary to left lower extremity cellulitis -Patient presented with fever, leukocytosis, tachypnea, altered mental status -UA, chest x-ray reviewed and unremarkable for infection -Lower extremity Doppler showed no evidence of right common femoral vein obstruction.  No evidence of left DVT. -Blood cultures show no growth to date -Patient started on vancomycin -cellulitis not improving- still with edema and erythema, warmth on exam-we will check CT of the left ankle and the  left leg. -Patient also had a fall prior to admission and has been complaining of left ankle pain. -She has a history of stroke with left-sided hemiplegia.  Acute metabolic encephalopathy -Suspect secondary to the above -Resolved -Patient appears to be at baseline, currently alert and oriented x3.  She is slow to respond, secondary to history of CVA. -supposedly patient has history of dementia and on aricept  Hip pain/Left ankle pain and edema secondary to Fall -X-rays of hips were unremarkable -Also had x-ray of the left knee, osseous demineralization with degenerative changes left knee.  No acute abnormalities. -X-ray left ankle: No acute or significant chronic bony abnormality of the left ankle.  Diffuse soft tissue swelling. -Elevate LLE -continue symptomatic treatment -PT/OT consulted  Chronic diastolic heart failure -Appears to be euvolemic and compensated at this time -Echocardiogram 04/28/2015 showed an EF 55 to 60%, no regional motion abnormalities (seems EF had improved when compared to prior echo of 35%) -Lasix currently held -Monitor intake and output, daily weights  Persistent atrial fibrillation -Currently in sinus rhythm -Continue Eliquis, Coreg  Essential hypertension -Lasix and losartan currently held -Continue Coreg, hydralazine History of CVA -With left-sided hemiplegia and mild dysarthria -CT head unremarkable for acute findings -Continue Eliquis  Mild hyperglycemia -Hemoglobin A1c  Chronic kidney disease, stage III -Creatinine 1.33 today, GFR still within stage III -Continue to monitor BMP    Estimated body mass index is 34.52 kg/m as calculated from the following:   Height as of this encounter: 5\' 2"  (1.575 m).   Weight as of this encounter: 85.6 kg.  DVT prophylaxis: Eliquis Code Status: Full code Family  Communication: No family available Disposition Plan: Pending clinical improvement of left lower extremity cellulitis rule out stress  fracture  Consultants: None  Procedures: None Antimicrobials: Vancomycin  Subjective: Complains of 20 out of 10 pain in the left ankle not able to move her left ankle and leg due to pain sensation intact no urinary or bladder or bowel complaints reports having not walked since admission.  Objective: Vitals:   12/23/17 1645 12/23/17 2030 12/24/17 0549 12/24/17 0952  BP: 103/63 (!) 141/61 (!) 155/80 140/68  Pulse: 87 84 69 72  Resp: 18 20 18  (!) 24  Temp:  99.9 F (37.7 C) 99.5 F (37.5 C) 99.6 F (37.6 C)  TempSrc:  Oral Oral Oral  SpO2: 100% 95% 96% 97%  Weight:      Height:        Intake/Output Summary (Last 24 hours) at 12/24/2017 1136 Last data filed at 12/24/2017 0545 Gross per 24 hour  Intake 520 ml  Output 800 ml  Net -280 ml   Filed Weights   12/21/17 1507  Weight: 85.6 kg    Examination:  General exam: Appears calm and comfortable  Respiratory system: Clear to auscultation. Respiratory effort normal. Cardiovascular system: S1 & S2 heard, RRR. No JVD, murmurs, rubs, gallops or clicks. No pedal edema. Gastrointestinal system: Abdomen is nondistended, soft and nontender. No organomegaly or masses felt. Normal bowel sounds heard. Central nervous system: Alert and oriented. No focal neurological deficits. Extremities: Left ankle swollen tender decreased range of motion left lower extremity erythema edema tender to touch Skin: No rashes, lesions or ulcers Psychiatry: Judgement and insight appear normal. Mood & affect appropriate.     Data Reviewed: I have personally reviewed following labs and imaging studies  CBC: Recent Labs  Lab 12/21/17 1527 12/22/17 0442 12/23/17 0355 12/24/17 0541  WBC 19.5* 16.5* 13.8* 8.8  NEUTROABS 17.9*  --   --   --   HGB 10.1* 9.5* 9.5* 8.9*  HCT 31.8* 30.5* 29.5* 27.4*  MCV 94.9 94.7 95.5 93.5  PLT 208 189 169 956   Basic Metabolic Panel: Recent Labs  Lab 12/21/17 1527 12/22/17 0442 12/23/17 0355 12/24/17 0541    NA 140 140 139 138  K 3.9 3.5 3.6 3.2*  CL 105 108 109 108  CO2 22 21* 19* 20*  GLUCOSE 130* 117* 103* 108*  BUN 23 20 24* 21  CREATININE 1.49* 1.49* 1.66* 1.33*  CALCIUM 9.9 9.3 9.3 9.2   GFR: Estimated Creatinine Clearance: 34.8 mL/min (A) (by C-G formula based on SCr of 1.33 mg/dL (H)). Liver Function Tests: Recent Labs  Lab 12/21/17 1527  AST 48*  ALT 29  ALKPHOS 50  BILITOT 0.6  PROT 6.9  ALBUMIN 3.6   No results for input(s): LIPASE, AMYLASE in the last 168 hours. No results for input(s): AMMONIA in the last 168 hours. Coagulation Profile: No results for input(s): INR, PROTIME in the last 168 hours. Cardiac Enzymes: No results for input(s): CKTOTAL, CKMB, CKMBINDEX, TROPONINI in the last 168 hours. BNP (last 3 results) No results for input(s): PROBNP in the last 8760 hours. HbA1C: Recent Labs    12/21/17 1527  HGBA1C 5.5   CBG: No results for input(s): GLUCAP in the last 168 hours. Lipid Profile: No results for input(s): CHOL, HDL, LDLCALC, TRIG, CHOLHDL, LDLDIRECT in the last 72 hours. Thyroid Function Tests: No results for input(s): TSH, T4TOTAL, FREET4, T3FREE, THYROIDAB in the last 72 hours. Anemia Panel: No results for input(s): VITAMINB12, FOLATE, FERRITIN, TIBC, IRON,  RETICCTPCT in the last 72 hours. Sepsis Labs: Recent Labs  Lab 12/21/17 1544 12/21/17 1757  LATICACIDVEN 2.72* 1.9    Recent Results (from the past 240 hour(s))  Culture, blood (Routine X 2) w Reflex to ID Panel     Status: None (Preliminary result)   Collection Time: 12/21/17  3:27 PM  Result Value Ref Range Status   Specimen Description BLOOD BLOOD LEFT ARM  Final   Special Requests   Final    BOTTLES DRAWN AEROBIC AND ANAEROBIC Blood Culture adequate volume   Culture   Final    NO GROWTH 3 DAYS Performed at [facility], [place] [address] [place], [place] [address]    Report Status PENDING  Incomplete  Culture, blood (Routine X 2) w Reflex to ID Panel     Status: None  (Preliminary result)   Collection Time: 12/21/17  3:29 PM  Result Value Ref Range Status   Specimen Description BLOOD LEFT ANTECUBITAL  Final   Special Requests   Final    BOTTLES DRAWN AEROBIC AND ANAEROBIC Blood Culture adequate volume   Culture   Final    NO GROWTH 3 DAYS Performed at [facility], [place] [address], [place], [place] [address]    Report Status PENDING  Incomplete         Radiology Studies: No results found.      Scheduled Meds: . apixaban  5 mg Oral Q12H  . buPROPion  450 mg Oral Daily  . carvedilol  12.5 mg Oral BID WC  . donepezil  10 mg Oral QHS  . gabapentin  300 mg Oral BID  . hydrALAZINE  50 mg Oral BID  . pravastatin  40 mg Oral QHS  . sertraline  12.5 mg Oral BID   Continuous Infusions: . vancomycin 500 mg (12/23/17 2149)     LOS: 3 days    Georgette Shell, MD [facility] If 7PM-7AM, please contact night-coverage [email or web address] Password [facility] 12/24/2017, 11:36 AM

## 2017-12-24 NOTE — Consult Note (Signed)
   [facility] CM Inpatient Consult   12/24/2017  Susan [name] 1940/10/11 [id number]   [facility] Care Management hospital liaison follow up. Spoke with inpatient RNCM. Discharge plan is for SNF. Requested that [facility] Management consult be placed should disposition plans change.   Marthenia Rolling, MSN-Ed, RN,BSN [facility] Liaison [phone]

## 2017-12-24 NOTE — Evaluation (Signed)
Occupational Therapy Evaluation Patient Details Name: Susan [name] MRN: [id number] DOB: August 22, 1940 Today's Date: 12/24/2017    History of Present Illness Pt is a 77 y.o. female admitted after a fall, found to have sepsis secondary to L LE cellulitis. CT: no acute abnormalities. Xray of hips and L ankle unremarkable. PMH Dementia, L sided hemiparesis and dysarthria from CVA in 2017, CHF, afib on Eliquis, HLD, incontinence, ongoing L knee issues (has been evaluated for TKA).   Clinical Impression   Pt with decline in function and safety with ADLs and ADL mobility with decreased strength, balance and endurance. Pt with hx of cognitive impairments and demos edematous L LE. Pt limited by pain in L LE and required increased time  and effort due to L LE pain. Required building up momentum and 3 trials before complete sit - stand for [facility] and recliner transfers. Pt requires extensive assist with ADLs. Pt would benefit from acute OT services to address impairments to maximize level of function and safety    Follow Up Recommendations  SNF;Supervision/Assistance - 24 hour    Equipment Recommendations  Other (comment)(TBD at next venue of care)    Recommendations for Other Services       Precautions / Restrictions Precautions Precautions: Fall Restrictions Weight Bearing Restrictions: No      Mobility Bed Mobility Overal bed mobility: Needs Assistance Bed Mobility: Supine to Sit     Supine to sit: Max assist;Mod assist     General bed mobility comments: Pt required increased time  and effort to get to EOB due to L LE pain  Transfers Overall transfer level: Needs assistance Equipment used: Rolling walker (2 wheeled) Transfers: Sit to/from [facility] Sit to Stand: Max assist Stand pivot transfers: Max assist       General transfer comment: Pt required increased time  and effort due to L LE pain. Required building up momentum and 3 trials before complete sit -  stand    Balance Overall balance assessment: Needs assistance Sitting-balance support: No upper extremity supported;Feet supported Sitting balance-Leahy Scale: Fair     Standing balance support: During functional activity;Bilateral upper extremity supported Standing balance-Leahy Scale: Poor                             ADL either performed or assessed with clinical judgement   ADL Overall ADL's : Needs assistance/impaired Eating/Feeding: Independent;Sitting   Grooming: Wash/dry hands;Wash/dry face;Set up;Supervision/safety   Upper Body Bathing: Min guard;Sitting   Lower Body Bathing: Total assistance   Upper Body Dressing : Min guard;Sitting   Lower Body Dressing: Total assistance   Toilet Transfer: Moderate assistance;Stand-pivot;BSC;RW;Cueing for safety;Cueing for sequencing   Toileting- Clothing Manipulation and Hygiene: Total assistance       Functional mobility during ADLs: Maximal assistance;Cueing for safety;Cueing for sequencing;Rolling walker General ADL Comments: increased time and effort required due to  L LE pain     Vision Baseline Vision/History: Wears glasses Wears Glasses: Reading only Patient Visual Report: No change from baseline       Perception     Praxis      Pertinent Vitals/Pain Pain Assessment: 0-10 Pain Score: 8  Pain Location: L ankle > L hip Pain Descriptors / Indicators: Shooting;Throbbing;Burning;Sore;Tightness Pain Intervention(s): Limited activity within patient's tolerance;Monitored during session;Premedicated before session;Repositioned     Hand Dominance Right   Extremity/Trunk Assessment Upper Extremity Assessment Upper Extremity Assessment: Generalized weakness   Lower Extremity Assessment Lower Extremity  Assessment: Defer to PT evaluation       Communication     Cognition Arousal/Alertness: Awake/alert Behavior During Therapy: WFL for tasks assessed/performed Overall Cognitive Status: History of  cognitive impairments - at baseline Area of Impairment: Memory;Attention;Awareness;Following commands                     Memory: Decreased short-term memory Following Commands: Follows one step commands inconsistently           General Comments       Exercises     Shoulder Instructions      Home Living Family/patient expects to be discharged to:: Skilled nursing facility                                        Prior Functioning/Environment Level of Independence: Needs assistance  Gait / Transfers Assistance Needed: uses RW ADL's / Homemaking Assistance Needed: Pt states she is independent with ADLs, bathing, cooking Communication / Swallowing Assistance Needed: dysarthria from previous CVA          OT Problem List: Decreased strength;Decreased activity tolerance;Decreased cognition;Decreased knowledge of use of DME or AE;Increased edema;Impaired balance (sitting and/or standing);Decreased coordination;Decreased knowledge of precautions;Pain      OT Treatment/Interventions: Self-care/ADL training;Therapeutic exercise;DME and/or AE instruction;Therapeutic activities;Patient/family education    OT Goals(Current goals can be found in the care plan section) Acute Rehab OT Goals Patient Stated Goal: decreased pain in L leg OT Goal Formulation: With patient Time For Goal Achievement: 01/07/18 Potential to Achieve Goals: Good ADL Goals Pt Will Perform Grooming: with set-up;sitting Pt Will Perform Upper Body Bathing: with supervision;with set-up;sitting Pt Will Perform Lower Body Bathing: with max assist;with mod assist;sitting/lateral leans Pt Will Perform Upper Body Dressing: with supervision;with set-up;sitting Pt Will Transfer to Toilet: with mod assist;stand pivot transfer;bedside commode Additional ADL Goal #1: Pt will complete bed mobiliyt with min A to sit EOB in prep for ADLs  OT Frequency: Min 2X/week   Barriers to D/C: Decreased caregiver  support          Co-evaluation              AM-PAC OT "6 Clicks" Daily Activity     Outcome Measure Help from another person eating meals?: None Help from another person taking care of personal grooming?: A Little Help from another person toileting, which includes using toliet, bedpan, or urinal?: Total Help from another person bathing (including washing, rinsing, drying)?: A Lot Help from another person to put on and taking off regular upper body clothing?: A Little Help from another person to put on and taking off regular lower body clothing?: Total 6 Click Score: 14   End of Session Equipment Utilized During Treatment: Gait belt;Other (comment);Rolling walker(BSC) Nurse Communication: Mobility status  Activity Tolerance: Patient limited by pain Patient left: in chair;with call bell/phone within reach;with chair alarm set  OT Visit Diagnosis: Unsteadiness on feet (R26.81);Other abnormalities of gait and mobility (R26.89);Muscle weakness (generalized) (M62.81);History of falling (Z91.81)                Time: 6073-7106 OT Time Calculation (min): 41 min Charges:  OT General Charges $OT Visit: 1 Visit OT Evaluation $OT Eval Moderate Complexity: 1 Mod OT Treatments $Self Care/Home Management : 8-22 mins $Therapeutic Activity: 8-22 mins    Britt Bottom 12/24/2017, 1:37 PM

## 2017-12-24 NOTE — Progress Notes (Signed)
Pharmacy Antibiotic Note  Susan [name] is a 77 y.o. female admitted on 12/21/2017 with cellulitis.  Pharmacy has been consulted for Vancomycin dosing.  Vancomycin 500 mg IV Q 24 hrs. Goal AUC 400-550. Expected AUC: 467 SCr used: 1.33  The patient's renal function has fluctuated slightly (SCr 1.49 > 1.66 > 1.33) however current Vancomycin dosing remains appropriate. WBC normalized, fevers improving.  Plan: - Continue Vancomycin 500 mg IV every 24 hours - Will continue to follow renal function, culture results, LOT, and antibiotic de-escalation plans   Height: 5\' 2"  (157.5 cm) Weight: 188 lb 11.4 oz (85.6 kg) IBW/kg (Calculated) : 50.1  Temp (24hrs), Avg:99.4 F (37.4 C), Min:98.4 F (36.9 C), Max:99.9 F (37.7 C)  Recent Labs  Lab 12/21/17 1527 12/21/17 1544 12/21/17 1757 12/22/17 0442 12/23/17 0355 12/24/17 0541  WBC 19.5*  --   --  16.5* 13.8* 8.8  CREATININE 1.49*  --   --  1.49* 1.66* 1.33*  LATICACIDVEN  --  2.72* 1.9  --   --   --     Estimated Creatinine Clearance: 34.8 mL/min (A) (by C-G formula based on SCr of 1.33 mg/dL (H)).    Allergies  Allergen Reactions  . Ace Inhibitors Cough  . Lipitor [Atorvastatin] Swelling  . Simvastatin Other (See Comments)    Myalgias     . Latex Itching    Vancomycin 12/15 >>  12/15 BCx >> 12/16 UCx >>  Thank you for allowing pharmacy to be a part of this patient's care.  Alycia Rossetti, PharmD, BCPS Clinical Pharmacist Pager: [phone] Clinical phone for 12/24/2017 from 7a-3:30p: [phone] If after 3:30p, please call main pharmacy at: [phone] Please check AMION for all [place] numbers 12/24/2017 11:22 AM

## 2017-12-24 NOTE — Care Management Important Message (Signed)
Important Message  Patient Details  Name: Susan [name] MRN: [id number] Date of Birth: Jan 29, 1940   Medicare Important Message Given:  Yes    Johnathan Heskett [address] 12/24/2017, 4:01 PM

## 2017-12-24 NOTE — Care Management Note (Signed)
Case Management Note  Patient Details  Name: Susan [name] MRN: [id number] Date of Birth: 1940/11/27  Subjective/Objective:             Fall at home-L knee cellulitis       Action/Plan: PTA lives at [facility] independent living. She states she fell while trying to get into bed, and was on the floor for about an hour before her daughter came to check on her. She states her daughter has a key to her apartment. Pt states she has 3 daughters who look after her. Has rollator, shower chair, towel for floor, and grab bars. Pt attends group exercise class at [place] twice a week[facility] Pt no longer drives since her stroke, and uses [employer] for transportation. Pt states that No HH services. Gets medications through [facility]. Spoke with Tommi Rumps about the program. This is a med Personal assistant where medications are sorted into blister packs and delivered to patient monthly. Patient will need printed prescriptions for any new medications when ready to transition home. Reports sometimes forgetting to take medications on time. Discussed setting recurrent alarms and back up alarms on phone. Patient was agreeable to this suggestion. Pt's daughter, Sharee Pimple, called while CM in the room and patient requested to put [place] on speaker phone. Pt provided permission for case manager to speak with Sharee Pimple about her care. Pt has PCP at [facility], but states Dr. Eulas Post is leaving; however, there is a search for new provider. Pt agreeable to short term rehab as recommended by PT. Pt expressed concerns about wanting to go to [facility] 12/23 as planned. Daughter encouraged pt to follow through with health recommendation stating travel and holiday plans can be modified as needed. Will continue to follow for transition of care needs.   Expected Discharge Date:  12/30/17               Expected Discharge Plan:  Skilled Nursing Facility  In-House Referral:  Clinical Social Work  Discharge planning Services  CM  Consult  Post Acute Care Choice:    Choice offered to:     DME Arranged:    DME Agency:     HH Arranged:    [place] Agency:     Status of Service:  In process, will continue to follow  If discussed at Long Length of Stay Meetings, dates discussed:    Additional Comments:  Bartholomew Crews, RN 12/24/2017, 10:48 AM

## 2017-12-25 ENCOUNTER — Inpatient Hospital Stay (HOSPITAL_COMMUNITY): Payer: PPO

## 2017-12-25 DIAGNOSIS — M25473 Effusion, unspecified ankle: Secondary | ICD-10-CM | POA: Diagnosis not present

## 2017-12-25 DIAGNOSIS — R609 Edema, unspecified: Secondary | ICD-10-CM | POA: Diagnosis not present

## 2017-12-25 DIAGNOSIS — I110 Hypertensive heart disease with heart failure: Secondary | ICD-10-CM | POA: Diagnosis not present

## 2017-12-25 DIAGNOSIS — R0902 Hypoxemia: Secondary | ICD-10-CM | POA: Diagnosis not present

## 2017-12-25 DIAGNOSIS — R2681 Unsteadiness on feet: Secondary | ICD-10-CM | POA: Diagnosis not present

## 2017-12-25 DIAGNOSIS — I4819 Other persistent atrial fibrillation: Secondary | ICD-10-CM | POA: Diagnosis not present

## 2017-12-25 DIAGNOSIS — A419 Sepsis, unspecified organism: Secondary | ICD-10-CM | POA: Diagnosis not present

## 2017-12-25 DIAGNOSIS — R531 Weakness: Secondary | ICD-10-CM | POA: Diagnosis not present

## 2017-12-25 DIAGNOSIS — G4733 Obstructive sleep apnea (adult) (pediatric): Secondary | ICD-10-CM | POA: Diagnosis not present

## 2017-12-25 DIAGNOSIS — I5032 Chronic diastolic (congestive) heart failure: Secondary | ICD-10-CM | POA: Diagnosis not present

## 2017-12-25 DIAGNOSIS — K219 Gastro-esophageal reflux disease without esophagitis: Secondary | ICD-10-CM | POA: Diagnosis not present

## 2017-12-25 DIAGNOSIS — F0150 Vascular dementia without behavioral disturbance: Secondary | ICD-10-CM | POA: Diagnosis not present

## 2017-12-25 DIAGNOSIS — M6281 Muscle weakness (generalized): Secondary | ICD-10-CM | POA: Diagnosis not present

## 2017-12-25 DIAGNOSIS — R509 Fever, unspecified: Secondary | ICD-10-CM | POA: Diagnosis not present

## 2017-12-25 DIAGNOSIS — M4800 Spinal stenosis, site unspecified: Secondary | ICD-10-CM | POA: Diagnosis not present

## 2017-12-25 DIAGNOSIS — F419 Anxiety disorder, unspecified: Secondary | ICD-10-CM | POA: Diagnosis not present

## 2017-12-25 DIAGNOSIS — K429 Umbilical hernia without obstruction or gangrene: Secondary | ICD-10-CM | POA: Diagnosis not present

## 2017-12-25 DIAGNOSIS — I69322 Dysarthria following cerebral infarction: Secondary | ICD-10-CM | POA: Diagnosis not present

## 2017-12-25 DIAGNOSIS — I10 Essential (primary) hypertension: Secondary | ICD-10-CM | POA: Diagnosis not present

## 2017-12-25 DIAGNOSIS — R32 Unspecified urinary incontinence: Secondary | ICD-10-CM | POA: Diagnosis not present

## 2017-12-25 DIAGNOSIS — D631 Anemia in chronic kidney disease: Secondary | ICD-10-CM | POA: Diagnosis not present

## 2017-12-25 DIAGNOSIS — I69359 Hemiplegia and hemiparesis following cerebral infarction affecting unspecified side: Secondary | ICD-10-CM | POA: Diagnosis not present

## 2017-12-25 DIAGNOSIS — R6520 Severe sepsis without septic shock: Secondary | ICD-10-CM | POA: Diagnosis not present

## 2017-12-25 DIAGNOSIS — R2689 Other abnormalities of gait and mobility: Secondary | ICD-10-CM | POA: Diagnosis not present

## 2017-12-25 DIAGNOSIS — R52 Pain, unspecified: Secondary | ICD-10-CM | POA: Diagnosis not present

## 2017-12-25 DIAGNOSIS — Z743 Need for continuous supervision: Secondary | ICD-10-CM | POA: Diagnosis not present

## 2017-12-25 DIAGNOSIS — L02416 Cutaneous abscess of left lower limb: Secondary | ICD-10-CM | POA: Diagnosis not present

## 2017-12-25 DIAGNOSIS — L03116 Cellulitis of left lower limb: Secondary | ICD-10-CM

## 2017-12-25 DIAGNOSIS — M79662 Pain in left lower leg: Secondary | ICD-10-CM | POA: Diagnosis not present

## 2017-12-25 DIAGNOSIS — R41841 Cognitive communication deficit: Secondary | ICD-10-CM | POA: Diagnosis not present

## 2017-12-25 DIAGNOSIS — E210 Primary hyperparathyroidism: Secondary | ICD-10-CM | POA: Diagnosis not present

## 2017-12-25 DIAGNOSIS — G8929 Other chronic pain: Secondary | ICD-10-CM | POA: Diagnosis not present

## 2017-12-25 DIAGNOSIS — M545 Low back pain: Secondary | ICD-10-CM | POA: Diagnosis not present

## 2017-12-25 DIAGNOSIS — I130 Hypertensive heart and chronic kidney disease with heart failure and stage 1 through stage 4 chronic kidney disease, or unspecified chronic kidney disease: Secondary | ICD-10-CM | POA: Diagnosis not present

## 2017-12-25 DIAGNOSIS — D649 Anemia, unspecified: Secondary | ICD-10-CM | POA: Diagnosis not present

## 2017-12-25 DIAGNOSIS — N183 Chronic kidney disease, stage 3 (moderate): Secondary | ICD-10-CM | POA: Diagnosis not present

## 2017-12-25 DIAGNOSIS — M7989 Other specified soft tissue disorders: Secondary | ICD-10-CM | POA: Diagnosis not present

## 2017-12-25 DIAGNOSIS — L03119 Cellulitis of unspecified part of limb: Secondary | ICD-10-CM | POA: Diagnosis not present

## 2017-12-25 DIAGNOSIS — I420 Dilated cardiomyopathy: Secondary | ICD-10-CM | POA: Diagnosis not present

## 2017-12-25 DIAGNOSIS — L02419 Cutaneous abscess of limb, unspecified: Secondary | ICD-10-CM | POA: Diagnosis not present

## 2017-12-25 DIAGNOSIS — R0989 Other specified symptoms and signs involving the circulatory and respiratory systems: Secondary | ICD-10-CM | POA: Diagnosis not present

## 2017-12-25 DIAGNOSIS — F332 Major depressive disorder, recurrent severe without psychotic features: Secondary | ICD-10-CM | POA: Diagnosis not present

## 2017-12-25 DIAGNOSIS — G459 Transient cerebral ischemic attack, unspecified: Secondary | ICD-10-CM | POA: Diagnosis not present

## 2017-12-25 DIAGNOSIS — R4701 Aphasia: Secondary | ICD-10-CM | POA: Diagnosis not present

## 2017-12-25 DIAGNOSIS — E892 Postprocedural hypoparathyroidism: Secondary | ICD-10-CM | POA: Diagnosis not present

## 2017-12-25 DIAGNOSIS — L0390 Cellulitis, unspecified: Secondary | ICD-10-CM | POA: Diagnosis not present

## 2017-12-25 DIAGNOSIS — N170 Acute kidney failure with tubular necrosis: Secondary | ICD-10-CM | POA: Diagnosis not present

## 2017-12-25 DIAGNOSIS — E785 Hyperlipidemia, unspecified: Secondary | ICD-10-CM | POA: Diagnosis not present

## 2017-12-25 DIAGNOSIS — R600 Localized edema: Secondary | ICD-10-CM | POA: Diagnosis not present

## 2017-12-25 DIAGNOSIS — R05 Cough: Secondary | ICD-10-CM | POA: Diagnosis not present

## 2017-12-25 DIAGNOSIS — R279 Unspecified lack of coordination: Secondary | ICD-10-CM | POA: Diagnosis not present

## 2017-12-25 DIAGNOSIS — S81802A Unspecified open wound, left lower leg, initial encounter: Secondary | ICD-10-CM | POA: Diagnosis not present

## 2017-12-25 LAB — CBC
HCT: 25.4 % — ABNORMAL LOW (ref 36.0–46.0)
Hemoglobin: 8.3 g/dL — ABNORMAL LOW (ref 12.0–15.0)
MCH: 30.3 pg (ref 26.0–34.0)
MCHC: 32.7 g/dL (ref 30.0–36.0)
MCV: 92.7 fL (ref 80.0–100.0)
Platelets: 193 10*3/uL (ref 150–400)
RBC: 2.74 MIL/uL — ABNORMAL LOW (ref 3.87–5.11)
RDW: 14.1 % (ref 11.5–15.5)
WBC: 6.9 10*3/uL (ref 4.0–10.5)
nRBC: 0 % (ref 0.0–0.2)

## 2017-12-25 LAB — BASIC METABOLIC PANEL
Anion gap: 6 (ref 5–15)
BUN: 14 mg/dL (ref 8–23)
CO2: 22 mmol/L (ref 22–32)
CREATININE: 1.11 mg/dL — AB (ref 0.44–1.00)
Calcium: 9.1 mg/dL (ref 8.9–10.3)
Chloride: 110 mmol/L (ref 98–111)
GFR calc Af Amer: 55 mL/min — ABNORMAL LOW (ref >60)
GFR calc non Af Amer: 47 mL/min — ABNORMAL LOW (ref >60)
Glucose, Bld: 119 mg/dL — ABNORMAL HIGH (ref 70–99)
Potassium: 3.2 mmol/L — ABNORMAL LOW (ref 3.5–5.1)
Sodium: 138 mmol/L (ref 135–145)

## 2017-12-25 MED ORDER — SACCHAROMYCES BOULARDII 250 MG PO CAPS: 250.0000 mg | ORAL_CAPSULE | Freq: Two times a day (BID) | ORAL | Status: DC

## 2017-12-25 MED ORDER — AMOXICILLIN-POT CLAVULANATE 875-125 MG PO TABS: 1.0000 | ORAL_TABLET | Freq: Two times a day (BID) | ORAL | 0 refills | Status: DC

## 2017-12-25 MED ORDER — TRAMADOL HCL 50 MG PO TABS: 50.0000 mg | ORAL_TABLET | Freq: Four times a day (QID) | ORAL | 0 refills | Status: DC | PRN

## 2017-12-25 NOTE — Discharge Summary (Signed)
Physician Discharge Summary  Susan [name] NOM:[id number] DOB: 1940/05/18 DOA: 12/21/2017  PCP: Lauree Chandler, NP  Admit date: 12/21/2017 Discharge date: 12/25/2017  Admitted From: Home Disposition: Skilled nursing facility none Recommendations for Outpatient Follow-up:  1. Follow up with PCP in 1-2 weeks 2. 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 She was also found to be more confused than baseline,unable to ambulate when EMS arrived,subsequently brought to the emergency room  12/24/2017 patient seen and examined.  Patient reports her pain in the left leg is extremely severe that she is not able to move her feet or leg even passively.  When I try to touch her or move her leg she started howling in pain.  She has not walked since she has been admitted to the hospital.  Patient lives in an independent living facility.  She reports her pain as 20 out of 10.  She is very worried and anxious about her left foot.  She has been trying to get the work-up done for left knee replacement.  But she is able to move her knee without much pain.  Discharge Diagnoses:  Principal Problem:   Sepsis ([place]) Active Problems:   Chronic diastolic (congestive) heart failure (HCC)   Persistent atrial fibrillation   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 (HCC)   History of CVA (cerebrovascular accident)   Ankle edema  Severe sepsis secondary to left lower extremity cellulitis -Patient presented with fever,  leukocytosis, tachypnea, altered mental status -UA, chest x-ray reviewed and unremarkable for infection -Lower extremity Doppler showed no evidence of right common femoral vein obstruction. No evidence of left DVT. -Blood cultures show no growth to date -Patient started on vancomycin -cellulitis  improving-she has no fever no leukocytosis.  CT scan of the left ankle showed no fracture but evidence of cellulitis noted.  Advised to keep her feet elevated while in bed.  She was seen by physical therapy who recommended SNF placement.-She has a history of stroke with left-sided hemiplegia.  Acute metabolic encephalopathy -Suspect secondary to the above -Resolved -Patient appears to be at baseline, currently alert and oriented x3. She is slow to respond,secondary to history of CVA. -supposedly patient has history of dementia     Hip pain/Left ankle pain and edema secondary to Fall -X-rays of hips were unremarkable -Also had x-ray of the left knee, osseous demineralization with degenerative changes left knee. No acute abnormalities. -X-rayleft ankle: No acute or significant chronic bony abnormality of the left ankle. Diffuse soft tissue swelling. -Elevate LLE -continue symptomatic treatment -PT/OT consulted -CT scan of the left ankle no evidence of fracture  Chronic diastolic heart failure -Appears to be euvolemic and compensated at this time -Echocardiogram 04/28/2015 showed an EF 55 to 60%, no regional motion abnormalities (seems EF had improved when compared to prior echo of 35%) -Continue Lasix -Monitor intake and output, daily weights  Persistent atrial fibrillation -Currently in sinus rhythm -Continue Eliquis, Coreg  Essential hypertension continue Lasix Coreg and losartan.  I have stopped her hydralazine for the time being since her blood pressure is not too high.  Please consider restarting this as  an outpatient if her blood pressure remains high or goes  higher.   Estimated body mass index is 34.52 kg/m as calculated from the following:   Height as of this encounter: 5\' 2"  (1.575 m).   Weight as of this encounter: 85.6 kg. Chronic kidney disease, stage III -Creatinine1.33 today, GFR still within stage III -Continue to monitor BMP  Discharge Instructions   Allergies as of 12/25/2017      Reactions   Ace Inhibitors Cough   Lipitor [atorvastatin] Swelling   Simvastatin Other (See Comments)   Myalgias    Latex Itching      Medication List    STOP taking these medications   hydrALAZINE 50 MG tablet Commonly known as:  APRESOLINE     TAKE these medications   acetaminophen 500 MG tablet Commonly known as:  TYLENOL Take 2 tablets (1,000 mg total) by mouth every 6 (six) hours as needed for moderate pain. Take 1000 mg tablet twice daily in the morning and evening and take 1 tablet ( 500 mg) by mouth daily at 2 pm. What changed:  when to take this   amoxicillin-clavulanate 875-125 MG tablet Commonly known as:  AUGMENTIN Take 1 tablet by mouth 2 (two) times daily for 7 days.   apixaban 5 MG Tabs tablet Commonly known as:  ELIQUIS Take 1 tablet (5 mg total) by mouth every 12 (twelve) hours.   buPROPion 150 MG 24 hr tablet Commonly known as:  WELLBUTRIN XL Take 450 mg by mouth daily.   Calcium Carbonate-Vitamin D 600-400 MG-UNIT tablet Take 1 tablet by mouth 2 (two) times daily.   carvedilol 12.5 MG tablet Commonly known as:  COREG Take 12.5 mg by mouth 2 (two) times daily with a meal.   cholecalciferol 25 MCG (1000 UT) tablet Commonly known as:  VITAMIN D Take 1,000 Units by mouth daily.   diclofenac sodium 1 % Gel Commonly known as:  VOLTAREN Apply 2 g topically 4 (four) times daily.   docusate sodium 100 MG capsule Commonly known as:  COLACE Take 100 mg by mouth daily.   donepezil 10 MG tablet Commonly known as:  ARICEPT Take 1 tablet (10 mg total) by mouth at bedtime. For memory loss   furosemide 20 MG  tablet Commonly known as:  LASIX Take 20 mg by mouth daily.   gabapentin 300 MG capsule Commonly known as:  NEURONTIN Take 300 mg by mouth 2 (two) times daily.   IFEREX 150 150 MG capsule Generic drug:  iron polysaccharides Take 150 mg by mouth daily.   losartan 50 MG tablet Commonly known as:  COZAAR Take 1 tablet (50 mg total) by mouth daily.   Menthol (Topical Analgesic) 4 % Gel Commonly known as:  BIOFREEZE Apply 3 oz topically 3 (three) times daily. For knee pain   multivitamin with minerals Tabs tablet Take 1 tablet by mouth daily.   pantoprazole 40 MG tablet Commonly known as:  PROTONIX Take 40 mg by mouth daily.   pravastatin 40 MG tablet Commonly known as:  PRAVACHOL Take 40 mg by mouth every evening.   saccharomyces boulardii 250 MG capsule Commonly known as:  FLORASTOR Take 1 capsule (250 mg total) by mouth 2 (two) times daily.   sertraline 25 MG tablet Commonly known as:  ZOLOFT Take 0.5 tablets (12.5 mg total) by mouth 2 (two) times daily.   traMADol 50 MG tablet Commonly known as:  ULTRAM Take 1 tablet (50 mg total) by mouth every 6 (six) hours as  needed.   vitamin B-12 1000 MCG tablet Commonly known as:  CYANOCOBALAMIN Take 2,000 mcg by mouth daily.       Allergies  Allergen Reactions  . Ace Inhibitors Cough  . Lipitor [Atorvastatin] Swelling  . Simvastatin Other (See Comments)    Myalgias     . Latex Itching    Consultations:  None   Procedures/Studies: Dg Chest 2 View  Result Date: 12/21/2017 CLINICAL DATA:  Fell at home, UTI EXAM: CHEST - 2 VIEW COMPARISON:  11/21/2015 FINDINGS: Enlargement of cardiac silhouette with pulmonary vascular congestion. Atherosclerotic calcification aorta. Interstitial prominence in both lungs increased since previous exam question minimal failure. No segmental consolidation, pleural effusion or pneumothorax. Bones diffusely demineralized. IMPRESSION: Enlargement of cardiac silhouette with pulmonary  vascular congestion and question minimal failure. Electronically Signed   By: Lavonia Dana M.D.   On: 12/21/2017 16:52   Dg Ankle Complete Left  Result Date: 12/22/2017 CLINICAL DATA:  Recent fall at home with swelling and tenderness of the left ankle with difficulty walking. EXAM: LEFT ANKLE COMPLETE - 3+ VIEW COMPARISON:  Left ankle series dated April 13, 2017 FINDINGS: The bones are subjectively adequately mineralized. The ankle joint mortise is preserved. The talar dome is intact. There is no acute malleolar fracture. The talus and calcaneus are intact. There are plantar and Achilles region calcaneal spurs. The observed portions of the more distal tarsal bones appear normal. There are soft tissue calcifications most compatible with phleboliths. There is diffuse soft tissue calcification. IMPRESSION: There is no acute or significant chronic bony abnormality of the left ankle. There is diffuse soft tissue swelling. Electronically Signed   By: David  Martinique M.D.   On: 12/22/2017 10:34   Ct Head Wo Contrast  Result Date: 12/21/2017 CLINICAL DATA:  Unwitnessed fall EXAM: CT HEAD WITHOUT CONTRAST CT CERVICAL SPINE WITHOUT CONTRAST TECHNIQUE: Multidetector CT imaging of the head and cervical spine was performed following the standard protocol without intravenous contrast. Multiplanar CT image reconstructions of the cervical spine were also generated. COMPARISON:  Head CT 01/25/2015 FINDINGS: CT HEAD FINDINGS Brain: There is no mass, hemorrhage or extra-axial collection. There is generalized atrophy without lobar predilection. There is hypoattenuation of the periventricular white matter, most commonly indicating chronic ischemic microangiopathy. Vascular: No abnormal hyperdensity of the major intracranial arteries or dural venous sinuses. No intracranial atherosclerosis. Skull: The visualized skull base, calvarium and extracranial soft tissues are normal. Sinuses/Orbits: Moderate paranasal sinus opacification.  No fluid levels. The orbits are normal. CT CERVICAL SPINE FINDINGS Alignment: Reversal of normal cervical lordosis. Grade 1 C2-3 anterolisthesis. Facet alignment is normal. Occipital condyles are aligned with the lateral masses of C1 and C2. Skull base and vertebrae: No acute fracture. Soft tissues and spinal canal: No prevertebral fluid or swelling. No visible canal hematoma. Disc levels: There is multilevel severe disc space narrowing, predominantly in the upper cervical spine. Uncovertebral hypertrophy contributes to foraminal stenosis greatest at right C3-4, C4-5 and C5-6. No bony spinal canal stenosis. Upper chest: No pneumothorax, pulmonary nodule or pleural effusion. Other: Normal visualized paraspinal cervical soft tissues. IMPRESSION: 1. Atrophy and chronic ischemic microangiopathy without acute intracranial abnormality. 2. No acute fracture of the cervical spine. Electronically Signed   By: Ulyses Jarred M.D.   On: 12/21/2017 17:16   Ct Cervical Spine Wo Contrast  Result Date: 12/21/2017 CLINICAL DATA:  Unwitnessed fall EXAM: CT HEAD WITHOUT CONTRAST CT CERVICAL SPINE WITHOUT CONTRAST TECHNIQUE: Multidetector CT imaging of the head and cervical spine was performed following the  standard protocol without intravenous contrast. Multiplanar CT image reconstructions of the cervical spine were also generated. COMPARISON:  Head CT 01/25/2015 FINDINGS: CT HEAD FINDINGS Brain: There is no mass, hemorrhage or extra-axial collection. There is generalized atrophy without lobar predilection. There is hypoattenuation of the periventricular white matter, most commonly indicating chronic ischemic microangiopathy. Vascular: No abnormal hyperdensity of the major intracranial arteries or dural venous sinuses. No intracranial atherosclerosis. Skull: The visualized skull base, calvarium and extracranial soft tissues are normal. Sinuses/Orbits: Moderate paranasal sinus opacification. No fluid levels. The orbits are normal.  CT CERVICAL SPINE FINDINGS Alignment: Reversal of normal cervical lordosis. Grade 1 C2-3 anterolisthesis. Facet alignment is normal. Occipital condyles are aligned with the lateral masses of C1 and C2. Skull base and vertebrae: No acute fracture. Soft tissues and spinal canal: No prevertebral fluid or swelling. No visible canal hematoma. Disc levels: There is multilevel severe disc space narrowing, predominantly in the upper cervical spine. Uncovertebral hypertrophy contributes to foraminal stenosis greatest at right C3-4, C4-5 and C5-6. No bony spinal canal stenosis. Upper chest: No pneumothorax, pulmonary nodule or pleural effusion. Other: Normal visualized paraspinal cervical soft tissues. IMPRESSION: 1. Atrophy and chronic ischemic microangiopathy without acute intracranial abnormality. 2. No acute fracture of the cervical spine. Electronically Signed   By: Ulyses Jarred M.D.   On: 12/21/2017 17:16   Ct Ankle Left Wo Contrast  Result Date: 12/25/2017 CLINICAL DATA:  Left lower extremity pain and swelling since a fall several days ago. Initial encounter. EXAM: CT OF THE LOWER LEFT EXTREMITY WITHOUT CONTRAST TECHNIQUE: Multidetector CT imaging of the lower left extremity was performed according to the standard protocol. COMPARISON:  Plain films left ankle 12/22/2017. FINDINGS: Bones/Joint/Cartilage No acute bony or joint abnormality is seen. No focal bony lesion is identified. Bones appear osteopenic. There is degenerative disease about the knee with joint space narrowing and osteophytosis appearing worst on the medial side. 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 Ligaments Suboptimally assessed by CT. Major ligamentous structures appear intact. Muscles and Tendons No tear is identified. No intramuscular fluid collection is seen. Scattered areas of fatty atrophy versus small, simple lipomas are noted. Soft tissues Scattered calcifications are seen in subcutaneous tissues most notable about the distal  lower leg and ankle. Subcutaneous edema is also seen and progressive distally. No focal fluid collection. IMPRESSION: Negative for acute bony or joint abnormality. Diffuse subcutaneous edema is progressive distally and could be due to dependent change or cellulitis. Scattered punctate calcifications in the subcutaneous tissues are most consistent with prior infectious or inflammatory process. 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 Electronically Signed   By: Inge Rise M.D.   On: 12/25/2017 10:32   Dg Bone Density  Result Date: 12/19/2017 EXAM: DUAL X-RAY ABSORPTIOMETRY (DXA) FOR BONE MINERAL DENSITY IMPRESSION: Referring Physician:  Lauree Chandler Your patient completed a BMD test using Lunar IDXA DXA system ( analysis version: 16 ) manufactured by [employer]. Technologist: KT PATIENT: Name: Susan [name], Susan [name] Patient ID: [id number] Birth Date: Nov 03, 1940 Height: 60.5 in. Sex: Female Measured: 12/19/2017 Weight: 177.0 lbs. Indications: Advanced Age, Depression, Estrogen Deficient, Family History of Osteoporosis, Gabapentin, Height Loss (781.91), Postmenopausal, Wellbutrin, Zoloft, Secondary Osteoporosis Fractures: Treatments: Vitamin D (E933.5) ASSESSMENT: The BMD measured at Femur Neck Right is 0.748 g/cm2 with a T-score of -2.1. This patient is considered osteopenic according to [facility]) criteria. The scan quality is good. L-3, L-4 were excluded due to degenerative changes and overlying  artifacts. Site Region Measured Date Measured Age YA BMD Significant CHANGE T-score DualFemur Neck Right 10/19/2017    [age]         -2.1    0.748 g/cm2 AP Spine  L1-L2      10/19/2017    [age]         -1.3    1.009 g/cm2 DualFemur Total Mean 12[age]19/2017    [age]         -1.2    0.855 g/cm2 World Health Organization [facility]) criteria for post-menopausal, Caucasian Women: Normal       T-score at or above -1 SD Osteopenia   T-score between  -1 and -2.5 SD Osteoporosis T-score at or below -2.5 SD RECOMMENDATION: 1. All patients should optimize calcium and vitamin D intake. 2. Consider FDA approved medical therapies in postmenopausal women and men aged 42 years and older, based on the following: a. A hip or vertebral (clinical or morphometric) fracture b. T- score < or = -2.5 at the femoral neck or spine after appropriate evaluation to exclude secondary causes c. Low bone mass (T-score between -1.0 and -2.5 at the femoral neck or spine) and a 10 year probability of a hip fracture > or = 3% or a 10 year probability of a major osteoporosis-related fracture > or = 20% based on the [place]-adapted WHO algorithm d. Clinician judgment and/or patient preferences may indicate treatment for people with 10-year fracture probabilities above or below these levels FOLLOW-UP: People with diagnosed cases of osteoporosis or at high risk for fracture should have regular bone mineral density tests. For patients eligible for Medicare, routine testing is allowed once every 2 years. The testing frequency can be increased to one year for patients who have rapidly progressing disease, those who are receiving or discontinuing medical therapy to restore bone mass, or have additional risk factors. I have reviewed this report and agree with the above findings. [facility] FRAX* 10-year Probability of Fracture Based on femoral neck BMD: DualFemur (Right) Major Osteoporotic Fracture: 7.0% Hip Fracture:                2.0% Population:                  [place] ([place]) Risk Factors:                Secondary Osteoporosis *FRAX is a Materials engineer of the [employer] for Metabolic Bone Disease, a [place] (WHO) [facility]. ASSESSMENT: The probability of a major osteoporotic fracture is 7.0 % within the next ten years. The probability of hip fracture is 2.0 % within the next 10 years. Electronically Signed   By: Dorise Bullion III  M.D   On: 12/19/2017 16:25   Dg Knee Complete 4 Views Left  Result Date: 12/21/2017 CLINICAL DATA:  Unwitnessed fall at home, generalized LEFT knee pain EXAM: LEFT KNEE - COMPLETE 4+ VIEW COMPARISON:  09/26/2017 FINDINGS: Osseous demineralization. Diffuse joint space narrowing and spur formation greatest at lateral compartment. Subchondral sclerosis at lateral compartment. No acute fracture, dislocation, or bone destruction. No knee joint effusion. Small patellar spur at quadriceps tendon insertion. IMPRESSION: Osseous demineralization with degenerative changes LEFT knee. No acute abnormalities. Electronically Signed   By: Lavonia Dana M.D.   On: 12/21/2017 16:51   Ct Extremity Lower Left Wo Contrast  Result Date: 12/25/2017 CLINICAL DATA:  Left lower extremity pain and swelling since a fall several days ago. Initial encounter. EXAM: CT OF THE LOWER  LEFT EXTREMITY WITHOUT CONTRAST TECHNIQUE: Multidetector CT imaging of the lower left extremity was performed according to the standard protocol. COMPARISON:  Plain films left ankle 12/22/2017. FINDINGS: Bones/Joint/Cartilage No acute bony or joint abnormality is seen. No focal bony lesion is identified. Bones appear osteopenic. There is degenerative disease about the knee with joint space narrowing and osteophytosis appearing worst on the medial side. 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 Ligaments Suboptimally assessed by CT. Major ligamentous structures appear intact. Muscles and Tendons No tear is identified. No intramuscular fluid collection is seen. Scattered areas of fatty atrophy versus small, simple lipomas are noted. Soft tissues Scattered calcifications are seen in subcutaneous tissues most notable about the distal lower leg and ankle. Subcutaneous edema is also seen and progressive distally. No focal fluid collection. IMPRESSION: Negative for acute bony or joint abnormality. Diffuse subcutaneous edema is progressive distally and could be  due to dependent change or cellulitis. Scattered punctate calcifications in the subcutaneous tissues are most consistent with prior infectious or inflammatory process. 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 Electronically Signed   By: Inge Rise M.D.   On: 12/25/2017 10:32   Dg Hips Bilat With Pelvis 3-4 Views  Result Date: 12/21/2017 CLINICAL DATA:  Bilateral hip pain since a fall tonight. Initial encounter. EXAM: DG HIP (WITH OR WITHOUT PELVIS) 3-4V BILAT COMPARISON:  None. FINDINGS: There is no evidence of hip fracture or dislocation. There is no evidence of arthropathy or other focal bone abnormality. Mesh from hernia repair noted. IMPRESSION: Negative exam. Electronically Signed   By: Inge Rise M.D.   On: 12/21/2017 20:26   Vas [place] Lower Extremity Venous (dvt) (only [facility])  Result Date: 12/21/2017  Lower Venous Study Indications: Swelling, and Edema.  Performing Technologist: Abram Sander RVS  Examination Guidelines: A complete evaluation includes B-mode imaging, spectral Doppler, color Doppler, and power Doppler as needed of all accessible portions of each vessel. Bilateral testing is considered an integral part of a complete examination. Limited examinations for reoccurring indications may be performed as noted.  Right Venous Findings: +---+---------------+---------+-----------+----------+-------+    CompressibilityPhasicitySpontaneityPropertiesSummary +---+---------------+---------+-----------+----------+-------+ CFVFull           Yes      Yes                          +---+---------------+---------+-----------+----------+-------+  Left Venous Findings: +---------+---------------+---------+-----------+----------+--------------+          CompressibilityPhasicitySpontaneityPropertiesSummary        +---------+---------------+---------+-----------+----------+--------------+ CFV      Full           Yes       Yes                                 +---------+---------------+---------+-----------+----------+--------------+ SFJ      Full                                                        +---------+---------------+---------+-----------+----------+--------------+ FV Prox  Full                                                        +---------+---------------+---------+-----------+----------+--------------+  FV Mid   Full                                                        +---------+---------------+---------+-----------+----------+--------------+ FV DistalFull                                                        +---------+---------------+---------+-----------+----------+--------------+ PFV      Full                                                        +---------+---------------+---------+-----------+----------+--------------+ POP      Full           Yes      Yes                                 +---------+---------------+---------+-----------+----------+--------------+ PTV      Full                                                        +---------+---------------+---------+-----------+----------+--------------+ PERO                                                  Not visualized +---------+---------------+---------+-----------+----------+--------------+    Summary: Right: No evidence of common femoral vein obstruction. Left: There is no evidence of deep vein thrombosis in the lower extremity. No cystic structure found in the popliteal fossa.  *See table(s) above for measurements and observations. Electronically signed by Deitra Mayo MD on 12/21/2017 at 5:51:56 PM.    Final    (Echo, Carotid, EGD, Colonoscopy, ERCP)    Subjective: Feels better than yesterday still with left lower extremity pain tried walking with PT yesterday.  Discharge Exam: Vitals:   12/25/17 0353 12/25/17 0952  BP: (!) 102/57 (!) 145/80  Pulse: 84 64  Resp: 20 20   Temp: 97.8 F (36.6 C) 98.6 F (37 C)  SpO2: 97% 99%   Vitals:   12/25/17 0020 12/25/17 0348 12/25/17 0353 12/25/17 0952  BP:  (!) 159/69 (!) 102/57 (!) 145/80  Pulse: 80 72 84 64  Resp: (!) 22 19 20 20   Temp:  99.5 F (37.5 C) 97.8 F (36.6 C) 98.6 F (37 C)  TempSrc:  Oral Oral Oral  SpO2: 98% 97% 97% 99%  Weight:      Height:        General: Pt is alert, awake, not in acute distress Cardiovascular: RRR, S1/S2 +, no rubs, no gallops Respiratory: CTA bilaterally, no wheezing, no rhonchi Abdominal: Soft, NT, ND, bowel sounds + Extremities: Lower extremity 1+ edema erythema and tenderness which is better than yesterday.  The results of significant diagnostics from this hospitalization (including imaging, microbiology, ancillary and laboratory) are listed below for reference.     Microbiology: Recent Results (from the past 240 hour(s))  Culture, blood (Routine X 2) w Reflex to ID Panel     Status: None (Preliminary result)   Collection Time: 12/21/17  3:27 PM  Result Value Ref Range Status   Specimen Description BLOOD BLOOD LEFT ARM  Final   Special Requests   Final    BOTTLES DRAWN AEROBIC AND ANAEROBIC Blood Culture adequate volume   Culture   Final    NO GROWTH 4 DAYS Performed at [facility], [address], [place], [place] [address]    Report Status PENDING  Incomplete  Culture, blood (Routine X 2) w Reflex to ID Panel     Status: None (Preliminary result)   Collection Time: 12/21/17  3:29 PM  Result Value Ref Range Status   Specimen Description BLOOD LEFT ANTECUBITAL  Final   Special Requests   Final    BOTTLES DRAWN AEROBIC AND ANAEROBIC Blood Culture adequate volume   Culture   Final    NO GROWTH 4 DAYS Performed at [facility], [place] [address] [place], [place] [address]    Report Status PENDING  Incomplete     Labs: BNP (last 3 results) No results for input(s): BNP in the last 8760 hours. Basic Metabolic Panel: Recent Labs  Lab  12/21/17 1527 12/22/17 0442 12/23/17 0355 12/24/17 0541 12/25/17 0823  NA 140 140 139 138 138  K 3.9 3.5 3.6 3.2* 3.2*  CL 105 108 109 108 110  CO2 22 21* 19* 20* 22  GLUCOSE 130* 117* 103* 108* 119*  BUN 23 20 24* 21 14  CREATININE 1.49* 1.49* 1.66* 1.33* 1.11*  CALCIUM 9.9 9.3 9.3 9.2 9.1   Liver Function Tests: Recent Labs  Lab 12/21/17 1527  AST 48*  ALT 29  ALKPHOS 50  BILITOT 0.6  PROT 6.9  ALBUMIN 3.6   No results for input(s): LIPASE, AMYLASE in the last 168 hours. No results for input(s): AMMONIA in the last 168 hours. CBC: Recent Labs  Lab 12/21/17 1527 12/22/17 0442 12/23/17 0355 12/24/17 0541 12/25/17 0823  WBC 19.5* 16.5* 13.8* 8.8 6.9  NEUTROABS 17.9*  --   --   --   --   HGB 10.1* 9.5* 9.5* 8.9* 8.3*  HCT 31.8* 30.5* 29.5* 27.4* 25.4*  MCV 94.9 94.7 95.5 93.5 92.7  PLT 208 189 169 186 193   Cardiac Enzymes: No results for input(s): CKTOTAL, CKMB, CKMBINDEX, TROPONINI in the last 168 hours. BNP: Invalid input(s): POCBNP CBG: No results for input(s): GLUCAP in the last 168 hours. D-Dimer No results for input(s): DDIMER in the last 72 hours. Hgb A1c No results for input(s): HGBA1C in the last 72 hours. Lipid Profile No results for input(s): CHOL, HDL, LDLCALC, TRIG, CHOLHDL, LDLDIRECT in the last 72 hours. Thyroid function studies No results for input(s): TSH, T4TOTAL, T3FREE, THYROIDAB in the last 72 hours.  Invalid input(s): FREET3 Anemia work up No results for input(s): VITAMINB12, FOLATE, FERRITIN, TIBC, IRON, RETICCTPCT in the last 72 hours. Urinalysis    Component Value Date/Time   COLORURINE YELLOW 12/22/2017 0236   APPEARANCEUR CLEAR 12/22/2017 0236   LABSPEC 1.014 12/22/2017 0236   PHURINE 7.0 12/22/2017 0236   GLUCOSEU NEGATIVE 12/22/2017 0236   HGBUR MODERATE (A) 12/22/2017 0236   BILIRUBINUR NEGATIVE 12/22/2017 0236   KETONESUR NEGATIVE 12/22/2017 0236  PROTEINUR NEGATIVE 12/22/2017 0236   UROBILINOGEN 0.2 03/05/2013  1259   NITRITE NEGATIVE 12/22/2017 0236   LEUKOCYTESUR NEGATIVE 12/22/2017 0236   Sepsis Labs Invalid input(s): PROCALCITONIN,  WBC,  LACTICIDVEN Microbiology Recent Results (from the past 240 hour(s))  Culture, blood (Routine X 2) w Reflex to ID Panel     Status: None (Preliminary result)   Collection Time: 12/21/17  3:27 PM  Result Value Ref Range Status   Specimen Description BLOOD BLOOD LEFT ARM  Final   Special Requests   Final    BOTTLES DRAWN AEROBIC AND ANAEROBIC Blood Culture adequate volume   Culture   Final    NO GROWTH 4 DAYS Performed at [facility], [place] [address], [place], [place] [address]    Report Status PENDING  Incomplete  Culture, blood (Routine X 2) w Reflex to ID Panel     Status: None (Preliminary result)   Collection Time: 12/21/17  3:29 PM  Result Value Ref Range Status   Specimen Description BLOOD LEFT ANTECUBITAL  Final   Special Requests   Final    BOTTLES DRAWN AEROBIC AND ANAEROBIC Blood Culture adequate volume   Culture   Final    NO GROWTH 4 DAYS Performed at [facility], [place] [address] [place], [place] [address]    Report Status PENDING  Incomplete     Time coordinating discharge:  34 minutes  SIGNED:   Georgette Shell, MD  [facility] 12/25/2017, 10:44 AM Pager   If 7PM-7AM, please contact night-coverage [email or web address] Password [email or web address]

## 2017-12-25 NOTE — Progress Notes (Signed)
Rt Note: patient placed on cpap QHS 5 cmh20 no issues saturations of 98% maintaning  Will continue to monitor

## 2017-12-25 NOTE — NC FL2 (Signed)
[place] LEVEL OF CARE SCREENING TOOL     IDENTIFICATION  Patient Name: Susan [name] Birthdate: June 26, 1940 Sex: female Admission Date (Current Location): 12/21/2017  [facility] and [place] Number:  Herbalist and Address:  [facility], [place] [address] [place], [place] [address]      Provider Number: [id number]  Attending Physician Name and Address:  Georgette Shell, MD  Relative Name and Phone Number:  Greta Doom - daughter, [phone]    Current Level of Care: Hospital Recommended Level of Care: [place] Prior Approval Number:    Date Approved/Denied:   PASRR Number: [id number](Eff. 07/31/12)  Discharge Plan: SNF    Current Diagnoses: Patient Active Problem List   Diagnosis Date Noted  . Ankle edema   . Sepsis ([place]) 12/21/2017  . Medication management 10/11/2016  . Hyperlipemia 06/18/2016  . Gait abnormality 06/18/2016  . Major depressive disorder, recurrent episode, severe ([place]) 11/21/2015  . Edema 02/09/2015  . History of CVA (cerebrovascular accident) 02/01/2015  . 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 ([place])   . 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 Chronic low back pain 01/28/2015  . Short-term memory loss 01/28/2015  . Volume depletion 01/28/2015  . Persistent atrial fibrillation 01/26/2015  . DCM (dilated cardiomyopathy) ([place]) 01/26/2015  . TIA (transient ischemic attack) 01/24/2015  . Chronic diastolic (congestive) heart failure ([place])   . Obesity (BMI 30-39.9) 01/27/2013  . Constipation 09/02/2012  . H/O arthroscopy of right knee 08/06/2012  . Anxiety   . Depression   . GERD (gastroesophageal reflux disease)   . Hypertensive heart disease   . OSA (obstructive sleep apnea)   . Osteoarthritis of right knee   . Hyperparathyroidism, primary ([place]) 04/22/2012  . Chronic cough 10/02/2011    Orientation RESPIRATION BLADDER Height & Weight     Self,  Situation, Place  Other (Comment)(CPAP at night: full face mask, auto Titrate. Per respiratory therapist note placed at 12:21 am Thursday morning: QHS 5-chm 20) External catheter(Placed 12/15) Weight: 188 lb 11.4 oz (85.6 kg) Height:  5\' 2"  (157.5 cm)  BEHAVIORAL SYMPTOMS/MOOD NEUROLOGICAL BOWEL NUTRITION STATUS      Incontinent Diet(Heart healthy)  AMBULATORY STATUS COMMUNICATION OF NEEDS Skin   Total Care(Per PT during eval on 12/17, "L ankle pain limiting her ability to perform bed mobility, transfer or amb safely and independently".) Verbally Other (Comment)(MASD to sacrum, treated with barrier cream; Cellulitis leg and foot)                       Personal Care Assistance Level of Assistance  Bathing, Feeding, Dressing Bathing Assistance: Maximum assistance(Upper body min assist, Lower body total assist) Feeding assistance: Independent Dressing Assistance: Maximum assistance(Upper body min assist, Lower body total assist)     Functional Limitations Info  Sight, Hearing, Speech Sight Info: Impaired(Wears glasses to read) Hearing Info: Adequate Speech Info: Adequate    SPECIAL CARE FACTORS FREQUENCY  PT (By licensed PT), OT (By licensed OT)     PT Frequency: Evaluated 12/17. PT at SNF eval and treat.  OT Frequency: Evaluated 12/18. OT at Snf eval and treat            Contractures Contractures Info: Not present    Additional Factors Info  Code Status, Allergies Code Status Info: Full Allergies Info: Ace Inhibitors, Lipitor, Simvastatin           Current Medications (12/25/2017):  This  is the current hospital active medication list Current Facility-Administered Medications  Medication Dose Route Frequency Provider Last Rate Last Dose  . 0.9 %  sodium chloride infusion   Intravenous Continuous Georgette Shell, MD 125 mL/hr at 12/25/17 [phone]    . acetaminophen (TYLENOL) tablet 1,000 mg  1,000 mg Oral Q6H PRN Domenic Polite, MD   1,000 mg at 12/25/17 2683  .  apixaban (ELIQUIS) tablet 5 mg  5 mg Oral Q12H Domenic Polite, MD   5 mg at 12/25/17 4196  . buPROPion (WELLBUTRIN XL) 24 hr tablet 450 mg  450 mg Oral Daily Domenic Polite, MD   450 mg at 12/25/17 1000  . calcium-vitamin D (OSCAL WITH D) 500-200 MG-UNIT per tablet 1 tablet  1 tablet Oral BID Georgette Shell, MD   1 tablet at 12/25/17 1001  . carvedilol (COREG) tablet 12.5 mg  12.5 mg Oral BID WC Domenic Polite, MD   12.5 mg at 12/25/17 2229  . donepezil (ARICEPT) tablet 10 mg  10 mg Oral QHS Domenic Polite, MD   10 mg at 12/24/17 2152  . gabapentin (NEURONTIN) capsule 300 mg  300 mg Oral BID Domenic Polite, MD   300 mg at 12/25/17 1003  . hydrALAZINE (APRESOLINE) tablet 50 mg  50 mg Oral BID Domenic Polite, MD   50 mg at 12/25/17 1001  . HYDROmorphone (DILAUDID) injection 0.5 mg  0.5 mg Intravenous Q4H PRN Georgette Shell, MD      . multivitamins with iron tablet 1 tablet  1 tablet Oral Daily Georgette Shell, MD   1 tablet at 12/25/17 1003  . ondansetron (ZOFRAN) tablet 4 mg  4 mg Oral Q6H PRN Domenic Polite, MD       Or  . ondansetron [facility]) injection 4 mg  4 mg Intravenous Q6H PRN Domenic Polite, MD      . pravastatin (PRAVACHOL) tablet 40 mg  40 mg Oral QHS Domenic Polite, MD   40 mg at 12/24/17 2152  . saccharomyces boulardii (FLORASTOR) capsule 250 mg  250 mg Oral BID Georgette Shell, MD   250 mg at 12/25/17 1000  . sertraline (ZOLOFT) tablet 12.5 mg  12.5 mg Oral BID Domenic Polite, MD   12.5 mg at 12/25/17 1003  . vancomycin (VANCOCIN) 500 mg in sodium chloride 0.9 % 100 mL IVPB  500 mg Intravenous Q24H Domenic Polite, MD 100 mL/hr at 12/24/17 2201 500 mg at 12/24/17 2201  . vitamin B-12 (CYANOCOBALAMIN) tablet 2,000 mcg  2,000 mcg Oral Daily Georgette Shell, MD   2,000 mcg at 12/25/17 1001     Discharge Medications: Please see discharge summary for a list of discharge medications.  Relevant Imaging Results:  Relevant Lab Results:   Additional  Information ss#[id number]. Other CPAP information: Auto mode, min. pressure 6cm, max pessure 20 cm  Sharlet Salina Mila Homer, LCSW

## 2017-12-25 NOTE — Progress Notes (Signed)
Attempted to call report, no one was able to take report due to shift change, left name and number with Caryl Pina, NT.

## 2017-12-25 NOTE — Telephone Encounter (Signed)
LMOM to follow up if procedure has been scheduled.

## 2017-12-25 NOTE — Clinical Social Work Placement (Signed)
   CLINICAL SOCIAL WORK PLACEMENT  NOTE *12/25/17 - DISCHARGED TO [facility] (PT[facility])  Date:  12/25/2017  Patient Details  Name: COURTNEI RUDDELL MRN: [id number] Date of Birth: Jun 16, 1940  Clinical Social Work is seeking post-discharge placement for this patient at the [place] level of care (*CSW will initial, date and re-position this form in  chart as items are completed):  Yes   Patient/family provided with [facility] Work Department's list of facilities offering this level of care within the geographic area requested by the patient (or if unable, by the patient's family).  Yes   Patient/family informed of their freedom to choose among providers that offer the needed level of care, that participate in Medicare, Medicaid or managed care program needed by the patient, have an available bed and are willing to accept the patient.  Yes   Patient/family informed of [facility]'s ownership interest in [facility] and [facility], as well as of the fact that they are under no obligation to receive care at these facilities.  PASRR submitted to EDS on       PASRR number received on       Existing PASRR number confirmed on 12/25/17     FL2 transmitted to all facilities in geographic area requested by pt/family on 12/25/17     FL2 transmitted to all facilities within larger geographic area on       Patient informed that his/her managed care company has contracts with or will negotiate with certain facilities, including the following:        Yes   Patient/family informed of bed offers received.  Patient chooses bed at [facility]     Physician recommends and patient chooses bed at      Patient to be transferred to [facility] on 12/25/17.  Patient to be transferred to facility by Ambulance     Patient family notified on 12/25/17 of transfer.  Name of family member notified:  Greta Doom - [phone]     PHYSICIAN        Additional Comment:    _______________________________________________ Sable Feil, LCSW 12/25/2017, 3:41 PM

## 2017-12-25 NOTE — Progress Notes (Signed)
Patient left with transport, patient called her daughter and asked them to pick up the plants that transport was unable to take with her. Plants are at the front desk.

## 2017-12-25 NOTE — Progress Notes (Signed)
Report called to Otila Kluver, Therapist, sports at [facility]

## 2017-12-25 NOTE — Clinical Social Work Note (Signed)
Clinical Social Work Assessment  Patient Details  Name: Susan [name] MRN: [id number] Date of Birth: 1940/07/06  Date of referral:  12/25/17               Reason for consult:  Facility Placement, Discharge Planning                Permission sought to share information with:    Permission granted to share information::  Yes, Verbal Permission Granted  Name::     Greta Doom  Agency::     Relationship::  Daughter  Contact Information:  [phone]  Housing/Transportation Living arrangements for the past 2 months:  Apartment Source of Information:  Patient Patient Interpreter Needed:  None Criminal Activity/Legal Involvement Pertinent to Current Situation/Hospitalization:  No - Comment as needed Significant Relationships:  Adult Children Lives with:  Self Do you feel safe going back to the place where you live?  No(Patient in agreement with ST rehab before returning home) Need for family participation in patient care:  Yes (Comment)  Care giving concerns:  Patient reported that she lives alone. Ms. Calles expressed agreement with short-term to help her get stronger before returning home.  Social Worker assessment / plan:  CSW talked with patient at the bedside regarding recommendation for ST rehab. Ms. Tabak was sitting up in bed and was alert, oriented and open to talking with CSW. Ms. Carranza informed CW that she has 3 daughters and 2 ([name] and [place]) live nearby and the oldest daughter lives in [place].  ST rehab discussed and patient expressed understanding and agreement. CSW advised that her facility preference is [facility], as she has been there some years ago.   Employment status:  Retired Char[employer] fundraiser) PT Recommendations:  [place] / Referral to community resources:  Skilled Nursing Facility(SNF list provided )  Patient/Family's Response to care:  No concerns expressed by patient regarding care  during hospitalization.  Patient/Family's Understanding of and Emotional Response to Diagnosis, Current Treatment, and Prognosis: Patient appeared to understand the need for rehab at a skilled facility before returning home. She contacted her daughter Sharee Pimple by phone while CSW in the room and daughter also expressed agreement.  Emotional Assessment Appearance:  Appears younger than stated age Attitude/Demeanor/Rapport:  Engaged Affect (typically observed):  Appropriate, Pleasant Orientation:  Oriented to Self, Oriented to Place, Oriented to Situation Alcohol / Substance use:  Tobacco Use, Alcohol Use, Illicit Drugs(Patient reported that she quit smoking and does not drink alcohol or use illicit drugs) Psych involvement (Current and /or in the community):  No (Comment)  Discharge Needs  Concerns to be addressed:  Discharge Planning Concerns Readmission within the last 30 days:  No Current discharge risk:  None Barriers to Discharge:  No Barriers Identified   Sable Feil, LCSW 12/25/2017, 3:30 PM

## 2017-12-26 ENCOUNTER — Telehealth: Payer: Self-pay

## 2017-12-26 LAB — CULTURE, BLOOD (ROUTINE X 2)
CULTURE: NO GROWTH
Culture: NO GROWTH
SPECIAL REQUESTS: ADEQUATE
SPECIAL REQUESTS: ADEQUATE

## 2017-12-26 NOTE — Telephone Encounter (Signed)
LMOM to follow up 

## 2017-12-26 NOTE — Telephone Encounter (Signed)
Called and patient is in [place]

## 2017-12-28 DIAGNOSIS — L03116 Cellulitis of left lower limb: Secondary | ICD-10-CM

## 2017-12-29 ENCOUNTER — Other Ambulatory Visit: Payer: Self-pay

## 2017-12-29 ENCOUNTER — Emergency Department (HOSPITAL_COMMUNITY): Payer: PPO

## 2017-12-29 ENCOUNTER — Inpatient Hospital Stay (HOSPITAL_COMMUNITY)
Admission: EM | Admit: 2017-12-29 | Discharge: 2018-01-03 | DRG: 603 | Disposition: A | Discharge status: Skilled Nursing Facility | Payer: PPO | Attending: Internal Medicine | Admitting: Internal Medicine

## 2017-12-29 ENCOUNTER — Encounter (HOSPITAL_COMMUNITY): Payer: Self-pay

## 2017-12-29 DIAGNOSIS — D649 Anemia, unspecified: Secondary | ICD-10-CM | POA: Diagnosis not present

## 2017-12-29 DIAGNOSIS — R609 Edema, unspecified: Secondary | ICD-10-CM | POA: Diagnosis not present

## 2017-12-29 DIAGNOSIS — R32 Unspecified urinary incontinence: Secondary | ICD-10-CM | POA: Diagnosis not present

## 2017-12-29 DIAGNOSIS — G4733 Obstructive sleep apnea (adult) (pediatric): Secondary | ICD-10-CM | POA: Diagnosis present

## 2017-12-29 DIAGNOSIS — M545 Low back pain: Secondary | ICD-10-CM | POA: Diagnosis present

## 2017-12-29 DIAGNOSIS — S81802A Unspecified open wound, left lower leg, initial encounter: Secondary | ICD-10-CM | POA: Diagnosis not present

## 2017-12-29 DIAGNOSIS — Z801 Family history of malignant neoplasm of trachea, bronchus and lung: Secondary | ICD-10-CM

## 2017-12-29 DIAGNOSIS — R0989 Other specified symptoms and signs involving the circulatory and respiratory systems: Secondary | ICD-10-CM | POA: Diagnosis not present

## 2017-12-29 DIAGNOSIS — Z8711 Personal history of peptic ulcer disease: Secondary | ICD-10-CM

## 2017-12-29 DIAGNOSIS — I5032 Chronic diastolic (congestive) heart failure: Secondary | ICD-10-CM | POA: Diagnosis present

## 2017-12-29 DIAGNOSIS — Z9049 Acquired absence of other specified parts of digestive tract: Secondary | ICD-10-CM

## 2017-12-29 DIAGNOSIS — R05 Cough: Secondary | ICD-10-CM | POA: Diagnosis not present

## 2017-12-29 DIAGNOSIS — I110 Hypertensive heart disease with heart failure: Secondary | ICD-10-CM | POA: Diagnosis not present

## 2017-12-29 DIAGNOSIS — Z8673 Personal history of transient ischemic attack (TIA), and cerebral infarction without residual deficits: Secondary | ICD-10-CM

## 2017-12-29 DIAGNOSIS — L02419 Cutaneous abscess of limb, unspecified: Secondary | ICD-10-CM | POA: Diagnosis present

## 2017-12-29 DIAGNOSIS — Z7901 Long term (current) use of anticoagulants: Secondary | ICD-10-CM

## 2017-12-29 DIAGNOSIS — Z8249 Family history of ischemic heart disease and other diseases of the circulatory system: Secondary | ICD-10-CM

## 2017-12-29 DIAGNOSIS — M6281 Muscle weakness (generalized): Secondary | ICD-10-CM | POA: Diagnosis not present

## 2017-12-29 DIAGNOSIS — L03119 Cellulitis of unspecified part of limb: Secondary | ICD-10-CM

## 2017-12-29 DIAGNOSIS — K429 Umbilical hernia without obstruction or gangrene: Secondary | ICD-10-CM | POA: Diagnosis not present

## 2017-12-29 DIAGNOSIS — R4701 Aphasia: Secondary | ICD-10-CM | POA: Diagnosis not present

## 2017-12-29 DIAGNOSIS — Z888 Allergy status to other drugs, medicaments and biological substances status: Secondary | ICD-10-CM

## 2017-12-29 DIAGNOSIS — M4800 Spinal stenosis, site unspecified: Secondary | ICD-10-CM | POA: Diagnosis present

## 2017-12-29 DIAGNOSIS — E210 Primary hyperparathyroidism: Secondary | ICD-10-CM | POA: Diagnosis present

## 2017-12-29 DIAGNOSIS — G8929 Other chronic pain: Secondary | ICD-10-CM | POA: Diagnosis not present

## 2017-12-29 DIAGNOSIS — I10 Essential (primary) hypertension: Secondary | ICD-10-CM | POA: Diagnosis not present

## 2017-12-29 DIAGNOSIS — Z803 Family history of malignant neoplasm of breast: Secondary | ICD-10-CM

## 2017-12-29 DIAGNOSIS — N183 Chronic kidney disease, stage 3 unspecified: Secondary | ICD-10-CM | POA: Diagnosis present

## 2017-12-29 DIAGNOSIS — M2550 Pain in unspecified joint: Secondary | ICD-10-CM | POA: Diagnosis not present

## 2017-12-29 DIAGNOSIS — D631 Anemia in chronic kidney disease: Secondary | ICD-10-CM | POA: Diagnosis present

## 2017-12-29 DIAGNOSIS — R52 Pain, unspecified: Secondary | ICD-10-CM | POA: Diagnosis not present

## 2017-12-29 DIAGNOSIS — Z87891 Personal history of nicotine dependence: Secondary | ICD-10-CM

## 2017-12-29 DIAGNOSIS — Z9849 Cataract extraction status, unspecified eye: Secondary | ICD-10-CM

## 2017-12-29 DIAGNOSIS — I119 Hypertensive heart disease without heart failure: Secondary | ICD-10-CM | POA: Diagnosis present

## 2017-12-29 DIAGNOSIS — Z79899 Other long term (current) drug therapy: Secondary | ICD-10-CM

## 2017-12-29 DIAGNOSIS — F419 Anxiety disorder, unspecified: Secondary | ICD-10-CM | POA: Diagnosis present

## 2017-12-29 DIAGNOSIS — K219 Gastro-esophageal reflux disease without esophagitis: Secondary | ICD-10-CM | POA: Diagnosis not present

## 2017-12-29 DIAGNOSIS — F332 Major depressive disorder, recurrent severe without psychotic features: Secondary | ICD-10-CM | POA: Diagnosis not present

## 2017-12-29 DIAGNOSIS — I69322 Dysarthria following cerebral infarction: Secondary | ICD-10-CM | POA: Diagnosis not present

## 2017-12-29 DIAGNOSIS — Z96651 Presence of right artificial knee joint: Secondary | ICD-10-CM | POA: Diagnosis present

## 2017-12-29 DIAGNOSIS — I69359 Hemiplegia and hemiparesis following cerebral infarction affecting unspecified side: Secondary | ICD-10-CM

## 2017-12-29 DIAGNOSIS — L03116 Cellulitis of left lower limb: Secondary | ICD-10-CM | POA: Diagnosis not present

## 2017-12-29 DIAGNOSIS — I5030 Unspecified diastolic (congestive) heart failure: Secondary | ICD-10-CM | POA: Diagnosis present

## 2017-12-29 DIAGNOSIS — M79662 Pain in left lower leg: Secondary | ICD-10-CM | POA: Diagnosis not present

## 2017-12-29 DIAGNOSIS — R41841 Cognitive communication deficit: Secondary | ICD-10-CM | POA: Diagnosis not present

## 2017-12-29 DIAGNOSIS — I420 Dilated cardiomyopathy: Secondary | ICD-10-CM | POA: Diagnosis not present

## 2017-12-29 DIAGNOSIS — M7989 Other specified soft tissue disorders: Secondary | ICD-10-CM | POA: Diagnosis not present

## 2017-12-29 DIAGNOSIS — E785 Hyperlipidemia, unspecified: Secondary | ICD-10-CM | POA: Diagnosis present

## 2017-12-29 DIAGNOSIS — I130 Hypertensive heart and chronic kidney disease with heart failure and stage 1 through stage 4 chronic kidney disease, or unspecified chronic kidney disease: Secondary | ICD-10-CM | POA: Diagnosis not present

## 2017-12-29 DIAGNOSIS — R2689 Other abnormalities of gait and mobility: Secondary | ICD-10-CM | POA: Diagnosis not present

## 2017-12-29 DIAGNOSIS — Z7401 Bed confinement status: Secondary | ICD-10-CM | POA: Diagnosis not present

## 2017-12-29 DIAGNOSIS — E892 Postprocedural hypoparathyroidism: Secondary | ICD-10-CM | POA: Diagnosis present

## 2017-12-29 DIAGNOSIS — R0902 Hypoxemia: Secondary | ICD-10-CM | POA: Diagnosis not present

## 2017-12-29 DIAGNOSIS — Z823 Family history of stroke: Secondary | ICD-10-CM

## 2017-12-29 DIAGNOSIS — L02416 Cutaneous abscess of left lower limb: Secondary | ICD-10-CM | POA: Diagnosis present

## 2017-12-29 DIAGNOSIS — R2681 Unsteadiness on feet: Secondary | ICD-10-CM | POA: Diagnosis not present

## 2017-12-29 DIAGNOSIS — Z91040 Latex allergy status: Secondary | ICD-10-CM

## 2017-12-29 DIAGNOSIS — I4819 Other persistent atrial fibrillation: Secondary | ICD-10-CM | POA: Diagnosis present

## 2017-12-29 DIAGNOSIS — R509 Fever, unspecified: Secondary | ICD-10-CM | POA: Diagnosis not present

## 2017-12-29 DIAGNOSIS — R278 Other lack of coordination: Secondary | ICD-10-CM | POA: Diagnosis not present

## 2017-12-29 DIAGNOSIS — L0390 Cellulitis, unspecified: Secondary | ICD-10-CM | POA: Diagnosis not present

## 2017-12-29 DIAGNOSIS — F0150 Vascular dementia without behavioral disturbance: Secondary | ICD-10-CM | POA: Diagnosis present

## 2017-12-29 HISTORY — DX: Vascular dementia, unspecified severity, without behavioral disturbance, psychotic disturbance, mood disturbance, and anxiety: F01.50

## 2017-12-29 HISTORY — DX: Cellulitis of unspecified part of limb: L03.119

## 2017-12-29 HISTORY — DX: Cutaneous abscess of limb, unspecified: L02.419

## 2017-12-29 LAB — CBC WITH DIFFERENTIAL/PLATELET
Abs Immature Granulocytes: 0.25 10*3/uL — ABNORMAL HIGH (ref 0.00–0.07)
BASOS PCT: 0 %
Basophils Absolute: 0 10*3/uL (ref 0.0–0.1)
EOS ABS: 0.2 10*3/uL (ref 0.0–0.5)
Eosinophils Relative: 2 %
HCT: 27.3 % — ABNORMAL LOW (ref 36.0–46.0)
Hemoglobin: 8.6 g/dL — ABNORMAL LOW (ref 12.0–15.0)
Immature Granulocytes: 3 %
Lymphocytes Relative: 12 %
Lymphs Abs: 1.1 10*3/uL (ref 0.7–4.0)
MCH: 29.8 pg (ref 26.0–34.0)
MCHC: 31.5 g/dL (ref 30.0–36.0)
MCV: 94.5 fL (ref 80.0–100.0)
Monocytes Absolute: 1.1 10*3/uL — ABNORMAL HIGH (ref 0.1–1.0)
Monocytes Relative: 12 %
Neutro Abs: 6.8 10*3/uL (ref 1.7–7.7)
Neutrophils Relative %: 71 %
PLATELETS: 380 10*3/uL (ref 150–400)
RBC: 2.89 MIL/uL — ABNORMAL LOW (ref 3.87–5.11)
RDW: 14 % (ref 11.5–15.5)
WBC: 9.5 10*3/uL (ref 4.0–10.5)
nRBC: 0 % (ref 0.0–0.2)

## 2017-12-29 LAB — COMPREHENSIVE METABOLIC PANEL
ALT: 33 U/L (ref 0–44)
AST: 38 U/L (ref 15–41)
Albumin: 2.2 g/dL — ABNORMAL LOW (ref 3.5–5.0)
Alkaline Phosphatase: 76 U/L (ref 38–126)
Anion gap: 8 (ref 5–15)
BUN: 13 mg/dL (ref 8–23)
CALCIUM: 9.7 mg/dL (ref 8.9–10.3)
CO2: 25 mmol/L (ref 22–32)
CREATININE: 1.26 mg/dL — AB (ref 0.44–1.00)
Chloride: 103 mmol/L (ref 98–111)
GFR calc Af Amer: 47 mL/min — ABNORMAL LOW (ref >60)
GFR calc non Af Amer: 40 mL/min — ABNORMAL LOW (ref >60)
Glucose, Bld: 117 mg/dL — ABNORMAL HIGH (ref 70–99)
Potassium: 3.9 mmol/L (ref 3.5–5.1)
Sodium: 136 mmol/L (ref 135–145)
Total Bilirubin: 1.2 mg/dL (ref 0.3–1.2)
Total Protein: 6 g/dL — ABNORMAL LOW (ref 6.5–8.1)

## 2017-12-29 LAB — URINALYSIS, ROUTINE W REFLEX MICROSCOPIC
Bilirubin Urine: NEGATIVE
Glucose, UA: NEGATIVE mg/dL
Hgb urine dipstick: NEGATIVE
Ketones, ur: NEGATIVE mg/dL
Leukocytes, UA: NEGATIVE
Nitrite: NEGATIVE
Protein, ur: NEGATIVE mg/dL
Specific Gravity, Urine: <1.005 — ABNORMAL LOW (ref 1.005–1.030)
pH: 5.5 (ref 5.0–8.0)

## 2017-12-29 LAB — I-STAT CG4 LACTIC ACID, ED: Lactic Acid, Venous: 1.21 mmol/L (ref 0.5–1.9)

## 2017-12-29 MED ORDER — POLYSACCHARIDE IRON COMPLEX 150 MG PO CAPS
150.0000 mg | ORAL_CAPSULE | Freq: Every day | ORAL | Status: DC
  Administered 2017-12-30 – 2018-01-03 (×5): 150 mg via ORAL
  Filled 2017-12-29 (×5): qty 1

## 2017-12-29 MED ORDER — ONDANSETRON HCL 4 MG/2ML IJ SOLN: 4.0000 mg | Freq: Four times a day (QID) | INTRAMUSCULAR | Status: DC | PRN

## 2017-12-29 MED ORDER — PRAVASTATIN SODIUM 40 MG PO TABS
40.0000 mg | ORAL_TABLET | Freq: Every evening | ORAL | Status: DC
  Administered 2017-12-29 – 2018-01-02 (×5): 40 mg via ORAL
  Filled 2017-12-29 (×5): qty 1

## 2017-12-29 MED ORDER — LACTATED RINGERS IV SOLN
INTRAVENOUS | Status: DC
  Administered 2017-12-29: 17:00:00 via INTRAVENOUS

## 2017-12-29 MED ORDER — PANTOPRAZOLE SODIUM 40 MG PO TBEC
40.0000 mg | DELAYED_RELEASE_TABLET | Freq: Every day | ORAL | Status: DC
  Administered 2017-12-30 – 2018-01-03 (×5): 40 mg via ORAL
  Filled 2017-12-29 (×5): qty 1

## 2017-12-29 MED ORDER — FUROSEMIDE 20 MG PO TABS
20.0000 mg | ORAL_TABLET | Freq: Every day | ORAL | Status: DC
  Administered 2017-12-30 – 2018-01-03 (×5): 20 mg via ORAL
  Filled 2017-12-29 (×5): qty 1

## 2017-12-29 MED ORDER — PIPERACILLIN-TAZOBACTAM 3.375 G IVPB
3.3750 g | Freq: Three times a day (TID) | INTRAVENOUS | Status: DC
  Administered 2017-12-30 – 2018-01-01 (×5): 3.375 g via INTRAVENOUS
  Filled 2017-12-29 (×6): qty 50

## 2017-12-29 MED ORDER — DOCUSATE SODIUM 100 MG PO CAPS
100.0000 mg | ORAL_CAPSULE | Freq: Two times a day (BID) | ORAL | Status: DC
  Administered 2017-12-29 – 2018-01-03 (×11): 100 mg via ORAL
  Filled 2017-12-29 (×11): qty 1

## 2017-12-29 MED ORDER — IOHEXOL 300 MG/ML  SOLN
100.0000 mL | Freq: Once | INTRAMUSCULAR | Status: AC | PRN
  Administered 2017-12-29: 100 mL via INTRAVENOUS

## 2017-12-29 MED ORDER — APIXABAN 5 MG PO TABS
5.0000 mg | ORAL_TABLET | Freq: Two times a day (BID) | ORAL | Status: DC
  Administered 2017-12-29 – 2018-01-03 (×10): 5 mg via ORAL
  Filled 2017-12-29 (×10): qty 1

## 2017-12-29 MED ORDER — CYCLOBENZAPRINE HCL 10 MG PO TABS: 5.0000 mg | ORAL_TABLET | Freq: Three times a day (TID) | ORAL | Status: DC | PRN

## 2017-12-29 MED ORDER — MORPHINE SULFATE (PF) 2 MG/ML IV SOLN
2.0000 mg | INTRAVENOUS | Status: DC | PRN
  Administered 2018-01-01 – 2018-01-02 (×2): 2 mg via INTRAVENOUS
  Filled 2017-12-29 (×2): qty 1

## 2017-12-29 MED ORDER — GABAPENTIN 300 MG PO CAPS
300.0000 mg | ORAL_CAPSULE | Freq: Two times a day (BID) | ORAL | Status: DC
  Administered 2017-12-29 – 2018-01-03 (×10): 300 mg via ORAL
  Filled 2017-12-29 (×10): qty 1

## 2017-12-29 MED ORDER — DONEPEZIL HCL 10 MG PO TABS
10.0000 mg | ORAL_TABLET | Freq: Every day | ORAL | Status: DC
  Administered 2017-12-29 – 2018-01-02 (×5): 10 mg via ORAL
  Filled 2017-12-29 (×5): qty 1

## 2017-12-29 MED ORDER — CARVEDILOL 12.5 MG PO TABS
12.5000 mg | ORAL_TABLET | Freq: Two times a day (BID) | ORAL | Status: DC
  Administered 2017-12-29 – 2018-01-03 (×10): 12.5 mg via ORAL
  Filled 2017-12-29 (×10): qty 1

## 2017-12-29 MED ORDER — ACETAMINOPHEN 650 MG RE SUPP: 650.0000 mg | Freq: Four times a day (QID) | RECTAL | Status: DC | PRN

## 2017-12-29 MED ORDER — MORPHINE SULFATE (PF) 4 MG/ML IV SOLN
4.0000 mg | Freq: Once | INTRAVENOUS | Status: DC
  Filled 2017-12-29: qty 1

## 2017-12-29 MED ORDER — TRAMADOL HCL 50 MG PO TABS
50.0000 mg | ORAL_TABLET | ORAL | Status: DC | PRN
  Administered 2017-12-29 – 2018-01-03 (×6): 50 mg via ORAL
  Filled 2017-12-29 (×6): qty 1

## 2017-12-29 MED ORDER — VANCOMYCIN HCL IN DEXTROSE 1-5 GM/200ML-% IV SOLN: 1000.0000 mg | Freq: Once | INTRAVENOUS | Status: DC

## 2017-12-29 MED ORDER — ACETAMINOPHEN 325 MG PO TABS
650.0000 mg | ORAL_TABLET | Freq: Four times a day (QID) | ORAL | Status: DC | PRN
  Administered 2017-12-30 – 2017-12-31 (×2): 650 mg via ORAL
  Filled 2017-12-29 (×2): qty 2

## 2017-12-29 MED ORDER — PIPERACILLIN-TAZOBACTAM 3.375 G IVPB 30 MIN
3.3750 g | Freq: Once | INTRAVENOUS | Status: AC
  Administered 2017-12-29: 3.375 g via INTRAVENOUS
  Filled 2017-12-29: qty 50

## 2017-12-29 MED ORDER — SERTRALINE HCL 25 MG PO TABS
12.5000 mg | ORAL_TABLET | Freq: Two times a day (BID) | ORAL | Status: DC
  Administered 2017-12-29 – 2018-01-03 (×10): 12.5 mg via ORAL
  Filled 2017-12-29 (×10): qty 1

## 2017-12-29 MED ORDER — VANCOMYCIN HCL 10 G IV SOLR
1500.0000 mg | Freq: Once | INTRAVENOUS | Status: AC
  Administered 2017-12-29: 1500 mg via INTRAVENOUS
  Filled 2017-12-29: qty 1500

## 2017-12-29 MED ORDER — BUPROPION HCL ER (XL) 150 MG PO TB24
450.0000 mg | ORAL_TABLET | Freq: Every day | ORAL | Status: DC
  Administered 2017-12-29 – 2018-01-03 (×6): 450 mg via ORAL
  Filled 2017-12-29 (×6): qty 3

## 2017-12-29 MED ORDER — SODIUM CHLORIDE 0.9 % IV SOLN
2.0000 g | INTRAVENOUS | Status: DC
  Administered 2017-12-29: 2 g via INTRAVENOUS
  Filled 2017-12-29: qty 20

## 2017-12-29 MED ORDER — ACETAMINOPHEN 500 MG PO TABS
1000.0000 mg | ORAL_TABLET | Freq: Four times a day (QID) | ORAL | Status: DC | PRN
  Administered 2017-12-30 – 2018-01-03 (×2): 1000 mg via ORAL
  Filled 2017-12-29 (×2): qty 2

## 2017-12-29 MED ORDER — ONDANSETRON HCL 4 MG PO TABS: 4.0000 mg | ORAL_TABLET | Freq: Four times a day (QID) | ORAL | Status: DC | PRN

## 2017-12-29 MED ORDER — LOSARTAN POTASSIUM 50 MG PO TABS
50.0000 mg | ORAL_TABLET | Freq: Every day | ORAL | Status: DC
  Administered 2017-12-30 – 2018-01-03 (×5): 50 mg via ORAL
  Filled 2017-12-29 (×5): qty 1

## 2017-12-29 MED ORDER — VANCOMYCIN HCL 500 MG IV SOLR
500.0000 mg | INTRAVENOUS | Status: DC
  Administered 2017-12-30 – 2017-12-31 (×2): 500 mg via INTRAVENOUS
  Filled 2017-12-29 (×4): qty 500

## 2017-12-29 NOTE — Progress Notes (Signed)
Pharmacy Antibiotic Note  Susan [name] is a 77 y.o. female admitted on 12/29/2017 with cellulitis.  Pharmacy has been consulted for vancomycin dosing. Pt is afebrile and WBC is WNL. SCr is 1.26 and lactic acid is <2.   Plan: Vancomycin 1500gm IV x 1 then 500mg  IV Q24H F/u renal fxn, C&S, clinical status and vanc peak/trough at SS  Height: 5' (152.4 cm) Weight: 188 lb 11.4 oz (85.6 kg) IBW/kg (Calculated) : 45.5  Temp (24hrs), Avg:98.3 F (36.8 C), Min:98.3 F (36.8 C), Max:98.3 F (36.8 C)  Recent Labs  Lab 12/23/17 0355 12/24/17 0541 12/25/17 0823  WBC 13.8* 8.8 6.9  CREATININE 1.66* 1.33* 1.11*    Estimated Creatinine Clearance: 39.9 mL/min (A) (by C-G formula based on SCr of 1.11 mg/dL (H)).    Allergies  Allergen Reactions  . Ace Inhibitors Cough  . Lipitor [Atorvastatin] Swelling  . Simvastatin Other (See Comments)    Myalgias     . Latex Itching    Antimicrobials this admission: Vanc 12/23>> CTX 12/23>>  Dose adjustments this admission: N/A  Microbiology results: Pending  Thank you for allowing pharmacy to be a part of this patient's care.  Tanicia Wolaver, Rande Lawman 12/29/2017 10:18 AM

## 2017-12-29 NOTE — ED Triage Notes (Signed)
Pt from [facility] via EMS; sent by facility for evaluation of fever and increased leg pain; pt admitted to [facility] this past Thursday after hospitalization for cellulitis of lower L leg; treated with vancomycin in hospital and PO amoxicillin at [facility]; pt c/o increased fever, pain, drainage, and swelling in lower L leg over the past couple of days; pt given 1 gram of tylenol by facility around 0445 this am  HR 70 BP 140/68 94% RA RR 16 129 CBG

## 2017-12-29 NOTE — ED Notes (Signed)
Got patient off the bedpan placed a perwick cath on patient is resting with call bell in reach and family at bedside

## 2017-12-29 NOTE — ED Provider Notes (Signed)
[facility] EMERGENCY DEPARTMENT Provider Note   CSN: [id number] Arrival date & time: 12/29/17  2595     History   Chief Complaint Chief Complaint  Patient presents with  . Leg Pain  . Fever    HPI Susan [name] is a 77 y.o. female.  HPI  77 year old female with a history of dementia and recent admission for cellulitis presents with worsening left leg redness and pain.  She was initially on IV vancomycin and discharged on amoxicillin.  She went to a rehab facility.  According to EMS she just finished the antibiotics.  However over the last couple days and at least today she has had a temperature up to 102.  Was given Tylenol prior to arrival by nursing home.  Patient states she has a chronic cough but no new cough or shortness of breath.  She denies any abdominal pain besides chronic pain at an umbilical hernia.  No urinary symptoms.  She states the pain in her leg is worsening.  Left leg wrapped by facility this morning and apparently was draining earlier.  Past Medical History:  Diagnosis Date  . Anxiety   . Arthritis    "legs, back" (07/29/2012)  . Cellulitis and abscess of leg 12/29/2017  . Chronic diastolic (congestive) heart failure ([place])   . Chronic lower back pain   . Complication of anesthesia    "I have apnea" (07/29/2012)  . 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 Family history of anesthesia complication    "some wake up during OR; some are hard to wake up; some both" (07/29/2012)  . GERD (gastroesophageal reflux disease)   . 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 ([place])   . History of CVA (cerebrovascular accident) 02/01/2015  . History of stomach ulcers 1980's  . Hyperlipidemia   . Hyperparathyroidism, primary ([place]) 04/22/2012  . Hypertensive heart disease   . Incontinent of urine    wears pads  . Major depressive disorder, recurrent episode, severe ([place]) 11/21/2015  . OSA on CPAP   . Osteoarthritis of right  knee   . Pedal edema   . Persistent atrial fibrillation    CHADS2VASC score is 7 - on chronic anticoagulation with Apixaban  . Spinal stenosis   . Umbilical hernia    "unrepaired" (07/29/2012)  . Varicose veins    "BLE" (07/29/2012)  . Vascular dementia [facility])     Patient Active Problem List   Diagnosis Date Noted  . Cellulitis and abscess of leg 12/29/2017  . Cellulitis of left lower extremity   . Ankle edema   . Sepsis ([place]) 12/21/2017  . Medication management 10/11/2016  . Hyperlipemia 06/18/2016  . Gait abnormality 06/18/2016  . Major depressive disorder, recurrent episode, severe ([place]) 11/21/2015  . Edema 02/09/2015  . History of CVA (cerebrovascular accident) 02/01/2015  . 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 ([place])   . 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 Chronic low back pain 01/28/2015  . Short-term memory loss 01/28/2015  . Volume depletion 01/28/2015  . Persistent atrial fibrillation 01/26/2015  . DCM (dilated cardiomyopathy) ([place]) 01/26/2015  . TIA (transient ischemic attack) 01/24/2015  . Chronic diastolic (congestive) heart failure ([place])   . Obesity (BMI 30-39.9) 01/27/2013  . Constipation 09/02/2012  . H/O arthroscopy of right knee 08/06/2012  . Anxiety   . Depression   . GERD (gastroesophageal reflux disease)   . Hypertensive heart disease   . OSA (obstructive  sleep apnea)   . Osteoarthritis of right knee   . Hyperparathyroidism, primary ([place]) 04/22/2012  . Chronic cough 10/02/2011    Past Surgical History:  Procedure Laterality Date  . CATARACT EXTRACTION    . CHOLECYSTECTOMY  ~ 2010  . COLONOSCOPY    . IUD REMOVAL  1980's  . PARATHYROIDECTOMY N/A 06/16/2012   Procedure: NECK EXPLORATION AND LEFT SUPERIOR PARATHYROIDECTOMY;  Surgeon: Earnstine Regal, MD;  Location: [facility];  Service: General;  Laterality: N/A;  . 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 TOTAL KNEE ARTHROPLASTY Right 07/29/2012   Procedure: TOTAL  KNEE ARTHROPLASTY;  Surgeon: Ninetta Lights, MD;  Location: [facility];  Service: Orthopedics;  Laterality: Right;     OB History    Gravida  3   Para  1   Term      Preterm      AB  0   Living  3     SAB      TAB      Ectopic      Multiple  0   Live Births               Home Medications    Prior to Admission medications   Medication Sig Start Date End Date Taking? Authorizing Provider  acetaminophen (TYLENOL) 500 MG tablet Take 2 tablets (1,000 mg total) by mouth every 6 (six) hours as needed for moderate pain. Take 1000 mg tablet twice daily in the morning and evening and take 1 tablet ( 500 mg) by mouth daily at 2 pm. Patient taking differently: Take 500-1,000 mg by mouth See admin instructions. Take 1000 mg tablet twice daily in the morning and evening and take 1 tablet ( 500 mg) by mouth daily at 2 pm. 09/25/17  Yes Ngetich, Dinah C, NP  apixaban (ELIQUIS) 5 MG TABS tablet Take 1 tablet (5 mg total) by mouth every 12 (twelve) hours. 01/26/15  Yes Rai, Ripudeep K, MD  buPROPion (WELLBUTRIN XL) 150 MG 24 hr tablet Take 450 mg by mouth daily.   Yes [provider]  Calcium Carbonate-Vitamin D 600-400 MG-UNIT tablet Take 1 tablet by mouth 2 (two) times daily. 12/22/17  Yes Lauree Chandler, NP  carvedilol (COREG) 12.5 MG tablet Take 12.5 mg by mouth 2 (two) times daily with a meal.   Yes [provider]  cholecalciferol (VITAMIN D) 1000 units tablet Take 1,000 Units by mouth daily.    Yes [provider]  cyclobenzaprine (FLEXERIL) 5 MG tablet Take 5 mg by mouth 3 (three) times daily as needed for muscle spasms.   Yes [provider]  diclofenac sodium (VOLTAREN) 1 % GEL Apply 2 g topically 4 (four) times daily. 10/07/17  Yes Leandrew Koyanagi, MD  docusate sodium (COLACE) 100 MG capsule Take 100 mg by mouth daily.   Yes [provider]  donepezil (ARICEPT) 10 MG tablet Take 1 tablet (10 mg total) by mouth at bedtime. For memory loss  11/17/17  Yes Eubanks, Carlos American, NP  furosemide (LASIX) 20 MG tablet Take 20 mg by mouth daily.   Yes [provider]  gabapentin (NEURONTIN) 300 MG capsule Take 300 mg by mouth 2 (two) times daily.    Yes [provider]  iron polysaccharides (IFEREX 150) 150 MG capsule Take 150 mg by mouth daily.   Yes [provider]  losartan (COZAAR) 50 MG tablet Take 1 tablet (50 mg total) by mouth daily. 08/22/16  Yes Fransico Him  R, MD  Menthol, Topical Analgesic, (BIOFREEZE) 4 % GEL Apply 3 oz topically 3 (three) times daily. For knee pain 09/25/17  Yes Ngetich, Dinah C, NP  Multiple Vitamin (MULTIVITAMIN WITH MINERALS) TABS Take 1 tablet by mouth daily.   Yes [provider]  pantoprazole (PROTONIX) 40 MG tablet Take 40 mg by mouth daily.   Yes [provider]  pravastatin (PRAVACHOL) 40 MG tablet Take 40 mg by mouth every evening.  11/01/15  Yes [provider]  saccharomyces boulardii (FLORASTOR) 250 MG capsule Take 1 capsule (250 mg total) by mouth 2 (two) times daily. 12/25/17  Yes Georgette Shell, MD  sertraline (ZOLOFT) 25 MG tablet Take 0.5 tablets (12.5 mg total) by mouth 2 (two) times daily. 08/15/17  Yes Eulas Post, Monica, DO  traMADol (ULTRAM) 50 MG tablet Take 1 tablet (50 mg total) by mouth every 6 (six) hours as needed. Patient taking differently: Take 50 mg by mouth every 4 (four) hours as needed for moderate pain.  12/25/17 12/25/18 Yes Georgette Shell, MD  vitamin B-12 (CYANOCOBALAMIN) 1000 MCG tablet Take 2,000 mcg by mouth daily.   Yes [provider]    Family History Family History  Problem Relation Age of Onset  . Hypertension Mother        deceased  . Stroke Mother   . Breast cancer Mother   . Lung cancer Father        deceased  . Diabetes Daughter   . Hypertension Daughter     Social History Social History   Tobacco Use  . Smoking status: Former Smoker    Packs/day: 1.00    Years: 30.00    Pack  years: 30.00    Types: Cigarettes    Last attempt to quit: 01/07/1978    Years since quitting: 40.0  . Smokeless tobacco: Never Used  Substance Use Topics  . Alcohol use: No  . Drug use: No     Allergies   Ace inhibitors; Lipitor [atorvastatin]; Simvastatin; and Latex   Review of Systems Review of Systems  Constitutional: Positive for fever.  Respiratory: Positive for cough. Negative for shortness of breath.   Cardiovascular: Positive for leg swelling.  Gastrointestinal: Negative for abdominal pain and vomiting.  Genitourinary: Negative for dysuria.  Musculoskeletal: Positive for myalgias.  All other systems reviewed and are negative.    Physical Exam Updated Vital Signs BP (!) 159/69   Pulse 76   Temp 98.3 F (36.8 C) (Rectal)   Resp 16   Ht 5' (1.524 m)   Wt 85.6 kg   SpO2 100%   BMI 36.86 kg/m   Physical Exam Vitals signs and nursing note reviewed.  Constitutional:      General: She is not in acute distress.    Appearance: She is well-developed. She is not ill-appearing or diaphoretic.  HENT:     Head: Normocephalic and atraumatic.     Right Ear: External ear normal.     Left Ear: External ear normal.     Nose: Nose normal.  Eyes:     General:        Right eye: No discharge.        Left eye: No discharge.  Cardiovascular:     Rate and Rhythm: Normal rate and regular rhythm.     Pulses:          Dorsalis pedis pulses are detected w/ Doppler on the left side.     Heart sounds: Normal heart sounds.  Pulmonary:  Effort: Pulmonary effort is normal.     Breath sounds: Normal breath sounds.  Abdominal:     Palpations: Abdomen is soft.     Tenderness: There is no abdominal tenderness.  Musculoskeletal:     Comments: LLE is very sensitive to mild touch.  There is diffuse swelling, shiny erythema and mild warmth from her left foot to just below her left knee.  On the medial aspect just above her ankle there is a small superficial ulceration.  No current  drainage but reportedly was draining at the facility.  Skin:    General: Skin is warm and dry.     Findings: Erythema present.  Neurological:     Mental Status: She is alert.  Psychiatric:        Mood and Affect: Mood is not anxious.        ED Treatments / Results  Labs (all labs ordered are listed, but only abnormal results are displayed) Labs Reviewed  COMPREHENSIVE METABOLIC PANEL - Abnormal; Notable for the following components:      Result Value   Glucose, Bld 117 (*)    Creatinine, Ser 1.26 (*)    Total Protein 6.0 (*)    Albumin 2.2 (*)    GFR calc non Af Amer 40 (*)    GFR calc Af Amer 47 (*)    All other components within normal limits  CBC WITH DIFFERENTIAL/PLATELET - Abnormal; Notable for the following components:   RBC 2.89 (*)    Hemoglobin 8.6 (*)    HCT 27.3 (*)    Monocytes Absolute 1.1 (*)    Abs Immature Granulocytes 0.25 (*)    All other components within normal limits  URINALYSIS, ROUTINE W REFLEX MICROSCOPIC - Abnormal; Notable for the following components:   Specific Gravity, Urine <1.005 (*)    All other components within normal limits  CULTURE, BLOOD (ROUTINE X 2)  CULTURE, BLOOD (ROUTINE X 2)  URINE CULTURE  I-STAT CG4 LACTIC ACID, ED    EKG EKG Interpretation  Date/Time:  Monday December 29 2017 10:15:15 EST Ventricular Rate:  69 PR Interval:    QRS Duration: 130 QT Interval:  385 QTC Calculation: 413 R Axis:   -48 Text Interpretation:  Sinus rhythm Atrial premature complexes Nonspecific IVCD with LAD Left ventricular hypertrophy Baseline wander in lead(s) III V2 V5 similar to Dec 21 2017 Confirmed by Sherwood Gambler [phone]) on 12/29/2017 10:35:40 AM   Radiology Ct Tibia Fibula Left W Contrast  Result Date: 12/29/2017 CLINICAL DATA:  Large wound on the distal aspect of the left lower leg and ankle. Subsequent encounter. EXAM: CT OF THE LOWER RIGHT EXTREMITY WITH CONTRAST TECHNIQUE: Multidetector CT imaging of the lower right  extremity was performed according to the standard protocol following intravenous contrast administration. COMPARISON:  CT left lower leg 12/25/2017. CONTRAST:  100 mL OMNIPAQUE IOHEXOL 300 MG/ML  SOLN FINDINGS: Bones/Joint/Cartilage No acute abnormality is identified. No bony destructive change or periosteal reaction. Osteophytosis about the knee consistent with degenerative disease is again seen. Ligaments Suboptimally assessed by CT. Muscles and Tendons Fat planes in muscle are preserved. No intramuscular fluid collection or gas tracking along fascial planes is identified. Scattered fatty deposits in muscles could be due to atrophy or less likely lipomas. Soft tissues Subcutaneous edema is present diffusely and appears worst distally. No focal fluid collection is identified. Scattered subcutaneous calcifications are most consistent with prior infectious or inflammatory process. IMPRESSION: No marked change in the appearance of the left lower leg. Diffuse  subcutaneous edema is worst distally and consistent with dependent change or cellulitis. Negative for abscess, osteomyelitis or necrotizing fasciitis. Electronically Signed   By: Inge Rise M.D.   On: 12/29/2017 13:58   Dg Chest Portable 1 View  Result Date: 12/29/2017 CLINICAL DATA:  Left lower extremity pain and swelling.  Fever. EXAM: PORTABLE CHEST 1 VIEW COMPARISON:  12/21/2017 FINDINGS: Stable cardiac enlargement and stable tortuosity and calcification of the thoracic aorta. Persistent central vascular congestion without overt pulmonary edema. No infiltrates or effusions. The bony thorax is intact. Remote surgical changes involving the right shoulder. IMPRESSION: Stable cardiac enlargement with central vascular congestion but no infiltrates, edema or effusions. Electronically Signed   By: Marijo Sanes M.D.   On: 12/29/2017 10:56   Dg Tibia/fibula Left Port  Result Date: 12/29/2017 CLINICAL DATA:  Left lower extremity pain and swelling. EXAM:  PORTABLE LEFT TIBIA AND FIBULA - 2 VIEW COMPARISON:  CT scan 12/25/2017 FINDINGS: Moderate degenerative changes involving the knee and ankle but no acute bony findings or destructive bony lesions. Small lucency noted in the mid tibia medially has the appearance of a nutrient vessel on the recent prior CT scan. Significant subcutaneous soft tissue swelling/edema/fluid. There are also extensive calcifications noted in the subcutaneous fat possibly reflecting chronic venous stasis, chronic cellulitis or process such as dermatomyositis. IMPRESSION: No acute bony findings. Diffuse subcutaneous soft tissue swelling/edema/fluid. Electronically Signed   By: Marijo Sanes M.D.   On: 12/29/2017 11:01    Procedures Procedures (including critical care time)  Medications Ordered in ED Medications  vancomycin (VANCOCIN) 500 mg in sodium chloride 0.9 % 100 mL IVPB (has no administration in time range)  acetaminophen (TYLENOL) tablet 1,000 mg (has no administration in time range)  traMADol (ULTRAM) tablet 50 mg (has no administration in time range)  carvedilol (COREG) tablet 12.5 mg (12.5 mg Oral Given 12/29/17 1633)  furosemide (LASIX) tablet 20 mg (has no administration in time range)  losartan (COZAAR) tablet 50 mg (has no administration in time range)  pravastatin (PRAVACHOL) tablet 40 mg (40 mg Oral Given 12/29/17 1633)  buPROPion (WELLBUTRIN XL) 24 hr tablet 450 mg (450 mg Oral Given 12/29/17 1633)  donepezil (ARICEPT) tablet 10 mg (has no administration in time range)  sertraline (ZOLOFT) tablet 12.5 mg (has no administration in time range)  pantoprazole (PROTONIX) EC tablet 40 mg (has no administration in time range)  apixaban (ELIQUIS) tablet 5 mg (has no administration in time range)  iron polysaccharides (NIFEREX) capsule 150 mg (has no administration in time range)  cyclobenzaprine (FLEXERIL) tablet 5 mg (has no administration in time range)  gabapentin (NEURONTIN) capsule 300 mg (has no  administration in time range)  acetaminophen (TYLENOL) tablet 650 mg (has no administration in time range)    Or  acetaminophen (TYLENOL) suppository 650 mg (has no administration in time range)  docusate sodium (COLACE) capsule 100 mg (100 mg Oral Given 12/29/17 1633)  ondansetron (ZOFRAN) tablet 4 mg (has no administration in time range)    Or  ondansetron (ZOFRAN) injection 4 mg (has no administration in time range)  piperacillin-tazobactam (ZOSYN) IVPB 3.375 g (3.375 g Intravenous New Bag/Given 12/29/17 1633)  lactated ringers infusion ( Intravenous New Bag/Given 12/29/17 1631)  morphine 2 MG/ML injection 2 mg (has no administration in time range)  piperacillin-tazobactam (ZOSYN) IVPB 3.375 g (has no administration in time range)  vancomycin (VANCOCIN) 1,500 mg in sodium chloride 0.9 % 500 mL IVPB (0 mg Intravenous Stopped 12/29/17 1413)  iohexol (OMNIPAQUE) 300  MG/ML solution 100 mL (100 mLs Intravenous Contrast Given 12/29/17 1304)     Initial Impression / Assessment and Plan / ED Course  I have reviewed the triage vital signs and the nursing notes.  Pertinent labs & imaging results that were available during my care of the patient were reviewed by me and considered in my medical decision making (see chart for details).     Given worsening cellulitis and significant pain/tenderness, CT obtained. No obvious deep infection/abscess. Will do IV antibiotics. Febrile at facility but not here, no sepsis. Admit to hospitalist service.   Final Clinical Impressions(s) / ED Diagnoses   Final diagnoses:  Left leg cellulitis    ED Discharge Orders    None       Sherwood Gambler, MD 12/29/17 1701

## 2017-12-29 NOTE — ED Notes (Signed)
Sent off urine but unable to get a culture because there are none in the department.

## 2017-12-29 NOTE — Progress Notes (Signed)
Pharmacy Antibiotic Note  Susan [name] is a 77 y.o. female admitted on 12/29/2017 with LLE  cellulitis.  Pharmacy has been consulted for Vancomycin dosing this morning.  Vancomycin 1500 mg IV given at ~12n. Also received Ceftriaxone 2gm IV  ~11am, now changing Ceftriaxone to Zosyn.    Recent admission 12/15-12/19.  Zosyn x 1 given on 12/15, and Vanc 12/15-12/18 pm, then discharged on Augmentin x 7 days.  Plan:  Zosyn 3.375 gm IV x 1 on 30 minutes already ordered by MD.  Continue Zosyn with 3.75 gm IV q8hrs (each over 4 hours)  Vancomycin 500 mg IV q24hrs to begin on 12/24, as previously ordered.  Follow renal function, culture data, clinical progress and antibiotic plans.  Height: 5' (152.4 cm) Weight: 188 lb 11.4 oz (85.6 kg) IBW/kg (Calculated) : 45.5  Temp (24hrs), Avg:98.4 F (36.9 C), Min:98.3 F (36.8 C), Max:98.5 F (36.9 C)  Recent Labs  Lab 12/23/17 0355 12/24/17 0541 12/25/17 0823 12/29/17 1025 12/29/17 1041  WBC 13.8* 8.8 6.9 9.5  --   CREATININE 1.66* 1.33* 1.11* 1.26*  --   LATICACIDVEN  --   --   --   --  1.21    Estimated Creatinine Clearance: 35.1 mL/min (A) (by C-G formula based on SCr of 1.26 mg/dL (H)).    Allergies  Allergen Reactions  . Ace Inhibitors Cough  . Lipitor [Atorvastatin] Swelling  . Simvastatin Other (See Comments)    Myalgias     . Latex Itching    Antimicrobials this admission:  Vancomycin 12/23>>  Ceftriaxone 2gm  x 1 on 12/23  Zosyn 12/23>>  Dose adjustments this admission:  n/a  Microbiology results:  12/23 blood x 2 -  12/23 urine -   - recent admission, 12/15 blood x 2 - negative  Thank you for allowing pharmacy to be a part of this patient's care.  Arty Baumgartner, [place] Pager: [phone] or phone: [phone] 12/29/2017 4:16 PM

## 2017-12-29 NOTE — H&P (Addendum)
History and Physical    Susan [name]:[id number] DOB: August 03, 1940 DOA: 12/29/2017  PCP: Lauree Chandler, NP Consultants:  Erlinda Hong - orthopedics; Dellis Filbert - GYN; Radford Pax - cardiology; Leonie Man - neurology Patient coming from: [facility]; NOK: Daughter, [phone]  Chief Complaint: LLE infection  HPI: Susan [name] is a 77 y.o. female with medical history significant of CVA; vascular dementia; afib; OSA on CPAP; depression; primary hyperparathyroidism; HLD; chronic back pain; and chronic diastolic CHF presenting with worsening leg pain/symptoms.  She thought "I was on the mend but I wasn't.  It wasn't through any fault of my own."  The patient never thought she was getting better - the pain was still present and seemed to be worsening and so she was concerned about this.  Her wound was fine Saturday, according to her daughter.  Someone placed a bandage on it and it burst and started oozing.  Then, it turned real ugly.  They were not able to see a doctor on the weekend and "there were no orders for her treatment."  She had a PT assessment and family is not aware of that plan.  She was shown exercises in the bed  "but not really walking ones."   She is having difficulty with her speech again - she does not have this chronically but has recurrent aphasia not which she had during her last hospitalization as well.  Her temp was normal at d/c but her fever was 102 overnight.  She was admitted from 12/15-19 for sepsis associated with LLE cellulitis.    ED Course:  Cellulitis - recently discharged.  Given Vanc then Amox.  Has wound on the medial leg, shallow and draining purulent fluid. + fever.  No deep infection on CT.  Never resolved and then recurred.  No evidence of sepsis.  Review of Systems: As per HPI; otherwise review of systems reviewed and negative.   Ambulatory Status:   Ambulated with a walker prior to last hospitalization  Past Medical History:  Diagnosis Date  . Anxiety   . Arthritis      "legs, back" (07/29/2012)  . Cellulitis and abscess of leg 12/29/2017  . Chronic diastolic (congestive) heart failure ([place])   . Chronic lower back pain   . Complication of anesthesia    "I have apnea" (07/29/2012)  . 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 Family history of anesthesia complication    "some wake up during OR; some are hard to wake up; some both" (07/29/2012)  . GERD (gastroesophageal reflux disease)   . 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 ([place])   . History of CVA (cerebrovascular accident) 02/01/2015  . History of stomach ulcers 1980's  . Hyperlipidemia   . Hyperparathyroidism, primary ([place]) 04/22/2012  . Hypertensive heart disease   . Incontinent of urine    wears pads  . Major depressive disorder, recurrent episode, severe ([place]) 11/21/2015  . OSA on CPAP   . Osteoarthritis of right knee   . Pedal edema   . Persistent atrial fibrillation    CHADS2VASC score is 7 - on chronic anticoagulation with Apixaban  . Spinal stenosis   . Umbilical hernia    "unrepaired" (07/29/2012)  . Varicose veins    "BLE" (07/29/2012)  . Vascular dementia [facility])     Past Surgical History:  Procedure Laterality Date  . CATARACT EXTRACTION    . CHOLECYSTECTOMY  ~ 2010  . COLONOSCOPY    . IUD REMOVAL  1980's  .  PARATHYROIDECTOMY N/A 06/16/2012   Procedure: NECK EXPLORATION AND LEFT SUPERIOR PARATHYROIDECTOMY;  Surgeon: Earnstine Regal, MD;  Location: [facility];  Service: General;  Laterality: N/A;  . 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 TOTAL KNEE ARTHROPLASTY Right 07/29/2012   Procedure: TOTAL KNEE ARTHROPLASTY;  Surgeon: Ninetta Lights, MD;  Location: [facility];  Service: Orthopedics;  Laterality: Right;    Social History   Socioeconomic History  . Marital status: Widowed    Spouse name: Not on file  . Number of children: Not on file  . Years of education: Not on file  . Highest education level: Not on file  Occupational History  .  Occupation: retired    Fish farm manager: RETIRED  Social Needs  . Financial resource strain: Not hard at all  . Food insecurity:    Worry: Never true    Inability: Never true  . Transportation needs:    Medical: No    Non-medical: No  Tobacco Use  . Smoking status: Former Smoker    Packs/day: 1.00    Years: 30.00    Pack years: 30.00    Types: Cigarettes    Last attempt to quit: 01/07/1978    Years since quitting: 40.0  . Smokeless tobacco: Never Used  Substance and Sexual Activity  . Alcohol use: No  . Drug use: No  . Sexual activity: Not Currently    Comment: intercourse age 73,less than 5 secxual partners,  Lifestyle  . Physical activity:    Days per week: 3 days    Minutes per session: 30 min  . Stress: Rather much  Relationships  . Social connections:    Talks on phone: More than three times a week    Gets together: More than three times a week    Attends religious service: More than 4 times per year    Active member of club or organization: No    Attends meetings of clubs or organizations: Never    Relationship status: Widowed  . Intimate partner violence:    Fear of current or ex partner: No    Emotionally abused: No    Physically abused: No    Forced sexual activity: No  Other Topics Concern  . Not on file  Social History Narrative   Lives at home alone   Right-handed   Caffeine: 1 cup of coffee per day         Diet:      Caffeine:      Married, if yes what year:       Do you live in a house, apartment, assisted living, condo, trailer, ect:      Pets:      Current/Past profession:      Exercise:         Living Will: Yes   DNR: No   POA/HPOA: Yes      Functional Status:   Do you have difficulty bathing or dressing yourself? Yes   Do you have difficulty preparing food or eating? No   Do you have difficulty managing your medications? Yes   Do you have difficulty managing your finances? Yes   Do you have difficulty affording your medications? Yes     Allergies  Allergen Reactions  . Ace Inhibitors Cough  . Lipitor [Atorvastatin] Swelling  . Simvastatin Other (See Comments)    Myalgias     . Latex Itching    Family History  Problem Relation Age of Onset  . Hypertension Mother  deceased  . Stroke Mother   . Breast cancer Mother   . Lung cancer Father        deceased  . Diabetes Daughter   . Hypertension Daughter     Prior to Admission medications   Medication Sig Start Date End Date Taking? Authorizing Provider  acetaminophen (TYLENOL) 500 MG tablet Take 2 tablets (1,000 mg total) by mouth every 6 (six) hours as needed for moderate pain. Take 1000 mg tablet twice daily in the morning and evening and take 1 tablet ( 500 mg) by mouth daily at 2 pm. Patient taking differently: Take 500-1,000 mg by mouth See admin instructions. Take 1000 mg tablet twice daily in the morning and evening and take 1 tablet ( 500 mg) by mouth daily at 2 pm. 09/25/17  Yes Ngetich, Dinah C, NP  acetaminophen (TYLENOL) 500 MG tablet Take 500 mg by mouth every 6 (six) hours as needed for moderate pain.   Yes [provider]  amoxicillin-clavulanate (AUGMENTIN) 875-125 MG tablet Take 1 tablet by mouth 2 (two) times daily for 7 days. 12/25/17 01/01/18 Yes Georgette Shell, MD  apixaban (ELIQUIS) 5 MG TABS tablet Take 1 tablet (5 mg total) by mouth every 12 (twelve) hours. 01/26/15  Yes Rai, Ripudeep K, MD  buPROPion (WELLBUTRIN XL) 150 MG 24 hr tablet Take 450 mg by mouth daily.   Yes [provider]  Calcium Carbonate-Vitamin D 600-400 MG-UNIT tablet Take 1 tablet by mouth 2 (two) times daily. 12/22/17  Yes Lauree Chandler, NP  carvedilol (COREG) 12.5 MG tablet Take 12.5 mg by mouth 2 (two) times daily with a meal.   Yes [provider]  cholecalciferol (VITAMIN D) 1000 units tablet Take 1,000 Units by mouth daily.    Yes [provider]  cyclobenzaprine (FLEXERIL) 5 MG tablet Take 5 mg by mouth 3 (three)  times daily as needed for muscle spasms.   Yes [provider]  diclofenac sodium (VOLTAREN) 1 % GEL Apply 2 g topically 4 (four) times daily. 10/07/17  Yes Leandrew Koyanagi, MD  docusate sodium (COLACE) 100 MG capsule Take 100 mg by mouth daily.   Yes [provider]  donepezil (ARICEPT) 10 MG tablet Take 1 tablet (10 mg total) by mouth at bedtime. For memory loss 11/17/17  Yes Eubanks, Carlos American, NP  furosemide (LASIX) 20 MG tablet Take 20 mg by mouth daily.   Yes [provider]  gabapentin (NEURONTIN) 300 MG capsule Take 300 mg by mouth 2 (two) times daily.    Yes [provider]  iron polysaccharides (IFEREX 150) 150 MG capsule Take 150 mg by mouth daily.   Yes [provider]  losartan (COZAAR) 50 MG tablet Take 1 tablet (50 mg total) by mouth daily. 08/22/16  Yes Turner, Eber Hong, MD  Menthol, Topical Analgesic, (BIOFREEZE) 4 % GEL Apply 3 oz topically 3 (three) times daily. For knee pain 09/25/17  Yes Ngetich, Dinah C, NP  Multiple Vitamin (MULTIVITAMIN WITH MINERALS) TABS Take 1 tablet by mouth daily.   Yes [provider]  pantoprazole (PROTONIX) 40 MG tablet Take 40 mg by mouth daily.   Yes [provider]  pravastatin (PRAVACHOL) 40 MG tablet Take 40 mg by mouth every evening.  11/01/15  Yes [provider]  saccharomyces boulardii (FLORASTOR) 250 MG capsule Take 1 capsule (250 mg total) by mouth 2 (two) times daily. 12/25/17  Yes Georgette Shell, MD  sertraline (ZOLOFT) 25 MG tablet Take 0.5  tablets (12.5 mg total) by mouth 2 (two) times daily. 08/15/17  Yes Eulas Post, Monica, DO  traMADol (ULTRAM) 50 MG tablet Take 1 tablet (50 mg total) by mouth every 6 (six) hours as needed. Patient taking differently: Take 50 mg by mouth every 4 (four) hours as needed for moderate pain.  12/25/17 12/25/18 Yes Georgette Shell, MD  vitamin B-12 (CYANOCOBALAMIN) 1000 MCG tablet Take 2,000 mcg by mouth daily.   Yes [provider]    Physical Exam: Vitals:   12/29/17 1400 12/29/17 1430 12/29/17 1500 12/29/17 1633  BP: (!) 146/69 (!) 158/71 (!) 159/69   Pulse: (!) 57 (!) 57 (!) 59 76  Resp: 13 14 16    Temp:      TempSrc:      SpO2: 95% 96% 100%   Weight:      Height:         General:  Appears calm and comfortable and is NAD Eyes:  PERRL, EOMI, normal lids, iris ENT:  grossly normal hearing, lips & tongue, mmm Neck:  no LAD, masses or thyromegaly Cardiovascular:  RRR, no m/r/g. No LE edema.  Respiratory:   CTA bilaterally with no wheezes/rales/rhonchi.  Normal respiratory effort. Abdomen:  soft, NT, ND, NABS Skin: LLE medial ankle excoriation with some purulence but no apparent fluctuance.  There is surrounding erythema with edema up to the knee.       Musculoskeletal:  grossly normal tone BUE/BLE, good ROM, no bony abnormality Psychiatric:  grossly normal mood and affect, speech with some expressive aphasia, AOx3 Neurologic:  CN 2-12 grossly intact, moves all extremities in coordinated fashion    Radiological Exams on Admission: Ct Tibia Fibula Left W Contrast  Result Date: 12/29/2017 CLINICAL DATA:  Large wound on the distal aspect of the left lower leg and ankle. Subsequent encounter. EXAM: CT OF THE LOWER RIGHT EXTREMITY WITH CONTRAST TECHNIQUE: Multidetector CT imaging of the lower right extremity was performed according to the standard protocol following intravenous contrast administration. COMPARISON:  CT left lower leg 12/25/2017. CONTRAST:  100 mL OMNIPAQUE IOHEXOL 300 MG/ML  SOLN FINDINGS: Bones/Joint/Cartilage No acute abnormality is identified. No bony destructive change or periosteal reaction. Osteophytosis about the knee consistent with degenerative disease is again seen. Ligaments Suboptimally assessed by CT. Muscles and Tendons Fat planes in muscle are preserved. No intramuscular fluid collection or gas tracking along fascial planes is identified. Scattered fatty deposits  in muscles could be due to atrophy or less likely lipomas. Soft tissues Subcutaneous edema is present diffusely and appears worst distally. No focal fluid collection is identified. Scattered subcutaneous calcifications are most consistent with prior infectious or inflammatory process. IMPRESSION: No marked change in the appearance of the left lower leg. Diffuse subcutaneous edema is worst distally and consistent with dependent change or cellulitis. Negative for abscess, osteomyelitis or necrotizing fasciitis. Electronically Signed   By: Inge Rise M.D.   On: 12/29/2017 13:58   Dg Chest Portable 1 View  Result Date: 12/29/2017 CLINICAL DATA:  Left lower extremity pain and swelling.  Fever. EXAM: PORTABLE CHEST 1 VIEW COMPARISON:  12/21/2017 FINDINGS: Stable cardiac enlargement and stable tortuosity and calcification of the thoracic aorta. Persistent central vascular congestion without overt pulmonary edema. No infiltrates or effusions. The bony thorax is intact. Remote surgical changes involving the right shoulder. IMPRESSION: Stable cardiac enlargement with central vascular congestion but no infiltrates, edema or effusions. Electronically Signed   By: Marijo Sanes M.D.   On: 12/29/2017 10:56   Dg Tibia/fibula  Left Port  Result Date: 12/29/2017 CLINICAL DATA:  Left lower extremity pain and swelling. EXAM: PORTABLE LEFT TIBIA AND FIBULA - 2 VIEW COMPARISON:  CT scan 12/25/2017 FINDINGS: Moderate degenerative changes involving the knee and ankle but no acute bony findings or destructive bony lesions. Small lucency noted in the mid tibia medially has the appearance of a nutrient vessel on the recent prior CT scan. Significant subcutaneous soft tissue swelling/edema/fluid. There are also extensive calcifications noted in the subcutaneous fat possibly reflecting chronic venous stasis, chronic cellulitis or process such as dermatomyositis. IMPRESSION: No acute bony findings. Diffuse subcutaneous soft  tissue swelling/edema/fluid. Electronically Signed   By: Marijo Sanes M.D.   On: 12/29/2017 11:01    EKG: Independently reviewed.  NSR with rate 69; IVCD and LVH; nonspecific ST changes with no evidence of acute ischemia   Labs on Admission: I have personally reviewed the available labs and imaging studies at the time of the admission.  Pertinent labs:   Glucose 117 BUN 13/Creatinine 1.26/GFR 47  Albumin 2.2 Lactate 1.21 WBC 9.5 Hgb 8.6 Normal CBC  Assessment/Plan Principal Problem:   Cellulitis and abscess of leg Active Problems:   Hypertensive heart disease   Chronic diastolic (congestive) heart failure (HCC)   Persistent atrial fibrillation   History of CVA (cerebrovascular accident)   CKD (chronic kidney disease) stage 3, GFR 30-59 ml/min (HCC)    Cellulitis -Patient with h/o recent admission for cellulitis, with imrpovement on Vanc, discharged on Augmentin, and with recurrent edema/erythema -Careful inspection of the skin does not apparent abscess and CT does not indicate abscess formation -Normal WBC count, normal lactate -She was given Vanc and Rocephin in the ER -Given recent admission with recurrent symptoms, will cover with Vanc and Zosyn for now -She does not appear to have sepsis physiology at this time -Wound care consult  Expressive aphasia, vascular dementia s/p CVA -She tends to have mild exacerbation of symptoms in the setting of acute infection -Suspect CVA recrudescence -Will monitor for now without further evaluation/treatment -Continue Aricept  Chronic diastolic CHF -Appears to be stable at this time, euvolemic on exam -4/17 Echo with preserved EF -Continue Lasix, Coreg, Cozaar  Afib -Rate controlled on Coreg -Continue Eliquis  HTN -Continue Coreg, Cozaar  Stage 3 CKD -Appears to be at/near baseline -Recheck BMP tomorrow  DVT prophylaxis: Eliquis Code Status:  Full - confirmed with patient/family Family Communication: Daughter  present throughout evaluation  Disposition Plan:  Home once clinically improved Consults called: Wound care; SW  Admission status: It is my clinical opinion that referral for OBSERVATION is reasonable and necessary in this patient based on the above information provided. The aforementioned taken together are felt to place the patient at high risk for further clinical deterioration. However it is anticipated that the patient may be medically stable for discharge from the hospital within 24 to 48 hours.    Karmen Bongo MD [facility]  If note is complete, please contact covering daytime or nighttime physician. [email or web address] Password [facility]  12/29/2017, 5:32 PM

## 2017-12-30 DIAGNOSIS — K429 Umbilical hernia without obstruction or gangrene: Secondary | ICD-10-CM | POA: Diagnosis present

## 2017-12-30 DIAGNOSIS — D631 Anemia in chronic kidney disease: Secondary | ICD-10-CM | POA: Diagnosis present

## 2017-12-30 DIAGNOSIS — I4819 Other persistent atrial fibrillation: Secondary | ICD-10-CM | POA: Diagnosis present

## 2017-12-30 DIAGNOSIS — E785 Hyperlipidemia, unspecified: Secondary | ICD-10-CM | POA: Diagnosis present

## 2017-12-30 DIAGNOSIS — F0150 Vascular dementia without behavioral disturbance: Secondary | ICD-10-CM | POA: Diagnosis present

## 2017-12-30 DIAGNOSIS — M4800 Spinal stenosis, site unspecified: Secondary | ICD-10-CM | POA: Diagnosis present

## 2017-12-30 DIAGNOSIS — I130 Hypertensive heart and chronic kidney disease with heart failure and stage 1 through stage 4 chronic kidney disease, or unspecified chronic kidney disease: Secondary | ICD-10-CM | POA: Diagnosis present

## 2017-12-30 DIAGNOSIS — I69359 Hemiplegia and hemiparesis following cerebral infarction affecting unspecified side: Secondary | ICD-10-CM | POA: Diagnosis not present

## 2017-12-30 DIAGNOSIS — K219 Gastro-esophageal reflux disease without esophagitis: Secondary | ICD-10-CM | POA: Diagnosis present

## 2017-12-30 DIAGNOSIS — F332 Major depressive disorder, recurrent severe without psychotic features: Secondary | ICD-10-CM | POA: Diagnosis present

## 2017-12-30 DIAGNOSIS — R32 Unspecified urinary incontinence: Secondary | ICD-10-CM | POA: Diagnosis present

## 2017-12-30 DIAGNOSIS — E210 Primary hyperparathyroidism: Secondary | ICD-10-CM | POA: Diagnosis present

## 2017-12-30 DIAGNOSIS — I69322 Dysarthria following cerebral infarction: Secondary | ICD-10-CM | POA: Diagnosis not present

## 2017-12-30 DIAGNOSIS — L03116 Cellulitis of left lower limb: Secondary | ICD-10-CM | POA: Diagnosis present

## 2017-12-30 DIAGNOSIS — G4733 Obstructive sleep apnea (adult) (pediatric): Secondary | ICD-10-CM | POA: Diagnosis present

## 2017-12-30 DIAGNOSIS — R4701 Aphasia: Secondary | ICD-10-CM | POA: Diagnosis present

## 2017-12-30 DIAGNOSIS — G8929 Other chronic pain: Secondary | ICD-10-CM | POA: Diagnosis present

## 2017-12-30 DIAGNOSIS — N183 Chronic kidney disease, stage 3 (moderate): Secondary | ICD-10-CM | POA: Diagnosis present

## 2017-12-30 DIAGNOSIS — L03119 Cellulitis of unspecified part of limb: Secondary | ICD-10-CM | POA: Diagnosis not present

## 2017-12-30 DIAGNOSIS — F419 Anxiety disorder, unspecified: Secondary | ICD-10-CM | POA: Diagnosis present

## 2017-12-30 DIAGNOSIS — M545 Low back pain: Secondary | ICD-10-CM | POA: Diagnosis present

## 2017-12-30 DIAGNOSIS — I420 Dilated cardiomyopathy: Secondary | ICD-10-CM | POA: Diagnosis present

## 2017-12-30 DIAGNOSIS — I5032 Chronic diastolic (congestive) heart failure: Secondary | ICD-10-CM | POA: Diagnosis present

## 2017-12-30 DIAGNOSIS — L02416 Cutaneous abscess of left lower limb: Secondary | ICD-10-CM | POA: Diagnosis present

## 2017-12-30 DIAGNOSIS — L02419 Cutaneous abscess of limb, unspecified: Secondary | ICD-10-CM | POA: Diagnosis not present

## 2017-12-30 DIAGNOSIS — E892 Postprocedural hypoparathyroidism: Secondary | ICD-10-CM | POA: Diagnosis present

## 2017-12-30 DIAGNOSIS — I110 Hypertensive heart disease with heart failure: Secondary | ICD-10-CM | POA: Diagnosis not present

## 2017-12-30 LAB — CBC
HCT: 24.8 % — ABNORMAL LOW (ref 36.0–46.0)
Hemoglobin: 8 g/dL — ABNORMAL LOW (ref 12.0–15.0)
MCH: 29.4 pg (ref 26.0–34.0)
MCHC: 32.3 g/dL (ref 30.0–36.0)
MCV: 91.2 fL (ref 80.0–100.0)
Platelets: 369 10*3/uL (ref 150–400)
RBC: 2.72 MIL/uL — ABNORMAL LOW (ref 3.87–5.11)
RDW: 13.9 % (ref 11.5–15.5)
WBC: 9.5 10*3/uL (ref 4.0–10.5)
nRBC: 0 % (ref 0.0–0.2)

## 2017-12-30 LAB — URINE CULTURE: CULTURE: NO GROWTH

## 2017-12-30 LAB — BASIC METABOLIC PANEL
Anion gap: 11 (ref 5–15)
BUN: 14 mg/dL (ref 8–23)
CHLORIDE: 100 mmol/L (ref 98–111)
CO2: 24 mmol/L (ref 22–32)
Calcium: 9.5 mg/dL (ref 8.9–10.3)
Creatinine, Ser: 1.3 mg/dL — ABNORMAL HIGH (ref 0.44–1.00)
GFR calc Af Amer: 45 mL/min — ABNORMAL LOW (ref >60)
GFR calc non Af Amer: 39 mL/min — ABNORMAL LOW (ref >60)
Glucose, Bld: 100 mg/dL — ABNORMAL HIGH (ref 70–99)
Potassium: 3.7 mmol/L (ref 3.5–5.1)
SODIUM: 135 mmol/L (ref 135–145)

## 2017-12-30 MED ORDER — GUAIFENESIN-DM 100-10 MG/5ML PO SYRP
5.0000 mL | ORAL_SOLUTION | ORAL | Status: DC | PRN
  Administered 2017-12-30: 5 mL via ORAL
  Filled 2017-12-30 (×2): qty 5

## 2017-12-30 MED ORDER — WHITE PETROLATUM EX OINT
TOPICAL_OINTMENT | CUTANEOUS | Status: AC
  Administered 2017-12-31: 01:00:00
  Filled 2017-12-30: qty 28.35

## 2017-12-30 NOTE — Progress Notes (Signed)
RT offered pt cpap for the night pt declined for the night. RT will continue to monitor.

## 2017-12-30 NOTE — Consult Note (Addendum)
[facility] Nurse wound consult note Reason for Consult: Consult requested for left ankle.  Pt has generalized edema and erythremia to left leg related to cellulitis which has been marked and has been on systemic antibiotics. Wound type: Left inner ankle with full thickness wound; 9X3X.2cm Wound bed: yellow and dry Drainage (amount, consistency, odor) No odor, drainage, or fluctuance Periwound: intact skin surrounding Dressing procedure/placement/frequency: Xeroform gauze to promote healing. Discussed plan of care with patient, no family members present. Please re-consult if further assistance is needed.  Thank-you,  Julien Girt MSN, [place], [place], [place], [place]

## 2017-12-30 NOTE — Progress Notes (Signed)
PROGRESS NOTE    Susan [name]  IWP:[id number] DOB: 1940-04-10 DOA: 12/29/2017 PCP: Lauree Chandler, NP   Brief Narrative: 77 year old female with history of CVA, vascular dementia, A. fib, OSA on CPAP, depression, primary hyperparathyroidism, hyperlipidemia, chronic back pain, chronic diastolic CHF comes to the ER with complaint of worsening left leg redness, pain.  Per family someone placed a Bandage on her woulnd on Left leg at SNF and it burst and started oozing, and they were not able to see doctor on the weekend. Patient was recently admitted from 12/15 to 12/19 for sepsis due to left lower extremity cellulitis along with acute metabolic encephalopathy, was discharged on Augmentin for 7 days.  In the ER, found to have cellulitis, with purulent fluid draining from the left medial leg, was febrile, vitals otherwise stable, lactic acid normal.Patient was given vancomycin and was admitted.  Assessment & Plan:   Cellulitisof left lower leg: Recurrent infection, did not improve on oral Augmentin-failed outpatient therapy.CT without evidence of any abscess, on exam no purulent drainage noted, unable to send for wound culture.  Placed on vancomycin and Zosyn given her recurrent infection/admission. Blood culture sent and in process.  Cellulitis seems to be improving on current antibiotics.  Patient had T-max of 101.7 this morning. Continue on the current antibiotics IV since patient failed outpatient therapy (augmentin).  Chronic diastolic CHF: Compensated.  Recent echo 4/17 with preserved EF,cont lasix, coreg, cozaar.  Paroxysmal atrial fibrillation : EKG reviewed, in normal sinus rhythm.  Home Coreg and Eliquis for stroke prophylaxis.  Anemia of chronic renal disease: Hemoglobin stable.Monitor.  History of CVA/vascular dementia/?  Expressive aphasia on admission.  Patient is currently at baseline.  Continue her Eliquis.  Continue Aricept, supportive care fall precaution.  CKD stage 3, GFR  30-59 ml/min:Creatinine at baseline  Essential HTN : BP borderline, cont Coreg and losartan.  Change the patient to inpatient Status given her cellulitis that has failed outpatient Augmentin.  Patient will need at least 2 midnight to manage her cellulitis.  DVT prophylaxis: Eliquis Code Status: Full code Family Communication: Daughter at the bedside.  Family is very adamant that patient needs to be watched on oral antibiotics prior to discharge, given her recurrent admission  Disposition Plan: Plan on return to [facility] once cellulitis better, we can plan switching to p.o. antibiotics tomorrow if culture data comes back negative and patient remains afebrile.   Consultants: none  Procedures: none  Antimicrobials: Vancomycin/Zosyn  Subjective: Patient resting comfortably.  She reports her left leg is less painful today.  Fever of 101.7 this morning. No chest pain nausea vomiting. Daughter at the bedside with multiple questions :" Is this flesh eating bacteria infection?"   Objective: Vitals:   12/29/17 1633 12/30/17 0227 12/30/17 0533 12/30/17 0828  BP:  (!) 174/72 (!) 146/64 (!) 154/87  Pulse: 76 78  83  Resp:  18 18 18   Temp:  (!) 101.7 F (38.7 C) 98.9 F (37.2 C) 98.4 F (36.9 C)  TempSrc:  Rectal Oral Oral  SpO2:  92%  94%  Weight:      Height:        Intake/Output Summary (Last 24 hours) at 12/30/2017 1323 Last data filed at 12/30/2017 0544 Gross per 24 hour  Intake 1281.3 ml  Output 800 ml  Net 481.3 ml   Filed Weights   12/29/17 1011  Weight: 85.6 kg   Weight change:   Body mass index is 36.86 kg/m.  Examination:  General exam: Calm,  comfortable, not in acute distress,average built, elderly and frail-appearing.  HEENT:Oral mucosa moist, Ear/Nose WNL grossly, dentition normal. Respiratory system: Bilateral equal air entry, no crackles and wheezing, no use of accessory muscle. Cardiovascular system: regular rate and rhythm, S1 & S2 heard, No  JVD/murmurs.No pedal edema. Gastrointestinal system: Abdomen soft, nontender non-distended, BS +. No hepatosplenomegaly palpable. Nervous System:Alert, awake and oriented at baseline.Able to move UE and LE, sensation intact. Extremities: Mild leg edema on the left leg, chronic hyperpigmented skin changes with tenderness, erythema on the left distal leg, erythema appears better from yesterday's picture.No drainage. Dorsalis pedis palpable on the left leg however due to wound unable to examine the posterior tibialis. Skin: No rashes,no icterus. MSK: Normal muscle bulk,tone,power.  Data Reviewed: I have personally reviewed following labs and imaging studies  CBC: Recent Labs  Lab 12/24/17 0541 12/25/17 0823 12/29/17 1025 12/30/17 0322  WBC 8.8 6.9 9.5 9.5  NEUTROABS  --   --  6.8  --   HGB 8.9* 8.3* 8.6* 8.0*  HCT 27.4* 25.4* 27.3* 24.8*  MCV 93.5 92.7 94.5 91.2  PLT 186 193 380 956   Basic Metabolic Panel: Recent Labs  Lab 12/24/17 0541 12/25/17 0823 12/29/17 1025 12/30/17 0322  NA 138 138 136 135  K 3.2* 3.2* 3.9 3.7  CL 108 110 103 100  CO2 20* 22 25 24   GLUCOSE 108* 119* 117* 100*  BUN 21 14 13 14   CREATININE 1.33* 1.11* 1.26* 1.30*  CALCIUM 9.2 9.1 9.7 9.5   GFR: Estimated Creatinine Clearance: 34.1 mL/min (A) (by C-G formula based on SCr of 1.3 mg/dL (H)). Liver Function Tests: Recent Labs  Lab 12/29/17 1025  AST 38  ALT 33  ALKPHOS 76  BILITOT 1.2  PROT 6.0*  ALBUMIN 2.2*   No results for input(s): LIPASE, AMYLASE in the last 168 hours. No results for input(s): AMMONIA in the last 168 hours. Coagulation Profile: No results for input(s): INR, PROTIME in the last 168 hours. Cardiac Enzymes: No results for input(s): CKTOTAL, CKMB, CKMBINDEX, TROPONINI in the last 168 hours. BNP (last 3 results) No results for input(s): PROBNP in the last 8760 hours. HbA1C: No results for input(s): HGBA1C in the last 72 hours. CBG: No results for input(s): GLUCAP in the  last 168 hours. Lipid Profile: No results for input(s): CHOL, HDL, LDLCALC, TRIG, CHOLHDL, LDLDIRECT in the last 72 hours. Thyroid Function Tests: No results for input(s): TSH, T4TOTAL, FREET4, T3FREE, THYROIDAB in the last 72 hours. Anemia Panel: No results for input(s): VITAMINB12, FOLATE, FERRITIN, TIBC, IRON, RETICCTPCT in the last 72 hours. Sepsis Labs: Recent Labs  Lab 12/29/17 1041  LATICACIDVEN 1.21    Recent Results (from the past 240 hour(s))  Culture, blood (Routine X 2) w Reflex to ID Panel     Status: None   Collection Time: 12/21/17  3:27 PM  Result Value Ref Range Status   Specimen Description BLOOD BLOOD LEFT ARM  Final   Special Requests   Final    BOTTLES DRAWN AEROBIC AND ANAEROBIC Blood Culture adequate volume   Culture   Final    NO GROWTH 5 DAYS Performed at [facility], [address] [place], [place] [address]    Report Status 12/26/2017 FINAL  Final  Culture, blood (Routine X 2) w Reflex to ID Panel     Status: None   Collection Time: 12/21/17  3:29 PM  Result Value Ref Range Status   Specimen Description BLOOD LEFT ANTECUBITAL  Final  Special Requests   Final    BOTTLES DRAWN AEROBIC AND ANAEROBIC Blood Culture adequate volume   Culture   Final    NO GROWTH 5 DAYS Performed at [facility], [place] [address], [place], [place] [address]    Report Status 12/26/2017 FINAL  Final  Blood Culture (routine x 2)     Status: None (Preliminary result)   Collection Time: 12/29/17 10:30 AM  Result Value Ref Range Status   Specimen Description BLOOD LEFT ANTECUBITAL  Final   Special Requests   Final    BOTTLES DRAWN AEROBIC AND ANAEROBIC Blood Culture adequate volume   Culture   Final    NO GROWTH < 24 HOURS Performed at [facility], [place] [address] [place], [place] [address]    Report Status PENDING  Incomplete  Blood Culture (routine x 2)     Status: None (Preliminary result)   Collection Time: 12/29/17 10:30 AM  Result Value Ref  Range Status   Specimen Description BLOOD RIGHT ANTECUBITAL  Final   Special Requests   Final    BOTTLES DRAWN AEROBIC AND ANAEROBIC Blood Culture adequate volume   Culture   Final    NO GROWTH < 24 HOURS Performed at [facility], [place] [address] [place], [place] [address]    Report Status PENDING  Incomplete      Radiology Studies: Ct Tibia Fibula Left W Contrast  Result Date: 12/29/2017 CLINICAL DATA:  Large wound on the distal aspect of the left lower leg and ankle. Subsequent encounter. EXAM: CT OF THE LOWER RIGHT EXTREMITY WITH CONTRAST TECHNIQUE: Multidetector CT imaging of the lower right extremity was performed according to the standard protocol following intravenous contrast administration. COMPARISON:  CT left lower leg 12/25/2017. CONTRAST:  100 mL OMNIPAQUE IOHEXOL 300 MG/ML  SOLN FINDINGS: Bones/Joint/Cartilage No acute abnormality is identified. No bony destructive change or periosteal reaction. Osteophytosis about the knee consistent with degenerative disease is again seen. Ligaments Suboptimally assessed by CT. Muscles and Tendons Fat planes in muscle are preserved. No intramuscular fluid collection or gas tracking along fascial planes is identified. Scattered fatty deposits in muscles could be due to atrophy or less likely lipomas. Soft tissues Subcutaneous edema is present diffusely and appears worst distally. No focal fluid collection is identified. Scattered subcutaneous calcifications are most consistent with prior infectious or inflammatory process. IMPRESSION: No marked change in the appearance of the left lower leg. Diffuse subcutaneous edema is worst distally and consistent with dependent change or cellulitis. Negative for abscess, osteomyelitis or necrotizing fasciitis. Electronically Signed   By: Inge Rise M.D.   On: 12/29/2017 13:58   Dg Chest Portable 1 View  Result Date: 12/29/2017 CLINICAL DATA:  Left lower extremity pain and swelling.  Fever. EXAM:  PORTABLE CHEST 1 VIEW COMPARISON:  12/21/2017 FINDINGS: Stable cardiac enlargement and stable tortuosity and calcification of the thoracic aorta. Persistent central vascular congestion without overt pulmonary edema. No infiltrates or effusions. The bony thorax is intact. Remote surgical changes involving the right shoulder. IMPRESSION: Stable cardiac enlargement with central vascular congestion but no infiltrates, edema or effusions. Electronically Signed   By: Marijo Sanes M.D.   On: 12/29/2017 10:56   Dg Tibia/fibula Left Port  Result Date: 12/29/2017 CLINICAL DATA:  Left lower extremity pain and swelling. EXAM: PORTABLE LEFT TIBIA AND FIBULA - 2 VIEW COMPARISON:  CT scan 12/25/2017 FINDINGS: Moderate degenerative changes involving the knee and ankle but no acute bony findings or destructive bony  lesions. Small lucency noted in the mid tibia medially has the appearance of a nutrient vessel on the recent prior CT scan. Significant subcutaneous soft tissue swelling/edema/fluid. There are also extensive calcifications noted in the subcutaneous fat possibly reflecting chronic venous stasis, chronic cellulitis or process such as dermatomyositis. IMPRESSION: No acute bony findings. Diffuse subcutaneous soft tissue swelling/edema/fluid. Electronically Signed   By: Marijo Sanes M.D.   On: 12/29/2017 11:01     Scheduled Meds: . apixaban  5 mg Oral Q12H  . buPROPion  450 mg Oral Daily  . carvedilol  12.5 mg Oral BID WC  . docusate sodium  100 mg Oral BID  . donepezil  10 mg Oral QHS  . furosemide  20 mg Oral Daily  . gabapentin  300 mg Oral BID  . iron polysaccharides  150 mg Oral Daily  . losartan  50 mg Oral Daily  . pantoprazole  40 mg Oral Daily  . pravastatin  40 mg Oral QPM  . sertraline  12.5 mg Oral BID   Continuous Infusions: . lactated ringers 75 mL/hr at 12/29/17 1631  . piperacillin-tazobactam (ZOSYN)  IV    . vancomycin       LOS: 0 days   Time spent: More than 50% of that time  was spent in counseling and/or coordination of care.  Antonieta Pert, MD [facility] Pager [phone]  If 7PM-7AM, please contact night-coverage [email or web address] Password [email or web address] 12/30/2017, 1:23 PM

## 2017-12-30 NOTE — Plan of Care (Signed)
?  Problem: Clinical Measurements: ?Goal: Ability to avoid or minimize complications of infection will improve ?Outcome: Progressing ?  ?Problem: Skin Integrity: ?Goal: Skin integrity will improve ?Outcome: Progressing ?  ?

## 2017-12-31 DIAGNOSIS — L03116 Cellulitis of left lower limb: Principal | ICD-10-CM

## 2017-12-31 DIAGNOSIS — I4819 Other persistent atrial fibrillation: Secondary | ICD-10-CM

## 2017-12-31 DIAGNOSIS — I5032 Chronic diastolic (congestive) heart failure: Secondary | ICD-10-CM

## 2017-12-31 DIAGNOSIS — I110 Hypertensive heart disease with heart failure: Secondary | ICD-10-CM

## 2017-12-31 DIAGNOSIS — N183 Chronic kidney disease, stage 3 (moderate): Secondary | ICD-10-CM

## 2017-12-31 LAB — BASIC METABOLIC PANEL
Anion gap: 12 (ref 5–15)
BUN: 14 mg/dL (ref 8–23)
CO2: 26 mmol/L (ref 22–32)
Calcium: 9.6 mg/dL (ref 8.9–10.3)
Chloride: 102 mmol/L (ref 98–111)
Creatinine, Ser: 1.21 mg/dL — ABNORMAL HIGH (ref 0.44–1.00)
GFR calc Af Amer: 49 mL/min — ABNORMAL LOW (ref >60)
GFR calc non Af Amer: 43 mL/min — ABNORMAL LOW (ref >60)
Glucose, Bld: 89 mg/dL (ref 70–99)
Potassium: 4 mmol/L (ref 3.5–5.1)
SODIUM: 140 mmol/L (ref 135–145)

## 2017-12-31 LAB — CBC
HCT: 23.9 % — ABNORMAL LOW (ref 36.0–46.0)
Hemoglobin: 7.6 g/dL — ABNORMAL LOW (ref 12.0–15.0)
MCH: 29.3 pg (ref 26.0–34.0)
MCHC: 31.8 g/dL (ref 30.0–36.0)
MCV: 92.3 fL (ref 80.0–100.0)
NRBC: 0 % (ref 0.0–0.2)
Platelets: 412 10*3/uL — ABNORMAL HIGH (ref 150–400)
RBC: 2.59 MIL/uL — ABNORMAL LOW (ref 3.87–5.11)
RDW: 14 % (ref 11.5–15.5)
WBC: 9.7 10*3/uL (ref 4.0–10.5)

## 2017-12-31 NOTE — Progress Notes (Signed)
Patient attempted to get out of bed. Was able to move herself to the edge of bed and put legs on the floor independently. Unable to stand at this time due to the pain. Pain medication given and will attempt again later. Massie Bougie, RN

## 2017-12-31 NOTE — Progress Notes (Signed)
PROGRESS NOTE    Susan [name]   TOI:[id number]  DOB: 08-26-1940  DOA: 12/29/2017 PCP: Lauree Chandler, NP   Brief Narrative:  Susan [name] with history of CVA, vascular dementia, A. fib, OSA on CPAP, depression, primary hyperparathyroidism, hyperlipidemia, chronic back pain, chronic diastolic CHF comes to the ER with complaint of worsening left leg redness, pain.  Per family someone placed a bandage on her wound on Left leg at SNF and it burst and started oozing-  they were not able to see doctor on the weekend. Temp was 102.   Patient was recently admitted from 12/15 to 12/19 for sepsis due to left lower extremity cellulitis along with acute metabolic encephalopathy, was discharged on Augmentin for 7 days.  In the ER, found to have cellulitis, with purulent fluid draining from the left medial leg,  fever 101.7.   - given vancomycin and was admitted. Picture on admission.     Subjective: States leg looks much better. Have asked her to walk and RN has texted me to tell me that she has severe pain when bearing weight.    Assessment & Plan:   Principal Problem:   Cellulitis and abscess of leg - failed Augmentin- now on Vanc and Zosyn- leg is improving- fever is down  Active Problems:   Hypertensive heart disease   Chronic diastolic (congestive) heart failure  - Coreg, lasix    Persistent atrial fibrillation - cont Eliquis and Coreg    CKD (chronic kidney disease) stage 3, GFR 30-59 ml/min (HCC) - stable   Anemia - chronic  DVT prophylaxis: Eliquis Code Status: Full code Family Communication:  Disposition Plan: will need to return to SNF Consultants:   none Procedures:   none Antimicrobials:  Anti-infectives (From admission, onward)   Start     Dose/Rate Route Frequency Ordered Stop   12/30/17 2200  piperacillin-tazobactam (ZOSYN) IVPB 3.375 g     3.375 g 12.5 mL/hr over 240 Minutes Intravenous Every 8 hours 12/29/17 1608     12/30/17 1300  vancomycin  (VANCOCIN) 500 mg in sodium chloride 0.9 % 100 mL IVPB     500 mg 100 mL/hr over 60 Minutes Intravenous Every 24 hours 12/29/17 1128     12/29/17 1545  piperacillin-tazobactam (ZOSYN) IVPB 3.375 g     3.375 g 100 mL/hr over 30 Minutes Intravenous  Once 12/29/17 1539 12/29/17 1703   12/29/17 1030  vancomycin (VANCOCIN) 1,500 mg in sodium chloride 0.9 % 500 mL IVPB     1,500 mg 250 mL/hr over 120 Minutes Intravenous  Once 12/29/17 1016 12/29/17 1413   12/29/17 1015  vancomycin (VANCOCIN) IVPB 1000 mg/200 mL premix  Status:  Discontinued     1,000 mg 200 mL/hr over 60 Minutes Intravenous  Once 12/29/17 1013 12/29/17 1016   12/29/17 1015  cefTRIAXone (ROCEPHIN) 2 g in sodium chloride 0.9 % 100 mL IVPB  Status:  Discontinued     2 g 200 mL/hr over 30 Minutes Intravenous Every 24 hours 12/29/17 1013 12/29/17 1539       Objective: Vitals:   12/30/17 2032 12/30/17 2040 12/31/17 0609 12/31/17 1429  BP: (!) 149/102 140/82 (!) 157/84 (!) 150/65  Pulse: 87  69 74  Resp:   17 18  Temp: (!) 100.6 F (38.1 C)  99.1 F (37.3 C) 99.2 F (37.3 C)  TempSrc: Oral  Oral Oral  SpO2: 97%  95% 92%  Weight:      Height:  Intake/Output Summary (Last 24 hours) at 12/31/2017 1611 Last data filed at 12/31/2017 0827 Gross per 24 hour  Intake 840 ml  Output 2000 ml  Net -1160 ml   Filed Weights   12/29/17 1011  Weight: 85.6 kg    Examination: General exam: Appears comfortable  HEENT: PERRLA, oral mucosa moist, no sclera icterus or thrush Respiratory system: Clear to auscultation. Respiratory effort normal. Cardiovascular system: S1 & S2 heard, RRR.   Gastrointestinal system: Abdomen soft, non-tender, nondistended. Normal bowel sounds. Central nervous system: Alert and oriented. No focal neurological deficits. Extremities: No cyanosis, clubbing or edema Skin: see below Psychiatry:  Mood & affect appropriate.       Data Reviewed: I have personally reviewed following labs and  imaging studies  CBC: Recent Labs  Lab 12/25/17 0823 12/29/17 1025 12/30/17 0322 12/31/17 0348  WBC 6.9 9.5 9.5 9.7  NEUTROABS  --  6.8  --   --   HGB 8.3* 8.6* 8.0* 7.6*  HCT 25.4* 27.3* 24.8* 23.9*  MCV 92.7 94.5 91.2 92.3  PLT 193 380 369 629*   Basic Metabolic Panel: Recent Labs  Lab 12/25/17 0823 12/29/17 1025 12/30/17 0322 12/31/17 0348  NA 138 136 135 140  K 3.2* 3.9 3.7 4.0  CL 110 103 100 102  CO2 22 25 24 26   GLUCOSE 119* 117* 100* 89  BUN 14 13 14 14   CREATININE 1.11* 1.26* 1.30* 1.21*  CALCIUM 9.1 9.7 9.5 9.6   GFR: Estimated Creatinine Clearance: 36.6 mL/min (A) (by C-G formula based on SCr of 1.21 mg/dL (H)). Liver Function Tests: Recent Labs  Lab 12/29/17 1025  AST 38  ALT 33  ALKPHOS 76  BILITOT 1.2  PROT 6.0*  ALBUMIN 2.2*   No results for input(s): LIPASE, AMYLASE in the last 168 hours. No results for input(s): AMMONIA in the last 168 hours. Coagulation Profile: No results for input(s): INR, PROTIME in the last 168 hours. Cardiac Enzymes: No results for input(s): CKTOTAL, CKMB, CKMBINDEX, TROPONINI in the last 168 hours. BNP (last 3 results) No results for input(s): PROBNP in the last 8760 hours. HbA1C: No results for input(s): HGBA1C in the last 72 hours. CBG: No results for input(s): GLUCAP in the last 168 hours. Lipid Profile: No results for input(s): CHOL, HDL, LDLCALC, TRIG, CHOLHDL, LDLDIRECT in the last 72 hours. Thyroid Function Tests: No results for input(s): TSH, T4TOTAL, FREET4, T3FREE, THYROIDAB in the last 72 hours. Anemia Panel: No results for input(s): VITAMINB12, FOLATE, FERRITIN, TIBC, IRON, RETICCTPCT in the last 72 hours. Urine analysis:    Component Value Date/Time   COLORURINE YELLOW 12/29/2017 [place] 12/29/2017 1348   LABSPEC <1.005 (L) 12/29/2017 1348   PHURINE 5.5 12/29/2017 1348   GLUCOSEU NEGATIVE 12/29/2017 1348   HGBUR NEGATIVE 12/29/2017 1348   BILIRUBINUR NEGATIVE 12/29/2017 1348    KETONESUR NEGATIVE 12/29/2017 1348   PROTEINUR NEGATIVE 12/29/2017 1348   UROBILINOGEN 0.2 03/05/2013 1259   NITRITE NEGATIVE 12/29/2017 1348   LEUKOCYTESUR NEGATIVE 12/29/2017 1348   Sepsis Labs: @LABRCNTIP (procalcitonin:4,lacticidven:4) ) Recent Results (from the past 240 hour(s))  Blood Culture (routine x 2)     Status: None (Preliminary result)   Collection Time: 12/29/17 10:30 AM  Result Value Ref Range Status   Specimen Description BLOOD LEFT ANTECUBITAL  Final   Special Requests   Final    BOTTLES DRAWN AEROBIC AND ANAEROBIC Blood Culture adequate volume Performed at [facility], [place] [address], [place], [place] [address]  Culture NO GROWTH 2 DAYS  Final   Report Status PENDING  Incomplete  Blood Culture (routine x 2)     Status: None (Preliminary result)   Collection Time: 12/29/17 10:30 AM  Result Value Ref Range Status   Specimen Description BLOOD RIGHT ANTECUBITAL  Final   Special Requests   Final    BOTTLES DRAWN AEROBIC AND ANAEROBIC Blood Culture adequate volume Performed at [facility], [place] [address], [place], [place] [address]    Culture NO GROWTH 2 DAYS  Final   Report Status PENDING  Incomplete  Urine culture     Status: None   Collection Time: 12/29/17  1:48 PM  Result Value Ref Range Status   Specimen Description URINE, RANDOM  Final   Special Requests NONE  Final   Culture   Final    NO GROWTH Performed at [facility], [place] [address], [place], [place] [address]    Report Status 12/30/2017 FINAL  Final         Radiology Studies: No results found.    Scheduled Meds: . apixaban  5 mg Oral Q12H  . buPROPion  450 mg Oral Daily  . carvedilol  12.5 mg Oral BID WC  . docusate sodium  100 mg Oral BID  . donepezil  10 mg Oral QHS  . furosemide  20 mg Oral Daily  . gabapentin  300 mg Oral BID  . iron polysaccharides  150 mg Oral Daily  . losartan  50 mg Oral Daily  . pantoprazole  40 mg Oral Daily  . pravastatin  40 mg  Oral QPM  . sertraline  12.5 mg Oral BID   Continuous Infusions: . lactated ringers 75 mL/hr at 12/29/17 1631  . piperacillin-tazobactam (ZOSYN)  IV 3.375 g (12/31/17 1452)  . vancomycin 500 mg (12/31/17 1322)     LOS: 1 day    Time spent in minutes: 35     Debbe Odea, MD [facility] Pager: [email or web address] Password [email or web address] 12/31/2017, 4:11 PM

## 2017-12-31 NOTE — Progress Notes (Signed)
RT offered pt CPAP for the night pt declined for second night. RT will continue to monitor.

## 2018-01-01 LAB — CBC
HCT: 24.5 % — ABNORMAL LOW (ref 36.0–46.0)
Hemoglobin: 8 g/dL — ABNORMAL LOW (ref 12.0–15.0)
MCH: 29.9 pg (ref 26.0–34.0)
MCHC: 32.7 g/dL (ref 30.0–36.0)
MCV: 91.4 fL (ref 80.0–100.0)
Platelets: 418 10*3/uL — ABNORMAL HIGH (ref 150–400)
RBC: 2.68 MIL/uL — ABNORMAL LOW (ref 3.87–5.11)
RDW: 13.9 % (ref 11.5–15.5)
WBC: 9.3 10*3/uL (ref 4.0–10.5)
nRBC: 0 % (ref 0.0–0.2)

## 2018-01-01 MED ORDER — DOXYCYCLINE HYCLATE 100 MG PO TABS
100.0000 mg | ORAL_TABLET | Freq: Two times a day (BID) | ORAL | Status: DC
  Administered 2018-01-01 – 2018-01-03 (×5): 100 mg via ORAL
  Filled 2018-01-01 (×5): qty 1

## 2018-01-01 MED ORDER — CEPHALEXIN 500 MG PO CAPS: 500.0000 mg | ORAL_CAPSULE | Freq: Four times a day (QID) | ORAL | Status: DC

## 2018-01-01 MED ORDER — DOXYCYCLINE HYCLATE 100 MG PO TABS: 100.0000 mg | ORAL_TABLET | Freq: Two times a day (BID) | ORAL | Status: DC

## 2018-01-01 MED ORDER — AMOXICILLIN-POT CLAVULANATE 875-125 MG PO TABS
1.0000 | ORAL_TABLET | Freq: Two times a day (BID) | ORAL | Status: DC
  Administered 2018-01-01 (×2): 1 via ORAL
  Filled 2018-01-01 (×2): qty 1

## 2018-01-01 NOTE — Progress Notes (Signed)
RT offered pt CPAP for the night and pt declined for the third night. RT will continue to monitor.

## 2018-01-01 NOTE — Evaluation (Signed)
Physical Therapy Evaluation Patient Details Name: Susan [name] MRN: [id number] DOB: 25-Nov-1940 Today's Date: 01/01/2018   History of Present Illness  Pt with history of CVA, vascular dementia, A. fib, OSA on CPAP, depression, primary hyperparathyroidism, hyperlipidemia, chronic back pain, chronic diastolic CHF presented to the ER with complaint of worsening left leg redness, pain.  Patient was recently admitted from 12/15to 12/27for sepsis due to left lower extremity cellulitis along with acute metabolic encephalopathy.  In the ER,found to have cellulitis,with purulent fluid draining from the left medial leg  Clinical Impression  Patient presents with dependencies in gait and transfers, due to generalized weakness, decreased balance and pain.  Patient will benefit from PT to progress mobility and increase independence.  Agree patient will need SNF at discharge for continued therapy.      Follow Up Recommendations SNF    Equipment Recommendations  None recommended by PT    Recommendations for Other Services       Precautions / Restrictions Precautions Precautions: Fall Restrictions Weight Bearing Restrictions: No      Mobility  Bed Mobility Overal bed mobility: Needs Assistance Bed Mobility: Supine to Sit     Supine to sit: Supervision     General bed mobility comments: Pt required increased time  and effort to get to EOB due to L LE pain; used railing for mobility; verbal cues for sequencing  Transfers Overall transfer level: Needs assistance Equipment used: Rolling walker (2 wheeled) Transfers: Sit to/from [facility] Sit to Stand: Max assist Stand pivot transfers: Max assist       General transfer comment: only able to reach standing and then returned to sitting immediately due to pain LLE  Ambulation/Gait Ambulation/Gait assistance: (unable)              Stairs            Wheelchair Mobility    Modified Rankin (Stroke  Patients Only)       Balance Overall balance assessment: Needs assistance Sitting-balance support: No upper extremity supported Sitting balance-Leahy Scale: Good                                       Pertinent Vitals/Pain Pain Assessment: Faces Faces Pain Scale: Hurts whole lot Pain Location: left LE Pain Descriptors / Indicators: Shooting;Throbbing;Burning;Sore;Tightness Pain Intervention(s): Limited activity within patient's tolerance;Monitored during session;Premedicated before session    [facility] expects to be discharged to:: Skilled nursing facility                 Additional Comments: Pt lives alone; plans to d/c to [facility]    Prior Function Level of Independence: Needs assistance   Gait / Transfers Assistance Needed: uses RW  ADL's / Homemaking Assistance Needed: Pt states she is independent with ADLs, bathing, cooking        Hand Dominance   Dominant Hand: Right    Extremity/Trunk Assessment   Upper Extremity Assessment Upper Extremity Assessment: Overall WFL for tasks assessed    Lower Extremity Assessment RLE Deficits / Details: limited AROM. LLE Deficits / Details: increased edema LLE: Unable to fully assess due to pain       Communication   Communication: No difficulties  Cognition Arousal/Alertness: Awake/alert Behavior During Therapy: WFL for tasks assessed/performed Overall Cognitive Status: No family/caregiver present to determine baseline cognitive functioning  General Comments      Exercises     Assessment/Plan    PT Assessment Patient needs continued PT services  PT Problem List Decreased strength;Decreased mobility;Decreased activity tolerance;Decreased balance;Pain       PT Treatment Interventions Gait training;Therapeutic exercise;Therapeutic activities;Patient/family education;Balance training;Functional mobility training;DME  instruction    PT Goals (Current goals can be found in the Care Plan section)  Acute Rehab PT Goals Patient Stated Goal: decrease pain in L leg PT Goal Formulation: With patient Time For Goal Achievement: 01/15/18 Potential to Achieve Goals: Fair    Frequency Min 2X/week   Barriers to discharge        Co-evaluation               AM-PAC PT "6 Clicks" Mobility  Outcome Measure Help needed turning from your back to your side while in a flat bed without using bedrails?: A Lot Help needed moving from lying on your back to sitting on the side of a flat bed without using bedrails?: A Lot Help needed moving to and from a bed to a chair (including a wheelchair)?: A Lot Help needed standing up from a chair using your arms (e.g., wheelchair or bedside chair)?: A Lot Help needed to walk in hospital room?: Total Help needed climbing 3-5 steps with a railing? : Total 6 Click Score: 10    End of Session Equipment Utilized During Treatment: Gait belt Activity Tolerance: Patient limited by pain;Patient limited by fatigue Patient left: in chair;with call bell/phone within reach;with chair alarm set   PT Visit Diagnosis: Other abnormalities of gait and mobility (R26.89);Muscle weakness (generalized) (M62.81);Pain Pain - Right/Left: Left Pain - part of body: Ankle and joints of foot    Time: 1050-1120 PT Time Calculation (min) (ACUTE ONLY): 30 min   Charges:   PT Evaluation $PT Eval Moderate Complexity: 1 Mod PT Treatments $Therapeutic Activity: 8-22 mins        01/01/2018 [place], PT Acute Rehabilitation Services Pager:  [phone] Office:  [phone]    [name] 01/01/2018, 11:49 AM

## 2018-01-01 NOTE — Progress Notes (Signed)
PROGRESS NOTE    Susan [name]   IRJ:[id number]  DOB: 08-24-1940  DOA: 12/29/2017 PCP: Lauree Chandler, NP   Brief Narrative:  Susan [name] with history of CVA, vascular dementia, A. fib, OSA on CPAP, depression, primary hyperparathyroidism, hyperlipidemia, chronic back pain, chronic diastolic CHF comes to the ER with complaint of worsening left leg redness, pain.  Per family someone placed a bandage on her wound on Left leg at SNF and it burst and started oozing-  they were not able to see doctor on the weekend. Temp was 102.   Patient was recently admitted from 12/15 to 12/19 for sepsis due to left lower extremity cellulitis along with acute metabolic encephalopathy, was discharged on Augmentin for 7 days.  In the ER, found to have cellulitis, with purulent fluid draining from the left medial leg,  fever 101.7.   - given vancomycin and was admitted. Picture on admission.     Subjective: She has no new complaints. Still having pain when leg is touched.    Assessment & Plan:   Principal Problem:   Cellulitis and abscess of leg - failed Augmentin alone- now on Vanc and Zosyn- leg is improving- fever is down- switch to Doxy and Augmentin  Active Problems:   Hypertensive heart disease   Chronic diastolic (congestive) heart failure  - Coreg, lasix    Persistent atrial fibrillation - cont Eliquis and Coreg    CKD (chronic kidney disease) stage 3, GFR 30-59 ml/min (HCC) - stable   Anemia - chronic  DVT prophylaxis: Eliquis Code Status: Full code Family Communication:  Disposition Plan: will need to return to SNF- awaiting SNF placement now Consultants:   none Procedures:   none Antimicrobials:  Anti-infectives (From admission, onward)   Start     Dose/Rate Route Frequency Ordered Stop   01/01/18 1200  cephALEXin (KEFLEX) capsule 500 mg  Status:  Discontinued     500 mg Oral Every 6 hours 01/01/18 0848 01/01/18 0852   01/01/18 1000  doxycycline (VIBRA-TABS)  tablet 100 mg     100 mg Oral Every 12 hours 01/01/18 0848     01/01/18 1000  amoxicillin-clavulanate (AUGMENTIN) 875-125 MG per tablet 1 tablet     1 tablet Oral Every 12 hours 01/01/18 0852     01/01/18 1000  doxycycline (VIBRA-TABS) tablet 100 mg  Status:  Discontinued     100 mg Oral Every 12 hours 01/01/18 0852 01/01/18 0853   12/30/17 2200  piperacillin-tazobactam (ZOSYN) IVPB 3.375 g  Status:  Discontinued     3.375 g 12.5 mL/hr over 240 Minutes Intravenous Every 8 hours 12/29/17 1608 01/01/18 0851   12/30/17 1300  vancomycin (VANCOCIN) 500 mg in sodium chloride 0.9 % 100 mL IVPB  Status:  Discontinued     500 mg 100 mL/hr over 60 Minutes Intravenous Every 24 hours 12/29/17 1128 01/01/18 0852   12/29/17 1545  piperacillin-tazobactam (ZOSYN) IVPB 3.375 g     3.375 g 100 mL/hr over 30 Minutes Intravenous  Once 12/29/17 1539 12/29/17 1703   12/29/17 1030  vancomycin (VANCOCIN) 1,500 mg in sodium chloride 0.9 % 500 mL IVPB     1,500 mg 250 mL/hr over 120 Minutes Intravenous  Once 12/29/17 1016 12/29/17 1413   12/29/17 1015  vancomycin (VANCOCIN) IVPB 1000 mg/200 mL premix  Status:  Discontinued     1,000 mg 200 mL/hr over 60 Minutes Intravenous  Once 12/29/17 1013 12/29/17 1016   12/29/17 1015  cefTRIAXone (ROCEPHIN) 2 g in  sodium chloride 0.9 % 100 mL IVPB  Status:  Discontinued     2 g 200 mL/hr over 30 Minutes Intravenous Every 24 hours 12/29/17 1013 12/29/17 1539       Objective: Vitals:   12/31/17 1429 12/31/17 2104 01/01/18 0604 01/01/18 1417  BP: (!) 150/65 (!) 175/77 (!) 151/67 (!) 151/71  Pulse: 74 72 73 65  Resp: 18   17  Temp: 99.2 F (37.3 C) 97.9 F (36.6 C) 99.4 F (37.4 C) 98.4 F (36.9 C)  TempSrc: Oral Oral Oral Oral  SpO2: 92% 100% 92% 100%  Weight:      Height:        Intake/Output Summary (Last 24 hours) at 01/01/2018 1832 Last data filed at 01/01/2018 1206 Gross per 24 hour  Intake 1015.19 ml  Output 1200 ml  Net -184.81 ml   Filed Weights     12/29/17 1011  Weight: 85.6 kg    Examination: General exam: Appears comfortable  HEENT: PERRLA, oral mucosa moist, no sclera icterus or thrush Respiratory system: Clear to auscultation. Respiratory effort normal. Cardiovascular system: S1 & S2 heard,  No murmurs  Gastrointestinal system: Abdomen soft, non-tender, nondistended. Normal bowel sound. No organomegaly Central nervous system: Alert and oriented. No focal neurological deficits. Extremities: No cyanosis, clubbing or edema Skin:ulcer on left leg noted, mild erythema still present Psychiatry:  Mood & affect appropriate.    Data Reviewed: I have personally reviewed following labs and imaging studies  CBC: Recent Labs  Lab 12/29/17 1025 12/30/17 0322 12/31/17 0348 01/01/18 0900  WBC 9.5 9.5 9.7 9.3  NEUTROABS 6.8  --   --   --   HGB 8.6* 8.0* 7.6* 8.0*  HCT 27.3* 24.8* 23.9* 24.5*  MCV 94.5 91.2 92.3 91.4  PLT 380 369 412* 696*   Basic Metabolic Panel: Recent Labs  Lab 12/29/17 1025 12/30/17 0322 12/31/17 0348  NA 136 135 140  K 3.9 3.7 4.0  CL 103 100 102  CO2 25 24 26   GLUCOSE 117* 100* 89  BUN 13 14 14   CREATININE 1.26* 1.30* 1.21*  CALCIUM 9.7 9.5 9.6   GFR: Estimated Creatinine Clearance: 36.6 mL/min (A) (by C-G formula based on SCr of 1.21 mg/dL (H)). Liver Function Tests: Recent Labs  Lab 12/29/17 1025  AST 38  ALT 33  ALKPHOS 76  BILITOT 1.2  PROT 6.0*  ALBUMIN 2.2*   No results for input(s): LIPASE, AMYLASE in the last 168 hours. No results for input(s): AMMONIA in the last 168 hours. Coagulation Profile: No results for input(s): INR, PROTIME in the last 168 hours. Cardiac Enzymes: No results for input(s): CKTOTAL, CKMB, CKMBINDEX, TROPONINI in the last 168 hours. BNP (last 3 results) No results for input(s): PROBNP in the last 8760 hours. HbA1C: No results for input(s): HGBA1C in the last 72 hours. CBG: No results for input(s): GLUCAP in the last 168 hours. Lipid Profile: No  results for input(s): CHOL, HDL, LDLCALC, TRIG, CHOLHDL, LDLDIRECT in the last 72 hours. Thyroid Function Tests: No results for input(s): TSH, T4TOTAL, FREET4, T3FREE, THYROIDAB in the last 72 hours. Anemia Panel: No results for input(s): VITAMINB12, FOLATE, FERRITIN, TIBC, IRON, RETICCTPCT in the last 72 hours. Urine analysis:    Component Value Date/Time   COLORURINE YELLOW 12/29/2017 [place] 12/29/2017 1348   LABSPEC <1.005 (L) 12/29/2017 1348   PHURINE 5.5 12/29/2017 1348   GLUCOSEU NEGATIVE 12/29/2017 1348   HGBUR NEGATIVE 12/29/2017 [place] 12/29/2017 1348  KETONESUR NEGATIVE 12/29/2017 [place] 12/29/2017 1348   UROBILINOGEN 0.2 03/05/2013 1259   NITRITE NEGATIVE 12/29/2017 1348   LEUKOCYTESUR NEGATIVE 12/29/2017 1348   Sepsis Labs: @LABRCNTIP (procalcitonin:4,lacticidven:4) ) Recent Results (from the past 240 hour(s))  Blood Culture (routine x 2)     Status: None (Preliminary result)   Collection Time: 12/29/17 10:30 AM  Result Value Ref Range Status   Specimen Description BLOOD LEFT ANTECUBITAL  Final   Special Requests   Final    BOTTLES DRAWN AEROBIC AND ANAEROBIC Blood Culture adequate volume   Culture   Final    NO GROWTH 3 DAYS Performed at [facility], [place] [address] [place], [place] [address]    Report Status PENDING  Incomplete  Blood Culture (routine x 2)     Status: None (Preliminary result)   Collection Time: 12/29/17 10:30 AM  Result Value Ref Range Status   Specimen Description BLOOD RIGHT ANTECUBITAL  Final   Special Requests   Final    BOTTLES DRAWN AEROBIC AND ANAEROBIC Blood Culture adequate volume   Culture   Final    NO GROWTH 3 DAYS Performed at [facility], [place] [address] [place], [place] [address]    Report Status PENDING  Incomplete  Urine culture     Status: None   Collection Time: 12/29/17  1:48 PM  Result Value Ref Range Status   Specimen Description URINE,  RANDOM  Final   Special Requests NONE  Final   Culture   Final    NO GROWTH Performed at [facility], [address], [place], [place] [address]    Report Status 12/30/2017 FINAL  Final         Radiology Studies: No results found.    Scheduled Meds: . amoxicillin-clavulanate  1 tablet Oral Q12H  . apixaban  5 mg Oral Q12H  . buPROPion  450 mg Oral Daily  . carvedilol  12.5 mg Oral BID WC  . docusate sodium  100 mg Oral BID  . donepezil  10 mg Oral QHS  . doxycycline  100 mg Oral Q12H  . furosemide  20 mg Oral Daily  . gabapentin  300 mg Oral BID  . iron polysaccharides  150 mg Oral Daily  . losartan  50 mg Oral Daily  . pantoprazole  40 mg Oral Daily  . pravastatin  40 mg Oral QPM  . sertraline  12.5 mg Oral BID   Continuous Infusions: . lactated ringers Stopped (01/01/18 1206)     LOS: 2 days    Time spent in minutes: 35     Debbe Odea, MD [facility] Pager: [email or web address] Password [email or web address] 01/01/2018, 6:32 PM

## 2018-01-02 MED ORDER — CIPROFLOXACIN IN D5W 400 MG/200ML IV SOLN
400.0000 mg | Freq: Once | INTRAVENOUS | Status: AC
  Administered 2018-01-02: 400 mg via INTRAVENOUS
  Filled 2018-01-02: qty 200

## 2018-01-02 NOTE — Consult Note (Signed)
   [facility] CM Inpatient Consult   01/02/2018  [name] 09-14-1940 [id number]  Patient screened for high risk score for unplanned readmissions of 20%.  Chart was reviewed for any [facility] Care Management needs. Per chart review, current disposition plan is for SNF.  No [facility] Care Management needs assessed at this time.    Netta Cedars, MSN, [facility] Liaison Nurse Mobile Phone [phone]  Toll free office [phone]

## 2018-01-02 NOTE — Progress Notes (Signed)
RT NOTE:  Pt refuses CPAP. RT available if patient changes her mind.

## 2018-01-02 NOTE — Progress Notes (Signed)
PROGRESS NOTE    Susan [name]   ERD:[id number]  DOB: 1940/06/28  DOA: 12/29/2017 PCP: Lauree Chandler, NP   Brief Narrative:  Susan [name] with history of CVA, vascular dementia, A. fib, OSA on CPAP, depression, primary hyperparathyroidism, hyperlipidemia, chronic back pain, chronic diastolic CHF comes to the ER with complaint of worsening left leg redness, pain.  Per family someone placed a bandage on her wound on Left leg at SNF and it burst and started oozing-  they were not able to see doctor on the weekend. Temp was 102.   Patient was recently admitted from 12/15 to 12/19 for sepsis due to left lower extremity cellulitis along with acute metabolic encephalopathy, was discharged on Augmentin for 7 days.  In the ER, found to have cellulitis, with purulent fluid draining from the left medial leg,  fever 101.7.   - given vancomycin and was admitted. Picture on admission.     Subjective: No complaints.     Assessment & Plan:   Principal Problem:   Cellulitis and abscess of leg - failed Augmentin alone- given Vanc and Zosyn with improvement noted- switched to Doxy and Augmentin yesterday- leg appears more edematous and erythematous today- will switch Augmentin to Cipro and continue to follow  Active Problems:   Hypertensive heart disease   Chronic diastolic (congestive) heart failure  - cont Coreg, lasix    Persistent atrial fibrillation - cont Eliquis and Coreg    CKD (chronic kidney disease) stage 3, GFR 30-59 ml/min (HCC) - stable   Anemia - chronic  DVT prophylaxis: Eliquis Code Status: Full code Family Communication:  Disposition Plan: will need to return to SNF  Consultants:   none Procedures:   none Antimicrobials:  Anti-infectives (From admission, onward)   Start     Dose/Rate Route Frequency Ordered Stop   01/02/18 0900  ciprofloxacin (CIPRO) IVPB 400 mg     400 mg 200 mL/hr over 60 Minutes Intravenous  Once 01/02/18 0826     01/01/18 1200   cephALEXin (KEFLEX) capsule 500 mg  Status:  Discontinued     500 mg Oral Every 6 hours 01/01/18 0848 01/01/18 0852   01/01/18 1000  doxycycline (VIBRA-TABS) tablet 100 mg     100 mg Oral Every 12 hours 01/01/18 0848     01/01/18 1000  amoxicillin-clavulanate (AUGMENTIN) 875-125 MG per tablet 1 tablet  Status:  Discontinued     1 tablet Oral Every 12 hours 01/01/18 0852 01/02/18 0826   01/01/18 1000  doxycycline (VIBRA-TABS) tablet 100 mg  Status:  Discontinued     100 mg Oral Every 12 hours 01/01/18 0852 01/01/18 0853   12/30/17 2200  piperacillin-tazobactam (ZOSYN) IVPB 3.375 g  Status:  Discontinued     3.375 g 12.5 mL/hr over 240 Minutes Intravenous Every 8 hours 12/29/17 1608 01/01/18 0851   12/30/17 1300  vancomycin (VANCOCIN) 500 mg in sodium chloride 0.9 % 100 mL IVPB  Status:  Discontinued     500 mg 100 mL/hr over 60 Minutes Intravenous Every 24 hours 12/29/17 1128 01/01/18 0852   12/29/17 1545  piperacillin-tazobactam (ZOSYN) IVPB 3.375 g     3.375 g 100 mL/hr over 30 Minutes Intravenous  Once 12/29/17 1539 12/29/17 1703   12/29/17 1030  vancomycin (VANCOCIN) 1,500 mg in sodium chloride 0.9 % 500 mL IVPB     1,500 mg 250 mL/hr over 120 Minutes Intravenous  Once 12/29/17 1016 12/29/17 1413   12/29/17 1015  vancomycin (VANCOCIN) IVPB 1000 mg/200  mL premix  Status:  Discontinued     1,000 mg 200 mL/hr over 60 Minutes Intravenous  Once 12/29/17 1013 12/29/17 1016   12/29/17 1015  cefTRIAXone (ROCEPHIN) 2 g in sodium chloride 0.9 % 100 mL IVPB  Status:  Discontinued     2 g 200 mL/hr over 30 Minutes Intravenous Every 24 hours 12/29/17 1013 12/29/17 1539       Objective: Vitals:   01/01/18 1417 01/01/18 2010 01/02/18 0501 01/02/18 0815  BP: (!) 151/71 124/74 (!) 147/87   Pulse: 65 70 66 70  Resp: 17 16 17    Temp: 98.4 F (36.9 C) 98.8 F (37.1 C) 98.1 F (36.7 C)   TempSrc: Oral Oral Oral   SpO2: 100% 91% 98%   Weight:      Height:        Intake/Output Summary (Last  24 hours) at 01/02/2018 1402 Last data filed at 01/02/2018 0501 Gross per 24 hour  Intake -  Output 1000 ml  Net -1000 ml   Filed Weights   12/29/17 1011  Weight: 85.6 kg    Examination: General exam: Appears comfortable  HEENT: PERRLA, oral mucosa moist, no sclera icterus or thrush Respiratory system: Clear to auscultation. Respiratory effort normal. Cardiovascular system: S1 & S2 heard,  No murmurs  Gastrointestinal system: Abdomen soft, non-tender, nondistended. Normal bowel sound. No organomegaly Central nervous system: Alert and oriented. No focal neurological deficits. Extremities: No cyanosis, clubbing or edema Skin:ulcer on left leg noted, increasing edema and erythema as compared to yesterday Psychiatry:  Mood & affect appropriate.    Data Reviewed: I have personally reviewed following labs and imaging studies  CBC: Recent Labs  Lab 12/29/17 1025 12/30/17 0322 12/31/17 0348 01/01/18 0900  WBC 9.5 9.5 9.7 9.3  NEUTROABS 6.8  --   --   --   HGB 8.6* 8.0* 7.6* 8.0*  HCT 27.3* 24.8* 23.9* 24.5*  MCV 94.5 91.2 92.3 91.4  PLT 380 369 412* 841*   Basic Metabolic Panel: Recent Labs  Lab 12/29/17 1025 12/30/17 0322 12/31/17 0348  NA 136 135 140  K 3.9 3.7 4.0  CL 103 100 102  CO2 25 24 26   GLUCOSE 117* 100* 89  BUN 13 14 14   CREATININE 1.26* 1.30* 1.21*  CALCIUM 9.7 9.5 9.6   GFR: Estimated Creatinine Clearance: 36.6 mL/min (A) (by C-G formula based on SCr of 1.21 mg/dL (H)). Liver Function Tests: Recent Labs  Lab 12/29/17 1025  AST 38  ALT 33  ALKPHOS 76  BILITOT 1.2  PROT 6.0*  ALBUMIN 2.2*   No results for input(s): LIPASE, AMYLASE in the last 168 hours. No results for input(s): AMMONIA in the last 168 hours. Coagulation Profile: No results for input(s): INR, PROTIME in the last 168 hours. Cardiac Enzymes: No results for input(s): CKTOTAL, CKMB, CKMBINDEX, TROPONINI in the last 168 hours. BNP (last 3 results) No results for input(s):  PROBNP in the last 8760 hours. HbA1C: No results for input(s): HGBA1C in the last 72 hours. CBG: No results for input(s): GLUCAP in the last 168 hours. Lipid Profile: No results for input(s): CHOL, HDL, LDLCALC, TRIG, CHOLHDL, LDLDIRECT in the last 72 hours. Thyroid Function Tests: No results for input(s): TSH, T4TOTAL, FREET4, T3FREE, THYROIDAB in the last 72 hours. Anemia Panel: No results for input(s): VITAMINB12, FOLATE, FERRITIN, TIBC, IRON, RETICCTPCT in the last 72 hours. Urine analysis:    Component Value Date/Time   COLORURINE YELLOW 12/29/2017 [place] 12/29/2017 1348  LABSPEC <1.005 (L) 12/29/2017 1348   PHURINE 5.5 12/29/2017 1348   GLUCOSEU NEGATIVE 12/29/2017 1348   HGBUR NEGATIVE 12/29/2017 1348   BILIRUBINUR NEGATIVE 12/29/2017 1348   KETONESUR NEGATIVE 12/29/2017 1348   PROTEINUR NEGATIVE 12/29/2017 1348   UROBILINOGEN 0.2 03/05/2013 1259   NITRITE NEGATIVE 12/29/2017 1348   LEUKOCYTESUR NEGATIVE 12/29/2017 1348   Sepsis Labs: @LABRCNTIP (procalcitonin:4,lacticidven:4) ) Recent Results (from the past 240 hour(s))  Blood Culture (routine x 2)     Status: None (Preliminary result)   Collection Time: 12/29/17 10:30 AM  Result Value Ref Range Status   Specimen Description BLOOD LEFT ANTECUBITAL  Final   Special Requests   Final    BOTTLES DRAWN AEROBIC AND ANAEROBIC Blood Culture adequate volume   Culture   Final    NO GROWTH 4 DAYS Performed at [facility], [place] [address] [place], [place] [address]    Report Status PENDING  Incomplete  Blood Culture (routine x 2)     Status: None (Preliminary result)   Collection Time: 12/29/17 10:30 AM  Result Value Ref Range Status   Specimen Description BLOOD RIGHT ANTECUBITAL  Final   Special Requests   Final    BOTTLES DRAWN AEROBIC AND ANAEROBIC Blood Culture adequate volume   Culture   Final    NO GROWTH 4 DAYS Performed at [facility], [place] [address], [place], [place] [address]      Report Status PENDING  Incomplete  Urine culture     Status: None   Collection Time: 12/29/17  1:48 PM  Result Value Ref Range Status   Specimen Description URINE, RANDOM  Final   Special Requests NONE  Final   Culture   Final    NO GROWTH Performed at [facility], [address], [place], [place] [address]    Report Status 12/30/2017 FINAL  Final         Radiology Studies: No results found.    Scheduled Meds: . apixaban  5 mg Oral Q12H  . buPROPion  450 mg Oral Daily  . carvedilol  12.5 mg Oral BID WC  . docusate sodium  100 mg Oral BID  . donepezil  10 mg Oral QHS  . doxycycline  100 mg Oral Q12H  . furosemide  20 mg Oral Daily  . gabapentin  300 mg Oral BID  . iron polysaccharides  150 mg Oral Daily  . losartan  50 mg Oral Daily  . pantoprazole  40 mg Oral Daily  . pravastatin  40 mg Oral QPM  . sertraline  12.5 mg Oral BID   Continuous Infusions: . ciprofloxacin    . lactated ringers Stopped (01/01/18 1206)     LOS: 3 days    Time spent in minutes: 35     Debbe Odea, MD [facility] Pager: [email or web address] Password [email or web address] 01/02/2018, 2:02 PM

## 2018-01-02 NOTE — Care Management Important Message (Signed)
Important Message  Patient Details  Name: Susan [name] MRN: [id number] Date of Birth: January 12, 1940   Medicare Important Message Given:  Yes    Barb Merino Nicholad Kautzman 01/02/2018, 4:50 PM

## 2018-01-03 DIAGNOSIS — L03116 Cellulitis of left lower limb: Secondary | ICD-10-CM | POA: Diagnosis not present

## 2018-01-03 DIAGNOSIS — N183 Chronic kidney disease, stage 3 (moderate): Secondary | ICD-10-CM | POA: Diagnosis not present

## 2018-01-03 DIAGNOSIS — M1712 Unilateral primary osteoarthritis, left knee: Secondary | ICD-10-CM | POA: Diagnosis not present

## 2018-01-03 DIAGNOSIS — I110 Hypertensive heart disease with heart failure: Secondary | ICD-10-CM | POA: Diagnosis not present

## 2018-01-03 DIAGNOSIS — R4189 Other symptoms and signs involving cognitive functions and awareness: Secondary | ICD-10-CM | POA: Diagnosis not present

## 2018-01-03 DIAGNOSIS — F320 Major depressive disorder, single episode, mild: Secondary | ICD-10-CM | POA: Diagnosis not present

## 2018-01-03 DIAGNOSIS — M6281 Muscle weakness (generalized): Secondary | ICD-10-CM | POA: Diagnosis not present

## 2018-01-03 DIAGNOSIS — R41841 Cognitive communication deficit: Secondary | ICD-10-CM | POA: Diagnosis not present

## 2018-01-03 DIAGNOSIS — L02419 Cutaneous abscess of limb, unspecified: Secondary | ICD-10-CM | POA: Diagnosis not present

## 2018-01-03 DIAGNOSIS — M79605 Pain in left leg: Secondary | ICD-10-CM | POA: Diagnosis not present

## 2018-01-03 DIAGNOSIS — I4819 Other persistent atrial fibrillation: Secondary | ICD-10-CM | POA: Diagnosis not present

## 2018-01-03 DIAGNOSIS — L03119 Cellulitis of unspecified part of limb: Secondary | ICD-10-CM | POA: Diagnosis not present

## 2018-01-03 DIAGNOSIS — I69354 Hemiplegia and hemiparesis following cerebral infarction affecting left non-dominant side: Secondary | ICD-10-CM | POA: Diagnosis not present

## 2018-01-03 DIAGNOSIS — G8911 Acute pain due to trauma: Secondary | ICD-10-CM | POA: Diagnosis not present

## 2018-01-03 DIAGNOSIS — M25511 Pain in right shoulder: Secondary | ICD-10-CM | POA: Diagnosis not present

## 2018-01-03 DIAGNOSIS — R2681 Unsteadiness on feet: Secondary | ICD-10-CM | POA: Diagnosis not present

## 2018-01-03 DIAGNOSIS — I5032 Chronic diastolic (congestive) heart failure: Secondary | ICD-10-CM | POA: Diagnosis not present

## 2018-01-03 DIAGNOSIS — R278 Other lack of coordination: Secondary | ICD-10-CM | POA: Diagnosis not present

## 2018-01-03 DIAGNOSIS — I480 Paroxysmal atrial fibrillation: Secondary | ICD-10-CM | POA: Diagnosis not present

## 2018-01-03 DIAGNOSIS — M2550 Pain in unspecified joint: Secondary | ICD-10-CM | POA: Diagnosis not present

## 2018-01-03 DIAGNOSIS — R2242 Localized swelling, mass and lump, left lower limb: Secondary | ICD-10-CM | POA: Diagnosis not present

## 2018-01-03 DIAGNOSIS — Z79899 Other long term (current) drug therapy: Secondary | ICD-10-CM | POA: Diagnosis not present

## 2018-01-03 DIAGNOSIS — Z7401 Bed confinement status: Secondary | ICD-10-CM | POA: Diagnosis not present

## 2018-01-03 DIAGNOSIS — R600 Localized edema: Secondary | ICD-10-CM | POA: Diagnosis not present

## 2018-01-03 DIAGNOSIS — M79662 Pain in left lower leg: Secondary | ICD-10-CM | POA: Diagnosis not present

## 2018-01-03 DIAGNOSIS — R2689 Other abnormalities of gait and mobility: Secondary | ICD-10-CM | POA: Diagnosis not present

## 2018-01-03 LAB — CULTURE, BLOOD (ROUTINE X 2)
Culture: NO GROWTH
Culture: NO GROWTH
SPECIAL REQUESTS: ADEQUATE
Special Requests: ADEQUATE

## 2018-01-03 LAB — CREATININE, SERUM
Creatinine, Ser: 1.17 mg/dL — ABNORMAL HIGH (ref 0.44–1.00)
GFR calc Af Amer: 51 mL/min — ABNORMAL LOW (ref >60)
GFR calc non Af Amer: 44 mL/min — ABNORMAL LOW (ref >60)

## 2018-01-03 MED ORDER — TRAMADOL HCL 50 MG PO TABS: 50.0000 mg | ORAL_TABLET | Freq: Four times a day (QID) | ORAL | 0 refills | Status: DC | PRN

## 2018-01-03 MED ORDER — DOXYCYCLINE HYCLATE 100 MG PO TABS: 100.0000 mg | ORAL_TABLET | Freq: Two times a day (BID) | ORAL | Status: DC

## 2018-01-03 MED ORDER — CIPROFLOXACIN HCL 500 MG PO TABS: 500.0000 mg | ORAL_TABLET | Freq: Two times a day (BID) | ORAL | 0 refills | Status: AC

## 2018-01-03 NOTE — Progress Notes (Signed)
Report given to  [place] at receiving facility. Pt is awaiting [facility] for transport. Daughter spoke to MD and is aware of discharge and plan of care.

## 2018-01-03 NOTE — Progress Notes (Signed)
Susan [name] to be D/C'd Skilled nursing facility per MD order.  Discussed prescriptions and follow up appointments with the patient. Prescriptions given to patient, medication list explained in detail. Pt verbalized understanding.  Allergies as of 01/03/2018      Reactions   Ace Inhibitors Cough   Lipitor [atorvastatin] Swelling   Simvastatin Other (See Comments)   Myalgias    Latex Itching      Medication List    TAKE these medications   acetaminophen 500 MG tablet Commonly known as:  TYLENOL Take 2 tablets (1,000 mg total) by mouth every 6 (six) hours as needed for moderate pain. Take 1000 mg tablet twice daily in the morning and evening and take 1 tablet ( 500 mg) by mouth daily at 2 pm. What changed:    how much to take  when to take this   apixaban 5 MG Tabs tablet Commonly known as:  ELIQUIS Take 1 tablet (5 mg total) by mouth every 12 (twelve) hours.   buPROPion 150 MG 24 hr tablet Commonly known as:  WELLBUTRIN XL Take 450 mg by mouth daily.   Calcium Carbonate-Vitamin D 600-400 MG-UNIT tablet Take 1 tablet by mouth 2 (two) times daily.   carvedilol 12.5 MG tablet Commonly known as:  COREG Take 12.5 mg by mouth 2 (two) times daily with a meal.   cholecalciferol 25 MCG (1000 UT) tablet Commonly known as:  VITAMIN D Take 1,000 Units by mouth daily.   ciprofloxacin 500 MG tablet Commonly known as:  CIPRO Take 1 tablet (500 mg total) by mouth 2 (two) times daily for 10 days.   cyclobenzaprine 5 MG tablet Commonly known as:  FLEXERIL Take 5 mg by mouth 3 (three) times daily as needed for muscle spasms.   diclofenac sodium 1 % Gel Commonly known as:  VOLTAREN Apply 2 g topically 4 (four) times daily.   docusate sodium 100 MG capsule Commonly known as:  COLACE Take 100 mg by mouth daily.   donepezil 10 MG tablet Commonly known as:  ARICEPT Take 1 tablet (10 mg total) by mouth at bedtime. For memory loss   doxycycline 100 MG tablet Commonly known as:   VIBRA-TABS Take 1 tablet (100 mg total) by mouth every 12 (twelve) hours.   furosemide 20 MG tablet Commonly known as:  LASIX Take 20 mg by mouth daily.   gabapentin 300 MG capsule Commonly known as:  NEURONTIN Take 300 mg by mouth 2 (two) times daily.   IFEREX 150 150 MG capsule Generic drug:  iron polysaccharides Take 150 mg by mouth daily.   losartan 50 MG tablet Commonly known as:  COZAAR Take 1 tablet (50 mg total) by mouth daily.   Menthol (Topical Analgesic) 4 % Gel Commonly known as:  BIOFREEZE Apply 3 oz topically 3 (three) times daily. For knee pain   multivitamin with minerals Tabs tablet Take 1 tablet by mouth daily.   pantoprazole 40 MG tablet Commonly known as:  PROTONIX Take 40 mg by mouth daily.   pravastatin 40 MG tablet Commonly known as:  PRAVACHOL Take 40 mg by mouth every evening.   saccharomyces boulardii 250 MG capsule Commonly known as:  FLORASTOR Take 1 capsule (250 mg total) by mouth 2 (two) times daily.   sertraline 25 MG tablet Commonly known as:  ZOLOFT Take 0.5 tablets (12.5 mg total) by mouth 2 (two) times daily.   traMADol 50 MG tablet Commonly known as:  ULTRAM Take 1 tablet (50 mg total)  by mouth every 6 (six) hours as needed. What changed:    when to take this  reasons to take this   vitamin B-12 1000 MCG tablet Commonly known as:  CYANOCOBALAMIN Take 2,000 mcg by mouth daily.       Vitals:   01/02/18 0501 01/02/18 0815  BP: (!) 147/87   Pulse: 66 70  Resp: 17   Temp: 98.1 F (36.7 C)   SpO2: 98%     Skin clean, dry and intact without evidence of skin break down, no evidence of skin tears noted. Left foot and ankle clean, dry, and intact, dressing changed today 01/03/18.  IV catheter discontinued intact. Site without signs and symptoms of complications. Dressing and pressure applied. Pt denies pain at this time. No complaints noted.  An After Visit Summary and prescriptions were printed and given to  [facility]. Patient escorted via bed, and D/C home via [facility] to SNF.  [place] RN

## 2018-01-03 NOTE — Progress Notes (Signed)
CSW received a call from provider at ph: [phone] and informed pt is ready for D/C.  Pt is from [facility].  CSW called [facility] and spoke to [place] at ph: [phone] who stated pt will have to go to a semi-private room until Monday because pt wasn't expected from the facility until Monday.  OF NOTE: PROVIDER LEFT PT'S NARCOTIC PRESCRIPTION ON PT'S CHART.   CSW received a call from [facility] has been accepted for return by: Patrick Jupiter Number for report is: [phone] Pt's unit/room/bed number will be: [id number] Accepting physician: SNF MD  Pt can arrive ASAP on 01/03/18   CSW updated RN and EDP.  Alphonse Guild. Thatcher Doberstein, LCSW, LCAS, CSI Clinical Social Worker Ph: [phone]

## 2018-01-03 NOTE — Discharge Summary (Addendum)
Physician Discharge Summary  Susan [name] PZW:[id number] DOB: 1940-10-16 DOA: 12/29/2017  PCP: Lauree Chandler, NP  Admit date: 12/29/2017 Discharge date: 01/03/2018  Admitted From: SNF Disposition:  SNF   Recommendations for Outpatient Follow-up:  1. Left leg must be elevated above the level of the heart as much as possible to control edema- if able, place ACE wrap for mild compression when not elevated 2.  Wound care of left leg: Apply double folded xeroform gauze to left ankle wound Q day, then cover with abd pad and kerlex 3. Recommend wound care follow up at SNF 4 Recommend 5-7 days of Doxycycline and Ciprofloxacin based on physical exam finding  Discharge Condition:  stable   CODE STATUS:  Full code   Diet recommendation:  Heart healthy Consultations:  none   Discharge Diagnoses:  Principal Problem:   Cellulitis and abscess of leg Active Problems:   Hypertensive heart disease   Chronic diastolic (congestive) heart failure (HCC)   Persistent atrial fibrillation   History of CVA (cerebrovascular accident)   CKD (chronic kidney disease) stage 3, GFR 30-59 ml/min (HCC)      Brief Summary: primary hyperparathyroidism, hyperlipidemia, chronic back pain, chronic diastolic CHF comes to the ER with complaint of worsening left leg redness, pain. Per family [facility] it burst and started oozing-  they were not able to see doctoron the weekend. Temp was 102.   Patient was recently admitted from 12/15to 12/26for sepsis due to left lower extremity cellulitis along with acute metabolic encephalopathy,was discharged on Augmentin for 7 days.  In the ER,found to have cellulitis,with purulent fluid draining from the left medial leg,  fever 101.7.   - given vancomycin and was admitted. Picture on admission.     Hospital Course:  Principal Problem:   Cellulitis and abscess of leg - failed Augmentin alone which she was  taking for many days - given Vanc and Zosyn with improvement noted- switched to Doxy and Cipro and monitored overnight- she continues to improve- no mental status changes with Cipro noted - I would recommend at least 5-7 days more of antibiotics and strict elevation and ACE wraps when in dependent position as the leg becomes edematous and painful quickly  Active Problems:   Hypertensive heart disease   Chronic diastolic (congestive) heart failure  - cont Coreg, lasix at outpt doses    Persistent atrial fibrillation - cont Eliquis and Coreg    CKD (chronic kidney disease) stage 3, GFR 30-59 ml/min (HCC) - stable   Anemia - chronic   Discharge Exam: Vitals:   01/02/18 0501 01/02/18 0815  BP: (!) 147/87   Pulse: 66 70  Resp: 17   Temp: 98.1 F (36.7 C)   SpO2: 98%    Vitals:   01/01/18 1417 01/01/18 2010 01/02/18 0501 01/02/18 0815  BP: (!) 151/71 124/74 (!) 147/87   Pulse: 65 70 66 70  Resp: 17 16 17    Temp: 98.4 F (36.9 C) 98.8 F (37.1 C) 98.1 F (36.7 C)   TempSrc: Oral Oral Oral   SpO2: 100% 91% 98%   Weight:      Height:        General: Pt is alert, awake, not in acute distress Cardiovascular: RRR, S1/S2 +, no rubs, no gallops Respiratory: CTA bilaterally, no wheezing, no rhonchi Abdominal: Soft, NT, ND, bowel sounds + Extremities: no edema, no cyanosis     Discharge Instructions   Allergies as of 01/03/2018  Reactions   Ace Inhibitors Cough   Lipitor [atorvastatin] Swelling   Simvastatin Other (See Comments)   Myalgias    Latex Itching      Medication List    TAKE these medications   acetaminophen 500 MG tablet Commonly known as:  TYLENOL Take 2 tablets (1,000 mg total) by mouth every 6 (six) hours as needed for moderate pain. Take 1000 mg tablet twice daily in the morning and evening and take 1 tablet ( 500 mg) by mouth daily at 2 pm. What changed:    how much to take  when to take this   apixaban 5 MG Tabs tablet Commonly  known as:  ELIQUIS Take 1 tablet (5 mg total) by mouth every 12 (twelve) hours.   buPROPion 150 MG 24 hr tablet Commonly known as:  WELLBUTRIN XL Take 450 mg by mouth daily.   Calcium Carbonate-Vitamin D 600-400 MG-UNIT tablet Take 1 tablet by mouth 2 (two) times daily.   carvedilol 12.5 MG tablet Commonly known as:  COREG Take 12.5 mg by mouth 2 (two) times daily with a meal.   cholecalciferol 25 MCG (1000 UT) tablet Commonly known as:  VITAMIN D Take 1,000 Units by mouth daily.   ciprofloxacin 500 MG tablet Commonly known as:  CIPRO Take 1 tablet (500 mg total) by mouth 2 (two) times daily for 10 days.   cyclobenzaprine 5 MG tablet Commonly known as:  FLEXERIL Take 5 mg by mouth 3 (three) times daily as needed for muscle spasms.   diclofenac sodium 1 % Gel Commonly known as:  VOLTAREN Apply 2 g topically 4 (four) times daily.   docusate sodium 100 MG capsule Commonly known as:  COLACE Take 100 mg by mouth daily.   donepezil 10 MG tablet Commonly known as:  ARICEPT Take 1 tablet (10 mg total) by mouth at bedtime. For memory loss   doxycycline 100 MG tablet Commonly known as:  VIBRA-TABS Take 1 tablet (100 mg total) by mouth every 12 (twelve) hours.   furosemide 20 MG tablet Commonly known as:  LASIX Take 20 mg by mouth daily.   gabapentin 300 MG capsule Commonly known as:  NEURONTIN Take 300 mg by mouth 2 (two) times daily.   IFEREX 150 150 MG capsule Generic drug:  iron polysaccharides Take 150 mg by mouth daily.   losartan 50 MG tablet Commonly known as:  COZAAR Take 1 tablet (50 mg total) by mouth daily.   Menthol (Topical Analgesic) 4 % Gel Commonly known as:  BIOFREEZE Apply 3 oz topically 3 (three) times daily. For knee pain   multivitamin with minerals Tabs tablet Take 1 tablet by mouth daily.   pantoprazole 40 MG tablet Commonly known as:  PROTONIX Take 40 mg by mouth daily.   pravastatin 40 MG tablet Commonly known as:  PRAVACHOL Take  40 mg by mouth every evening.   saccharomyces boulardii 250 MG capsule Commonly known as:  FLORASTOR Take 1 capsule (250 mg total) by mouth 2 (two) times daily.   sertraline 25 MG tablet Commonly known as:  ZOLOFT Take 0.5 tablets (12.5 mg total) by mouth 2 (two) times daily.   traMADol 50 MG tablet Commonly known as:  ULTRAM Take 1 tablet (50 mg total) by mouth every 6 (six) hours as needed. What changed:    when to take this  reasons to take this   vitamin B-12 1000 MCG tablet Commonly known as:  CYANOCOBALAMIN Take 2,000 mcg by mouth daily.  Allergies  Allergen Reactions  . Ace Inhibitors Cough  . Lipitor [Atorvastatin] Swelling  . Simvastatin Other (See Comments)    Myalgias     . Latex Itching     Procedures/Studies:    Dg Chest 2 View  Result Date: 12/21/2017 CLINICAL DATA:  Fell at home, UTI EXAM: CHEST - 2 VIEW COMPARISON:  11/21/2015 FINDINGS: Enlargement of cardiac silhouette with pulmonary vascular congestion. Atherosclerotic calcification aorta. Interstitial prominence in both lungs increased since previous exam question minimal failure. No segmental consolidation, pleural effusion or pneumothorax. Bones diffusely demineralized. IMPRESSION: Enlargement of cardiac silhouette with pulmonary vascular congestion and question minimal failure. Electronically Signed   By: Lavonia Dana M.D.   On: 12/21/2017 16:52   Dg Ankle Complete Left  Result Date: 12/22/2017 CLINICAL DATA:  Recent fall at home with swelling and tenderness of the left ankle with difficulty walking. EXAM: LEFT ANKLE COMPLETE - 3+ VIEW COMPARISON:  Left ankle series dated April 13, 2017 FINDINGS: The bones are subjectively adequately mineralized. The ankle joint mortise is preserved. The talar dome is intact. There is no acute malleolar fracture. The talus and calcaneus are intact. There are plantar and Achilles region calcaneal spurs. The observed portions of the more distal tarsal bones  appear normal. There are soft tissue calcifications most compatible with phleboliths. There is diffuse soft tissue calcification. IMPRESSION: There is no acute or significant chronic bony abnormality of the left ankle. There is diffuse soft tissue swelling. Electronically Signed   By: David  Martinique M.D.   On: 12/22/2017 10:34   Ct Head Wo Contrast  Result Date: 12/21/2017 CLINICAL DATA:  Unwitnessed fall EXAM: CT HEAD WITHOUT CONTRAST CT CERVICAL SPINE WITHOUT CONTRAST TECHNIQUE: Multidetector CT imaging of the head and cervical spine was performed following the standard protocol without intravenous contrast. Multiplanar CT image reconstructions of the cervical spine were also generated. COMPARISON:  Head CT 01/25/2015 FINDINGS: CT HEAD FINDINGS Brain: There is no mass, hemorrhage or extra-axial collection. There is generalized atrophy without lobar predilection. There is hypoattenuation of the periventricular white matter, most commonly indicating chronic ischemic microangiopathy. Vascular: No abnormal hyperdensity of the major intracranial arteries or dural venous sinuses. No intracranial atherosclerosis. Skull: The visualized skull base, calvarium and extracranial soft tissues are normal. Sinuses/Orbits: Moderate paranasal sinus opacification. No fluid levels. The orbits are normal. CT CERVICAL SPINE FINDINGS Alignment: Reversal of normal cervical lordosis. Grade 1 C2-3 anterolisthesis. Facet alignment is normal. Occipital condyles are aligned with the lateral masses of C1 and C2. Skull base and vertebrae: No acute fracture. Soft tissues and spinal canal: No prevertebral fluid or swelling. No visible canal hematoma. Disc levels: There is multilevel severe disc space narrowing, predominantly in the upper cervical spine. Uncovertebral hypertrophy contributes to foraminal stenosis greatest at right C3-4, C4-5 and C5-6. No bony spinal canal stenosis. Upper chest: No pneumothorax, pulmonary nodule or pleural  effusion. Other: Normal visualized paraspinal cervical soft tissues. IMPRESSION: 1. Atrophy and chronic ischemic microangiopathy without acute intracranial abnormality. 2. No acute fracture of the cervical spine. Electronically Signed   By: Ulyses Jarred M.D.   On: 12/21/2017 17:16   Ct Cervical Spine Wo Contrast  Result Date: 12/21/2017 CLINICAL DATA:  Unwitnessed fall EXAM: CT HEAD WITHOUT CONTRAST CT CERVICAL SPINE WITHOUT CONTRAST TECHNIQUE: Multidetector CT imaging of the head and cervical spine was performed following the standard protocol without intravenous contrast. Multiplanar CT image reconstructions of the cervical spine were also generated. COMPARISON:  Head CT 01/25/2015 FINDINGS: CT HEAD FINDINGS Brain:  There is no mass, hemorrhage or extra-axial collection. There is generalized atrophy without lobar predilection. There is hypoattenuation of the periventricular white matter, most commonly indicating chronic ischemic microangiopathy. Vascular: No abnormal hyperdensity of the major intracranial arteries or dural venous sinuses. No intracranial atherosclerosis. Skull: The visualized skull base, calvarium and extracranial soft tissues are normal. Sinuses/Orbits: Moderate paranasal sinus opacification. No fluid levels. The orbits are normal. CT CERVICAL SPINE FINDINGS Alignment: Reversal of normal cervical lordosis. Grade 1 C2-3 anterolisthesis. Facet alignment is normal. Occipital condyles are aligned with the lateral masses of C1 and C2. Skull base and vertebrae: No acute fracture. Soft tissues and spinal canal: No prevertebral fluid or swelling. No visible canal hematoma. Disc levels: There is multilevel severe disc space narrowing, predominantly in the upper cervical spine. Uncovertebral hypertrophy contributes to foraminal stenosis greatest at right C3-4, C4-5 and C5-6. No bony spinal canal stenosis. Upper chest: No pneumothorax, pulmonary nodule or pleural effusion. Other: Normal visualized  paraspinal cervical soft tissues. IMPRESSION: 1. Atrophy and chronic ischemic microangiopathy without acute intracranial abnormality. 2. No acute fracture of the cervical spine. Electronically Signed   By: Ulyses Jarred M.D.   On: 12/21/2017 17:16   Ct Tibia Fibula Left W Contrast  Result Date: 12/29/2017 CLINICAL DATA:  Large wound on the distal aspect of the left lower leg and ankle. Subsequent encounter. EXAM: CT OF THE LOWER RIGHT EXTREMITY WITH CONTRAST TECHNIQUE: Multidetector CT imaging of the lower right extremity was performed according to the standard protocol following intravenous contrast administration. COMPARISON:  CT left lower leg 12/25/2017. CONTRAST:  100 mL OMNIPAQUE IOHEXOL 300 MG/ML  SOLN FINDINGS: Bones/Joint/Cartilage No acute abnormality is identified. No bony destructive change or periosteal reaction. Osteophytosis about the knee consistent with degenerative disease is again seen. Ligaments Suboptimally assessed by CT. Muscles and Tendons Fat planes in muscle are preserved. No intramuscular fluid collection or gas tracking along fascial planes is identified. Scattered fatty deposits in muscles could be due to atrophy or less likely lipomas. Soft tissues Subcutaneous edema is present diffusely and appears worst distally. No focal fluid collection is identified. Scattered subcutaneous calcifications are most consistent with prior infectious or inflammatory process. IMPRESSION: No marked change in the appearance of the left lower leg. Diffuse subcutaneous edema is worst distally and consistent with dependent change or cellulitis. Negative for abscess, osteomyelitis or necrotizing fasciitis. Electronically Signed   By: Inge Rise M.D.   On: 12/29/2017 13:58   Ct Ankle Left Wo Contrast  Result Date: 12/25/2017 CLINICAL DATA:  Left lower extremity pain and swelling since a fall several days ago. Initial encounter. EXAM: CT OF THE LOWER LEFT EXTREMITY WITHOUT CONTRAST TECHNIQUE:  Multidetector CT imaging of the lower left extremity was performed according to the standard protocol. COMPARISON:  Plain films left ankle 12/22/2017. FINDINGS: Bones/Joint/Cartilage No acute bony or joint abnormality is seen. No focal bony lesion is identified. Bones appear osteopenic. There is degenerative disease about the knee with joint space narrowing and osteophytosis appearing worst on the medial side. 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 Ligaments Suboptimally assessed by CT. Major ligamentous structures appear intact. Muscles and Tendons No tear is identified. No intramuscular fluid collection is seen. Scattered areas of fatty atrophy versus small, simple lipomas are noted. Soft tissues Scattered calcifications are seen in subcutaneous tissues most notable about the distal lower leg and ankle. Subcutaneous edema is also seen and progressive distally. No focal fluid collection. IMPRESSION: Negative for acute bony or joint abnormality. Diffuse subcutaneous edema is  progressive distally and could be due to dependent change or cellulitis. Scattered punctate calcifications in the subcutaneous tissues are most consistent with prior infectious or inflammatory process. 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 Electronically Signed   By: Inge Rise M.D.   On: 12/25/2017 10:32   Dg Bone Density  Result Date: 12/19/2017 EXAM: DUAL X-RAY ABSORPTIOMETRY (DXA) FOR BONE MINERAL DENSITY IMPRESSION: Referring Physician:  Lauree Chandler Your patient completed a BMD test using Lunar IDXA DXA system ( analysis version: 16 ) manufactured by [employer]. Technologist: KT PATIENT: Name: Susan [name], Susan [name] Patient ID: [id number] Birth Date: 04-13-1940 Height: 60.5 in. Sex: Female Measured: 12/19/2017 Weight: 177.0 lbs. Indications: Advanced Age, Depression, Estrogen Deficient, Family History of Osteoporosis, Gabapentin, Height Loss (781.91),  Postmenopausal, Wellbutrin, Zoloft, Secondary Osteoporosis Fractures: Treatments: Vitamin D (E933.5) ASSESSMENT: The BMD measured at Femur Neck Right is 0.748 g/cm2 with a T-score of -2.1. This patient is considered osteopenic according to [facility]) criteria. The scan quality is good. L-3, L-4 were excluded due to degenerative changes and overlying artifacts. Site Region Measured Date Measured Age YA BMD Significant CHANGE T-score DualFemur Neck Right 10/19/2017    79.7         -2.1    0.748 g/cm2 AP Spine  L1-L2      10/19/2017    79.9         -1.3    1.009 g/cm2 DualFemur Total Mean 12/19/2017    79.9         -1.2    0.855 g/cm2 World Health Organization [facility]) criteria for post-menopausal, Caucasian Women: Normal       T-score at or above -1 SD Osteopenia   T-score between -1 and -2.5 SD Osteoporosis T-score at or below -2.5 SD RECOMMENDATION: 1. All patients should optimize calcium and vitamin D intake. 2. Consider FDA approved medical therapies in postmenopausal women and men aged 62 years and older, based on the following: a. A hip or vertebral (clinical or morphometric) fracture b. T- score < or = -2.5 at the femoral neck or spine after appropriate evaluation to exclude secondary causes c. Low bone mass (T-score between -1.0 and -2.5 at the femoral neck or spine) and a 10 year probability of a hip fracture > or = 3% or a 10 year probability of a major osteoporosis-related fracture > or = 20% based on the [place]-adapted WHO algorithm d. Clinician judgment and/or patient preferences may indicate treatment for people with 10-year fracture probabilities above or below these levels FOLLOW-UP: People with diagnosed cases of osteoporosis or at high risk for fracture should have regular bone mineral density tests. For patients eligible for Medicare, routine testing is allowed once every 2 years. The testing frequency can be increased to one year for patients who have rapidly progressing disease,  those who are receiving or discontinuing medical therapy to restore bone mass, or have additional risk factors. I have reviewed this report and agree with the above findings. [facility] FRAX* 10-year Probability of Fracture Based on femoral neck BMD: DualFemur (Right) Major Osteoporotic Fracture: 7.0% Hip Fracture:                2.0% Population:                  [place] (Black) Risk Factors:                Secondary Osteoporosis *FRAX is a Materials engineer of the  [employer] for Metabolic Bone Disease, a World Pharmacologist (WHO) [facility]. ASSESSMENT: The probability of a major osteoporotic fracture is 7.0 % within the next ten years. The probability of hip fracture is 2.0 % within the next 10 years. Electronically Signed   By: Dorise Bullion III M.D   On: 12/19/2017 16:25   Dg Chest Portable 1 View  Result Date: 12/29/2017 CLINICAL DATA:  Left lower extremity pain and swelling.  Fever. EXAM: PORTABLE CHEST 1 VIEW COMPARISON:  12/21/2017 FINDINGS: Stable cardiac enlargement and stable tortuosity and calcification of the thoracic aorta. Persistent central vascular congestion without overt pulmonary edema. No infiltrates or effusions. The bony thorax is intact. Remote surgical changes involving the right shoulder. IMPRESSION: Stable cardiac enlargement with central vascular congestion but no infiltrates, edema or effusions. Electronically Signed   By: Marijo Sanes M.D.   On: 12/29/2017 10:56   Dg Knee Complete 4 Views Left  Result Date: 12/21/2017 CLINICAL DATA:  Unwitnessed fall at home, generalized LEFT knee pain EXAM: LEFT KNEE - COMPLETE 4+ VIEW COMPARISON:  09/26/2017 FINDINGS: Osseous demineralization. Diffuse joint space narrowing and spur formation greatest at lateral compartment. Subchondral sclerosis at lateral compartment. No acute fracture, dislocation, or bone destruction. No knee joint effusion. Small patellar spur at quadriceps tendon  insertion. IMPRESSION: Osseous demineralization with degenerative changes LEFT knee. No acute abnormalities. Electronically Signed   By: Lavonia Dana M.D.   On: 12/21/2017 16:51   Dg Tibia/fibula Left Port  Result Date: 12/29/2017 CLINICAL DATA:  Left lower extremity pain and swelling. EXAM: PORTABLE LEFT TIBIA AND FIBULA - 2 VIEW COMPARISON:  CT scan 12/25/2017 FINDINGS: Moderate degenerative changes involving the knee and ankle but no acute bony findings or destructive bony lesions. Small lucency noted in the mid tibia medially has the appearance of a nutrient vessel on the recent prior CT scan. Significant subcutaneous soft tissue swelling/edema/fluid. There are also extensive calcifications noted in the subcutaneous fat possibly reflecting chronic venous stasis, chronic cellulitis or process such as dermatomyositis. IMPRESSION: No acute bony findings. Diffuse subcutaneous soft tissue swelling/edema/fluid. Electronically Signed   By: Marijo Sanes M.D.   On: 12/29/2017 11:01   Ct Extremity Lower Left Wo Contrast  Result Date: 12/25/2017 CLINICAL DATA:  Left lower extremity pain and swelling since a fall several days ago. Initial encounter. EXAM: CT OF THE LOWER LEFT EXTREMITY WITHOUT CONTRAST TECHNIQUE: Multidetector CT imaging of the lower left extremity was performed according to the standard protocol. COMPARISON:  Plain films left ankle 12/22/2017. FINDINGS: Bones/Joint/Cartilage No acute bony or joint abnormality is seen. No focal bony lesion is identified. Bones appear osteopenic. There is degenerative disease about the knee with joint space narrowing and osteophytosis appearing worst on the medial side. 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 Ligaments Suboptimally assessed by CT. Major ligamentous structures appear intact. Muscles and Tendons No tear is identified. No intramuscular fluid collection is seen. Scattered areas of fatty atrophy versus small, simple lipomas are noted. Soft  tissues Scattered calcifications are seen in subcutaneous tissues most notable about the distal lower leg and ankle. Subcutaneous edema is also seen and progressive distally. No focal fluid collection. IMPRESSION: Negative for acute bony or joint abnormality. Diffuse subcutaneous edema is progressive distally and could be due to dependent change or cellulitis. Scattered punctate calcifications in the subcutaneous tissues are most consistent with prior infectious or inflammatory process. 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 Electronically Signed  By: Inge Rise M.D.   On: 12/25/2017 10:32   Dg Hips Bilat With Pelvis 3-4 Views  Result Date: 12/21/2017 CLINICAL DATA:  Bilateral hip pain since a fall tonight. Initial encounter. EXAM: DG HIP (WITH OR WITHOUT PELVIS) 3-4V BILAT COMPARISON:  None. FINDINGS: There is no evidence of hip fracture or dislocation. There is no evidence of arthropathy or other focal bone abnormality. Mesh from hernia repair noted. IMPRESSION: Negative exam. Electronically Signed   By: Inge Rise M.D.   On: 12/21/2017 20:26   Vas [place] Lower Extremity Venous (dvt) (only [facility])  Result Date: 12/21/2017  Lower Venous Study Indications: Swelling, and Edema.  Performing Technologist: Abram Sander RVS  Examination Guidelines: A complete evaluation includes B-mode imaging, spectral Doppler, color Doppler, and power Doppler as needed of all accessible portions of each vessel. Bilateral testing is considered an integral part of a complete examination. Limited examinations for reoccurring indications may be performed as noted.  Right Venous Findings: +---+---------------+---------+-----------+----------+-------+    CompressibilityPhasicitySpontaneityPropertiesSummary +---+---------------+---------+-----------+----------+-------+ CFVFull           Yes      Yes                           +---+---------------+---------+-----------+----------+-------+  Left Venous Findings: +---------+---------------+---------+-----------+----------+--------------+          CompressibilityPhasicitySpontaneityPropertiesSummary        +---------+---------------+---------+-----------+----------+--------------+ CFV      Full           Yes      Yes                                 +---------+---------------+---------+-----------+----------+--------------+ SFJ      Full                                                        +---------+---------------+---------+-----------+----------+--------------+ FV Prox  Full                                                        +---------+---------------+---------+-----------+----------+--------------+ FV Mid   Full                                                        +---------+---------------+---------+-----------+----------+--------------+ FV DistalFull                                                        +---------+---------------+---------+-----------+----------+--------------+ PFV      Full                                                        +---------+---------------+---------+-----------+----------+--------------+  POP      Full           Yes      Yes                                 +---------+---------------+---------+-----------+----------+--------------+ PTV      Full                                                        +---------+---------------+---------+-----------+----------+--------------+ PERO                                                  Not visualized +---------+---------------+---------+-----------+----------+--------------+    Summary: Right: No evidence of common femoral vein obstruction. Left: There is no evidence of deep vein thrombosis in the lower extremity. No cystic structure found in the popliteal fossa.  *See table(s) above for measurements and observations. Electronically  signed by Deitra Mayo MD on 12/21/2017 at 5:51:56 PM.    Final      The results of significant diagnostics from this hospitalization (including imaging, microbiology, ancillary and laboratory) are listed below for reference.     Microbiology: Recent Results (from the past 240 hour(s))  Blood Culture (routine x 2)     Status: None   Collection Time: 12/29/17 10:30 AM  Result Value Ref Range Status   Specimen Description BLOOD LEFT ANTECUBITAL  Final   Special Requests   Final    BOTTLES DRAWN AEROBIC AND ANAEROBIC Blood Culture adequate volume   Culture   Final    NO GROWTH 5 DAYS Performed at [facility], [address], [place], [place] [address]    Report Status 01/03/2018 FINAL  Final  Blood Culture (routine x 2)     Status: None   Collection Time: 12/29/17 10:30 AM  Result Value Ref Range Status   Specimen Description BLOOD RIGHT ANTECUBITAL  Final   Special Requests   Final    BOTTLES DRAWN AEROBIC AND ANAEROBIC Blood Culture adequate volume   Culture   Final    NO GROWTH 5 DAYS Performed at [facility], [place] [address], [place], [place] [address]    Report Status 01/03/2018 FINAL  Final  Urine culture     Status: None   Collection Time: 12/29/17  1:48 PM  Result Value Ref Range Status   Specimen Description URINE, RANDOM  Final   Special Requests NONE  Final   Culture   Final    NO GROWTH Performed at [facility], [place] [address], [place], [place] [address]    Report Status 12/30/2017 FINAL  Final     Labs: BNP (last 3 results) No results for input(s): BNP in the last 8760 hours. Basic Metabolic Panel: Recent Labs  Lab 12/29/17 1025 12/30/17 0322 12/31/17 0348 01/03/18 0338  NA 136 135 140  --   K 3.9 3.7 4.0  --   CL 103 100 102  --   CO2 25 24 26   --   GLUCOSE 117* 100* 89  --   BUN 13 14 14   --   CREATININE 1.26* 1.30* 1.21*  1.17*  CALCIUM 9.7 9.5 9.6  --    Liver Function Tests: Recent Labs  Lab 12/29/17 1025  AST  38  ALT 33  ALKPHOS 76  BILITOT 1.2  PROT 6.0*  ALBUMIN 2.2*   No results for input(s): LIPASE, AMYLASE in the last 168 hours. No results for input(s): AMMONIA in the last 168 hours. CBC: Recent Labs  Lab 12/29/17 1025 12/30/17 0322 12/31/17 0348 01/01/18 0900  WBC 9.5 9.5 9.7 9.3  NEUTROABS 6.8  --   --   --   HGB 8.6* 8.0* 7.6* 8.0*  HCT 27.3* 24.8* 23.9* 24.5*  MCV 94.5 91.2 92.3 91.4  PLT 380 369 412* 418*   Cardiac Enzymes: No results for input(s): CKTOTAL, CKMB, CKMBINDEX, TROPONINI in the last 168 hours. BNP: Invalid input(s): POCBNP CBG: No results for input(s): GLUCAP in the last 168 hours. D-Dimer No results for input(s): DDIMER in the last 72 hours. Hgb A1c No results for input(s): HGBA1C in the last 72 hours. Lipid Profile No results for input(s): CHOL, HDL, LDLCALC, TRIG, CHOLHDL, LDLDIRECT in the last 72 hours. Thyroid function studies No results for input(s): TSH, T4TOTAL, T3FREE, THYROIDAB in the last 72 hours.  Invalid input(s): FREET3 Anemia work up No results for input(s): VITAMINB12, FOLATE, FERRITIN, TIBC, IRON, RETICCTPCT in the last 72 hours. Urinalysis    Component Value Date/Time   COLORURINE YELLOW 12/29/2017 [place] 12/29/2017 1348   LABSPEC <1.005 (L) 12/29/2017 1348   PHURINE 5.5 12/29/2017 1348   GLUCOSEU NEGATIVE 12/29/2017 1348   HGBUR NEGATIVE 12/29/2017 1348   BILIRUBINUR NEGATIVE 12/29/2017 1348   KETONESUR NEGATIVE 12/29/2017 1348   PROTEINUR NEGATIVE 12/29/2017 1348   UROBILINOGEN 0.2 03/05/2013 1259   NITRITE NEGATIVE 12/29/2017 1348   LEUKOCYTESUR NEGATIVE 12/29/2017 1348   Sepsis Labs Invalid input(s): PROCALCITONIN,  WBC,  LACTICIDVEN Microbiology Recent Results (from the past 240 hour(s))  Blood Culture (routine x 2)     Status: None   Collection Time: 12/29/17 10:30 AM  Result Value Ref Range Status   Specimen Description BLOOD LEFT ANTECUBITAL  Final   Special Requests   Final    BOTTLES  DRAWN AEROBIC AND ANAEROBIC Blood Culture adequate volume   Culture   Final    NO GROWTH 5 DAYS Performed at [facility], [place] [address] [place], [place] [address]    Report Status 01/03/2018 FINAL  Final  Blood Culture (routine x 2)     Status: None   Collection Time: 12/29/17 10:30 AM  Result Value Ref Range Status   Specimen Description BLOOD RIGHT ANTECUBITAL  Final   Special Requests   Final    BOTTLES DRAWN AEROBIC AND ANAEROBIC Blood Culture adequate volume   Culture   Final    NO GROWTH 5 DAYS Performed at [facility], [place] [address] [place], [place] [address]    Report Status 01/03/2018 FINAL  Final  Urine culture     Status: None   Collection Time: 12/29/17  1:48 PM  Result Value Ref Range Status   Specimen Description URINE, RANDOM  Final   Special Requests NONE  Final   Culture   Final    NO GROWTH Performed at [facility], [place] [address], [place], [place] [address]    Report Status 12/30/2017 FINAL  Final     Time coordinating discharge in minutes: 65  SIGNED:   Debbe Odea, MD  [facility] 01/03/2018, 9:46 AM Pager  If 7PM-7AM, please contact night-coverage [email or web address] Password [email or web address]

## 2018-01-03 NOTE — Progress Notes (Signed)
CSW called [facility] and scheduled a pick up at approx 12:45pm, per RN's request.  RN updated.  CSW to bring pt's D/C packet to floor.  Please reconsult if future social work needs arise.  CSW signing off, as social work intervention is no longer needed.  Alphonse Guild. Audryna Wendt, LCSW, LCAS, CSI Clinical Social Worker Ph: [phone]

## 2018-01-03 NOTE — Plan of Care (Signed)
  Problem: Pain Managment: Goal: General experience of comfort will improve Outcome: Progressing   Problem: Safety: Goal: Ability to remain free from injury will improve Outcome: Progressing   Problem: Skin Integrity: Goal: Risk for impaired skin integrity will decrease Outcome: Progressing   

## 2018-01-03 NOTE — Plan of Care (Signed)
  Problem: Clinical Measurements: °Goal: Ability to avoid or minimize complications of infection will improve °Outcome: Progressing °  °Problem: Skin Integrity: °Goal: Skin integrity will improve °Outcome: Progressing °  °Problem: Health Behavior/Discharge Planning: °Goal: Ability to manage health-related needs will improve °Outcome: Progressing °  °

## 2018-01-03 NOTE — Clinical Social Work Note (Signed)
Clinical Social Work Assessment  Patient Details  Name: Susan [name] MRN: [id number] Date of Birth: 1940-03-27  Date of referral:  01/03/18               Reason for consult:                   Permission sought to share information with:  Facility Art therapist granted to share information::  Yes, Verbal Permission Granted  Name::        Agency::     Relationship::     Contact Information:     Housing/Transportation Living arrangements for the past 2 months:  Single Family Home Source of Information:  Patient Patient Interpreter Needed:  None Criminal Activity/Legal Involvement Pertinent to Current Situation/Hospitalization:    Significant Relationships:  Other Family Members Lives with:  Self Do you feel safe going back to the place where you live?  No Need for family participation in patient care:  No (Coment)  Care giving concerns:   CSW received a call from provider at ph: [phone] and informed pt is ready for D/C.  Pt is from [facility].  CSW called [facility] admissions and spoke to [place] at ph: [phone] who stated pt will have to go to a semi-private room until Monday because pt wasn't expected from the facility until Monday.   Social Worker assessment / plan:  CSW spoke with pt and confirmed pt's plan to be discharged back to [facility] at discharge.  CSW provided active listening and validated pt's concerns.   CSW will referrals out to SNF facility for return via the hub per pt's request.  Pt has been at [facility] prior to being admitted to [facility]   Employment status:  Retired Nurse, adult PT Recommendations:  Not assessed at this time Information / Referral to community resources:     Patient/Family's Response to care:  Still assessing   Patient/Family's Understanding of and Emotional Response to Diagnosis, Current Treatment, and Prognosis: Still assessing   Emotional Assessment Appearance:     Attitude/Demeanor/Rapport:    Affect (typically observed):  Accepting, Adaptable, Calm, Pleasant Orientation:  Oriented to Self, Oriented to Place, Oriented to Situation, Oriented to  Time Alcohol / Substance use:    Psych involvement (Current and /or in the community):     Discharge Needs  Concerns to be addressed:  No discharge needs identified Readmission within the last 30 days:  Yes Current discharge risk:  None Barriers to Discharge:  No Barriers Identified   Claudine Mouton, LCSWA 01/03/2018, 11:06 AM

## 2018-01-03 NOTE — Progress Notes (Signed)
Text paged Dr. Wynelle Cleveland about patient concerns of being discharged. Waiting for MD response.

## 2018-01-03 NOTE — Progress Notes (Signed)
CSW awaiting D/C summary from provider before bed number/number for report can be acquired.  CSW will continue to follow for D/C needs.  Alphonse Guild. Layna Roeper, LCSW, LCAS, CSI Clinical Social Worker Ph: [phone]

## 2018-01-03 NOTE — Progress Notes (Signed)
OF NOTE: PROVIDER LEFT PT'S NARCOTIC PRESCRIPTION ON PT'S CHART.  CSW received a call from [place] has been accepted for return by: Patrick Jupiter Number for report is: [phone] Pt's unit/room/bed number will be: [id number] Accepting physician: SNF MD  Pt can arrive ASAP on 01/03/18   CSW updated RN and EDP.

## 2018-01-09 NOTE — Telephone Encounter (Signed)
LMOM to follow up on procedure being scheduled. We have called several times over the last week - will try again next week then will close encounter and await call back.

## 2018-01-12 NOTE — Telephone Encounter (Signed)
LMOM to follow up 

## 2018-01-13 NOTE — Telephone Encounter (Signed)
Spoke with patient and she reports that she is currently in rehab and this procedure is not a priority right now. She is aware to call if/when this is scheduled. Provided her the direct number to pharmacy clinic once she decides to proceed with surgery.

## 2018-01-14 DIAGNOSIS — I5032 Chronic diastolic (congestive) heart failure: Secondary | ICD-10-CM | POA: Diagnosis not present

## 2018-01-14 DIAGNOSIS — F320 Major depressive disorder, single episode, mild: Secondary | ICD-10-CM | POA: Diagnosis not present

## 2018-01-14 DIAGNOSIS — L03116 Cellulitis of left lower limb: Secondary | ICD-10-CM | POA: Diagnosis not present

## 2018-01-14 DIAGNOSIS — M79662 Pain in left lower leg: Secondary | ICD-10-CM | POA: Diagnosis not present

## 2018-01-19 ENCOUNTER — Telehealth: Payer: Self-pay | Admitting: Cardiology

## 2018-01-19 NOTE — Telephone Encounter (Signed)
Sent My-Chart message

## 2018-01-19 NOTE — Telephone Encounter (Signed)
Follow Up:     Pt said she was returning a call from somebody from last week from Dr Theodosia Blender office. She had no idea who called he.

## 2018-01-20 ENCOUNTER — Telehealth: Payer: Self-pay

## 2018-01-20 NOTE — Telephone Encounter (Signed)
Patient had a previous lab appointment this Wednesday 01/21/2018 but no labs were placed. Checked previous note from previous provider. Called patient to see if they would still be able to make lab appointment before putting future orders for labs in. Patient stated she had went to hospital due to a fall and leg cellulitis and was currently in rehab. Patient stated she would not be able to make lab appointment and was not sure when she would be able to come back to office right now. Patient then handed phone over to her daughter Susan [name] who was there in the room.   Susan [name] went on to explain why her mom was in rehab. Stated her mom has been in and out of rehab because of infection and would like to inform Susan [name]. Daughter stated she would like to talk with provider if possible at [phone]. She would like advice on current situation and recommendation on how to care for her mom. Please advise.

## 2018-01-20 NOTE — Telephone Encounter (Signed)
I understand she is in rehab, have her make an appt once she is discharged from rehab to update [place] on her entire stay. She can also have them send [place] her records which would be beneficial

## 2018-01-20 NOTE — Telephone Encounter (Signed)
Called patient's daughter Sharee Pimple and discussed Jessica's recommendations. Daughter was informed that Janett Billow would not be able to go to facility to see patient only after patient was discharged and appointment was made. Daughter stated she was unsure of when her mom will be getting out of rehab and was in need of knee surgery. Daughter stated that her mom would talk to [facility] and sign paper for records to be sent to [facility] for [place] review.

## 2018-01-21 DIAGNOSIS — I5032 Chronic diastolic (congestive) heart failure: Secondary | ICD-10-CM | POA: Diagnosis not present

## 2018-01-21 DIAGNOSIS — I480 Paroxysmal atrial fibrillation: Secondary | ICD-10-CM | POA: Diagnosis not present

## 2018-01-21 DIAGNOSIS — N183 Chronic kidney disease, stage 3 (moderate): Secondary | ICD-10-CM | POA: Diagnosis not present

## 2018-01-21 DIAGNOSIS — L03116 Cellulitis of left lower limb: Secondary | ICD-10-CM | POA: Diagnosis not present

## 2018-01-22 ENCOUNTER — Other Ambulatory Visit: Payer: Self-pay | Admitting: *Deleted

## 2018-01-22 ENCOUNTER — Other Ambulatory Visit: Payer: PPO

## 2018-01-22 DIAGNOSIS — I69354 Hemiplegia and hemiparesis following cerebral infarction affecting left non-dominant side: Secondary | ICD-10-CM | POA: Diagnosis not present

## 2018-01-22 DIAGNOSIS — I5032 Chronic diastolic (congestive) heart failure: Secondary | ICD-10-CM | POA: Diagnosis not present

## 2018-01-22 DIAGNOSIS — R600 Localized edema: Secondary | ICD-10-CM | POA: Diagnosis not present

## 2018-01-22 DIAGNOSIS — R4189 Other symptoms and signs involving cognitive functions and awareness: Secondary | ICD-10-CM | POA: Diagnosis not present

## 2018-01-22 NOTE — Patient Outreach (Signed)
[facility]) Care Management  01/22/2018  Susan [name] 1940/12/09 [id number]   Facility site visit to [facility] to discuss patient progress and transition to home. Facility discharge planner unavailable.  Collaboration with [place] Team member Jari Pigg who was recently updated on patient's progress.  UM Team member stated patient was doing well, is alert and oriented and has a discharge plan to return home where she lives alone.   Visited patient at the bedside, patient was sitting up in wheel chair with Left leg elevated.  Daughter [name] also at bedside, stated she lives in [place].  Patient stated she was at facility for a few days then had to be readmitted to the hospital then came back to the facility.  Patient's daughter concerned related to patient is continuing to be unable to put weight on her left foot and she lives alone. Patient stated before her fall and she was dealing with a Left knee orthopedic issue that is not resolved. Patient states she uses [employer] for transportation, but lives on the 2nd floor of her apartment and has to be able to get to the elevator and to the side walk to be picked up. Patient stated she did fall while at home alone and was unable to reach her phone, gave patient a pamphlet on [name] services and gave patient [facility] with contact information included.  Patient and daughter agreeable to services.  Patient denies issues with med management or cost at this time.  Referral sent [facility] [name] to engage patient for transition of care at [name] discharge.  Since d/c date not set I will monitor for d/c date and make [name] RNCM aware of date.   Made [name] UM Team member aware of [name] CM referral.  [name] RN, [name] [phone]) Business Mobile [phone]) Toll free office

## 2018-01-23 DIAGNOSIS — G8911 Acute pain due to trauma: Secondary | ICD-10-CM | POA: Diagnosis not present

## 2018-01-23 DIAGNOSIS — L03116 Cellulitis of left lower limb: Secondary | ICD-10-CM | POA: Diagnosis not present

## 2018-01-23 DIAGNOSIS — M25511 Pain in right shoulder: Secondary | ICD-10-CM | POA: Diagnosis not present

## 2018-01-26 ENCOUNTER — Ambulatory Visit: Payer: PPO | Admitting: Nurse Practitioner

## 2018-01-26 DIAGNOSIS — L03116 Cellulitis of left lower limb: Secondary | ICD-10-CM | POA: Diagnosis not present

## 2018-01-26 DIAGNOSIS — M1712 Unilateral primary osteoarthritis, left knee: Secondary | ICD-10-CM | POA: Diagnosis not present

## 2018-01-26 DIAGNOSIS — M25511 Pain in right shoulder: Secondary | ICD-10-CM | POA: Diagnosis not present

## 2018-01-26 DIAGNOSIS — R600 Localized edema: Secondary | ICD-10-CM | POA: Diagnosis not present

## 2018-01-27 ENCOUNTER — Telehealth: Payer: Self-pay

## 2018-01-27 DIAGNOSIS — L03116 Cellulitis of left lower limb: Secondary | ICD-10-CM | POA: Diagnosis not present

## 2018-01-27 DIAGNOSIS — I5032 Chronic diastolic (congestive) heart failure: Secondary | ICD-10-CM | POA: Diagnosis not present

## 2018-01-27 NOTE — Telephone Encounter (Signed)
Spoke with patient's daughter Sharee Pimple, she is very concerned about her mom's care. Stated that her mom is being discharged from facility today but is told that she is unsafe discharge. She stated her mom can walk no more than 5 steps with out falling and is a high risk for falls. She is in need of knee surgery. Facility has told them she must discharge today.  They have tried to file an appeal. Sharee Pimple would like to know if patient should go for knee surgery after being discharged or should patient be discharged to assisted, long term care, or home with daughter of patient. I asked if they would like to schedule an appointment to be seen. Daughter stated patient can not ambulate and would need a transport for her mom to get to [facility]. Daughter was also concerned about trying to transport patient along. Please advise.

## 2018-01-27 NOTE — Telephone Encounter (Signed)
If she feels like she is unsafe to be left alone I would recommend daughter to stay with her and look into an assisted living. She will need to discuss knee surgery with orthopedics. It is recommended to follow up in office with [place] once is she discharged from facility.

## 2018-01-27 NOTE — Telephone Encounter (Signed)
Called patient's daughter Sharee Pimple back to discuss Jessica's recommendations. Sharee Pimple stated she will follow up with orthopedic surgeon and see if she is able to go through with scheduled surgery on patient's knee. Stated they had lost second appeal at facility. Patient will be discharged and they are looking into short term assisted living. Stated for now they will have someone staying with patient at their home during the day. She said they will follow up with orthopedic surgeon first before scheduling appointment with [facility].

## 2018-01-30 ENCOUNTER — Other Ambulatory Visit: Payer: Self-pay | Admitting: *Deleted

## 2018-01-30 NOTE — Patient Outreach (Signed)
[facility]) Care Management  01/30/2018  CAROLANN BRAZELL May 09, 1940 [id number]    RN spoke with daughter [name] and introduced the [facility] program and services. RN inquired on the pt;s discharged from the SNF and informed the pt would not go to her home. States she pt's needs 24 hrs services and will be placed into a long term facility for ongoing care. States she had a meeting yesterday with the team and [place] concerning this decision. RN explained based upon that decision the pt would not need community for case management services and did not meet the criteria for future services based upon her long term placement. Daughter grateful and thankful for the follow up today as the call ended.   RN will update the social work liaison on this referral and notify the provider of pt's disposition with [facility] services. No further engagement needed for this pt. Case will be closed.  Raina Mina, RN Care Management Coordinator [place] Office [phone]

## 2018-02-04 ENCOUNTER — Encounter (INDEPENDENT_AMBULATORY_CARE_PROVIDER_SITE_OTHER): Payer: Self-pay | Admitting: Orthopaedic Surgery

## 2018-02-04 ENCOUNTER — Ambulatory Visit (INDEPENDENT_AMBULATORY_CARE_PROVIDER_SITE_OTHER): Payer: PPO | Admitting: Orthopaedic Surgery

## 2018-02-04 VITALS — Ht 60.0 in | Wt 188.7 lb

## 2018-02-04 DIAGNOSIS — L03116 Cellulitis of left lower limb: Secondary | ICD-10-CM | POA: Diagnosis not present

## 2018-02-04 DIAGNOSIS — M1712 Unilateral primary osteoarthritis, left knee: Secondary | ICD-10-CM

## 2018-02-04 NOTE — Progress Notes (Signed)
Office Visit Note   Patient: Susan [name]           Date of Birth: [birth year]           MRN: [id number] Visit Date: 02/04/2018              Requested by: Lauree Chandler, NP [place], [place] [address] PCP: Lauree Chandler, NP   Assessment & Plan: Visit Diagnoses:  1. Unilateral primary osteoarthritis, left knee   2. Cellulitis of left lower extremity     Plan: Impression is end-stage left knee degenerative joint disease with recent development of severe left lower extremity pitting edema.  Unfortunately given the recent events she is not a candidate for total knee replacement at this time.  The severe swelling would have to fully resolve and we would have to make sure that the cellulitis is completely treated before we can proceed with a total knee replacement.  I recommend that she see her PCP for follow-up for this issue so that this can be completely treated.  She will call us back once her swelling is completely resolved.  The patient and her daughter both in agreement.  Follow-Up Instructions: Return if symptoms worsen or fail to improve.   Orders:  No orders of the defined types were placed in this encounter.  No orders of the defined types were placed in this encounter.     Procedures: No procedures performed   Clinical Data: No additional findings.   Subjective: Chief Complaint  Patient presents with  . Left Knee - Follow-up, Pain    Possible TKA left (Surgery)    [name] follows up today for her left knee degenerative joint disease.  Since we last saw her in December she fell and developed cellulitis and was admitted to the hospital for 4 days.  She is currently living at [facility].  She has trouble and difficulty ambulating due to the left leg pain.  She just finished antibiotics yesterday.  She has not seen her PCP in follow-up.  She denies any constitutional symptoms.   Review of Systems  Constitutional: Negative.   HENT:  Negative.   Eyes: Negative.   Respiratory: Negative.   Cardiovascular: Negative.   Endocrine: Negative.   Musculoskeletal: Negative.   Neurological: Negative.   Hematological: Negative.   Psychiatric/Behavioral: Negative.   All other systems reviewed and are negative.    Objective: Vital Signs: Ht 5' (1.524 m)   Wt 188 lb 11.4 oz (85.6 kg)   BMI 36.85 kg/m   Physical Exam Vitals signs and nursing note reviewed.  Constitutional:      Appearance: She is well-developed.  Pulmonary:     Effort: Pulmonary effort is normal.  Skin:    General: Skin is warm.     Capillary Refill: Capillary refill takes less than 2 seconds.  Neurological:     Mental Status: She is alert and oriented to person, place, and time.  Psychiatric:        Behavior: Behavior normal.        Thought Content: Thought content normal.        Judgment: Judgment normal.     Ortho Exam Left lower extremity shows severe pitting edema of the left lower extremity.  No neurovascular compromise. Specialty Comments:  No specialty comments available.  Imaging: No results found.   PMFS History: Patient Active Problem List   Diagnosis Date Noted  . Cellulitis and abscess of leg 12/29/2017  .  CKD (chronic kidney disease) stage 3, GFR 30-59 ml/min (HCC) 12/29/2017  . Cellulitis of left lower extremity   . Ankle edema   . Sepsis ([place]) 12/21/2017  . Medication management 10/11/2016  . Hyperlipemia 06/18/2016  . Gait abnormality 06/18/2016  . Major depressive disorder, recurrent episode, severe ([place]) 11/21/2015  . Edema 02/09/2015  . History of CVA (cerebrovascular accident) 02/01/2015  . 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 ([place])   . 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 Chronic low back pain 01/28/2015  . Short-term memory loss 01/28/2015  . Volume depletion 01/28/2015  . Persistent atrial fibrillation 01/26/2015  . DCM (dilated cardiomyopathy) ([place]) 01/26/2015  .  TIA (transient ischemic attack) 01/24/2015  . Chronic diastolic (congestive) heart failure ([place])   . Obesity (BMI 30-39.9) 01/27/2013  . Constipation 09/02/2012  . H/O arthroscopy of right knee 08/06/2012  . Anxiety   . Depression   . GERD (gastroesophageal reflux disease)   . Hypertensive heart disease   . OSA (obstructive sleep apnea)   . Osteoarthritis of right knee   . Hyperparathyroidism, primary ([place]) 04/22/2012  . Chronic cough 10/02/2011   Past Medical History:  Diagnosis Date  . Anxiety   . Arthritis    "legs, back" (07/29/2012)  . Cellulitis and abscess of leg 12/29/2017  . Chronic diastolic (congestive) heart failure ([place])   . Chronic lower back pain   . Complication of anesthesia    "I have apnea" (07/29/2012)  . 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 Family history of anesthesia complication    "some wake up during OR; some are hard to wake up; some both" (07/29/2012)  . GERD (gastroesophageal reflux disease)   . 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 ([place])   . History of CVA (cerebrovascular accident) 02/01/2015  . History of stomach ulcers 1980's  . Hyperlipidemia   . Hyperparathyroidism, primary ([place]) 04/22/2012  . Hypertensive heart disease   . Incontinent of urine    wears pads  . Major depressive disorder, recurrent episode, severe ([place]) 11/21/2015  . OSA on CPAP   . Osteoarthritis of right knee   . Pedal edema   . Persistent atrial fibrillation    CHADS2VASC score is 7 - on chronic anticoagulation with Apixaban  . Spinal stenosis   . Umbilical hernia    "unrepaired" (07/29/2012)  . Varicose veins    "BLE" (07/29/2012)  . Vascular dementia ([place])     Family History  Problem Relation Age of Onset  . Hypertension Mother        deceased  . Stroke Mother   . Breast cancer Mother   . Lung cancer Father        deceased  . Diabetes Daughter   . Hypertension Daughter     Past Surgical History:  Procedure Laterality  Date  . CATARACT EXTRACTION    . CHOLECYSTECTOMY  ~ 2010  . COLONOSCOPY    . IUD REMOVAL  1980's  . PARATHYROIDECTOMY N/A 06/16/2012   Procedure: NECK EXPLORATION AND LEFT SUPERIOR PARATHYROIDECTOMY;  Surgeon: Earnstine Regal, MD;  Location: [facility];  Service: General;  Laterality: N/A;  . 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 TOTAL KNEE ARTHROPLASTY Right 07/29/2012   Procedure: TOTAL KNEE ARTHROPLASTY;  Surgeon: Ninetta Lights, MD;  Location: [facility];  Service: Orthopedics;  Laterality: Right;   Social History   Occupational History  . Occupation: retired    Fish farm manager: RETIRED  Tobacco Use  . Smoking status: Former Smoker    Packs/day: 1.00    Years: 30.00    Pack years: 30.00    Types: Cigarettes    Last attempt to quit: 01/07/1978    Years since quitting: 40.1  . Smokeless tobacco: Never Used  Substance and Sexual Activity  . Alcohol use: No  . Drug use: No  . Sexual activity: Not Currently    Comment: intercourse age 1,less than 5 secxual partners,

## 2018-02-05 ENCOUNTER — Telehealth: Payer: Self-pay

## 2018-02-05 NOTE — Progress Notes (Signed)
Can we call her family and see if this pt is living at [facility] or has she been discharged? If She is currently there and has a follow up in office on Friday however if she is a resident of [facility] the providers at the facility will be her PCP

## 2018-02-05 NOTE — Telephone Encounter (Signed)
I called patient's daughter Geraldo Pitter. Patient was discharged from [facility] and is now at [facility].  Patient needs to follow-up tomorrow with Lauree Chandler, NP to aggressively get the ball rolling in how to proceed with her medical care/concerns. [name] will not be present at [place] appointment yet plans to be on speaker phone to overhear visit and give input when necessary

## 2018-02-05 NOTE — Telephone Encounter (Signed)
-----   Message from Lauree Chandler, NP sent at 02/05/2018  9:30 AM EST -----   ----- Message ----- From: Leandrew Koyanagi, MD Sent: 02/04/2018   2:32 PM EST To: Lauree Chandler, NP

## 2018-02-06 ENCOUNTER — Encounter: Payer: Self-pay | Admitting: Family

## 2018-02-06 ENCOUNTER — Ambulatory Visit (INDEPENDENT_AMBULATORY_CARE_PROVIDER_SITE_OTHER): Payer: PPO | Admitting: Family

## 2018-02-06 VITALS — BP 140/80 | HR 79 | Temp 97.9°F | Ht 62.0 in

## 2018-02-06 DIAGNOSIS — K2100 Gastro-esophageal reflux disease with esophagitis, without bleeding: Secondary | ICD-10-CM

## 2018-02-06 DIAGNOSIS — I110 Hypertensive heart disease with heart failure: Secondary | ICD-10-CM | POA: Diagnosis not present

## 2018-02-06 DIAGNOSIS — R2681 Unsteadiness on feet: Secondary | ICD-10-CM

## 2018-02-06 DIAGNOSIS — G8929 Other chronic pain: Secondary | ICD-10-CM

## 2018-02-06 DIAGNOSIS — R600 Localized edema: Secondary | ICD-10-CM | POA: Diagnosis not present

## 2018-02-06 DIAGNOSIS — I4819 Other persistent atrial fibrillation: Secondary | ICD-10-CM | POA: Diagnosis not present

## 2018-02-06 DIAGNOSIS — F418 Other specified anxiety disorders: Secondary | ICD-10-CM | POA: Diagnosis not present

## 2018-02-06 DIAGNOSIS — N183 Chronic kidney disease, stage 3 unspecified: Secondary | ICD-10-CM

## 2018-02-06 DIAGNOSIS — M25561 Pain in right knee: Secondary | ICD-10-CM | POA: Diagnosis not present

## 2018-02-06 DIAGNOSIS — I5032 Chronic diastolic (congestive) heart failure: Secondary | ICD-10-CM

## 2018-02-06 LAB — COMPLETE METABOLIC PANEL WITH GFR
AG Ratio: 1.3 (calc) (ref 1.0–2.5)
ALT: 15 U/L (ref 6–29)
AST: 27 U/L (ref 10–35)
Albumin: 3.8 g/dL (ref 3.6–5.1)
Alkaline phosphatase (APISO): 73 U/L (ref 33–130)
BUN/Creatinine Ratio: 15 (calc) (ref 6–22)
BUN: 21 mg/dL (ref 7–25)
CO2: 27 mmol/L (ref 20–32)
Calcium: 10.7 mg/dL — ABNORMAL HIGH (ref 8.6–10.4)
Chloride: 106 mmol/L (ref 98–110)
Creat: 1.43 mg/dL — ABNORMAL HIGH (ref 0.60–0.88)
GFR, EST NON AFRICAN AMERICAN: 35 mL/min/{1.73_m2} — AB (ref >=60)
GFR, Est African American: 40 mL/min/{1.73_m2} — ABNORMAL LOW (ref >=60)
Globulin: 3 g/dL (calc) (ref 1.9–3.7)
Glucose, Bld: 123 mg/dL (ref 65–139)
POTASSIUM: 4.2 mmol/L (ref 3.5–5.3)
Sodium: 141 mmol/L (ref 135–146)
TOTAL PROTEIN: 6.8 g/dL (ref 6.1–8.1)
Total Bilirubin: 0.4 mg/dL (ref 0.2–1.2)

## 2018-02-06 LAB — TSH: TSH: 1.61 mIU/L (ref 0.40–4.50)

## 2018-02-06 NOTE — Patient Instructions (Addendum)
  1. Fall and safety precautions. 2. Labs drawn will call with results.

## 2018-02-07 DIAGNOSIS — G4733 Obstructive sleep apnea (adult) (pediatric): Secondary | ICD-10-CM | POA: Diagnosis not present

## 2018-02-07 DIAGNOSIS — F0150 Vascular dementia without behavioral disturbance: Secondary | ICD-10-CM | POA: Diagnosis not present

## 2018-02-07 DIAGNOSIS — L03116 Cellulitis of left lower limb: Secondary | ICD-10-CM | POA: Diagnosis not present

## 2018-02-07 DIAGNOSIS — I130 Hypertensive heart and chronic kidney disease with heart failure and stage 1 through stage 4 chronic kidney disease, or unspecified chronic kidney disease: Secondary | ICD-10-CM | POA: Diagnosis not present

## 2018-02-07 DIAGNOSIS — D631 Anemia in chronic kidney disease: Secondary | ICD-10-CM

## 2018-02-07 DIAGNOSIS — I69351 Hemiplegia and hemiparesis following cerebral infarction affecting right dominant side: Secondary | ICD-10-CM | POA: Diagnosis not present

## 2018-02-07 DIAGNOSIS — M170 Bilateral primary osteoarthritis of knee: Secondary | ICD-10-CM | POA: Diagnosis not present

## 2018-02-07 DIAGNOSIS — I5032 Chronic diastolic (congestive) heart failure: Secondary | ICD-10-CM

## 2018-02-07 DIAGNOSIS — I4819 Other persistent atrial fibrillation: Secondary | ICD-10-CM | POA: Diagnosis not present

## 2018-02-07 DIAGNOSIS — I69322 Dysarthria following cerebral infarction: Secondary | ICD-10-CM | POA: Diagnosis not present

## 2018-02-07 DIAGNOSIS — Z9181 History of falling: Secondary | ICD-10-CM | POA: Diagnosis not present

## 2018-02-07 DIAGNOSIS — I69318 Other symptoms and signs involving cognitive functions following cerebral infarction: Secondary | ICD-10-CM

## 2018-02-07 DIAGNOSIS — F332 Major depressive disorder, recurrent severe without psychotic features: Secondary | ICD-10-CM | POA: Diagnosis not present

## 2018-02-07 DIAGNOSIS — Z7901 Long term (current) use of anticoagulants: Secondary | ICD-10-CM | POA: Diagnosis not present

## 2018-02-07 DIAGNOSIS — N183 Chronic kidney disease, stage 3 (moderate): Secondary | ICD-10-CM

## 2018-02-08 NOTE — Progress Notes (Signed)
Provider: Chiquitta Matty FNP-C   Lauree Chandler, NP  Patient Care Team: Lauree Chandler, NP as PCP - General (Geriatric Medicine) Sueanne Margarita, MD as PCP - Cardiology (Cardiology)  Extended Emergency Contact Information Primary Emergency Contact: [place], [place] [address] [place] Phone: [phone] Work Phone: [phone] Mobile Phone: [phone] Relation: Daughter Secondary Emergency Contact: Brandilyn, [place] Mobile Phone: [phone] Relation: Daughter  Goals of care: Advanced Directive information Advanced Directives 02/06/2018  Does Patient Have a Medical Advance Directive? No  Type of Advance Directive -  Does patient want to make changes to medical advance directive? -  Copy of [place] in Chart? -  Would patient like information on creating a medical advance directive? -  Pre-existing out of facility DNR order (yellow form or pink MOST form) -     Chief Complaint  Patient presents with  . Follow-up    Follow up Rehab - discharged from [facility], Patient has been moved to [place] but is still awaiting PT orders  . other    Patient c/o of back, shlouder pain, and dizziness.     HPI:  Pt is a 78 y.o. female seen today for follow up post discharge from [facility].she is here escorted by her daughter.she states was in the [facility] 12/29/2017-01/03/2018 for left leg cellulitis.she was treated with IV vancomycin and Zosyn then discharged on oral doxycycline and cipro.she has worked with Physical therapy at [facility].per daughter patient not able to walk by herself and ALF was recommended.Patient was discharged from Rehab 02/03/2018 now residing at [facility] in [place].she states was suppose to have an order for therapy but has not been received. Left leg still swollen but wound has healed.swelling gets better with elevation.Her medication were refilled on discharge from rehab.       Chronic congestive heart failure - no worsening shortness of breath or cough.on Furosemide 20 mg tablet daily and losartan 50 mg tablet daily.Reports no abrupt weight gain.    Chronic kidney disease -no decrease in urine output.    Hypertension - on Furosemide 20 mg tablet daily and losartan 50 mg tablet daily.Reports dizziness sometimes.   Afib - on apixaban 5 mg tablet twice daily.No palpitation reported.    GERD- symptoms stable on Protonix 40 mg tablet daily.    Depression /Anxiety - stable on sertraline 12.5 mg tablet twice daily and Bupropion 450 mg tablet daily.   Right knee pain - on Tramadol 50 mg tablet every 6 hours as needed.seen by Orthopedic 02/04/2018 plan for knee replacement but currently on hold until left leg swelling resolves.    Past Medical History:  Diagnosis Date  . Anxiety   . Arthritis    "legs, back" (07/29/2012)  . Cellulitis and abscess of leg 12/29/2017  . Chronic diastolic (congestive) heart failure ([place])   . Chronic lower back pain   . Complication of anesthesia    "I have apnea" (07/29/2012)  . 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 Family history of anesthesia complication    "some wake up during OR; some are hard to wake up; some both" (07/29/2012)  . GERD (gastroesophageal reflux disease)   . 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 ([place])   . History of CVA (cerebrovascular accident) 02/01/2015  . History of stomach ulcers 1980's  . Hyperlipidemia   . Hyperparathyroidism, primary ([place]) 04/22/2012  . Hypertensive heart disease   .  Incontinent of urine    wears pads  . Major depressive disorder, recurrent episode, severe ([place]) 11/21/2015  . OSA on CPAP   . Osteoarthritis of right knee   . Pedal edema   . Persistent atrial fibrillation    CHADS2VASC score is 7 - on chronic anticoagulation with Apixaban  . Spinal stenosis   . Umbilical hernia    "unrepaired" (07/29/2012)  . Varicose veins    "BLE" (07/29/2012)   . Vascular dementia [facility])    Past Surgical History:  Procedure Laterality Date  . CATARACT EXTRACTION    . CHOLECYSTECTOMY  ~ 2010  . COLONOSCOPY    . IUD REMOVAL  1980's  . PARATHYROIDECTOMY N/A 06/16/2012   Procedure: NECK EXPLORATION AND LEFT SUPERIOR PARATHYROIDECTOMY;  Surgeon: Earnstine Regal, MD;  Location: [facility];  Service: General;  Laterality: N/A;  . 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 TOTAL KNEE ARTHROPLASTY Right 07/29/2012   Procedure: TOTAL KNEE ARTHROPLASTY;  Surgeon: Ninetta Lights, MD;  Location: [facility];  Service: Orthopedics;  Laterality: Right;    Allergies  Allergen Reactions  . Ace Inhibitors Cough  . Lipitor [Atorvastatin] Swelling  . Simvastatin Other (See Comments)    Myalgias     . Latex Itching    Allergies as of 02/06/2018      Reactions   Ace Inhibitors Cough   Lipitor [atorvastatin] Swelling   Simvastatin Other (See Comments)   Myalgias    Latex Itching      Medication List       Accurate as of February 06, 2018 11:59 PM. Always use your most recent med list.        apixaban 5 MG Tabs tablet Commonly known as:  ELIQUIS Take 1 tablet (5 mg total) by mouth every 12 (twelve) hours.   buPROPion 150 MG 24 hr tablet Commonly known as:  WELLBUTRIN XL Take 450 mg by mouth daily.   Calcium Carbonate-Vitamin D 600-400 MG-UNIT tablet Take 1 tablet by mouth 2 (two) times daily.   carvedilol 12.5 MG tablet Commonly known as:  COREG Take 12.5 mg by mouth 2 (two) times daily with a meal.   cholecalciferol 25 MCG (1000 UT) tablet Commonly known as:  VITAMIN D Take 1,000 Units by mouth daily.   cyclobenzaprine 5 MG tablet Commonly known as:  FLEXERIL Take 5 mg by mouth 3 (three) times daily as needed for muscle spasms.   diclofenac sodium 1 % Gel Commonly known as:  VOLTAREN Apply 2 g topically 4 (four) times daily.   docusate sodium 100 MG capsule Commonly known as:  COLACE Take 100 mg by mouth daily.   donepezil 10 MG  tablet Commonly known as:  ARICEPT Take 1 tablet (10 mg total) by mouth at bedtime. For memory loss   furosemide 20 MG tablet Commonly known as:  LASIX Take 20 mg by mouth daily.   gabapentin 300 MG capsule Commonly known as:  NEURONTIN Take 300 mg by mouth 2 (two) times daily.   IFEREX 150 150 MG capsule Generic drug:  iron polysaccharides Take 150 mg by mouth daily.   losartan 50 MG tablet Commonly known as:  COZAAR Take 1 tablet (50 mg total) by mouth daily.   Menthol (Topical Analgesic) 4 % Gel Commonly known as:  BIOFREEZE Apply 3 oz topically 3 (three) times daily. For knee pain   multivitamin with minerals Tabs tablet Take 1 tablet by mouth daily.   pantoprazole 40 MG tablet Commonly known as:  PROTONIX Take 40 mg by mouth daily.   saccharomyces boulardii 250 MG capsule Commonly known as:  FLORASTOR Take 1 capsule (250 mg total) by mouth 2 (two) times daily.   sertraline 25 MG tablet Commonly known as:  ZOLOFT Take 0.5 tablets (12.5 mg total) by mouth 2 (two) times daily.   traMADol 50 MG tablet Commonly known as:  ULTRAM Take 1 tablet (50 mg total) by mouth every 6 (six) hours as needed.   vitamin B-12 1000 MCG tablet Commonly known as:  CYANOCOBALAMIN Take 2,000 mcg by mouth daily.       Review of Systems  Reason unable to perform ROS: additional information provided by patient's daughter   Constitutional: Negative for appetite change, chills, fatigue, fever and unexpected weight change.  HENT: Negative for congestion, rhinorrhea, sinus pressure, sinus pain, sneezing and sore throat.   Eyes: Negative for pain, discharge, redness and itching.  Respiratory: Negative for cough, chest tightness, shortness of breath and wheezing.   Cardiovascular: Positive for leg swelling. Negative for chest pain and palpitations.  Gastrointestinal: Negative for abdominal distention, abdominal pain, constipation, diarrhea, nausea and vomiting.  Endocrine: Negative for  cold intolerance, heat intolerance, polydipsia, polyphagia and polyuria.  Genitourinary: Negative for dysuria, flank pain, frequency and urgency.  Musculoskeletal: Positive for back pain and gait problem.  Skin: Negative for color change, pallor, rash and wound.  Neurological: Negative for dizziness, weakness, light-headedness, numbness and headaches.  Psychiatric/Behavioral: Negative for agitation, confusion and sleep disturbance. The patient is nervous/anxious.     Immunization History  Administered Date(s) Administered  . Influenza Whole 10/02/2010  . Influenza, High Dose Seasonal PF 10/25/2016, 10/12/2017  . PPD Test 01/31/2015  . Pneumococcal Conjugate-13 11/17/2017  . Pneumococcal-Unspecified 01/08/2012  . Tdap 09/14/2017   Pertinent  Health Maintenance Due  Topic Date Due  . INFLUENZA VACCINE  Completed  . DEXA SCAN  Completed  . PNA vac Low Risk Adult  Completed   Fall Risk  02/06/2018 12/17/2017 11/17/2017 10/21/2017 09/25/2017  Falls in the past year? 0 1 1 Yes Yes  Number falls in past yr: _0 or more 2 or more  Injury with Fall? 1 0 0 No No  Comment - - - - -  Risk for fall due to : - Other (Comment) - - -  Risk for fall due to: Comment - Pt fell while trying to get on a high bed; pt fell out of chair while sleeping - - -  Follow up - - - - -  Comment - - - - -    Vitals:   02/06/18 1320  BP: 140/80  Pulse: 79  Temp: 97.9 F (36.6 C)  TempSrc: Oral  SpO2: 94%  Height: _1  (1.575 m)   Body mass index is 34.52 kg/m. Physical Exam Constitutional:      General: She is not in acute distress.    Appearance: She is obese.  HENT:     Head: Normocephalic.     Right Ear: Tympanic membrane, ear canal and external ear normal. There is no impacted cerumen.     Left Ear: Tympanic membrane, ear canal and external ear normal. There is no impacted cerumen.     Nose: Nose normal. No congestion or rhinorrhea.     Mouth/Throat:     Mouth: Mucous membranes are  moist.     Pharynx: Oropharynx is clear. No oropharyngeal exudate or posterior oropharyngeal erythema.  Eyes:     General: No scleral icterus.  Right eye: No discharge.        Left eye: No discharge.     Conjunctiva/sclera: Conjunctivae normal.     Pupils: Pupils are equal, round, and reactive to light.  Neck:     Musculoskeletal: Normal range of motion. No muscular tenderness.  Cardiovascular:     Rate and Rhythm: Normal rate. Rhythm irregular.     Pulses: Normal pulses.     Heart sounds: Murmur present. No friction rub. No gallop.   Pulmonary:     Effort: Pulmonary effort is normal. No respiratory distress.     Breath sounds: Normal breath sounds. No wheezing, rhonchi or rales.  Chest:     Chest wall: No tenderness.  Abdominal:     General: Bowel sounds are normal. There is no distension.     Palpations: Abdomen is soft. There is no mass.     Tenderness: There is no abdominal tenderness. There is no right CVA tenderness, left CVA tenderness, guarding or rebound.  Musculoskeletal:     Left lower leg: She exhibits swelling. She exhibits no tenderness, no deformity and no laceration.     Comments: Unsteady gait.left leg non-pitting edema.  Lymphadenopathy:     Cervical: No cervical adenopathy.  Skin:    General: Skin is warm and dry.     Coloration: Skin is not pale.     Findings: No erythema or rash.  Neurological:     Mental Status: She is alert and oriented to person, place, and time.     Cranial Nerves: No cranial nerve deficit.     Sensory: No sensory deficit.     Motor: No weakness.     Coordination: Coordination normal.     Gait: Gait abnormal.  Psychiatric:        Mood and Affect: Mood normal.        Speech: Speech normal.        Behavior: Behavior normal.        Thought Content: Thought content normal.        Cognition and Memory: She exhibits impaired recent memory.        Judgment: Judgment normal.     Labs reviewed: Recent Labs    12/30/17 0322  12/31/17 0348 01/03/18 0338 02/06/18 1429  NA 135 140  --  141  K 3.7 4.0  --  4.2  CL 100 102  --  106  CO2 24 26  --  27  GLUCOSE 100* 89  --  123  BUN 14 14  --  21  CREATININE 1.30* 1.21* 1.17* 1.43*  CALCIUM 9.5 9.6  --  10.7*   Recent Labs    12/21/17 1527 12/29/17 1025 02/06/18 1429  AST 48* 38 27  ALT 29 33 15  ALKPHOS 50 76  --   BILITOT 0.6 1.2 0.4  PROT 6.9 6.0* 6.8  ALBUMIN 3.6 2.2*  --    Recent Labs    12/17/17 1007 12/21/17 1527  12/29/17 1025 12/30/17 0322 12/31/17 0348 01/01/18 0900  WBC 2.9* 19.5*   < > 9.5 9.5 9.7 9.3  NEUTROABS 1,601 17.9*  --  6.8  --   --   --   HGB 9.5* 10.1*   < > 8.6* 8.0* 7.6* 8.0*  HCT 30.0* 31.8*   < > 27.3* 24.8* 23.9* 24.5*  MCV 93.5 94.9   < > 94.5 91.2 92.3 91.4  PLT 235 208   < > 380 369 412* 418*   < > = values  in this interval not displayed.   Lab Results  Component Value Date   TSH 1.61 02/06/2018   Lab Results  Component Value Date   HGBA1C 5.5 12/21/2017   Lab Results  Component Value Date   CHOL 199 09/05/2017   HDL 65 09/05/2017   LDLCALC 117 (H) 09/05/2017   TRIG 73 09/05/2017   CHOLHDL 3.1 09/05/2017    Significant Diagnostic Results in last 30 days:  No results found.  Assessment/Plan 1. Unsteady gait Status post discharge from [facility] no notes for review.Daughter concerned that Therapy orders were not send to [facility] for patient to continue with PT but later during visit another patient's daughter called and stated that orders has been send.continue to work with Therapy for gait stability,exercise and muscle strengthening.Fall and safety precautions advised.   2. Leg edema, left None pitting edema.No calf tenderness on palpation.Elevate leg whenever seated.continue to work with Therapy.Knee high ted  Hose on in the morning and off at bedtime.    3. Chronic diastolic (congestive) heart failure (HCC) No abrupt weight gain,shortness of breath,cough or wheezing.continue  on Furosemide 20 mg tablet daily and losartan 50 mg tablet daily.  4. CKD (chronic kidney disease) stage 3, GFR 30-59 ml/min (HCC) Previous CR at baseline.Check CMP   5. Hypertensive heart disease with congestive heart failure, unspecified heart failure type (HCC) B/p stable.continue on Furosemide 20 mg tablet daily and losartan 50 mg tablet daily - CBC with Differential/Platelet; Future - CMP with eGFR([facility]) - TSH  6. Persistent atrial fibrillation HR irregular.no palpitation.Continue on apixaban 5 mg tablet twice daily   7. Gastroesophageal reflux disease with esophagitis Asymptomatic.continue on Protonix 40 mg tablet daily.  8.Depression/Anxiety   mood stable.continue stable on sertraline 12.5 mg tablet twice daily and Bupropion 450 mg tablet daily.monitor for mood changes.  - TSH   9.Right knee pain  Awaiting knee replacement once left leg swelling resolve.continue on Tramadol 50 mg tablet every 6 hours as needed.Follow up with Orthopedic as directed.  Family/ staff Communication: Reviewed plan of care with patient and daughter.  Labs/tests ordered:  - CBC with Differential/Platelet; Future - CMP with eGFR([facility]) - TSH  Johnross Nabozny C Sahej Hauswirth, NP

## 2018-02-10 ENCOUNTER — Other Ambulatory Visit: Payer: Self-pay | Admitting: *Deleted

## 2018-02-10 DIAGNOSIS — N183 Chronic kidney disease, stage 3 unspecified: Secondary | ICD-10-CM

## 2018-02-12 ENCOUNTER — Emergency Department (HOSPITAL_COMMUNITY): Payer: PPO

## 2018-02-12 ENCOUNTER — Observation Stay (HOSPITAL_COMMUNITY): Payer: PPO

## 2018-02-12 ENCOUNTER — Inpatient Hospital Stay (HOSPITAL_COMMUNITY)
Admission: EM | Admit: 2018-02-12 | Discharge: 2018-02-20 | DRG: 603 | Disposition: A | Discharge status: Home / Self Care | Payer: PPO | Source: Skilled Nursing Facility | Attending: Internal Medicine | Admitting: Internal Medicine

## 2018-02-12 ENCOUNTER — Encounter (HOSPITAL_COMMUNITY): Payer: Self-pay

## 2018-02-12 ENCOUNTER — Other Ambulatory Visit: Payer: Self-pay

## 2018-02-12 DIAGNOSIS — I69319 Unspecified symptoms and signs involving cognitive functions following cerebral infarction: Secondary | ICD-10-CM

## 2018-02-12 DIAGNOSIS — I69351 Hemiplegia and hemiparesis following cerebral infarction affecting right dominant side: Secondary | ICD-10-CM

## 2018-02-12 DIAGNOSIS — I420 Dilated cardiomyopathy: Secondary | ICD-10-CM | POA: Diagnosis present

## 2018-02-12 DIAGNOSIS — T504X5A Adverse effect of drugs affecting uric acid metabolism, initial encounter: Secondary | ICD-10-CM | POA: Diagnosis not present

## 2018-02-12 DIAGNOSIS — Z7901 Long term (current) use of anticoagulants: Secondary | ICD-10-CM

## 2018-02-12 DIAGNOSIS — M1712 Unilateral primary osteoarthritis, left knee: Secondary | ICD-10-CM | POA: Diagnosis present

## 2018-02-12 DIAGNOSIS — M109 Gout, unspecified: Secondary | ICD-10-CM | POA: Diagnosis present

## 2018-02-12 DIAGNOSIS — R52 Pain, unspecified: Secondary | ICD-10-CM | POA: Diagnosis not present

## 2018-02-12 DIAGNOSIS — Z888 Allergy status to other drugs, medicaments and biological substances status: Secondary | ICD-10-CM

## 2018-02-12 DIAGNOSIS — L03116 Cellulitis of left lower limb: Secondary | ICD-10-CM

## 2018-02-12 DIAGNOSIS — K219 Gastro-esophageal reflux disease without esophagitis: Secondary | ICD-10-CM | POA: Diagnosis present

## 2018-02-12 DIAGNOSIS — Z96651 Presence of right artificial knee joint: Secondary | ICD-10-CM | POA: Diagnosis present

## 2018-02-12 DIAGNOSIS — G4733 Obstructive sleep apnea (adult) (pediatric): Secondary | ICD-10-CM | POA: Diagnosis present

## 2018-02-12 DIAGNOSIS — M2550 Pain in unspecified joint: Secondary | ICD-10-CM | POA: Diagnosis not present

## 2018-02-12 DIAGNOSIS — E210 Primary hyperparathyroidism: Secondary | ICD-10-CM | POA: Diagnosis present

## 2018-02-12 DIAGNOSIS — S3992XA Unspecified injury of lower back, initial encounter: Secondary | ICD-10-CM | POA: Diagnosis not present

## 2018-02-12 DIAGNOSIS — Z9989 Dependence on other enabling machines and devices: Secondary | ICD-10-CM | POA: Diagnosis not present

## 2018-02-12 DIAGNOSIS — M79605 Pain in left leg: Secondary | ICD-10-CM | POA: Diagnosis not present

## 2018-02-12 DIAGNOSIS — M48062 Spinal stenosis, lumbar region with neurogenic claudication: Secondary | ICD-10-CM | POA: Diagnosis not present

## 2018-02-12 DIAGNOSIS — Z91040 Latex allergy status: Secondary | ICD-10-CM

## 2018-02-12 DIAGNOSIS — S199XXA Unspecified injury of neck, initial encounter: Secondary | ICD-10-CM | POA: Diagnosis not present

## 2018-02-12 DIAGNOSIS — I10 Essential (primary) hypertension: Secondary | ICD-10-CM | POA: Diagnosis not present

## 2018-02-12 DIAGNOSIS — L0390 Cellulitis, unspecified: Secondary | ICD-10-CM | POA: Diagnosis not present

## 2018-02-12 DIAGNOSIS — G8929 Other chronic pain: Secondary | ICD-10-CM | POA: Diagnosis present

## 2018-02-12 DIAGNOSIS — N183 Chronic kidney disease, stage 3 (moderate): Secondary | ICD-10-CM | POA: Diagnosis present

## 2018-02-12 DIAGNOSIS — M545 Low back pain: Secondary | ICD-10-CM | POA: Diagnosis not present

## 2018-02-12 DIAGNOSIS — I130 Hypertensive heart and chronic kidney disease with heart failure and stage 1 through stage 4 chronic kidney disease, or unspecified chronic kidney disease: Secondary | ICD-10-CM | POA: Diagnosis present

## 2018-02-12 DIAGNOSIS — Z8673 Personal history of transient ischemic attack (TIA), and cerebral infarction without residual deficits: Secondary | ICD-10-CM

## 2018-02-12 DIAGNOSIS — Z79899 Other long term (current) drug therapy: Secondary | ICD-10-CM

## 2018-02-12 DIAGNOSIS — I159 Secondary hypertension, unspecified: Secondary | ICD-10-CM | POA: Diagnosis not present

## 2018-02-12 DIAGNOSIS — Z791 Long term (current) use of non-steroidal anti-inflammatories (NSAID): Secondary | ICD-10-CM

## 2018-02-12 DIAGNOSIS — Z79891 Long term (current) use of opiate analgesic: Secondary | ICD-10-CM

## 2018-02-12 DIAGNOSIS — I5022 Chronic systolic (congestive) heart failure: Secondary | ICD-10-CM | POA: Diagnosis not present

## 2018-02-12 DIAGNOSIS — Z823 Family history of stroke: Secondary | ICD-10-CM

## 2018-02-12 DIAGNOSIS — Z87891 Personal history of nicotine dependence: Secondary | ICD-10-CM

## 2018-02-12 DIAGNOSIS — I480 Paroxysmal atrial fibrillation: Secondary | ICD-10-CM

## 2018-02-12 DIAGNOSIS — R2681 Unsteadiness on feet: Secondary | ICD-10-CM | POA: Diagnosis not present

## 2018-02-12 DIAGNOSIS — W19XXXA Unspecified fall, initial encounter: Secondary | ICD-10-CM | POA: Diagnosis not present

## 2018-02-12 DIAGNOSIS — I69322 Dysarthria following cerebral infarction: Secondary | ICD-10-CM

## 2018-02-12 DIAGNOSIS — G459 Transient cerebral ischemic attack, unspecified: Secondary | ICD-10-CM | POA: Diagnosis not present

## 2018-02-12 DIAGNOSIS — I4819 Other persistent atrial fibrillation: Secondary | ICD-10-CM | POA: Diagnosis not present

## 2018-02-12 DIAGNOSIS — Z7401 Bed confinement status: Secondary | ICD-10-CM | POA: Diagnosis not present

## 2018-02-12 DIAGNOSIS — Z9181 History of falling: Secondary | ICD-10-CM | POA: Diagnosis not present

## 2018-02-12 DIAGNOSIS — F0150 Vascular dementia without behavioral disturbance: Secondary | ICD-10-CM | POA: Diagnosis present

## 2018-02-12 DIAGNOSIS — R404 Transient alteration of awareness: Secondary | ICD-10-CM | POA: Diagnosis not present

## 2018-02-12 DIAGNOSIS — F332 Major depressive disorder, recurrent severe without psychotic features: Secondary | ICD-10-CM | POA: Diagnosis not present

## 2018-02-12 DIAGNOSIS — S0990XA Unspecified injury of head, initial encounter: Secondary | ICD-10-CM | POA: Diagnosis not present

## 2018-02-12 DIAGNOSIS — A419 Sepsis, unspecified organism: Secondary | ICD-10-CM | POA: Diagnosis not present

## 2018-02-12 DIAGNOSIS — M5489 Other dorsalgia: Secondary | ICD-10-CM | POA: Diagnosis not present

## 2018-02-12 DIAGNOSIS — L03119 Cellulitis of unspecified part of limb: Secondary | ICD-10-CM | POA: Diagnosis not present

## 2018-02-12 DIAGNOSIS — M7989 Other specified soft tissue disorders: Secondary | ICD-10-CM | POA: Diagnosis not present

## 2018-02-12 DIAGNOSIS — R2689 Other abnormalities of gait and mobility: Secondary | ICD-10-CM | POA: Diagnosis not present

## 2018-02-12 DIAGNOSIS — D702 Other drug-induced agranulocytosis: Secondary | ICD-10-CM | POA: Diagnosis not present

## 2018-02-12 DIAGNOSIS — Z8249 Family history of ischemic heart disease and other diseases of the circulatory system: Secondary | ICD-10-CM

## 2018-02-12 LAB — CBC WITH DIFFERENTIAL/PLATELET
Abs Immature Granulocytes: 0.02 10*3/uL (ref 0.00–0.07)
Basophils Absolute: 0 10*3/uL (ref 0.0–0.1)
Basophils Relative: 1 %
EOS ABS: 0.1 10*3/uL (ref 0.0–0.5)
Eosinophils Relative: 2 %
HCT: 32.9 % — ABNORMAL LOW (ref 36.0–46.0)
Hemoglobin: 10.1 g/dL — ABNORMAL LOW (ref 12.0–15.0)
Immature Granulocytes: 1 %
Lymphocytes Relative: 19 %
Lymphs Abs: 0.8 10*3/uL (ref 0.7–4.0)
MCH: 29.6 pg (ref 26.0–34.0)
MCHC: 30.7 g/dL (ref 30.0–36.0)
MCV: 96.5 fL (ref 80.0–100.0)
Monocytes Absolute: 0.6 10*3/uL (ref 0.1–1.0)
Monocytes Relative: 14 %
Neutro Abs: 2.6 10*3/uL (ref 1.7–7.7)
Neutrophils Relative %: 63 %
PLATELETS: 245 10*3/uL (ref 150–400)
RBC: 3.41 MIL/uL — ABNORMAL LOW (ref 3.87–5.11)
RDW: 15.6 % — AB (ref 11.5–15.5)
WBC: 4.1 10*3/uL (ref 4.0–10.5)
nRBC: 0 % (ref 0.0–0.2)

## 2018-02-12 LAB — BASIC METABOLIC PANEL
Anion gap: 8 (ref 5–15)
BUN: 21 mg/dL (ref 8–23)
CO2: 27 mmol/L (ref 22–32)
Calcium: 10 mg/dL (ref 8.9–10.3)
Chloride: 104 mmol/L (ref 98–111)
Creatinine, Ser: 1.38 mg/dL — ABNORMAL HIGH (ref 0.44–1.00)
GFR calc Af Amer: 42 mL/min — ABNORMAL LOW (ref >60)
GFR calc non Af Amer: 36 mL/min — ABNORMAL LOW (ref >60)
Glucose, Bld: 99 mg/dL (ref 70–99)
Potassium: 3.8 mmol/L (ref 3.5–5.1)
Sodium: 139 mmol/L (ref 135–145)

## 2018-02-12 LAB — URINALYSIS, ROUTINE W REFLEX MICROSCOPIC
Bilirubin Urine: NEGATIVE
Glucose, UA: NEGATIVE mg/dL
HGB URINE DIPSTICK: NEGATIVE
Ketones, ur: NEGATIVE mg/dL
Leukocytes, UA: NEGATIVE
Nitrite: NEGATIVE
Protein, ur: NEGATIVE mg/dL
Specific Gravity, Urine: 1.009 (ref 1.005–1.030)
pH: 8 (ref 5.0–8.0)

## 2018-02-12 MED ORDER — DONEPEZIL HCL 10 MG PO TABS
10.0000 mg | ORAL_TABLET | Freq: Every day | ORAL | Status: DC
  Administered 2018-02-13 – 2018-02-19 (×7): 10 mg via ORAL
  Filled 2018-02-12 (×7): qty 1

## 2018-02-12 MED ORDER — CALCIUM CARBONATE-VITAMIN D 500-200 MG-UNIT PO TABS
1.0000 | ORAL_TABLET | Freq: Two times a day (BID) | ORAL | Status: DC
  Administered 2018-02-13 – 2018-02-20 (×15): 1 via ORAL
  Filled 2018-02-12 (×15): qty 1

## 2018-02-12 MED ORDER — ADULT MULTIVITAMIN W/MINERALS CH
1.0000 | ORAL_TABLET | Freq: Every day | ORAL | Status: DC
  Administered 2018-02-13 – 2018-02-20 (×8): 1 via ORAL
  Filled 2018-02-12 (×8): qty 1

## 2018-02-12 MED ORDER — LORAZEPAM 2 MG/ML IJ SOLN: 0.5000 mg | INTRAMUSCULAR | Status: DC | PRN

## 2018-02-12 MED ORDER — ACETAMINOPHEN 160 MG/5ML PO SOLN: 650.0000 mg | ORAL | Status: DC | PRN

## 2018-02-12 MED ORDER — ACETAMINOPHEN 650 MG RE SUPP: 650.0000 mg | RECTAL | Status: DC | PRN

## 2018-02-12 MED ORDER — SODIUM CHLORIDE 0.9 % IV SOLN
1.0000 g | INTRAVENOUS | Status: DC
  Administered 2018-02-12 – 2018-02-15 (×4): 1 g via INTRAVENOUS
  Filled 2018-02-12 (×4): qty 1

## 2018-02-12 MED ORDER — SACCHAROMYCES BOULARDII 250 MG PO CAPS
250.0000 mg | ORAL_CAPSULE | Freq: Two times a day (BID) | ORAL | Status: DC
  Administered 2018-02-13 – 2018-02-20 (×15): 250 mg via ORAL
  Filled 2018-02-12 (×15): qty 1

## 2018-02-12 MED ORDER — SERTRALINE HCL 25 MG PO TABS
12.5000 mg | ORAL_TABLET | Freq: Two times a day (BID) | ORAL | Status: DC
  Administered 2018-02-13 – 2018-02-20 (×15): 12.5 mg via ORAL
  Filled 2018-02-12 (×15): qty 1

## 2018-02-12 MED ORDER — VITAMIN B-12 1000 MCG PO TABS
2000.0000 ug | ORAL_TABLET | Freq: Every day | ORAL | Status: DC
  Administered 2018-02-13 – 2018-02-20 (×8): 2000 ug via ORAL
  Filled 2018-02-12 (×8): qty 2

## 2018-02-12 MED ORDER — STROKE: EARLY STAGES OF RECOVERY BOOK
Freq: Once | Status: AC
  Administered 2018-02-13: 11:00:00
  Filled 2018-02-12: qty 1

## 2018-02-12 MED ORDER — GABAPENTIN 300 MG PO CAPS
300.0000 mg | ORAL_CAPSULE | Freq: Two times a day (BID) | ORAL | Status: DC
  Administered 2018-02-13 – 2018-02-20 (×15): 300 mg via ORAL
  Filled 2018-02-12 (×15): qty 1

## 2018-02-12 MED ORDER — VANCOMYCIN HCL 10 G IV SOLR: 1500.0000 mg | INTRAVENOUS | Status: DC

## 2018-02-12 MED ORDER — CARVEDILOL 12.5 MG PO TABS
12.5000 mg | ORAL_TABLET | Freq: Two times a day (BID) | ORAL | Status: DC
  Administered 2018-02-13 – 2018-02-20 (×15): 12.5 mg via ORAL
  Filled 2018-02-12 (×15): qty 1

## 2018-02-12 MED ORDER — LOSARTAN POTASSIUM 50 MG PO TABS
50.0000 mg | ORAL_TABLET | Freq: Every day | ORAL | Status: DC
  Administered 2018-02-13 – 2018-02-20 (×8): 50 mg via ORAL
  Filled 2018-02-12 (×8): qty 1

## 2018-02-12 MED ORDER — DOCUSATE SODIUM 100 MG PO CAPS
100.0000 mg | ORAL_CAPSULE | Freq: Every day | ORAL | Status: DC
  Administered 2018-02-13 – 2018-02-20 (×8): 100 mg via ORAL
  Filled 2018-02-12 (×8): qty 1

## 2018-02-12 MED ORDER — APIXABAN 5 MG PO TABS
5.0000 mg | ORAL_TABLET | Freq: Two times a day (BID) | ORAL | Status: DC
  Administered 2018-02-13 – 2018-02-20 (×15): 5 mg via ORAL
  Filled 2018-02-12 (×15): qty 1

## 2018-02-12 MED ORDER — DICLOFENAC SODIUM 1 % TD GEL
2.0000 g | Freq: Four times a day (QID) | TRANSDERMAL | Status: DC
  Administered 2018-02-12 – 2018-02-19 (×20): 2 g via TOPICAL
  Filled 2018-02-12: qty 100

## 2018-02-12 MED ORDER — ACETAMINOPHEN 325 MG PO TABS
650.0000 mg | ORAL_TABLET | ORAL | Status: DC | PRN
  Administered 2018-02-13: 650 mg via ORAL
  Filled 2018-02-12: qty 2

## 2018-02-12 MED ORDER — MUSCLE RUB 10-15 % EX CREA
1.0000 "application " | TOPICAL_CREAM | Freq: Three times a day (TID) | CUTANEOUS | Status: DC
  Administered 2018-02-12 – 2018-02-20 (×17): 1 via TOPICAL
  Filled 2018-02-12: qty 85

## 2018-02-12 MED ORDER — LORAZEPAM 1 MG PO TABS
1.0000 mg | ORAL_TABLET | Freq: Once | ORAL | Status: AC
  Administered 2018-02-12: 1 mg via ORAL
  Filled 2018-02-12: qty 1

## 2018-02-12 MED ORDER — PANTOPRAZOLE SODIUM 40 MG PO TBEC
40.0000 mg | DELAYED_RELEASE_TABLET | Freq: Every day | ORAL | Status: DC
  Administered 2018-02-13 – 2018-02-20 (×8): 40 mg via ORAL
  Filled 2018-02-12 (×8): qty 1

## 2018-02-12 MED ORDER — CYCLOBENZAPRINE HCL 5 MG PO TABS
5.0000 mg | ORAL_TABLET | Freq: Three times a day (TID) | ORAL | Status: DC | PRN
  Filled 2018-02-12: qty 1

## 2018-02-12 MED ORDER — TRAMADOL HCL 50 MG PO TABS: 50.0000 mg | ORAL_TABLET | Freq: Four times a day (QID) | ORAL | Status: DC | PRN

## 2018-02-12 MED ORDER — FUROSEMIDE 20 MG PO TABS
20.0000 mg | ORAL_TABLET | Freq: Every day | ORAL | Status: DC
  Administered 2018-02-13 – 2018-02-20 (×8): 20 mg via ORAL
  Filled 2018-02-12 (×8): qty 1

## 2018-02-12 MED ORDER — POLYSACCHARIDE IRON COMPLEX 150 MG PO CAPS
150.0000 mg | ORAL_CAPSULE | Freq: Every day | ORAL | Status: DC
  Administered 2018-02-13 – 2018-02-20 (×8): 150 mg via ORAL
  Filled 2018-02-12 (×8): qty 1

## 2018-02-12 MED ORDER — VITAMIN D3 25 MCG (1000 UNIT) PO TABS
1000.0000 [IU] | ORAL_TABLET | Freq: Every day | ORAL | Status: DC
  Administered 2018-02-13 – 2018-02-20 (×8): 1000 [IU] via ORAL
  Filled 2018-02-12 (×9): qty 1

## 2018-02-12 MED ORDER — VANCOMYCIN HCL 10 G IV SOLR
1500.0000 mg | Freq: Once | INTRAVENOUS | Status: DC
  Filled 2018-02-12: qty 1500

## 2018-02-12 MED ORDER — BUPROPION HCL ER (XL) 300 MG PO TB24
450.0000 mg | ORAL_TABLET | Freq: Every day | ORAL | Status: DC
  Administered 2018-02-13 – 2018-02-20 (×8): 450 mg via ORAL
  Filled 2018-02-12 (×8): qty 1

## 2018-02-12 NOTE — ED Notes (Signed)
ED TO INPATIENT HANDOFF REPORT  Name/Age/Gender [name] 78 y.o. female  Code Status Code Status History    Date Active Date Inactive Code Status Order ID Comments User Context   12/29/2017 1539 01/03/2018 1647 Full Code [id number]  Karmen Bongo, MD Inpatient   12/21/2017 1946 12/25/2017 1959 Full Code [id number]  Domenic Polite, MD Inpatient   01/28/2015 2018 01/31/2015 2223 Full Code [id number]  Samella Parr, NP ED   01/25/2015 0304 01/26/2015 2309 Full Code [id number]  Radene Gunning, NP Inpatient   07/29/2012 1623 07/31/2012 2147 Full Code [id number]  Benedetto Goad, PA-C Inpatient   06/16/2012 1406 06/17/2012 1412 Full Code [id number]  Earnstine Regal, MD Inpatient    Advance Directive Documentation     Most Recent Value  Type of Advance Directive  Healthcare Power of Attorney, Living will  Pre-existing out of facility DNR order (yellow form or pink MOST form)  -  "MOST" Form in Place?  -      Home/SNF/Other Nursing Home  Chief Complaint fall   Level of Care/Admitting Diagnosis ED Disposition    ED Disposition Condition Okay: [facility] [100102]  Level of Care: Telemetry [5]  Admit to tele based on following criteria: Monitor for Ischemic changes  Diagnosis: TIA (transient ischemic attack) [725366]  Admitting Physician: Caren Griffins [phone]  Attending Physician: Caren Griffins [[id number]]  PT Class (Do Not Modify): Observation [104]  PT Acc Code (Do Not Modify): Observation [10022]       Medical History Past Medical History:  Diagnosis Date  . Anxiety   . Arthritis    "legs, back" (07/29/2012)  . Cellulitis and abscess of leg 12/29/2017  . Chronic diastolic (congestive) heart failure ([place])   . Chronic lower back pain   . Complication of anesthesia    "I have apnea" (07/29/2012)  . 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 Family history of anesthesia complication    "some wake up during OR; some are  hard to wake up; some both" (07/29/2012)  . GERD (gastroesophageal reflux disease)   . 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 ([place])   . History of CVA (cerebrovascular accident) 02/01/2015  . History of stomach ulcers 1980's  . Hyperlipidemia   . Hyperparathyroidism, primary ([place]) 04/22/2012  . Hypertensive heart disease   . Incontinent of urine    wears pads  . Major depressive disorder, recurrent episode, severe ([place]) 11/21/2015  . OSA on CPAP   . Osteoarthritis of right knee   . Pedal edema   . Persistent atrial fibrillation    CHADS2VASC score is 7 - on chronic anticoagulation with Apixaban  . Spinal stenosis   . Umbilical hernia    "unrepaired" (07/29/2012)  . Varicose veins    "BLE" (07/29/2012)  . Vascular dementia ([facility])     Allergies Allergies  Allergen Reactions  . Ace Inhibitors Cough  . Lipitor [Atorvastatin] Swelling  . Simvastatin Other (See Comments)    Myalgias     . Latex Itching    IV Location/Drains/Wounds Patient Lines/Drains/Airways Status   Active Line/Drains/Airways    Name:   Placement date:   Placement time:   Site:   Days:   External Urinary Catheter   12/21/17    1915    -   53   Wound / Incision (Open or Dehisced) 12/29/17 Venous stasis ulcer Leg Left;Medial;Lower   12/29/17    1800  Leg   45          Labs/Imaging Results for orders placed or performed during the hospital encounter of 02/12/18 (from the past 48 hour(s))  Basic metabolic panel     Status: Abnormal   Collection Time: 02/12/18 12:46 PM  Result Value Ref Range   Sodium 139 135 - 145 mmol/L   Potassium 3.8 3.5 - 5.1 mmol/L   Chloride 104 98 - 111 mmol/L   CO2 27 22 - 32 mmol/L   Glucose, Bld 99 70 - 99 mg/dL   BUN 21 8 - 23 mg/dL   Creatinine, Ser 1.38 (H) 0.44 - 1.00 mg/dL   Calcium 10.0 8.9 - 10.3 mg/dL   GFR calc non Af Amer 36 (L) >60 mL/min   GFR calc Af Amer 42 (L) >60 mL/min   Anion gap 8 5 - 15    Comment: Performed at [facility], [place] [address], [place], [place] [address]  CBC with Differential/Platelet     Status: Abnormal   Collection Time: 02/12/18 12:46 PM  Result Value Ref Range   WBC 4.1 4.0 - 10.5 K/uL   RBC 3.41 (L) 3.87 - 5.11 MIL/uL   Hemoglobin 10.1 (L) 12.0 - 15.0 g/dL   HCT 32.9 (L) 36.0 - 46.0 %   MCV 96.5 80.0 - 100.0 fL   MCH 29.6 26.0 - 34.0 pg   MCHC 30.7 30.0 - 36.0 g/dL   RDW 15.6 (H) 11.5 - 15.5 %   Platelets 245 150 - 400 K/uL   nRBC 0.0 0.0 - 0.2 %   Neutrophils Relative % 63 %   Neutro Abs 2.6 1.7 - 7.7 K/uL   Lymphocytes Relative 19 %   Lymphs Abs 0.8 0.7 - 4.0 K/uL   Monocytes Relative 14 %   Monocytes Absolute 0.6 0.1 - 1.0 K/uL   Eosinophils Relative 2 %   Eosinophils Absolute 0.1 0.0 - 0.5 K/uL   Basophils Relative 1 %   Basophils Absolute 0.0 0.0 - 0.1 K/uL   Immature Granulocytes 1 %   Abs Immature Granulocytes 0.02 0.00 - 0.07 K/uL    Comment: Performed at [facility], [place] [address] [place], [place] [address]  Urinalysis, Routine w reflex microscopic     Status: None   Collection Time: 02/12/18  4:28 PM  Result Value Ref Range   Color, Urine YELLOW YELLOW   APPearance CLEAR CLEAR   Specific Gravity, Urine 1.009 1.005 - 1.030   pH 8.0 5.0 - 8.0   Glucose, UA NEGATIVE NEGATIVE mg/dL   Hgb urine dipstick NEGATIVE NEGATIVE   Bilirubin Urine NEGATIVE NEGATIVE   Ketones, ur NEGATIVE NEGATIVE mg/dL   Protein, ur NEGATIVE NEGATIVE mg/dL   Nitrite NEGATIVE NEGATIVE   Leukocytes, UA NEGATIVE NEGATIVE    Comment: Performed at [facility] [address] [place], [place] [address]   Dg Lumbar Spine 2-3 Views  Result Date: 02/12/2018 CLINICAL DATA:  Pain following fall EXAM: LUMBAR SPINE - 2-3 VIEW COMPARISON:  Lumbar MRI Jun 07, 2014 FINDINGS: Frontal, lateral, and spot lumbosacral lateral images were obtained. There are 5 non-rib-bearing lumbar type vertebral bodies. There is no acute fracture. There is 5 mm of  anterolisthesis of L4 on L5, stable. There is minimal anterolisthesis of L3 on L4, stable. No new spondylolisthesis. There is marked disc space narrowing at L5-S1. There is moderate disc space narrowing at L3-4 and L4-5 with vacuum phenomenon at these levels. No erosive changes.  There is facet osteoarthritic change at L3-4, L4-5, and L5-S1 bilaterally. There is aortic atherosclerosis. There are foci of mesh in the lower abdomen/upper pelvic wall regions. IMPRESSION: Osteoarthritic change at L3-4, L4-5, and L5-S1, most severe at L5-S1. spondylolisthesis at L4-5 and to a lesser extent at L3-4 appears stable. No fracture evident. There is aortic atherosclerosis. Electronically Signed   By: Lowella Grip III M.D.   On: 02/12/2018 13:31   Ct Head Wo Contrast  Result Date: 02/12/2018 CLINICAL DATA:  Head trauma, anticoagulation, fall from wheelchair EXAM: CT HEAD WITHOUT CONTRAST; CT CERVICAL SPINE WITHOUT CONTRAST TECHNIQUE: Contiguous axial images were obtained from the base of the skull through the vertex without intravenous contrast. COMPARISON:  12/21/2017 FINDINGS: CT HEAD FINDINGS Brain: No evidence of acute infarction, hemorrhage, hydrocephalus, extra-axial collection or mass lesion/mass effect. Periventricular white matter hypodensity. Vascular: No hyperdense vessel or unexpected calcification. Skull: Normal. Negative for fracture or focal lesion. Sinuses/Orbits: No acute finding. Other: None. CT CERVICAL SPINE FINDINGS Alignment: Degenerative straightening of the normal cervical lordosis. Skull base and vertebrae: No acute fracture. No primary bone lesion or focal pathologic process. There is a right hemi laminectomy of C1 versus congenital ossification variant. Soft tissues and spinal canal: No prevertebral fluid or swelling. No visible canal hematoma. Disc levels: Severe multilevel disc degenerative disease of the cervical spine. Upper chest: Negative. Other: None. IMPRESSION: 1. No acute intracranial  pathology. Small-vessel white matter disease. 2. No fracture or static subluxation of the cervical spine. Severe multilevel disc degenerative disease. Electronically Signed   By: Eddie Candle M.D.   On: 02/12/2018 13:18   Ct Cervical Spine Wo Contrast  Result Date: 02/12/2018 CLINICAL DATA:  Head trauma, anticoagulation, fall from wheelchair EXAM: CT HEAD WITHOUT CONTRAST; CT CERVICAL SPINE WITHOUT CONTRAST TECHNIQUE: Contiguous axial images were obtained from the base of the skull through the vertex without intravenous contrast. COMPARISON:  12/21/2017 FINDINGS: CT HEAD FINDINGS Brain: No evidence of acute infarction, hemorrhage, hydrocephalus, extra-axial collection or mass lesion/mass effect. Periventricular white matter hypodensity. Vascular: No hyperdense vessel or unexpected calcification. Skull: Normal. Negative for fracture or focal lesion. Sinuses/Orbits: No acute finding. Other: None. CT CERVICAL SPINE FINDINGS Alignment: Degenerative straightening of the normal cervical lordosis. Skull base and vertebrae: No acute fracture. No primary bone lesion or focal pathologic process. There is a right hemi laminectomy of C1 versus congenital ossification variant. Soft tissues and spinal canal: No prevertebral fluid or swelling. No visible canal hematoma. Disc levels: Severe multilevel disc degenerative disease of the cervical spine. Upper chest: Negative. Other: None. IMPRESSION: 1. No acute intracranial pathology. Small-vessel white matter disease. 2. No fracture or static subluxation of the cervical spine. Severe multilevel disc degenerative disease. Electronically Signed   By: Eddie Candle M.D.   On: 02/12/2018 13:18    Pending Labs [employer] (From admission, onward)    Start     Ordered   Signed and Held  Hemoglobin A1c  Tomorrow morning,   R     Signed and Held   Signed and Held  Lipid panel  Tomorrow morning,   R    Comments:  Fasting    Signed and Held          Vitals/Pain Today's  Vitals   02/12/18 1032 02/12/18 1034 02/12/18 1230 02/12/18 1630  BP:  (!) 185/79 (!) 173/88 (!) 176/144  Pulse:  84 82 78  Resp:  16 20 18   Temp:  97.6 F (36.4 C)    TempSrc:  Oral  SpO2:  98% 97% 96%  Weight: 90.7 kg     Height: 5\' 2"  (1.575 m)       Isolation Precautions No active isolations  Medications Medications  vancomycin (VANCOCIN) 1,500 mg in sodium chloride 0.9 % 500 mL IVPB (has no administration in time range)  LORazepam (ATIVAN) injection 0.5 mg (has no administration in time range)    Mobility non-ambulatory

## 2018-02-12 NOTE — ED Notes (Signed)
Patient placed on pure wick °

## 2018-02-12 NOTE — ED Notes (Signed)
Patient transported to MRI 

## 2018-02-12 NOTE — ED Triage Notes (Signed)
Per EMS: Pt from [place].  Pt slide out of wheelchair and fell on her bottom, unwitnessed.  No LOC.  Pt hx of Alzheimers. Pt has edema in L leg, pt hx of cellulitis in that leg. Pt hx of CVA with left sided deficits.

## 2018-02-12 NOTE — Progress Notes (Signed)
Pharmacy Antibiotic Note  Susan [name] is a 78 y.o. female with extensive past medical history including CVA, hypertension, hyperlipidemia, paroxysmal A. fib on chronic anticoagulation with Eliquis, spinal stenosis, and CKD III who wasadmitted on 02/12/2018 with recurrent LE cellulitis with 2 hospitalizations in December for the same.  Pharmacy has been consulted for vancomycin dosing.  Plan: Vancomycin 1500 mg iv q 36 hours. AUC 469 IBW/ABW.   F/U SCr, culture results, and plans for antiboitics  Height: 5\' 2"  (157.5 cm) Weight: 200 lb (90.7 kg) IBW/kg (Calculated) : 50.1  Temp (24hrs), Avg:97.6 F (36.4 C), Min:97.6 F (36.4 C), Max:97.6 F (36.4 C)  Recent Labs  Lab 02/06/18 1429 02/12/18 1246  WBC  --  4.1  CREATININE 1.43* 1.38*    Estimated Creatinine Clearance: 34 mL/min (A) (by C-G formula based on SCr of 1.38 mg/dL (H)).    Allergies  Allergen Reactions  . Ace Inhibitors Cough  . Lipitor [Atorvastatin] Swelling  . Simvastatin Other (See Comments)    Myalgias     . Latex Itching    Antimicrobials this admission: 2/6 vancomcyin >>   Dose adjustments this admission:  Microbiology results:  Thank you for allowing pharmacy to be a part of this patient's care.  Napoleon Form 02/12/2018 5:35 PM

## 2018-02-12 NOTE — H&P (Addendum)
History and Physical    Susan [name] [id number]:[id number] DOB: 04/18/1940 DOA: 02/12/2018  I have briefly reviewed the patient's prior medical records in [place]  PCP: Lauree Chandler, NP  Patient coming from: SNF  Chief Complaint: Fall, right leg pain  HPI: Susan [name] is a 78 y.o. female with medical history significant of prior CVA, hypertension, hyperlipidemia, paroxysmal A. fib on chronic anticoagulation with Eliquis, spinal stenosis, who presents to the hospital with chief complaint of having a fall as well as left lower extremity pain.  Patient has been dealing with left lower extremity cellulitis for the past 2 months.  She was hospitalized twice in December 2019, received a course of intravenous antibiotics in the hospital, she was transitioned to p.o., discharged and upon finishing up the p.o. antibiotics for cellulitis recurred.  Most recent discharge was at the end of December 2019, she had antibiotics for a few days at her SNF but she has now been off her antibiotics for the past 3 to 4 weeks.  For the last week, she has noticed increased redness and swelling of the left lower extremity, as well as increased pain.  She also has a history of severe left knee osteoarthritis followed by orthopedic surgery as an outpatient and they are contemplating total knee replacement (Dr. Erlinda Hong feels like her cellulitis need to be completely resolved before that is even a possibility).  Patient has mild underlying dementia and somewhat confused in the ED, her story is somewhat unreliable but the daughter is at bedside.  There are no reported fever or chills, but the daughter tells me that over the last 2 days patient has been more somnolent, and she feels like she has been having significantly more slurred speech as well as word finding difficulties.  Currently patient only complains of knee pain, left leg pain as well as back pain.  No chest pain, no shortness of breath, no palpitations.  ED Course: In  the ED he is afebrile, blood pressure is on the high side, she is satting well on room air.  Blood work is pertinent for creatinine of 1.38, white count 4.1, hemoglobin is 10.1.  She underwent an L-spine film which showed no acute fractures and osteoarthritic changes.  CT scan of the C-spine and head without acute findings.  We are asked to admit for recurrent left lower extremity cellulitis  Review of Systems: As per HPI otherwise 10 point review of systems negative.   Past Medical History:  Diagnosis Date  . Anxiety   . Arthritis    "legs, back" (07/29/2012)  . Cellulitis and abscess of leg 12/29/2017  . Chronic diastolic (congestive) heart failure ([place])   . Chronic lower back pain   . Complication of anesthesia    "I have apnea" (07/29/2012)  . 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 Family history of anesthesia complication    "some wake up during OR; some are hard to wake up; some both" (07/29/2012)  . GERD (gastroesophageal reflux disease)   . 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 ([place])   . History of CVA (cerebrovascular accident) 02/01/2015  . History of stomach ulcers 1980's  . Hyperlipidemia   . Hyperparathyroidism, primary ([place]) 04/22/2012  . Hypertensive heart disease   . Incontinent of urine    wears pads  . Major depressive disorder, recurrent episode, severe ([place]) 11/21/2015  . OSA on CPAP   . Osteoarthritis of right knee   . Pedal  edema   . Persistent atrial fibrillation    CHADS2VASC score is 7 - on chronic anticoagulation with Apixaban  . Spinal stenosis   . Umbilical hernia    "unrepaired" (07/29/2012)  . Varicose veins    "BLE" (07/29/2012)  . Vascular dementia [facility])     Past Surgical History:  Procedure Laterality Date  . CATARACT EXTRACTION    . CHOLECYSTECTOMY  ~ 2010  . COLONOSCOPY    . IUD REMOVAL  1980's  . PARATHYROIDECTOMY N/A 06/16/2012   Procedure: NECK EXPLORATION AND LEFT SUPERIOR PARATHYROIDECTOMY;  Surgeon:  Earnstine Regal, MD;  Location: [facility];  Service: General;  Laterality: N/A;  . 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 TOTAL KNEE ARTHROPLASTY Right 07/29/2012   Procedure: TOTAL KNEE ARTHROPLASTY;  Surgeon: Ninetta Lights, MD;  Location: [facility];  Service: Orthopedics;  Laterality: Right;     reports that she quit smoking about 40 years ago. Her smoking use included cigarettes. She has a 30.00 pack-year smoking history. She has never used smokeless tobacco. She reports that she does not drink alcohol or use drugs.  Allergies  Allergen Reactions  . Ace Inhibitors Cough  . Lipitor [Atorvastatin] Swelling  . Simvastatin Other (See Comments)    Myalgias     . Latex Itching    Family History  Problem Relation Age of Onset  . Hypertension Mother        deceased  . Stroke Mother   . Breast cancer Mother   . Lung cancer Father        deceased  . Diabetes Daughter   . Hypertension Daughter     Prior to Admission medications   Medication Sig Start Date End Date Taking? Authorizing Provider  apixaban (ELIQUIS) 5 MG TABS tablet Take 1 tablet (5 mg total) by mouth every 12 (twelve) hours. 01/26/15   Rai, Ripudeep K, MD  buPROPion (WELLBUTRIN XL) 150 MG 24 hr tablet Take 450 mg by mouth daily.    [provider]  Calcium Carbonate-Vitamin D 600-400 MG-UNIT tablet Take 1 tablet by mouth 2 (two) times daily. 12/22/17   Lauree Chandler, NP  carvedilol (COREG) 12.5 MG tablet Take 12.5 mg by mouth 2 (two) times daily with a meal.    [provider]  cholecalciferol (VITAMIN D) 1000 units tablet Take 1,000 Units by mouth daily.     [provider]  cyclobenzaprine (FLEXERIL) 5 MG tablet Take 5 mg by mouth 3 (three) times daily as needed for muscle spasms.    [provider]  diclofenac sodium (VOLTAREN) 1 % GEL Apply 2 g topically 4 (four) times daily. 10/07/17   Leandrew Koyanagi, MD  docusate sodium (COLACE) 100 MG capsule Take 100 mg by mouth daily.     [provider]  donepezil (ARICEPT) 10 MG tablet Take 1 tablet (10 mg total) by mouth at bedtime. For memory loss 11/17/17   Lauree Chandler, NP  furosemide (LASIX) 20 MG tablet Take 20 mg by mouth daily.    [provider]  gabapentin (NEURONTIN) 300 MG capsule Take 300 mg by mouth 2 (two) times daily.     [provider]  iron polysaccharides (IFEREX 150) 150 MG capsule Take 150 mg by mouth daily.    [provider]  losartan (COZAAR) 50 MG tablet Take 1 tablet (50 mg total) by mouth daily. 08/22/16   Sueanne Margarita, MD  Menthol, Topical Analgesic, (BIOFREEZE) 4 % GEL Apply 3 oz topically  3 (three) times daily. For knee pain 09/25/17   Ngetich, Dinah C, NP  Multiple Vitamin (MULTIVITAMIN WITH MINERALS) TABS Take 1 tablet by mouth daily.    [provider]  pantoprazole (PROTONIX) 40 MG tablet Take 40 mg by mouth daily.    [provider]  saccharomyces boulardii (FLORASTOR) 250 MG capsule Take 1 capsule (250 mg total) by mouth 2 (two) times daily. 12/25/17   Georgette Shell, MD  sertraline (ZOLOFT) 25 MG tablet Take 0.5 tablets (12.5 mg total) by mouth 2 (two) times daily. 08/15/17   Gildardo Cranker, DO  traMADol (ULTRAM) 50 MG tablet Take 1 tablet (50 mg total) by mouth every 6 (six) hours as needed. 01/03/18 01/03/19  Debbe Odea, MD  vitamin B-12 (CYANOCOBALAMIN) 1000 MCG tablet Take 2,000 mcg by mouth daily.    [provider]    Physical Exam: Vitals:   02/12/18 1025 02/12/18 1032 02/12/18 1034 02/12/18 1230  BP:   (!) 185/79 (!) 173/88  Pulse:   84 82  Resp:   16 20  Temp:   97.6 F (36.4 C)   TempSrc:   Oral   SpO2: 100%  98% 97%  Weight:  90.7 kg    Height:  5\' 2"  (1.575 m)      Constitutional: NAD, calm, comfortable Eyes: PERRL, lids and conjunctivae normal ENMT: Mucous membranes are moist.  Neck: normal, supple Respiratory: clear to auscultation bilaterally, no wheezing, no crackles. Normal respiratory  effort.  Cardiovascular: Regular rate and rhythm, no murmurs / rubs / gallops.  1+ bilateral lower extremity edema, more on the left. 2+ pedal pulses.  Abdomen: no tenderness, no masses palpated. Bowel sounds positive.  Musculoskeletal: no clubbing / cyanosis.  Skin: Cellulitic rash on her left lower extremity extending from left ankle to just below the left knee, extremely tender to palpation, warm to touch Neurologic: she does have word finding difficulty and slurred speech.  Cranial nerves grossly intact.  Slight right upper extremity weakness when compared rest, (family tells me chronic from her old CVA) Psychiatric: Normal judgment and insight. Alert and oriented x 3. Normal mood.   Labs on Admission: I have personally reviewed following labs and imaging studies  CBC: Recent Labs  Lab 02/12/18 1246  WBC 4.1  NEUTROABS 2.6  HGB 10.1*  HCT 32.9*  MCV 96.5  PLT 144   Basic Metabolic Panel: Recent Labs  Lab 02/06/18 1429 02/12/18 1246  NA 141 139  K 4.2 3.8  CL 106 104  CO2 27 27  GLUCOSE 123 99  BUN 21 21  CREATININE 1.43* 1.38*  CALCIUM 10.7* 10.0   GFR: Estimated Creatinine Clearance: 34 mL/min (A) (by C-G formula based on SCr of 1.38 mg/dL (H)). Liver Function Tests: Recent Labs  Lab 02/06/18 1429  AST 27  ALT 15  BILITOT 0.4  PROT 6.8   No results for input(s): LIPASE, AMYLASE in the last 168 hours. No results for input(s): AMMONIA in the last 168 hours. Coagulation Profile: No results for input(s): INR, PROTIME in the last 168 hours. Cardiac Enzymes: No results for input(s): CKTOTAL, CKMB, CKMBINDEX, TROPONINI in the last 168 hours. BNP (last 3 results) No results for input(s): PROBNP in the last 8760 hours. HbA1C: No results for input(s): HGBA1C in the last 72 hours. CBG: No results for input(s): GLUCAP in the last 168 hours. Lipid Profile: No results for input(s): CHOL, HDL, LDLCALC, TRIG, CHOLHDL, LDLDIRECT in the last 72 hours. Thyroid Function  Tests: No results  for input(s): TSH, T4TOTAL, FREET4, T3FREE, THYROIDAB in the last 72 hours. Anemia Panel: No results for input(s): VITAMINB12, FOLATE, FERRITIN, TIBC, IRON, RETICCTPCT in the last 72 hours. Urine analysis:    Component Value Date/Time   COLORURINE YELLOW 12/29/2017 [place] 12/29/2017 1348   LABSPEC <1.005 (L) 12/29/2017 1348   PHURINE 5.5 12/29/2017 1348   GLUCOSEU NEGATIVE 12/29/2017 1348   HGBUR NEGATIVE 12/29/2017 1348   BILIRUBINUR NEGATIVE 12/29/2017 1348   KETONESUR NEGATIVE 12/29/2017 1348   PROTEINUR NEGATIVE 12/29/2017 1348   UROBILINOGEN 0.2 03/05/2013 1259   NITRITE NEGATIVE 12/29/2017 1348   LEUKOCYTESUR NEGATIVE 12/29/2017 1348     Radiological Exams on Admission: Dg Lumbar Spine 2-3 Views  Result Date: 02/12/2018 CLINICAL DATA:  Pain following fall EXAM: LUMBAR SPINE - 2-3 VIEW COMPARISON:  Lumbar MRI Jun 07, 2014 FINDINGS: Frontal, lateral, and spot lumbosacral lateral images were obtained. There are 5 non-rib-bearing lumbar type vertebral bodies. There is no acute fracture. There is 5 mm of anterolisthesis of L4 on L5, stable. There is minimal anterolisthesis of L3 on L4, stable. No new spondylolisthesis. There is marked disc space narrowing at L5-S1. There is moderate disc space narrowing at L3-4 and L4-5 with vacuum phenomenon at these levels. No erosive changes. There is facet osteoarthritic change at L3-4, L4-5, and L5-S1 bilaterally. There is aortic atherosclerosis. There are foci of mesh in the lower abdomen/upper pelvic wall regions. IMPRESSION: Osteoarthritic change at L3-4, L4-5, and L5-S1, most severe at L5-S1. spondylolisthesis at L4-5 and to a lesser extent at L3-4 appears stable. No fracture evident. There is aortic atherosclerosis. Electronically Signed   By: Lowella Grip III M.D.   On: 02/12/2018 13:31   Ct Head Wo Contrast  Result Date: 02/12/2018 CLINICAL DATA:  Head trauma, anticoagulation, fall from wheelchair  EXAM: CT HEAD WITHOUT CONTRAST; CT CERVICAL SPINE WITHOUT CONTRAST TECHNIQUE: Contiguous axial images were obtained from the base of the skull through the vertex without intravenous contrast. COMPARISON:  12/21/2017 FINDINGS: CT HEAD FINDINGS Brain: No evidence of acute infarction, hemorrhage, hydrocephalus, extra-axial collection or mass lesion/mass effect. Periventricular white matter hypodensity. Vascular: No hyperdense vessel or unexpected calcification. Skull: Normal. Negative for fracture or focal lesion. Sinuses/Orbits: No acute finding. Other: None. CT CERVICAL SPINE FINDINGS Alignment: Degenerative straightening of the normal cervical lordosis. Skull base and vertebrae: No acute fracture. No primary bone lesion or focal pathologic process. There is a right hemi laminectomy of C1 versus congenital ossification variant. Soft tissues and spinal canal: No prevertebral fluid or swelling. No visible canal hematoma. Disc levels: Severe multilevel disc degenerative disease of the cervical spine. Upper chest: Negative. Other: None. IMPRESSION: 1. No acute intracranial pathology. Small-vessel white matter disease. 2. No fracture or static subluxation of the cervical spine. Severe multilevel disc degenerative disease. Electronically Signed   By: Eddie Candle M.D.   On: 02/12/2018 13:18   Ct Cervical Spine Wo Contrast  Result Date: 02/12/2018 CLINICAL DATA:  Head trauma, anticoagulation, fall from wheelchair EXAM: CT HEAD WITHOUT CONTRAST; CT CERVICAL SPINE WITHOUT CONTRAST TECHNIQUE: Contiguous axial images were obtained from the base of the skull through the vertex without intravenous contrast. COMPARISON:  12/21/2017 FINDINGS: CT HEAD FINDINGS Brain: No evidence of acute infarction, hemorrhage, hydrocephalus, extra-axial collection or mass lesion/mass effect. Periventricular white matter hypodensity. Vascular: No hyperdense vessel or unexpected calcification. Skull: Normal. Negative for fracture or focal lesion.  Sinuses/Orbits: No acute finding. Other: None. CT CERVICAL SPINE FINDINGS Alignment: Degenerative straightening of the normal cervical  lordosis. Skull base and vertebrae: No acute fracture. No primary bone lesion or focal pathologic process. There is a right hemi laminectomy of C1 versus congenital ossification variant. Soft tissues and spinal canal: No prevertebral fluid or swelling. No visible canal hematoma. Disc levels: Severe multilevel disc degenerative disease of the cervical spine. Upper chest: Negative. Other: None. IMPRESSION: 1. No acute intracranial pathology. Small-vessel white matter disease. 2. No fracture or static subluxation of the cervical spine. Severe multilevel disc degenerative disease. Electronically Signed   By: Eddie Candle M.D.   On: 02/12/2018 13:18    EKG: Independently reviewed.  Shows sinus rhythm  Assessment/Plan Principal Problem:   Cellulitis Active Problems:   TIA (transient ischemic attack)   History of CVA (cerebrovascular accident)   HTN (hypertension)   Principal Problem Left lower extremity cellulitis -This is seem to be recurrent, this is her third episode in the last 2 months.  She seems to be improving with IV antibiotics and then transition to p.o. however 1 to 2 weeks after antibiotics are stopped it seems like it recurs.  She is quite tender to palpation as well as red and warm, will place on IV antibiotics with vancomycin and ceftriaxone.  May need longer 10-14-day course of intravenous antibiotics  Active Problems Slurred speech/word finding difficulties, with concern for TIA versus CVA -This does appear to be somewhat subacute, will obtain an MRI of the brain, hold off on ordering 2D echo and carotids unless the MRI is positive. -Continue Eliquis for her history of paroxysmal A. Fib  Paroxysmal A. fib -Monitor on telemetry, continue home medications along with Eliquis  Chronic kidney disease stage III -Baseline creatinine ranging 1.2-1.4,  currently at baseline  Chronic systolic CHF -Most recent echo done in 2017 showed recovered EF to 55 to 60%, from prior values of 35%. She had a prior dilated cardiomyopathy.  She does not look fluid overloaded other than chronic bilateral lower extremity swelling -Continue Lasix  Mild dementia -Continue Aricept  Hypertension -Continue home medications, if MRI positive.  To allow permissive hypertension  Severe osteoarthritis -Especially in her left knee, followed as an outpatient   DVT prophylaxis: Currently on Eliquis Code Status: Full code Family Communication: Daughter and granddaughter is present at bedside Disposition Plan: Admit to telemetry, likely SNF when ready Consults called: None   Marzetta Board, MD, PhD [facility]  Contact via [email or web address]  [facility] Office Info P: [phone]  F: [phone]   02/12/2018, 4:19 PM

## 2018-02-12 NOTE — ED Provider Notes (Signed)
[facility] DEPT Provider Note   CSN: [id number] Arrival date & time: 02/12/18  1015     History   Chief Complaint Chief Complaint  Patient presents with  . Back Pain  . Fall    HPI BRAD MCGAUGHY is a 78 y.o. female.  HPI Patient is coming from nursing home after unwitnessed fall while trying to get up out of her bed and walk to her wheelchair.  No known head injury or loss of consciousness.  Patient does have a history of dementia and is an unreliable historian.  She complains of low back pain which is chronic per family.  She has ongoing left lower extremity swelling and redness.  She is been treated with multiple doses of IV and oral antibiotics for cellulitis.  Patient is on anticoagulation for atrial fibrillation. Past Medical History:  Diagnosis Date  . Anxiety   . Arthritis    "legs, back" (07/29/2012)  . Cellulitis and abscess of leg 12/29/2017  . Chronic diastolic (congestive) heart failure ([place])   . Chronic lower back pain   . Complication of anesthesia    "I have apnea" (07/29/2012)  . 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 Family history of anesthesia complication    "some wake up during OR; some are hard to wake up; some both" (07/29/2012)  . GERD (gastroesophageal reflux disease)   . 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 ([place])   . History of CVA (cerebrovascular accident) 02/01/2015  . History of stomach ulcers 1980's  . Hyperlipidemia   . Hyperparathyroidism, primary ([place]) 04/22/2012  . Hypertensive heart disease   . Incontinent of urine    wears pads  . Major depressive disorder, recurrent episode, severe ([place]) 11/21/2015  . OSA on CPAP   . Osteoarthritis of right knee   . Pedal edema   . Persistent atrial fibrillation    CHADS2VASC score is 7 - on chronic anticoagulation with Apixaban  . Spinal stenosis   . Umbilical hernia    "unrepaired" (07/29/2012)  . Varicose veins    "BLE"  (07/29/2012)  . Vascular dementia [facility])     Patient Active Problem List   Diagnosis Date Noted  . Cellulitis and abscess of leg 12/29/2017  . CKD (chronic kidney disease) stage 3, GFR 30-59 ml/min (HCC) 12/29/2017  . Cellulitis of left lower extremity   . Ankle edema   . Sepsis ([place]) 12/21/2017  . Medication management 10/11/2016  . Hyperlipemia 06/18/2016  . Gait abnormality 06/18/2016  . Major depressive disorder, recurrent episode, severe ([place]) 11/21/2015  . Edema 02/09/2015  . History of CVA (cerebrovascular accident) 02/01/2015  . 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 ([place])   . 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 Chronic low back pain 01/28/2015  . Short-term memory loss 01/28/2015  . Volume depletion 01/28/2015  . Persistent atrial fibrillation 01/26/2015  . DCM (dilated cardiomyopathy) ([place]) 01/26/2015  . TIA (transient ischemic attack) 01/24/2015  . Chronic diastolic (congestive) heart failure ([place])   . Obesity (BMI 30-39.9) 01/27/2013  . Constipation 09/02/2012  . H/O arthroscopy of right knee 08/06/2012  . Anxiety   . Depression   . GERD (gastroesophageal reflux disease)   . Hypertensive heart disease   . OSA (obstructive sleep apnea)   . Osteoarthritis of right knee   . Hyperparathyroidism, primary ([place]) 04/22/2012  . Chronic cough 10/02/2011    Past Surgical History:  Procedure Laterality Date  .  CATARACT EXTRACTION    . CHOLECYSTECTOMY  ~ 2010  . COLONOSCOPY    . IUD REMOVAL  1980's  . PARATHYROIDECTOMY N/A 06/16/2012   Procedure: NECK EXPLORATION AND LEFT SUPERIOR PARATHYROIDECTOMY;  Surgeon: Earnstine Regal, MD;  Location: [facility];  Service: General;  Laterality: N/A;  . 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 TOTAL KNEE ARTHROPLASTY Right 07/29/2012   Procedure: TOTAL KNEE ARTHROPLASTY;  Surgeon: Ninetta Lights, MD;  Location: [facility];  Service: Orthopedics;  Laterality: Right;     OB History    Gravida  3    Para  1   Term      Preterm      AB  0   Living  3     SAB      TAB      Ectopic      Multiple  0   Live Births               Home Medications    Prior to Admission medications   Medication Sig Start Date End Date Taking? Authorizing Provider  apixaban (ELIQUIS) 5 MG TABS tablet Take 1 tablet (5 mg total) by mouth every 12 (twelve) hours. 01/26/15   Rai, Ripudeep K, MD  buPROPion (WELLBUTRIN XL) 150 MG 24 hr tablet Take 450 mg by mouth daily.    [provider]  Calcium Carbonate-Vitamin D 600-400 MG-UNIT tablet Take 1 tablet by mouth 2 (two) times daily. 12/22/17   Lauree Chandler, NP  carvedilol (COREG) 12.5 MG tablet Take 12.5 mg by mouth 2 (two) times daily with a meal.    [provider]  cholecalciferol (VITAMIN D) 1000 units tablet Take 1,000 Units by mouth daily.     [provider]  cyclobenzaprine (FLEXERIL) 5 MG tablet Take 5 mg by mouth 3 (three) times daily as needed for muscle spasms.    [provider]  diclofenac sodium (VOLTAREN) 1 % GEL Apply 2 g topically 4 (four) times daily. 10/07/17   Leandrew Koyanagi, MD  docusate sodium (COLACE) 100 MG capsule Take 100 mg by mouth daily.    [provider]  donepezil (ARICEPT) 10 MG tablet Take 1 tablet (10 mg total) by mouth at bedtime. For memory loss 11/17/17   Lauree Chandler, NP  furosemide (LASIX) 20 MG tablet Take 20 mg by mouth daily.    [provider]  gabapentin (NEURONTIN) 300 MG capsule Take 300 mg by mouth 2 (two) times daily.     [provider]  iron polysaccharides (IFEREX 150) 150 MG capsule Take 150 mg by mouth daily.    [provider]  losartan (COZAAR) 50 MG tablet Take 1 tablet (50 mg total) by mouth daily. 08/22/16   Sueanne Margarita, MD  Menthol, Topical Analgesic, (BIOFREEZE) 4 % GEL Apply 3 oz topically 3 (three) times daily. For knee pain 09/25/17   Ngetich, Dinah C, NP  Multiple Vitamin (MULTIVITAMIN WITH MINERALS)  TABS Take 1 tablet by mouth daily.    [provider]  pantoprazole (PROTONIX) 40 MG tablet Take 40 mg by mouth daily.    [provider]  saccharomyces boulardii (FLORASTOR) 250 MG capsule Take 1 capsule (250 mg total) by mouth 2 (two) times daily. 12/25/17   Georgette Shell, MD  sertraline (ZOLOFT) 25 MG tablet Take 0.5 tablets (12.5 mg total) by mouth 2 (two) times daily. 08/15/17   Gildardo Cranker, DO  traMADol (ULTRAM) 50 MG tablet Take  1 tablet (50 mg total) by mouth every 6 (six) hours as needed. 01/03/18 01/03/19  Debbe Odea, MD  vitamin B-12 (CYANOCOBALAMIN) 1000 MCG tablet Take 2,000 mcg by mouth daily.    [provider]    Family History Family History  Problem Relation Age of Onset  . Hypertension Mother        deceased  . Stroke Mother   . Breast cancer Mother   . Lung cancer Father        deceased  . Diabetes Daughter   . Hypertension Daughter     Social History Social History   Tobacco Use  . Smoking status: Former Smoker    Packs/day: 1.00    Years: 30.00    Pack years: 30.00    Types: Cigarettes    Last attempt to quit: 01/07/1978    Years since quitting: 40.1  . Smokeless tobacco: Never Used  Substance Use Topics  . Alcohol use: No  . Drug use: No     Allergies   Ace inhibitors; Lipitor [atorvastatin]; Simvastatin; and Latex   Review of Systems Review of Systems  Unable to perform ROS: Dementia  Cardiovascular: Positive for leg swelling.  Musculoskeletal: Positive for back pain.  Skin: Positive for color change.     Physical Exam Updated Vital Signs BP (!) 173/88   Pulse 82   Temp 97.6 F (36.4 C) (Oral)   Resp 20   Ht 5\' 2"  (1.575 m)   Wt 90.7 kg   SpO2 97%   BMI 36.58 kg/m   Physical Exam Vitals signs and nursing note reviewed.  Constitutional:      Appearance: Normal appearance. She is well-developed.  HENT:     Head: Normocephalic and atraumatic.     Comments: No obvious head injury.  Midface  is stable.  No intraoral injury.    Nose: Nose normal. No congestion or rhinorrhea.     Mouth/Throat:     Mouth: Mucous membranes are moist.  Eyes:     Extraocular Movements: Extraocular movements intact.     Pupils: Pupils are equal, round, and reactive to light.  Neck:     Comments: Cervical collar in place. Cardiovascular:     Rate and Rhythm: Normal rate and regular rhythm.     Heart sounds: No murmur. No gallop.   Pulmonary:     Effort: Pulmonary effort is normal. No respiratory distress.     Breath sounds: Normal breath sounds. No stridor. No wheezing, rhonchi or rales.  Chest:     Chest wall: No tenderness.  Abdominal:     General: Bowel sounds are normal.     Palpations: Abdomen is soft.     Tenderness: There is no abdominal tenderness. There is no guarding or rebound.  Musculoskeletal: Normal range of motion.        General: Swelling and tenderness present.     Left lower leg: Edema present.     Comments: Patient with left lower extremity redness and swelling that extends up to the distal thigh.  Appears to have full range of motion of ankle and knee without obvious discomfort.  Distal pulses intact.  Skin:    General: Skin is warm and dry.     Findings: Erythema present. No rash.  Neurological:     General: No focal deficit present.     Mental Status: She is alert.     Comments: Oriented to self.  Moving all extremities without focal deficit.  Sensation intact.  Psychiatric:  Behavior: Behavior normal.      ED Treatments / Results  Labs (all labs ordered are listed, but only abnormal results are displayed) Labs Reviewed  BASIC METABOLIC PANEL - Abnormal; Notable for the following components:      Result Value   Creatinine, Ser 1.38 (*)    GFR calc non Af Amer 36 (*)    GFR calc Af Amer 42 (*)    All other components within normal limits  CBC WITH DIFFERENTIAL/PLATELET - Abnormal; Notable for the following components:   RBC 3.41 (*)    Hemoglobin 10.1  (*)    HCT 32.9 (*)    RDW 15.6 (*)    All other components within normal limits  URINALYSIS, ROUTINE W REFLEX MICROSCOPIC    EKG None  Radiology Dg Lumbar Spine 2-3 Views  Result Date: 02/12/2018 CLINICAL DATA:  Pain following fall EXAM: LUMBAR SPINE - 2-3 VIEW COMPARISON:  Lumbar MRI Jun 07, 2014 FINDINGS: Frontal, lateral, and spot lumbosacral lateral images were obtained. There are 5 non-rib-bearing lumbar type vertebral bodies. There is no acute fracture. There is 5 mm of anterolisthesis of L4 on L5, stable. There is minimal anterolisthesis of L3 on L4, stable. No new spondylolisthesis. There is marked disc space narrowing at L5-S1. There is moderate disc space narrowing at L3-4 and L4-5 with vacuum phenomenon at these levels. No erosive changes. There is facet osteoarthritic change at L3-4, L4-5, and L5-S1 bilaterally. There is aortic atherosclerosis. There are foci of mesh in the lower abdomen/upper pelvic wall regions. IMPRESSION: Osteoarthritic change at L3-4, L4-5, and L5-S1, most severe at L5-S1. spondylolisthesis at L4-5 and to a lesser extent at L3-4 appears stable. No fracture evident. There is aortic atherosclerosis. Electronically Signed   By: Lowella Grip III M.D.   On: 02/12/2018 13:31   Ct Head Wo Contrast  Result Date: 02/12/2018 CLINICAL DATA:  Head trauma, anticoagulation, fall from wheelchair EXAM: CT HEAD WITHOUT CONTRAST; CT CERVICAL SPINE WITHOUT CONTRAST TECHNIQUE: Contiguous axial images were obtained from the base of the skull through the vertex without intravenous contrast. COMPARISON:  12/21/2017 FINDINGS: CT HEAD FINDINGS Brain: No evidence of acute infarction, hemorrhage, hydrocephalus, extra-axial collection or mass lesion/mass effect. Periventricular white matter hypodensity. Vascular: No hyperdense vessel or unexpected calcification. Skull: Normal. Negative for fracture or focal lesion. Sinuses/Orbits: No acute finding. Other: None. CT CERVICAL SPINE FINDINGS  Alignment: Degenerative straightening of the normal cervical lordosis. Skull base and vertebrae: No acute fracture. No primary bone lesion or focal pathologic process. There is a right hemi laminectomy of C1 versus congenital ossification variant. Soft tissues and spinal canal: No prevertebral fluid or swelling. No visible canal hematoma. Disc levels: Severe multilevel disc degenerative disease of the cervical spine. Upper chest: Negative. Other: None. IMPRESSION: 1. No acute intracranial pathology. Small-vessel white matter disease. 2. No fracture or static subluxation of the cervical spine. Severe multilevel disc degenerative disease. Electronically Signed   By: Eddie Candle M.D.   On: 02/12/2018 13:18   Ct Cervical Spine Wo Contrast  Result Date: 02/12/2018 CLINICAL DATA:  Head trauma, anticoagulation, fall from wheelchair EXAM: CT HEAD WITHOUT CONTRAST; CT CERVICAL SPINE WITHOUT CONTRAST TECHNIQUE: Contiguous axial images were obtained from the base of the skull through the vertex without intravenous contrast. COMPARISON:  12/21/2017 FINDINGS: CT HEAD FINDINGS Brain: No evidence of acute infarction, hemorrhage, hydrocephalus, extra-axial collection or mass lesion/mass effect. Periventricular white matter hypodensity. Vascular: No hyperdense vessel or unexpected calcification. Skull: Normal. Negative for fracture or focal lesion.  Sinuses/Orbits: No acute finding. Other: None. CT CERVICAL SPINE FINDINGS Alignment: Degenerative straightening of the normal cervical lordosis. Skull base and vertebrae: No acute fracture. No primary bone lesion or focal pathologic process. There is a right hemi laminectomy of C1 versus congenital ossification variant. Soft tissues and spinal canal: No prevertebral fluid or swelling. No visible canal hematoma. Disc levels: Severe multilevel disc degenerative disease of the cervical spine. Upper chest: Negative. Other: None. IMPRESSION: 1. No acute intracranial pathology. Small-vessel  white matter disease. 2. No fracture or static subluxation of the cervical spine. Severe multilevel disc degenerative disease. Electronically Signed   By: Eddie Candle M.D.   On: 02/12/2018 13:18    Procedures Procedures (including critical care time)  Medications Ordered in ED Medications - No data to display   Initial Impression / Assessment and Plan / ED Course  I have reviewed the triage vital signs and the nursing notes.  Pertinent labs & imaging results that were available during my care of the patient were reviewed by me and considered in my medical decision making (see chart for details).     Work-up for fall is negative for acute injury.  Patient does have persistent left leg swelling and redness.  Concern for cellulitis.  Discussed with hospitalist who will evaluate patient in the emergency department.  Likely needs admission for IV antibiotics.  Final Clinical Impressions(s) / ED Diagnoses   Final diagnoses:  Fall, initial encounter  Left leg cellulitis    ED Discharge Orders    None       Julianne Rice, MD 02/12/18 [phone]

## 2018-02-12 NOTE — ED Notes (Signed)
Unsuccessful IV attempt x1. IV consult placed. 

## 2018-02-12 NOTE — ED Notes (Signed)
Pt will be transported to 1437 after MRI is finished.  Floor RN made aware

## 2018-02-12 NOTE — ED Notes (Signed)
Bed: FX83 Expected date:  Expected time:  Means of arrival:  Comments: EMS 78yo fall

## 2018-02-13 ENCOUNTER — Inpatient Hospital Stay (HOSPITAL_COMMUNITY): Payer: PPO

## 2018-02-13 DIAGNOSIS — G8929 Other chronic pain: Secondary | ICD-10-CM | POA: Diagnosis present

## 2018-02-13 DIAGNOSIS — M48062 Spinal stenosis, lumbar region with neurogenic claudication: Secondary | ICD-10-CM | POA: Diagnosis present

## 2018-02-13 DIAGNOSIS — M7989 Other specified soft tissue disorders: Secondary | ICD-10-CM

## 2018-02-13 DIAGNOSIS — G459 Transient cerebral ischemic attack, unspecified: Secondary | ICD-10-CM | POA: Diagnosis not present

## 2018-02-13 DIAGNOSIS — Z8673 Personal history of transient ischemic attack (TIA), and cerebral infarction without residual deficits: Secondary | ICD-10-CM | POA: Diagnosis not present

## 2018-02-13 DIAGNOSIS — M109 Gout, unspecified: Secondary | ICD-10-CM | POA: Diagnosis present

## 2018-02-13 DIAGNOSIS — I5022 Chronic systolic (congestive) heart failure: Secondary | ICD-10-CM | POA: Diagnosis present

## 2018-02-13 DIAGNOSIS — L03116 Cellulitis of left lower limb: Secondary | ICD-10-CM | POA: Diagnosis present

## 2018-02-13 DIAGNOSIS — N183 Chronic kidney disease, stage 3 (moderate): Secondary | ICD-10-CM | POA: Diagnosis present

## 2018-02-13 DIAGNOSIS — Z87891 Personal history of nicotine dependence: Secondary | ICD-10-CM | POA: Diagnosis not present

## 2018-02-13 DIAGNOSIS — I4819 Other persistent atrial fibrillation: Secondary | ICD-10-CM | POA: Diagnosis present

## 2018-02-13 DIAGNOSIS — W19XXXA Unspecified fall, initial encounter: Secondary | ICD-10-CM

## 2018-02-13 DIAGNOSIS — I10 Essential (primary) hypertension: Secondary | ICD-10-CM | POA: Diagnosis not present

## 2018-02-13 DIAGNOSIS — I130 Hypertensive heart and chronic kidney disease with heart failure and stage 1 through stage 4 chronic kidney disease, or unspecified chronic kidney disease: Secondary | ICD-10-CM | POA: Diagnosis present

## 2018-02-13 DIAGNOSIS — F332 Major depressive disorder, recurrent severe without psychotic features: Secondary | ICD-10-CM | POA: Diagnosis present

## 2018-02-13 DIAGNOSIS — L0390 Cellulitis, unspecified: Secondary | ICD-10-CM | POA: Diagnosis not present

## 2018-02-13 DIAGNOSIS — F0150 Vascular dementia without behavioral disturbance: Secondary | ICD-10-CM | POA: Diagnosis present

## 2018-02-13 DIAGNOSIS — Z9989 Dependence on other enabling machines and devices: Secondary | ICD-10-CM | POA: Diagnosis not present

## 2018-02-13 DIAGNOSIS — T504X5A Adverse effect of drugs affecting uric acid metabolism, initial encounter: Secondary | ICD-10-CM | POA: Diagnosis present

## 2018-02-13 DIAGNOSIS — I69351 Hemiplegia and hemiparesis following cerebral infarction affecting right dominant side: Secondary | ICD-10-CM | POA: Diagnosis not present

## 2018-02-13 DIAGNOSIS — L03119 Cellulitis of unspecified part of limb: Secondary | ICD-10-CM | POA: Diagnosis not present

## 2018-02-13 DIAGNOSIS — I420 Dilated cardiomyopathy: Secondary | ICD-10-CM | POA: Diagnosis present

## 2018-02-13 DIAGNOSIS — K219 Gastro-esophageal reflux disease without esophagitis: Secondary | ICD-10-CM | POA: Diagnosis present

## 2018-02-13 DIAGNOSIS — M79605 Pain in left leg: Secondary | ICD-10-CM | POA: Diagnosis present

## 2018-02-13 DIAGNOSIS — D702 Other drug-induced agranulocytosis: Secondary | ICD-10-CM | POA: Diagnosis present

## 2018-02-13 DIAGNOSIS — G4733 Obstructive sleep apnea (adult) (pediatric): Secondary | ICD-10-CM | POA: Diagnosis present

## 2018-02-13 DIAGNOSIS — M1712 Unilateral primary osteoarthritis, left knee: Secondary | ICD-10-CM | POA: Diagnosis present

## 2018-02-13 DIAGNOSIS — Z9181 History of falling: Secondary | ICD-10-CM | POA: Diagnosis not present

## 2018-02-13 DIAGNOSIS — Z96651 Presence of right artificial knee joint: Secondary | ICD-10-CM | POA: Diagnosis present

## 2018-02-13 DIAGNOSIS — I69322 Dysarthria following cerebral infarction: Secondary | ICD-10-CM | POA: Diagnosis not present

## 2018-02-13 DIAGNOSIS — I69319 Unspecified symptoms and signs involving cognitive functions following cerebral infarction: Secondary | ICD-10-CM | POA: Diagnosis not present

## 2018-02-13 DIAGNOSIS — E210 Primary hyperparathyroidism: Secondary | ICD-10-CM | POA: Diagnosis present

## 2018-02-13 LAB — LIPID PANEL
Cholesterol: 179 mg/dL (ref 0–200)
HDL: 63 mg/dL (ref >40)
LDL Cholesterol: 103 mg/dL — ABNORMAL HIGH (ref 0–99)
Total CHOL/HDL Ratio: 2.8 RATIO
Triglycerides: 65 mg/dL (ref <150)
VLDL: 13 mg/dL (ref 0–40)

## 2018-02-13 LAB — HEMOGLOBIN A1C
HEMOGLOBIN A1C: 5.9 % — AB (ref 4.8–5.6)
MEAN PLASMA GLUCOSE: 122.63 mg/dL

## 2018-02-13 LAB — MRSA PCR SCREENING: MRSA by PCR: NEGATIVE

## 2018-02-13 LAB — URIC ACID: Uric Acid, Serum: 7.2 mg/dL — ABNORMAL HIGH (ref 2.5–7.1)

## 2018-02-13 MED ORDER — VANCOMYCIN HCL 10 G IV SOLR
1500.0000 mg | INTRAVENOUS | Status: DC
  Administered 2018-02-13: 1500 mg via INTRAVENOUS
  Filled 2018-02-13: qty 1500

## 2018-02-13 NOTE — Evaluation (Signed)
Physical Therapy Evaluation Patient Details Name: Susan [name] MRN: [id number] DOB: 14-Aug-1940 Today's Date: 02/13/2018   History of Present Illness  78 y.o. female with medical history significant of prior CVA, hypertension, hyperlipidemia, paroxysmal A. fib on chronic anticoagulation with Eliquis, spinal stenosis, vascular dementia, chronic back pain who presents to the hospital with chief complaint of having a fall as well as left lower extremity pain and admitted for recurrent left lower extremity cellulitis  Clinical Impression  Pt admitted with above diagnosis. Pt currently with functional limitations due to the deficits listed below (see PT Problem List).  Pt will benefit from skilled PT to increase their independence and safety with mobility to allow discharge to the venue listed below.   Pt very slow to perform mobility however reports this is baseline.  Pt attempted to stand pivot to [facility] however unable due to weakness.  Pt assisted back to supine and bed pan placed (RN aware).  Pt would benefit from SNF to assist pt with returning to PLOF prior to return to her apt (in facility).     Follow Up Recommendations SNF    Equipment Recommendations  None recommended by PT    Recommendations for Other Services       Precautions / Restrictions Precautions Precautions: Fall Precaution Comments: dysarthric speech, word finding difficulty      Mobility  Bed Mobility Overal bed mobility: Needs Assistance Bed Mobility: Supine to Sit;Sit to Supine     Supine to sit: Min assist Sit to supine: Min assist   General bed mobility comments: assist for L LE, pt requires increased time, effortful movements  Transfers Overall transfer level: Needs assistance   Transfers: Sit to/from Stand Sit to Stand: Mod assist         General transfer comment: attempted to stand pivot to [facility] however pt unable to shift weight, mod assist to rise and steady, pt utilized armrests on [facility] for steadying  assist, attempted twice  Ambulation/Gait                Stairs            Wheelchair Mobility    Modified Rankin (Stroke Patients Only)       Balance Overall balance assessment: Needs assistance;History of Falls Sitting-balance support: No upper extremity supported;Feet supported Sitting balance-Leahy Scale: Fair                                       Pertinent Vitals/Pain Pain Assessment: Faces Faces Pain Scale: Hurts even more Pain Location: L LE Pain Descriptors / Indicators: Discomfort;Tender Pain Intervention(s): Monitored during session;Limited activity within patient's tolerance    Home Living Family/patient expects to be discharged to:: Assisted living                 Additional Comments: pt reports respite care at [facility] however also mentions [facility]?    Prior Function Level of Independence: Needs assistance   Gait / Transfers Assistance Needed: uses w/c for mobility, typically able to transfer stand pivot from bed to w/c with supervision at facility per pt; has not ambulated since cellulitis last month           Hand Dominance        Extremity/Trunk Assessment        Lower Extremity Assessment Lower Extremity Assessment: LLE deficits/detail;Generalized weakness LLE Deficits / Details: L LE weaker then R LE functionally  per observation, pt tender to touch and required assist, L LE also with edema, pt reports "bad" left knee as well       Communication   Communication: Expressive difficulties  Cognition Arousal/Alertness: Awake/alert Behavior During Therapy: WFL for tasks assessed/performed Overall Cognitive Status: History of cognitive impairments - at baseline                                 General Comments: likely at baseline however no family present      General Comments      Exercises     Assessment/Plan    PT Assessment Patient needs continued PT services  PT Problem List  Decreased strength;Decreased mobility;Decreased activity tolerance;Decreased balance;Decreased knowledge of use of DME       PT Treatment Interventions DME instruction;Functional mobility training;Therapeutic exercise;Neuromuscular re-education;Wheelchair mobility training;Therapeutic activities;Balance training;Patient/family education    PT Goals (Current goals can be found in the Care Plan section)  Acute Rehab PT Goals PT Goal Formulation: With patient Time For Goal Achievement: 02/27/18 Potential to Achieve Goals: Good    Frequency Min 2X/week   Barriers to discharge        Co-evaluation               AM-PAC PT "6 Clicks" Mobility  Outcome Measure Help needed turning from your back to your side while in a flat bed without using bedrails?: A Little Help needed moving from lying on your back to sitting on the side of a flat bed without using bedrails?: A Lot Help needed moving to and from a bed to a chair (including a wheelchair)?: Total Help needed standing up from a chair using your arms (e.g., wheelchair or bedside chair)?: Total Help needed to walk in hospital room?: Total Help needed climbing 3-5 steps with a railing? : Total 6 Click Score: 9    End of Session Equipment Utilized During Treatment: Gait belt Activity Tolerance: Patient tolerated treatment well Patient left: in bed;with call bell/phone within reach;with bed alarm set Nurse Communication: Mobility status PT Visit Diagnosis: Muscle weakness (generalized) (M62.81)    Time: 6770-3403 PT Time Calculation (min) (ACUTE ONLY): 28 min   Charges:   PT Evaluation $PT Eval Moderate Complexity: 1 Mod PT Treatments $Therapeutic Activity: 8-22 mins       Carmelia Bake, PT, DPT Acute Rehabilitation Services Office: [phone] Pager: [phone]  Trena Platt 02/13/2018, 12:10 PM

## 2018-02-13 NOTE — Consult Note (Addendum)
WOC Consult requested for left leg.  Assessed left leg and foot and there are no open wounds, drainage, or erythremia requiring topical treatment.  Pt is familiar to [facility] team from previous admission on 12/24, when she was noted to have a full thickness wound to her left ankle.  This has healed and there is no further role for the [place]. Please re-consult if further assistance is needed.  Thank-you,  Julien Girt MSN, [place], [place], [place], [place]

## 2018-02-13 NOTE — Evaluation (Signed)
Occupational Therapy Evaluation Patient Details Name: Susan [name] MRN: [id number] DOB: 1940-01-17 Today's Date: 02/13/2018    History of Present Illness 78 y.o. female with medical history significant of prior CVA, hypertension, hyperlipidemia, paroxysmal A. fib on chronic anticoagulation with Eliquis, spinal stenosis, vascular dementia, chronic back pain who presents to the hospital with chief complaint of having a fall as well as left lower extremity pain and admitted for recurrent left lower extremity cellulitis   Clinical Impression   Pt admitted with the above.   Pt currently with functional limitations due to the deficits listed below (see OT Problem List).  Pt will benefit from skilled OT to increase their safety and independence with ADL and functional mobility for ADL to facilitate discharge to venue listed below. Pt will need increased A at home based on chart review . Will likely need ST SNF     Follow Up Recommendations  SNF    Equipment Recommendations  None recommended by OT    Recommendations for Other Services       Precautions / Restrictions Precautions Precautions: Fall Precaution Comments: dysarthric speech, word finding difficulty      Mobility Bed Mobility Overal bed mobility: Needs Assistance Bed Mobility: Supine to Sit;Sit to Supine     Supine to sit: Mod assist Sit to supine: Mod assist   General bed mobility comments: assist for L LE, pt requires increased time, effortful movements  Transfers Overall transfer level: Needs assistance   Transfers: Sit to/from Stand Sit to Stand: Mod assist         General transfer comment: did not perform a transfer    Balance Overall balance assessment: Needs assistance;History of Falls Sitting-balance support: No upper extremity supported;Feet supported Sitting balance-Leahy Scale: Fair                                     ADL either performed or assessed with clinical judgement   ADL  Overall ADL's : Needs assistance/impaired     Grooming: Minimal assistance;Sitting   Upper Body Bathing: Moderate assistance;Sitting   Lower Body Bathing: Maximal assistance;Sitting/lateral leans;Cueing for sequencing;Cueing for safety   Upper Body Dressing : Minimal assistance;Sitting   Lower Body Dressing: Maximal assistance                 General ADL Comments: Pt sat EOB with OT and performed grooming task.  Pt VERY sleepy. OT did share this with RN     Vision Patient Visual Report: No change from baseline              Pertinent Vitals/Pain Pain Assessment: Faces Faces Pain Scale: Hurts little more Pain Location: L LE Pain Descriptors / Indicators: Discomfort;Tender Pain Intervention(s): Limited activity within patient's tolerance;Repositioned;Monitored during session     Hand Dominance     Extremity/Trunk Assessment Upper Extremity Assessment Upper Extremity Assessment: Generalized weakness(RUE weaker than LUE with fxal testing)   Lower Extremity Assessment Lower Extremity Assessment: LLE deficits/detail;Generalized weakness LLE Deficits / Details: L LE weaker then R LE functionally per observation, pt tender to touch and required assist, L LE also with edema, pt reports "bad" left knee as well       Communication Communication Communication: Expressive difficulties   Cognition Arousal/Alertness: Awake/alert Behavior During Therapy: WFL for tasks assessed/performed Overall Cognitive Status: History of cognitive impairments - at baseline  General Comments: likely at baseline however no family present   General [place] expects to be discharged to:: Assisted living                                 Additional Comments: pt reports respite care at [facility] however also mentions [place]?      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 (per last admission)          OT Problem List: Decreased strength;Decreased activity tolerance;Impaired balance (sitting and/or standing);Decreased safety awareness;Decreased knowledge of use of DME or AE      OT Treatment/Interventions: Self-care/ADL training;Patient/family education;Therapeutic activities;Therapeutic exercise;DME and/or AE instruction    OT Goals(Current goals can be found in the care plan section) Acute Rehab OT Goals Patient Stated Goal: did not state OT Goal Formulation: With patient Time For Goal Achievement: 02/27/18 Potential to Achieve Goals: Good  OT Frequency: Min 2X/week   Barriers to D/C:               AM-PAC OT "6 Clicks" Daily Activity     Outcome Measure Help from another person eating meals?: A Little Help from another person taking care of personal grooming?: A Little Help from another person toileting, which includes using toliet, bedpan, or urinal?: Total Help from another person bathing (including washing, rinsing, drying)?: A Lot Help from another person to put on and taking off regular upper body clothing?: A Lot Help from another person to put on and taking off regular lower body clothing?: Total 6 Click Score: 12   End of Session Nurse Communication: Mobility status  Activity Tolerance: Patient limited by fatigue Patient left: in bed;with call bell/phone within reach;with bed alarm set  OT Visit Diagnosis: Unsteadiness on feet (R26.81);Muscle weakness (generalized) (M62.81);Other abnormalities of gait and mobility (R26.89)                Time: 1120-1140 OT Time Calculation (min): 20 min Charges:  OT General Charges $OT Visit: 1 Visit OT  Evaluation $OT Eval Moderate Complexity: 1 Mod  Kari Baars, [place] [phone] Office- [phone]     Aleesa Sweigert, Edwena Felty D 02/13/2018, 1:30 PM

## 2018-02-13 NOTE — Progress Notes (Addendum)
[facility] PROGRESS NOTE  [name] BLT:[id number] DOB: 11/24/1940 DOA: 02/12/2018   PCP: Lauree Chandler, NP     Assessment/Plan: Principal Problem:   Cellulitis Active Problems:   TIA (transient ischemic attack)   History of CVA (cerebrovascular accident)   HTN (hypertension)   Susan [name] is a 78 y.o. female with medical history significant of prior CVA, hypertension, hyperlipidemia, paroxysmal A. fib on chronic anticoagulation with Eliquis, spinal stenosis, who presents to the hospital with chief complaint of having a fall as well as left lower extremity pain.  Patient has been dealing with left lower extremity cellulitis for the past 2 months.  She was hospitalized twice in December 2019, received a course of intravenous antibiotics in the hospital, she was transitioned to p.o., discharged and upon finishing up the p.o. antibiotics for cellulitis recurred.   she has now been off her antibiotics for the past 3 to 4 weeks.  For the last week, she has noticed increased redness and swelling of the left lower extremity, as well as increased pain.   Assessment and plan Left lower extremity cellulitis -This is seem to be recurrent, this is her third episode in the last 2 months.  She seems to be improving with IV antibiotics and then transition to p.o. however 1 to 2 weeks after antibiotics are stopped it seems like it recurs.  She is quite tender to palpation as well as red and warm,  Continue  IV antibiotics with vancomycin and ceftriaxone. Patient currently has no open wounds. Her  full thickness wound from December has completely healed.  Patient had a CT of the right leg on 12/29/17 , which showed subcutaneous edema consistent  With recurrent cellulitis. ABI of the legs shows moderate PAD in right >left , venous Doppler to rule out DVT, venous insufficiency . Given complaints of knee pain  We'll check uric acid for completeness. Continue iv antibiotics for at least another  24 hours , then switch to oral.   Slurred speech/word finding difficulties, with concern for TIA versus CVA -This does appear to be somewhat subacute,   MRI of the brain, did not show any acute intracranial abnormality, mild chronic microvascular ischemic changes of the brain  .Continue Eliquis for her history of paroxysmal A. Fib   Paroxysmal A. fib -Monitor on telemetry, continue home medications along with Eliquis  Chronic kidney disease stage III -Baseline creatinine ranging 1.2-1.4, currently at baseline  Chronic systolic CHF -Most recent echo done in 2017 showed recovered EF to 55 to 60%, from prior values of 35%. She had a prior dilated cardiomyopathy.  She does not look fluid overloaded other than chronic bilateral lower extremity swelling -Continue Lasix  Mild dementia -Continue Aricept  Hypertension -Continue home medications,   Severe osteoarthritis -Especially in her left knee, followed as an outpatient PT OT evaluation   DVT prophylaxsis  eliquis  Code Status:  full   Family Communication: Discussed in detail with the patient, all imaging results, lab results explained to the patient   Disposition Plan:  24-48 hrs      Consultants:  none  Procedures:  None   Antibiotics: Anti-infectives (From admission, onward)   Start     Dose/Rate Route Frequency Ordered Stop   02/14/18 0700  vancomycin (VANCOCIN) 1,500 mg in sodium chloride 0.9 % 500 mL IVPB     1,500 mg 250 mL/hr over 120 Minutes Intravenous Every 36 hours 02/12/18 1735  02/12/18 1930  cefTRIAXone (ROCEPHIN) 1 g in sodium chloride 0.9 % 100 mL IVPB     1 g 200 mL/hr over 30 Minutes Intravenous Every 24 hours 02/12/18 1836     02/12/18 1615  vancomycin (VANCOCIN) 1,500 mg in sodium chloride 0.9 % 500 mL IVPB     1,500 mg 250 mL/hr over 120 Minutes Intravenous  Once 02/12/18 1608           HPI/Subjective: Patient states that the ulcer on her left fourth has completely healed,  noted to have a low-grade fever, and left leg is tender to touch below the knee  Objective: Vitals:   02/13/18 0220 02/13/18 0404 02/13/18 0539 02/13/18 0813  BP: (!) 163/80 (!) 168/75 140/79 (!) 162/95  Pulse: 88 91 95 88  Resp: 15 17 18 18   Temp: 99.4 F (37.4 C) 100 F (37.8 C) 99.6 F (37.6 C) 99.1 F (37.3 C)  TempSrc: Oral Oral Oral Oral  SpO2: 93% 94% 98% 97%  Weight:      Height:        Intake/Output Summary (Last 24 hours) at 02/13/2018 1004 Last data filed at 02/13/2018 0813 Gross per 24 hour  Intake 100 ml  Output -  Net 100 ml    Exam:  Examination:  General exam: Appears calm and comfortable  Respiratory system: Clear to auscultation. Respiratory effort normal. Cardiovascular system: S1 & S2 heard, RRR. No JVD, murmurs, rubs, gallops or clicks. No pedal edema. Gastrointestinal system: Abdomen is nondistended, soft and nontender. No organomegaly or masses felt. Normal bowel sounds heard. Central nervous system: Alert and oriented. No focal neurological deficits. Extremities: Symmetric 5 x 5 power. Skin: increased erythema and warmth of the left lower extremity Psychiatry: Judgement and insight appear normal. Mood & affect appropriate.     Data Reviewed: I have personally reviewed following labs and imaging studies  Micro Results No results found for this or any previous visit (from the past 240 hour(s)).  Radiology Reports Dg Lumbar Spine 2-3 Views  Result Date: 02/12/2018 CLINICAL DATA:  Pain following fall EXAM: LUMBAR SPINE - 2-3 VIEW COMPARISON:  Lumbar MRI Jun 07, 2014 FINDINGS: Frontal, lateral, and spot lumbosacral lateral images were obtained. There are 5 non-rib-bearing lumbar type vertebral bodies. There is no acute fracture. There is 5 mm of anterolisthesis of L4 on L5, stable. There is minimal anterolisthesis of L3 on L4, stable. No new spondylolisthesis. There is marked disc space narrowing at L5-S1. There is moderate disc space narrowing at L3-4  and L4-5 with vacuum phenomenon at these levels. No erosive changes. There is facet osteoarthritic change at L3-4, L4-5, and L5-S1 bilaterally. There is aortic atherosclerosis. There are foci of mesh in the lower abdomen/upper pelvic wall regions. IMPRESSION: Osteoarthritic change at L3-4, L4-5, and L5-S1, most severe at L5-S1. spondylolisthesis at L4-5 and to a lesser extent at L3-4 appears stable. No fracture evident. There is aortic atherosclerosis. Electronically Signed   By: Lowella Grip III M.D.   On: 02/12/2018 13:31   Ct Head Wo Contrast  Result Date: 02/12/2018 CLINICAL DATA:  Head trauma, anticoagulation, fall from wheelchair EXAM: CT HEAD WITHOUT CONTRAST; CT CERVICAL SPINE WITHOUT CONTRAST TECHNIQUE: Contiguous axial images were obtained from the base of the skull through the vertex without intravenous contrast. COMPARISON:  12/21/2017 FINDINGS: CT HEAD FINDINGS Brain: No evidence of acute infarction, hemorrhage, hydrocephalus, extra-axial collection or mass lesion/mass effect. Periventricular white matter hypodensity. Vascular: No hyperdense vessel or unexpected calcification. Skull: Normal. Negative for  fracture or focal lesion. Sinuses/Orbits: No acute finding. Other: None. CT CERVICAL SPINE FINDINGS Alignment: Degenerative straightening of the normal cervical lordosis. Skull base and vertebrae: No acute fracture. No primary bone lesion or focal pathologic process. There is a right hemi laminectomy of C1 versus congenital ossification variant. Soft tissues and spinal canal: No prevertebral fluid or swelling. No visible canal hematoma. Disc levels: Severe multilevel disc degenerative disease of the cervical spine. Upper chest: Negative. Other: None. IMPRESSION: 1. No acute intracranial pathology. Small-vessel white matter disease. 2. No fracture or static subluxation of the cervical spine. Severe multilevel disc degenerative disease. Electronically Signed   By: Eddie Candle M.D.   On:  02/12/2018 13:18   Ct Cervical Spine Wo Contrast  Result Date: 02/12/2018 CLINICAL DATA:  Head trauma, anticoagulation, fall from wheelchair EXAM: CT HEAD WITHOUT CONTRAST; CT CERVICAL SPINE WITHOUT CONTRAST TECHNIQUE: Contiguous axial images were obtained from the base of the skull through the vertex without intravenous contrast. COMPARISON:  12/21/2017 FINDINGS: CT HEAD FINDINGS Brain: No evidence of acute infarction, hemorrhage, hydrocephalus, extra-axial collection or mass lesion/mass effect. Periventricular white matter hypodensity. Vascular: No hyperdense vessel or unexpected calcification. Skull: Normal. Negative for fracture or focal lesion. Sinuses/Orbits: No acute finding. Other: None. CT CERVICAL SPINE FINDINGS Alignment: Degenerative straightening of the normal cervical lordosis. Skull base and vertebrae: No acute fracture. No primary bone lesion or focal pathologic process. There is a right hemi laminectomy of C1 versus congenital ossification variant. Soft tissues and spinal canal: No prevertebral fluid or swelling. No visible canal hematoma. Disc levels: Severe multilevel disc degenerative disease of the cervical spine. Upper chest: Negative. Other: None. IMPRESSION: 1. No acute intracranial pathology. Small-vessel white matter disease. 2. No fracture or static subluxation of the cervical spine. Severe multilevel disc degenerative disease. Electronically Signed   By: Eddie Candle M.D.   On: 02/12/2018 13:18   Mr Brain Wo Contrast  Result Date: 02/12/2018 CLINICAL DATA:  78 y/o F; unwitnessed fall. TIA, initial exam. 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 EXAM: MRI HEAD WITHOUT CONTRAST MRA HEAD WITHOUT CONTRAST TECHNIQUE: Multiplanar, multiecho pulse sequences of the brain and surrounding structures were obtained without intravenous contrast. Angiographic images of the head were obtained using MRA technique without contrast. COMPARISON:  02/12/2018 CT head.   01/25/2015 MRI and MRA head. FINDINGS: MRI HEAD FINDINGS Brain: No acute infarction, hemorrhage, hydrocephalus, extra-axial collection or mass lesion. Small chronic lacunar infarcts are present within the left thalamus and posterior limb of internal capsule. Punctate subcortical and early confluent periventricular nonspecific T2 FLAIR hyperintensities are compatible with mild chronic microvascular ischemic changes for age. Advanced volume loss of the brain with prominent asymmetric volume loss in the bilateral parietal lobes and anterior hippocampi. Vascular: Normal flow voids. Skull and upper cervical spine: Normal marrow signal. Sinuses/Orbits: Mild diffuse paranasal sinus mucosal thickening. No abnormal signal of the mastoid air cells. Bilateral intra-ocular lens replacement. Other: None. MRA HEAD FINDINGS Internal carotid arteries: Patent. Small left ICA due to variant ACA anatomy. Anterior cerebral arteries: Patent. Large left A1, large anterior communicating artery, hypoplastic left A1, normal variant. Middle cerebral arteries: Patent.  Mild distal left P1 stenosis. Anterior communicating artery: Patent. Posterior communicating arteries: Patent right. No left identified, likely hypoplastic or absent. Posterior cerebral arteries:  Patent.  Mild right P2 stenosis. Basilar artery:  Patent. Vertebral arteries:  Patent. Previously described segments of high-grade stenosis are not visible on the current study, likely due to artifact on the prior MRA of  the head. No evidence of high-grade stenosis, large vessel occlusion, or aneurysm. IMPRESSION: 1. No acute intracranial abnormality. 2. Mild for age chronic microvascular ischemic changes of the brain. Advanced volume loss of the brain with asymmetric parietal and anterior hippocampal volume loss consistent with history of Alzheimer's disease. 3. Patent anterior and posterior intracranial circulation. No large vessel occlusion, aneurysm, or significant stenosis is  identified. Electronically Signed   By: Kristine Garbe M.D.   On: 02/12/2018 18:30   Mr Jodene Nam Head Wo Contrast  Result Date: 02/12/2018 CLINICAL DATA:  78 y/o F; unwitnessed fall. TIA, initial exam. 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 EXAM: MRI HEAD WITHOUT CONTRAST MRA HEAD WITHOUT CONTRAST TECHNIQUE: Multiplanar, multiecho pulse sequences of the brain and surrounding structures were obtained without intravenous contrast. Angiographic images of the head were obtained using MRA technique without contrast. COMPARISON:  02/12/2018 CT head.  01/25/2015 MRI and MRA head. FINDINGS: MRI HEAD FINDINGS Brain: No acute infarction, hemorrhage, hydrocephalus, extra-axial collection or mass lesion. Small chronic lacunar infarcts are present within the left thalamus and posterior limb of internal capsule. Punctate subcortical and early confluent periventricular nonspecific T2 FLAIR hyperintensities are compatible with mild chronic microvascular ischemic changes for age. Advanced volume loss of the brain with prominent asymmetric volume loss in the bilateral parietal lobes and anterior hippocampi. Vascular: Normal flow voids. Skull and upper cervical spine: Normal marrow signal. Sinuses/Orbits: Mild diffuse paranasal sinus mucosal thickening. No abnormal signal of the mastoid air cells. Bilateral intra-ocular lens replacement. Other: None. MRA HEAD FINDINGS Internal carotid arteries: Patent. Small left ICA due to variant ACA anatomy. Anterior cerebral arteries: Patent. Large left A1, large anterior communicating artery, hypoplastic left A1, normal variant. Middle cerebral arteries: Patent.  Mild distal left P1 stenosis. Anterior communicating artery: Patent. Posterior communicating arteries: Patent right. No left identified, likely hypoplastic or absent. Posterior cerebral arteries:  Patent.  Mild right P2 stenosis. Basilar artery:  Patent. Vertebral arteries:  Patent. Previously  described segments of high-grade stenosis are not visible on the current study, likely due to artifact on the prior MRA of the head. No evidence of high-grade stenosis, large vessel occlusion, or aneurysm. IMPRESSION: 1. No acute intracranial abnormality. 2. Mild for age chronic microvascular ischemic changes of the brain. Advanced volume loss of the brain with asymmetric parietal and anterior hippocampal volume loss consistent with history of Alzheimer's disease. 3. Patent anterior and posterior intracranial circulation. No large vessel occlusion, aneurysm, or significant stenosis is identified. Electronically Signed   By: Kristine Garbe M.D.   On: 02/12/2018 18:30     CBC Recent Labs  Lab 02/12/18 1246  WBC 4.1  HGB 10.1*  HCT 32.9*  PLT 245  MCV 96.5  MCH 29.6  MCHC 30.7  RDW 15.6*  LYMPHSABS 0.8  MONOABS 0.6  EOSABS 0.1  BASOSABS 0.0    Chemistries  Recent Labs  Lab 02/06/18 1429 02/12/18 1246  NA 141 139  K 4.2 3.8  CL 106 104  CO2 27 27  GLUCOSE 123 99  BUN 21 21  CREATININE 1.43* 1.38*  CALCIUM 10.7* 10.0  AST 27  --   ALT 15  --   BILITOT 0.4  --    ------------------------------------------------------------------------------------------------------------------ estimated creatinine clearance is 31.8 mL/min (A) (by C-G formula based on SCr of 1.38 mg/dL (H)). ------------------------------------------------------------------------------------------------------------------ Recent Labs    02/13/18 0423  HGBA1C 5.9*   ------------------------------------------------------------------------------------------------------------------ Recent Labs    02/13/18 0423  CHOL 179  HDL 63  LDLCALC  103*  TRIG 65  CHOLHDL 2.8   ------------------------------------------------------------------------------------------------------------------ No results for input(s): TSH, T4TOTAL, T3FREE, THYROIDAB in the last 72 hours.  Invalid input(s):  FREET3 ------------------------------------------------------------------------------------------------------------------ No results for input(s): VITAMINB12, FOLATE, FERRITIN, TIBC, IRON, RETICCTPCT in the last 72 hours.  Coagulation profile No results for input(s): INR, PROTIME in the last 168 hours.  No results for input(s): DDIMER in the last 72 hours.  Cardiac Enzymes No results for input(s): CKMB, TROPONINI, MYOGLOBIN in the last 168 hours.  Invalid input(s): CK ------------------------------------------------------------------------------------------------------------------ Invalid input(s): POCBNP   CBG: No results for input(s): GLUCAP in the last 168 hours.     Studies: Dg Lumbar Spine 2-3 Views  Result Date: 02/12/2018 CLINICAL DATA:  Pain following fall EXAM: LUMBAR SPINE - 2-3 VIEW COMPARISON:  Lumbar MRI Jun 07, 2014 FINDINGS: Frontal, lateral, and spot lumbosacral lateral images were obtained. There are 5 non-rib-bearing lumbar type vertebral bodies. There is no acute fracture. There is 5 mm of anterolisthesis of L4 on L5, stable. There is minimal anterolisthesis of L3 on L4, stable. No new spondylolisthesis. There is marked disc space narrowing at L5-S1. There is moderate disc space narrowing at L3-4 and L4-5 with vacuum phenomenon at these levels. No erosive changes. There is facet osteoarthritic change at L3-4, L4-5, and L5-S1 bilaterally. There is aortic atherosclerosis. There are foci of mesh in the lower abdomen/upper pelvic wall regions. IMPRESSION: Osteoarthritic change at L3-4, L4-5, and L5-S1, most severe at L5-S1. spondylolisthesis at L4-5 and to a lesser extent at L3-4 appears stable. No fracture evident. There is aortic atherosclerosis. Electronically Signed   By: Lowella Grip III M.D.   On: 02/12/2018 13:31   Ct Head Wo Contrast  Result Date: 02/12/2018 CLINICAL DATA:  Head trauma, anticoagulation, fall from wheelchair EXAM: CT HEAD WITHOUT CONTRAST; CT  CERVICAL SPINE WITHOUT CONTRAST TECHNIQUE: Contiguous axial images were obtained from the base of the skull through the vertex without intravenous contrast. COMPARISON:  12/21/2017 FINDINGS: CT HEAD FINDINGS Brain: No evidence of acute infarction, hemorrhage, hydrocephalus, extra-axial collection or mass lesion/mass effect. Periventricular white matter hypodensity. Vascular: No hyperdense vessel or unexpected calcification. Skull: Normal. Negative for fracture or focal lesion. Sinuses/Orbits: No acute finding. Other: None. CT CERVICAL SPINE FINDINGS Alignment: Degenerative straightening of the normal cervical lordosis. Skull base and vertebrae: No acute fracture. No primary bone lesion or focal pathologic process. There is a right hemi laminectomy of C1 versus congenital ossification variant. Soft tissues and spinal canal: No prevertebral fluid or swelling. No visible canal hematoma. Disc levels: Severe multilevel disc degenerative disease of the cervical spine. Upper chest: Negative. Other: None. IMPRESSION: 1. No acute intracranial pathology. Small-vessel white matter disease. 2. No fracture or static subluxation of the cervical spine. Severe multilevel disc degenerative disease. Electronically Signed   By: Eddie Candle M.D.   On: 02/12/2018 13:18   Ct Cervical Spine Wo Contrast  Result Date: 02/12/2018 CLINICAL DATA:  Head trauma, anticoagulation, fall from wheelchair EXAM: CT HEAD WITHOUT CONTRAST; CT CERVICAL SPINE WITHOUT CONTRAST TECHNIQUE: Contiguous axial images were obtained from the base of the skull through the vertex without intravenous contrast. COMPARISON:  12/21/2017 FINDINGS: CT HEAD FINDINGS Brain: No evidence of acute infarction, hemorrhage, hydrocephalus, extra-axial collection or mass lesion/mass effect. Periventricular white matter hypodensity. Vascular: No hyperdense vessel or unexpected calcification. Skull: Normal. Negative for fracture or focal lesion. Sinuses/Orbits: No acute finding.  Other: None. CT CERVICAL SPINE FINDINGS Alignment: Degenerative straightening of the normal cervical lordosis. Skull base and vertebrae: No acute fracture.  No primary bone lesion or focal pathologic process. There is a right hemi laminectomy of C1 versus congenital ossification variant. Soft tissues and spinal canal: No prevertebral fluid or swelling. No visible canal hematoma. Disc levels: Severe multilevel disc degenerative disease of the cervical spine. Upper chest: Negative. Other: None. IMPRESSION: 1. No acute intracranial pathology. Small-vessel white matter disease. 2. No fracture or static subluxation of the cervical spine. Severe multilevel disc degenerative disease. Electronically Signed   By: Eddie Candle M.D.   On: 02/12/2018 13:18   Mr Brain Wo Contrast  Result Date: 02/12/2018 CLINICAL DATA:  78 y/o F; unwitnessed fall. TIA, initial exam. 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 EXAM: MRI HEAD WITHOUT CONTRAST MRA HEAD WITHOUT CONTRAST TECHNIQUE: Multiplanar, multiecho pulse sequences of the brain and surrounding structures were obtained without intravenous contrast. Angiographic images of the head were obtained using MRA technique without contrast. COMPARISON:  02/12/2018 CT head.  01/25/2015 MRI and MRA head. FINDINGS: MRI HEAD FINDINGS Brain: No acute infarction, hemorrhage, hydrocephalus, extra-axial collection or mass lesion. Small chronic lacunar infarcts are present within the left thalamus and posterior limb of internal capsule. Punctate subcortical and early confluent periventricular nonspecific T2 FLAIR hyperintensities are compatible with mild chronic microvascular ischemic changes for age. Advanced volume loss of the brain with prominent asymmetric volume loss in the bilateral parietal lobes and anterior hippocampi. Vascular: Normal flow voids. Skull and upper cervical spine: Normal marrow signal. Sinuses/Orbits: Mild diffuse paranasal sinus mucosal thickening.  No abnormal signal of the mastoid air cells. Bilateral intra-ocular lens replacement. Other: None. MRA HEAD FINDINGS Internal carotid arteries: Patent. Small left ICA due to variant ACA anatomy. Anterior cerebral arteries: Patent. Large left A1, large anterior communicating artery, hypoplastic left A1, normal variant. Middle cerebral arteries: Patent.  Mild distal left P1 stenosis. Anterior communicating artery: Patent. Posterior communicating arteries: Patent right. No left identified, likely hypoplastic or absent. Posterior cerebral arteries:  Patent.  Mild right P2 stenosis. Basilar artery:  Patent. Vertebral arteries:  Patent. Previously described segments of high-grade stenosis are not visible on the current study, likely due to artifact on the prior MRA of the head. No evidence of high-grade stenosis, large vessel occlusion, or aneurysm. IMPRESSION: 1. No acute intracranial abnormality. 2. Mild for age chronic microvascular ischemic changes of the brain. Advanced volume loss of the brain with asymmetric parietal and anterior hippocampal volume loss consistent with history of Alzheimer's disease. 3. Patent anterior and posterior intracranial circulation. No large vessel occlusion, aneurysm, or significant stenosis is identified. Electronically Signed   By: Kristine Garbe M.D.   On: 02/12/2018 18:30   Mr Jodene Nam Head Wo Contrast  Result Date: 02/12/2018 CLINICAL DATA:  78 y/o F; unwitnessed fall. TIA, initial exam. 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 EXAM: MRI HEAD WITHOUT CONTRAST MRA HEAD WITHOUT CONTRAST TECHNIQUE: Multiplanar, multiecho pulse sequences of the brain and surrounding structures were obtained without intravenous contrast. Angiographic images of the head were obtained using MRA technique without contrast. COMPARISON:  02/12/2018 CT head.  01/25/2015 MRI and MRA head. FINDINGS: MRI HEAD FINDINGS Brain: No acute infarction, hemorrhage, hydrocephalus,  extra-axial collection or mass lesion. Small chronic lacunar infarcts are present within the left thalamus and posterior limb of internal capsule. Punctate subcortical and early confluent periventricular nonspecific T2 FLAIR hyperintensities are compatible with mild chronic microvascular ischemic changes for age. Advanced volume loss of the brain with prominent asymmetric volume loss in the bilateral parietal lobes and anterior hippocampi. Vascular: Normal  flow voids. Skull and upper cervical spine: Normal marrow signal. Sinuses/Orbits: Mild diffuse paranasal sinus mucosal thickening. No abnormal signal of the mastoid air cells. Bilateral intra-ocular lens replacement. Other: None. MRA HEAD FINDINGS Internal carotid arteries: Patent. Small left ICA due to variant ACA anatomy. Anterior cerebral arteries: Patent. Large left A1, large anterior communicating artery, hypoplastic left A1, normal variant. Middle cerebral arteries: Patent.  Mild distal left P1 stenosis. Anterior communicating artery: Patent. Posterior communicating arteries: Patent right. No left identified, likely hypoplastic or absent. Posterior cerebral arteries:  Patent.  Mild right P2 stenosis. Basilar artery:  Patent. Vertebral arteries:  Patent. Previously described segments of high-grade stenosis are not visible on the current study, likely due to artifact on the prior MRA of the head. No evidence of high-grade stenosis, large vessel occlusion, or aneurysm. IMPRESSION: 1. No acute intracranial abnormality. 2. Mild for age chronic microvascular ischemic changes of the brain. Advanced volume loss of the brain with asymmetric parietal and anterior hippocampal volume loss consistent with history of Alzheimer's disease. 3. Patent anterior and posterior intracranial circulation. No large vessel occlusion, aneurysm, or significant stenosis is identified. Electronically Signed   By: Kristine Garbe M.D.   On: 02/12/2018 18:30      Lab Results   Component Value Date   HGBA1C 5.9 (H) 02/13/2018   HGBA1C 5.5 12/21/2017   HGBA1C 5.7 (H) 12/17/2017   Lab Results  Component Value Date   LDLCALC 103 (H) 02/13/2018   CREATININE 1.38 (H) 02/12/2018       Scheduled Meds: .  stroke: mapping our early stages of recovery book   Does not apply Once  . apixaban  5 mg Oral BID  . buPROPion  450 mg Oral Daily  . calcium-vitamin D  1 tablet Oral BID  . carvedilol  12.5 mg Oral BID WC  . cholecalciferol  1,000 Units Oral Daily  . diclofenac sodium  2 g Topical QID  . docusate sodium  100 mg Oral Daily  . donepezil  10 mg Oral QHS  . furosemide  20 mg Oral Daily  . gabapentin  300 mg Oral BID  . iron polysaccharides  150 mg Oral Daily  . losartan  50 mg Oral Daily  . multivitamin with minerals  1 tablet Oral Daily  . MUSCLE RUB  1 application Topical TID  . pantoprazole  40 mg Oral Daily  . saccharomyces boulardii  250 mg Oral BID  . sertraline  12.5 mg Oral BID  . vitamin B-12  2,000 mcg Oral Daily   Continuous Infusions: . cefTRIAXone (ROCEPHIN)  IV Stopped (02/12/18 2118)  . vancomycin    . [START ON 02/14/2018] vancomycin       LOS: 0 days    Time spent: >30 MINS    Reyne Dumas  [facility] Pager [phone]. If 7PM-7AM, please contact night-coverage at [email or web address], password [facility] 02/13/2018, 10:04 AM  LOS: 0 days

## 2018-02-14 DIAGNOSIS — L03116 Cellulitis of left lower limb: Principal | ICD-10-CM

## 2018-02-14 DIAGNOSIS — I69322 Dysarthria following cerebral infarction: Secondary | ICD-10-CM

## 2018-02-14 DIAGNOSIS — I10 Essential (primary) hypertension: Secondary | ICD-10-CM

## 2018-02-14 LAB — COMPREHENSIVE METABOLIC PANEL
ALT: 14 U/L (ref 0–44)
AST: 23 U/L (ref 15–41)
Albumin: 3 g/dL — ABNORMAL LOW (ref 3.5–5.0)
Alkaline Phosphatase: 58 U/L (ref 38–126)
Anion gap: 8 (ref 5–15)
BILIRUBIN TOTAL: 0.4 mg/dL (ref 0.3–1.2)
BUN: 19 mg/dL (ref 8–23)
CO2: 26 mmol/L (ref 22–32)
Calcium: 9.2 mg/dL (ref 8.9–10.3)
Chloride: 106 mmol/L (ref 98–111)
Creatinine, Ser: 1.48 mg/dL — ABNORMAL HIGH (ref 0.44–1.00)
GFR calc Af Amer: 38 mL/min — ABNORMAL LOW (ref >60)
GFR, EST NON AFRICAN AMERICAN: 33 mL/min — AB (ref >60)
Glucose, Bld: 124 mg/dL — ABNORMAL HIGH (ref 70–99)
Potassium: 3.7 mmol/L (ref 3.5–5.1)
Sodium: 140 mmol/L (ref 135–145)
TOTAL PROTEIN: 6 g/dL — AB (ref 6.5–8.1)

## 2018-02-14 LAB — CBC
HCT: 30 % — ABNORMAL LOW (ref 36.0–46.0)
Hemoglobin: 9.1 g/dL — ABNORMAL LOW (ref 12.0–15.0)
MCH: 29.4 pg (ref 26.0–34.0)
MCHC: 30.3 g/dL (ref 30.0–36.0)
MCV: 96.8 fL (ref 80.0–100.0)
Platelets: 249 10*3/uL (ref 150–400)
RBC: 3.1 MIL/uL — ABNORMAL LOW (ref 3.87–5.11)
RDW: 15.7 % — ABNORMAL HIGH (ref 11.5–15.5)
WBC: 3.2 10*3/uL — ABNORMAL LOW (ref 4.0–10.5)
nRBC: 0 % (ref 0.0–0.2)

## 2018-02-14 MED ORDER — VANCOMYCIN HCL IN DEXTROSE 1-5 GM/200ML-% IV SOLN
1000.0000 mg | INTRAVENOUS | Status: DC
  Administered 2018-02-15: 1000 mg via INTRAVENOUS
  Filled 2018-02-14: qty 200

## 2018-02-14 MED ORDER — COLCHICINE 0.6 MG PO TABS
0.6000 mg | ORAL_TABLET | Freq: Two times a day (BID) | ORAL | Status: DC
  Administered 2018-02-14 – 2018-02-15 (×2): 0.6 mg via ORAL
  Filled 2018-02-14 (×4): qty 1

## 2018-02-14 NOTE — Evaluation (Signed)
Speech Language Pathology Evaluation Patient Details Name: Susan [name] MRN: [id number] DOB: [birth year] Today's Date: 02/14/2018 Time: 0435-0500 SLP Time Calculation (min) (ACUTE ONLY): 25 min  Problem List:  Patient Active Problem List   Diagnosis Date Noted  . Cellulitis 02/12/2018  . HTN (hypertension) 02/12/2018  . Cellulitis and abscess of leg 12/29/2017  . CKD (chronic kidney disease) stage 3, GFR 30-59 ml/min (HCC) 12/29/2017  . Cellulitis of left lower extremity   . Ankle edema   . Sepsis ([place]) 12/21/2017  . Medication management 10/11/2016  . Hyperlipemia 06/18/2016  . Gait abnormality 06/18/2016  . Major depressive disorder, recurrent episode, severe ([place]) 11/21/2015  . Edema 02/09/2015  . History of CVA (cerebrovascular accident) 02/01/2015  . 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 ([place])   . 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 Chronic low back pain 01/28/2015  . Short-term memory loss 01/28/2015  . Volume depletion 01/28/2015  . Persistent atrial fibrillation 01/26/2015  . DCM (dilated cardiomyopathy) ([place]) 01/26/2015  . TIA (transient ischemic attack) 01/24/2015  . Chronic diastolic (congestive) heart failure ([place])   . Obesity (BMI 30-39.9) 01/27/2013  . Constipation 09/02/2012  . H/O arthroscopy of right knee 08/06/2012  . Anxiety   . Depression   . GERD (gastroesophageal reflux disease)   . Hypertensive heart disease   . OSA (obstructive sleep apnea)   . Osteoarthritis of right knee   . Hyperparathyroidism, primary ([place]) 04/22/2012  . Chronic cough 10/02/2011   Past Medical History:  Past Medical History:  Diagnosis Date  . Anxiety   . Arthritis    "legs, back" (07/29/2012)  . Cellulitis and abscess of leg 12/29/2017  . Chronic diastolic (congestive) heart failure ([place])   . Chronic lower back pain   . Complication of anesthesia    "I have apnea" (07/29/2012)  . 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  Family history of anesthesia complication    "some wake up during OR; some are hard to wake up; some both" (07/29/2012)  . GERD (gastroesophageal reflux disease)   . 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 ([place])   . History of CVA (cerebrovascular accident) 02/01/2015  . History of stomach ulcers 1980's  . Hyperlipidemia   . Hyperparathyroidism, primary ([place]) 04/22/2012  . Hypertensive heart disease   . Incontinent of urine    wears pads  . Major depressive disorder, recurrent episode, severe ([place]) 11/21/2015  . OSA on CPAP   . Osteoarthritis of right knee   . Pedal edema   . Persistent atrial fibrillation    CHADS2VASC score is 7 - on chronic anticoagulation with Apixaban  . Spinal stenosis   . Umbilical hernia    "unrepaired" (07/29/2012)  . Varicose veins    "BLE" (07/29/2012)  . Vascular dementia [facility])    Past Surgical History:  Past Surgical History:  Procedure Laterality Date  . CATARACT EXTRACTION    . CHOLECYSTECTOMY  ~ 2010  . COLONOSCOPY    . IUD REMOVAL  1980's  . PARATHYROIDECTOMY N/A 06/16/2012   Procedure: NECK EXPLORATION AND LEFT SUPERIOR PARATHYROIDECTOMY;  Surgeon: Earnstine Regal, MD;  Location: [facility];  Service: General;  Laterality: N/A;  . 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 TOTAL KNEE ARTHROPLASTY Right 07/29/2012   Procedure: TOTAL KNEE ARTHROPLASTY;  Surgeon: Ninetta Lights, MD;  Location: [facility];  Service: Orthopedics;  Laterality: Right;   HPI:  78 y.o. female with medical history  significant of prior CVA, hypertension, hyperlipidemia, paroxysmal A. fib on chronic anticoagulation with Eliquis, spinal stenosis, vascular dementia, chronic back pain who presents to the hospital with chief complaint of having a fall as well as left lower extremity pain and admitted for recurrent left lower extremity cellulitis   Assessment / Plan / Recommendation Clinical Impression  Patient presents with a mild-mod cognitive-linguistic impairment  which per her daughter, has been worsening since patient had a fall in December. Patient exhibits word-finding errors in conversation but does show some awareness to this, but with difficulty correcting. She does not exhibit overall good awareness of how her impairments impact her daily life, or how long she has been having these difficulties as per her daughter's report. Patient perseverated on words and thoughts during naming tasks and was not able to redirect herself. She would acknowledge some difficulties but would deny at times that she was making an error. Patient would benefit from SNF level speech-language therapy to work on language and cognitive skills to improve her overall safety and ability to communicate with others.     SLP Assessment  SLP Recommendation/Assessment: All further Speech Lanaguage Pathology  needs can be addressed in the next venue of care SLP Visit Diagnosis: Cognitive communication deficit (R41.841)    Follow Up Recommendations  Skilled Nursing facility    Frequency and Duration   N/A        SLP Evaluation Cognition  Overall Cognitive Status: Impaired/Different from baseline Orientation Level: Oriented X4 Attention: Selective;Alternating Selective Attention: Appears intact Alternating Attention: Impaired Alternating Attention Impairment: Verbal complex Memory: Impaired Memory Impairment: Storage deficit;Decreased recall of new information Awareness: Impaired Awareness Impairment: Anticipatory impairment Problem Solving: Impaired Problem Solving Impairment: Verbal complex;Functional basic Executive Function: Writer: Impaired Organizing Impairment: Verbal complex Safety/Judgment: Appears intact       Comprehension  Auditory Comprehension Overall Auditory Comprehension: Appears within functional limits for tasks assessed    Expression Expression Primary Mode of Expression: Verbal Verbal Expression Overall Verbal Expression:  Impaired Level of Generative/Spontaneous Verbalization: Conversation Repetition: No impairment Naming: Impairment Responsive: 76-100% accurate Divergent: 50-74% accurate Verbal Errors: Aware of errors;Perseveration Pragmatics: No impairment Effective Techniques: Open ended questions;Semantic cues Non-Verbal Means of Communication: Not applicable   Oral / Motor  Oral Motor/Sensory Function Overall Oral Motor/Sensory Function: Within functional limits Motor Speech Overall Motor Speech: Appears within functional limits for tasks assessed   Bailey, MA, CCC-SLP 02/14/18 6:07 PM

## 2018-02-14 NOTE — Progress Notes (Addendum)
PROGRESS NOTE  Susan [name] YTK:[id number] DOB: [birth year] DOA: 02/12/2018 PCP: Lauree Chandler, NP  HPI/Recap of past 24 hours: Per admission H&P: Susan [name] a 78 y.o.femalewith medical history significant ofprior CVA, hypertension, hyperlipidemia, paroxysmal A. fib on chronic anticoagulation with Eliquis, spinal stenosis, who presents to the hospital with chief complaint of having a fall as well as left lower extremity pain. Patient has been dealing with left lower extremity cellulitis for the past 2 months. She was hospitalized twice in December 2019, received a course of intravenous antibiotics in the hospital, she was transitioned to p.o., discharged and upon finishing up the p.o. antibiotics for cellulitis recurred. Most recent discharge was at the end of December 2019, she had antibiotics for a few days at her SNF but she has now been off her antibiotics for the past 3 to 4 weeks. For the last week, she has noticed increased redness and swelling of the left lower extremity, as well as increased pain.   Subjective: Seen at bedside doing well.  Denies any fever there is no pain in the lower extremity until it is palpated deeply.  Daughter is at bedside  Assessment/Plan: Principal Problem:   Cellulitis Active Problems:   TIA (transient ischemic attack)   History of CVA (cerebrovascular accident)   HTN (hypertension)  1.  Recurrent left left lower extremity cellulitis.  Patient is on vancomycin and ceftriaxone will continue with that  2.  Elevated uric acid likely gout ,uric acid is 7.2.  I will start her on colchicine  3.  History of CVA with dysarthria.  Patient has increased fall risk she was admitted from a assisted living facility.  PT OT was consulted and they both recommend skilled nursing facility.  Social worker consult has been put in for evaluation for skilled nursing placement  4.  Hypertension uncontrolled continue current management  5.  Acute on chronic  kidney failure stage III.  Patient is close to her baseline creatinine today is 1.43 yesterday was 1.38.  Her baseline is between 1.2 and 1.4 I will add gentle IV hydration and recheck in the morning  6.  History of chronic systolic dysfunction heart failure current EF of 55 to 60% she has had EF as low as 35%  with dilated cardiomyopathy patient is well compensated at this time.  We will continue her IV Lasix  7.  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  Paroxysmal atrial fibrillation we will continue Eliquis   9. severe osteoarthritis.  Patient is being considered for left knee replacement.  But due to her cellulitis it skin be recommended to hold it until she is completely healed  Code Status: Full  Severity of Illness: The appropriate patient status for this patient is INPATIENT. Inpatient status is judged to be reasonable and necessary in order to provide the required intensity of service to ensure the patient's safety. The patient's presenting symptoms, physical exam findings, and initial radiographic and laboratory data in the context of their chronic comorbidities is felt to place them at high risk for further clinical deterioration. Furthermore, it is not anticipated that the patient will be medically stable for discharge from the hospital within 2 midnights of admission. The following factors support the patient status of inpatient.   " The patient's presenting symptoms include cellulitis recurrent of left lower extremity. " The worrisome physical exam findings include cellulitis of the left lower extremity requiring IV antibiotics. " The initial radiographic and laboratory  data are worrisome because of none. " The chronic co-morbidities include CVA increased fall risk at current living arrangement.   * I certify that at the point of admission it is my clinical judgment that the patient will require inpatient hospital care spanning beyond 2 midnights from the  point of admission due to high intensity of service, high risk for further deterioration and high frequency of surveillance required.*    Family Communication: Daughter at bedside Sharee Pimple.  I informed her that the ultrasound does not show any Baker's cyst.  Also there is no DVT on the left leg.  Also physical and occupational therapy is recommended SNF.  Also advised her of the elevated uric acid which indicates gout.  She stated that sometime in the past somebody had suggested she may have gout  Disposition Plan: SNF   Consultants:  Physical therapy  Occupational Therapy  Speech therapy  Procedures:  None  Antimicrobials:  Vancomycin and ceftriaxone  DVT prophylaxis: Eliquis   Objective: Vitals:   02/14/18 0330 02/14/18 0556 02/14/18 0733 02/14/18 1007  BP: (!) 180/77 (!) 160/116 (!) 167/90 (!) 155/71  Pulse: 81 (!) 103  68  Resp: 15 18  14   Temp: 98.3 F (36.8 C) 98.4 F (36.9 C)  97.8 F (36.6 C)  TempSrc: Oral Oral  Oral  SpO2: 98% 96%  98%  Weight:      Height:        Intake/Output Summary (Last 24 hours) at 02/14/2018 1200 Last data filed at 02/14/2018 9622 Gross per 24 hour  Intake 1129.49 ml  Output 850 ml  Net 279.49 ml   Filed Weights   02/12/18 1032 02/12/18 1833  Weight: 90.7 kg 79.9 kg   Body mass index is 32.22 kg/m.  Exam:  . General: 78 y.o. year-old female well developed well nourished in no acute distress.  Alert and oriented x3. . Cardiovascular: Regular rate and rhythm with no rubs or gallops.  No thyromegaly or JVD noted.   Marland Kitchen Respiratory: Clear to auscultation with no wheezes or rales. Good inspiratory effort. . Abdomen: Soft nontender nondistended with normal bowel sounds x4 quadrants. . Musculoskeletal: No lower extremity edema. 2/4 pulses in all 4 extremities. . Skin: No ulcerative lesions noted or rashes,.  The left lower extremity does not show any warmness this increased dark discoloration no tenderness to light palpation but tender  to deeper palpation. Marland Kitchen Psychiatry: Mood is appropriate for condition and setting    Data Reviewed: CBC: Recent Labs  Lab 02/12/18 1246 02/14/18 0242  WBC 4.1 3.2*  NEUTROABS 2.6  --   HGB 10.1* 9.1*  HCT 32.9* 30.0*  MCV 96.5 96.8  PLT 245 297   Basic Metabolic Panel: Recent Labs  Lab 02/12/18 1246 02/14/18 0242  NA 139 140  K 3.8 3.7  CL 104 106  CO2 27 26  GLUCOSE 99 124*  BUN 21 19  CREATININE 1.38* 1.48*  CALCIUM 10.0 9.2   GFR: Estimated Creatinine Clearance: 29.7 mL/min (A) (by C-G formula based on SCr of 1.48 mg/dL (H)). Liver Function Tests: Recent Labs  Lab 02/14/18 0242  AST 23  ALT 14  ALKPHOS 58  BILITOT 0.4  PROT 6.0*  ALBUMIN 3.0*   No results for input(s): LIPASE, AMYLASE in the last 168 hours. No results for input(s): AMMONIA in the last 168 hours. Coagulation Profile: No results for input(s): INR, PROTIME in the last 168 hours. Cardiac Enzymes: No results for input(s): CKTOTAL, CKMB, CKMBINDEX, TROPONINI in the last  168 hours. BNP (last 3 results) No results for input(s): PROBNP in the last 8760 hours. HbA1C: Recent Labs    02/13/18 0423  HGBA1C 5.9*   CBG: No results for input(s): GLUCAP in the last 168 hours. Lipid Profile: Recent Labs    02/13/18 0423  CHOL 179  HDL 63  LDLCALC 103*  TRIG 65  CHOLHDL 2.8   Thyroid Function Tests: No results for input(s): TSH, T4TOTAL, FREET4, T3FREE, THYROIDAB in the last 72 hours. Anemia Panel: No results for input(s): VITAMINB12, FOLATE, FERRITIN, TIBC, IRON, RETICCTPCT in the last 72 hours. Urine analysis:    Component Value Date/Time   COLORURINE YELLOW 02/12/2018 1628   APPEARANCEUR CLEAR 02/12/2018 1628   LABSPEC 1.009 02/12/2018 1628   PHURINE 8.0 02/12/2018 1628   GLUCOSEU NEGATIVE 02/12/2018 1628   HGBUR NEGATIVE 02/12/2018 1628   BILIRUBINUR NEGATIVE 02/12/2018 1628   KETONESUR NEGATIVE 02/12/2018 1628   PROTEINUR NEGATIVE 02/12/2018 1628   UROBILINOGEN 0.2 03/05/2013  1259   NITRITE NEGATIVE 02/12/2018 1628   LEUKOCYTESUR NEGATIVE 02/12/2018 1628   Sepsis Labs: @LABRCNTIP (procalcitonin:4,lacticidven:4)  ) Recent Results (from the past 240 hour(s))  MRSA PCR Screening     Status: None   Collection Time: 02/13/18  8:48 AM  Result Value Ref Range Status   MRSA by PCR NEGATIVE NEGATIVE Final    Comment:        The GeneXpert MRSA Assay (FDA approved for NASAL specimens only), is one component of a comprehensive MRSA colonization surveillance program. It is not intended to diagnose MRSA infection nor to guide or monitor treatment for MRSA infections. Performed at [facility], [place] [address] [place], [place] [address]       Studies: Vas [place] Abi With/wo Tbi  Result Date: 02/13/2018 LOWER EXTREMITY DOPPLER STUDY Indications: Cellulitis.  Performing Technologist: June Leap Rdms, Rvt  Examination Guidelines: A complete evaluation includes at minimum, Doppler waveform signals and systolic blood pressure reading at the level of bilateral brachial, anterior tibial, and posterior tibial arteries, when vessel segments are accessible. Bilateral testing is considered an integral part of a complete examination. Photoelectric Plethysmograph (PPG) waveforms and toe systolic pressure readings are included as required and additional duplex testing as needed. Limited examinations for reoccurring indications may be performed as noted.  ABI Findings: +--------+------------------+-----+----------+--------+ Right   Rt Pressure (mmHg)IndexWaveform  Comment  +--------+------------------+-----+----------+--------+ [id number]                    triphasic          +--------+------------------+-----+----------+--------+ ATA     120               0.77 monophasic         +--------+------------------+-----+----------+--------+ PTA     93                0.60 monophasic         +--------+------------------+-----+----------+--------+  +--------+------------------+-----+----------+-------+ Left    Lt Pressure (mmHg)IndexWaveform  Comment +--------+------------------+-----+----------+-------+ [id number]                    triphasic         +--------+------------------+-----+----------+-------+ ATA     149               0.96 biphasic          +--------+------------------+-----+----------+-------+ PTA     158               1.02 monophasic        +--------+------------------+-----+----------+-------+  Left ABI is borderline mild disease.  Summary: Right: Resting right ankle-brachial index indicates moderate right lower extremity arterial disease. Left: Resting left ankle-brachial index is within normal range.  *See table(s) above for measurements and observations.  Electronically signed by Monica Martinez MD on 02/13/2018 at 3:41:53 PM.   Final    Vas [place] Lower Extremity Venous (dvt)  Result Date: 02/13/2018  Lower Venous Study Indications: Edema/ cellulitis. Other Indications: Multiple cases of cellulitis in past few months. Comparison Study: 12/21/17- negative. Performing Technologist: June Leap RDMS, RVT  Examination Guidelines: A complete evaluation includes B-mode imaging, spectral Doppler, color Doppler, and power Doppler as needed of all accessible portions of each vessel. Bilateral testing is considered an integral part of a complete examination. Limited examinations for reoccurring indications may be performed as noted.  Left Venous Findings: +---------+---------------+---------+-----------+----------+-------------------+          CompressibilityPhasicitySpontaneityPropertiesSummary             +---------+---------------+---------+-----------+----------+-------------------+ CFV      Full           Yes      Yes                                      +---------+---------------+---------+-----------+----------+-------------------+ SFJ      Full                                                              +---------+---------------+---------+-----------+----------+-------------------+ FV Prox  Full                                                             +---------+---------------+---------+-----------+----------+-------------------+ FV Mid   Full                                                             +---------+---------------+---------+-----------+----------+-------------------+ FV DistalFull                                                             +---------+---------------+---------+-----------+----------+-------------------+ PFV      Full                                                             +---------+---------------+---------+-----------+----------+-------------------+ POP      Full           Yes      Yes                                      +---------+---------------+---------+-----------+----------+-------------------+  PTV      Full                                                             +---------+---------------+---------+-----------+----------+-------------------+ PERO     Full                                         not well visualized +---------+---------------+---------+-----------+----------+-------------------+    Summary: Left: There is no evidence of deep vein thrombosis in the lower extremity. No cystic structure found in the popliteal fossa.  *See table(s) above for measurements and observations. Electronically signed by Monica Martinez MD on 02/13/2018 at 3:44:12 PM.    Final     Scheduled Meds: . apixaban  5 mg Oral BID  . buPROPion  450 mg Oral Daily  . calcium-vitamin D  1 tablet Oral BID  . carvedilol  12.5 mg Oral BID WC  . cholecalciferol  1,000 Units Oral Daily  . diclofenac sodium  2 g Topical QID  . docusate sodium  100 mg Oral Daily  . donepezil  10 mg Oral QHS  . furosemide  20 mg Oral Daily  . gabapentin  300 mg Oral BID  . iron polysaccharides  150 mg Oral Daily  . losartan  50 mg Oral Daily  .  multivitamin with minerals  1 tablet Oral Daily  . MUSCLE RUB  1 application Topical TID  . pantoprazole  40 mg Oral Daily  . saccharomyces boulardii  250 mg Oral BID  . sertraline  12.5 mg Oral BID  . vitamin B-12  2,000 mcg Oral Daily    Continuous Infusions: . cefTRIAXone (ROCEPHIN)  IV Stopped (02/13/18 1919)  . [START ON 02/15/2018] vancomycin       LOS: 1 day     Cristal Deer, MD [facility]  To reach me or the doctor on call, go to: [email or web address] Password [email or web address]  02/14/2018, 12:00 PM

## 2018-02-14 NOTE — Plan of Care (Signed)
  Problem: Health Behavior/Discharge Planning: Goal: Ability to manage health-related needs will improve Outcome: Progressing   Problem: Clinical Measurements: Goal: Ability to maintain clinical measurements within normal limits will improve Outcome: Progressing   Problem: Clinical Measurements: Goal: Will remain free from infection Outcome: Progressing   Problem: Clinical Measurements: Goal: Cardiovascular complication will be avoided Outcome: Progressing   Problem: Activity: Goal: Risk for activity intolerance will decrease Outcome: Progressing   Problem: Nutrition: Goal: Adequate nutrition will be maintained Outcome: Progressing   Problem: Elimination: Goal: Will not experience complications related to bowel motility Outcome: Progressing   Problem: Activity: Goal: Risk for activity intolerance will decrease Outcome: Progressing   Problem: Nutrition: Goal: Adequate nutrition will be maintained Outcome: Progressing   Problem: Safety: Goal: Ability to remain free from injury will improve Outcome: Progressing   Problem: Skin Integrity: Goal: Risk for impaired skin integrity will decrease Outcome: Progressing

## 2018-02-14 NOTE — Progress Notes (Signed)
Pharmacy Antibiotic Note  Susan [name] is a 78 y.o. female with extensive past medical history including CVA, hypertension, hyperlipidemia, paroxysmal A. fib on chronic anticoagulation with Eliquis, spinal stenosis, and CKD III who wasadmitted on 02/12/2018 with recurrent LE cellulitis with 2 hospitalizations in December for the same.  Pharmacy has been consulted for vancomycin dosing.  Plan: Adjut Vancomycin 1g q48 hours. AUC 515 IBW/TBW.  Rocephin per MD F/U SCr, culture results, and plans for antiboitics  Height: 5\' 2"  (157.5 cm) Weight: 176 lb 2.4 oz (79.9 kg) IBW/kg (Calculated) : 50.1  Temp (24hrs), Avg:98.5 F (36.9 C), Min:98 F (36.7 C), Max:99.1 F (37.3 C)  Recent Labs  Lab 02/12/18 1246 02/14/18 0242  WBC 4.1 3.2*  CREATININE 1.38* 1.48*    Estimated Creatinine Clearance: 29.7 mL/min (A) (by C-G formula based on SCr of 1.48 mg/dL (H)).    Allergies  Allergen Reactions  . Ace Inhibitors Cough  . Lipitor [Atorvastatin] Swelling  . Simvastatin Other (See Comments)    Myalgias     . Latex Itching    Antimicrobials this admission: 2/6 rocephin >>  2/7 vancomycin >>  Dose adjustments this admission: 2/7 vancomycin 1500mg  q36h 2/8 decrease 1g q48h for rising SCr  Microbiology results: MRSA PCR: neg  Thank you for allowing pharmacy to be a part of this patient's care.  Peggyann Juba, PharmD, BCPS Pager: [phone] 02/14/2018 9:40 AM

## 2018-02-15 LAB — BASIC METABOLIC PANEL
Anion gap: 5 (ref 5–15)
BUN: 22 mg/dL (ref 8–23)
CALCIUM: 9.3 mg/dL (ref 8.9–10.3)
CO2: 29 mmol/L (ref 22–32)
CREATININE: 1.19 mg/dL — AB (ref 0.44–1.00)
Chloride: 105 mmol/L (ref 98–111)
GFR calc Af Amer: 50 mL/min — ABNORMAL LOW (ref >60)
GFR, EST NON AFRICAN AMERICAN: 43 mL/min — AB (ref >60)
Glucose, Bld: 111 mg/dL — ABNORMAL HIGH (ref 70–99)
Potassium: 3.7 mmol/L (ref 3.5–5.1)
Sodium: 139 mmol/L (ref 135–145)

## 2018-02-15 NOTE — Clinical Social Work Note (Signed)
Clinical Social Work Assessment  Patient Details  Name: Susan [name] MRN: [id number] Date of Birth: [birth year]  Date of referral:  02/13/18               Reason for consult:  Facility Placement                Permission sought to share information with:  Facility Sport and exercise psychologist Permission granted to share information::  Yes, Verbal Permission Granted  Name::     Solash Tullo  Agency::  SNF  Relationship::  Daughter  Contact Information:  [phone]  Housing/Transportation Living arrangements for the past 2 months:  [place] of Information:  Patient, Adult Children Patient Interpreter Needed:  None Criminal Activity/Legal Involvement Pertinent to Current Situation/Hospitalization:  No - Comment as needed Significant Relationships:  Adult Children Lives with:  Facility Resident Do you feel safe going back to the place where you live?  Yes Need for family participation in patient care:  Yes (Comment)  Care giving concerns:  Patient presented to hospital from [facility] after fall with left lower extremity pain, left lower extremity cellulitis, and chronic back pain. PT/OT recommending SNF for short term therapy before returning to [facility].   Social Worker assessment / plan:  CSW met with patient and daughter, Sharee Pimple, at bedside to discuss discharge plan and SNF process. Patient lives at [facility] and will return there after SNF stay.   Patient has previously been to [place] and does not wish to return. Patient's daughter, Sharee Pimple, lives in [place] and another daughter lives in [place]. They prefer facilities closer to those areas. Sharee Pimple requested list of SNFs to review. CSW provided list but emphasized that not every facility will be able to accept patient and CSW will provide additional Medicare list with accepted facilities listed.  Explained SNF referral and insurance auth processes. Daughter reports understanding.   CSW will  complete FL2 and send out referrals. HTA insurance Josem Kaufmann will be required prior to discharge.  Employment status:  Retired Nurse, adult PT Recommendations:  [place] / Referral to community resources:  [place]  Patient/Family's Response to care:  Patient and family accepting of SNF placement at d/c. They were pleasant and familiar with the process.  Patient/Family's Understanding of and Emotional Response to Diagnosis, Current Treatment, and Prognosis:  Patient and daughter understands process and CSW's role. They know CSW will f/u with bed offers.   Emotional Assessment Appearance:  Appears stated age Attitude/Demeanor/Rapport:  Engaged Affect (typically observed):  Accepting, Appropriate, Pleasant Orientation:  Oriented to Self, Oriented to Place, Oriented to  Time, Oriented to Situation Alcohol / Substance use:  Not Applicable Psych involvement (Current and /or in the community):  No (Comment)  Discharge Needs  Concerns to be addressed:  Care Coordination Readmission within the last 30 days:  No Current discharge risk:  Physical Impairment Barriers to Discharge:  Ship broker, Continued Medical Work up   [employer], [place] 02/15/2018, 4:26 PM

## 2018-02-15 NOTE — Progress Notes (Addendum)
PROGRESS NOTE  MAKYNLIE [name] OXB:[id number] DOB: 04/12/1940 DOA: 02/12/2018 PCP: Lauree Chandler, NP  HPI/Recap of past 24 hours: Per admission H&P: Susan [name] is a 78 y.o. female with medical history significant of prior CVA, hypertension, hyperlipidemia, paroxysmal A. fib on chronic anticoagulation with Eliquis, spinal stenosis, who presents to the hospital with chief complaint of having a fall as well as left lower extremity pain.  Patient has been dealing with left lower extremity cellulitis for the past 2 months.  She was hospitalized twice in December 2019, received a course of intravenous antibiotics in the hospital, she was transitioned to p.o., discharged and upon finishing up the p.o. antibiotics for cellulitis recurred.  Most recent discharge was at the end of December 2019, she had antibiotics for a few days at her SNF but she has now been off her antibiotics for the past 3 to 4 weeks.  For the last week, she has noticed increased redness and swelling of the left lower extremity, as well as increased pain.   Subjective: Seen at bedside doing well.  Denies any fever there is no pain in the lower extremity until it is palpated deeply.  Daughter is at bedside she is concerned that last time they discontinued the IV antibiotics too soon and she ended up having another infection.    Subjective: February 15, 2018.  Patient seen at bedside with daughter doing good.  Her daughter stated that social worker contacted her and give her a list of skilled nursing facilities to choose from.  She also received speech therapy and they recommended SNF as well.  She still on antibiotics denies any pain.  Assessment/Plan: Principal Problem:   Cellulitis Active Problems:   TIA (transient ischemic attack)   History of CVA (cerebrovascular accident)   HTN (hypertension)  1.  Recurrent left left lower extremity cellulitis.  Patient is on vancomycin and ceftriaxone will continue with that  2.  Elevated  uric acid likely gout ,uric acid is 7.2.  started her on colchicine  3.  History of CVA with dysarthria.  Patient has increased fall risk she was admitted from a assisted living facility.  PT/ OT was consulted and they both recommend skilled nursing facility.  Social worker consult has been put in for evaluation for skilled nursing placement still waiting for social worker consult.Marland Kitchen  Speech therapy evaluated and recommended skilled facility and speech therapy  4.  Hypertension uncontrolled continue current management  5.  Acute on chronic kidney failure stage III.  Patient is close to her baseline creatinine today is 1.43 yesterday was 1.38.  Her baseline is between 1.2 and 1.4 I will add gentle IV hydration and recheck in the morning  6.  History of chronic systolic dysfunction heart failure current EF of 55 to 60% she has had EF as low as 35%  with dilated cardiomyopathy patient is well compensated at this time.  We will continue her IV Lasix  7.  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  Paroxysmal atrial fibrillation we will continue Eliquis   9. severe osteoarthritis.  Patient is being considered for left knee replacement.  But due to her cellulitis it skin be recommended to hold it until she is completely healed  Code Status: Full  Severity of Illness: The appropriate patient status for this patient is INPATIENT. Inpatient status is judged to be reasonable and necessary in order to provide the required intensity of service to ensure the patient's  safety. The patient's presenting symptoms, physical exam findings, and initial radiographic and laboratory data in the context of their chronic comorbidities is felt to place them at high risk for further clinical deterioration. Furthermore, it is not anticipated that the patient will be medically stable for discharge from the hospital within 2 midnights of admission. The following factors support the patient status of  inpatient.   " The patient's presenting symptoms include cellulitis recurrent of left lower extremity. " The worrisome physical exam findings include cellulitis of the left lower extremity requiring IV antibiotics. " The initial radiographic and laboratory data are worrisome because of none. " The chronic co-morbidities include CVA increased fall risk at current living arrangement.   * I certify that at the point of admission it is my clinical judgment that the patient will require inpatient hospital care spanning beyond 2 midnights from the point of admission due to high intensity of service, high risk for further deterioration and high frequency of surveillance required.*    Family Communication: Daughter at bedside Susan [name].  I informed her that the ultrasound does not show any Baker's cyst.  Also there is no DVT on the left leg.  Also physical and occupational therapy is recommended SNF.  Also advised her of the elevated uric acid which indicates gout.  She stated that sometime in the past somebody had suggested she may have gout  Disposition Plan: SNF   Consultants:  Physical therapy  Occupational Therapy  Speech therapy  Procedures:  None  Antimicrobials:  Vancomycin and ceftriaxone  DVT prophylaxis: Eliquis   Objective: Vitals:   02/15/18 0415 02/15/18 1011 02/15/18 1131 02/15/18 1342  BP: (!) 151/77  (!) 156/79 139/79  Pulse: 62 73 62 66  Resp: 14  16 14   Temp: 98.1 F (36.7 C)  97.8 F (36.6 C) 98.9 F (37.2 C)  TempSrc: Oral  Oral Oral  SpO2: 97%  97% 97%  Weight:      Height:        Intake/Output Summary (Last 24 hours) at 02/15/2018 1546 Last data filed at 02/15/2018 1354 Gross per 24 hour  Intake 820 ml  Output 450 ml  Net 370 ml   Filed Weights   02/12/18 1032 02/12/18 1833  Weight: 90.7 kg 79.9 kg   Body mass index is 32.22 kg/m.  Exam:  . General: 78 y.o. year-old female well developed well nourished in no acute distress.  Alert and oriented  x3. . Cardiovascular: Regular rate and rhythm with no rubs or gallops.  No thyromegaly or JVD noted.   Marland Kitchen Respiratory: Clear to auscultation with no wheezes or rales. Good inspiratory effort. . Abdomen: Soft nontender nondistended with normal bowel sounds x4 quadrants. . Musculoskeletal: No lower extremity edema. 2/4 pulses in all 4 extremities. . Skin: No ulcerative lesions noted or rashes,.  The left lower extremity does not show any warmness, there is increased dark discoloration swelling is also improved as evidenced by some scaliness, no tenderness to light palpation but tender to deeper palpation. Marland Kitchen Psychiatry: Mood is appropriate for condition and setting    Data Reviewed: CBC: Recent Labs  Lab 02/12/18 1246 02/14/18 0242  WBC 4.1 3.2*  NEUTROABS 2.6  --   HGB 10.1* 9.1*  HCT 32.9* 30.0*  MCV 96.5 96.8  PLT 245 161   Basic Metabolic Panel: Recent Labs  Lab 02/12/18 1246 02/14/18 0242  NA 139 140  K 3.8 3.7  CL 104 106  CO2 27 26  GLUCOSE 99 124*  BUN 21 19  CREATININE 1.38* 1.48*  CALCIUM 10.0 9.2   GFR: Estimated Creatinine Clearance: 29.7 mL/min (A) (by C-G formula based on SCr of 1.48 mg/dL (H)). Liver Function Tests: Recent Labs  Lab 02/14/18 0242  AST 23  ALT 14  ALKPHOS 58  BILITOT 0.4  PROT 6.0*  ALBUMIN 3.0*   No results for input(s): LIPASE, AMYLASE in the last 168 hours. No results for input(s): AMMONIA in the last 168 hours. Coagulation Profile: No results for input(s): INR, PROTIME in the last 168 hours. Cardiac Enzymes: No results for input(s): CKTOTAL, CKMB, CKMBINDEX, TROPONINI in the last 168 hours. BNP (last 3 results) No results for input(s): PROBNP in the last 8760 hours. HbA1C: Recent Labs    02/13/18 0423  HGBA1C 5.9*   CBG: No results for input(s): GLUCAP in the last 168 hours. Lipid Profile: Recent Labs    02/13/18 0423  CHOL 179  HDL 63  LDLCALC 103*  TRIG 65  CHOLHDL 2.8   Thyroid Function Tests: No results  for input(s): TSH, T4TOTAL, FREET4, T3FREE, THYROIDAB in the last 72 hours. Anemia Panel: No results for input(s): VITAMINB12, FOLATE, FERRITIN, TIBC, IRON, RETICCTPCT in the last 72 hours. Urine analysis:    Component Value Date/Time   COLORURINE YELLOW 02/12/2018 1628   APPEARANCEUR CLEAR 02/12/2018 1628   LABSPEC 1.009 02/12/2018 1628   PHURINE 8.0 02/12/2018 1628   GLUCOSEU NEGATIVE 02/12/2018 1628   HGBUR NEGATIVE 02/12/2018 1628   BILIRUBINUR NEGATIVE 02/12/2018 1628   KETONESUR NEGATIVE 02/12/2018 1628   PROTEINUR NEGATIVE 02/12/2018 1628   UROBILINOGEN 0.2 03/05/2013 1259   NITRITE NEGATIVE 02/12/2018 1628   LEUKOCYTESUR NEGATIVE 02/12/2018 1628   Sepsis Labs: @LABRCNTIP (procalcitonin:4,lacticidven:4)  ) Recent Results (from the past 240 hour(s))  MRSA PCR Screening     Status: None   Collection Time: 02/13/18  8:48 AM  Result Value Ref Range Status   MRSA by PCR NEGATIVE NEGATIVE Final    Comment:        The GeneXpert MRSA Assay (FDA approved for NASAL specimens only), is one component of a comprehensive MRSA colonization surveillance program. It is not intended to diagnose MRSA infection nor to guide or monitor treatment for MRSA infections. Performed at [facility], [place] [address], [place], [place] [address]       Studies: No results found.  Scheduled Meds: . apixaban  5 mg Oral BID  . buPROPion  450 mg Oral Daily  . calcium-vitamin D  1 tablet Oral BID  . carvedilol  12.5 mg Oral BID WC  . cholecalciferol  1,000 Units Oral Daily  . colchicine  0.6 mg Oral BID  . diclofenac sodium  2 g Topical QID  . docusate sodium  100 mg Oral Daily  . donepezil  10 mg Oral QHS  . furosemide  20 mg Oral Daily  . gabapentin  300 mg Oral BID  . iron polysaccharides  150 mg Oral Daily  . losartan  50 mg Oral Daily  . multivitamin with minerals  1 tablet Oral Daily  . MUSCLE RUB  1 application Topical TID  . pantoprazole  40 mg Oral Daily  .  saccharomyces boulardii  250 mg Oral BID  . sertraline  12.5 mg Oral BID  . vitamin B-12  2,000 mcg Oral Daily    Continuous Infusions: . cefTRIAXone (ROCEPHIN)  IV Stopped (02/14/18 2033)  . vancomycin       LOS: 2 days     Cristal Deer,  MD [facility]  To reach me or the doctor on call, go to: [email or web address] Password [email or web address]  02/15/2018, 3:46 PM

## 2018-02-15 NOTE — NC FL2 (Signed)
[place] LEVEL OF CARE SCREENING TOOL     IDENTIFICATION  Patient Name: Susan [name] Birthdate: 09/16/1940 Sex: female Admission Date (Current Location): 02/12/2018  [facility] and [place] Number:  Herbalist and Address:  [facility],  [place], [place]      Provider Number: [id number]  Attending Physician Name and Address:  Cristal Deer, MD  Relative Name and Phone Number:  Shanette Tamargo: [phone]    Current Level of Care: Hospital Recommended Level of Care: [place] Prior Approval Number:    Date Approved/Denied:   PASRR Number: [id number]  Discharge Plan: SNF    Current Diagnoses: Patient Active Problem List   Diagnosis Date Noted  . Cellulitis 02/12/2018  . HTN (hypertension) 02/12/2018  . Cellulitis and abscess of leg 12/29/2017  . CKD (chronic kidney disease) stage 3, GFR 30-59 ml/min (HCC) 12/29/2017  . Cellulitis of left lower extremity   . Ankle edema   . Sepsis ([place]) 12/21/2017  . Medication management 10/11/2016  . Hyperlipemia 06/18/2016  . Gait abnormality 06/18/2016  . Major depressive disorder, recurrent episode, severe ([place]) 11/21/2015  . Edema 02/09/2015  . History of CVA (cerebrovascular accident) 02/01/2015  . 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 Chronic low back pain 01/28/2015  . Short-term memory loss 01/28/2015  . Volume depletion 01/28/2015  . Persistent atrial fibrillation 01/26/2015  . DCM (dilated cardiomyopathy) ([place]) 01/26/2015  . TIA (transient ischemic attack) 01/24/2015  . Chronic diastolic (congestive) heart failure ([place])   . Obesity (BMI 30-39.9) 01/27/2013  . Constipation 09/02/2012  . H/O arthroscopy of right knee 08/06/2012  . Anxiety   . Depression   . GERD (gastroesophageal reflux disease)   . Hypertensive heart disease   . OSA (obstructive sleep apnea)   .  Osteoarthritis of right knee   . Hyperparathyroidism, primary ([place]) 04/22/2012  . Chronic cough 10/02/2011    Orientation RESPIRATION BLADDER Height & Weight     Self, Time, Situation  Normal External catheter Weight: 176 lb 2.4 oz (79.9 kg) Height:  5\' 2"  (157.5 cm)  BEHAVIORAL SYMPTOMS/MOOD NEUROLOGICAL BOWEL NUTRITION STATUS      Continent Diet(Regular)  AMBULATORY STATUS COMMUNICATION OF NEEDS Skin   Extensive Assist Verbally Normal                       Personal Care Assistance Level of Assistance  Bathing, Feeding, Dressing Bathing Assistance: Maximum assistance Feeding assistance: Limited assistance Dressing Assistance: Limited assistance     Functional Limitations Info  Sight, Hearing, Speech Sight Info: Adequate Hearing Info: Adequate Speech Info: Adequate    SPECIAL CARE FACTORS FREQUENCY  PT (By licensed PT), OT (By licensed OT), Speech therapy     PT Frequency: 5x/week OT Frequency: 5x/week            Contractures Contractures Info: Not present    Additional Factors Info  Code Status, Allergies, Psychotropic Code Status Info: Full Allergies Info: ACE INHIBITORS, LIPITOR ATORVASTATIN, SIMVASTATIN, LATEX  Psychotropic Info: Wellbutrin, Zoloft, PRN Ativan         Current Medications (02/15/2018):  This is the current hospital active medication list Current Facility-Administered Medications  Medication Dose Route Frequency Provider Last Rate Last Dose  . acetaminophen (TYLENOL) tablet 650 mg  650 mg Oral Q4H PRN Caren Griffins, MD   650 mg at 02/13/18 [phone]  Or  . acetaminophen (TYLENOL) solution 650 mg  650 mg Per Tube Q4H PRN Caren Griffins, MD       Or  . acetaminophen (TYLENOL) suppository 650 mg  650 mg Rectal Q4H PRN Caren Griffins, MD      . apixaban (ELIQUIS) tablet 5 mg  5 mg Oral BID Caren Griffins, MD   5 mg at 02/15/18 1015  . buPROPion (WELLBUTRIN XL) 24 hr tablet 450 mg  450 mg Oral Daily Caren Griffins, MD   450 mg  at 02/15/18 1013  . calcium-vitamin D (OSCAL WITH D) 500-200 MG-UNIT per tablet 1 tablet  1 tablet Oral BID Caren Griffins, MD   1 tablet at 02/15/18 1014  . carvedilol (COREG) tablet 12.5 mg  12.5 mg Oral BID WC Caren Griffins, MD   12.5 mg at 02/15/18 1012  . cefTRIAXone (ROCEPHIN) 1 g in sodium chloride 0.9 % 100 mL IVPB  1 g Intravenous Q24H Caren Griffins, MD   Stopped at 02/14/18 2033  . cholecalciferol (VITAMIN D) tablet 1,000 Units  1,000 Units Oral Daily Caren Griffins, MD   1,000 Units at 02/15/18 1015  . colchicine tablet 0.6 mg  0.6 mg Oral BID Cristal Deer, MD   0.6 mg at 02/14/18 2131  . cyclobenzaprine (FLEXERIL) tablet 5 mg  5 mg Oral TID PRN Caren Griffins, MD      . diclofenac sodium (VOLTAREN) 1 % transdermal gel 2 g  2 g Topical QID Caren Griffins, MD   2 g at 02/14/18 2132  . docusate sodium (COLACE) capsule 100 mg  100 mg Oral Daily Caren Griffins, MD   100 mg at 02/15/18 1015  . donepezil (ARICEPT) tablet 10 mg  10 mg Oral QHS Caren Griffins, MD   10 mg at 02/14/18 2131  . furosemide (LASIX) tablet 20 mg  20 mg Oral Daily Caren Griffins, MD   20 mg at 02/15/18 1011  . gabapentin (NEURONTIN) capsule 300 mg  300 mg Oral BID Caren Griffins, MD   300 mg at 02/15/18 1013  . iron polysaccharides (NIFEREX) capsule 150 mg  150 mg Oral Daily Caren Griffins, MD   150 mg at 02/15/18 1011  . losartan (COZAAR) tablet 50 mg  50 mg Oral Daily Caren Griffins, MD   50 mg at 02/15/18 1015  . multivitamin with minerals tablet 1 tablet  1 tablet Oral Daily Caren Griffins, MD   1 tablet at 02/15/18 1014  . MUSCLE RUB CREA 1 application  1 application Topical TID Caren Griffins, MD   1 application at 92/33/00 2132  . pantoprazole (PROTONIX) EC tablet 40 mg  40 mg Oral Daily Caren Griffins, MD   40 mg at 02/15/18 1015  . saccharomyces boulardii (FLORASTOR) capsule 250 mg  250 mg Oral BID Caren Griffins, MD   250 mg at 02/15/18 1014  . sertraline  (ZOLOFT) tablet 12.5 mg  12.5 mg Oral BID Caren Griffins, MD   12.5 mg at 02/15/18 1014  . traMADol (ULTRAM) tablet 50 mg  50 mg Oral Q6H PRN Caren Griffins, MD      . vancomycin (VANCOCIN) IVPB 1000 mg/200 mL premix  1,000 mg Intravenous Q48H Emiliano [place], RPH      . vitamin B-12 (CYANOCOBALAMIN) tablet 2,000 mcg  2,000 mcg Oral Daily Caren Griffins, MD   2,000 mcg at 02/15/18 1014  Discharge Medications: Please see discharge summary for a list of discharge medications.  Relevant Imaging Results:  Relevant Lab Results:   Additional Information SSN: [id number]  Pricilla Holm, [place]

## 2018-02-16 LAB — CBC WITH DIFFERENTIAL/PLATELET
Abs Immature Granulocytes: 0 10*3/uL (ref 0.00–0.07)
Basophils Absolute: 0 10*3/uL (ref 0.0–0.1)
Basophils Relative: 1 %
Eosinophils Absolute: 0.2 10*3/uL (ref 0.0–0.5)
Eosinophils Relative: 6 %
HEMATOCRIT: 30.5 % — AB (ref 36.0–46.0)
Hemoglobin: 9.2 g/dL — ABNORMAL LOW (ref 12.0–15.0)
Immature Granulocytes: 0 %
Lymphocytes Relative: 30 %
Lymphs Abs: 0.8 10*3/uL (ref 0.7–4.0)
MCH: 28.9 pg (ref 26.0–34.0)
MCHC: 30.2 g/dL (ref 30.0–36.0)
MCV: 95.9 fL (ref 80.0–100.0)
MONO ABS: 0.4 10*3/uL (ref 0.1–1.0)
MONOS PCT: 16 %
Neutro Abs: 1.4 10*3/uL — ABNORMAL LOW (ref 1.7–7.7)
Neutrophils Relative %: 47 %
Platelets: 243 10*3/uL (ref 150–400)
RBC: 3.18 MIL/uL — ABNORMAL LOW (ref 3.87–5.11)
RDW: 15.1 % (ref 11.5–15.5)
WBC: 2.8 10*3/uL — ABNORMAL LOW (ref 4.0–10.5)
nRBC: 0 % (ref 0.0–0.2)

## 2018-02-16 MED ORDER — CEPHALEXIN 500 MG PO CAPS
500.0000 mg | ORAL_CAPSULE | Freq: Three times a day (TID) | ORAL | Status: DC
  Administered 2018-02-16 – 2018-02-20 (×11): 500 mg via ORAL
  Filled 2018-02-16 (×12): qty 1

## 2018-02-16 MED ORDER — HYDRALAZINE HCL 25 MG PO TABS
25.0000 mg | ORAL_TABLET | Freq: Three times a day (TID) | ORAL | Status: DC
  Administered 2018-02-16 – 2018-02-20 (×12): 25 mg via ORAL
  Filled 2018-02-16 (×12): qty 1

## 2018-02-16 NOTE — Progress Notes (Signed)
Physical Therapy Treatment Patient Details Name: Susan [name] MRN: [id number] DOB: June 15, 1940 Today's Date: 02/16/2018    History of Present Illness 78 y.o. female with medical history significant of prior CVA, hypertension, hyperlipidemia, paroxysmal A. fib on chronic anticoagulation with Eliquis, spinal stenosis, vascular dementia, chronic back pain who presents to the hospital with chief complaint of having a fall as well as left lower extremity pain and admitted for recurrent left lower extremity cellulitis    PT Comments    Pt able to self assist to EOB although slow and effortful.  Pt reports L LE feeling better today.  Pt performed a few sit to stands for strengthening and technique prior to pivoting over to recliner.  Pt hopeful to d/c to rehab and eventually be able to ambulate with RW again.   Follow Up Recommendations  SNF     Equipment Recommendations  None recommended by PT    Recommendations for Other Services       Precautions / Restrictions Precautions Precautions: Fall Precaution Comments: dysarthric speech, word finding difficulty (improved today compared to last visit)    Mobility  Bed Mobility Overal bed mobility: Needs Assistance Bed Mobility: Supine to Sit     Supine to sit: Min guard     General bed mobility comments: pt requires increased time, effortful movements  Transfers Overall transfer level: Needs assistance Equipment used: Rolling walker (2 wheeled) Transfers: Sit to/from [facility] Sit to Stand: Mod assist Stand pivot transfers: Min assist       General transfer comment: practiced sit to stands x2 working on technique as pt with difficulty with rise and loses balance posteriorly, improved on 3rd sit to stand and pt able to weight shift so pivoted with max verbal cues for sequencing to recliner  Ambulation/Gait                 Stairs             Wheelchair Mobility    Modified Rankin (Stroke  Patients Only)       Balance Overall balance assessment: Needs assistance;History of Falls Sitting-balance support: No upper extremity supported;Feet supported Sitting balance-Leahy Scale: Fair     Standing balance support: Bilateral upper extremity supported Standing balance-Leahy Scale: Zero Standing balance comment: requires max assist for maintaining balance                            Cognition Arousal/Alertness: Awake/alert Behavior During Therapy: WFL for tasks assessed/performed Overall Cognitive Status: History of cognitive impairments - at baseline                                 General Comments: likely at baseline however no family present      Exercises      General Comments        Pertinent Vitals/Pain Pain Assessment: No/denies pain    Home Living                      Prior Function            PT Goals (current goals can now be found in the care plan section) Progress towards PT goals: Progressing toward goals    Frequency    Min 2X/week      PT Plan Current plan remains appropriate    Co-evaluation  AM-PAC PT "6 Clicks" Mobility   Outcome Measure  Help needed turning from your back to your side while in a flat bed without using bedrails?: A Little Help needed moving from lying on your back to sitting on the side of a flat bed without using bedrails?: A Little Help needed moving to and from a bed to a chair (including a wheelchair)?: A Lot Help needed standing up from a chair using your arms (e.g., wheelchair or bedside chair)?: A Lot Help needed to walk in hospital room?: Total Help needed climbing 3-5 steps with a railing? : Total 6 Click Score: 12    End of Session Equipment Utilized During Treatment: Gait belt Activity Tolerance: Patient tolerated treatment well Patient left: in chair;with call bell/phone within reach;with chair alarm set Nurse Communication: Mobility status PT  Visit Diagnosis: Muscle weakness (generalized) (M62.81)     Time: 1007-1219 PT Time Calculation (min) (ACUTE ONLY): 19 min  Charges:  $Therapeutic Activity: 8-22 mins                    Carmelia Bake, PT, DPT Acute Rehabilitation Services Office: [phone] Pager: [phone]  Trena Platt 02/16/2018, 1:24 PM

## 2018-02-16 NOTE — Progress Notes (Signed)
Occupational Therapy Treatment Patient Details Name: Susan [name] MRN: [id number] DOB: [birth year] Today's Date: 02/16/2018    History of present illness 78 y.o. female with medical history significant of prior CVA, hypertension, hyperlipidemia, paroxysmal A. fib on chronic anticoagulation with Eliquis, spinal stenosis, vascular dementia, chronic back pain who presents to the hospital with chief complaint of having a fall as well as left lower extremity pain and admitted for recurrent left lower extremity cellulitis   OT comments  Pt needed significant A with getting to [facility]. ( 2 person A)  Follow Up Recommendations  SNF    Equipment Recommendations  None recommended by OT    Recommendations for Other Services      Precautions / Restrictions Precautions Precautions: Fall Precaution Comments: dysarthric speech, word finding difficulty       Mobility Bed Mobility Overal bed mobility: Needs Assistance Bed Mobility: Supine to Sit;Sit to Supine     Supine to sit: Mod assist        Transfers Overall transfer level: Needs assistance Equipment used: 2 person hand held assist Transfers: Sit to/from Stand;Stand Pivot Transfers Sit to Stand: +2 physical assistance;+2 safety/equipment;Max assist Stand pivot transfers: Max assist;+2 physical assistance;+2 safety/equipment       General transfer comment: did not perform a transfer    Balance Overall balance assessment: Needs assistance;History of Falls Sitting-balance support: No upper extremity supported;Feet supported Sitting balance-Leahy Scale: Fair                                     ADL either performed or assessed with clinical judgement   ADL Overall ADL's : Needs assistance/impaired Eating/Feeding: Sitting                       Toilet Transfer: +2 for physical assistance;+2 for safety/equipment;Maximal assistance;Stand-pivot;BSC   Toileting- Clothing Manipulation and Hygiene: +2 for  safety/equipment;+2 for physical assistance;Sit to/from stand;Cueing for sequencing;Cueing for safety         General ADL Comments: Pt needed significant A getting to [facility].       Vision Patient Visual Report: No change from baseline            Cognition Arousal/Alertness: Awake/alert Behavior During Therapy: WFL for tasks assessed/performed Overall Cognitive Status: History of cognitive impairments - at baseline                                 General Comments: likely at baseline however no family present                   Pertinent Vitals/ Pain       Pain Assessment: No/denies pain         Frequency  Min 2X/week        Progress Toward Goals  OT Goals(current goals can now be found in the care plan section)  Progress towards OT goals: Progressing toward goals     Plan Discharge plan remains appropriate       AM-PAC OT "6 Clicks" Daily Activity     Outcome Measure   Help from another person eating meals?: A Little Help from another person taking care of personal grooming?: A Little Help from another person toileting, which includes using toliet, bedpan, or urinal?: Total Help from another person bathing (including washing, rinsing, drying)?: A Lot Help from  another person to put on and taking off regular upper body clothing?: A Lot Help from another person to put on and taking off regular lower body clothing?: Total 6 Click Score: 12    End of Session Equipment Utilized During Treatment: Gait belt  OT Visit Diagnosis: Unsteadiness on feet (R26.81);Muscle weakness (generalized) (M62.81);Other abnormalities of gait and mobility (R26.89)   Activity Tolerance Patient limited by fatigue   Patient Left in bed;with call bell/phone within reach;with bed alarm set   Nurse Communication Mobility status        Time: 8979-1504 OT Time Calculation (min): 25 min  Charges: OT General Charges $OT Visit: 1 Visit OT Treatments $Self Care/Home  Management : 23-37 mins  Kari Baars, [place] [phone] Office- [phone]      [employer], Edwena Felty D 02/16/2018, 1:02 PM

## 2018-02-16 NOTE — Progress Notes (Signed)
PROGRESS NOTE  Susan [name] WNU:[id number] DOB: [birth year] DOA: 02/12/2018 PCP: Lauree Chandler, NP  HPI/Recap of past 24 hours: Per admission H&P: Susan [name] is a 78 y.o. female with medical history significant of prior CVA, hypertension, hyperlipidemia, paroxysmal A. fib on chronic anticoagulation with Eliquis, spinal stenosis, who presents to the hospital with chief complaint of having a fall as well as left lower extremity pain.  Patient has been dealing with left lower extremity cellulitis for the past 2 months.  She was hospitalized twice in December 2019, received a course of intravenous antibiotics in the hospital, she was transitioned to p.o., discharged and upon finishing up the p.o. antibiotics for cellulitis recurred.  Most recent discharge was at the end of December 2019, she had antibiotics for a few days at her SNF but she has now been off her antibiotics for the past 3 to 4 weeks.  For the last week, she has noticed increased redness and swelling of the left lower extremity, as well as increased pain.   Subjective: No acute pain no nausea no vomiting no fever no chills.  No chest pain.  Assessment/Plan: Principal Problem:   Cellulitis Active Problems:   TIA (transient ischemic attack)   History of CVA (cerebrovascular accident)   HTN (hypertension)  1.  Recurrent left left lower extremity cellulitis.  Patient is on vancomycin and ceftriaxone will transition to oral antibiotic.  2.  Elevated uric acid likely gout ,uric acid is 7.2.  started her on colchicine. I do not suspect that the patient actually has gout and therefore will discontinue colchicine as it is most likely the cause for patient's neutropenia.  3.  History of CVA with dysarthria.  Patient has increased fall risk she was admitted from a assisted living facility.  PT/ OT was consulted and they both recommend skilled nursing facility.  Social worker consult has been put in for evaluation for skilled nursing  placement still waiting for social worker consult.Marland Kitchen  Speech therapy evaluated and recommended skilled facility and speech therapy  4.  Hypertension uncontrolled continue current management  5.  Acute on chronic kidney failure stage III.  Patient is close to her baseline creatinine today is 1.43 yesterday was 1.38.  Her baseline is between 1.2 and 1.4 I will add gentle IV hydration and recheck in the morning  6.  History of chronic systolic dysfunction heart failure current EF of 55 to 60% she has had EF as low as 35%  with dilated cardiomyopathy patient is well compensated at this time.  We will continue her IV Lasix  7.  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  Paroxysmal atrial fibrillation we will continue Eliquis   9. severe osteoarthritis.  Patient is being considered for left knee replacement.  But due to her cellulitis it skin be recommended to hold it until she is completely healed  10.  Neutropenia. Likely in the setting of use of colchicine as well as active infection. We will discontinue colchicine and monitor.  Code Status: Full   Family Communication: No family at bedside  Disposition Plan: SNF   Consultants:  Physical therapy  Occupational Therapy  Speech therapy  Procedures:  None  Antimicrobials:  Vancomycin and ceftriaxone  DVT prophylaxis: Eliquis   Objective: Vitals:   02/15/18 2030 02/16/18 0150 02/16/18 0425 02/16/18 1339  BP: (!) 144/61 (!) 158/83 (!) 168/88 135/83  Pulse: 68 70 69 (!) 142  Resp: 18 16 14  18  Temp: 98.3 F (36.8 C) 98.9 F (37.2 C) 98.9 F (37.2 C) 98.4 F (36.9 C)  TempSrc: Oral Oral Oral Oral  SpO2: 99% 97% (!) 87% 98%  Weight:      Height:        Intake/Output Summary (Last 24 hours) at 02/16/2018 1915 Last data filed at 02/16/2018 1342 Gross per 24 hour  Intake 770 ml  Output 1350 ml  Net -580 ml   Filed Weights   02/12/18 1032 02/12/18 1833  Weight: 90.7 kg 79.9 kg   Body  mass index is 32.22 kg/m.  Exam:  . General: 78 y.o. year-old female well developed well nourished in no acute distress.  Alert and oriented x3. . Cardiovascular: Regular rate and rhythm with no rubs or gallops.  No thyromegaly or JVD noted.   Marland Kitchen Respiratory: Clear to auscultation with no wheezes or rales. Good inspiratory effort. . Abdomen: Soft nontender nondistended with normal bowel sounds x4 quadrants. . Musculoskeletal: No lower extremity edema. 2/4 pulses in all 4 extremities. . Skin: No ulcerative lesions noted or rashes,.  The left lower extremity does not show any warmness, there is increased dark discoloration swelling is also improved as evidenced by some scaliness, no tenderness to light palpation but tender to deeper palpation. Marland Kitchen Psychiatry: Mood is appropriate for condition and setting    Data Reviewed: CBC: Recent Labs  Lab 02/12/18 1246 02/14/18 0242 02/16/18 0510  WBC 4.1 3.2* 2.8*  NEUTROABS 2.6  --  1.4*  HGB 10.1* 9.1* 9.2*  HCT 32.9* 30.0* 30.5*  MCV 96.5 96.8 95.9  PLT 245 249 425   Basic Metabolic Panel: Recent Labs  Lab 02/12/18 1246 02/14/18 0242 02/15/18 1911  NA 139 140 139  K 3.8 3.7 3.7  CL 104 106 105  CO2 27 26 29   GLUCOSE 99 124* 111*  BUN 21 19 22   CREATININE 1.38* 1.48* 1.19*  CALCIUM 10.0 9.2 9.3   GFR: Estimated Creatinine Clearance: 36.9 mL/min (A) (by C-G formula based on SCr of 1.19 mg/dL (H)). Liver Function Tests: Recent Labs  Lab 02/14/18 0242  AST 23  ALT 14  ALKPHOS 58  BILITOT 0.4  PROT 6.0*  ALBUMIN 3.0*   No results for input(s): LIPASE, AMYLASE in the last 168 hours. No results for input(s): AMMONIA in the last 168 hours. Coagulation Profile: No results for input(s): INR, PROTIME in the last 168 hours. Cardiac Enzymes: No results for input(s): CKTOTAL, CKMB, CKMBINDEX, TROPONINI in the last 168 hours. BNP (last 3 results) No results for input(s): PROBNP in the last 8760 hours. HbA1C: No results for  input(s): HGBA1C in the last 72 hours. CBG: No results for input(s): GLUCAP in the last 168 hours. Lipid Profile: No results for input(s): CHOL, HDL, LDLCALC, TRIG, CHOLHDL, LDLDIRECT in the last 72 hours. Thyroid Function Tests: No results for input(s): TSH, T4TOTAL, FREET4, T3FREE, THYROIDAB in the last 72 hours. Anemia Panel: No results for input(s): VITAMINB12, FOLATE, FERRITIN, TIBC, IRON, RETICCTPCT in the last 72 hours. Urine analysis:    Component Value Date/Time   COLORURINE YELLOW 02/12/2018 1628   APPEARANCEUR CLEAR 02/12/2018 1628   LABSPEC 1.009 02/12/2018 1628   PHURINE 8.0 02/12/2018 1628   GLUCOSEU NEGATIVE 02/12/2018 1628   HGBUR NEGATIVE 02/12/2018 1628   BILIRUBINUR NEGATIVE 02/12/2018 1628   KETONESUR NEGATIVE 02/12/2018 1628   PROTEINUR NEGATIVE 02/12/2018 1628   UROBILINOGEN 0.2 03/05/2013 1259   NITRITE NEGATIVE 02/12/2018 1628   LEUKOCYTESUR NEGATIVE 02/12/2018 1628   Sepsis Labs: @  LABRCNTIP(procalcitonin:4,lacticidven:4)  ) Recent Results (from the past 240 hour(s))  MRSA PCR Screening     Status: None   Collection Time: 02/13/18  8:48 AM  Result Value Ref Range Status   MRSA by PCR NEGATIVE NEGATIVE Final    Comment:        The GeneXpert MRSA Assay (FDA approved for NASAL specimens only), is one component of a comprehensive MRSA colonization surveillance program. It is not intended to diagnose MRSA infection nor to guide or monitor treatment for MRSA infections. Performed at [facility], [place] [address], [place], [place] [address]       Studies: No results found.  Scheduled Meds: . apixaban  5 mg Oral BID  . buPROPion  450 mg Oral Daily  . calcium-vitamin D  1 tablet Oral BID  . carvedilol  12.5 mg Oral BID WC  . cephALEXin  500 mg Oral Q8H  . cholecalciferol  1,000 Units Oral Daily  . diclofenac sodium  2 g Topical QID  . docusate sodium  100 mg Oral Daily  . donepezil  10 mg Oral QHS  . furosemide  20 mg Oral  Daily  . gabapentin  300 mg Oral BID  . hydrALAZINE  25 mg Oral Q8H  . iron polysaccharides  150 mg Oral Daily  . losartan  50 mg Oral Daily  . multivitamin with minerals  1 tablet Oral Daily  . MUSCLE RUB  1 application Topical TID  . pantoprazole  40 mg Oral Daily  . saccharomyces boulardii  250 mg Oral BID  . sertraline  12.5 mg Oral BID  . vitamin B-12  2,000 mcg Oral Daily    Continuous Infusions:    LOS: 3 days     Berle Mull, MD [facility]  To reach me or the doctor on call, go to: [email or web address]  02/16/2018, 7:15 PM

## 2018-02-16 NOTE — Care Management Important Message (Signed)
Important Message  Patient Details  Name: Susan [name] MRN: [id number] Date of Birth: 11-Jun-1940   Medicare Important Message Given:  Yes    Kerin Salen 02/16/2018, 11:45 AMImportant Message  Patient Details  Name: Susan [name] MRN: [id number] Date of Birth: [birth year]   Medicare Important Message Given:  Yes    Kerin Salen 02/16/2018, 11:45 AM

## 2018-02-16 NOTE — Progress Notes (Signed)
Discussed bed offers for SNF with pt and daughter Susan [name] (daughter via phone). Pt states she defers to daughter for decisions. Working to select SNF and initiate insurance authorization request.  Sharren Bridge, MSW, LCSW Clinical Social Work 02/16/2018 [phone]

## 2018-02-16 NOTE — Plan of Care (Signed)
  Problem: Health Behavior/Discharge Planning: Goal: Ability to manage health-related needs will improve Outcome: Progressing   Problem: Clinical Measurements: Goal: Ability to maintain clinical measurements within normal limits will improve Outcome: Progressing Goal: Will remain free from infection Outcome: Progressing Goal: Diagnostic test results will improve Outcome: Progressing Goal: Respiratory complications will improve Outcome: Progressing Goal: Cardiovascular complication will be avoided Outcome: Progressing   Problem: Activity: Goal: Risk for activity intolerance will decrease Outcome: Progressing   Problem: Nutrition: Goal: Adequate nutrition will be maintained Outcome: Progressing   Problem: Coping: Goal: Level of anxiety will decrease Outcome: Progressing   Problem: Elimination: Goal: Will not experience complications related to bowel motility Outcome: Progressing Goal: Will not experience complications related to urinary retention Outcome: Progressing   Problem: Pain Managment: Goal: General experience of comfort will improve Outcome: Progressing   Problem: Safety: Goal: Ability to remain free from injury will improve Outcome: Progressing   Problem: Skin Integrity: Goal: Risk for impaired skin integrity will decrease Outcome: Progressing   Problem: Education: Goal: Knowledge of disease or condition will improve Outcome: Progressing Goal: Knowledge of secondary prevention will improve Outcome: Progressing Goal: Knowledge of patient specific risk factors addressed and post discharge goals established will improve Outcome: Progressing

## 2018-02-17 LAB — CBC WITH DIFFERENTIAL/PLATELET
Abs Immature Granulocytes: 0.01 10*3/uL (ref 0.00–0.07)
Basophils Absolute: 0 10*3/uL (ref 0.0–0.1)
Basophils Relative: 1 %
EOS PCT: 7 %
Eosinophils Absolute: 0.2 10*3/uL (ref 0.0–0.5)
HCT: 32.5 % — ABNORMAL LOW (ref 36.0–46.0)
Hemoglobin: 9.9 g/dL — ABNORMAL LOW (ref 12.0–15.0)
Immature Granulocytes: 0 %
Lymphocytes Relative: 33 %
Lymphs Abs: 1 10*3/uL (ref 0.7–4.0)
MCH: 28.8 pg (ref 26.0–34.0)
MCHC: 30.5 g/dL (ref 30.0–36.0)
MCV: 94.5 fL (ref 80.0–100.0)
Monocytes Absolute: 0.5 10*3/uL (ref 0.1–1.0)
Monocytes Relative: 17 %
Neutro Abs: 1.2 10*3/uL — ABNORMAL LOW (ref 1.7–7.7)
Neutrophils Relative %: 42 %
Platelets: 273 10*3/uL (ref 150–400)
RBC: 3.44 MIL/uL — ABNORMAL LOW (ref 3.87–5.11)
RDW: 15.1 % (ref 11.5–15.5)
WBC: 2.9 10*3/uL — ABNORMAL LOW (ref 4.0–10.5)
nRBC: 0 % (ref 0.0–0.2)

## 2018-02-17 MED ORDER — HYDRALAZINE HCL 25 MG PO TABS: 25.0000 mg | ORAL_TABLET | Freq: Three times a day (TID) | ORAL | 0 refills | Status: DC

## 2018-02-17 MED ORDER — CARVEDILOL 12.5 MG PO TABS: 12.5000 mg | ORAL_TABLET | Freq: Two times a day (BID) | ORAL | 0 refills | Status: DC

## 2018-02-17 MED ORDER — CEPHALEXIN 500 MG PO CAPS: 500.0000 mg | ORAL_CAPSULE | Freq: Three times a day (TID) | ORAL | 0 refills | Status: DC

## 2018-02-17 NOTE — Progress Notes (Signed)
SNF insurance authorization in process for [facility], however facility was informed that pt has open liability case from 2014 with [facility] insurance that has to be closed prior to pt admitting to SNF. Pt informed- is calling [employer] to have documentation faxed so that insurance request for SNF can proceed.  Sharren Bridge, MSW, LCSW Clinical Social Work 02/17/2018 [phone]

## 2018-02-17 NOTE — Plan of Care (Signed)
  Problem: Health Behavior/Discharge Planning: Goal: Ability to manage health-related needs will improve Outcome: Progressing   Problem: Clinical Measurements: Goal: Ability to maintain clinical measurements within normal limits will improve Outcome: Progressing Goal: Will remain free from infection Outcome: Progressing Goal: Diagnostic test results will improve Outcome: Progressing Goal: Respiratory complications will improve Outcome: Progressing Goal: Cardiovascular complication will be avoided Outcome: Progressing   Problem: Activity: Goal: Risk for activity intolerance will decrease Outcome: Progressing   Problem: Nutrition: Goal: Adequate nutrition will be maintained Outcome: Progressing   Problem: Coping: Goal: Level of anxiety will decrease Outcome: Progressing   Problem: Elimination: Goal: Will not experience complications related to bowel motility Outcome: Progressing Goal: Will not experience complications related to urinary retention Outcome: Progressing   Problem: Pain Managment: Goal: General experience of comfort will improve Outcome: Progressing   Problem: Safety: Goal: Ability to remain free from injury will improve Outcome: Progressing   Problem: Skin Integrity: Goal: Risk for impaired skin integrity will decrease Outcome: Progressing   Problem: Education: Goal: Knowledge of disease or condition will improve Outcome: Progressing Goal: Knowledge of secondary prevention will improve Outcome: Progressing Goal: Knowledge of patient specific risk factors addressed and post discharge goals established will improve Outcome: Progressing

## 2018-02-17 NOTE — Progress Notes (Signed)
PROGRESS NOTE  Susan HILARIO [name]:[id number] DOB: October 21, 1940 DOA: 02/12/2018 PCP: Lauree Chandler, NP  HPI/Recap of past 24 hours: Per admission H&P: Susan [name] is a 78 y.o. female with medical history significant of prior CVA, hypertension, hyperlipidemia, paroxysmal A. fib on chronic anticoagulation with Eliquis, spinal stenosis, who presents to the hospital with chief complaint of having a fall as well as left lower extremity pain.  Patient has been dealing with left lower extremity cellulitis for the past 2 months.  She was hospitalized twice in December 2019, received a course of intravenous antibiotics in the hospital, she was transitioned to p.o., discharged and upon finishing up the p.o. antibiotics for cellulitis recurred.  Most recent discharge was at the end of December 2019, she had antibiotics for a few days at her SNF but she has now been off her antibiotics for the past 3 to 4 weeks.  For the last week, she has noticed increased redness and swelling of the left lower extremity, as well as increased pain.   Subjective: No acute event no fever no chills.  Assessment/Plan: Principal Problem:   Cellulitis Active Problems:   TIA (transient ischemic attack)   History of CVA (cerebrovascular accident)   HTN (hypertension)  1.  Recurrent left left lower extremity cellulitis.  Patient is on vancomycin and ceftriaxone currently transition to oral antibiotic.  2.  Elevated uric acid likely gout ,uric acid is 7.2.  started her on colchicine. I do not suspect that the patient actually has gout and therefore will discontinue colchicine as it is most likely the cause for patient's neutropenia.  3.  History of CVA with dysarthria.  Patient has increased fall risk she was admitted from a assisted living facility.  PT/ OT was consulted and they both recommend skilled nursing facility.  Social worker consult has been put in for evaluation for skilled nursing placement still waiting for social  worker consult.Marland Kitchen  Speech therapy evaluated and recommended skilled facility and speech therapy  4.  Hypertension uncontrolled continue current management  5.  Acute on chronic kidney failure stage III.  Patient is close to her baseline creatinine today is 1.43 yesterday was 1.38.  Her baseline is between 1.2 and 1.4 I will add gentle IV hydration and recheck in the morning  6.  History of chronic systolic dysfunction heart failure current EF of 55 to 60% she has had EF as low as 35%  with dilated cardiomyopathy patient is well compensated at this time.  We will continue her IV Lasix  7.  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  Paroxysmal atrial fibrillation we will continue Eliquis   9. severe osteoarthritis.  Patient is being considered for left knee replacement.  But due to her cellulitis it skin be recommended to hold it until she is completely healed  10.  Neutropenia. Likely in the setting of use of colchicine as well as active infection. We will discontinue colchicine. WBC currently stable.  Mild decrease in neutropenic count.  Monitor.  Code Status: Full   Family Communication: No family at bedside  Disposition Plan: SNF, when bed is available.   Consultants:  Physical therapy  Occupational Therapy  Speech therapy  Procedures:  None  Antimicrobials:  Vancomycin and ceftriaxone  DVT prophylaxis: Eliquis   Objective: Vitals:   02/16/18 2026 02/17/18 0004 02/17/18 0422 02/17/18 1437  BP: 136/70 132/71 (!) 166/90 112/69  Pulse: 65 61 62 64  Resp: 18 18 16  16  Temp: 97.9 F (36.6 C) 98.4 F (36.9 C) 98.3 F (36.8 C) 98.9 F (37.2 C)  TempSrc: Oral Oral Oral Oral  SpO2: 100% 96% 98% 95%  Weight:      Height:        Intake/Output Summary (Last 24 hours) at 02/17/2018 1932 Last data filed at 02/17/2018 1213 Gross per 24 hour  Intake -  Output 1100 ml  Net -1100 ml   Filed Weights   02/12/18 1032 02/12/18 1833  Weight:  90.7 kg 79.9 kg   Body mass index is 32.22 kg/m.  Exam:  . General: 78 y.o. year-old female well developed well nourished in no acute distress.  Alert and oriented x3. . Cardiovascular: Regular rate and rhythm with no rubs or gallops.  No thyromegaly or JVD noted.   Marland Kitchen Respiratory: Clear to auscultation with no wheezes or rales. Good inspiratory effort. . Abdomen: Soft nontender nondistended with normal bowel sounds x4 quadrants. . Musculoskeletal: No lower extremity edema. 2/4 pulses in all 4 extremities. . Skin: No ulcerative lesions noted or rashes,.  The left lower extremity does not show any warmness, there is increased dark discoloration swelling is also improved as evidenced by some scaliness, no tenderness to light palpation but tender to deeper palpation. Marland Kitchen Psychiatry: Mood is appropriate for condition and setting    Data Reviewed: CBC: Recent Labs  Lab 02/12/18 1246 02/14/18 0242 02/16/18 0510 02/17/18 0758  WBC 4.1 3.2* 2.8* 2.9*  NEUTROABS 2.6  --  1.4* 1.2*  HGB 10.1* 9.1* 9.2* 9.9*  HCT 32.9* 30.0* 30.5* 32.5*  MCV 96.5 96.8 95.9 94.5  PLT 245 249 243 175   Basic Metabolic Panel: Recent Labs  Lab 02/12/18 1246 02/14/18 0242 02/15/18 1911  NA 139 140 139  K 3.8 3.7 3.7  CL 104 106 105  CO2 27 26 29   GLUCOSE 99 124* 111*  BUN 21 19 22   CREATININE 1.38* 1.48* 1.19*  CALCIUM 10.0 9.2 9.3   GFR: Estimated Creatinine Clearance: 36.9 mL/min (A) (by C-G formula based on SCr of 1.19 mg/dL (H)). Liver Function Tests: Recent Labs  Lab 02/14/18 0242  AST 23  ALT 14  ALKPHOS 58  BILITOT 0.4  PROT 6.0*  ALBUMIN 3.0*   No results for input(s): LIPASE, AMYLASE in the last 168 hours. No results for input(s): AMMONIA in the last 168 hours. Coagulation Profile: No results for input(s): INR, PROTIME in the last 168 hours. Cardiac Enzymes: No results for input(s): CKTOTAL, CKMB, CKMBINDEX, TROPONINI in the last 168 hours. BNP (last 3 results) No results for  input(s): PROBNP in the last 8760 hours. HbA1C: No results for input(s): HGBA1C in the last 72 hours. CBG: No results for input(s): GLUCAP in the last 168 hours. Lipid Profile: No results for input(s): CHOL, HDL, LDLCALC, TRIG, CHOLHDL, LDLDIRECT in the last 72 hours. Thyroid Function Tests: No results for input(s): TSH, T4TOTAL, FREET4, T3FREE, THYROIDAB in the last 72 hours. Anemia Panel: No results for input(s): VITAMINB12, FOLATE, FERRITIN, TIBC, IRON, RETICCTPCT in the last 72 hours. Urine analysis:    Component Value Date/Time   COLORURINE YELLOW 02/12/2018 1628   APPEARANCEUR CLEAR 02/12/2018 1628   LABSPEC 1.009 02/12/2018 1628   PHURINE 8.0 02/12/2018 1628   GLUCOSEU NEGATIVE 02/12/2018 1628   HGBUR NEGATIVE 02/12/2018 1628   BILIRUBINUR NEGATIVE 02/12/2018 1628   KETONESUR NEGATIVE 02/12/2018 1628   PROTEINUR NEGATIVE 02/12/2018 1628   UROBILINOGEN 0.2 03/05/2013 1259   NITRITE NEGATIVE 02/12/2018 1628  LEUKOCYTESUR NEGATIVE 02/12/2018 1628   Sepsis Labs: @LABRCNTIP (procalcitonin:4,lacticidven:4)  ) Recent Results (from the past 240 hour(s))  MRSA PCR Screening     Status: None   Collection Time: 02/13/18  8:48 AM  Result Value Ref Range Status   MRSA by PCR NEGATIVE NEGATIVE Final    Comment:        The GeneXpert MRSA Assay (FDA approved for NASAL specimens only), is one component of a comprehensive MRSA colonization surveillance program. It is not intended to diagnose MRSA infection nor to guide or monitor treatment for MRSA infections. Performed at [facility], [place] [address] [place], [place] [address]       Studies: No results found.  Scheduled Meds: . apixaban  5 mg Oral BID  . buPROPion  450 mg Oral Daily  . calcium-vitamin D  1 tablet Oral BID  . carvedilol  12.5 mg Oral BID WC  . cephALEXin  500 mg Oral Q8H  . cholecalciferol  1,000 Units Oral Daily  . diclofenac sodium  2 g Topical QID  . docusate sodium  100 mg  Oral Daily  . donepezil  10 mg Oral QHS  . furosemide  20 mg Oral Daily  . gabapentin  300 mg Oral BID  . hydrALAZINE  25 mg Oral Q8H  . iron polysaccharides  150 mg Oral Daily  . losartan  50 mg Oral Daily  . multivitamin with minerals  1 tablet Oral Daily  . MUSCLE RUB  1 application Topical TID  . pantoprazole  40 mg Oral Daily  . saccharomyces boulardii  250 mg Oral BID  . sertraline  12.5 mg Oral BID  . vitamin B-12  2,000 mcg Oral Daily    Continuous Infusions:    LOS: 4 days     Berle Mull, MD [facility]  To reach me or the doctor on call, go to: [email or web address]  02/17/2018, 7:32 PM

## 2018-02-17 NOTE — Consult Note (Signed)
   [facility] CM Inpatient Consult   02/17/2018  VERN PRESTIA 12-10-1940 [id number]    Patient screened for potential [facility] Care Management services due to unplanned readmission risk score of 24% (high) and multiple hospitalizations.  Spoke with inpatient LCSW. Disposition plans are for SNF. Will continue to follow along for disposition plans and progression. Will engage for [facility] if disposition plans change.   Marthenia Rolling, MSN-Ed, RN,BSN [facility] Liaison [phone]

## 2018-02-17 NOTE — Progress Notes (Signed)
MD aware that pt has not been D/C'd today. See SW note. Daughter at bedside, upset that she has not talked to SW. VM left for SW, and will pass off to next RN.

## 2018-02-17 NOTE — Progress Notes (Signed)
Occupational Therapy Treatment Patient Details Name: Susan [name] MRN: [id number] DOB: [birth year] Today's Date: 02/17/2018    History of present illness 78 y.o. female with medical history significant of prior CVA, hypertension, hyperlipidemia, paroxysmal A. fib on chronic anticoagulation with Eliquis, spinal stenosis, vascular dementia, chronic back pain who presents to the hospital with chief complaint of having a fall as well as left lower extremity pain and admitted for recurrent left lower extremity cellulitis   OT comments  Pt seated EOB upon arrival pleasant and willing to participate in therapy session. Pt completed grooming ADL seated EOB with setup assist. Engaged in additional bil UE/LE exercise for continued strengthening/endurance, intermittently requiring minA for dynamic activity in sitting. Feel SNF recommendation remains appropriate at this time. Will continue to follow acutely.    Follow Up Recommendations  SNF    Equipment Recommendations  None recommended by OT          Precautions / Restrictions Precautions Precautions: Fall Precaution Comments: dysarthric speech, word finding difficulty (improved today) Restrictions Weight Bearing Restrictions: No       Mobility Bed Mobility               General bed mobility comments: pt seated EOB upon arrival  Transfers                      Balance Overall balance assessment: Needs assistance;History of Falls Sitting-balance support: Single extremity supported;Feet supported;Feet unsupported Sitting balance-Leahy Scale: Fair                                     ADL either performed or assessed with clinical judgement   ADL Overall ADL's : Needs assistance/impaired Eating/Feeding: Set up;Sitting Eating/Feeding Details (indicate cue type and reason): setup to open containers for coffee Grooming: Set up;Sitting;Wash/dry face;Oral care Grooming Details (indicate cue type and reason):  setup assist, pt performing seated EOB                                     Cognition Arousal/Alertness: Awake/alert Behavior During Therapy: WFL for tasks assessed/performed Overall Cognitive Status: History of cognitive impairments - at baseline                                 General Comments: likely at baseline however no family present        Exercises Exercises: General Upper Extremity;General Lower Extremity;Other exercises General Exercises - Lower Extremity Ankle Circles/Pumps: AROM;Both;10 reps;Seated Long Arc Quad: Both;AROM;Seated Other Exercises Other Exercises: shoulder rolls, x10 bil UE Other Exercises: arm circles, x10 bil UE   Shoulder Instructions       General Comments      Pertinent Vitals/ Pain       Pain Assessment: No/denies pain  Home Living                                                        Frequency  Min 2X/week        Progress Toward Goals  OT Goals(current goals can now be found in the care plan section)  Progress  towards OT goals: Progressing toward goals  Acute Rehab OT Goals Patient Stated Goal: hopeful for d/c to rehab today OT Goal Formulation: With patient Time For Goal Achievement: 02/27/18 Potential to Achieve Goals: Good  Plan Discharge plan remains appropriate    Co-evaluation                 AM-PAC OT "6 Clicks" Daily Activity     Outcome Measure   Help from another person eating meals?: A Little Help from another person taking care of personal grooming?: A Little Help from another person toileting, which includes using toliet, bedpan, or urinal?: A Lot Help from another person bathing (including washing, rinsing, drying)?: A Lot Help from another person to put on and taking off regular upper body clothing?: A Lot Help from another person to put on and taking off regular lower body clothing?: Total 6 Click Score: 13    End of Session    OT Visit  Diagnosis: Unsteadiness on feet (R26.81);Muscle weakness (generalized) (M62.81);Other abnormalities of gait and mobility (R26.89)   Activity Tolerance Patient tolerated treatment well   Patient Left with call bell/phone within reach;with bed alarm set(seated EOB; CSW present)   Nurse Communication Mobility status        Time: 8101-7510 OT Time Calculation (min): 14 min  Charges: OT General Charges $OT Visit: 1 Visit OT Treatments $Self Care/Home Management : 8-22 mins  Lou Cal, OT Supplemental Rehabilitation Services Pager [phone] Office [phone]   Raymondo Band 02/17/2018, 12:59 PM

## 2018-02-18 NOTE — Progress Notes (Signed)
PROGRESS NOTE    Susan [name]  DUK:[id number] DOB: 1940/08/21 DOA: 02/12/2018 PCP: Lauree Chandler, NP    Brief Narrative:  78 y.o.femalewith medical history significant ofprior CVA, hypertension, hyperlipidemia, paroxysmal A. fib on chronic anticoagulation with Eliquis, spinal stenosis, who presents to the hospital with chief complaint of having a fall as well as left lower extremity pain. Patient has been dealing with left lower extremity cellulitis for the past 2 months. She was hospitalized twice in December 2019, received a course of intravenous antibiotics in the hospital, she was transitioned to p.o., discharged and upon finishing up the p.o. antibiotics for cellulitis recurred. Most recent discharge was at the end of December 2019, she had antibiotics for a few days at her SNF but she has now been off her antibiotics for the past 3 to 4 weeks. For the last week, she has noticed increased redness and swelling of the left lower extremity, as well as increased pain.   Assessment & Plan:   Principal Problem:   Cellulitis Active Problems:   TIA (transient ischemic attack)   History of CVA (cerebrovascular accident)   HTN (hypertension)   1.  Recurrent left left lower extremity cellulitis.  Patient initially had been on vancomycin and ceftriaxone, now on PO keflex with anticipation to complete tx on 2/13  2.  Elevated uric acid likely gout ,uric acid is 7.2.  initially started her on colchicine. Discontinued colchicine as it is most likely the cause for patient's neutropenia.  3.  History of CVA with dysarthria.  Patient has increased fall risk she was admitted from a assisted living facility.  PT/ OT was consulted and they both recommend skilled nursing facility.  Social worker consult has been put in for evaluation for skilled nursing placement still waiting for social worker consult.Marland Kitchen Speech therapy evaluated and recommended skilled facility and speech therapy  4.   Hypertension -BP stable at this time -Currently on coreg, lasix, losartan  5.  Acute on chronic kidney failure stage III.  Patient is close to her baseline creatinine today is 1.43 yesterday was 1.38.  Her baseline is between 1.2 and 1.4 I will add gentle IV hydration and recheck in the morning  6.  History of chronic systolic dysfunction heart failure current EF of 55 to 60% she has had EF as low as 35%  with dilated cardiomyopathy patient is well compensated at this time.  We will continue her IV Lasix  7.  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 Conversing appropriately  8.  Paroxysmal atrial fibrillation we will continue Eliquis - rate controlled   9. severe osteoarthritis.  Patient is being considered for left knee replacement.  But due to her cellulitis it skin be recommended to hold it until she is completely healed -Stable at present  10.  Neutropenia. Likely in the setting of use of colchicine as well as active infection. Colchicine had been discontinued WBC currently stable. Repeat cbc in AM   DVT prophylaxis: eliquis Code Status: Full Family Communication: Pt in room, family over phone Disposition Plan: Home with home health anticipated in 24hrs  Consultants:     Procedures:     Antimicrobials: Anti-infectives (From admission, onward)   Start     Dose/Rate Route Frequency Ordered Stop   02/17/18 0000  cephALEXin (KEFLEX) 500 MG capsule     500 mg Oral 3 times daily 02/17/18 0748 02/19/18 2359   02/16/18 2000  cephALEXin (KEFLEX) capsule 500  mg     500 mg Oral Every 8 hours 02/16/18 0954     02/15/18 1400  vancomycin (VANCOCIN) IVPB 1000 mg/200 mL premix  Status:  Discontinued     1,000 mg 200 mL/hr over 60 Minutes Intravenous Every 48 hours 02/14/18 0939 02/16/18 0835   02/14/18 0700  vancomycin (VANCOCIN) 1,500 mg in sodium chloride 0.9 % 500 mL IVPB  Status:  Discontinued     1,500 mg 250 mL/hr over  120 Minutes Intravenous Every 36 hours 02/12/18 1735 02/13/18 1124   02/13/18 1200  vancomycin (VANCOCIN) 1,500 mg in sodium chloride 0.9 % 500 mL IVPB  Status:  Discontinued     1,500 mg 250 mL/hr over 120 Minutes Intravenous Every 36 hours 02/13/18 1124 02/14/18 0939   02/12/18 1930  cefTRIAXone (ROCEPHIN) 1 g in sodium chloride 0.9 % 100 mL IVPB  Status:  Discontinued     1 g 200 mL/hr over 30 Minutes Intravenous Every 24 hours 02/12/18 1836 02/16/18 0953   02/12/18 1615  vancomycin (VANCOCIN) 1,500 mg in sodium chloride 0.9 % 500 mL IVPB  Status:  Discontinued     1,500 mg 250 mL/hr over 120 Minutes Intravenous  Once 02/12/18 1608 02/13/18 1124       Subjective: Eager to start physical therapy  Objective: Vitals:   02/18/18 0012 02/18/18 0403 02/18/18 0913 02/18/18 1324  BP: 126/63 138/67 (!) 150/76 122/62  Pulse: 76 66 61 66  Resp: 18 16 20    Temp: 98.4 F (36.9 C) 97.9 F (36.6 C) 97.6 F (36.4 C) 98.3 F (36.8 C)  TempSrc: Oral   Oral  SpO2: 94% 100% 98% 100%  Weight:   78.9 kg   Height:        Intake/Output Summary (Last 24 hours) at 02/18/2018 1654 Last data filed at 02/18/2018 1046 Gross per 24 hour  Intake 240 ml  Output 950 ml  Net -710 ml   Filed Weights   02/12/18 1032 02/12/18 1833 02/18/18 0913  Weight: 90.7 kg 79.9 kg 78.9 kg    Examination:  General exam: Appears calm and comfortable  Respiratory system: Clear to auscultation. Respiratory effort normal. Cardiovascular system: S1 & S2 heard, RRR Gastrointestinal system: Abdomen is nondistended, soft and nontender. No organomegaly or masses felt. Normal bowel sounds heard. Central nervous system: Alert and oriented. No focal neurological deficits. Extremities: Symmetric 5 x 5 power. Skin: No rashes, lesions  Psychiatry: Judgement and insight appear normal. Mood & affect appropriate.   Data Reviewed: I have personally reviewed following labs and imaging studies  CBC: Recent Labs  Lab  02/12/18 1246 02/14/18 0242 02/16/18 0510 02/17/18 0758  WBC 4.1 3.2* 2.8* 2.9*  NEUTROABS 2.6  --  1.4* 1.2*  HGB 10.1* 9.1* 9.2* 9.9*  HCT 32.9* 30.0* 30.5* 32.5*  MCV 96.5 96.8 95.9 94.5  PLT 245 249 243 505   Basic Metabolic Panel: Recent Labs  Lab 02/12/18 1246 02/14/18 0242 02/15/18 1911  NA 139 140 139  K 3.8 3.7 3.7  CL 104 106 105  CO2 27 26 29   GLUCOSE 99 124* 111*  BUN 21 19 22   CREATININE 1.38* 1.48* 1.19*  CALCIUM 10.0 9.2 9.3   GFR: Estimated Creatinine Clearance: 36.7 mL/min (A) (by C-G formula based on SCr of 1.19 mg/dL (H)). Liver Function Tests: Recent Labs  Lab 02/14/18 0242  AST 23  ALT 14  ALKPHOS 58  BILITOT 0.4  PROT 6.0*  ALBUMIN 3.0*   No results for input(s): LIPASE, AMYLASE  in the last 168 hours. No results for input(s): AMMONIA in the last 168 hours. Coagulation Profile: No results for input(s): INR, PROTIME in the last 168 hours. Cardiac Enzymes: No results for input(s): CKTOTAL, CKMB, CKMBINDEX, TROPONINI in the last 168 hours. BNP (last 3 results) No results for input(s): PROBNP in the last 8760 hours. HbA1C: No results for input(s): HGBA1C in the last 72 hours. CBG: No results for input(s): GLUCAP in the last 168 hours. Lipid Profile: No results for input(s): CHOL, HDL, LDLCALC, TRIG, CHOLHDL, LDLDIRECT in the last 72 hours. Thyroid Function Tests: No results for input(s): TSH, T4TOTAL, FREET4, T3FREE, THYROIDAB in the last 72 hours. Anemia Panel: No results for input(s): VITAMINB12, FOLATE, FERRITIN, TIBC, IRON, RETICCTPCT in the last 72 hours. Sepsis Labs: No results for input(s): PROCALCITON, LATICACIDVEN in the last 168 hours.  Recent Results (from the past 240 hour(s))  MRSA PCR Screening     Status: None   Collection Time: 02/13/18  8:48 AM  Result Value Ref Range Status   MRSA by PCR NEGATIVE NEGATIVE Final    Comment:        The GeneXpert MRSA Assay (FDA approved for NASAL specimens only), is one component of  a comprehensive MRSA colonization surveillance program. It is not intended to diagnose MRSA infection nor to guide or monitor treatment for MRSA infections. Performed at [facility], [place] [address], [place], [place] [address]      Radiology Studies: No results found.  Scheduled Meds: . apixaban  5 mg Oral BID  . buPROPion  450 mg Oral Daily  . calcium-vitamin D  1 tablet Oral BID  . carvedilol  12.5 mg Oral BID WC  . cephALEXin  500 mg Oral Q8H  . cholecalciferol  1,000 Units Oral Daily  . diclofenac sodium  2 g Topical QID  . docusate sodium  100 mg Oral Daily  . donepezil  10 mg Oral QHS  . furosemide  20 mg Oral Daily  . gabapentin  300 mg Oral BID  . hydrALAZINE  25 mg Oral Q8H  . iron polysaccharides  150 mg Oral Daily  . losartan  50 mg Oral Daily  . multivitamin with minerals  1 tablet Oral Daily  . MUSCLE RUB  1 application Topical TID  . pantoprazole  40 mg Oral Daily  . saccharomyces boulardii  250 mg Oral BID  . sertraline  12.5 mg Oral BID  . vitamin B-12  2,000 mcg Oral Daily   Continuous Infusions:   LOS: 5 days   Marylu Lund, MD [facility] Pager On Amion  If 7PM-7AM, please contact night-coverage 02/18/2018, 4:54 PM

## 2018-02-18 NOTE — Progress Notes (Addendum)
This CM, At[place], RN with [facility], CSW all in pt's room with pt's daughter [name] [phone] on cell concerning pt's discharge needs and plan for discharge. Pt was denied SNF/insurance co. Pt need 24/7 care, however pt's family is unavailable. Pt live in ALF and states that she will hire a sitter for hours needed. [facility] will provide through [facility] [place]. Pt was with [facility] and asked to continue. Pt also asked for a WC, which was ordered from [facility]. Pt and daughter states that they will pay for Private duty sitters. Pt states that she know someone and will call today.

## 2018-02-18 NOTE — Progress Notes (Signed)
Patient was identified by her health plan as high risk for readmission and mortality. I have completed an extensive chart review and believe that patient and her daughters would greatly benefit from a complete goals of care discussion that includes: advance care planning, she needs a MOST form, disease trajectory and also a discussion about falls, frailty and prognostication. Given her current condition a goals conversation would certainly be needed prior to any surgical procedure, especially knee replacement given her high risk for 12 month mortality based on her predictive score.   -consider inpatient or outpatient referral for palliative care consultation for goals of care, she also struggles with spinal stenosis and lower extremity neuro-claudication pain  -needs advance care planning and illness trajectory preparation and goals for EOL/QOL.   Lane Hacker, DO Palliative Medicine

## 2018-02-18 NOTE — Progress Notes (Signed)
Physical Therapy Treatment Patient Details Name: Susan [name] MRN: [id number] DOB: [birth year] Today's Date: 02/18/2018    History of Present Illness 78 y.o. female with medical history significant of prior CVA, hypertension, hyperlipidemia, paroxysmal A. fib on chronic anticoagulation with Eliquis, spinal stenosis, vascular dementia, chronic back pain who presents to the hospital with chief complaint of having a fall as well as left lower extremity pain and admitted for recurrent left lower extremity cellulitis    PT Comments    Per rounding MD and case manager, pt unable to return to SNF and will return to ILF upon d/c which is likely today.  Pt educated on transfer techniques for improved self assist.  Pt continues to require at least min assist for steadying with transfers so recommended mobility only when someone available to assist for safety and to practice with family assisting during PT sessions at home.  Gait belt provided for pt to use at home.  Recommended pt not attempt to ambulate with RW alone (which she seemed shocked but agreeable even though she had just transferred to recliner with assist and was unsteady).  Recommend 24/7 supervision/assist for safety upon d/c.  Pt reports she has RW and w/c at home.   Follow Up Recommendations  Home health PT;Supervision/Assistance - 24 hour(recommend SNF however pt unable to return and will d/c home (ILF))     Equipment Recommendations  None recommended by PT    Recommendations for Other Services       Precautions / Restrictions Precautions Precautions: Fall Precaution Comments: word finding difficulty (improved today)    Mobility  Bed Mobility Overal bed mobility: Needs Assistance Bed Mobility: Supine to Sit     Supine to sit: Supervision     General bed mobility comments: from flat bed as pt unable to d/c to SNF and will return home, increased time and effort  Transfers Overall transfer level: Needs assistance Equipment  used: Rolling walker (2 wheeled) Transfers: Sit to/from [facility] Sit to Stand: Min assist Stand pivot transfers: Min assist       General transfer comment: verbal cues for technique ("nose over toes" "lock knees" "trunk upright"), pt tends to lose balance posteriorly requiring assist; cues to find balance and "march in place" prior to initiating transfers at home (so pt has surface behind her to return to sitting if loses balance), assist to steady with transfer and cues for wider BOS (for stability)  Ambulation/Gait                 Stairs             Wheelchair Mobility    Modified Rankin (Stroke Patients Only)       Balance                                            Cognition Arousal/Alertness: Awake/alert Behavior During Therapy: WFL for tasks assessed/performed Overall Cognitive Status: History of cognitive impairments - at baseline                                 General Comments: likely at baseline however no family present      Exercises      General Comments        Pertinent Vitals/Pain Pain Assessment: No/denies pain    Home  Living                      Prior Function            PT Goals (current goals can now be found in the care plan section) Progress towards PT goals: Progressing toward goals    Frequency    Min 3X/week      PT Plan Discharge plan needs to be updated;Frequency needs to be updated    Co-evaluation              AM-PAC PT "6 Clicks" Mobility   Outcome Measure  Help needed turning from your back to your side while in a flat bed without using bedrails?: A Little Help needed moving from lying on your back to sitting on the side of a flat bed without using bedrails?: A Little Help needed moving to and from a bed to a chair (including a wheelchair)?: A Lot Help needed standing up from a chair using your arms (e.g., wheelchair or bedside chair)?: A  Lot Help needed to walk in hospital room?: Total Help needed climbing 3-5 steps with a railing? : Total 6 Click Score: 12    End of Session Equipment Utilized During Treatment: Gait belt Activity Tolerance: Patient tolerated treatment well Patient left: in chair;with call bell/phone within reach;with chair alarm set Nurse Communication: Mobility status PT Visit Diagnosis: Muscle weakness (generalized) (M62.81)     Time: 3354-5625 PT Time Calculation (min) (ACUTE ONLY): 20 min  Charges:  $Therapeutic Activity: 8-22 mins                     Carmelia Bake, PT, DPT Acute Rehabilitation Services Office: [phone] Pager: [phone]  Trena Platt 02/18/2018, 1:05 PM

## 2018-02-18 NOTE — Progress Notes (Signed)
    Durable Medical Equipment  (From admission, onward)         Start     Ordered   02/18/18 1427  For home use only DME lightweight manual wheelchair with seat cushion  Once    Comments:  Patient suffers from Cellulitis, TIA, CVA, HTN, which impairs their ability to perform daily activities like walking, basic care in the home.  A walker will not resolve  issue with performing activities of daily living. A wheelchair will allow patient to safely perform daily activities. Patient is not able to propel themselves in the home using a standard weight wheelchair due to weakness, decline in health. Patient can self propel in the lightweight wheelchair.  Accessories: elevating leg rests (ELRs), wheel locks, extensions and anti-tippers.   02/18/18 1429

## 2018-02-18 NOTE — Consult Note (Signed)
   [facility] CM Inpatient Consult   02/18/2018  AHNESTY FINFROCK 1940-11-10 [id number]   Received telephone call from inpatient RNCM indicating HTA insurance denied SNF stay. Request made for [facility] Care Management to follow up.  Received approval from [place] Management Director to utilize [facility] for aide assistance. Telephone call to [facility] with [facility] to make aware of the referral.  Went to bedside to speak with Mrs.Tortorelli about [place] Management program in conjunction with [facility] assistance. Mrs. Verrill states she needs to call her daughter, [name], for Probation officer to further discuss.  Patient called [name] (daughter) on her speaker phone for writer to explain [place] Management program services and [facility] assistance with aide assistance. [name] had additional questions regarding SNF denial and other discharge planning questions. Inpatient RNCM and inpatient LCSW came to bedside to help answer questions from daughter.   Concerns were expressed by [name] (daughter) and patient that Mrs. Graciano lives alone in independent living apartment. [name] actually lives in [place] and the other daughters are Veterinary surgeon and live locally.The other daughters both work during the day. It was determined that patient needs 24/7 assistance at home. [name] stated she will try to arrange this. In the meantime, patient and daughter are agreeable to [facility] Management follow up and the assistance of [facility].   [place] with [facility] also spoke with patient and daughter on the phone to discuss aide assistance for at least 2 hrs for 17 visits. Explained that this would be a temporary.  Spent over 1hour with patient and daughter in efforts in assisting with discharge planning.  Will continue to follow and make appropriate [facility] referrals upon discharge. Will keep [facility] with [facility] updated on discharge date.   Marthenia Rolling, MSN-Ed, RN,BSN [facility] Liaison [phone]

## 2018-02-18 NOTE — Care Management Note (Signed)
Case Management Note  Patient Details  Name: Susan [name] MRN: [id number] Date of Birth: November 27, 1940  Subjective/Objective:                    Action/Plan:Referral given to [facility] for [facility] at home when pt is discharged. Pt used all of her SNF days/HH co pay is $40/visit, pt can not afford.    Expected Discharge Date:  02/17/18               Expected Discharge Plan:  [place]  In-House Referral:  [facility]  Discharge planning Services  CM Consult  Post Acute Care Choice:    Choice offered to:     DME Arranged:    DME Agency:     HH Arranged:  RN [place] Agency:  Other - See comment([facility])  Status of Service:  Completed, signed off  If discussed at [place] of Stay Meetings, dates discussed:    Additional Comments:  Purcell Mouton, RN 02/18/2018, 1:25 PM

## 2018-02-18 NOTE — Progress Notes (Signed)
Request for SNF admission coverage for rehab denied by insurance [facility]). Discussed extensively with pt and daughter Olivia Mackie via phone)- pt and daughter working to arrange private caregivers at home ([facility]) and CM arranging home health [facility] is pt's preference).  Pt also agreed to [facility] care management services as well.  Sharren Bridge, MSW, LCSW Clinical Social Work 02/18/2018 [phone]

## 2018-02-19 NOTE — Progress Notes (Addendum)
Received call from [facility] stating that staff is coming to hospital to assess pt to see if she can return there for respite while arrangements are being made to have pt support set up at her home ([facility] independent [facility]).  Sharren Bridge, MSW, LCSW Clinical Social Work 02/19/2018 [phone]  Spoke with staff- ALF staff en route to assess pt today. CSW will follow  15:18- [facility] staff Kenney Houseman reports facility accepting pt for respite stay. Is working on admission paperwork this afternoon. (Had assessed her earlier this week and declined her, thus new admission in process now). Provided preliminary FL2 to facility (will need DC meds and provider signature at pt's DC) in order to facilitate process.

## 2018-02-19 NOTE — Progress Notes (Signed)
PROGRESS NOTE    Susan [name]  ACZ:[id number] DOB: November 15, 1940 DOA: 02/12/2018 PCP: Lauree Chandler, NP    Brief Narrative:  78 y.o.femalewith medical history significant ofprior CVA, hypertension, hyperlipidemia, paroxysmal A. fib on chronic anticoagulation with Eliquis, spinal stenosis, who presents to the hospital with chief complaint of having a fall as well as left lower extremity pain. Patient has been dealing with left lower extremity cellulitis for the past 2 months. She was hospitalized twice in December 2019, received a course of intravenous antibiotics in the hospital, she was transitioned to p.o., discharged and upon finishing up the p.o. antibiotics for cellulitis recurred. Most recent discharge was at the end of December 2019, she had antibiotics for a few days at her SNF but she has now been off her antibiotics for the past 3 to 4 weeks. For the last week, she has noticed increased redness and swelling of the left lower extremity, as well as increased pain.   Assessment & Plan:   Principal Problem:   Cellulitis Active Problems:   TIA (transient ischemic attack)   History of CVA (cerebrovascular accident)   HTN (hypertension)   1.  Recurrent left left lower extremity cellulitis.  Patient initially had been on vancomycin and ceftriaxone, now on PO keflex with anticipation to complete tx on 2/13. Stable at present  2.  Elevated uric acid likely gout ,uric acid is 7.2.  initially started her on colchicine. Discontinued colchicine as it is most likely the cause for patient's neutropenia.  3.  History of CVA with dysarthria.  Patient has increased fall risk she was admitted from a assisted living facility.  PT/ OT was consulted and they both recommend skilled nursing facility.  Social worker consult has been put in for evaluation for skilled nursing placement still waiting for social worker consult.[facility] Speech therapy evaluated and recommended skilled facility and speech  therapy. Plan for facility placement 2/14 per SW  4.  Hypertension -BP stable at this time -Currently on coreg, lasix, losartan  5.  Acute on chronic kidney failure stage III.  Patient is close to her baseline creatinine today is 1.43 yesterday was 1.38.  Her baseline is between 1.2 and 1.4 I will add gentle IV hydration and recheck in the morning  6.  History of chronic systolic dysfunction heart failure current EF of 55 to 60% she has had EF as low as 35%  with dilated cardiomyopathy patient is well compensated at this time.  We will continue her IV Lasix  7.  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 Conversing appropriately  8.  Paroxysmal atrial fibrillation we will continue Eliquis - rate controlled   9. severe osteoarthritis.  Patient is being considered for left knee replacement.  But due to her cellulitis it skin be recommended to hold it until she is completely healed -Stable at present  10.  Neutropenia. Likely in the setting of use of colchicine as well as active infection. Colchicine had been discontinued Stable at present   DVT prophylaxis: eliquis Code Status: Full Family Communication: Pt in room, family over phone Disposition Plan: discharge anticipated 24hrs  Consultants:     Procedures:     Antimicrobials: Anti-infectives (From admission, onward)   Start     Dose/Rate Route Frequency Ordered Stop   02/17/18 0000  cephALEXin (KEFLEX) 500 MG capsule     500 mg Oral 3 times daily 02/17/18 0748 02/19/18 2359   02/16/18 2000  cephALEXin (KEFLEX)  capsule 500 mg     500 mg Oral Every 8 hours 02/16/18 0954     02/15/18 1400  vancomycin (VANCOCIN) IVPB 1000 mg/200 mL premix  Status:  Discontinued     1,000 mg 200 mL/hr over 60 Minutes Intravenous Every 48 hours 02/14/18 0939 02/16/18 0835   02/14/18 0700  vancomycin (VANCOCIN) 1,500 mg in sodium chloride 0.9 % 500 mL IVPB  Status:  Discontinued     1,500  mg 250 mL/hr over 120 Minutes Intravenous Every 36 hours 02/12/18 1735 02/13/18 1124   02/13/18 1200  vancomycin (VANCOCIN) 1,500 mg in sodium chloride 0.9 % 500 mL IVPB  Status:  Discontinued     1,500 mg 250 mL/hr over 120 Minutes Intravenous Every 36 hours 02/13/18 1124 02/14/18 0939   02/12/18 1930  cefTRIAXone (ROCEPHIN) 1 g in sodium chloride 0.9 % 100 mL IVPB  Status:  Discontinued     1 g 200 mL/hr over 30 Minutes Intravenous Every 24 hours 02/12/18 1836 02/16/18 0953   02/12/18 1615  vancomycin (VANCOCIN) 1,500 mg in sodium chloride 0.9 % 500 mL IVPB  Status:  Discontinued     1,500 mg 250 mL/hr over 120 Minutes Intravenous  Once 02/12/18 1608 02/13/18 1124      Subjective: Feeling well. Eager to start therapy  Objective: Vitals:   02/19/18 0212 02/19/18 0451 02/19/18 1505 02/19/18 1505  BP: (!) 142/50 (!) 141/69 (!) 150/68 (!) 150/68  Pulse: (!) 118 64 64 63  Resp: 16 16 18    Temp: 97.9 F (36.6 C) (!) 97.5 F (36.4 C) 99 F (37.2 C) 99 F (37.2 C)  TempSrc: Oral Oral Oral Oral  SpO2: 100% 97% 98% 99%  Weight:      Height:        Intake/Output Summary (Last 24 hours) at 02/19/2018 1533 Last data filed at 02/19/2018 1100 Gross per 24 hour  Intake 240 ml  Output 1800 ml  Net -1560 ml   Filed Weights   02/12/18 1032 02/12/18 1833 02/18/18 0913  Weight: 90.7 kg 79.9 kg 78.9 kg    Examination: General exam: Awake, laying in bed, in nad Respiratory system: Normal respiratory effort, no wheezing Cardiovascular system: regular rate, s1, s2 Gastrointestinal system: Soft, nondistended, positive BS Central nervous system: CN2-12 grossly intact, strength intact Extremities: Perfused, no clubbing Skin: Normal skin turgor, no notable skin lesions seen Psychiatry: Mood normal // no visual hallucinations   Data Reviewed: I have personally reviewed following labs and imaging studies  CBC: Recent Labs  Lab 02/14/18 0242 02/16/18 0510 02/17/18 0758  WBC 3.2* 2.8*  2.9*  NEUTROABS  --  1.4* 1.2*  HGB 9.1* 9.2* 9.9*  HCT 30.0* 30.5* 32.5*  MCV 96.8 95.9 94.5  PLT 249 243 144   Basic Metabolic Panel: Recent Labs  Lab 02/14/18 0242 02/15/18 1911  NA 140 139  K 3.7 3.7  CL 106 105  CO2 26 29  GLUCOSE 124* 111*  BUN 19 22  CREATININE 1.48* 1.19*  CALCIUM 9.2 9.3   GFR: Estimated Creatinine Clearance: 36.7 mL/min (A) (by C-G formula based on SCr of 1.19 mg/dL (H)). Liver Function Tests: Recent Labs  Lab 02/14/18 0242  AST 23  ALT 14  ALKPHOS 58  BILITOT 0.4  PROT 6.0*  ALBUMIN 3.0*   No results for input(s): LIPASE, AMYLASE in the last 168 hours. No results for input(s): AMMONIA in the last 168 hours. Coagulation Profile: No results for input(s): INR, PROTIME in the last 168 hours.  Cardiac Enzymes: No results for input(s): CKTOTAL, CKMB, CKMBINDEX, TROPONINI in the last 168 hours. BNP (last 3 results) No results for input(s): PROBNP in the last 8760 hours. HbA1C: No results for input(s): HGBA1C in the last 72 hours. CBG: No results for input(s): GLUCAP in the last 168 hours. Lipid Profile: No results for input(s): CHOL, HDL, LDLCALC, TRIG, CHOLHDL, LDLDIRECT in the last 72 hours. Thyroid Function Tests: No results for input(s): TSH, T4TOTAL, FREET4, T3FREE, THYROIDAB in the last 72 hours. Anemia Panel: No results for input(s): VITAMINB12, FOLATE, FERRITIN, TIBC, IRON, RETICCTPCT in the last 72 hours. Sepsis Labs: No results for input(s): PROCALCITON, LATICACIDVEN in the last 168 hours.  Recent Results (from the past 240 hour(s))  MRSA PCR Screening     Status: None   Collection Time: 02/13/18  8:48 AM  Result Value Ref Range Status   MRSA by PCR NEGATIVE NEGATIVE Final    Comment:        The GeneXpert MRSA Assay (FDA approved for NASAL specimens only), is one component of a comprehensive MRSA colonization surveillance program. It is not intended to diagnose MRSA infection nor to guide or monitor treatment for MRSA  infections. Performed at [facility], [place] [address], [place], [place] [address]      Radiology Studies: No results found.  Scheduled Meds: . apixaban  5 mg Oral BID  . buPROPion  450 mg Oral Daily  . calcium-vitamin D  1 tablet Oral BID  . carvedilol  12.5 mg Oral BID WC  . cephALEXin  500 mg Oral Q8H  . cholecalciferol  1,000 Units Oral Daily  . diclofenac sodium  2 g Topical QID  . docusate sodium  100 mg Oral Daily  . donepezil  10 mg Oral QHS  . furosemide  20 mg Oral Daily  . gabapentin  300 mg Oral BID  . hydrALAZINE  25 mg Oral Q8H  . iron polysaccharides  150 mg Oral Daily  . losartan  50 mg Oral Daily  . multivitamin with minerals  1 tablet Oral Daily  . MUSCLE RUB  1 application Topical TID  . pantoprazole  40 mg Oral Daily  . saccharomyces boulardii  250 mg Oral BID  . sertraline  12.5 mg Oral BID  . vitamin B-12  2,000 mcg Oral Daily   Continuous Infusions:   LOS: 6 days   Marylu Lund, MD [facility] Pager On Amion  If 7PM-7AM, please contact night-coverage 02/19/2018, 3:33 PM

## 2018-02-19 NOTE — NC FL2 (Addendum)
[place] LEVEL OF CARE SCREENING TOOL     IDENTIFICATION  Patient Name: Susan [name] Birthdate: [birth year] Sex: female Admission Date (Current Location): 02/12/2018  [facility] and [place] Number:  Herbalist and Address:  [facility],  [place], [place]      Provider Number: [id number]  Attending Physician Name and Address:  Donne Hazel, MD  Relative Name and Phone Number:  [name]: [phone]    Current Level of Care: Hospital Recommended Level of Care: [place] Prior Approval Number:    Date Approved/Denied:   PASRR Number: [id number]  Discharge Plan: Other (Comment)(Assisted living)    Current Diagnoses: Patient Active Problem List   Diagnosis Date Noted  . Cellulitis 02/12/2018  . HTN (hypertension) 02/12/2018  . Cellulitis and abscess of leg 12/29/2017  . CKD (chronic kidney disease) stage 3, GFR 30-59 ml/min (HCC) 12/29/2017  . Cellulitis of left lower extremity   . Ankle edema   . Sepsis ([place]) 12/21/2017  . Medication management 10/11/2016  . Hyperlipemia 06/18/2016  . Gait abnormality 06/18/2016  . Major depressive disorder, recurrent episode, severe ([place]) 11/21/2015  . Edema 02/09/2015  . History of CVA (cerebrovascular accident) 02/01/2015  . 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 Chronic low back pain 01/28/2015  . Short-term memory loss 01/28/2015  . Volume depletion 01/28/2015  . Persistent atrial fibrillation 01/26/2015  . DCM (dilated cardiomyopathy) ([place]) 01/26/2015  . TIA (transient ischemic attack) 01/24/2015  . Chronic diastolic (congestive) heart failure ([place])   . Obesity (BMI 30-39.9) 01/27/2013  . Constipation 09/02/2012  . H/O arthroscopy of right knee 08/06/2012  . Anxiety   . Depression   . GERD (gastroesophageal reflux disease)   . Hypertensive heart disease   . OSA  (obstructive sleep apnea)   . Osteoarthritis of right knee   . Hyperparathyroidism, primary ([place]) 04/22/2012  . Chronic cough 10/02/2011    Orientation RESPIRATION BLADDER Height & Weight     Self, Time, Situation, Place  Normal External catheter, Continent Weight: 173 lb 15.1 oz (78.9 kg) Height:  5\' 2"  (157.5 cm)  BEHAVIORAL SYMPTOMS/MOOD NEUROLOGICAL BOWEL NUTRITION STATUS      Continent Diet(no added salt)  AMBULATORY STATUS COMMUNICATION OF NEEDS Skin   Extensive Assist Verbally Normal                       Personal Care Assistance Level of Assistance  Bathing, Feeding, Dressing Bathing Assistance: Maximum assistance Feeding assistance: Independent Dressing Assistance: Maximum assistance     Functional Limitations Info  Sight, Hearing, Speech Sight Info: Adequate Hearing Info: Adequate Speech Info: Adequate    SPECIAL CARE FACTORS FREQUENCY  PT (By licensed PT), OT (By licensed OT)     PT Frequency: 2x HHPT OT Frequency: 2x HHOT            Contractures Contractures Info: Not present    Additional Factors Info  Code Status, Allergies, Psychotropic Code Status Info: full code Allergies Info:  Ace Inhibitors, Lipitor Atorvastatin, Simvastatin, Latex Psychotropic Info: Wellbutrin, Zoloft, PRN Ativan         Current Medications (02/19/2018):   TAKE these medications       acetaminophen 500 MG tablet Commonly known as:  TYLENOL Take 1,000 mg by mouth daily.   apixaban 5 MG Tabs tablet Commonly known as:  ELIQUIS Take 1  tablet (5 mg total) by mouth every 12 (twelve) hours.   ASPERCREME LIDOCAINE 4 % Ptch Generic drug:  Lidocaine Apply 1 patch topically daily.   buPROPion 200 MG 12 hr tablet Commonly known as:  WELLBUTRIN SR Take 200 mg by mouth daily.   Calcium Carbonate-Vitamin D 600-400 MG-UNIT tablet Take 1 tablet by mouth 2 (two) times daily. What changed:  when to take this   carvedilol 12.5 MG tablet Commonly known as:   COREG Take 1 tablet (12.5 mg total) by mouth 2 (two) times daily with a meal.   cholecalciferol 25 MCG (1000 UT) tablet Commonly known as:  VITAMIN D Take 1,000 Units by mouth daily.   cyclobenzaprine 5 MG tablet Commonly known as:  FLEXERIL Take 5 mg by mouth 3 (three) times daily.   diclofenac sodium 1 % Gel Commonly known as:  VOLTAREN Apply 2 g topically 4 (four) times daily.   docusate sodium 100 MG capsule Commonly known as:  COLACE Take 100 mg by mouth 2 (two) times daily.   donepezil 10 MG tablet Commonly known as:  ARICEPT Take 1 tablet (10 mg total) by mouth at bedtime. For memory loss   furosemide 20 MG tablet Commonly known as:  LASIX Take 20 mg by mouth daily.   gabapentin 300 MG capsule Commonly known as:  NEURONTIN Take 600 mg by mouth 2 (two) times daily.   hydrALAZINE 25 MG tablet Commonly known as:  APRESOLINE Take 1 tablet (25 mg total) by mouth 3 (three) times daily.   IFEREX 150 150 MG capsule Generic drug:  iron polysaccharides Take 150 mg by mouth daily.   losartan 50 MG tablet Commonly known as:  COZAAR Take 1 tablet (50 mg total) by mouth daily.   Menthol (Topical Analgesic) 4 % Gel Commonly known as:  BIOFREEZE Apply 3 oz topically 3 (three) times daily. For knee pain   multivitamin with minerals Tabs tablet Take 1 tablet by mouth daily.   pantoprazole 40 MG tablet Commonly known as:  PROTONIX Take 40 mg by mouth daily.   pravastatin 40 MG tablet Commonly known as:  PRAVACHOL Take 40 mg by mouth daily.   saccharomyces boulardii 250 MG capsule Commonly known as:  FLORASTOR Take 1 capsule (250 mg total) by mouth 2 (two) times daily.   sertraline 25 MG tablet Commonly known as:  ZOLOFT Take 0.5 tablets (12.5 mg total) by mouth 2 (two) times daily.   vitamin B-12 1000 MCG tablet Commonly known as:  CYANOCOBALAMIN Take 2,000 mcg by mouth daily.             ASK your doctor about these medications        cephALEXin 500 MG capsule Commonly known as:  KEFLEX Take 1 capsule (500 mg total) by mouth 3 (three) times daily for 2 days. Ask about: Should I take this medication      Discharge Medications: Please see discharge summary for a list of discharge medications.  Relevant Imaging Results:  Relevant Lab Results:   Additional Information SSN: [id number]  Nila Nephew, LCSW

## 2018-02-19 NOTE — Progress Notes (Signed)
Occupational Therapy Treatment Patient Details Name: Susan [name] MRN: [id number] DOB: 1940/12/05 Today's Date: 02/19/2018    History of present illness 78 y.o. female with medical history significant of prior CVA, hypertension, hyperlipidemia, paroxysmal A. fib on chronic anticoagulation with Eliquis, spinal stenosis, vascular dementia, chronic back pain who presents to the hospital with chief complaint of having a fall as well as left lower extremity pain and admitted for recurrent left lower extremity cellulitis   OT comments  Pt progressing towards OT goals this session. Pt able to perform sit<>stand x10 progressing from initial mod A to min guard from recliner. Pt then able to perform short in room mobility to [facility]. Pt is eager to participate with therapy and work and get as strong as possible to return to independence. Also discussed energy conservation and safety in kitchen as Pt typically cooks for herself. OT will continue to follow acutely.   Follow Up Recommendations  SNF    Equipment Recommendations  None recommended by OT    Recommendations for Other Services      Precautions / Restrictions Precautions Precautions: Fall Restrictions Weight Bearing Restrictions: No       Mobility Bed Mobility               General bed mobility comments: OOB at beginning and end of session  Transfers Overall transfer level: Needs assistance Equipment used: Rolling walker (2 wheeled) Transfers: Sit to/from Stand Sit to Stand: Mod assist;Min guard         General transfer comment: vc for technique and was able to improve from initial mod A for sit<>Stand to min guard from recliner.     Balance Overall balance assessment: Needs assistance;History of Falls Sitting-balance support: No upper extremity supported;Feet supported Sitting balance-Leahy Scale: Good     Standing balance support: Bilateral upper extremity supported;Single extremity supported Standing balance-Leahy  Scale: Poor(approaching fair) Standing balance comment: dependent on RW for dynamic mobility. Able to perform SUE standing balance activities this session                           ADL either performed or assessed with clinical judgement   ADL Overall ADL's : Needs assistance/impaired                         Toilet Transfer: Moderate assistance;Min guard;BSC;RW;Requires Estate agent Details (indicate cue type and reason): Pt able to progress from mod A +1 for sit <>stand to min guard from recliner         Functional mobility during ADLs: Min guard;Rolling walker General ADL Comments: sit <>stand performed x10 from recliner, followed by a short walk in room to [facility].     Vision       Perception     Praxis      Cognition Arousal/Alertness: Awake/alert Behavior During Therapy: WFL for tasks assessed/performed Overall Cognitive Status: History of cognitive impairments - at baseline                                          Exercises     Shoulder Instructions       General Comments Pt is EAGER and wants to work with therapy    Pertinent Vitals/ Pain       Pain Assessment: No/denies pain Pain Intervention(s): Monitored during session;Repositioned  Home Living                                          Prior Functioning/Environment              Frequency  Min 2X/week        Progress Toward Goals  OT Goals(current goals can now be found in the care plan section)  Progress towards OT goals: Progressing toward goals  Acute Rehab OT Goals Patient Stated Goal: get as strong and safe as possible and get to rehab OT Goal Formulation: With patient Time For Goal Achievement: 02/27/18 Potential to Achieve Goals: Good  Plan Discharge plan remains appropriate    Co-evaluation                 AM-PAC OT "6 Clicks" Daily Activity     Outcome Measure   Help from another person eating  meals?: A Little Help from another person taking care of personal grooming?: A Little Help from another person toileting, which includes using toliet, bedpan, or urinal?: A Lot Help from another person bathing (including washing, rinsing, drying)?: A Lot Help from another person to put on and taking off regular upper body clothing?: A Lot Help from another person to put on and taking off regular lower body clothing?: A Lot 6 Click Score: 14    End of Session Equipment Utilized During Treatment: Gait belt;Rolling walker  OT Visit Diagnosis: Unsteadiness on feet (R26.81);Muscle weakness (generalized) (M62.81);Other abnormalities of gait and mobility (R26.89)   Activity Tolerance Patient tolerated treatment well   Patient Left in chair;with call bell/phone within reach;with chair alarm set   Nurse Communication Mobility status;Other (comment)(no pure wick)        Time: 7092-9574 OT Time Calculation (min): 25 min  Charges: OT General Charges $OT Visit: 1 Visit OT Treatments $Self Care/Home Management : 8-22 mins $Therapeutic Activity: 8-22 mins Hulda Humphrey OTR/L Acute Rehabilitation Services Pager: [phone] Office: [place] 02/19/2018, 3:05 PM

## 2018-02-19 NOTE — Care Management Important Message (Signed)
Important Message  Patient Details  Name: Susan [name] MRN: [id number] Date of Birth: 1940/11/16   Medicare Important Message Given:  Yes    Kerin Salen 02/19/2018, 10:11 AMImportant Message  Patient Details  Name: Susan [name] MRN: [id number] Date of Birth: 1940/03/04   Medicare Important Message Given:  Yes    Kerin Salen 02/19/2018, 10:11 AM

## 2018-02-20 ENCOUNTER — Telehealth: Payer: Self-pay | Admitting: *Deleted

## 2018-02-20 NOTE — Telephone Encounter (Signed)
I have made the 1st attempt to contact the patient or family member in charge, in order to follow up from recently being discharged from the hospital. I left a message on voicemail but I will make another attempt at a different time.  

## 2018-02-20 NOTE — Progress Notes (Signed)
Patient is stable for discharge. AVS and prescriptions given to [facility] staff. Report called to [place] at [place]. PT[facility] has left with the patient.

## 2018-02-20 NOTE — Progress Notes (Signed)
Patient is set to discharge to [facility] today. Patient & daughter, Sharee Pimple, aware. Discharge packet given to RN. [facility] called for transport.   Please call report to [place], Axis Worker [phone]

## 2018-02-20 NOTE — Telephone Encounter (Signed)
Patient called back. Patient is in Susan [name] at [place] and will be there for about 4-6 weeks per patient.

## 2018-02-20 NOTE — Consult Note (Addendum)
   [facility] CM Inpatient Consult   02/20/2018  Susan [name] [birth year] [id number]    [facility] Care Management hospital liaison follow up.  Spoke with (covering) inpatient LCSW. [name] will discharge to [facility] for respite. Therefore, there are no longer any identifiable [facility] Care Management needs. Made [place] with Home Care Providers aware that patient will not be going home after all. Therefore, Home Care Providers will not follow as previously planned either.     Marthenia Rolling, MSN-Ed, RN,BSN [facility] Liaison [phone]

## 2018-02-20 NOTE — Discharge Summary (Signed)
Physician Discharge Summary  Susan WREN KKX:[id number] DOB: 1940-05-19 DOA: 02/12/2018  PCP: Lauree Chandler, NP  Admit date: 02/12/2018 Discharge date: 02/20/2018  Admitted From: Home Disposition:  Assisted Living  Recommendations for Outpatient Follow-up:  1. Follow up with PCP in 1-2 weeks 2. Please obtain BMP/CBC in one week  Discharge Condition:Improved CODE STATUS:Full Diet recommendation: Heart healthy   Brief/Interim Summary: 78 y.o.femalewith medical history significant ofprior CVA, hypertension, hyperlipidemia, paroxysmal A. fib on chronic anticoagulation with Eliquis, spinal stenosis, who presents to the hospital with chief complaint of having a fall as well as left lower extremity pain. Patient has been dealing with left lower extremity cellulitis for the past 2 months. She was hospitalized twice in December 2019, received a course of intravenous antibiotics in the hospital, she was transitioned to p.o., discharged and upon finishing up the p.o. antibiotics for cellulitis recurred. Most recent discharge was at the end of December 2019, she had antibiotics for a few days at her SNF but she has now been off her antibiotics for the past 3 to 4 weeks. For the last week, she has noticed increased redness and swelling of the left lower extremity, as well as increased pain.    Discharge Diagnoses:  Principal Problem:   Cellulitis Active Problems:   TIA (transient ischemic attack)   History of CVA (cerebrovascular accident)   HTN (hypertension)   1. Recurrent left left lower extremity cellulitis. Patient initially had been on vancomycin and ceftriaxone, completed course of keflex  2. Elevated uric acid likely gout ,uric acid is 7.2. initially started her on colchicine. Discontinued colchicine as it is most likely the cause for patient's neutropenia.  3. History of CVA with dysarthria. Patient has increased fall risk she was admitted from a assisted living  facility. PT/ OT was consulted and they both recommend skilled nursing facility. Social worker consult has been put in for evaluation for skilled nursing placement still waiting for social worker consult.[facility] Speech therapy evaluated and recommended skilled facility and speech therapy. Plan for facility placement 2/14 per SW  4. Hypertension -BP stable at this time -Currently on coreg, lasix, losartan  5. Acute on chronic kidney failure stage III. Her baseline is between 1.2 and 1.4. Renal function improved with hydration  6. History of chronic systolic dysfunction heart failure current EF of 55 to 60% she has had EF as low as 35% with dilated cardiomyopathy patient is well compensated at this time.   7. 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 Conversing appropriately  8. Paroxysmal atrial fibrillation we will continue Eliquis - rate controlled  9. severe osteoarthritis. Patient is being considered for left knee replacement. But due to her cellulitis it skin be recommended to hold it until she is completely healed -Stable at present  10. Neutropenia. Likely in the setting of use of colchicine as well as active infection. Colchicine had been discontinued Stable at present   Discharge Instructions  Discharge Instructions    Diet - low sodium heart healthy   Complete by:  As directed    Discharge instructions   Complete by:  As directed    It is important that you read the given instructions as well as go over your medication list with RN to help you understand your care after this hospitalization.  Discharge Instructions: Please follow-up with PCP in 1-2 weeks  Please request your primary care physician to go over all Hospital Tests and Procedure/Radiological results at the  follow up. Please get all Hospital records sent to your PCP by signing hospital release before you go home.   Do not take more than  prescribed Pain, Sleep and Anxiety Medications. You were cared for by a hospitalist during your hospital stay. If you have any questions about your discharge medications or the care you received while you were in the hospital after you are discharged, you can call the unit @UNIT @ you were admitted to and ask to speak with the hospitalist on call if the hospitalist that took care of you is not available.  Once you are discharged, your primary care physician will handle any further medical issues. Please note that NO REFILLS for any discharge medications will be authorized once you are discharged, as it is imperative that you return to your primary care physician (or establish a relationship with a primary care physician if you do not have one) for your aftercare needs so that they can reassess your need for medications and monitor your lab values. You Must read complete instructions/literature along with all the possible adverse reactions/side effects for all the Medicines you take and that have been prescribed to you. Take any new Medicines after you have completely understood and accept all the possible adverse reactions/side effects. Wear Seat belts while driving. If you have smoked or chewed Tobacco in the last 2 yrs please stop smoking and/or stop any Recreational drug use.  If you drink alcohol, please moderate the use and do not drive, operating heavy machinery, perform activities at heights, swimming or participation in water activities or provide baby sitting services under influence.   Increase activity slowly   Complete by:  As directed      Allergies as of 02/20/2018      Reactions   Ace Inhibitors Cough   Lipitor [atorvastatin] Swelling   Simvastatin Other (See Comments)   Myalgias    Latex Itching      Medication List    TAKE these medications   acetaminophen 500 MG tablet Commonly known as:  TYLENOL Take 1,000 mg by mouth daily.   apixaban 5 MG Tabs tablet Commonly known as:   ELIQUIS Take 1 tablet (5 mg total) by mouth every 12 (twelve) hours.   ASPERCREME LIDOCAINE 4 % Ptch Generic drug:  Lidocaine Apply 1 patch topically daily.   buPROPion 200 MG 12 hr tablet Commonly known as:  WELLBUTRIN SR Take 200 mg by mouth daily.   Calcium Carbonate-Vitamin D 600-400 MG-UNIT tablet Take 1 tablet by mouth 2 (two) times daily. What changed:  when to take this   carvedilol 12.5 MG tablet Commonly known as:  COREG Take 1 tablet (12.5 mg total) by mouth 2 (two) times daily with a meal.   cholecalciferol 25 MCG (1000 UT) tablet Commonly known as:  VITAMIN D Take 1,000 Units by mouth daily.   cyclobenzaprine 5 MG tablet Commonly known as:  FLEXERIL Take 5 mg by mouth 3 (three) times daily.   diclofenac sodium 1 % Gel Commonly known as:  VOLTAREN Apply 2 g topically 4 (four) times daily.   docusate sodium 100 MG capsule Commonly known as:  COLACE Take 100 mg by mouth 2 (two) times daily.   donepezil 10 MG tablet Commonly known as:  ARICEPT Take 1 tablet (10 mg total) by mouth at bedtime. For memory loss   furosemide 20 MG tablet Commonly known as:  LASIX Take 20 mg by mouth daily.   gabapentin 300 MG capsule Commonly known as:  NEURONTIN Take 600 mg by mouth 2 (two) times daily.   hydrALAZINE 25 MG tablet Commonly known as:  APRESOLINE Take 1 tablet (25 mg total) by mouth 3 (three) times daily.   IFEREX 150 150 MG capsule Generic drug:  iron polysaccharides Take 150 mg by mouth daily.   losartan 50 MG tablet Commonly known as:  COZAAR Take 1 tablet (50 mg total) by mouth daily.   Menthol (Topical Analgesic) 4 % Gel Commonly known as:  BIOFREEZE Apply 3 oz topically 3 (three) times daily. For knee pain   multivitamin with minerals Tabs tablet Take 1 tablet by mouth daily.   pantoprazole 40 MG tablet Commonly known as:  PROTONIX Take 40 mg by mouth daily.   pravastatin 40 MG tablet Commonly known as:  PRAVACHOL Take 40 mg by mouth  daily.   saccharomyces boulardii 250 MG capsule Commonly known as:  FLORASTOR Take 1 capsule (250 mg total) by mouth 2 (two) times daily.   sertraline 25 MG tablet Commonly known as:  ZOLOFT Take 0.5 tablets (12.5 mg total) by mouth 2 (two) times daily.   vitamin B-12 1000 MCG tablet Commonly known as:  CYANOCOBALAMIN Take 2,000 mcg by mouth daily.     ASK your doctor about these medications   cephALEXin 500 MG capsule Commonly known as:  KEFLEX Take 1 capsule (500 mg total) by mouth 3 (three) times daily for 2 days. Ask about: Should I take this medication?            Durable Medical Equipment  (From admission, onward)         Start     Ordered   02/18/18 1427  For home use only DME lightweight manual wheelchair with seat cushion  Once    Comments:  Patient suffers from Cellulitis, TIA, CVA, HTN, which impairs their ability to perform daily activities like walking, basic care in the home.  A walker will not resolve  issue with performing activities of daily living. A wheelchair will allow patient to safely perform daily activities. Patient is not able to propel themselves in the home using a standard weight wheelchair due to weakness, decline in health. Patient can self propel in the lightweight wheelchair.  Accessories: elevating leg rests (ELRs), wheel locks, extensions and anti-tippers.   02/18/18 1429         Follow-up Information    Lauree Chandler, NP. Schedule an appointment as soon as possible for a visit in 1 week(s).   Specialty:  Geriatric Medicine Contact information: [address]. [place] [address] [phone]          Allergies  Allergen Reactions  . Ace Inhibitors Cough  . Lipitor [Atorvastatin] Swelling  . Simvastatin Other (See Comments)    Myalgias     . Latex Itching    Procedures/Studies: Dg Lumbar Spine 2-3 Views  Result Date: 02/12/2018 CLINICAL DATA:  Pain following fall EXAM: LUMBAR SPINE - 2-3 VIEW COMPARISON:   Lumbar MRI Jun 07, 2014 FINDINGS: Frontal, lateral, and spot lumbosacral lateral images were obtained. There are 5 non-rib-bearing lumbar type vertebral bodies. There is no acute fracture. There is 5 mm of anterolisthesis of L4 on L5, stable. There is minimal anterolisthesis of L3 on L4, stable. No new spondylolisthesis. There is marked disc space narrowing at L5-S1. There is moderate disc space narrowing at L3-4 and L4-5 with vacuum phenomenon at these levels. No erosive changes. There is facet osteoarthritic change at L3-4, L4-5, and L5-S1 bilaterally. There  is aortic atherosclerosis. There are foci of mesh in the lower abdomen/upper pelvic wall regions. IMPRESSION: Osteoarthritic change at L3-4, L4-5, and L5-S1, most severe at L5-S1. spondylolisthesis at L4-5 and to a lesser extent at L3-4 appears stable. No fracture evident. There is aortic atherosclerosis. Electronically Signed   By: Lowella Grip III M.D.   On: 02/12/2018 13:31   Ct Head Wo Contrast  Result Date: 02/12/2018 CLINICAL DATA:  Head trauma, anticoagulation, fall from wheelchair EXAM: CT HEAD WITHOUT CONTRAST; CT CERVICAL SPINE WITHOUT CONTRAST TECHNIQUE: Contiguous axial images were obtained from the base of the skull through the vertex without intravenous contrast. COMPARISON:  12/21/2017 FINDINGS: CT HEAD FINDINGS Brain: No evidence of acute infarction, hemorrhage, hydrocephalus, extra-axial collection or mass lesion/mass effect. Periventricular white matter hypodensity. Vascular: No hyperdense vessel or unexpected calcification. Skull: Normal. Negative for fracture or focal lesion. Sinuses/Orbits: No acute finding. Other: None. CT CERVICAL SPINE FINDINGS Alignment: Degenerative straightening of the normal cervical lordosis. Skull base and vertebrae: No acute fracture. No primary bone lesion or focal pathologic process. There is a right hemi laminectomy of C1 versus congenital ossification variant. Soft tissues and spinal canal: No  prevertebral fluid or swelling. No visible canal hematoma. Disc levels: Severe multilevel disc degenerative disease of the cervical spine. Upper chest: Negative. Other: None. IMPRESSION: 1. No acute intracranial pathology. Small-vessel white matter disease. 2. No fracture or static subluxation of the cervical spine. Severe multilevel disc degenerative disease. Electronically Signed   By: Eddie Candle M.D.   On: 02/12/2018 13:18   Ct Cervical Spine Wo Contrast  Result Date: 02/12/2018 CLINICAL DATA:  Head trauma, anticoagulation, fall from wheelchair EXAM: CT HEAD WITHOUT CONTRAST; CT CERVICAL SPINE WITHOUT CONTRAST TECHNIQUE: Contiguous axial images were obtained from the base of the skull through the vertex without intravenous contrast. COMPARISON:  12/21/2017 FINDINGS: CT HEAD FINDINGS Brain: No evidence of acute infarction, hemorrhage, hydrocephalus, extra-axial collection or mass lesion/mass effect. Periventricular white matter hypodensity. Vascular: No hyperdense vessel or unexpected calcification. Skull: Normal. Negative for fracture or focal lesion. Sinuses/Orbits: No acute finding. Other: None. CT CERVICAL SPINE FINDINGS Alignment: Degenerative straightening of the normal cervical lordosis. Skull base and vertebrae: No acute fracture. No primary bone lesion or focal pathologic process. There is a right hemi laminectomy of C1 versus congenital ossification variant. Soft tissues and spinal canal: No prevertebral fluid or swelling. No visible canal hematoma. Disc levels: Severe multilevel disc degenerative disease of the cervical spine. Upper chest: Negative. Other: None. IMPRESSION: 1. No acute intracranial pathology. Small-vessel white matter disease. 2. No fracture or static subluxation of the cervical spine. Severe multilevel disc degenerative disease. Electronically Signed   By: Eddie Candle M.D.   On: 02/12/2018 13:18   Mr Brain Wo Contrast  Result Date: 02/12/2018 CLINICAL DATA:  78 y/o F;  unwitnessed fall. TIA, initial exam. 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 EXAM: MRI HEAD WITHOUT CONTRAST MRA HEAD WITHOUT CONTRAST TECHNIQUE: Multiplanar, multiecho pulse sequences of the brain and surrounding structures were obtained without intravenous contrast. Angiographic images of the head were obtained using MRA technique without contrast. COMPARISON:  02/12/2018 CT head.  01/25/2015 MRI and MRA head. FINDINGS: MRI HEAD FINDINGS Brain: No acute infarction, hemorrhage, hydrocephalus, extra-axial collection or mass lesion. Small chronic lacunar infarcts are present within the left thalamus and posterior limb of internal capsule. Punctate subcortical and early confluent periventricular nonspecific T2 FLAIR hyperintensities are compatible with mild chronic microvascular ischemic changes for age. Advanced volume loss of the  brain with prominent asymmetric volume loss in the bilateral parietal lobes and anterior hippocampi. Vascular: Normal flow voids. Skull and upper cervical spine: Normal marrow signal. Sinuses/Orbits: Mild diffuse paranasal sinus mucosal thickening. No abnormal signal of the mastoid air cells. Bilateral intra-ocular lens replacement. Other: None. MRA HEAD FINDINGS Internal carotid arteries: Patent. Small left ICA due to variant ACA anatomy. Anterior cerebral arteries: Patent. Large left A1, large anterior communicating artery, hypoplastic left A1, normal variant. Middle cerebral arteries: Patent.  Mild distal left P1 stenosis. Anterior communicating artery: Patent. Posterior communicating arteries: Patent right. No left identified, likely hypoplastic or absent. Posterior cerebral arteries:  Patent.  Mild right P2 stenosis. Basilar artery:  Patent. Vertebral arteries:  Patent. Previously described segments of high-grade stenosis are not visible on the current study, likely due to artifact on the prior MRA of the head. No evidence of high-grade stenosis, large  vessel occlusion, or aneurysm. IMPRESSION: 1. No acute intracranial abnormality. 2. Mild for age chronic microvascular ischemic changes of the brain. Advanced volume loss of the brain with asymmetric parietal and anterior hippocampal volume loss consistent with history of Alzheimer's disease. 3. Patent anterior and posterior intracranial circulation. No large vessel occlusion, aneurysm, or significant stenosis is identified. Electronically Signed   By: Kristine Garbe M.D.   On: 02/12/2018 18:30   Mr Jodene [name] Head Wo Contrast  Result Date: 02/12/2018 CLINICAL DATA:  78 y/o F; unwitnessed fall. TIA, initial exam. 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 EXAM: MRI HEAD WITHOUT CONTRAST MRA HEAD WITHOUT CONTRAST TECHNIQUE: Multiplanar, multiecho pulse sequences of the brain and surrounding structures were obtained without intravenous contrast. Angiographic images of the head were obtained using MRA technique without contrast. COMPARISON:  02/12/2018 CT head.  01/25/2015 MRI and MRA head. FINDINGS: MRI HEAD FINDINGS Brain: No acute infarction, hemorrhage, hydrocephalus, extra-axial collection or mass lesion. Small chronic lacunar infarcts are present within the left thalamus and posterior limb of internal capsule. Punctate subcortical and early confluent periventricular nonspecific T2 FLAIR hyperintensities are compatible with mild chronic microvascular ischemic changes for age. Advanced volume loss of the brain with prominent asymmetric volume loss in the bilateral parietal lobes and anterior hippocampi. Vascular: Normal flow voids. Skull and upper cervical spine: Normal marrow signal. Sinuses/Orbits: Mild diffuse paranasal sinus mucosal thickening. No abnormal signal of the mastoid air cells. Bilateral intra-ocular lens replacement. Other: None. MRA HEAD FINDINGS Internal carotid arteries: Patent. Small left ICA due to variant ACA anatomy. Anterior cerebral arteries: Patent. Large  left A1, large anterior communicating artery, hypoplastic left A1, normal variant. Middle cerebral arteries: Patent.  Mild distal left P1 stenosis. Anterior communicating artery: Patent. Posterior communicating arteries: Patent right. No left identified, likely hypoplastic or absent. Posterior cerebral arteries:  Patent.  Mild right P2 stenosis. Basilar artery:  Patent. Vertebral arteries:  Patent. Previously described segments of high-grade stenosis are not visible on the current study, likely due to artifact on the prior MRA of the head. No evidence of high-grade stenosis, large vessel occlusion, or aneurysm. IMPRESSION: 1. No acute intracranial abnormality. 2. Mild for age chronic microvascular ischemic changes of the brain. Advanced volume loss of the brain with asymmetric parietal and anterior hippocampal volume loss consistent with history of Alzheimer's disease. 3. Patent anterior and posterior intracranial circulation. No large vessel occlusion, aneurysm, or significant stenosis is identified. Electronically Signed   By: Kristine Garbe M.D.   On: 02/12/2018 18:30   Vas [place] Burnard Bunting With/wo Tbi  Result Date: 02/13/2018 LOWER EXTREMITY DOPPLER STUDY  Indications: Cellulitis.  Performing Technologist: June Leap Rdms, Rvt  Examination Guidelines: A complete evaluation includes at minimum, Doppler waveform signals and systolic blood pressure reading at the level of bilateral brachial, anterior tibial, and posterior tibial arteries, when vessel segments are accessible. Bilateral testing is considered an integral part of a complete examination. Photoelectric Plethysmograph (PPG) waveforms and toe systolic pressure readings are included as required and additional duplex testing as needed. Limited examinations for reoccurring indications may be performed as noted.  ABI Findings: +--------+------------------+-----+----------+--------+ Right   Rt Pressure (mmHg)IndexWaveform  Comment   +--------+------------------+-----+----------+--------+ [id number]                    triphasic          +--------+------------------+-----+----------+--------+ ATA     120               0.77 monophasic         +--------+------------------+-----+----------+--------+ PTA     93                0.60 monophasic         +--------+------------------+-----+----------+--------+ +--------+------------------+-----+----------+-------+ Left    Lt Pressure (mmHg)IndexWaveform  Comment +--------+------------------+-----+----------+-------+ [id number]                    triphasic         +--------+------------------+-----+----------+-------+ ATA     149               0.96 biphasic          +--------+------------------+-----+----------+-------+ PTA     158               1.02 monophasic        +--------+------------------+-----+----------+-------+ Left ABI is borderline mild disease.  Summary: Right: Resting right ankle-brachial index indicates moderate right lower extremity arterial disease. Left: Resting left ankle-brachial index is within normal range.  *See table(s) above for measurements and observations.  Electronically signed by Monica Martinez MD on 02/13/2018 at 3:41:53 PM.   Final    Vas [place] Lower Extremity Venous (dvt)  Result Date: 02/13/2018  Lower Venous Study Indications: Edema/ cellulitis. Other Indications: Multiple cases of cellulitis in past few months. Comparison Study: 12/21/17- negative. Performing Technologist: June Leap RDMS, RVT  Examination Guidelines: A complete evaluation includes B-mode imaging, spectral Doppler, color Doppler, and power Doppler as needed of all accessible portions of each vessel. Bilateral testing is considered an integral part of a complete examination. Limited examinations for reoccurring indications may be performed as noted.  Left Venous Findings: +---------+---------------+---------+-----------+----------+-------------------+           CompressibilityPhasicitySpontaneityPropertiesSummary             +---------+---------------+---------+-----------+----------+-------------------+ CFV      Full           Yes      Yes                                      +---------+---------------+---------+-----------+----------+-------------------+ SFJ      Full                                                             +---------+---------------+---------+-----------+----------+-------------------+ FV Prox  Full                                                             +---------+---------------+---------+-----------+----------+-------------------+  FV Mid   Full                                                             +---------+---------------+---------+-----------+----------+-------------------+ FV DistalFull                                                             +---------+---------------+---------+-----------+----------+-------------------+ PFV      Full                                                             +---------+---------------+---------+-----------+----------+-------------------+ POP      Full           Yes      Yes                                      +---------+---------------+---------+-----------+----------+-------------------+ PTV      Full                                                             +---------+---------------+---------+-----------+----------+-------------------+ PERO     Full                                         not well visualized +---------+---------------+---------+-----------+----------+-------------------+    Summary: Left: There is no evidence of deep vein thrombosis in the lower extremity. No cystic structure found in the popliteal fossa.  *See table(s) above for measurements and observations. Electronically signed by Monica Martinez MD on 02/13/2018 at 3:44:12 PM.    Final      Subjective: Eager to be discharged  Discharge  Exam: Vitals:   02/20/18 0522 02/20/18 1041  BP: 133/70 (!) 150/81  Pulse: 64 67  Resp: 20   Temp: 98.3 F (36.8 C)   SpO2: 95% 100%   Vitals:   02/19/18 1505 02/19/18 2120 02/20/18 0522 02/20/18 1041  BP: (!) 150/68 (!) 162/69 133/70 (!) 150/81  Pulse: 63 63 64 67  Resp: 18 18 20    Temp: 99 F (37.2 C) 97.6 F (36.4 C) 98.3 F (36.8 C)   TempSrc: Oral Oral Oral   SpO2: 99% 100% 95% 100%  Weight:      Height:        General: Pt is alert, awake, not in acute distress Cardiovascular: RRR, S1/S2 +, no rubs, no gallops Respiratory: CTA bilaterally, no wheezing, no rhonchi Abdominal: Soft, NT, ND, bowel sounds + Extremities: no edema, no cyanosis   The results of significant diagnostics from this hospitalization (including imaging, microbiology, ancillary and laboratory) are listed  below for reference.     Microbiology: Recent Results (from the past 240 hour(s))  MRSA PCR Screening     Status: None   Collection Time: 02/13/18  8:48 AM  Result Value Ref Range Status   MRSA by PCR NEGATIVE NEGATIVE Final    Comment:        The GeneXpert MRSA Assay (FDA approved for NASAL specimens only), is one component of a comprehensive MRSA colonization surveillance program. It is not intended to diagnose MRSA infection nor to guide or monitor treatment for MRSA infections. Performed at [facility], [place] [address] [place], [place] [address]      Labs: BNP (last 3 results) No results for input(s): BNP in the last 8760 hours. Basic Metabolic Panel: Recent Labs  Lab 02/14/18 0242 02/15/18 1911  NA 140 139  K 3.7 3.7  CL 106 105  CO2 26 29  GLUCOSE 124* 111*  BUN 19 22  CREATININE 1.48* 1.19*  CALCIUM 9.2 9.3   Liver Function Tests: Recent Labs  Lab 02/14/18 0242  AST 23  ALT 14  ALKPHOS 58  BILITOT 0.4  PROT 6.0*  ALBUMIN 3.0*   No results for input(s): LIPASE, AMYLASE in the last 168 hours. No results for input(s): AMMONIA in the last  168 hours. CBC: Recent Labs  Lab 02/14/18 0242 02/16/18 0510 02/17/18 0758  WBC 3.2* 2.8* 2.9*  NEUTROABS  --  1.4* 1.2*  HGB 9.1* 9.2* 9.9*  HCT 30.0* 30.5* 32.5*  MCV 96.8 95.9 94.5  PLT 249 243 273   Cardiac Enzymes: No results for input(s): CKTOTAL, CKMB, CKMBINDEX, TROPONINI in the last 168 hours. BNP: Invalid input(s): POCBNP CBG: No results for input(s): GLUCAP in the last 168 hours. D-Dimer No results for input(s): DDIMER in the last 72 hours. Hgb A1c No results for input(s): HGBA1C in the last 72 hours. Lipid Profile No results for input(s): CHOL, HDL, LDLCALC, TRIG, CHOLHDL, LDLDIRECT in the last 72 hours. Thyroid function studies No results for input(s): TSH, T4TOTAL, T3FREE, THYROIDAB in the last 72 hours.  Invalid input(s): FREET3 Anemia work up No results for input(s): VITAMINB12, FOLATE, FERRITIN, TIBC, IRON, RETICCTPCT in the last 72 hours. Urinalysis    Component Value Date/Time   COLORURINE YELLOW 02/12/2018 1628   APPEARANCEUR CLEAR 02/12/2018 1628   LABSPEC 1.009 02/12/2018 1628   PHURINE 8.0 02/12/2018 1628   GLUCOSEU NEGATIVE 02/12/2018 1628   HGBUR NEGATIVE 02/12/2018 1628   BILIRUBINUR NEGATIVE 02/12/2018 1628   KETONESUR NEGATIVE 02/12/2018 1628   PROTEINUR NEGATIVE 02/12/2018 1628   UROBILINOGEN 0.2 03/05/2013 1259   NITRITE NEGATIVE 02/12/2018 1628   LEUKOCYTESUR NEGATIVE 02/12/2018 1628   Sepsis Labs Invalid input(s): PROCALCITONIN,  WBC,  LACTICIDVEN Microbiology Recent Results (from the past 240 hour(s))  MRSA PCR Screening     Status: None   Collection Time: 02/13/18  8:48 AM  Result Value Ref Range Status   MRSA by PCR NEGATIVE NEGATIVE Final    Comment:        The GeneXpert MRSA Assay (FDA approved for NASAL specimens only), is one component of a comprehensive MRSA colonization surveillance program. It is not intended to diagnose MRSA infection nor to guide or monitor treatment for MRSA infections. Performed at [facility], [place] [address] [place], [place] [address]    Time spent: 30 min  SIGNED:   Marylu Lund, MD  [facility] 02/20/2018, 12:40 PM  If 7PM-7AM, please contact night-coverage

## 2018-03-02 ENCOUNTER — Encounter: Payer: Self-pay | Admitting: Nurse Practitioner

## 2018-03-02 ENCOUNTER — Ambulatory Visit (INDEPENDENT_AMBULATORY_CARE_PROVIDER_SITE_OTHER): Payer: PPO | Admitting: Nurse Practitioner

## 2018-03-02 VITALS — BP 136/84 | HR 61 | Temp 98.2°F | Resp 10 | Ht 62.0 in | Wt 179.0 lb

## 2018-03-02 DIAGNOSIS — N183 Chronic kidney disease, stage 3 unspecified: Secondary | ICD-10-CM

## 2018-03-02 DIAGNOSIS — I110 Hypertensive heart disease with heart failure: Secondary | ICD-10-CM

## 2018-03-02 DIAGNOSIS — G8929 Other chronic pain: Secondary | ICD-10-CM | POA: Diagnosis not present

## 2018-03-02 DIAGNOSIS — I4819 Other persistent atrial fibrillation: Secondary | ICD-10-CM

## 2018-03-02 DIAGNOSIS — E785 Hyperlipidemia, unspecified: Secondary | ICD-10-CM

## 2018-03-02 DIAGNOSIS — L03119 Cellulitis of unspecified part of limb: Secondary | ICD-10-CM | POA: Diagnosis not present

## 2018-03-02 DIAGNOSIS — R2681 Unsteadiness on feet: Secondary | ICD-10-CM

## 2018-03-02 DIAGNOSIS — M25561 Pain in right knee: Secondary | ICD-10-CM | POA: Diagnosis not present

## 2018-03-02 DIAGNOSIS — F0150 Vascular dementia without behavioral disturbance: Secondary | ICD-10-CM | POA: Diagnosis not present

## 2018-03-02 MED ORDER — DOXYCYCLINE HYCLATE 100 MG PO TABS: 100.0000 mg | ORAL_TABLET | Freq: Two times a day (BID) | ORAL | 0 refills | Status: DC

## 2018-03-02 NOTE — Patient Instructions (Signed)
To start doxycycline 100 mg by mouth twice daily for 10 days Take probiotic twice daily for 2 weeks.   Follow up in office in 1 week for recheck.

## 2018-03-02 NOTE — Progress Notes (Signed)
Careteam: Patient Care Team: Lauree Chandler, NP as PCP - General (Geriatric Medicine) Sueanne Margarita, MD as PCP - Cardiology (Cardiology)  Advanced Directive information Does Patient Have a Medical Advance Directive?: Yes, Type of Advance Directive: [place];Living will, Does patient want to make changes to medical advance directive?: No - Patient declined  Allergies  Allergen Reactions  . Ace Inhibitors Cough  . Lipitor [Atorvastatin] Swelling  . Simvastatin Other (See Comments)    Myalgias     . Latex Itching    Chief Complaint  Patient presents with  . [facility] admission follow-up 02/12/2018-02/20/2018 for fall and cellulitis. Patient also to follow-up from [facility]   . Fall Risk    Moderate fall risk, please further access   . Leg Swelling    Left leg swelling   . Tremors    Patient c/o hand tremors   . Cough    Patient c/o dry x 1.5 month(s)  . Back Pain    Shooting back pain, ongoing. Pain increases with movement      HPI: Patient is a 78 y.o. female seen in the office today for hospital follow up.  Pt with hx of significant ofprior CVA, hypertension, hyperlipidemia, paroxysmal A. fib on chronic anticoagulation with Eliquis, spinal stenosis, who presents to the hospital with chief complaint of having a fall as well as left lower extremity pain. Patient has been dealing with left lower extremity cellulitis for the past 2 months. She was hospitalized twice in December 2019, received a course of intravenous antibiotics in the hospital, she was transitioned to p.o., discharged and upon finishing up the p.o. antibiotics for cellulitis recurred. Most recent discharge was at the end of December 2019, she had antibiotics for a few days at her SNF but she has now been off her antibiotics for the past 3 to 4 weeks. For the last week, she has noticed increased redness and swelling of the left lower extremity, as well as increased  pain. She has completed vancomycin, ceftriaxone, and keflex during hospitalization. Currently leg is starting to swell again. Right leg is significantly more swollen the left. Leg is painful to touch. Daughter and granddaughter both report leg is darker (when she was dc from hospital both legs looked the same in regards to color)  No fevers or chills. Reports increase in chills and fatigue but this has been ongoing since prior to hospitalization. In the last few days she has had increase in confusion.   Last week had diarrhea but there was a GI bug at her facility. Currently with regular BM  Pt with a hx of CVA with severe OA of knees. PT/ OT was consulted and they both recommend skilled nursing facility. She was discharged to [facility].   Shoes are uncomfortable. Would like recommendations on better foot wear because they are uncomfortable.   Hypertension- stable on coreg, lasix, and losartan.   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   A fib- rate controlled, continues on eliquis.    Review of Systems:  Review of Systems  Constitutional: Negative for chills, fever and weight loss.  HENT: Positive for congestion. Negative for sinus pain, sore throat and tinnitus.   Respiratory: Positive for cough (for about 4 month, reports due to post  nasal drip). Negative for sputum production and shortness of breath.   Cardiovascular: Positive for leg swelling (pain and darker color to left.). Negative for chest pain and palpitations.  Gastrointestinal: Negative for abdominal pain, constipation, diarrhea and heartburn.  Genitourinary: Negative for dysuria, frequency and urgency.       Pt with urinary incontinence   Musculoskeletal: Positive for joint pain. Negative for back pain, falls and myalgias.  Skin:  Negative.   Neurological: Negative for dizziness, tingling and headaches.  Psychiatric/Behavioral: Positive for memory loss.    Past Medical History:  Diagnosis Date  . Anxiety   . Arthritis    "legs, back" (07/29/2012)  . Cellulitis and abscess of leg 12/29/2017  . Chronic diastolic (congestive) heart failure ([place])   . Chronic lower back pain   . Complication of anesthesia    "I have apnea" (07/29/2012)  . 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 Family history of anesthesia complication    "some wake up during OR; some are hard to wake up; some both" (07/29/2012)  . GERD (gastroesophageal reflux disease)   . 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 ([place])   . History of CVA (cerebrovascular accident) 02/01/2015  . History of stomach ulcers 1980's  . Hyperlipidemia   . Hyperparathyroidism, primary ([place]) 04/22/2012  . Hypertensive heart disease   . Incontinent of urine    wears pads  . Major depressive disorder, recurrent episode, severe ([place]) 11/21/2015  . OSA on CPAP   . Osteoarthritis of right knee   . Pedal edema   . Persistent atrial fibrillation    CHADS2VASC score is 7 - on chronic anticoagulation with Apixaban  . Spinal stenosis   . Umbilical hernia    "unrepaired" (07/29/2012)  . Varicose veins    "BLE" (07/29/2012)  . Vascular dementia [facility])    Past Surgical History:  Procedure Laterality Date  . CATARACT EXTRACTION    . CHOLECYSTECTOMY  ~ 2010  . COLONOSCOPY    . IUD REMOVAL  1980's  . PARATHYROIDECTOMY N/A 06/16/2012   Procedure: NECK EXPLORATION AND LEFT SUPERIOR PARATHYROIDECTOMY;  Surgeon: Earnstine Regal, MD;  Location: [facility];  Service: General;  Laterality: N/A;  . 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 TOTAL KNEE ARTHROPLASTY Right 07/29/2012   Procedure: TOTAL KNEE ARTHROPLASTY;  Surgeon: Ninetta Lights, MD;  Location: [place];  Service: Orthopedics;  Laterality: Right;   Social History:   reports that she quit smoking  about 40 years ago. Her smoking use included cigarettes. She has a 30.00 pack-year smoking history. She has never used smokeless tobacco. She reports that she does not drink alcohol or use drugs.  Family History  Problem Relation Age of Onset  . Hypertension Mother        deceased  . Stroke Mother   . Breast cancer Mother   . Lung cancer Father        deceased  . Diabetes Daughter   . Hypertension Daughter     Medications: Patient's Medications  New Prescriptions   No medications on file  Previous Medications   ACETAMINOPHEN (TYLENOL) 500 MG TABLET    Take 1,000 mg by mouth as needed.    APIXABAN (ELIQUIS) 5 MG TABS TABLET    Take 1 tablet (5 mg total) by mouth every 12 (twelve) hours.   BUPROPION (WELLBUTRIN SR) 200 MG 12 HR TABLET    Take 200 mg by mouth daily.    CALCIUM CARBONATE-VITAMIN D 600-400  MG-UNIT TABLET    Take 1 tablet by mouth 2 (two) times daily.   CARVEDILOL (COREG) 12.5 MG TABLET    Take 1 tablet (12.5 mg total) by mouth 2 (two) times daily with a meal.   CHOLECALCIFEROL (VITAMIN D) 1000 UNITS TABLET    Take 1,000 Units by mouth daily.    DICLOFENAC SODIUM (VOLTAREN) 1 % GEL    Apply 2 g topically 4 (four) times daily.   DOCUSATE SODIUM (COLACE) 100 MG CAPSULE    Take 100 mg by mouth 2 (two) times daily.    DONEPEZIL (ARICEPT) 10 MG TABLET    Take 1 tablet (10 mg total) by mouth at bedtime. For memory loss   FUROSEMIDE (LASIX) 20 MG TABLET    Take 20 mg by mouth daily.   GABAPENTIN (NEURONTIN) 300 MG CAPSULE    Take 600 mg by mouth 2 (two) times daily.    HYDRALAZINE (APRESOLINE) 25 MG TABLET    Take 1 tablet (25 mg total) by mouth 3 (three) times daily.   IRON POLYSACCHARIDES (IFEREX 150) 150 MG CAPSULE    Take 150 mg by mouth daily.   LIDOCAINE (ASPERCREME LIDOCAINE) 4 % PTCH    Apply 1 patch topically daily.   LOSARTAN (COZAAR) 50 MG TABLET    Take 1 tablet (50 mg total) by mouth daily.   MENTHOL, TOPICAL ANALGESIC, (BIOFREEZE) 4 % GEL    Apply 3 oz topically 3  (three) times daily. For knee pain   MULTIPLE VITAMIN (MULTIVITAMIN WITH MINERALS) TABS    Take 1 tablet by mouth daily.   PANTOPRAZOLE (PROTONIX) 40 MG TABLET    Take 40 mg by mouth daily.   PRAVASTATIN (PRAVACHOL) 40 MG TABLET    Take 40 mg by mouth daily.   SACCHAROMYCES BOULARDII (FLORASTOR) 250 MG CAPSULE    Take 1 capsule (250 mg total) by mouth 2 (two) times daily.   SERTRALINE (ZOLOFT) 25 MG TABLET    Take 0.5 tablets (12.5 mg total) by mouth 2 (two) times daily.   VITAMIN B-12 (CYANOCOBALAMIN) 1000 MCG TABLET    Take 2,000 mcg by mouth daily.  Modified Medications   No medications on file  Discontinued Medications   CYCLOBENZAPRINE (FLEXERIL) 5 MG TABLET    Take 5 mg by mouth 3 (three) times daily.      Physical Exam:  Vitals:   03/02/18 1232  BP: 136/84  Pulse: 61  Resp: 10  Temp: 98.2 F (36.8 C)  TempSrc: Oral  SpO2: 97%  Weight: 179 lb (81.2 kg)  Height: 5\' 2"  (1.575 m)   Body mass index is 32.74 kg/m.  Physical Exam Constitutional:      General: She is not in acute distress.    Appearance: She is obese.  HENT:     Head: Normocephalic.     Nose: Nose normal. No congestion or rhinorrhea.     Mouth/Throat:     Mouth: Mucous membranes are moist.     Pharynx: Oropharynx is clear. No oropharyngeal exudate or posterior oropharyngeal erythema.  Eyes:     General: No scleral icterus.       Right eye: No discharge.        Left eye: No discharge.     Conjunctiva/sclera: Conjunctivae normal.     Pupils: Pupils are equal, round, and reactive to light.  Neck:     Musculoskeletal: Normal range of motion. No muscular tenderness.  Cardiovascular:     Rate and Rhythm: Normal rate. Rhythm irregular.  Pulses: Normal pulses.     Heart sounds: Murmur present. No friction rub. No gallop.   Pulmonary:     Effort: Pulmonary effort is normal. No respiratory distress.     Breath sounds: Normal breath sounds. No wheezing, rhonchi or rales.  Chest:     Chest wall: No  tenderness.  Abdominal:     General: Bowel sounds are normal. There is no distension.     Palpations: Abdomen is soft. There is no mass.     Tenderness: There is no abdominal tenderness. There is no right CVA tenderness, left CVA tenderness, guarding or rebound.  Musculoskeletal:        General: Tenderness present.     Right lower leg: No edema.     Left lower leg: She exhibits swelling. She exhibits no tenderness, no deformity and no laceration. Edema present.     Comments: Unsteady gait.left leg non-pitting edema 2+  Lymphadenopathy:     Cervical: No cervical adenopathy.  Skin:    General: Skin is warm and dry.     Coloration: Skin is not pale.     Findings: Erythema (slight erythema noted to left lower leg) present. No rash.  Neurological:     Mental Status: She is alert and oriented to person, place, and time.     Cranial Nerves: No cranial nerve deficit.     Sensory: No sensory deficit.     Motor: No weakness.     Coordination: Coordination normal.     Gait: Gait abnormal.  Psychiatric:        Mood and Affect: Mood normal.        Speech: Speech normal.        Behavior: Behavior normal.        Thought Content: Thought content normal.        Cognition and Memory: She exhibits impaired recent memory.        Judgment: Judgment normal.     Labs reviewed: Basic Metabolic Panel: Recent Labs    09/05/17 0803  02/06/18 1429 02/12/18 1246 02/14/18 0242 02/15/18 1911  NA 142   < > 141 139 140 139  K 4.2   < > 4.2 3.8 3.7 3.7  CL 109   < > 106 104 106 105  CO2 26   < > 27 27 26 29   GLUCOSE 87   < > 123 99 124* 111*  BUN 24   < > 21 21 19 22   CREATININE 1.46*   < > 1.43* 1.38* 1.48* 1.19*  CALCIUM 10.3   < > 10.7* 10.0 9.2 9.3  TSH 1.95  --  1.61  --   --   --    < > = values in this interval not displayed.   Liver Function Tests: Recent Labs    12/21/17 1527 12/29/17 1025 02/06/18 1429 02/14/18 0242  AST 48* 38 27 23  ALT 29 33 15 14  ALKPHOS 50 76  --  58    BILITOT 0.6 1.2 0.4 0.4  PROT 6.9 6.0* 6.8 6.0*  ALBUMIN 3.6 2.2*  --  3.0*   No results for input(s): LIPASE, AMYLASE in the last 8760 hours. No results for input(s): AMMONIA in the last 8760 hours. CBC: Recent Labs    02/12/18 1246 02/14/18 0242 02/16/18 0510 02/17/18 0758  WBC 4.1 3.2* 2.8* 2.9*  NEUTROABS 2.6  --  1.4* 1.2*  HGB 10.1* 9.1* 9.2* 9.9*  HCT 32.9* 30.0* 30.5* 32.5*  MCV 96.5 96.8 95.9  94.5  PLT 245 249 243 273   Lipid Panel: Recent Labs    09/05/17 0803 02/13/18 0423  CHOL 199 179  HDL 65 63  LDLCALC 117* 103*  TRIG 73 65  CHOLHDL 3.1 2.8   TSH: Recent Labs    09/05/17 0803 02/06/18 1429  TSH 1.95 1.61   A1C: Lab Results  Component Value Date   HGBA1C 5.9 (H) 02/13/2018     Assessment/Plan 1. Recurrent cellulitis of lower extremity -family and pt reports left leg had improved on discharge from hospital now with increase swelling, tenderness and warmth.  - BASIC METABOLIC PANEL WITH GFR - Ambulatory referral to Infectious Disease  - CBC with Differential/Platelet - doxycycline (VIBRA-TABS) 100 MG tablet; Take 1 tablet (100 mg total) by mouth 2 (two) times daily.  Dispense: 20 tablet; Refill: 0  2. Unsteady gait -pt with hx of CVA, debility and OA of knees. She is currently getting home health PT at [facility]   3. Hypertensive heart disease with congestive heart failure, unspecified heart failure type (HCC) Stable, continues on hydralazine, losartan, coreg and lasix   4. Persistent atrial fibrillation Rate controlled, continues on coreg and eliquis  5. Chronic pain of right knee Ongoing, she has been awaiting a total knee replacement but due to recurrent cellulitis this has been on hold. Continues with PT  6. Vascular dementia without behavioral disturbance (HCC) Ongoing, worsening confusion noted. Continues on aricept. 10 mg daily. Currently staying at [facility] with PT  7. CKD (chronic kidney disease) stage 3, GFR 30-59 ml/min  (HCC) Encourage proper hydration and to avoid NSAIDS (Aleve, Advil, Motrin, Ibuprofen) -follow up BMP  8. Hyperlipidemia, unspecified hyperlipidemia type -she has been on pravastatin long term without reaction despite allergy to Lipitor and simvastatin she has been able to tolerate pravastatin  Next appt: 1 week -03/09/2018 Carlos American. [place], [facility] [phone]

## 2018-03-03 LAB — CBC WITH DIFFERENTIAL/PLATELET
ABSOLUTE MONOCYTES: 438 {cells}/uL (ref 200–950)
Basophils Absolute: 39 cells/uL (ref 0–200)
Basophils Relative: 1.1 %
Eosinophils Absolute: 130 cells/uL (ref 15–500)
Eosinophils Relative: 3.7 %
HEMATOCRIT: 29.6 % — AB (ref 35.0–45.0)
Hemoglobin: 9.9 g/dL — ABNORMAL LOW (ref 11.7–15.5)
Lymphs Abs: 956 cells/uL (ref 850–3900)
MCH: 29.6 pg (ref 27.0–33.0)
MCHC: 33.4 g/dL (ref 32.0–36.0)
MCV: 88.4 fL (ref 80.0–100.0)
MPV: 10.2 fL (ref 7.5–12.5)
Monocytes Relative: 12.5 %
Neutro Abs: 1939 cells/uL (ref 1500–7800)
Neutrophils Relative %: 55.4 %
Platelets: 319 10*3/uL (ref 140–400)
RBC: 3.35 10*6/uL — AB (ref 3.80–5.10)
RDW: 14.2 % (ref 11.0–15.0)
Total Lymphocyte: 27.3 %
WBC: 3.5 10*3/uL — AB (ref 3.8–10.8)

## 2018-03-03 LAB — BASIC METABOLIC PANEL WITH GFR
BUN/Creatinine Ratio: 19 (calc) (ref 6–22)
BUN: 25 mg/dL (ref 7–25)
CO2: 27 mmol/L (ref 20–32)
Calcium: 10.2 mg/dL (ref 8.6–10.4)
Chloride: 109 mmol/L (ref 98–110)
Creat: 1.3 mg/dL — ABNORMAL HIGH (ref 0.60–0.88)
GFR, Est African American: 45 mL/min/{1.73_m2} — ABNORMAL LOW (ref >=60)
GFR, Est Non African American: 39 mL/min/{1.73_m2} — ABNORMAL LOW (ref >=60)
Glucose, Bld: 79 mg/dL (ref 65–139)
Potassium: 4.3 mmol/L (ref 3.5–5.3)
Sodium: 143 mmol/L (ref 135–146)

## 2018-03-09 ENCOUNTER — Ambulatory Visit (INDEPENDENT_AMBULATORY_CARE_PROVIDER_SITE_OTHER): Payer: PPO | Admitting: Nurse Practitioner

## 2018-03-09 ENCOUNTER — Encounter: Payer: Self-pay | Admitting: Nurse Practitioner

## 2018-03-09 ENCOUNTER — Telehealth: Payer: Self-pay

## 2018-03-09 VITALS — BP 148/96 | HR 65 | Temp 97.6°F | Ht 62.0 in | Wt 189.0 lb

## 2018-03-09 DIAGNOSIS — I4819 Other persistent atrial fibrillation: Secondary | ICD-10-CM

## 2018-03-09 DIAGNOSIS — L03119 Cellulitis of unspecified part of limb: Secondary | ICD-10-CM | POA: Diagnosis not present

## 2018-03-09 DIAGNOSIS — N183 Chronic kidney disease, stage 3 unspecified: Secondary | ICD-10-CM

## 2018-03-09 DIAGNOSIS — R2681 Unsteadiness on feet: Secondary | ICD-10-CM | POA: Diagnosis not present

## 2018-03-09 DIAGNOSIS — F0150 Vascular dementia without behavioral disturbance: Secondary | ICD-10-CM | POA: Diagnosis not present

## 2018-03-09 DIAGNOSIS — Z7901 Long term (current) use of anticoagulants: Secondary | ICD-10-CM | POA: Diagnosis not present

## 2018-03-09 DIAGNOSIS — R195 Other fecal abnormalities: Secondary | ICD-10-CM

## 2018-03-09 DIAGNOSIS — I110 Hypertensive heart disease with heart failure: Secondary | ICD-10-CM | POA: Diagnosis not present

## 2018-03-09 DIAGNOSIS — D631 Anemia in chronic kidney disease: Secondary | ICD-10-CM | POA: Diagnosis not present

## 2018-03-09 DIAGNOSIS — L03116 Cellulitis of left lower limb: Secondary | ICD-10-CM | POA: Diagnosis not present

## 2018-03-09 DIAGNOSIS — I130 Hypertensive heart and chronic kidney disease with heart failure and stage 1 through stage 4 chronic kidney disease, or unspecified chronic kidney disease: Secondary | ICD-10-CM | POA: Diagnosis not present

## 2018-03-09 DIAGNOSIS — I69318 Other symptoms and signs involving cognitive functions following cerebral infarction: Secondary | ICD-10-CM | POA: Diagnosis not present

## 2018-03-09 DIAGNOSIS — I5032 Chronic diastolic (congestive) heart failure: Secondary | ICD-10-CM | POA: Diagnosis not present

## 2018-03-09 DIAGNOSIS — F332 Major depressive disorder, recurrent severe without psychotic features: Secondary | ICD-10-CM | POA: Diagnosis not present

## 2018-03-09 DIAGNOSIS — I69351 Hemiplegia and hemiparesis following cerebral infarction affecting right dominant side: Secondary | ICD-10-CM | POA: Diagnosis not present

## 2018-03-09 DIAGNOSIS — Z9181 History of falling: Secondary | ICD-10-CM | POA: Diagnosis not present

## 2018-03-09 DIAGNOSIS — I69322 Dysarthria following cerebral infarction: Secondary | ICD-10-CM | POA: Diagnosis not present

## 2018-03-09 DIAGNOSIS — G4733 Obstructive sleep apnea (adult) (pediatric): Secondary | ICD-10-CM | POA: Diagnosis not present

## 2018-03-09 DIAGNOSIS — M170 Bilateral primary osteoarthritis of knee: Secondary | ICD-10-CM | POA: Diagnosis not present

## 2018-03-09 NOTE — Progress Notes (Signed)
Careteam: Patient Care Team: Lauree Chandler, NP as PCP - General (Geriatric Medicine) Sueanne Margarita, MD as PCP - Cardiology (Cardiology)  Advanced Directive information    Allergies  Allergen Reactions  . Ace Inhibitors Cough  . Lipitor [Atorvastatin] Swelling  . Simvastatin Other (See Comments)    Myalgias     . Latex Itching    Chief Complaint  Patient presents with  . Follow-up    1 week follow-up on cellulitis      HPI: Patient is a 78 y.o. female seen in the office today to follow up cellulitis. Pt has been hospitalized 3 times due to cellulitis first time on 12/22/2017. She was most recently discharged on 02/20/2018 and at that time reported the cellulitis in her leg had resolved and there was no edema, pain, redness to left lower leg. Once she was discharged reports swelling, pain, color change gradually reoccurred. She was seen in office last week and started on doxycycline twice daily for 10 days with ID consult.  Pt has been on doxycyline for 1 week BID, left leg has not shown any improvement. Daughter feels like it is worse. Painful when touched due to being tight and swollen. Left leg darker than right as well.  Continues to use compression hose during the day- not wearing today Overall feels well. No increased fatigue or malaise. No increase in confusion.  Pt states she has a loose stool about every other day since on doxycyline. Currently on probiotic. She also is taking stool softener.  Of note pt with paroxysmal a fib and on Eliquis. During last hospitalization in feb 2020 had lower extremity US: There is no evidence of deep vein thrombosis in the lower extremity. No cystic structure found in the popliteal fossa. They compared the December 2019 [place] which was also negative for DVT.  She continues on lasix 20 mg daily  Review of Systems:  Review of Systems  Constitutional: Negative for chills, fever and weight loss.  HENT: Positive for congestion. Negative  for sinus pain, sore throat and tinnitus.   Respiratory: Positive for cough (for about 4 month, reports due to post nasal drip). Negative for sputum production and shortness of breath.   Cardiovascular: Positive for leg swelling (pain and darker color to left.). Negative for chest pain and palpitations.  Gastrointestinal: Negative for abdominal pain, constipation, diarrhea and heartburn.  Genitourinary: Negative for dysuria, frequency and urgency.       Pt with urinary incontinence   Musculoskeletal: Positive for joint pain (knee). Negative for back pain, falls and myalgias.  Skin: Negative.   Neurological: Negative for dizziness, tingling and headaches.  Psychiatric/Behavioral: Positive for memory loss.    Past Medical History:  Diagnosis Date  . Anxiety   . Arthritis    "legs, back" (07/29/2012)  . Cellulitis and abscess of leg 12/29/2017  . Chronic diastolic (congestive) heart failure ([place])   . Chronic lower back pain   . Complication of anesthesia    "I have apnea" (07/29/2012)  . 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 Family history of anesthesia complication    "some wake up during OR; some are hard to wake up; some both" (07/29/2012)  . GERD (gastroesophageal reflux disease)   . 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 ([place])   . History of CVA (cerebrovascular accident) 02/01/2015  . History of stomach ulcers 1980's  . Hyperlipidemia   . Hyperparathyroidism, primary ([place]) 04/22/2012  . Hypertensive heart  disease   . Incontinent of urine    wears pads  . Major depressive disorder, recurrent episode, severe ([place]) 11/21/2015  . OSA on CPAP   . Osteoarthritis of right knee   . Pedal edema   . Persistent atrial fibrillation    CHADS2VASC score is 7 - on chronic anticoagulation with Apixaban  . Spinal stenosis   . Umbilical hernia    "unrepaired" (07/29/2012)  . Varicose veins    "BLE" (07/29/2012)  . Vascular dementia [facility])    Past Surgical  History:  Procedure Laterality Date  . CATARACT EXTRACTION    . CHOLECYSTECTOMY  ~ 2010  . COLONOSCOPY    . IUD REMOVAL  1980's  . PARATHYROIDECTOMY N/A 06/16/2012   Procedure: NECK EXPLORATION AND LEFT SUPERIOR PARATHYROIDECTOMY;  Surgeon: Earnstine Regal, MD;  Location: [facility];  Service: General;  Laterality: N/A;  . 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 TOTAL KNEE ARTHROPLASTY Right 07/29/2012   Procedure: TOTAL KNEE ARTHROPLASTY;  Surgeon: Ninetta Lights, MD;  Location: [facility];  Service: Orthopedics;  Laterality: Right;   Social History:   reports that she quit smoking about 40 years ago. Her smoking use included cigarettes. She has a 30.00 pack-year smoking history. She has never used smokeless tobacco. She reports that she does not drink alcohol or use drugs.  Family History  Problem Relation Age of Onset  . Hypertension Mother        deceased  . Stroke Mother   . Breast cancer Mother   . Lung cancer Father        deceased  . Diabetes Daughter   . Hypertension Daughter     Medications: Patient's Medications  New Prescriptions   No medications on file  Previous Medications   ACETAMINOPHEN (TYLENOL) 500 MG TABLET    Take 1,000 mg by mouth as needed.    APIXABAN (ELIQUIS) 5 MG TABS TABLET    Take 1 tablet (5 mg total) by mouth every 12 (twelve) hours.   BUPROPION (WELLBUTRIN SR) 200 MG 12 HR TABLET    Take 200 mg by mouth daily.    CALCIUM CARBONATE-VITAMIN D 600-400 MG-UNIT TABLET    Take 1 tablet by mouth 2 (two) times daily.   CARVEDILOL (COREG) 12.5 MG TABLET    Take 1 tablet (12.5 mg total) by mouth 2 (two) times daily with a meal.   CHOLECALCIFEROL (VITAMIN D) 1000 UNITS TABLET    Take 1,000 Units by mouth daily.    DICLOFENAC SODIUM (VOLTAREN) 1 % GEL    Apply 2 g topically 4 (four) times daily.   DOCUSATE SODIUM (COLACE) 100 MG CAPSULE    Take 100 mg by mouth 2 (two) times daily.    DONEPEZIL (ARICEPT) 10 MG TABLET    Take 1 tablet (10 mg total) by mouth at  bedtime. For memory loss   DOXYCYCLINE (VIBRA-TABS) 100 MG TABLET    Take 1 tablet (100 mg total) by mouth 2 (two) times daily.   FUROSEMIDE (LASIX) 20 MG TABLET    Take 20 mg by mouth daily.   GABAPENTIN (NEURONTIN) 300 MG CAPSULE    Take 600 mg by mouth 2 (two) times daily.    HYDRALAZINE (APRESOLINE) 25 MG TABLET    Take 1 tablet (25 mg total) by mouth 3 (three) times daily.   IRON POLYSACCHARIDES (IFEREX 150) 150 MG CAPSULE    Take 150 mg by mouth daily.   LIDOCAINE (ASPERCREME LIDOCAINE) 4 % PTCH  Apply 1 patch topically daily.   LOSARTAN (COZAAR) 50 MG TABLET    Take 1 tablet (50 mg total) by mouth daily.   MENTHOL, TOPICAL ANALGESIC, (BIOFREEZE) 4 % GEL    Apply 3 oz topically 3 (three) times daily. For knee pain   MULTIPLE VITAMIN (MULTIVITAMIN WITH MINERALS) TABS    Take 1 tablet by mouth daily.   PANTOPRAZOLE (PROTONIX) 40 MG TABLET    Take 40 mg by mouth daily.   PRAVASTATIN (PRAVACHOL) 40 MG TABLET    Take 40 mg by mouth daily.   SACCHAROMYCES BOULARDII (FLORASTOR) 250 MG CAPSULE    Take 1 capsule (250 mg total) by mouth 2 (two) times daily.   SERTRALINE (ZOLOFT) 25 MG TABLET    Take 0.5 tablets (12.5 mg total) by mouth 2 (two) times daily.   VITAMIN B-12 (CYANOCOBALAMIN) 1000 MCG TABLET    Take 2,000 mcg by mouth daily.  Modified Medications   No medications on file  Discontinued Medications   No medications on file     Physical Exam:  Vitals:   03/09/18 0843  BP: (!) 148/96  Pulse: 65  Temp: 97.6 F (36.4 C)  TempSrc: Oral  SpO2: 97%  Weight: 189 lb (85.7 kg)  Height: 5\' 2"  (1.575 m)   Body mass index is 34.57 kg/m.  Physical Exam Constitutional:      General: She is not in acute distress.    Appearance: She is obese.  HENT:     Head: Normocephalic.     Nose: Nose normal. No congestion or rhinorrhea.     Mouth/Throat:     Mouth: Mucous membranes are moist.     Pharynx: Oropharynx is clear. No oropharyngeal exudate or posterior oropharyngeal erythema.    Eyes:     General: No scleral icterus.       Right eye: No discharge.        Left eye: No discharge.     Conjunctiva/sclera: Conjunctivae normal.     Pupils: Pupils are equal, round, and reactive to light.  Neck:     Musculoskeletal: Normal range of motion. No muscular tenderness.  Cardiovascular:     Rate and Rhythm: Normal rate. Rhythm irregular.     Pulses: Normal pulses.     Heart sounds: Murmur present. No friction rub. No gallop.   Pulmonary:     Effort: Pulmonary effort is normal. No respiratory distress.     Breath sounds: Normal breath sounds. No wheezing, rhonchi or rales.  Chest:     Chest wall: No tenderness.  Abdominal:     General: Bowel sounds are normal.     Palpations: Abdomen is soft.  Musculoskeletal:        General: Tenderness present.     Right lower leg: No edema.     Left lower leg: She exhibits swelling. She exhibits no tenderness, no deformity and no laceration. Edema present.     Comments: Unsteady gait.left leg non-pitting edema 2+  Lymphadenopathy:     Cervical: No cervical adenopathy.  Skin:    General: Skin is warm and dry.     Coloration: Skin is not pale.     Findings: Erythema (slight erythema noted to left lower leg) present. No rash.  Neurological:     Mental Status: She is alert and oriented to person, place, and time.     Cranial Nerves: No cranial nerve deficit.     Sensory: No sensory deficit.     Motor: No weakness.  Coordination: Coordination normal.     Gait: Gait abnormal.  Psychiatric:        Mood and Affect: Mood normal.        Speech: Speech normal.        Behavior: Behavior normal.        Thought Content: Thought content normal.        Cognition and Memory: She exhibits impaired recent memory.        Judgment: Judgment normal.     Labs reviewed: Basic Metabolic Panel: Recent Labs    09/05/17 0803  02/06/18 1429  02/14/18 0242 02/15/18 1911 03/02/18 1353  NA 142   < > 141   < > 140 139 143  K 4.2   < > 4.2    < > 3.7 3.7 4.3  CL 109   < > 106   < > 106 105 109  CO2 26   < > 27   < > 26 29 27   GLUCOSE 87   < > 123   < > 124* 111* 79  BUN 24   < > 21   < > 19 22 25   CREATININE 1.46*   < > 1.43*   < > 1.48* 1.19* 1.30*  CALCIUM 10.3   < > 10.7*   < > 9.2 9.3 10.2  TSH 1.95  --  1.61  --   --   --   --    < > = values in this interval not displayed.   Liver Function Tests: Recent Labs    12/21/17 1527 12/29/17 1025 02/06/18 1429 02/14/18 0242  AST 48* 38 27 23  ALT 29 33 15 14  ALKPHOS 50 76  --  58  BILITOT 0.6 1.2 0.4 0.4  PROT 6.9 6.0* 6.8 6.0*  ALBUMIN 3.6 2.2*  --  3.0*   No results for input(s): LIPASE, AMYLASE in the last 8760 hours. No results for input(s): AMMONIA in the last 8760 hours. CBC: Recent Labs    02/16/18 0510 02/17/18 0758 03/02/18 1353  WBC 2.8* 2.9* 3.5*  NEUTROABS 1.4* 1.2* 1,939  HGB 9.2* 9.9* 9.9*  HCT 30.5* 32.5* 29.6*  MCV 95.9 94.5 88.4  PLT 243 273 319   Lipid Panel: Recent Labs    09/05/17 0803 02/13/18 0423  CHOL 199 179  HDL 65 63  LDLCALC 117* 103*  TRIG 73 65  CHOLHDL 3.1 2.8   TSH: Recent Labs    09/05/17 0803 02/06/18 1429  TSH 1.95 1.61   A1C: Lab Results  Component Value Date   HGBA1C 5.9 (H) 02/13/2018     Assessment/Plan 1. Recurrent cellulitis of lower extremity -no improvement with doxycycline at this time.  -ID referral pending, number given to daughter to make appt.  - CBC with Differential/Platelets - BASIC METABOLIC PANEL WITH GFR  2. Persistent atrial fibrillation -rate controlled, continues on eliquis for anticoagulation.   3. CKD (chronic kidney disease) stage 3, GFR 30-59 ml/min ([facility]) -encouraged proper hydration, avoiding NSAIDS  4. Unsteady gait Continues with PT  5. Hypertensive heart disease with congestive heart failure, unspecified heart failure type ([place]) Slightly elevated today, continue current regimen with coreg, lasix, hydralazine and cozaar.    6. Loose stools -not frequent,  instructed to stop stool softener at this time. Continue probiotics  Next appt: 2 months, sooner if needed, to make appt with ID Savonna Birchmeier K. B[place], [facility] [phone]

## 2018-03-09 NOTE — Telephone Encounter (Signed)
Elmyra Ricks with [facility] called to inform Janett Billow that as they were adding doxycycline (prescibed last week) to medication list in their system an interaction populated for calcium carbonate and Iferex while taking with doxycycline.  Per verbal from [place] patient already on medication x 1 week and it is ok for her to complete course.    Elmyra Ricks verbalized understanding

## 2018-03-10 LAB — CBC WITH DIFFERENTIAL/PLATELET
Absolute Monocytes: 463 cells/uL (ref 200–950)
Basophils Absolute: 52 cells/uL (ref 0–200)
Basophils Relative: 1.4 %
EOS ABS: 122 {cells}/uL (ref 15–500)
Eosinophils Relative: 3.3 %
HCT: 30.3 % — ABNORMAL LOW (ref 35.0–45.0)
Hemoglobin: 10.1 g/dL — ABNORMAL LOW (ref 11.7–15.5)
Lymphs Abs: 862 cells/uL (ref 850–3900)
MCH: 29.4 pg (ref 27.0–33.0)
MCHC: 33.3 g/dL (ref 32.0–36.0)
MCV: 88.3 fL (ref 80.0–100.0)
MONOS PCT: 12.5 %
MPV: 10.2 fL (ref 7.5–12.5)
Neutro Abs: 2202 cells/uL (ref 1500–7800)
Neutrophils Relative %: 59.5 %
PLATELETS: 273 10*3/uL (ref 140–400)
RBC: 3.43 10*6/uL — ABNORMAL LOW (ref 3.80–5.10)
RDW: 14.4 % (ref 11.0–15.0)
Total Lymphocyte: 23.3 %
WBC: 3.7 10*3/uL — ABNORMAL LOW (ref 3.8–10.8)

## 2018-03-10 LAB — BASIC METABOLIC PANEL WITH GFR
BUN/Creatinine Ratio: 17 (calc) (ref 6–22)
BUN: 24 mg/dL (ref 7–25)
CO2: 27 mmol/L (ref 20–32)
Calcium: 10.6 mg/dL — ABNORMAL HIGH (ref 8.6–10.4)
Chloride: 106 mmol/L (ref 98–110)
Creat: 1.41 mg/dL — ABNORMAL HIGH (ref 0.60–0.88)
GFR, Est African American: 41 mL/min/{1.73_m2} — ABNORMAL LOW (ref >=60)
GFR, Est Non African American: 35 mL/min/{1.73_m2} — ABNORMAL LOW (ref >=60)
Glucose, Bld: 89 mg/dL (ref 65–99)
Potassium: 3.9 mmol/L (ref 3.5–5.3)
Sodium: 142 mmol/L (ref 135–146)

## 2018-03-11 ENCOUNTER — Other Ambulatory Visit: Payer: PPO

## 2018-03-11 ENCOUNTER — Telehealth: Payer: Self-pay | Admitting: *Deleted

## 2018-03-11 NOTE — Telephone Encounter (Signed)
Delcie Roch with [facility] called and stated that patient was seen on 03/09/2018 and Jessica sent an order to HOLD patient's Colace but didn't state when to restart. Delcie Roch wants to know how long should they HOLD the Colace. Please Advise.

## 2018-03-11 NOTE — Telephone Encounter (Signed)
Hold while she is having loose stool

## 2018-03-11 NOTE — Telephone Encounter (Signed)
Called [facility] to speak with Delcie Roch but unable to locate. Got the fax number to fax order Fax: [phone] Message faxed.

## 2018-03-12 ENCOUNTER — Encounter: Payer: Self-pay | Admitting: Infectious Disease

## 2018-03-12 ENCOUNTER — Ambulatory Visit (INDEPENDENT_AMBULATORY_CARE_PROVIDER_SITE_OTHER): Payer: PPO | Admitting: Infectious Disease

## 2018-03-12 VITALS — BP 179/93 | HR 61 | Temp 97.5°F

## 2018-03-12 DIAGNOSIS — L0390 Cellulitis, unspecified: Secondary | ICD-10-CM

## 2018-03-12 DIAGNOSIS — N183 Chronic kidney disease, stage 3 unspecified: Secondary | ICD-10-CM

## 2018-03-12 DIAGNOSIS — M25473 Effusion, unspecified ankle: Secondary | ICD-10-CM

## 2018-03-12 DIAGNOSIS — M00072 Staphylococcal arthritis, left ankle and foot: Secondary | ICD-10-CM

## 2018-03-12 DIAGNOSIS — L03116 Cellulitis of left lower limb: Secondary | ICD-10-CM | POA: Diagnosis not present

## 2018-03-12 DIAGNOSIS — F0150 Vascular dementia without behavioral disturbance: Secondary | ICD-10-CM | POA: Diagnosis not present

## 2018-03-12 DIAGNOSIS — Z8673 Personal history of transient ischemic attack (TIA), and cerebral infarction without residual deficits: Secondary | ICD-10-CM

## 2018-03-12 DIAGNOSIS — F0390 Unspecified dementia without behavioral disturbance: Secondary | ICD-10-CM | POA: Insufficient documentation

## 2018-03-12 HISTORY — DX: Unspecified dementia, unspecified severity, without behavioral disturbance, psychotic disturbance, mood disturbance, and anxiety: F03.90

## 2018-03-12 MED ORDER — CEPHALEXIN 500 MG PO CAPS: 500.0000 mg | ORAL_CAPSULE | Freq: Four times a day (QID) | ORAL | 11 refills | Status: DC

## 2018-03-12 NOTE — Progress Notes (Signed)
Reason for infectious disease consult: Recurrent lower extremity cellulitis  Requesting physician: Sherrie Mustache, NP   Subjective:    Patient ID: Susan [name], female    DOB: 1940-06-19, 78 y.o.   MRN: [id number]  HPI  This is an 78 year old African-American lady with multiple medical problems including dementia, CVA with dysarthria and left hemiparesis chronic heart failure and atrial fibrillation who apparently developed erythema and swelling in her left ankle where she thought she had suffered a "spider bite in December.  This progressed because her daughter claims that mom did not tell anyone about it.  Early December she was admitted to the hospital after having been found down at home.  In the ER she was febrile confused, febrile with tachypnea and evidence of left-sided cellulitis.  Blood cultures were taken and she was started initially on vancomycin.  She improved on vancomycin therapy later was discharged on oral Augmentin.  While on Augmentin she was then was then readmitted to the hospital now with purulent drainage from a full-thickness wound wound fevers up to 101.7.  She was again given vancomycin Zosyn with improvement in her erythema and her clinical status.  She is ultimately changed over to ciprofloxacin and doxycycline.  Then readmitted in early February again with evidence of cellulitis and confusion.  She was given intravenous antibiotics in the form of a myosin and ceftriaxone then followed by oral Keflex.  She is but discharged to skilled nursing facility but then again was having erythema of her leg.  She was seen by her primary care physician who initiated doxycycline but is found no improvement in her erythema.    Her leg was imaged with plain films and CT of the tib-fib which was without any evidence of osteomyelitis.  Today on exam her left leg is markedly more swollen and edematous than the right 1.  She has venous stasis changes and exquisite tenderness palpation of  the ankle and of the calf.  She did have DVT ruled out when she was in the hospital.  She denies any history of surgery in the left leg or inguinal area she denies trauma to this area she did have cellulitis many years ago in this leg.  Past Medical History:  Diagnosis Date  . Anxiety   . Arthritis    "legs, back" (07/29/2012)  . Cellulitis and abscess of leg 12/29/2017  . Chronic diastolic (congestive) heart failure ([place])   . Chronic lower back pain   . Complication of anesthesia    "I have apnea" (07/29/2012)  . 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 Family history of anesthesia complication    "some wake up during OR; some are hard to wake up; some both" (07/29/2012)  . GERD (gastroesophageal reflux disease)   . 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 ([place])   . History of CVA (cerebrovascular accident) 02/01/2015  . History of stomach ulcers 1980's  . Hyperlipidemia   . Hyperparathyroidism, primary ([place]) 04/22/2012  . Hypertensive heart disease   . Incontinent of urine    wears pads  . Major depressive disorder, recurrent episode, severe ([place]) 11/21/2015  . OSA on CPAP   . Osteoarthritis of right knee   . Pedal edema   . Persistent atrial fibrillation    CHADS2VASC score is 7 - on chronic anticoagulation with Apixaban  . Spinal stenosis   . Umbilical hernia    "unrepaired" (07/29/2012)  . Varicose veins    "BLE" (07/29/2012)  .  Vascular dementia [facility]     Past Surgical History:  Procedure Laterality Date  . CATARACT EXTRACTION    . CHOLECYSTECTOMY  ~ 2010  . COLONOSCOPY    . IUD REMOVAL  1980's  . PARATHYROIDECTOMY N/A 06/16/2012   Procedure: NECK EXPLORATION AND LEFT SUPERIOR PARATHYROIDECTOMY;  Surgeon: Earnstine Regal, MD;  Location: [facility];  Service: General;  Laterality: N/A;  . 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 TOTAL KNEE ARTHROPLASTY Right 07/29/2012   Procedure: TOTAL KNEE ARTHROPLASTY;  Surgeon: Ninetta Lights, MD;   Location: [facility];  Service: Orthopedics;  Laterality: Right;    Family History  Problem Relation Age of Onset  . Hypertension Mother        deceased  . Stroke Mother   . Breast cancer Mother   . Lung cancer Father        deceased  . Diabetes Daughter   . Hypertension Daughter       Social History   Socioeconomic History  . Marital status: Widowed    Spouse name: Not on file  . Number of children: Not on file  . Years of education: Not on file  . Highest education level: Not on file  Occupational History  . Occupation: retired    Fish farm manager: RETIRED  Social Needs  . Financial resource strain: Not hard at all  . Food insecurity:    Worry: Never true    Inability: Never true  . Transportation needs:    Medical: No    Non-medical: No  Tobacco Use  . Smoking status: Former Smoker    Packs/day: 1.00    Years: 30.00    Pack years: 30.00    Types: Cigarettes    Last attempt to quit: 01/07/1978    Years since quitting: 40.2  . Smokeless tobacco: Never Used  Substance and Sexual Activity  . Alcohol use: No  . Drug use: No  . Sexual activity: Not Currently    Comment: intercourse age 15,less than 5 secxual partners,  Lifestyle  . Physical activity:    Days per week: 3 days    Minutes per session: 30 min  . Stress: Rather much  Relationships  . Social connections:    Talks on phone: More than three times a week    Gets together: More than three times a week    Attends religious service: More than 4 times per year    Active member of club or organization: No    Attends meetings of clubs or organizations: Never    Relationship status: Widowed  Other Topics Concern  . Not on file  Social History Narrative   Lives at home alone   Right-handed   Caffeine: 1 cup of coffee per day         Diet:      Caffeine:      Married, if yes what year:       Do you live in a house, apartment, assisted living, condo, trailer, ect:      Pets:      Current/Past profession:       Exercise:         Living Will: Yes   DNR: No   POA/HPOA: Yes      Functional Status:   Do you have difficulty bathing or dressing yourself? Yes   Do you have difficulty preparing food or eating? No   Do you have difficulty managing your medications? Yes   Do you have difficulty managing your  finances? Yes   Do you have difficulty affording your medications? Yes    Allergies  Allergen Reactions  . Ace Inhibitors Cough  . Lipitor [Atorvastatin] Swelling  . Simvastatin Other (See Comments)    Myalgias     . Latex Itching     Current Outpatient Medications:  .  Calcium Carbonate-Vitamin D 600-400 MG-UNIT tablet, Take 1 tablet by mouth 2 (two) times daily., Disp: 30 tablet, Rfl: 2 .  carvedilol (COREG) 12.5 MG tablet, Take 1 tablet (12.5 mg total) by mouth 2 (two) times daily with a meal., Disp: 60 tablet, Rfl: 0 .  diclofenac sodium (VOLTAREN) 1 % GEL, Apply 2 g topically 4 (four) times daily., Disp: 1 Tube, Rfl: 5 .  docusate sodium (COLACE) 100 MG capsule, Take 100 mg by mouth 2 (two) times daily. , Disp: , Rfl:  .  donepezil (ARICEPT) 10 MG tablet, Take 1 tablet (10 mg total) by mouth at bedtime. For memory loss, Disp: 90 tablet, Rfl: 3 .  doxycycline (VIBRA-TABS) 100 MG tablet, Take 1 tablet (100 mg total) by mouth 2 (two) times daily., Disp: 20 tablet, Rfl: 0 .  furosemide (LASIX) 20 MG tablet, Take 20 mg by mouth daily., Disp: , Rfl:  .  gabapentin (NEURONTIN) 300 MG capsule, Take 600 mg by mouth 2 (two) times daily. , Disp: , Rfl:  .  hydrALAZINE (APRESOLINE) 25 MG tablet, Take 1 tablet (25 mg total) by mouth 3 (three) times daily., Disp: 90 tablet, Rfl: 0 .  iron polysaccharides (IFEREX 150) 150 MG capsule, Take 150 mg by mouth daily., Disp: , Rfl:  .  Lidocaine (ASPERCREME LIDOCAINE) 4 % PTCH, Apply 1 patch topically daily., Disp: , Rfl:  .  losartan (COZAAR) 50 MG tablet, Take 1 tablet (50 mg total) by mouth daily., Disp: 10 tablet, Rfl: 0 .  Menthol, Topical  Analgesic, (BIOFREEZE) 4 % GEL, Apply 3 oz topically 3 (three) times daily. For knee pain, Disp: 1 Tube, Rfl: 3 .  Multiple Vitamin (MULTIVITAMIN WITH MINERALS) TABS, Take 1 tablet by mouth daily., Disp: , Rfl:  .  pantoprazole (PROTONIX) 40 MG tablet, Take 40 mg by mouth daily., Disp: , Rfl:  .  acetaminophen (TYLENOL) 500 MG tablet, Take 1,000 mg by mouth as needed. , Disp: , Rfl:  .  apixaban (ELIQUIS) 5 MG TABS tablet, Take 1 tablet (5 mg total) by mouth every 12 (twelve) hours., Disp: 60 tablet, Rfl: 4 .  buPROPion (WELLBUTRIN SR) 200 MG 12 hr tablet, Take 200 mg by mouth daily. , Disp: , Rfl:  .  cephALEXin (KEFLEX) 500 MG capsule, Take 1 capsule (500 mg total) by mouth 4 (four) times daily., Disp: 120 capsule, Rfl: 11 .  cholecalciferol (VITAMIN D) 1000 units tablet, Take 1,000 Units by mouth daily. , Disp: , Rfl:  .  pravastatin (PRAVACHOL) 40 MG tablet, Take 40 mg by mouth daily., Disp: , Rfl:  .  saccharomyces boulardii (FLORASTOR) 250 MG capsule, Take 1 capsule (250 mg total) by mouth 2 (two) times daily., Disp: , Rfl:  .  sertraline (ZOLOFT) 25 MG tablet, Take 0.5 tablets (12.5 mg total) by mouth 2 (two) times daily., Disp: 90 tablet, Rfl: 1 .  vitamin B-12 (CYANOCOBALAMIN) 1000 MCG tablet, Take 2,000 mcg by mouth daily., Disp: , Rfl:    Review of Systems  Unable to perform ROS: Dementia       Objective:   Physical Exam Constitutional:      General: She is not in acute  distress.    Appearance: She is obese. She is not ill-appearing.  HENT:     Head: Normocephalic and atraumatic.     Nose: Nose normal.     Mouth/Throat:     Mouth: Mucous membranes are moist.  Eyes:     General: No scleral icterus.       Right eye: No discharge.        Left eye: No discharge.     Extraocular Movements: Extraocular movements intact.     Conjunctiva/sclera: Conjunctivae normal.  Neck:     Musculoskeletal: Normal range of motion.  Cardiovascular:     Rate and Rhythm: Normal rate and  regular rhythm.     Pulses: Normal pulses.  Pulmonary:     Effort: No respiratory distress.     Breath sounds: No wheezing.  Abdominal:     General: Abdomen is flat.  Neurological:     Mental Status: She is alert.  Psychiatric:        Mood and Affect: Mood normal.        Speech: Speech is delayed.        Behavior: Behavior is cooperative.        Thought Content: Thought content normal.        Cognition and Memory: Memory is impaired.   Left lower extremity March 12, 2018:               Assessment & Plan:   Recurrent cellulitis in left leg: I wonder if she could have a deeper infection for example in the ankle which was never imaged aggressively.  I am ordering MRI of the ankle which will do with gadolinium if she has kidney function that will permit this.  I would like to switch her from doxycycline to Keflex which will be better for MS staph aureus and strep, given that she did well on this while hospitalized after down titrated on vancomycin and ceftriaxone and did not improve much on doxycycline.  Being said her clinical picture and story is fairly confusing in terms of that she work did worse while on Augmentin which would be active against strep and methicillin sensitive staph but then she improved on cephalexin on a prior admission.  Perhaps the second admission in December had due to the fact that she has an area of purulence that needed to drain and was not incised and debrided.  Again the other thing could be that there is a deeper infection that is only being kept at [facility] with antibiotics and that she needs a surgical intervention.  We will check baseline labs today as mentioned get an MRI and place her on cephalexin and see her back in several weeks time.  I spent greater than 55 minutes with the patient including greater than 50% of time in face to face counsel of the patient and her daughter regarding the work-up for her recurrent cellulitis ways would try to look  for deeper infection and in coordination of  Her  care.

## 2018-03-13 LAB — BASIC METABOLIC PANEL WITH GFR
BUN/Creatinine Ratio: 18 (calc) (ref 6–22)
BUN: 26 mg/dL — ABNORMAL HIGH (ref 7–25)
CO2: 27 mmol/L (ref 20–32)
Calcium: 10.1 mg/dL (ref 8.6–10.4)
Chloride: 109 mmol/L (ref 98–110)
Creat: 1.48 mg/dL — ABNORMAL HIGH (ref 0.60–0.88)
GFR, Est African American: 38 mL/min/{1.73_m2} — ABNORMAL LOW (ref >=60)
GFR, Est Non African American: 33 mL/min/{1.73_m2} — ABNORMAL LOW (ref >=60)
Glucose, Bld: 87 mg/dL (ref 65–99)
Potassium: 4.1 mmol/L (ref 3.5–5.3)
SODIUM: 143 mmol/L (ref 135–146)

## 2018-03-13 LAB — CBC WITH DIFFERENTIAL/PLATELET
Absolute Monocytes: 454 cells/uL (ref 200–950)
Basophils Absolute: 38 cells/uL (ref 0–200)
Basophils Relative: 1.2 %
EOS PCT: 5.6 %
Eosinophils Absolute: 179 cells/uL (ref 15–500)
HCT: 29.7 % — ABNORMAL LOW (ref 35.0–45.0)
Hemoglobin: 9.7 g/dL — ABNORMAL LOW (ref 11.7–15.5)
Lymphs Abs: 832 cells/uL — ABNORMAL LOW (ref 850–3900)
MCH: 29.1 pg (ref 27.0–33.0)
MCHC: 32.7 g/dL (ref 32.0–36.0)
MCV: 89.2 fL (ref 80.0–100.0)
MONOS PCT: 14.2 %
MPV: 10.3 fL (ref 7.5–12.5)
Neutro Abs: 1696 cells/uL (ref 1500–7800)
Neutrophils Relative %: 53 %
Platelets: 253 10*3/uL (ref 140–400)
RBC: 3.33 10*6/uL — ABNORMAL LOW (ref 3.80–5.10)
RDW: 14.4 % (ref 11.0–15.0)
Total Lymphocyte: 26 %
WBC: 3.2 10*3/uL — ABNORMAL LOW (ref 3.8–10.8)

## 2018-03-13 LAB — C-REACTIVE PROTEIN: CRP: 2.3 mg/L (ref <8.0)

## 2018-03-13 LAB — SEDIMENTATION RATE: SED RATE: 29 mm/h (ref 0–30)

## 2018-03-21 ENCOUNTER — Ambulatory Visit
Admission: RE | Admit: 2018-03-21 | Discharge: 2018-03-21 | Disposition: A | Discharge status: Home / Self Care | Payer: PPO | Source: Ambulatory Visit | Attending: Infectious Disease | Admitting: Infectious Disease

## 2018-03-21 ENCOUNTER — Other Ambulatory Visit: Payer: Self-pay

## 2018-03-21 DIAGNOSIS — R2681 Unsteadiness on feet: Secondary | ICD-10-CM | POA: Diagnosis not present

## 2018-03-21 DIAGNOSIS — M00072 Staphylococcal arthritis, left ankle and foot: Secondary | ICD-10-CM

## 2018-03-21 DIAGNOSIS — M25572 Pain in left ankle and joints of left foot: Secondary | ICD-10-CM | POA: Diagnosis not present

## 2018-03-21 DIAGNOSIS — L0390 Cellulitis, unspecified: Secondary | ICD-10-CM

## 2018-03-21 DIAGNOSIS — S99912A Unspecified injury of left ankle, initial encounter: Secondary | ICD-10-CM | POA: Diagnosis not present

## 2018-03-21 DIAGNOSIS — A419 Sepsis, unspecified organism: Secondary | ICD-10-CM | POA: Diagnosis not present

## 2018-03-21 DIAGNOSIS — L03116 Cellulitis of left lower limb: Secondary | ICD-10-CM | POA: Diagnosis not present

## 2018-03-21 DIAGNOSIS — M7989 Other specified soft tissue disorders: Secondary | ICD-10-CM | POA: Diagnosis not present

## 2018-03-21 DIAGNOSIS — R2689 Other abnormalities of gait and mobility: Secondary | ICD-10-CM | POA: Diagnosis not present

## 2018-03-21 MED ORDER — GADOBENATE DIMEGLUMINE 529 MG/ML IV SOLN
10.0000 mL | Freq: Once | INTRAVENOUS | Status: AC | PRN
  Administered 2018-03-21: 10 mL via INTRAVENOUS

## 2018-03-24 ENCOUNTER — Telehealth: Payer: Self-pay | Admitting: *Deleted

## 2018-03-24 NOTE — Telephone Encounter (Signed)
The MRI does not show any evidence of deep infection of bone or joint. I would recommend continuing on oral keflex that I switched her to. I am not too enthusiastic about her coming back to the clinic soon given her age.

## 2018-03-24 NOTE — Telephone Encounter (Signed)
Patient called to get the results of her recent ankle scans. Advise will let the doctor know she called and have him review the results and someone will call her as soon as possible.

## 2018-03-27 NOTE — Telephone Encounter (Signed)
Per response from [facility] called patient daughter Sharee Pimple and advised of MRI findings and to keep on the oral Keflex until further notice. She wants to know when she should follow up here. Advise no time soon but will ask the provider and give her a call back.

## 2018-03-28 NOTE — Telephone Encounter (Signed)
I WOULD HavE her come back in about 2 months in person. We can always set up a remote evisit or talk over the phone

## 2018-03-30 ENCOUNTER — Telehealth: Payer: Self-pay

## 2018-03-30 NOTE — Telephone Encounter (Signed)
Monique with [facility] called requesting verbal order to re-cert patient for continued services. Patient is moving from assisted living back to independent living.  Per [employer] standing order, verbal order given. Message will be sent to patient's provider as a FYI.

## 2018-04-02 ENCOUNTER — Telehealth: Payer: Self-pay | Admitting: *Deleted

## 2018-04-02 NOTE — Telephone Encounter (Signed)
Daughter, Susan [name] called and stated that patient is being discharge to independent Living. Stated that they want patient recertified for [facility] so it will continue in independent living.  Telephone Note dated 03/30/18-gives Verbal orders to continue services. Also in Media dated 04/02/18 was an order signed by Janett Billow to extend [facility] with recert Dated 7/95/36 secondary to discharging from ALF back to independent living.   This message was given to the daughter. Daughter written message down.

## 2018-04-03 ENCOUNTER — Telehealth: Payer: Self-pay | Admitting: *Deleted

## 2018-04-03 NOTE — Telephone Encounter (Signed)
Monique Singletary with [facility] called and stated that she needs verbal orders to continue [place], 2x3wks, 1x2wks. Verbal orders given.

## 2018-04-07 ENCOUNTER — Telehealth: Payer: Self-pay | Admitting: *Deleted

## 2018-04-07 NOTE — Telephone Encounter (Signed)
Okay to approve this at this time, we offered a telephone visit if needed for approval

## 2018-04-07 NOTE — Telephone Encounter (Signed)
Sharee Pimple, daughter called and stated that patient was released from Assisted Living and needs a Home Health Aid. OT from [facility] came today and accessed patient and suggested and stated that they will send [place] an order.   Daughter stated Patient is at [facility] and cannot come in for an appointment due to lock down due to COVID-19. Facility is not allowing anyone to come in or leave the facility.

## 2018-04-08 ENCOUNTER — Ambulatory Visit: Payer: Self-pay | Admitting: Nurse Practitioner

## 2018-04-08 ENCOUNTER — Other Ambulatory Visit: Payer: Self-pay | Admitting: *Deleted

## 2018-04-08 DIAGNOSIS — J3089 Other allergic rhinitis: Secondary | ICD-10-CM | POA: Diagnosis not present

## 2018-04-08 DIAGNOSIS — D631 Anemia in chronic kidney disease: Secondary | ICD-10-CM

## 2018-04-08 DIAGNOSIS — M4800 Spinal stenosis, site unspecified: Secondary | ICD-10-CM | POA: Diagnosis not present

## 2018-04-08 DIAGNOSIS — M170 Bilateral primary osteoarthritis of knee: Secondary | ICD-10-CM | POA: Diagnosis not present

## 2018-04-08 DIAGNOSIS — J301 Allergic rhinitis due to pollen: Secondary | ICD-10-CM | POA: Diagnosis not present

## 2018-04-08 DIAGNOSIS — J3081 Allergic rhinitis due to animal (cat) (dog) hair and dander: Secondary | ICD-10-CM | POA: Diagnosis not present

## 2018-04-08 DIAGNOSIS — N183 Chronic kidney disease, stage 3 (moderate): Secondary | ICD-10-CM

## 2018-04-08 DIAGNOSIS — I5032 Chronic diastolic (congestive) heart failure: Secondary | ICD-10-CM | POA: Diagnosis not present

## 2018-04-08 DIAGNOSIS — I69351 Hemiplegia and hemiparesis following cerebral infarction affecting right dominant side: Secondary | ICD-10-CM | POA: Diagnosis not present

## 2018-04-08 DIAGNOSIS — I480 Paroxysmal atrial fibrillation: Secondary | ICD-10-CM | POA: Diagnosis not present

## 2018-04-08 DIAGNOSIS — I130 Hypertensive heart and chronic kidney disease with heart failure and stage 1 through stage 4 chronic kidney disease, or unspecified chronic kidney disease: Secondary | ICD-10-CM | POA: Diagnosis not present

## 2018-04-08 MED ORDER — PANTOPRAZOLE SODIUM 40 MG PO TBEC: 40.0000 mg | DELAYED_RELEASE_TABLET | Freq: Every day | ORAL | 0 refills | Status: DC

## 2018-04-08 MED ORDER — SERTRALINE HCL 25 MG PO TABS: 12.5000 mg | ORAL_TABLET | Freq: Two times a day (BID) | ORAL | 0 refills | Status: DC

## 2018-04-08 MED ORDER — LOSARTAN POTASSIUM 50 MG PO TABS: 50.0000 mg | ORAL_TABLET | Freq: Every day | ORAL | 3 refills | Status: DC

## 2018-04-08 MED ORDER — FUROSEMIDE 20 MG PO TABS: 20.0000 mg | ORAL_TABLET | Freq: Every day | ORAL | 0 refills | Status: DC

## 2018-04-08 NOTE — Telephone Encounter (Signed)
Patient daughter called for refills.  Pended refills and sent to [facility] for approval.   Losartan is in the system with directions to take for 10 days. Daughter stated that patient has been on this for awhile. Please Advise.

## 2018-04-09 DIAGNOSIS — F332 Major depressive disorder, recurrent severe without psychotic features: Secondary | ICD-10-CM | POA: Diagnosis not present

## 2018-04-09 DIAGNOSIS — Z9181 History of falling: Secondary | ICD-10-CM | POA: Diagnosis not present

## 2018-04-09 DIAGNOSIS — M4800 Spinal stenosis, site unspecified: Secondary | ICD-10-CM | POA: Diagnosis not present

## 2018-04-09 DIAGNOSIS — Z7901 Long term (current) use of anticoagulants: Secondary | ICD-10-CM | POA: Diagnosis not present

## 2018-04-09 DIAGNOSIS — I69322 Dysarthria following cerebral infarction: Secondary | ICD-10-CM | POA: Diagnosis not present

## 2018-04-09 DIAGNOSIS — N183 Chronic kidney disease, stage 3 (moderate): Secondary | ICD-10-CM | POA: Diagnosis not present

## 2018-04-09 DIAGNOSIS — G4733 Obstructive sleep apnea (adult) (pediatric): Secondary | ICD-10-CM | POA: Diagnosis not present

## 2018-04-09 DIAGNOSIS — F0150 Vascular dementia without behavioral disturbance: Secondary | ICD-10-CM | POA: Diagnosis not present

## 2018-04-09 DIAGNOSIS — I130 Hypertensive heart and chronic kidney disease with heart failure and stage 1 through stage 4 chronic kidney disease, or unspecified chronic kidney disease: Secondary | ICD-10-CM | POA: Diagnosis not present

## 2018-04-09 DIAGNOSIS — I5032 Chronic diastolic (congestive) heart failure: Secondary | ICD-10-CM | POA: Diagnosis not present

## 2018-04-09 DIAGNOSIS — M170 Bilateral primary osteoarthritis of knee: Secondary | ICD-10-CM | POA: Diagnosis not present

## 2018-04-09 DIAGNOSIS — I480 Paroxysmal atrial fibrillation: Secondary | ICD-10-CM | POA: Diagnosis not present

## 2018-04-09 DIAGNOSIS — I69351 Hemiplegia and hemiparesis following cerebral infarction affecting right dominant side: Secondary | ICD-10-CM | POA: Diagnosis not present

## 2018-04-09 DIAGNOSIS — I69318 Other symptoms and signs involving cognitive functions following cerebral infarction: Secondary | ICD-10-CM | POA: Diagnosis not present

## 2018-04-09 DIAGNOSIS — D631 Anemia in chronic kidney disease: Secondary | ICD-10-CM | POA: Diagnosis not present

## 2018-04-12 ENCOUNTER — Other Ambulatory Visit: Payer: Self-pay | Admitting: Nurse Practitioner

## 2018-04-14 ENCOUNTER — Telehealth: Payer: Self-pay | Admitting: *Deleted

## 2018-04-14 DIAGNOSIS — I5032 Chronic diastolic (congestive) heart failure: Secondary | ICD-10-CM

## 2018-04-14 DIAGNOSIS — F0150 Vascular dementia without behavioral disturbance: Secondary | ICD-10-CM

## 2018-04-14 DIAGNOSIS — R2681 Unsteadiness on feet: Secondary | ICD-10-CM

## 2018-04-14 NOTE — Telephone Encounter (Signed)
Okay to approve DME for bsc and wc per home health

## 2018-04-14 NOTE — Telephone Encounter (Signed)
Rhonda with [facility] called and stated that the husband is requesting an DME Order to be faxed to [facility] for a Bedside Commode and Wheelchair for the patient. Please Advise.

## 2018-04-15 NOTE — Telephone Encounter (Signed)
Printed orders and placed in [place] folder to review and sign.  To be faxed to [facility].

## 2018-04-21 ENCOUNTER — Telehealth: Payer: Self-pay | Admitting: *Deleted

## 2018-04-21 NOTE — Telephone Encounter (Signed)
Yes okay to increase lasix to 40 mg for 3 days then resume lasix 20 mg daily. Please have her wear her compression hose- on in the AM and off at bedtime. Continue to elevate legs throughout the day and prop with pillow at bedtime

## 2018-04-21 NOTE — Telephone Encounter (Signed)
[facility] nurse called and stated that patient has gained 5lbs in 1 week. Currently on 20 mg of Lasix and nurse is wanting to Increase it to 40mg  for 3 days. Left leg huge and tight with some swelling in her feet. Please Advise.   Patient called c/o swelling   1. Do you wear compression hose? No  2. Are you taking any fluid/water pills (check medication list) Lasix 20mg   3. Are you elevating your feet at rest? Yes  4. Have you increased your salt intake? No  5. Any shortness of breath? No  6. Any chest pain? No  7. Are you having any swelling in your ankles or feet? Feet are swelling and Left Leg  8. Any distortion of your abdomen? No  9. Any changes in medications? No  10.Have you noticed weight gain? Yes up 5lbs in 1 week  11. Check patient's chart for history of heart failure, chronic kidney disease, or cirrhosis of the liver.   I will forward this information to your provider and call with response. If your symptoms persist or progress seek medical attention at your nearest urgent care or emergency room. Patient verbalized understanding.

## 2018-04-22 NOTE — Telephone Encounter (Signed)
[facility] Notified and will contact patient.

## 2018-04-23 ENCOUNTER — Telehealth: Payer: Self-pay | Admitting: *Deleted

## 2018-04-23 ENCOUNTER — Other Ambulatory Visit: Payer: Self-pay

## 2018-04-23 ENCOUNTER — Ambulatory Visit (INDEPENDENT_AMBULATORY_CARE_PROVIDER_SITE_OTHER): Payer: PPO | Admitting: Infectious Disease

## 2018-04-23 DIAGNOSIS — L03032 Cellulitis of left toe: Secondary | ICD-10-CM | POA: Diagnosis not present

## 2018-04-23 DIAGNOSIS — M25473 Effusion, unspecified ankle: Secondary | ICD-10-CM | POA: Diagnosis not present

## 2018-04-23 DIAGNOSIS — N183 Chronic kidney disease, stage 3 unspecified: Secondary | ICD-10-CM

## 2018-04-23 DIAGNOSIS — Z9889 Other specified postprocedural states: Secondary | ICD-10-CM | POA: Diagnosis not present

## 2018-04-23 DIAGNOSIS — L03116 Cellulitis of left lower limb: Secondary | ICD-10-CM | POA: Diagnosis not present

## 2018-04-23 MED ORDER — CEPHALEXIN 500 MG PO CAPS: 500.0000 mg | ORAL_CAPSULE | Freq: Four times a day (QID) | ORAL | 11 refills | Status: DC

## 2018-04-23 NOTE — Progress Notes (Signed)
Virtual Visit via Telephone Note  I connected with Susan [name] on 04/23/18 at 10:15 AM EDT by telephone and verified that I am speaking with the correct person using two identifiers.   I discussed the limitations, risks, security and privacy concerns of performing an evaluation and management service by telephone and the availability of in person appointments. I also discussed with the patient that there may be a patient responsible charge related to this service. The patient expressed understanding and agreed to proceed.   History of Present Illness:  78 year old African-American lady with multiple medical problems including dementia, CVA with dysarthria and left hemiparesis chronic heart failure and atrial fibrillation who apparently developed erythema and swelling in her left ankle where she thought she had suffered a "spider bite in December.  This progressed because her daughter claims that mom did not tell anyone about it.  Early December she was admitted to the hospital after having been found down at home.  In the ER she was febrile confused, febrile with tachypnea and evidence of left-sided cellulitis.  Blood cultures were taken and she was started initially on vancomycin.  She improved on vancomycin therapy later was discharged on oral Augmentin.  While on Augmentin she was then was then readmitted to the hospital now with purulent drainage from a full-thickness wound wound fevers up to 101.7.  She was again given vancomycin Zosyn with improvement in her erythema and her clinical status.  She is ultimately changed over to ciprofloxacin and doxycycline.  Then readmitted in early February again with evidence of cellulitis and confusion.  She was given intravenous antibiotics in the form of a myosin and ceftriaxone then followed by oral Keflex.  She is but discharged to skilled nursing facility but then again was having erythema of her leg.  She was seen by her primary care physician who initiated  doxycycline but is found no improvement in her erythema.   Her leg was imaged with plain films and CT of the tib-fib which was without any evidence of osteomyelitis.  I saw her on March 5 on exam her left leg is markedly more swollen and edematous than the right 1.  She has venous stasis changes and exquisite tenderness palpation of the ankle and of the calf.  She did have DVT ruled out when she was in the hospital.  She denies any history of surgery in the left leg or inguinal area she denies trauma to this area she did have cellulitis many years ago in this leg.   This was the appearance on that date:        Initiated Keflex 500 mg 4 times daily for her.  She believed that she had only been taking this for 5 days while she was still in the skilled nursing facility.  However when she got her daughter on the phone with her her daughter assured mom as well as me that she had been filling mom's pillbox and that included in the pills was that Keflex.  Apparently there is not been much change in the appearance of her leg in the interim.  She has had recent increased bilateral edema and is taking furosemide for this.  In the interim we had already imaged her with an MRI and this showed no evidence of deep infection.  Otherwise feels well and has no fevers nausea or malaise.  She has been having no episodes of confusion recently.  She is cooking meals for herself.  Her daughter brings her medications  from the pharmacy and she was also delivered to her house.  Is complaining of more left-sided knee pain as well.  She was scheduled to have a total knee arthroplasty but this has been delayed due to her recurrent infections and certainly is now not an urgent procedure.   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)    Family history of anesthesia complication    "some wake up during OR; some are hard to wake up; some both" (07/29/2012)   GERD (gastroesophageal reflux disease)    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 (HCC)    History of CVA (cerebrovascular accident) 02/01/2015   History of stomach ulcers 1980's   Hyperlipidemia    Hyperparathyroidism, primary ([place]) 04/22/2012   Hypertensive heart disease    Incontinent of urine    wears pads   Major depressive disorder, recurrent episode, severe ([place]) 11/21/2015   OSA on CPAP    Osteoarthritis of right knee    Pedal edema    Persistent atrial fibrillation    CHADS2VASC score is 7 - on chronic anticoagulation with Apixaban   Spinal stenosis    Umbilical hernia    "unrepaired" (07/29/2012)   Varicose veins    "BLE" (07/29/2012)   Vascular dementia [facility])     Past Surgical History:  Procedure Laterality Date   CATARACT EXTRACTION     CHOLECYSTECTOMY  ~ 2010   COLONOSCOPY     IUD REMOVAL  1980's   PARATHYROIDECTOMY N/A 06/16/2012   Procedure: NECK EXPLORATION AND LEFT SUPERIOR PARATHYROIDECTOMY;  Surgeon: Earnstine Regal, MD;  Location: [facility];  Service: General;  Laterality: N/A;   TOTAL KNEE ARTHROPLASTY Right 07/29/2012   TOTAL KNEE ARTHROPLASTY Right 07/29/2012   Procedure: TOTAL KNEE ARTHROPLASTY;  Surgeon: Ninetta Lights, MD;  Location: [facility];  Service: Orthopedics;  Laterality: Right;    Family History  Problem Relation Age of Onset   Hypertension Mother        deceased   Stroke Mother    Breast cancer Mother    Lung cancer Father        deceased   Diabetes Daughter    Hypertension Daughter       Social History   Socioeconomic History   Marital status: Widowed    Spouse name: Not on file   Number of children: Not on file   Years  of education: Not on file   Highest education level: Not on file  Occupational History   Occupation: retired    Fish farm manager: RETIRED  Social Designer, fashion/clothing strain: Not hard at all   Food insecurity:    Worry: Never true    Inability: Never true   Transportation needs:    Medical: No    Non-medical: No  Tobacco Use   Smoking status: Former Smoker    Packs/day: 1.00    Years: 30.00    Pack years: 30.00    Types: Cigarettes    Last attempt to quit: 01/07/1978    Years since quitting: 40.3   Smokeless tobacco: Never Used  Substance and Sexual Activity   Alcohol use:  No   Drug use: No   Sexual activity: Not Currently    Comment: intercourse age 77,less than 5 secxual partners,  Lifestyle   Physical activity:    Days per week: 3 days    Minutes per session: 30 min   Stress: Rather much  Relationships   Social connections:    Talks on phone: More than three times a week    Gets together: More than three times a week    Attends religious service: More than 4 times per year    Active member of club or organization: No    Attends meetings of clubs or organizations: Never    Relationship status: Widowed  Other Topics Concern   Not on file  Social History Narrative   Lives at home alone   Right-handed   Caffeine: 1 cup of coffee per day         Diet:      Caffeine:      Married, if yes what year:       Do you live in a house, apartment, assisted living, condo, trailer, ect:      Pets:      Current/Past profession:      Exercise:         Living Will: Yes   DNR: No   POA/HPOA: Yes      Functional Status:   Do you have difficulty bathing or dressing yourself? Yes   Do you have difficulty preparing food or eating? No   Do you have difficulty managing your medications? Yes   Do you have difficulty managing your finances? Yes   Do you have difficulty affording your medications? Yes    Allergies  Allergen Reactions   Ace Inhibitors Cough    Lipitor [Atorvastatin] Swelling   Simvastatin Other (See Comments)    Myalgias      Latex Itching     Current Outpatient Medications:    acetaminophen (TYLENOL) 500 MG tablet, Take 1,000 mg by mouth as needed. , Disp: , Rfl:    apixaban (ELIQUIS) 5 MG TABS tablet, Take 1 tablet (5 mg total) by mouth every 12 (twelve) hours., Disp: 60 tablet, Rfl: 4   buPROPion (WELLBUTRIN SR) 200 MG 12 hr tablet, Take 200 mg by mouth daily. , Disp: , Rfl:    calcium carbonate (OSCAL) 1500 (600 Ca) MG TABS tablet, Take by mouth 2 (two) times daily with a meal., Disp: , Rfl:    Calcium Carbonate-Vitamin D 600-400 MG-UNIT tablet, Take 1 tablet by mouth daily., Disp: , Rfl:    carvedilol (COREG) 12.5 MG tablet, Take 1 tablet (12.5 mg total) by mouth 2 (two) times daily with a meal., Disp: 60 tablet, Rfl: 0   cholecalciferol (VITAMIN D) 1000 units tablet, Take 1,000 Units by mouth daily. , Disp: , Rfl:    diclofenac sodium (VOLTAREN) 1 % GEL, Apply 2 g topically 4 (four) times daily., Disp: 1 Tube, Rfl: 5   docusate sodium (COLACE) 100 MG capsule, Take 100 mg by mouth 2 (two) times daily. , Disp: , Rfl:    donepezil (ARICEPT) 10 MG tablet, Take 1 tablet (10 mg total) by mouth at bedtime. For memory loss, Disp: 90 tablet, Rfl: 3   furosemide (LASIX) 20 MG tablet, TAKE 1 TABLET(20 MG) BY MOUTH DAILY (Patient taking differently: 40 mg. ), Disp: 90 tablet, Rfl: 0   gabapentin (NEURONTIN) 300 MG capsule, Take 600 mg by mouth 2 (two) times daily. , Disp: , Rfl:  hydrALAZINE (APRESOLINE) 25 MG tablet, Take 1 tablet (25 mg total) by mouth 3 (three) times daily., Disp: 90 tablet, Rfl: 0   iron polysaccharides (IFEREX 150) 150 MG capsule, Take 150 mg by mouth daily., Disp: , Rfl:    Lidocaine (ASPERCREME LIDOCAINE) 4 % PTCH, Apply 1 patch topically daily., Disp: , Rfl:    losartan (COZAAR) 50 MG tablet, Take 1 tablet (50 mg total) by mouth daily., Disp: 30 tablet, Rfl: 3   Menthol, Topical  Analgesic, (BIOFREEZE) 4 % GEL, Apply 3 oz topically 3 (three) times daily. For knee pain, Disp: 1 Tube, Rfl: 3   Multiple Vitamin (MULTIVITAMIN WITH MINERALS) TABS, Take 1 tablet by mouth daily., Disp: , Rfl:    pantoprazole (PROTONIX) 40 MG tablet, Take 1 tablet (40 mg total) by mouth daily., Disp: 90 tablet, Rfl: 0   pravastatin (PRAVACHOL) 40 MG tablet, Take 40 mg by mouth daily., Disp: , Rfl:    saccharomyces boulardii (FLORASTOR) 250 MG capsule, Take 1 capsule (250 mg total) by mouth 2 (two) times daily., Disp: , Rfl:    sertraline (ZOLOFT) 25 MG tablet, TAKE 1/2 TABLET BY MOUTH TWICE DAILY, Disp: 90 tablet, Rfl: 0   vitamin B-12 (CYANOCOBALAMIN) 1000 MCG tablet, Take 2,000 mcg by mouth daily., Disp: , Rfl:    cephALEXin (KEFLEX) 500 MG capsule, Take 1 capsule (500 mg total) by mouth 4 (four) times daily., Disp: 120 capsule, Rfl: 11   cephALEXin (KEFLEX) 500 MG capsule, Take 1 capsule (500 mg total) by mouth 4 (four) times daily., Disp: 120 capsule, Rfl: 11    Observations/Objective:  She appears to be doing relatively well though the erythema has not improved dramatically.  I think much of this is due to venous stasis changes.  She certainly has not been readmitted to the hospital for any episodes of cellulitis or sepsis since we initiated the cephalexin.  She is also initiating furosemide for lower extremity edema at present.  She is going to make contact with her primary care physician as the home nurse that visits her had suggested that she would benefit from an extra dose of Lasix.   Assessment and Plan:  Recurrent cellulitis: Continue Keflex as before.  She is certainly to contact our clinic if there is any clinical worsening and will immediately try to do some intervention, potentially changing antibiotics  Extremity edema: I think managing this is certainly also important to prevent recurrent episodes cellulitis.  Osteoarthritis: She was scheduled for TKA but this  needs to be delayed in light of her recurrent infections and also in particular in light of risk for novel coronavirus 2019 transmission with unnecessary medical procedures and visits.    Avoiding novel coronavirus 2019: She does have a home health nurse who visits her, her daughter delivers medications to the outside of her house as well as food is also delivered to her but beyond that she has no other contacts and she is sheltering in place.     Follow Up Instructions:  Do an ED visit with her in roughly 1 month's time and have made the appointment for her already.   I discussed the assessment and treatment plan with the patient. The patient was provided an opportunity to ask questions and all were answered. The patient agreed with the plan and demonstrated an understanding of the instructions.   The patient was advised to call back or seek an in-person evaluation if the symptoms worsen or if the condition fails to improve as anticipated.  I provided 24 minutes of non-face-to-face time during this encounter.   Alcide Evener, MD

## 2018-04-23 NOTE — Telephone Encounter (Signed)
It sounds like someone needs to be assisting her with medication to avoid this in the future. She will still need evening doses of medication to continue therapeutic effects if they are twice daily medications. Monitor for bleeding if she took eliquis twice and make sure blood pressure is monitored closely and supervised.  She may have a drop in blood pressure if she has taken all her blood pressure medication twice and put her at risk for falls due to feeling dizzy.

## 2018-04-23 NOTE — Telephone Encounter (Signed)
Rosalie Gums, nurse with [facility] called and stated that patient was concerned that she has taken her morning medications twice today.  8-10 of them Carvedilol Furosemide Gabapentin Pravastatin  Sertraline Hydralazine Red gel pill Small pink pill  Nurse has a couple of nurses that are going to check on her throughout the day. Also daughter is going to check in also.  Should she skip evening medications. Please Advise.

## 2018-04-23 NOTE — Telephone Encounter (Signed)
[facility] Nurse Notified and agreed. She will contact patient and daughter.

## 2018-04-28 ENCOUNTER — Encounter: Payer: Self-pay | Admitting: Nurse Practitioner

## 2018-04-29 ENCOUNTER — Encounter: Payer: Self-pay | Admitting: Infectious Disease

## 2018-04-29 ENCOUNTER — Other Ambulatory Visit: Payer: Self-pay

## 2018-04-29 ENCOUNTER — Ambulatory Visit (INDEPENDENT_AMBULATORY_CARE_PROVIDER_SITE_OTHER): Payer: PPO | Admitting: Infectious Disease

## 2018-04-29 ENCOUNTER — Telehealth: Payer: Self-pay | Admitting: *Deleted

## 2018-04-29 DIAGNOSIS — N183 Chronic kidney disease, stage 3 unspecified: Secondary | ICD-10-CM

## 2018-04-29 DIAGNOSIS — I420 Dilated cardiomyopathy: Secondary | ICD-10-CM | POA: Diagnosis not present

## 2018-04-29 DIAGNOSIS — E669 Obesity, unspecified: Secondary | ICD-10-CM | POA: Diagnosis not present

## 2018-04-29 DIAGNOSIS — I872 Venous insufficiency (chronic) (peripheral): Secondary | ICD-10-CM | POA: Diagnosis not present

## 2018-04-29 DIAGNOSIS — I5032 Chronic diastolic (congestive) heart failure: Secondary | ICD-10-CM | POA: Diagnosis not present

## 2018-04-29 HISTORY — DX: Venous insufficiency (chronic) (peripheral): I87.2

## 2018-04-29 NOTE — Progress Notes (Signed)
Virtual Visit via Telephone Note  I connected with [name] on 04/29/18 at  4:00 PM EDT by telephone and verified that I am speaking with the correct person using two identifiers.   I discussed the limitations, risks, security and privacy concerns of performing an evaluation and management service by telephone and the availability of in person appointments. I also discussed with the patient that there may be a patient responsible charge related to this service. The patient expressed understanding and agreed to proceed.   History of Present Illness:   Mrs. Korf had been contacting our office due to her concerns about worsening edema more so in her left lower extremity versus her right.  She had taken a photo and given it to 1 of her daughters who would also send this image to [place].  Apparently there was some concern on the part of patient that somehow the antibiotics were making her leg more swollen.  There may also been some concern about this meaning that she was developing worsening infection at this place.  To try to help get clarity on the matter we schedule her for an ED visit.  I reached out to Ms. Rather and she put 1 daughter on the phone call with her and then subsequently another 1.  From talking to Ms. Kearl is clear that she has no fevers chills nausea anorexia or other systemic evidence of infection.  While her left leg is more swollen than the right it is been chronically this way.  Note deep venous thromboses have been excluded on numerous occasions.  An MRI was obtained recently by myself as well which did not show any evidence of deep infection here.  She has had chronic venous stasis changes along with shininess and tenderness to palpation in this area but it is diffuse and not specific to 1 part of her leg.  She has not developed worsening erythema but simply worsening edema on that side more so than her right side where she also has edema.  I had noted that she had  had her diuretics recently increased from 20 mg a day to 40 mg a day and then titrated back down to 20 mg a day.  In talking to her it sounds more likely that she is developing worsening lower extremity edema and the constant attacks of diastolic heart failure.  When I asked her about her weight she says that she has been gaining weight nearly every day and probably gained about 5 pounds in the last week.  I have asked her to track her daily weights and record them.  Have also asked her to measure the circumference of her left lower extremity using measure tape at a clear point such as below the patella.  I have counseled her to get in touch with primary care with regards to upping her diuretic dose as it seems to me that at present the patient's heart failure is worsening in terms of her lower extremity edema.  I do not know if there may be some dietary indiscretions that are also playing a role.     Observations/Objective:  Mrs. Strength appears to have worsening lower extremity edema left worse than right does not seem to have much in the way of symptomatology to suggest an acute episode of cellulitis.  Not quite sure why she thinks the antibiotics could be contributing to her lower extremity edema.  That might make sense in the context of intravenous antibiotics with a high salt  load but I do not see how that would play a role here.  Rather it seems to me that she is having worsening edema due to worsening heart failure and need for increased diuretics and/or correction of dietary indiscretions.  I am not anxious about DVT since this is been a chronic finding and she has had multiple Dopplers examining this.  Additionally she apparently has a Baker's cyst on the side that could also be contributing to some of the symptomatology.    Assessment and Plan:  Worsening lower extremity edema left worse than right: Have counseled her again touch with her primary care physician Sherrie Mustache to  have diuretics adjusted diet revisit revised and other tactics employed to help her better track her fluid balance.  Dementia: While she seems fairly with it is listed in the problem list and I wonder if it is but difficult for her at times living at the assisted living but not perhaps seeing her daughters in person as frequently as in the past.  I do not note that may be playing a role as well but certainly keeping up with her medical conditions is not a trivial matter for anyone.  Histories of episodes of recurrent cellulitis: I do not think that she is having such an episode at present and I would like to keep her on this Keflex to see if this does prevent any recurrences for now.  I would prefer to see her in person to assess her but I feel the risk of her coming to physicians offices at the age of 84 with her multiple comorbidities is not worth the benefit especially in the light of the novel coronavirus 2019 infection  Follow Up Instructions:    I discussed the assessment and treatment plan with the patient. The patient was provided an opportunity to ask questions and all were answered. The patient agreed with the plan and demonstrated an understanding of the instructions.   The patient was advised to call back or seek an in-person evaluation if the symptoms worsen or if the condition fails to improve as anticipated.  I provided 30 minutes of non-face-to-face time during this encounter.   Alcide Evener, MD

## 2018-04-29 NOTE — Telephone Encounter (Signed)
Patient called and stated that she spoke with Dr. Drucilla Schmidt on 4/16 and given Keflex for her Cellulitis. Patient stated that she has started the antibiotic but her leg is swelling more.  I instructed patient to Call Dr. Arlyss Queen office to speak with them concerning this. Patient agreed and stated that she will call them. Patient will keep [place] informed.

## 2018-04-29 NOTE — Telephone Encounter (Signed)
Routed to Lauree Chandler, NP

## 2018-04-30 ENCOUNTER — Ambulatory Visit (INDEPENDENT_AMBULATORY_CARE_PROVIDER_SITE_OTHER): Payer: PPO | Admitting: Nurse Practitioner

## 2018-04-30 DIAGNOSIS — N183 Chronic kidney disease, stage 3 unspecified: Secondary | ICD-10-CM

## 2018-04-30 DIAGNOSIS — F0150 Vascular dementia without behavioral disturbance: Secondary | ICD-10-CM

## 2018-04-30 DIAGNOSIS — L03116 Cellulitis of left lower limb: Secondary | ICD-10-CM

## 2018-04-30 DIAGNOSIS — R600 Localized edema: Secondary | ICD-10-CM

## 2018-04-30 DIAGNOSIS — M1712 Unilateral primary osteoarthritis, left knee: Secondary | ICD-10-CM

## 2018-04-30 MED ORDER — DONEPEZIL HCL 10 MG PO TABS: 10.0000 mg | ORAL_TABLET | Freq: Every day | ORAL | 3 refills | Status: DC

## 2018-04-30 NOTE — Telephone Encounter (Signed)
Televisit done today.

## 2018-04-30 NOTE — Progress Notes (Signed)
This service is provided via telemedicine  No vital signs collected/recorded due to the encounter was a telemedicine visit.   Location of patient (ex: home, work): Home   Patient consents to a telephone visit:Yes    Location of the provider (ex: office, home): Office   Name of any referring provider:Jessicae Eubanks NP   Names of all persons participating in the telemedicine service and their role in the encounter: Susan [name] CMA, Susan [name] patient, Daughter Susan [name] and daughter Susan [name]  Time spent on [place] spent 24 min  Patient has a tele visit to discuss leg swelling and weight gain   Virtual Visit via Telephone Note  I connected with Susan [name] on 04/30/18 at  1:30 PM EDT by telephone and verified that I am speaking with the correct person using two identifiers.   I discussed the limitations, risks, security and privacy concerns of performing an evaluation and management service by telephone and the availability of in person appointments. I also discussed with the patient that there may be a patient responsible charge related to this service. The patient expressed understanding and agreed to proceed.     Careteam: Patient Care Team: Susan Chandler, NP as PCP - General (Geriatric Medicine) Susan Margarita, MD as PCP - Cardiology (Cardiology)  Advanced Directive information Does Patient Have a Medical Advance Directive?: Yes, Type of Advance Directive: Out of facility DNR (pink MOST or yellow form), Pre-existing out of facility DNR order (yellow form or pink MOST form): Yellow form placed in chart (order not valid for inpatient use), Does patient want to make changes to medical advance directive?: No - Patient declined  Allergies  Allergen Reactions  . Ace Inhibitors Cough  . Lipitor [Atorvastatin] Swelling  . Simvastatin Other (See Comments)    Myalgias     . Latex Itching    Chief Complaint  Patient presents with  . Acute Visit    tele visit leg  swelling, weight gain      HPI: Patient is a 78 y.o. female due to ongoing swelling in left leg.  Pt and daughters have concerned over left left swelling that is ongoing since recurrent cellulitis started in December 2019. Daughters report that every time she starts an antibiotic the swelling goes down and then once it is complete swelling worsens. Noticed increase in swelling in left leg just prior to the weekend and therefore recently (5 days ago) restarted keflex (she has refills provided by ID. On the phone one daughter states she feels like swelling has improved other daughter states that it has not improved.   Did video visit with ID yesterday. There is no redness or heat to leg and low suspicion that this is recurrent acute cellulitis. DVT has been ruled out multiple times. There is no fever, myalgias, no shortness of breath, no cough.  Not wearing compression hose (getting them on and off is a hassle), she has chronic OA of the left knee with chronic pain to the knee denies acte leg pain  Swelling will improve with elevation of leg but she does not do this consisently.    Review of Systems:  Review of Systems  Constitutional: Negative for chills and fever.  Respiratory: Negative for cough and shortness of breath.   Cardiovascular: Positive for leg swelling. Negative for chest pain.  Gastrointestinal: Negative for abdominal pain, diarrhea and vomiting.  Musculoskeletal: Positive for joint pain (left knee).  Neurological: Negative for dizziness and headaches.  Psychiatric/Behavioral: Positive for memory  loss.   Past Medical History:  Diagnosis Date  . Anxiety   . Arthritis    "legs, back" (07/29/2012)  . Cellulitis and abscess of leg 12/29/2017  . Chronic diastolic (congestive) heart failure ([place])   . Chronic lower back pain   . Complication of anesthesia    "I have apnea" (07/29/2012)  . Dementia ([place]) 03/12/2018  . 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 Family  history of anesthesia complication    "some wake up during OR; some are hard to wake up; some both" (07/29/2012)  . GERD (gastroesophageal reflux disease)   . 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 ([place])   . History of CVA (cerebrovascular accident) 02/01/2015  . History of stomach ulcers 1980's  . Hyperlipidemia   . Hyperparathyroidism, primary ([place]) 04/22/2012  . Hypertensive heart disease   . Incontinent of urine    wears pads  . Major depressive disorder, recurrent episode, severe ([place]) 11/21/2015  . OSA on CPAP   . Osteoarthritis of right knee   . Pedal edema   . Persistent atrial fibrillation    CHADS2VASC score is 7 - on chronic anticoagulation with Apixaban  . Spinal stenosis   . Umbilical hernia    "unrepaired" (07/29/2012)  . Varicose veins    "BLE" (07/29/2012)  . Vascular dementia ([place])   . Venous stasis dermatitis of left lower extremity 04/29/2018   Past Surgical History:  Procedure Laterality Date  . CATARACT EXTRACTION    . CHOLECYSTECTOMY  ~ 2010  . COLONOSCOPY    . IUD REMOVAL  1980's  . PARATHYROIDECTOMY N/A 06/16/2012   Procedure: NECK EXPLORATION AND LEFT SUPERIOR PARATHYROIDECTOMY;  Surgeon: Earnstine Regal, MD;  Location: [facility];  Service: General;  Laterality: N/A;  . 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 TOTAL KNEE ARTHROPLASTY Right 07/29/2012   Procedure: TOTAL KNEE ARTHROPLASTY;  Surgeon: Ninetta Lights, MD;  Location: [facility];  Service: Orthopedics;  Laterality: Right;   Social History:   reports that she quit smoking about 40 years ago. Her smoking use included cigarettes. She has a 30.00 pack-year smoking history. She has never used smokeless tobacco. She reports that she does not drink alcohol or use drugs.  Family History  Problem Relation Age of Onset  . Hypertension Mother        deceased  . Stroke Mother   . Breast cancer Mother   . Lung cancer Father        deceased  . Diabetes Daughter   . Hypertension Daughter      Medications: Patient's Medications  New Prescriptions   No medications on file  Previous Medications   ACETAMINOPHEN (TYLENOL) 500 MG TABLET    Take 1,000 mg by mouth as needed.    APIXABAN (ELIQUIS) 5 MG TABS TABLET    Take 1 tablet (5 mg total) by mouth every 12 (twelve) hours.   BUPROPION (WELLBUTRIN SR) 200 MG 12 HR TABLET    Take 200 mg by mouth daily.    CALCIUM CARBONATE-VITAMIN D 600-400 MG-UNIT TABLET    Take 1 tablet by mouth daily.   CARVEDILOL (COREG) 12.5 MG TABLET    Take 1 tablet (12.5 mg total) by mouth 2 (two) times daily with a meal.   CEPHALEXIN (KEFLEX) 500 MG CAPSULE    Take 1 capsule (500 mg total) by mouth 4 (four) times daily.   DICLOFENAC SODIUM (VOLTAREN) 1 % GEL    Apply 2 g topically 4 (  four) times daily.   DOCUSATE SODIUM (COLACE) 100 MG CAPSULE    Take 100 mg by mouth 2 (two) times daily.    DONEPEZIL (ARICEPT) 10 MG TABLET    Take 1 tablet (10 mg total) by mouth at bedtime. For memory loss   FUROSEMIDE (LASIX) 20 MG TABLET    TAKE 1 TABLET(20 MG) BY MOUTH DAILY   GABAPENTIN (NEURONTIN) 300 MG CAPSULE    Take 600 mg by mouth 2 (two) times daily.    HYDRALAZINE (APRESOLINE) 25 MG TABLET    Take 1 tablet (25 mg total) by mouth 3 (three) times daily.   IRON POLYSACCHARIDES (IFEREX 150) 150 MG CAPSULE    Take 150 mg by mouth daily.   LOSARTAN (COZAAR) 50 MG TABLET    Take 1 tablet (50 mg total) by mouth daily.   MENTHOL, TOPICAL ANALGESIC, (BIOFREEZE) 4 % GEL    Apply 3 oz topically 3 (three) times daily. For knee pain   MULTIPLE VITAMIN (MULTIVITAMIN WITH MINERALS) TABS    Take 1 tablet by mouth daily.   PANTOPRAZOLE (PROTONIX) 40 MG TABLET    Take 1 tablet (40 mg total) by mouth daily.   PRAVASTATIN (PRAVACHOL) 40 MG TABLET    Take 40 mg by mouth daily.   SERTRALINE (ZOLOFT) 25 MG TABLET    TAKE 1/2 TABLET BY MOUTH TWICE DAILY   VITAMIN B-12 (CYANOCOBALAMIN) 1000 MCG TABLET    Take 2,000 mcg by mouth daily.  Modified Medications   No medications on file   Discontinued Medications   CALCIUM CARBONATE (OSCAL) 1500 (600 CA) MG TABS TABLET    Take by mouth 2 (two) times daily with a meal.   CEPHALEXIN (KEFLEX) 500 MG CAPSULE    Take 1 capsule (500 mg total) by mouth 4 (four) times daily.   CHOLECALCIFEROL (VITAMIN D) 1000 UNITS TABLET    Take 1,000 Units by mouth daily.    LIDOCAINE (ASPERCREME LIDOCAINE) 4 % PTCH    Apply 1 patch topically daily.   SACCHAROMYCES BOULARDII (FLORASTOR) 250 MG CAPSULE    Take 1 capsule (250 mg total) by mouth 2 (two) times daily.     Physical Exam:  Labs reviewed: Basic Metabolic Panel: Recent Labs    09/05/17 0803  02/06/18 1429  03/02/18 1353 03/09/18 1001 03/12/18 1503  NA 142   < > 141   < > 143 142 143  K 4.2   < > 4.2   < > 4.3 3.9 4.1  CL 109   < > 106   < > 109 106 109  CO2 26   < > 27   < > 27 27 27   GLUCOSE 87   < > 123   < > 79 89 87  BUN 24   < > 21   < > 25 24 26*  CREATININE 1.46*   < > 1.43*   < > 1.30* 1.41* 1.48*  CALCIUM 10.3   < > 10.7*   < > 10.2 10.6* 10.1  TSH 1.95  --  1.61  --   --   --   --    < > = values in this interval not displayed.   Liver Function Tests: Recent Labs    12/21/17 1527 12/29/17 1025 02/06/18 1429 02/14/18 0242  AST 48* 38 27 23  ALT 29 33 15 14  ALKPHOS 50 76  --  58  BILITOT 0.6 1.2 0.4 0.4  PROT 6.9 6.0* 6.8 6.0*  ALBUMIN 3.6  2.2*  --  3.0*   No results for input(s): LIPASE, AMYLASE in the last 8760 hours. No results for input(s): AMMONIA in the last 8760 hours. CBC: Recent Labs    03/02/18 1353 03/09/18 1001 03/12/18 1503  WBC 3.5* 3.7* 3.2*  NEUTROABS 1,939 2,202 1,696  HGB 9.9* 10.1* 9.7*  HCT 29.6* 30.3* 29.7*  MCV 88.4 88.3 89.2  PLT 319 273 253   Lipid Panel: Recent Labs    09/05/17 0803 02/13/18 0423  CHOL 199 179  HDL 65 63  LDLCALC 117* 103*  TRIG 73 65  CHOLHDL 3.1 2.8   TSH: Recent Labs    09/05/17 0803 02/06/18 1429  TSH 1.95 1.61   A1C: Lab Results  Component Value Date   HGBA1C 5.9 (H) 02/13/2018      Assessment/Plan 1. Cellulitis of left lower extremity Completed antibiotic and resolved. Pt restarted keflex due to swelling however ID does not feel like swelling is related to cellulitis. She is not having any warmth, pain or redness to LE.   2. Vascular dementia without behavioral disturbance (HCC) Ongoing, refill provied - donepezil (ARICEPT) 10 MG tablet; Take 1 tablet (10 mg total) by mouth at bedtime. For memory loss  Dispense: 90 tablet; Refill: 3  3. CKD (chronic kidney disease) stage 3, GFR 30-59 ml/min (HCC) -noted. Will need to recheck BMP in 1 week to follow.   4. Leg edema, left -discuss that this is likely multi-factoral from venous insuffiencey, OA, low protein. Pt without any shortness of breath or cough at this time but has CHF. -will increase lasix to 40 mg by mouth daily for 1 week and to have BMP (family request for facility to check lab and will contact them regarding this) checked to follow up renal function. Also encouraged to increase protein in diet, elevate LE as tolerates above the level or the heart and to use compression hose. Low sodium diet encouraged.   5. Primary osteoarthritis of left knee Ongoing.    Carlos American. Harle Battiest  [facility] [phone]   Follow Up Instructions:    I discussed the assessment and treatment plan with the patient. The patient was provided an opportunity to ask questions and all were answered. The patient agreed with the plan and demonstrated an understanding of the instructions.   The patient was advised to call back or seek an in-person evaluation if the symptoms worsen or if the condition fails to improve as anticipated.  I provided 32 minutes of non-face-to-face time during this encounter.   Susan Chandler, NP

## 2018-04-30 NOTE — Patient Instructions (Addendum)
To increase lasix to 40 mg daily x1 week to see if this improves swelling- will need to obtain blood work on 4/28 or 4/29 to recheck kidney function and electrolytes.  To use compression hose (or socks) as tolerates during the day.  Important to keep leg elevated above the level or the heart as tolerates.   Low sodium diet  To increase protein in diet- make sure you are eating 3 meals a day plus ensure (or other nutritional supplement)

## 2018-05-01 ENCOUNTER — Telehealth: Payer: Self-pay | Admitting: *Deleted

## 2018-05-01 NOTE — Telephone Encounter (Signed)
Susan [name] with Susan [name] called and left message on Clinical intake wanting verbal orders to repeat bloodwork on patient through [place].   Tried Mining engineer back and had to leave voicemail to return call.   Per Jessica's OV note dated 04/30/18 :  CKD (chronic kidney disease) stage 3, GFR 30-59 ml/min (HCC)  -noted. Will need to recheck BMP in 1 week to follow.  4. Leg edema, left  -discuss that this is likely multi-factoral from venous insuffiencey, OA, low protein. Pt without any shortness of breath or cough at this time but has CHF.  -will increase lasix to 40 mg by mouth daily for 1 week and to have BMP (family request for facility to check lab and will contact them regarding this) checked to follow up renal function. Also encouraged to increase protein in diet, elevate LE as tolerates above the level or the heart

## 2018-05-04 NOTE — Telephone Encounter (Signed)
Verbal Order given.  Susan [name] requested updated medication list to be faxed to [phone] attn Wynona Neat for bubble packs. Faxed.

## 2018-05-07 DIAGNOSIS — I5032 Chronic diastolic (congestive) heart failure: Secondary | ICD-10-CM | POA: Diagnosis not present

## 2018-05-08 DIAGNOSIS — M170 Bilateral primary osteoarthritis of knee: Secondary | ICD-10-CM | POA: Diagnosis not present

## 2018-05-08 DIAGNOSIS — D631 Anemia in chronic kidney disease: Secondary | ICD-10-CM | POA: Diagnosis not present

## 2018-05-08 DIAGNOSIS — I5032 Chronic diastolic (congestive) heart failure: Secondary | ICD-10-CM | POA: Diagnosis not present

## 2018-05-08 DIAGNOSIS — G4733 Obstructive sleep apnea (adult) (pediatric): Secondary | ICD-10-CM | POA: Diagnosis not present

## 2018-05-08 DIAGNOSIS — Z7901 Long term (current) use of anticoagulants: Secondary | ICD-10-CM | POA: Diagnosis not present

## 2018-05-08 DIAGNOSIS — Z9181 History of falling: Secondary | ICD-10-CM | POA: Diagnosis not present

## 2018-05-08 DIAGNOSIS — N183 Chronic kidney disease, stage 3 (moderate): Secondary | ICD-10-CM | POA: Diagnosis not present

## 2018-05-08 DIAGNOSIS — F0150 Vascular dementia without behavioral disturbance: Secondary | ICD-10-CM | POA: Diagnosis not present

## 2018-05-08 DIAGNOSIS — I130 Hypertensive heart and chronic kidney disease with heart failure and stage 1 through stage 4 chronic kidney disease, or unspecified chronic kidney disease: Secondary | ICD-10-CM | POA: Diagnosis not present

## 2018-05-08 DIAGNOSIS — I69351 Hemiplegia and hemiparesis following cerebral infarction affecting right dominant side: Secondary | ICD-10-CM | POA: Diagnosis not present

## 2018-05-08 DIAGNOSIS — M4800 Spinal stenosis, site unspecified: Secondary | ICD-10-CM | POA: Diagnosis not present

## 2018-05-08 DIAGNOSIS — F332 Major depressive disorder, recurrent severe without psychotic features: Secondary | ICD-10-CM | POA: Diagnosis not present

## 2018-05-08 DIAGNOSIS — I69322 Dysarthria following cerebral infarction: Secondary | ICD-10-CM | POA: Diagnosis not present

## 2018-05-08 DIAGNOSIS — I69318 Other symptoms and signs involving cognitive functions following cerebral infarction: Secondary | ICD-10-CM | POA: Diagnosis not present

## 2018-05-08 DIAGNOSIS — I480 Paroxysmal atrial fibrillation: Secondary | ICD-10-CM | POA: Diagnosis not present

## 2018-05-13 ENCOUNTER — Other Ambulatory Visit: Payer: Self-pay

## 2018-05-13 ENCOUNTER — Ambulatory Visit (INDEPENDENT_AMBULATORY_CARE_PROVIDER_SITE_OTHER): Payer: PPO | Admitting: Adult Health

## 2018-05-13 ENCOUNTER — Encounter: Payer: Self-pay | Admitting: Adult Health

## 2018-05-13 DIAGNOSIS — L03116 Cellulitis of left lower limb: Secondary | ICD-10-CM | POA: Diagnosis not present

## 2018-05-13 DIAGNOSIS — E559 Vitamin D deficiency, unspecified: Secondary | ICD-10-CM

## 2018-05-13 DIAGNOSIS — I130 Hypertensive heart and chronic kidney disease with heart failure and stage 1 through stage 4 chronic kidney disease, or unspecified chronic kidney disease: Secondary | ICD-10-CM

## 2018-05-13 DIAGNOSIS — Z7189 Other specified counseling: Secondary | ICD-10-CM | POA: Diagnosis not present

## 2018-05-13 DIAGNOSIS — F32A Depression, unspecified: Secondary | ICD-10-CM

## 2018-05-13 DIAGNOSIS — F329 Major depressive disorder, single episode, unspecified: Secondary | ICD-10-CM

## 2018-05-13 DIAGNOSIS — M1712 Unilateral primary osteoarthritis, left knee: Secondary | ICD-10-CM | POA: Diagnosis not present

## 2018-05-13 MED ORDER — SERTRALINE HCL 25 MG PO TABS: 25.0000 mg | ORAL_TABLET | Freq: Two times a day (BID) | ORAL | 0 refills | Status: DC

## 2018-05-13 MED ORDER — SACCHAROMYCES BOULARDII 250 MG PO CAPS: 250.0000 mg | ORAL_CAPSULE | Freq: Two times a day (BID) | ORAL | 0 refills | Status: DC

## 2018-05-13 NOTE — Addendum Note (Signed)
Addended by: Durenda Age C on: 05/13/2018 05:03 PM   Modules accepted: Level of Service

## 2018-05-13 NOTE — Addendum Note (Signed)
Addended by: Durenda Age C on: 05/13/2018 05:02 PM   Modules accepted: Level of Service

## 2018-05-13 NOTE — Patient Instructions (Addendum)
1. Depression, unspecified depression type - will increase Zoloft dosage from 12.5 mg BID to 25 mg BID - sertraline (ZOLOFT) 25 MG tablet; Take 1 tablet (25 mg total) by mouth 2 (two) times daily for 30 days.  Dispense: 60 tablet; Refill: 0  2. 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 Primary osteoarthritis of left knee - continue Diclofenac gel 1% and Biofreeze 4% gel topically - will need to be done with her Keflex before making appointment with orthopedics, daughters agreed  4. Advance directive discussed with patient - patient would like to continue being DNR and does not want feeding tubes in the future, will send advance directives packet to patient's home for her to fill up  5. Vitamin D deficiency - continue Cholecalciferol 1,000 units daily and Calcium carbonate-Vitamin D 600-400 mg-unit daily  6. Cellulitis of left lower extremity Lab Results  Component Value Date   WBC 3.2 (L) 03/12/2018  - no fever - continue Keflex and follow-up with infectious disease - saccharomyces boulardii (FLORASTOR) 250 MG capsule; Take 1 capsule (250 mg total) by mouth 2 (two) times daily for 30 days.  Dispense: 60 capsule; Refill: 0

## 2018-05-13 NOTE — Progress Notes (Signed)
This service is provided via telemedicine  No vital signs collected/recorded due to the encounter was a telemedicine visit.   Location of patient (ex: home, work):  Home  Patient consents to a telephone visit:  Yes  Location of the provider (ex: office, home):  [employer], Office   Name of any referring provider:  [employer] [facility], Office   Names of all persons participating in the telemedicine service and their role in the encounter:  Nichollas Perusse Medina-Vargas, NP, Chrae/CMA, patient, daughter [name]), daughter Sharee Pimple)  Time spent on call:  31 min with medical assistant     DATE:  05/13/2018 MRN:  [id number]  BIRTHDAY: [birth year]   Contact Information    Name Relation Home Work Mobile   Topping,[name] Daughter [phone]   Keimya, Briddell Daughter   [phone]       Code Status History    Date Active Date Inactive Code Status Order ID Comments User Context   02/12/2018 1836 02/20/2018 1819 Full Code [id number]  Caren Griffins, MD Inpatient   12/29/2017 1539 01/03/2018 1647 Full Code [id number]  Karmen Bongo, MD Inpatient   12/21/2017 1946 12/25/2017 1959 Full Code [id number]  Domenic Polite, MD Inpatient   01/28/2015 2018 01/31/2015 2223 Full Code [id number]  Samella Parr, NP ED   01/25/2015 0304 01/26/2015 2309 Full Code [id number]  Radene Gunning, NP Inpatient   07/29/2012 1623 07/31/2012 2147 Full Code [id number]  Prudencio Burly Inpatient   06/16/2012 1406 06/17/2012 1412 Full Code [id number]  Earnstine Regal, MD Inpatient    Advance Directive Documentation     Most Recent Value  Type of Advance Directive  --  Pre-existing out of facility DNR order (yellow form or pink MOST form)  Yellow form placed in chart (order not valid for inpatient use)  "MOST" Form in Place?  --       Chief Complaint  Patient presents with   Medical Management of Chronic Issues    2 month follow-up. + depression screening Scored 11. Positive fall risk  (patient answered no in the past to fall screening cause she was unaware that it was for the last year).Telephone visit    Medication Management    Discuss how much Vit D patient should take.    Medication Management    Discuss need for all medications. Daughter would like to know if any medications can be removed.   Advanced Directive    Advance Care Planning.     HISTORY OF PRESENT ILLNESS:  This is an 78 year old female who is having a televisit for a 34-month follow up. Daughters, Tressia Miners and [place], were also on the tele-conference. She was positive for depression when screened (score of 11). She currently takes Zoloft and Wellbutrin for depression. She denies having thoughts of hurting herself. She uses a walker when ambulating. She takes Calcium with vitamin D and Vitamin D daily. They were concerned if that is too much vitamin D. She is currently taking Cephalexin for cellulitis and sees an infectious disease doctor, Dr. Tommy Medal. She said that her legs are still swollen but not worst. She said that her weight is the same but does not know what her weight is. She said that she takes her weight whenever the PT comes over to their house but does not remember them. According to her, her BPs are "good" but does not remember it. She said that her left knee is hurting her, 4/10 pain, when she puts pressure  on it. She has lower back muscle spasm that occurs when she walks and no pain when she is sitting down. She has taken Lasix 20 mg BID X 1 week and has taken the BMP to check if her kidneys has been affected. Creatinine 1.48 and GFR 38 taken on 03/12/18 (on 03/09/18 creatinine 1.41 and GFR 41). Her hgb 9.7, wbc 3.2, platelet 253 (taken 03/12/18). She currently take Niferex daily. The patient and the 2 daughters agree that she is DNR and mentioned that she does not wish to have feeding tubes when her dementia gets worst/stops eating. She has PMH of CVA with dysarthria and left hemiparesis, chronic heart  failure.  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)    Family history of anesthesia complication    "some wake up during OR; some are hard to wake up; some both" (07/29/2012)   GERD (gastroesophageal reflux disease)    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 (HCC)    History of CVA (cerebrovascular accident) 02/01/2015   History of stomach ulcers 1980's   Hyperlipidemia    Hyperparathyroidism, primary ([place]) 04/22/2012   Hypertensive heart disease    Incontinent of urine    wears pads   Major depressive disorder, recurrent episode, 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   SERTRALINE (ZOLOFT) 25 MG TABLET    Take 1 tablet (25 mg total) by mouth 2 (two) times daily for 30 days.  Previous Medications   ACETAMINOPHEN (TYLENOL) 500 MG TABLET    Take 1,000 mg by mouth as needed.    APIXABAN (ELIQUIS) 5 MG TABS TABLET    Take 1 tablet (5 mg total) by mouth every 12 (twelve) hours.   BUPROPION (WELLBUTRIN SR) 200 MG 12 HR TABLET    Take 200 mg by mouth daily.    CALCIUM  CARBONATE-VITAMIN D 600-400 MG-UNIT TABLET    Take 1 tablet by mouth daily.   CARVEDILOL (COREG) 12.5 MG TABLET    Take 1 tablet (12.5 mg total) by mouth 2 (two) times daily with a meal.   CEPHALEXIN (KEFLEX) 500 MG CAPSULE    Take 1 capsule (500 mg total) by mouth 4 (four) times daily.   CHOLECALCIFEROL (VITAMIN D) 25 MCG (1000 UT) TABLET    Take 1,000 Units by mouth daily.   DICLOFENAC SODIUM (VOLTAREN) 1 % GEL    Apply 2 g topically 4 (four) times daily.   DOCUSATE SODIUM (COLACE) 100 MG CAPSULE  Take 100 mg by mouth 2 (two) times daily.    DONEPEZIL (ARICEPT) 10 MG TABLET    Take 1 tablet (10 mg total) by mouth at bedtime. For memory loss   FUROSEMIDE (LASIX) 20 MG TABLET    TAKE 1 TABLET(20 MG) BY MOUTH DAILY   GABAPENTIN (NEURONTIN) 300 MG CAPSULE    Take 600 mg by mouth 2 (two) times daily.    HYDRALAZINE (APRESOLINE) 25 MG TABLET    Take 1 tablet (25 mg total) by mouth 3 (three) times daily.   IRON POLYSACCHARIDES (IFEREX 150) 150 MG CAPSULE    Take 150 mg by mouth daily.   LOSARTAN (COZAAR) 50 MG TABLET    Take 1 tablet (50 mg total) by mouth daily.   MENTHOL, TOPICAL ANALGESIC, (BIOFREEZE) 4 % GEL    Apply 3 oz topically 3 (three) times daily. For knee pain   MULTIPLE VITAMIN (MULTIVITAMIN WITH MINERALS) TABS    Take 1 tablet by mouth daily.   PANTOPRAZOLE (PROTONIX) 40 MG TABLET    Take 1 tablet (40 mg total) by mouth daily.   PRAVASTATIN (PRAVACHOL) 40 MG TABLET    Take 40 mg by mouth daily.   VITAMIN B-12 (CYANOCOBALAMIN) 1000 MCG TABLET    Take 2,000 mcg by mouth daily.  Modified Medications   No medications on file  Discontinued Medications   SACCHAROMYCES BOULARDII (FLORASTOR) 250 MG CAPSULE    Take   SERTRALINE (ZOLOFT) 25 MG TABLET    TAKE 1/2 TABLET BY MOUTH TWICE DAILY     Allergies  Allergen Reactions   Ace Inhibitors Cough   Lipitor [Atorvastatin] Swelling   Simvastatin Other (See Comments)    Myalgias      Latex Itching     REVIEW OF  SYSTEMS:  GENERAL: no change in appetite, no fatigue, no weight changes, no fever, chills or weakness SKIN: Denies rash, itching, wounds, ulcer sores, or nail abnormality EYES: Denies change in vision, dry eyes, eye pain, itching or discharge EARS: Denies change in hearing, ringing in ears, or earache NOSE: Denies nasal congestion or epistaxis MOUTH and THROAT: Denies oral discomfort, gingival pain or bleeding, pain from teeth or hoarseness   RESPIRATORY: no cough, SOB, DOE, wheezing, hemoptysis CARDIAC: no chest pain,or palpitations, +edema GI: no abdominal pain, diarrhea, constipation, heart burn, nausea or vomiting GU: Denies dysuria, frequency, hematuria, incontinence, or discharge MUSCULOSKELETAL: left knee and lower back pain occasionally CIRCULATION: Denies claudication, edema of legs, varicosities, or cold extremities NEUROLOGICAL: Denies dizziness, syncope, numbness, or headache PSYCHIATRIC: + depression    LABS/RADIOLOGY: Labs reviewed: Basic Metabolic Panel: Recent Labs    03/02/18 1353 03/09/18 1001 03/12/18 1503  NA 143 142 143  K 4.3 3.9 4.1  CL 109 106 109  CO2 27 27 27   GLUCOSE 79 89 87  BUN 25 24 26*  CREATININE 1.30* 1.41* 1.48*  CALCIUM 10.2 10.6* 10.1   Liver Function Tests: Recent Labs    12/21/17 1527 12/29/17 1025 02/06/18 1429 02/14/18 0242  AST 48* 38 27 23  ALT 29 33 15 14  ALKPHOS 50 76  --  58  BILITOT 0.6 1.2 0.4 0.4  PROT 6.9 6.0* 6.8 6.0*  ALBUMIN 3.6 2.2*  --  3.0*    CBC: Recent Labs    03/02/18 1353 03/09/18 1001 03/12/18 1503  WBC 3.5* 3.7* 3.2*  NEUTROABS 1,939 2,202 1,696  HGB 9.9* 10.1* 9.7*  HCT 29.6* 30.3* 29.7*  MCV 88.4 88.3 89.2  PLT 319 273 253  Lipid Panel: Recent Labs    09/05/17 0803 02/13/18 0423  HDL 65 63    ASSESSMENT/PLAN:  1. Depression, unspecified depression type - will increase Zoloft dosage from 12.5 mg BID to 25 mg BID - sertraline (ZOLOFT) 25 MG tablet; Take 1 tablet (25 mg total)  by mouth 2 (two) times daily for 30 days.  Dispense: 60 tablet; Refill: 0  2. 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 Primary osteoarthritis of left knee - continue Diclofenac gel 1% and Biofreeze 4% gel topically - will need to be done with her Keflex before making appointment with orthopedics, daughters agreed  4. Advance directive discussed with patient - patient would like to continue being DNR and does not want feeding tubes in the future, will send advance directives packet to patient's home for her to fill up  5. Vitamin D deficiency - continue Cholecalciferol 1,000 units daily and Calcium carbonate-Vitamin D 600-400 mg-unit daily  6. Cellulitis of left lower extremity Lab Results  Component Value Date   WBC 3.2 (L) 03/12/2018   - continue Keflex and follow-up with infectious disease - saccharomyces boulardii (FLORASTOR) 250 MG capsule; Take 1 capsule (250 mg total) by mouth 2 (two) times daily for 30 days.  Dispense: 60 capsule; Refill: 0     Time spent on non face to face visit:  60 minutes  The patient gave consent to this telephone visit. Explained to the patient the risk and privacy issue that was involved with this telephone call.   The patient was advised to call back and ask for an in-person evaluation if the symptoms worsen or if the condition fails to improve.   Durenda Age, NP [employer] [phone]

## 2018-05-18 ENCOUNTER — Ambulatory Visit (INDEPENDENT_AMBULATORY_CARE_PROVIDER_SITE_OTHER): Payer: PPO | Admitting: Infectious Disease

## 2018-05-18 ENCOUNTER — Encounter: Payer: Self-pay | Admitting: Infectious Disease

## 2018-05-18 ENCOUNTER — Other Ambulatory Visit: Payer: Self-pay

## 2018-05-18 ENCOUNTER — Telehealth: Payer: Self-pay | Admitting: *Deleted

## 2018-05-18 DIAGNOSIS — L03119 Cellulitis of unspecified part of limb: Secondary | ICD-10-CM

## 2018-05-18 DIAGNOSIS — M1731 Unilateral post-traumatic osteoarthritis, right knee: Secondary | ICD-10-CM

## 2018-05-18 DIAGNOSIS — L02419 Cutaneous abscess of limb, unspecified: Secondary | ICD-10-CM

## 2018-05-18 DIAGNOSIS — I5032 Chronic diastolic (congestive) heart failure: Secondary | ICD-10-CM | POA: Diagnosis not present

## 2018-05-18 DIAGNOSIS — L03116 Cellulitis of left lower limb: Secondary | ICD-10-CM

## 2018-05-18 DIAGNOSIS — M25473 Effusion, unspecified ankle: Secondary | ICD-10-CM | POA: Diagnosis not present

## 2018-05-18 DIAGNOSIS — I872 Venous insufficiency (chronic) (peripheral): Secondary | ICD-10-CM | POA: Diagnosis not present

## 2018-05-18 MED ORDER — CEPHALEXIN 500 MG PO CAPS: 1000.0000 mg | ORAL_CAPSULE | Freq: Two times a day (BID) | ORAL | 11 refills | Status: DC

## 2018-05-18 NOTE — Telephone Encounter (Signed)
Thank you for following up on this

## 2018-05-18 NOTE — Telephone Encounter (Signed)
Patient daughter, Geraldo Pitter called and stated that she was upset because patient's HomeHealth PT was DENIED and it was because we gave the wrong information to the [facility]. Stated that our wording needed to be different to get the therapy approved.  I tried to explain to the daughter that we did not do the authorization for [place] and that  would go through [place] to do but she kept talking over me and stated that we were not treating her mother like our own mother. I asked her what she wanted me to do and she stated that she wanted me to word the orders to where it would be covered. I told her that I would call [facility] and see what they needed and she hung up on me.   I called [facility] and spoke with [place] and she stated that she spoke with daughter and told her that she would have to call patient's insurance because the [employer], [place] closed the window for the authorization last Friday. She stated that the daughter would have to call the insurance company to request it to be opened and the insurance company would have to call the [employer] to give them verbal ok to continue therapy. She stated that they cannot call them, it has to be the member. She stated that all this was explained fully to the daughter, Geraldo Pitter.   [name] notified and stated that she will call the insurance company.

## 2018-05-18 NOTE — Progress Notes (Signed)
Virtual Visit via Telephone Note  I connected with Susan [name] on 05/18/18 at 10:30 AM EDT by telephone and verified that I am speaking with the correct person using two identifiers.  Location: Patient: Home Provider: [facility]   I discussed the limitations, risks, security and privacy concerns of performing an evaluation and management service by telephone and the availability of in person appointments. I also discussed with the patient that there may be a patient responsible charge related to this service. The patient expressed understanding and agreed to proceed.   History of Present Illness:  78 year old African-American lady with multiple medical problems, including right-sided heart failure obesity asymmetric edema with chronic venous stasis changes and history of recurrent cellulitis.  I have had several telephonic visits with her over the last month.  There was concern about swelling in her leg getting worse and whether or not this was related to infection or even to antibiotics.  She is since had improvement in her edema after being on higher dose diuretic for 5 days.  Her daughter indicates that she lost at least 1 inch in circumference of her leg that they have been measuring as asked them to.  We had a lengthy discussion about whether or not to continue the antibiotics.  I told her and her daughters that at present there certainly not any deep infection that we are treating with antibiotics.  Rather the purpose of the antibiotics is is to prevent her from having episodes of recurrent cellulitis.  For now after much discussion we would like to continue going forward with the Keflex but at a dose of 2 tablets taken in the morning and 2 tablets at nighttime.  We can further continue to look at lower doses of the antibiotics.  It might be even more ideal if we could have the antibiotics given in a reactive way but it seems very difficult and talk to Susan [name] to pin down when she  is clearly having symptomatology consistent with cellulitis.  She seems to have a great deal of difficulty distinguishing this from simply having problems with edema   Observations/Objective:  Chronic lower extremity edema left greater than right  Venous stasis is changes and recurrent cellulitis   Arthritis with need for total knee arthroplasty on the left  Assessment and Plan:  Chronic lower extremity edema: Continue to manage this with optimization of diuretics, good nutrition.  Recurrent cellulitis: We will continue the Keflex for now as described above  Osteoarthritis: I would not be in a hurry to have the total knee replacement done yet but counseled them to consider having it done in August when this pandemic may be on the down turn and it might be safer for her I think rushing into that kind of procedure at the age of 44 in the middle of this pandemic would not be prudent Follow Up Instructions:    I discussed the assessment and treatment plan with the patient. The patient was provided an opportunity to ask questions and all were answered. The patient agreed with the plan and demonstrated an understanding of the instructions.   The patient was advised to call back or seek an in-person evaluation if the symptoms worsen or if the condition fails to improve as anticipated.  I provided 21 minutes of non-face-to-face time during this encounter.   Alcide Evener, MD

## 2018-05-20 ENCOUNTER — Telehealth: Payer: Self-pay | Admitting: *Deleted

## 2018-05-20 NOTE — Telephone Encounter (Signed)
Ann with [facility] called and requested verbal order for Medication Teaching. Verbal order given.  Nurse also requested Medication list to be faxed to her at Fax: [phone]. Faxed.

## 2018-05-21 ENCOUNTER — Other Ambulatory Visit: Payer: Self-pay

## 2018-05-21 DIAGNOSIS — F0150 Vascular dementia without behavioral disturbance: Secondary | ICD-10-CM

## 2018-05-21 DIAGNOSIS — L03116 Cellulitis of left lower limb: Secondary | ICD-10-CM

## 2018-05-21 NOTE — Telephone Encounter (Signed)
Called spoke with [name] patients daughter about if her mother is still taking Eliquis she  said she would check and call back to office

## 2018-05-21 NOTE — Telephone Encounter (Signed)
Patients daughter called and was upset about some prescriptions for her mother that were not being filled at pharmacy I called the pharmacy number that she gave and it was to a totally different pharmacy than who we have listed in our system and the daughter is wanting to have her mother's medication prepackaged I will send all of none controled scripts in electronically but I don't have the prior authorization for the other and will route to [place]

## 2018-05-21 NOTE — Telephone Encounter (Signed)
Can you call daughter and see if she is taking the eliquis, and if so from who. She has not had a refill in our system in 3 years.

## 2018-05-22 ENCOUNTER — Other Ambulatory Visit: Payer: Self-pay | Admitting: *Deleted

## 2018-05-22 DIAGNOSIS — F0150 Vascular dementia without behavioral disturbance: Secondary | ICD-10-CM

## 2018-05-22 MED ORDER — APIXABAN 5 MG PO TABS: 5.0000 mg | ORAL_TABLET | Freq: Two times a day (BID) | ORAL | 4 refills | Status: DC

## 2018-05-22 MED ORDER — ADULT MULTIVITAMIN W/MINERALS CH: 1.0000 | ORAL_TABLET | Freq: Every day | ORAL | 0 refills | Status: DC

## 2018-05-22 MED ORDER — GABAPENTIN 300 MG PO CAPS: 600.0000 mg | ORAL_CAPSULE | Freq: Two times a day (BID) | ORAL | 1 refills | Status: DC

## 2018-05-22 MED ORDER — LOSARTAN POTASSIUM 50 MG PO TABS: 50.0000 mg | ORAL_TABLET | Freq: Every day | ORAL | 3 refills | Status: DC

## 2018-05-22 MED ORDER — CALCIUM CARBONATE-VITAMIN D 600-400 MG-UNIT PO TABS: 1.0000 | ORAL_TABLET | Freq: Every day | ORAL | 2 refills | Status: DC

## 2018-05-22 MED ORDER — BUPROPION HCL ER (SR) 200 MG PO TB12: 200.0000 mg | ORAL_TABLET | Freq: Every day | ORAL | 0 refills | Status: DC

## 2018-05-22 MED ORDER — HYDRALAZINE HCL 25 MG PO TABS: 25.0000 mg | ORAL_TABLET | Freq: Three times a day (TID) | ORAL | 0 refills | Status: DC

## 2018-05-22 MED ORDER — POLYSACCHARIDE IRON COMPLEX 150 MG PO CAPS: 150.0000 mg | ORAL_CAPSULE | Freq: Every day | ORAL | 0 refills | Status: DC

## 2018-05-22 MED ORDER — ACETAMINOPHEN 500 MG PO TABS: 1000.0000 mg | ORAL_TABLET | ORAL | 2 refills | Status: DC | PRN

## 2018-05-22 MED ORDER — PRAVASTATIN SODIUM 40 MG PO TABS: 40.0000 mg | ORAL_TABLET | Freq: Every day | ORAL | 0 refills | Status: DC

## 2018-05-22 MED ORDER — PANTOPRAZOLE SODIUM 40 MG PO TBEC: 40.0000 mg | DELAYED_RELEASE_TABLET | Freq: Every day | ORAL | 0 refills | Status: DC

## 2018-05-22 MED ORDER — CARVEDILOL 12.5 MG PO TABS: 12.5000 mg | ORAL_TABLET | Freq: Two times a day (BID) | ORAL | 0 refills | Status: DC

## 2018-05-22 MED ORDER — VITAMIN B-12 1000 MCG PO TABS: 2000.0000 ug | ORAL_TABLET | Freq: Every day | ORAL | 1 refills | Status: DC

## 2018-05-22 MED ORDER — VITAMIN D3 25 MCG (1000 UNIT) PO TABS: 1000.0000 [IU] | ORAL_TABLET | Freq: Every day | ORAL | 2 refills | Status: DC

## 2018-05-22 MED ORDER — FUROSEMIDE 20 MG PO TABS: ORAL_TABLET | ORAL | 0 refills | Status: DC

## 2018-05-22 MED ORDER — DOCUSATE SODIUM 100 MG PO CAPS: 100.0000 mg | ORAL_CAPSULE | Freq: Two times a day (BID) | ORAL | 0 refills | Status: DC

## 2018-05-22 NOTE — Telephone Encounter (Signed)
Sarah with [facility] called and stated that patient is going to switch to [facility] for Pill paks. Needs all Rx sent to the pharmacy.   Gay Filler has Pended Medications and sent them to [facility] for approval. Awaiting approval.

## 2018-05-25 ENCOUNTER — Other Ambulatory Visit: Payer: Self-pay | Admitting: *Deleted

## 2018-05-25 DIAGNOSIS — F0150 Vascular dementia without behavioral disturbance: Secondary | ICD-10-CM

## 2018-05-25 MED ORDER — DICLOFENAC SODIUM 1 % TD GEL: 2.0000 g | Freq: Four times a day (QID) | TRANSDERMAL | 5 refills | Status: DC

## 2018-05-25 MED ORDER — DONEPEZIL HCL 10 MG PO TABS: 10.0000 mg | ORAL_TABLET | Freq: Every day | ORAL | 1 refills | Status: AC

## 2018-05-25 NOTE — Telephone Encounter (Signed)
Susan [name] with [facility] called and requested.

## 2018-06-16 ENCOUNTER — Other Ambulatory Visit: Payer: Self-pay

## 2018-06-16 ENCOUNTER — Ambulatory Visit (INDEPENDENT_AMBULATORY_CARE_PROVIDER_SITE_OTHER): Payer: PPO

## 2018-06-16 ENCOUNTER — Ambulatory Visit (INDEPENDENT_AMBULATORY_CARE_PROVIDER_SITE_OTHER): Payer: PPO | Admitting: Orthopaedic Surgery

## 2018-06-16 VITALS — Ht 60.0 in | Wt 189.0 lb

## 2018-06-16 DIAGNOSIS — R600 Localized edema: Secondary | ICD-10-CM | POA: Diagnosis not present

## 2018-06-16 DIAGNOSIS — M1712 Unilateral primary osteoarthritis, left knee: Secondary | ICD-10-CM

## 2018-06-16 NOTE — Progress Notes (Signed)
Office Visit Note   Patient: Susan [name]           Date of Birth: 1940-10-16           MRN: [id number] Visit Date: 06/16/2018              Requested by: Lauree Chandler, NP [place], [place] [address] PCP: Lauree Chandler, NP   Assessment & Plan: Visit Diagnoses:  1. Unilateral primary osteoarthritis, left knee     Plan: Impression is left knee end-stage degenerative joint disease and left lower extremity chronic venous stasis and pitting edema.  According to patient's reports and Dr. Arlyss Queen clinic note they are concerned for cellulitis is low at this point.  She does have significant swelling and pitting edema in the left lower extremity which I would be concerned about in terms of postoperative swelling and skin problems if we were to proceed with a left total knee replacement.  We will obtain lab work to rule out infection.  I will also refer her to vascular surgery for evaluation of her severe lower extremity edema.  Hopefully they can help her with this.  I would like to see her back once she has been evaluated by vascular surgery.  Today her daughter participated in the encounter through face time. Total face to face encounter time was greater than 25 minutes and over half of this time was spent in counseling and/or coordination of care.  Follow-Up Instructions: Return if symptoms worsen or fail to improve.   Orders:  Orders Placed This Encounter  Procedures  . XR Knee 1-2 Views Left  . C-reactive protein  . Sed Rate (ESR)  . CBC   No orders of the defined types were placed in this encounter.     Procedures: No procedures performed   Clinical Data: No additional findings.   Subjective: Chief Complaint  Patient presents with  . Left Knee - Follow-up, Pain    And is a very pleasant 78 year old female who comes in for follow-up of her left knee degenerative joint disease.  I previously saw her last year for this.  She continues to have left  lower extremity pitting edema and venous stasis with possible cellulitis.  She has been seeing Dr. Drucilla Schmidt of infectious disease for the last.  She has finished a course of Keflex and she has been off of antibiotics for at least 2 weeks.  She denies any constitutional symptoms.  She wears a compression sock to help with the swelling.   Review of Systems  Constitutional: Negative.   HENT: Negative.   Eyes: Negative.   Respiratory: Negative.   Cardiovascular: Negative.   Endocrine: Negative.   Musculoskeletal: Negative.   Neurological: Negative.   Hematological: Negative.   Psychiatric/Behavioral: Negative.   All other systems reviewed and are negative.    Objective: Vital Signs: Ht 5' (1.524 m)   Wt 189 lb (85.7 kg)   BMI 36.91 kg/m   Physical Exam Vitals signs and nursing note reviewed.  Constitutional:      Appearance: She is well-developed.  HENT:     Head: Normocephalic and atraumatic.  Neck:     Musculoskeletal: Neck supple.  Pulmonary:     Effort: Pulmonary effort is normal.  Abdominal:     Palpations: Abdomen is soft.  Skin:    General: Skin is warm.     Capillary Refill: Capillary refill takes less than 2 seconds.  Neurological:     Mental  Status: She is alert and oriented to person, place, and time.  Psychiatric:        Behavior: Behavior normal.        Thought Content: Thought content normal.        Judgment: Judgment normal.     Ortho Exam      Left knee exam shows no joint effusion with normal range of motion.  Stable to collateral and cruciates. Left lower extremity exam shows 2+ pitting edema and chronic skin changes consistent with chronic venous stasis.  There is no obvious evidence of infection or cellulitis.  There is shiny skin consistent with severe pitting edema. Specialty Comments:  No specialty comments available.  Imaging: Xr Knee 1-2 Views Left  Result Date: 06/16/2018 Severe degenerative joint disease left knee.  Stable appearing right  total knee replacement.    PMFS History: Patient Active Problem List   Diagnosis Date Noted  . Unilateral primary osteoarthritis, left knee 06/16/2018  . Venous stasis dermatitis of left lower extremity 04/29/2018  . Dementia ([place]) 03/12/2018  . Recurrent cellulitis 02/12/2018  . HTN (hypertension) 02/12/2018  . Cellulitis and abscess of leg 12/29/2017  . CKD (chronic kidney disease) stage 3, GFR 30-59 ml/min (HCC) 12/29/2017  . Cellulitis of left lower extremity   . Ankle edema   . Sepsis ([place]) 12/21/2017  . Medication management 10/11/2016  . Hyperlipemia 06/18/2016  . Gait abnormality 06/18/2016  . Major depressive disorder, recurrent episode, severe ([place]) 11/21/2015  . Edema 02/09/2015  . History of CVA (cerebrovascular accident) 02/01/2015  . 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 ([place])   . 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 Chronic low back pain 01/28/2015  . Short-term memory loss 01/28/2015  . Volume depletion 01/28/2015  . Persistent atrial fibrillation 01/26/2015  . DCM (dilated cardiomyopathy) ([place]) 01/26/2015  . TIA (transient ischemic attack) 01/24/2015  . Chronic diastolic (congestive) heart failure ([place])   . Obesity (BMI 30-39.9) 01/27/2013  . Constipation 09/02/2012  . H/O arthroscopy of right knee 08/06/2012  . Anxiety   . Depression   . GERD (gastroesophageal reflux disease)   . Hypertensive heart disease   . OSA (obstructive sleep apnea)   . Osteoarthritis of right knee   . Hyperparathyroidism, primary ([place]) 04/22/2012  . Chronic cough 10/02/2011   Past Medical History:  Diagnosis Date  . Anxiety   . Arthritis    "legs, back" (07/29/2012)  . Cellulitis and abscess of leg 12/29/2017  . Chronic diastolic (congestive) heart failure ([place])   . Chronic lower back pain   . Complication of anesthesia    "I have apnea" (07/29/2012)  . Dementia ([place]) 03/12/2018  . 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  Family history of anesthesia complication    "some wake up during OR; some are hard to wake up; some both" (07/29/2012)  . GERD (gastroesophageal reflux disease)   . 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 ([place])   . History of CVA (cerebrovascular accident) 02/01/2015  . History of stomach ulcers 1980's  . Hyperlipidemia   . Hyperparathyroidism, primary ([place]) 04/22/2012  . Hypertensive heart disease   . Incontinent of urine    wears pads  . Major depressive disorder, recurrent episode, severe ([place]) 11/21/2015  . OSA on CPAP   . Osteoarthritis of right knee   . Pedal edema   . Persistent atrial fibrillation    CHADS2VASC score is 7 - on chronic anticoagulation with  Apixaban  . Spinal stenosis   . Umbilical hernia    "unrepaired" (07/29/2012)  . Varicose veins    "BLE" (07/29/2012)  . Vascular dementia ([place])   . Venous stasis dermatitis of left lower extremity 04/29/2018    Family History  Problem Relation Age of Onset  . Hypertension Mother        deceased  . Stroke Mother   . Breast cancer Mother   . Lung cancer Father        deceased  . Diabetes Daughter   . Hypertension Daughter     Past Surgical History:  Procedure Laterality Date  . CATARACT EXTRACTION    . CHOLECYSTECTOMY  ~ 2010  . COLONOSCOPY    . IUD REMOVAL  1980's  . PARATHYROIDECTOMY N/A 06/16/2012   Procedure: NECK EXPLORATION AND LEFT SUPERIOR PARATHYROIDECTOMY;  Surgeon: Earnstine Regal, MD;  Location: [facility];  Service: General;  Laterality: N/A;  . 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 TOTAL KNEE ARTHROPLASTY Right 07/29/2012   Procedure: TOTAL KNEE ARTHROPLASTY;  Surgeon: Ninetta Lights, MD;  Location: [facility];  Service: Orthopedics;  Laterality: Right;   Social History   Occupational History  . Occupation: retired    Fish farm manager: RETIRED  Tobacco Use  . Smoking status: Former Smoker    Packs/day: 1.00    Years: 30.00    Pack years: 30.00    Types: Cigarettes    Last attempt to  quit: 01/07/1978    Years since quitting: 40.4  . Smokeless tobacco: Never Used  Substance and Sexual Activity  . Alcohol use: No  . Drug use: No  . Sexual activity: Not Currently    Comment: intercourse age 43,less than 5 secxual partners,

## 2018-06-16 NOTE — Addendum Note (Signed)
Addended by: Precious Bard on: 06/16/2018 02:11 PM   Modules accepted: Orders

## 2018-06-17 ENCOUNTER — Other Ambulatory Visit: Payer: Self-pay | Admitting: Adult Health

## 2018-06-17 ENCOUNTER — Encounter: Payer: Self-pay | Admitting: Nurse Practitioner

## 2018-06-17 ENCOUNTER — Other Ambulatory Visit: Payer: Self-pay | Admitting: Nurse Practitioner

## 2018-06-17 ENCOUNTER — Ambulatory Visit (INDEPENDENT_AMBULATORY_CARE_PROVIDER_SITE_OTHER): Payer: PPO | Admitting: Nurse Practitioner

## 2018-06-17 DIAGNOSIS — F32A Depression, unspecified: Secondary | ICD-10-CM

## 2018-06-17 DIAGNOSIS — M1712 Unilateral primary osteoarthritis, left knee: Secondary | ICD-10-CM

## 2018-06-17 DIAGNOSIS — F329 Major depressive disorder, single episode, unspecified: Secondary | ICD-10-CM | POA: Diagnosis not present

## 2018-06-17 DIAGNOSIS — L03116 Cellulitis of left lower limb: Secondary | ICD-10-CM

## 2018-06-17 DIAGNOSIS — R600 Localized edema: Secondary | ICD-10-CM | POA: Diagnosis not present

## 2018-06-17 DIAGNOSIS — I10 Essential (primary) hypertension: Secondary | ICD-10-CM

## 2018-06-17 LAB — CBC
HCT: 32.8 % — ABNORMAL LOW (ref 35.0–45.0)
Hemoglobin: 10.5 g/dL — ABNORMAL LOW (ref 11.7–15.5)
MCH: 29.8 pg (ref 27.0–33.0)
MCHC: 32 g/dL (ref 32.0–36.0)
MCV: 93.2 fL (ref 80.0–100.0)
MPV: 10.7 fL (ref 7.5–12.5)
Platelets: 227 10*3/uL (ref 140–400)
RBC: 3.52 10*6/uL — ABNORMAL LOW (ref 3.80–5.10)
RDW: 13.8 % (ref 11.0–15.0)
WBC: 2.6 10*3/uL — ABNORMAL LOW (ref 3.8–10.8)

## 2018-06-17 LAB — SEDIMENTATION RATE: Sed Rate: 19 mm/h (ref 0–30)

## 2018-06-17 LAB — C-REACTIVE PROTEIN: CRP: 2 mg/L (ref <8.0)

## 2018-06-17 MED ORDER — SERTRALINE HCL 50 MG PO TABS: 50.0000 mg | ORAL_TABLET | Freq: Every day | ORAL | 5 refills | Status: DC

## 2018-06-17 NOTE — Progress Notes (Signed)
This service is provided via telemedicine  No vital signs collected/recorded due to the encounter was a telemedicine visit.   Location of patient (ex: home, work): Home   Patient consents to a telephone visit:  Yes  Location of the provider (ex: office, home):  [facility], Office   Name of any referring provider: N/A  Names of all persons participating in the telemedicine service and their role in the encounter:  S.Chrae B/CMA, Sherrie Mustache, NP, Geraldo Pitter (daughter), and Patient   Time spent on call: 19 min with medical assistant       Careteam: Patient Care Team: Lauree Chandler, NP as PCP - General (Geriatric Medicine) Sueanne Margarita, MD as PCP - Cardiology (Cardiology)  Advanced Directive information    Allergies  Allergen Reactions  . Ace Inhibitors Cough  . Lipitor [Atorvastatin] Swelling  . Simvastatin Other (See Comments)    Myalgias     . Latex Itching    Chief Complaint  Patient presents with  . Medical Management of Chronic Issues    1 month follow-up. Telephone visit      HPI: Patient is a 78 y.o. female for follow up.   Depression- reports mood has not been doing good. Unsure if she has been taking this medication. [facility]  Recently zoloft had been increased from 25 to 50 mg daily.  She is also taking wellbutrin 200 mg daily Would like to see a therapist but would like someone to arrange for this.  Feels like she was seeing a psychiatrist prior to problems with her leg.   No pain in leg continues to have arthritis in left knee. Using muscle rubs for knee. Walking around facility once every other day. About 8 mins.   Has not taken blood pressure at home. Has a blood pressure machine but needs new batteries.   Recently saw orthopedic and now plan is to go to vascular for ongoing evaluation of swelling to left left. At this time swelling has been consistent. Measuring her calf and it is staying about the  same (not getting smaller). Has seen ID and ortho regarding this.   Advanced directed- completed paperwork and needs to be notarized.  She has an appt today at 1 to have this done.   Review of Systems:  Review of Systems  Constitutional: Negative for chills and fever.  Respiratory: Negative for cough and shortness of breath.   Cardiovascular: Positive for leg swelling. Negative for chest pain.  Gastrointestinal: Negative for abdominal pain, diarrhea and vomiting.  Musculoskeletal: Positive for joint pain (left knee).  Neurological: Negative for dizziness and headaches.  Psychiatric/Behavioral: Positive for depression and memory loss. The patient is nervous/anxious.     Past Medical History:  Diagnosis Date  . Anxiety   . Arthritis    "legs, back" (07/29/2012)  . Cellulitis and abscess of leg 12/29/2017  . Chronic diastolic (congestive) heart failure ([place])   . Chronic lower back pain   . Complication of anesthesia    "I have apnea" (07/29/2012)  . Dementia ([place]) 03/12/2018  . 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 Family history of anesthesia complication    "some wake up during OR; some are hard to wake up; some both" (07/29/2012)  . GERD (gastroesophageal reflux disease)   . 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 ([place])   . History of CVA (cerebrovascular accident) 02/01/2015  . History of stomach ulcers 1980's  . Hyperlipidemia   .  Hyperparathyroidism, primary ([place]) 04/22/2012  . Hypertensive heart disease   . Incontinent of urine    wears pads  . Major depressive disorder, recurrent episode, severe ([place]) 11/21/2015  . OSA on CPAP   . Osteoarthritis of right knee   . Pedal edema   . Persistent atrial fibrillation    CHADS2VASC score is 7 - on chronic anticoagulation with Apixaban  . Spinal stenosis   . Umbilical hernia    "unrepaired" (07/29/2012)  . Varicose veins    "BLE" (07/29/2012)  . Vascular dementia ([place])   . Venous stasis  dermatitis of left lower extremity 04/29/2018   Past Surgical History:  Procedure Laterality Date  . CATARACT EXTRACTION    . CHOLECYSTECTOMY  ~ 2010  . COLONOSCOPY    . IUD REMOVAL  1980's  . PARATHYROIDECTOMY N/A 06/16/2012   Procedure: NECK EXPLORATION AND LEFT SUPERIOR PARATHYROIDECTOMY;  Surgeon: Earnstine Regal, MD;  [facility]: [facility];  Service: General;  Laterality: N/A;  . 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 TOTAL KNEE ARTHROPLASTY Right 07/29/2012   Procedure: TOTAL KNEE ARTHROPLASTY;  Surgeon: Ninetta Lights, MD;  Location: [facility];  Service: Orthopedics;  Laterality: Right;   Social History:   reports that she quit smoking about 40 years ago. Her smoking use included cigarettes. She has a 30.00 pack-year smoking history. She has never used smokeless tobacco. She reports that she does not drink alcohol or use drugs.  Family History  Problem Relation Age of Onset  . Hypertension Mother        deceased  . Stroke Mother   . Breast cancer Mother   . Lung cancer Father        deceased  . Diabetes Daughter   . Hypertension Daughter     Medications: Patient's Medications  New Prescriptions   No medications on file  Previous Medications   ACETAMINOPHEN (TYLENOL) 500 MG TABLET    Take 2 tablets (1,000 mg total) by mouth as needed.   APIXABAN (ELIQUIS) 5 MG TABS TABLET    Take 1 tablet (5 mg total) by mouth every 12 (twelve) hours.   BUPROPION (WELLBUTRIN SR) 200 MG 12 HR TABLET    Take 1 tablet (200 mg total) by mouth daily.   CALCIUM CARBONATE-VITAMIN D 600-400 MG-UNIT TABLET    Take 1 tablet by mouth daily.   CARVEDILOL (COREG) 12.5 MG TABLET    Take 1 tablet (12.5 mg total) by mouth 2 (two) times daily with a meal.   CHOLECALCIFEROL (VITAMIN D) 25 MCG (1000 UT) TABLET    Take 1 tablet (1,000 Units total) by mouth daily.   DICLOFENAC SODIUM (VOLTAREN) 1 % GEL    Apply 2 g topically 4 (four) times daily.   DOCUSATE SODIUM (COLACE) 100 MG CAPSULE    Take 1 capsule (100 mg  total) by mouth 2 (two) times daily.   DONEPEZIL (ARICEPT) 10 MG TABLET    Take 1 tablet (10 mg total) by mouth at bedtime. For memory loss   FUROSEMIDE (LASIX) 20 MG TABLET    TAKE 1 TABLET(20 MG) BY MOUTH DAILY   GABAPENTIN (NEURONTIN) 300 MG CAPSULE    Take 2 capsules (600 mg total) by mouth 2 (two) times daily.   HYDRALAZINE (APRESOLINE) 25 MG TABLET    Take 1 tablet (25 mg total) by mouth 3 (three) times daily.   IRON POLYSACCHARIDES (IFEREX 150) 150 MG CAPSULE    Take 1 capsule (150 mg total) by mouth daily.  LOSARTAN (COZAAR) 50 MG TABLET    Take 1 tablet (50 mg total) by mouth daily.   MENTHOL, TOPICAL ANALGESIC, (BIOFREEZE) 4 % GEL    Apply 3 oz topically 3 (three) times daily. For knee pain   MULTIPLE VITAMIN (MULTIVITAMIN WITH MINERALS) TABS TABLET    Take 1 tablet by mouth daily.   PANTOPRAZOLE (PROTONIX) 40 MG TABLET    Take 1 tablet (40 mg total) by mouth daily.   PRAVASTATIN (PRAVACHOL) 40 MG TABLET    Take 1 tablet (40 mg total) by mouth daily.   SERTRALINE (ZOLOFT) 25 MG TABLET    Take 1 tablet (25 mg total) by mouth 2 (two) times daily for 30 days.   VITAMIN B-12 (CYANOCOBALAMIN) 1000 MCG TABLET    Take 2 tablets (2,000 mcg total) by mouth daily.  Modified Medications   No medications on file  Discontinued Medications   CEPHALEXIN (KEFLEX) 500 MG CAPSULE    Take 2 capsules (1,000 mg total) by mouth 2 (two) times daily.    Physical Exam:  There were no vitals filed for this visit. There is no height or weight on file to calculate BMI. Wt Readings from Last 3 Encounters:  06/16/18 189 lb (85.7 kg)  03/09/18 189 lb (85.7 kg)  03/02/18 179 lb (81.2 kg)     Labs reviewed: Basic Metabolic Panel: Recent Labs    09/05/17 0803  02/06/18 1429  03/02/18 1353 03/09/18 1001 03/12/18 1503  NA 142   < > 141   < > 143 142 143  K 4.2   < > 4.2   < > 4.3 3.9 4.1  CL 109   < > 106   < > 109 106 109  CO2 26   < > 27   < > 27 27 27   GLUCOSE 87   < > 123   < > 79 89 87  BUN  24   < > 21   < > 25 24 26*  CREATININE 1.46*   < > 1.43*   < > 1.30* 1.41* 1.48*  CALCIUM 10.3   < > 10.7*   < > 10.2 10.6* 10.1  TSH 1.95  --  1.61  --   --   --   --    < > = values in this interval not displayed.   Liver Function Tests: Recent Labs    12/21/17 1527 12/29/17 1025 02/06/18 1429 02/14/18 0242  AST 48* 38 27 23  ALT 29 33 15 14  ALKPHOS 50 76  --  58  BILITOT 0.6 1.2 0.4 0.4  PROT 6.9 6.0* 6.8 6.0*  ALBUMIN 3.6 2.2*  --  3.0*   No results for input(s): LIPASE, AMYLASE in the last 8760 hours. No results for input(s): AMMONIA in the last 8760 hours. CBC: Recent Labs    03/02/18 1353 03/09/18 1001 03/12/18 1503 06/16/18 1022  WBC 3.5* 3.7* 3.2* 2.6*  NEUTROABS 1,939 2,202 1,696  --   HGB 9.9* 10.1* 9.7* 10.5*  HCT 29.6* 30.3* 29.7* 32.8*  MCV 88.4 88.3 89.2 93.2  PLT 319 273 253 227   Lipid Panel: Recent Labs    09/05/17 0803 02/13/18 0423  CHOL 199 179  HDL 65 63  LDLCALC 117* 103*  TRIG 73 65  CHOLHDL 3.1 2.8   TSH: Recent Labs    09/05/17 0803 02/06/18 1429  TSH 1.95 1.61   A1C: Lab Results  Component Value Date   HGBA1C 5.9 (H) 02/13/2018  Assessment/Plan 1. Depression, unspecified depression type -not controlled at this time. Pt reports she used to see a psychiatrist for this and is interested in following with one again. Also interested in counseling but was unaware she needed to set this up. Names of psychiatrist and psychologist will be mailed.  - sertraline (ZOLOFT) 50 MG tablet; Take 1 tablet (50 mg total) by mouth daily.  Dispense: 30 tablet; Refill: 5  2. Unilateral primary osteoarthritis, left knee Ongoing; following with ortho. Ideally would like to have replacement however on hold due to swelling using Voltaren gel  And biofreeze PRN  3. Localized edema Ongoing swelling to left lower leg. Family measuring leg and swelling has remained stable. Has follow up with vascular scheduled.   4. Essential hypertension -  encouraged to check blood pressure at home and record readings for review. Goal BP <140/90.    Next appt: 3 month follow up, sooner if needed Areal Cochrane K. Jordana Dugue, [facility] [phone]     Virtual Visit via Telephone Note  I connected with pt and her daughter on 06/17/18 at 10:30 AM EDT by telephone and verified that I am speaking with the correct person using two identifiers.  Location: Patient: home Provider: office   I discussed the limitations, risks, security and privacy concerns of performing an evaluation and management service by telephone and the availability of in person appointments. I also discussed with the patient that there may be a patient responsible charge related to this service. The patient expressed understanding and agreed to proceed.   I discussed the assessment and treatment plan with the patient. The patient was provided an opportunity to ask questions and all were answered. The patient agreed with the plan and demonstrated an understanding of the instructions.   The patient was advised to call back or seek an in-person evaluation if the symptoms worsen or if the condition fails to improve as anticipated.  I provided 25 minutes of non-face-to-face time during this encounter.  Carlos American. Harle Battiest Avs printed and mailed

## 2018-06-17 NOTE — Telephone Encounter (Signed)
Per Janett Billow ok to approve medication although not on current medication list

## 2018-06-18 ENCOUNTER — Telehealth: Payer: Self-pay | Admitting: Nurse Practitioner

## 2018-06-18 NOTE — Telephone Encounter (Signed)
[name] called back and scheduled an appt. For 09/18/18

## 2018-06-19 DIAGNOSIS — G4733 Obstructive sleep apnea (adult) (pediatric): Secondary | ICD-10-CM | POA: Diagnosis not present

## 2018-06-29 ENCOUNTER — Ambulatory Visit (INDEPENDENT_AMBULATORY_CARE_PROVIDER_SITE_OTHER): Payer: PPO | Admitting: Infectious Disease

## 2018-06-29 ENCOUNTER — Other Ambulatory Visit: Payer: Self-pay

## 2018-06-29 DIAGNOSIS — N183 Chronic kidney disease, stage 3 unspecified: Secondary | ICD-10-CM

## 2018-06-29 DIAGNOSIS — L02419 Cutaneous abscess of limb, unspecified: Secondary | ICD-10-CM

## 2018-06-29 DIAGNOSIS — I872 Venous insufficiency (chronic) (peripheral): Secondary | ICD-10-CM | POA: Diagnosis not present

## 2018-06-29 DIAGNOSIS — I5032 Chronic diastolic (congestive) heart failure: Secondary | ICD-10-CM

## 2018-06-29 DIAGNOSIS — L03119 Cellulitis of unspecified part of limb: Secondary | ICD-10-CM | POA: Diagnosis not present

## 2018-06-29 DIAGNOSIS — M25473 Effusion, unspecified ankle: Secondary | ICD-10-CM | POA: Diagnosis not present

## 2018-06-29 DIAGNOSIS — I420 Dilated cardiomyopathy: Secondary | ICD-10-CM

## 2018-06-29 NOTE — Progress Notes (Signed)
Virtual Visit via Telephone Note  I connected with Susan [name] on 06/29/18 at 10:45 AM EDT by telephone and verified that I am speaking with the correct person using two identifiers.  Location: Patient: Home Provider: [facility]   I discussed the limitations, risks, security and privacy concerns of performing an evaluation and management service by telephone and the availability of in person appointments. I also discussed with the patient that there may be a patient responsible charge related to this service. The patient expressed understanding and agreed to proceed.   History of Present Illness:  78 year old African-American lady with multiple medical problems, including right-sided heart failure obesity asymmetric edema with chronic venous stasis changes and history of recurrent cellulitis.  I have had several telephonic visits with her over the last month.  There was concern about swelling in her leg getting worse and whether or not this was related to infection or even to antibiotics.  She is since had improvement in her edema after being on higher dose diuretic for 5 days.  Her daughter indicates that she lost at least 1 inch in circumference of her leg that they have been measuring as asked them to.  We had a lengthy discussion about whether or not to continue the antibiotics.  I told her and her daughters that at present there certainly not any deep infection that we are treating with antibiotics.  Rather the purpose of the antibiotics is is to prevent her from having episodes of recurrent cellulitis.  For now after much discussion we went to continue going forward with the Keflex but at a dose of 2 tablets taken in the morning and 2 tablets at nighttime.  Since then her rx ran out and she has not been on antibiotics and not had worsening of her edema or anything to suggest active infection  She is going to see [facility] re her asymmetric edema   Observations/Objective:  Chronic lower  extremity edema left greater than right  Venous stasis is changes and recurrent cellulitis   Arthritis with need for total knee arthroplasty on the left  Assessment and Plan:  Chronic lower extremity edema: Continue to manage this with optimization of diuretics, good nutrition.  Recurrent cellulitis: I want her to have bottle of keflex on hand so that IF she has onset of redness and or fever, malaise, systemic symptoms of infection she can call me review and start keflex at home rather than have to wait for an MD appt if she is having a recurrent episode.  Osteoarthritis: I would not be in a hurry to have the total knee replacement done yet but counseled them to consider having it done in August when this pandemic may be on the down turn and it might be safer for her I think rushing into that kind of procedure at the age of 78 in the middle of this pandemic would not be prudent  Follow Up Instructions:    I discussed the assessment and treatment plan with the patient. The patient was provided an opportunity to ask questions and all were answered. The patient agreed with the plan and demonstrated an understanding of the instructions.   The patient was advised to call back or seek an in-person evaluation if the symptoms worsen or if the condition fails to improve as anticipated.  I provided 15  minutes of non-face-to-face time during this encounter.   Alcide Evener, MD

## 2018-07-06 ENCOUNTER — Other Ambulatory Visit: Payer: Self-pay

## 2018-07-06 DIAGNOSIS — R609 Edema, unspecified: Secondary | ICD-10-CM

## 2018-07-09 ENCOUNTER — Other Ambulatory Visit: Payer: Self-pay | Admitting: Nurse Practitioner

## 2018-07-09 ENCOUNTER — Other Ambulatory Visit: Payer: Self-pay

## 2018-07-09 ENCOUNTER — Encounter: Payer: Self-pay | Admitting: Vascular Surgery

## 2018-07-09 ENCOUNTER — Ambulatory Visit (INDEPENDENT_AMBULATORY_CARE_PROVIDER_SITE_OTHER): Payer: PPO | Admitting: Vascular Surgery

## 2018-07-09 ENCOUNTER — Ambulatory Visit (HOSPITAL_COMMUNITY)
Admission: RE | Admit: 2018-07-09 | Discharge: 2018-07-09 | Disposition: A | Discharge status: Home / Self Care | Payer: PPO | Source: Ambulatory Visit | Attending: Vascular Surgery | Admitting: Vascular Surgery

## 2018-07-09 VITALS — BP 130/84 | HR 53 | Temp 98.4°F | Resp 18 | Ht 65.0 in | Wt 189.0 lb

## 2018-07-09 DIAGNOSIS — R609 Edema, unspecified: Secondary | ICD-10-CM | POA: Diagnosis not present

## 2018-07-09 DIAGNOSIS — I872 Venous insufficiency (chronic) (peripheral): Secondary | ICD-10-CM

## 2018-07-09 NOTE — Progress Notes (Signed)
REASON FOR CONSULT:    Chronic venous insufficiency.  The consult is requested by Dr. Erlinda Hong.  ASSESSMENT & PLAN:   CHRONIC VENOUS INSUFFICIENCY: This patient has CEAP C4 venous disease.  She has deep venous reflux and also significant superficial venous reflux in the great saphenous vein.  We have discussed the importance of intermittent leg elevation and the proper positioning for this.  I have explained her that if she does not elevate her legs correctly it is not effective.  I have written her prescription for thigh-high compression stockings with a gradient of 20 to 30 mmHg.  I encouraged her to avoid prolonged sitting and standing.  We discussed water aerobics as potential form of exercise which would be helpful both with respect to her arthritis in the knee which may limit her activity and also I think is very helpful for patients with venous disease.  We also discussed the importance of weight management as central obesity especially increases lower extremity venous pressure.  I will see her back in 3 months  If she continues to have significant issues with her venous hypertension I think she would be a good candidate for laser ablation of the left great saphenous vein.  PERIPHERAL VASCULAR DISEASE: Based on her exam she does have evidence of some infrainguinal arterial occlusive disease bilaterally.  She had a arterial Doppler study in February of this year and ABIs were normal however her legs were likely falsely elevated because of calcific disease.  She had abnormal posterior tibial signals bilaterally.  Given that she is being considered for knee replacement I would like to repeat her Doppler studies when she returns in October and specifically would be interested in knowing her toe pressures to be sure she has adequate circulation if she is being considered for knee replacement.   Deitra Mayo, MD, FACS [facility] [phone] Office: [phone]   HPI:   Susan [name] is a pleasant 78  y.o. female, referred for evaluation of chronic venous insufficiency. I have reviewed the records from the orthopedic office.  The patient was seen on 06/16/2018.  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  The patient was also noted to have chronic venous stasis and pitting edema.  She is also had cellulitis in the past.  She was being considered for a knee replacement but given the swelling and edema she sent for vascular consultation prior to considering surgery.  The patient was evaluated by Dr. Erlinda Hong.   Patient was subsequently seen by infectious disease on 06/29/2018.  She has had recurrent episodes of cellulitis and has been on antibiotics intermittently.  On my history, the patient denies any history of DVT.  She states that she began having leg swelling this summer and that before that she really did not notice significant leg swelling.  There was some concern that she may have been bitten by something during the summer as she developed cellulitis in the left leg also.  She has been treated intermittently with Keflex.  She does describe some aching pain and heaviness in both legs which is aggravated by standing and sitting and relieved somewhat with elevation.  She does have some knee-high compression stockings and has some difficulty getting these on.  She does take ibuprofen as needed for pain.  She denies any history of claudication although I think her activity is limited by her arthritis of the left knee.  She denies any history of rest pain or nonhealing ulcers.  Her risk  factors for peripheral vascular disease include hypertension, hypercholesterolemia, and a remote history of tobacco use.  She quit in 1980.  She denies any history of diabetes or family history of premature cardiovascular disease.  She is on Eliquis for atrial fibrillation.  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 (HCC)    History of CVA (cerebrovascular accident) 02/01/2015   History of stomach ulcers 1980's   Hyperlipidemia    Hyperparathyroidism, primary ([place]) 04/22/2012   Hypertensive heart disease    Incontinent of urine    wears pads   Major depressive disorder, recurrent episode, 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   OSA on CPAP    Osteoarthritis of right knee    Pedal edema    Persistent atrial fibrillation    CHADS2VASC score is 7 - on chronic anticoagulation with Apixaban   Spinal stenosis    Umbilical hernia    "unrepaired" (07/29/2012)   Varicose veins    "BLE" (07/29/2012)   Vascular dementia ([place])    Venous stasis dermatitis of left lower extremity 04/29/2018    Family History  Problem Relation Age of Onset   Hypertension Mother        deceased   Stroke Mother    Breast cancer Mother    Lung cancer Father        deceased   Diabetes Daughter    Hypertension Daughter     SOCIAL HISTORY: Social History   Socioeconomic History   Marital status: Widowed    Spouse name: Not on file   Number of children: Not on file   Years of education: Not on file   Highest education level: Not on file  Occupational History   Occupation: retired    Fish farm manager: RETIRED  Scientist, product/process development strain: Not hard at all   Food insecurity    Worry: Never true    Inability: Never true   Transportation needs    Medical: No    Non-medical: No  Tobacco Use   Smoking status: Former Smoker    Packs/day:  1.00    Years: 30.00    Pack years: 30.00    Types: Cigarettes    Quit date: 01/07/1978    Years since quitting: 40.5   Smokeless tobacco: Never Used  Substance and Sexual Activity   Alcohol use: No   Drug use: No   Sexual activity: Not Currently    Comment: intercourse age 64,less than 5 secxual partners,  Lifestyle   Physical activity    Days per week: 3 days    Minutes per session: 30 min   Stress: Rather much  Relationships   Social connections    Talks on phone: More than three times a week    Gets together: More than three times a week    Attends religious service: More than 4 times per year    Active member of club or  organization: No    Attends meetings of clubs or organizations: Never    Relationship status: Widowed   Intimate partner violence    Fear of current or ex partner: No    Emotionally abused: No    Physically abused: No    Forced sexual activity: No  Other Topics Concern   Not on file  Social History Narrative   Lives at home alone   Right-handed   Caffeine: 1 cup of coffee per day         Diet:      Caffeine:      Married, if yes what year:       Do you live in a house, apartment, assisted living, condo, trailer, ect:      Pets:      Current/Past profession:      Exercise:         Living Will: Yes   DNR: No   POA/HPOA: Yes      Functional Status:   Do you have difficulty bathing or dressing yourself? Yes   Do you have difficulty preparing food or eating? No   Do you have difficulty managing your medications? Yes   Do you have difficulty managing your finances? Yes   Do you have difficulty affording your medications? Yes    Allergies  Allergen Reactions   Ace Inhibitors Cough   Lipitor [Atorvastatin] Swelling   Simvastatin Other (See Comments)    Myalgias      Latex Itching    Current Outpatient Medications  Medication Sig Dispense Refill   acetaminophen (TYLENOL) 500 MG tablet Take 2 tablets (1,000 mg total)  by mouth as needed. 30 tablet 2   apixaban (ELIQUIS) 5 MG TABS tablet Take 1 tablet (5 mg total) by mouth every 12 (twelve) hours. 60 tablet 4   buPROPion (WELLBUTRIN SR) 200 MG 12 hr tablet TAKE 1 TABLET BY MOUTH EVERY DAY 30 tablet 5   Calcium Carbonate-Vitamin D 600-400 MG-UNIT tablet TAKE 1 TABLET BY MOUTH EVERY DAY 30 tablet 2   carvedilol (COREG) 12.5 MG tablet TAKE 1 TABLET BY MOUTH 2 TIMES DAILY WITH MEALS 60 tablet 5   cholecalciferol (VITAMIN D) 25 MCG (1000 UT) tablet TAKE 1 TABLET BY MOUTH EVERY DAY 30 tablet 2   diclofenac sodium (VOLTAREN) 1 % GEL Apply 2 g topically 4 (four) times daily. 1 Tube 5   DOK 100 MG capsule TAKE 1 CAPSULE BY MOUTH 2 TIMES DAILY 60 capsule 5   donepezil (ARICEPT) 10 MG tablet Take 1 tablet (10 mg total) by mouth at bedtime. For memory loss 90 tablet 1   FLORASTOR 250 MG capsule TAKE 1 CAPSULE BY MOUTH 2 TIMES DAILY 60 capsule 5   furosemide (LASIX) 20 MG tablet TAKE 1 TABLET(20 MG) BY MOUTH DAILY 90 tablet 0   gabapentin (NEURONTIN) 300 MG capsule Take 2 capsules (600 mg total) by mouth 2 (two) times daily. 60 capsule 1   hydrALAZINE (APRESOLINE) 25 MG tablet TAKE 1 TABLET BY MOUTH 3 TIMES DAILY 90 tablet 5   losartan (COZAAR) 50 MG tablet Take 1 tablet (50 mg total) by mouth daily. 30 tablet 3   Menthol, Topical Analgesic, (BIOFREEZE) 4 % GEL Apply 3 oz topically 3 (three) times daily. For knee pain 1 Tube 3   Multiple Vitamin (GNP ESSENTIAL ONE DAILY) TABS TAKE 1 TABLET BY MOUTH EVERY DAY 30 tablet 11   pantoprazole (PROTONIX) 40 MG tablet Take 1 tablet (40 mg total) by mouth daily.  90 tablet 0   POLY-IRON 150 150 MG capsule TAKE 1 CAPSULE BY MOUTH EVERY DAY 30 capsule 5   pravastatin (PRAVACHOL) 40 MG tablet TAKE 1 TABLET BY MOUTH EVERY DAY 30 tablet 5   sertraline (ZOLOFT) 50 MG tablet Take 1 tablet (50 mg total) by mouth daily. 30 tablet 5   vitamin B-12 (CYANOCOBALAMIN) 1000 MCG tablet TAKE 2 TABLETS BY MOUTH EVERY DAY 60 tablet 5    No current facility-administered medications for this visit.     REVIEW OF SYSTEMS:  [X]  denotes positive finding, [ ]  denotes negative finding Cardiac  Comments:  Chest pain or chest pressure:    Shortness of breath upon exertion: x   Short of breath when lying flat:    Irregular heart rhythm: x       Vascular    Pain in calf, thigh, or hip brought on by ambulation:    Pain in feet at night that wakes you up from your sleep:     Blood clot in your veins:    Leg swelling:  x       Pulmonary    Oxygen at home:    Productive cough:     Wheezing:         Neurologic    Sudden weakness in arms or legs:     Sudden numbness in arms or legs:     Sudden onset of difficulty speaking or slurred speech:    Temporary loss of vision in one eye:     Problems with dizziness:         Gastrointestinal    Blood in stool:     Vomited blood:         Genitourinary    Burning when urinating:     Blood in urine:        Psychiatric    Major depression:         Hematologic    Bleeding problems:    Problems with blood clotting too easily:        Skin    Rashes or ulcers:        Constitutional    Fever or chills:     PHYSICAL EXAM:   Vitals:   07/09/18 1422  BP: 130/84  Pulse: (!) 53  Resp: 18  Temp: 98.4 F (36.9 C)  TempSrc: Other (Comment)  SpO2: 98%  Weight: 189 lb (85.7 kg)  Height: 5\' 5"  (1.651 m)   Body mass index is 31.45 kg/m.  GENERAL: The patient is a well-nourished female, in no acute distress. The vital signs are documented above. CARDIAC: There is a regular rate and rhythm.  VASCULAR: I do not detect carotid bruits. On the right side she has a palpable dorsalis pedis pulse.  I cannot palpate a posterior tibial pulse.  She has moderate swelling of the right foot.  She does have a monophasic posterior tibial signal with the Doppler and a brisk dorsalis pedis signal with the Doppler. On the left side she she has a palpable dorsalis pedis pulse.  I cannot  palpate a posterior tibial pulse because of the foot swelling.  She does have a monophasic posterior tibial signal with the Doppler.  She has a brisk dorsalis pedis signal with the Doppler. She has significant hyperpigmentation bilaterally consistent with chronic venous insufficiency.  This is more significant on the left side.     PULMONARY: There is good air exchange bilaterally without wheezing or rales. ABDOMEN: Soft and non-tender with normal  pitched bowel sounds.  MUSCULOSKELETAL: There are no major deformities or cyanosis. NEUROLOGIC: No focal weakness or paresthesias are detected. SKIN: There are no ulcers or rashes noted. PSYCHIATRIC: The patient has a normal affect.  DATA:    VENOUS DUPLEX: I have independently interpreted her venous duplex scan.  On the left side, which is the side of concern, there is no evidence of DVT.  She does have deep venous reflux involving the common femoral vein.  There is superficial venous reflux involving the great saphenous vein throughout the entire thigh and the vein is significantly dilated.  There is also some reflux in the short saphenous vein however this vein is not dilated.  On the right side he has deep venous reflux and also superficial venous reflux involving the great saphenous vein and small saphenous vein.  There is no evidence of DVT on the right.  ARTERIAL DOPPLER STUDY: The patient did have an arterial Doppler study in February of this year which showed monophasic Doppler signals in the right foot with an ABI of 77%.  On the left side which is the side of concern there was a biphasic anterior tibial signal with a monophasic posterior tibial signal.  ABI was 100% although this was likely falsely elevated because of calcific disease.

## 2018-07-17 ENCOUNTER — Other Ambulatory Visit: Payer: Self-pay

## 2018-07-17 ENCOUNTER — Telehealth: Payer: PPO | Admitting: Cardiology

## 2018-07-17 ENCOUNTER — Telehealth: Payer: Self-pay | Admitting: Cardiology

## 2018-07-17 ENCOUNTER — Telehealth: Payer: Self-pay | Admitting: *Deleted

## 2018-07-17 ENCOUNTER — Encounter: Payer: Self-pay | Admitting: Cardiology

## 2018-07-17 NOTE — Telephone Encounter (Signed)
Follow up ° ° ° ° ° ° ° °Virtual Visit Pre-Appointment Phone Call ° °"(Name), I am calling you today to discuss your upcoming appointment. We are currently trying to limit exposure to the virus that causes COVID-19 by seeing patients at home rather than in the office." ° ° ° °1. Confirm consent - "In the setting of the current Covid19 crisis, you are scheduled for a (phone or video) visit with your provider on (date) at (time).  Just as we do with many in-office visits, in order for you to participate in this visit, we must obtain consent.  If you'd like, I can send this to your mychart (if signed up) or email for you to review.  Otherwise, I can obtain your verbal consent now.  All virtual visits are billed to your insurance company just like a normal visit would be.  By agreeing to a virtual visit, we'd like you to understand that the technology does not allow for your provider to perform an examination, and thus may limit your provider's ability to fully assess your condition. If your provider identifies any concerns that need to be evaluated in person, we will make arrangements to do so.  Finally, though the technology is pretty good, we cannot assure that it will always work on either your or our end, and in the setting of a video visit, we may have to convert it to a phone-only visit.  In either situation, we cannot ensure that we have a secure connection.  Are you willing to proceed?" STAFF: Did the patient verbally acknowledge consent to telehealth visit? Document YES/NO here: YES ° ° ° ° ° ° °FULL LENGTH CONSENT FOR TELE-HEALTH VISIT  ° °I hereby voluntarily request, consent and authorize [facility] and its employed or contracted physicians, physician assistants, nurse practitioners or other licensed health care professionals (the Practitioner), to provide me with telemedicine health care services (the “Services") as deemed necessary by the treating Practitioner. I acknowledge and consent to receive the  Services by the Practitioner via telemedicine. I understand that the telemedicine visit will involve communicating with the Practitioner through live audiovisual communication technology and the disclosure of certain medical information by electronic transmission. I acknowledge that I have been given the opportunity to request an in-person assessment or other available alternative prior to the telemedicine visit and am voluntarily participating in the telemedicine visit. ° °I understand that I have the right to withhold or withdraw my consent to the use of telemedicine in the course of my care at any time, without affecting my right to future care or treatment, and that the Practitioner or I may terminate the telemedicine visit at any time. I understand that I have the right to inspect all information obtained and/or recorded in the course of the telemedicine visit and may receive copies of available information for a reasonable fee.  I understand that some of the potential risks of receiving the Services via telemedicine include:  °• Delay or interruption in medical evaluation due to technological equipment failure or disruption; °• Information transmitted may not be sufficient (e.g. poor resolution of images) to allow for appropriate medical decision making by the Practitioner; and/or  °• In rare instances, security protocols could fail, causing a breach of personal health information. ° °Furthermore, I acknowledge that it is my responsibility to provide information about my medical history, conditions and care that is complete and accurate to the best of my ability. I acknowledge that Practitioner's advice, recommendations, and/or decision may   be based on factors not within their control, such as incomplete or inaccurate data provided by me or distortions of diagnostic images or specimens that may result from electronic transmissions. I understand that the practice of medicine is not an exact science and that  Practitioner makes no warranties or guarantees regarding treatment outcomes. I acknowledge that I will receive a copy of this consent concurrently upon execution via email to the email address I last provided but may also request a printed copy by calling the office of [facility].   ° °I understand that my insurance will be billed for this visit.  ° °I have read or had this consent read to me. °• I understand the contents of this consent, which adequately explains the benefits and risks of the Services being provided via telemedicine.  °• I have been provided ample opportunity to ask questions regarding this consent and the Services and have had my questions answered to my satisfaction. °• I give my informed consent for the services to be provided through the use of telemedicine in my medical care ° °By participating in this telemedicine visit I agree to the above. ° °

## 2018-07-17 NOTE — Telephone Encounter (Signed)
New Message ° ° ° °Left message to confirm appt and get consent  °

## 2018-07-17 NOTE — Telephone Encounter (Signed)
Attempted to call patient back after phone hung up twice . Lvm for patient to call back to finish appointment or reschedule appointment when signal better.

## 2018-07-20 NOTE — Progress Notes (Signed)
Entered in error

## 2018-07-27 NOTE — Progress Notes (Signed)
Virtual Visit via Telephone Note   This visit type was conducted due to national recommendations for restrictions regarding the COVID-19 Pandemic (e.g. social distancing) in an effort to limit this patient's exposure and mitigate transmission in our community.  Due to her co-morbid illnesses, this patient is at least at moderate risk for complications without adequate follow up.  This format is felt to be most appropriate for this patient at this time.  The patient did not have access to video technology/had technical difficulties with video requiring transitioning to audio format only (telephone).  All issues noted in this document were discussed and addressed.  No physical exam could be performed with this format.  Please refer to the patient's chart for her  consent to telehealth for [facility].   Date:  07/28/2018   ID:  Susan [name], DOB [birth year], MRN [id number]  Patient Location: [place] Provider Location: Home  PCP:  Lauree Chandler, NP  Cardiologist:  Fransico Him, MD  Electrophysiologist:  None   Evaluation Performed:  Follow-Up Visit  Chief Complaint:   Hands tingling  History of Present Illness:    Susan [name] is a 78 y.o. female with chronic diastolic CHF, hypertension, HLD, OSA on CPAP,CKD stage 3.  Followed by Dr. Scot Dock for PVD, on Eliquis for CVA  Patient last saw Dr. Radford Pax 06/2017 at which time she was doing well.  Was having trouble with CPAP transmission.  Patient tried to have a tele-medicine visit with Ellen Henri, PA-C 07/17/2018 but the connection was too poor.  Patient complains numbness in her fingertips that started 2 weeks ago. Saw Dr. Scot Dock 2 weeks ago and told to wear compression hose but not wearing them because they are too hard to put on.  BP up today but hasn't taken her meds yet. Checks BP about once a week and she can't remember what the readings are. BP 130/84 at [facility] office 07/09/18.Denies  extra salt intake. Has lost 10 lbs on her scales.   The patient does not have symptoms concerning for COVID-19 infection (fever, chills, cough, or new shortness of breath).    Past Medical History:  Diagnosis Date  . Anxiety   . Arthritis    "legs, back" (07/29/2012)  . Cellulitis and abscess of leg 12/29/2017  . Chronic diastolic (congestive) heart failure ([place])   . Chronic lower back pain   . Complication of anesthesia    "I have apnea" (07/29/2012)  . Dementia ([place]) 03/12/2018  . 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 Family history of anesthesia complication    "some wake up during OR; some are hard to wake up; some both" (07/29/2012)  . GERD (gastroesophageal reflux disease)   . 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 ([place])   . History of CVA (cerebrovascular accident) 02/01/2015  . History of stomach ulcers 1980's  . Hyperlipidemia   . Hyperparathyroidism, primary ([place]) 04/22/2012  . Hypertensive heart disease   . Incontinent of urine    wears pads  . Major depressive disorder, recurrent episode, severe ([place]) 11/21/2015  . OSA on CPAP   . Osteoarthritis of right knee   . Pedal edema   . Persistent atrial fibrillation    CHADS2VASC score is 7 - on chronic anticoagulation with Apixaban  . Spinal stenosis   . Umbilical hernia    "unrepaired" (07/29/2012)  . Varicose veins    "BLE" (07/29/2012)  . Vascular dementia ([place])   .  Venous stasis dermatitis of left lower extremity 04/29/2018   Past Surgical History:  Procedure Laterality Date  . CATARACT EXTRACTION    . CHOLECYSTECTOMY  ~ 2010  . COLONOSCOPY    . IUD REMOVAL  1980's  . PARATHYROIDECTOMY N/A 06/16/2012   Procedure: NECK EXPLORATION AND LEFT SUPERIOR PARATHYROIDECTOMY;  Surgeon: Earnstine Regal, MD;  Location: [facility];  Service: General;  Laterality: N/A;  . 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 TOTAL KNEE ARTHROPLASTY Right 07/29/2012   Procedure: TOTAL KNEE ARTHROPLASTY;   Surgeon: Ninetta Lights, MD;  Location: [place];  Service: Orthopedics;  Laterality: Right;     Current Meds  Medication Sig  . acetaminophen (TYLENOL) 500 MG tablet Take 2 tablets (1,000 mg total) by mouth as needed.  Marland Kitchen apixaban (ELIQUIS) 5 MG TABS tablet Take 1 tablet (5 mg total) by mouth every 12 (twelve) hours.  Marland Kitchen buPROPion (WELLBUTRIN SR) 200 MG 12 hr tablet TAKE 1 TABLET BY MOUTH EVERY DAY  . Calcium Carbonate-Vitamin D 600-400 MG-UNIT tablet TAKE 1 TABLET BY MOUTH EVERY DAY  . carvedilol (COREG) 12.5 MG tablet TAKE 1 TABLET BY MOUTH 2 TIMES DAILY WITH MEALS  . cholecalciferol (VITAMIN D) 25 MCG (1000 UT) tablet TAKE 1 TABLET BY MOUTH EVERY DAY  . diclofenac sodium (VOLTAREN) 1 % GEL Apply 2 g topically 4 (four) times daily.  Marland Kitchen DOK 100 MG capsule TAKE 1 CAPSULE BY MOUTH 2 TIMES DAILY  . donepezil (ARICEPT) 10 MG tablet Take 1 tablet (10 mg total) by mouth at bedtime. For memory loss  . FLORASTOR 250 MG capsule TAKE 1 CAPSULE BY MOUTH 2 TIMES DAILY  . furosemide (LASIX) 20 MG tablet TAKE 1 TABLET(20 MG) BY MOUTH DAILY  . hydrALAZINE (APRESOLINE) 25 MG tablet TAKE 1 TABLET BY MOUTH 3 TIMES DAILY  . losartan (COZAAR) 50 MG tablet Take 1 tablet (50 mg total) by mouth daily.  . Menthol, Topical Analgesic, (BIOFREEZE) 4 % GEL Apply 3 oz topically 3 (three) times daily. For knee pain  . Multiple Vitamin (GNP ESSENTIAL ONE DAILY) TABS TAKE 1 TABLET BY MOUTH EVERY DAY  . pantoprazole (PROTONIX) 40 MG tablet Take 1 tablet (40 mg total) by mouth daily.  Marland Kitchen POLY-IRON 150 150 MG capsule TAKE 1 CAPSULE BY MOUTH EVERY DAY  . pravastatin (PRAVACHOL) 40 MG tablet TAKE 1 TABLET BY MOUTH EVERY DAY  . sertraline (ZOLOFT) 50 MG tablet Take 1 tablet (50 mg total) by mouth daily.  . vitamin B-12 (CYANOCOBALAMIN) 1000 MCG tablet TAKE 2 TABLETS BY MOUTH EVERY DAY     Allergies:   Ace inhibitors, Lipitor [atorvastatin], Simvastatin, and Latex   Social History   Tobacco Use  . Smoking status: Former Smoker     Packs/day: 1.00    Years: 30.00    Pack years: 30.00    Types: Cigarettes    Quit date: 01/07/1978    Years since quitting: 40.5  . Smokeless tobacco: Never Used  Substance Use Topics  . Alcohol use: No  . Drug use: No     Family Hx: The patient's family history includes Breast cancer in her mother; Diabetes in her daughter; Hypertension in her daughter and mother; Lung cancer in her father; Stroke in her mother.  ROS:   Please see the history of present illness.      All other systems reviewed and are negative.   Prior CV studies:   The following studies were reviewed today: 2D echo 2017Study Conclusions   -  Left ventricle: The cavity size was normal. There was mild   concentric hypertrophy. Systolic function was normal. The   estimated ejection fraction was in the range of 55% to 60%. Wall   motion was normal; there were no regional wall motion   abnormalities. - Aortic valve: Transvalvular velocity was minimally increased.   There was no stenosis. - Mitral valve: Calcified annulus. There was mild regurgitation. - Left atrium: The atrium was mildly dilated. - Pulmonary arteries: Systolic pressure was mildly increased.   Impressions:   - EF improved when compared to prior (35%)   Lower extremity Dopplers 7/2/2020Summary: Right: No reflux was noted in the popliteal vein. Abnormal reflux times were noted in the common femoral vein, femoral vein in the thigh, great saphenous vein at the saphenofemoral junction, great saphenous vein at the proximal thigh, great saphenous  vein at the mid thigh, great saphenous vein at the distal thigh, great saphenous vein at the knee, origin of the small saphenous vein, and proximal small saphenous vein. Evidence of chronic venous insufficiency is detected in the great saphenous vein,  small saphenous vein, and deep venous system. There is no evidence of deep vein thrombosis in the lower extremity. There is no evidence of superficial venous  thrombosis. No cystic structure found in the popliteal fossa. Left: No reflux was noted in the femoral vein in the thigh, and popliteal vein. Abnormal reflux times were noted in the common femoral vein, great saphenous vein at the saphenofemoral junction, great saphenous vein at the proximal thigh, great saphenous  vein at the mid thigh, great saphenous vein at the distal thigh, great saphenous vein at the knee, great saphenous vein at the proximal calf, prox small saphenous vein, and mid small saphenous vein. Evidence of chronic venous insufficiency is detected in  the great saphenous vein, small saphenous vein, and deep venous system. There is no evidence of deep vein thrombosis in the lower extremity. There is no evidence of superficial venous thrombosis. No cystic structure found in the popliteal fossa.       Labs/Other Tests and Data Reviewed:    EKG:  No ECG reviewed.  Recent Labs: 02/06/2018: TSH 1.61 02/14/2018: ALT 14 03/12/2018: BUN 26; Creat 1.48; Potassium 4.1; Sodium 143 06/16/2018: Hemoglobin 10.5; Platelets 227   Recent Lipid Panel Lab Results  Component Value Date/Time   CHOL 179 02/13/2018 04:23 AM   CHOL 215 (H) 04/06/2015 11:36 AM   TRIG 65 02/13/2018 04:23 AM   HDL 63 02/13/2018 04:23 AM   HDL 63 04/06/2015 11:36 AM   CHOLHDL 2.8 02/13/2018 04:23 AM   LDLCALC 103 (H) 02/13/2018 04:23 AM   LDLCALC 117 (H) 09/05/2017 08:03 AM    Wt Readings from Last 3 Encounters:  07/28/18 165 lb (74.8 kg)  07/09/18 189 lb (85.7 kg)  06/16/18 189 lb (85.7 kg)     Objective:    Vital Signs:  BP (!) 196/89   Pulse (!) 52   Ht 5\' 5"  (1.651 m)   Wt 165 lb (74.8 kg)   BMI 27.46 kg/m    VITAL SIGNS:  reviewed  ASSESSMENT & PLAN:    Chronic diastolic CHF- seems compensated-but difficult to fully evaluate..   Essential hypertension BP high and daughter doesn't know if she's taking her BP correctly. She's watching her salt. She will take her BP daily and have medical guardian to  check as well and call us with results. She is in independent living at the [place] and no one has  been allowed in. Her daughter was on the visit as well. T   OSA not using CPAP-didn't think she needed it but I asked her to start again. She said she will do better.   HLD LDL 103 02/2018  PVD followed by Dr. Scot Dock  CKD stage 3 Crt 1.48 03/2018-needs rechecked on Eliquis- to follow up with her PCP  Anemia with hands tingling-asked her to f/u with PCP.   COVID-19 Education: The signs and symptoms of COVID-19 were discussed with the patient and how to seek care for testing (follow up with PCP or arrange E-visit).   The importance of social distancing was discussed today.  Time:   Today, I have spent  29 minutes with the patient with telehealth technology discussing the above problems.     Medication Adjustments/Labs and Tests Ordered: Current medicines are reviewed at length with the patient today.  Concerns regarding medicines are outlined above.   Tests Ordered: No orders of the defined types were placed in this encounter.   Medication Changes: No orders of the defined types were placed in this encounter.   Follow Up:  Virtual Visit in 1 month(s) Dr. Radford Pax  Signed, Ermalinda Barrios, PA-C  07/28/2018 11:11 AM    [place]

## 2018-07-28 ENCOUNTER — Telehealth (INDEPENDENT_AMBULATORY_CARE_PROVIDER_SITE_OTHER): Payer: PPO | Admitting: Physician Assistant

## 2018-07-28 ENCOUNTER — Other Ambulatory Visit: Payer: Self-pay

## 2018-07-28 ENCOUNTER — Telehealth: Payer: Self-pay

## 2018-07-28 ENCOUNTER — Encounter: Payer: Self-pay | Admitting: Physician Assistant

## 2018-07-28 VITALS — BP 196/89 | HR 52 | Ht 65.0 in | Wt 165.0 lb

## 2018-07-28 DIAGNOSIS — D649 Anemia, unspecified: Secondary | ICD-10-CM | POA: Diagnosis not present

## 2018-07-28 DIAGNOSIS — N183 Chronic kidney disease, stage 3 unspecified: Secondary | ICD-10-CM

## 2018-07-28 DIAGNOSIS — I10 Essential (primary) hypertension: Secondary | ICD-10-CM

## 2018-07-28 DIAGNOSIS — G4733 Obstructive sleep apnea (adult) (pediatric): Secondary | ICD-10-CM

## 2018-07-28 DIAGNOSIS — I5032 Chronic diastolic (congestive) heart failure: Secondary | ICD-10-CM | POA: Diagnosis not present

## 2018-07-28 DIAGNOSIS — E785 Hyperlipidemia, unspecified: Secondary | ICD-10-CM

## 2018-07-28 NOTE — Telephone Encounter (Signed)
I am unsure as to what blood work she is referring to, will need more information regarding this.

## 2018-07-28 NOTE — Patient Instructions (Addendum)
Medication Instructions:  Your physician recommends that you continue on your current medications as directed. Please refer to the Current Medication list given to you today.  If you need a refill on your cardiac medications before your next appointment, please call your pharmacy.   Lab work: None ordered  If you have labs (blood work) drawn today and your tests are completely normal, you will receive your results only by: Marland Kitchen MyChart Message (if you have MyChart) OR . A paper copy in the mail If you have any lab test that is abnormal or we need to change your treatment, we will call you to review the results.  Testing/Procedures: None ordered   Follow-Up: At [facility], you and your health needs are our priority.  As part of our continuing mission to provide you with exceptional heart care, we have created designated Provider Care Teams.  These Care Teams include your primary Cardiologist (physician) and Advanced Practice Providers (APPs -  Physician Assistants and Nurse Practitioners) who all work together to provide you with the care you need, when you need it. You will need a follow up appointment with Dr. Radford Pax, 09/09/2018 at 9:00.  This will be a PHONE VISIT as well.  Any Other Special Instructions Will Be Listed Below (If Applicable).  USE YOUR CPAP MACHINE  MAKE SURE YOU CHECK YOUR BLOOD PRESSURE DAILY

## 2018-07-28 NOTE — Telephone Encounter (Signed)
Patient called and requested that she needed someone to come to her home to draw blood. Looked in chart and did not see any orders. Patient is seen by home health nurse. Requesting orders for home health nurse to be able to draw blood. Routing to provider.

## 2018-07-29 ENCOUNTER — Telehealth: Payer: Self-pay | Admitting: Nurse Practitioner

## 2018-07-29 NOTE — Telephone Encounter (Signed)
Spoke with patient, patient states  1.) Would like kidneys and anemia followed up on.  2.) Patient states she also has tingling in her fingers x 4 week that she would like to have labs for.  3.) Patient is also asking for an order to be sent to home health agency to address balance issues     [place]

## 2018-07-29 NOTE — Telephone Encounter (Signed)
Spoke with patient, appointment scheduled for tomorrow with Dinah (telephone visit per patient's request)

## 2018-07-29 NOTE — Telephone Encounter (Signed)
She will need a follow up visit to discuss this before labs can be order

## 2018-07-29 NOTE — Telephone Encounter (Signed)
Pt called to let Janett Billow know what her bp reading was for 07/28/18  184/92  Thanks, Vilinda Blanks.

## 2018-07-30 ENCOUNTER — Other Ambulatory Visit: Payer: Self-pay

## 2018-07-30 ENCOUNTER — Encounter: Payer: Self-pay | Admitting: Family

## 2018-07-30 ENCOUNTER — Ambulatory Visit (INDEPENDENT_AMBULATORY_CARE_PROVIDER_SITE_OTHER): Payer: PPO | Admitting: Family

## 2018-07-30 DIAGNOSIS — D519 Vitamin B12 deficiency anemia, unspecified: Secondary | ICD-10-CM | POA: Diagnosis not present

## 2018-07-30 DIAGNOSIS — R202 Paresthesia of skin: Secondary | ICD-10-CM | POA: Diagnosis not present

## 2018-07-30 DIAGNOSIS — I130 Hypertensive heart and chronic kidney disease with heart failure and stage 1 through stage 4 chronic kidney disease, or unspecified chronic kidney disease: Secondary | ICD-10-CM | POA: Diagnosis not present

## 2018-07-30 NOTE — Telephone Encounter (Signed)
Patient scheduled for an appointment 07/30/18 with [facility]

## 2018-07-30 NOTE — Progress Notes (Signed)
This service is provided via telemedicine  No vital signs collected/recorded due to the encounter was a telemedicine visit.   Location of patient (ex: home, work):  Home   Patient consents to a telephone visit:  Yes  Location of the provider (ex: office, home):  Office   Name of any referring provider:  Sherrie Mustache, NP   Names of all persons participating in the telemedicine service and their role in the encounter: Marlowe Sax, NP, Ruthell Rummage CMA, Althea Charon and Olivia Mackie daughter   Time spent on call:  Ruthell Rummage CMA, spent 23  Minutes on phone with patient.

## 2018-07-30 NOTE — Progress Notes (Signed)
Provider: Imelda Dandridge FNP-C  Lauree Chandler, NP  Patient Care Team: Lauree Chandler, NP as PCP - General (Geriatric Medicine) Sueanne Margarita, MD as PCP - Cardiology (Cardiology)  Extended Emergency Contact Information Primary Emergency Contact: [place], [place] [address] [place] Phone: [phone] Work Phone: [phone] Mobile Phone: [phone] Relation: Daughter Secondary Emergency Contact: [place], [place] Mobile Phone: [phone] Relation: Daughter  Code Status: Full code  Goals of care: Advanced Directive information Advanced Directives 05/13/2018  Does Patient Have a Medical Advance Directive? Yes  Type of Advance Directive -  Does patient want to make changes to medical advance directive? No - Patient declined  Copy of [place] in Chart? -  Would patient like information on creating a medical advance directive? -  Pre-existing out of facility DNR order (yellow form or pink MOST form) Yellow form placed in chart (order not valid for inpatient use)     Chief Complaint  Patient presents with   Acute Visit    Patient c/o Tingling in Fingers and BP has been very high Tuesday it was 184/92 was taken medical tech  and requesting lab orders home health refer to phone note dated 07/28/18     HPI:  Pt is a 78 y.o. female seen today for an acute visit for evaluation of tingling of her fingers and a high blood pressure reading  184/92 on Tueday taken by Med Tech.she states would like to have her kidney and anemia level checked.she unable to check her blood pressure today but can make arrangement to check it.she denies any fever,chills,cough or symptoms of UTI.Also denies any headache,dizziness,chest pain or fatigue.she takes her blood pressure medication as advised.  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)    Family history of anesthesia complication    "some wake up during OR; some are hard to wake up; some both" (07/29/2012)   GERD (gastroesophageal reflux disease)    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 (HCC)    History of CVA (cerebrovascular accident) 02/01/2015   History of stomach ulcers 1980's   Hyperlipidemia    Hyperparathyroidism, primary ([place]) 04/22/2012   Hypertensive heart disease    Incontinent of urine    wears pads   Major depressive disorder, recurrent episode, 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   OSA on CPAP    Osteoarthritis of right knee    Pedal edema    Persistent atrial fibrillation    CHADS2VASC score is 7 - on chronic anticoagulation with Apixaban   Spinal stenosis    Umbilical hernia    "unrepaired" (07/29/2012)   Varicose veins    "BLE" (07/29/2012)   Vascular dementia ([place])    Venous stasis dermatitis of left lower extremity 04/29/2018   Past Surgical History:  Procedure Laterality Date   CATARACT EXTRACTION     CHOLECYSTECTOMY  ~ 2010   COLONOSCOPY     IUD REMOVAL  1980's   PARATHYROIDECTOMY N/A 06/16/2012   Procedure: NECK EXPLORATION AND LEFT SUPERIOR PARATHYROIDECTOMY;  Surgeon: Earnstine Regal, MD;  Location: [facility];  Service: General;  Laterality: N/A;   TOTAL KNEE ARTHROPLASTY Right 07/29/2012   TOTAL KNEE ARTHROPLASTY Right 07/29/2012   Procedure: TOTAL KNEE ARTHROPLASTY;  Surgeon: Ninetta Lights, MD;  Location: [facility];  Service: Orthopedics;  Laterality: Right;    Allergies  Allergen Reactions   Ace Inhibitors Cough   Lipitor [Atorvastatin] Swelling   Simvastatin Other (See Comments)    Myalgias      Latex Itching    Outpatient Encounter Medications as of 07/30/2018  Medication  Sig   acetaminophen (TYLENOL) 500 MG tablet Take 2 tablets (1,000 mg total) by mouth as needed.   apixaban (ELIQUIS) 5 MG TABS tablet Take 1 tablet (5 mg total) by mouth every 12 (twelve) hours.   Apple Cid Vn-Grn Tea-Bit Or-Cr (APPLE CIDER VINEGAR PLUS PO) Take 1,245 mg by mouth daily.   buPROPion (WELLBUTRIN SR) 200 MG 12 hr tablet TAKE 1 TABLET BY MOUTH EVERY DAY   Calcium Carbonate-Vitamin D 600-400 MG-UNIT tablet TAKE 1 TABLET BY MOUTH EVERY DAY   carvedilol (COREG) 12.5 MG tablet TAKE 1 TABLET BY MOUTH 2 TIMES DAILY WITH MEALS   cholecalciferol (VITAMIN D) 25 MCG (1000 UT) tablet TAKE 1 TABLET BY MOUTH EVERY DAY   diclofenac sodium (VOLTAREN) 1 % GEL Apply 2 g topically 4 (four) times daily.   DOK 100 MG capsule TAKE 1 CAPSULE BY MOUTH 2 TIMES DAILY   donepezil (ARICEPT) 10 MG tablet Take 1 tablet (10 mg total) by mouth at bedtime. For memory loss   FLORASTOR 250 MG capsule TAKE 1 CAPSULE BY MOUTH 2 TIMES DAILY   furosemide (LASIX) 20 MG tablet TAKE 1 TABLET(20 MG) BY MOUTH DAILY   gabapentin (NEURONTIN) 300 MG capsule Take 300 mg by mouth as needed.   hydrALAZINE (APRESOLINE) 25 MG tablet TAKE 1 TABLET BY MOUTH 3 TIMES DAILY   losartan (COZAAR) 50 MG tablet Take 1 tablet (50 mg total) by mouth daily.   Menthol, Topical Analgesic, (BIOFREEZE) 4 % GEL Apply 3 oz topically 3 (three) times daily. For knee pain   Multiple Vitamin (GNP ESSENTIAL ONE DAILY) TABS TAKE 1 TABLET BY MOUTH EVERY DAY   pantoprazole (PROTONIX) 40 MG tablet Take 1 tablet (40 mg total) by mouth daily.   POLY-IRON 150 150 MG capsule TAKE 1 CAPSULE BY MOUTH EVERY DAY   pravastatin (PRAVACHOL) 40 MG tablet TAKE 1 TABLET BY MOUTH EVERY DAY   sertraline (ZOLOFT) 50 MG tablet Take 1 tablet (50 mg total) by mouth daily.   Turmeric (QC TUMERIC COMPLEX) 500 MG CAPS Take 500 mg by mouth daily.   vitamin B-12 (CYANOCOBALAMIN) 1000 MCG tablet TAKE 2 TABLETS BY MOUTH EVERY DAY   No facility-administered  encounter medications on file as of 07/30/2018.     Review of Systems  Constitutional: Negative for appetite change, chills, fatigue, fever and unexpected weight change.  HENT: Negative for congestion, rhinorrhea, sinus pressure, sinus pain, sneezing and sore throat.   Eyes: Negative for discharge, redness, itching and visual disturbance.  Respiratory: Negative for cough, chest tightness, shortness of breath and wheezing.   Cardiovascular: Negative for chest pain, palpitations and leg swelling.  Gastrointestinal: Negative for abdominal distention, abdominal pain, constipation, diarrhea, nausea and vomiting.  Musculoskeletal: Positive for gait problem. Negative for neck pain and neck stiffness.       Gets around with walker   Skin: Negative for color change, pallor and rash.  Neurological: Negative for dizziness, weakness, light-headedness and headaches.       Reports  numbness of fingers   Psychiatric/Behavioral: Negative for behavioral problems, confusion and sleep disturbance. The patient is not nervous/anxious.     Immunization History  Administered Date(s) Administered   Influenza Whole 10/02/2010   Influenza, High Dose Seasonal PF 10/25/2016, 10/12/2017   PPD Test 01/31/2015   Pneumococcal Conjugate-13 11/17/2017   Pneumococcal-Unspecified 01/08/2012   Tdap 09/14/2017   Pertinent  Health Maintenance Due  Topic Date Due   INFLUENZA VACCINE  08/08/2018   DEXA SCAN  Completed   PNA vac Low Risk Adult  Completed   Fall Risk  07/30/2018 06/17/2018 05/18/2018 05/13/2018 04/30/2018  Falls in the past year? 1 1 0 1 0  Number falls in past yr: 0 0 - 1 0  Injury with Fall? 0 0 - 0 0  Comment - - - - -  Risk for fall due to : - - - - -  Risk for fall due to: Comment - - - - -  Follow up - - - - -  Comment - - - - -   There were no vitals filed for this visit. There is no height or weight on file to calculate BMI. Physical Exam  Unable to complete on Telephone visit.   Labs  reviewed: Recent Labs    03/02/18 1353 03/09/18 1001 03/12/18 1503  NA 143 142 143  K 4.3 3.9 4.1  CL 109 106 109  CO2 '27 27 27  ' GLUCOSE 79 89 87  BUN 25 24 26*  CREATININE 1.30* 1.41* 1.48*  CALCIUM 10.2 10.6* 10.1   Recent Labs    12/21/17 1527 12/29/17 1025 02/06/18 1429 02/14/18 0242  AST 48* 38 27 23  ALT 29 33 15 14  ALKPHOS 50 76  --  58  BILITOT 0.6 1.2 0.4 0.4  PROT 6.9 6.0* 6.8 6.0*  ALBUMIN 3.6 2.2*  --  3.0*   Recent Labs    03/02/18 1353 03/09/18 1001 03/12/18 1503 06/16/18 1022  WBC 3.5* 3.7* 3.2* 2.6*  NEUTROABS 1,939 2,202 1,696  --   HGB 9.9* 10.1* 9.7* 10.5*  HCT 29.6* 30.3* 29.7* 32.8*  MCV 88.4 88.3 89.2 93.2  PLT 319 273 253 227   Lab Results  Component Value Date   TSH 1.61 02/06/2018   Lab Results  Component Value Date   HGBA1C 5.9 (H) 02/13/2018   Lab Results  Component Value Date   CHOL 179 02/13/2018   HDL 63 02/13/2018   LDLCALC 103 (H) 02/13/2018   TRIG 65 02/13/2018   CHOLHDL 2.8 02/13/2018    Significant Diagnostic Results in last 30 days:  Vas [place] Lower Extremity Venous Reflux  Result Date: 07/09/2018  Lower Venous Reflux Study Indications: Swelling, Edema, and Pain.  Limitations: Patient was in constant discomfort and limited mobility. Performing Technologist: Delorise Shiner RVT  Examination Guidelines: A complete evaluation includes B-mode imaging, spectral Doppler, color Doppler, and power Doppler as needed of all accessible portions of each vessel. Bilateral testing is considered an integral part of a complete examination. Limited examinations for reoccurring indications may be performed as noted. The reflux portion of the exam is performed with the patient in reverse Trendelenburg.  Venous Reflux Times Normal value < 0.5 sec +------------------------------+----------+---------+                                 Right (ms) Left (ms)  +------------------------------+----------+---------+  CFV  1328.00     1115.00    +------------------------------+----------+---------+  FV                             2384.00               +------------------------------+----------+---------+  GSV at Saphenofemoral junction 807.00     851.00     +------------------------------+----------+---------+  GSV prox thigh                 814.00     587.00     +------------------------------+----------+---------+  GSV mid thigh                  1936.00    1108.00    +------------------------------+----------+---------+  GSV dist thigh                 1174.00    2010.00    +------------------------------+----------+---------+  GSV at knee                    1819.00    1108.00    +------------------------------+----------+---------+  SSV origin                     2978.00               +------------------------------+----------+---------+  SSV prox                       3161.00    1452.00    +------------------------------+----------+---------+  SSV mid                                   667.00     +------------------------------+----------+---------+ +------------------------------+----------+---------+  VEIN DIAMETERS:                Right (cm) Left (cm)  +------------------------------+----------+---------+  GSV at Saphenofemoral junction 0..605     0.695      +------------------------------+----------+---------+  GSV at prox thigh              0.563      0.602      +------------------------------+----------+---------+  GSV at mid thigh               0.660      0.647      +------------------------------+----------+---------+  GSV at distal thigh            0.523      0.540      +------------------------------+----------+---------+  GSV at knee                    0.584      0.587      +------------------------------+----------+---------+  GSV prox calf                  0.452      0.319      +------------------------------+----------+---------+  GSV mid calf                   0.251      0.536       +------------------------------+----------+---------+  SSV origin                     0.689      0.165      +------------------------------+----------+---------+  SSV prox  0.636      0.319      +------------------------------+----------+---------+  SSV mid                        0.515      0.228      +------------------------------+----------+---------+  Right Reflux Technical Findings: No evidence of DVT, SVT, or Baker's cyst. The sapheno-femoral junction is incompetent. GSV reflux demonstrated throughout the vessel. SSV reflux demonstrated throughout the vessel. Deep venous reflux identified in the common femoral and femoral veins. Left Reflux Technical Findings: No evidence of DVT, SVT, or Baker's cyst. The sapheno-femoral junction is incompetent. GSV reflux demonstrated throughout the vessel. SSV reflux demonstrated throughout the vessel. Deep venous reflux demonstrated in the common femoral vein.  Summary: Right: No reflux was noted in the popliteal vein. Abnormal reflux times were noted in the common femoral vein, femoral vein in the thigh, great saphenous vein at the saphenofemoral junction, great saphenous vein at the proximal thigh, great saphenous vein at the mid thigh, great saphenous vein at the distal thigh, great saphenous vein at the knee, origin of the small saphenous vein, and proximal small saphenous vein. Evidence of chronic venous insufficiency is detected in the great saphenous vein, small saphenous vein, and deep venous system. There is no evidence of deep vein thrombosis in the lower extremity. There is no evidence of superficial venous thrombosis. No cystic structure found in the popliteal fossa. Left: No reflux was noted in the femoral vein in the thigh, and popliteal vein. Abnormal reflux times were noted in the common femoral vein, great saphenous vein at the saphenofemoral junction, great saphenous vein at the proximal thigh, great saphenous vein at the mid thigh,  great saphenous vein at the distal thigh, great saphenous vein at the knee, great saphenous vein at the proximal calf, prox small saphenous vein, and mid small saphenous vein. Evidence of chronic venous insufficiency is detected in  the great saphenous vein, small saphenous vein, and deep venous system. There is no evidence of deep vein thrombosis in the lower extremity. There is no evidence of superficial venous thrombosis. No cystic structure found in the popliteal fossa.  *See table(s) above for measurements and observations. Electronically signed by Deitra Mayo MD on 07/09/2018 at 3:06:42 PM.    Final     Assessment/Plan  1. 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  - CBC with Differential/Platelet; Future - CMP with eGFR([facility]); Future  2. Anemia due to vitamin B12 deficiency, unspecified B12 deficiency type Reports no headache,dizziness or fatigue.latest labs reviewed Hgb 10.1 (03/09/2018). - CBC with Differential/Platelet; Future  3. Tingling in extremities Continue on vit B12 supplement  - Vitamin B12; Future  Family/ staff Communication: Reviewed plan of care with patient.  Labs/tests ordered: - CBC with Differential/Platelet; Future - CMP with eGFR([facility]); Future - Vitamin B12; Future  Melayna Robarts C Thompson Mckim, NP

## 2018-07-30 NOTE — Patient Instructions (Signed)
1. check blood pressure at least daily and record.will Notify provider if SBP blood pressure still elevated.she will follow up in 2 weeks at the office to recheck blood pressure  2. Please get labs drawn as soon as possible.

## 2018-07-31 ENCOUNTER — Other Ambulatory Visit: Payer: PPO

## 2018-07-31 ENCOUNTER — Other Ambulatory Visit: Payer: Self-pay

## 2018-07-31 ENCOUNTER — Other Ambulatory Visit: Payer: Self-pay | Admitting: *Deleted

## 2018-07-31 DIAGNOSIS — D519 Vitamin B12 deficiency anemia, unspecified: Secondary | ICD-10-CM

## 2018-07-31 DIAGNOSIS — I130 Hypertensive heart and chronic kidney disease with heart failure and stage 1 through stage 4 chronic kidney disease, or unspecified chronic kidney disease: Secondary | ICD-10-CM | POA: Diagnosis not present

## 2018-07-31 DIAGNOSIS — R202 Paresthesia of skin: Secondary | ICD-10-CM

## 2018-07-31 DIAGNOSIS — R7989 Other specified abnormal findings of blood chemistry: Secondary | ICD-10-CM

## 2018-08-01 LAB — CBC WITH DIFFERENTIAL/PLATELET
Absolute Monocytes: 389 cells/uL (ref 200–950)
Basophils Absolute: 39 cells/uL (ref 0–200)
Basophils Relative: 1.4 %
Eosinophils Absolute: 70 cells/uL (ref 15–500)
Eosinophils Relative: 2.5 %
HCT: 32.4 % — ABNORMAL LOW (ref 35.0–45.0)
Hemoglobin: 10.6 g/dL — ABNORMAL LOW (ref 11.7–15.5)
Lymphs Abs: 770 cells/uL — ABNORMAL LOW (ref 850–3900)
MCH: 30.3 pg (ref 27.0–33.0)
MCHC: 32.7 g/dL (ref 32.0–36.0)
MCV: 92.6 fL (ref 80.0–100.0)
MPV: 10.7 fL (ref 7.5–12.5)
Monocytes Relative: 13.9 %
Neutro Abs: 1532 cells/uL (ref 1500–7800)
Neutrophils Relative %: 54.7 %
Platelets: 224 10*3/uL (ref 140–400)
RBC: 3.5 10*6/uL — ABNORMAL LOW (ref 3.80–5.10)
RDW: 13.7 % (ref 11.0–15.0)
Total Lymphocyte: 27.5 %
WBC: 2.8 10*3/uL — ABNORMAL LOW (ref 3.8–10.8)

## 2018-08-01 LAB — COMPLETE METABOLIC PANEL WITH GFR
AG Ratio: 1.7 (calc) (ref 1.0–2.5)
ALT: 11 U/L (ref 6–29)
AST: 16 U/L (ref 10–35)
Albumin: 4.1 g/dL (ref 3.6–5.1)
Alkaline phosphatase (APISO): 55 U/L (ref 37–153)
BUN/Creatinine Ratio: 17 (calc) (ref 6–22)
BUN: 23 mg/dL (ref 7–25)
CO2: 28 mmol/L (ref 20–32)
Calcium: 10.3 mg/dL (ref 8.6–10.4)
Chloride: 110 mmol/L (ref 98–110)
Creat: 1.36 mg/dL — ABNORMAL HIGH (ref 0.60–0.88)
GFR, Est African American: 42 mL/min/{1.73_m2} — ABNORMAL LOW (ref >=60)
GFR, Est Non African American: 37 mL/min/{1.73_m2} — ABNORMAL LOW (ref >=60)
Globulin: 2.4 g/dL (calc) (ref 1.9–3.7)
Glucose, Bld: 98 mg/dL (ref 65–99)
Potassium: 3.7 mmol/L (ref 3.5–5.3)
Sodium: 144 mmol/L (ref 135–146)
Total Bilirubin: 0.5 mg/dL (ref 0.2–1.2)
Total Protein: 6.5 g/dL (ref 6.1–8.1)

## 2018-08-01 LAB — VITAMIN B12: Vitamin B-12: >2000 pg/mL — ABNORMAL HIGH (ref 200–1100)

## 2018-08-03 ENCOUNTER — Other Ambulatory Visit: Payer: PPO

## 2018-08-06 ENCOUNTER — Telehealth: Payer: Self-pay | Admitting: *Deleted

## 2018-08-06 ENCOUNTER — Other Ambulatory Visit: Payer: Self-pay | Admitting: Nurse Practitioner

## 2018-08-06 NOTE — Telephone Encounter (Signed)
Returning Susan [name] telephone voice message about local medical supply stores where she could be measured and fitted for compression hose.   I gave  her phone numbers and location for [facility] and [employer].  I also reminded her that [facility] stocks compression hose and that we could measure and fit her here.

## 2018-08-07 ENCOUNTER — Telehealth: Payer: Self-pay

## 2018-08-07 NOTE — Telephone Encounter (Signed)
Spoke with patient, patient verbalized understanding of Jessica's response

## 2018-08-07 NOTE — Telephone Encounter (Signed)
Patient called c/o high blood pressure  1. What are your blood pressure readings? 186/93, 161/80  2. When were the above readings checked? 15 min ago (12:45 pm), before medication   3 Any associated symptoms like headache, dizziness, chest pain, shortness of breath, weakness or numbness of leg/arm, or speech changes? No  4. Did you take your blood pressure medications (if patient on b/p medication, check medication list)? Not prior to readings   5. Did you take them at least a hour prior to checking your blood pressure? No  6. Have you been watching your salt in your diet? Yes  7. Have you eaten any salty foods like breakfast meats, ham, soups, canned foods, or snacks? Did not eat breakfast  I will forward your response to your provider and call you with instructions, if your symptoms persist or progress seek medical attention at urgent care or the emergency room.

## 2018-08-07 NOTE — Telephone Encounter (Signed)
Recommend to recheck at least 1 after she has taken her medication and make sure she has been sitting down at least 5 mins before checking

## 2018-08-11 ENCOUNTER — Telehealth: Payer: Self-pay | Admitting: Hematology and Oncology

## 2018-08-11 ENCOUNTER — Telehealth: Payer: Self-pay | Admitting: Hematology

## 2018-08-11 NOTE — Telephone Encounter (Signed)
Received a new hem referral for abnl cbc. I cld and spoke to both the pt and her daughter to schedule an appt w/Dr. Lindi Adie on 8/31 at 345pm. Both have been made aware to arrive 20 minutes early.

## 2018-08-11 NOTE — Telephone Encounter (Signed)
A new hem appt has been scheduled for the pt to see Dr. Irene Limbo on 8/25 at 1pm.

## 2018-08-13 ENCOUNTER — Encounter: Payer: Self-pay | Admitting: Nurse Practitioner

## 2018-08-13 ENCOUNTER — Other Ambulatory Visit: Payer: Self-pay

## 2018-08-13 ENCOUNTER — Ambulatory Visit (INDEPENDENT_AMBULATORY_CARE_PROVIDER_SITE_OTHER): Payer: PPO | Admitting: Nurse Practitioner

## 2018-08-13 DIAGNOSIS — E559 Vitamin D deficiency, unspecified: Secondary | ICD-10-CM | POA: Diagnosis not present

## 2018-08-13 DIAGNOSIS — F330 Major depressive disorder, recurrent, mild: Secondary | ICD-10-CM

## 2018-08-13 DIAGNOSIS — I10 Essential (primary) hypertension: Secondary | ICD-10-CM

## 2018-08-13 NOTE — Progress Notes (Signed)
This service is provided via telemedicine  No vital signs collected/recorded due to the encounter was a telemedicine visit.   Location of patient (ex: home, work):  Home  Patient consents to a telephone visit:  Yes  Location of the provider (ex: office, home):  [facility], Office   Name of any referring provider:  N/A  Names of all persons participating in the telemedicine service and their role in the encounter:  Susan [name], Susan Mustache, NP, Susan [name] (daughter) and Patient   Time spent on call: 16 min with medical assistant       Careteam: Patient Care Team: Susan Chandler, NP as PCP - General (Geriatric Medicine) Susan Margarita, MD as PCP - Cardiology (Cardiology)  Advanced Directive information    Allergies  Allergen Reactions  . Ace Inhibitors Cough  . Lipitor [Atorvastatin] Swelling  . Simvastatin Other (See Comments)    Myalgias     . Latex Itching    Chief Complaint  Patient presents with  . Follow-up    2 week follow-up on blood pressure. Discuss SCAT recertification. Telephone visit   . Medication Management    Discuss recommended dose of vit D  . Medication Management     HPI: Patient is a 78 y.o. female  Following up blood pressure 163/85 she has been taking her blood pressure BEFORE medication.  Daughter states pt knows she is supposed to be taking blood pressure after medication.  Pt states she wrote this down but did not read reminder.  Blood pressure has been high.   Has to mail scat re certification.  Has been using scat and has been very beneficial.   Vit D def- Taking 2 vit d supplements, one with calcium (osteopenia) and one without.   Depression- continues to be there, has Zoloft and Wellbutrin.   Review of Systems:  Review of Systems  Constitutional: Negative for chills and fever.  Respiratory: Negative for cough and shortness of breath.   Cardiovascular: Positive for leg swelling. Negative for chest  pain.  Gastrointestinal: Negative for abdominal pain, diarrhea and vomiting.  Musculoskeletal: Positive for joint pain (left knee).  Neurological: Negative for dizziness and headaches.  Psychiatric/Behavioral: Positive for depression (continues) and memory loss.    Past Medical History:  Diagnosis Date  . Anxiety   . Arthritis    "legs, back" (07/29/2012)  . Cellulitis and abscess of leg 12/29/2017  . Chronic diastolic (congestive) heart failure ([place])   . Chronic lower back pain   . Complication of anesthesia    "I have apnea" (07/29/2012)  . Dementia ([place]) 03/12/2018  . 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 Family history of anesthesia complication    "some wake up during OR; some are hard to wake up; some both" (07/29/2012)  . GERD (gastroesophageal reflux disease)   . 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 ([place])   . History of CVA (cerebrovascular accident) 02/01/2015  . History of stomach ulcers 1980's  . Hyperlipidemia   . Hyperparathyroidism, primary ([place]) 04/22/2012  . Hypertensive heart disease   . Incontinent of urine    wears pads  . Major depressive disorder, recurrent episode, severe ([place]) 11/21/2015  . OSA on CPAP   . Osteoarthritis of right knee   . Pedal edema   . Persistent atrial fibrillation    CHADS2VASC score is 7 - on chronic anticoagulation with Apixaban  . Spinal stenosis   . Umbilical hernia    "  unrepaired" (07/29/2012)  . Varicose veins    "BLE" (07/29/2012)  . Vascular dementia ([place])   . Venous stasis dermatitis of left lower extremity 04/29/2018   Past Surgical History:  Procedure Laterality Date  . CATARACT EXTRACTION    . CHOLECYSTECTOMY  ~ 2010  . COLONOSCOPY    . IUD REMOVAL  1980's  . PARATHYROIDECTOMY N/A 06/16/2012   Procedure: NECK EXPLORATION AND LEFT SUPERIOR PARATHYROIDECTOMY;  Surgeon: Earnstine Regal, MD;  Location: [facility];  Service: General;  Laterality: N/A;  . TOTAL KNEE ARTHROPLASTY Right 07/29/2012   . TOTAL KNEE ARTHROPLASTY Right 07/29/2012   Procedure: TOTAL KNEE ARTHROPLASTY;  Surgeon: Ninetta Lights, MD;  Location: [place];  Service: Orthopedics;  Laterality: Right;   Social History:   reports that she quit smoking about 40 years ago. Her smoking use included cigarettes. She has a 30.00 pack-year smoking history. She has never used smokeless tobacco. She reports that she does not drink alcohol or use drugs.  Family History  Problem Relation Age of Onset  . Hypertension Mother        deceased  . Stroke Mother   . Breast cancer Mother   . Lung cancer Father        deceased  . Diabetes Daughter   . Hypertension Daughter     Medications: Patient's Medications  New Prescriptions   No medications on file  Previous Medications   ACETAMINOPHEN (TYLENOL) 500 MG TABLET    Take 2 tablets (1,000 mg total) by mouth as needed.   APIXABAN (ELIQUIS) 5 MG TABS TABLET    Take 1 tablet (5 mg total) by mouth every 12 (twelve) hours.   APPLE CID VN-GRN TEA-BIT OR-CR (APPLE CIDER VINEGAR PLUS PO)    Take 1,245 mg by mouth daily.   BUPROPION (WELLBUTRIN SR) 200 MG 12 HR TABLET    TAKE 1 TABLET BY MOUTH EVERY DAY   CALCIUM CARBONATE-VITAMIN D 600-400 MG-UNIT TABLET    TAKE 1 TABLET BY MOUTH EVERY DAY   CARVEDILOL (COREG) 12.5 MG TABLET    TAKE 1 TABLET BY MOUTH 2 TIMES DAILY WITH MEALS   CHOLECALCIFEROL (VITAMIN D) 25 MCG (1000 UT) TABLET    TAKE 1 TABLET BY MOUTH EVERY DAY   DICLOFENAC SODIUM (VOLTAREN) 1 % GEL    Apply 2 g topically 4 (four) times daily.   DOK 100 MG CAPSULE    TAKE 1 CAPSULE BY MOUTH 2 TIMES DAILY   DONEPEZIL (ARICEPT) 10 MG TABLET    Take 1 tablet (10 mg total) by mouth at bedtime. For memory loss   FLORASTOR 250 MG CAPSULE    TAKE 1 CAPSULE BY MOUTH 2 TIMES DAILY   FUROSEMIDE (LASIX) 20 MG TABLET    TAKE 1 TABLET BY MOUTH EVERY DAY   GABAPENTIN (NEURONTIN) 300 MG CAPSULE    Take 300 mg by mouth as needed.   HYDRALAZINE (APRESOLINE) 25 MG TABLET    TAKE 1 TABLET BY MOUTH 3  TIMES DAILY   LOSARTAN (COZAAR) 50 MG TABLET    TAKE 1 TABLET BY MOUTH EVERY DAY   MENTHOL, TOPICAL ANALGESIC, (BIOFREEZE) 4 % GEL    Apply 3 oz topically 3 (three) times daily. For knee pain   MULTIPLE VITAMIN (GNP ESSENTIAL ONE DAILY) TABS    TAKE 1 TABLET BY MOUTH EVERY DAY   PANTOPRAZOLE (PROTONIX) 40 MG TABLET    TAKE 1 TABLET BY MOUTH EVERY DAY   POLY-IRON 150 150 MG CAPSULE  TAKE 1 CAPSULE BY MOUTH EVERY DAY   PRAVASTATIN (PRAVACHOL) 40 MG TABLET    TAKE 1 TABLET BY MOUTH EVERY DAY   SERTRALINE (ZOLOFT) 50 MG TABLET    Take 1 tablet (50 mg total) by mouth daily.   TURMERIC (QC TUMERIC COMPLEX) 500 MG CAPS    Take 500 mg by mouth daily.   VITAMIN B-12 (CYANOCOBALAMIN) 1000 MCG TABLET    TAKE 2 TABLETS BY MOUTH EVERY DAY  Modified Medications   No medications on file  Discontinued Medications   No medications on file    Physical Exam:  There were no vitals filed for this visit. There is no height or weight on file to calculate BMI. Wt Readings from Last 3 Encounters:  07/28/18 165 lb (74.8 kg)  07/09/18 189 lb (85.7 kg)  06/16/18 189 lb (85.7 kg)      Labs reviewed: Basic Metabolic Panel: Recent Labs    09/05/17 0803  02/06/18 1429  03/09/18 1001 03/12/18 1503 07/31/18 0906  NA 142   < > 141   < > 142 143 144  K 4.2   < > 4.2   < > 3.9 4.1 3.7  CL 109   < > 106   < > 106 109 110  CO2 26   < > 27   < > 27 27 28   GLUCOSE 87   < > 123   < > 89 87 98  BUN 24   < > 21   < > 24 26* 23  CREATININE 1.46*   < > 1.43*   < > 1.41* 1.48* 1.36*  CALCIUM 10.3   < > 10.7*   < > 10.6* 10.1 10.3  TSH 1.95  --  1.61  --   --   --   --    < > = values in this interval not displayed.   Liver Function Tests: Recent Labs    12/21/17 1527 12/29/17 1025 02/06/18 1429 02/14/18 0242 07/31/18 0906  AST 48* 38 27 23 16   ALT 29 33 15 14 11   ALKPHOS 50 76  --  58  --   BILITOT 0.6 1.2 0.4 0.4 0.5  PROT 6.9 6.0* 6.8 6.0* 6.5  ALBUMIN 3.6 2.2*  --  3.0*  --    No results for  input(s): LIPASE, AMYLASE in the last 8760 hours. No results for input(s): AMMONIA in the last 8760 hours. CBC: Recent Labs    03/09/18 1001 03/12/18 1503 06/16/18 1022 07/31/18 0906  WBC 3.7* 3.2* 2.6* 2.8*  NEUTROABS 2,202 1,696  --  1,532  HGB 10.1* 9.7* 10.5* 10.6*  HCT 30.3* 29.7* 32.8* 32.4*  MCV 88.3 89.2 93.2 92.6  PLT 273 253 227 224   Lipid Panel: Recent Labs    09/05/17 0803 02/13/18 0423  CHOL 199 179  HDL 65 63  LDLCALC 117* 103*  TRIG 73 65  CHOLHDL 3.1 2.8   TSH: Recent Labs    09/05/17 0803 02/06/18 1429  TSH 1.95 1.61   A1C: Lab Results  Component Value Date   HGBA1C 5.9 (H) 02/13/2018     Assessment/Plan 1. Essential hypertension -educated blood pressure needs to be taken 1-2 hours after medication has been taken. Continue dietary modifications. Goal BP <140/90. Can call office with readings or await routine follow up which is scheduled for next month.  2. Mild episode of recurrent major depressive disorder ([place]) Ongoing, continues on wellbutrin and   3. Vitamin D deficiency -discussed Vit  D supplements, to continue as directed on medication list.   Next appt: 09/10/2018 sooner if needed  Kitt Minardi K. Harle Battiest  [facility] [phone]    Virtual Visit via Telephone Note  I connected with pt and daughter on 08/13/18 at 10:00 AM EDT by telephone and verified that I am speaking with the correct person using two identifiers.  Location: Patient: home Provider: office    I discussed the limitations, risks, security and privacy concerns of performing an evaluation and management service by telephone and the availability of in person appointments. I also discussed with the patient that there may be a patient responsible charge related to this service. The patient expressed understanding and agreed to proceed.   I discussed the assessment and treatment plan with the patient. The patient was provided an  opportunity to ask questions and all were answered. The patient agreed with the plan and demonstrated an understanding of the instructions.   The patient was advised to call back or seek an in-person evaluation if the symptoms worsen or if the condition fails to improve as anticipated.  I provided 12 minutes of non-face-to-face time during this encounter.  Susan [name]. Harle Battiest Avs printed and mailed

## 2018-08-14 ENCOUNTER — Telehealth: Payer: Self-pay | Admitting: *Deleted

## 2018-08-14 NOTE — Telephone Encounter (Signed)
Received [employer]-SCAT Form by fax for patient.  Placed form in [place] folder to review, fill out and sign.  Fax back to [phone] SCAT Eligibility Dept.

## 2018-08-31 ENCOUNTER — Encounter: Payer: Self-pay | Admitting: Nurse Practitioner

## 2018-09-01 ENCOUNTER — Encounter: Payer: PPO | Admitting: Hematology

## 2018-09-02 ENCOUNTER — Encounter: Payer: PPO | Admitting: Hematology

## 2018-09-06 NOTE — Progress Notes (Signed)
[facility] CONSULT NOTE  Patient Care Team: Sonia Side., FNP as PCP - General (Family Medicine) Sueanne Margarita, MD as PCP - Cardiology (Cardiology)  CHIEF COMPLAINTS/PURPOSE OF CONSULTATION:  Normocytic anemia and leukopenia  HISTORY OF PRESENTING ILLNESS:  Susan [name] 78 y.o. female is here because of recent diagnosis of anemia. Labs on 07/31/18 showed: Hg 10.6, HCT 32.4, MCV 92.6, WBC 2.8. She presents to the clinic today for initial evaluation and discussion of treatment options.  Patient has had anemia for the past 2 years.  Leukopenia has been ongoing since February 2020. She has had multiple major medical issues including diastolic congestive heart failure and cardiomyopathy and prior stroke.  She has had chronic kidney disease stage III.  She feels tired most of the time.  She has not required any blood transfusion.  Fortunately she has not had any frequent infections.  She walks with the help of a walker.  I reviewed her records extensively and collaborated the history with the patient.  MEDICAL HISTORY:  Past Medical History:  Diagnosis Date  . Anxiety   . Arthritis    "legs, back" (07/29/2012)  . Cellulitis and abscess of leg 12/29/2017  . Chronic diastolic (congestive) heart failure ([place])   . Chronic lower back pain   . Complication of anesthesia    "I have apnea" (07/29/2012)  . Dementia ([place]) 03/12/2018  . 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 Family history of anesthesia complication    "some wake up during OR; some are hard to wake up; some both" (07/29/2012)  . GERD (gastroesophageal reflux disease)   . 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 ([place])   . History of CVA (cerebrovascular accident) 02/01/2015  . History of stomach ulcers 1980's  . Hyperlipidemia   . Hyperparathyroidism, primary ([place]) 04/22/2012  . Hypertensive heart disease   . Incontinent of urine    wears pads  . Major depressive disorder,  recurrent episode, severe ([place]) 11/21/2015  . OSA on CPAP   . Osteoarthritis of right knee   . Pedal edema   . Persistent atrial fibrillation    CHADS2VASC score is 7 - on chronic anticoagulation with Apixaban  . Spinal stenosis   . Umbilical hernia    "unrepaired" (07/29/2012)  . Varicose veins    "BLE" (07/29/2012)  . Vascular dementia ([place])   . Venous stasis dermatitis of left lower extremity 04/29/2018    SURGICAL HISTORY: Past Surgical History:  Procedure Laterality Date  . CATARACT EXTRACTION    . CHOLECYSTECTOMY  ~ 2010  . COLONOSCOPY    . IUD REMOVAL  1980's  . PARATHYROIDECTOMY N/A 06/16/2012   Procedure: NECK EXPLORATION AND LEFT SUPERIOR PARATHYROIDECTOMY;  Surgeon: Earnstine Regal, MD;  Location: [facility];  Service: General;  Laterality: N/A;  . 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 TOTAL KNEE ARTHROPLASTY Right 07/29/2012   Procedure: TOTAL KNEE ARTHROPLASTY;  Surgeon: Ninetta Lights, MD;  Location: [place];  Service: Orthopedics;  Laterality: Right;    SOCIAL HISTORY: Social History   Socioeconomic History  . Marital status: Widowed    Spouse name: Not on file  . Number of children: Not on file  . Years of education: Not on file  . Highest education level: Not on file  Occupational History  . Occupation: retired    Fish farm manager: RETIRED  Social Needs  . Financial resource strain: Not hard at all  . Food insecurity  Worry: Never true    Inability: Never true  . Transportation needs    Medical: No    Non-medical: No  Tobacco Use  . Smoking status: Former Smoker    Packs/day: 1.00    Years: 30.00    Pack years: 30.00    Types: Cigarettes    Quit date: 01/07/1978    Years since quitting: 40.6  . Smokeless tobacco: Never Used  Substance and Sexual Activity  . Alcohol use: No  . Drug use: No  . Sexual activity: Not Currently    Comment: intercourse age 1,less than 5 secxual partners,  Lifestyle  . Physical activity    Days per week: 3 days    Minutes  per session: 30 min  . Stress: Rather much  Relationships  . Social connections    Talks on phone: More than three times a week    Gets together: More than three times a week    Attends religious service: More than 4 times per year    Active member of club or organization: No    Attends meetings of clubs or organizations: Never    Relationship status: Widowed  . Intimate partner violence    Fear of current or ex partner: No    Emotionally abused: No    Physically abused: No    Forced sexual activity: No  Other Topics Concern  . Not on file  Social History Narrative   Lives at home alone   Right-handed   Caffeine: 1 cup of coffee per day         Diet:      Caffeine:      Married, if yes what year:       Do you live in a house, apartment, assisted living, condo, trailer, ect:      Pets:      Current/Past profession:      Exercise:         Living Will: Yes   DNR: No   POA/HPOA: Yes      Functional Status:   Do you have difficulty bathing or dressing yourself? Yes   Do you have difficulty preparing food or eating? No   Do you have difficulty managing your medications? Yes   Do you have difficulty managing your finances? Yes   Do you have difficulty affording your medications? Yes    FAMILY HISTORY: Family History  Problem Relation Age of Onset  . Hypertension Mother        deceased  . Stroke Mother   . Breast cancer Mother   . Lung cancer Father        deceased  . Diabetes Daughter   . Hypertension Daughter     ALLERGIES:  is allergic to ace inhibitors; lipitor [atorvastatin]; simvastatin; and latex.  MEDICATIONS:  Current Outpatient Medications  Medication Sig Dispense Refill  . acetaminophen (TYLENOL) 500 MG tablet Take 2 tablets (1,000 mg total) by mouth as needed. 30 tablet 2  . apixaban (ELIQUIS) 5 MG TABS tablet Take 1 tablet (5 mg total) by mouth every 12 (twelve) hours. 60 tablet 4  . Apple Cid Vn-Grn Tea-Bit Or-Cr (APPLE CIDER VINEGAR PLUS  PO) Take 1,245 mg by mouth daily.    Marland Kitchen buPROPion (WELLBUTRIN SR) 200 MG 12 hr tablet TAKE 1 TABLET BY MOUTH EVERY DAY 30 tablet 5  . Calcium Carbonate-Vitamin D 600-400 MG-UNIT tablet TAKE 1 TABLET BY MOUTH EVERY DAY 30 tablet 2  . carvedilol (COREG) 12.5 MG tablet TAKE 1  TABLET BY MOUTH 2 TIMES DAILY WITH MEALS 60 tablet 5  . cholecalciferol (VITAMIN D) 25 MCG (1000 UT) tablet TAKE 1 TABLET BY MOUTH EVERY DAY 30 tablet 2  . diclofenac sodium (VOLTAREN) 1 % GEL Apply 2 g topically 4 (four) times daily. 1 Tube 5  . DOK 100 MG capsule TAKE 1 CAPSULE BY MOUTH 2 TIMES DAILY 60 capsule 5  . donepezil (ARICEPT) 10 MG tablet Take 1 tablet (10 mg total) by mouth at bedtime. For memory loss 90 tablet 1  . FLORASTOR 250 MG capsule TAKE 1 CAPSULE BY MOUTH 2 TIMES DAILY 60 capsule 5  . furosemide (LASIX) 20 MG tablet TAKE 1 TABLET BY MOUTH EVERY DAY 90 tablet 0  . hydrALAZINE (APRESOLINE) 25 MG tablet TAKE 1 TABLET BY MOUTH 3 TIMES DAILY 90 tablet 5  . losartan (COZAAR) 50 MG tablet TAKE 1 TABLET BY MOUTH EVERY DAY 30 tablet 3  . Menthol, Topical Analgesic, (BIOFREEZE) 4 % GEL Apply 3 oz topically 3 (three) times daily. For knee pain 1 Tube 3  . Multiple Vitamin (GNP ESSENTIAL ONE DAILY) TABS TAKE 1 TABLET BY MOUTH EVERY DAY 30 tablet 11  . pantoprazole (PROTONIX) 40 MG tablet TAKE 1 TABLET BY MOUTH EVERY DAY 90 tablet 0  . POLY-IRON 150 150 MG capsule TAKE 1 CAPSULE BY MOUTH EVERY DAY 30 capsule 5  . pravastatin (PRAVACHOL) 40 MG tablet TAKE 1 TABLET BY MOUTH EVERY DAY 30 tablet 5  . sertraline (ZOLOFT) 50 MG tablet Take 1 tablet (50 mg total) by mouth daily. 30 tablet 5  . Turmeric (QC TUMERIC COMPLEX) 500 MG CAPS Take 500 mg by mouth daily.    . vitamin B-12 (CYANOCOBALAMIN) 1000 MCG tablet TAKE 2 TABLETS BY MOUTH EVERY DAY 60 tablet 5   No current facility-administered medications for this visit.     REVIEW OF SYSTEMS:   Constitutional: Denies fevers, chills or abnormal night sweats Eyes: Denies  blurriness of vision, double vision or watery eyes Ears, nose, mouth, throat, and face: Denies mucositis or sore throat Respiratory: Denies cough, dyspnea or wheezes Cardiovascular: Denies palpitation, chest discomfort or lower extremity swelling Gastrointestinal:  Denies nausea, heartburn or change in bowel habits Skin: Denies abnormal skin rashes Lymphatics: Denies new lymphadenopathy or easy bruising Neurological:Denies numbness, tingling or new weaknesses Behavioral/Psych: Mood is stable, no new changes  Breast: Denies any palpable lumps or discharge All other systems were reviewed with the patient and are negative.  PHYSICAL EXAMINATION: ECOG PERFORMANCE STATUS: 2 - Symptomatic, <50% confined to bed  Vitals:   09/07/18 1602  BP: (!) 164/90  Pulse: 61  Resp: 16  Temp: 98.3 F (36.8 C)  SpO2: 99%   Filed Weights   09/07/18 1602  Weight: 165 lb 11.2 oz (75.2 kg)    GENERAL:alert, no distress and comfortable SKIN: skin color, texture, turgor are normal, no rashes or significant lesions EYES: normal, conjunctiva are pink and non-injected, sclera clear OROPHARYNX:no exudate, no erythema and lips, buccal mucosa, and tongue normal  NECK: supple, thyroid normal size, non-tender, without nodularity LYMPH:  no palpable lymphadenopathy in the cervical, axillary or inguinal LUNGS: clear to auscultation and percussion with normal breathing effort HEART: regular rate & rhythm and no murmurs and no lower extremity edema ABDOMEN:abdomen soft, non-tender and normal bowel sounds Musculoskeletal:no cyanosis of digits and no clubbing  PSYCH: alert & oriented x 3 with fluent speech NEURO: no focal motor/sensory deficits  LABORATORY DATA:  I have reviewed the data as listed  Lab Results  Component Value Date   WBC 2.8 (L) 07/31/2018   HGB 10.6 (L) 07/31/2018   HCT 32.4 (L) 07/31/2018   MCV 92.6 07/31/2018   PLT 224 07/31/2018   Lab Results  Component Value Date   NA 144 07/31/2018    K 3.7 07/31/2018   CL 110 07/31/2018   CO2 28 07/31/2018    RADIOGRAPHIC STUDIES: I have personally reviewed the radiological reports and agreed with the findings in the report.  ASSESSMENT AND PLAN:  Anemia of chronic disease Normocytic anemia for the past 2 years if not longer. Lab review 08/01/2018: Hemoglobin 10.6, WBC 2.8, platelet count 224, ALC 0.7, ANC 1.5  Leukopenia ongoing since February 2020.  Differential diagnosis: 1. Anemia due to chronic kidney disease and heart disease 2. Hypothyroidism 3. Plasma cell disorders myeloma 4. Bone marrow dysfunction with MDS  Plan: Given the fact that the hemoglobin is not significantly diminished, we do not need to perform any invasive procedures like bone marrow biopsies at this time.  Patient does not require erythropoietin stimulating agents for hemoglobin of 10.6.  Recheck labs in 3 months. Workup performed: 1. CBC with differential to evaluate the smear 2. Haptoglobin, LDH, reticulocyte count to evaluate hemolysis 3. SPEP 4. Iron studies. Previous work-up with B12 was normal.  We will perform blood work 1 week before her visit in 3 months.  If the hemoglobin drops below 10 then we will consider giving erythropoietin stimulating agent therapy or even consider a bone marrow biopsy if necessary.     All questions were answered. The patient knows to call the clinic with any problems, questions or concerns.   Rulon Eisenmenger, MD 09/07/2018    I, Molly Dorshimer, am acting as scribe for Nicholas Lose, MD.  I have reviewed the above documentation for accuracy and completeness, and I agree with the above.

## 2018-09-07 ENCOUNTER — Other Ambulatory Visit: Payer: Self-pay

## 2018-09-07 ENCOUNTER — Inpatient Hospital Stay: Payer: PPO | Attending: Hematology | Admitting: Hematology and Oncology

## 2018-09-07 VITALS — BP 164/90 | HR 61 | Temp 98.3°F | Resp 16 | Ht 65.0 in | Wt 165.7 lb

## 2018-09-07 DIAGNOSIS — I4891 Unspecified atrial fibrillation: Secondary | ICD-10-CM | POA: Diagnosis not present

## 2018-09-07 DIAGNOSIS — F339 Major depressive disorder, recurrent, unspecified: Secondary | ICD-10-CM | POA: Diagnosis not present

## 2018-09-07 DIAGNOSIS — D6869 Other thrombophilia: Secondary | ICD-10-CM | POA: Diagnosis not present

## 2018-09-07 DIAGNOSIS — Z79899 Other long term (current) drug therapy: Secondary | ICD-10-CM | POA: Diagnosis not present

## 2018-09-07 DIAGNOSIS — F17211 Nicotine dependence, cigarettes, in remission: Secondary | ICD-10-CM | POA: Diagnosis not present

## 2018-09-07 DIAGNOSIS — I5032 Chronic diastolic (congestive) heart failure: Secondary | ICD-10-CM

## 2018-09-07 DIAGNOSIS — I69354 Hemiplegia and hemiparesis following cerebral infarction affecting left non-dominant side: Secondary | ICD-10-CM | POA: Diagnosis not present

## 2018-09-07 DIAGNOSIS — E039 Hypothyroidism, unspecified: Secondary | ICD-10-CM | POA: Diagnosis not present

## 2018-09-07 DIAGNOSIS — Z0001 Encounter for general adult medical examination with abnormal findings: Secondary | ICD-10-CM | POA: Diagnosis not present

## 2018-09-07 DIAGNOSIS — N183 Chronic kidney disease, stage 3 (moderate): Secondary | ICD-10-CM | POA: Diagnosis not present

## 2018-09-07 DIAGNOSIS — I130 Hypertensive heart and chronic kidney disease with heart failure and stage 1 through stage 4 chronic kidney disease, or unspecified chronic kidney disease: Secondary | ICD-10-CM | POA: Insufficient documentation

## 2018-09-07 DIAGNOSIS — Z803 Family history of malignant neoplasm of breast: Secondary | ICD-10-CM | POA: Diagnosis not present

## 2018-09-07 DIAGNOSIS — D631 Anemia in chronic kidney disease: Secondary | ICD-10-CM

## 2018-09-07 DIAGNOSIS — M545 Low back pain: Secondary | ICD-10-CM | POA: Diagnosis not present

## 2018-09-07 DIAGNOSIS — Z8673 Personal history of transient ischemic attack (TIA), and cerebral infarction without residual deficits: Secondary | ICD-10-CM | POA: Insufficient documentation

## 2018-09-07 DIAGNOSIS — Z0000 Encounter for general adult medical examination without abnormal findings: Secondary | ICD-10-CM | POA: Diagnosis not present

## 2018-09-07 DIAGNOSIS — D638 Anemia in other chronic diseases classified elsewhere: Secondary | ICD-10-CM | POA: Insufficient documentation

## 2018-09-07 DIAGNOSIS — D72819 Decreased white blood cell count, unspecified: Secondary | ICD-10-CM | POA: Diagnosis not present

## 2018-09-07 DIAGNOSIS — F419 Anxiety disorder, unspecified: Secondary | ICD-10-CM | POA: Diagnosis not present

## 2018-09-07 DIAGNOSIS — I739 Peripheral vascular disease, unspecified: Secondary | ICD-10-CM | POA: Diagnosis not present

## 2018-09-07 DIAGNOSIS — Z23 Encounter for immunization: Secondary | ICD-10-CM | POA: Diagnosis not present

## 2018-09-07 DIAGNOSIS — Z7901 Long term (current) use of anticoagulants: Secondary | ICD-10-CM | POA: Diagnosis not present

## 2018-09-07 NOTE — Assessment & Plan Note (Signed)
Normocytic anemia for the past 2 years if not longer. Lab review 08/01/2018: Hemoglobin 10.6, WBC 2.8, platelet count 224, ALC 0.7, ANC 1.5  Leukopenia ongoing since February 2020.  Differential diagnosis: 1. Anemia due to chronic kidney disease and heart disease 2. Hypothyroidism 3. Plasma cell disorders myeloma 4. Bone marrow dysfunction with MDS  Plan: Given the fact that the hemoglobin is not significantly diminished, we do not need to perform any invasive procedures like bone marrow biopsies at this time.  Patient does not require erythropoietin stimulating agents for hemoglobin of 10.6.  Recheck labs in 3 months. Workup performed: 1. CBC with differential to evaluate the smear 2. Haptoglobin, LDH, reticulocyte count to evaluate hemolysis 3. SPEP 4. Iron studies. Previous work-up with B12 was normal.  We will perform blood work 1 week before her visit in 3 months.  If the hemoglobin drops below 10 then we will consider giving erythropoietin stimulating agent therapy or even consider a bone marrow biopsy if necessary.

## 2018-09-08 ENCOUNTER — Telehealth: Payer: Self-pay

## 2018-09-08 ENCOUNTER — Telehealth: Payer: Self-pay | Admitting: Hematology and Oncology

## 2018-09-08 NOTE — Telephone Encounter (Signed)
I left a message regarding schedule  

## 2018-09-08 NOTE — Progress Notes (Signed)
Virtual Visit via Video Note   This visit type was conducted due to national recommendations for restrictions regarding the COVID-19 Pandemic (e.g. social distancing) in an effort to limit this patient's exposure and mitigate transmission in our community.  Due to her co-morbid illnesses, this patient is at least at moderate risk for complications without adequate follow up.  This format is felt to be most appropriate for this patient at this time.  All issues noted in this document were discussed and addressed.  A limited physical exam was performed with this format.  Please refer to the patient's chart for her consent to telehealth for [facility].   Evaluation Performed:  Follow-up visit  This visit type was conducted due to national recommendations for restrictions regarding the COVID-19 Pandemic (e.g. social distancing).  This format is felt to be most appropriate for this patient at this time.  All issues noted in this document were discussed and addressed.  No physical exam was performed (except for noted visual exam findings with Video Visits).  Please refer to the patient's chart (MyChart message for video visits and phone note for telephone visits) for the patient's consent to telehealth for [facility].  Date:  09/09/2018   ID:  Susan [name], DOB 1940/06/22, MRN [id number]  Patient Location:  Home  Provider location:   [place]  PCP:  Sonia Side., FNP  Cardiologist:  Fransico Him, MD  Electrophysiologist:  None   Chief Complaint:  HTN, OSA  History of Present Illness:    Susan [name] is a 78 y.o. female who presents via audio/video conferencing for a telehealth visit today.    Susan VONWALD is an 78 y.o. female with a hx of chronic diastolic heart failure, hyperlipidemia, hypertension and obstructive sleep apnea on CPAP.    She is here today for followup and is doing well.  She denies any chest pain or pressure, SOB, DOE, PND, orthopnea, LE edema, dizziness,  palpitations or syncope. She is compliant with her meds and is tolerating meds with no SE.    She is doing well with her CPAP device and thinks that she has gotten used to it.  She tolerates the mask and feels the pressure is adequate.  Since going on CPAP she feels rested in the am and has no significant daytime sleepiness.  She denies any significant mouth or nasal dryness or nasal congestion.  She does not think that he snores.    The patient does not have symptoms concerning for COVID-19 infection (fever, chills, cough, or new shortness of breath).   Prior CV studies:   The following studies were reviewed today:  PAP compliance download  Past Medical History:  Diagnosis Date  . Anxiety   . Arthritis    "legs, back" (07/29/2012)  . Cellulitis and abscess of leg 12/29/2017  . Chronic diastolic (congestive) heart failure ([place])   . Chronic lower back pain   . Complication of anesthesia    "I have apnea" (07/29/2012)  . Dementia ([place]) 03/12/2018  . 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 Family history of anesthesia complication    "some wake up during OR; some are hard to wake up; some both" (07/29/2012)  . GERD (gastroesophageal reflux disease)   . 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 ([place])   . History of CVA (cerebrovascular accident) 02/01/2015  . History of stomach ulcers 1980's  . Hyperlipidemia   . Hyperparathyroidism, primary ([place]) 04/22/2012  .  Hypertensive heart disease   . Incontinent of urine    wears pads  . Major depressive disorder, recurrent episode, severe ([place]) 11/21/2015  . OSA on CPAP   . Osteoarthritis of right knee   . Pedal edema   . Persistent atrial fibrillation    CHADS2VASC score is 7 - on chronic anticoagulation with Apixaban  . Spinal stenosis   . Umbilical hernia    "unrepaired" (07/29/2012)  . Varicose veins    "BLE" (07/29/2012)  . Vascular dementia ([place])   . Venous stasis dermatitis of left lower extremity  04/29/2018   Past Surgical History:  Procedure Laterality Date  . CATARACT EXTRACTION    . CHOLECYSTECTOMY  ~ 2010  . COLONOSCOPY    . IUD REMOVAL  1980's  . PARATHYROIDECTOMY N/A 06/16/2012   Procedure: NECK EXPLORATION AND LEFT SUPERIOR PARATHYROIDECTOMY;  Surgeon: Earnstine Regal, MD;  Location: [facility];  Service: General;  Laterality: N/A;  . 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 TOTAL KNEE ARTHROPLASTY Right 07/29/2012   Procedure: TOTAL KNEE ARTHROPLASTY;  Surgeon: Ninetta Lights, MD;  Location: [facility];  Service: Orthopedics;  Laterality: Right;     Current Meds  Medication Sig  . acetaminophen (TYLENOL) 500 MG tablet Take 2 tablets (1,000 mg total) by mouth as needed.  Marland Kitchen apixaban (ELIQUIS) 5 MG TABS tablet Take 1 tablet (5 mg total) by mouth every 12 (twelve) hours.  . Apple Cid Vn-Grn Tea-Bit Or-Cr (APPLE CIDER VINEGAR PLUS PO) Take 1,245 mg by mouth daily.  Marland Kitchen buPROPion (WELLBUTRIN SR) 200 MG 12 hr tablet TAKE 1 TABLET BY MOUTH EVERY DAY  . Calcium Carbonate-Vitamin D 600-400 MG-UNIT tablet TAKE 1 TABLET BY MOUTH EVERY DAY  . carvedilol (COREG) 12.5 MG tablet TAKE 1 TABLET BY MOUTH 2 TIMES DAILY WITH MEALS  . cholecalciferol (VITAMIN D) 25 MCG (1000 UT) tablet TAKE 1 TABLET BY MOUTH EVERY DAY  . diclofenac sodium (VOLTAREN) 1 % GEL Apply 2 g topically 4 (four) times daily.  Marland Kitchen DOK 100 MG capsule TAKE 1 CAPSULE BY MOUTH 2 TIMES DAILY  . donepezil (ARICEPT) 10 MG tablet Take 1 tablet (10 mg total) by mouth at bedtime. For memory loss  . FLORASTOR 250 MG capsule TAKE 1 CAPSULE BY MOUTH 2 TIMES DAILY  . furosemide (LASIX) 20 MG tablet TAKE 1 TABLET BY MOUTH EVERY DAY  . hydrALAZINE (APRESOLINE) 25 MG tablet TAKE 1 TABLET BY MOUTH 3 TIMES DAILY  . losartan (COZAAR) 50 MG tablet TAKE 1 TABLET BY MOUTH EVERY DAY  . Menthol, Topical Analgesic, (BIOFREEZE) 4 % GEL Apply 3 oz topically 3 (three) times daily. For knee pain  . Multiple Vitamin (GNP ESSENTIAL ONE DAILY) TABS TAKE 1 TABLET BY  MOUTH EVERY DAY  . pantoprazole (PROTONIX) 40 MG tablet TAKE 1 TABLET BY MOUTH EVERY DAY  . POLY-IRON 150 150 MG capsule TAKE 1 CAPSULE BY MOUTH EVERY DAY  . pravastatin (PRAVACHOL) 40 MG tablet TAKE 1 TABLET BY MOUTH EVERY DAY  . sertraline (ZOLOFT) 50 MG tablet Take 1 tablet (50 mg total) by mouth daily.  . Turmeric (QC TUMERIC COMPLEX) 500 MG CAPS Take 500 mg by mouth daily.  . vitamin B-12 (CYANOCOBALAMIN) 1000 MCG tablet TAKE 2 TABLETS BY MOUTH EVERY DAY     Allergies:   Ace inhibitors, Lipitor [atorvastatin], Simvastatin, and Latex   Social History   Tobacco Use  . Smoking status: Former Smoker    Packs/day: 1.00    Years: 30.00  Pack years: 30.00    Types: Cigarettes    Quit date: 01/07/1978    Years since quitting: 40.6  . Smokeless tobacco: Never Used  Substance Use Topics  . Alcohol use: No  . Drug use: No     Family Hx: The patient's family history includes Breast cancer in her mother; Diabetes in her daughter; Hypertension in her daughter and mother; Lung cancer in her father; Stroke in her mother.  ROS:   Please see the history of present illness.     All other systems reviewed and are negative.   Labs/Other Tests and Data Reviewed:    Recent Labs: 02/06/2018: TSH 1.61 07/31/2018: ALT 11; BUN 23; Creat 1.36; Hemoglobin 10.6; Platelets 224; Potassium 3.7; Sodium 144   Recent Lipid Panel Lab Results  Component Value Date/Time   CHOL 179 02/13/2018 04:23 AM   CHOL 215 (H) 04/06/2015 11:36 AM   TRIG 65 02/13/2018 04:23 AM   HDL 63 02/13/2018 04:23 AM   HDL 63 04/06/2015 11:36 AM   CHOLHDL 2.8 02/13/2018 04:23 AM   LDLCALC 103 (H) 02/13/2018 04:23 AM   LDLCALC 117 (H) 09/05/2017 08:03 AM    Wt Readings from Last 3 Encounters:  09/09/18 166 lb (75.3 kg)  09/07/18 165 lb 11.2 oz (75.2 kg)  07/28/18 165 lb (74.8 kg)     Objective:    Vital Signs:  BP 117/62   Pulse (!) 58   Ht 5\' 1"  (1.549 m)   Wt 166 lb (75.3 kg)   BMI 31.37 kg/m     CONSTITUTIONAL:  Well nourished, well developed female in no acute distress.  EYES: anicteric MOUTH: oral mucosa is pink RESPIRATORY: Normal respiratory effort, symmetric expansion CARDIOVASCULAR: No peripheral edema SKIN: No rash, lesions or ulcers MUSCULOSKELETAL: no digital cyanosis NEURO: Cranial Nerves II-XII grossly intact, moves all extremities PSYCH: Intact judgement and insight.  A&O x 3, Mood/affect appropriate   ASSESSMENT & PLAN:    1.  OSA - The patient is tolerating PAP therapy well without any problems. The PAP download was reviewed today and showed an AHI of 1.3/hr on 10 cm H2O with 73% compliance in using more than 4 hours nightly.  The patient has been using and benefiting from PAP use and will continue to benefit from therapy.   2.  Chronic diastolic CHF - she has not had any LE edema or SOB.  Continue Lasix 20mg  daily    3.  HTN - BP is controlled.  Continue Carvedilol 12.5mg  BID, Hydralazine 25mg  TID, Losartan 50mg  daily  4.  CKD stage 3 - followed by PCP.  Creatinine stable at 1.36 in July 2020.   5.  Persistent atrial fibrillation - Her HR is well controlled.  She will continue on Apixaban 5mg  BID and Carvedilol. Hbg stable at 10.6 and creatinine 1.36.     COVID-19 Education: The signs and symptoms of COVID-19 were discussed with the patient and how to seek care for testing (follow up with PCP or arrange E-visit).  The importance of social distancing was discussed today.  Patient Risk:   After full review of this patient's clinical status, I feel that they are at least moderate risk at this time.  Time:   Today, I have spent 20 minutes directly with the patient on telemedicine discussing medical problems including CHF, OSA, HTN.  We also reviewed the symptoms of COVID 19 and the ways to protect against contracting the virus with telehealth technology.  I spent an additional 5 minutes  reviewing patient's chart including labs.  Medication Adjustments/Labs and  Tests Ordered: Current medicines are reviewed at length with the patient today.  Concerns regarding medicines are outlined above.  Tests Ordered: No orders of the defined types were placed in this encounter.  Medication Changes: No orders of the defined types were placed in this encounter.   Disposition:  Follow up with me in 6 months  Signed, Fransico Him, MD  09/09/2018 9:24 AM    [place]

## 2018-09-08 NOTE — Telephone Encounter (Signed)
Called pt and spoke with pt about meds.

## 2018-09-09 ENCOUNTER — Telehealth (INDEPENDENT_AMBULATORY_CARE_PROVIDER_SITE_OTHER): Payer: PPO | Admitting: Cardiology

## 2018-09-09 ENCOUNTER — Telehealth: Payer: Self-pay | Admitting: Cardiology

## 2018-09-09 ENCOUNTER — Other Ambulatory Visit: Payer: Self-pay

## 2018-09-09 VITALS — BP 117/62 | HR 58 | Ht 61.0 in | Wt 166.0 lb

## 2018-09-09 DIAGNOSIS — G4733 Obstructive sleep apnea (adult) (pediatric): Secondary | ICD-10-CM | POA: Diagnosis not present

## 2018-09-09 DIAGNOSIS — I110 Hypertensive heart disease with heart failure: Secondary | ICD-10-CM

## 2018-09-09 DIAGNOSIS — N183 Chronic kidney disease, stage 3 unspecified: Secondary | ICD-10-CM

## 2018-09-09 DIAGNOSIS — I4819 Other persistent atrial fibrillation: Secondary | ICD-10-CM | POA: Diagnosis not present

## 2018-09-09 DIAGNOSIS — I5032 Chronic diastolic (congestive) heart failure: Secondary | ICD-10-CM | POA: Diagnosis not present

## 2018-09-09 NOTE — Telephone Encounter (Signed)
I spoke with the patient and notified her of Dr. Theodosia Blender recommendations.

## 2018-09-09 NOTE — Telephone Encounter (Signed)
Needs to see her PCP.  

## 2018-09-09 NOTE — Patient Instructions (Signed)
Medication Instructions:  Your physician recommends that you continue on your current medications as directed. Please refer to the Current Medication list given to you today.  If you need a refill on your cardiac medications before your next appointment, please call your pharmacy.   Lab work: none If you have labs (blood work) drawn today and your tests are completely normal, you will receive your results only by: Marland Kitchen MyChart Message (if you have MyChart) OR . A paper copy in the mail If you have any lab test that is abnormal or we need to change your treatment, we will call you to review the results.  Testing/Procedures: none  Follow-Up: At [facility], you and your health needs are our priority.  As part of our continuing mission to provide you with exceptional heart care, we have created designated Provider Care Teams.  These Care Teams include your primary Cardiologist (physician) and Advanced Practice Providers (APPs -  Physician Assistants and Nurse Practitioners) who all work together to provide you with the care you need, when you need it. You will need a follow up appointment in 6 months.(virtual appointment)  Please call our office 2 months in advance to schedule this appointment.  You may see Fransico Him, MD or one of the following Advanced Practice Providers on your designated Care Team:   [place], PA-C Melina Copa, PA-C . Ermalinda Barrios, PA-C  Any Other Special Instructions Will Be Listed Below (If Applicable).

## 2018-09-09 NOTE — Telephone Encounter (Signed)
Left message to call office

## 2018-09-09 NOTE — Telephone Encounter (Signed)
Patient had a telemedicine visit this morning with Dr. Radford Pax. She forgot to mention that she has been experiencing some numbness and tingling in her fingertips. This sensation is constant. She would like to know what Dr. Radford Pax would suggest to alleviate this feeling

## 2018-09-10 ENCOUNTER — Ambulatory Visit: Payer: PPO | Admitting: Nurse Practitioner

## 2018-09-18 ENCOUNTER — Ambulatory Visit: Payer: PPO | Admitting: Nurse Practitioner

## 2018-09-18 ENCOUNTER — Other Ambulatory Visit: Payer: Self-pay | Admitting: Nurse Practitioner

## 2018-09-21 ENCOUNTER — Ambulatory Visit: Payer: PPO | Admitting: Nurse Practitioner

## 2018-10-01 ENCOUNTER — Other Ambulatory Visit: Payer: Self-pay | Admitting: Nurse Practitioner

## 2018-10-01 DIAGNOSIS — R2681 Unsteadiness on feet: Secondary | ICD-10-CM | POA: Diagnosis not present

## 2018-10-01 DIAGNOSIS — E669 Obesity, unspecified: Secondary | ICD-10-CM | POA: Diagnosis not present

## 2018-10-09 DIAGNOSIS — Z79891 Long term (current) use of opiate analgesic: Secondary | ICD-10-CM | POA: Diagnosis not present

## 2018-10-09 DIAGNOSIS — I69354 Hemiplegia and hemiparesis following cerebral infarction affecting left non-dominant side: Secondary | ICD-10-CM | POA: Diagnosis not present

## 2018-10-09 DIAGNOSIS — F329 Major depressive disorder, single episode, unspecified: Secondary | ICD-10-CM | POA: Diagnosis not present

## 2018-10-09 DIAGNOSIS — M150 Primary generalized (osteo)arthritis: Secondary | ICD-10-CM | POA: Diagnosis not present

## 2018-10-09 DIAGNOSIS — I509 Heart failure, unspecified: Secondary | ICD-10-CM | POA: Diagnosis not present

## 2018-10-09 DIAGNOSIS — Z9181 History of falling: Secondary | ICD-10-CM | POA: Diagnosis not present

## 2018-10-09 DIAGNOSIS — F419 Anxiety disorder, unspecified: Secondary | ICD-10-CM | POA: Diagnosis not present

## 2018-10-09 DIAGNOSIS — M545 Low back pain: Secondary | ICD-10-CM | POA: Diagnosis not present

## 2018-10-09 DIAGNOSIS — I110 Hypertensive heart disease with heart failure: Secondary | ICD-10-CM | POA: Diagnosis not present

## 2018-10-09 DIAGNOSIS — Z7901 Long term (current) use of anticoagulants: Secondary | ICD-10-CM | POA: Diagnosis not present

## 2018-10-20 ENCOUNTER — Other Ambulatory Visit: Payer: Self-pay | Admitting: Internal Medicine

## 2018-10-20 ENCOUNTER — Other Ambulatory Visit: Payer: Self-pay

## 2018-10-20 ENCOUNTER — Ambulatory Visit
Admission: RE | Admit: 2018-10-20 | Discharge: 2018-10-20 | Disposition: A | Discharge status: Home / Self Care | Payer: PPO | Source: Ambulatory Visit | Attending: Internal Medicine | Admitting: Internal Medicine

## 2018-10-20 DIAGNOSIS — M545 Low back pain, unspecified: Secondary | ICD-10-CM

## 2018-10-20 DIAGNOSIS — M549 Dorsalgia, unspecified: Secondary | ICD-10-CM

## 2018-10-20 DIAGNOSIS — S299XXA Unspecified injury of thorax, initial encounter: Secondary | ICD-10-CM | POA: Diagnosis not present

## 2018-10-20 DIAGNOSIS — I872 Venous insufficiency (chronic) (peripheral): Secondary | ICD-10-CM

## 2018-10-22 ENCOUNTER — Ambulatory Visit (HOSPITAL_COMMUNITY): Payer: PPO

## 2018-10-22 ENCOUNTER — Ambulatory Visit: Payer: PPO | Admitting: Vascular Surgery

## 2018-10-28 DIAGNOSIS — F419 Anxiety disorder, unspecified: Secondary | ICD-10-CM | POA: Diagnosis not present

## 2018-10-28 DIAGNOSIS — Z79891 Long term (current) use of opiate analgesic: Secondary | ICD-10-CM | POA: Diagnosis not present

## 2018-10-28 DIAGNOSIS — I509 Heart failure, unspecified: Secondary | ICD-10-CM | POA: Diagnosis not present

## 2018-10-28 DIAGNOSIS — I69354 Hemiplegia and hemiparesis following cerebral infarction affecting left non-dominant side: Secondary | ICD-10-CM | POA: Diagnosis not present

## 2018-10-28 DIAGNOSIS — F329 Major depressive disorder, single episode, unspecified: Secondary | ICD-10-CM | POA: Diagnosis not present

## 2018-10-28 DIAGNOSIS — M150 Primary generalized (osteo)arthritis: Secondary | ICD-10-CM | POA: Diagnosis not present

## 2018-10-28 DIAGNOSIS — Z7901 Long term (current) use of anticoagulants: Secondary | ICD-10-CM | POA: Diagnosis not present

## 2018-10-28 DIAGNOSIS — I110 Hypertensive heart disease with heart failure: Secondary | ICD-10-CM | POA: Diagnosis not present

## 2018-10-28 DIAGNOSIS — M545 Low back pain: Secondary | ICD-10-CM | POA: Diagnosis not present

## 2018-10-28 DIAGNOSIS — Z9181 History of falling: Secondary | ICD-10-CM | POA: Diagnosis not present

## 2018-10-29 ENCOUNTER — Other Ambulatory Visit: Payer: Self-pay | Admitting: Nurse Practitioner

## 2018-10-29 DIAGNOSIS — F0150 Vascular dementia without behavioral disturbance: Secondary | ICD-10-CM

## 2018-11-02 ENCOUNTER — Other Ambulatory Visit: Payer: Self-pay | Admitting: Nurse Practitioner

## 2018-11-02 DIAGNOSIS — F0150 Vascular dementia without behavioral disturbance: Secondary | ICD-10-CM

## 2018-11-09 DIAGNOSIS — F0150 Vascular dementia without behavioral disturbance: Secondary | ICD-10-CM | POA: Diagnosis not present

## 2018-11-09 DIAGNOSIS — F339 Major depressive disorder, recurrent, unspecified: Secondary | ICD-10-CM | POA: Diagnosis not present

## 2018-11-09 DIAGNOSIS — F419 Anxiety disorder, unspecified: Secondary | ICD-10-CM | POA: Diagnosis not present

## 2018-11-09 DIAGNOSIS — G4700 Insomnia, unspecified: Secondary | ICD-10-CM | POA: Diagnosis not present

## 2018-11-09 DIAGNOSIS — R600 Localized edema: Secondary | ICD-10-CM | POA: Diagnosis not present

## 2018-11-09 DIAGNOSIS — I4891 Unspecified atrial fibrillation: Secondary | ICD-10-CM | POA: Diagnosis not present

## 2018-11-09 DIAGNOSIS — E669 Obesity, unspecified: Secondary | ICD-10-CM | POA: Diagnosis not present

## 2018-11-09 DIAGNOSIS — I700 Atherosclerosis of aorta: Secondary | ICD-10-CM | POA: Diagnosis not present

## 2018-11-09 DIAGNOSIS — Z6831 Body mass index (BMI) 31.0-31.9, adult: Secondary | ICD-10-CM | POA: Diagnosis not present

## 2018-11-09 DIAGNOSIS — N183 Chronic kidney disease, stage 3 unspecified: Secondary | ICD-10-CM | POA: Diagnosis not present

## 2018-11-09 DIAGNOSIS — R202 Paresthesia of skin: Secondary | ICD-10-CM | POA: Diagnosis not present

## 2018-11-09 DIAGNOSIS — Z79899 Other long term (current) drug therapy: Secondary | ICD-10-CM | POA: Diagnosis not present

## 2018-11-10 DIAGNOSIS — Z79891 Long term (current) use of opiate analgesic: Secondary | ICD-10-CM | POA: Diagnosis not present

## 2018-11-10 DIAGNOSIS — F419 Anxiety disorder, unspecified: Secondary | ICD-10-CM | POA: Diagnosis not present

## 2018-11-10 DIAGNOSIS — Z9181 History of falling: Secondary | ICD-10-CM | POA: Diagnosis not present

## 2018-11-10 DIAGNOSIS — M545 Low back pain: Secondary | ICD-10-CM | POA: Diagnosis not present

## 2018-11-10 DIAGNOSIS — I509 Heart failure, unspecified: Secondary | ICD-10-CM | POA: Diagnosis not present

## 2018-11-10 DIAGNOSIS — F329 Major depressive disorder, single episode, unspecified: Secondary | ICD-10-CM | POA: Diagnosis not present

## 2018-11-10 DIAGNOSIS — M150 Primary generalized (osteo)arthritis: Secondary | ICD-10-CM | POA: Diagnosis not present

## 2018-11-10 DIAGNOSIS — I69354 Hemiplegia and hemiparesis following cerebral infarction affecting left non-dominant side: Secondary | ICD-10-CM | POA: Diagnosis not present

## 2018-11-10 DIAGNOSIS — I110 Hypertensive heart disease with heart failure: Secondary | ICD-10-CM | POA: Diagnosis not present

## 2018-11-10 DIAGNOSIS — Z7901 Long term (current) use of anticoagulants: Secondary | ICD-10-CM | POA: Diagnosis not present

## 2018-11-19 ENCOUNTER — Other Ambulatory Visit: Payer: Self-pay | Admitting: Nurse Practitioner

## 2018-11-19 DIAGNOSIS — F32A Depression, unspecified: Secondary | ICD-10-CM

## 2018-11-19 DIAGNOSIS — F329 Major depressive disorder, single episode, unspecified: Secondary | ICD-10-CM

## 2018-11-19 DIAGNOSIS — L03116 Cellulitis of left lower limb: Secondary | ICD-10-CM

## 2018-11-20 ENCOUNTER — Encounter: Payer: Self-pay | Admitting: Family

## 2018-11-20 ENCOUNTER — Ambulatory Visit: Payer: Self-pay

## 2018-11-30 ENCOUNTER — Other Ambulatory Visit: Payer: Self-pay

## 2018-11-30 ENCOUNTER — Inpatient Hospital Stay: Payer: PPO | Attending: Hematology and Oncology

## 2018-11-30 DIAGNOSIS — Z79899 Other long term (current) drug therapy: Secondary | ICD-10-CM | POA: Insufficient documentation

## 2018-11-30 DIAGNOSIS — Z7901 Long term (current) use of anticoagulants: Secondary | ICD-10-CM | POA: Insufficient documentation

## 2018-11-30 DIAGNOSIS — D638 Anemia in other chronic diseases classified elsewhere: Secondary | ICD-10-CM | POA: Insufficient documentation

## 2018-11-30 DIAGNOSIS — D472 Monoclonal gammopathy: Secondary | ICD-10-CM | POA: Diagnosis not present

## 2018-11-30 LAB — CBC WITH DIFFERENTIAL (CANCER CENTER ONLY)
Abs Immature Granulocytes: 0 10*3/uL (ref 0.00–0.07)
Basophils Absolute: 0 10*3/uL (ref 0.0–0.1)
Basophils Relative: 1 %
Eosinophils Absolute: 0.1 10*3/uL (ref 0.0–0.5)
Eosinophils Relative: 3 %
HCT: 34.9 % — ABNORMAL LOW (ref 36.0–46.0)
Hemoglobin: 11.4 g/dL — ABNORMAL LOW (ref 12.0–15.0)
Immature Granulocytes: 0 %
Lymphocytes Relative: 31 %
Lymphs Abs: 0.8 10*3/uL (ref 0.7–4.0)
MCH: 31.1 pg (ref 26.0–34.0)
MCHC: 32.7 g/dL (ref 30.0–36.0)
MCV: 95.4 fL (ref 80.0–100.0)
Monocytes Absolute: 0.4 10*3/uL (ref 0.1–1.0)
Monocytes Relative: 15 %
Neutro Abs: 1.3 10*3/uL — ABNORMAL LOW (ref 1.7–7.7)
Neutrophils Relative %: 50 %
Platelet Count: 218 10*3/uL (ref 150–400)
RBC: 3.66 MIL/uL — ABNORMAL LOW (ref 3.87–5.11)
RDW: 13.2 % (ref 11.5–15.5)
WBC Count: 2.6 10*3/uL — ABNORMAL LOW (ref 4.0–10.5)
nRBC: 0 % (ref 0.0–0.2)

## 2018-11-30 LAB — IRON AND TIBC
Iron: 121 ug/dL (ref 41–142)
Saturation Ratios: 35 % (ref 21–57)
TIBC: 341 ug/dL (ref 236–444)
UIBC: 220 ug/dL (ref 120–384)

## 2018-11-30 LAB — LACTATE DEHYDROGENASE: LDH: 198 U/L — ABNORMAL HIGH (ref 98–192)

## 2018-11-30 LAB — RETICULOCYTES
Immature Retic Fract: 4.2 % (ref 2.3–15.9)
RBC.: 3.67 MIL/uL — ABNORMAL LOW (ref 3.87–5.11)
Retic Count, Absolute: 42.9 10*3/uL (ref 19.0–186.0)
Retic Ct Pct: 1.2 % (ref 0.4–3.1)

## 2018-11-30 LAB — FERRITIN: Ferritin: 20 ng/mL (ref 11–307)

## 2018-12-01 LAB — HEMOGLOBINOPATHY EVALUATION
Hgb A2 Quant: 2.3 % (ref 1.8–3.2)
Hgb A: 97.7 % (ref 96.4–98.8)
Hgb C: 0 %
Hgb F Quant: 0 % (ref 0.0–2.0)
Hgb S Quant: 0 %
Hgb Variant: 0 %

## 2018-12-01 LAB — ERYTHROPOIETIN: Erythropoietin: 16.2 m[IU]/mL (ref 2.6–18.5)

## 2018-12-02 ENCOUNTER — Other Ambulatory Visit: Payer: Self-pay | Admitting: Nurse Practitioner

## 2018-12-02 DIAGNOSIS — F32A Depression, unspecified: Secondary | ICD-10-CM

## 2018-12-02 DIAGNOSIS — F329 Major depressive disorder, single episode, unspecified: Secondary | ICD-10-CM

## 2018-12-02 DIAGNOSIS — L03116 Cellulitis of left lower limb: Secondary | ICD-10-CM

## 2018-12-02 LAB — MULTIPLE MYELOMA PANEL, SERUM
Albumin SerPl Elph-Mcnc: 3.9 g/dL (ref 2.9–4.4)
Albumin/Glob SerPl: 1.4 (ref 0.7–1.7)
Alpha 1: 0.2 g/dL (ref 0.0–0.4)
Alpha2 Glob SerPl Elph-Mcnc: 0.6 g/dL (ref 0.4–1.0)
B-Globulin SerPl Elph-Mcnc: 1 g/dL (ref 0.7–1.3)
Gamma Glob SerPl Elph-Mcnc: 1.2 g/dL (ref 0.4–1.8)
Globulin, Total: 3 g/dL (ref 2.2–3.9)
IgA: 149 mg/dL (ref 64–422)
IgG (Immunoglobin G), Serum: 1170 mg/dL (ref 586–1602)
IgM (Immunoglobulin M), Srm: 55 mg/dL (ref 26–217)
M Protein SerPl Elph-Mcnc: 0.4 g/dL — ABNORMAL HIGH
Total Protein ELP: 6.9 g/dL (ref 6.0–8.5)

## 2018-12-06 LAB — ANTINUCLEAR ANTIBODIES, IFA: ANA Ab, IFA: NEGATIVE

## 2018-12-06 NOTE — Progress Notes (Signed)
Patient Care Team: Sonia Side., FNP as PCP - General (Family Medicine) Sueanne Margarita, MD as PCP - Cardiology (Cardiology)  DIAGNOSIS:    ICD-10-CM   1. Anemia of chronic disease  D63.8   2. MGUS (monoclonal gammopathy of unknown significance)  D47.2     CHIEF COMPLIANT: Follow-up of anemia and leukopenia  INTERVAL HISTORY: Susan [name] is a 78 y.o. with above-mentioned history of anemia and leukopenia. Labs on 11/30/18 showed: Hg 11.4, HCT 34.9, WBC 2.6, ANC 1.3, retic count 1.2%, iron saturation 35%, ferritin 20, erythropoietin 16.2, ANA negative, LDH 198, m-protein 0.4. She presents to the clinic today to review her labs.   REVIEW OF SYSTEMS:   Constitutional: Denies fevers, chills or abnormal weight loss Eyes: Denies blurriness of vision Ears, nose, mouth, throat, and face: Denies mucositis or sore throat Respiratory: Denies cough, dyspnea or wheezes Cardiovascular: Denies palpitation, chest discomfort Gastrointestinal: Denies nausea, heartburn or change in bowel habits Skin: Denies abnormal skin rashes Lymphatics: Denies new lymphadenopathy or easy bruising Neurological: Denies numbness, tingling or new weaknesses Behavioral/Psych: Mood is stable, no new changes  Extremities: No lower extremity edema Breast: denies any pain or lumps or nodules in either breasts All other systems were reviewed with the patient and are negative.  I have reviewed the past medical history, past surgical history, social history and family history with the patient and they are unchanged from previous note.  ALLERGIES:  is allergic to ace inhibitors; lipitor [atorvastatin]; simvastatin; and latex.  MEDICATIONS:  Current Outpatient Medications  Medication Sig Dispense Refill  . acetaminophen (TYLENOL) 500 MG tablet Take 2 tablets (1,000 mg total) by mouth as needed. 30 tablet 2  . apixaban (ELIQUIS) 5 MG TABS tablet Take 1 tablet (5 mg total) by mouth every 12 (twelve) hours. 60 tablet 4   . Apple Cid Vn-Grn Tea-Bit Or-Cr (APPLE CIDER VINEGAR PLUS PO) Take 1,245 mg by mouth daily.    Marland Kitchen buPROPion (WELLBUTRIN SR) 200 MG 12 hr tablet TAKE 1 TABLET BY MOUTH EVERY DAY 30 tablet 5  . Calcium Carbonate-Vitamin D 600-400 MG-UNIT tablet TAKE 1 TABLET BY MOUTH EVERY DAY 30 tablet 2  . carvedilol (COREG) 12.5 MG tablet TAKE 1 TABLET BY MOUTH 2 TIMES DAILY WITH MEALS 60 tablet 5  . cholecalciferol (VITAMIN D) 25 MCG (1000 UT) tablet TAKE 1 TABLET BY MOUTH EVERY DAY 30 tablet 2  . diclofenac sodium (VOLTAREN) 1 % GEL Apply 2 g topically 4 (four) times daily. 1 Tube 5  . DOK 100 MG capsule TAKE 1 CAPSULE BY MOUTH 2 TIMES DAILY 60 capsule 5  . donepezil (ARICEPT) 10 MG tablet Take 1 tablet (10 mg total) by mouth at bedtime. For memory loss 90 tablet 1  . FLORASTOR 250 MG capsule TAKE 1 CAPSULE BY MOUTH 2 TIMES DAILY 60 capsule 5  . furosemide (LASIX) 20 MG tablet TAKE 1 TABLET BY MOUTH EVERY DAY 90 tablet 0  . hydrALAZINE (APRESOLINE) 25 MG tablet TAKE 1 TABLET BY MOUTH 3 TIMES DAILY 90 tablet 5  . losartan (COZAAR) 50 MG tablet TAKE 1 TABLET BY MOUTH EVERY DAY 30 tablet 3  . Menthol, Topical Analgesic, (BIOFREEZE) 4 % GEL Apply 3 oz topically 3 (three) times daily. For knee pain 1 Tube 3  . Multiple Vitamin (GNP ESSENTIAL ONE DAILY) TABS TAKE 1 TABLET BY MOUTH EVERY DAY 30 tablet 11  . pantoprazole (PROTONIX) 40 MG tablet TAKE 1 TABLET BY MOUTH EVERY DAY 90 tablet 0  .  POLY-IRON 150 150 MG capsule TAKE 1 CAPSULE BY MOUTH EVERY DAY 30 capsule 5  . pravastatin (PRAVACHOL) 40 MG tablet TAKE 1 TABLET BY MOUTH EVERY DAY 30 tablet 5  . sertraline (ZOLOFT) 50 MG tablet Take 1 tablet (50 mg total) by mouth daily. 30 tablet 5  . Turmeric (QC TUMERIC COMPLEX) 500 MG CAPS Take 500 mg by mouth daily.    . vitamin B-12 (CYANOCOBALAMIN) 1000 MCG tablet TAKE 2 TABLETS BY MOUTH EVERY DAY 60 tablet 5   No current facility-administered medications for this visit.     PHYSICAL EXAMINATION: ECOG PERFORMANCE  STATUS: 1 - Symptomatic but completely ambulatory  There were no vitals filed for this visit. There were no vitals filed for this visit.  GENERAL: alert, no distress and comfortable SKIN: skin color, texture, turgor are normal, no rashes or significant lesions EYES: normal, Conjunctiva are pink and non-injected, sclera clear OROPHARYNX: no exudate, no erythema and lips, buccal mucosa, and tongue normal  NECK: supple, thyroid normal size, non-tender, without nodularity LYMPH: no palpable lymphadenopathy in the cervical, axillary or inguinal LUNGS: clear to auscultation and percussion with normal breathing effort HEART: regular rate & rhythm and no murmurs and no lower extremity edema ABDOMEN: abdomen soft, non-tender and normal bowel sounds MUSCULOSKELETAL: no cyanosis of digits and no clubbing  NEURO: alert & oriented x 3 with fluent speech, no focal motor/sensory deficits EXTREMITIES: No lower extremity edema  LABORATORY DATA:  I have reviewed the data as listed CMP Latest Ref Rng & Units 07/31/2018 03/12/2018 03/09/2018  Glucose 65 - 99 mg/dL 98 87 89  BUN 7 - 25 mg/dL 23 26(H) 24  Creatinine 0.60 - 0.88 mg/dL 1.36(H) 1.48(H) 1.41(H)  Sodium 135 - 146 mmol/L 144 143 142  Potassium 3.5 - 5.3 mmol/L 3.7 4.1 3.9  Chloride 98 - 110 mmol/L 110 109 106  CO2 20 - 32 mmol/L '28 27 27  ' Calcium 8.6 - 10.4 mg/dL 10.3 10.1 10.6(H)  Total Protein 6.1 - 8.1 g/dL 6.5 - -  Total Bilirubin 0.2 - 1.2 mg/dL 0.5 - -  Alkaline Phos 38 - 126 U/L - - -  AST 10 - 35 U/L 16 - -  ALT 6 - 29 U/L 11 - -    Lab Results  Component Value Date   WBC 2.6 (L) 11/30/2018   HGB 11.4 (L) 11/30/2018   HCT 34.9 (L) 11/30/2018   MCV 95.4 11/30/2018   PLT 218 11/30/2018   NEUTROABS 1.3 (L) 11/30/2018    ASSESSMENT & PLAN:  Anemia of chronic disease Normocytic anemia for the past 2 years if not longer. Lab review 08/01/2018: Hemoglobin 10.6, WBC 2.8, platelet count 224, ALC 0.7, ANC 1.5  Leukopenia ongoing  since February 2020.  Differential diagnosis: 1. Anemia due to chronic kidney disease and heart disease 2. Hypothyroidism 3. Plasma cell disorders myeloma 4. Bone marrow dysfunction with MDS  Workup performed 11/30/2018: 1. CBC with differential to evaluate the smear: Hemoglobin 11.4, WBC 2.6, ANC 1.3 2.  LDH: 198, absolute reticulocyte count: 42.9: No evidence of hemolysis   3. SPEP: M protein 0.4 g IgG kappa 4. Iron studies: Iron saturation 35%, ferritin 20 5.  Erythropoietin 16.2 6.  ANA: Negative 6.  Hemoglobin electrophoresis: Normal adult hemoglobin Previous work-up with B12 was normal.  Most likely cause of anemia: Anemia of chronic disease  The above results will also show that the patient  has MGUS The most likely cause of the leukopenia is ethnicity related leukopenia.  Since her hemoglobin is above 10 g, we can watch and monitor.  MGUS (monoclonal gammopathy of unknown significance) Work-up for normocytic anemia revealed elevated monoclonal protein: IgG kappa 0.4 g Given the lack of endorgan dysfunction, I suspect the patient has MGUS.  Counseling: I discussed with the patient the spectrum of disorders from MGUS to multiple myeloma. We discussed the role of plasma cells in producing immunoglobulins. We discussed structure of immunoglobulins on how they make up the heavy chains and the light chains. The light chains are Kappa and lambda. I discussed the difference between MGUS and multiple myeloma. MGUS is characterized by elevation monoclonal protein without any end organ damage. Multiple myeloma is associated with elevation monoclonal protein and end organ damage (hypercalcemia, renal dysfunction, anemia, bone lytic lesions ) along with a bone marrow showing greater than 10% plasma cells.  Recommendation: 6 month lab work for follow-up. Bone marrow biopsy if substantial increase in monoclonal protein or other endorgan dysfunction symptoms like renal failure are  severe/worsening anemia or hypercalcemia.    No orders of the defined types were placed in this encounter.  The patient has a good understanding of the overall plan. she agrees with it. she will call with any problems that may develop before the next visit here.  Nicholas Lose, MD 12/07/2018  Julious Oka Dorshimer, am acting as scribe for Dr. Nicholas Lose.  I have reviewed the above documentation for accuracy and completeness, and I agree with the above.

## 2018-12-07 ENCOUNTER — Inpatient Hospital Stay (HOSPITAL_BASED_OUTPATIENT_CLINIC_OR_DEPARTMENT_OTHER): Payer: PPO | Admitting: Hematology and Oncology

## 2018-12-07 ENCOUNTER — Other Ambulatory Visit: Payer: Self-pay

## 2018-12-07 DIAGNOSIS — D472 Monoclonal gammopathy: Secondary | ICD-10-CM

## 2018-12-07 DIAGNOSIS — D638 Anemia in other chronic diseases classified elsewhere: Secondary | ICD-10-CM

## 2018-12-07 NOTE — Assessment & Plan Note (Signed)
Normocytic anemia for the past 2 years if not longer. Lab review 08/01/2018: Hemoglobin 10.6, WBC 2.8, platelet count 224, ALC 0.7, ANC 1.5  Leukopenia ongoing since February 2020.  Differential diagnosis: 1. Anemia due to chronic kidney disease and heart disease 2. Hypothyroidism 3. Plasma cell disorders myeloma 4. Bone marrow dysfunction with MDS  Workup performed 11/30/2018: 1. CBC with differential to evaluate the smear: Hemoglobin 11.4, WBC 2.6, ANC 1.3 2.  LDH: 198, absolute reticulocyte count: 42.9: No evidence of hemolysis   3. SPEP: M protein 0.4 g IgG kappa 4. Iron studies: Iron saturation 35%, ferritin 20 5.  Erythropoietin 16.2 6.  ANA: Negative 6.  Hemoglobin electrophoresis: Normal adult hemoglobin Previous work-up with B12 was normal.  Most likely cause of anemia: Anemia of chronic disease  The above results will also show that the patient  has MGUS The most likely cause of the leukopenia is ethnicity related leukopenia.  Since her hemoglobin is above 10 g, we can watch and monitor.

## 2018-12-07 NOTE — Assessment & Plan Note (Signed)
Work-up for normocytic anemia revealed elevated monoclonal protein: IgG kappa 0.4 g Given the lack of endorgan dysfunction, I suspect the patient has MGUS.  Counseling: I discussed with the patient the spectrum of disorders from MGUS to multiple myeloma. We discussed the role of plasma cells in producing immunoglobulins. We discussed structure of immunoglobulins on how they make up the heavy chains and the light chains. The light chains are Kappa and lambda. I discussed the difference between MGUS and multiple myeloma. MGUS is characterized by elevation monoclonal protein without any end organ damage. Multiple myeloma is associated with elevation monoclonal protein and end organ damage (hypercalcemia, renal dysfunction, anemia, bone lytic lesions ) along with a bone marrow showing greater than 10% plasma cells.  Recommendation: Annual lab work for follow-up. Bone marrow biopsy if substantial increase in monoclonal protein or other endorgan dysfunction symptoms like renal failure are severe/worsening anemia or hypercalcemia.

## 2018-12-08 ENCOUNTER — Telehealth: Payer: Self-pay | Admitting: Hematology and Oncology

## 2018-12-08 NOTE — Telephone Encounter (Signed)
I left a message regarding schedule  

## 2018-12-10 ENCOUNTER — Telehealth: Payer: Self-pay | Admitting: *Deleted

## 2018-12-10 NOTE — Telephone Encounter (Signed)
She is no longer with practice. It looks like she has established with a new PCP

## 2018-12-10 NOTE — Telephone Encounter (Signed)
Seth Bake with [facility] called and wanted to know if you were still prescribing patient's Poly Iron and Wellbutrin. Wants a refill to do patient's pill packs. Is this ok to refill. Please Advise.

## 2018-12-10 NOTE — Telephone Encounter (Signed)
Pharmacy notified and will contact PCP

## 2018-12-22 DIAGNOSIS — J309 Allergic rhinitis, unspecified: Secondary | ICD-10-CM | POA: Diagnosis not present

## 2018-12-22 DIAGNOSIS — R2681 Unsteadiness on feet: Secondary | ICD-10-CM | POA: Diagnosis not present

## 2018-12-22 DIAGNOSIS — I130 Hypertensive heart and chronic kidney disease with heart failure and stage 1 through stage 4 chronic kidney disease, or unspecified chronic kidney disease: Secondary | ICD-10-CM | POA: Diagnosis not present

## 2018-12-22 DIAGNOSIS — I5032 Chronic diastolic (congestive) heart failure: Secondary | ICD-10-CM | POA: Diagnosis not present

## 2018-12-22 DIAGNOSIS — H9193 Unspecified hearing loss, bilateral: Secondary | ICD-10-CM | POA: Diagnosis not present

## 2018-12-22 DIAGNOSIS — G4733 Obstructive sleep apnea (adult) (pediatric): Secondary | ICD-10-CM | POA: Diagnosis not present

## 2018-12-22 DIAGNOSIS — N183 Chronic kidney disease, stage 3 unspecified: Secondary | ICD-10-CM | POA: Diagnosis not present

## 2018-12-22 DIAGNOSIS — R202 Paresthesia of skin: Secondary | ICD-10-CM | POA: Diagnosis not present

## 2018-12-22 DIAGNOSIS — G8929 Other chronic pain: Secondary | ICD-10-CM | POA: Diagnosis not present

## 2018-12-22 DIAGNOSIS — G4700 Insomnia, unspecified: Secondary | ICD-10-CM | POA: Diagnosis not present

## 2018-12-22 DIAGNOSIS — E669 Obesity, unspecified: Secondary | ICD-10-CM | POA: Diagnosis not present

## 2018-12-22 DIAGNOSIS — M545 Low back pain: Secondary | ICD-10-CM | POA: Diagnosis not present

## 2019-02-02 ENCOUNTER — Encounter: Payer: Self-pay | Admitting: Neurology

## 2019-03-01 ENCOUNTER — Ambulatory Visit: Payer: PPO | Admitting: Neurology

## 2019-03-12 ENCOUNTER — Ambulatory Visit: Payer: PPO | Admitting: Podiatry

## 2019-03-19 ENCOUNTER — Ambulatory Visit (INDEPENDENT_AMBULATORY_CARE_PROVIDER_SITE_OTHER): Payer: Medicare Other | Admitting: Neurology

## 2019-03-19 ENCOUNTER — Encounter: Payer: Self-pay | Admitting: Neurology

## 2019-03-19 ENCOUNTER — Other Ambulatory Visit: Payer: Self-pay

## 2019-03-19 VITALS — BP 158/66 | HR 68 | Ht 61.0 in | Wt 169.0 lb

## 2019-03-19 DIAGNOSIS — R202 Paresthesia of skin: Secondary | ICD-10-CM | POA: Diagnosis not present

## 2019-03-19 DIAGNOSIS — R292 Abnormal reflex: Secondary | ICD-10-CM

## 2019-03-19 NOTE — Progress Notes (Signed)
[facility] Clinic Note - Initial Visit   Date: 03/19/19  Susan [name] MRN: [id number] DOB: November 21, 1940   Dear Viviann Spare, NP:  Thank you for your kin d referral of Susan [name] for consultation of hand paresthesias. Although her history is well known to you, please allow [place] to reiterate it for the purpose of our medical record. The patient was accompanied to the clinic by daughter who also provides collateral information.     History of Present Illness: Susan [name] is a 79 y.o. right-handed female referred for evaluation of bilateral hand tingling.  Starting around early November 2020, she began having tingling over the fingertip on both hands.  Symptoms are worse during the day.  It does not wake her up from sleeping. It involves all of her fingers. She has difficulty with fine motor tasks such as picking up small items and with opening bottles.  No exacerbating or alleviating factors. She denies neck pain, but has known cervical stenosis. She has some numbness of the feet.  She walks with a walker because of right knee pain and imbalance.  She is not diabetic, does not drink alcohol, and has no history of exposure to chemotherapy.   Out-side paper records, electronic medical record, and images have been reviewed where available and summarized as:  Lab Results  Component Value Date   HGBA1C 5.9 (H) 02/13/2018   Lab Results  Component Value Date   VITAMINB12 >2,000 (H) 07/31/2018   Lab Results  Component Value Date   TSH 1.61 02/06/2018   Lab Results  Component Value Date   ESRSEDRATE 19 06/16/2018    Past Medical History:  Diagnosis Date  . Anxiety   . Arthritis    "legs, back" (07/29/2012)  . Cellulitis and abscess of leg 12/29/2017  . Chronic diastolic (congestive) heart failure ([place])   . Chronic lower back pain   . Complication of anesthesia    "I have apnea" (07/29/2012)  . Dementia ([place]) 03/12/2018  . 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 Family history of anesthesia complication    "some wake up during OR; some are hard to wake up; some both" (07/29/2012)  . GERD (gastroesophageal reflux disease)   . 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 ([place])   . History of CVA (cerebrovascular accident) 02/01/2015  . History of stomach ulcers 1980's  . Hyperlipidemia   . Hyperparathyroidism, primary ([place]) 04/22/2012  . Hypertensive heart disease   . Incontinent of urine    wears pads  . Major depressive disorder, recurrent episode, severe ([place]) 11/21/2015  . OSA on CPAP   . Osteoarthritis of right knee   . Pedal edema   . Persistent atrial fibrillation ([facility])    CHADS2VASC score is 7 - on chronic anticoagulation with Apixaban  . Spinal stenosis   . Umbilical hernia    "unrepaired" (07/29/2012)  . Varicose veins    "BLE" (07/29/2012)  . Vascular dementia ([place])   . Venous stasis dermatitis of left lower extremity 04/29/2018    Past Surgical History:  Procedure Laterality Date  . CATARACT EXTRACTION    . CHOLECYSTECTOMY  ~ 2010  . COLONOSCOPY    . IUD REMOVAL  1980's  . PARATHYROIDECTOMY N/A 06/16/2012   Procedure: NECK EXPLORATION AND LEFT SUPERIOR PARATHYROIDECTOMY;  Surgeon: Earnstine Regal, MD;  Location: [facility];  Service: General;  Laterality: N/A;  . 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 TOTAL KNEE  ARTHROPLASTY Right 07/29/2012   Procedure: TOTAL KNEE ARTHROPLASTY;  Surgeon: Ninetta Lights, MD;  Location: [facility];  Service: Orthopedics;  Laterality: Right;     Medications:  Outpatient Encounter Medications as of 03/19/2019  Medication Sig  . acetaminophen (TYLENOL) 500 MG tablet Take 2 tablets (1,000 mg total) by mouth as needed.  Marland Kitchen apixaban (ELIQUIS) 5 MG TABS tablet Take 1 tablet (5 mg total) by mouth every 12 (twelve) hours.  Marland Kitchen buPROPion (WELLBUTRIN SR) 200 MG 12 hr tablet TAKE 1 TABLET BY MOUTH EVERY DAY  . Calcium Carbonate-Vitamin D 600-400 MG-UNIT tablet TAKE 1 TABLET BY  MOUTH EVERY DAY  . carvedilol (COREG) 12.5 MG tablet TAKE 1 TABLET BY MOUTH 2 TIMES DAILY WITH MEALS  . cholecalciferol (VITAMIN D) 25 MCG (1000 UT) tablet TAKE 1 TABLET BY MOUTH EVERY DAY  . diclofenac sodium (VOLTAREN) 1 % GEL Apply 2 g topically 4 (four) times daily.  Marland Kitchen DOK 100 MG capsule TAKE 1 CAPSULE BY MOUTH 2 TIMES DAILY  . donepezil (ARICEPT) 10 MG tablet Take 1 tablet (10 mg total) by mouth at bedtime. For memory loss  . FLORASTOR 250 MG capsule TAKE 1 CAPSULE BY MOUTH 2 TIMES DAILY  . furosemide (LASIX) 20 MG tablet TAKE 1 TABLET BY MOUTH EVERY DAY  . hydrALAZINE (APRESOLINE) 25 MG tablet TAKE 1 TABLET BY MOUTH 3 TIMES DAILY  . losartan (COZAAR) 50 MG tablet TAKE 1 TABLET BY MOUTH EVERY DAY  . Menthol, Topical Analgesic, (BIOFREEZE) 4 % GEL Apply 3 oz topically 3 (three) times daily. For knee pain  . Multiple Vitamin (GNP ESSENTIAL ONE DAILY) TABS TAKE 1 TABLET BY MOUTH EVERY DAY  . pantoprazole (PROTONIX) 40 MG tablet TAKE 1 TABLET BY MOUTH EVERY DAY  . POLY-IRON 150 150 MG capsule TAKE 1 CAPSULE BY MOUTH EVERY DAY  . pravastatin (PRAVACHOL) 40 MG tablet TAKE 1 TABLET BY MOUTH EVERY DAY  . sertraline (ZOLOFT) 50 MG tablet Take 1 tablet (50 mg total) by mouth daily.  . Turmeric (QC TUMERIC COMPLEX) 500 MG CAPS Take 500 mg by mouth daily.  . vitamin B-12 (CYANOCOBALAMIN) 1000 MCG tablet TAKE 2 TABLETS BY MOUTH EVERY DAY  . [DISCONTINUED] Apple Cid Vn-Grn Tea-Bit Or-Cr (APPLE CIDER VINEGAR PLUS PO) Take 1,245 mg by mouth daily.   No facility-administered encounter medications on file as of 03/19/2019.    Allergies:  Allergies  Allergen Reactions  . Ace Inhibitors Cough  . Lipitor [Atorvastatin] Swelling  . Simvastatin Other (See Comments)    Myalgias     . Latex Itching    Family History: Family History  Problem Relation Age of Onset  . Hypertension Mother        deceased  . Stroke Mother   . Breast cancer Mother   . Lung cancer Father        deceased  . Diabetes  Daughter   . Hypertension Daughter     Social History: Social History   Tobacco Use  . Smoking status: Former Smoker    Packs/day: 1.00    Years: 30.00    Pack years: 30.00    Types: Cigarettes    Quit date: 01/07/1978    Years since quitting: 41.2  . Smokeless tobacco: Never Used  Substance Use Topics  . Alcohol use: No  . Drug use: No   Social History   Social History Narrative   Lives at home alone   Right-handed   Caffeine: 1 cup of coffee per day  Diet:      Caffeine:      Married, if yes what year:       Do you live in a house, apartment, assisted living, condo, trailer, ect:      Pets:      Current/Past profession:      Exercise:         Living Will: Yes   DNR: No   POA/HPOA: Yes      Functional Status:   Do you have difficulty bathing or dressing yourself? Yes   Do you have difficulty preparing food or eating? No   Do you have difficulty managing your medications? Yes   Do you have difficulty managing your finances? Yes   Do you have difficulty affording your medications? Yes    Vital Signs:  BP (!) 158/66   Pulse 68   Ht 5\' 1"  (1.549 m)   Wt 169 lb (76.7 kg)   SpO2 98%   BMI 31.93 kg/m   Neurological Exam: MENTAL STATUS including orientation to time, place, person, recent and remote memory, attention span and concentration, language, and fund of knowledge is fairly intact.  Speech is not dysarthric.  CRANIAL NERVES: II:  No visual field defects.   III-IV-VI: Pupils equal round and reactive to light.  Normal conjugate, extra-ocular eye movements in all directions of gaze.  No nystagmus.  No ptosis.    VIII:  Normal hearing and vestibular function.   XI:  Normal shoulder shrug and head rotation.    MOTOR:  No atrophy, fasciculations or abnormal movements.  No pronator drift.   Upper Extremity:  Right  Left  Deltoid  5/5   5/5   Biceps  5/5   5/5   Triceps  5/5   5/5   Infraspinatus 5/5  5/5  Medial pectoralis 5/5  5/5  Wrist  extensors  5/5   5/5   Wrist flexors  5/5   5/5   Finger extensors  5/5   5/5   Finger flexors  5/5   5/5   Dorsal interossei  5/5   5/5   Abductor pollicis  5/5   5/5   Tone (Ashworth scale)  0  0   Lower Extremity:  Right  Left  Hip flexors  5/5   5/5   Hip extensors  5/5   5/5   Adductor 5/5  5/5  Abductor 5/5  5/5  Knee flexors  5/5   5/5   Knee extensors  5/5   5/5   Dorsiflexors  5/5   5/5   Plantarflexors  5/5   5/5   Toe extensors  5/5   5/5   Toe flexors  5/5   5/5   Tone (Ashworth scale)  0  0   MSRs:  Right        Left                  brachioradialis 3+  3+  biceps 3+  3+  triceps 2+  2+  patellar 3+  3+  ankle jerk 0  0  Hoffman no  no  plantar response down  down   SENSORY:  Reduced vibration at the ankles, temperature and pin prick reduced over the feet. Sensation is intact in the hands.  COORDINATION/GAIT: Normal finger-to- nose-finger.  Intact rapid alternating movements bilaterally.  Gait assisted with walker, stable.   IMPRESSION: Bilateral hand paresthesias - DDx - progression of neuropathy, entrapment neuropathy (CTS), vs cervical canal stenosis.  I discussed that NCS/EMG would help differentiate among these etiologies.  She has a very low pain threshold and I am not sure she would be able to complete testing.  Alternatively, MRI cervical spine can be ordered to look for compressive pathology since there is hyperreflexia on exam.  She would like to think about her options and will call the office when she makes a decision.  In the meantime, she can empirically try using wrist splints and see if this helps her symptoms.  If there is improvement, it would suggest carpal tunnel syndrome.     Thank you for allowing me to participate in patient's care.  If I can answer any additional questions, I would be pleased to do so.    Sincerely,    Jannette Cotham K. Posey Pronto, DO

## 2019-03-19 NOTE — Patient Instructions (Signed)
Please call my office if you would like to proceed with nerve testing or MRI cervical spine  You can try using a wrist brace and see if this helps in the meantime

## 2019-03-23 ENCOUNTER — Other Ambulatory Visit: Payer: Self-pay | Admitting: Infectious Disease

## 2019-03-23 ENCOUNTER — Telehealth: Payer: Self-pay

## 2019-03-23 NOTE — Telephone Encounter (Signed)
Patient called office back regarding message. States that she is having flare up again. States she has been using compression hoses and has had no relief.  Patient states she is still on Keflix and has not noticed any change to flare; states that she is still having pain on her knee. Is taking one pill in the AM and one PM. Will forward message to MD to advise on refills. Also if it is okay to schedule appt. [place]

## 2019-03-23 NOTE — Telephone Encounter (Signed)
Received refill request from patient's pharmacy for Keflex. Has not been in office since 06/2018. Left voicemail requesting patient call office back regarding refill. Will need to ask patient if she is having any signs of flare. Medication was d/c will need to forward message to MD to okay refill. [place]

## 2019-03-24 NOTE — Telephone Encounter (Signed)
Ok very good thanks! 

## 2019-03-24 NOTE — Telephone Encounter (Signed)
RN called patient back. Susan [name] reports she has been taking Keflex for 2 weeks without relief. She got her daughter Susan [name] on the line for a 3 way phone call. Per Susan [name], patient is having swelling but does not have an active infection. She will help her mom schedule follow up with Dr Tamala Julian to address the swelling. Susan [name] will stop the keflex. They will call us if needed after this evaluation. Landis Gandy, RN

## 2019-04-12 ENCOUNTER — Other Ambulatory Visit: Payer: Self-pay | Admitting: Family

## 2019-04-12 DIAGNOSIS — Z1231 Encounter for screening mammogram for malignant neoplasm of breast: Secondary | ICD-10-CM

## 2019-04-26 ENCOUNTER — Ambulatory Visit
Admission: RE | Admit: 2019-04-26 | Discharge: 2019-04-26 | Disposition: A | Discharge status: Home / Self Care | Payer: Medicare Other | Source: Ambulatory Visit | Attending: Family | Admitting: Family

## 2019-04-26 ENCOUNTER — Other Ambulatory Visit: Payer: Self-pay

## 2019-04-26 ENCOUNTER — Ambulatory Visit: Payer: Medicare Other

## 2019-04-26 DIAGNOSIS — Z1231 Encounter for screening mammogram for malignant neoplasm of breast: Secondary | ICD-10-CM

## 2019-05-12 ENCOUNTER — Other Ambulatory Visit: Payer: Self-pay | Admitting: *Deleted

## 2019-05-12 DIAGNOSIS — I872 Venous insufficiency (chronic) (peripheral): Secondary | ICD-10-CM

## 2019-05-13 ENCOUNTER — Encounter (HOSPITAL_COMMUNITY): Payer: Medicare Other

## 2019-05-13 ENCOUNTER — Ambulatory Visit: Payer: Medicare Other | Admitting: Vascular Surgery

## 2019-05-25 ENCOUNTER — Telehealth: Payer: Self-pay

## 2019-05-25 NOTE — Telephone Encounter (Signed)
Left message requesting call back about interest in [facility]. Starting new groups in June and wanted to know if she wanted to join.

## 2019-05-27 ENCOUNTER — Inpatient Hospital Stay: Payer: Medicare Other | Attending: Hematology and Oncology

## 2019-05-27 ENCOUNTER — Other Ambulatory Visit: Payer: Self-pay

## 2019-05-27 DIAGNOSIS — D472 Monoclonal gammopathy: Secondary | ICD-10-CM | POA: Diagnosis present

## 2019-05-27 DIAGNOSIS — Z7901 Long term (current) use of anticoagulants: Secondary | ICD-10-CM | POA: Diagnosis not present

## 2019-05-27 DIAGNOSIS — D638 Anemia in other chronic diseases classified elsewhere: Secondary | ICD-10-CM | POA: Insufficient documentation

## 2019-05-27 DIAGNOSIS — Z79899 Other long term (current) drug therapy: Secondary | ICD-10-CM | POA: Insufficient documentation

## 2019-05-27 LAB — CBC WITH DIFFERENTIAL (CANCER CENTER ONLY)
Abs Immature Granulocytes: 0.01 10*3/uL (ref 0.00–0.07)
Basophils Absolute: 0 10*3/uL (ref 0.0–0.1)
Basophils Relative: 2 %
Eosinophils Absolute: 0.1 10*3/uL (ref 0.0–0.5)
Eosinophils Relative: 2 %
HCT: 34.1 % — ABNORMAL LOW (ref 36.0–46.0)
Hemoglobin: 11 g/dL — ABNORMAL LOW (ref 12.0–15.0)
Immature Granulocytes: 0 %
Lymphocytes Relative: 30 %
Lymphs Abs: 0.8 10*3/uL (ref 0.7–4.0)
MCH: 31.3 pg (ref 26.0–34.0)
MCHC: 32.3 g/dL (ref 30.0–36.0)
MCV: 96.9 fL (ref 80.0–100.0)
Monocytes Absolute: 0.4 10*3/uL (ref 0.1–1.0)
Monocytes Relative: 15 %
Neutro Abs: 1.4 10*3/uL — ABNORMAL LOW (ref 1.7–7.7)
Neutrophils Relative %: 51 %
Platelet Count: 204 10*3/uL (ref 150–400)
RBC: 3.52 MIL/uL — ABNORMAL LOW (ref 3.87–5.11)
RDW: 13.3 % (ref 11.5–15.5)
WBC Count: 2.6 10*3/uL — ABNORMAL LOW (ref 4.0–10.5)
nRBC: 0 % (ref 0.0–0.2)

## 2019-05-27 LAB — CMP (CANCER CENTER ONLY)
ALT: 16 U/L (ref 0–44)
AST: 19 U/L (ref 15–41)
Albumin: 3.7 g/dL (ref 3.5–5.0)
Alkaline Phosphatase: 62 U/L (ref 38–126)
Anion gap: 6 (ref 5–15)
BUN: 27 mg/dL — ABNORMAL HIGH (ref 8–23)
CO2: 26 mmol/L (ref 22–32)
Calcium: 10 mg/dL (ref 8.9–10.3)
Chloride: 110 mmol/L (ref 98–111)
Creatinine: 1.49 mg/dL — ABNORMAL HIGH (ref 0.44–1.00)
GFR, Est AFR Am: 38 mL/min — ABNORMAL LOW (ref >60)
GFR, Estimated: 33 mL/min — ABNORMAL LOW (ref >60)
Glucose, Bld: 108 mg/dL — ABNORMAL HIGH (ref 70–99)
Potassium: 3.9 mmol/L (ref 3.5–5.1)
Sodium: 142 mmol/L (ref 135–145)
Total Bilirubin: 0.5 mg/dL (ref 0.3–1.2)
Total Protein: 7 g/dL (ref 6.5–8.1)

## 2019-05-28 LAB — KAPPA/LAMBDA LIGHT CHAINS
Kappa free light chain: 66.7 mg/L — ABNORMAL HIGH (ref 3.3–19.4)
Kappa, lambda light chain ratio: 2.81 — ABNORMAL HIGH (ref 0.26–1.65)
Lambda free light chains: 23.7 mg/L (ref 5.7–26.3)

## 2019-06-02 NOTE — Progress Notes (Signed)
Patient Care Team: Sonia Side., FNP as PCP - General (Family Medicine) Sueanne Margarita, MD as PCP - Cardiology (Cardiology)  DIAGNOSIS:    ICD-10-CM   1. Anemia of chronic disease  D63.8   2. MGUS (monoclonal gammopathy of unknown significance)  D47.2     CHIEF COMPLIANT: Follow-up of anemia and leukopenia  INTERVAL HISTORY: Susan [name] is a 79 y.o. with above-mentioned history of anemia and leukopenia. Labs on 05/27/19 showed: Hg 11.0, HCT 34.1, WBC 2.6, ANC 1.4, creatinine 1.49, kappa lambda light chain ratio 2.81. She presents to the clinic today to review her labs.    ALLERGIES:  is allergic to ace inhibitors; lipitor [atorvastatin]; simvastatin; and latex.  MEDICATIONS:  Current Outpatient Medications  Medication Sig Dispense Refill  . acetaminophen (TYLENOL) 500 MG tablet Take 2 tablets (1,000 mg total) by mouth as needed. 30 tablet 2  . apixaban (ELIQUIS) 5 MG TABS tablet Take 1 tablet (5 mg total) by mouth every 12 (twelve) hours. 60 tablet 4  . buPROPion (WELLBUTRIN SR) 200 MG 12 hr tablet TAKE 1 TABLET BY MOUTH EVERY DAY 30 tablet 5  . Calcium Carbonate-Vitamin D 600-400 MG-UNIT tablet TAKE 1 TABLET BY MOUTH EVERY DAY 30 tablet 2  . carvedilol (COREG) 12.5 MG tablet TAKE 1 TABLET BY MOUTH 2 TIMES DAILY WITH MEALS 60 tablet 5  . cholecalciferol (VITAMIN D) 25 MCG (1000 UT) tablet TAKE 1 TABLET BY MOUTH EVERY DAY 30 tablet 2  . diclofenac sodium (VOLTAREN) 1 % GEL Apply 2 g topically 4 (four) times daily. 1 Tube 5  . DOK 100 MG capsule TAKE 1 CAPSULE BY MOUTH 2 TIMES DAILY 60 capsule 5  . donepezil (ARICEPT) 10 MG tablet Take 1 tablet (10 mg total) by mouth at bedtime. For memory loss 90 tablet 1  . FLORASTOR 250 MG capsule TAKE 1 CAPSULE BY MOUTH 2 TIMES DAILY 60 capsule 5  . furosemide (LASIX) 20 MG tablet TAKE 1 TABLET BY MOUTH EVERY DAY 90 tablet 0  . hydrALAZINE (APRESOLINE) 25 MG tablet TAKE 1 TABLET BY MOUTH 3 TIMES DAILY 90 tablet 5  . losartan (COZAAR) 50 MG  tablet TAKE 1 TABLET BY MOUTH EVERY DAY 30 tablet 3  . Menthol, Topical Analgesic, (BIOFREEZE) 4 % GEL Apply 3 oz topically 3 (three) times daily. For knee pain 1 Tube 3  . Multiple Vitamin (GNP ESSENTIAL ONE DAILY) TABS TAKE 1 TABLET BY MOUTH EVERY DAY 30 tablet 11  . pantoprazole (PROTONIX) 40 MG tablet TAKE 1 TABLET BY MOUTH EVERY DAY 90 tablet 0  . POLY-IRON 150 150 MG capsule TAKE 1 CAPSULE BY MOUTH EVERY DAY 30 capsule 5  . pravastatin (PRAVACHOL) 40 MG tablet TAKE 1 TABLET BY MOUTH EVERY DAY 30 tablet 5  . sertraline (ZOLOFT) 50 MG tablet Take 1 tablet (50 mg total) by mouth daily. 30 tablet 5  . Turmeric (QC TUMERIC COMPLEX) 500 MG CAPS Take 500 mg by mouth daily.    . vitamin B-12 (CYANOCOBALAMIN) 1000 MCG tablet TAKE 2 TABLETS BY MOUTH EVERY DAY 60 tablet 5   No current facility-administered medications for this visit.    PHYSICAL EXAMINATION: ECOG PERFORMANCE STATUS: 1 - Symptomatic but completely ambulatory  There were no vitals filed for this visit. There were no vitals filed for this visit.   LABORATORY DATA:  I have reviewed the data as listed CMP Latest Ref Rng & Units 05/27/2019 07/31/2018 03/12/2018  Glucose 70 - 99 mg/dL 108(H) 98  87  BUN 8 - 23 mg/dL 27(H) 23 26(H)  Creatinine 0.44 - 1.00 mg/dL 1.49(H) 1.36(H) 1.48(H)  Sodium 135 - 145 mmol/L 142 144 143  Potassium 3.5 - 5.1 mmol/L 3.9 3.7 4.1  Chloride 98 - 111 mmol/L 110 110 109  CO2 22 - 32 mmol/L 26 28 27   Calcium 8.9 - 10.3 mg/dL 10.0 10.3 10.1  Total Protein 6.5 - 8.1 g/dL 7.0 6.5 -  Total Bilirubin 0.3 - 1.2 mg/dL 0.5 0.5 -  Alkaline Phos 38 - 126 U/L 62 - -  AST 15 - 41 U/L 19 16 -  ALT 0 - 44 U/L 16 11 -    Lab Results  Component Value Date   WBC 2.6 (L) 05/27/2019   HGB 11.0 (L) 05/27/2019   HCT 34.1 (L) 05/27/2019   MCV 96.9 05/27/2019   PLT 204 05/27/2019   NEUTROABS 1.4 (L) 05/27/2019    ASSESSMENT & PLAN:  Anemia of chronic disease Workup performed 11/30/2018: 1. CBC with differential :  Hemoglobin 11.4, WBC 2.6, ANC 1.3 2.  LDH: 198, absolute reticulocyte count: 42.9: No evidence of hemolysis   3.SPEP: M protein 0.4 g IgG kappa 4. Ironstudies: Iron saturation 35%, ferritin 20 5.  Erythropoietin 16.2 6.  ANA: Negative 6.  Hemoglobin electrophoresis: Normal adult hemoglobin Previous work-up with B12 was normal.  Diagnosis: Anemia of chronic disease Current treatment: Watchful monitoring since that hemoglobin is about 10. Lab review 05/27/2019: Hemoglobin 11, MCV 96.9, WBC 2.6, ANC 1.4 I discussed with the patient that the hemoglobin and the white count are stable.  MGUS (monoclonal gammopathy of unknown significance) Work-up for normocytic anemia revealed elevated monoclonal protein:  November 2020: IgG kappa 0.4 g May 2021:Kappa 66.7, ratio 2.81, M protein: Not done  Return to clinic in 6 months with labs done ahead of time and a MyChart virtual visit.  After that we can do once a year No orders of the defined types were placed in this encounter.  The patient has a good understanding of the overall plan. she agrees with it. she will call with any problems that may develop before the next visit here.  Total time spent: 20 mins including face to face time and time spent for planning, charting and coordination of care  Nicholas Lose, MD 06/03/2019  I, Cloyde Reams Dorshimer, am acting as scribe for Dr. Nicholas Lose.  I have reviewed the above documentation for accuracy and completeness, and I agree with the above.

## 2019-06-03 ENCOUNTER — Inpatient Hospital Stay (HOSPITAL_BASED_OUTPATIENT_CLINIC_OR_DEPARTMENT_OTHER): Payer: Medicare Other | Admitting: Hematology and Oncology

## 2019-06-03 ENCOUNTER — Other Ambulatory Visit: Payer: Self-pay

## 2019-06-03 DIAGNOSIS — D472 Monoclonal gammopathy: Secondary | ICD-10-CM | POA: Diagnosis not present

## 2019-06-03 DIAGNOSIS — D638 Anemia in other chronic diseases classified elsewhere: Secondary | ICD-10-CM | POA: Diagnosis not present

## 2019-06-03 NOTE — Assessment & Plan Note (Signed)
Work-up for normocytic anemia revealed elevated monoclonal protein:  November 2020: IgG kappa 0.4 g May 2021:Kappa 66.7, ratio 2.81, M protein

## 2019-06-03 NOTE — Assessment & Plan Note (Addendum)
Workup performed 11/30/2018: 1. CBC with differential : Hemoglobin 11.4, WBC 2.6, ANC 1.3 2.  LDH: 198, absolute reticulocyte count: 42.9: No evidence of hemolysis   3.SPEP: M protein 0.4 g IgG kappa 4. Ironstudies: Iron saturation 35%, ferritin 20 5.  Erythropoietin 16.2 6.  ANA: Negative 6.  Hemoglobin electrophoresis: Normal adult hemoglobin Previous work-up with B12 was normal.  Diagnosis: Anemia of chronic disease Current treatment: Watchful monitoring since that hemoglobin is about 10. Lab review 05/27/2019: Hemoglobin 11, MCV 96.9, WBC 2.6, ANC 1.4 I discussed with the patient that the hemoglobin and the white count are stable.

## 2019-06-09 ENCOUNTER — Telehealth: Payer: Self-pay | Admitting: Hematology and Oncology

## 2019-06-09 NOTE — Telephone Encounter (Signed)
Scheduled per 05/27 los, called patient and patient is notified.

## 2019-06-10 ENCOUNTER — Ambulatory Visit (INDEPENDENT_AMBULATORY_CARE_PROVIDER_SITE_OTHER): Payer: Medicare Other | Admitting: Vascular Surgery

## 2019-06-10 ENCOUNTER — Encounter: Payer: Self-pay | Admitting: Vascular Surgery

## 2019-06-10 ENCOUNTER — Ambulatory Visit (HOSPITAL_COMMUNITY)
Admission: RE | Admit: 2019-06-10 | Discharge: 2019-06-10 | Disposition: A | Discharge status: Home / Self Care | Payer: Medicare Other | Source: Ambulatory Visit | Attending: Vascular Surgery | Admitting: Vascular Surgery

## 2019-06-10 ENCOUNTER — Other Ambulatory Visit: Payer: Self-pay

## 2019-06-10 VITALS — BP 132/71 | HR 54 | Temp 97.5°F | Resp 14 | Ht 62.0 in | Wt 163.0 lb

## 2019-06-10 DIAGNOSIS — I872 Venous insufficiency (chronic) (peripheral): Secondary | ICD-10-CM

## 2019-06-10 NOTE — Progress Notes (Signed)
REASON FOR CONSULT:    Follow-up of venous disease.  ASSESSMENT & PLAN:   CHRONIC VENOUS INSUFFICIENCY: This patient has CEAP C4a venous disease.  She has failed conservative treatment including leg elevation, thigh-high compression stockings.  I think she would be a good candidate for staged laser ablation of the great saphenous veins and also the right small saphenous vein.  Given that she is having significant knee pain on the left and wishes to undergo knee replacement I think it would make sense to address the incompetent left great saphenous vein first and then allow her to undergo knee replacement.  We can address the right leg once she has recovered from her knee surgery.  The left side is a more pressing issue.  I have encouraged her to continue to elevate her legs and wear her compression stockings.  She is on Eliquis because of her previous stroke and we can hold this for 48 hours prior to the procedure.  I have discussed the indications for laser ablation and potential complications including but not limited to 1% risk of DVT or 1% risk of nonclosure.  All of her questions were answered and she is agreeable to proceed.   Susan Mayo, MD Office: [phone]   HPI:   Susan [name] is a pleasant 79 y.o. female, who I saw in consultation on 07/09/2018 with chronic venous insufficiency and also peripheral vascular disease.  At that time the patient had CEAP C4 venous disease.  She had deep venous reflux and also significant superficial venous reflux in bilateral great saphenous veins.  We discussed conservative measures including the importance of intermittent leg elevation and the proper positioning for this.  I prescribed thigh-high compression stockings with a gradient of 20 to 30 mmHg.  I felt if she continued to have significant symptoms she would be potentially a candidate for laser ablation of the great saphenous veins, and also potentially the right small saphenous vein.  On exam  she did have some evidence of infrainguinal arterial occlusive disease bilaterally.  She comes in for a follow-up visit.  Since I saw her last, she continues to have significant aching pain and heaviness in both legs which is aggravated by standing and relieved with elevation.  Her symptoms are worse at the end of the day.  Compression stockings helped some but she still having significant symptoms.  She has been wearing thigh-high compression stockings with a gradient of 20 to 30 mmHg.  She has significant arthritis in her left knee and is being considered for a knee replacement.  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  She had significant swelling especially on the left side and orthopedics was reluctant to consider knee replacement until the swelling was better.  The patient states that swelling has improved.  Her venous symptoms are more significant on the left side but she has symptoms on both sides.  She has had no previous history of DVT and no previous venous procedures.  I do not get any history of claudication although her activity is very limited.  I do not get any history of rest rest pain.  She is on Eliquis because of a history of a CVA.  Past Medical History:  Diagnosis Date  . Anxiety   . Arthritis    "legs, back" (07/29/2012)  . Cellulitis and abscess of leg 12/29/2017  . Chronic diastolic (congestive) heart failure ([place])   . Chronic lower back pain   . Complication of anesthesia    "  I have apnea" (07/29/2012)  . Dementia ([place]) 03/12/2018  . 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 Family history of anesthesia complication    "some wake up during OR; some are hard to wake up; some both" (07/29/2012)  . GERD (gastroesophageal reflux disease)   . 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 History of CVA (cerebrovascular accident) 02/01/2015  . History of stomach ulcers 1980's  . Hyperlipidemia   . Hyperparathyroidism, primary ([place])  04/22/2012  . Hypertensive heart disease   . Incontinent of urine    wears pads  . Major depressive disorder, recurrent episode, severe ([place]) 11/21/2015  . OSA on CPAP   . Osteoarthritis of right knee   . Pedal edema   . Persistent atrial fibrillation ([facility])    CHADS2VASC score is 7 - on chronic anticoagulation with Apixaban  . Spinal stenosis   . Umbilical hernia    "unrepaired" (07/29/2012)  . Varicose veins    "BLE" (07/29/2012)  . Vascular dementia ([place])   . Venous stasis dermatitis of left lower extremity 04/29/2018    Family History  Problem Relation Age of Onset  . Hypertension Mother        deceased  . Stroke Mother   . Breast cancer Mother   . Lung cancer Father        deceased  . Diabetes Daughter   . Hypertension Daughter     SOCIAL HISTORY: Social History   Socioeconomic History  . Marital status: Widowed    Spouse name: Not on file  . Number of children: Not on file  . Years of education: Not on file  . Highest education level: Not on file  Occupational History  . Occupation: retired    Fish farm manager: RETIRED  Tobacco Use  . Smoking status: Former Smoker    Packs/day: 1.00    Years: 30.00    Pack years: 30.00    Types: Cigarettes    Quit date: 01/07/1978    Years since quitting: 41.4  . Smokeless tobacco: Never Used  Substance and Sexual Activity  . Alcohol use: No  . Drug use: No  . Sexual activity: Not Currently    Comment: intercourse age 22,less than 5 secxual partners,  Other Topics Concern  . Not on file  Social History Narrative   Lives at home alone   Right-handed   Caffeine: 1 cup of coffee per day         Diet:      Caffeine:      Married, if yes what year:       Do you live in a house, apartment, assisted living, condo, trailer, ect:      Pets:      Current/Past profession:      Exercise:         Living Will: Yes   DNR: No   POA/HPOA: Yes      Functional Status:   Do you have difficulty bathing or dressing yourself? Yes    Do you have difficulty preparing food or eating? No   Do you have difficulty managing your medications? Yes   Do you have difficulty managing your finances? Yes   Do you have difficulty affording your medications? Yes   Social Determinants of Health   Financial Resource Strain:   . Difficulty of Paying Living Expenses:   Food Insecurity:   . Worried About Charity fundraiser in the Last Year:   . Ran  Out of Food in the Last Year:   Transportation Needs:   . Lack of Transportation (Medical):   Marland Kitchen Lack of Transportation (Non-Medical):   Physical Activity:   . Days of Exercise per Week:   . Minutes of Exercise per Session:   Stress:   . Feeling of Stress :   Social Connections:   . Frequency of Communication with Friends and Family:   . Frequency of Social Gatherings with Friends and Family:   . Attends Religious Services:   . Active Member of Clubs or Organizations:   . Attends Archivist Meetings:   Marland Kitchen Marital Status:   Intimate Partner Violence:   . Fear of Current or Ex-Partner:   . Emotionally Abused:   Marland Kitchen Physically Abused:   . Sexually Abused:     Allergies  Allergen Reactions  . Ace Inhibitors Cough  . Lipitor [Atorvastatin] Swelling  . Simvastatin Other (See Comments)    Myalgias     . Latex Itching    Current Outpatient Medications  Medication Sig Dispense Refill  . acetaminophen (TYLENOL) 500 MG tablet Take 2 tablets (1,000 mg total) by mouth as needed. 30 tablet 2  . apixaban (ELIQUIS) 5 MG TABS tablet Take 1 tablet (5 mg total) by mouth every 12 (twelve) hours. 60 tablet 4  . buPROPion (WELLBUTRIN SR) 200 MG 12 hr tablet TAKE 1 TABLET BY MOUTH EVERY DAY 30 tablet 5  . Calcium Carbonate-Vitamin D 600-400 MG-UNIT tablet TAKE 1 TABLET BY MOUTH EVERY DAY 30 tablet 2  . carvedilol (COREG) 12.5 MG tablet TAKE 1 TABLET BY MOUTH 2 TIMES DAILY WITH MEALS 60 tablet 5  . cholecalciferol (VITAMIN D) 25 MCG (1000 UT) tablet TAKE 1 TABLET BY MOUTH EVERY DAY  30 tablet 2  . diclofenac sodium (VOLTAREN) 1 % GEL Apply 2 g topically 4 (four) times daily. 1 Tube 5  . DOK 100 MG capsule TAKE 1 CAPSULE BY MOUTH 2 TIMES DAILY 60 capsule 5  . donepezil (ARICEPT) 10 MG tablet Take 1 tablet (10 mg total) by mouth at bedtime. For memory loss 90 tablet 1  . FLORASTOR 250 MG capsule TAKE 1 CAPSULE BY MOUTH 2 TIMES DAILY 60 capsule 5  . furosemide (LASIX) 20 MG tablet TAKE 1 TABLET BY MOUTH EVERY DAY 90 tablet 0  . hydrALAZINE (APRESOLINE) 25 MG tablet TAKE 1 TABLET BY MOUTH 3 TIMES DAILY 90 tablet 5  . losartan (COZAAR) 50 MG tablet TAKE 1 TABLET BY MOUTH EVERY DAY 30 tablet 3  . Menthol, Topical Analgesic, (BIOFREEZE) 4 % GEL Apply 3 oz topically 3 (three) times daily. For knee pain 1 Tube 3  . Multiple Vitamin (GNP ESSENTIAL ONE DAILY) TABS TAKE 1 TABLET BY MOUTH EVERY DAY 30 tablet 11  . pantoprazole (PROTONIX) 40 MG tablet TAKE 1 TABLET BY MOUTH EVERY DAY 90 tablet 0  . POLY-IRON 150 150 MG capsule TAKE 1 CAPSULE BY MOUTH EVERY DAY 30 capsule 5  . pravastatin (PRAVACHOL) 40 MG tablet TAKE 1 TABLET BY MOUTH EVERY DAY 30 tablet 5  . sertraline (ZOLOFT) 50 MG tablet Take 1 tablet (50 mg total) by mouth daily. 30 tablet 5  . Turmeric (QC TUMERIC COMPLEX) 500 MG CAPS Take 500 mg by mouth daily.    . vitamin B-12 (CYANOCOBALAMIN) 1000 MCG tablet TAKE 2 TABLETS BY MOUTH EVERY DAY 60 tablet 5   No current facility-administered medications for this visit.    REVIEW OF SYSTEMS:  [X]  denotes positive finding, [ ]   denotes negative finding Cardiac  Comments:  Chest pain or chest pressure:    Shortness of breath upon exertion:    Short of breath when lying flat:    Irregular heart rhythm:        Vascular    Pain in calf, thigh, or hip brought on by ambulation:    Pain in feet at night that wakes you up from your sleep:     Blood clot in your veins:    Leg swelling:  x       Pulmonary    Oxygen at home:    Productive cough:     Wheezing:         Neurologic     Sudden weakness in arms or legs:     Sudden numbness in arms or legs:     Sudden onset of difficulty speaking or slurred speech:    Temporary loss of vision in one eye:     Problems with dizziness:         Gastrointestinal    Blood in stool:     Vomited blood:         Genitourinary    Burning when urinating:     Blood in urine:        Psychiatric    Major depression:         Hematologic    Bleeding problems:    Problems with blood clotting too easily:        Skin    Rashes or ulcers:        Constitutional    Fever or chills:     PHYSICAL EXAM:   Vitals:   06/10/19 1541  BP: 132/71  Pulse: (!) 54  Resp: 14  Temp: (!) 97.5 F (36.4 C)  TempSrc: Temporal  SpO2: 97%  Weight: 163 lb (73.9 kg)  Height: 5\' 2"  (1.575 m)    GENERAL: The patient is a well-nourished female, in no acute distress. The vital signs are documented above. CARDIAC: There is a regular rate and rhythm.  VASCULAR: I do not detect carotid bruits. I cannot palpate pedal pulses however both feet are warm and well perfused. She has significant hyperpigmentation bilaterally especially on the left side as documented in the photograph below.     I did look at the left great saphenous vein myself with the SonoSite and there is significant reflux from the saphenofemoral junction down to the distal thigh.  I think we could cannulate the vein in the distal thigh.  I also identified an anterior sensory saphenous vein but without having the patient stand I did not demonstrate significant reflux. PULMONARY: There is good air exchange bilaterally without wheezing or rales. ABDOMEN: Soft and non-tender with normal pitched bowel sounds.  MUSCULOSKELETAL: There are no major deformities or cyanosis. NEUROLOGIC: No focal weakness or paresthesias are detected. SKIN: There are no ulcers or rashes noted. PSYCHIATRIC: The patient has a normal affect.  DATA:    ARTERIAL DOPPLER STUDY: I have independently  interpreted her arterial Doppler study today.  On the right side she has a biphasic posterior tibial and dorsalis pedis signal.  ABI is 91%.  Toe pressure is 82 mmHg.  On the left side she has a triphasic posterior tibial signal with a biphasic dorsalis pedis signal.  ABI is 100%.  Toe pressure is 85 mmHg.  VENOUS DUPLEX: I reviewed her previous venous duplex scan.  On the right side there is no evidence of DVT.  She had deep  venous reflux involving the common femoral vein and femoral vein.  There was superficial venous reflux in the great saphenous vein and the small saphenous vein.  The diameters in the great saphenous vein ranged from 0.52-0.66 cm.  The diameters in the right small saphenous vein ranged from 0.51-0.64 cm.  On the left side there was no evidence of DVT.  There was deep venous reflux involving the common femoral vein.  There was deep venous reflux involving the great saphenous vein from the saphenofemoral junction to the distal thigh.  The diameters of the vein ranged from 0.54-0.65 cm.

## 2019-06-15 ENCOUNTER — Other Ambulatory Visit: Payer: Self-pay | Admitting: *Deleted

## 2019-06-15 DIAGNOSIS — I83812 Varicose veins of left lower extremities with pain: Secondary | ICD-10-CM

## 2019-06-22 ENCOUNTER — Telehealth: Payer: Self-pay | Admitting: *Deleted

## 2019-06-22 ENCOUNTER — Other Ambulatory Visit: Payer: Self-pay | Admitting: *Deleted

## 2019-06-22 DIAGNOSIS — I83812 Varicose veins of left lower extremities with pain: Secondary | ICD-10-CM

## 2019-06-22 MED ORDER — LORAZEPAM 1 MG PO TABS: ORAL_TABLET | ORAL | 0 refills | Status: DC

## 2019-06-22 NOTE — Telephone Encounter (Signed)
Notified Susan [name] that prescription for Ativan 1 mg had been called into [facility] on [address].  Reminded her to take Ativan 1 mg  30 minutes prior to leaving house on day of procedure and to bring second tablet in with her to office on day of procedure.  Susan [name] verbalized understanding.

## 2019-06-24 ENCOUNTER — Other Ambulatory Visit: Payer: Self-pay

## 2019-06-24 ENCOUNTER — Encounter: Payer: Self-pay | Admitting: Vascular Surgery

## 2019-06-24 ENCOUNTER — Ambulatory Visit (INDEPENDENT_AMBULATORY_CARE_PROVIDER_SITE_OTHER): Payer: Medicare Other | Admitting: Vascular Surgery

## 2019-06-24 VITALS — BP 134/84 | HR 51 | Temp 97.8°F | Resp 16 | Ht 61.0 in | Wt 163.0 lb

## 2019-06-24 DIAGNOSIS — I83812 Varicose veins of left lower extremities with pain: Secondary | ICD-10-CM | POA: Diagnosis not present

## 2019-06-24 DIAGNOSIS — I872 Venous insufficiency (chronic) (peripheral): Secondary | ICD-10-CM

## 2019-06-24 HISTORY — PX: ENDOVENOUS ABLATION SAPHENOUS VEIN W/ LASER: SUR449

## 2019-06-24 NOTE — Progress Notes (Signed)
     Laser Ablation Procedure    Date: 06/24/2019   Susan [name] DOB:May 21, 1940  Consent signed: Yes    Surgeon: Gae Gallop MD   Procedure: Laser Ablation: left Greater Saphenous Vein  BP 134/84 (BP Location: Left Arm, Patient Position: Sitting, Cuff Size: Normal)   Pulse (!) 51   Temp 97.8 F (36.6 C) (Temporal)   Resp 16   Ht 5\' 1"  (1.549 m)   Wt 163 lb (73.9 kg)   SpO2 100%   BMI 30.80 kg/m   Tumescent Anesthesia: 450 cc 0.9% NaCl with 50 cc Lidocaine HCL 1%  and 15 cc 8.4% NaHCO3  Local Anesthesia: 6 cc Lidocaine HCL and NaHCO3 (ratio 2:1)  7 watts continuous mode     Total energy: 1467 Joules    Total time: 209 seconds Treatment Length  33 cm  Laser Fiber Ref. # [id number]     Lot # [id number]     Patient tolerated procedure well  Notes: Patient wore face mask.  All staff members wore facial masks and facial shields/goggles.  Last dose of Eliquis taken by patient on 06-21-2019.  Description of Procedure:  After marking the course of the secondary varicosities, the patient was placed on the operating table in the supine position, and the left leg was prepped and draped in sterile fashion.   Local anesthetic was administered and under ultrasound guidance the saphenous vein was accessed with a micro needle and guide wire; then the mirco puncture sheath was placed.  A guide wire was inserted saphenofemoral junction , followed by a 5 french sheath.  The position of the sheath and then the laser fiber below the junction was confirmed using the ultrasound.  Tumescent anesthesia was administered along the course of the saphenous vein using ultrasound guidance. The patient was placed in Trendelenburg position and protective laser glasses were placed on patient and staff, and the laser was fired at 7 watts continuous mode for a total of 1467 joules.       Steri strip was applied to the IV insertion site and ABD pads and thigh high compression stockings were applied.  Ace wrap  bandages were applied over the left thigh and at the top of the saphenofemoral junction. Blood loss was less than 15 cc.  Discharge instructions reviewed with patient and hardcopy of discharge instructions given to patient to take home. The patient ambulated out of the operating room having tolerated the procedure well.

## 2019-06-24 NOTE — Progress Notes (Signed)
Patient name: Susan [name] MRN: [id number] DOB: 03-03-1940 Sex: female  REASON FOR VISIT: For laser ablation of the left great saphenous vein  HPI: Susan [name] is a 79 y.o. female who I saw on 06/10/2019.  The patient has CEAP C4a venous disease.  She has failed conservative treatment including leg elevation and thigh-high compression stockings.  I felt that she would be a good good candidate for staged ablation of the great saphenous veins and also the right small saphenous vein.  However she was having significant more pain on the left side and was also being considered for a knee replacement on the left.  For this reason I thought that it made sense to address the incompetent left great saphenous vein first and then allow her to undergo knee replacement.  Once she had recovered from that we could address the right leg.  She comes in today for laser ablation of left great saphenous vein.  She is on Eliquis and we held this prior to the procedure.  Based on my imaging of the left great saphenous vein with the SonoSite I felt that we could cannulate the vein in the distal thigh.  I also identified an anterior accessory saphenous vein but did not demonstrate reflux in this.  Current Outpatient Medications  Medication Sig Dispense Refill  . acetaminophen (TYLENOL) 500 MG tablet Take 2 tablets (1,000 mg total) by mouth as needed. 30 tablet 2  . buPROPion (WELLBUTRIN SR) 200 MG 12 hr tablet TAKE 1 TABLET BY MOUTH EVERY DAY 30 tablet 5  . Calcium Carbonate-Vitamin D 600-400 MG-UNIT tablet TAKE 1 TABLET BY MOUTH EVERY DAY 30 tablet 2  . carvedilol (COREG) 12.5 MG tablet TAKE 1 TABLET BY MOUTH 2 TIMES DAILY WITH MEALS 60 tablet 5  . cholecalciferol (VITAMIN D) 25 MCG (1000 UT) tablet TAKE 1 TABLET BY MOUTH EVERY DAY 30 tablet 2  . diclofenac sodium (VOLTAREN) 1 % GEL Apply 2 g topically 4 (four) times daily. 1 Tube 5  . DOK 100 MG capsule TAKE 1 CAPSULE BY MOUTH 2 TIMES DAILY 60 capsule 5  . donepezil  (ARICEPT) 10 MG tablet Take 1 tablet (10 mg total) by mouth at bedtime. For memory loss 90 tablet 1  . FLORASTOR 250 MG capsule TAKE 1 CAPSULE BY MOUTH 2 TIMES DAILY 60 capsule 5  . furosemide (LASIX) 20 MG tablet TAKE 1 TABLET BY MOUTH EVERY DAY 90 tablet 0  . hydrALAZINE (APRESOLINE) 25 MG tablet TAKE 1 TABLET BY MOUTH 3 TIMES DAILY 90 tablet 5  . LORazepam (ATIVAN) 1 MG tablet Take 1 tablet 30 minutes prior to leaving house on day of procedure.  Bring second tablet to office on day of procedure. 2 tablet 0  . losartan (COZAAR) 50 MG tablet TAKE 1 TABLET BY MOUTH EVERY DAY 30 tablet 3  . Menthol, Topical Analgesic, (BIOFREEZE) 4 % GEL Apply 3 oz topically 3 (three) times daily. For knee pain 1 Tube 3  . Multiple Vitamin (GNP ESSENTIAL ONE DAILY) TABS TAKE 1 TABLET BY MOUTH EVERY DAY 30 tablet 11  . pantoprazole (PROTONIX) 40 MG tablet TAKE 1 TABLET BY MOUTH EVERY DAY 90 tablet 0  . POLY-IRON 150 150 MG capsule TAKE 1 CAPSULE BY MOUTH EVERY DAY 30 capsule 5  . pravastatin (PRAVACHOL) 40 MG tablet TAKE 1 TABLET BY MOUTH EVERY DAY 30 tablet 5  . sertraline (ZOLOFT) 50 MG tablet Take 1 tablet (50 mg total) by mouth daily. 30 tablet 5  .  Turmeric (QC TUMERIC COMPLEX) 500 MG CAPS Take 500 mg by mouth daily.    . vitamin B-12 (CYANOCOBALAMIN) 1000 MCG tablet TAKE 2 TABLETS BY MOUTH EVERY DAY 60 tablet 5  . apixaban (ELIQUIS) 5 MG TABS tablet Take 1 tablet (5 mg total) by mouth every 12 (twelve) hours. (Patient not taking: Reported on 06/24/2019) 60 tablet 4   No current facility-administered medications for this visit.    PHYSICAL EXAM: Vitals:   06/24/19 0855  BP: 134/84  Pulse: (!) 51  Resp: 16  Temp: 97.8 F (36.6 C)  TempSrc: Temporal  SpO2: 100%  Weight: 163 lb (73.9 kg)  Height: 5\' 1"  (1.549 m)    PROCEDURE: Endovenous laser ablation of the left great saphenous vein  TECHNIQUE: The patient was taken to the exam room and placed supine.  I looked at the left great saphenous vein  myself with the SonoSite and this appeared to be accessible at the level of the knee.  The left leg was prepped and draped in usual sterile fashion.  After the skin was anesthetized with 1% lidocaine, under ultrasound guidance, the left great saphenous vein was cannulated at the level of the knee and a micropuncture sheath was advanced over the wire.  I then remove the wire and dilator and the J-wire was advanced to just below the saphenofemoral junction.  45 cm sheath was then advanced over the wire and positioned 2 and half centimeters distal to the saphenofemoral junction.  The wire and dilator were removed.  The laser fiber was positioned at the end of the sheath and then the sheath retracted to expose the laser fiber.  This was again confirmed that this was 2.5 centimeters distal to the saphenofemoral junction.  Next tumescent anesthesia was administered circumferentially around the vein up to the saphenofemoral junction.  The patient was then placed in Trendelenburg.  Laser precautions were taken and laser ablation was performed of the left great saphenous vein from 2.5 cm distal to the saphenofemoral junction to the level of the knee.  33 cm of vein were treated.  1467 J were used.  Patient tolerated the procedure well.  Pressure dressing was applied.  She will return next week for follow-up duplex.  Deitra Mayo [facility] [phone]

## 2019-06-29 ENCOUNTER — Ambulatory Visit: Payer: Medicare Other | Admitting: Orthopaedic Surgery

## 2019-06-29 ENCOUNTER — Ambulatory Visit: Payer: Self-pay

## 2019-06-29 ENCOUNTER — Encounter: Payer: Self-pay | Admitting: Orthopaedic Surgery

## 2019-06-29 VITALS — Ht 61.0 in | Wt 163.0 lb

## 2019-06-29 DIAGNOSIS — M1712 Unilateral primary osteoarthritis, left knee: Secondary | ICD-10-CM | POA: Diagnosis not present

## 2019-06-29 NOTE — Progress Notes (Signed)
Office Visit Note   Patient: Susan [name]           Date of Birth: 18-Apr-1940           MRN: [id number] Visit Date: 06/29/2019              Requested by: Sonia Side., FNP [place],  [place] [address] PCP: Sonia Side., FNP   Assessment & Plan: Visit Diagnoses:  1. Primary osteoarthritis of left knee     Plan: I impression is end-stage left knee tricompartmental DJD.  She has not gotten relief from conservative treatments and therefore wishes to proceed with a left total knee replacement as this is very functionally limiting.  We will obtain preoperative clearance from her cardiologist and primary care provider.  She takes Eliquis which she will have to stop 3 days in advance of surgery and for spinal anesthesia.  The plan is to send her to SNF postoperatively which is where she went after her first knee replacement.  Once we get the clearances we will schedule her surgery and we look forward to treating her in the operating room.  Follow-Up Instructions: Return if symptoms worsen or fail to improve.   Orders:  Orders Placed This Encounter  Procedures  . XR KNEE 3 VIEW LEFT   No orders of the defined types were placed in this encounter.     Procedures: No procedures performed   Clinical Data: No additional findings.   Subjective: Chief Complaint  Patient presents with  . Left Knee - Pain    Susan [name] is 79 year old female who returns today for chronic left knee pain.  We saw her about a year ago for this.  She is here with her daughter.  She recently had ablation of her left leg for varicose veins.  She is having a lot of pain in her left knee that is functionally limiting.  She is unable to sleep at night.  She did well from the right knee replacement about 7 years ago that was done by Dr. Percell Miller.   Review of Systems  Constitutional: Negative.   HENT: Negative.   Eyes: Negative.   Respiratory: Negative.   Cardiovascular: Negative.   Endocrine:  Negative.   Musculoskeletal: Negative.   Neurological: Negative.   Hematological: Negative.   Psychiatric/Behavioral: Negative.   All other systems reviewed and are negative.    Objective: Vital Signs: Ht 5\' 1"  (1.549 m)   Wt 163 lb (73.9 kg)   BMI 30.80 kg/m   Physical Exam Vitals and nursing note reviewed.  Constitutional:      Appearance: She is well-developed.  Pulmonary:     Effort: Pulmonary effort is normal.  Skin:    General: Skin is warm.     Capillary Refill: Capillary refill takes less than 2 seconds.  Neurological:     Mental Status: She is alert and oriented to person, place, and time.  Psychiatric:        Behavior: Behavior normal.        Thought Content: Thought content normal.        Judgment: Judgment normal.     Ortho Exam Left knee shows trace joint effusion.  Painful range of motion.  Slight limitation range of motion.  Collaterals and cruciates are stable. Specialty Comments:  No specialty comments available.  Imaging: XR KNEE 3 VIEW LEFT  Result Date: 06/29/2019 Advanced tricompartmental DJD    PMFS History: Patient Active Problem List  Diagnosis Date Noted  . 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 Anemia of chronic disease 09/07/2018  . Unilateral primary osteoarthritis, left knee 06/16/2018  . Venous stasis dermatitis of left lower extremity 04/29/2018  . Dementia ([place]) 03/12/2018  . Recurrent cellulitis 02/12/2018  . HTN (hypertension) 02/12/2018  . Cellulitis and abscess of leg 12/29/2017  . CKD (chronic kidney disease) stage 3, GFR 30-59 ml/min 12/29/2017  . Cellulitis of left lower extremity   . Ankle edema   . Sepsis ([place]) 12/21/2017  . Medication management 10/11/2016  . Hyperlipemia 06/18/2016  . Gait abnormality 06/18/2016  . Major depressive disorder, recurrent episode, severe ([place] [facility]) 11/21/2015  . Edema 02/09/2015  . History of CVA (cerebrovascular accident) 02/01/2015  . 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 Chronic low back pain 01/28/2015  . Short-term memory loss 01/28/2015  . Volume depletion 01/28/2015  . Persistent atrial fibrillation 01/26/2015  . DCM (dilated cardiomyopathy) ([place]) 01/26/2015  . TIA (transient ischemic attack) 01/24/2015  . Chronic diastolic (congestive) heart failure ([place])   . Obesity (BMI 30-39.9) 01/27/2013  . Constipation 09/02/2012  . H/O arthroscopy of right knee 08/06/2012  . Anxiety   . Depression   . GERD (gastroesophageal reflux disease)   . Hypertensive heart disease   . OSA (obstructive sleep apnea)   . Osteoarthritis of right knee   . Hyperparathyroidism, primary ([place]) 04/22/2012  . Chronic cough 10/02/2011   Past Medical History:  Diagnosis Date  . Anxiety   . Arthritis    "legs, back" (07/29/2012)  . Cellulitis and abscess of leg 12/29/2017  . Chronic diastolic (congestive) heart failure ([place])   . Chronic lower back pain   . Complication of anesthesia    "I have apnea" (07/29/2012)  . Dementia ([place]) 03/12/2018  . 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 Family history of anesthesia complication    "some wake up during OR; some are hard to wake up; some both" (07/29/2012)  . GERD (gastroesophageal reflux disease)   . 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 ([place])   . History of CVA (cerebrovascular accident) 02/01/2015  . History of stomach ulcers 1980's  . Hyperlipidemia   . Hyperparathyroidism, primary ([place]) 04/22/2012  . Hypertensive heart disease   . Incontinent of urine    wears pads  . Major depressive disorder, recurrent episode, severe ([place]) 11/21/2015  . OSA on CPAP   . Osteoarthritis of right knee   . Pedal edema   . Persistent atrial fibrillation ([facility])    CHADS2VASC score is 7 - on chronic anticoagulation with Apixaban  . Spinal stenosis   . Umbilical hernia    "unrepaired" (07/29/2012)    . Varicose veins    "BLE" (07/29/2012)  . Vascular dementia ([place])   . Venous stasis dermatitis of left lower extremity 04/29/2018    Family History  Problem Relation Age of Onset  . Hypertension Mother        deceased  . Stroke Mother   . Breast cancer Mother   . Lung cancer Father        deceased  . Diabetes Daughter   . Hypertension Daughter     Past Surgical History:  Procedure Laterality Date  . CATARACT EXTRACTION    . CHOLECYSTECTOMY  ~ 2010  . COLONOSCOPY    . ENDOVENOUS ABLATION SAPHENOUS VEIN W/ LASER Left 06/24/2019  endovenous laser ablation left greater saphenous vein by Gae Gallop MD   . IUD REMOVAL  [phone]  . PARATHYROIDECTOMY N/A 06/16/2012   Procedure: NECK EXPLORATION AND LEFT SUPERIOR PARATHYROIDECTOMY;  Surgeon: Earnstine Regal, MD;  Location: [facility];  Service: General;  Laterality: N/A;  . 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 TOTAL KNEE ARTHROPLASTY Right 07/29/2012   Procedure: TOTAL KNEE ARTHROPLASTY;  Surgeon: Ninetta Lights, MD;  Location: [facility];  Service: Orthopedics;  Laterality: Right;   Social History   Occupational History  . Occupation: retired    Fish farm manager: RETIRED  Tobacco Use  . Smoking status: Former Smoker    Packs/day: 1.00    Years: 30.00    Pack years: 30.00    Types: Cigarettes    Quit date: 01/07/1978    Years since quitting: 41.5  . Smokeless tobacco: Never Used  Vaping Use  . Vaping Use: Never used  Substance and Sexual Activity  . Alcohol use: No  . Drug use: No  . Sexual activity: Not Currently    Comment: intercourse age 18,less than 5 secxual partners,

## 2019-07-01 ENCOUNTER — Ambulatory Visit (HOSPITAL_COMMUNITY)
Admission: RE | Admit: 2019-07-01 | Discharge: 2019-07-01 | Disposition: A | Discharge status: Home / Self Care | Payer: Medicare Other | Source: Ambulatory Visit | Attending: Surgery | Admitting: Surgery

## 2019-07-01 ENCOUNTER — Other Ambulatory Visit: Payer: Self-pay

## 2019-07-01 ENCOUNTER — Ambulatory Visit (INDEPENDENT_AMBULATORY_CARE_PROVIDER_SITE_OTHER): Payer: Medicare Other | Admitting: Vascular Surgery

## 2019-07-01 ENCOUNTER — Encounter: Payer: Self-pay | Admitting: Vascular Surgery

## 2019-07-01 VITALS — BP 163/74 | HR 55 | Temp 97.7°F | Resp 18 | Ht 61.0 in | Wt 163.0 lb

## 2019-07-01 DIAGNOSIS — I872 Venous insufficiency (chronic) (peripheral): Secondary | ICD-10-CM

## 2019-07-01 DIAGNOSIS — I83812 Varicose veins of left lower extremities with pain: Secondary | ICD-10-CM | POA: Diagnosis not present

## 2019-07-01 NOTE — Progress Notes (Signed)
Patient name: Susan [name] MRN: [id number] DOB: 1940/03/25 Sex: female  REASON FOR VISIT: Follow-up after endovenous laser ablation of the left great saphenous vein  HPI: Susan [name] is a 79 y.o. female with significant chronic venous insufficiency and CEAP C4a venous disease.  She failed conservative treatment and was felt to be a good candidate for staged laser ablation of the great saphenous veins.  Her symptoms were more significant on the left side and she underwent laser ablation of the left great saphenous vein from 2 cm distal to the saphenofemoral junction to the knee on 06/24/2019.  She comes in for a follow-up visit.  She has no specific complaints.  She had minimal bruising.  She continues to wear her thigh-high compression stockings.  She is considering knee replacement on the left with Dr. Erlinda Hong.   Current Outpatient Medications  Medication Sig Dispense Refill  . acetaminophen (TYLENOL) 500 MG tablet Take 2 tablets (1,000 mg total) by mouth as needed. 30 tablet 2  . apixaban (ELIQUIS) 5 MG TABS tablet Take 1 tablet (5 mg total) by mouth every 12 (twelve) hours. 60 tablet 4  . buPROPion (WELLBUTRIN SR) 200 MG 12 hr tablet TAKE 1 TABLET BY MOUTH EVERY DAY 30 tablet 5  . Calcium Carbonate-Vitamin D 600-400 MG-UNIT tablet TAKE 1 TABLET BY MOUTH EVERY DAY 30 tablet 2  . carvedilol (COREG) 12.5 MG tablet TAKE 1 TABLET BY MOUTH 2 TIMES DAILY WITH MEALS 60 tablet 5  . cholecalciferol (VITAMIN D) 25 MCG (1000 UT) tablet TAKE 1 TABLET BY MOUTH EVERY DAY 30 tablet 2  . diclofenac sodium (VOLTAREN) 1 % GEL Apply 2 g topically 4 (four) times daily. 1 Tube 5  . DOK 100 MG capsule TAKE 1 CAPSULE BY MOUTH 2 TIMES DAILY 60 capsule 5  . donepezil (ARICEPT) 10 MG tablet Take 1 tablet (10 mg total) by mouth at bedtime. For memory loss 90 tablet 1  . FLORASTOR 250 MG capsule TAKE 1 CAPSULE BY MOUTH 2 TIMES DAILY 60 capsule 5  . furosemide (LASIX) 20 MG tablet TAKE 1 TABLET BY MOUTH EVERY DAY 90 tablet  0  . hydrALAZINE (APRESOLINE) 25 MG tablet TAKE 1 TABLET BY MOUTH 3 TIMES DAILY 90 tablet 5  . LORazepam (ATIVAN) 1 MG tablet Take 1 tablet 30 minutes prior to leaving house on day of procedure.  Bring second tablet to office on day of procedure. 2 tablet 0  . losartan (COZAAR) 50 MG tablet TAKE 1 TABLET BY MOUTH EVERY DAY 30 tablet 3  . Menthol, Topical Analgesic, (BIOFREEZE) 4 % GEL Apply 3 oz topically 3 (three) times daily. For knee pain 1 Tube 3  . Multiple Vitamin (GNP ESSENTIAL ONE DAILY) TABS TAKE 1 TABLET BY MOUTH EVERY DAY 30 tablet 11  . pantoprazole (PROTONIX) 40 MG tablet TAKE 1 TABLET BY MOUTH EVERY DAY 90 tablet 0  . POLY-IRON 150 150 MG capsule TAKE 1 CAPSULE BY MOUTH EVERY DAY 30 capsule 5  . pravastatin (PRAVACHOL) 40 MG tablet TAKE 1 TABLET BY MOUTH EVERY DAY 30 tablet 5  . sertraline (ZOLOFT) 50 MG tablet Take 1 tablet (50 mg total) by mouth daily. 30 tablet 5  . Turmeric (QC TUMERIC COMPLEX) 500 MG CAPS Take 500 mg by mouth daily.    . vitamin B-12 (CYANOCOBALAMIN) 1000 MCG tablet TAKE 2 TABLETS BY MOUTH EVERY DAY 60 tablet 5   No current facility-administered medications for this visit.   REVIEW OF SYSTEMS: Valu.Nieves ] denotes  positive finding; [  ] denotes negative finding  CARDIOVASCULAR:  [ ]  chest pain   [ ]  dyspnea on exertion  [ ]  leg swelling  CONSTITUTIONAL:  [ ]  fever   [ ]  chills  PHYSICAL EXAM: Vitals:   07/01/19 1052  BP: (!) 163/74  Pulse: (!) 55  Resp: 18  Temp: 97.7 F (36.5 C)  TempSrc: Temporal  SpO2: 100%  Weight: 163 lb (73.9 kg)  Height: 5\' 1"  (1.549 m)   GENERAL: The patient is a well-nourished female, in no acute distress. The vital signs are documented above. CARDIOVASCULAR: There is a regular rate and rhythm. PULMONARY: There is good air exchange bilaterally without wheezing or rales. She has minimal swelling in the left leg.  DATA:  VENOUS DUPLEX: I have independently interpreted her venous duplex scan today.  There is no evidence of DVT  in the left lower extremity.  The left great saphenous vein is successfully closed from 1.7 cm distal to the saphenofemoral junction to the knee.  MEDICAL ISSUES:  STATUS POST LASER ABLATION LEFT GREAT SAPHENOUS VEIN: The patient is doing well status post laser ablation of the left great saphenous vein.  From my standpoint I think she can proceed with her left knee replacement in 6 weeks.  When she is recovered from her knee replacement I will see her back in early December and will discuss laser ablation of the right great saphenous vein.  She knows to call sooner if she has problems.  Deitra Mayo [facility] [phone]

## 2019-07-13 ENCOUNTER — Telehealth: Payer: Self-pay | Admitting: *Deleted

## 2019-07-13 NOTE — Telephone Encounter (Signed)
° °  [facility] Pre-operative Risk Assessment    HEARTCARE STAFF: - Please ensure there is not already an duplicate clearance open for this procedure. - Under Visit Info/Reason for Call, type in Other and utilize the format Clearance MM/DD/YY or Clearance TBD. Do not use dashes or single digits. - If request is for dental extraction, please clarify the # of teeth to be extracted.  Request for surgical clearance:  1. What type of surgery is being performed? LEFT TOTAL KNEE ARTHROPLASTY   2. When is this surgery scheduled? TBD   3. What type of clearance is required (medical clearance vs. Pharmacy clearance to hold med vs. Both)? BOTH  4. Are there any medications that need to be held prior to surgery and how long? ELIQUIS  5. Practice name and name of physician performing surgery? [facility]; [facility]   6. What is the office phone number? [phone]   7.   What is the office fax number? [place].   Anesthesia type (None, local, MAC, general) ? SPINAL & BLOCK   Julaine Hua 07/13/2019, 4:32 PM  _________________________________________________________________   (provider comments below)

## 2019-07-13 NOTE — Telephone Encounter (Signed)
   Primary Cardiologist:Traci Turner, MD  Chart reviewed as part of pre-operative protocol coverage. Because of BRIAWNA CARVER past medical history and time since last visit, they will require a follow-up visit in order to better assess preoperative cardiovascular risk.  Pre-op covering staff: - Please schedule appointment and call patient to inform them. If patient already had an upcoming appointment within acceptable timeframe, please add "pre-op clearance" to the appointment notes so provider is aware. - Please contact requesting surgeon's office via preferred method (i.e, phone, fax) to inform them of need for appointment prior to surgery.  If applicable, this message will also be routed to pharmacy pool and/or primary cardiologist for input on holding anticoagulant/antiplatelet agent as requested below so that this information is available to the clearing provider at time of patient's appointment.   Clinical pharmacist to review Eliquis.   [place], [place]  07/13/2019, 6:49 PM

## 2019-07-14 NOTE — Telephone Encounter (Signed)
I have made multiple attempts to get in contact with the patient on the number listed for her the phone rings once then the voice mail picks up.   I got in contact with her daughter Tache Bobst she stated that she can make the appointment for her mother. Patient is scheduled to see Ermalinda Barrios, PA-C on 0811/2021at 8:45AM. Daughter verbalized an understanding and all questions (if any) were answered.

## 2019-07-14 NOTE — Telephone Encounter (Addendum)
Will schedule pt in pharmacy clinic after her appointment with Nena Polio to coordinate lovenox bridge. I spoke with patient and made her aware that we will meet with her to discuss bridge on 8/11 after she sees michele. She requested I send her a reminder in the mail.

## 2019-07-14 NOTE — Telephone Encounter (Signed)
Patient with diagnosis of afib on Eliquis for anticoagulation.    Procedure: LEFT TOTAL KNEE ARTHROPLASTY Date of procedure: TBD  CHADS2-VASc score of  7 (CHF, HTN, AGE, stroke/tia x 2,  AGE, female)  CrCl 27 ml/min  Would typically recommend 3-3.5 day hold due to patient renal function and spinal anesthesia. However, patient is high risk off anticoagulation due to hx of stoke. I will ask Dr. Radford Pax to weigh in.

## 2019-07-14 NOTE — Telephone Encounter (Signed)
Susan [name] you arrange for bridging with Lovenox? Thank you, [place] Siren

## 2019-07-27 ENCOUNTER — Telehealth: Payer: Self-pay | Admitting: Cardiology

## 2019-07-27 NOTE — Telephone Encounter (Signed)
Spoke with the patient who states that she is going out of town for a week. Wanted to make sure that she was okay to fly. She states that she is not bringing her CPAP machine and wanted to check and see if it would be okay for her not use it for one week.

## 2019-07-27 NOTE — Telephone Encounter (Signed)
Susan [name] is calling stating she is getting on a plane and flying Saturday and wanted to make sure there isn't anything she needs to do prior to doing so. Please advise.

## 2019-07-27 NOTE — Telephone Encounter (Signed)
Left message for patient to call back  

## 2019-07-27 NOTE — Telephone Encounter (Signed)
WOuld recommend that she take her PAP with her.  SHe has severe OSA and she risks issues with afib if she does not use it for a week.  ALso will likely be fatigued if not using it

## 2019-07-27 NOTE — Telephone Encounter (Signed)
Patient returning call.

## 2019-07-29 NOTE — Telephone Encounter (Signed)
Patient calling back.   °

## 2019-07-29 NOTE — Telephone Encounter (Signed)
Left message for patient with recommendations per Dr. Radford Pax. Advised to call back with any questions.

## 2019-07-29 NOTE — Telephone Encounter (Signed)
Patient returning call.

## 2019-07-29 NOTE — Telephone Encounter (Signed)
Spoke with the patient who wanted to know if there was anything else that she could do other than bringing her CPAP machine with her. I advised her that there is nothing that she could do/bring that would replace her CPAP. I advised her to call the airline to see if they allow medical equipment to be checked for free. Patient verbalized understanding.

## 2019-07-29 NOTE — Telephone Encounter (Signed)
Spoke with the patient who states that it will be very difficult for her to bring CPAP with her. I once again advised on Dr. Theodosia Blender recommendations that she bring it with her and the risks of not using it for one week. Patient verbalized understanding and thanked me for the call.

## 2019-08-17 NOTE — Progress Notes (Signed)
Cardiology Office Note    Date:  08/18/2019   ID:  Susan [name], DOB 29-Aug-1940, MRN [id number]  PCP:  Sonia Side., FNP  Cardiologist: Fransico Him, MD EPS: None  Chief Complaint  Patient presents with  . Pre-op Exam    History of Present Illness:  Susan [name] is a 79 y.o. female with history chronic diastolic CHF, persistent Afib on Eliquis, hypertension, HTN, HLD, OSA on CPAP.  Followed by Dr. Scot Dock for PVD.  Patient had a telemedicine visit with Dr. Radford Pax 09/09/18  Patient scheduled for left total knee arthroplasty by Dr. Eduard Roux.  CHA2DS2-VASc score equals 7 and she will need a Lovenox bridge.  Patient here with her daughter. Denies chest pain, palpitations, dyspnea, dizziness or presyncope. A little swelling on her left lower leg because of knee. Walks with a walker about 10 mins daily.   Past Medical History:  Diagnosis Date  . Anxiety   . Arthritis    "legs, back" (07/29/2012)  . Cellulitis and abscess of leg 12/29/2017  . Chronic diastolic (congestive) heart failure ([place])   . Chronic lower back pain   . Complication of anesthesia    "I have apnea" (07/29/2012)  . Dementia ([place]) 03/12/2018  . 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 Family history of anesthesia complication    "some wake up during OR; some are hard to wake up; some both" (07/29/2012)  . GERD (gastroesophageal reflux disease)   . 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 ([place])   . History of CVA (cerebrovascular accident) 02/01/2015  . History of stomach ulcers 1980's  . Hyperlipidemia   . Hyperparathyroidism, primary ([place]) 04/22/2012  . Hypertensive heart disease   . Incontinent of urine    wears pads  . Major depressive disorder, recurrent episode, severe ([place]) 11/21/2015  . OSA on CPAP   . Osteoarthritis of right knee   . Pedal edema   . Persistent atrial fibrillation (H[facility])    CHADS2VASC score is 7 - on chronic anticoagulation with Apixaban  . Spinal  stenosis   . Umbilical hernia    "unrepaired" (07/29/2012)  . Varicose veins    "BLE" (07/29/2012)  . Vascular dementia ([place])   . Venous stasis dermatitis of left lower extremity 04/29/2018    Past Surgical History:  Procedure Laterality Date  . CATARACT EXTRACTION    . CHOLECYSTECTOMY  ~ 2010  . COLONOSCOPY    . ENDOVENOUS ABLATION SAPHENOUS VEIN W/ LASER Left 06/24/2019   endovenous laser ablation left greater saphenous vein by Gae Gallop MD   . IUD REMOVAL  [phone]  . PARATHYROIDECTOMY N/A 06/16/2012   Procedure: NECK EXPLORATION AND LEFT SUPERIOR PARATHYROIDECTOMY;  Surgeon: Earnstine Regal, MD;  Location: [facility];  Service: General;  Laterality: N/A;  . 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 TOTAL KNEE ARTHROPLASTY Right 07/29/2012   Procedure: TOTAL KNEE ARTHROPLASTY;  Surgeon: Ninetta Lights, MD;  Location: [facility];  Service: Orthopedics;  Laterality: Right;    Current Medications: Current Meds  Medication Sig  . acetaminophen (TYLENOL) 500 MG tablet Take 2 tablets (1,000 mg total) by mouth as needed.  Marland Kitchen apixaban (ELIQUIS) 5 MG TABS tablet Take 1 tablet (5 mg total) by mouth every 12 (twelve) hours.  Marland Kitchen buPROPion (WELLBUTRIN SR) 200 MG 12 hr tablet TAKE 1 TABLET BY MOUTH EVERY DAY  . Calcium Carbonate-Vitamin D 600-400 MG-UNIT tablet TAKE 1 TABLET BY MOUTH EVERY DAY  . carvedilol (  COREG) 12.5 MG tablet TAKE 1 TABLET BY MOUTH 2 TIMES DAILY WITH MEALS  . cholecalciferol (VITAMIN D) 25 MCG (1000 UT) tablet TAKE 1 TABLET BY MOUTH EVERY DAY  . diclofenac sodium (VOLTAREN) 1 % GEL Apply 2 g topically 4 (four) times daily.  Marland Kitchen DOK 100 MG capsule TAKE 1 CAPSULE BY MOUTH 2 TIMES DAILY  . donepezil (ARICEPT) 10 MG tablet Take 1 tablet (10 mg total) by mouth at bedtime. For memory loss  . FLORASTOR 250 MG capsule TAKE 1 CAPSULE BY MOUTH 2 TIMES DAILY  . furosemide (LASIX) 20 MG tablet TAKE 1 TABLET BY MOUTH EVERY DAY  . hydrALAZINE (APRESOLINE) 25 MG tablet TAKE 1 TABLET BY MOUTH 3 TIMES  DAILY  . LORazepam (ATIVAN) 1 MG tablet Take 1 tablet 30 minutes prior to leaving house on day of procedure.  Bring second tablet to office on day of procedure.  Marland Kitchen losartan (COZAAR) 50 MG tablet TAKE 1 TABLET BY MOUTH EVERY DAY  . Menthol, Topical Analgesic, (BIOFREEZE) 4 % GEL Apply 3 oz topically 3 (three) times daily. For knee pain  . Multiple Vitamin (GNP ESSENTIAL ONE DAILY) TABS TAKE 1 TABLET BY MOUTH EVERY DAY  . pantoprazole (PROTONIX) 40 MG tablet TAKE 1 TABLET BY MOUTH EVERY DAY  . POLY-IRON 150 150 MG capsule TAKE 1 CAPSULE BY MOUTH EVERY DAY  . pravastatin (PRAVACHOL) 40 MG tablet TAKE 1 TABLET BY MOUTH EVERY DAY  . sertraline (ZOLOFT) 50 MG tablet Take 1 tablet (50 mg total) by mouth daily.  . Turmeric (QC TUMERIC COMPLEX) 500 MG CAPS Take 500 mg by mouth daily.  . vitamin B-12 (CYANOCOBALAMIN) 1000 MCG tablet TAKE 2 TABLETS BY MOUTH EVERY DAY     Allergies:   Ace inhibitors, Lipitor [atorvastatin], Simvastatin, and Latex   Social History   Socioeconomic History  . Marital status: Widowed    Spouse name: Not on file  . Number of children: Not on file  . Years of education: Not on file  . Highest education level: Not on file  Occupational History  . Occupation: retired    Fish farm manager: RETIRED  Tobacco Use  . Smoking status: Former Smoker    Packs/day: 1.00    Years: 30.00    Pack years: 30.00    Types: Cigarettes    Quit date: 01/07/1978    Years since quitting: 41.6  . Smokeless tobacco: Never Used  Vaping Use  . Vaping Use: Never used  Substance and Sexual Activity  . Alcohol use: No  . Drug use: No  . Sexual activity: Not Currently    Comment: intercourse age 14,less than 5 secxual partners,  Other Topics Concern  . Not on file  Social History Narrative   Lives at home alone   Right-handed   Caffeine: 1 cup of coffee per day         Diet:      Caffeine:      Married, if yes what year:       Do you live in a house, apartment, assisted living, condo,  trailer, ect:      Pets:      Current/Past profession:      Exercise:         Living Will: Yes   DNR: No   POA/HPOA: Yes      Functional Status:   Do you have difficulty bathing or dressing yourself? Yes   Do you have difficulty preparing food or eating? No   Do you  have difficulty managing your medications? Yes   Do you have difficulty managing your finances? Yes   Do you have difficulty affording your medications? Yes   Social Determinants of Health   Financial Resource Strain:   . Difficulty of Paying Living Expenses:   Food Insecurity:   . Worried About Charity fundraiser in the Last Year:   . Arboriculturist in the Last Year:   Transportation Needs:   . Film/video editor (Medical):   Marland Kitchen Lack of Transportation (Non-Medical):   Physical Activity:   . Days of Exercise per Week:   . Minutes of Exercise per Session:   Stress:   . Feeling of Stress :   Social Connections:   . Frequency of Communication with Friends and Family:   . Frequency of Social Gatherings with Friends and Family:   . Attends Religious Services:   . Active Member of Clubs or Organizations:   . Attends Archivist Meetings:   Marland Kitchen Marital Status:      Family History:  The patient's family history includes Breast cancer in her mother; Diabetes in her daughter; Hypertension in her daughter and mother; Lung cancer in her father; Stroke in her mother.   ROS:   Please see the history of present illness.    ROS All other systems reviewed and are negative.   PHYSICAL EXAM:   VS:  BP 134/70   Pulse 75   Ht 5\' 1"  (1.549 m)   Wt 167 lb (75.8 kg)   SpO2 97%   BMI 31.55 kg/m   Physical Exam  GEN: Well nourished, well developed, in no acute distress  Neck: no JVD, carotid bruits, or masses Cardiac:RRR; 2/6 systolic murmur the left sternal border Respiratory:  clear to auscultation bilaterally, normal work of breathing GI: soft, nontender, nondistended, + BS Ext: without cyanosis,  clubbing, or edema, Good distal pulses bilaterally Neuro:  Alert and Oriented x 3, Psych: euthymic mood, full affect  Wt Readings from Last 3 Encounters:  08/18/19 167 lb (75.8 kg)  07/01/19 163 lb (73.9 kg)  06/29/19 163 lb (73.9 kg)      Studies/Labs Reviewed:   EKG:  EKG I  ordered today.  The ekg ordered today demonstrates normal sinus rhythm/A. fib with LVH and RBBB which is new but previous conduction delay.  Reviewed with Dr. Marlou Porch.  Recent Labs: 05/27/2019: ALT 16; BUN 27; Creatinine 1.49; Hemoglobin 11.0; Platelet Count 204; Potassium 3.9; Sodium 142   Lipid Panel    Component Value Date/Time   CHOL 179 02/13/2018 0423   CHOL 215 (H) 04/06/2015 1136   TRIG 65 02/13/2018 0423   HDL 63 02/13/2018 0423   HDL 63 04/06/2015 1136   CHOLHDL 2.8 02/13/2018 0423   VLDL 13 02/13/2018 0423   LDLCALC 103 (H) 02/13/2018 0423   LDLCALC 117 (H) 09/05/2017 0803    Additional studies/ records that were reviewed today include:    Echo 2017 Study Conclusions   - Left ventricle: The cavity size was normal. There was mild    concentric hypertrophy. Systolic function was normal. The    estimated ejection fraction was in the range of 55% to 60%. Wall    motion was normal; there were no regional wall motion    abnormalities.  - Aortic valve: Transvalvular velocity was minimally increased.    There was no stenosis.  - Mitral valve: Calcified annulus. There was mild regurgitation.  - Left atrium: The atrium was mildly dilated.  -  Pulmonary arteries: Systolic pressure was mildly increased.   Impressions:   - EF improved when compared to prior (35%)    ASSESSMENT:    1. Preoperative clearance   2. Longstanding persistent atrial fibrillation ([place])   3. Chronic diastolic CHF (congestive heart failure) (HCC)   4. Stage 3 chronic kidney disease, unspecified whether stage 3a or 3b CKD   5. Essential hypertension   6. OSA (obstructive sleep apnea)   7. Heart murmur      PLAN:   In order of problems listed above:  Preoperative clearance before undergoing left knee surgery by Dr. Eduard Roux.  CHA2DS2-VASc equals 7 so will need a Lovenox bridge.  Pharmacy working with patient now.  Cardiac risk index is elevated at 6.6 but METs is over 4.  She has persistent atrial fibrillation, chronic diastolic CHF.  No history of CAD.  She is asymptomatic.  New right bundle branch block on EKG but previous conduction delay.  Reviewed with Dr. Marlou Porch.  No need for ischemic work-up before being cleared for surgery.  She does have a new heart murmur and will order 2D echo.  If this is stable we will clear her for surgery.  According to the Revised Cardiac Risk Index (RCRI), her Perioperative Risk of Major Cardiac Event is (%): 6.6  Her Functional Capacity in METs is: 4.36 according to the Duke Activity Status Index (DASI).    Perisistent Afib on eliquis and carvedilol has some normal sinus rhythm on EKG today.  Chronic diastolic CHF  CKD stage 3 creatinine 1.49 in May.  Will recheck today.  May need to decrease Eliquis if over 1.5.  HTN controlled  OSA on CPAP    Medication Adjustments/Labs and Tests Ordered: Current medicines are reviewed at length with the patient today.  Concerns regarding medicines are outlined above.  Medication changes, Labs and Tests ordered today are listed in the Patient Instructions below. Patient Instructions  Medication Instructions:  Your physician recommends that you continue on your current medications as directed. Please refer to the Current Medication list given to you today.  *If you need a refill on your cardiac medications before your next appointment, please call your pharmacy*   Lab Work: TODAY: BMET  If you have labs (blood work) drawn today and your tests are completely normal, you will receive your results only by: Marland Kitchen MyChart Message (if you have MyChart) OR . A paper copy in the mail If you have any lab test that is abnormal or  we need to change your treatment, we will call you to review the results.   Testing/Procedures: Your physician has requested that you have an echocardiogram. Echocardiography is a painless test that uses sound waves to create images of your heart. It provides your doctor with information about the size and shape of your heart and how well your heart's chambers and valves are working. This procedure takes approximately one hour. There are no restrictions for this procedure.     Follow-Up: At [facility], you and your health needs are our priority.  As part of our continuing mission to provide you with exceptional heart care, we have created designated Provider Care Teams.  These Care Teams include your primary Cardiologist (physician) and Advanced Practice Providers (APPs -  Physician Assistants and Nurse Practitioners) who all work together to provide you with the care you need, when you need it.  We recommend signing up for the patient portal called "MyChart".  Sign up information is provided on  this After Visit Summary.  MyChart is used to connect with patients for Virtual Visits (Telemedicine).  Patients are able to view lab/test results, encounter notes, upcoming appointments, etc.  Non-urgent messages can be sent to your provider as well.   To learn more about what you can do with MyChart, go to [email or web address].    Your next appointment:   11/18/2019  The format for your next appointment:   In Person  Provider:   Fransico Him, MD   Other Instructions None     Signed, Ermalinda Barrios, PA-C  08/18/2019 9:57 AM    [address], [place], [place]  [address] Phone: [phone]; Fax: [phone]

## 2019-08-18 ENCOUNTER — Ambulatory Visit (INDEPENDENT_AMBULATORY_CARE_PROVIDER_SITE_OTHER): Payer: Medicare Other | Admitting: Pharmacist

## 2019-08-18 ENCOUNTER — Encounter: Payer: Self-pay | Admitting: Physician Assistant

## 2019-08-18 ENCOUNTER — Ambulatory Visit (INDEPENDENT_AMBULATORY_CARE_PROVIDER_SITE_OTHER): Payer: Medicare Other | Admitting: Physician Assistant

## 2019-08-18 ENCOUNTER — Other Ambulatory Visit: Payer: Self-pay

## 2019-08-18 VITALS — BP 134/70 | HR 75 | Ht 61.0 in | Wt 167.0 lb

## 2019-08-18 DIAGNOSIS — I10 Essential (primary) hypertension: Secondary | ICD-10-CM | POA: Diagnosis not present

## 2019-08-18 DIAGNOSIS — G4733 Obstructive sleep apnea (adult) (pediatric): Secondary | ICD-10-CM

## 2019-08-18 DIAGNOSIS — N183 Chronic kidney disease, stage 3 unspecified: Secondary | ICD-10-CM

## 2019-08-18 DIAGNOSIS — Z01818 Encounter for other preprocedural examination: Secondary | ICD-10-CM

## 2019-08-18 DIAGNOSIS — I4811 Longstanding persistent atrial fibrillation: Secondary | ICD-10-CM

## 2019-08-18 DIAGNOSIS — R011 Cardiac murmur, unspecified: Secondary | ICD-10-CM

## 2019-08-18 DIAGNOSIS — I5032 Chronic diastolic (congestive) heart failure: Secondary | ICD-10-CM | POA: Diagnosis not present

## 2019-08-18 LAB — BASIC METABOLIC PANEL
BUN/Creatinine Ratio: 17 (ref 12–28)
BUN: 24 mg/dL (ref 8–27)
CO2: 21 mmol/L (ref 20–29)
Calcium: 10.3 mg/dL (ref 8.7–10.3)
Chloride: 107 mmol/L — ABNORMAL HIGH (ref 96–106)
Creatinine, Ser: 1.44 mg/dL — ABNORMAL HIGH (ref 0.57–1.00)
GFR calc Af Amer: 39 mL/min/{1.73_m2} — ABNORMAL LOW (ref >59)
GFR calc non Af Amer: 34 mL/min/{1.73_m2} — ABNORMAL LOW (ref >59)
Glucose: 120 mg/dL — ABNORMAL HIGH (ref 65–99)
Potassium: 3.4 mmol/L — ABNORMAL LOW (ref 3.5–5.2)
Sodium: 142 mmol/L (ref 134–144)

## 2019-08-18 MED ORDER — ENOXAPARIN SODIUM 80 MG/0.8ML ~~LOC~~ SOLN: 80.0000 mg | SUBCUTANEOUS | 0 refills | Status: DC

## 2019-08-18 NOTE — Patient Instructions (Addendum)
Medication Instructions:  Your physician recommends that you continue on your current medications as directed. Please refer to the Current Medication list given to you today.  *If you need a refill on your cardiac medications before your next appointment, please call your pharmacy*   Lab Work: TODAY: BMET  If you have labs (blood work) drawn today and your tests are completely normal, you will receive your results only by:  [place] (if you have MyChart) OR  A paper copy in the mail If you have any lab test that is abnormal or we need to change your treatment, we will call you to review the results.   Testing/Procedures: Your physician has requested that you have an echocardiogram. Echocardiography is a painless test that uses sound waves to create images of your heart. It provides your doctor with information about the size and shape of your heart and how well your hearts chambers and valves are working. This procedure takes approximately one hour. There are no restrictions for this procedure.     Follow-Up: At [facility], you and your health needs are our priority.  As part of our continuing mission to provide you with exceptional heart care, we have created designated Provider Care Teams.  These Care Teams include your primary Cardiologist (physician) and Advanced Practice Providers (APPs -  Physician Assistants and Nurse Practitioners) who all work together to provide you with the care you need, when you need it.  We recommend signing up for the patient portal called "MyChart".  Sign up information is provided on this After Visit Summary.  MyChart is used to connect with patients for Virtual Visits (Telemedicine).  Patients are able to view lab/test results, encounter notes, upcoming appointments, etc.  Non-urgent messages can be sent to your provider as well.   To learn more about what you can do with MyChart, go to [email or web address].    Your next appointment:    11/18/2019  The format for your next appointment:   In Person  Provider:   Fransico Him, MD   Other Instructions None

## 2019-08-18 NOTE — Progress Notes (Signed)
°  Patient having TKA, unknown surgery date.  Presents today with Susan [name] for clearance.  Patient will require bridging.  Currently takes Eliqus 5mg  2x a day.   Spoke with patient in room and gave instructions.   : 4 days before Surgery: Morning: Last dose of Eliquis.  Evening: Inject Lovenox 80 mg in the fatty abdominal tissue at least 2 inches from the belly once a day at 8pm rotate sites. No Eliquis  : 3 days Before Surgery Morning: No Eliquis.  Evening:  Inject Lovenox 80 mg in the fatty tissue at 8pm. No Eliquis.  : 2 Days Before Surgery Morning: No Eliquis.  Evening Inject Lovenox 80mg  in the fatty tissue at 8pm. No Eliquis.  : 1 Day Before Surgery: No Eliquis or Lovenox  : Day of Surgery - No Lovenox - Resume Eliquis in the evening or as directed by doctor   : Resume Eliquis every 12 hours or as directed by doctor   Printed instructions for patient and printed Lovenox administration instructions for patient to give to granddaughter who will administer.  Patient voiced understanding.    Karren Cobble, PharmD, BCACP, [place] [address] [place], [place] [address] Phone: [phone]; Fax: [phone] 08/18/2019 10:13 AM

## 2019-08-18 NOTE — Patient Instructions (Addendum)
Instructions for holding Eliquis and beginning Lovenox (enoxaparin)    : 4 days before Surgery: Morning: Last dose of Eliquis.  Evening: Inject Lovenox 80 mg in the fatty abdominal tissue at least 2 inches from the belly once a day at 8pm rotate sites. No Eliquis  : 3 days Before Surgery Morning: No Eliquis.  Evening:  Inject Lovenox 80 mg in the fatty tissue at 8pm. No Eliquis.  : 2 Days Before Surgery Morning: No Eliquis.  Evening: Inject Lovenox 80mg  in the fatty tissue at 8pm. No Eliquis.  : 1 Day Before Surgery: No Eliquis or Lovenox  : Day of Surgery - No Lovenox - Resume Eliquis in the evening or as directed by doctor   : 1 Day after Surgery: Resume Eliquis every 12 hours or as directed by Doctor

## 2019-08-21 ENCOUNTER — Encounter: Payer: Self-pay | Admitting: Orthopaedic Surgery

## 2019-08-23 ENCOUNTER — Telehealth: Payer: Self-pay | Admitting: Cardiology

## 2019-08-23 DIAGNOSIS — I5032 Chronic diastolic (congestive) heart failure: Secondary | ICD-10-CM

## 2019-08-23 DIAGNOSIS — I10 Essential (primary) hypertension: Secondary | ICD-10-CM

## 2019-08-23 NOTE — Telephone Encounter (Signed)
Susan Burn, PA-C  08/19/2019 7:23 AM EDT Back to Top    Labs stable Crt 1.44 similar to the past year. Potassium low. On lasix and losartan. Increase diet with potassium rich foods such as bananas, oranges, honeydew, spinach, broccoli, sweet potatoes, zucchini. Recheck bmet in 2 weeks. thanks   The patient has been notified of the result and verbalized understanding.  All questions (if any) were answered. Susan Iba, RN 08/23/2019 5:06 PM  Patient is scheduled for repeat BMET on 08/27, same day as echocardiogram.

## 2019-08-23 NOTE — Telephone Encounter (Signed)
Patient is returning call to discuss results from lab work completed on 08/18/19. Please call.

## 2019-09-03 ENCOUNTER — Other Ambulatory Visit: Payer: Self-pay

## 2019-09-03 ENCOUNTER — Ambulatory Visit (HOSPITAL_COMMUNITY): Payer: Medicare Other | Attending: Cardiovascular Disease

## 2019-09-03 ENCOUNTER — Other Ambulatory Visit: Payer: Medicare Other | Admitting: *Deleted

## 2019-09-03 DIAGNOSIS — I5032 Chronic diastolic (congestive) heart failure: Secondary | ICD-10-CM

## 2019-09-03 DIAGNOSIS — R011 Cardiac murmur, unspecified: Secondary | ICD-10-CM | POA: Insufficient documentation

## 2019-09-03 DIAGNOSIS — I10 Essential (primary) hypertension: Secondary | ICD-10-CM

## 2019-09-03 DIAGNOSIS — Z01810 Encounter for preprocedural cardiovascular examination: Secondary | ICD-10-CM

## 2019-09-03 DIAGNOSIS — Z01818 Encounter for other preprocedural examination: Secondary | ICD-10-CM | POA: Diagnosis not present

## 2019-09-03 LAB — BASIC METABOLIC PANEL
BUN/Creatinine Ratio: 16 (ref 12–28)
BUN: 23 mg/dL (ref 8–27)
CO2: 23 mmol/L (ref 20–29)
Calcium: 10 mg/dL (ref 8.7–10.3)
Chloride: 109 mmol/L — ABNORMAL HIGH (ref 96–106)
Creatinine, Ser: 1.42 mg/dL — ABNORMAL HIGH (ref 0.57–1.00)
GFR calc Af Amer: 40 mL/min/{1.73_m2} — ABNORMAL LOW (ref >59)
GFR calc non Af Amer: 35 mL/min/{1.73_m2} — ABNORMAL LOW (ref >59)
Glucose: 86 mg/dL (ref 65–99)
Potassium: 4.3 mmol/L (ref 3.5–5.2)
Sodium: 144 mmol/L (ref 134–144)

## 2019-09-03 LAB — ECHOCARDIOGRAM COMPLETE
Area-P 1/2: 3.51 cm2
MV M vel: 5.78 m/s
MV Peak grad: 133.6 mmHg
Radius: 0.5 cm
S' Lateral: 3.4 cm

## 2019-09-06 ENCOUNTER — Telehealth: Payer: Self-pay

## 2019-09-06 ENCOUNTER — Telehealth: Payer: Self-pay | Admitting: Cardiology

## 2019-09-06 NOTE — Telephone Encounter (Signed)
Instructions for bridge were provided on 8/11 appointment.

## 2019-09-06 NOTE — Telephone Encounter (Signed)
Surgery Clearance for patient is being faxed today.

## 2019-09-06 NOTE — Telephone Encounter (Signed)
LM for the pt to call back re: her Echo result.   Called Dr. Jamelle Haring office [phone]... LM for the triage nurse re: the pt surgery clearance.   Echo with provider notes for clearance faxed to Dr. Erlinda Hong.

## 2019-09-06 NOTE — Telephone Encounter (Signed)
Patient is calling back for her results 

## 2019-09-06 NOTE — Telephone Encounter (Signed)
Per Dr. Radford Pax, Stable labs - continue current meds.  Per Ermalinda Barrios PA, Heart function normal, heart has some trouble relaxing, trivial heart murmur. Can be cleared for surgery and needs a lovenox bridge. Please let Dr. Eduard Roux know she is cleared.   Called patient back with results written above. Faxed results to Dr. Erlinda Hong office. Will send message to our Pharm D about lovenox bridge.

## 2019-09-09 ENCOUNTER — Telehealth: Payer: Self-pay | Admitting: Orthopaedic Surgery

## 2019-09-09 NOTE — Telephone Encounter (Signed)
Has she taken tramadol before?

## 2019-09-09 NOTE — Telephone Encounter (Signed)
Patient called asked if there is anything she can do to help with the pain in her left knee until she can be scheduled for surgery?  The number to contact patient is [phone]

## 2019-09-09 NOTE — Telephone Encounter (Signed)
She states she has not taking this.

## 2019-09-10 MED ORDER — TRAMADOL HCL 50 MG PO TABS: 50.0000 mg | ORAL_TABLET | Freq: Every day | ORAL | 0 refills | Status: DC | PRN

## 2019-09-24 ENCOUNTER — Other Ambulatory Visit: Payer: Self-pay | Admitting: *Deleted

## 2019-09-24 DIAGNOSIS — Z20822 Contact with and (suspected) exposure to covid-19: Secondary | ICD-10-CM

## 2019-09-27 LAB — NOVEL CORONAVIRUS, NAA: SARS-CoV-2, NAA: NOT DETECTED

## 2019-11-04 ENCOUNTER — Other Ambulatory Visit: Payer: Self-pay

## 2019-11-08 ENCOUNTER — Telehealth: Payer: Self-pay

## 2019-11-08 NOTE — Telephone Encounter (Signed)
Patient called in wanting to know if she should continue to do surgery even though she is having pain wanted to speak about further information.

## 2019-11-08 NOTE — Telephone Encounter (Signed)
States she only has pain when putting pressure. States she is scheduled to have Surgery 12/13/2019. Would like to know if she can still proceed with SU even though she is still having the left leg pain? Not taking anything any pain meds. Would like to discuss with Dr.   Meriel Flavors [phone]

## 2019-11-08 NOTE — Telephone Encounter (Signed)
She should come in for an appt if she is having doubts about surgery.

## 2019-11-08 NOTE — Telephone Encounter (Signed)
Is a message supposed to say should she continue to do surgery even though she is NOT having pain?

## 2019-11-09 NOTE — Telephone Encounter (Signed)
Can you please make her an appt. Thanks.

## 2019-11-17 ENCOUNTER — Ambulatory Visit: Payer: Medicare Other | Admitting: Orthopaedic Surgery

## 2019-11-17 ENCOUNTER — Encounter: Payer: Self-pay | Admitting: Orthopaedic Surgery

## 2019-11-17 VITALS — Ht 62.5 in | Wt 168.2 lb

## 2019-11-17 DIAGNOSIS — M1712 Unilateral primary osteoarthritis, left knee: Secondary | ICD-10-CM | POA: Diagnosis not present

## 2019-11-17 NOTE — Progress Notes (Signed)
Office Visit Note   Patient: Susan [name]           Date of Birth: 1940/10/01           MRN: [id number] Visit Date: 11/17/2019              Requested by: Sonia Side., FNP [place],  [place] [address] PCP: Sonia Side., FNP   Assessment & Plan: Visit Diagnoses:  1. Unilateral primary osteoarthritis, left knee     Plan: Impression is advanced degenerative joint disease left knee with chronic venous stasis left lower extremity and history of CVA and a-fibb  on Eliquis.  Based on her clinical exam today, we feel comfortable proceeding with left total knee replacement.  Risk, benefits and poss complications reviewed.  Rehab recovery time discussed.  All questions were answered.  Total face to face encounter time was greater than 25 minutes and over half of this time was spent in counseling and/or coordination of care.  Follow-Up Instructions: Return for post-op.   Orders:  No orders of the defined types were placed in this encounter.  No orders of the defined types were placed in this encounter.     Procedures: No procedures performed   Clinical Data: No additional findings.   Subjective: Chief Complaint  Patient presents with  . Left Knee - Pain    HPI patient is a pleasant 79 year old female who comes in today with continued left knee pain.  History of advanced degenerative joint disease.  She has failed conservative treatment to include cortisone injections and is scheduled to have an upcoming total knee replacement on 12/13/2019.  She does have an underlying history of CVA currently on Eliquis.  She also has a history of venous stasis and left lower extremity cellulitis.  She has had an ablation to the left lower extremity by Dr. Doren Custard back in June.  She wears compression socks intermittently.  She notes that her swelling and pain to the left lower extremity have not improved over the past several months.  She is concerned with left total knee  replacement for this reason.  Review of Systems as detailed in HPI.  All others reviewed and are negative.   Objective: Vital Signs: Ht 5' 2.5" (1.588 m)   Wt 168 lb 3.2 oz (76.3 kg)   BMI 30.27 kg/m   Physical Exam well-developed well-nourished female no acute distress.  Alert and oriented x3.  Ortho Exam left lower extremity exam shows 1-2+ pitting edema.  She does have moderate tenderness to the left calf.  Negative Homans.  No signs of infection or cellulitis.  Specialty Comments:  No specialty comments available.  Imaging: No new imaging   PMFS History: Patient Active Problem List   Diagnosis Date Noted  . 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 Anemia of chronic disease 09/07/2018  . Unilateral primary osteoarthritis, left knee 06/16/2018  . Venous stasis dermatitis of left lower extremity 04/29/2018  . Dementia ([place]) 03/12/2018  . Recurrent cellulitis 02/12/2018  . HTN (hypertension) 02/12/2018  . Cellulitis and abscess of leg 12/29/2017  . CKD (chronic kidney disease) stage 3, GFR 30-59 ml/min (HCC) 12/29/2017  . Cellulitis of left lower extremity   . Ankle edema   . Sepsis ([place]) 12/21/2017  . Medication management 10/11/2016  . Hyperlipemia 06/18/2016  . Gait abnormality 06/18/2016  . Major depressive disorder, recurrent episode, severe ([place]) 11/21/2015  . Edema 02/09/2015  . History of CVA (  cerebrovascular accident) 02/01/2015  . 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 ([place])   . 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 Chronic low back pain 01/28/2015  . Short-term memory loss 01/28/2015  . Volume depletion 01/28/2015  . Persistent atrial fibrillation 01/26/2015  . DCM (dilated cardiomyopathy) ([place]) 01/26/2015  . TIA (transient ischemic attack) 01/24/2015  . Chronic diastolic (congestive) heart failure ([place])   . Obesity (BMI 30-39.9) 01/27/2013  . Constipation 09/02/2012  . H/O arthroscopy of  right knee 08/06/2012  . Anxiety   . Depression   . GERD (gastroesophageal reflux disease)   . Hypertensive heart disease   . OSA (obstructive sleep apnea)   . Osteoarthritis of right knee   . Hyperparathyroidism, primary ([place]) 04/22/2012  . Chronic cough 10/02/2011   Past Medical History:  Diagnosis Date  . Anxiety   . Arthritis    "legs, back" (07/29/2012)  . Cellulitis and abscess of leg 12/29/2017  . Chronic diastolic (congestive) heart failure ([place])   . Chronic lower back pain   . Complication of anesthesia    "I have apnea" (07/29/2012)  . Dementia ([place]) 03/12/2018  . 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 Family history of anesthesia complication    "some wake up during OR; some are hard to wake up; some both" (07/29/2012)  . GERD (gastroesophageal reflux disease)   . 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 ([place])   . History of CVA (cerebrovascular accident) 02/01/2015  . History of stomach ulcers 1980's  . Hyperlipidemia   . Hyperparathyroidism, primary ([place]) 04/22/2012  . Hypertensive heart disease   . Incontinent of urine    wears pads  . Major depressive disorder, recurrent episode, severe ([place]) 11/21/2015  . OSA on CPAP   . Osteoarthritis of right knee   . Pedal edema   . Persistent atrial fibrillation (HCC)    CHADS2VASC score is 7 - on chronic anticoagulation with Apixaban  . Spinal stenosis   . Umbilical hernia    "unrepaired" (07/29/2012)  . Varicose veins    "BLE" (07/29/2012)  . Vascular dementia ([place])   . Venous stasis dermatitis of left lower extremity 04/29/2018    Family History  Problem Relation Age of Onset  . Hypertension Mother        deceased  . Stroke Mother   . Breast cancer Mother   . Lung cancer Father        deceased  . Diabetes Daughter   . Hypertension Daughter     Past Surgical History:  Procedure Laterality Date  . CATARACT EXTRACTION    . CHOLECYSTECTOMY  ~ 2010  . COLONOSCOPY    . ENDOVENOUS  ABLATION SAPHENOUS VEIN W/ LASER Left 06/24/2019   endovenous laser ablation left greater saphenous vein by Gae Gallop MD   . IUD REMOVAL  [phone]  . PARATHYROIDECTOMY N/A 06/16/2012   Procedure: NECK EXPLORATION AND LEFT SUPERIOR PARATHYROIDECTOMY;  Surgeon: Earnstine Regal, MD;  Location: [facility];  Service: General;  Laterality: N/A;  . 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 TOTAL KNEE ARTHROPLASTY Right 07/29/2012   Procedure: TOTAL KNEE ARTHROPLASTY;  Surgeon: Ninetta Lights, MD;  Location: [facility];  Service: Orthopedics;  Laterality: Right;   Social History   Occupational History  . Occupation: retired    Fish farm manager: RETIRED  Tobacco Use  . Smoking status: Former Smoker    Packs/day: 1.00    Years: 30.00    Pack  years: 30.00    Types: Cigarettes    Quit date: 01/07/1978    Years since quitting: 41.8  . Smokeless tobacco: Never Used  Vaping Use  . Vaping Use: Never used  Substance and Sexual Activity  . Alcohol use: No  . Drug use: No  . Sexual activity: Not Currently    Comment: intercourse age 52,less than 5 secxual partners,

## 2019-11-18 ENCOUNTER — Ambulatory Visit: Payer: Medicare Other | Admitting: Cardiology

## 2019-11-18 LAB — EXTRA LAV TOP TUBE

## 2019-11-18 LAB — PREALBUMIN: Prealbumin: 26 mg/dL (ref 17–34)

## 2019-11-26 ENCOUNTER — Telehealth: Payer: Self-pay | Admitting: Orthopaedic Surgery

## 2019-11-26 NOTE — Telephone Encounter (Signed)
Patient called requesting call back concerning questions of her upcoming surgery. Please call patient at [phone].

## 2019-11-29 ENCOUNTER — Other Ambulatory Visit: Payer: Self-pay

## 2019-11-29 ENCOUNTER — Telehealth: Payer: Self-pay

## 2019-11-29 ENCOUNTER — Inpatient Hospital Stay: Payer: Medicare Other | Attending: Hematology and Oncology

## 2019-11-29 DIAGNOSIS — D638 Anemia in other chronic diseases classified elsewhere: Secondary | ICD-10-CM

## 2019-11-29 DIAGNOSIS — D472 Monoclonal gammopathy: Secondary | ICD-10-CM

## 2019-11-29 LAB — CMP (CANCER CENTER ONLY)
ALT: 12 U/L (ref 0–44)
AST: 19 U/L (ref 15–41)
Albumin: 3.8 g/dL (ref 3.5–5.0)
Alkaline Phosphatase: 75 U/L (ref 38–126)
Anion gap: 7 (ref 5–15)
BUN: 19 mg/dL (ref 8–23)
CO2: 26 mmol/L (ref 22–32)
Calcium: 10.3 mg/dL (ref 8.9–10.3)
Chloride: 110 mmol/L (ref 98–111)
Creatinine: 1.56 mg/dL — ABNORMAL HIGH (ref 0.44–1.00)
GFR, Estimated: 33 mL/min — ABNORMAL LOW (ref >60)
Glucose, Bld: 113 mg/dL — ABNORMAL HIGH (ref 70–99)
Potassium: 3.4 mmol/L — ABNORMAL LOW (ref 3.5–5.1)
Sodium: 143 mmol/L (ref 135–145)
Total Bilirubin: 0.4 mg/dL (ref 0.3–1.2)
Total Protein: 7.7 g/dL (ref 6.5–8.1)

## 2019-11-29 LAB — CBC WITH DIFFERENTIAL (CANCER CENTER ONLY)
Abs Immature Granulocytes: 0.01 10*3/uL (ref 0.00–0.07)
Basophils Absolute: 0.1 10*3/uL (ref 0.0–0.1)
Basophils Relative: 2 %
Eosinophils Absolute: 0.5 10*3/uL (ref 0.0–0.5)
Eosinophils Relative: 13 %
HCT: 33.7 % — ABNORMAL LOW (ref 36.0–46.0)
Hemoglobin: 11.1 g/dL — ABNORMAL LOW (ref 12.0–15.0)
Immature Granulocytes: 0 %
Lymphocytes Relative: 19 %
Lymphs Abs: 0.7 10*3/uL (ref 0.7–4.0)
MCH: 31 pg (ref 26.0–34.0)
MCHC: 32.9 g/dL (ref 30.0–36.0)
MCV: 94.1 fL (ref 80.0–100.0)
Monocytes Absolute: 0.4 10*3/uL (ref 0.1–1.0)
Monocytes Relative: 12 %
Neutro Abs: 1.9 10*3/uL (ref 1.7–7.7)
Neutrophils Relative %: 54 %
Platelet Count: 234 10*3/uL (ref 150–400)
RBC: 3.58 MIL/uL — ABNORMAL LOW (ref 3.87–5.11)
RDW: 12.7 % (ref 11.5–15.5)
WBC Count: 3.6 10*3/uL — ABNORMAL LOW (ref 4.0–10.5)
nRBC: 0 % (ref 0.0–0.2)

## 2019-11-29 NOTE — Telephone Encounter (Signed)
Patient called she would like to know if anything else can be done besides surgery. JO:[phone]

## 2019-11-30 ENCOUNTER — Other Ambulatory Visit: Payer: Self-pay | Admitting: Orthopaedic Surgery

## 2019-11-30 ENCOUNTER — Telehealth: Payer: Self-pay | Admitting: Pharmacist

## 2019-11-30 ENCOUNTER — Telehealth: Payer: Self-pay | Admitting: Orthopaedic Surgery

## 2019-11-30 LAB — MULTIPLE MYELOMA PANEL, SERUM
Albumin SerPl Elph-Mcnc: 3.6 g/dL (ref 2.9–4.4)
Albumin/Glob SerPl: 1.2 (ref 0.7–1.7)
Alpha 1: 0.3 g/dL (ref 0.0–0.4)
Alpha2 Glob SerPl Elph-Mcnc: 0.8 g/dL (ref 0.4–1.0)
B-Globulin SerPl Elph-Mcnc: 1 g/dL (ref 0.7–1.3)
Gamma Glob SerPl Elph-Mcnc: 1.2 g/dL (ref 0.4–1.8)
Globulin, Total: 3.2 g/dL (ref 2.2–3.9)
IgA: 156 mg/dL (ref 64–422)
IgG (Immunoglobin G), Serum: 1268 mg/dL (ref 586–1602)
IgM (Immunoglobulin M), Srm: 61 mg/dL (ref 26–217)
M Protein SerPl Elph-Mcnc: 0.4 g/dL — ABNORMAL HIGH
Total Protein ELP: 6.8 g/dL (ref 6.0–8.5)

## 2019-11-30 LAB — KAPPA/LAMBDA LIGHT CHAINS
Kappa free light chain: 77.4 mg/L — ABNORMAL HIGH (ref 3.3–19.4)
Kappa, lambda light chain ratio: 3.12 — ABNORMAL HIGH (ref 0.26–1.65)
Lambda free light chains: 24.8 mg/L (ref 5.7–26.3)

## 2019-11-30 MED ORDER — APIXABAN 5 MG PO TABS: 5.0000 mg | ORAL_TABLET | Freq: Two times a day (BID) | ORAL | 0 refills | Status: DC

## 2019-11-30 NOTE — Telephone Encounter (Signed)
Please advise 

## 2019-11-30 NOTE — Telephone Encounter (Signed)
Pt called clinic stating her Eliquis is expensive at $120 since she is in the donut hole. She has 2 more weeks worth left at home. Will leave samples up front for pt. She is ok with $47 copay once her plan resets in January. She also stated that her knee surgery was scheduled for 12/6. She will need to hold Eliquis for 3 days prior and will be bridged with Lovenox per prior clearance from 07/13/19. Bridging was actually discussed with pt on 8/11 visit but then procedure was pushed back. She still has 3 Lovenox syringes at home as well.  Pt weighs 75kg at home. Adjusted body weight is 60kg >> CrCl is 35mL/min using SCr of 1.56 drawn yesterday. Pt qualifies for 1mg /kg once daily due to this. Lovenox dose will be 80mg  using actual body weight rounding to nearest syringe size.  Of note, based on SCr of 1.56 yesterday and age of 69, pt would qualify for lower dose of Eliquis 2.5mg  BID. However, all prior SCr were < 1.5. Given this and pt's hx of stroke, will continue on current dose of Eliquis 5mg  BID and monitor closely. If SCr continues to trend > 1.5, then would decrease her dose.  MyChart message for Lovenox bridging instructions below have been sent to pt:  12/2: 4 days before Surgery: Morning: Last dose of Eliquis.  Evening: Inject Lovenox 80 mg in the fatty abdominal tissue at least 2 inches from the belly once a day at 8pm rotate sites. No Eliquis  12/3: 3 days Before Surgery Morning: No Eliquis.  Evening:  Inject Lovenox 80 mg in the fatty tissue at 8pm. No Eliquis.  12/4: 2 Days Before Surgery Morning: No Eliquis.  Evening Inject Lovenox 80mg  in the fatty tissue at 8pm. No Eliquis.  12/5: 1 Day Before Surgery: No Eliquis or Lovenox  12/6: Day of Surgery - No Lovenox - Resume Eliquis in the evening or as directed by doctor   12/7: Resume Eliquis every 12 hours or as directed by doctor

## 2019-11-30 NOTE — Telephone Encounter (Signed)
Spoke to daughter tonight

## 2019-11-30 NOTE — Telephone Encounter (Signed)
Patient's daughter called asked for a call back concerning her mother's surgery. [name] asked how long will her mother be in the hospital and will she be going to a facility for Rehab. [name] said she is trying to get everything done before her mother's surgery. The number to contact Geraldo Pitter is [phone]

## 2019-12-01 NOTE — Telephone Encounter (Signed)
I called patient and discussed going to rehab after surgery.  She has questions about surgery technique.  Please call patient to answer questions about surgery.  Thanks!

## 2019-12-01 NOTE — Telephone Encounter (Signed)
noted 

## 2019-12-02 NOTE — Telephone Encounter (Signed)
Is she a bundle?

## 2019-12-05 NOTE — Assessment & Plan Note (Signed)
Workup performed11/23/2020: 1. CBC with differential : Hemoglobin 11.4, WBC 2.6, ANC 1.3 2. LDH: 198,absolutereticulocyte count: 42.9: No evidence of hemolysis 3.SPEP: M protein 0.4 g IgG kappa 4. Ironstudies:Iron saturation 35%, ferritin 20 5.Erythropoietin 16.2 6.ANA: Negative 6.Hemoglobin electrophoresis: Normal adult hemoglobin Previous work-up with B12 was normal.  Diagnosis: Anemia of chronic disease Current treatment: Watchful monitoring since that hemoglobin is about 10. Lab review 05/27/2019: Hemoglobin 11, MCV 96.9, WBC 2.6, ANC 1.4 I discussed with the patient that the hemoglobin and the white count are stable.  MGUS (monoclonal gammopathy of unknown significance) Work-up for normocytic anemia revealed elevated monoclonal protein:  November 2020: IgG kappa 0.4 g May 2021:Kappa 66.7, ratio 2.81, M protein: Not done  Return to clinic in 1 year with labs done ahead of time and a MyChart virtual visit.

## 2019-12-05 NOTE — Progress Notes (Signed)
HEMATOLOGY-ONCOLOGY MYCHART VIDEO VISIT PROGRESS NOTE  I connected with Susan [name] on 12/06/2019 at  3:00 PM EST by MyChart video conference and verified that I am speaking with the correct person using two identifiers.  I discussed the limitations, risks, security and privacy concerns of performing an evaluation and management service by MyChart and the availability of in person appointments.  I also discussed with the patient that there may be a patient responsible charge related to this service. The patient expressed understanding and agreed to proceed.  Patient's Location: Home Physician Location: Clinic  CHIEF COMPLIANT: Follow-up of anemia andleukopenia  INTERVAL HISTORY: Susan [name] is a 79 y.o. female with above-mentioned history of anemia andleukopenia. She presents over MyChart todayto review her labs.   Patient was not available and therefore I was able to speak with her daughter and provided her with all of this information.  Observations/Objective:  There were no vitals filed for this visit. There is no height or weight on file to calculate BMI.  I have reviewed the data as listed CMP Latest Ref Rng & Units 11/29/2019 09/03/2019 08/18/2019  Glucose 70 - 99 mg/dL 113(H) 86 120(H)  BUN 8 - 23 mg/dL 19 23 24   Creatinine 0.44 - 1.00 mg/dL 1.56(H) 1.42(H) 1.44(H)  Sodium 135 - 145 mmol/L 143 144 142  Potassium 3.5 - 5.1 mmol/L 3.4(L) 4.3 3.4(L)  Chloride 98 - 111 mmol/L 110 109(H) 107(H)  CO2 22 - 32 mmol/L 26 23 21   Calcium 8.9 - 10.3 mg/dL 10.3 10.0 10.3  Total Protein 6.5 - 8.1 g/dL 7.7 - -  Total Bilirubin 0.3 - 1.2 mg/dL 0.4 - -  Alkaline Phos 38 - 126 U/L 75 - -  AST 15 - 41 U/L 19 - -  ALT 0 - 44 U/L 12 - -    Lab Results  Component Value Date   WBC 3.6 (L) 11/29/2019   HGB 11.1 (L) 11/29/2019   HCT 33.7 (L) 11/29/2019   MCV 94.1 11/29/2019   PLT 234 11/29/2019   NEUTROABS 1.9 11/29/2019      Assessment Plan:  MGUS (monoclonal gammopathy of unknown  significance) Workup performed11/23/2020: 1. CBC with differential : Hemoglobin 11.4, WBC 2.6, ANC 1.3 2. LDH: 198,absolutereticulocyte count: 42.9: No evidence of hemolysis 3.SPEP: M protein 0.4 g IgG kappa 4. Ironstudies:Iron saturation 35%, ferritin 20 5.Erythropoietin 16.2 6.ANA: Negative 6.Hemoglobin electrophoresis: Normal adult hemoglobin Previous work-up with B12 was normal.  Diagnosis: Anemia of chronic disease Current treatment: Watchful monitoring since that hemoglobin is about 10. Lab review 11/29/2019: Hemoglobin 11, MCV 94.1, WBC 3.6, ANC 1.9 I discussed with the patient that the hemoglobin and the white count are stable.  MGUS (monoclonal gammopathy of unknown significance) Work-up for normocytic anemia revealed elevated monoclonal protein:  November 2020: IgG kappa 0.4 g November 2021: 0.4 g  Return to clinic in 1 year with labs done ahead of time and a MyChart virtual visit.    I discussed the assessment and treatment plan with the patient. The patient was provided an opportunity to ask questions and all were answered. The patient agreed with the plan and demonstrated an understanding of the instructions. The patient was advised to call back or seek an in-person evaluation if the symptoms worsen or if the condition fails to improve as anticipated.   I provided 20 minutes of face-to-face MyChart video visit time during this encounter.    Rulon Eisenmenger, MD 12/06/2019   Susan [name], am acting as Education administrator  for Nicholas Lose, MD.  I have reviewed the above documentation for accuracy and completeness, and I agree with the above.

## 2019-12-06 ENCOUNTER — Telehealth: Payer: Self-pay

## 2019-12-06 ENCOUNTER — Inpatient Hospital Stay (HOSPITAL_BASED_OUTPATIENT_CLINIC_OR_DEPARTMENT_OTHER): Payer: Medicare Other | Admitting: Hematology and Oncology

## 2019-12-06 DIAGNOSIS — D472 Monoclonal gammopathy: Secondary | ICD-10-CM

## 2019-12-06 NOTE — Telephone Encounter (Signed)
Cortisone or visco injections

## 2019-12-06 NOTE — Telephone Encounter (Signed)
Patient called in wanting to as about having alternatives than having knee replacement . Also wanted to ask about rehab .

## 2019-12-06 NOTE — Telephone Encounter (Signed)
Can you advise on alternatives?

## 2019-12-07 NOTE — Telephone Encounter (Signed)
no

## 2019-12-07 NOTE — Telephone Encounter (Signed)
I called and lm on vm to advise of message below.  

## 2019-12-07 NOTE — Progress Notes (Signed)
[facility]- [place], [place] - [address] [place] [address] Phone: [phone] Fax: [phone]  [facility] - [place], [place] - [address] [place] [address] Phone: [phone] Fax: [phone]      Your procedure is scheduled on Monday, December 6th.  Report to [facility] Main Entrance "A" at 10:30 A.M., and check in at the Admitting office.  Call this number if you have problems the morning of surgery:  [phone]  Call [phone] if you have any questions prior to your surgery date Monday-Friday 8am-4pm    Remember:  Do not eat after midnight the night before your surgery  You may drink clear liquids until 9:30 AM the morning of your surgery.   Clear liquids allowed are: Water, Non-Citrus Juices (without pulp), Carbonated Beverages, Clear Tea, Black Coffee Only, and Gatorade    Take these medicines the morning of surgery with A SIP OF WATER   Tylenol - if needed  Nasal spray - if needed  Wellbutrin  Buspirone (Buspar)  Carvedilol (Coreg)  Donepezil (Aricept)  Pantoprazole (Protonix)  Pravastatin (Pravachol)  Sertraline (Zoloft)   Follow your surgeon's instructions on when to stop Eliquis.  If no instructions were given by your surgeon then you will need to call the office to get those instructions.    As of today, STOP taking any Aspirin (unless otherwise instructed by your surgeon) Aleve, Naproxen, Ibuprofen, Motrin, Advil, Goody's, BC's, all herbal medications, fish oil, and all vitamins.                      Do not wear jewelry, make up, or nail polish            Do not wear lotions, powders, perfumes, or deodorant.            Do not shave 48 hours prior to surgery.             Do not bring valuables to the hospital.            [facility] is not responsible for any belongings or valuables.  Do NOT Smoke (Tobacco/Vaping) or drink Alcohol 24 hours prior to your  procedure If you use a CPAP at night, you may bring all equipment for your overnight stay.   Contacts, glasses, dentures or bridgework may not be worn into surgery.      For patients admitted to the hospital, discharge time will be determined by your treatment team.   Patients discharged the day of surgery will not be allowed to drive home, and someone needs to stay with them for 24 hours.    Special instructions:   [facility]- Preparing For Surgery  Before surgery, you can play an important role. Because skin is not sterile, your skin needs to be as free of germs as possible. You can reduce the number of germs on your skin by washing with CHG (chlorahexidine gluconate) Soap before surgery.  CHG is an antiseptic cleaner which kills germs and bonds with the skin to continue killing germs even after washing.    Oral Hygiene is also important to reduce your risk of infection.  Remember - BRUSH YOUR TEETH THE MORNING OF SURGERY WITH YOUR REGULAR TOOTHPASTE  Please do not use if you have an allergy to CHG or antibacterial soaps. If your skin becomes reddened/irritated stop using the CHG.  Do not shave (including legs and underarms) for at  least 48 hours prior to first CHG shower. It is OK to shave your face.  Please follow these instructions carefully.   1. Shower the NIGHT BEFORE SURGERY and the MORNING OF SURGERY with CHG Soap.   2. If you chose to wash your hair, wash your hair first as usual with your normal shampoo.  3. After you shampoo, rinse your hair and body thoroughly to remove the shampoo.  4. Use CHG as you would any other liquid soap. You can apply CHG directly to the skin and wash gently with a scrungie or a clean washcloth.   5. Apply the CHG Soap to your body ONLY FROM THE NECK DOWN.  Do not use on open wounds or open sores. Avoid contact with your eyes, ears, mouth and genitals (private parts). Wash Face and genitals (private parts)  with your normal soap.   6. Wash  thoroughly, paying special attention to the area where your surgery will be performed.  7. Thoroughly rinse your body with warm water from the neck down.  8. DO NOT shower/wash with your normal soap after using and rinsing off the CHG Soap.  9. Pat yourself dry with a CLEAN TOWEL.  10. Wear CLEAN PAJAMAS to bed the night before surgery  11. Place CLEAN SHEETS on your bed the night of your first shower and DO NOT SLEEP WITH PETS.   Day of Surgery: Wear Clean/Comfortable clothing the morning of surgery Do not apply any deodorants/lotions.   Remember to brush your teeth WITH YOUR REGULAR TOOTHPASTE.   Please read over the following fact sheets that you were given.

## 2019-12-08 ENCOUNTER — Encounter (HOSPITAL_COMMUNITY)
Admission: RE | Admit: 2019-12-08 | Discharge: 2019-12-08 | Disposition: A | Discharge status: Home / Self Care | Payer: Medicare Other | Source: Ambulatory Visit | Attending: Physician Assistant | Admitting: Physician Assistant

## 2019-12-08 ENCOUNTER — Encounter (HOSPITAL_COMMUNITY)
Admission: RE | Admit: 2019-12-08 | Discharge: 2019-12-08 | Disposition: A | Discharge status: Home / Self Care | Payer: Medicare Other | Source: Ambulatory Visit | Attending: Orthopaedic Surgery | Admitting: Orthopaedic Surgery

## 2019-12-08 ENCOUNTER — Other Ambulatory Visit: Payer: Self-pay

## 2019-12-08 ENCOUNTER — Encounter (HOSPITAL_COMMUNITY): Payer: Self-pay

## 2019-12-08 ENCOUNTER — Telehealth: Payer: Self-pay | Admitting: Hematology and Oncology

## 2019-12-08 DIAGNOSIS — Z7901 Long term (current) use of anticoagulants: Secondary | ICD-10-CM | POA: Diagnosis not present

## 2019-12-08 DIAGNOSIS — M1712 Unilateral primary osteoarthritis, left knee: Secondary | ICD-10-CM | POA: Diagnosis not present

## 2019-12-08 DIAGNOSIS — I5032 Chronic diastolic (congestive) heart failure: Secondary | ICD-10-CM | POA: Insufficient documentation

## 2019-12-08 DIAGNOSIS — I129 Hypertensive chronic kidney disease with stage 1 through stage 4 chronic kidney disease, or unspecified chronic kidney disease: Secondary | ICD-10-CM | POA: Insufficient documentation

## 2019-12-08 DIAGNOSIS — E785 Hyperlipidemia, unspecified: Secondary | ICD-10-CM | POA: Diagnosis not present

## 2019-12-08 DIAGNOSIS — Z01818 Encounter for other preprocedural examination: Secondary | ICD-10-CM | POA: Diagnosis present

## 2019-12-08 DIAGNOSIS — N183 Chronic kidney disease, stage 3 unspecified: Secondary | ICD-10-CM | POA: Insufficient documentation

## 2019-12-08 DIAGNOSIS — I4819 Other persistent atrial fibrillation: Secondary | ICD-10-CM | POA: Insufficient documentation

## 2019-12-08 DIAGNOSIS — G4733 Obstructive sleep apnea (adult) (pediatric): Secondary | ICD-10-CM | POA: Insufficient documentation

## 2019-12-08 LAB — SURGICAL PCR SCREEN
MRSA, PCR: NEGATIVE
Staphylococcus aureus: NEGATIVE

## 2019-12-08 LAB — CBC WITH DIFFERENTIAL/PLATELET
Abs Immature Granulocytes: 0.01 10*3/uL (ref 0.00–0.07)
Basophils Absolute: 0.1 10*3/uL (ref 0.0–0.1)
Basophils Relative: 2 %
Eosinophils Absolute: 0.5 10*3/uL (ref 0.0–0.5)
Eosinophils Relative: 13 %
HCT: 35.6 % — ABNORMAL LOW (ref 36.0–46.0)
Hemoglobin: 11.2 g/dL — ABNORMAL LOW (ref 12.0–15.0)
Immature Granulocytes: 0 %
Lymphocytes Relative: 22 %
Lymphs Abs: 0.8 10*3/uL (ref 0.7–4.0)
MCH: 30.5 pg (ref 26.0–34.0)
MCHC: 31.5 g/dL (ref 30.0–36.0)
MCV: 97 fL (ref 80.0–100.0)
Monocytes Absolute: 0.5 10*3/uL (ref 0.1–1.0)
Monocytes Relative: 15 %
Neutro Abs: 1.8 10*3/uL (ref 1.7–7.7)
Neutrophils Relative %: 48 %
Platelets: 230 10*3/uL (ref 150–400)
RBC: 3.67 MIL/uL — ABNORMAL LOW (ref 3.87–5.11)
RDW: 12.9 % (ref 11.5–15.5)
WBC: 3.7 10*3/uL — ABNORMAL LOW (ref 4.0–10.5)
nRBC: 0 % (ref 0.0–0.2)

## 2019-12-08 LAB — COMPREHENSIVE METABOLIC PANEL
ALT: 17 U/L (ref 0–44)
AST: 22 U/L (ref 15–41)
Albumin: 3.7 g/dL (ref 3.5–5.0)
Alkaline Phosphatase: 67 U/L (ref 38–126)
Anion gap: 10 (ref 5–15)
BUN: 21 mg/dL (ref 8–23)
CO2: 27 mmol/L (ref 22–32)
Calcium: 10.6 mg/dL — ABNORMAL HIGH (ref 8.9–10.3)
Chloride: 106 mmol/L (ref 98–111)
Creatinine, Ser: 1.5 mg/dL — ABNORMAL HIGH (ref 0.44–1.00)
GFR, Estimated: 35 mL/min — ABNORMAL LOW (ref >60)
Glucose, Bld: 91 mg/dL (ref 70–99)
Potassium: 3.7 mmol/L (ref 3.5–5.1)
Sodium: 143 mmol/L (ref 135–145)
Total Bilirubin: 0.6 mg/dL (ref 0.3–1.2)
Total Protein: 7.1 g/dL (ref 6.5–8.1)

## 2019-12-08 LAB — URINALYSIS, ROUTINE W REFLEX MICROSCOPIC
Bacteria, UA: NONE SEEN
Bilirubin Urine: NEGATIVE
Glucose, UA: NEGATIVE mg/dL
Hgb urine dipstick: NEGATIVE
Ketones, ur: NEGATIVE mg/dL
Nitrite: NEGATIVE
Protein, ur: NEGATIVE mg/dL
Specific Gravity, Urine: 1.018 (ref 1.005–1.030)
pH: 5 (ref 5.0–8.0)

## 2019-12-08 LAB — TYPE AND SCREEN
ABO/RH(D): B POS
Antibody Screen: NEGATIVE

## 2019-12-08 LAB — PROTIME-INR
INR: 1.2 (ref 0.8–1.2)
Prothrombin Time: 14.8 seconds (ref 11.4–15.2)

## 2019-12-08 LAB — APTT: aPTT: 31 seconds (ref 24–36)

## 2019-12-08 NOTE — Progress Notes (Signed)
PCP - Dr. Dustin Folks Cardiologist - Dr. Fransico Him Oncologist- Dr. Nicholas Lose  Chest x-ray - 12/08/19 EKG - 08/18/19 Stress Test - 04/14/13 ECHO - 09/03/19 Cardiac Cath - denies  Sleep Study - + OSA CPAP - uses QHS- does not know pressure settings   Blood Thinner Instructions: hold Eliquis 3 days prior to surgery- Lovenox bridge  Aspirin Instructions: Patient instructed to hold all Aspirin, NSAID's, herbal medications, fish oil and vitamins 7 days prior to surgery.   ERAS Protcol - clear liquids until 3 hours prior to surgery PRE-SURGERY Ensure or G2- Ensure- complete by 0930 DOS  COVID TEST- 12/10/19 at at [facility]. Pt instructed to remain in their car. Educated on Transport planner until [employer].    Anesthesia review: cardiac history  Patient denies shortness of breath, fever, cough and chest pain at PAT appointment   All instructions explained to the patient, with a verbal understanding of the material. Patient agrees to go over the instructions while at home for a better understanding. Patient also instructed to self quarantine after being tested for COVID-19. The opportunity to ask questions was provided.

## 2019-12-08 NOTE — Telephone Encounter (Signed)
Scheduled per 11/29 los. Called and spoke with pt, confirmed 11/22 and 11/29 appts

## 2019-12-08 NOTE — Progress Notes (Signed)
[facility]- [place], [place] - [address] [place] [address] Phone: [phone] Fax: [phone]  [facility] - [place], [place] - [address] [place] [address] Phone: [phone] Fax: [phone]      Your procedure is scheduled on Monday, December 6th.  Report to [facility] Main Entrance "A" at 10:30 A.M., and check in at the Admitting office.  Call this number if you have problems the morning of surgery:  [phone]  Call [phone] if you have any questions prior to your surgery date Monday-Friday 8am-4pm    Remember:  Do not eat after midnight the night before your surgery  You may drink clear liquids until 9:30 AM the morning of your surgery.   Clear liquids allowed are: Water, Non-Citrus Juices (without pulp), Carbonated Beverages, Clear Tea, Black Coffee Only, and Gatorade  Patient Instructions  . The night before surgery:  o No food after midnight. ONLY clear liquids after midnight   . The day of surgery (if you do NOT have diabetes):  o Drink ONE (1) Pre-Surgery Clear Ensure as directed.   o This drink was given to you during your hospital  pre-op appointment visit. o The pre-op nurse will instruct you on the time to drink the  Pre-Surgery Ensure depending on your surgery time. o Finish the drink by 9:30 AM the morning yof your surgery.  o Nothing else to drink after completing the  Pre-Surgery Clear Ensure.    Take these medicines the morning of surgery with A SIP OF WATER   Tylenol - if needed  Nasal spray - if needed  Wellbutrin  Buspirone (Buspar)  Carvedilol (Coreg)  Donepezil (Aricept)  Pantoprazole (Protonix)  Pravastatin (Pravachol)  Sertraline (Zoloft)   Follow your surgeon's instructions on when to stop Eliquis.  If no instructions were given by your surgeon then you will need to call the office to get those instructions.    As of today, STOP  taking any Aspirin (unless otherwise instructed by your surgeon) Aleve, Naproxen, Ibuprofen, Motrin, Advil, Goody's, BC's, all herbal medications, fish oil, and all vitamins.                      Do not wear jewelry, make up, or nail polish            Do not wear lotions, powders, perfumes, or deodorant.            Do not shave 48 hours prior to surgery.             Do not bring valuables to the hospital.            [facility] is not responsible for any belongings or valuables.  Do NOT Smoke (Tobacco/Vaping) or drink Alcohol 24 hours prior to your procedure If you use a CPAP at night, you may bring all equipment for your overnight stay.   Contacts, glasses, dentures or bridgework may not be worn into surgery.      For patients admitted to the hospital, discharge time will be determined by your treatment team.   Patients discharged the day of surgery will not be allowed to drive home, and someone needs to stay with them for 24 hours.    Special instructions:   [facility]- Preparing For Surgery  Before surgery, you can play an important role. Because skin is not sterile, your skin needs to be as free of  germs as possible. You can reduce the number of germs on your skin by washing with CHG (chlorahexidine gluconate) Soap before surgery.  CHG is an antiseptic cleaner which kills germs and bonds with the skin to continue killing germs even after washing.    Oral Hygiene is also important to reduce your risk of infection.  Remember - BRUSH YOUR TEETH THE MORNING OF SURGERY WITH YOUR REGULAR TOOTHPASTE  Please do not use if you have an allergy to CHG or antibacterial soaps. If your skin becomes reddened/irritated stop using the CHG.  Do not shave (including legs and underarms) for at least 48 hours prior to first CHG shower. It is OK to shave your face.  Please follow these instructions carefully.   1. Shower the NIGHT BEFORE SURGERY and the MORNING OF SURGERY with CHG Soap.   2. If you  chose to wash your hair, wash your hair first as usual with your normal shampoo.  3. After you shampoo, rinse your hair and body thoroughly to remove the shampoo.  4. Use CHG as you would any other liquid soap. You can apply CHG directly to the skin and wash gently with a scrungie or a clean washcloth.   5. Apply the CHG Soap to your body ONLY FROM THE NECK DOWN.  Do not use on open wounds or open sores. Avoid contact with your eyes, ears, mouth and genitals (private parts). Wash Face and genitals (private parts)  with your normal soap.   6. Wash thoroughly, paying special attention to the area where your surgery will be performed.  7. Thoroughly rinse your body with warm water from the neck down.  8. DO NOT shower/wash with your normal soap after using and rinsing off the CHG Soap.  9. Pat yourself dry with a CLEAN TOWEL.  10. Wear CLEAN PAJAMAS to bed the night before surgery  11. Place CLEAN SHEETS on your bed the night of your first shower and DO NOT SLEEP WITH PETS.   Day of Surgery: Wear Clean/Comfortable clothing the morning of surgery Do not apply any deodorants/lotions.   Remember to brush your teeth WITH YOUR REGULAR TOOTHPASTE.   Please read over the following fact sheets that you were given.

## 2019-12-09 ENCOUNTER — Other Ambulatory Visit (HOSPITAL_COMMUNITY): Payer: Medicare Other

## 2019-12-09 NOTE — Progress Notes (Signed)
Anesthesia Chart Review:  History chronic diastolic CHF, persistent Afib on Eliquis, hypertension, HTN, HLD, OSA on CPAP.  Followed by Dr. Scot Dock for PVD. Last seen by cardiology 08/18/19 for preop clearance. Per note, "Preoperative clearance before undergoing left knee surgery by Dr. Eduard Roux.  CHA2DS2-VASc equals 7 so will need a Lovenox bridge.  Pharmacy working with patient now.  Cardiac risk index is elevated at 6.6 but METs is over 4.  She has persistent atrial fibrillation, chronic diastolic CHF.  No history of CAD.  She is asymptomatic.  New right bundle branch block on EKG but previous conduction delay.  Reviewed with Dr. Marlou Porch.  No need for ischemic work-up before being cleared for surgery.  She does have a new heart murmur and will order 2D echo.  If this is stable we will clear her for surgery.  According to the Revised Cardiac Risk Index (RCRI), her Perioperative Risk of Major Cardiac Event is (%): 6.6. Her Functional Capacity in METs is: 4.36 according to the Duke Activity Status Index (DASI)."  Subsequent echo 09/03/19 was essentially normal, Ermalinda Barrios PA-C commented stating, "Heart function normal, heart has some trouble relaxing, trivial heart murmur. Can be cleared for surgery and needs a lovenox bridge. Please let Dr. Eduard Roux know she is cleared."  Pt is on Lovenox bridge per pharmacy note 11/30/19.   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 Currently watchful waiting.   Hx of CKD 3.  OSA on CPAP.   Preop labs reviewed, creatinine 1.56 consistent with CKD 3, mild anemia with Hgb 11.2.  EKG 08/18/19: Afib, RBBB, LVH. Rate 75.   CHEST - 2 VIEW 12/08/19: COMPARISON:  12/29/2017.  FINDINGS: Enlargement of the cardiac silhouette. Tortuous aorta with calcific atherosclerosis. Both lungs are clear. No visible pleural effusions or pneumothorax. No acute osseous abnormality. Polyarticular degenerative change.  IMPRESSION: 1. No acute cardiopulmonary  disease. 2. Cardiomegaly.  TTE 09/03/19: 1. Left ventricular ejection fraction, by estimation, is 55 to 60%. The  left ventricle has normal function. The left ventricle has no regional  wall motion abnormalities. There is mild concentric left ventricular  hypertrophy. Left ventricular diastolic  parameters are consistent with Grade I diastolic dysfunction (impaired  relaxation). Elevated left ventricular end-diastolic pressure.  2. Right ventricular systolic function is normal. The right ventricular  size is normal. There is normal pulmonary artery systolic pressure.  3. Left atrial size was moderately dilated.  4. The mitral valve is normal in structure. Trivial mitral valve  regurgitation. No evidence of mitral stenosis.  5. The aortic valve is normal in structure. Aortic valve regurgitation is  not visualized. No aortic stenosis is present.  6. The inferior vena cava is normal in size with greater than 50%  respiratory variability, suggesting right atrial pressure of 3 mmHg.   Nuclear stress test 2015: Impression Exercise Capacity: Lexiscan with no exercise. BP Response: Normal blood pressure response. Clinical Symptoms: There is dyspnea. ECG Impression: No significant ST segment change suggestive of ischemia. Comparison with Prior Nuclear Study: No previous nuclear study performed  Overall Impression: Normal stress nuclear study.  LV Ejection Fraction: 53%. LV Wall Motion: NL LV Function; NL Wall Motion    Wynonia Musty [facility] Stay [facility]/Anesthesiology Phone [phone] 12/09/2019 1:10 PM

## 2019-12-09 NOTE — Anesthesia Preprocedure Evaluation (Addendum)
Anesthesia Evaluation  Patient identified by MRN, date of birth, ID band Patient awake    Reviewed: Allergy & Precautions, NPO status , Patient's Chart, lab work & pertinent test results  Airway Mallampati: III  TM Distance: >3 FB Neck ROM: Full    Dental  (+) Poor Dentition, Missing   Pulmonary sleep apnea and Continuous Positive Airway Pressure Ventilation , former smoker,    Pulmonary exam normal breath sounds clear to auscultation       Cardiovascular hypertension, Pt. on home beta blockers and Pt. on medications +CHF  Normal cardiovascular exam+ dysrhythmias Atrial Fibrillation  Rhythm:Regular Rate:Normal     Neuro/Psych PSYCHIATRIC DISORDERS Anxiety Depression Dementia TIACVA, Residual Symptoms    GI/Hepatic Neg liver ROS, GERD  Medicated and Controlled,  Endo/Other  negative endocrine ROS  Renal/GU Renal InsufficiencyRenal disease     Musculoskeletal  (+) Arthritis , Spinal stenosis Chronic lower back pain   Abdominal (+) + obese,   Peds  Hematology  (+) anemia ,   Anesthesia Other Findings left knee degenerative joint disease  Reproductive/Obstetrics                           Anesthesia Physical Anesthesia Plan  ASA: III  Anesthesia Plan: Spinal and Regional   Post-op Pain Management:  Regional for Post-op pain   Induction: Intravenous  PONV Risk Score and Plan: 2 and Ondansetron, Dexamethasone, Propofol infusion and Treatment may vary due to age or medical condition  Airway Management Planned: Simple Face Mask  Additional Equipment:   Intra-op Plan:   Post-operative Plan:   Informed Consent: I have reviewed the patients History and Physical, chart, labs and discussed the procedure including the risks, benefits and alternatives for the proposed anesthesia with the patient or authorized representative who has indicated his/her understanding and acceptance.     Dental  advisory given  Plan Discussed with: CRNA  Anesthesia Plan Comments: (PAT note by Karoline Caldwell, PA-C: History chronic diastolic CHF, persistent Afib on Eliquis, hypertension, HTN, HLD, OSA on CPAP.  Followed by Dr. Scot Dock for PVD. Last seen by cardiology 08/18/19 for preop clearance. Per note, "Preoperative clearance before undergoing left knee surgery by Dr. Eduard Roux.  CHA2DS2-VASc equals 7 so will need a Lovenox bridge.  Pharmacy working with patient now.  Cardiac risk index is elevated at 6.6 but METs is over 4.  She has persistent atrial fibrillation, chronic diastolic CHF.  No history of CAD.  She is asymptomatic.  New right bundle branch block on EKG but previous conduction delay.  Reviewed with Dr. Marlou Porch.  No need for ischemic work-up before being cleared for surgery.  She does have a new heart murmur and will order 2D echo.  If this is stable we will clear her for surgery.  According to the Revised Cardiac Risk Index (RCRI), her Perioperative Risk of Major Cardiac Event is (%): 6.6. Her Functional Capacity in METs is: 4.36 according to the Duke Activity Status Index (DASI)."  Subsequent echo 09/03/19 was essentially normal, Ermalinda Barrios PA-C commented stating, "Heart function normal, heart has some trouble relaxing, trivial heart murmur. Can be cleared for surgery and needs a lovenox bridge. Please let Dr. Eduard Roux know she is cleared."  Pt is on Lovenox bridge per pharmacy note 11/30/19.   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 Currently watchful waiting.   Hx of CKD 3.  OSA on CPAP.   Preop labs reviewed, creatinine 1.56 consistent  with CKD 3, mild anemia with Hgb 11.2.  EKG 08/18/19: Afib, RBBB, LVH. Rate 75.   CHEST - 2 VIEW 12/08/19: COMPARISON:  12/29/2017.  FINDINGS: Enlargement of the cardiac silhouette. Tortuous aorta with calcific atherosclerosis. Both lungs are clear. No visible pleural effusions or pneumothorax. No acute osseous abnormality.  Polyarticular degenerative change.  IMPRESSION: 1. No acute cardiopulmonary disease. 2. Cardiomegaly.  TTE 09/03/19: 1. Left ventricular ejection fraction, by estimation, is 55 to 60%. The  left ventricle has normal function. The left ventricle has no regional  wall motion abnormalities. There is mild concentric left ventricular  hypertrophy. Left ventricular diastolic  parameters are consistent with Grade I diastolic dysfunction (impaired  relaxation). Elevated left ventricular end-diastolic pressure.  2. Right ventricular systolic function is normal. The right ventricular  size is normal. There is normal pulmonary artery systolic pressure.  3. Left atrial size was moderately dilated.  4. The mitral valve is normal in structure. Trivial mitral valve  regurgitation. No evidence of mitral stenosis.  5. The aortic valve is normal in structure. Aortic valve regurgitation is  not visualized. No aortic stenosis is present.  6. The inferior vena cava is normal in size with greater than 50%  respiratory variability, suggesting right atrial pressure of 3 mmHg.   Nuclear stress test 2015: Impression Exercise Capacity: Lexiscan with no exercise. BP Response: Normal blood pressure response. Clinical Symptoms: There is dyspnea. ECG Impression: No significant ST segment change suggestive of ischemia. Comparison with Prior Nuclear Study: No previous nuclear study performed  Overall Impression: Normal stress nuclear study.  LV Ejection Fraction: 53%. LV Wall Motion: NL LV Function; NL Wall Motion )       Anesthesia Quick Evaluation

## 2019-12-10 ENCOUNTER — Other Ambulatory Visit (HOSPITAL_COMMUNITY)
Admission: RE | Admit: 2019-12-10 | Discharge: 2019-12-10 | Disposition: A | Discharge status: Home / Self Care | Payer: Medicare Other | Source: Ambulatory Visit | Attending: Orthopaedic Surgery | Admitting: Orthopaedic Surgery

## 2019-12-10 DIAGNOSIS — Z01812 Encounter for preprocedural laboratory examination: Secondary | ICD-10-CM | POA: Diagnosis present

## 2019-12-10 DIAGNOSIS — Z20822 Contact with and (suspected) exposure to covid-19: Secondary | ICD-10-CM | POA: Insufficient documentation

## 2019-12-10 LAB — SARS CORONAVIRUS 2 (TAT 6-24 HRS): SARS Coronavirus 2: NEGATIVE

## 2019-12-10 MED ORDER — TRANEXAMIC ACID 1000 MG/10ML IV SOLN
2000.0000 mg | INTRAVENOUS | Status: AC
  Administered 2019-12-13: 2000 mg via TOPICAL
  Filled 2019-12-10: qty 20

## 2019-12-10 MED ORDER — BUPIVACAINE LIPOSOME 1.3 % IJ SUSP
20.0000 mL | INTRAMUSCULAR | Status: AC
  Administered 2019-12-13: 20 mL
  Filled 2019-12-10: qty 20

## 2019-12-13 ENCOUNTER — Ambulatory Visit (HOSPITAL_COMMUNITY): Payer: Medicare Other | Admitting: Anesthesiology

## 2019-12-13 ENCOUNTER — Other Ambulatory Visit: Payer: Self-pay

## 2019-12-13 ENCOUNTER — Observation Stay (HOSPITAL_COMMUNITY): Payer: Medicare Other

## 2019-12-13 ENCOUNTER — Encounter (HOSPITAL_COMMUNITY)
Admission: RE | Disposition: A | Discharge status: Home / Self Care | Payer: Self-pay | Source: Home / Self Care | Attending: Orthopaedic Surgery

## 2019-12-13 ENCOUNTER — Encounter (HOSPITAL_COMMUNITY): Payer: Self-pay | Admitting: Orthopaedic Surgery

## 2019-12-13 ENCOUNTER — Other Ambulatory Visit: Payer: Self-pay | Admitting: Physician Assistant

## 2019-12-13 ENCOUNTER — Ambulatory Visit (HOSPITAL_COMMUNITY): Payer: Medicare Other | Admitting: Physician Assistant

## 2019-12-13 ENCOUNTER — Observation Stay (HOSPITAL_COMMUNITY)
Admission: RE | Admit: 2019-12-13 | Discharge: 2019-12-16 | Disposition: A | Discharge status: Home / Self Care | Payer: Medicare Other | Attending: Orthopaedic Surgery | Admitting: Orthopaedic Surgery

## 2019-12-13 DIAGNOSIS — I5032 Chronic diastolic (congestive) heart failure: Secondary | ICD-10-CM | POA: Diagnosis not present

## 2019-12-13 DIAGNOSIS — Z96651 Presence of right artificial knee joint: Secondary | ICD-10-CM | POA: Diagnosis not present

## 2019-12-13 DIAGNOSIS — M1712 Unilateral primary osteoarthritis, left knee: Principal | ICD-10-CM

## 2019-12-13 DIAGNOSIS — Z87891 Personal history of nicotine dependence: Secondary | ICD-10-CM | POA: Insufficient documentation

## 2019-12-13 DIAGNOSIS — Z91040 Latex allergy status: Secondary | ICD-10-CM | POA: Insufficient documentation

## 2019-12-13 DIAGNOSIS — Z79899 Other long term (current) drug therapy: Secondary | ICD-10-CM | POA: Diagnosis not present

## 2019-12-13 DIAGNOSIS — Z7901 Long term (current) use of anticoagulants: Secondary | ICD-10-CM | POA: Insufficient documentation

## 2019-12-13 DIAGNOSIS — Z96652 Presence of left artificial knee joint: Secondary | ICD-10-CM

## 2019-12-13 DIAGNOSIS — I110 Hypertensive heart disease with heart failure: Secondary | ICD-10-CM | POA: Diagnosis not present

## 2019-12-13 HISTORY — PX: TOTAL KNEE ARTHROPLASTY: SHX125

## 2019-12-13 SURGERY — ARTHROPLASTY, KNEE, TOTAL
Anesthesia: Regional | Site: Knee | Laterality: Left

## 2019-12-13 MED ORDER — MIDAZOLAM HCL 2 MG/2ML IJ SOLN
INTRAMUSCULAR | Status: AC
  Filled 2019-12-13: qty 2

## 2019-12-13 MED ORDER — ACETAMINOPHEN 500 MG PO TABS
1000.0000 mg | ORAL_TABLET | Freq: Four times a day (QID) | ORAL | Status: AC
  Administered 2019-12-13 – 2019-12-14 (×4): 1000 mg via ORAL
  Filled 2019-12-13 (×4): qty 2

## 2019-12-13 MED ORDER — TRANEXAMIC ACID-NACL 1000-0.7 MG/100ML-% IV SOLN
1000.0000 mg | Freq: Once | INTRAVENOUS | Status: AC
  Administered 2019-12-13: 1000 mg via INTRAVENOUS
  Filled 2019-12-13: qty 100

## 2019-12-13 MED ORDER — BUPROPION HCL ER (XL) 150 MG PO TB24
150.0000 mg | ORAL_TABLET | Freq: Every day | ORAL | Status: DC
  Administered 2019-12-13 – 2019-12-16 (×4): 150 mg via ORAL
  Filled 2019-12-13 (×4): qty 1

## 2019-12-13 MED ORDER — DEXAMETHASONE SODIUM PHOSPHATE 10 MG/ML IJ SOLN
10.0000 mg | Freq: Once | INTRAMUSCULAR | Status: AC
  Administered 2019-12-14: 10 mg via INTRAVENOUS
  Filled 2019-12-13: qty 1

## 2019-12-13 MED ORDER — POVIDONE-IODINE 10 % EX SWAB: 2.0000 "application " | Freq: Once | CUTANEOUS | Status: DC

## 2019-12-13 MED ORDER — POLYETHYLENE GLYCOL 3350 17 G PO PACK: 17.0000 g | PACK | Freq: Every day | ORAL | Status: DC | PRN

## 2019-12-13 MED ORDER — ONDANSETRON HCL 4 MG/2ML IJ SOLN: 4.0000 mg | Freq: Once | INTRAMUSCULAR | Status: DC | PRN

## 2019-12-13 MED ORDER — METOCLOPRAMIDE HCL 5 MG/ML IJ SOLN: 5.0000 mg | Freq: Three times a day (TID) | INTRAMUSCULAR | Status: DC | PRN

## 2019-12-13 MED ORDER — DONEPEZIL HCL 10 MG PO TABS
10.0000 mg | ORAL_TABLET | Freq: Every day | ORAL | Status: DC
  Administered 2019-12-13 – 2019-12-16 (×4): 10 mg via ORAL
  Filled 2019-12-13 (×4): qty 1

## 2019-12-13 MED ORDER — ORAL CARE MOUTH RINSE: 15.0000 mL | Freq: Once | OROMUCOSAL | Status: AC

## 2019-12-13 MED ORDER — SORBITOL 70 % SOLN
30.0000 mL | Freq: Every day | Status: DC | PRN
  Filled 2019-12-13: qty 30

## 2019-12-13 MED ORDER — BUPIVACAINE IN DEXTROSE 0.75-8.25 % IT SOLN
INTRATHECAL | Status: DC | PRN
  Administered 2019-12-13: 1.6 mL via INTRATHECAL

## 2019-12-13 MED ORDER — OXYCODONE HCL 5 MG PO TABS: 5.0000 mg | ORAL_TABLET | Freq: Once | ORAL | Status: DC | PRN

## 2019-12-13 MED ORDER — BUPIVACAINE LIPOSOME 1.3 % IJ SUSP: 20.0000 mL | Freq: Once | INTRAMUSCULAR | Status: DC

## 2019-12-13 MED ORDER — METOCLOPRAMIDE HCL 5 MG PO TABS: 5.0000 mg | ORAL_TABLET | Freq: Three times a day (TID) | ORAL | Status: DC | PRN

## 2019-12-13 MED ORDER — PHENYLEPHRINE HCL-NACL 10-0.9 MG/250ML-% IV SOLN
INTRAVENOUS | Status: DC | PRN
  Administered 2019-12-13: 20 ug/min via INTRAVENOUS

## 2019-12-13 MED ORDER — PHENOL 1.4 % MT LIQD: 1.0000 | OROMUCOSAL | Status: DC | PRN

## 2019-12-13 MED ORDER — ONDANSETRON HCL 4 MG PO TABS: 4.0000 mg | ORAL_TABLET | Freq: Three times a day (TID) | ORAL | 0 refills | Status: DC | PRN

## 2019-12-13 MED ORDER — MEPERIDINE HCL 25 MG/ML IJ SOLN: 6.2500 mg | INTRAMUSCULAR | Status: DC | PRN

## 2019-12-13 MED ORDER — OXYCODONE HCL 5 MG PO TABS
5.0000 mg | ORAL_TABLET | ORAL | Status: DC | PRN
  Administered 2019-12-13 – 2019-12-14 (×2): 10 mg via ORAL
  Filled 2019-12-13: qty 1
  Filled 2019-12-13 (×2): qty 2

## 2019-12-13 MED ORDER — BUPIVACAINE HCL (PF) 0.25 % IJ SOLN
INTRAMUSCULAR | Status: AC
  Filled 2019-12-13: qty 30

## 2019-12-13 MED ORDER — OXYCODONE HCL 5 MG/5ML PO SOLN: 5.0000 mg | Freq: Once | ORAL | Status: DC | PRN

## 2019-12-13 MED ORDER — LACTATED RINGERS IV SOLN: INTRAVENOUS | Status: DC | PRN

## 2019-12-13 MED ORDER — FENTANYL CITRATE (PF) 100 MCG/2ML IJ SOLN: 25.0000 ug | INTRAMUSCULAR | Status: DC | PRN

## 2019-12-13 MED ORDER — BUPIVACAINE-EPINEPHRINE 0.25% -1:200000 IJ SOLN
INTRAMUSCULAR | Status: DC | PRN
  Administered 2019-12-13: 20 mL

## 2019-12-13 MED ORDER — ONDANSETRON HCL 4 MG/2ML IJ SOLN: 4.0000 mg | Freq: Four times a day (QID) | INTRAMUSCULAR | Status: DC | PRN

## 2019-12-13 MED ORDER — EPHEDRINE SULFATE 50 MG/ML IJ SOLN
INTRAMUSCULAR | Status: DC | PRN
  Administered 2019-12-13 (×2): 5 mg via INTRAVENOUS
  Administered 2019-12-13: 10 mg via INTRAVENOUS
  Administered 2019-12-13: 5 mg via INTRAVENOUS

## 2019-12-13 MED ORDER — ACETAMINOPHEN 325 MG PO TABS: 325.0000 mg | ORAL_TABLET | ORAL | Status: DC | PRN

## 2019-12-13 MED ORDER — ONDANSETRON HCL 4 MG/2ML IJ SOLN
INTRAMUSCULAR | Status: DC | PRN
  Administered 2019-12-13: 4 mg via INTRAVENOUS

## 2019-12-13 MED ORDER — LIDOCAINE HCL (CARDIAC) PF 100 MG/5ML IV SOSY
PREFILLED_SYRINGE | INTRAVENOUS | Status: DC | PRN
  Administered 2019-12-13: 40 mg via INTRAVENOUS

## 2019-12-13 MED ORDER — PROPOFOL 10 MG/ML IV BOLUS
INTRAVENOUS | Status: AC
  Filled 2019-12-13: qty 20

## 2019-12-13 MED ORDER — MENTHOL 3 MG MT LOZG: 1.0000 | LOZENGE | OROMUCOSAL | Status: DC | PRN

## 2019-12-13 MED ORDER — CHLORHEXIDINE GLUCONATE 0.12 % MT SOLN
OROMUCOSAL | Status: AC
  Administered 2019-12-13: 15 mL via OROMUCOSAL
  Filled 2019-12-13: qty 15

## 2019-12-13 MED ORDER — FENTANYL CITRATE (PF) 100 MCG/2ML IJ SOLN
INTRAMUSCULAR | Status: AC
  Administered 2019-12-13: 50 ug via INTRAVENOUS
  Filled 2019-12-13: qty 2

## 2019-12-13 MED ORDER — CARVEDILOL 12.5 MG PO TABS
12.5000 mg | ORAL_TABLET | Freq: Two times a day (BID) | ORAL | Status: DC
  Administered 2019-12-13 – 2019-12-16 (×7): 12.5 mg via ORAL
  Filled 2019-12-13 (×7): qty 1

## 2019-12-13 MED ORDER — HYDROMORPHONE HCL 1 MG/ML IJ SOLN
0.5000 mg | INTRAMUSCULAR | Status: DC | PRN
  Administered 2019-12-13: 1 mg via INTRAVENOUS
  Filled 2019-12-13: qty 1

## 2019-12-13 MED ORDER — METHOCARBAMOL 500 MG PO TABS: 500.0000 mg | ORAL_TABLET | Freq: Four times a day (QID) | ORAL | Status: DC | PRN

## 2019-12-13 MED ORDER — SODIUM CHLORIDE 0.9 % IR SOLN
Status: DC | PRN
  Administered 2019-12-13: 3000 mL

## 2019-12-13 MED ORDER — ACETAMINOPHEN 160 MG/5ML PO SOLN: 325.0000 mg | ORAL | Status: DC | PRN

## 2019-12-13 MED ORDER — ACETAMINOPHEN 10 MG/ML IV SOLN: 1000.0000 mg | Freq: Once | INTRAVENOUS | Status: DC | PRN

## 2019-12-13 MED ORDER — LACTATED RINGERS IV SOLN: INTRAVENOUS | Status: DC

## 2019-12-13 MED ORDER — BUSPIRONE HCL 5 MG PO TABS
15.0000 mg | ORAL_TABLET | Freq: Two times a day (BID) | ORAL | Status: DC
  Administered 2019-12-13 – 2019-12-16 (×7): 15 mg via ORAL
  Filled 2019-12-13 (×7): qty 1

## 2019-12-13 MED ORDER — IRRISEPT - 450ML BOTTLE WITH 0.05% CHG IN STERILE WATER, USP 99.95% OPTIME
TOPICAL | Status: DC | PRN
  Administered 2019-12-13: 450 mL via TOPICAL

## 2019-12-13 MED ORDER — TRANEXAMIC ACID-NACL 1000-0.7 MG/100ML-% IV SOLN
INTRAVENOUS | Status: AC
  Filled 2019-12-13: qty 100

## 2019-12-13 MED ORDER — SACCHAROMYCES BOULARDII 250 MG PO CAPS
250.0000 mg | ORAL_CAPSULE | Freq: Two times a day (BID) | ORAL | Status: DC
  Administered 2019-12-13 – 2019-12-16 (×7): 250 mg via ORAL
  Filled 2019-12-13 (×7): qty 1

## 2019-12-13 MED ORDER — PHENYLEPHRINE HCL (PRESSORS) 10 MG/ML IV SOLN
INTRAVENOUS | Status: DC | PRN
  Administered 2019-12-13: 100 ug via INTRAVENOUS
  Administered 2019-12-13: 40 ug via INTRAVENOUS

## 2019-12-13 MED ORDER — OXYCODONE HCL 5 MG PO TABS
10.0000 mg | ORAL_TABLET | ORAL | Status: DC | PRN
  Administered 2019-12-15: 10 mg via ORAL
  Filled 2019-12-13: qty 2

## 2019-12-13 MED ORDER — DIPHENHYDRAMINE HCL 12.5 MG/5ML PO ELIX: 25.0000 mg | ORAL_SOLUTION | ORAL | Status: DC | PRN

## 2019-12-13 MED ORDER — ROPIVACAINE HCL 5 MG/ML IJ SOLN
INTRAMUSCULAR | Status: DC | PRN
  Administered 2019-12-13: 30 mL via PERINEURAL

## 2019-12-13 MED ORDER — CEFAZOLIN SODIUM-DEXTROSE 2-4 GM/100ML-% IV SOLN
2.0000 g | Freq: Four times a day (QID) | INTRAVENOUS | Status: AC
  Administered 2019-12-13 (×2): 2 g via INTRAVENOUS
  Filled 2019-12-13 (×2): qty 100

## 2019-12-13 MED ORDER — VANCOMYCIN HCL 1000 MG IV SOLR
INTRAVENOUS | Status: DC | PRN
  Administered 2019-12-13: 1000 mg

## 2019-12-13 MED ORDER — ONDANSETRON HCL 4 MG PO TABS: 4.0000 mg | ORAL_TABLET | Freq: Four times a day (QID) | ORAL | Status: DC | PRN

## 2019-12-13 MED ORDER — CEFAZOLIN SODIUM-DEXTROSE 2-4 GM/100ML-% IV SOLN
2.0000 g | INTRAVENOUS | Status: AC
  Administered 2019-12-13: 2 g via INTRAVENOUS

## 2019-12-13 MED ORDER — CHLORHEXIDINE GLUCONATE 0.12 % MT SOLN: 15.0000 mL | Freq: Once | OROMUCOSAL | Status: AC

## 2019-12-13 MED ORDER — DEXMEDETOMIDINE (PRECEDEX) IN NS 20 MCG/5ML (4 MCG/ML) IV SYRINGE
PREFILLED_SYRINGE | INTRAVENOUS | Status: DC | PRN
  Administered 2019-12-13 (×2): 4 ug via INTRAVENOUS

## 2019-12-13 MED ORDER — VANCOMYCIN HCL 1000 MG IV SOLR
INTRAVENOUS | Status: AC
  Filled 2019-12-13: qty 1000

## 2019-12-13 MED ORDER — PROPOFOL 10 MG/ML IV BOLUS
INTRAVENOUS | Status: DC | PRN
  Administered 2019-12-13 (×3): 10 mg via INTRAVENOUS

## 2019-12-13 MED ORDER — OXYCODONE HCL ER 10 MG PO T12A
10.0000 mg | EXTENDED_RELEASE_TABLET | Freq: Two times a day (BID) | ORAL | Status: DC
  Administered 2019-12-13 – 2019-12-16 (×7): 10 mg via ORAL
  Filled 2019-12-13 (×7): qty 1

## 2019-12-13 MED ORDER — OXYCODONE-ACETAMINOPHEN 5-325 MG PO TABS
1.0000 | ORAL_TABLET | Freq: Four times a day (QID) | ORAL | 0 refills | Status: DC | PRN
Start: 2019-12-13 — End: 2019-12-14

## 2019-12-13 MED ORDER — 0.9 % SODIUM CHLORIDE (POUR BTL) OPTIME
TOPICAL | Status: DC | PRN
  Administered 2019-12-13: 1000 mL

## 2019-12-13 MED ORDER — FENTANYL CITRATE (PF) 100 MCG/2ML IJ SOLN
INTRAMUSCULAR | Status: DC | PRN
  Administered 2019-12-13 (×2): 25 ug via INTRAVENOUS

## 2019-12-13 MED ORDER — LOSARTAN POTASSIUM 50 MG PO TABS
50.0000 mg | ORAL_TABLET | Freq: Every day | ORAL | Status: DC
  Administered 2019-12-13 – 2019-12-16 (×4): 50 mg via ORAL
  Filled 2019-12-13 (×4): qty 1

## 2019-12-13 MED ORDER — TRANEXAMIC ACID-NACL 1000-0.7 MG/100ML-% IV SOLN
1000.0000 mg | INTRAVENOUS | Status: AC
  Administered 2019-12-13: 1000 mg via INTRAVENOUS

## 2019-12-13 MED ORDER — DOCUSATE SODIUM 100 MG PO CAPS: 100.0000 mg | ORAL_CAPSULE | Freq: Every day | ORAL | 2 refills | Status: DC | PRN

## 2019-12-13 MED ORDER — ACETAMINOPHEN 325 MG PO TABS: 325.0000 mg | ORAL_TABLET | Freq: Four times a day (QID) | ORAL | Status: DC | PRN

## 2019-12-13 MED ORDER — SERTRALINE HCL 100 MG PO TABS
100.0000 mg | ORAL_TABLET | Freq: Every day | ORAL | Status: DC
  Administered 2019-12-13 – 2019-12-16 (×4): 100 mg via ORAL
  Filled 2019-12-13 (×4): qty 1

## 2019-12-13 MED ORDER — SODIUM CHLORIDE 0.9 % IV SOLN: INTRAVENOUS | Status: DC

## 2019-12-13 MED ORDER — APIXABAN 5 MG PO TABS
5.0000 mg | ORAL_TABLET | Freq: Two times a day (BID) | ORAL | Status: DC
  Administered 2019-12-14 – 2019-12-16 (×6): 5 mg via ORAL
  Filled 2019-12-13 (×6): qty 1

## 2019-12-13 MED ORDER — ALUM & MAG HYDROXIDE-SIMETH 200-200-20 MG/5ML PO SUSP: 30.0000 mL | ORAL | Status: DC | PRN

## 2019-12-13 MED ORDER — FENTANYL CITRATE (PF) 100 MCG/2ML IJ SOLN
50.0000 ug | Freq: Once | INTRAMUSCULAR | Status: AC
  Administered 2019-12-13: 50 ug via INTRAVENOUS

## 2019-12-13 MED ORDER — FENTANYL CITRATE (PF) 250 MCG/5ML IJ SOLN
INTRAMUSCULAR | Status: AC
  Filled 2019-12-13: qty 5

## 2019-12-13 MED ORDER — METHOCARBAMOL 1000 MG/10ML IJ SOLN
500.0000 mg | Freq: Four times a day (QID) | INTRAVENOUS | Status: DC | PRN
  Filled 2019-12-13: qty 5

## 2019-12-13 MED ORDER — FENTANYL CITRATE (PF) 100 MCG/2ML IJ SOLN: 50.0000 ug | Freq: Once | INTRAMUSCULAR | Status: AC

## 2019-12-13 MED ORDER — PROPOFOL 500 MG/50ML IV EMUL
INTRAVENOUS | Status: DC | PRN
  Administered 2019-12-13: 35 ug/kg/min via INTRAVENOUS

## 2019-12-13 MED ORDER — KETOROLAC TROMETHAMINE 15 MG/ML IJ SOLN
15.0000 mg | Freq: Four times a day (QID) | INTRAMUSCULAR | Status: AC
  Administered 2019-12-13 – 2019-12-14 (×2): 15 mg via INTRAVENOUS
  Filled 2019-12-13 (×3): qty 1

## 2019-12-13 MED ORDER — HYDRALAZINE HCL 25 MG PO TABS
25.0000 mg | ORAL_TABLET | Freq: Three times a day (TID) | ORAL | Status: DC
  Administered 2019-12-13 – 2019-12-16 (×11): 25 mg via ORAL
  Filled 2019-12-13 (×11): qty 1

## 2019-12-13 MED ORDER — METHOCARBAMOL 500 MG PO TABS: 500.0000 mg | ORAL_TABLET | Freq: Two times a day (BID) | ORAL | 0 refills | Status: DC | PRN

## 2019-12-13 MED ORDER — SODIUM CHLORIDE 0.9% FLUSH
INTRAVENOUS | Status: DC | PRN
  Administered 2019-12-13: 20 mL via INTRAVENOUS

## 2019-12-13 MED ORDER — CEFAZOLIN SODIUM-DEXTROSE 2-4 GM/100ML-% IV SOLN
INTRAVENOUS | Status: AC
  Filled 2019-12-13: qty 100

## 2019-12-13 MED ORDER — DOCUSATE SODIUM 100 MG PO CAPS
100.0000 mg | ORAL_CAPSULE | Freq: Two times a day (BID) | ORAL | Status: DC
  Administered 2019-12-13 – 2019-12-16 (×5): 100 mg via ORAL
  Filled 2019-12-13 (×7): qty 1

## 2019-12-13 MED ORDER — MAGNESIUM CITRATE PO SOLN: 1.0000 | Freq: Once | ORAL | Status: DC | PRN

## 2019-12-13 SURGICAL SUPPLY — 81 items
ALCOHOL 70% 16 OZ (MISCELLANEOUS) ×3 IMPLANT
BAG DECANTER FOR FLEXI CONT (MISCELLANEOUS) ×3 IMPLANT
BANDAGE ESMARK 6X9 LF (GAUZE/BANDAGES/DRESSINGS) ×1 IMPLANT
BLADE SAG 18X100X1.27 (BLADE) ×3 IMPLANT
BNDG ESMARK 6X9 LF (GAUZE/BANDAGES/DRESSINGS) ×3
BOWL SMART MIX CTS (DISPOSABLE) ×3 IMPLANT
CATH FOLEY LATEX FREE 16FR (CATHETERS) ×3
CATH FOLEY LF 16FR (CATHETERS) ×1 IMPLANT
CEMENT BONE REFOBACIN R1X40 US (Cement) ×6 IMPLANT
CLOSURE STERI-STRIP 1/2X4 (GAUZE/BANDAGES/DRESSINGS) ×1
CLSR STERI-STRIP ANTIMIC 1/2X4 (GAUZE/BANDAGES/DRESSINGS) ×2 IMPLANT
COOLER ICEMAN CLASSIC (MISCELLANEOUS) ×3 IMPLANT
COVER SURGICAL LIGHT HANDLE (MISCELLANEOUS) ×3 IMPLANT
COVER WAND RF STERILE (DRAPES) IMPLANT
CUFF TOURN SGL QUICK 34 (TOURNIQUET CUFF) ×3
CUFF TOURN SGL QUICK 42 (TOURNIQUET CUFF) IMPLANT
CUFF TRNQT CYL 34X4.125X (TOURNIQUET CUFF) ×1 IMPLANT
DERMABOND ADHESIVE PROPEN (GAUZE/BANDAGES/DRESSINGS) ×2
DERMABOND ADVANCED (GAUZE/BANDAGES/DRESSINGS) ×2
DERMABOND ADVANCED .7 DNX12 (GAUZE/BANDAGES/DRESSINGS) ×1 IMPLANT
DERMABOND ADVANCED .7 DNX6 (GAUZE/BANDAGES/DRESSINGS) ×1 IMPLANT
DRAPE EXTREMITY T 121X128X90 (DISPOSABLE) ×3 IMPLANT
DRAPE HALF SHEET 40X57 (DRAPES) ×3 IMPLANT
DRAPE INCISE IOBAN 66X45 STRL (DRAPES) IMPLANT
DRAPE ORTHO SPLIT 77X108 STRL (DRAPES) ×6
DRAPE POUCH INSTRU U-SHP 10X18 (DRAPES) ×3 IMPLANT
DRAPE SURG ORHT 6 SPLT 77X108 (DRAPES) ×2 IMPLANT
DRAPE U-SHAPE 47X51 STRL (DRAPES) ×6 IMPLANT
DRSG AQUACEL AG ADV 3.5X10 (GAUZE/BANDAGES/DRESSINGS) ×3 IMPLANT
DURAPREP 26ML APPLICATOR (WOUND CARE) ×9 IMPLANT
ELECT CAUTERY BLADE 6.4 (BLADE) ×3 IMPLANT
ELECT REM PT RETURN 9FT ADLT (ELECTROSURGICAL) ×3
ELECTRODE REM PT RTRN 9FT ADLT (ELECTROSURGICAL) ×1 IMPLANT
FEMORAL KNEE COMP SZ 8 STND LT (Knees) ×3 IMPLANT
FEMORAL KNEE COMP SZ 8STD LT (Knees) ×1 IMPLANT
GLOVE BIOGEL PI IND STRL 7.0 (GLOVE) ×1 IMPLANT
GLOVE BIOGEL PI INDICATOR 7.0 (GLOVE) ×2
GLOVE ECLIPSE 7.0 STRL STRAW (GLOVE) ×9 IMPLANT
GLOVE SKINSENSE NS SZ7.5 (GLOVE) ×6
GLOVE SKINSENSE STRL SZ7.5 (GLOVE) ×3 IMPLANT
GLOVE SURG SS PI 7.5 STRL IVOR (GLOVE) ×6 IMPLANT
GLOVE SURG SYN 7.5  E (GLOVE) ×12
GLOVE SURG SYN 7.5 E (GLOVE) ×4 IMPLANT
GOWN STRL REIN XL XLG (GOWN DISPOSABLE) ×3 IMPLANT
GOWN STRL REUS W/ TWL LRG LVL3 (GOWN DISPOSABLE) ×1 IMPLANT
GOWN STRL REUS W/TWL LRG LVL3 (GOWN DISPOSABLE) ×3
HANDPIECE INTERPULSE COAX TIP (DISPOSABLE) ×3
HOOD PEEL AWAY FLYTE STAYCOOL (MISCELLANEOUS) ×6 IMPLANT
JET LAVAGE IRRISEPT WOUND (IRRIGATION / IRRIGATOR) ×3
KIT BASIN OR (CUSTOM PROCEDURE TRAY) ×3 IMPLANT
KIT TURNOVER KIT B (KITS) ×3 IMPLANT
LAVAGE JET IRRISEPT WOUND (IRRIGATION / IRRIGATOR) ×1 IMPLANT
MANIFOLD NEPTUNE II (INSTRUMENTS) ×3 IMPLANT
MARKER SKIN DUAL TIP RULER LAB (MISCELLANEOUS) ×3 IMPLANT
NEEDLE SPNL 18GX3.5 QUINCKE PK (NEEDLE) ×6 IMPLANT
NS IRRIG 1000ML POUR BTL (IV SOLUTION) ×3 IMPLANT
PACK TOTAL JOINT (CUSTOM PROCEDURE TRAY) ×3 IMPLANT
PAD ARMBOARD 7.5X6 YLW CONV (MISCELLANEOUS) ×6 IMPLANT
PAD COLD SHLDR WRAP-ON (PAD) ×3 IMPLANT
SAW OSC TIP CART 19.5X105X1.3 (SAW) ×3 IMPLANT
SET HNDPC FAN SPRY TIP SCT (DISPOSABLE) ×1 IMPLANT
STAPLER VISISTAT 35W (STAPLE) IMPLANT
STEM POLY PAT PLY 32M KNEE (Knees) ×3 IMPLANT
STEM TIBIA 5 DEG SZ E L KNEE (Knees) ×1 IMPLANT
STEM TIBIAL 10 8-11 EF POLY LT (Joint) ×3 IMPLANT
SUCTION FRAZIER HANDLE 10FR (MISCELLANEOUS) ×3
SUCTION TUBE FRAZIER 10FR DISP (MISCELLANEOUS) ×1 IMPLANT
SUT ETHILON 2 0 FS 18 (SUTURE) IMPLANT
SUT MNCRL AB 4-0 PS2 18 (SUTURE) IMPLANT
SUT VIC AB 0 CT1 27 (SUTURE) ×6
SUT VIC AB 0 CT1 27XBRD ANBCTR (SUTURE) ×2 IMPLANT
SUT VIC AB 1 CTX 27 (SUTURE) ×9 IMPLANT
SUT VIC AB 2-0 CT1 27 (SUTURE) ×12
SUT VIC AB 2-0 CT1 TAPERPNT 27 (SUTURE) ×4 IMPLANT
SYR 50ML LL SCALE MARK (SYRINGE) ×6 IMPLANT
TIBIA STEM 5 DEG SZ E L KNEE (Knees) ×3 IMPLANT
TOWEL GREEN STERILE (TOWEL DISPOSABLE) ×3 IMPLANT
TOWEL GREEN STERILE FF (TOWEL DISPOSABLE) ×3 IMPLANT
TRAY CATH 16FR W/PLASTIC CATH (SET/KITS/TRAYS/PACK) IMPLANT
UNDERPAD 30X36 HEAVY ABSORB (UNDERPADS AND DIAPERS) ×3 IMPLANT
WRAP KNEE MAXI GEL POST OP (GAUZE/BANDAGES/DRESSINGS) ×3 IMPLANT

## 2019-12-13 NOTE — Anesthesia Procedure Notes (Signed)
Spinal  Patient location during procedure: OR Start time: 12/13/2019 11:15 AM End time: 12/13/2019 11:25 AM Staffing Performed: anesthesiologist  Anesthesiologist: Murvin Natal, MD Preanesthetic Checklist Completed: patient identified, IV checked, risks and benefits discussed, surgical consent, monitors and equipment checked, pre-op evaluation and timeout performed Spinal Block Patient position: sitting Prep: DuraPrep Patient monitoring: cardiac monitor, continuous pulse ox and blood pressure Approach: midline Location: L4-5 Injection technique: single-shot Needle Needle type: Pencan  Needle gauge: 24 G Needle length: 9 cm Assessment Sensory level: T10 Additional Notes Functioning IV was confirmed and monitors were applied. Sterile prep and drape, including hand hygiene and sterile gloves were used. The patient was positioned and the spine was prepped. The skin was anesthetized with lidocaine.  Free flow of clear CSF was obtained on the fourth attempt prior to injecting local anesthetic into the CSF.  The spinal needle aspirated freely following injection.  The needle was carefully withdrawn.  The patient tolerated the procedure well.

## 2019-12-13 NOTE — Transfer of Care (Signed)
Immediate Anesthesia Transfer of Care Note  Patient: Susan [name]  Procedure(s) Performed: LEFT TOTAL KNEE ARTHROPLASTY (Left Knee)  Patient Location: PACU  Anesthesia Type:MAC, Spinal and MAC combined with regional for post-op pain  Level of Consciousness: awake, alert , oriented and patient cooperative  Airway & Oxygen Therapy: Patient Spontanous Breathing and Patient connected to nasal cannula oxygen  Post-op Assessment: Report given to RN and Post -op Vital signs reviewed and stable  Post vital signs: Reviewed and stable  Last Vitals:  Vitals Value Taken Time  BP    Temp    Pulse    Resp    SpO2      Last Pain:  Vitals:   12/13/19 1017  TempSrc:   PainSc: 0-No pain         Complications: No complications documented.

## 2019-12-13 NOTE — Anesthesia Postprocedure Evaluation (Signed)
Anesthesia Post Note  Patient: Susan [name]  Procedure(s) Performed: LEFT TOTAL KNEE ARTHROPLASTY (Left Knee)     Patient location during evaluation: PACU Anesthesia Type: Regional and Spinal Level of consciousness: oriented and awake Pain management: pain level controlled Vital Signs Assessment: post-procedure vital signs reviewed and stable Respiratory status: spontaneous breathing, respiratory function stable and patient connected to nasal cannula oxygen Cardiovascular status: blood pressure returned to baseline and stable Postop Assessment: no headache, no backache and no apparent nausea or vomiting Anesthetic complications: no   No complications documented.  Last Vitals:  Vitals:   12/13/19 1504 12/13/19 1917  BP: (!) 161/74 (!) 143/64  Pulse: (!) 48 (!) 55  Resp: 18 16  Temp: 36.6 C 36.7 C  SpO2: 98% 95%    Last Pain:  Vitals:   12/13/19 1748  TempSrc:   PainSc: Asleep                 Demarian Epps P Makalynn Berwanger

## 2019-12-13 NOTE — H&P (Signed)
PREOPERATIVE H&P  Chief Complaint: left knee degenerative joint disease  HPI: Susan [name] is a 79 y.o. female who presents for surgical treatment of left knee degenerative joint disease.  She denies any changes in medical history.  Past Medical History:  Diagnosis Date  . Anxiety   . Arthritis    "legs, back" (07/29/2012)  . Cellulitis and abscess of leg 12/29/2017  . Chronic diastolic (congestive) heart failure ([place])   . Chronic lower back pain   . Complication of anesthesia    "I have apnea" (07/29/2012)  . Dementia ([place]) 03/12/2018  . 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 Family history of anesthesia complication    "some wake up during OR; some are hard to wake up; some both" (07/29/2012)  . GERD (gastroesophageal reflux disease)   . 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 ([place])   . History of CVA (cerebrovascular accident) 02/01/2015  . History of stomach ulcers 1980's  . Hyperlipidemia   . Hyperparathyroidism, primary ([place]) 04/22/2012  . Hypertensive heart disease   . Incontinent of urine    wears pads  . Major depressive disorder, recurrent episode, severe ([place]) 11/21/2015  . OSA on CPAP   . Osteoarthritis of right knee   . Pedal edema   . Persistent atrial fibrillation ([facility])    CHADS2VASC score is 7 - on chronic anticoagulation with Apixaban  . Spinal stenosis   . Umbilical hernia    "unrepaired" (07/29/2012)  . Varicose veins    "BLE" (07/29/2012)  . Vascular dementia ([place])   . Venous stasis dermatitis of left lower extremity 04/29/2018   Past Surgical History:  Procedure Laterality Date  . CATARACT EXTRACTION    . COLONOSCOPY    . ENDOVENOUS ABLATION SAPHENOUS VEIN W/ LASER Left 06/24/2019   endovenous laser ablation left greater saphenous vein by Gae Gallop MD   . IUD REMOVAL  [phone]  . PARATHYROIDECTOMY N/A 06/16/2012   Procedure: NECK EXPLORATION AND LEFT SUPERIOR PARATHYROIDECTOMY;  Surgeon: Earnstine Regal, MD;   Location: [facility];  Service: General;  Laterality: N/A;  . 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 TOTAL KNEE ARTHROPLASTY Right 07/29/2012   Procedure: TOTAL KNEE ARTHROPLASTY;  Surgeon: Ninetta Lights, MD;  Location: [facility];  Service: Orthopedics;  Laterality: Right;   Social History   Socioeconomic History  . Marital status: Widowed    Spouse name: Not on file  . Number of children: Not on file  . Years of education: Not on file  . Highest education level: Not on file  Occupational History  . Occupation: retired    Fish farm manager: RETIRED  Tobacco Use  . Smoking status: Former Smoker    Packs/day: 1.00    Years: 30.00    Pack years: 30.00    Types: Cigarettes    Quit date: 01/07/1978    Years since quitting: 41.9  . Smokeless tobacco: Never Used  Vaping Use  . Vaping Use: Never used  Substance and Sexual Activity  . Alcohol use: No  . Drug use: No  . Sexual activity: Not Currently    Comment: intercourse age 7,less than 5 secxual partners,  Other Topics Concern  . Not on file  Social History Narrative   Lives at home alone   Right-handed   Caffeine: 1 cup of coffee per day         Diet:      Caffeine:      Married, if yes  what year:       Do you live in a house, apartment, assisted living, condo, trailer, ect:      Pets:      Current/Past profession:      Exercise:         Living Will: Yes   DNR: No   POA/HPOA: Yes      Functional Status:   Do you have difficulty bathing or dressing yourself? Yes   Do you have difficulty preparing food or eating? No   Do you have difficulty managing your medications? Yes   Do you have difficulty managing your finances? Yes   Do you have difficulty affording your medications? Yes   Social Determinants of Health   Financial Resource Strain:   . Difficulty of Paying Living Expenses: Not on file  Food Insecurity:   . Worried About Charity fundraiser in the Last Year: Not on file  . Ran Out of Food in the Last Year:  Not on file  Transportation Needs:   . Lack of Transportation (Medical): Not on file  . Lack of Transportation (Non-Medical): Not on file  Physical Activity:   . Days of Exercise per Week: Not on file  . Minutes of Exercise per Session: Not on file  Stress:   . Feeling of Stress : Not on file  Social Connections:   . Frequency of Communication with Friends and Family: Not on file  . Frequency of Social Gatherings with Friends and Family: Not on file  . Attends Religious Services: Not on file  . Active Member of Clubs or Organizations: Not on file  . Attends Archivist Meetings: Not on file  . Marital Status: Not on file   Family History  Problem Relation Age of Onset  . Hypertension Mother        deceased  . Stroke Mother   . Breast cancer Mother   . Lung cancer Father        deceased  . Diabetes Daughter   . Hypertension Daughter    Allergies  Allergen Reactions  . Ace Inhibitors Cough  . Lipitor [Atorvastatin] Swelling  . Simvastatin Other (See Comments)    Myalgias     . Latex Itching   Prior to Admission medications   Medication Sig Start Date End Date Taking? Authorizing Provider  acetaminophen (TYLENOL) 500 MG tablet Take 2 tablets (1,000 mg total) by mouth as needed. Patient taking differently: Take 1,000 mg by mouth every 6 (six) hours as needed for moderate pain or headache.  05/22/18  Yes Lauree Chandler, NP  apixaban (ELIQUIS) 5 MG TABS tablet Take 1 tablet (5 mg total) by mouth every 12 (twelve) hours. 05/22/18  Yes Lauree Chandler, NP  azelastine (ASTELIN) 0.1 % nasal spray Place 1 spray into both nostrils 2 (two) times daily as needed for rhinitis. Use in each nostril as directed   Yes [provider]  buPROPion (WELLBUTRIN XL) 150 MG 24 hr tablet Take 150 mg by mouth daily.   Yes [provider]  busPIRone (BUSPAR) 15 MG tablet Take 15 mg by mouth 2 (two) times daily.   Yes [provider]  carvedilol (COREG) 12.5  MG tablet TAKE 1 TABLET BY MOUTH 2 TIMES DAILY WITH MEALS Patient taking differently: Take 12.5 mg by mouth 2 (two) times daily with a meal.  06/17/18  Yes Lauree Chandler, NP  diclofenac sodium (VOLTAREN) 1 % GEL Apply 2 g topically 4 (four) times daily.  Patient taking differently: Apply 2 g topically 4 (four) times daily as needed (pain).  05/25/18  Yes Eubanks, Carlos American, NP  DOK 100 MG capsule TAKE 1 CAPSULE BY MOUTH 2 TIMES DAILY Patient taking differently: Take 100 mg by mouth 2 (two) times daily.  06/17/18  Yes Lauree Chandler, NP  donepezil (ARICEPT) 10 MG tablet Take 1 tablet (10 mg total) by mouth at bedtime. For memory loss 05/25/18  Yes Eubanks, Carlos American, NP  FLORASTOR 250 MG capsule TAKE 1 CAPSULE BY MOUTH 2 TIMES DAILY Patient taking differently: Take 250 mg by mouth 2 (two) times daily.  06/17/18  Yes Lauree Chandler, NP  furosemide (LASIX) 40 MG tablet Take 40 mg by mouth daily.   Yes [provider]  hydrALAZINE (APRESOLINE) 25 MG tablet TAKE 1 TABLET BY MOUTH 3 TIMES DAILY Patient taking differently: Take 25 mg by mouth 3 (three) times daily.  06/17/18  Yes Lauree Chandler, NP  losartan (COZAAR) 50 MG tablet TAKE 1 TABLET BY MOUTH EVERY DAY Patient taking differently: Take 50 mg by mouth daily.  08/07/18  Yes Lauree Chandler, NP  Menthol, Topical Analgesic, (BIOFREEZE) 4 % GEL Apply 3 oz topically 3 (three) times daily. For knee pain Patient taking differently: Apply 1 application topically 3 (three) times daily as needed (knee pain).  09/25/17  Yes Ngetich, Dinah C, NP  Multiple Vitamin (GNP ESSENTIAL ONE DAILY) TABS TAKE 1 TABLET BY MOUTH EVERY DAY Patient taking differently: Take 1 tablet by mouth daily.  06/17/18  Yes Lauree Chandler, NP  pantoprazole (PROTONIX) 40 MG tablet TAKE 1 TABLET BY MOUTH EVERY DAY Patient taking differently: Take 40 mg by mouth daily.  08/07/18  Yes Lauree Chandler, NP  pravastatin (PRAVACHOL) 40 MG tablet TAKE 1 TABLET BY  MOUTH EVERY DAY Patient taking differently: Take 40 mg by mouth daily.  06/17/18  Yes Lauree Chandler, NP  sertraline (ZOLOFT) 100 MG tablet Take 100 mg by mouth daily.   Yes [provider]  traMADol (ULTRAM) 50 MG tablet Take 1-2 tablets (50-100 mg total) by mouth daily as needed. Patient taking differently: Take 50-100 mg by mouth daily as needed for moderate pain.  09/10/19  Yes Leandrew Koyanagi, MD  buPROPion (WELLBUTRIN SR) 200 MG 12 hr tablet TAKE 1 TABLET BY MOUTH EVERY DAY Patient not taking: Reported on 12/06/2019 06/17/18   Lauree Chandler, NP  Calcium Carbonate-Vitamin D 600-400 MG-UNIT tablet TAKE 1 TABLET BY MOUTH EVERY DAY Patient not taking: Reported on 12/06/2019 07/09/18   Lauree Chandler, NP  cholecalciferol (VITAMIN D) 25 MCG (1000 UT) tablet TAKE 1 TABLET BY MOUTH EVERY DAY Patient not taking: Reported on 12/06/2019 07/09/18   Lauree Chandler, NP  enoxaparin (LOVENOX) 80 MG/0.8ML injection Inject 0.8 mLs (80 mg total) into the skin daily for 3 days. 08/18/19 12/06/19  Imogene Burn, PA-C  furosemide (LASIX) 20 MG tablet TAKE 1 TABLET BY MOUTH EVERY DAY Patient not taking: Reported on 12/06/2019 08/07/18   Lauree Chandler, NP  LORazepam (ATIVAN) 1 MG tablet Take 1 tablet 30 minutes prior to leaving house on day of procedure.  Bring second tablet to office on day of procedure. 06/22/19   Angelia Mould, MD  POLY-IRON 150 150 MG capsule TAKE 1 CAPSULE BY MOUTH EVERY DAY Patient not taking: Reported on 12/06/2019 06/17/18   Lauree Chandler, NP  sertraline (ZOLOFT) 50 MG tablet Take 1 tablet (50 mg total) by  mouth daily. Patient not taking: Reported on 12/06/2019 06/17/18   Lauree Chandler, NP  vitamin B-12 (CYANOCOBALAMIN) 1000 MCG tablet TAKE 2 TABLETS BY MOUTH EVERY DAY Patient not taking: Reported on 12/06/2019 06/17/18   Lauree Chandler, NP     Positive ROS: All other systems have been reviewed and were otherwise negative with the exception of  those mentioned in the HPI and as above.  Physical Exam: General: Alert, no acute distress Cardiovascular: No pedal edema Respiratory: No cyanosis, no use of accessory musculature GI: abdomen soft Skin: No lesions in the area of chief complaint Neurologic: Sensation intact distally Psychiatric: Patient is competent for consent with normal mood and affect Lymphatic: no lymphedema  MUSCULOSKELETAL: exam stable  Assessment: left knee degenerative joint disease  Plan: Plan for Procedure(s): LEFT TOTAL KNEE ARTHROPLASTY  The risks benefits and alternatives were discussed with the patient including but not limited to the risks of nonoperative treatment, versus surgical intervention including infection, bleeding, nerve injury,  blood clots, cardiopulmonary complications, morbidity, mortality, among others, and they were willing to proceed.   Preoperative templating of the joint replacement has been completed, documented, and submitted to the Operating Room personnel in order to optimize intra-operative equipment management.   Eduard Roux, MD 12/13/2019 9:50 AM

## 2019-12-13 NOTE — Progress Notes (Signed)
Orthopedic Tech Progress Note Patient Details:  Susan [name] July 31, 1940 [id number]  CPM Left Knee CPM Left Knee: On Left Knee Flexion (Degrees): 90 Left Knee Extension (Degrees): 0  Post Interventions Patient Tolerated: Well Instructions Provided: Care of device  Braulio Bosch 12/13/2019, 2:17 PM

## 2019-12-13 NOTE — Op Note (Signed)
Total Knee Arthroplasty Procedure Note  Preoperative diagnosis: Left knee osteoarthritis  Postoperative diagnosis:same  Operative procedure: Left total knee arthroplasty. CPT [phone]  Surgeon: N. Eduard Roux, MD  Assist: Madalyn Rob, PA-C; necessary for the timely completion of procedure and due to complexity of procedure.  Anesthesia: Spinal, regional, local  Tourniquet time: see anesthesia record  Implants used: Zimmer persona Femur: CR 8 Tibia: E Patella: 32 mm Polyethylene: 10 mm, MC  Indication: Susan [name] is a 79 y.o. year old female with a history of knee pain. Having failed conservative management, the patient elected to proceed with a total knee arthroplasty.  We have reviewed the risk and benefits of the surgery and they elected to proceed after voicing understanding.  Procedure:  After informed consent was obtained and understanding of the risk were voiced including but not limited to bleeding, infection, damage to surrounding structures including nerves and vessels, blood clots, leg length inequality and the failure to achieve desired results, the operative extremity was marked with verbal confirmation of the patient in the holding area.   The patient was then brought to the operating room and transported to the operating room table in the supine position.  A tourniquet was applied to the operative extremity around the upper thigh. The operative limb was then prepped and draped in the usual sterile fashion and preoperative antibiotics were administered.  A time out was performed prior to the start of surgery confirming the correct extremity, preoperative antibiotic administration, as well as team members, implants and instruments available for the case. Correct surgical site was also confirmed with preoperative radiographs. The limb was then elevated for exsanguination and the tourniquet was inflated. A midline incision was made and a standard medial parapatellar  approach was performed.  The infrapatellar fat pad was removed.  Suprapatellar synovium was removed to reveal the anterior distal femoral cortex.  A medial peel was performed to release the capsule of the medial tibial plateau.  The patella was then everted and was prepared and sized to a 32 mm.  A cover was placed on the patella for protection from retractors.  The knee was then brought into full flexion and we then turned our attention to the femur.  The cruciates were sacrificed.  Start site was drilled in the femur and the intramedullary distal femoral cutting guide was placed, set at 5 degrees valgus, taking 10 mm of distal resection. The distal cut was made. Osteophytes were then removed.  Next, the proximal tibial cutting guide was placed with appropriate slope, varus/valgus alignment and depth of resection. The proximal tibial cut was made. Gap blocks were then used to assess the extension gap and alignment, and appropriate soft tissue releases were performed. Attention was turned back to the femur, which was sized using the sizing guide to a size 8. Appropriate rotation of the femoral component was determined using epicondylar axis, Whiteside's line, and assessing the flexion gap under ligament tension. The appropriate size 4-in-1 cutting block was placed and checked with an angel wing and cuts were made. Posterior femoral osteophytes and uncapped bone were then removed with the curved osteotome.  Trial components were placed, and stability was checked in full extension, mid-flexion, and deep flexion. Proper tibial rotation was determined and marked.  The patella tracked well without a lateral release.  The femoral lugs were then drilled. Trial components were then removed and tibial preparation performed.  The tibia was sized for a size E component.  A posterior capsular  injection comprising of 20 cc of 1.3% exparel, 20 cc of 0.25% bupivicaine with epi and 20 cc of normal saline was performed for  postoperative pain control. The bony surfaces were irrigated with a pulse lavage and then dried. Bone cement was vacuum mixed on the back table, and the final components sized above were cemented into place.  Antibiotic irrigation was placed in the knee joint and soft tissues while the cement cured.  After cement had finished curing, excess cement was removed. The stability of the construct was re-evaluated throughout a range of motion and found to be acceptable. The trial liner was removed, the knee was copiously irrigated, and the knee was re-evaluated for any excess bone debris. The real polyethylene liner, 10 mm thick, was inserted and checked to ensure the locking mechanism had engaged appropriately. The tourniquet was deflated and hemostasis was achieved. The wound was irrigated with normal saline.  One gram of vancomycin powder was placed in the surgical bed.  Capsular closure was performed with a #1 vicryl, subcutaneous fat closed with a 0 vicryl suture, then subcutaneous tissue closed with interrupted 2.0 vicryl suture. The skin was then closed with a 2.0 nylon and dermabond. A sterile dressing was applied.  The patient was awakened in the operating room and taken to recovery in stable condition. All sponge, needle, and instrument counts were correct at the end of the case.  Position: supine  Complications: none.  Time Out: performed   Drains/Packing: none  Estimated blood loss: minimal  Returned to Recovery Room: in good condition.   Antibiotics: yes   Mechanical VTE (DVT) Prophylaxis: sequential compression devices, TED thigh-high  Chemical VTE (DVT) Prophylaxis: resume eliquis  Fluid Replacement  Crystalloid: see anesthesia record Blood: none  FFP: none   Specimens Removed: 1 to pathology   Sponge and Instrument Count Correct? yes   PACU: portable radiograph - knee AP and Lateral   Plan/RTC: Return in 2 weeks for wound check.   Weight Bearing/Load Lower Extremity: full    N. Eduard Roux, MD [facility] 12:37 PM

## 2019-12-13 NOTE — Anesthesia Procedure Notes (Signed)
Anesthesia Regional Block: Adductor canal block   Pre-Anesthetic Checklist: ,, timeout performed, Correct Patient, Correct Site, Correct Laterality, Correct Procedure,, site marked, risks and benefits discussed, Surgical consent,  Pre-op evaluation,  At surgeon's request and post-op pain management  Laterality: Left  Prep: chloraprep       Needles:  Injection technique: Single-shot  Needle Type: Echogenic Stimulator Needle     Needle Length: 9cm  Needle Gauge: 21     Additional Needles:   Procedures:,,,, ultrasound used (permanent image in chart),,,,  Narrative:  Start time: 12/13/2019 10:40 AM End time: 12/13/2019 10:50 AM Injection made incrementally with aspirations every 5 mL.  Performed by: Personally  Anesthesiologist: Murvin Natal, MD  Additional Notes: Functioning IV was confirmed and monitors were applied. A time-out was performed. Hand hygiene and sterile gloves were used. The thigh was placed in a frog-leg position and prepped in a sterile fashion. A 57mm 21ga Arrow echogenic stimulator needle was placed using ultrasound guidance. Negative aspiration and negative test dose prior to incremental administration of local anesthetic. The patient tolerated the procedure well.

## 2019-12-13 NOTE — Evaluation (Signed)
Physical Therapy Evaluation Patient Details Name: Susan [name] MRN: [id number] DOB: 23-Nov-1940 Today's Date: 12/13/2019   History of Present Illness  Pt is an 79 y/o female s/p L TKA. PMH includes dCHF, CVA, OSA on CPAP, MDD, R TKA, and dementia.   Clinical Impression  Pt admitted secondary to problem above with deficits below. Pt requiring min to min guard A for mobility tasks using RW. Per pt, she lives alone and does not have anyone to assist full time. Reports interest in SNF at d/c; notified MD. Will continue to follow acutely to maximize functional mobility independence and safety.     Follow Up Recommendations Follow surgeon's recommendation for DC plan and follow-up therapies;SNF    Equipment Recommendations  Rolling walker with 5" wheels    Recommendations for Other Services       Precautions / Restrictions Precautions Precautions: Knee Precaution Booklet Issued: No Precaution Comments: Verbally reviewed knee precautions.  Restrictions Weight Bearing Restrictions: Yes LLE Weight Bearing: Weight bearing as tolerated      Mobility  Bed Mobility Overal bed mobility: Needs Assistance Bed Mobility: Supine to Sit     Supine to sit: Min assist     General bed mobility comments: Min A For LLE assist. Increased time to come to EOB.     Transfers Overall transfer level: Needs assistance Equipment used: Rolling walker (2 wheeled) Transfers: Sit to/from Stand Sit to Stand: Min assist         General transfer comment: Min A for steadying assist to stand. Cues for hand placement.   Ambulation/Gait Ambulation/Gait assistance: Min guard Gait Distance (Feet): 5 Feet Assistive device: Rolling walker (2 wheeled) Gait Pattern/deviations: Step-to pattern;Decreased step length - right;Decreased step length - left;Decreased weight shift to left;Antalgic Gait velocity: Decreased   General Gait Details: Antalgic gait. Min guard for safety. Cues for sequencing using RW.    Stairs            Wheelchair Mobility    Modified Rankin (Stroke Patients Only)       Balance Overall balance assessment: Needs assistance Sitting-balance support: No upper extremity supported;Feet supported Sitting balance-Leahy Scale: Fair     Standing balance support: Bilateral upper extremity supported;During functional activity Standing balance-Leahy Scale: Poor Standing balance comment: Reliant on BUE support                              Pertinent Vitals/Pain Pain Assessment: Faces Faces Pain Scale: Hurts little more Pain Location: L knee  Pain Descriptors / Indicators: Throbbing;Operative site guarding Pain Intervention(s): Monitored during session;Limited activity within patient's tolerance;Repositioned    Home Living Family/patient expects to be discharged to:: Private residence Living Arrangements: Alone Available Help at Discharge: Family Type of Home: Apartment Home Access: Elevator     Home Layout: One level Home Equipment: Shower seat;Bedside commode;Walker - 4 wheels      Prior Function Level of Independence: Independent with assistive device(s)         Comments: Using rollator for ambulation      Hand Dominance        Extremity/Trunk Assessment   Upper Extremity Assessment Upper Extremity Assessment: Overall WFL for tasks assessed    Lower Extremity Assessment Lower Extremity Assessment: LLE deficits/detail LLE Deficits / Details: Deficits consistent with post op pain and weakness.     Cervical / Trunk Assessment Cervical / Trunk Assessment: Normal  Communication   Communication: No difficulties  Cognition Arousal/Alertness: Awake/alert  Behavior During Therapy: WFL for tasks assessed/performed Overall Cognitive Status: History of cognitive impairments - at baseline                                 General Comments: Per chart review, hx of dementia. WFL for basic tasks.       General Comments  General comments (skin integrity, edema, etc.): Pt's daughter present during session     Exercises Total Joint Exercises Ankle Circles/Pumps: AROM;Both;20 reps   Assessment/Plan    PT Assessment Patient needs continued PT services  PT Problem List Decreased strength;Decreased balance;Decreased mobility;Decreased activity tolerance;Decreased knowledge of use of DME;Decreased knowledge of precautions;Pain       PT Treatment Interventions DME instruction;Gait training;Functional mobility training;Therapeutic activities;Therapeutic exercise;Balance training;Patient/family education    PT Goals (Current goals can be found in the Care Plan section)  Acute Rehab PT Goals Patient Stated Goal: to go to SNF before discharging home PT Goal Formulation: With patient Time For Goal Achievement: 12/27/19 Potential to Achieve Goals: Good    Frequency 7X/week   Barriers to discharge        Co-evaluation               AM-PAC PT "6 Clicks" Mobility  Outcome Measure Help needed turning from your back to your side while in a flat bed without using bedrails?: A Little Help needed moving from lying on your back to sitting on the side of a flat bed without using bedrails?: A Little Help needed moving to and from a bed to a chair (including a wheelchair)?: A Little Help needed standing up from a chair using your arms (e.g., wheelchair or bedside chair)?: A Little Help needed to walk in hospital room?: A Little Help needed climbing 3-5 steps with a railing? : A Lot 6 Click Score: 17    End of Session Equipment Utilized During Treatment: Gait belt Activity Tolerance: Patient limited by pain Patient left: in chair;with call bell/phone within reach;with nursing/sitter in room;with family/visitor present Nurse Communication: Mobility status;Patient requests pain meds PT Visit Diagnosis: Other abnormalities of gait and mobility (R26.89);Muscle weakness (generalized) (M62.81);Pain Pain -  Right/Left: Left Pain - part of body: Knee    Time: 7622-6333 PT Time Calculation (min) (ACUTE ONLY): 27 min   Charges:   PT Evaluation $PT Eval Low Complexity: 1 Low PT Treatments $Therapeutic Activity: 8-22 mins        Lou Miner, DPT  Acute Rehabilitation Services  Pager: [phone] Office: [phone]   Rudean Hitt 12/13/2019, 4:16 PM

## 2019-12-13 NOTE — Plan of Care (Signed)
  Problem: Education: Goal: Knowledge of General Education information will improve Description: Including pain rating scale, medication(s)/side effects and non-pharmacologic comfort measures Outcome: Progressing   Problem: Health Behavior/Discharge Planning: Goal: Ability to manage health-related needs will improve Outcome: Progressing   Problem: Clinical Measurements: Goal: Ability to maintain clinical measurements within normal limits will improve Outcome: Progressing Goal: Will remain free from infection Outcome: Progressing Goal: Diagnostic test results will improve Outcome: Progressing Goal: Respiratory complications will improve Outcome: Progressing Goal: Cardiovascular complication will be avoided Outcome: Progressing   Problem: Coping: Goal: Level of anxiety will decrease Outcome: Progressing   Problem: Elimination: Goal: Will not experience complications related to bowel motility Outcome: Progressing Goal: Will not experience complications related to urinary retention Outcome: Progressing   Problem: Safety: Goal: Ability to remain free from injury will improve Outcome: Progressing   Problem: Skin Integrity: Goal: Risk for impaired skin integrity will decrease Outcome: Progressing   

## 2019-12-13 NOTE — Care Management Obs Status (Signed)
[place] NOTIFICATION   Patient Details  Name: Susan [name] MRN: [id number] Date of Birth: [birth year]   Medicare Observation Status Notification Given:  Yes    Curlene Labrum, RN 12/13/2019, 3:59 PM

## 2019-12-13 NOTE — Discharge Instructions (Signed)

## 2019-12-14 ENCOUNTER — Encounter (HOSPITAL_COMMUNITY): Payer: Self-pay | Admitting: Orthopaedic Surgery

## 2019-12-14 ENCOUNTER — Other Ambulatory Visit: Payer: Self-pay | Admitting: Physician Assistant

## 2019-12-14 DIAGNOSIS — M1712 Unilateral primary osteoarthritis, left knee: Secondary | ICD-10-CM | POA: Diagnosis not present

## 2019-12-14 LAB — BASIC METABOLIC PANEL
Anion gap: 9 (ref 5–15)
BUN: 20 mg/dL (ref 8–23)
CO2: 24 mmol/L (ref 22–32)
Calcium: 9.4 mg/dL (ref 8.9–10.3)
Chloride: 106 mmol/L (ref 98–111)
Creatinine, Ser: 1.4 mg/dL — ABNORMAL HIGH (ref 0.44–1.00)
GFR, Estimated: 38 mL/min — ABNORMAL LOW (ref >60)
Glucose, Bld: 146 mg/dL — ABNORMAL HIGH (ref 70–99)
Potassium: 3.7 mmol/L (ref 3.5–5.1)
Sodium: 139 mmol/L (ref 135–145)

## 2019-12-14 LAB — CBC
HCT: 28.8 % — ABNORMAL LOW (ref 36.0–46.0)
Hemoglobin: 9.8 g/dL — ABNORMAL LOW (ref 12.0–15.0)
MCH: 32 pg (ref 26.0–34.0)
MCHC: 34 g/dL (ref 30.0–36.0)
MCV: 94.1 fL (ref 80.0–100.0)
Platelets: 197 10*3/uL (ref 150–400)
RBC: 3.06 MIL/uL — ABNORMAL LOW (ref 3.87–5.11)
RDW: 12.9 % (ref 11.5–15.5)
WBC: 5.6 10*3/uL (ref 4.0–10.5)
nRBC: 0 % (ref 0.0–0.2)

## 2019-12-14 MED ORDER — METHOCARBAMOL 500 MG PO TABS
500.0000 mg | ORAL_TABLET | Freq: Two times a day (BID) | ORAL | 0 refills | Status: DC | PRN
Start: 2019-12-14 — End: 2022-10-24

## 2019-12-14 MED ORDER — OXYCODONE-ACETAMINOPHEN 5-325 MG PO TABS
1.0000 | ORAL_TABLET | Freq: Four times a day (QID) | ORAL | 0 refills | Status: DC | PRN
Start: 2019-12-14 — End: 2019-12-20

## 2019-12-14 MED ORDER — ONDANSETRON HCL 4 MG PO TABS: 4.0000 mg | ORAL_TABLET | Freq: Three times a day (TID) | ORAL | 0 refills | Status: DC | PRN

## 2019-12-14 MED ORDER — METHOCARBAMOL 500 MG PO TABS: 500.0000 mg | ORAL_TABLET | Freq: Two times a day (BID) | ORAL | 0 refills | Status: DC | PRN

## 2019-12-14 MED ORDER — OXYCODONE-ACETAMINOPHEN 5-325 MG PO TABS
1.0000 | ORAL_TABLET | Freq: Four times a day (QID) | ORAL | 0 refills | Status: DC | PRN
Start: 2019-12-14 — End: 2019-12-17

## 2019-12-14 NOTE — Progress Notes (Addendum)
Subjective: 1 Day Post-Op Procedure(s) (LRB): LEFT TOTAL KNEE ARTHROPLASTY (Left) Patient reports pain as mild.    Objective: Vital signs in last 24 hours: Temp:  [97.4 F (36.3 C)-99 F (37.2 C)] 99 F (37.2 C) (12/07 0232) Pulse Rate:  [46-61] 53 (12/07 0232) Resp:  [12-19] 18 (12/07 0232) BP: (125-187)/(53-87) 145/60 (12/07 0232) SpO2:  [95 %-100 %] 95 % (12/07 0232) Weight:  [75.6 kg] 75.6 kg (12/06 0957)  Intake/Output from previous day: 12/06 0701 - 12/07 0700 In: 2115.5 [P.O.:100; I.V.:1915.5; IV Piggyback:100] Out: 1700 [Urine:1650; Blood:50] Intake/Output this shift: No intake/output data recorded.  Recent Labs    12/14/19 0451  HGB 9.8*   Recent Labs    12/14/19 0451  WBC 5.6  RBC 3.06*  HCT 28.8*  PLT 197   Recent Labs    12/14/19 0451  NA 139  K 3.7  CL 106  CO2 24  BUN 20  CREATININE 1.40*  GLUCOSE 146*  CALCIUM 9.4   No results for input(s): LABPT, INR in the last 72 hours.  Neurologically intact Neurovascular intact Sensation intact distally Intact pulses distally Dorsiflexion/Plantar flexion intact Incision: dressing C/D/I No cellulitis present Compartment soft   Assessment/Plan: 1 Day Post-Op Procedure(s) (LRB): LEFT TOTAL KNEE ARTHROPLASTY (Left) Advance diet Up with therapy Discharge to SNF once insurance approves and bed available WBAT LLE ABLA- mild and stable Continue fluids d/t decreased renal function covid test for snf has been placed Anticipated LOS equal to or greater than 2 midnights due to - Age 81 and older with one or more of the following:  - Obesity  - Expected need for hospital services (PT, OT, Nursing) required for safe  discharge  - Anticipated need for postoperative skilled nursing care or inpatient rehab  - Active co-morbidities: Stroke OR   - Unanticipated findings during/Post Surgery: Slow post-op progression: GI, pain control, mobility  - Patient is a high risk of re-admission due to: Barriers to  post-acute care (logistical, no family support in home)    Aundra Dubin 12/14/2019, 7:14 AM

## 2019-12-14 NOTE — Progress Notes (Signed)
Physical Therapy Treatment Patient Details Name: Susan [name] MRN: [id number] DOB: October 14, 1940 Today's Date: 12/14/2019    History of Present Illness Pt is an 79 y/o female s/p L TKA on 12/13/19. PMH includes dCHF, CVA, OSA on CPAP, MDD, R TKA, dementia.   PT Comments    Pt progressing with mobility. Today's session focused on transfer and gait training with RW, pt requiring up to [facility] for standing. Pt pleasant and motivated to participate; apparent short-term memory deficits (noted h/o dementia) requiring frequent cues for technique, sequencing and safety. Pt's daughter pleasant and supportive. Pt remains limited by generalized weakness, pain, impaired balance and decreased activity tolerance; continue to recommend SNF-level therapies to maximize functional mobility and independence prior to return home; pt and family in agreement.   Follow Up Recommendations  SNF;Supervision/Assistance - 24 hour;Follow surgeon's recommendation for DC plan and follow-up therapies     Equipment Recommendations  Rolling walker with 5" wheels;3in1 (PT);Wheelchair (measurements PT);Wheelchair cushion (measurements PT)    Recommendations for Other Services       Precautions / Restrictions Precautions Precautions: Knee;Fall Restrictions Weight Bearing Restrictions: Yes LLE Weight Bearing: Weight bearing as tolerated    Mobility  Bed Mobility               General bed mobility comments: Received sitting EOB  Transfers Overall transfer level: Needs assistance Equipment used: Rolling walker (2 wheeled) Transfers: Sit to/from Stand Sit to Stand: Mod assist;Min assist         General transfer comment: Initial modA to stand from EOB to RW, performed 5x additional trials from [facility] and recliner with min-modA; pt requires frequent cues for sequencing (especially knee flexion) prior to standing and sitting, some carryover when cued  Ambulation/Gait Ambulation/Gait assistance: Min guard Gait  Distance (Feet): 24 Feet Assistive device: Rolling walker (2 wheeled) Gait Pattern/deviations: Step-to pattern;Step-through pattern;Decreased stride length;Trunk flexed;Antalgic Gait velocity: Decreased   General Gait Details: Slow, antalgic gait from bed to [facility] then recliner, with an additional 18' to doorway, required prolonged seated rest breaks between activity bouts, then additional steps from recliner<>BSC; frequent cues for sequencing and safety, including upright posture, heel-to-toe gait pattern and step-through pattern   Stairs             Wheelchair Mobility    Modified Rankin (Stroke Patients Only)       Balance Overall balance assessment: Needs assistance Sitting-balance support: No upper extremity supported;Feet supported Sitting balance-Leahy Scale: Fair     Standing balance support: Bilateral upper extremity supported;During functional activity;Single extremity supported Standing balance-Leahy Scale: Poor Standing balance comment: Reliant on single UE support to perform pericare while standing                            Cognition Arousal/Alertness: Awake/alert Behavior During Therapy: WFL for tasks assessed/performed Overall Cognitive Status: History of cognitive impairments - at baseline                                 General Comments: H/o dementia. Pt easily distracted with apparent short-term memory deficits, requiring frequent cues to task. Demonstrating some good carryover this session with intermittent cues. Pleasant and motivated      Exercises      General Comments General comments (skin integrity, edema, etc.): Daughter present and supportive      Pertinent Vitals/Pain Pain Assessment: Faces Faces Pain Scale: Hurts even  more Pain Location: L knee  Pain Descriptors / Indicators: Discomfort;Grimacing;Guarding Pain Intervention(s): Monitored during session;Limited activity within patient's tolerance;Patient  requesting pain meds-RN notified;Ice applied    Home Living                      Prior Function            PT Goals (current goals can now be found in the care plan section) Progress towards PT goals: Progressing toward goals    Frequency    7X/week      PT Plan Current plan remains appropriate    Co-evaluation              AM-PAC PT "6 Clicks" Mobility   Outcome Measure  Help needed turning from your back to your side while in a flat bed without using bedrails?: A Little Help needed moving from lying on your back to sitting on the side of a flat bed without using bedrails?: A Little Help needed moving to and from a bed to a chair (including a wheelchair)?: A Little Help needed standing up from a chair using your arms (e.g., wheelchair or bedside chair)?: A Lot Help needed to walk in hospital room?: A Little Help needed climbing 3-5 steps with a railing? : A Lot 6 Click Score: 16    End of Session Equipment Utilized During Treatment: Gait belt Activity Tolerance: Patient tolerated treatment well Patient left: in chair;with call bell/phone within reach;with family/visitor present;with chair alarm set Nurse Communication: Mobility status;Patient requests pain meds PT Visit Diagnosis: Other abnormalities of gait and mobility (R26.89);Muscle weakness (generalized) (M62.81);Pain Pain - Right/Left: Left Pain - part of body: Knee     Time: 1601-0932 PT Time Calculation (min) (ACUTE ONLY): 44 min  Charges:  $Gait Training: 8-22 mins $Therapeutic Activity: 23-37 mins                    Mabeline Caras, PT, DPT Acute Rehabilitation Services  Pager [phone] Office [place] 12/14/2019, 5:55 PM

## 2019-12-14 NOTE — Plan of Care (Signed)

## 2019-12-14 NOTE — Plan of Care (Signed)
  Problem: Education: Goal: Knowledge of General Education information will improve Description: Including pain rating scale, medication(s)/side effects and non-pharmacologic comfort measures Outcome: Progressing   Problem: Health Behavior/Discharge Planning: Goal: Ability to manage health-related needs will improve Outcome: Progressing   Problem: Coping: Goal: Level of anxiety will decrease Outcome: Progressing   Problem: Elimination: Goal: Will not experience complications related to bowel motility Outcome: Progressing   Problem: Pain Managment: Goal: General experience of comfort will improve Outcome: Progressing   Problem: Safety: Goal: Ability to remain free from injury will improve Outcome: Progressing   Problem: Skin Integrity: Goal: Risk for impaired skin integrity will decrease Outcome: Progressing

## 2019-12-14 NOTE — NC FL2 (Signed)
[place] LEVEL OF CARE SCREENING TOOL     IDENTIFICATION  Patient Name: Susan [name] Birthdate: April 17, 1940 Sex: female Admission Date (Current Location): 12/13/2019  [facility] [place] Number:  Herbalist and Address:  [facility] [place] [address] [place], [place] [address]      Provider Number: [id number]  Attending Physician Name and Address:  Leandrew Koyanagi, MD  Relative Name and Phone Number:  Shelley Cocke, daughter, [phone]    Current Level of Care: Hospital Recommended Level of Care: [place] Prior Approval Number:    Date Approved/Denied:   PASRR Number: [id number]  Discharge Plan: SNF    Current Diagnoses: Patient Active Problem List   Diagnosis Date Noted  . 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  . 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 Anemia of chronic disease 09/07/2018  . Primary osteoarthritis of left knee 06/16/2018  . Venous stasis dermatitis of left lower extremity 04/29/2018  . Dementia ([place]) 03/12/2018  . Recurrent cellulitis 02/12/2018  . HTN (hypertension) 02/12/2018  . Cellulitis and abscess of leg 12/29/2017  . CKD (chronic kidney disease) stage 3, GFR 30-59 ml/min (HCC) 12/29/2017  . Cellulitis of left lower extremity   . Ankle edema   . Sepsis ([place]) 12/21/2017  . Medication management 10/11/2016  . Hyperlipemia 06/18/2016  . Gait abnormality 06/18/2016  . Major depressive disorder, recurrent episode, severe ([place]) 11/21/2015  . Edema 02/09/2015  . History of CVA (cerebrovascular accident) 02/01/2015  . 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 ([place])   . 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 Chronic low back pain 01/28/2015  . Short-term memory loss 01/28/2015  . Volume depletion 01/28/2015  . Persistent atrial fibrillation 01/26/2015  . DCM (dilated cardiomyopathy) ([place]) 01/26/2015  . TIA  (transient ischemic attack) 01/24/2015  . Chronic diastolic (congestive) heart failure ([place])   . Obesity (BMI 30-39.9) 01/27/2013  . Constipation 09/02/2012  . H/O arthroscopy of right knee 08/06/2012  . Anxiety   . Depression   . GERD (gastroesophageal reflux disease)   . Hypertensive heart disease   . OSA (obstructive sleep apnea)   . Osteoarthritis of right knee   . Hyperparathyroidism, primary ([place]) 04/22/2012  . Chronic cough 10/02/2011    Orientation RESPIRATION BLADDER Height & Weight     Self, Time, Situation, Place  Normal Continent Weight: 75.6 kg Height:  5\' 1"  (154.9 cm)  BEHAVIORAL SYMPTOMS/MOOD NEUROLOGICAL BOWEL NUTRITION STATUS      Continent Diet (See discharge summary)  AMBULATORY STATUS COMMUNICATION OF NEEDS Skin   Limited Assist Verbally Surgical wounds                       Personal Care Assistance Level of Assistance  Bathing, Feeding, Dressing Bathing Assistance: Limited assistance Feeding assistance: Independent Dressing Assistance: Limited assistance     Functional Limitations Info  Sight, Hearing, Speech Sight Info: Impaired (wears glasses) Hearing Info: Adequate Speech Info: Adequate    SPECIAL CARE FACTORS FREQUENCY  PT (By licensed PT), OT (By licensed OT)     PT Frequency: 5 x per week OT Frequency: 5 x per week            Contractures Contractures Info: Not present    Additional Factors Info  Code Status, Allergies, Psychotropic Code Status Info: Full code Allergies Info: ace inhibitors, lipitor, simvastatin, latex Psychotropic Info: wellbutrin, buspar, aricept, zoloft  Current Medications (12/14/2019):  This is the current hospital active medication list Current Facility-Administered Medications  Medication Dose Route Frequency Provider Last Rate Last Admin  . 0.9 %  sodium chloride infusion   Intravenous Continuous Leandrew Koyanagi, MD 125 mL/hr at 12/13/19 1516 New Bag at 12/13/19 1516  . acetaminophen  (TYLENOL) tablet 325-650 mg  325-650 mg Oral Q6H PRN Leandrew Koyanagi, MD      . alum & mag hydroxide-simeth (MAALOX/MYLANTA) 200-200-20 MG/5ML suspension 30 mL  30 mL Oral Q4H PRN Leandrew Koyanagi, MD      . apixaban Arne [place]) tablet 5 mg  5 mg Oral Q12H Leandrew Koyanagi, MD   5 mg at 12/14/19 1157  . buPROPion (WELLBUTRIN XL) 24 hr tablet 150 mg  150 mg Oral Daily Leandrew Koyanagi, MD   150 mg at 12/14/19 1202  . busPIRone (BUSPAR) tablet 15 mg  15 mg Oral BID Leandrew Koyanagi, MD   15 mg at 12/14/19 1159  . carvedilol (COREG) tablet 12.5 mg  12.5 mg Oral BID WC Leandrew Koyanagi, MD   12.5 mg at 12/14/19 1158  . diphenhydrAMINE (BENADRYL) 12.5 MG/5ML elixir 25 mg  25 mg Oral Q4H PRN Leandrew Koyanagi, MD      . docusate sodium (COLACE) capsule 100 mg  100 mg Oral BID Leandrew Koyanagi, MD   100 mg at 12/14/19 1158  . donepezil (ARICEPT) tablet 10 mg  10 mg Oral QHS Leandrew Koyanagi, MD   10 mg at 12/13/19 2204  . hydrALAZINE (APRESOLINE) tablet 25 mg  25 mg Oral TID Leandrew Koyanagi, MD   25 mg at 12/14/19 1200  . HYDROmorphone (DILAUDID) injection 0.5-1 mg  0.5-1 mg Intravenous Q4H PRN Leandrew Koyanagi, MD   1 mg at 12/13/19 1718  . ketorolac (TORADOL) 15 MG/ML injection 15 mg  15 mg Intravenous Q6H Leandrew Koyanagi, MD   15 mg at 12/14/19 1207  . losartan (COZAAR) tablet 50 mg  50 mg Oral Daily Leandrew Koyanagi, MD   50 mg at 12/14/19 1159  . magnesium citrate solution 1 Bottle  1 Bottle Oral Once PRN Leandrew Koyanagi, MD      . menthol-cetylpyridinium (CEPACOL) lozenge 3 mg  1 lozenge Oral PRN Leandrew Koyanagi, MD       Or  . phenol (CHLORASEPTIC) mouth spray 1 spray  1 spray Mouth/Throat PRN Leandrew Koyanagi, MD      . methocarbamol (ROBAXIN) tablet 500 mg  500 mg Oral Q6H PRN Leandrew Koyanagi, MD       Or  . methocarbamol (ROBAXIN) 500 mg in dextrose 5 % 50 mL IVPB  500 mg Intravenous Q6H PRN Leandrew Koyanagi, MD      . metoCLOPramide (REGLAN) tablet 5-10 mg  5-10 mg Oral Q8H PRN Leandrew Koyanagi, MD       Or  . metoCLOPramide (REGLAN) injection  5-10 mg  5-10 mg Intravenous Q8H PRN Leandrew Koyanagi, MD      . ondansetron [facility]) tablet 4 mg  4 mg Oral Q6H PRN Leandrew Koyanagi, MD       Or  . ondansetron [facility]) injection 4 mg  4 mg Intravenous Q6H PRN Leandrew Koyanagi, MD      . oxyCODONE (Oxy IR/ROXICODONE) immediate release tablet 10-15 mg  10-15 mg Oral Q4H PRN Leandrew Koyanagi, MD      . oxyCODONE (Oxy IR/ROXICODONE) immediate release tablet 5-10  mg  5-10 mg Oral Q4H PRN Leandrew Koyanagi, MD   10 mg at 12/13/19 1546  . oxyCODONE (OXYCONTIN) 12 hr tablet 10 mg  10 mg Oral Q12H Leandrew Koyanagi, MD   10 mg at 12/14/19 1156  . polyethylene glycol (MIRALAX / GLYCOLAX) packet 17 g  17 g Oral Daily PRN Leandrew Koyanagi, MD      . saccharomyces boulardii (FLORASTOR) capsule 250 mg  250 mg Oral BID Leandrew Koyanagi, MD   250 mg at 12/14/19 1200  . sertraline (ZOLOFT) tablet 100 mg  100 mg Oral Daily Leandrew Koyanagi, MD   100 mg at 12/14/19 1159  . sorbitol 70 % solution 30 mL  30 mL Oral Daily PRN Leandrew Koyanagi, MD         Discharge Medications: Please see discharge summary for a list of discharge medications.  Relevant Imaging Results:  Relevant Lab Results:   Additional Information SSN: [id number]  Curlene Labrum, RN

## 2019-12-14 NOTE — TOC Initial Note (Addendum)
Transition of Care [facility]) - Initial/Assessment Note    Patient Details  Name: Susan [name] MRN: [id number] Date of Birth: 08/14/1940  Transition of Care [facility]) CM/SW Contact:    Curlene Labrum, RN Phone Number: 12/14/2019, 12:20 PM  Clinical Narrative:                 Case management met with the patient and daughter at the bedside to discuss transitions of care to a SNF facility.  The patient lives at home alone and is a S/P Left TKA by Dr. Erlinda Hong.  I spoke with the patient regarding Medicare observation status and the patient and daughter verbalize understanding and was given Medicare notice.  I spoke with the patient and family and given Medicare choice regarding SNF placement and they gave me permission to place the patient in the hub for [facility] placement.  The daughter states that she does not want the patient to go to [facility].  The patient is fully vaccinated for COVID with Moderna - vaccines x 2 plus a booster.   SNF work up was started and I will offer the patient and family choice of a facility when the beds are available.  I called [facility] and started insurance authorization with a pending bed choice from the family - clinicals were faxed to [facility] at [phone].  Will continue to follow for SNF placement.  Expected Discharge Plan: Skilled Nursing Facility Barriers to Discharge: No Barriers Identified, Continued Medical Work up   Patient Goals and CMS Choice Patient states their goals for this hospitalization and ongoing recovery are:: Patient plans to transfer to a SNF - lives at home alone. CMS Medicare.gov Compare Post Acute Care list provided to:: Patient Choice offered to / list presented to : Patient  Expected Discharge Plan and Services Expected Discharge Plan: [place]   Discharge Planning Services: CM Consult Post Acute Care Choice: [facility] [facility] Living arrangements for the past 2 months: Single Family Home                                       Prior Living Arrangements/Services Living arrangements for the past 2 months: Single Family Home Lives with:: Self Patient language and need for interpreter reviewed:: Yes Do you feel safe going back to the place where you live?: Yes      Need for Family Participation in Patient Care: Yes (Comment) Care giver support system in place?: Yes (comment) Current home services: DME Criminal Activity/Legal Involvement Pertinent to Current Situation/Hospitalization: No - Comment as needed  Activities of Daily Living Home Assistive Devices/Equipment: Gilford Rile (specify type) ADL Screening (condition at time of admission) Patient's cognitive ability adequate to safely complete daily activities?: No Is the patient deaf or have difficulty hearing?: No Does the patient have difficulty seeing, even when wearing glasses/contacts?: No Does the patient have difficulty concentrating, remembering, or making decisions?: No Patient able to express need for assistance with ADLs?: Yes Does the patient have difficulty dressing or bathing?: Yes Independently performs ADLs?: No Communication: Independent Dressing (OT): Needs assistance Is this a change from baseline?: Change from baseline, expected to last >3 days Grooming: Needs assistance Is this a change from baseline?: Change from baseline, expected to last >3 days Feeding: Independent Bathing: Needs assistance Is this a change from baseline?: Change from baseline, expected to last >3 days Toileting: Needs assistance Is this a  change from baseline?: Change from baseline, expected to last >3days In/Out Bed: Needs assistance Is this a change from baseline?: Change from baseline, expected to last >3 days Walks in Home: Independent with device (comment) Does the patient have difficulty walking or climbing stairs?: Yes Weakness of Legs: Both Weakness of Arms/Hands: None  Permission Sought/Granted Permission sought to  share information with : Case Manager Permission granted to share information with : Yes, Verbal Permission Granted     Permission granted to share info w AGENCY: SNF facility  Permission granted to share info w Relationship: daughter - [name]     Emotional Assessment Appearance:: Appears stated age Attitude/Demeanor/Rapport: Engaged Affect (typically observed): Accepting Orientation: : Oriented to Self, Oriented to Place, Oriented to  Time, Oriented to Situation Alcohol / Substance Use: Not Applicable Psych Involvement: No (comment)  Admission diagnosis:  Status post total left knee replacement [Z96.652] Patient Active Problem List   Diagnosis Date Noted  . 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  . 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 Anemia of chronic disease 09/07/2018  . Primary osteoarthritis of left knee 06/16/2018  . Venous stasis dermatitis of left lower extremity 04/29/2018  . Dementia ([place]) 03/12/2018  . Recurrent cellulitis 02/12/2018  . HTN (hypertension) 02/12/2018  . Cellulitis and abscess of leg 12/29/2017  . CKD (chronic kidney disease) stage 3, GFR 30-59 ml/min (HCC) 12/29/2017  . Cellulitis of left lower extremity   . Ankle edema   . Sepsis ([place]) 12/21/2017  . Medication management 10/11/2016  . Hyperlipemia 06/18/2016  . Gait abnormality 06/18/2016  . Major depressive disorder, recurrent episode, severe ([place]) 11/21/2015  . Edema 02/09/2015  . History of CVA (cerebrovascular accident) 02/01/2015  . 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 ([place])   . 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 Chronic low back pain 01/28/2015  . Short-term memory loss 01/28/2015  . Volume depletion 01/28/2015  . Persistent atrial fibrillation 01/26/2015  . DCM (dilated cardiomyopathy) ([place]) 01/26/2015  . TIA (transient ischemic attack) 01/24/2015  . Chronic diastolic (congestive) heart failure ([place])    . Obesity (BMI 30-39.9) 01/27/2013  . Constipation 09/02/2012  . H/O arthroscopy of right knee 08/06/2012  . Anxiety   . Depression   . GERD (gastroesophageal reflux disease)   . Hypertensive heart disease   . OSA (obstructive sleep apnea)   . Osteoarthritis of right knee   . Hyperparathyroidism, primary ([place]) 04/22/2012  . Chronic cough 10/02/2011   PCP:  Sonia Side., FNP Pharmacy:   [facility]- [place], [place] - [address] [place] [address] Phone: [phone] Fax: [phone]  [facility] - [place], [place] - [address] [place] [address] Phone: [phone] Fax: [phone]     Social Determinants of Health ([place]) Interventions    Readmission Risk Interventions No flowsheet data found.

## 2019-12-15 DIAGNOSIS — M1712 Unilateral primary osteoarthritis, left knee: Secondary | ICD-10-CM | POA: Diagnosis not present

## 2019-12-15 NOTE — TOC Progression Note (Signed)
Transition of Care [facility]) - Progression Note    Patient Details  Name: Susan [name] MRN: [id number] Date of Birth: 12/09/1940  Transition of Care [facility]) CM/SW [place], RN Phone Number: 12/15/2019, 11:44 AM  Clinical Narrative:    Case management spoke with the patient and daughter, Sharee Pimple at the bedside and gave Medicare choice regarding SNF placement.  The patient and daughter chose [facility] in [place] and they will have a SNF bed available tomorrow for the patient's transfer to the facility.  The COVID was completed yesterday so she should not need another screen.  I called [facility] for and updated insurance on the facility choice - patient has been approved for [facility] placement.  I will continue to follow the patient for discharge to the facility tomorrow.   Expected Discharge Plan: [place] Barriers to Discharge: No Barriers Identified, Continued Medical Work up  Expected Discharge Plan and Services Expected Discharge Plan: [place]   Discharge Planning Services: CM Consult Post Acute Care Choice: [place] Living arrangements for the past 2 months: Single Family Home                                       Social Determinants of Health (SDOH) Interventions    Readmission Risk Interventions No flowsheet data found.

## 2019-12-15 NOTE — Plan of Care (Signed)
  Problem: Education: Goal: Knowledge of General Education information will improve Description Including pain rating scale, medication(s)/side effects and non-pharmacologic comfort measures Outcome: Progressing   Problem: Clinical Measurements: Goal: Respiratory complications will improve Outcome: Progressing   Problem: Activity: Goal: Risk for activity intolerance will decrease Outcome: Progressing   Problem: Nutrition: Goal: Adequate nutrition will be maintained Outcome: Progressing   Problem: Coping: Goal: Level of anxiety will decrease Outcome: Progressing   Problem: Pain Managment: Goal: General experience of comfort will improve Outcome: Progressing   Problem: Safety: Goal: Ability to remain free from injury will improve Outcome: Progressing   

## 2019-12-15 NOTE — Progress Notes (Signed)
   Subjective:  Patient reports pain as mild.   Requesting the CPM flexion to be dialed down a little.  Objective:   VITALS:   Vitals:   12/14/19 0731 12/14/19 1332 12/14/19 1943 12/15/19 0500  BP: 138/62 135/62 (!) 157/70 (!) 145/72  Pulse: 62 70 63 60  Resp: 18 17 18 17   Temp: 98.4 F (36.9 C) 98.1 F (36.7 C) 98.9 F (37.2 C) 98 F (36.7 C)  TempSrc: Oral Oral    SpO2: 98% 98% 100% 98%  Weight:      Height:        Sensation intact distally Intact pulses distally Dorsiflexion/Plantar flexion intact Incision: dressing C/D/I and no drainage No cellulitis present   Lab Results  Component Value Date   WBC 5.6 12/14/2019   HGB 9.8 (L) 12/14/2019   HCT 28.8 (L) 12/14/2019   MCV 94.1 12/14/2019   PLT 197 12/14/2019     Assessment/Plan:  2 Days Post-Op   - Expected postop acute blood loss anemia - will monitor for symptoms - Up with PT/OT, continue CPM - DVT ppx - SCDs, ambulation, eliquis - WBAT operative extremity - Pain control - Anticipate d/c to SNF - Covid test pending for SNF - Rx in chart  Eduard Roux 12/15/2019, 7:41 AM

## 2019-12-15 NOTE — Progress Notes (Signed)
Physical Therapy Treatment Patient Details Name: Susan [name] MRN: [id number] DOB: 01/14/1940 Today's Date: 12/15/2019    History of Present Illness Pt is an 79 y/o female s/p L TKA on 12/13/19. PMH includes dCHF, CVA, OSA on CPAP, MDD, R TKA, dementia.   PT Comments    Pt is slowly progressing with mobility. Pt requiring increased assist for transfer and gait training with RW, limited by pain, weakness and fatigue. Remains motivated to participate and planning for d/c to SNF tomorrow. Will continue to follow acutely.   Follow Up Recommendations  SNF;Supervision/Assistance - 24 hour;Follow surgeon's recommendation for DC plan and follow-up therapies     Equipment Recommendations  Rolling walker with 5" wheels;3in1 (PT);Wheelchair (measurements PT);Wheelchair cushion (measurements PT)    Recommendations for Other Services      Precautions / Restrictions Precautions Precautions: Knee;Fall;Other (comment) Precaution Comments: Urinary frequency Restrictions Weight Bearing Restrictions: Yes LLE Weight Bearing: Weight bearing as tolerated    Mobility  Bed Mobility Overal bed mobility: Needs Assistance Bed Mobility: Supine to Sit     Supine to sit: Mod assist     General bed mobility comments: ModA for LLE management, increased time and effort requiring frequent cues to stay on task. Pt reports, "I know, I'm just taking a rest break"  Transfers Overall transfer level: Needs assistance Equipment used: Rolling walker (2 wheeled) Transfers: Sit to/from Stand Sit to Stand: Mod assist         General transfer comment: Cues for set-up, pt with difficulty flexing knees enough to allow for easier standing; heavy modA to assist trunk elevation, as well as eccentric control into sitting  Ambulation/Gait Ambulation/Gait assistance: Mod assist;Min assist Gait Distance (Feet): 2 Feet Assistive device: Rolling walker (2 wheeled)   Gait velocity: Decreased Gait velocity  interpretation: <1.31 ft/sec, indicative of household ambulator General Gait Details: Significant increased time and effort to initiate weight shifts to take steps, modA to maintain upright when accepting weight onto RLE in order to step with L foot, progressing to minA; taking >5-min to walk 2' with frequent cues for sequencing and safety; of note, pt's first time OOB today could be contributing factor to this   Stairs             Wheelchair Mobility    Modified Rankin (Stroke Patients Only)       Balance Overall balance assessment: Needs assistance Sitting-balance support: No upper extremity supported;Feet supported Sitting balance-Leahy Scale: Fair       Standing balance-Leahy Scale: Poor                              Cognition Arousal/Alertness: Awake/alert Behavior During Therapy: WFL for tasks assessed/performed Overall Cognitive Status: History of cognitive impairments - at baseline                                 General Comments: H/o dementia. Pt easily distracted with apparent short-term memory deficits, requiring frequent cues to task. Demonstrating some good carryover from education last session with intermittent cues. Pleasant and motivated      Exercises General Exercises - Lower Extremity Long Arc Quad: AAROM;Left;Seated (with slow, eccentric lowering)    General Comments        Pertinent Vitals/Pain Pain Assessment: Faces Faces Pain Scale: Hurts even more Pain Location: L knee  Pain Descriptors / Indicators: Discomfort;Grimacing;Guarding Pain Intervention(s): Monitored during session;Limited  activity within patient's tolerance;Repositioned;Ice applied    Home Living                      Prior Function            PT Goals (current goals can now be found in the care plan section) Progress towards PT goals: Progressing toward goals (slowly)    Frequency    7X/week      PT Plan Current plan remains  appropriate    Co-evaluation              AM-PAC PT "6 Clicks" Mobility   Outcome Measure  Help needed turning from your back to your side while in a flat bed without using bedrails?: A Little Help needed moving from lying on your back to sitting on the side of a flat bed without using bedrails?: A Lot Help needed moving to and from a bed to a chair (including a wheelchair)?: A Lot Help needed standing up from a chair using your arms (e.g., wheelchair or bedside chair)?: A Lot Help needed to walk in hospital room?: A Lot Help needed climbing 3-5 steps with a railing? : Total 6 Click Score: 12    End of Session Equipment Utilized During Treatment: Gait belt Activity Tolerance: Patient tolerated treatment well;Patient limited by fatigue;Patient limited by pain Patient left: in chair;with call bell/phone within reach;with chair alarm set Nurse Communication: Mobility status PT Visit Diagnosis: Other abnormalities of gait and mobility (R26.89);Muscle weakness (generalized) (M62.81);Pain Pain - Right/Left: Left Pain - part of body: Knee     Time: 1356-1420 PT Time Calculation (min) (ACUTE ONLY): 24 min  Charges:  $Gait Training: 8-22 mins $Therapeutic Activity: 8-22 mins                     Mabeline Caras, PT, DPT Acute Rehabilitation Services  Pager [phone] [place] 12/15/2019, 2:43 PM

## 2019-12-16 DIAGNOSIS — M1712 Unilateral primary osteoarthritis, left knee: Secondary | ICD-10-CM | POA: Diagnosis not present

## 2019-12-16 NOTE — Discharge Summary (Signed)
Patient ID: Susan [name] MRN: [id number] DOB/AGE: 07-26-1940 79 y.o.  Admit date: 12/13/2019 Discharge date: 12/16/2019  Admission Diagnoses:  Primary osteoarthritis of left knee  Discharge Diagnoses:  Principal Problem:   Primary osteoarthritis of left knee Active Problems:   Status post total left knee replacement   Past Medical History:  Diagnosis Date  . Anxiety   . Arthritis    "legs, back" (07/29/2012)  . Cellulitis and abscess of leg 12/29/2017  . Chronic diastolic (congestive) heart failure ([place])   . Chronic lower back pain   . Complication of anesthesia    "I have apnea" (07/29/2012)  . Dementia ([place]) 03/12/2018  . 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 Family history of anesthesia complication    "some wake up during OR; some are hard to wake up; some both" (07/29/2012)  . GERD (gastroesophageal reflux disease)   . 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 ([place])   . History of CVA (cerebrovascular accident) 02/01/2015  . History of stomach ulcers 1980's  . Hyperlipidemia   . Hyperparathyroidism, primary ([place]) 04/22/2012  . Hypertensive heart disease   . Incontinent of urine    wears pads  . Major depressive disorder, recurrent episode, severe ([place]) 11/21/2015  . OSA on CPAP   . Osteoarthritis of right knee   . Pedal edema   . Persistent atrial fibrillation (HCC)    CHADS2VASC score is 7 - on chronic anticoagulation with Apixaban  . Spinal stenosis   . Umbilical hernia    "unrepaired" (07/29/2012)  . Varicose veins    "BLE" (07/29/2012)  . Vascular dementia ([place])   . Venous stasis dermatitis of left lower extremity 04/29/2018    Surgeries: Procedure(s): LEFT TOTAL KNEE ARTHROPLASTY on 12/13/2019   Consultants (if any):   Discharged Condition: Improved  Hospital Course: Susan [name] is an 79 y.o. female who was admitted 12/13/2019 with a diagnosis of Primary osteoarthritis of left knee and went to the operating room on  12/13/2019 and underwent the above named procedures.    She was given perioperative antibiotics:  Anti-infectives (From admission, onward)   Start     Dose/Rate Route Frequency Ordered Stop   12/13/19 1600  ceFAZolin (ANCEF) IVPB 2g/100 mL premix        2 g 200 mL/hr over 30 Minutes Intravenous Every 6 hours 12/13/19 1509 12/13/19 2234   12/13/19 1204  vancomycin (VANCOCIN) powder  Status:  Discontinued          As needed 12/13/19 1204 12/13/19 1320   12/13/19 0945  ceFAZolin (ANCEF) IVPB 2g/100 mL premix        2 g 200 mL/hr over 30 Minutes Intravenous On call to O.R. 12/13/19 0938 12/13/19 1141   12/13/19 0940  ceFAZolin (ANCEF) 2-4 GM/100ML-% IVPB       Note to Pharmacy: Granville Lewis, Lindsi   : cabinet override      12/13/19 0940 12/13/19 1140    .  She was given sequential compression devices, early ambulation, and appropriate chemoprophylaxis for DVT prophylaxis.  She benefited maximally from the hospital stay and there were no complications.    Recent vital signs:  Vitals:   12/15/19 1916 12/16/19 0300  BP: 134/77 129/81  Pulse: 69 66  Resp: 16 16  Temp: 99.3 F (37.4 C) 98.2 F (36.8 C)  SpO2: 96% 95%    Recent laboratory studies:  Lab Results  Component Value Date   HGB 9.8 (L) 12/14/2019  HGB 11.2 (L) 12/08/2019   HGB 11.1 (L) 11/29/2019   Lab Results  Component Value Date   WBC 5.6 12/14/2019   PLT 197 12/14/2019   Lab Results  Component Value Date   INR 1.2 12/08/2019   Lab Results  Component Value Date   NA 139 12/14/2019   K 3.7 12/14/2019   CL 106 12/14/2019   CO2 24 12/14/2019   BUN 20 12/14/2019   CREATININE 1.40 (H) 12/14/2019   GLUCOSE 146 (H) 12/14/2019    Discharge Medications:   Allergies as of 12/16/2019      Reactions   Ace Inhibitors Cough   Lipitor [atorvastatin] Swelling   Simvastatin Other (See Comments)   Myalgias    Latex Itching      Medication List    STOP taking these medications   acetaminophen 500 MG  tablet Commonly known as: TYLENOL   enoxaparin 80 MG/0.8ML injection Commonly known as: LOVENOX   traMADol 50 MG tablet Commonly known as: ULTRAM     TAKE these medications   apixaban 5 MG Tabs tablet Commonly known as: ELIQUIS Take 1 tablet (5 mg total) by mouth every 12 (twelve) hours.   azelastine 0.1 % nasal spray Commonly known as: ASTELIN Place 1 spray into both nostrils 2 (two) times daily as needed for rhinitis. Use in each nostril as directed   buPROPion 150 MG 24 hr tablet Commonly known as: WELLBUTRIN XL Take 150 mg by mouth daily.   busPIRone 15 MG tablet Commonly known as: BUSPAR Take 15 mg by mouth 2 (two) times daily.   Calcium Carbonate-Vitamin D 600-400 MG-UNIT tablet TAKE 1 TABLET BY MOUTH EVERY DAY   carvedilol 12.5 MG tablet Commonly known as: COREG TAKE 1 TABLET BY MOUTH 2 TIMES DAILY WITH MEALS What changed: See the new instructions.   diclofenac sodium 1 % Gel Commonly known as: VOLTAREN Apply 2 g topically 4 (four) times daily. What changed:   when to take this  reasons to take this   DOK 100 MG capsule Generic drug: docusate sodium TAKE 1 CAPSULE BY MOUTH 2 TIMES DAILY What changed: See the new instructions.   donepezil 10 MG tablet Commonly known as: ARICEPT Take 1 tablet (10 mg total) by mouth at bedtime. For memory loss   Florastor 250 MG capsule Generic drug: saccharomyces boulardii TAKE 1 CAPSULE BY MOUTH 2 TIMES DAILY What changed: See the new instructions.   furosemide 40 MG tablet Commonly known as: LASIX Take 40 mg by mouth daily.   GNP Essential One Daily Tabs TAKE 1 TABLET BY MOUTH EVERY DAY   hydrALAZINE 25 MG tablet Commonly known as: APRESOLINE TAKE 1 TABLET BY MOUTH 3 TIMES DAILY   LORazepam 1 MG tablet Commonly known as: ATIVAN Take 1 tablet 30 minutes prior to leaving house on day of procedure.  Bring second tablet to office on day of procedure.   losartan 50 MG tablet Commonly known as: COZAAR TAKE 1  TABLET BY MOUTH EVERY DAY   Menthol (Topical Analgesic) 4 % Gel Commonly known as: Biofreeze Apply 3 oz topically 3 (three) times daily. For knee pain What changed:   how much to take  when to take this  reasons to take this  additional instructions   methocarbamol 500 MG tablet Commonly known as: Robaxin Take 1 tablet (500 mg total) by mouth 2 (two) times daily as needed.   ondansetron 4 MG tablet Commonly known as: Zofran Take 1 tablet (4 mg total) by mouth  every 8 (eight) hours as needed for nausea or vomiting.   oxyCODONE-acetaminophen 5-325 MG tablet Commonly known as: Percocet Take 1-2 tablets by mouth every 6 (six) hours as needed for severe pain.   pantoprazole 40 MG tablet Commonly known as: PROTONIX TAKE 1 TABLET BY MOUTH EVERY DAY   pravastatin 40 MG tablet Commonly known as: PRAVACHOL TAKE 1 TABLET BY MOUTH EVERY DAY   sertraline 100 MG tablet Commonly known as: ZOLOFT Take 100 mg by mouth daily.            Durable Medical Equipment  (From admission, onward)         Start     Ordered   12/13/19 1510  DME Walker rolling  Once       Question:  Patient needs a walker to treat with the following condition  Answer:  Total knee replacement status   12/13/19 1509   12/13/19 1510  DME 3 n 1  Once        12/13/19 1509   12/13/19 1510  DME Bedside commode  Once       Question:  Patient needs a bedside commode to treat with the following condition  Answer:  Total knee replacement status   12/13/19 1509          Diagnostic Studies: DG Chest 2 View  Result Date: 12/08/2019 CLINICAL DATA:  Left total knee replacement preadmit. EXAM: CHEST - 2 VIEW COMPARISON:  12/29/2017. FINDINGS: Enlargement of the cardiac silhouette. Tortuous aorta with calcific atherosclerosis. Both lungs are clear. No visible pleural effusions or pneumothorax. No acute osseous abnormality. Polyarticular degenerative change. IMPRESSION: 1. No acute cardiopulmonary disease. 2.  Cardiomegaly. Electronically Signed   By: Margaretha Sheffield MD   On: 12/08/2019 17:02   DG Knee Left Port  Result Date: 12/13/2019 CLINICAL DATA:  Knee replacement EXAM: PORTABLE LEFT KNEE - 1-2 VIEW COMPARISON:  06/29/2019 FINDINGS: Changes of left knee replacement. Soft tissue and joint space gas noted. No hardware complicating feature. IMPRESSION: Left knee replacement.  No visible complicating feature. Electronically Signed   By: Rolm Baptise M.D.   On: 12/13/2019 14:07    Disposition: Discharge disposition: 03-Skilled Nursing Facility       Discharge Instructions    Call MD / Call 911   Complete by: As directed    If you experience chest pain or shortness of breath, CALL 911 and be transported to the hospital emergency room.  If you develope a fever above 101.5 F, pus (white drainage) or increased drainage or redness at the wound, or calf pain, call your surgeon's office.   Constipation Prevention   Complete by: As directed    Drink plenty of fluids.  Prune juice may be helpful.  You may use a stool softener, such as Colace (over the counter) 100 mg twice a day.  Use MiraLax (over the counter) for constipation as needed.   Driving restrictions   Complete by: As directed    No driving while taking narcotic pain meds.   Increase activity slowly as tolerated   Complete by: As directed        Follow-up Information    Leandrew Koyanagi, MD. Schedule an appointment as soon as possible for a visit in 2 weeks.   Specialty: Orthopedic Surgery Contact information: [address] [place] [address] [phone]                Signed: Eduard Roux 12/16/2019, 8:00 AM

## 2019-12-16 NOTE — Progress Notes (Signed)
Report called to Lolita Rieger, nurse at [facility] in [facility], called [facility] to get an update on the status of her being picked up, she is still 4th on the list to be picked up, daughter Sharee Pimple at bedside made both her & the pt aware.

## 2019-12-16 NOTE — TOC Transition Note (Addendum)
Transition of Care [facility]) - CM/SW Discharge Note   Patient Details  Name: Susan [name] MRN: [id number] Date of Birth: January 22, 1940  Transition of Care [facility]) CM/SW Contact:  Curlene Labrum, RN Phone Number: 12/16/2019, 11:55 AM   Clinical Narrative:    Case management spoke with [facility] this morning and they are able to admit the patient today.  I spoke with Sharee Pimple, daughter, and Geraldo Pitter, daughter on the phone regarding the patient's transfer to the facility today.  The daughter, Geraldo Pitter, is requesting an update from Dr. Erlinda Hong prior to the patient's discharge today.  A message was left with Dr. Erlinda Hong and Gaylyn Rong, PA to reach out to the family.  Lexi, RN can call report to [facility] facility at [phone] for Room 209.  Discharge clinicals were placed in the hub for the facility.  I will arrange [facility] transport to the facility after the daughter is able to speak to the physician.  12/16/2019- 1400 - Dr. Erlinda Hong spoke with the daughter, Geraldo Pitter on the phone.  I called and scheduled the patient for [facility] and the patient and daughter are both aware of patient's transfer to [facility] via [facility].   Final next level of care: Skilled Nursing Facility Barriers to Discharge: No Barriers Identified,Continued Medical Work up   Patient Goals and CMS Choice Patient states their goals for this hospitalization and ongoing recovery are:: Patient plans to transfer to a SNF - lives at home alone. CMS Medicare.gov Compare Post Acute Care list provided to:: Patient Choice offered to / list presented to : Patient  Discharge Placement                       Discharge Plan and Services   Discharge Planning Services: CM Consult Post Acute Care Choice: [place]                               Social Determinants of Health (SDOH) Interventions     Readmission Risk Interventions No flowsheet data found.

## 2019-12-16 NOTE — Progress Notes (Signed)
PT Cancellation Note  Patient Details Name: JOLEENE BURNHAM MRN: [id number] DOB: 1940/05/10   Cancelled Treatment:    Reason Eval/Treat Not Completed: Other (comment). Pt preparing to d/c to SNF. Will defer treatment.  Mabeline Caras, PT, DPT Acute Rehabilitation Services  Pager [phone] Office [place] 12/16/2019, 1:30 PM

## 2019-12-16 NOTE — Progress Notes (Signed)
Patient stable.  No events. Plan for d/c to SNF today.

## 2019-12-16 NOTE — Plan of Care (Signed)
  Problem: Education: Goal: Knowledge of General Education information will improve Description: Including pain rating scale, medication(s)/side effects and non-pharmacologic comfort measures Outcome: Adequate for Discharge   Problem: Health Behavior/Discharge Planning: Goal: Ability to manage health-related needs will improve Outcome: Adequate for Discharge   Problem: Clinical Measurements: Goal: Ability to maintain clinical measurements within normal limits will improve Outcome: Adequate for Discharge Goal: Will remain free from infection Outcome: Adequate for Discharge Goal: Diagnostic test results will improve Outcome: Adequate for Discharge Goal: Respiratory complications will improve Outcome: Adequate for Discharge Goal: Cardiovascular complication will be avoided Outcome: Adequate for Discharge   Problem: Activity: Goal: Risk for activity intolerance will decrease Outcome: Adequate for Discharge   Problem: Nutrition: Goal: Adequate nutrition will be maintained Outcome: Adequate for Discharge   Problem: Coping: Goal: Level of anxiety will decrease Outcome: Adequate for Discharge   Problem: Elimination: Goal: Will not experience complications related to bowel motility Outcome: Adequate for Discharge Goal: Will not experience complications related to urinary retention Outcome: Adequate for Discharge   Problem: Pain Managment: Goal: General experience of comfort will improve Outcome: Adequate for Discharge   Problem: Safety: Goal: Ability to remain free from injury will improve Outcome: Adequate for Discharge   Problem: Skin Integrity: Goal: Risk for impaired skin integrity will decrease Outcome: Adequate for Discharge   Problem: Acute Rehab PT Goals(only PT should resolve) Goal: Pt Will Go Supine/Side To Sit Outcome: Adequate for Discharge Goal: Pt Will Go Sit To Supine/Side Outcome: Adequate for Discharge Goal: Patient Will Transfer Sit To/From  Stand Outcome: Adequate for Discharge Goal: Pt Will Ambulate Outcome: Adequate for Discharge   

## 2019-12-17 ENCOUNTER — Encounter: Payer: Self-pay | Admitting: Orthopaedic Surgery

## 2019-12-17 ENCOUNTER — Telehealth: Payer: Self-pay

## 2019-12-17 NOTE — Telephone Encounter (Signed)
Patient called she wants to know if Dr.Xu can prescribe her something stronger than percocet. HW:[phone]

## 2019-12-17 NOTE — Telephone Encounter (Signed)
Please advise 

## 2019-12-20 ENCOUNTER — Other Ambulatory Visit: Payer: Self-pay | Admitting: Physician Assistant

## 2019-12-20 MED ORDER — OXYCODONE-ACETAMINOPHEN 7.5-325 MG PO TABS
ORAL_TABLET | ORAL | 0 refills | Status: DC
Start: 2019-12-20 — End: 2021-07-09

## 2019-12-20 NOTE — Telephone Encounter (Signed)
No, last thing was 12/14/19 from you

## 2019-12-20 NOTE — Telephone Encounter (Signed)
Ok, I sent in oxy 7.5

## 2019-12-20 NOTE — Telephone Encounter (Signed)
Do you know if anything else was sent in?  I was out last wed-fri

## 2019-12-28 ENCOUNTER — Other Ambulatory Visit: Payer: Self-pay

## 2019-12-28 ENCOUNTER — Ambulatory Visit (INDEPENDENT_AMBULATORY_CARE_PROVIDER_SITE_OTHER): Payer: Medicare Other | Admitting: Physician Assistant

## 2019-12-28 ENCOUNTER — Telehealth: Payer: Self-pay | Admitting: Orthopaedic Surgery

## 2019-12-28 ENCOUNTER — Encounter: Payer: Self-pay | Admitting: Orthopaedic Surgery

## 2019-12-28 DIAGNOSIS — Z96652 Presence of left artificial knee joint: Secondary | ICD-10-CM

## 2019-12-28 NOTE — Telephone Encounter (Signed)
Ok to let water trickle over but do not submerge in body of water

## 2019-12-28 NOTE — Progress Notes (Signed)
Post-Op Visit Note   Patient: Susan [name]           Date of Birth: July 10, 1940           MRN: [id number] Visit Date: 12/28/2019 PCP: Sonia Side., FNP   Assessment & Plan:  Chief Complaint: No chief complaint on file.  Visit Diagnoses:  1. Hx of total knee replacement, left     Plan: Patient is a pleasant 79 year old female who comes in today 2 weeks out left lateral knee replacement.  She has been doing well.  She has been living at a skilled nursing facility where she is ambulating with a walker.  Her daughter notes that she was ambulating with a walker prior to her surgery.  She is having some pain which is relieved with medication.  Examination of the left knee shows a fully healed surgical scar with nylon sutures in place.  No evidence of infection or cellulitis.  Calf is soft nontender.  She is neurovascular intact distally.  At this point, sutures were removed and Steri-Strips applied.  She will follow up with [place] in 4 weeks time for repeat evaluation and 2 view x-rays of the left knee.  Follow-Up Instructions: Return in about 4 weeks (around 01/25/2020).   Orders:  No orders of the defined types were placed in this encounter.  No orders of the defined types were placed in this encounter.   Imaging: No new imaging  PMFS History: Patient Active Problem List   Diagnosis Date Noted  . 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  . 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 Anemia of chronic disease 09/07/2018  . Primary osteoarthritis of left knee 06/16/2018  . Venous stasis dermatitis of left lower extremity 04/29/2018  . Dementia ([place]) 03/12/2018  . Recurrent cellulitis 02/12/2018  . HTN (hypertension) 02/12/2018  . Cellulitis and abscess of leg 12/29/2017  . CKD (chronic kidney disease) stage 3, GFR 30-59 ml/min (HCC) 12/29/2017  . Cellulitis of left lower extremity   . Ankle edema   . Sepsis ([place]) 12/21/2017  . Medication  management 10/11/2016  . Hyperlipemia 06/18/2016  . Gait abnormality 06/18/2016  . Major depressive disorder, recurrent episode, severe ([place]) 11/21/2015  . Edema 02/09/2015  . History of CVA (cerebrovascular accident) 02/01/2015  . 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 ([place])   . 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 Chronic low back pain 01/28/2015  . Short-term memory loss 01/28/2015  . Volume depletion 01/28/2015  . Persistent atrial fibrillation 01/26/2015  . DCM (dilated cardiomyopathy) ([place]) 01/26/2015  . TIA (transient ischemic attack) 01/24/2015  . Chronic diastolic (congestive) heart failure ([place])   . Obesity (BMI 30-39.9) 01/27/2013  . Constipation 09/02/2012  . H/O arthroscopy of right knee 08/06/2012  . Anxiety   . Depression   . GERD (gastroesophageal reflux disease)   . Hypertensive heart disease   . OSA (obstructive sleep apnea)   . Osteoarthritis of right knee   . Hyperparathyroidism, primary ([place]) 04/22/2012  . Chronic cough 10/02/2011   Past Medical History:  Diagnosis Date  . Anxiety   . Arthritis    "legs, back" (07/29/2012)  . Cellulitis and abscess of leg 12/29/2017  . Chronic diastolic (congestive) heart failure ([place])   . Chronic lower back pain   . Complication of anesthesia    "I have apnea" (07/29/2012)  . Dementia ([place]) 03/12/2018  . 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  Family history of anesthesia complication    "some wake up during OR; some are hard to wake up; some both" (07/29/2012)  . GERD (gastroesophageal reflux disease)   . 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 ([place])   . History of CVA (cerebrovascular accident) 02/01/2015  . History of stomach ulcers 1980's  . Hyperlipidemia   . Hyperparathyroidism, primary ([place]) 04/22/2012  . Hypertensive heart disease   . Incontinent of urine    wears pads  . Major depressive disorder, recurrent episode, severe ([place]) 11/21/2015   . OSA on CPAP   . Osteoarthritis of right knee   . Pedal edema   . Persistent atrial fibrillation (HCC)    CHADS2VASC score is 7 - on chronic anticoagulation with Apixaban  . Spinal stenosis   . Umbilical hernia    "unrepaired" (07/29/2012)  . Varicose veins    "BLE" (07/29/2012)  . Vascular dementia ([place])   . Venous stasis dermatitis of left lower extremity 04/29/2018    Family History  Problem Relation Age of Onset  . Hypertension Mother        deceased  . Stroke Mother   . Breast cancer Mother   . Lung cancer Father        deceased  . Diabetes Daughter   . Hypertension Daughter     Past Surgical History:  Procedure Laterality Date  . CATARACT EXTRACTION    . COLONOSCOPY    . ENDOVENOUS ABLATION SAPHENOUS VEIN W/ LASER Left 06/24/2019   endovenous laser ablation left greater saphenous vein by Gae Gallop MD   . IUD REMOVAL  [phone]  . PARATHYROIDECTOMY N/A 06/16/2012   Procedure: NECK EXPLORATION AND LEFT SUPERIOR PARATHYROIDECTOMY;  Surgeon: Earnstine Regal, MD;  Location: [facility];  Service: General;  Laterality: N/A;  . 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 TOTAL KNEE ARTHROPLASTY Right 07/29/2012   Procedure: TOTAL KNEE ARTHROPLASTY;  Surgeon: Ninetta Lights, MD;  Location: [facility];  Service: Orthopedics;  Laterality: Right;  . TOTAL KNEE ARTHROPLASTY Left 12/13/2019   Procedure: LEFT TOTAL KNEE ARTHROPLASTY;  Surgeon: Leandrew Koyanagi, MD;  Location: [facility];  Service: Orthopedics;  Laterality: Left;   Social History   Occupational History  . Occupation: retired    Fish farm manager: RETIRED  Tobacco Use  . Smoking status: Former Smoker    Packs/day: 1.00    Years: 30.00    Pack years: 30.00    Types: Cigarettes    Quit date: 01/07/1978    Years since quitting: 42.0  . Smokeless tobacco: Never Used  Vaping Use  . Vaping Use: Never used  Substance and Sexual Activity  . Alcohol use: No  . Drug use: No  . Sexual activity: Not Currently    Comment: intercourse age 58,less  than 5 secxual partners,

## 2019-12-28 NOTE — Telephone Encounter (Signed)
Pt would like to know if she can take a shower now. Please call her.

## 2019-12-28 NOTE — Telephone Encounter (Signed)
Pls advise.  

## 2019-12-29 ENCOUNTER — Telehealth: Payer: Self-pay | Admitting: Orthopaedic Surgery

## 2019-12-29 NOTE — Telephone Encounter (Signed)
Forwarded to me by mistake.

## 2019-12-29 NOTE — Telephone Encounter (Signed)
I called pt to advise of message below. She wants to know when she laves the SNF who will help her use it at home. I advised that she should speak with PT at the facility  and find out what her goals are and what the anticipated length of stay will be at the facility she is afraid she will not be able to care for herself upon discharge. I advised that they will not release her home until she has met her goals and then will arrange for additional outside home health is she needed additional help.

## 2019-12-29 NOTE — Telephone Encounter (Signed)
I called and lm on vm to advise this pt to continue with CPM she wants to know how long per day if she is to use it every day and when she can stop using it. Please advise.

## 2019-12-29 NOTE — Telephone Encounter (Signed)
I called and lm on vm to advise of message below.  

## 2019-12-29 NOTE — Telephone Encounter (Signed)
4-6 hours per day and as long as she is able to use it, especially while at [facility]

## 2019-12-29 NOTE — Telephone Encounter (Signed)
Pt called and was wondering how long is she suppose to use the leg brace everyday. She also wants know if she can stop using it.  Please call her at [phone]

## 2019-12-29 NOTE — Telephone Encounter (Signed)
Best to use

## 2019-12-29 NOTE — Telephone Encounter (Signed)
I called pt to advise ok to take shower. She states that she does not like the CPM machine and wants to know if she has to use it. Pt was in office yesterday please advise.

## 2020-01-06 ENCOUNTER — Telehealth: Payer: Medicare Other | Admitting: Cardiology

## 2020-01-12 ENCOUNTER — Telehealth: Payer: Self-pay | Admitting: Orthopaedic Surgery

## 2020-01-12 NOTE — Telephone Encounter (Signed)
Patient called requesting a call back from Dr. Roda Shutters or nurse. Pt states she has medical questions. Please call patient at [phone].

## 2020-01-12 NOTE — Telephone Encounter (Signed)
I called pt and she wanted her appt day and time. This was given 01/25/20 at 9am and she also wants to know how she gets the CPM returned. Advised I will get clarification and this and call her back.

## 2020-01-12 NOTE — Telephone Encounter (Signed)
IC advised that typically it will be picked up from vendor that delivered it once it is no longer covered by her insurance.

## 2020-01-25 ENCOUNTER — Ambulatory Visit: Payer: Medicare Other | Admitting: Orthopaedic Surgery

## 2020-02-01 ENCOUNTER — Ambulatory Visit (INDEPENDENT_AMBULATORY_CARE_PROVIDER_SITE_OTHER): Payer: Medicare Other | Admitting: Orthopaedic Surgery

## 2020-02-01 ENCOUNTER — Ambulatory Visit (INDEPENDENT_AMBULATORY_CARE_PROVIDER_SITE_OTHER): Payer: Medicare Other

## 2020-02-01 ENCOUNTER — Encounter: Payer: Self-pay | Admitting: Orthopaedic Surgery

## 2020-02-01 ENCOUNTER — Other Ambulatory Visit: Payer: Self-pay

## 2020-02-01 DIAGNOSIS — Z96652 Presence of left artificial knee joint: Secondary | ICD-10-CM | POA: Insufficient documentation

## 2020-02-01 NOTE — Progress Notes (Signed)
Post-Op Visit Note   Patient: Susan [name]           Date of Birth: 1940/02/12           MRN: [id number] Visit Date: 02/01/2020 PCP: Sonia Side., FNP   Assessment & Plan:  Chief Complaint:  Chief Complaint  Patient presents with  . Left Knee - Pain   Visit Diagnoses:  1. Hx of total knee replacement, left     Plan:   [name] is 7 weeks status post left total knee replacement.  She has 2 more remaining sessions of home health PT.  Overall she is reporting good recovery and pain relief.  She is very happy overall.  Takes Eliquis at baseline.  Examination of the left knee shows fully healed surgical scar.  Range of motion is 5 to 110 degrees.  Collaterals are stable.  X-rays demonstrate stable total knee replacement without any complications.  No calf tenderness.  Minimal swelling.  Mild discomfort with range of motion.  [name] is doing very well from her knee replacement surgery.  I will make a referral to outpatient physical therapy at our office.  Dental prophylaxis reinforced.  Recheck in 6 weeks with two-view x-rays of the left knee.  Follow-Up Instructions: Return in about 6 weeks (around 03/14/2020).   Orders:  Orders Placed This Encounter  Procedures  . XR Knee 1-2 Views Left  . Ambulatory referral to Physical Therapy   No orders of the defined types were placed in this encounter.   Imaging: XR Knee 1-2 Views Left  Result Date: 02/01/2020 Stable total knee replacement in good alignment.    PMFS History: Patient Active Problem List   Diagnosis Date Noted  . 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  . 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 Anemia of chronic disease 09/07/2018  . Primary osteoarthritis of left knee 06/16/2018  . Venous stasis dermatitis of left lower extremity 04/29/2018  . Dementia ([place]) 03/12/2018  . Recurrent cellulitis 02/12/2018  . HTN (hypertension) 02/12/2018  . Cellulitis and abscess of  leg 12/29/2017  . CKD (chronic kidney disease) stage 3, GFR 30-59 ml/min (HCC) 12/29/2017  . Cellulitis of left lower extremity   . Ankle edema   . Sepsis ([place]) 12/21/2017  . Medication management 10/11/2016  . Hyperlipemia 06/18/2016  . Gait abnormality 06/18/2016  . Major depressive disorder, recurrent episode, severe ([place]) 11/21/2015  . Edema 02/09/2015  . History of CVA (cerebrovascular accident) 02/01/2015  . 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 ([place])   . 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 Chronic low back pain 01/28/2015  . Short-term memory loss 01/28/2015  . Volume depletion 01/28/2015  . Persistent atrial fibrillation 01/26/2015  . DCM (dilated cardiomyopathy) ([place]) 01/26/2015  . TIA (transient ischemic attack) 01/24/2015  . Chronic diastolic (congestive) heart failure ([place])   . Obesity (BMI 30-39.9) 01/27/2013  . Constipation 09/02/2012  . H/O arthroscopy of right knee 08/06/2012  . Anxiety   . Depression   . GERD (gastroesophageal reflux disease)   . Hypertensive heart disease   . OSA (obstructive sleep apnea)   . Osteoarthritis of right knee   . Hyperparathyroidism, primary ([place]) 04/22/2012  . Chronic cough 10/02/2011   Past Medical History:  Diagnosis Date  . Anxiety   . Arthritis    "legs, back" (07/29/2012)  . Cellulitis and abscess of leg 12/29/2017  . Chronic diastolic (congestive) heart failure (  HCC)   . Chronic lower back pain   . Complication of anesthesia    "I have apnea" (07/29/2012)  . Dementia ([place]) 03/12/2018  . 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 Family history of anesthesia complication    "some wake up during OR; some are hard to wake up; some both" (07/29/2012)  . GERD (gastroesophageal reflux disease)   . 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 ([place])   . History of CVA (cerebrovascular accident) 02/01/2015  . History of stomach ulcers 1980's  . Hyperlipidemia   .  Hyperparathyroidism, primary ([place]) 04/22/2012  . Hypertensive heart disease   . Incontinent of urine    wears pads  . Major depressive disorder, recurrent episode, severe ([place]) 11/21/2015  . OSA on CPAP   . Osteoarthritis of right knee   . Pedal edema   . Persistent atrial fibrillation (HCC)    CHADS2VASC score is 7 - on chronic anticoagulation with Apixaban  . Spinal stenosis   . Umbilical hernia    "unrepaired" (07/29/2012)  . Varicose veins    "BLE" (07/29/2012)  . Vascular dementia ([place])   . Venous stasis dermatitis of left lower extremity 04/29/2018    Family History  Problem Relation Age of Onset  . Hypertension Mother        deceased  . Stroke Mother   . Breast cancer Mother   . Lung cancer Father        deceased  . Diabetes Daughter   . Hypertension Daughter     Past Surgical History:  Procedure Laterality Date  . CATARACT EXTRACTION    . COLONOSCOPY    . ENDOVENOUS ABLATION SAPHENOUS VEIN W/ LASER Left 06/24/2019   endovenous laser ablation left greater saphenous vein by Gae Gallop MD   . IUD REMOVAL  [phone]  . PARATHYROIDECTOMY N/A 06/16/2012   Procedure: NECK EXPLORATION AND LEFT SUPERIOR PARATHYROIDECTOMY;  Surgeon: Earnstine Regal, MD;  Location: [facility];  Service: General;  Laterality: N/A;  . 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 TOTAL KNEE ARTHROPLASTY Right 07/29/2012   Procedure: TOTAL KNEE ARTHROPLASTY;  Surgeon: Ninetta Lights, MD;  Location: [facility];  Service: Orthopedics;  Laterality: Right;  . TOTAL KNEE ARTHROPLASTY Left 12/13/2019   Procedure: LEFT TOTAL KNEE ARTHROPLASTY;  Surgeon: Leandrew Koyanagi, MD;  Location: [facility];  Service: Orthopedics;  Laterality: Left;   Social History   Occupational History  . Occupation: retired    Fish farm manager: RETIRED  Tobacco Use  . Smoking status: Former Smoker    Packs/day: 1.00    Years: 30.00    Pack years: 30.00    Types: Cigarettes    Quit date: 01/07/1978    Years since quitting: 42.0  . Smokeless tobacco:  Never Used  Vaping Use  . Vaping Use: Never used  Substance and Sexual Activity  . Alcohol use: No  . Drug use: No  . Sexual activity: Not Currently    Comment: intercourse age 38,less than 5 secxual partners,

## 2020-02-02 ENCOUNTER — Telehealth: Payer: Self-pay | Admitting: Orthopaedic Surgery

## 2020-02-02 NOTE — Telephone Encounter (Signed)
Pt called wanting to know if she should still be using her CPM machine and if not how should she go about returning it?  [phone]

## 2020-02-02 NOTE — Telephone Encounter (Signed)
Yes keep using it until the company takes it

## 2020-02-02 NOTE — Telephone Encounter (Signed)
Can you help with this please.

## 2020-02-02 NOTE — Telephone Encounter (Signed)
Please advise 

## 2020-02-03 NOTE — Telephone Encounter (Signed)
Spoke with Ruby Cola and he will follow-up with the patient.

## 2020-02-08 ENCOUNTER — Ambulatory Visit: Payer: Medicare Other | Admitting: Physical Therapy

## 2020-02-10 ENCOUNTER — Ambulatory Visit (INDEPENDENT_AMBULATORY_CARE_PROVIDER_SITE_OTHER): Payer: Medicare Other | Admitting: Physical Therapy

## 2020-02-10 ENCOUNTER — Other Ambulatory Visit: Payer: Self-pay

## 2020-02-10 ENCOUNTER — Encounter: Payer: Self-pay | Admitting: Physical Therapy

## 2020-02-10 DIAGNOSIS — M25562 Pain in left knee: Secondary | ICD-10-CM

## 2020-02-10 DIAGNOSIS — R600 Localized edema: Secondary | ICD-10-CM | POA: Diagnosis not present

## 2020-02-10 DIAGNOSIS — M25662 Stiffness of left knee, not elsewhere classified: Secondary | ICD-10-CM

## 2020-02-10 DIAGNOSIS — R2689 Other abnormalities of gait and mobility: Secondary | ICD-10-CM | POA: Diagnosis not present

## 2020-02-10 DIAGNOSIS — M6281 Muscle weakness (generalized): Secondary | ICD-10-CM | POA: Diagnosis not present

## 2020-02-10 NOTE — Patient Instructions (Signed)
Access Code: [email or web address] URL: [email or web address] Date: 02/10/2020 Prepared by: Jamey Reas  Exercises Quad Setting and Stretching - 2-4 x daily - 7 x weekly - 5-10 sets - 10 reps - prop 5-10 minutes & quad set5 seconds hold Supine Heel Slide with Strap - 2-3 x daily - 7 x weekly - 2-3 sets - 10 reps - 5 seconds hold Supine Straight Leg Raises - 2-3 x daily - 7 x weekly - 2-3 sets - 10 reps - 5 seconds hold Seated Knee Flexion Extension AROM - 2-4 x daily - 7 x weekly - 2-3 sets - 10 reps - 5 seconds hold Seated Hamstring Stretch with Strap - 2-4 x daily - 7 x weekly - 1 sets - 3 reps - 20-30 seconds hold Standing Gastroc Stretch - 2-4 x daily - 7 x weekly - 1 sets - 3 reps - 20-30 seconds hold

## 2020-02-10 NOTE — Therapy (Signed)
[facility] [place], [place], [address] Phone: [phone]   Fax:  [phone]  Physical Therapy Treatment  Patient Details  Name: ACIE VENABLES MRN: [id number] Date of Birth: 03/07/1940 Referring Provider (PT): Leandrew Koyanagi, MD   Encounter Date: 02/10/2020   PT End of Session - 02/10/20 1315    Visit Number 1    Number of Visits 13    Date for PT Re-Evaluation 03/24/20    Authorization Type U[employer]    Authorization Time Period $30 copay    Progress Note Due on Visit 10    PT Start Time Bee During Treatment Gait belt    Activity Tolerance Patient tolerated treatment well;Patient limited by pain    Behavior During Therapy [facility] for tasks assessed/performed           Past Medical History:  Diagnosis Date  . Anxiety   . Arthritis    "legs, back" (07/29/2012)  . Cellulitis and abscess of leg 12/29/2017  . Chronic diastolic (congestive) heart failure ([place])   . Chronic lower back pain   . Complication of anesthesia    "I have apnea" (07/29/2012)  . Dementia ([place]) 03/12/2018  . 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 Family history of anesthesia complication    "some wake up during OR; some are hard to wake up; some both" (07/29/2012)  . GERD (gastroesophageal reflux disease)   . 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 ([place])   . History of CVA (cerebrovascular accident) 02/01/2015  . History of stomach ulcers 1980's  . Hyperlipidemia   . Hyperparathyroidism, primary ([place]) 04/22/2012  . Hypertensive heart disease   . Incontinent of urine    wears pads  . Major depressive disorder, recurrent episode, severe ([place]) 11/21/2015  . OSA on CPAP   . Osteoarthritis of right knee   . Pedal edema   . Persistent atrial fibrillation (HCC)    CHADS2VASC score is 7 - on chronic anticoagulation with Apixaban  . Spinal stenosis   . Umbilical hernia    "unrepaired" (07/29/2012)  . Varicose  veins    "BLE" (07/29/2012)  . Vascular dementia ([place])   . Venous stasis dermatitis of left lower extremity 04/29/2018    Past Surgical History:  Procedure Laterality Date  . CATARACT EXTRACTION    . COLONOSCOPY    . ENDOVENOUS ABLATION SAPHENOUS VEIN W/ LASER Left 06/24/2019   endovenous laser ablation left greater saphenous vein by Gae Gallop MD   . IUD REMOVAL  [phone]  . PARATHYROIDECTOMY N/A 06/16/2012   Procedure: NECK EXPLORATION AND LEFT SUPERIOR PARATHYROIDECTOMY;  Surgeon: Earnstine Regal, MD;  Location: [facility];  Service: General;  Laterality: N/A;  . 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 TOTAL KNEE ARTHROPLASTY Right 07/29/2012   Procedure: TOTAL KNEE ARTHROPLASTY;  Surgeon: Ninetta Lights, MD;  Location: [facility];  Service: Orthopedics;  Laterality: Right;  . TOTAL KNEE ARTHROPLASTY Left 12/13/2019   Procedure: LEFT TOTAL KNEE ARTHROPLASTY;  Surgeon: Leandrew Koyanagi, MD;  Location: [facility];  Service: Orthopedics;  Laterality: Left;    There were no vitals filed for this visit.   Subjective Assessment - 02/10/20 1024    Subjective This 80yo female was referred to PT by Leandrew Koyanagi, MD with [phone] (ICD-10-CM) - Hx of total knee replacement, left done on 12/13/2019 due to osteoarthritis. [place] for ~3 weeks and then HHPT thru 02/04/2020.  Pertinent History OA, CHF, vascular dementia, CVA, CAD, persistent A-Fib, spinal stenosis, right knee replacement ~93yrs ago    Patient Stated Goals to walk comfortably and get out chair.    Currently in Pain? Yes    Pain Score 5    In last week, worst 8/10 & best 2-3/10   Pain Location Knee    Pain Orientation Left    Pain Descriptors / Indicators Sharp    Pain Type Acute pain;Surgical pain    Pain Onset More than a month ago    Pain Frequency Intermittent    Aggravating Factors  standing or walking too much, sitting bending knee    Pain Relieving Factors meds ibuprofen or tylenol    Multiple Pain Sites Yes    Pain  Score 4   in last week, worst 4-5/10 & best 0/10   Pain Location Back    Pain Orientation Posterior;Mid    Pain Descriptors / Indicators Sore;Spasm    Pain Type Chronic pain    Pain Onset More than a month ago    Pain Frequency Intermittent    Aggravating Factors  standing    Pain Relieving Factors sit to rest              [facility] PT Assessment - 02/10/20 1015      Assessment   Medical Diagnosis left Total Knee Arthroplasty    Referring Provider (PT) Leandrew Koyanagi, MD    Onset Date/Surgical Date 12/13/19    Prior Therapy Rehab nsg home & HHPT      Precautions   Precautions Fall      Balance Screen   Has the patient fallen in the past 6 months No    Has the patient had a decrease in activity level because of a fear of falling?  Yes    Is the patient reluctant to leave their home because of a fear of falling?  No      Home Environment   Living Environment Private residence    Living Arrangements Alone    Type of [place]   2nd floor   [place] One level    [place] - 4 wheels;[place] - single point;Bedside commode;Shower seat      Prior Function   Level of Independence Independent;Independent with community mobility with device;Independent with household mobility without device   rollator for community   Vocation Retired    [employer], bingo (has to walk 100-150' to elevator & 100-150' to bingo room)      Observation/Other Assessments   Focus on Therapeutic Outcomes (FOTO)  [phone]   risk adjusted 38% functional, target goal 53%     Observation/Other Assessments-Edema    Edema Circumferential      Circumferential Edema   Circumferential - Right superior patella 45cm inferior patella 40cm    Circumferential - Left  superior patella 49cm inferior patella 41cm      ROM / Strength   AROM / PROM / Strength AROM;PROM;Strength      AROM   AROM Assessment Site Knee    Right/Left Knee Right;Left    Right Knee  Extension -10   seated LAQ   Right Knee Flexion 120   seated   Left Knee Extension -19   -19* seated LAQ,   Left Knee Flexion 92   92* seated     PROM   PROM Assessment Site Knee    Right/Left Knee Right;Left  Right Knee Extension -5    Right Knee Flexion 121    Left Knee Extension 17   seated -17* w/o hamstring & -19* w/ hamstring stretch   Left Knee Flexion 97   97* seated     Strength   Strength Assessment Site Knee    Right/Left Knee Right;Left    Right Knee Flexion 4/5    Right Knee Extension 4+/5    Left Knee Flexion 3-/5   pain limiting   Left Knee Extension 3-/5   pain limiting     Transfers   Transfers Sit to Stand;Stand to Sit    Sit to Stand 5: Supervision;With upper extremity assist;With armrests;From chair/3-in-1;Other (comment)   to RW for stability, significant increased time   Stand to Sit 5: Supervision;With upper extremity assist;With armrests;To chair/3-in-1;Other (comment)   RW for stability     Ambulation/Gait   Ambulation/Gait Yes    Ambulation/Gait Assistance 5: Supervision    Ambulation/Gait Assistance Details excessive UE weight bearing on rollator walker with only partial weight tolerated on LLE    Ambulation Distance (Feet) 100 Feet    Assistive device Rollator    Gait Pattern Step-to pattern;Decreased step length - right;Decreased step length - left;Decreased stance time - left;Decreased hip/knee flexion - left;Decreased weight shift to left;Left hip hike;Left flexed knee in stance;Antalgic;Lateral hip instability;Trunk flexed    Ambulation Surface Level;Indoor    Gait velocity 0.82 ft/sec                                   PT Short Term Goals - 02/10/20 1323      PT SHORT TERM GOAL #1   Title Patient demonstrates understanding of initial HEP.    Time 2    Period Weeks    Status New    Target Date 02/25/20             PT Long Term Goals - 02/10/20 1319      PT LONG TERM GOAL #1   Title Patient FOTO score  improves to 53%    Time 6    Period Weeks    Status New    Target Date 03/24/20      PT LONG TERM GOAL #2   Title Patient left knee PROM -5* extension to 100* flexion    Time 6    Period Weeks    Status New    Target Date 03/24/20      PT LONG TERM GOAL #3   Title Patient reports left knee pain at rest </= 2/10 and increases <2 increments with standing & gait activities.    Time 6    Period Weeks    Status New    Target Date 03/24/20      PT LONG TERM GOAL #4   Title Patient ambulates 300' with rollator walker & negotiates ramps / curbs modified independent.    Time 6    Period Weeks    Status New    Target Date 03/24/20                 Plan - 02/10/20 1324    Clinical Impression Statement This 80yo female underwent a left Total Knee Arthroplasty 7.5 weeks prior to PT evaluation. She has decreased left knee strength, AROM & PROM limiting her mobility. She has left knee pain & edema limiting knee function.  Her gait relies heavily on upper extremities on rollator  walker limiting weight on left leg.  Patient would benefit from skilled PT to improve functional mobility & safety.    Personal Factors and Comorbidities Age;Comorbidity 3+;Fitness    Comorbidities OA, CHF, vascular dementia, CVA, CAD, persistent A-Fib, spinal stenosis, right knee replacement ~48yrs ago    Examination-Activity Limitations Locomotion Level;Squat;Stairs;Stand;Transfers    Examination-Participation Restrictions Community Activity    Stability/Clinical Decision Making Stable/Uncomplicated    Clinical Decision Making Low    Rehab Potential Good    PT Frequency 2x / week    PT Duration 6 weeks    PT Treatment/Interventions ADLs/Self Care Home Management;Cryotherapy;Electrical Stimulation;Canalith Repostioning;DME Instruction;Gait training;Stair training;Functional mobility training;Therapeutic activities;Therapeutic exercise;Balance training;Neuromuscular re-education;Patient/family education;Manual  techniques;Scar mobilization;Passive range of motion;Taping;Joint Manipulations;Vasopneumatic Device    PT Next Visit Plan check HEP, manual therapy & exercise to increase range & strength    PT Home Exercise Plan Access Code: [email or web address]    Consulted and Agree with Plan of Care Patient           Patient will benefit from skilled therapeutic intervention in order to improve the following deficits and impairments:  Abnormal gait,Decreased activity tolerance,Decreased balance,Decreased knowledge of use of DME,Decreased mobility,Decreased range of motion,Decreased strength,Increased edema,Impaired flexibility,Postural dysfunction,Obesity  Visit Diagnosis: Muscle weakness (generalized)  Other abnormalities of gait and mobility  Stiffness of left knee, not elsewhere classified  Localized edema  Acute pain of left knee     Problem List Patient Active Problem List   Diagnosis Date Noted  . Hx of total knee replacement, left 02/01/2020  . 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  . 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 Anemia of chronic disease 09/07/2018  . Primary osteoarthritis of left knee 06/16/2018  . Venous stasis dermatitis of left lower extremity 04/29/2018  . Dementia ([place]) 03/12/2018  . Recurrent cellulitis 02/12/2018  . HTN (hypertension) 02/12/2018  . Cellulitis and abscess of leg 12/29/2017  . CKD (chronic kidney disease) stage 3, GFR 30-59 ml/min (HCC) 12/29/2017  . Cellulitis of left lower extremity   . Ankle edema   . Sepsis ([place]) 12/21/2017  . Medication management 10/11/2016  . Hyperlipemia 06/18/2016  . Gait abnormality 06/18/2016  . Major depressive disorder, recurrent episode, severe ([place]) 11/21/2015  . Edema 02/09/2015  . History of CVA (cerebrovascular accident) 02/01/2015  . 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 ([place])   . 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 Chronic low back  pain 01/28/2015  . Short-term memory loss 01/28/2015  . Volume depletion 01/28/2015  . Persistent atrial fibrillation 01/26/2015  . DCM (dilated cardiomyopathy) ([place]) 01/26/2015  . TIA (transient ischemic attack) 01/24/2015  . Chronic diastolic (congestive) heart failure ([place])   . Obesity (BMI 30-39.9) 01/27/2013  . Constipation 09/02/2012  . H/O arthroscopy of right knee 08/06/2012  . Anxiety   . Depression   . GERD (gastroesophageal reflux disease)   . Hypertensive heart disease   . OSA (obstructive sleep apnea)   . Osteoarthritis of right knee   . Hyperparathyroidism, primary ([place]) 04/22/2012  . Chronic cough 10/02/2011    Jamey Reas, PT, DPT 02/10/2020, 1:30 PM  [facility] [address] [place], [place], [address] Phone: [phone]   Fax:  [phone]  Name: JOMAIRA DARR MRN: [id number] Date of Birth: June 08, 1940

## 2020-02-15 ENCOUNTER — Ambulatory Visit (INDEPENDENT_AMBULATORY_CARE_PROVIDER_SITE_OTHER): Payer: Medicare Other | Admitting: Physical Therapy

## 2020-02-15 ENCOUNTER — Encounter: Payer: Self-pay | Admitting: Physical Therapy

## 2020-02-15 ENCOUNTER — Other Ambulatory Visit: Payer: Self-pay

## 2020-02-15 DIAGNOSIS — R600 Localized edema: Secondary | ICD-10-CM | POA: Diagnosis not present

## 2020-02-15 DIAGNOSIS — M25662 Stiffness of left knee, not elsewhere classified: Secondary | ICD-10-CM

## 2020-02-15 DIAGNOSIS — R2689 Other abnormalities of gait and mobility: Secondary | ICD-10-CM | POA: Diagnosis not present

## 2020-02-15 DIAGNOSIS — M6281 Muscle weakness (generalized): Secondary | ICD-10-CM

## 2020-02-15 DIAGNOSIS — M25562 Pain in left knee: Secondary | ICD-10-CM

## 2020-02-15 NOTE — Therapy (Signed)
[facility] Physical Therapy [address] [place], [place], [address] Phone: [phone]   Fax:  [phone]  Physical Therapy Treatment  Patient Details  Name: Susan [name] MRN: [id number] Date of Birth: 1940/02/17 Referring Provider (PT): Leandrew Koyanagi, MD   Encounter Date: 02/15/2020   PT End of Session - 02/15/20 1256    Visit Number 2    Number of Visits 13    Date for PT Re-Evaluation 03/24/20    Authorization Type UHC    Authorization Time Period $30 copay    Progress Note Due on Visit 10    PT Start Time 1300    PT Stop Time 1355    PT Time Calculation (min) 55 min    Equipment Utilized During Treatment Gait belt    Activity Tolerance Patient tolerated treatment well;Patient limited by pain    Behavior During Therapy [facility] for tasks assessed/performed           Past Medical History:  Diagnosis Date  . Anxiety   . Arthritis    "legs, back" (07/29/2012)  . Cellulitis and abscess of leg 12/29/2017  . Chronic diastolic (congestive) heart failure ([place])   . Chronic lower back pain   . Complication of anesthesia    "I have apnea" (07/29/2012)  . Dementia ([place]) 03/12/2018  . 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 Family history of anesthesia complication    "some wake up during OR; some are hard to wake up; some both" (07/29/2012)  . GERD (gastroesophageal reflux disease)   . 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 ([place])   . History of CVA (cerebrovascular accident) 02/01/2015  . History of stomach ulcers 1980's  . Hyperlipidemia   . Hyperparathyroidism, primary ([place]) 04/22/2012  . Hypertensive heart disease   . Incontinent of urine    wears pads  . Major depressive disorder, recurrent episode, severe ([place]) 11/21/2015  . OSA on CPAP   . Osteoarthritis of right knee   . Pedal edema   . Persistent atrial fibrillation (HCC)    CHADS2VASC score is 7 - on chronic anticoagulation with Apixaban  . Spinal stenosis   .  Umbilical hernia    "unrepaired" (07/29/2012)  . Varicose veins    "BLE" (07/29/2012)  . Vascular dementia ([place])   . Venous stasis dermatitis of left lower extremity 04/29/2018    Past Surgical History:  Procedure Laterality Date  . CATARACT EXTRACTION    . COLONOSCOPY    . ENDOVENOUS ABLATION SAPHENOUS VEIN W/ LASER Left 06/24/2019   endovenous laser ablation left greater saphenous vein by Gae Gallop MD   . IUD REMOVAL  [phone]  . PARATHYROIDECTOMY N/A 06/16/2012   Procedure: NECK EXPLORATION AND LEFT SUPERIOR PARATHYROIDECTOMY;  Surgeon: Earnstine Regal, MD;  Location: [facility];  Service: General;  Laterality: N/A;  . 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 TOTAL KNEE ARTHROPLASTY Right 07/29/2012   Procedure: TOTAL KNEE ARTHROPLASTY;  Surgeon: Ninetta Lights, MD;  Location: [place];  Service: Orthopedics;  Laterality: Right;  . TOTAL KNEE ARTHROPLASTY Left 12/13/2019   Procedure: LEFT TOTAL KNEE ARTHROPLASTY;  Surgeon: Leandrew Koyanagi, MD;  Location: [place];  Service: Orthopedics;  Laterality: Left;    There were no vitals filed for this visit.   Subjective Assessment - 02/15/20 1257    Subjective The exercises are going pretty good. She was surprised how well that she could do them.    Pertinent History OA, CHF, vascular dementia,  CVA, CAD, persistent A-Fib, spinal stenosis, right knee replacement ~45yrs ago    Patient Stated Goals to walk comfortably and get out chair.    Currently in Pain? Yes    Pain Score 5    Since last PT 8-9/10 worst & 2/10 best   Pain Location Knee    Pain Orientation Left;Anterior    Pain Descriptors / Indicators Sharp    Pain Type Chronic pain;Surgical pain    Pain Onset More than a month ago    Pain Frequency Intermittent    Aggravating Factors  sitting too long then get up    Pain Relieving Factors stretching & moving leg    Pain Onset More than a month ago                             [facility] Adult PT Treatment/Exercise - 02/15/20  1300      Transfers   Transfers Sit to Stand;Stand to Sit    Sit to Stand 5: Supervision;With upper extremity assist;With armrests;From chair/3-in-1;Other (comment)   to locked rollator walker   Stand to Sit 5: Supervision;With upper extremity assist;With armrests;To chair/3-in-1;Other (comment)   using locked rollator walker for stabilization     Ambulation/Gait   Ambulation/Gait Yes    Ambulation/Gait Assistance 5: Supervision    Ambulation/Gait Assistance Details demo, verbal & tactile cues on step through pattern with equal step length. Initiated in //bars using mirror & progressed to her rollator walker.    Ambulation Distance (Feet) 100 Feet    Assistive device Rollator;Parallel bars    Ambulation Surface Level;Indoor    Pre-Gait Activities in //bars with target lines on floor, worked on step through pattern & equal step length with visual / mirror, demo, tactile & verbal cues.      Exercises   Exercises Knee/Hip      Knee/Hip Exercises: Aerobic   Nustep seat 9 Level 5 with BLEs & BUEs 8 minutes.      Knee/Hip Exercises: Standing   Heel Raises Both;1 set;10 reps;5 seconds   heel raises & toe raises   Heel Raises Limitations verbal cues on technique    Terminal Knee Extension Left;Strengthening;2 sets;10 reps;Theraband    Theraband Level (Terminal Knee Extension) Level 2 (Red)    Terminal Knee Extension Limitations tactile & verbal cues on technique      Knee/Hip Exercises: Seated   Sit to Sand 1 set;10 reps;without UE support;Other (comment)   from 24" stool, PT demo, verbal & tactile cues on technique.     Modalities   Modalities Vasopneumatic      Vasopneumatic   Number Minutes Vasopneumatic  10 minutes    Vasopnuematic Location  Knee   positioned in TKE   Vasopneumatic Pressure Medium    Vasopneumatic Temperature  34      Manual Therapy   Manual Therapy Other (comment)    Manual therapy comments strap manual assist terminal knee extension on left while pt stepped  RLE forward / back step through pattern.                    PT Short Term Goals - 02/10/20 1323      PT SHORT TERM GOAL #1   Title Patient demonstrates understanding of initial HEP.    Time 2    Period Weeks    Status New    Target Date 02/25/20  PT Long Term Goals - 02/10/20 1319      PT LONG TERM GOAL #1   Title Patient FOTO score improves to 53%    Time 6    Period Weeks    Status New    Target Date 03/24/20      PT LONG TERM GOAL #2   Title Patient left knee PROM -5* extension to 100* flexion    Time 6    Period Weeks    Status New    Target Date 03/24/20      PT LONG TERM GOAL #3   Title Patient reports left knee pain at rest </= 2/10 and increases <2 increments with standing & gait activities.    Time 6    Period Weeks    Status New    Target Date 03/24/20      PT LONG TERM GOAL #4   Title Patient ambulates 300' with rollator walker & negotiates ramps / curbs modified independent.    Time 6    Period Weeks    Status New    Target Date 03/24/20                 Plan - 02/15/20 1257    Clinical Impression Statement PT session focused manual therapy & exercises on increasing knee extension passively & functionally. PT instructed in gait with step through pattern which she improved.    Personal Factors and Comorbidities Age;Comorbidity 3+;Fitness    Comorbidities OA, CHF, vascular dementia, CVA, CAD, persistent A-Fib, spinal stenosis, right knee replacement ~60yrs ago    Examination-Activity Limitations Locomotion Level;Squat;Stairs;Stand;Transfers    Examination-Participation Restrictions Community Activity    Stability/Clinical Decision Making Stable/Uncomplicated    Rehab Potential Good    PT Frequency 2x / week    PT Duration 6 weeks    PT Treatment/Interventions ADLs/Self Care Home Management;Cryotherapy;Electrical Stimulation;Canalith Repostioning;DME Instruction;Gait training;Stair training;Functional mobility  training;Therapeutic activities;Therapeutic exercise;Balance training;Neuromuscular re-education;Patient/family education;Manual techniques;Scar mobilization;Passive range of motion;Taping;Joint Manipulations;Vasopneumatic Device    PT Next Visit Plan manual therapy & exercise to increase range & strength    PT Home Exercise Plan Access Code: [email or web address]    Consulted and Agree with Plan of Care Patient           Patient will benefit from skilled therapeutic intervention in order to improve the following deficits and impairments:  Abnormal gait,Decreased activity tolerance,Decreased balance,Decreased knowledge of use of DME,Decreased mobility,Decreased range of motion,Decreased strength,Increased edema,Impaired flexibility,Postural dysfunction,Obesity  Visit Diagnosis: Muscle weakness (generalized)  Other abnormalities of gait and mobility  Stiffness of left knee, not elsewhere classified  Localized edema  Acute pain of left knee     Problem List Patient Active Problem List   Diagnosis Date Noted  . Hx of total knee replacement, left 02/01/2020  . 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  . 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 Anemia of chronic disease 09/07/2018  . Primary osteoarthritis of left knee 06/16/2018  . Venous stasis dermatitis of left lower extremity 04/29/2018  . Dementia ([place]) 03/12/2018  . Recurrent cellulitis 02/12/2018  . HTN (hypertension) 02/12/2018  . Cellulitis and abscess of leg 12/29/2017  . CKD (chronic kidney disease) stage 3, GFR 30-59 ml/min (HCC) 12/29/2017  . Cellulitis of left lower extremity   . Ankle edema   . Sepsis ([place]) 12/21/2017  . Medication management 10/11/2016  . Hyperlipemia 06/18/2016  . Gait abnormality 06/18/2016  . Major depressive disorder, recurrent episode, severe ([place]) 11/21/2015  . Edema 02/09/2015  .  History of CVA (cerebrovascular accident) 02/01/2015  . Hemiparesis affecting  dominant side as late effect of stroke ([place])   . 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 Chronic low back pain 01/28/2015  . Short-term memory loss 01/28/2015  . Volume depletion 01/28/2015  . Persistent atrial fibrillation 01/26/2015  . DCM (dilated cardiomyopathy) ([place]) 01/26/2015  . TIA (transient ischemic attack) 01/24/2015  . Chronic diastolic (congestive) heart failure ([place])   . Obesity (BMI 30-39.9) 01/27/2013  . Constipation 09/02/2012  . H/O arthroscopy of right knee 08/06/2012  . Anxiety   . Depression   . GERD (gastroesophageal reflux disease)   . Hypertensive heart disease   . OSA (obstructive sleep apnea)   . Osteoarthritis of right knee   . Hyperparathyroidism, primary ([place]) 04/22/2012  . Chronic cough 10/02/2011    Jamey Reas, PT, DPT 02/15/2020, 1:55 PM  [facility] Physical Therapy [address] [place], [place], [address] Phone: [phone]   Fax:  [phone]  Name: ETHELLE OLA MRN: [id number] Date of Birth: 25-Jul-1940

## 2020-02-17 ENCOUNTER — Ambulatory Visit (INDEPENDENT_AMBULATORY_CARE_PROVIDER_SITE_OTHER): Payer: Medicare Other | Admitting: Physical Therapy

## 2020-02-17 ENCOUNTER — Encounter: Payer: Self-pay | Admitting: Physical Therapy

## 2020-02-17 ENCOUNTER — Other Ambulatory Visit: Payer: Self-pay

## 2020-02-17 DIAGNOSIS — M25562 Pain in left knee: Secondary | ICD-10-CM

## 2020-02-17 DIAGNOSIS — R600 Localized edema: Secondary | ICD-10-CM | POA: Diagnosis not present

## 2020-02-17 DIAGNOSIS — R2689 Other abnormalities of gait and mobility: Secondary | ICD-10-CM

## 2020-02-17 DIAGNOSIS — M6281 Muscle weakness (generalized): Secondary | ICD-10-CM | POA: Diagnosis not present

## 2020-02-17 DIAGNOSIS — M25662 Stiffness of left knee, not elsewhere classified: Secondary | ICD-10-CM | POA: Diagnosis not present

## 2020-02-17 NOTE — Therapy (Signed)
[facility] Physical Therapy [address] [place], [place], [address] Phone: [phone]   Fax:  [phone]  Physical Therapy Treatment  Patient Details  Name: Susan [name] MRN: [id number] Date of Birth: 04/10/1940 Referring Provider (PT): Leandrew Koyanagi, MD   Encounter Date: 02/17/2020   PT End of Session - 02/17/20 1248    Visit Number 3    Number of Visits 13    Date for PT Re-Evaluation 03/24/20    Authorization Type UHC    Authorization Time Period $30 copay    Progress Note Due on Visit 10    PT Start Time 1255    PT Stop Time 1346    PT Time Calculation (min) 51 min    Equipment Utilized During Treatment Gait belt    Activity Tolerance Patient tolerated treatment well;Patient limited by pain    Behavior During Therapy [facility] for tasks assessed/performed           Past Medical History:  Diagnosis Date  . Anxiety   . Arthritis    "legs, back" (07/29/2012)  . Cellulitis and abscess of leg 12/29/2017  . Chronic diastolic (congestive) heart failure ([place])   . Chronic lower back pain   . Complication of anesthesia    "I have apnea" (07/29/2012)  . Dementia ([place]) 03/12/2018  . 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 Family history of anesthesia complication    "some wake up during OR; some are hard to wake up; some both" (07/29/2012)  . GERD (gastroesophageal reflux disease)   . 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 ([place])   . History of CVA (cerebrovascular accident) 02/01/2015  . History of stomach ulcers 1980's  . Hyperlipidemia   . Hyperparathyroidism, primary ([place]) 04/22/2012  . Hypertensive heart disease   . Incontinent of urine    wears pads  . Major depressive disorder, recurrent episode, severe ([place]) 11/21/2015  . OSA on CPAP   . Osteoarthritis of right knee   . Pedal edema   . Persistent atrial fibrillation (HCC)    CHADS2VASC score is 7 - on chronic anticoagulation with Apixaban  . Spinal stenosis   .  Umbilical hernia    "unrepaired" (07/29/2012)  . Varicose veins    "BLE" (07/29/2012)  . Vascular dementia ([place])   . Venous stasis dermatitis of left lower extremity 04/29/2018    Past Surgical History:  Procedure Laterality Date  . CATARACT EXTRACTION    . COLONOSCOPY    . ENDOVENOUS ABLATION SAPHENOUS VEIN W/ LASER Left 06/24/2019   endovenous laser ablation left greater saphenous vein by Gae Gallop MD   . IUD REMOVAL  [phone]  . PARATHYROIDECTOMY N/A 06/16/2012   Procedure: NECK EXPLORATION AND LEFT SUPERIOR PARATHYROIDECTOMY;  Surgeon: Earnstine Regal, MD;  [facility];  Service: General;  Laterality: N/A;  . 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 TOTAL KNEE ARTHROPLASTY Right 07/29/2012   Procedure: TOTAL KNEE ARTHROPLASTY;  Surgeon: Ninetta Lights, MD;  Location: [place];  Service: Orthopedics;  Laterality: Right;  . TOTAL KNEE ARTHROPLASTY Left 12/13/2019   Procedure: LEFT TOTAL KNEE ARTHROPLASTY;  Surgeon: Leandrew Koyanagi, MD;  Location: [place];  Service: Orthopedics;  Laterality: Left;    There were no vitals filed for this visit.   Subjective Assessment - 02/17/20 1249    Subjective She saw PCP who gave her a clean bill of health. He ordered bone scan to rule out osteoporosis.  Her exercises are going well.  Pertinent History OA, CHF, vascular dementia, CVA, CAD, persistent A-Fib, spinal stenosis, right knee replacement ~9yrs ago    Patient Stated Goals to walk comfortably and get out chair.    Currently in Pain? Yes    Pain Score 7     Pain Location Knee    Pain Orientation Left;Anterior    Pain Descriptors / Indicators Sharp    Pain Type Chronic pain;Surgical pain    Pain Onset More than a month ago    Pain Frequency Constant    Aggravating Factors  movement especially after it has gotten stiff    Pain Relieving Factors once she starts moving it    Pain Onset More than a month ago                             [facility] Adult PT Treatment/Exercise  - 02/17/20 1250      Transfers   Transfers Sit to Stand;Stand to Sit    Sit to Stand 5: Supervision;With upper extremity assist;With armrests;From chair/3-in-1;Other (comment)   to locked rollator walker   Stand to Sit 5: Supervision;With upper extremity assist;With armrests;To chair/3-in-1;Other (comment)   using locked rollator walker for stabilization     Ambulation/Gait   Ambulation/Gait Yes    Ambulation/Gait Assistance 5: Supervision    Ambulation/Gait Assistance Details worked on picking up her pace for 25' with cues on longer & quicker steps.  Verbal cues on step through pattern wtih heel passing stance limb toes equally.    Ambulation Distance (Feet) 100 Feet    Assistive device Rollator;Parallel bars    Ambulation Surface Level;Indoor      Exercises   Exercises Knee/Hip      Knee/Hip Exercises: Aerobic   Nustep seat 9 Level 5 with BLEs & BUEs 8 minutes.      Knee/Hip Exercises: Machines for Strengthening   Total Gym Leg Press Shuttle leg press BLEs 75# 15 reps with focus on terminal knee extension.      Knee/Hip Exercises: Standing   Heel Raises Both;10 reps;5 seconds;2 sets   heel raises & toe raises   Heel Raises Limitations verbal cues on technique    Terminal Knee Extension Left;Strengthening;10 reps;Theraband;3 sets    Theraband Level (Terminal Knee Extension) Level 2 (Red)    Terminal Knee Extension Limitations tactile & verbal cues on technique, 3rd set tapping RLE to cone holding terminal knee ext against red theraband    Forward Step Up Right;Left;1 set;5 reps;Hand Hold: 2;Step Height: 4"    Forward Step Up Limitations PT demo, verbal & tactile cues on technique    Step Down Right;Left;1 set;5 reps;Hand Hold: 2;Step Height: 4"    Step Down Limitations PT demo, verbal & tactile cues on technique      Knee/Hip Exercises: Seated   Sit to Sand 1 set;10 reps;without UE support;Other (comment)   from 24" stool, PT demo, verbal & tactile cues on technique.      Modalities   Modalities Vasopneumatic      Vasopneumatic   Number Minutes Vasopneumatic  --    Vasopnuematic Location  --    Vasopneumatic Pressure --    Vasopneumatic Temperature  --      Manual Therapy   Manual Therapy Other (comment)    Manual therapy comments strap manual assist terminal knee extension on left while pt stepped RLE forward / back step through pattern.  PT Short Term Goals - 02/10/20 1323      PT SHORT TERM GOAL #1   Title Patient demonstrates understanding of initial HEP.    Time 2    Period Weeks    Status New    Target Date 02/25/20             PT Long Term Goals - 02/10/20 1319      PT LONG TERM GOAL #1   Title Patient FOTO score improves to 53%    Time 6    Period Weeks    Status New    Target Date 03/24/20      PT LONG TERM GOAL #2   Title Patient left knee PROM -5* extension to 100* flexion    Time 6    Period Weeks    Status New    Target Date 03/24/20      PT LONG TERM GOAL #3   Title Patient reports left knee pain at rest </= 2/10 and increases <2 increments with standing & gait activities.    Time 6    Period Weeks    Status New    Target Date 03/24/20      PT LONG TERM GOAL #4   Title Patient ambulates 300' with rollator walker & negotiates ramps / curbs modified independent.    Time 6    Period Weeks    Status New    Target Date 03/24/20                 Plan - 02/17/20 1249    Clinical Impression Statement PT session focused on increasing functional terminal knee extension & strength.  She is able to improve motion when she is concentrating on it, but resorts back to standing & walking with knee flexion.    Personal Factors and Comorbidities Age;Comorbidity 3+;Fitness    Comorbidities OA, CHF, vascular dementia, CVA, CAD, persistent A-Fib, spinal stenosis, right knee replacement ~52yrs ago    Examination-Activity Limitations Locomotion Level;Squat;Stairs;Stand;Transfers     Examination-Participation Restrictions Community Activity    Stability/Clinical Decision Making Stable/Uncomplicated    Rehab Potential Good    PT Frequency 2x / week    PT Duration 6 weeks    PT Treatment/Interventions ADLs/Self Care Home Management;Cryotherapy;Electrical Stimulation;Canalith Repostioning;DME Instruction;Gait training;Stair training;Functional mobility training;Therapeutic activities;Therapeutic exercise;Balance training;Neuromuscular re-education;Patient/family education;Manual techniques;Scar mobilization;Passive range of motion;Taping;Joint Manipulations;Vasopneumatic Device    PT Next Visit Plan manual therapy & exercise to increase range & strength    PT Home Exercise Plan Access Code: [email or web address]    Consulted and Agree with Plan of Care Patient           Patient will benefit from skilled therapeutic intervention in order to improve the following deficits and impairments:  Abnormal gait,Decreased activity tolerance,Decreased balance,Decreased knowledge of use of DME,Decreased mobility,Decreased range of motion,Decreased strength,Increased edema,Impaired flexibility,Postural dysfunction,Obesity  Visit Diagnosis: Muscle weakness (generalized)  Other abnormalities of gait and mobility  Stiffness of left knee, not elsewhere classified  Localized edema  Acute pain of left knee     Problem List Patient Active Problem List   Diagnosis Date Noted  . Hx of total knee replacement, left 02/01/2020  . 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  . 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 Anemia of chronic disease 09/07/2018  . Primary osteoarthritis of left knee 06/16/2018  . Venous stasis dermatitis of left lower extremity 04/29/2018  . Dementia ([place]) 03/12/2018  . Recurrent cellulitis 02/12/2018  . HTN (hypertension)  02/12/2018  . Cellulitis and abscess of leg 12/29/2017  . CKD (chronic kidney disease) stage 3, GFR 30-59 ml/min  (HCC) 12/29/2017  . Cellulitis of left lower extremity   . Ankle edema   . Sepsis ([place]) 12/21/2017  . Medication management 10/11/2016  . Hyperlipemia 06/18/2016  . Gait abnormality 06/18/2016  . Major depressive disorder, recurrent episode, severe ([place]) 11/21/2015  . Edema 02/09/2015  . History of CVA (cerebrovascular accident) 02/01/2015  . 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 ([place])   . 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 Chronic low back pain 01/28/2015  . Short-term memory loss 01/28/2015  . Volume depletion 01/28/2015  . Persistent atrial fibrillation 01/26/2015  . DCM (dilated cardiomyopathy) ([place]) 01/26/2015  . TIA (transient ischemic attack) 01/24/2015  . Chronic diastolic (congestive) heart failure ([place])   . Obesity (BMI 30-39.9) 01/27/2013  . Constipation 09/02/2012  . H/O arthroscopy of right knee 08/06/2012  . Anxiety   . Depression   . GERD (gastroesophageal reflux disease)   . Hypertensive heart disease   . OSA (obstructive sleep apnea)   . Osteoarthritis of right knee   . Hyperparathyroidism, primary ([place]) 04/22/2012  . Chronic cough 10/02/2011    Jamey Reas, PT, DPT 02/17/2020, 1:52 PM  [facility] Physical Therapy [address] [place], [place], [address] Phone: [phone]   Fax:  [phone]  Name: Susan [name] MRN: [id number] Date of Birth: 1940-09-09

## 2020-02-22 ENCOUNTER — Other Ambulatory Visit: Payer: Self-pay

## 2020-02-22 ENCOUNTER — Ambulatory Visit (INDEPENDENT_AMBULATORY_CARE_PROVIDER_SITE_OTHER): Payer: Medicare Other | Admitting: Physical Therapy

## 2020-02-22 ENCOUNTER — Encounter: Payer: Medicare Other | Admitting: Physical Therapy

## 2020-02-22 ENCOUNTER — Encounter: Payer: Self-pay | Admitting: Physical Therapy

## 2020-02-22 DIAGNOSIS — R600 Localized edema: Secondary | ICD-10-CM | POA: Diagnosis not present

## 2020-02-22 DIAGNOSIS — M6281 Muscle weakness (generalized): Secondary | ICD-10-CM | POA: Diagnosis not present

## 2020-02-22 DIAGNOSIS — M25662 Stiffness of left knee, not elsewhere classified: Secondary | ICD-10-CM

## 2020-02-22 DIAGNOSIS — M25562 Pain in left knee: Secondary | ICD-10-CM

## 2020-02-22 DIAGNOSIS — R2689 Other abnormalities of gait and mobility: Secondary | ICD-10-CM

## 2020-02-22 NOTE — Therapy (Signed)
[facility] [address] [place], [place], [address] Phone: [phone]   Fax:  [phone]  Physical Therapy Treatment  Patient Details  Name: Susan [name] MRN: [id number] Date of Birth: 1940-08-20 Referring Provider (PT): Leandrew Koyanagi, MD   Encounter Date: 02/22/2020   PT End of Session - 02/22/20 0908    Visit Number 4    Number of Visits 13    Date for PT Re-Evaluation 03/24/20    Authorization Type UHC    Authorization Time Period $30 copay    Progress Note Due on Visit 10    PT Start Time 0910    PT Stop Time 1002    PT Time Calculation (min) 52 min    Equipment Utilized During Treatment Gait belt    Activity Tolerance Patient tolerated treatment well;Patient limited by pain    Behavior During Therapy [facility] for tasks assessed/performed           Past Medical History:  Diagnosis Date  . Anxiety   . Arthritis    "legs, back" (07/29/2012)  . Cellulitis and abscess of leg 12/29/2017  . Chronic diastolic (congestive) heart failure ([place])   . Chronic lower back pain   . Complication of anesthesia    "I have apnea" (07/29/2012)  . Dementia ([place]) 03/12/2018  . 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 Family history of anesthesia complication    "some wake up during OR; some are hard to wake up; some both" (07/29/2012)  . GERD (gastroesophageal reflux disease)   . 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 ([place])   . History of CVA (cerebrovascular accident) 02/01/2015  . History of stomach ulcers 1980's  . Hyperlipidemia   . Hyperparathyroidism, primary ([place]) 04/22/2012  . Hypertensive heart disease   . Incontinent of urine    wears pads  . Major depressive disorder, recurrent episode, severe ([place]) 11/21/2015  . OSA on CPAP   . Osteoarthritis of right knee   . Pedal edema   . Persistent atrial fibrillation (HCC)    CHADS2VASC score is 7 - on chronic anticoagulation with Apixaban  . Spinal stenosis   .  Umbilical hernia    "unrepaired" (07/29/2012)  . Varicose veins    "BLE" (07/29/2012)  . Vascular dementia ([place])   . Venous stasis dermatitis of left lower extremity 04/29/2018    Past Surgical History:  Procedure Laterality Date  . CATARACT EXTRACTION    . COLONOSCOPY    . ENDOVENOUS ABLATION SAPHENOUS VEIN W/ LASER Left 06/24/2019   endovenous laser ablation left greater saphenous vein by Gae Gallop MD   . IUD REMOVAL  [phone]  . PARATHYROIDECTOMY N/A 06/16/2012   Procedure: NECK EXPLORATION AND LEFT SUPERIOR PARATHYROIDECTOMY;  Surgeon: Earnstine Regal, MD;  [facility];  Service: General;  Laterality: N/A;  . 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 TOTAL KNEE ARTHROPLASTY Right 07/29/2012   Procedure: TOTAL KNEE ARTHROPLASTY;  Surgeon: Ninetta Lights, MD;  Location: [place];  Service: Orthopedics;  Laterality: Right;  . TOTAL KNEE ARTHROPLASTY Left 12/13/2019   Procedure: LEFT TOTAL KNEE ARTHROPLASTY;  Surgeon: Leandrew Koyanagi, MD;  Location: [place];  Service: Orthopedics;  Laterality: Left;    There were no vitals filed for this visit.   Subjective Assessment - 02/22/20 0909    Subjective She has been exercising breaking them up throughout day as PT recommended. She is getting up easier but slow. She has been working on taking  full steps as PT instructed.    Pertinent History OA, CHF, vascular dementia, CVA, CAD, persistent A-Fib, spinal stenosis, right knee replacement ~69yrs ago    Patient Stated Goals to walk comfortably and get out chair.    Currently in Pain? Yes   ranges from 3/10 to 7-8/10   Pain Score 6     Pain Location Knee    Pain Orientation Left;Medial    Pain Descriptors / Indicators Sharp    Pain Type Chronic pain;Surgical pain    Pain Onset More than a month ago    Pain Frequency Intermittent    Aggravating Factors  motion like max flexion especially after sitting for awhile    Pain Relieving Factors exercising / motion    Pain Onset More than a month ago                              [facility] Adult PT Treatment/Exercise - 02/22/20 0908      Transfers   Transfers Sit to Stand;Stand to Sit    Sit to Stand 5: Supervision;With upper extremity assist;With armrests;From chair/3-in-1;Other (comment)   to locked rollator walker   Stand to Sit 5: Supervision;With upper extremity assist;With armrests;To chair/3-in-1;Other (comment)   using locked rollator walker for stabilization     Ambulation/Gait   Ambulation/Gait Yes    Ambulation/Gait Assistance 5: Supervision    Ambulation Distance (Feet) 100 Feet    Assistive device Rollator;Parallel bars      Exercises   Exercises Knee/Hip      Knee/Hip Exercises: Stretches   Active Hamstring Stretch Left;3 reps;20 seconds    Active Hamstring Stretch Limitations LE extended with strap DF      Knee/Hip Exercises: Aerobic   Nustep seat 9 Level 7 with BLEs & BUEs 8 minutes.      Knee/Hip Exercises: Machines for Strengthening   Total Gym Leg Press Shuttle leg press BLEs 81# 15 reps 2 sets with focus on terminal knee extension.      Knee/Hip Exercises: Standing   Heel Raises Both;10 reps;5 seconds;2 sets   heel raises & toe raises   Heel Raises Limitations verbal cues on technique    Terminal Knee Extension Left;Strengthening;10 reps;Theraband;3 sets    Theraband Level (Terminal Knee Extension) Level 3 (Green)    Terminal Knee Extension Limitations tactile & verbal cues on technique, 2nd & 3rd set tapping RLE stepping over 2" ht 10" length step while  terminal knee ext initial contact thru stance then terminal stance position against green theraband    Forward Step Up --    Forward Step Up Limitations --    Step Down --    Step Down Limitations --    Rocker Board 1 minute   BUE support //bars, round board, ant/mid/post & right/mid/left   Rocker Board Limitations mirror, verbal & manual/tactile cues for technique & balance reactions.      Knee/Hip Exercises: Seated   Sit to Sand 1  set;10 reps;without UE support;Other (comment)   from 24" stool, PT demo, verbal & tactile cues on technique.     Modalities   Modalities Vasopneumatic      Manual Therapy   Manual Therapy Other (comment)    Manual therapy comments strap manual assist terminal knee extension on left while pt stepped RLE forward / back step through pattern.  PT Short Term Goals - 02/10/20 1323      PT SHORT TERM GOAL #1   Title Patient demonstrates understanding of initial HEP.    Time 2    Period Weeks    Status New    Target Date 02/25/20             PT Long Term Goals - 02/10/20 1319      PT LONG TERM GOAL #1   Title Patient FOTO score improves to 53%    Time 6    Period Weeks    Status New    Target Date 03/24/20      PT LONG TERM GOAL #2   Title Patient left knee PROM -5* extension to 100* flexion    Time 6    Period Weeks    Status New    Target Date 03/24/20      PT LONG TERM GOAL #3   Title Patient reports left knee pain at rest </= 2/10 and increases <2 increments with standing & gait activities.    Time 6    Period Weeks    Status New    Target Date 03/24/20      PT LONG TERM GOAL #4   Title Patient ambulates 300' with rollator walker & negotiates ramps / curbs modified independent.    Time 6    Period Weeks    Status New    Target Date 03/24/20                 Plan - 02/22/20 0908    Clinical Impression Statement Patient is functionally improving movements in sit/stand, standing & gait, but continues to have slow cautious speed.  PT progressed difficulty of exercises today.    Personal Factors and Comorbidities Age;Comorbidity 3+;Fitness    Comorbidities OA, CHF, vascular dementia, CVA, CAD, persistent A-Fib, spinal stenosis, right knee replacement ~61yrs ago    Examination-Activity Limitations Locomotion Level;Squat;Stairs;Stand;Transfers    Examination-Participation Restrictions Community Activity    Stability/Clinical  Decision Making Stable/Uncomplicated    Rehab Potential Good    PT Frequency 2x / week    PT Duration 6 weeks    PT Treatment/Interventions ADLs/Self Care Home Management;Cryotherapy;Electrical Stimulation;Canalith Repostioning;DME Instruction;Gait training;Stair training;Functional mobility training;Therapeutic activities;Therapeutic exercise;Balance training;Neuromuscular re-education;Patient/family education;Manual techniques;Scar mobilization;Passive range of motion;Taping;Joint Manipulations;Vasopneumatic Device    PT Next Visit Plan manual therapy & exercise to increase range & strength    PT Home Exercise Plan Access Code: [email or web address]    Consulted and Agree with Plan of Care Patient           Patient will benefit from skilled therapeutic intervention in order to improve the following deficits and impairments:  Abnormal gait,Decreased activity tolerance,Decreased balance,Decreased knowledge of use of DME,Decreased mobility,Decreased range of motion,Decreased strength,Increased edema,Impaired flexibility,Postural dysfunction,Obesity  Visit Diagnosis: Muscle weakness (generalized)  Other abnormalities of gait and mobility  Stiffness of left knee, not elsewhere classified  Localized edema  Acute pain of left knee     Problem List Patient Active Problem List   Diagnosis Date Noted  . Hx of total knee replacement, left 02/01/2020  . 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  . 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 Anemia of chronic disease 09/07/2018  . Primary osteoarthritis of left knee 06/16/2018  . Venous stasis dermatitis of left lower extremity 04/29/2018  . Dementia ([place]) 03/12/2018  . Recurrent cellulitis 02/12/2018  . HTN (hypertension) 02/12/2018  . Cellulitis and abscess of leg 12/29/2017  .  CKD (chronic kidney disease) stage 3, GFR 30-59 ml/min (HCC) 12/29/2017  . Cellulitis of left lower extremity   . Ankle edema   . Sepsis  ([place]) 12/21/2017  . Medication management 10/11/2016  . Hyperlipemia 06/18/2016  . Gait abnormality 06/18/2016  . Major depressive disorder, recurrent episode, severe ([place]) 11/21/2015  . Edema 02/09/2015  . History of CVA (cerebrovascular accident) 02/01/2015  . 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 ([place])   . 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 Chronic low back pain 01/28/2015  . Short-term memory loss 01/28/2015  . Volume depletion 01/28/2015  . Persistent atrial fibrillation 01/26/2015  . DCM (dilated cardiomyopathy) ([place]) 01/26/2015  . TIA (transient ischemic attack) 01/24/2015  . Chronic diastolic (congestive) heart failure ([place])   . Obesity (BMI 30-39.9) 01/27/2013  . Constipation 09/02/2012  . H/O arthroscopy of right knee 08/06/2012  . Anxiety   . Depression   . GERD (gastroesophageal reflux disease)   . Hypertensive heart disease   . OSA (obstructive sleep apnea)   . Osteoarthritis of right knee   . Hyperparathyroidism, primary ([place]) 04/22/2012  . Chronic cough 10/02/2011    Jamey Reas, PT, DPT 02/22/2020, 10:06 AM  [facility] [address] [place], [place], [address] Phone: [phone]   Fax:  [phone]  Name: Susan [name] MRN: [id number] Date of Birth: 1940-10-13

## 2020-02-24 ENCOUNTER — Other Ambulatory Visit: Payer: Self-pay

## 2020-02-24 ENCOUNTER — Ambulatory Visit (INDEPENDENT_AMBULATORY_CARE_PROVIDER_SITE_OTHER): Payer: Medicare Other | Admitting: Rehabilitative and Restorative Service Providers"

## 2020-02-24 ENCOUNTER — Encounter: Payer: Self-pay | Admitting: Rehabilitative and Restorative Service Providers"

## 2020-02-24 DIAGNOSIS — R600 Localized edema: Secondary | ICD-10-CM

## 2020-02-24 DIAGNOSIS — M6281 Muscle weakness (generalized): Secondary | ICD-10-CM

## 2020-02-24 DIAGNOSIS — R262 Difficulty in walking, not elsewhere classified: Secondary | ICD-10-CM | POA: Diagnosis not present

## 2020-02-24 DIAGNOSIS — M25562 Pain in left knee: Secondary | ICD-10-CM | POA: Diagnosis not present

## 2020-02-24 DIAGNOSIS — M25662 Stiffness of left knee, not elsewhere classified: Secondary | ICD-10-CM | POA: Diagnosis not present

## 2020-02-24 NOTE — Patient Instructions (Signed)
Focus on quadriceps sets 100/Day (extension AROM and quadriceps strength) and heel slides (flexion AROM) with belt 30/Day with HEP.

## 2020-02-24 NOTE — Therapy (Signed)
[facility] [address] [place], [place], [address] Phone: [phone]   Fax:  [phone]  Physical Therapy Treatment  Patient Details  Name: Susan [name] MRN: [id number] Date of Birth: 11-28-1940 Referring Provider (PT): Leandrew Koyanagi, MD   Encounter Date: 02/24/2020   PT End of Session - 02/24/20 1654    Visit Number 5    Number of Visits 13    Date for PT Re-Evaluation 03/24/20    Authorization Type UHC    Authorization Time Period $30 copay    Progress Note Due on Visit 10    PT Start Time 1015    PT Stop Time 1057    PT Time Calculation (min) 42 min    Equipment Utilized During Treatment Gait belt    Activity Tolerance Patient tolerated treatment well;Patient limited by pain    Behavior During Therapy [facility] for tasks assessed/performed           Past Medical History:  Diagnosis Date  . Anxiety   . Arthritis    "legs, back" (07/29/2012)  . Cellulitis and abscess of leg 12/29/2017  . Chronic diastolic (congestive) heart failure ([place])   . Chronic lower back pain   . Complication of anesthesia    "I have apnea" (07/29/2012)  . Dementia ([place]) 03/12/2018  . 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 Family history of anesthesia complication    "some wake up during OR; some are hard to wake up; some both" (07/29/2012)  . GERD (gastroesophageal reflux disease)   . 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 ([place])   . History of CVA (cerebrovascular accident) 02/01/2015  . History of stomach ulcers 1980's  . Hyperlipidemia   . Hyperparathyroidism, primary ([place]) 04/22/2012  . Hypertensive heart disease   . Incontinent of urine    wears pads  . Major depressive disorder, recurrent episode, severe ([place]) 11/21/2015  . OSA on CPAP   . Osteoarthritis of right knee   . Pedal edema   . Persistent atrial fibrillation (HCC)    CHADS2VASC score is 7 - on chronic anticoagulation with Apixaban  . Spinal stenosis   .  Umbilical hernia    "unrepaired" (07/29/2012)  . Varicose veins    "BLE" (07/29/2012)  . Vascular dementia ([place])   . Venous stasis dermatitis of left lower extremity 04/29/2018    Past Surgical History:  Procedure Laterality Date  . CATARACT EXTRACTION    . COLONOSCOPY    . ENDOVENOUS ABLATION SAPHENOUS VEIN W/ LASER Left 06/24/2019   endovenous laser ablation left greater saphenous vein by Gae Gallop MD   . IUD REMOVAL  [phone]  . PARATHYROIDECTOMY N/A 06/16/2012   Procedure: NECK EXPLORATION AND LEFT SUPERIOR PARATHYROIDECTOMY;  Surgeon: Earnstine Regal, MD;  [facility];  Service: General;  Laterality: N/A;  . 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 TOTAL KNEE ARTHROPLASTY Right 07/29/2012   Procedure: TOTAL KNEE ARTHROPLASTY;  Surgeon: Ninetta Lights, MD;  Location: [place];  Service: Orthopedics;  Laterality: Right;  . TOTAL KNEE ARTHROPLASTY Left 12/13/2019   Procedure: LEFT TOTAL KNEE ARTHROPLASTY;  Surgeon: Leandrew Koyanagi, MD;  Location: [place];  Service: Orthopedics;  Laterality: Left;    There were no vitals filed for this visit.   Subjective Assessment - 02/24/20 1057    Subjective Sleep is not affected by L knee pain.  L knee stiffness affects gait, WB function and prolonged postures.    Pertinent History OA,  CHF, vascular dementia, CVA, CAD, persistent A-Fib, spinal stenosis, right knee replacement ~79yrs ago    Limitations Standing;Walking    How long can you stand comfortably? 5 minutes    How long can you walk comfortably? 5 minutes    Patient Stated Goals to walk comfortably and get out chair.    Currently in Pain? Yes    Pain Score 3     Pain Location Knee    Pain Orientation Left    Pain Descriptors / Indicators Aching;Tightness    Pain Type Chronic pain;Surgical pain    Pain Onset More than a month ago    Pain Frequency Intermittent    Aggravating Factors  WB function and end range AROM    Pain Relieving Factors Movement    Effect of Pain on Daily  Activities Limits endurance with WB function and needs a walker to walk    Multiple Pain Sites No    Pain Onset More than a month ago              [facility] PT Assessment - 02/24/20 0001      AROM   Left Knee Extension -10    Left Knee Flexion 98                         [facility] Adult PT Treatment/Exercise - 02/24/20 0001      Exercises   Exercises Knee/Hip      Knee/Hip Exercises: Aerobic   Recumbent Bike Seat 5 for 8 minutes emphasis on extension reach and flexion AAROM for 8 minutes      Knee/Hip Exercises: Supine   Quad Sets Strengthening;Both;3 sets;10 reps;Other (comment)    Quad Sets Limitations 5 seconds (toes back, push heel out, press down knee and tighten thigh)    Heel Slides AAROM;Left;10 reps;Other (comment)    Heel Slides Limitations 10 seconds with belt                  PT Education - 02/24/20 1106    Education Details Discussed the importance of and focus on extension AROM.  Reviewed and updated HEP.    Person(s) Educated Patient    Methods Explanation;Demonstration;Tactile cues;Verbal cues;Handout    Comprehension Tactile cues required;Verbal cues required;Returned demonstration;Verbalized understanding;Need further instruction            PT Short Term Goals - 02/24/20 1107      PT SHORT TERM GOAL #1   Title Patient demonstrates understanding of initial HEP.    Time 2    Period Weeks    Status On-going    Target Date 02/25/20             PT Long Term Goals - 02/24/20 1107      PT LONG TERM GOAL #1   Title Patient FOTO score improves to 53%    Time 6    Period Weeks    Status On-going      PT LONG TERM GOAL #2   Title Patient left knee PROM -5* extension to 100* flexion    Baseline -10 extension and 98 flexion on 02/24/2020    Time 6    Period Weeks    Status On-going      PT LONG TERM GOAL #3   Title Patient reports left knee pain at rest </= 2/10 and increases <2 increments with standing & gait activities.     Time 6    Period Weeks    Status  On-going      PT LONG TERM GOAL #4   Title Patient ambulates 300' with rollator walker & negotiates ramps / curbs modified independent.    Time 6    Period Weeks    Status On-going                 Plan - 02/24/20 [place] is making good objective progress towards long-term goals.  Extension AROM is now -10 degrees (was -19) and flexion 98 (was 92).  Both remain the focus of Relena's HEP.  Continue emphasis on AROM (particularly extension) with HEP and add strengthening and balance activities to improve gait and WB function as appropriate.    Personal Factors and Comorbidities Age;Comorbidity 3+;Fitness    Comorbidities OA, CHF, vascular dementia, CVA, CAD, persistent A-Fib, spinal stenosis, right knee replacement ~6yrs ago    Examination-Activity Limitations Locomotion Level;Squat;Stairs;Stand;Transfers    Examination-Participation Restrictions Community Activity    Stability/Clinical Decision Making Stable/Uncomplicated    Rehab Potential Good    PT Frequency 2x / week    PT Duration 6 weeks    PT Treatment/Interventions ADLs/Self Care Home Management;Cryotherapy;Electrical Stimulation;Canalith Repostioning;DME Instruction;Gait training;Stair training;Functional mobility training;Therapeutic activities;Therapeutic exercise;Balance training;Neuromuscular re-education;Patient/family education;Manual techniques;Scar mobilization;Passive range of motion;Taping;Joint Manipulations;Vasopneumatic Device    PT Next Visit Plan manual therapy & exercise to increase range & strength    PT Home Exercise Plan Access Code: [email or web address]    Consulted and Agree with Plan of Care Patient           Patient will benefit from skilled therapeutic intervention in order to improve the following deficits and impairments:  Abnormal gait,Decreased activity tolerance,Decreased balance,Decreased knowledge of use of DME,Decreased  mobility,Decreased range of motion,Decreased strength,Increased edema,Impaired flexibility,Postural dysfunction,Obesity  Visit Diagnosis: Difficulty walking  Muscle weakness (generalized)  Stiffness of left knee, not elsewhere classified  Acute pain of left knee  Localized edema     Problem List Patient Active Problem List   Diagnosis Date Noted  . Hx of total knee replacement, left 02/01/2020  . 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  . 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 Anemia of chronic disease 09/07/2018  . Primary osteoarthritis of left knee 06/16/2018  . Venous stasis dermatitis of left lower extremity 04/29/2018  . Dementia ([place]) 03/12/2018  . Recurrent cellulitis 02/12/2018  . HTN (hypertension) 02/12/2018  . Cellulitis and abscess of leg 12/29/2017  . CKD (chronic kidney disease) stage 3, GFR 30-59 ml/min (HCC) 12/29/2017  . Cellulitis of left lower extremity   . Ankle edema   . Sepsis ([place]) 12/21/2017  . Medication management 10/11/2016  . Hyperlipemia 06/18/2016  . Gait abnormality 06/18/2016  . Major depressive disorder, recurrent episode, severe ([place]) 11/21/2015  . Edema 02/09/2015  . History of CVA (cerebrovascular accident) 02/01/2015  . 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 ([place])   . 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 Chronic low back pain 01/28/2015  . Short-term memory loss 01/28/2015  . Volume depletion 01/28/2015  . Persistent atrial fibrillation 01/26/2015  . DCM (dilated cardiomyopathy) ([place]) 01/26/2015  . TIA (transient ischemic attack) 01/24/2015  . Chronic diastolic (congestive) heart failure ([place])   . Obesity (BMI 30-39.9) 01/27/2013  . Constipation 09/02/2012  . H/O arthroscopy of right knee 08/06/2012  . Anxiety   . Depression   . GERD (gastroesophageal reflux disease)   . Hypertensive heart disease   . OSA (obstructive sleep apnea)   .  Osteoarthritis of right knee   . Hyperparathyroidism, primary ([place]) 04/22/2012  . Chronic cough 10/02/2011    Farley Ly PT, MPT 02/24/2020, 4:57 PM  [facility] [address] [place], [place], [address] Phone: [phone]   Fax:  [phone]  Name: Susan [name] MRN: [id number] Date of Birth: Sep 18, 1940

## 2020-02-29 ENCOUNTER — Other Ambulatory Visit: Payer: Self-pay | Admitting: Family

## 2020-02-29 DIAGNOSIS — E2839 Other primary ovarian failure: Secondary | ICD-10-CM

## 2020-02-29 DIAGNOSIS — Z13820 Encounter for screening for osteoporosis: Secondary | ICD-10-CM

## 2020-03-01 ENCOUNTER — Ambulatory Visit (INDEPENDENT_AMBULATORY_CARE_PROVIDER_SITE_OTHER): Payer: Medicare Other | Admitting: Rehabilitative and Restorative Service Providers"

## 2020-03-01 ENCOUNTER — Other Ambulatory Visit: Payer: Self-pay

## 2020-03-01 ENCOUNTER — Encounter: Payer: Self-pay | Admitting: Rehabilitative and Restorative Service Providers"

## 2020-03-01 DIAGNOSIS — R262 Difficulty in walking, not elsewhere classified: Secondary | ICD-10-CM | POA: Diagnosis not present

## 2020-03-01 DIAGNOSIS — G8929 Other chronic pain: Secondary | ICD-10-CM

## 2020-03-01 DIAGNOSIS — M25662 Stiffness of left knee, not elsewhere classified: Secondary | ICD-10-CM

## 2020-03-01 DIAGNOSIS — R600 Localized edema: Secondary | ICD-10-CM | POA: Diagnosis not present

## 2020-03-01 DIAGNOSIS — M25562 Pain in left knee: Secondary | ICD-10-CM | POA: Insufficient documentation

## 2020-03-01 DIAGNOSIS — M6281 Muscle weakness (generalized): Secondary | ICD-10-CM

## 2020-03-01 NOTE — Therapy (Signed)
[facility] Physical Therapy [address] [place], [place], [address] Phone: [phone]   Fax:  [phone]  Physical Therapy Treatment  Patient Details  Name: Susan [name] MRN: [id number] Date of Birth: 1940-02-23 Referring Provider (PT): Leandrew Koyanagi, MD   Encounter Date: 03/01/2020   PT End of Session - 03/01/20 1353    Visit Number 6    Number of Visits 13    Date for PT Re-Evaluation 03/24/20    Authorization Type UHC    Authorization Time Period $30 copay    Progress Note Due on Visit 10    PT Start Time 1301    PT Stop Time 1356    PT Time Calculation (min) 55 min    Equipment Utilized During Treatment Gait belt    Activity Tolerance Patient tolerated treatment well;Patient limited by pain    Behavior During Therapy [facility] for tasks assessed/performed           Past Medical History:  Diagnosis Date  . Anxiety   . Arthritis    "legs, back" (07/29/2012)  . Cellulitis and abscess of leg 12/29/2017  . Chronic diastolic (congestive) heart failure ([place])   . Chronic lower back pain   . Complication of anesthesia    "I have apnea" (07/29/2012)  . Dementia ([place]) 03/12/2018  . 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 Family history of anesthesia complication    "some wake up during OR; some are hard to wake up; some both" (07/29/2012)  . GERD (gastroesophageal reflux disease)   . 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 ([place])   . History of CVA (cerebrovascular accident) 02/01/2015  . History of stomach ulcers 1980's  . Hyperlipidemia   . Hyperparathyroidism, primary ([place]) 04/22/2012  . Hypertensive heart disease   . Incontinent of urine    wears pads  . Major depressive disorder, recurrent episode, severe ([place]) 11/21/2015  . OSA on CPAP   . Osteoarthritis of right knee   . Pedal edema   . Persistent atrial fibrillation (HCC)    CHADS2VASC score is 7 - on chronic anticoagulation with Apixaban  . Spinal stenosis   .  Umbilical hernia    "unrepaired" (07/29/2012)  . Varicose veins    "BLE" (07/29/2012)  . Vascular dementia ([place])   . Venous stasis dermatitis of left lower extremity 04/29/2018    Past Surgical History:  Procedure Laterality Date  . CATARACT EXTRACTION    . COLONOSCOPY    . ENDOVENOUS ABLATION SAPHENOUS VEIN W/ LASER Left 06/24/2019   endovenous laser ablation left greater saphenous vein by Gae Gallop MD   . IUD REMOVAL  [phone]  . PARATHYROIDECTOMY N/A 06/16/2012   Procedure: NECK EXPLORATION AND LEFT SUPERIOR PARATHYROIDECTOMY;  Surgeon: Earnstine Regal, MD;  [facility];  Service: General;  Laterality: N/A;  . 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 TOTAL KNEE ARTHROPLASTY Right 07/29/2012   Procedure: TOTAL KNEE ARTHROPLASTY;  Surgeon: Ninetta Lights, MD;  Location: [place];  Service: Orthopedics;  Laterality: Right;  . TOTAL KNEE ARTHROPLASTY Left 12/13/2019   Procedure: LEFT TOTAL KNEE ARTHROPLASTY;  Surgeon: Leandrew Koyanagi, MD;  Location: [place];  Service: Orthopedics;  Laterality: Left;    There were no vitals filed for this visit.   Subjective Assessment - 03/01/20 1310    Subjective Susan [name] reports using the walker all the time.  She was using it full-time pre-surgery.    Pertinent History OA, CHF, vascular dementia, CVA,  CAD, persistent A-Fib, spinal stenosis, right knee replacement ~81yr ago    Limitations Standing;Walking    How long can you stand comfortably? 5 minutes    How long can you walk comfortably? 5 minutes    Patient Stated Goals to walk comfortably and get out chair.    Currently in Pain? Yes    Pain Score 3     Pain Location Knee    Pain Orientation Left    Pain Descriptors / Indicators Aching;Tightness    Pain Type Chronic pain;Surgical pain    Pain Onset More than a month ago    Pain Frequency Constant    Aggravating Factors  WB function (endurance and gait quality)    Pain Relieving Factors Change of position    Effect of Pain on Daily Activities  Limited WB function and relies on a walker    Multiple Pain Sites No    Pain Onset More than a month ago              [facility] - 03/01/20 0001      AROM   Left Knee Extension -7    Left Knee Flexion 106                         [facility] Adult PT Treatment/Exercise - 03/01/20 0001      Neuro Re-ed    Neuro Re-ed Details  Heel to toe balance 5X 20 seconds      Exercises   Exercises Knee/Hip      Knee/Hip Exercises: Aerobic   Recumbent Bike Seat 5 for 8 minutes emphasis on extension reach and flexion AAROM for 8 minutes      Knee/Hip Exercises: Machines for Strengthening   Total Gym Leg Press Leg press 87# 2 sets of 10 slow eccentrics double leg and 50# single leg 10X  (L only) slow eccentrics      Knee/Hip Exercises: Supine   Quad Sets Strengthening;Both;3 sets;10 reps;Other (comment)    Quad Sets Limitations 5 seconds (toes pulled back)    Heel Slides AAROM;Left;10 reps;Other (comment)    Heel Slides Limitations 10 seconds with belt                  PT Education - 03/01/20 1351    Education Details Continue 100 quadriceps sets and 2-3X/day with knee flexion AROM with HEP.    Person(s) Educated Patient    Methods Explanation;Demonstration;Tactile cues;Verbal cues    Comprehension Tactile cues required;Need further instruction;Verbal cues required;Returned demonstration;Verbalized understanding            PT Short Term Goals - 03/01/20 1352      PT SHORT TERM GOAL #1   Title Patient demonstrates understanding of initial HEP.    Time 2    Period Weeks    Status Achieved    Target Date 02/25/20             PT Long Term Goals - 03/01/20 1352      PT LONG TERM GOAL #1   Title Patient FOTO score improves to 53%    Time 6    Period Weeks    Status On-going      PT LONG TERM GOAL #2   Title Patient left knee PROM -5* extension to 100* flexion    Baseline -10 extension and 98 flexion on 02/24/2020    Time 6    Period Weeks     Status Partially Met  PT LONG TERM GOAL #3   Title Patient reports left knee pain at rest </= 2/10 and increases <2 increments with standing & gait activities.    Time 6    Period Weeks    Status On-going      PT LONG TERM GOAL #4   Title Patient ambulates 300' with rollator walker & negotiates ramps / curbs modified independent.    Time 6    Period Weeks    Status On-going                 Plan - 03/01/20 1353    Clinical Impression Statement AROM has made excellent progress since Susan [name]'s last visit.  AROM will remain a focus with Susan [name]'s HEP while strength and balance will be increased in the clinic.  Continue POC to meet LTGs.    Personal Factors and Comorbidities Age;Comorbidity 3+;Fitness    Comorbidities OA, CHF, vascular dementia, CVA, CAD, persistent A-Fib, spinal stenosis, right knee replacement ~37yr ago    Examination-Activity Limitations Locomotion Level;Squat;Stairs;Stand;Transfers    Examination-Participation Restrictions Community Activity    Stability/Clinical Decision Making Stable/Uncomplicated    Rehab Potential Good    PT Frequency 2x / week    PT Duration 6 weeks    PT Treatment/Interventions ADLs/Self Care Home Management;Cryotherapy;Electrical Stimulation;Canalith Repostioning;DME Instruction;Gait training;Stair training;Functional mobility training;Therapeutic activities;Therapeutic exercise;Balance training;Neuromuscular re-education;Patient/family education;Manual techniques;Scar mobilization;Passive range of motion;Taping;Joint Manipulations;Vasopneumatic Device    PT Next Visit Plan AROM, quadriceps strength and balance activities    PT Home Exercise Plan Access Code: [email or web address]   Consulted and Agree with Plan of Care Patient           Patient will benefit from skilled therapeutic intervention in order to improve the following deficits and impairments:  Abnormal gait,Decreased activity tolerance,Decreased balance,Decreased knowledge of use of  DME,Decreased mobility,Decreased range of motion,Decreased strength,Increased edema,Impaired flexibility,Postural dysfunction,Obesity  Visit Diagnosis: Difficulty walking  Muscle weakness (generalized)  Stiffness of left knee, not elsewhere classified  Localized edema  Chronic pain of left knee     Problem List Patient Active Problem List   Diagnosis Date Noted  . Acute pain of left knee 03/01/2020  . Hx of total knee replacement, left 02/01/2020  . 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  . 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 Anemia of chronic disease 09/07/2018  . Primary osteoarthritis of left knee 06/16/2018  . Venous stasis dermatitis of left lower extremity 04/29/2018  . Dementia ([place] 03/12/2018  . Recurrent cellulitis 02/12/2018  . HTN (hypertension) 02/12/2018  . Cellulitis and abscess of leg 12/29/2017  . CKD (chronic kidney disease) stage 3, GFR 30-59 ml/min (HCC) 12/29/2017  . Cellulitis of left lower extremity   . Ankle edema   . Sepsis ([place] 12/21/2017  . Medication management 10/11/2016  . Hyperlipemia 06/18/2016  . Gait abnormality 06/18/2016  . Major depressive disorder, recurrent episode, severe ([place] 11/21/2015  . Edema 02/09/2015  . History of CVA (cerebrovascular accident) 02/01/2015  . 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 ([place]   . 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 Chronic low back pain 01/28/2015  . Short-term memory loss 01/28/2015  . Volume depletion 01/28/2015  . Persistent atrial fibrillation 01/26/2015  . DCM (dilated cardiomyopathy) ([place] 01/26/2015  . TIA (transient ischemic attack) 01/24/2015  . Chronic diastolic (congestive) heart failure ([place]   . Obesity (BMI 30-39.9) 01/27/2013  . Constipation 09/02/2012  . H/O arthroscopy of right knee 08/06/2012  .  Anxiety   . Depression   . GERD (gastroesophageal reflux disease)   . Hypertensive heart  disease   . OSA (obstructive sleep apnea)   . Osteoarthritis of right knee   . Hyperparathyroidism, primary ([place]) 04/22/2012  . Chronic cough 10/02/2011    Farley Ly PT, MPT 03/01/2020, 1:56 PM  [facility] Physical Therapy [address] [place], [place], [address] Phone: [phone]   Fax:  [phone]  Name: Susan [name] MRN: [id number] Date of Birth: 08-11-1940

## 2020-03-03 ENCOUNTER — Other Ambulatory Visit: Payer: Self-pay

## 2020-03-03 ENCOUNTER — Encounter: Payer: Self-pay | Admitting: Rehabilitative and Restorative Service Providers"

## 2020-03-03 ENCOUNTER — Ambulatory Visit (INDEPENDENT_AMBULATORY_CARE_PROVIDER_SITE_OTHER): Payer: Medicare Other | Admitting: Rehabilitative and Restorative Service Providers"

## 2020-03-03 DIAGNOSIS — R262 Difficulty in walking, not elsewhere classified: Secondary | ICD-10-CM

## 2020-03-03 DIAGNOSIS — G8929 Other chronic pain: Secondary | ICD-10-CM

## 2020-03-03 DIAGNOSIS — M6281 Muscle weakness (generalized): Secondary | ICD-10-CM | POA: Diagnosis not present

## 2020-03-03 DIAGNOSIS — M25562 Pain in left knee: Secondary | ICD-10-CM | POA: Diagnosis not present

## 2020-03-03 DIAGNOSIS — M25662 Stiffness of left knee, not elsewhere classified: Secondary | ICD-10-CM

## 2020-03-03 DIAGNOSIS — R600 Localized edema: Secondary | ICD-10-CM

## 2020-03-03 NOTE — Therapy (Signed)
[facility] Physical Therapy [address] [place], [place], [address] Phone: [phone]   Fax:  [phone]  Physical Therapy Treatment  Patient Details  Name: Susan [name] MRN: [id number] Date of Birth: 09-28-1940 Referring Provider (PT): Leandrew Koyanagi, MD   Encounter Date: 03/03/2020   PT End of Session - 03/03/20 1143    Visit Number 7    Number of Visits 13    Date for PT Re-Evaluation 03/24/20    Authorization Type UHC    Authorization Time Period $30 copay    Progress Note Due on Visit 10    PT Start Time 1100    PT Stop Time 1147    PT Time Calculation (min) 47 min    Equipment Utilized During Treatment Gait belt    Activity Tolerance Patient tolerated treatment well;Patient limited by pain    Behavior During Therapy [facility] for tasks assessed/performed           Past Medical History:  Diagnosis Date  . Anxiety   . Arthritis    "legs, back" (07/29/2012)  . Cellulitis and abscess of leg 12/29/2017  . Chronic diastolic (congestive) heart failure ([place])   . Chronic lower back pain   . Complication of anesthesia    "I have apnea" (07/29/2012)  . Dementia ([place]) 03/12/2018  . 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 Family history of anesthesia complication    "some wake up during OR; some are hard to wake up; some both" (07/29/2012)  . GERD (gastroesophageal reflux disease)   . 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 ([place])   . History of CVA (cerebrovascular accident) 02/01/2015  . History of stomach ulcers 1980's  . Hyperlipidemia   . Hyperparathyroidism, primary ([place]) 04/22/2012  . Hypertensive heart disease   . Incontinent of urine    wears pads  . Major depressive disorder, recurrent episode, severe ([place]) 11/21/2015  . OSA on CPAP   . Osteoarthritis of right knee   . Pedal edema   . Persistent atrial fibrillation (HCC)    CHADS2VASC score is 7 - on chronic anticoagulation with Apixaban  . Spinal stenosis   .  Umbilical hernia    "unrepaired" (07/29/2012)  . Varicose veins    "BLE" (07/29/2012)  . Vascular dementia ([place])   . Venous stasis dermatitis of left lower extremity 04/29/2018    Past Surgical History:  Procedure Laterality Date  . CATARACT EXTRACTION    . COLONOSCOPY    . ENDOVENOUS ABLATION SAPHENOUS VEIN W/ LASER Left 06/24/2019   endovenous laser ablation left greater saphenous vein by Gae Gallop MD   . IUD REMOVAL  [phone]  . PARATHYROIDECTOMY N/A 06/16/2012   Procedure: NECK EXPLORATION AND LEFT SUPERIOR PARATHYROIDECTOMY;  Surgeon: Earnstine Regal, MD;  [facility];  Service: General;  Laterality: N/A;  . 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 TOTAL KNEE ARTHROPLASTY Right 07/29/2012   Procedure: TOTAL KNEE ARTHROPLASTY;  Surgeon: Ninetta Lights, MD;  Location: [place];  Service: Orthopedics;  Laterality: Right;  . TOTAL KNEE ARTHROPLASTY Left 12/13/2019   Procedure: LEFT TOTAL KNEE ARTHROPLASTY;  Surgeon: Leandrew Koyanagi, MD;  Location: [place];  Service: Orthopedics;  Laterality: Left;    There were no vitals filed for this visit.   Subjective Assessment - 03/03/20 1110    Subjective Susan [name] reports noticing more movement in her L knee this week.    Pertinent History OA, CHF, vascular dementia, CVA, CAD, persistent A-Fib, spinal  stenosis, right knee replacement ~38yr ago    Limitations Standing;Walking    How long can you stand comfortably? 5 minutes    How long can you walk comfortably? 5 minutes    Patient Stated Goals to walk comfortably and get out chair.    Currently in Pain? Yes    Pain Score 3     Pain Location Knee    Pain Descriptors / Indicators Aching;Tightness    Pain Type Chronic pain;Surgical pain    Pain Onset More than a month ago    Pain Frequency Intermittent    Aggravating Factors  Too much WB and end range flexion.    Pain Relieving Factors Change of position.    Effect of Pain on Daily Activities Uses a walker.   Stiffness with prolonged postures.     Multiple Pain Sites No    Pain Onset More than a month ago                             [facility]      Neuro Re-ed    Neuro Re-ed Details  Heel to toe balance 5X 20 seconds      Exercises   Exercises Knee/Hip      Knee/Hip Exercises: Aerobic   Recumbent Bike Seat 5 for 8 minutes emphasis on extension reach and flexion AAROM for 8 minutes      Knee/Hip Exercises: Machines for Strengthening   Total Gym Leg Press Leg press 87# 2 sets of 10 slow eccentrics double leg and 50# single leg 10X  (L only) slow eccentrics      Knee/Hip Exercises: Supine   Quad Sets --    Quad Sets Limitations --    Heel Slides --    Heel Slides Limitations --                  PT Education - 03/03/20 1140    Education Details Reviewed clinic program and reinforced 100 quadriceps sets and 2-3X/Day knee flexion AAROM at home.    Person(s) Educated Patient    Methods Explanation;Demonstration;Verbal cues    Comprehension Returned demonstration;Verbal cues required;Verbalized understanding;Need further instruction            PT Short Term Goals - 03/03/20 1141      PT SHORT TERM GOAL #1   Title Patient demonstrates understanding of initial HEP.    Time 2    Period Weeks    Status Achieved    Target Date 02/25/20             PT Long Term Goals - 03/03/20 1141      PT LONG TERM GOAL #1   Title Patient FOTO score improves to 53%    Time 6    Period Weeks    Status On-going      PT LONG TERM GOAL #2   Title Patient left knee PROM -5* extension to 100* flexion    Baseline -10 extension and 98 flexion on 02/24/2020    Time 6    Period Weeks    Status Partially Met      PT LONG TERM GOAL #3   Title Patient reports left knee pain at rest </= 2/10 and increases <2 increments with standing & gait activities.    Time 6    Period Weeks    Status On-going      PT LONG TERM GOAL #4  Title Patient ambulates 300' with  rollator walker & negotiates ramps / curbs modified independent.    Time 6    Period Weeks    Status On-going                 Plan - 03/03/20 1143    Clinical Impression Statement Susan [name] presents with significantly less edema and what looks like much better extension AROM.  Quadriceps strength, balance and gait with a less restrictive assistive device are the focus for next week.  RA next week as well.    Personal Factors and Comorbidities Age;Comorbidity 3+;Fitness    Comorbidities OA, CHF, vascular dementia, CVA, CAD, persistent A-Fib, spinal stenosis, right knee replacement ~48yr ago    Examination-Activity Limitations Locomotion Level;Squat;Stairs;Stand;Transfers    Examination-Participation Restrictions Community Activity    Stability/Clinical Decision Making Stable/Uncomplicated    Rehab Potential Good    PT Frequency 2x / week    PT Duration 6 weeks    PT Treatment/Interventions ADLs/Self Care Home Management;Cryotherapy;Electrical Stimulation;Canalith Repostioning;DME Instruction;Gait training;Stair training;Functional mobility training;Therapeutic activities;Therapeutic exercise;Balance training;Neuromuscular re-education;Patient/family education;Manual techniques;Scar mobilization;Passive range of motion;Taping;Joint Manipulations;Vasopneumatic Device    PT Next Visit Plan AROM, quadriceps strength and balance activities    PT Home Exercise Plan Access Code: [email or web address]   Consulted and Agree with Plan of Care Patient           Patient will benefit from skilled therapeutic intervention in order to improve the following deficits and impairments:  Abnormal gait,Decreased activity tolerance,Decreased balance,Decreased knowledge of use of DME,Decreased mobility,Decreased range of motion,Decreased strength,Increased edema,Impaired flexibility,Postural dysfunction,Obesity  Visit Diagnosis: Difficulty walking  Muscle weakness (generalized)  Stiffness of left knee, not elsewhere  classified  Chronic pain of left knee  Localized edema     Problem List Patient Active Problem List   Diagnosis Date Noted  . Acute pain of left knee 03/01/2020  . Hx of total knee replacement, left 02/01/2020  . 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  . 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 Anemia of chronic disease 09/07/2018  . Primary osteoarthritis of left knee 06/16/2018  . Venous stasis dermatitis of left lower extremity 04/29/2018  . Dementia ([place] 03/12/2018  . Recurrent cellulitis 02/12/2018  . HTN (hypertension) 02/12/2018  . Cellulitis and abscess of leg 12/29/2017  . CKD (chronic kidney disease) stage 3, GFR 30-59 ml/min (HCC) 12/29/2017  . Cellulitis of left lower extremity   . Ankle edema   . Sepsis ([place] 12/21/2017  . Medication management 10/11/2016  . Hyperlipemia 06/18/2016  . Gait abnormality 06/18/2016  . Major depressive disorder, recurrent episode, severe ([place] 11/21/2015  . Edema 02/09/2015  . History of CVA (cerebrovascular accident) 02/01/2015  . 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 ([place]   . 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 Chronic low back pain 01/28/2015  . Short-term memory loss 01/28/2015  . Volume depletion 01/28/2015  . Persistent atrial fibrillation 01/26/2015  . DCM (dilated cardiomyopathy) ([place] 01/26/2015  . TIA (transient ischemic attack) 01/24/2015  . Chronic diastolic (congestive) heart failure ([place]   . Obesity (BMI 30-39.9) 01/27/2013  . Constipation 09/02/2012  . H/O arthroscopy of right knee 08/06/2012  . Anxiety   . Depression   . GERD (gastroesophageal reflux disease)   . Hypertensive heart disease   . OSA (obstructive sleep apnea)   . Osteoarthritis of right knee   . Hyperparathyroidism, primary ([place] 04/22/2012  . Chronic cough 10/02/2011    Rob  Encarnacion Slates PT, MPT 03/03/2020, 11:46 AM  [facility] Physical Therapy [address] [place], [place], [address] Phone: [phone]   Fax:  [phone]  Name: Susan [name] MRN: [id number] Date of Birth: [birth year]

## 2020-03-07 ENCOUNTER — Encounter: Payer: Self-pay | Admitting: Orthopaedic Surgery

## 2020-03-07 ENCOUNTER — Ambulatory Visit (INDEPENDENT_AMBULATORY_CARE_PROVIDER_SITE_OTHER): Payer: Medicare Other

## 2020-03-07 ENCOUNTER — Ambulatory Visit (INDEPENDENT_AMBULATORY_CARE_PROVIDER_SITE_OTHER): Payer: Medicare Other | Admitting: Orthopaedic Surgery

## 2020-03-07 ENCOUNTER — Other Ambulatory Visit: Payer: Self-pay

## 2020-03-07 DIAGNOSIS — Z96652 Presence of left artificial knee joint: Secondary | ICD-10-CM

## 2020-03-07 NOTE — Progress Notes (Signed)
Post-Op Visit Note   Patient: Susan [name]           Date of Birth: 1940-09-23           MRN: [id number] Visit Date: 03/07/2020 PCP: Sonia Side., FNP   Assessment & Plan:  Chief Complaint:  Chief Complaint  Patient presents with  . Left Knee - Pain   Visit Diagnoses:  1. Hx of total knee replacement, left     Plan: Patient is a pleasant 80 year old female who comes in today 12 weeks out left total knee replacement.  She has been doing well.  She has 1 physical therapy session left.  She has been working on a home exercise program as well.  She is ambulating with a walker which is what she used prior to total knee replacement surgery.  She does note some discomfort going from a seated to standing position but is typically able to walk this out.  Left knee exam shows range of motion from about 0 to 100 degrees.  She is stable valgus varus stress.  She is neurovascular intact distally.  At this point, she will continue with physical therapy.  Continue with her home exercise program.  Dental prophylaxis reinforced.  Follow-up with [place] in 12 weeks time for recheck.  Call with concerns or questions in meantime.  Follow-Up Instructions: Return in about 3 months (around 06/07/2020).   Orders:  Orders Placed This Encounter  Procedures  . XR Knee 1-2 Views Left   No orders of the defined types were placed in this encounter.   Imaging: XR Knee 1-2 Views Left  Result Date: 03/07/2020 Well-seated prosthesis without complication   PMFS History: Patient Active Problem List   Diagnosis Date Noted  . Acute pain of left knee 03/01/2020  . Hx of total knee replacement, left 02/01/2020  . 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  . 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 Anemia of chronic disease 09/07/2018  . Primary osteoarthritis of left knee 06/16/2018  . Venous stasis dermatitis of left lower extremity 04/29/2018  . Dementia ([place]) 03/12/2018   . Recurrent cellulitis 02/12/2018  . HTN (hypertension) 02/12/2018  . Cellulitis and abscess of leg 12/29/2017  . CKD (chronic kidney disease) stage 3, GFR 30-59 ml/min (HCC) 12/29/2017  . Cellulitis of left lower extremity   . Ankle edema   . Sepsis ([place]) 12/21/2017  . Medication management 10/11/2016  . Hyperlipemia 06/18/2016  . Gait abnormality 06/18/2016  . Major depressive disorder, recurrent episode, severe ([place]) 11/21/2015  . Edema 02/09/2015  . History of CVA (cerebrovascular accident) 02/01/2015  . 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 ([place])   . 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 Chronic low back pain 01/28/2015  . Short-term memory loss 01/28/2015  . Volume depletion 01/28/2015  . Persistent atrial fibrillation 01/26/2015  . DCM (dilated cardiomyopathy) ([place]) 01/26/2015  . TIA (transient ischemic attack) 01/24/2015  . Chronic diastolic (congestive) heart failure ([place])   . Obesity (BMI 30-39.9) 01/27/2013  . Constipation 09/02/2012  . H/O arthroscopy of right knee 08/06/2012  . Anxiety   . Depression   . GERD (gastroesophageal reflux disease)   . Hypertensive heart disease   . OSA (obstructive sleep apnea)   . Osteoarthritis of right knee   . Hyperparathyroidism, primary ([place]) 04/22/2012  . Chronic cough 10/02/2011   Past Medical History:  Diagnosis Date  . Anxiety   . Arthritis    "  legs, back" (07/29/2012)  . Cellulitis and abscess of leg 12/29/2017  . Chronic diastolic (congestive) heart failure ([place])   . Chronic lower back pain   . Complication of anesthesia    "I have apnea" (07/29/2012)  . Dementia ([place]) 03/12/2018  . 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 Family history of anesthesia complication    "some wake up during OR; some are hard to wake up; some both" (07/29/2012)  . GERD (gastroesophageal reflux disease)   . 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 ([place])   . History of  CVA (cerebrovascular accident) 02/01/2015  . History of stomach ulcers 1980's  . Hyperlipidemia   . Hyperparathyroidism, primary ([place]) 04/22/2012  . Hypertensive heart disease   . Incontinent of urine    wears pads  . Major depressive disorder, recurrent episode, severe ([place]) 11/21/2015  . OSA on CPAP   . Osteoarthritis of right knee   . Pedal edema   . Persistent atrial fibrillation (HCC)    CHADS2VASC score is 7 - on chronic anticoagulation with Apixaban  . Spinal stenosis   . Umbilical hernia    "unrepaired" (07/29/2012)  . Varicose veins    "BLE" (07/29/2012)  . Vascular dementia ([place])   . Venous stasis dermatitis of left lower extremity 04/29/2018    Family History  Problem Relation Age of Onset  . Hypertension Mother        deceased  . Stroke Mother   . Breast cancer Mother   . Lung cancer Father        deceased  . Diabetes Daughter   . Hypertension Daughter     Past Surgical History:  Procedure Laterality Date  . CATARACT EXTRACTION    . COLONOSCOPY    . ENDOVENOUS ABLATION SAPHENOUS VEIN W/ LASER Left 06/24/2019   endovenous laser ablation left greater saphenous vein by Gae Gallop MD   . IUD REMOVAL  [phone]  . PARATHYROIDECTOMY N/A 06/16/2012   Procedure: NECK EXPLORATION AND LEFT SUPERIOR PARATHYROIDECTOMY;  Surgeon: Earnstine Regal, MD;  Location: [facility];  Service: General;  Laterality: N/A;  . 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 TOTAL KNEE ARTHROPLASTY Right 07/29/2012   Procedure: TOTAL KNEE ARTHROPLASTY;  Surgeon: Ninetta Lights, MD;  Location: [facility];  Service: Orthopedics;  Laterality: Right;  . TOTAL KNEE ARTHROPLASTY Left 12/13/2019   Procedure: LEFT TOTAL KNEE ARTHROPLASTY;  Surgeon: Leandrew Koyanagi, MD;  Location: [facility];  Service: Orthopedics;  Laterality: Left;   Social History   Occupational History  . Occupation: retired    Fish farm manager: RETIRED  Tobacco Use  . Smoking status: Former Smoker    Packs/day: 1.00    Years: 30.00    Pack years: 30.00     Types: Cigarettes    Quit date: 01/07/1978    Years since quitting: 42.1  . Smokeless tobacco: Never Used  Vaping Use  . Vaping Use: Never used  Substance and Sexual Activity  . Alcohol use: No  . Drug use: No  . Sexual activity: Not Currently    Comment: intercourse age 63,less than 5 secxual partners,

## 2020-03-08 ENCOUNTER — Ambulatory Visit (INDEPENDENT_AMBULATORY_CARE_PROVIDER_SITE_OTHER): Payer: Medicare Other | Admitting: Rehabilitative and Restorative Service Providers"

## 2020-03-08 ENCOUNTER — Encounter: Payer: Self-pay | Admitting: Rehabilitative and Restorative Service Providers"

## 2020-03-08 DIAGNOSIS — M25562 Pain in left knee: Secondary | ICD-10-CM

## 2020-03-08 DIAGNOSIS — R262 Difficulty in walking, not elsewhere classified: Secondary | ICD-10-CM | POA: Diagnosis not present

## 2020-03-08 DIAGNOSIS — M6281 Muscle weakness (generalized): Secondary | ICD-10-CM

## 2020-03-08 DIAGNOSIS — R600 Localized edema: Secondary | ICD-10-CM

## 2020-03-08 DIAGNOSIS — M25662 Stiffness of left knee, not elsewhere classified: Secondary | ICD-10-CM

## 2020-03-08 DIAGNOSIS — G8929 Other chronic pain: Secondary | ICD-10-CM

## 2020-03-08 NOTE — Therapy (Signed)
[facility] [address] [place], [place], [address] Phone: [phone]   Fax:  [phone]  Physical Therapy Treatment  Patient Details  Name: Susan [name] MRN: [id number] Date of Birth: 02-Jun-1940 Referring Provider (PT): Leandrew Koyanagi, MD   Encounter Date: 03/08/2020   PT End of Session - 03/08/20 1751    Visit Number 8    Number of Visits 13    Date for PT Re-Evaluation 03/24/20    Authorization Type U[employer]    Authorization Time Period $30 copay    Progress Note Due on Visit 18    PT Start Time 1305    PT Stop Time 1354    PT Time Calculation (min) 49 min    Equipment Utilized During Treatment Gait belt    Activity Tolerance Patient tolerated treatment well;Patient limited by fatigue    Behavior During Therapy [facility] for tasks assessed/performed           Past Medical History:  Diagnosis Date  . Anxiety   . Arthritis    "legs, back" (07/29/2012)  . Cellulitis and abscess of leg 12/29/2017  . Chronic diastolic (congestive) heart failure ([place])   . Chronic lower back pain   . Complication of anesthesia    "I have apnea" (07/29/2012)  . Dementia ([place]) 03/12/2018  . 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 Family history of anesthesia complication    "some wake up during OR; some are hard to wake up; some both" (07/29/2012)  . GERD (gastroesophageal reflux disease)   . 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 ([place])   . History of CVA (cerebrovascular accident) 02/01/2015  . History of stomach ulcers 1980's  . Hyperlipidemia   . Hyperparathyroidism, primary ([place]) 04/22/2012  . Hypertensive heart disease   . Incontinent of urine    wears pads  . Major depressive disorder, recurrent episode, severe ([place]) 11/21/2015  . OSA on CPAP   . Osteoarthritis of right knee   . Pedal edema   . Persistent atrial fibrillation (HCC)    CHADS2VASC score is 7 - on chronic anticoagulation with Apixaban  . Spinal stenosis   .  Umbilical hernia    "unrepaired" (07/29/2012)  . Varicose veins    "BLE" (07/29/2012)  . Vascular dementia ([place])   . Venous stasis dermatitis of left lower extremity 04/29/2018    Past Surgical History:  Procedure Laterality Date  . CATARACT EXTRACTION    . COLONOSCOPY    . ENDOVENOUS ABLATION SAPHENOUS VEIN W/ LASER Left 06/24/2019   endovenous laser ablation left greater saphenous vein by Gae Gallop MD   . IUD REMOVAL  [phone]  . PARATHYROIDECTOMY N/A 06/16/2012   Procedure: NECK EXPLORATION AND LEFT SUPERIOR PARATHYROIDECTOMY;  Surgeon: Earnstine Regal, MD;  Location: [facility];  Service: General;  Laterality: N/A;  . 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 TOTAL KNEE ARTHROPLASTY Right 07/29/2012   Procedure: TOTAL KNEE ARTHROPLASTY;  Surgeon: Ninetta Lights, MD;  Location: [facility];  Service: Orthopedics;  Laterality: Right;  . TOTAL KNEE ARTHROPLASTY Left 12/13/2019   Procedure: LEFT TOTAL KNEE ARTHROPLASTY;  Surgeon: Leandrew Koyanagi, MD;  Location: [facility];  Service: Orthopedics;  Laterality: Left;    There were no vitals filed for this visit.   Subjective Assessment - 03/08/20 1333    Subjective Susan [name] is pleased with her early PT progress.  Edema is still limiting and she would like to try to transition from a walker to  a cane.    Pertinent History OA, CHF, vascular dementia, CVA, CAD, persistent A-Fib, spinal stenosis, right knee replacement ~33yr ago    Limitations Standing;Walking    How long can you stand comfortably? 5 minutes    How long can you walk comfortably? 5 minutes    Patient Stated Goals Straighten the knee, reduce swelling and walk with a cane (at least inside the home)    Currently in Pain? Yes    Pain Score 3     Pain Location Knee    Pain Orientation Left    Pain Descriptors / Indicators Aching;Tightness    Pain Type Chronic pain;Surgical pain    Pain Onset More than a month ago    Pain Frequency Intermittent    Aggravating Factors  Too much WB and end range  AROM    Pain Relieving Factors Change of position and exercises    Effect of Pain on Daily Activities Uses a walker due to instability and remaining leg weakness.    Multiple Pain Sites No    Pain Onset More than a month ago              [facility] - 03/08[facility]20 0001      Observation/Other Assessments   Focus on Therapeutic Outcomes (FOTO)  57, was 38      AROM   Left Knee Extension -5    Left Knee Flexion 106                         [facility] Adult PT Treatment/Exercise - 03/08/20 0001      Neuro Re-ed    Neuro Re-ed Details  Heel to toe balance 5X 20 seconds and dynamic walking with a single point cane 200 feet (cane in R hand)      Exercises   Exercises Knee/Hip      Knee/Hip Exercises: Aerobic   Recumbent Bike Seat 5 for 8 minutes emphasis on extension reach and flexion AAROM for 8 minutes      Knee/Hip Exercises: Machines for Strengthening   Total Gym Leg Press Leg press 87# 1 set of 10 slow eccentrics double leg and 50# single leg 10X  (L only) slow eccentrics                  PT Education - 03/08/20 1748    Education Details Reviewed exam findings and started gait with a cane.    Person(s) Educated Patient    Methods Explanation;Demonstration;Verbal cues;Tactile cues    Comprehension Verbalized understanding;Returned demonstration;Verbal cues required;Tactile cues required;Need further instruction            PT Short Term Goals - 03/08/20 1748      PT SHORT TERM GOAL #1   Title Patient demonstrates understanding of initial HEP.    Time 2    Period Weeks    Status Achieved    Target Date 02/25/20             PT Long Term Goals - 03/08/20 1749      PT LONG TERM GOAL #1   Title Patient FOTO score improves to 53%    Baseline 38 (Goal 53) 57 on 03/08/2020    Time 6    Period Weeks    Status Achieved      PT LONG TERM GOAL #2   Title Patient left knee PROM -5* extension to 100* flexion    Baseline Met    Time 6  Period Weeks    Status Achieved      PT LONG TERM GOAL #3   Title Patient reports left knee pain at rest </= 2/10 and increases <2 increments with standing & gait activities.    Baseline Can increase relatively quickly with WB due to quadriceps weakness.    Time 6    Period Weeks    Status On-going      PT LONG TERM GOAL #4   Title Patient ambulates 300' with rollator walker & negotiates ramps / curbs modified independent.    Baseline Distance yes, stairs and curbs need work.    Time 6    Period Weeks    Status On-going                 Plan - 03/08/20 1753    Clinical Impression Statement Susan [name] is making good progress towards long-term goals established at evaluation.  AROM goes up and down due to edema on the day edema is assessed.  Improved quadriceps strength should address this.  Susan [name] is still using a walker but would like to transition to a cane.  She was very slow and apprehensive with the cane today and because she lives alone, she will benefit from additional PT to address quadriceps trength (reduced edema and improved function) and safety with a less restrictive AD (cane).    Personal Factors and Comorbidities Age;Comorbidity 3+;Fitness    Comorbidities OA, CHF, vascular dementia, CVA, CAD, persistent A-Fib, spinal stenosis, right knee replacement ~23yr ago    Examination-Activity Limitations Locomotion Level;Squat;Stairs;Stand;Transfers    Examination-Participation Restrictions Community Activity    Stability/Clinical Decision Making Stable/Uncomplicated    Rehab Potential Good    PT Frequency 2x / week    PT Duration Other (comment)   5 weeks (10 visits if needed)   PT Treatment/Interventions ADLs/Self Care Home Management;Cryotherapy;Electrical Stimulation;DME Instruction;Gait training;Stair training;Functional mobility training;Therapeutic activities;Therapeutic exercise;Balance training;Neuromuscular re-education;Patient/family education;Manual techniques;Scar  mobilization;Passive range of motion;Joint Manipulations;Vasopneumatic Device    PT Next Visit Plan AROM, quadriceps strength, stairs, proper cane use and balance activities    PT Home Exercise Plan Access Code: [email or web address]   Consulted and Agree with Plan of Care Patient           Patient will benefit from skilled therapeutic intervention in order to improve the following deficits and impairments:  Abnormal gait,Decreased activity tolerance,Decreased balance,Decreased knowledge of use of DME,Decreased mobility,Decreased range of motion,Decreased strength,Increased edema,Impaired flexibility,Postural dysfunction,Obesity  Visit Diagnosis: Difficulty walking  Muscle weakness (generalized)  Stiffness of left knee, not elsewhere classified  Chronic pain of left knee  Localized edema     Problem List Patient Active Problem List   Diagnosis Date Noted  . Acute pain of left knee 03/01/2020  . Hx of total knee replacement, left 02/01/2020  . 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  . 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 Anemia of chronic disease 09/07/2018  . Primary osteoarthritis of left knee 06/16/2018  . Venous stasis dermatitis of left lower extremity 04/29/2018  . Dementia ([place] 03/12/2018  . Recurrent cellulitis 02/12/2018  . HTN (hypertension) 02/12/2018  . Cellulitis and abscess of leg 12/29/2017  . CKD (chronic kidney disease) stage 3, GFR 30-59 ml/min (HCC) 12/29/2017  . Cellulitis of left lower extremity   . Ankle edema   . Sepsis ([place] 12/21/2017  . Medication management 10/11/2016  . Hyperlipemia 06/18/2016  . Gait abnormality 06/18/2016  . Major depressive disorder, recurrent episode, severe ([place] 11/21/2015  .  Edema 02/09/2015  . History of CVA (cerebrovascular accident) 02/01/2015  . 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 ([place])   . 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 Chronic low  back pain 01/28/2015  . Short-term memory loss 01/28/2015  . Volume depletion 01/28/2015  . Persistent atrial fibrillation 01/26/2015  . DCM (dilated cardiomyopathy) ([place]) 01/26/2015  . TIA (transient ischemic attack) 01/24/2015  . Chronic diastolic (congestive) heart failure ([place])   . Obesity (BMI 30-39.9) 01/27/2013  . Constipation 09/02/2012  . H/O arthroscopy of right knee 08/06/2012  . Anxiety   . Depression   . GERD (gastroesophageal reflux disease)   . Hypertensive heart disease   . OSA (obstructive sleep apnea)   . Osteoarthritis of right knee   . Hyperparathyroidism, primary ([place]) 04/22/2012  . Chronic cough 10/02/2011    Farley Ly PT, MPT 03/08/2020, 5:58 PM  [facility] [address] [place], [place], [address] Phone: [phone]   Fax:  [phone]  Name: Susan [name] MRN: [id number] Date of Birth: 12/25/1940

## 2020-03-10 ENCOUNTER — Ambulatory Visit: Payer: Self-pay | Admitting: Podiatry

## 2020-03-10 ENCOUNTER — Ambulatory Visit (INDEPENDENT_AMBULATORY_CARE_PROVIDER_SITE_OTHER): Payer: Medicare Other | Admitting: Rehabilitative and Restorative Service Providers"

## 2020-03-10 ENCOUNTER — Encounter: Payer: Self-pay | Admitting: Rehabilitative and Restorative Service Providers"

## 2020-03-10 ENCOUNTER — Other Ambulatory Visit: Payer: Self-pay

## 2020-03-10 DIAGNOSIS — R262 Difficulty in walking, not elsewhere classified: Secondary | ICD-10-CM | POA: Diagnosis not present

## 2020-03-10 DIAGNOSIS — R600 Localized edema: Secondary | ICD-10-CM

## 2020-03-10 DIAGNOSIS — M6281 Muscle weakness (generalized): Secondary | ICD-10-CM

## 2020-03-10 DIAGNOSIS — M25662 Stiffness of left knee, not elsewhere classified: Secondary | ICD-10-CM | POA: Diagnosis not present

## 2020-03-10 DIAGNOSIS — M25562 Pain in left knee: Secondary | ICD-10-CM | POA: Diagnosis not present

## 2020-03-10 DIAGNOSIS — G8929 Other chronic pain: Secondary | ICD-10-CM

## 2020-03-10 NOTE — Therapy (Signed)
[facility] Physical Therapy [address] [place], [place], [address] Phone: [phone]   Fax:  [phone]  Physical Therapy Treatment  Patient Details  Name: Susan [name] MRN: [id number] Date of Birth: [birth year] Referring Provider (PT): Leandrew Koyanagi, MD   Encounter Date: 03/10/2020   PT End of Session - 03/10/20 1139    Visit Number 10    Number of Visits 13    Date for PT Re-Evaluation 03/24/20    Authorization Type UHC    Authorization Time Period $30 copay    Progress Note Due on Visit 18    PT Start Time 1048    PT Stop Time 1143    PT Time Calculation (min) 55 min    Equipment Utilized During Treatment Gait belt    Activity Tolerance Patient tolerated treatment well;Patient limited by fatigue    Behavior During Therapy [facility] for tasks assessed/performed           Past Medical History:  Diagnosis Date  . Anxiety   . Arthritis    "legs, back" (07/29/2012)  . Cellulitis and abscess of leg 12/29/2017  . Chronic diastolic (congestive) heart failure ([place])   . Chronic lower back pain   . Complication of anesthesia    "I have apnea" (07/29/2012)  . Dementia ([place]) 03/12/2018  . 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 Family history of anesthesia complication    "some wake up during OR; some are hard to wake up; some both" (07/29/2012)  . GERD (gastroesophageal reflux disease)   . 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 ([place])   . History of CVA (cerebrovascular accident) 02/01/2015  . History of stomach ulcers 1980's  . Hyperlipidemia   . Hyperparathyroidism, primary ([place]) 04/22/2012  . Hypertensive heart disease   . Incontinent of urine    wears pads  . Major depressive disorder, recurrent episode, severe ([place]) 11/21/2015  . OSA on CPAP   . Osteoarthritis of right knee   . Pedal edema   . Persistent atrial fibrillation (HCC)    CHADS2VASC score is 7 - on chronic anticoagulation with Apixaban  . Spinal stenosis    . Umbilical hernia    "unrepaired" (07/29/2012)  . Varicose veins    "BLE" (07/29/2012)  . Vascular dementia ([place])   . Venous stasis dermatitis of left lower extremity 04/29/2018    Past Surgical History:  Procedure Laterality Date  . CATARACT EXTRACTION    . COLONOSCOPY    . ENDOVENOUS ABLATION SAPHENOUS VEIN W/ LASER Left 06/24/2019   endovenous laser ablation left greater saphenous vein by Gae Gallop MD   . IUD REMOVAL  [phone]  . PARATHYROIDECTOMY N/A 06/16/2012   Procedure: NECK EXPLORATION AND LEFT SUPERIOR PARATHYROIDECTOMY;  Surgeon: Earnstine Regal, MD;  [facility]: [facility];  Service: General;  Laterality: N/A;  . 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 TOTAL KNEE ARTHROPLASTY Right 07/29/2012   Procedure: TOTAL KNEE ARTHROPLASTY;  Surgeon: Ninetta Lights, MD;  Location: [place];  Service: Orthopedics;  Laterality: Right;  . TOTAL KNEE ARTHROPLASTY Left 12/13/2019   Procedure: LEFT TOTAL KNEE ARTHROPLASTY;  Surgeon: Leandrew Koyanagi, MD;  Location: [place];  Service: Orthopedics;  Laterality: Left;    There were no vitals filed for this visit.   Subjective Assessment - 03/10/20 1136    Subjective [name] notes edema is better on some days than others.  She would like to get stronger, work on being more efficient with her gait  and practice walking with a cane.    Pertinent History OA, CHF, vascular dementia, CVA, CAD, persistent A-Fib, spinal stenosis, right knee replacement ~74yr ago    Limitations Standing;Walking    How long can you stand comfortably? 5 minutes    How long can you walk comfortably? 5 minutes    Patient Stated Goals Straighten the knee, reduce swelling and walk with a cane (at least inside the home)    Currently in Pain? No/denies    Pain Onset More than a month ago    Pain Onset More than a month ago                             [facility] 03/10/20 0001      Therapeutic Activites    Therapeutic Activities Other  Therapeutic Activities    Other Therapeutic Activities Taking long strides with the cane, both hands on parallel bars and one hand (R side) in parallel bars, step up and over with one hand (R) in parallel bars (2, 4 and 6 inch step up and over)      Neuro Re-ed    Neuro Re-ed Details  --      Exercises   Exercises Knee/Hip      Knee/Hip Exercises: Aerobic   Recumbent Bike Seat 5 for 8 minutes emphasis on extension reach and flexion AAROM for 8 minutes      Knee/Hip Exercises: Machines for Strengthening   Total Gym Leg Press Leg press 87# & 100# 1 set of 10 each slow eccentrics double leg and 50# single leg (L only) 10X slow eccentrics                  PT Education - 03/10/20 1137    Education Details Reviewed emphasis on quadriceps sets and knee flexion AROM at home.  Also encouraged long strides with the walker.    Person(s) Educated Patient    Methods Explanation;Demonstration;Verbal cues    Comprehension Verbalized understanding;Need further instruction;Returned demonstration;Verbal cues required            PT Short Term Goals - 03/10/20 1138      PT SHORT TERM GOAL #1   Title Patient demonstrates understanding of initial HEP.    Time 2    Period Weeks    Status Achieved    Target Date 02/25/20             PT Long Term Goals - 03/10/20 1138      PT LONG TERM GOAL #1   Title Patient FOTO score improves to 53%    Baseline 38 (Goal 53) 57 on 03/08/2020    Time 6    Period Weeks    Status Achieved      PT LONG TERM GOAL #2   Title Patient left knee PROM -5* extension to 100* flexion    Baseline Met    Time 6    Period Weeks    Status Achieved      PT LONG TERM GOAL #3   Title Patient reports left knee pain at rest </= 2/10 and increases <2 increments with standing & gait activities.    Baseline Can increase relatively quickly with WB due to quadriceps weakness.    Time 6    Period Weeks    Status On-going      PT LONG TERM GOAL #4   Title Patient  ambulates 300' with rollator walker &  negotiates ramps / curbs modified independent.    Baseline Distance yes, stairs and curbs need work.    Time 6    Period Weeks    Status On-going                 Plan - 03/10/20 1140    Clinical Impression Statement Quadriceps strength, edema, AROM, balance, gait quality and endurance will benefit from continued work.  Chanda does much better with her gait with work in the parallel bars due to apprehension with the cane.  She can be less reliant on an assistive device (cane vs walker) with continued practice and improved quadriceps strength.  This will help make her more independent as she lives alone.    Personal Factors and Comorbidities Age;Comorbidity 3+;Fitness    Comorbidities OA, CHF, vascular dementia, CVA, CAD, persistent A-Fib, spinal stenosis, right knee replacement ~70yr ago    Examination-Activity Limitations Locomotion Level;Squat;Stairs;Stand;Transfers    Examination-Participation Restrictions Community Activity    Stability/Clinical Decision Making Stable/Uncomplicated    Rehab Potential Good    PT Frequency 2x / week    PT Duration Other (comment)   5 weeks (10 visits if needed)   PT Treatment/Interventions ADLs/Self Care Home Management;Cryotherapy;Electrical Stimulation;DME Instruction;Gait training;Stair training;Functional mobility training;Therapeutic activities;Therapeutic exercise;Balance training;Neuromuscular re-education;Patient/family education;Manual techniques;Scar mobilization;Passive range of motion;Joint Manipulations;Vasopneumatic Device    PT Next Visit Plan AROM, quadriceps strength, stairs, proper cane use and balance activities    PT Home Exercise Plan Access Code: [email or web address]   Consulted and Agree with Plan of Care Patient           Patient will benefit from skilled therapeutic intervention in order to improve the following deficits and impairments:  Abnormal gait,Decreased activity tolerance,Decreased  balance,Decreased knowledge of use of DME,Decreased mobility,Decreased range of motion,Decreased strength,Increased edema,Impaired flexibility,Postural dysfunction,Obesity  Visit Diagnosis: Difficulty walking  Muscle weakness (generalized)  Stiffness of left knee, not elsewhere classified  Chronic pain of left knee  Localized edema     Problem List Patient Active Problem List   Diagnosis Date Noted  . Acute pain of left knee 03/01/2020  . Hx of total knee replacement, left 02/01/2020  . 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  . 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 Anemia of chronic disease 09/07/2018  . Primary osteoarthritis of left knee 06/16/2018  . Venous stasis dermatitis of left lower extremity 04/29/2018  . Dementia ([place] 03/12/2018  . Recurrent cellulitis 02/12/2018  . HTN (hypertension) 02/12/2018  . Cellulitis and abscess of leg 12/29/2017  . CKD (chronic kidney disease) stage 3, GFR 30-59 ml/min (HCC) 12/29/2017  . Cellulitis of left lower extremity   . Ankle edema   . Sepsis ([place] 12/21/2017  . Medication management 10/11/2016  . Hyperlipemia 06/18/2016  . Gait abnormality 06/18/2016  . Major depressive disorder, recurrent episode, severe ([place] 11/21/2015  . Edema 02/09/2015  . History of CVA (cerebrovascular accident) 02/01/2015  . 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 ([place]   . 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 Chronic low back pain 01/28/2015  . Short-term memory loss 01/28/2015  . Volume depletion 01/28/2015  . Persistent atrial fibrillation 01/26/2015  . DCM (dilated cardiomyopathy) ([place] 01/26/2015  . TIA (transient ischemic attack) 01/24/2015  . Chronic diastolic (congestive) heart failure ([place]   . Obesity (BMI 30-39.9) 01/27/2013  . Constipation 09/02/2012  . H/O arthroscopy of right knee 08/06/2012  . Anxiety   . Depression   .  GERD (gastroesophageal reflux  disease)   . Hypertensive heart disease   . OSA (obstructive sleep apnea)   . Osteoarthritis of right knee   . Hyperparathyroidism, primary ([place]) 04/22/2012  . Chronic cough 10/02/2011    Farley Ly PT, MPT 03/10/2020, 11:43 AM  [facility] Physical Therapy [address] [place], [place], [address] Phone: [phone]   Fax:  [phone]  Name: Susan [name] MRN: [id number] Date of Birth: 12-26-1940

## 2020-03-17 ENCOUNTER — Ambulatory Visit: Payer: Self-pay | Admitting: Podiatry

## 2020-03-20 ENCOUNTER — Telehealth: Payer: Self-pay | Admitting: Rehabilitative and Restorative Service Providers"

## 2020-03-20 ENCOUNTER — Encounter: Payer: Medicare Other | Admitting: Rehabilitative and Restorative Service Providers"

## 2020-03-20 NOTE — Telephone Encounter (Signed)
I called Pt. About today's appointment after she wasn't here for 11:45.  She mentioned she called earlier and left a message stating she would not make it today.   Reminded of next appointment on 03/23/20. Scot Jun, PT, DPT, OCS, ATC 03/20/20  12:09 PM

## 2020-03-22 ENCOUNTER — Ambulatory Visit (INDEPENDENT_AMBULATORY_CARE_PROVIDER_SITE_OTHER): Payer: Medicare Other | Admitting: Podiatry

## 2020-03-22 ENCOUNTER — Other Ambulatory Visit: Payer: Self-pay

## 2020-03-22 DIAGNOSIS — M79674 Pain in right toe(s): Secondary | ICD-10-CM | POA: Diagnosis not present

## 2020-03-22 DIAGNOSIS — M79675 Pain in left toe(s): Secondary | ICD-10-CM

## 2020-03-22 DIAGNOSIS — B351 Tinea unguium: Secondary | ICD-10-CM

## 2020-03-23 ENCOUNTER — Encounter: Payer: Self-pay | Admitting: Rehabilitative and Restorative Service Providers"

## 2020-03-23 ENCOUNTER — Ambulatory Visit (INDEPENDENT_AMBULATORY_CARE_PROVIDER_SITE_OTHER): Payer: Medicare Other | Admitting: Rehabilitative and Restorative Service Providers"

## 2020-03-23 DIAGNOSIS — M6281 Muscle weakness (generalized): Secondary | ICD-10-CM

## 2020-03-23 DIAGNOSIS — M25662 Stiffness of left knee, not elsewhere classified: Secondary | ICD-10-CM | POA: Diagnosis not present

## 2020-03-23 DIAGNOSIS — R2689 Other abnormalities of gait and mobility: Secondary | ICD-10-CM | POA: Diagnosis not present

## 2020-03-23 DIAGNOSIS — M25562 Pain in left knee: Secondary | ICD-10-CM

## 2020-03-23 DIAGNOSIS — R600 Localized edema: Secondary | ICD-10-CM

## 2020-03-23 NOTE — Therapy (Addendum)
[facility] [place], [place], [address] Phone: [phone]   Fax:  [phone]  Physical Therapy Treatment/Discharge  Patient Details  Name: Susan [name] MRN: [id number] Date of Birth: [birth year] Referring Provider (PT): Leandrew Koyanagi, MD   Encounter Date: 03/23/2020   PT End of Session - 03/23/20 1513    Visit Number 11    Number of Visits 13    Date for PT Re-Evaluation 03/24/20    Authorization Type UHC    Authorization Time Period $30 copay    Progress Note Due on Visit 18    PT Start Time 1510    PT Stop Time 1550    PT Time Calculation (min) 40 min    Equipment Utilized During Treatment Gait belt;Other (comment)   rollator and quad cane   Activity Tolerance Patient tolerated treatment well    Behavior During Therapy WFL for tasks assessed/performed           Past Medical History:  Diagnosis Date  . Anxiety   . Arthritis    "legs, back" (07/29/2012)  . Cellulitis and abscess of leg 12/29/2017  . Chronic diastolic (congestive) heart failure ([place])   . Chronic lower back pain   . Complication of anesthesia    "I have apnea" (07/29/2012)  . Dementia ([place]) 03/12/2018  . 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 Family history of anesthesia complication    "some wake up during OR; some are hard to wake up; some both" (07/29/2012)  . GERD (gastroesophageal reflux disease)   . 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 ([place])   . History of CVA (cerebrovascular accident) 02/01/2015  . History of stomach ulcers 1980's  . Hyperlipidemia   . Hyperparathyroidism, primary ([place]) 04/22/2012  . Hypertensive heart disease   . Incontinent of urine    wears pads  . Major depressive disorder, recurrent episode, severe ([place]) 11/21/2015  . OSA on CPAP   . Osteoarthritis of right knee   . Pedal edema   . Persistent atrial fibrillation (HCC)    CHADS2VASC score is 7 - on chronic anticoagulation with Apixaban   . Spinal stenosis   . Umbilical hernia    "unrepaired" (07/29/2012)  . Varicose veins    "BLE" (07/29/2012)  . Vascular dementia ([place])   . Venous stasis dermatitis of left lower extremity 04/29/2018    Past Surgical History:  Procedure Laterality Date  . CATARACT EXTRACTION    . COLONOSCOPY    . ENDOVENOUS ABLATION SAPHENOUS VEIN W/ LASER Left 06/24/2019   endovenous laser ablation left greater saphenous vein by Gae Gallop MD   . IUD REMOVAL  [phone]  . PARATHYROIDECTOMY N/A 06/16/2012   Procedure: NECK EXPLORATION AND LEFT SUPERIOR PARATHYROIDECTOMY;  Surgeon: Earnstine Regal, MD;  [facility];  Service: General;  Laterality: N/A;  . 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 TOTAL KNEE ARTHROPLASTY Right 07/29/2012   Procedure: TOTAL KNEE ARTHROPLASTY;  Surgeon: Ninetta Lights, MD;  Location: [place];  Service: Orthopedics;  Laterality: Right;  . TOTAL KNEE ARTHROPLASTY Left 12/13/2019   Procedure: LEFT TOTAL KNEE ARTHROPLASTY;  Surgeon: Leandrew Koyanagi, MD;  Location: [place];  Service: Orthopedics;  Laterality: Left;    There were no vitals filed for this visit.   Subjective Assessment - 03/23/20 1513    Subjective Pt. arrived stating she thought today could be her last day because she felt like she could practice with cane and do  her exercises at home.  (Pt. arrived c rollator today).  Indicated pain c prolonged sitting 7/10 or so.    Pertinent History OA, CHF, vascular dementia, CVA, CAD, persistent A-Fib, spinal stenosis, right knee replacement ~86yr ago    Limitations Standing;Walking    How long can you stand comfortably? 5 minutes    How long can you walk comfortably? 5 minutes    Patient Stated Goals Straighten the knee, reduce swelling and walk with a cane (at least inside the home)    Currently in Pain? Yes    Pain Score 7    at worst   Pain Location Knee    Pain Orientation Left    Pain Descriptors / Indicators Aching;Tightness    Pain Type Chronic pain;Surgical pain     Pain Onset More than a month ago    Pain Frequency Intermittent    Aggravating Factors  sitting prolonged, getting leg straight    Pain Relieving Factors some movement    Pain Onset More than a month ago              [name]      Assessment   Medical Diagnosis left Total Knee Arthroplasty    Referring Provider (PT) NGeorga KaufmannMEphriam Jenkins MD    Onset Date/Surgical Date 12/13/19      Functional Tests   Functional tests Other      Other:   Other/ Comments TUG c rollator 25.1 seconds, TUG c quad cane Rt UE 49 seconds c CGA      Transfers   Sit to Stand 6: Modified independent (Device/Increase time);With upper extremity assist;Multiple attempts      Ambulation/Gait   Ambulation/Gait Yes    Ambulation/Gait Assistance 6: Modified independent (Device/Increase time)    Assistive device Rollator    Gait Pattern Decreased stance time - left;Decreased stride length;Decreased step length - left;Decreased step length - right;Antalgic;Trunk flexed;Wide base of support                         [facility] Adult PT Treatment/Exercise - 03/23/20 0001      Ambulation/Gait   Gait Comments Quad cane in Rt UE training c placement cues, sequencing, CGA for 50 ft x 4 trials.  Noted shuffling in gait c decreased toe clearing. TUG performed c quad cane and c rollator      Therapeutic Activites    Other Therapeutic Activities Ramp ascending/descending c rollator x 2 CGA, sit to stand x 5 transfer training for functional mobility improvements.      Knee/Hip Exercises: Aerobic   Nustep Lvl 5 10 mins      Knee/Hip Exercises: Machines for Strengthening   Total Gym Leg Press Leg press 87 lbs 2 x 10 DL eccentric lowering focus                  PT Education - 03/23/20 1552    Education Details Education on ramp navigation, education on fall risk, importance of continued in person balance training.    Person(s) Educated Patient    Methods  Explanation;Demonstration;Verbal cues;Tactile cues    Comprehension Verbal cues required;Returned demonstration;Verbalized understanding            PT Short Term Goals - 03/10/20 1138      PT SHORT TERM GOAL #1   Title Patient demonstrates understanding of initial HEP.    Time 2    Period Weeks    Status Achieved  Target Date 02/25/20             PT Long Term Goals - 03/10/20 1138      PT LONG TERM GOAL #1   Title Patient FOTO score improves to 53%    Baseline 38 (Goal 53) 57 on 03/08/2020    Time 6    Period Weeks    Status Achieved      PT LONG TERM GOAL #2   Title Patient left knee PROM -5* extension to 100* flexion    Baseline Met    Time 6    Period Weeks    Status Achieved      PT LONG TERM GOAL #3   Title Patient reports left knee pain at rest </= 2/10 and increases <2 increments with standing & gait activities.    Baseline Can increase relatively quickly with WB due to quadriceps weakness.    Time 6    Period Weeks    Status On-going      PT LONG TERM GOAL #4   Title Patient ambulates 300' with rollator walker & negotiates ramps / curbs modified independent.    Baseline Distance yes, stairs and curbs need work.    Time 6    Period Weeks    Status On-going                 Plan - 03/23/20 1552    Clinical Impression Statement Extended discussion today regarding current presentation and whether continued treatment in clinic was warranted.  Pt. expressed desire to improve to quad cane use.  Current presentation showed Pt. was not safe without clinician assistance/cueing in addition to noted increased fall risk as observed in inability to perform sit to stand s UE, TUG score.  Pt. was in agreement and would like to continue to gain in clinic some more.    Personal Factors and Comorbidities Age;Comorbidity 3+;Fitness    Comorbidities OA, CHF, vascular dementia, CVA, CAD, persistent A-Fib, spinal stenosis, right knee replacement ~50yr ago     Examination-Activity Limitations Locomotion Level;Squat;Stairs;Stand;Transfers    Examination-Participation Restrictions Community Activity    Stability/Clinical Decision Making Stable/Uncomplicated    Rehab Potential Good    PT Frequency 2x / week    PT Duration Other (comment)   5 weeks (10 visits if needed)   PT Treatment/Interventions ADLs/Self Care Home Management;Cryotherapy;Electrical Stimulation;DME Instruction;Gait training;Stair training;Functional mobility training;Therapeutic activities;Therapeutic exercise;Balance training;Neuromuscular re-education;Patient/family education;Manual techniques;Scar mobilization;Passive range of motion;Joint Manipulations;Vasopneumatic Device    PT Next Visit Plan Continue with strnegthening that she can't perform at home, static and dynamic balance improvements, SPC training.  Official Recert next visit    PT Home Exercise Plan Access Code: [email or web address]   Consulted and Agree with Plan of Care Patient           Patient will benefit from skilled therapeutic intervention in order to improve the following deficits and impairments:  Abnormal gait,Decreased activity tolerance,Decreased balance,Decreased knowledge of use of DME,Decreased mobility,Decreased range of motion,Decreased strength,Increased edema,Impaired flexibility,Postural dysfunction,Obesity  Visit Diagnosis: Muscle weakness (generalized)  Acute pain of left knee  Other abnormalities of gait and mobility  Stiffness of left knee, not elsewhere classified  Localized edema     Problem List Patient Active Problem List   Diagnosis Date Noted  . Acute pain of left knee 03/01/2020  . Hx of total knee replacement, left 02/01/2020  . 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  . 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 Anemia of  chronic disease 09/07/2018  . Primary osteoarthritis of left knee 06/16/2018  . Venous stasis dermatitis of left lower  extremity 04/29/2018  . Dementia ([place]) 03/12/2018  . Recurrent cellulitis 02/12/2018  . HTN (hypertension) 02/12/2018  . Cellulitis and abscess of leg 12/29/2017  . CKD (chronic kidney disease) stage 3, GFR 30-59 ml/min (HCC) 12/29/2017  . Cellulitis of left lower extremity   . Ankle edema   . Sepsis ([place]) 12/21/2017  . Medication management 10/11/2016  . Hyperlipemia 06/18/2016  . Gait abnormality 06/18/2016  . Major depressive disorder, recurrent episode, severe ([place]) 11/21/2015  . Edema 02/09/2015  . History of CVA (cerebrovascular accident) 02/01/2015  . 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 ([place])   . 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 Chronic low back pain 01/28/2015  . Short-term memory loss 01/28/2015  . Volume depletion 01/28/2015  . Persistent atrial fibrillation 01/26/2015  . DCM (dilated cardiomyopathy) ([place]) 01/26/2015  . TIA (transient ischemic attack) 01/24/2015  . Chronic diastolic (congestive) heart failure ([place])   . Obesity (BMI 30-39.9) 01/27/2013  . Constipation 09/02/2012  . H/O arthroscopy of right knee 08/06/2012  . Anxiety   . Depression   . GERD (gastroesophageal reflux disease)   . Hypertensive heart disease   . OSA (obstructive sleep apnea)   . Osteoarthritis of right knee   . Hyperparathyroidism, primary ([place]) 04/22/2012  . Chronic cough 10/02/2011    Scot Jun, PT, DPT, OCS, ATC 03/23/20  3:56 PM    PHYSICAL THERAPY DISCHARGE SUMMARY  Visits from Start of Care: 11  Current functional level related to goals / functional outcomes: See note   Remaining deficits: See note   Education / Equipment: HEP Plan: Patient agrees to discharge.  Patient goals were partially met. Patient is being discharged due to not returning since the last visit.  ?????    Scot Jun, PT, DPT, OCS, ATC 05/05/20  8:53 AM       [facility] Physical Therapy [address] [place], [place],  [address] Phone: [phone]   Fax:  [phone]  Name: Susan [name] MRN: [id number] Date of Birth: 1940-12-21

## 2020-03-28 ENCOUNTER — Encounter: Payer: Self-pay | Admitting: Podiatry

## 2020-03-28 NOTE — Progress Notes (Signed)
  Subjective:  Patient ID: Susan [name], female    DOB: 05-21-1940,  MRN: [id number]  Chief Complaint  Patient presents with  . Nail Problem    Nails are discolored    80 y.o. female returns for the above complaint.  Patient presents with thickened elongated dystrophic toenails x10.  They are painful to touch.  Patient would like to have them debrided down as she is not able to resolve.  She denies any other acute complaints.  Objective:  There were no vitals filed for this visit. Podiatric Exam: Vascular: dorsalis pedis and posterior tibial pulses are palpable bilateral. Capillary return is immediate. Temperature gradient is WNL. Skin turgor WNL  Sensorium: Normal Semmes Weinstein monofilament test. Normal tactile sensation bilaterally. Nail Exam: Pt has thick disfigured discolored nails with subungual debris noted bilateral entire nail hallux through fifth toenails.  Pain on palpation to the nails. Ulcer Exam: There is no evidence of ulcer or pre-ulcerative changes or infection. Orthopedic Exam: Muscle tone and strength are WNL. No limitations in general ROM. No crepitus or effusions noted. HAV  B/L.  Hammer toes 2-5  B/L. Skin: No Porokeratosis. No infection or ulcers    Assessment & Plan:   1. Pain due to onychomycosis of toenails of both feet     Patient was evaluated and treated and all questions answered.  Onychomycosis with pain  -Nails palliatively debrided as below. -Educated on self-care  Procedure: Nail Debridement Rationale: pain  Type of Debridement: manual, sharp debridement. Instrumentation: Nail nipper, rotary burr. Number of Nails: 10  Procedures and Treatment: Consent by patient was obtained for treatment procedures. The patient understood the discussion of treatment and procedures well. All questions were answered thoroughly reviewed. Debridement of mycotic and hypertrophic toenails, 1 through 5 bilateral and clearing of subungual debris. No ulceration, no  infection noted.  Return Visit-Office Procedure: Patient instructed to return to the office for a follow up visit 3 months for continued evaluation and treatment.  Boneta Lucks, DPM    No follow-ups on file.

## 2020-04-04 ENCOUNTER — Encounter: Payer: Medicare Other | Admitting: Physical Therapy

## 2020-06-21 ENCOUNTER — Telehealth: Payer: Self-pay

## 2020-06-21 NOTE — Telephone Encounter (Signed)
Hard to say.  Probably should come in for reevaluation.

## 2020-06-21 NOTE — Telephone Encounter (Signed)
Pt called stating that she is still in pain from her pervious surgery.   She would like to know if there is anything she can and or should be doing ?

## 2020-06-22 NOTE — Telephone Encounter (Signed)
Made her an appt.

## 2020-06-28 ENCOUNTER — Other Ambulatory Visit: Payer: Self-pay

## 2020-06-28 ENCOUNTER — Encounter (HOSPITAL_COMMUNITY): Payer: Self-pay

## 2020-06-28 ENCOUNTER — Encounter: Payer: Self-pay | Admitting: Orthopaedic Surgery

## 2020-06-28 ENCOUNTER — Ambulatory Visit (HOSPITAL_COMMUNITY)
Admission: RE | Admit: 2020-06-28 | Discharge: 2020-06-28 | Disposition: A | Discharge status: Home / Self Care | Payer: Medicare Other | Source: Ambulatory Visit | Attending: Cardiovascular Disease | Admitting: Cardiovascular Disease

## 2020-06-28 ENCOUNTER — Ambulatory Visit (INDEPENDENT_AMBULATORY_CARE_PROVIDER_SITE_OTHER): Payer: Medicare Other | Admitting: Orthopaedic Surgery

## 2020-06-28 DIAGNOSIS — Z96652 Presence of left artificial knee joint: Secondary | ICD-10-CM | POA: Diagnosis not present

## 2020-06-28 DIAGNOSIS — R600 Localized edema: Secondary | ICD-10-CM

## 2020-06-28 NOTE — Progress Notes (Unsigned)
Left lower venous has been completed and is Negative for DVT   Preliminary results can be found under CV proc through chart review.  Wilkie Aye RVT [facility]

## 2020-06-28 NOTE — Progress Notes (Signed)
Post-Op Visit Note   Patient: Susan [name]           Date of Birth: [birth year]           MRN: [id number] Visit Date: 06/28/2020 PCP: Sonia Side., FNP   Assessment & Plan:  Chief Complaint:  Chief Complaint  Patient presents with   Left Knee - Pain   Visit Diagnoses:  1. Hx of total knee replacement, left     Plan: Patient is a pleasant 80 year old female who comes in today 6 months out left total knee replacement, date of surgery 12/13/2019.  She still notes some slight discomfort to the knee but has recently been experiencing pain and swelling to the left calf.  She notes that she has been trying to walk more for exercise.  No chest pain or shortness of breath.  She is a non-smoker.  She is not on oral contraceptives.  She is on Eliquis for A. fib and previous CVA.  The pain she has to the knee is primarily to the anterior aspect and with full extension and when she is walking.  She has finished physical therapy.  Examination of her left lower extremity reveals range of motion from 0 to 100 degrees.  She is stable valgus varus stress.  She does not have any joint line tenderness.  No tenderness to the patella.  She does have moderate tenderness to the medial calf with associated swelling.  No erythema.  No warmth.  Negative Homans.  She is neurovascular intact distally.  At this point, I feel as though the knee pain she is getting is from the normal healing process in addition to her increased activity.  She will try to back off activity for short period of time and then gradually build back up.  She will follow-up with [place] in 3 months time for repeat evaluation and 2 view x-rays of the left knee.  She will ice when needed.  In regards to the left calf pain, I would like to order a stat ultrasound left lower extremity to rule out DVT.  It is reassuring, however that she is on Eliquis.  She will call with any concerns or questions in the meantime.  Follow-Up Instructions: Return in about  3 months (around 09/28/2020).   Orders:  No orders of the defined types were placed in this encounter.  No orders of the defined types were placed in this encounter.   Imaging: No new imaging  PMFS History: Patient Active Problem List   Diagnosis Date Noted   Acute pain of left knee 03/01/2020   Hx of total knee replacement, left 02/01/2020   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   Anemia of chronic disease 09/07/2018   Primary osteoarthritis of left knee 06/16/2018   Venous stasis dermatitis of left lower extremity 04/29/2018   Dementia ([place]) 03/12/2018   Recurrent cellulitis 02/12/2018   HTN (hypertension) 02/12/2018   Cellulitis and abscess of leg 12/29/2017   CKD (chronic kidney disease) stage 3, GFR 30-59 ml/min ([place]) 12/29/2017   Cellulitis of left lower extremity    Ankle edema    Sepsis ([place]) 12/21/2017   Medication management 10/11/2016   Hyperlipemia 06/18/2016   Gait abnormality 06/18/2016   Major depressive disorder, recurrent episode, severe ([place]) 11/21/2015   Edema 02/09/2015   History of CVA (cerebrovascular accident) 02/01/2015   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 ([place])  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) 01/26/2015   TIA (transient ischemic attack) 01/24/2015   Chronic diastolic (congestive) heart failure (HCC)    Obesity (BMI 30-39.9) 01/27/2013   Constipation 09/02/2012   H/O arthroscopy of right knee 08/06/2012   Anxiety    Depression    GERD (gastroesophageal reflux disease)    Hypertensive heart disease    OSA (obstructive sleep apnea)    Osteoarthritis of right knee    Hyperparathyroidism, primary ([place]) 04/22/2012   Chronic cough 10/02/2011    Past Medical History:  Diagnosis Date   Anxiety    Arthritis    "legs, back" (07/29/2012)   Cellulitis and abscess of leg 12/29/2017   Chronic diastolic (congestive) heart failure (HCC)    Chronic lower back pain    Complication of anesthesia    "I have apnea" (07/29/2012)   Dementia ([place]) 03/12/2018   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)    Family history of anesthesia complication    "some wake up during OR; some are hard to wake up; some both" (07/29/2012)   GERD (gastroesophageal reflux disease)    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 (HCC)    History of CVA (cerebrovascular accident) 02/01/2015   History of stomach ulcers 1980's   Hyperlipidemia    Hyperparathyroidism, primary ([place]) 04/22/2012   Hypertensive heart disease    Incontinent of urine    wears pads   Major depressive disorder, recurrent episode, severe ([place]) 11/21/2015   OSA on CPAP    Osteoarthritis of right knee    Pedal edema    Persistent atrial fibrillation (HCC)    CHADS2VASC score is 7 - on chronic anticoagulation with Apixaban   Spinal stenosis    Umbilical hernia    "unrepaired" (07/29/2012)   Varicose veins    "BLE" (07/29/2012)   Vascular dementia ([place])    Venous stasis dermatitis of left lower extremity 04/29/2018    Family History  Problem Relation Age of Onset   Hypertension Mother        deceased   Stroke Mother    Breast cancer Mother    Lung cancer Father        deceased   Diabetes Daughter    Hypertension Daughter     Past Surgical History:  Procedure Laterality Date   CATARACT EXTRACTION     COLONOSCOPY     ENDOVENOUS ABLATION SAPHENOUS VEIN W/ LASER Left 06/24/2019   endovenous laser ablation left greater saphenous vein by Gae Gallop MD    IUD REMOVAL  1980's   PARATHYROIDECTOMY N/A 06/16/2012   Procedure: NECK EXPLORATION AND LEFT SUPERIOR PARATHYROIDECTOMY;  Surgeon: Earnstine Regal, MD;  Location: [facility];  Service: General;  Laterality: N/A;    TOTAL KNEE ARTHROPLASTY Right 07/29/2012   TOTAL KNEE ARTHROPLASTY Right 07/29/2012   Procedure: TOTAL KNEE ARTHROPLASTY;  Surgeon: Ninetta Lights, MD;  Location: [facility];  Service: Orthopedics;  Laterality: Right;   TOTAL KNEE ARTHROPLASTY Left 12/13/2019   Procedure: LEFT TOTAL KNEE ARTHROPLASTY;  Surgeon: Leandrew Koyanagi, MD;  Location: [facility];  Service: Orthopedics;  Laterality: Left;   Social History   Occupational History   Occupation: retired    Fish farm manager: RETIRED  Tobacco Use   Smoking status: Former    Packs/day: 1.00    Years:  30.00    Pack years: 30.00    Types: Cigarettes    Quit date: 01/07/1978    Years since quitting: 42.5   Smokeless tobacco: Never  Vaping Use   Vaping Use: Never used  Substance and Sexual Activity   Alcohol use: No   Drug use: No   Sexual activity: Not Currently    Comment: intercourse age 21,less than 5 secxual partners,

## 2020-08-10 ENCOUNTER — Other Ambulatory Visit: Payer: Self-pay

## 2020-08-10 DIAGNOSIS — M5412 Radiculopathy, cervical region: Secondary | ICD-10-CM

## 2020-08-11 ENCOUNTER — Other Ambulatory Visit: Payer: Medicare Other

## 2020-08-15 ENCOUNTER — Other Ambulatory Visit: Payer: Self-pay

## 2020-08-15 DIAGNOSIS — G5691 Unspecified mononeuropathy of right upper limb: Secondary | ICD-10-CM

## 2020-08-15 DIAGNOSIS — G5692 Unspecified mononeuropathy of left upper limb: Secondary | ICD-10-CM

## 2020-08-29 ENCOUNTER — Ambulatory Visit
Admission: RE | Admit: 2020-08-29 | Discharge: 2020-08-29 | Disposition: A | Discharge status: Home / Self Care | Payer: Medicare Other | Source: Ambulatory Visit | Attending: Family | Admitting: Family

## 2020-08-29 DIAGNOSIS — G5692 Unspecified mononeuropathy of left upper limb: Secondary | ICD-10-CM

## 2020-08-29 DIAGNOSIS — G5691 Unspecified mononeuropathy of right upper limb: Secondary | ICD-10-CM

## 2020-08-30 ENCOUNTER — Ambulatory Visit: Payer: Medicare Other | Admitting: Podiatry

## 2020-09-07 ENCOUNTER — Other Ambulatory Visit: Payer: Self-pay | Admitting: *Deleted

## 2020-09-07 ENCOUNTER — Telehealth: Payer: Self-pay | Admitting: *Deleted

## 2020-09-07 DIAGNOSIS — I83812 Varicose veins of left lower extremities with pain: Secondary | ICD-10-CM

## 2020-09-07 NOTE — Telephone Encounter (Signed)
Returning Susan [name]'s earlier voice message.  Ms. Harness is s/p endovenous laser ablation Left GSV on 06-24-2019 by Gae Gallop MD.  Ms. Wehrle is c/o left leg pain and swelling and desires an appointment with Dr. Scot Dock to re-evaluate left leg.  Instructed Ms. Jenniges to wear her compression hose and to elevate her left leg when sitting.  Appointments made on 09-28-2020 for venous reflux study at 10:00 AM (left leg, order in Epic) and VV FU with Dr. Scot Dock at 10:40 AM.  Ms. Flaten verbalized understanding.

## 2020-09-14 ENCOUNTER — Telehealth: Payer: Self-pay | Admitting: Orthopaedic Surgery

## 2020-09-14 NOTE — Telephone Encounter (Signed)
Pt called stating she fell on her R knee yesterday but she isn't having any pain. Pt wanted to make sure she doesn't have to come into the office to get it checked since it isn't causing pain. Pt would like a CB.   [phone]

## 2020-09-14 NOTE — Telephone Encounter (Signed)
If she is able to bear weight and walk and she is not having pain then she does not need to get it checked out.

## 2020-09-15 NOTE — Telephone Encounter (Signed)
She is able to [facility]

## 2020-09-15 NOTE — Telephone Encounter (Signed)
Patient aware.

## 2020-09-28 ENCOUNTER — Encounter: Payer: Self-pay | Admitting: Vascular Surgery

## 2020-09-28 ENCOUNTER — Ambulatory Visit (INDEPENDENT_AMBULATORY_CARE_PROVIDER_SITE_OTHER): Payer: Medicare Other | Admitting: Vascular Surgery

## 2020-09-28 ENCOUNTER — Other Ambulatory Visit: Payer: Self-pay

## 2020-09-28 ENCOUNTER — Ambulatory Visit (HOSPITAL_COMMUNITY)
Admission: RE | Admit: 2020-09-28 | Discharge: 2020-09-28 | Disposition: A | Discharge status: Home / Self Care | Payer: Medicare Other | Source: Ambulatory Visit | Attending: Vascular Surgery | Admitting: Vascular Surgery

## 2020-09-28 VITALS — BP 161/65 | HR 50 | Temp 97.7°F | Resp 20 | Ht 61.0 in | Wt 162.0 lb

## 2020-09-28 DIAGNOSIS — I83812 Varicose veins of left lower extremities with pain: Secondary | ICD-10-CM | POA: Diagnosis not present

## 2020-09-28 NOTE — Progress Notes (Signed)
Patient name: Susan [name] MRN: [id number] DOB: Aug 20, 1940 Sex: female  REASON FOR VISIT:   Left leg swelling  HPI:   Susan [name] is a pleasant 80 y.o. female who had presented with chronic venous insufficiency.  She had failed conservative treatment and on 06/24/2019 underwent laser ablation of the left great saphenous vein.  Postoperative study showed no evidence of DVT and successful ablation of the left great saphenous vein from 1.7 cm distal to the saphenofemoral junction to the knee.  She had been doing well but though recently she is developed some swelling in the left leg and wanted to have this checked out.  Her main complaint is swelling.  She describes minimal symptoms associated with the swelling.  There have been no significant changes to her medical history.  Current Outpatient Medications  Medication Sig Dispense Refill   apixaban (ELIQUIS) 5 MG TABS tablet Take 1 tablet (5 mg total) by mouth every 12 (twelve) hours. 60 tablet 4   azelastine (ASTELIN) 0.1 % nasal spray Place 1 spray into both nostrils 2 (two) times daily as needed for rhinitis. Use in each nostril as directed     buPROPion (WELLBUTRIN XL) 150 MG 24 hr tablet Take 150 mg by mouth daily.     busPIRone (BUSPAR) 15 MG tablet Take 15 mg by mouth 2 (two) times daily.     Calcium Carbonate-Vitamin D 600-400 MG-UNIT tablet TAKE 1 TABLET BY MOUTH EVERY DAY 30 tablet 2   carvedilol (COREG) 12.5 MG tablet TAKE 1 TABLET BY MOUTH 2 TIMES DAILY WITH MEALS (Patient taking differently: Take 12.5 mg by mouth 2 (two) times daily with a meal.) 60 tablet 5   diclofenac sodium (VOLTAREN) 1 % GEL Apply 2 g topically 4 (four) times daily. (Patient taking differently: Apply 2 g topically 4 (four) times daily as needed (pain).) 1 Tube 5   DOK 100 MG capsule TAKE 1 CAPSULE BY MOUTH 2 TIMES DAILY (Patient taking differently: Take 100 mg by mouth 2 (two) times daily.) 60 capsule 5   donepezil (ARICEPT) 10 MG tablet Take 1 tablet (10 mg  total) by mouth at bedtime. For memory loss 90 tablet 1   FLORASTOR 250 MG capsule TAKE 1 CAPSULE BY MOUTH 2 TIMES DAILY (Patient taking differently: Take 250 mg by mouth 2 (two) times daily.) 60 capsule 5   furosemide (LASIX) 40 MG tablet Take 40 mg by mouth daily.     hydrALAZINE (APRESOLINE) 25 MG tablet TAKE 1 TABLET BY MOUTH 3 TIMES DAILY (Patient taking differently: Take 25 mg by mouth 3 (three) times daily.) 90 tablet 5   LORazepam (ATIVAN) 1 MG tablet Take 1 tablet 30 minutes prior to leaving house on day of procedure.  Bring second tablet to office on day of procedure. 2 tablet 0   losartan (COZAAR) 50 MG tablet TAKE 1 TABLET BY MOUTH EVERY DAY (Patient taking differently: Take 50 mg by mouth daily.) 30 tablet 3   Menthol, Topical Analgesic, (BIOFREEZE) 4 % GEL Apply 3 oz topically 3 (three) times daily. For knee pain (Patient taking differently: Apply 1 application topically 3 (three) times daily as needed (knee pain).) 1 Tube 3   methocarbamol (ROBAXIN) 500 MG tablet Take 1 tablet (500 mg total) by mouth 2 (two) times daily as needed. 20 tablet 0   Multiple Vitamin (GNP ESSENTIAL ONE DAILY) TABS TAKE 1 TABLET BY MOUTH EVERY DAY (Patient taking differently: Take 1 tablet by mouth daily.) 30 tablet 11  ondansetron (ZOFRAN) 4 MG tablet Take 1 tablet (4 mg total) by mouth every 8 (eight) hours as needed for nausea or vomiting. 40 tablet 0   oxyCODONE-acetaminophen (PERCOCET) 7.5-325 MG tablet Take 1-2 tabs po every 6 hours prn pain 40 tablet 0   pantoprazole (PROTONIX) 40 MG tablet TAKE 1 TABLET BY MOUTH EVERY DAY (Patient taking differently: Take 40 mg by mouth daily.) 90 tablet 0   pravastatin (PRAVACHOL) 40 MG tablet TAKE 1 TABLET BY MOUTH EVERY DAY (Patient taking differently: Take 40 mg by mouth daily.) 30 tablet 5   sertraline (ZOLOFT) 100 MG tablet Take 100 mg by mouth daily.     No current facility-administered medications for this visit.    REVIEW OF SYSTEMS:  [X]  denotes positive  finding, [ ]  denotes negative finding Vascular    Leg swelling    Cardiac    Chest pain or chest pressure:    Shortness of breath upon exertion:    Short of breath when lying flat:    Irregular heart rhythm:    Constitutional    Fever or chills:     PHYSICAL EXAM:   Vitals:   09/28/20 1017  BP: (!) 161/65  Pulse: (!) 50  Resp: 20  Temp: 97.7 F (36.5 C)  SpO2: 98%  Weight: 162 lb (73.5 kg)  Height: 5\' 1"  (1.549 m)    GENERAL: The patient is a well-nourished female, in no acute distress. The vital signs are documented above. CARDIOVASCULAR: There is a regular rate and rhythm. PULMONARY: There is good air exchange bilaterally without wheezing or rales. VASCULAR: She does have moderate left leg swelling. Both feet are warm and well-perfused.  DATA:   DUPLEX: I have independently interpreted her venous duplex scan today.  On the left side there is no evidence of DVT.  She does have significant deep venous reflux involving the common femoral vein, femoral vein, and popliteal vein.  Her left great saphenous vein has been successfully ablated.  There is no superficial venous reflux.  MEDICAL ISSUES:   CHRONIC VENOUS INSUFFICIENCY: I think her swelling is likely related to her deep venous reflux.  She has not been elevating her legs as much lately and I think this is contributing.  I have recommended that she elevate her legs on her leg rest for 15 to 20 minutes 2-3 times a day.  Once the swelling is better I would recommend that she be fitted for a knee-high compression stocking with a gradient of 15 to 20 mmHg.  We have also discussed importance of exercise specifically walking and water aerobics.  I have encouraged her to avoid prolonged sitting and standing.  We discussed the importance of maintaining a healthy weight also.  I will see her back as needed.  Deitra Mayo [facility] [phone]

## 2020-11-28 ENCOUNTER — Other Ambulatory Visit: Payer: Medicare Other

## 2020-12-04 NOTE — Progress Notes (Incomplete)
°  HEMATOLOGY-ONCOLOGY MYCHART VIDEO VISIT PROGRESS NOTE  I connected with Susan [name] on 12/05/2020 at  2:45 PM EST by MyChart video conference and verified that I am speaking with the correct person using two identifiers.  I discussed the limitations, risks, security and privacy concerns of performing an evaluation and management service by MyChart and the availability of in person appointments.  I also discussed with the patient that there may be a patient responsible charge related to this service. The patient expressed understanding and agreed to proceed.  Patient's Location: Home Physician Location: Clinic  CHIEF COMPLIANT: Follow-up of anemia and leukopenia  INTERVAL HISTORY: Susan [name] is a 80 y.o. female with above-mentioned history of anemia and leukopenia. She presents over MyChart today to review her labs.  Observations/Objective:  There were no vitals filed for this visit. There is no height or weight on file to calculate BMI.  I have reviewed the data as listed CMP Latest Ref Rng & Units 12/14/2019 12/08/2019 11/29/2019  Glucose 70 - 99 mg/dL 146(H) 91 113(H)  BUN 8 - 23 mg/dL 20 21 19   Creatinine 0.44 - 1.00 mg/dL 1.40(H) 1.50(H) 1.56(H)  Sodium 135 - 145 mmol/L 139 143 143  Potassium 3.5 - 5.1 mmol/L 3.7 3.7 3.4(L)  Chloride 98 - 111 mmol/L 106 106 110  CO2 22 - 32 mmol/L 24 27 26   Calcium 8.9 - 10.3 mg/dL 9.4 10.6(H) 10.3  Total Protein 6.5 - 8.1 g/dL - 7.1 7.7  Total Bilirubin 0.3 - 1.2 mg/dL - 0.6 0.4  Alkaline Phos 38 - 126 U/L - 67 75  AST 15 - 41 U/L - 22 19  ALT 0 - 44 U/L - 17 12    Lab Results  Component Value Date   WBC 5.6 12/14/2019   HGB 9.8 (L) 12/14/2019   HCT 28.8 (L) 12/14/2019   MCV 94.1 12/14/2019   PLT 197 12/14/2019   NEUTROABS 1.8 12/08/2019      Assessment Plan:  Anemia of chronic disease Workup performed 11/30/2018: 1. CBC with differential : Hemoglobin 11.4, WBC 2.6, ANC 1.3 2.  LDH: 198, absolute reticulocyte count: 42.9: No  evidence of hemolysis   3. SPEP: M protein 0.4 g IgG kappa 4. Iron studies: Iron saturation 35%, ferritin 20 5.  Erythropoietin 16.2 6.  ANA: Negative 6.  Hemoglobin electrophoresis: Normal adult hemoglobin Previous work-up with B12 was normal.   Diagnosis: Anemia of chronic disease Current treatment: Watchful monitoring since that hemoglobin is about 10.  Lab review  11/29/2019: Hemoglobin 11, MCV 94.1, WBC 3.6, ANC 1.9 08/04/2020:  I discussed with the patient that the hemoglobin and the white count are stable. Return to clinic in 1 year for follow-up    I discussed the assessment and treatment plan with the patient. The patient was provided an opportunity to ask questions and all were answered. The patient agreed with the plan and demonstrated an understanding of the instructions. The patient was advised to call back or seek an in-person evaluation if the symptoms worsen or if the condition fails to improve as anticipated.   Total time spent: 20 minutes including face-to-face MyChart video visit time and time spent for planning, charting and coordination of care  Rulon Eisenmenger, MD 12/05/2020  I, Thana Ates am acting as scribe for Nicholas Lose, MD.  I have reviewed the above documentation for accuracy and completeness, and I agree with the above.

## 2020-12-05 ENCOUNTER — Inpatient Hospital Stay: Payer: Medicare Other | Attending: Hematology and Oncology | Admitting: Hematology and Oncology

## 2020-12-05 NOTE — Assessment & Plan Note (Deleted)
Workup performed11/23/2020: 1. CBC with differential : Hemoglobin 11.4, WBC 2.6, ANC 1.3 2. LDH: 198,absolutereticulocyte count: 42.9: No evidence of hemolysis 3.SPEP: M protein 0.4 g IgG kappa 4. Ironstudies:Iron saturation 35%, ferritin 20 5.Erythropoietin 16.2 6.ANA: Negative 6.Hemoglobin electrophoresis: Normal adult hemoglobin Previous work-up with B12 was normal.  Diagnosis: Anemia of chronic disease Current treatment: Watchful monitoring since that hemoglobin is about 10.  Lab review  11/29/2019: Hemoglobin 11, MCV 94.1, WBC 3.6, ANC 1.9 08/04/2020:  I discussed with the patient that the hemoglobin and the white count are stable. Return to clinic in 1 year for follow-up

## 2020-12-13 ENCOUNTER — Other Ambulatory Visit: Payer: Self-pay | Admitting: Neurosurgery

## 2020-12-13 DIAGNOSIS — M4316 Spondylolisthesis, lumbar region: Secondary | ICD-10-CM

## 2021-01-15 ENCOUNTER — Other Ambulatory Visit: Payer: Self-pay | Admitting: Physician Assistant

## 2021-01-18 ENCOUNTER — Telehealth: Payer: Self-pay | Admitting: Orthopaedic Surgery

## 2021-01-18 NOTE — Telephone Encounter (Signed)
Ok to do for two years post-op

## 2021-01-18 NOTE — Telephone Encounter (Signed)
[facility] called requesting a letter stating pt does need to take pre meds for dental procedures. 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 Please fax to [phone].

## 2021-01-19 ENCOUNTER — Telehealth: Payer: Self-pay

## 2021-01-19 NOTE — Telephone Encounter (Signed)
Pharmacy called and wanted to know the status of the request for the docusate sodium rx. Please advise

## 2021-01-19 NOTE — Telephone Encounter (Signed)
FAXED

## 2021-01-21 ENCOUNTER — Other Ambulatory Visit: Payer: Self-pay | Admitting: Physician Assistant

## 2021-01-21 NOTE — Telephone Encounter (Signed)
This is the first I have heard or seen anything about it.  Does she need a rx or a refill?  If so, do you mind sending in with previous instructions if she has had before

## 2021-01-22 ENCOUNTER — Other Ambulatory Visit: Payer: Self-pay | Admitting: Physician Assistant

## 2021-01-22 ENCOUNTER — Other Ambulatory Visit: Payer: Self-pay

## 2021-01-22 MED ORDER — DOCUSATE SODIUM 100 MG PO CAPS: 100.0000 mg | ORAL_CAPSULE | Freq: Every day | ORAL | 2 refills | Status: AC | PRN

## 2021-01-22 NOTE — Telephone Encounter (Signed)
Needs RX sent to pharm.  Do not see prev Rx on file. Thanks.

## 2021-01-22 NOTE — Progress Notes (Unsigned)
c 

## 2021-01-22 NOTE — Telephone Encounter (Signed)
sent 

## 2021-02-01 ENCOUNTER — Other Ambulatory Visit: Payer: Self-pay | Admitting: Student

## 2021-02-01 ENCOUNTER — Ambulatory Visit
Admission: RE | Admit: 2021-02-01 | Discharge: 2021-02-01 | Disposition: A | Discharge status: Home / Self Care | Payer: Medicare Other | Source: Ambulatory Visit | Attending: Student | Admitting: Student

## 2021-02-01 DIAGNOSIS — R053 Chronic cough: Secondary | ICD-10-CM

## 2021-07-03 ENCOUNTER — Telehealth: Payer: Self-pay | Admitting: Cardiology

## 2021-07-03 NOTE — Telephone Encounter (Signed)
Patient will need updated CBC at appointment with Tereso Newcomer on July 3.    If dental office requests more than 1 day hold will need to clear with Dr. Mayford Knife due to elevated CHADS2-VASc score

## 2021-07-03 NOTE — Telephone Encounter (Addendum)
Will route to callback to tentatively check with dental office that they would be OK with 1 day hold. If not, please cc to Dr. Mayford Knife for input. Patient sees Lorin Picket 7/3, added need for updated labs to appt info.

## 2021-07-04 ENCOUNTER — Emergency Department (HOSPITAL_COMMUNITY)
Admission: EM | Admit: 2021-07-04 | Discharge: 2021-07-05 | Disposition: A | Discharge status: Home / Self Care | Payer: Medicare Other | Attending: Emergency Medicine | Admitting: Emergency Medicine

## 2021-07-04 ENCOUNTER — Other Ambulatory Visit: Payer: Self-pay

## 2021-07-04 ENCOUNTER — Encounter (HOSPITAL_COMMUNITY): Payer: Self-pay | Admitting: Emergency Medicine

## 2021-07-04 DIAGNOSIS — F331 Major depressive disorder, recurrent, moderate: Secondary | ICD-10-CM

## 2021-07-04 DIAGNOSIS — Z91040 Latex allergy status: Secondary | ICD-10-CM | POA: Diagnosis not present

## 2021-07-04 DIAGNOSIS — Z7901 Long term (current) use of anticoagulants: Secondary | ICD-10-CM | POA: Insufficient documentation

## 2021-07-04 DIAGNOSIS — R45851 Suicidal ideations: Secondary | ICD-10-CM

## 2021-07-04 DIAGNOSIS — Z20822 Contact with and (suspected) exposure to covid-19: Secondary | ICD-10-CM | POA: Insufficient documentation

## 2021-07-04 DIAGNOSIS — F332 Major depressive disorder, recurrent severe without psychotic features: Secondary | ICD-10-CM | POA: Diagnosis not present

## 2021-07-04 DIAGNOSIS — F32A Depression, unspecified: Secondary | ICD-10-CM | POA: Diagnosis present

## 2021-07-04 DIAGNOSIS — F329 Major depressive disorder, single episode, unspecified: Secondary | ICD-10-CM

## 2021-07-04 LAB — COMPREHENSIVE METABOLIC PANEL
ALT: 18 U/L (ref 0–44)
AST: 27 U/L (ref 15–41)
Albumin: 3.9 g/dL (ref 3.5–5.0)
Alkaline Phosphatase: 70 U/L (ref 38–126)
Anion gap: 8 (ref 5–15)
BUN: 30 mg/dL — ABNORMAL HIGH (ref 8–23)
CO2: 23 mmol/L (ref 22–32)
Calcium: 10.7 mg/dL — ABNORMAL HIGH (ref 8.9–10.3)
Chloride: 110 mmol/L (ref 98–111)
Creatinine, Ser: 1.7 mg/dL — ABNORMAL HIGH (ref 0.44–1.00)
GFR, Estimated: 30 mL/min — ABNORMAL LOW (ref >60)
Glucose, Bld: 116 mg/dL — ABNORMAL HIGH (ref 70–99)
Potassium: 4 mmol/L (ref 3.5–5.1)
Sodium: 141 mmol/L (ref 135–145)
Total Bilirubin: 0.4 mg/dL (ref 0.3–1.2)
Total Protein: 7.1 g/dL (ref 6.5–8.1)

## 2021-07-04 LAB — CBC WITH DIFFERENTIAL/PLATELET
Abs Immature Granulocytes: 0.01 10*3/uL (ref 0.00–0.07)
Basophils Absolute: 0 10*3/uL (ref 0.0–0.1)
Basophils Relative: 1 %
Eosinophils Absolute: 0.2 10*3/uL (ref 0.0–0.5)
Eosinophils Relative: 5 %
HCT: 31.7 % — ABNORMAL LOW (ref 36.0–46.0)
Hemoglobin: 10.1 g/dL — ABNORMAL LOW (ref 12.0–15.0)
Immature Granulocytes: 0 %
Lymphocytes Relative: 33 %
Lymphs Abs: 1.2 10*3/uL (ref 0.7–4.0)
MCH: 30.6 pg (ref 26.0–34.0)
MCHC: 31.9 g/dL (ref 30.0–36.0)
MCV: 96.1 fL (ref 80.0–100.0)
Monocytes Absolute: 0.4 10*3/uL (ref 0.1–1.0)
Monocytes Relative: 11 %
Neutro Abs: 1.9 10*3/uL (ref 1.7–7.7)
Neutrophils Relative %: 50 %
Platelets: 236 10*3/uL (ref 150–400)
RBC: 3.3 MIL/uL — ABNORMAL LOW (ref 3.87–5.11)
RDW: 13.2 % (ref 11.5–15.5)
WBC: 3.8 10*3/uL — ABNORMAL LOW (ref 4.0–10.5)
nRBC: 0 % (ref 0.0–0.2)

## 2021-07-04 LAB — ETHANOL: Alcohol, Ethyl (B): <10 mg/dL (ref <10)

## 2021-07-04 LAB — ACETAMINOPHEN LEVEL: Acetaminophen (Tylenol), Serum: <10 ug/mL — ABNORMAL LOW (ref 10–30)

## 2021-07-04 LAB — SALICYLATE LEVEL: Salicylate Lvl: <7 mg/dL — ABNORMAL LOW (ref 7.0–30.0)

## 2021-07-04 NOTE — ED Provider Triage Note (Signed)
Emergency Medicine Provider Triage Evaluation Note  Susan [name] , a 81 y.o. female  was evaluated in triage.  Pt complains of suicidal ideation with plan to overdose on her prescription medications.  History of suicidal ideation in the past without plan, follows with Elta Guadeloupe in the outpatient setting for this.  No hallucinations or homicidality.  Recently prescribed calcium and vitamin D3 but no other new changes in medications.  Accompanied by her daughter at the bedside.  Review of Systems  Positive: Suicidality with plan Negative: Homicidality, AVH  Physical Exam  BP (!) 153/77   Pulse (!) 52   Temp 98.6 F (37 C) (Oral)   Resp 18   SpO2 100%  Gen:   Awake, no distress   Resp:  Normal effort  MSK:   Moves extremities without difficulty  Other:  RRR no M setters G.  Lungs CTA B.  Does not appear to be responding to internal stimuli at this time.  Medical Decision Making  Medically screening exam initiated at 10:46 PM.  Appropriate orders placed.  Susan [name] was informed that the remainder of the evaluation will be completed by another provider, this initial triage assessment does not replace that evaluation, and the importance of remaining in the ED until their evaluation is complete.  This chart was dictated using voice recognition software, Dragon. Despite the best efforts of this provider to proofread and correct errors, errors may still occur which can change documentation meaning.    Emeline Darling, PA-C 07/04/21 2300

## 2021-07-04 NOTE — Telephone Encounter (Signed)
I s/w the dental office per the pre op provider to confirm the dentist would acceptable to holding Eliquis x 1 day as being recommended. If need longer hold time, will d/w the Dr. Radford Pax for further advice. Plan is for me to fax over [place] to requesting office for DDS to review the recommendations. Their office will let us know if they will require longer hold time of Eliquis. I did inform the DDS office the pt has apt 07/09/21 with Richardson Dopp, PAC , and will also needs labs before any final clearance is faxed over.   I will fax these notes to [facility] for upcoming appt   Will send FYI to requesting office; this is not a final clearance being faxed over at this time.

## 2021-07-04 NOTE — ED Triage Notes (Addendum)
Patient reports suicidal  ideation and depression onset today , denies hallucinations . Plans to overdose on medications.

## 2021-07-04 NOTE — ED Notes (Signed)
Security at bedside to wand the pt and dressed in purple scrubs.

## 2021-07-05 DIAGNOSIS — R45851 Suicidal ideations: Secondary | ICD-10-CM

## 2021-07-05 DIAGNOSIS — F331 Major depressive disorder, recurrent, moderate: Secondary | ICD-10-CM

## 2021-07-05 LAB — URINALYSIS, ROUTINE W REFLEX MICROSCOPIC
Bacteria, UA: NONE SEEN
Bilirubin Urine: NEGATIVE
Glucose, UA: NEGATIVE mg/dL
Ketones, ur: NEGATIVE mg/dL
Nitrite: NEGATIVE
Protein, ur: NEGATIVE mg/dL
Specific Gravity, Urine: 1.013 (ref 1.005–1.030)
pH: 5 (ref 5.0–8.0)

## 2021-07-05 LAB — RESP PANEL BY RT-PCR (FLU A&B, COVID) ARPGX2
Influenza A by PCR: NEGATIVE
Influenza B by PCR: NEGATIVE
SARS Coronavirus 2 by RT PCR: NEGATIVE

## 2021-07-05 LAB — RAPID URINE DRUG SCREEN, HOSP PERFORMED
Amphetamines: NOT DETECTED
Barbiturates: NOT DETECTED
Benzodiazepines: NOT DETECTED
Cocaine: NOT DETECTED
Opiates: NOT DETECTED
Tetrahydrocannabinol: NOT DETECTED

## 2021-07-05 MED ORDER — PRAVASTATIN SODIUM 40 MG PO TABS
40.0000 mg | ORAL_TABLET | Freq: Every day | ORAL | Status: DC
  Administered 2021-07-05: 40 mg via ORAL
  Filled 2021-07-05: qty 1

## 2021-07-05 MED ORDER — BUPROPION HCL ER (XL) 150 MG PO TB24: 150.0000 mg | ORAL_TABLET | Freq: Every day | ORAL | Status: DC

## 2021-07-05 MED ORDER — PANTOPRAZOLE SODIUM 40 MG PO TBEC
40.0000 mg | DELAYED_RELEASE_TABLET | Freq: Every day | ORAL | Status: DC
  Administered 2021-07-05: 40 mg via ORAL
  Filled 2021-07-05: qty 1

## 2021-07-05 MED ORDER — BUSPIRONE HCL 10 MG PO TABS
15.0000 mg | ORAL_TABLET | Freq: Two times a day (BID) | ORAL | Status: DC
  Administered 2021-07-05: 15 mg via ORAL
  Filled 2021-07-05 (×2): qty 2

## 2021-07-05 MED ORDER — APIXABAN 5 MG PO TABS
5.0000 mg | ORAL_TABLET | Freq: Two times a day (BID) | ORAL | Status: DC
  Administered 2021-07-05: 5 mg via ORAL
  Filled 2021-07-05 (×2): qty 1

## 2021-07-05 MED ORDER — SERTRALINE HCL 100 MG PO TABS
100.0000 mg | ORAL_TABLET | Freq: Every day | ORAL | Status: DC
  Administered 2021-07-05: 100 mg via ORAL
  Filled 2021-07-05: qty 1

## 2021-07-05 MED ORDER — DONEPEZIL HCL 5 MG PO TABS
10.0000 mg | ORAL_TABLET | Freq: Every day | ORAL | Status: DC
  Filled 2021-07-05: qty 1

## 2021-07-05 MED ORDER — LOSARTAN POTASSIUM 50 MG PO TABS
50.0000 mg | ORAL_TABLET | Freq: Every day | ORAL | Status: DC
  Administered 2021-07-05: 50 mg via ORAL
  Filled 2021-07-05: qty 1

## 2021-07-05 MED ORDER — OXYCODONE-ACETAMINOPHEN 5-325 MG PO TABS: 1.5000 | ORAL_TABLET | Freq: Four times a day (QID) | ORAL | Status: DC | PRN

## 2021-07-05 MED ORDER — ONDANSETRON HCL 4 MG PO TABS: 4.0000 mg | ORAL_TABLET | Freq: Three times a day (TID) | ORAL | Status: DC | PRN

## 2021-07-05 MED ORDER — FUROSEMIDE 20 MG PO TABS
40.0000 mg | ORAL_TABLET | Freq: Every day | ORAL | Status: DC
  Administered 2021-07-05: 40 mg via ORAL
  Filled 2021-07-05: qty 2

## 2021-07-05 MED ORDER — HYDRALAZINE HCL 25 MG PO TABS
25.0000 mg | ORAL_TABLET | Freq: Three times a day (TID) | ORAL | Status: DC
  Administered 2021-07-05: 25 mg via ORAL
  Filled 2021-07-05: qty 1

## 2021-07-05 MED ORDER — CARVEDILOL 12.5 MG PO TABS
12.5000 mg | ORAL_TABLET | Freq: Two times a day (BID) | ORAL | Status: DC
  Filled 2021-07-05: qty 1

## 2021-07-05 NOTE — ED Notes (Signed)
This RN attempted to assess patient for suicidal ideation and patient stated she would not tell RN or ever ask for help again because she does not want to sit here waiting to go home again. Patient updated on plan to be re-assessed today by [facility] team. Patient unhappy about waiting but calm and cooperative at this time.

## 2021-07-05 NOTE — ED Notes (Signed)
Patient's belonging's found in purple locker #1

## 2021-07-05 NOTE — ED Notes (Signed)
UA attempted. Missed collection hat in toilet.

## 2021-07-05 NOTE — Discharge Instructions (Addendum)
Substance Abuse Treatment Programs ° °Intensive Outpatient Programs °[facility]     °[address]      °[place], [place]                   °[phone]      ° °[facility] °[address] °[place], [place] °[phone] ° °[facility]     °(Inpatient and outpatient)     °[address]           °[phone]   ° °[facility] °[phone] (Suboxone and Methadone) ° °[address]      °[place], [place] [address]      °[phone]      ° °[address] °[place], [place] °[phone] ° °[facility] (Outpatient/Inpatient, [facility])    °(insurance only) [phone]      °       °[facility] (Groups & Residential) °[place], [place] °[phone] ° °   °[facility]     °[address]     °[place], [place]      °[phone]      ° °[facility] (for caregivers and family) °[address] °[place], [place] °[phone] ° ° ° ° ° °Residential Treatment Programs °[facility]      °[address], [place], [place] [address]  °[phone]      ° °[facility] °[address] [place], [place] [address] °[phone] ° °[facility]        °[phone]      ° °[facility] °[phone] ° °[facility]             °[address]                                         °[place], [place]                                                °[phone] or [phone]                              ° °[facility] [place] °[address] °[place], [address] °[phone] ° °[facility]    °[address]      °[place], [place]     °[phone]      ° °[facility] °[address] °, [place] °[phone] ° °[facility]   °[address]     °[place], [place] [address]     °[phone]      °Admissions: 8am-3pm M-F ° °[facility] °[address] °[place],  [place] °[phone] ° °[facility]: Residential Program (90 Days)   °[place], [place]      °[phone] or [phone]    ° °[facility] °[place], [place] °(Walk in Hours over the weekend or by referral) ° °[facility] °[address], [place],  [address] °[phone] ° °Crisis Mobile: [facility]:  1-877-626-1772 (for crisis response 24 hours a day) °[facility] Hotline:      1-800-256-2452 °Outpatient Psychiatry and Counseling ° °[facility]: Mobile Crisis   Management 24 hours:  ([phone]  For your behavioral health needs you are advised to continue treatment with [facility]:  For your mental health needs, you are advised to continue treatment with [facility]:       [facility]      [address], [place], [place] [address]      [phone]   [facility]) M-F 8am-3pm   [address], [place] [address]   [phone] Services include: laundry, barbering, support groups, case management, phone  & computer access, showers, AA/NA mtgs, mental health/substance abuse nurse, job skills class, disability information, VA assistance, spiritual classes, etc.   [facility]   [address], [place]     [facility] (women and children)       [place]. [place], [place] [address] [phone] [email or web address] for application and process Application Required  [facility]   [address]    [place] [address]     [phone]                    [place], [place] [address] [phone]([email or web address] application appt.) Application Required  [facility] (women only)    [address]     [place], [place] [address]     [phone]      Intake starts 6pm daily Need valid ID, SSC, &  Police report [facility] [address] [place], [place] [phone] Application Required  [facility] (men only)     [place].      [place], [place]       [facility] (Pregnant women only) [address] [place], [place]  [facility]      [place] [address]      [place], [place] [address]     [phone]             [facility] [address] [place], [place] 90 day commitment/SA/Application process  [facility](men only)     [address]     [place], [place]       Check-in at [facility] [address] [place], [place] [address] [phone] Men/Women/Women and Children must be there by 7 pm  [place], [place]

## 2021-07-05 NOTE — ED Provider Notes (Addendum)
[facility] EMERGENCY DEPARTMENT Provider Note   CSN: [id number] Arrival date & time: 07/04/21  2151     History  Chief Complaint  Patient presents with   Suicidal    Susan [name] is a 81 y.o. female.  Presents to the emergency department for evaluation of worsening depression.  Patient admits to being depressed for a long time.  Today she reports that she has felt more hopeless than usual.  She has thought about killing herself by taking a drug overdose.  She reports that she has had these thoughts in the past, but usually when she thinks about how it would affect her children, the thoughts go away.  Today this did not help, she still feels suicidal.       Home Medications Prior to Admission medications   Medication Sig Start Date End Date Taking? Authorizing Provider  docusate sodium (COLACE) 100 MG capsule Take 1 capsule (100 mg total) by mouth daily as needed. 01/22/21 01/22/22  Aundra Dubin, PA-C  apixaban (ELIQUIS) 5 MG TABS tablet Take 1 tablet (5 mg total) by mouth every 12 (twelve) hours. 05/22/18   Lauree Chandler, NP  azelastine (ASTELIN) 0.1 % nasal spray Place 1 spray into both nostrils 2 (two) times daily as needed for rhinitis. Use in each nostril as directed    [provider]  buPROPion (WELLBUTRIN XL) 150 MG 24 hr tablet Take 150 mg by mouth daily.    [provider]  busPIRone (BUSPAR) 15 MG tablet Take 15 mg by mouth 2 (two) times daily.    [provider]  Calcium Carbonate-Vitamin D 600-400 MG-UNIT tablet TAKE 1 TABLET BY MOUTH EVERY DAY 07/09/18   Lauree Chandler, NP  carvedilol (COREG) 12.5 MG tablet TAKE 1 TABLET BY MOUTH 2 TIMES DAILY WITH MEALS Patient taking differently: Take 12.5 mg by mouth 2 (two) times daily with a meal. 06/17/18   Lauree Chandler, NP  diclofenac sodium (VOLTAREN) 1 % GEL Apply 2 g topically 4 (four) times daily. Patient taking differently: Apply 2 g topically 4 (four) times daily  as needed (pain). 05/25/18   Lauree Chandler, NP  DOK 100 MG capsule TAKE 1 CAPSULE BY MOUTH 2 TIMES DAILY Patient taking differently: Take 100 mg by mouth 2 (two) times daily. 06/17/18   Lauree Chandler, NP  donepezil (ARICEPT) 10 MG tablet Take 1 tablet (10 mg total) by mouth at bedtime. For memory loss 05/25/18   Lauree Chandler, NP  FLORASTOR 250 MG capsule TAKE 1 CAPSULE BY MOUTH 2 TIMES DAILY Patient taking differently: Take 250 mg by mouth 2 (two) times daily. 06/17/18   Lauree Chandler, NP  furosemide (LASIX) 40 MG tablet Take 40 mg by mouth daily.    [provider]  hydrALAZINE (APRESOLINE) 25 MG tablet TAKE 1 TABLET BY MOUTH 3 TIMES DAILY Patient taking differently: Take 25 mg by mouth 3 (three) times daily. 06/17/18   Lauree Chandler, NP  LORazepam (ATIVAN) 1 MG tablet Take 1 tablet 30 minutes prior to leaving house on day of procedure.  Bring second tablet to office on day of procedure. 06/22/19   Angelia Mould, MD  losartan (COZAAR) 50 MG tablet TAKE 1 TABLET BY MOUTH EVERY DAY Patient taking differently: Take 50 mg by mouth daily. 08/07/18   Lauree Chandler, NP  Menthol, Topical Analgesic, (BIOFREEZE) 4 % GEL Apply 3 oz topically 3 (three) times daily. For knee pain Patient taking  differently: Apply 1 application topically 3 (three) times daily as needed (knee pain). 09/25/17   Ngetich, Dinah C, NP  methocarbamol (ROBAXIN) 500 MG tablet Take 1 tablet (500 mg total) by mouth 2 (two) times daily as needed. 12/14/19   Aundra Dubin, PA-C  Multiple Vitamin (GNP ESSENTIAL ONE DAILY) TABS TAKE 1 TABLET BY MOUTH EVERY DAY Patient taking differently: Take 1 tablet by mouth daily. 06/17/18   Lauree Chandler, NP  ondansetron (ZOFRAN) 4 MG tablet Take 1 tablet (4 mg total) by mouth every 8 (eight) hours as needed for nausea or vomiting. 12/14/19   Aundra Dubin, PA-C  oxyCODONE-acetaminophen (PERCOCET) 7.5-325 MG tablet Take 1-2 tabs po every 6 hours prn pain  12/20/19   Aundra Dubin, PA-C  pantoprazole (PROTONIX) 40 MG tablet TAKE 1 TABLET BY MOUTH EVERY DAY Patient taking differently: Take 40 mg by mouth daily. 08/07/18   Lauree Chandler, NP  pravastatin (PRAVACHOL) 40 MG tablet TAKE 1 TABLET BY MOUTH EVERY DAY Patient taking differently: Take 40 mg by mouth daily. 06/17/18   Lauree Chandler, NP  sertraline (ZOLOFT) 100 MG tablet Take 100 mg by mouth daily.    [provider]      Allergies    Ace inhibitors, Lipitor [atorvastatin], Simvastatin, and Latex    Review of Systems   Review of Systems  Physical Exam Updated Vital Signs BP (!) 153/77   Pulse (!) 52   Temp 98.6 F (37 C) (Oral)   Resp 18   SpO2 100%  Physical Exam Vitals and nursing note reviewed.  Constitutional:      General: She is not in acute distress.    Appearance: She is well-developed.  HENT:     Head: Normocephalic and atraumatic.     Mouth/Throat:     Mouth: Mucous membranes are moist.  Eyes:     General: Vision grossly intact. Gaze aligned appropriately.     Extraocular Movements: Extraocular movements intact.     Conjunctiva/sclera: Conjunctivae normal.  Cardiovascular:     Rate and Rhythm: Normal rate and regular rhythm.     Pulses: Normal pulses.     Heart sounds: Normal heart sounds, S1 normal and S2 normal. No murmur heard.    No friction rub. No gallop.  Pulmonary:     Effort: Pulmonary effort is normal. No respiratory distress.     Breath sounds: Normal breath sounds.  Abdominal:     General: Bowel sounds are normal.     Palpations: Abdomen is soft.     Tenderness: There is no abdominal tenderness. There is no guarding or rebound.     Hernia: No hernia is present.  Musculoskeletal:        General: No swelling.     Cervical back: Full passive range of motion without pain, normal range of motion and neck supple. No spinous process tenderness or muscular tenderness. Normal range of motion.     Right lower leg: No edema.      Left lower leg: No edema.  Skin:    General: Skin is warm and dry.     Capillary Refill: Capillary refill takes less than 2 seconds.     Findings: No ecchymosis, erythema, rash or wound.  Neurological:     General: No focal deficit present.     Mental Status: She is alert and oriented to person, place, and time.     GCS: GCS eye subscore is 4. GCS verbal subscore is 5. GCS  motor subscore is 6.     Cranial Nerves: Cranial nerves 2-12 are intact.     Sensory: Sensation is intact.     Motor: Motor function is intact.     Coordination: Coordination is intact.  Psychiatric:        Attention and Perception: Attention normal.        Mood and Affect: Mood is depressed.        Speech: Speech normal.        Behavior: Behavior normal.        Thought Content: Thought content includes suicidal ideation.     ED Results / Procedures / Treatments   Labs (all labs ordered are listed, but only abnormal results are displayed) Labs Reviewed  COMPREHENSIVE METABOLIC PANEL - Abnormal; Notable for the following components:      Result Value   Glucose, Bld 116 (*)    BUN 30 (*)    Creatinine, Ser 1.70 (*)    Calcium 10.7 (*)    GFR, Estimated 30 (*)    All other components within normal limits  CBC WITH DIFFERENTIAL/PLATELET - Abnormal; Notable for the following components:   WBC 3.8 (*)    RBC 3.30 (*)    Hemoglobin 10.1 (*)    HCT 31.7 (*)    All other components within normal limits  ACETAMINOPHEN LEVEL - Abnormal; Notable for the following components:   Acetaminophen (Tylenol), Serum <10 (*)    All other components within normal limits  SALICYLATE LEVEL - Abnormal; Notable for the following components:   Salicylate Lvl <2.1 (*)    All other components within normal limits  RESP PANEL BY RT-PCR (FLU A&B, COVID) ARPGX2  ETHANOL  RAPID URINE DRUG SCREEN, HOSP PERFORMED  URINALYSIS, ROUTINE W REFLEX MICROSCOPIC    EKG EKG Interpretation  Date/Time:  Thursday July 05 2021 00:57:51  EDT Ventricular Rate:  52 PR Interval:  196 QRS Duration: 154 QT Interval:  486 QTC Calculation: 451 R Axis:   -69 Text Interpretation: Sinus bradycardia with Premature atrial complexes Right bundle branch block Left anterior fascicular block ** Bifascicular block ** Minimal voltage criteria for LVH, may be normal variant ( R in aVL ) Septal infarct , age undetermined Abnormal ECG When compared with ECG of 29-Dec-2017 10:15, No significant change was found Confirmed by Orpah Greek [phone]) on 07/05/2021 1:36:05 AM  Radiology No results found.  Procedures Procedures    Medications Ordered in ED Medications  apixaban (ELIQUIS) tablet 5 mg (0 mg Oral Hold 07/05/21 0109)  buPROPion (WELLBUTRIN XL) 24 hr tablet 150 mg (has no administration in time range)  busPIRone (BUSPAR) tablet 15 mg (0 mg Oral Hold 07/05/21 0108)  carvedilol (COREG) tablet 12.5 mg (has no administration in time range)  donepezil (ARICEPT) tablet 10 mg (0 mg Oral Hold 07/05/21 0109)  furosemide (LASIX) tablet 40 mg (has no administration in time range)  hydrALAZINE (APRESOLINE) tablet 25 mg (has no administration in time range)  losartan (COZAAR) tablet 50 mg (has no administration in time range)  ondansetron (ZOFRAN) tablet 4 mg (has no administration in time range)  oxyCODONE-acetaminophen (PERCOCET/ROXICET) 5-325 MG per tablet 1.5 tablet (has no administration in time range)  pantoprazole (PROTONIX) EC tablet 40 mg (has no administration in time range)  pravastatin (PRAVACHOL) tablet 40 mg (has no administration in time range)  sertraline (ZOLOFT) tablet 100 mg (has no administration in time range)    ED Course/ Medical Decision Making/ A&P  Medical Decision Making Risk Prescription drug management.   Presents to the emergency department for evaluation of worsening depression.  Patient is currently treated for depression, is now experiencing suicidality with a plan.  Will  require behavioral health evaluation.  Patient is medically clear for psychiatric evaluation and treatment.        Final Clinical Impression(s) / ED Diagnoses Final diagnoses:  Suicidal ideation    Rx / DC Orders ED Discharge Orders     None         Katrese Shell, Gwenyth Allegra, MD 07/05/21 0009    Orpah Greek, MD 07/05/21 [phone]

## 2021-07-05 NOTE — ED Provider Notes (Signed)
Emergency Medicine Observation Re-evaluation Note  Susan [name] is a 81 y.o. female, seen on rounds today.  Pt initially presented to the ED for complaints of Suicidal Currently, the patient is awaiting TTS evaluation.  Physical Exam  BP (!) 147/54 (BP Location: Left Arm)   Pulse (!) 45   Temp (!) 97.3 F (36.3 C) (Oral)   Resp 17   SpO2 98%  Physical Exam General: Calm Cardiac: well perfused.  Lungs: even respirations Psych: cooperative  ED Course / MDM  EKG:EKG Interpretation  Date/Time:  Thursday July 05 2021 00:57:51 EDT Ventricular Rate:  52 PR Interval:  196 QRS Duration: 154 QT Interval:  486 QTC Calculation: 451 R Axis:   -69 Text Interpretation: Sinus bradycardia with Premature atrial complexes Right bundle branch block Left anterior fascicular block ** Bifascicular block ** Minimal voltage criteria for LVH, may be normal variant ( R in aVL ) Septal infarct , age undetermined Abnormal ECG When compared with ECG of 29-Dec-2017 10:15, No significant change was found Confirmed by Orpah Greek [phone]) on 07/05/2021 1:36:05 AM  I have reviewed the labs performed to date as well as medications administered while in observation.  Recent changes in the last 24 hours include patient medically cleared and evaluated by TTS.  Plan  Current plan is for d/c with her daughter Susan [name].   JERIS EASTERLY is not under involuntary commitment.     Margette Fast, MD 07/05/21 1036

## 2021-07-05 NOTE — BH Assessment (Signed)
[facility] Assessment Progress Note   Per Vesta Mixer, NP, this pt does not require psychiatric hospitalization at this time.  Pt is psychiatrically cleared.  Pt is reportedly a current patient at [facility].  Discharge instructions advise pt to continue treatment with them.  EDP Nanda Quinton, MD and pt's nurse, April, have been notified.  Jalene Mullet, [place] Triage Specialist [phone]

## 2021-07-05 NOTE — BH Assessment (Signed)
Comprehensive Clinical Assessment (CCA) Note  07/05/2021 Susan [name] [id number]  Discharge Disposition: Evette Georges, NP, reviewed pt's chart and information and determined pt should receive continuous assessment and be re-assessed by psychiatry in the morning. Pt is to remain at [facility]. This information was relayed to pt's team at 0443.  The patient demonstrates the following risk factors for suicide: Chronic risk factors for suicide include: psychiatric disorder of MDD, Recurrent, Moderate . Acute risk factors for suicide include: N/A. Protective factors for this patient include: positive social support, positive therapeutic relationship, responsibility to others (children, family), and hope for the future. Considering these factors, the overall suicide risk at this point appears to be moderate. Patient is not appropriate for outpatient follow up.  Therefore, a 1:2 sitter is recommended for suicide precautions.  [place] ED from 07/04/2021 in [place] CATEGORY Moderate Risk     Chief Complaint:  Chief Complaint  Patient presents with   Suicidal   Visit Diagnosis: MDD, Recurrent, Moderate  CCA Screening, Triage and Referral (STR) Susan [name] is an 81 year old patient who was brought to [facility] via her sister due to [place] with a plan to overdose on medication. Pt states, "I had a suicidal episode - it started today and got worse as the day went on. I changed medications - calcium was added last week; I'm not sure if that had something to do with it. I'm feeling like I want to go home (to where I live)."   Pt denies she's currently experiencing SI. She denies she has ever attempted to kill herself. Pt states her daughter took her to the [facility] around 5 years ago to stay for the weekend; she denies any other hospitalizations for mental health concerns. Pt shares she had a plan to "take pills and go to sleep," though she denies  she currently has a plan to kill herself. Pt denies HI, AVH, NSSIB, access to guns/weapons, engagement with the legal system, or SA.  Pt is oriented x5. Her recent/remote memory is intact with the exception of knowing when her husband died (59yr or 89yr and knowing what she earned her BA in. Pt was friendly and cooperative throughout the assessment process. Pt's insight, judgment, and impulse control is impaired at this time.  Patient Reported Information How did you hear about us[place]Family/Friend  What Is the Reason for Your Visit/Call Today? Pt states, "I had a suicidal episode - it started today and got worse as the day went on. I changed medications - calcium was added last week; I'm not sure if that had something to do with it. I'm feeling like I want to go home (to where I live)." Pt denies she's currently experiencing SI. She denies she has ever attempted to kill herself. Pt states her [facility] to stay for the [facility]; she denies any other hospitalizations for mental health concerns. Pt shares she had a plan to "take pills and go to sleep," though she denies she currently has a plan to kill herself. Pt denies HI, AVH, NSSIB, access to guns/weapons, engagement with the legal system, or SA.  How Long Has This Been Causing You Problems? <Week  What Do You Feel Would Help You the Most Today? Medication(s) (Pt would like to be d/c hom)   Have You Recently Had Any Thoughts About Hurting Yourself? Yes  Are You Planning to Commit Suicide/Harm Yourself At This  time? No   Have you Recently Had Thoughts About [place]? No  Are You Planning to Harm Someone at This Time? No  Explanation: No data recorded  Have You Used Any Alcohol or Drugs in the Past 24 Hours? No  How Long Ago Did You Use Drugs or Alcohol? No data recorded What Did You Use and How Much? No data recorded  Do You Currently Have a Therapist/Psychiatrist?  Yes  Name of Therapist/Psychiatrist: Karlene [name], therapist at [facility] - has been seeing monthly for 3-4 months. Pt's PCP/NP prescribes her mental health medication.   Have You Been Recently Discharged From Any Office Practice or Programs? No  Explanation of Discharge From Practice/Program: No data recorded    CCA Screening Triage Referral Assessment Type of Contact: Tele-Assessment  Telemedicine Service Delivery: Telemedicine service delivery: This service was provided via telemedicine using a 2-way, interactive audio and video technology  Is this Initial or Reassessment? Initial Assessment  Date Telepsych consult ordered in [facility]:  07/04/21  Time Telepsych consult ordered in [facility]:  2301  Location of Assessment: [facility] ED  Provider Location: [facility] Assessment Services   Collateral Involvement: None currently   Does Patient Have a Court Appointed Legal Guardian? No data recorded Name and Contact of Legal Guardian: No data recorded If Minor and Not Living with Parent(s), Who has Custody? N/A  Is CPS involved or ever been involved? Never  Is APS involved or ever been involved? Never   Patient Determined To Be At Risk for Harm To Self or Others Based on Review of Patient Reported Information or Presenting Complaint? Yes, for Self-Harm  Method: No data recorded Availability of Means: No data recorded Intent: No data recorded Notification Required: No data recorded Additional Information for Danger to Others Potential: No data recorded Additional Comments for Danger to Others Potential: No data recorded Are There Guns or Other Weapons in Your Home? No data recorded Types of Guns/Weapons: No data recorded Are These Weapons Safely Secured?                            No data recorded Who Could Verify You Are Able To Have These Secured: No data recorded Do You Have any Outstanding Charges, Pending Court Dates, Parole/Probation? No data recorded Contacted To Inform of Risk of  Harm To Self or Others: Family/Significant Other:    Does Patient Present under Involuntary Commitment? No  IVC Papers Initial File Date: No data recorded  [place] of Residence: [place]   Patient Currently Receiving the Following Services: Individual Therapy; Medication Management   Determination of Need: Urgent (48 hours)   Options For Referral: Outpatient Therapy; Medication Management; Other: Comment (Continuous Assessment at [facility])     CCA Biopsychosocial Patient Reported Schizophrenia/Schizoaffective Diagnosis in Past: No   Strengths: Pt cares about and has a good relationship with her children, grandchildren, and great-grandchildren. She has mental health services that she follows through with.   Mental Health Symptoms Depression:   Difficulty Concentrating; Sleep (too much or little); Weight gain/loss   Duration of Depressive symptoms:  Duration of Depressive Symptoms: Greater than two weeks   Mania:   None   Anxiety:    Worrying; Tension   Psychosis:   None   Duration of Psychotic symptoms:    Trauma:   None   Obsessions:   None   Compulsions:   None   Inattention:   None   Hyperactivity/Impulsivity:   None  Oppositional/Defiant Behaviors:   None   Emotional Irregularity:   Potentially harmful impulsivity; Mood lability   Other Mood/Personality Symptoms:   None noted    Mental Status Exam Appearance and self-care  Stature:   Average   Weight:   Average weight   Clothing:   -- (Hospital scrubs)   Grooming:   Normal   Cosmetic use:   Age appropriate   Posture/gait:   Normal   Motor activity:   Not Remarkable   Sensorium  Attention:   Normal   Concentration:   Normal   Orientation:   X5   Recall/memory:   Normal   Affect and Mood  Affect:   Appropriate   Mood:   Other (Comment) (Friendly)   Relating  Eye contact:   Normal   Facial expression:   Responsive   Attitude toward examiner:    Cooperative   Thought and Language  Speech flow:  Clear and Coherent   Thought content:   Appropriate to Mood and Circumstances   Preoccupation:   Suicide   Hallucinations:   None   Organization:  No data recorded  Transport planner of Knowledge:   Good   Intelligence:   Above Average   Abstraction:   Normal   Judgement:   Fair   Art therapist:   Realistic   Insight:   Good   Decision Making:   Normal   Social Functioning  Social Maturity:   Responsible   Social Judgement:   Normal   Stress  Stressors:   Grief/losses   Coping Ability:   Normal   Skill Deficits:   None   Supports:   Family; Friends/Service system     Religion: Religion/Spirituality Are You A Religious Person?:  (Not assessed) How Might This Affect Treatment?: Not assessed  Leisure/Recreation: Leisure / Recreation Do You Have Hobbies?: Yes Leisure and Hobbies: Pt spends time with her 3 daughters, 6 grandchildren, and 6 great-grandchildren  Exercise/Diet: Exercise/Diet Do You Exercise?:  (Not assessed) Have You Gained or Lost A Significant Amount of Weight in the Past Six Months?: Yes-Lost Number of Pounds Lost?: 13 Do You Follow a Special Diet?: No Do You Have Any Trouble Sleeping?: Yes Explanation of Sleeping Difficulties: Pt shares she has been getting to bed later adn waking up earlier   CCA Employment/Education Employment/Work Situation: Employment / Work Situation Employment Situation: Retired Social research officer, government has Been Impacted by Current Illness:  (N/A) Has Patient ever Been in Passenger transport manager?:  (Not assessed)  Education: Education Is Patient Currently Attending School?: No Last Grade Completed: 16 Did You Nutritional therapist?: Yes What Type of College Degree Do you Have?: Pt shares she has a BA but she cannot remember in what Did You Have An Individualized Education Program (IIEP): No Did You Have Any Difficulty At School?: No Patient's Education  Has Been Impacted by Current Illness: No   CCA Family/Childhood History Family and Relationship History: Family history Marital status: Widowed Widowed, when?: Either 5 or 10 years ago (pt had conflicting answers) Does patient have children?: Yes How many children?: 3 How is patient's relationship with their children?: Pt has a great relationship with her 3 daughters, 6 grandchildren, and 6 great-grandchildren  Childhood History:  Childhood History By whom was/is the patient raised?:  (Not assessed) Did patient suffer any verbal/emotional/physical/sexual abuse as a child?:  (Not assessed) Did patient suffer from severe childhood neglect?:  (Not assessed) Has patient ever been sexually abused/assaulted/raped as an adolescent or adult?:  (Not  assessed) Was the patient ever a victim of a crime or a disaster?:  (Not assessed) Witnessed domestic violence?:  (Not assessed) Has patient been affected by domestic violence as an adult?: No  Child/Adolescent Assessment:     CCA Substance Use Alcohol/Drug Use: Alcohol / Drug Use Pain Medications: See MAR Prescriptions: See MAR Over the Counter: See MAR History of alcohol / drug use?: No history of alcohol / drug abuse Longest period of sobriety (when/how long): N/A Negative Consequences of Use:  (N/A) Withdrawal Symptoms:  (N/A)                         ASAM's:  Six Dimensions of Multidimensional Assessment  Dimension 1:  Acute Intoxication and/or Withdrawal Potential:      Dimension 2:  Biomedical Conditions and Complications:      Dimension 3:  Emotional, Behavioral, or Cognitive Conditions and Complications:     Dimension 4:  Readiness to Change:     Dimension 5:  Relapse, Continued use, or Continued Problem Potential:     Dimension 6:  Recovery/Living Environment:     ASAM Severity Score:    ASAM Recommended Level of Treatment: ASAM Recommended Level of Treatment:  (N/A)   Substance use Disorder (SUD) Substance  Use Disorder (SUD)  Checklist Symptoms of Substance Use:  (N/A)  Recommendations for Services/Supports/Treatments: Recommendations for Services/Supports/Treatments Recommendations For Services/Supports/Treatments: Individual Therapy, Medication Management, Other (Comment) (Continuous Assessment at [facility])  Discharge Disposition: Evette Georges, NP, reviewed pt's chart and information and determined pt should receive continuous assessment and be re-assessed by psychiatry in the morning. Pt is to remain at [facility]. This information was relayed to pt's team at 0443.  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   Anemia of chronic disease 09/07/2018   Primary osteoarthritis of left knee 06/16/2018   Venous stasis dermatitis of left lower extremity 04/29/2018   Dementia ([place]) 03/12/2018   Recurrent cellulitis 02/12/2018   HTN (hypertension) 02/12/2018   Cellulitis and abscess of leg 12/29/2017   CKD (chronic kidney disease) stage 3, GFR 30-59 ml/min (HCC) 12/29/2017   Cellulitis of left lower extremity    Ankle edema    Sepsis ([place]) 12/21/2017   Medication management 10/11/2016   Hyperlipemia 06/18/2016   Gait abnormality 06/18/2016   Major depressive disorder, recurrent episode, severe ([place]) 11/21/2015   Edema 02/09/2015   History of CVA (cerebrovascular accident) 02/01/2015   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 ([place])    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)    Chronic low back pain 01/28/2015   Short-term memory loss 01/28/2015   Volume depletion 01/28/2015   Persistent atrial fibrillation 01/26/2015   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   TIA (transient ischemic attack) 01/24/2015   Chronic diastolic (congestive)  heart failure (HCC)    Obesity (BMI 30-39.9) 01/27/2013   Constipation 09/02/2012   H/O arthroscopy of right knee 08/06/2012   Anxiety    Depression    GERD (gastroesophageal reflux disease)    Hypertensive heart disease    OSA (obstructive sleep apnea)    Osteoarthritis of right knee    Hyperparathyroidism, primary ([place]) 04/22/2012   Chronic cough 10/02/2011     Referrals to Alternative Service(s): Referred to Alternative  Service(s):   Place:   Date:   Time:    Referred to Alternative Service(s):   Place:   Date:   Time:    Referred to Alternative Service(s):   Place:   Date:   Time:    Referred to Alternative Service(s):   Place:   Date:   Time:     Dannielle Burn, LMFT

## 2021-07-05 NOTE — ED Notes (Signed)
Patient's daughter, Geraldo Pitter, states she can come pick patient up by 6PM this evening. Given direct cell phone number for [place] to call when she is on her way.

## 2021-07-05 NOTE — Discharge Summary (Signed)
[facility] Psych ED Discharge  07/05/2021 11:27 AM SAYLOR MURRY  MRN:  [id number]  Principal Problem: Major depressive disorder Discharge Diagnoses: Principal Problem:   Major depressive disorder Active Problems:   Suicidal ideation  Clinical Impression:  Final diagnoses:  Suicidal ideation  Moderate episode of recurrent major depressive disorder (HCC)   Subjective:  Pt is seen in [facility] for face to face evaluation. She originally presented to ED by her sister due to experiencing SI with plan to overdose on medications. When speaking to her about this, she becomes tearful and states "I don't know what I was thinking. I would never hurt myself and I would never do that to my kids." Pt denies any previous suicide attempts. Pt states she has struggled with depression for many years, and for the past few weeks she has been feeling more depressed. She stated in the past if she ever had suicidal thoughts she would think about her kids and the thoughts would go away. She states this time she was having difficulty removing the thoughts which is why she presented to ED. Today, she is hoping to return home and denies current SI or HI. Denies any previous suicide attempts or SIB. Denies any auditory or visual hallucinations. Denies any alcohol or substance use. She states is able to contract for safety, and states she wants to continue to live to be there for her kids, her grandchildren, and her great grandchildren. She follows up with [facility] and PCP regularly. She currently lives at [place] [facility] facility called [facility] and states she loves living there/feels safe. She endorses being compliant with her medications. When I ask her what she will do in the future if she starts to feel depressed and begins having suicidal thoughts again she stated she would reach out to her children and try to distract her mind like playing with her computer games or going outside for fresh air. We also discussed other community  resources such as [facility] if she finds herself in crisis again.   I spoke with her daughter, Geraldo Pitter, at [phone]. She is concerned that her mother may need to transition out of independent living and into an ALF so they are going to start looking into that option. She is worried that her mother is forgetting to take her medications at times and the ALF would be beneficial to ensure she is receiving medications. She mentions that her and the siblings visit her often and that she has good social support, and they try to make sure she attends OP appointments. [name] is agreeable for discharge.  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)    Family history of anesthesia complication    "some wake up during OR; some are hard to wake up; some both" (07/29/2012)   GERD (gastroesophageal reflux disease)    Hemiparesis affecting dominant side  as late effect of stroke ([place])    History of CVA (cerebrovascular accident) 02/01/2015   History of stomach ulcers 1980's   Hyperlipidemia    Hyperparathyroidism, primary ([place]) 04/22/2012   Hypertensive heart disease    Incontinent of urine    wears pads   Major depressive disorder, recurrent episode, severe ([place]) 11/21/2015   OSA on CPAP    Osteoarthritis of right knee    Pedal edema    Persistent atrial fibrillation (HCC)    CHADS2VASC score is 7 -  on chronic anticoagulation with Apixaban   Spinal stenosis    Umbilical hernia    "unrepaired" (07/29/2012)   Varicose veins    "BLE" (07/29/2012)   Vascular dementia ([place])    Venous stasis dermatitis of left lower extremity 04/29/2018    Past Surgical History:  Procedure Laterality Date   CATARACT EXTRACTION     COLONOSCOPY     ENDOVENOUS ABLATION SAPHENOUS VEIN W/ LASER Left 06/24/2019   endovenous laser ablation left greater saphenous vein by Gae Gallop MD    IUD REMOVAL  1980's   PARATHYROIDECTOMY N/A 06/16/2012   Procedure: NECK EXPLORATION AND LEFT SUPERIOR PARATHYROIDECTOMY;  Surgeon: Earnstine Regal, MD;  Location: [facility];  Service: General;  Laterality: N/A;   TOTAL KNEE ARTHROPLASTY Right 07/29/2012   TOTAL KNEE ARTHROPLASTY Right 07/29/2012   Procedure: TOTAL KNEE ARTHROPLASTY;  Surgeon: Ninetta Lights, MD;  Location: [place];  Service: Orthopedics;  Laterality: Right;   TOTAL KNEE ARTHROPLASTY Left 12/13/2019   Procedure: LEFT TOTAL KNEE ARTHROPLASTY;  Surgeon: Leandrew Koyanagi, MD;  Location: [place];  Service: Orthopedics;  Laterality: Left;   Family History:  Family History  Problem Relation Age of Onset   Hypertension Mother        deceased   Stroke Mother    Breast cancer Mother    Lung cancer Father        deceased   Diabetes Daughter    Hypertension Daughter    Family Psychiatric  History: none Social History:  Social History   Substance and Sexual Activity  Alcohol Use No     Social History   Substance and Sexual Activity  Drug Use No    Social History   Socioeconomic History   Marital status: Widowed    Spouse name: Not on file   Number of children: Not on file   Years of education: Not on file   Highest education level: Not on file  Occupational History   Occupation: retired    Fish farm manager: RETIRED  Tobacco Use   Smoking status: Former    Packs/day: 1.00    Years: 30.00    Total pack years: 30.00    Types: Cigarettes    Quit date: 01/07/1978     Years since quitting: 43.5   Smokeless tobacco: Never  Vaping Use   Vaping Use: Never used  Substance and Sexual Activity   Alcohol use: No   Drug use: No   Sexual activity: Not Currently    Comment: intercourse age 67,less than 5 secxual partners,  Other Topics Concern   Not on file  Social History Narrative   Lives at home alone   Right-handed   Caffeine: 1 cup of coffee per day         Diet:      Caffeine:      Married, if yes what year:       Do you live in a house, apartment, assisted living, [place], trailer,  ect:      Pets:      Current/Past profession:      Exercise:         Living Will: Yes   DNR: No   POA/HPOA: Yes      Functional Status:   Do you have difficulty bathing or dressing yourself? Yes   Do you have difficulty preparing food or eating? No   Do you have difficulty managing your medications? Yes   Do you have difficulty managing your finances? Yes   Do you have difficulty affording your medications? Yes   Social Determinants of Health   Financial Resource Strain: Low Risk  (11/17/2017)   Overall Financial Resource Strain (CARDIA)    Difficulty of Paying Living Expenses: Not hard at all  Food Insecurity: No Food Insecurity (11/17/2017)   Hunger Vital Sign    Worried About Running Out of Food in the Last Year: Never true    Ran Out of Food in the Last Year: Never true  Transportation Needs: No Transportation Needs (11/17/2017)   PRAPARE - Hydrologist (Medical): No    Lack of Transportation (Non-Medical): No  Physical Activity: Insufficiently Active (11/17/2017)   Exercise Vital Sign    Days of Exercise per Week: 3 days    Minutes of Exercise per Session: 30 min  Stress: Stress Concern Present (11/17/2017)   [place]    Feeling of Stress : Rather much  Social Connections: Somewhat Isolated (11/17/2017)   Social Connection and Isolation Panel  [NHANES]    Frequency of Communication with Friends and Family: More than three times a week    Frequency of Social Gatherings with Friends and Family: More than three times a week    Attends Religious Services: More than 4 times per year    Active Member of [employer] or Organizations: No    Attends Archivist Meetings: Never    Marital Status: Widowed    Tobacco Cessation:  N/A, patient does not currently use tobacco products  Current Medications: Current Facility-Administered Medications  Medication Dose Route Frequency Provider Last Rate Last Admin   apixaban (ELIQUIS) tablet 5 mg  5 mg Oral BID Pollina, Gwenyth Allegra, MD   5 mg at 07/05/21 1014   busPIRone (BUSPAR) tablet 15 mg  15 mg Oral BID Orpah Greek, MD   15 mg at 07/05/21 1015   carvedilol (COREG) tablet 12.5 mg  12.5 mg Oral BID WC Pollina, Gwenyth Allegra, MD       donepezil (ARICEPT) tablet 10 mg  10 mg Oral QHS Pollina, Gwenyth Allegra, MD       furosemide (LASIX) tablet 40 mg  40 mg Oral Daily Orpah Greek, MD   40 mg at 07/05/21 1014   hydrALAZINE (APRESOLINE) tablet 25 mg  25 mg Oral TID Orpah Greek, MD   25 mg at 07/05/21 1014   losartan (COZAAR) tablet 50 mg  50 mg Oral Daily Orpah Greek, MD   50 mg at 07/05/21 1014   ondansetron (ZOFRAN) tablet 4 mg  4 mg Oral Q8H PRN Pollina, Gwenyth Allegra, MD       oxyCODONE-acetaminophen (PERCOCET/ROXICET) 5-325 MG per tablet 1.5 tablet  1.5 tablet Oral Q6H PRN Pollina, Gwenyth Allegra, MD       pantoprazole (PROTONIX) EC tablet 40 mg  40 mg Oral Daily Orpah Greek, MD   40 mg at 07/05/21 1014   pravastatin (  PRAVACHOL) tablet 40 mg  40 mg Oral Daily Pollina, Gwenyth Allegra, MD   40 mg at 07/05/21 1014   sertraline (ZOLOFT) tablet 100 mg  100 mg Oral Daily Orpah Greek, MD   100 mg at 07/05/21 1014   Current Outpatient Medications  Medication Sig Dispense Refill   apixaban (ELIQUIS) 5 MG TABS tablet Take 1 tablet (5 mg  total) by mouth every 12 (twelve) hours. 60 tablet 4   busPIRone (BUSPAR) 15 MG tablet Take 15 mg by mouth 2 (two) times daily.     Calcium Carb-Cholecalciferol 600-10 MG-MCG TABS Take 1 tablet by mouth 2 (two) times daily.     carvedilol (COREG) 12.5 MG tablet TAKE 1 TABLET BY MOUTH 2 TIMES DAILY WITH MEALS (Patient taking differently: Take 12.5 mg by mouth 2 (two) times daily with a meal.) 60 tablet 5   cetirizine (ZYRTEC) 10 MG tablet Take 10 mg by mouth daily.     donepezil (ARICEPT) 10 MG tablet Take 1 tablet (10 mg total) by mouth at bedtime. For memory loss 90 tablet 1   FEROSUL 325 (65 Fe) MG tablet Take 325 mg by mouth 2 (two) times daily.     furosemide (LASIX) 40 MG tablet Take 40 mg by mouth daily.     hydrALAZINE (APRESOLINE) 25 MG tablet TAKE 1 TABLET BY MOUTH 3 TIMES DAILY (Patient taking differently: Take 25 mg by mouth 3 (three) times daily.) 90 tablet 5   losartan (COZAAR) 50 MG tablet TAKE 1 TABLET BY MOUTH EVERY DAY (Patient taking differently: Take 50 mg by mouth daily.) 30 tablet 3   Multiple Vitamin (GNP ESSENTIAL ONE DAILY) TABS TAKE 1 TABLET BY MOUTH EVERY DAY (Patient taking differently: Take 1 tablet by mouth daily.) 30 tablet 11   pravastatin (PRAVACHOL) 40 MG tablet TAKE 1 TABLET BY MOUTH EVERY DAY (Patient taking differently: Take 40 mg by mouth daily.) 30 tablet 5   sertraline (ZOLOFT) 100 MG tablet Take 100 mg by mouth daily.     albuterol (VENTOLIN HFA) 108 (90 Base) MCG/ACT inhaler Inhale into the lungs.     azelastine (ASTELIN) 0.1 % nasal spray Place 1 spray into both nostrils 2 (two) times daily as needed for rhinitis. Use in each nostril as directed (Patient not taking: Reported on 07/05/2021)     buPROPion (WELLBUTRIN XL) 150 MG 24 hr tablet Take 150 mg by mouth daily. (Patient not taking: Reported on 07/05/2021)     diclofenac sodium (VOLTAREN) 1 % GEL Apply 2 g topically 4 (four) times daily. (Patient not taking: Reported on 07/05/2021) 1 Tube 5   docusate  sodium (COLACE) 100 MG capsule Take 1 capsule (100 mg total) by mouth daily as needed. (Patient not taking: Reported on 07/05/2021) 30 capsule 2   FLORASTOR 250 MG capsule TAKE 1 CAPSULE BY MOUTH 2 TIMES DAILY (Patient not taking: Reported on 07/05/2021) 60 capsule 5   LORazepam (ATIVAN) 1 MG tablet Take 1 tablet 30 minutes prior to leaving house on day of procedure.  Bring second tablet to office on day of procedure. 2 tablet 0   Menthol, Topical Analgesic, (BIOFREEZE) 4 % GEL Apply 3 oz topically 3 (three) times daily. For knee pain (Patient taking differently: Apply 1 application topically 3 (three) times daily as needed (knee pain).) 1 Tube 3   methocarbamol (ROBAXIN) 500 MG tablet Take 1 tablet (500 mg total) by mouth 2 (two) times daily as needed. 20 tablet 0   ondansetron (ZOFRAN) 4 MG tablet  Take 1 tablet (4 mg total) by mouth every 8 (eight) hours as needed for nausea or vomiting. 40 tablet 0   oxyCODONE-acetaminophen (PERCOCET) 7.5-325 MG tablet Take 1-2 tabs po every 6 hours prn pain 40 tablet 0   pantoprazole (PROTONIX) 40 MG tablet TAKE 1 TABLET BY MOUTH EVERY DAY (Patient taking differently: Take 40 mg by mouth daily.) 90 tablet 0   SYMBICORT 80-4.5 MCG/ACT inhaler Inhale 2 puffs into the lungs.     PTA Medications: (Not in a hospital admission)   [place] Scale:  [place] ED from 07/04/2021 in [place] Moderate Risk       Psychiatric Specialty Exam: Presentation  General Appearance: Appropriate for Environment  Eye Contact:Good  Speech:Clear and Coherent  Speech Volume:Normal  Handedness:No data recorded  Mood and Affect  Mood:Anxious  Affect:Congruent   Thought Process  Thought Processes:Coherent  Descriptions of Associations:Intact  Orientation:Full (Time, Place and Person)  Thought Content:Logical  History of Schizophrenia/Schizoaffective disorder:No  Duration of Psychotic Symptoms:No  data recorded Hallucinations:Hallucinations: None  Ideas of Reference:None  Suicidal Thoughts:Suicidal Thoughts: No  Homicidal Thoughts:Homicidal Thoughts: No   Sensorium  Memory:Immediate Fair  Judgment:Fair  Insight:Fair   Executive Functions  Concentration:Good  Attention Span:Fair  [place]  Language:Good   Psychomotor Activity  Psychomotor Activity:Psychomotor Activity: Other (comment) (walker utilized)   Assets  Assets:Communication Skills; Desire for Improvement; Housing; Leisure Time; Resilience; Social Support   Sleep  Sleep:Sleep: Good    Physical Exam: Physical Exam Neurological:     Mental Status: She is alert.  Psychiatric:        Mood and Affect: Mood is anxious.        Behavior: Behavior is cooperative.        Thought Content: Thought content normal.    Review of Systems  Musculoskeletal:        Uses walker  Psychiatric/Behavioral:  Negative for hallucinations and suicidal ideas. The patient is nervous/anxious.   All other systems reviewed and are negative.  Blood pressure (!) 147/54, pulse (!) 45, temperature (!) 97.3 F (36.3 C), temperature source Oral, resp. rate 17, SpO2 98 %. There is no height or weight on file to calculate BMI.   Demographic Factors:  Age 21 or older and Living alone  Loss Factors: Decline in physical health  Historical Factors: Hx of SI, no previous SA  Risk Reduction Factors:   Sense of responsibility to family, Religious beliefs about death, Positive social support, Positive therapeutic relationship, and Positive coping skills or problem solving skills  Continued Clinical Symptoms:  Depression:   Anhedonia Hopelessness Previous Psychiatric Diagnoses and Treatments  Cognitive Features That Contribute To Risk:  None    Suicide Risk:  Mild:  Suicidal ideation of limited frequency, intensity, duration, and specificity.  There are no identifiable plans, no associated  intent, mild dysphoria and related symptoms, good self-control (both objective and subjective assessment), few other risk factors, and identifiable protective factors, including available and accessible social support.    Plan Of Care/Follow-up recommendations:  Other:  Continue BH treatment and follow up care at [place] presenting to [facility] if in crisis again, due to patient having thoughts of not wanting to return to [facility] again.  Medical Decision Making: Patient does not meet criteria for IVC or inpatient psychiatric treatment at this time. No medication changes at this time. Pt is able to contract for safety, and is hopeful  to return home today.  Problem 1: MDD - Continue med management at [facility] home medications, no changes at this time -Resources for [facility] and therapy services in [facility] - Information regarding [facility] in [facility]   Disposition: Discharge Patient does not meet criteria for IVC or inpatient psychiatric treatment at this time. Daughter Geraldo Pitter contacted and agreeable to discharge. RN, LCSW, and EDP notified of disposition.   Vesta Mixer, NP 07/05/2021, 11:27 AM

## 2021-07-08 DIAGNOSIS — Z01810 Encounter for preprocedural cardiovascular examination: Secondary | ICD-10-CM | POA: Insufficient documentation

## 2021-07-08 NOTE — Progress Notes (Signed)
Cardiology Office Note:    Date:  07/09/2021   ID:  Susan [name], DOB 04-Oct-1940, MRN [id number]  PCP:  Sonia Side., [facility] Prov[facility] Cardiologist:  Fransico Him, MD    Referring MD: Sonia Side., FNP   Chief Complaint:  Surgical Clearance    Patient Profile: (HFpEF) heart failure with preserved ejection fraction  Persistent atrial fibrillation  Right Bundle Branch Block  Non-ischemic cardiomyopathy  EF in 2017 30-35 >> improved to normal Echocardiogram 08/2019: EF 55-60 Hypertension  Hyperlipidemia  Chronic kidney disease  OSA on CPAP Chronic venous insufficiency  Followed by Dr. Scot Dock  GERD Hx of CVA  Depression  Hyperparathyroidism   Prior CV Studies: Echocardiogram 09/03/19 EF 55-60, no RWMA, mild LVH, Gr 1 DD, normal RVSF, normal PASP, mod LAE, trivial MR  Carotid US 01/26/15 RICA 1-39  Myoview 04/13/13 EF 53, no ischemia   History of Present Illness:   Susan [name] is a 81 y.o. female with the above problem list.  She was last seen in Aug 2021 by Ermalinda Barrios, PA-C for surgical clearance.  She returns again for surgical clearance.  She needs 3 teeth extracted and will need to hold her Eliquis for this.  Our PharmD has recommended holding Eliquis for 1 day. If longer hold needed, we will need to get further input from Dr. Radford Pax, her cardiologist.  Of note, she needs a CBC for f/u today.  She is here with her daughter and granddaughter.  Of note, she went to the emergency room for symptoms of depression on June 28 and had a CBC today.  Her platelet count was normal.  I reviewed this with pharmacy and she does not need further testing.  She lives at assisted living.  She does walk with a walker.  She has not had chest pain, shortness of breath, syncope, orthopnea, significant leg edema.        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)    Family history of anesthesia complication    "some wake up during OR; some are hard to wake up; some both" (07/29/2012)   GERD (gastroesophageal reflux disease)    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 (HCC)    History of CVA (cerebrovascular accident) 02/01/2015   History of stomach ulcers 1980's   Hyperlipidemia    Hyperparathyroidism, primary ([place]) 04/22/2012   Hypertensive heart disease    Incontinent of urine    wears pads   Major depressive disorder, recurrent episode, 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  apixaban (ELIQUIS) 5 MG TABS tablet Take 1 tablet (5 mg total) by mouth every 12 (twelve) hours.   azelastine (ASTELIN) 0.1 % nasal spray Place 1 spray into both nostrils 2 (two) times daily as needed for rhinitis. Use in each nostril as directed   busPIRone (BUSPAR) 15 MG tablet Take 15 mg by mouth 2 (two) times daily.   Calcium Carb-Cholecalciferol 600-10 MG-MCG TABS Take 1 tablet by mouth 2 (two) times daily.   carvedilol (COREG) 12.5 MG tablet TAKE 1 TABLET BY MOUTH 2 TIMES DAILY WITH MEALS (Patient taking differently: Take 12.5 mg by mouth 2 (two) times daily with a meal.)   cetirizine (ZYRTEC) 10 MG tablet Take 10 mg by mouth daily.   diclofenac sodium (VOLTAREN) 1  % GEL Apply 2 g topically 4 (four) times daily.   docusate sodium (COLACE) 100 MG capsule Take 1 capsule (100 mg total) by mouth daily as needed.   donepezil (ARICEPT) 10 MG tablet Take 1 tablet (10 mg total) by mouth at bedtime. For memory loss   FEROSUL 325 (65 Fe) MG tablet Take 325 mg by mouth 2 (two) times daily.   furosemide (LASIX) 40 MG tablet Take 40 mg by mouth daily.   hydrALAZINE (APRESOLINE) 25 MG tablet TAKE 1 TABLET BY MOUTH 3 TIMES DAILY (Patient taking differently: Take 25 mg by mouth 3 (three) times daily.)   losartan (COZAAR) 50 MG tablet TAKE 1 TABLET BY MOUTH EVERY DAY (Patient taking differently: Take 50 mg by mouth daily.)   Menthol, Topical Analgesic, (BIOFREEZE) 4 % GEL Apply 3 oz topically 3 (three) times daily. For knee pain (Patient taking differently: Apply 1 application  topically 3 (three) times daily as needed (knee pain).)   methocarbamol (ROBAXIN) 500 MG tablet Take 1 tablet (500 mg total) by mouth 2 (two) times daily as needed.   Multiple Vitamin (GNP ESSENTIAL ONE DAILY) TABS TAKE 1 TABLET BY MOUTH EVERY DAY (Patient taking differently: Take 1 tablet by mouth daily.)   pravastatin (PRAVACHOL) 40 MG tablet TAKE 1 TABLET BY MOUTH EVERY DAY (Patient taking differently: Take 40 mg by mouth daily.)   sertraline (ZOLOFT) 100 MG tablet Take 100 mg by mouth daily.   SYMBICORT 80-4.5 MCG/ACT inhaler Inhale 2 puffs into the lungs.   [DISCONTINUED] buPROPion (WELLBUTRIN XL) 150 MG 24 hr tablet Take 150 mg by mouth daily.   [DISCONTINUED] FLORASTOR 250 MG capsule TAKE 1 CAPSULE BY MOUTH 2 TIMES DAILY   [DISCONTINUED] LORazepam (ATIVAN) 1 MG tablet Take 1 tablet 30 minutes prior to leaving house on day of procedure.  Bring second tablet to office on day of procedure.   [DISCONTINUED] ondansetron (ZOFRAN) 4 MG tablet Take 1 tablet (4 mg total) by mouth every 8 (eight) hours as needed for nausea or vomiting.   [DISCONTINUED] oxyCODONE-acetaminophen (PERCOCET) 7.5-325 MG tablet  Take 1-2 tabs po every 6 hours prn pain   [DISCONTINUED] pantoprazole (PROTONIX) 40 MG tablet TAKE 1 TABLET BY MOUTH EVERY DAY (Patient taking differently: Take 40 mg by mouth daily.)    Allergies:   Ace inhibitors, Lipitor [atorvastatin], Simvastatin, and Latex   Social History   Tobacco Use   Smoking status: Former    Packs/day: 1.00    Years: 30.00    Total pack years: 30.00    Types: Cigarettes    Quit date: 01/07/1978    Years since quitting: 43.5   Smokeless tobacco: Never  Vaping Use   Vaping Use: Never used  Substance Use Topics  Alcohol use: No   Drug use: No    Family Hx: The patient's family history includes Breast cancer in her mother; Diabetes in her daughter; Hypertension in her daughter and mother; Lung cancer in her father; Stroke in her mother.  Review of Systems  Gastrointestinal:  Negative for hematochezia and melena.  Genitourinary:  Negative for hematuria.     EKGs/Labs/Other Test Reviewed:    EKG:  EKG is  ordered today.  The ekg ordered today demonstrates sinus bradycardia, HR 57, left anterior fascicular block, right bundle branch block, QTc 459, no change from prior tracing  Recent Labs: 07/04/2021: ALT 18; BUN 30; Creatinine, Ser 1.70; Hemoglobin 10.1; Platelets 236; Potassium 4.0; Sodium 141   Recent Lipid Panel No results for input(s): "CHOL", "TRIG", "HDL", "VLDL", "LDLCALC", "LDLDIRECT" in the last 8760 hours.   Risk Assessment/Calculations/Metrics:    CHA2DS2-VASc Score = 7   This indicates a 11.2% annual risk of stroke. The patient's score is based upon: CHF History: 1 HTN History: 1 Diabetes History: 0 Stroke History: 2 Vascular Disease History: 0 Age Score: 2 Gender Score: 1             Physical Exam:    VS:  BP (!) 138/44   Pulse (!) 57   Ht '5\' 1"'$  (1.549 m)   Wt 158 lb 6.4 oz (71.8 kg)   SpO2 96%   BMI 29.93 kg/m     Wt Readings from Last 3 Encounters:  07/09/21 158 lb 6.4 oz (71.8 kg)  09/28/20 162 lb (73.5 kg)   12/13/19 166 lb 10.7 oz (75.6 kg)    Constitutional:      Appearance: Healthy appearance. Not in distress.  Neck:     Vascular: No JVR. JVD normal.  Pulmonary:     Effort: Pulmonary effort is normal.     Breath sounds: No wheezing. No rales.  Cardiovascular:     Normal rate. Regular rhythm. Normal S1. Normal S2.      Murmurs: There is a grade 1/6 systolic murmur at the URSB.  Edema:    Peripheral edema absent.  Abdominal:     Palpations: Abdomen is soft.  Skin:    General: Skin is warm and dry.  Neurological:     Mental Status: Alert and oriented to person, place and time.         ASSESSMENT & PLAN:   Preoperative cardiovascular examination Dental extractions are low risk procedure do not generally require cardiac clearance.  Her procedure will be done in the office.  If she needs some type of conscious sedation such as nitrous oxide, this is acceptable.  Given her prior history of stroke, she should not hold Eliquis for more than 1 day.  If she needs to hold it more than 1 day, the dental office should call our office for further instructions. Okay to hold Eliquis x1 day then resume day of procedure  Persistent atrial fibrillation She is currently in sinus rhythm.  She is tolerating anticoagulation well.  Recent creatinine was 1.7.  She has had creatinines in the 1.4-1.5 range.  This was from 2021.  I will repeat a BMET in 2 weeks.  If her creatinine remains 1.5 or higher, we will need to decrease her dose of Eliquis to 2.5 mg twice daily as she is over 54 years old.  Recent hemoglobin was stable.  (HFpEF) heart failure with preserved ejection fraction (HCC) Overall, volume status appears to be stable.  Echocardiogram in 2021 demonstrated EF 55-60.  Continue current dose of furosemide.  Essential hypertension The patient's blood pressure is controlled on her current regimen.  Continue current therapy.   CKD (chronic kidney disease) stage 3, GFR 30-59 ml/min (HCC) As noted,  creatinine was elevated recently.  Repeat BMET in 2 weeks            Dispo:  Return in about 6 months (around 01/09/2022) for Routine Follow Up with Dr. Radford Pax.   Medication Adjustments/Labs and Tests Ordered: Current medicines are reviewed at length with the patient today.  Concerns regarding medicines are outlined above.  Tests Ordered: Orders Placed This Encounter  Procedures   Basic metabolic panel   EKG 01-VCBS   Medication Changes: No orders of the defined types were placed in this encounter.  Signed, Richardson Dopp, PA-C  07/09/2021 10:44 AM    [facility] [address], [place], [place]  [address] Phone: [phone]; Fax: [phone]

## 2021-07-09 ENCOUNTER — Ambulatory Visit: Payer: Medicare Other | Admitting: Physician Assistant

## 2021-07-09 ENCOUNTER — Encounter: Payer: Self-pay | Admitting: Physician Assistant

## 2021-07-09 VITALS — BP 138/44 | HR 57 | Ht 61.0 in | Wt 158.4 lb

## 2021-07-09 DIAGNOSIS — Z01810 Encounter for preprocedural cardiovascular examination: Secondary | ICD-10-CM

## 2021-07-09 DIAGNOSIS — N183 Chronic kidney disease, stage 3 unspecified: Secondary | ICD-10-CM

## 2021-07-09 DIAGNOSIS — I10 Essential (primary) hypertension: Secondary | ICD-10-CM

## 2021-07-09 DIAGNOSIS — I4819 Other persistent atrial fibrillation: Secondary | ICD-10-CM

## 2021-07-09 DIAGNOSIS — I5032 Chronic diastolic (congestive) heart failure: Secondary | ICD-10-CM | POA: Diagnosis not present

## 2021-07-09 NOTE — Assessment & Plan Note (Signed)
She is currently in sinus rhythm.  She is tolerating anticoagulation well.  Recent creatinine was 1.7.  She has had creatinines in the 1.4-1.5 range.  This was from 2021.  I will repeat a BMET in 2 weeks.  If her creatinine remains 1.5 or higher, we will need to decrease her dose of Eliquis to 2.5 mg twice daily as she is over 81 years old.  Recent hemoglobin was stable.

## 2021-07-09 NOTE — Assessment & Plan Note (Signed)
As noted, creatinine was elevated recently.  Repeat BMET in 2 weeks

## 2021-07-09 NOTE — Assessment & Plan Note (Signed)
Dental extractions are low risk procedure do not generally require cardiac clearance.  Her procedure will be done in the office.  If she needs some type of conscious sedation such as nitrous oxide, this is acceptable.  Given her prior history of stroke, she should not hold Eliquis for more than 1 day.  If she needs to hold it more than 1 day, the dental office should call our office for further instructions.  Okay to hold Eliquis x1 day then resume day of procedure

## 2021-07-09 NOTE — Telephone Encounter (Signed)
Notes sent to surgeon. Richardson Dopp, PA-C    07/09/2021 4:57 PM

## 2021-07-09 NOTE — Patient Instructions (Signed)
Medication Instructions:  Your physician recommends that you continue on your current medications as directed. Please refer to the Current Medication list given to you today.  *If you need a refill on your cardiac medications before your next appointment, please call your pharmacy*   Lab Work: 07/25/21:  Come anytime after 7:15 for BMET If you have labs (blood work) drawn today and your tests are completely normal, you will receive your results only by: [place] (if you have MyChart) OR A paper copy in the mail If you have any lab test that is abnormal or we need to change your treatment, we will call you to review the results.   Testing/Procedures: None ordered   Follow-Up: At [facility], you and your health needs are our priority.  As part of our continuing mission to provide you with exceptional heart care, we have created designated Provider Care Teams.  These Care Teams include your primary Cardiologist (physician) and Advanced Practice Providers (APPs -  Physician Assistants and Nurse Practitioners) who all work together to provide you with the care you need, when you need it.  We recommend signing up for the patient portal called "MyChart".  Sign up information is provided on this After Visit Summary.  MyChart is used to connect with patients for Virtual Visits (Telemedicine).  Patients are able to view lab/test results, encounter notes, upcoming appointments, etc.  Non-urgent messages can be sent to your provider as well.   To learn more about what you can do with MyChart, go to [email or web address].    Your next appointment:   6 month(s)  01/11/22 arrive at 9:25  The format for your next appointment:   In Person  Provider:   Fransico Him, MD     Other Instructions   Important Information About Sugar

## 2021-07-09 NOTE — Assessment & Plan Note (Signed)
Overall, volume status appears to be stable.  Echocardiogram in 2021 demonstrated EF 55-60.  Continue current dose of furosemide.

## 2021-07-09 NOTE — Assessment & Plan Note (Signed)
The patient's blood pressure is controlled on her current regimen.  Continue current therapy.   

## 2021-07-09 NOTE — Telephone Encounter (Signed)
CBC results back. OK to hold Eliquis for 1 day for procedure

## 2021-07-16 NOTE — Telephone Encounter (Signed)
Denton Ar is calling from [facility] stating they have not received this clearance. Confirmed fax number listed in clearance is correct, she is requesting it be resent. Please advise.

## 2021-07-16 NOTE — Telephone Encounter (Signed)
I will personally myself re-fax the clearance notes manually.

## 2021-07-25 ENCOUNTER — Other Ambulatory Visit: Payer: Medicare Other

## 2021-07-25 DIAGNOSIS — I4819 Other persistent atrial fibrillation: Secondary | ICD-10-CM

## 2021-07-25 DIAGNOSIS — I10 Essential (primary) hypertension: Secondary | ICD-10-CM

## 2021-07-25 DIAGNOSIS — N183 Chronic kidney disease, stage 3 unspecified: Secondary | ICD-10-CM

## 2021-07-25 DIAGNOSIS — I5032 Chronic diastolic (congestive) heart failure: Secondary | ICD-10-CM

## 2021-07-25 DIAGNOSIS — Z01810 Encounter for preprocedural cardiovascular examination: Secondary | ICD-10-CM

## 2021-07-25 LAB — BASIC METABOLIC PANEL
BUN/Creatinine Ratio: 17 (ref 12–28)
BUN: 29 mg/dL — ABNORMAL HIGH (ref 8–27)
CO2: 22 mmol/L (ref 20–29)
Calcium: 10.1 mg/dL (ref 8.7–10.3)
Chloride: 108 mmol/L — ABNORMAL HIGH (ref 96–106)
Creatinine, Ser: 1.69 mg/dL — ABNORMAL HIGH (ref 0.57–1.00)
Glucose: 100 mg/dL — ABNORMAL HIGH (ref 70–99)
Potassium: 4.1 mmol/L (ref 3.5–5.2)
Sodium: 143 mmol/L (ref 134–144)
eGFR: 30 mL/min/{1.73_m2} — ABNORMAL LOW (ref >59)

## 2021-07-26 ENCOUNTER — Telehealth: Payer: Self-pay | Admitting: *Deleted

## 2021-07-26 ENCOUNTER — Telehealth: Payer: Self-pay | Admitting: Cardiology

## 2021-07-26 DIAGNOSIS — R7989 Other specified abnormal findings of blood chemistry: Secondary | ICD-10-CM

## 2021-07-26 DIAGNOSIS — Z79899 Other long term (current) drug therapy: Secondary | ICD-10-CM

## 2021-07-26 MED ORDER — APIXABAN 2.5 MG PO TABS: 2.5000 mg | ORAL_TABLET | Freq: Two times a day (BID) | ORAL | 6 refills | Status: DC

## 2021-07-26 NOTE — Telephone Encounter (Signed)
Informed patient of results and verbal understanding expressed. New Eliquis Rx sent to pharmacy. Scheduled for follow up BMET on 10/20. Patient verbalized understanding and agreeable to plan.

## 2021-07-26 NOTE — Telephone Encounter (Signed)
[place] spoke with Verdis Frederickson who sent a message to Dr. Thompson Caul nurse to call our office.  Called pt who reports does not check BP/HR on a regular basis.  Denies dizziness, palpitations, and feelings as if will pass out.  Advised pt to monitor BP/HR daily until Monday and call in readings.   Sheka with [facility] called in.  Expressed that Dr. Thompson Caul office received a call from [place] that pt resting HR was 46.  Pt came into the office for HR check and was noted to be 52 today.  Dr. Tamala Julian would like to decrease carvedilol dose down to QD instead of BID.  Will send to Dr. Radford Pax to address.

## 2021-07-26 NOTE — Telephone Encounter (Signed)
-----   Message from Liliane Shi, [place] sent at 07/26/2021  8:06 AM EDT ----- Creatinine stable. K+ normal.  Creatinine has remained 1.5 or higher therefore her dose of Eliquis needs to be adjusted.  PLAN:  -Decrease Eliquis to 2.5 mg twice daily.  -BMET 3 mos Richardson Dopp, [place]    07/26/2021 8:03 AM

## 2021-07-26 NOTE — Telephone Encounter (Signed)
Pt c/o medication issue:  1. Name of Medication: carvedilol (COREG) 12.5 MG tablet  2. How are you currently taking this medication (dosage and times per day)? 2x daily  3. Are you having a reaction (difficulty breathing--STAT)? no  4. What is your medication issue? Per RN, Dr. Dustin Folks thinks that the patient should be taking this medication 1x daily. Her HR in clinic today was 10. He wants to know if Dr. Radford Pax will agree to this. Call back number is [phone]. This number is to the call center, you can give the answer to the representative and they will relay the message to Dr. Tamala Julian and his nurse.

## 2021-07-27 ENCOUNTER — Other Ambulatory Visit (HOSPITAL_COMMUNITY): Payer: Self-pay | Admitting: Family

## 2021-07-27 ENCOUNTER — Other Ambulatory Visit: Payer: Self-pay | Admitting: Family

## 2021-07-27 DIAGNOSIS — K469 Unspecified abdominal hernia without obstruction or gangrene: Secondary | ICD-10-CM

## 2021-07-27 NOTE — Telephone Encounter (Signed)
Spoke with Verdis Frederickson at [facility] and gave her recommendations from Dr. Radford Pax in regards to carvedilol dose. She is going to send a message to Dustin Folks, NP and his nurse.

## 2021-07-31 ENCOUNTER — Telehealth: Payer: Self-pay | Admitting: Cardiology

## 2021-07-31 MED ORDER — CARVEDILOL 6.25 MG PO TABS: 6.2500 mg | ORAL_TABLET | Freq: Two times a day (BID) | ORAL | 3 refills | Status: DC

## 2021-07-31 NOTE — Telephone Encounter (Signed)
Left a message to call back.

## 2021-07-31 NOTE — Telephone Encounter (Signed)
Called pt in regards to cutting medications in half.  Pt reports a nurse called her and told her to take half dose of carvedilol and she thinks losartan.  Advised pt I do not see that carvedilol was decreased.  Did see that Eliquis was decreased to 2.5 mg PO BID on 07/26/21.  Pt reports she was not told this and is taking Eliquis 5 mg PO QD.  I advised pt that medication was decreased.   Scott's note:  Creatinine stable. K+ normal.  Creatinine has remained 1.5 or higher therefore her dose of Eliquis needs to be adjusted.  PLAN:  -Decrease Eliquis to 2.5 mg twice daily.  -BMET 3 mos Richardson Dopp, [place]    07/26/2021 8:03 AM    Pt would like Richardson Dopp to tell her which medications need to be split in half.  Advised pt Nicki Reaper is not in the office but I will send message for provider to address.

## 2021-07-31 NOTE — Telephone Encounter (Signed)
Patient is returning call.  °

## 2021-07-31 NOTE — Telephone Encounter (Signed)
Pt c/o medication issue:  1. Name of Medication: carvedilol (COREG) 12.5 MG tablet losartan (COZAAR) 50 MG tablet  2. How are you currently taking this medication (dosage and times per day)? As prescribed   3. Are you having a reaction (difficulty breathing--STAT)?   4. What is your medication issue? Pt's pharmacy wanted her to call and ask if she should be cutting these medications in half herself or should the pharmacy being do this for her. Please advise.

## 2021-07-31 NOTE — Telephone Encounter (Signed)
Called pt advised of [place], PA recommendation: " Imogene Burn, PA-C  You 59 minutes ago (3:25 PM)   I'm covering for [employer]. Yes he said to decrease Eliquis to 2.5 mg bid because of kidney funtion, but Dr. Radford Pax also recommended decreasing coreg 6.25 mg bid for bradycardia. Please make both changes. thanks     Will send in a new script for carvedilol as it was not sent in.  Pt verbalizes understanding.  No further questions or concerns.

## 2021-08-06 ENCOUNTER — Ambulatory Visit (HOSPITAL_COMMUNITY)
Admission: RE | Admit: 2021-08-06 | Discharge: 2021-08-06 | Disposition: A | Discharge status: Home / Self Care | Payer: Medicare Other | Source: Ambulatory Visit | Attending: Family | Admitting: Family

## 2021-08-06 DIAGNOSIS — K469 Unspecified abdominal hernia without obstruction or gangrene: Secondary | ICD-10-CM | POA: Insufficient documentation

## 2021-08-06 MED ORDER — IOHEXOL 300 MG/ML  SOLN
75.0000 mL | Freq: Once | INTRAMUSCULAR | Status: AC | PRN
  Administered 2021-08-06: 75 mL via INTRAVENOUS

## 2021-10-26 ENCOUNTER — Ambulatory Visit: Payer: Medicare Other | Attending: Physician Assistant

## 2021-10-26 DIAGNOSIS — Z79899 Other long term (current) drug therapy: Secondary | ICD-10-CM

## 2021-10-26 DIAGNOSIS — R7989 Other specified abnormal findings of blood chemistry: Secondary | ICD-10-CM

## 2021-10-26 LAB — BASIC METABOLIC PANEL
BUN/Creatinine Ratio: 17 (ref 12–28)
BUN: 28 mg/dL — ABNORMAL HIGH (ref 8–27)
CO2: 21 mmol/L (ref 20–29)
Calcium: 10.2 mg/dL (ref 8.7–10.3)
Chloride: 110 mmol/L — ABNORMAL HIGH (ref 96–106)
Creatinine, Ser: 1.62 mg/dL — ABNORMAL HIGH (ref 0.57–1.00)
Glucose: 88 mg/dL (ref 70–99)
Potassium: 4 mmol/L (ref 3.5–5.2)
Sodium: 144 mmol/L (ref 134–144)
eGFR: 31 mL/min/{1.73_m2} — ABNORMAL LOW (ref >59)

## 2022-01-04 ENCOUNTER — Other Ambulatory Visit: Payer: Self-pay | Admitting: Physician Assistant

## 2022-01-04 NOTE — Telephone Encounter (Signed)
Prescription refill request for Eliquis received. Indication:afib Last office visit:7/23 Scr:1.6 Age: 81 Weight:71.8  kg  Prescription refilled

## 2022-01-11 ENCOUNTER — Other Ambulatory Visit: Payer: Self-pay | Admitting: Family

## 2022-01-11 ENCOUNTER — Encounter: Payer: Self-pay | Admitting: Cardiology

## 2022-01-11 ENCOUNTER — Ambulatory Visit: Payer: Medicare Other | Attending: Cardiology | Admitting: Cardiology

## 2022-01-11 ENCOUNTER — Ambulatory Visit
Admission: RE | Admit: 2022-01-11 | Discharge: 2022-01-11 | Disposition: A | Discharge status: Home / Self Care | Payer: Medicare Other | Source: Ambulatory Visit | Attending: Family | Admitting: Family

## 2022-01-11 VITALS — BP 132/82 | HR 49 | Ht 61.0 in | Wt 159.0 lb

## 2022-01-11 DIAGNOSIS — I5032 Chronic diastolic (congestive) heart failure: Secondary | ICD-10-CM | POA: Diagnosis not present

## 2022-01-11 DIAGNOSIS — I10 Essential (primary) hypertension: Secondary | ICD-10-CM | POA: Diagnosis not present

## 2022-01-11 DIAGNOSIS — I4819 Other persistent atrial fibrillation: Secondary | ICD-10-CM

## 2022-01-11 DIAGNOSIS — N1831 Chronic kidney disease, stage 3a: Secondary | ICD-10-CM | POA: Diagnosis not present

## 2022-01-11 DIAGNOSIS — R0781 Pleurodynia: Secondary | ICD-10-CM

## 2022-01-11 LAB — CBC

## 2022-01-11 NOTE — Progress Notes (Signed)
Cardiology Office Note:    Date:  01/11/2022   ID:  Susan [name], DOB [birth year], MRN [id number]  PCP:  Sonia Side., [place] Providers Cardiologist:  Fransico Him, MD    Referring MD: Sonia Side., FNP   Chief Complaint:  No chief complaint on file.    Patient Profile: (HFpEF) heart failure with preserved ejection fraction  Persistent atrial fibrillation  Right Bundle Branch Block  Non-ischemic cardiomyopathy  EF in 2017 30-35 >> improved to normal Echocardiogram 08/2019: EF 55-60 Hypertension  Hyperlipidemia  Chronic kidney disease  OSA on CPAP Chronic venous insufficiency  Followed by Dr. Scot Dock  GERD Hx of CVA  Depression  Hyperparathyroidism   Prior CV Studies: Echocardiogram 09/03/19 EF 55-60, no RWMA, mild LVH, Gr 1 DD, normal RVSF, normal PASP, mod LAE, trivial MR  Carotid US 01/26/15 RICA 1-39  Myoview 04/13/13 EF 53, no ischemia   History of Present Illness:   Susan [name] is a 82 y.o. female with the above problem list.  She is here today for followup.  She developed a sore throat and cold sx over New Years and was COVID/Flu/RSV negative at her PCP yesterday.      Since she was seen last by Cardiology She denies any chest pain or pressure, SOB, DOE, PND, orthopnea, pedal edema, dizziness, palpitations or syncope. She is compliant with her meds and is tolerating meds with no SE.      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)    Family history of anesthesia complication    "some wake up during OR; some are hard to wake up; some both" (07/29/2012)   GERD (gastroesophageal reflux disease)    Hemiparesis affecting dominant  side as late effect of stroke (HCC)    History of CVA (cerebrovascular accident) 02/01/2015   History of stomach ulcers 1980's   Hyperlipidemia    Hyperparathyroidism, primary ([place]) 04/22/2012   Hypertensive heart disease    Incontinent of urine    wears pads   Major depressive disorder, recurrent episode, 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   azelastine (ASTELIN) 0.1 % nasal spray Place 1 spray into both nostrils 2 (two) times daily as needed for rhinitis. Use in each nostril as directed   busPIRone (BUSPAR) 15 MG tablet Take 15 mg by mouth 2 (two) times daily.   Calcium Carb-Cholecalciferol 600-10 MG-MCG TABS Take 1 tablet by mouth 2 (two) times daily.   carvedilol (COREG) 6.25 MG tablet Take 1 tablet (6.25 mg total) by mouth 2 (two) times daily.  cetirizine (ZYRTEC) 10 MG tablet Take 10 mg by mouth daily.   diclofenac sodium (VOLTAREN) 1 % GEL Apply 2 g topically 4 (four) times daily.   docusate sodium (COLACE) 100 MG capsule Take 1 capsule (100 mg total) by mouth daily as needed.   donepezil (ARICEPT) 10 MG tablet Take 1 tablet (10 mg total) by mouth at bedtime. For memory loss   ELIQUIS 2.5 MG TABS tablet TAKE 1 TABLET BY MOUTH 2 TIMES DAILY   FEROSUL 325 (65 Fe) MG tablet Take 325 mg by mouth 2 (two) times daily.   furosemide (LASIX) 40 MG tablet Take 40 mg by mouth daily.   hydrALAZINE (APRESOLINE) 25 MG tablet TAKE 1 TABLET BY MOUTH 3 TIMES DAILY (Patient taking differently: Take 25 mg by mouth 3  (three) times daily.)   losartan (COZAAR) 50 MG tablet TAKE 1 TABLET BY MOUTH EVERY DAY (Patient taking differently: Take 50 mg by mouth daily.)   Menthol, Topical Analgesic, (BIOFREEZE) 4 % GEL Apply 3 oz topically 3 (three) times daily. For knee pain (Patient taking differently: Apply 1 application  topically 3 (three) times daily as needed (knee pain).)   methocarbamol (ROBAXIN) 500 MG tablet Take 1 tablet (500 mg total) by mouth 2 (two) times daily as needed.   Multiple Vitamin (GNP ESSENTIAL ONE DAILY) TABS TAKE 1 TABLET BY MOUTH EVERY DAY (Patient taking differently: Take 1 tablet by mouth daily.)   pravastatin (PRAVACHOL) 40 MG tablet TAKE 1 TABLET BY MOUTH EVERY DAY (Patient taking differently: Take 40 mg by mouth daily.)   sertraline (ZOLOFT) 100 MG tablet Take 100 mg by mouth daily.   SYMBICORT 80-4.5 MCG/ACT inhaler Inhale 2 puffs into the lungs.    Allergies:   Ace inhibitors, Lipitor [atorvastatin], Simvastatin, and Latex   Social History   Tobacco Use   Smoking status: Former    Packs/day: 1.00    Years: 30.00    Total pack years: 30.00    Types: Cigarettes    Quit date: 01/07/1978    Years since quitting: 44.0   Smokeless tobacco: Never  Vaping Use   Vaping Use: Never used  Substance Use Topics   Alcohol use: No   Drug use: No    Family Hx: The patient's family history includes Breast cancer in her mother; Diabetes in her daughter; Hypertension in her daughter and mother; Lung cancer in her father; Stroke in her mother.  Review of Systems  Gastrointestinal:  Negative for hematochezia and melena.  Genitourinary:  Negative for hematuria.     EKGs/Labs/Other Test Reviewed:    EKG:  EKG is  ordered today.  The ekg ordered today demonstrates sinus bradycardia, HR 57, left anterior fascicular block, right bundle branch block, QTc 459, no change from prior tracing  Recent Labs: 07/04/2021: ALT 18; Hemoglobin 10.1; Platelets 236 10/26/2021: BUN 28; Creatinine, Ser 1.62;  Potassium 4.0; Sodium 144   Recent Lipid Panel No results for input(s): "CHOL", "TRIG", "HDL", "VLDL", "LDLCALC", "LDLDIRECT" in the last 8760 hours.   Risk Assessment/Calculations/Metrics:    CHA2DS2-VASc Score = 7   This indicates a 11.2% annual risk of stroke. The patient's score is based upon: CHF History: 1 HTN History: 1 Diabetes History: 0 Stroke History: 2 Vascular Disease History: 0 Age Score: 2 Gender Score: 1             Physical Exam:    VS:  BP 132/82   Pulse (!) 49   Ht '5\' 1"'$  (1.549 m)  Wt 159 lb (72.1 kg)   SpO2 98%   BMI 30.04 kg/m     Wt Readings from Last 3 Encounters:  01/11/22 159 lb (72.1 kg)  07/09/21 158 lb 6.4 oz (71.8 kg)  09/28/20 162 lb (73.5 kg)   GEN: Well nourished, well developed in no acute distress HEENT: Normal NECK: No JVD; No carotid bruits LYMPHATICS: No lymphadenopathy CARDIAC:RRR, no murmurs, rubs, gallops RESPIRATORY:  Clear to auscultation without rales, wheezing or rhonchi  ABDOMEN: Soft, non-tender, non-distended MUSCULOSKELETAL:  No edema; No deformity  SKIN: Warm and dry NEUROLOGIC:  Alert and oriented x 3 PSYCHIATRIC:  Normal affect       ASSESSMENT & PLAN:    Persistent atrial fibrillation -she remains in NSR and denies any palpitations -denies any bleeding problems on DOAC -continue prescription drug management with  Carvedilol 6.'25mg'$  BID and Eliquis 2.'5mg'$  BID (dosed for age>80 and SCr.1.5) with PRN refills  (HFpEF) heart failure with preserved ejection fraction ([place]) -she appears euvolemic on exam today -normal EF 55-60% on echo 2021 -continue prescription drug management with lasix '40mg'$  daily with PRN refills -check BMET   Essential hypertension -BP controlled on exam today -continue prescription drug management with Carvedilol 6.'25mg'$  BID, Hydralazine '25mg'$  TID, Losartan '50mg'$  daily with PRN refills  CKD (chronic kidney disease) stage 3, GFR 30-59 ml/min ([place]) -followed by PCP    Dispo:  Followup 1  year  Medication Adjustments/Labs and Tests Ordered: Current medicines are reviewed at length with the patient today.  Concerns regarding medicines are outlined above.  Tests Ordered: No orders of the defined types were placed in this encounter.  Medication Changes: No orders of the defined types were placed in this encounter.  Signed, Fransico Him, MD  01/11/2022 10:28 AM    [facility] [place], [place], [place]  [address] Phone: [phone]; Fax: [phone]

## 2022-01-11 NOTE — Patient Instructions (Addendum)
Medication Instructions:  Your physician recommends that you continue on your current medications as directed. Please refer to the Current Medication list given to you today.  *If you need a refill on your cardiac medications before your next appointment, please call your pharmacy*  Lab Work: TODAY: BMET, CBC If you have labs (blood work) drawn today and your tests are completely normal, you will receive your results only by: [place] (if you have MyChart) OR A paper copy in the mail If you have any lab test that is abnormal or we need to change your treatment, we will call you to review the results.  Follow-Up: At [facility], you and your health needs are our priority.  As part of our continuing mission to provide you with exceptional heart care, we have created designated Provider Care Teams.  These Care Teams include your primary Cardiologist (physician) and Advanced Practice Providers (APPs -  Physician Assistants and Nurse Practitioners) who all work together to provide you with the care you need, when you need it.  Your next appointment:   1 year(s)  The format for your next appointment:   In Person  Provider:   Fransico Him, MD      Important Information About Sugar

## 2022-01-11 NOTE — Addendum Note (Signed)
Addended by: Bernestine Amass on: 01/11/2022 10:33 AM   Modules accepted: Orders

## 2022-01-12 LAB — BASIC METABOLIC PANEL
BUN/Creatinine Ratio: 18 (ref 12–28)
BUN: 26 mg/dL (ref 8–27)
CO2: 20 mmol/L (ref 20–29)
Calcium: 10.4 mg/dL — ABNORMAL HIGH (ref 8.7–10.3)
Chloride: 109 mmol/L — ABNORMAL HIGH (ref 96–106)
Creatinine, Ser: 1.46 mg/dL — ABNORMAL HIGH (ref 0.57–1.00)
Glucose: 88 mg/dL (ref 70–99)
Potassium: 4 mmol/L (ref 3.5–5.2)
Sodium: 143 mmol/L (ref 134–144)
eGFR: 35 mL/min/{1.73_m2} — ABNORMAL LOW (ref >59)

## 2022-01-12 LAB — CBC
Hematocrit: 35.4 % (ref 34.0–46.6)
Hemoglobin: 11.4 g/dL (ref 11.1–15.9)
MCH: 30.9 pg (ref 26.6–33.0)
MCHC: 32.2 g/dL (ref 31.5–35.7)
MCV: 96 fL (ref 79–97)
Platelets: 256 10*3/uL (ref 150–450)
RBC: 3.69 x10E6/uL — ABNORMAL LOW (ref 3.77–5.28)
RDW: 12.8 % (ref 11.7–15.4)
WBC: 3.1 10*3/uL — ABNORMAL LOW (ref 3.4–10.8)

## 2022-03-14 ENCOUNTER — Ambulatory Visit
Admission: RE | Admit: 2022-03-14 | Discharge: 2022-03-14 | Disposition: A | Discharge status: Home / Self Care | Payer: Medicare Other | Source: Ambulatory Visit | Attending: Registered Nurse | Admitting: Registered Nurse

## 2022-03-14 ENCOUNTER — Encounter: Payer: Self-pay | Admitting: Registered Nurse

## 2022-03-14 ENCOUNTER — Other Ambulatory Visit: Payer: Self-pay | Admitting: Registered Nurse

## 2022-03-14 DIAGNOSIS — M79672 Pain in left foot: Secondary | ICD-10-CM

## 2022-03-14 DIAGNOSIS — M25572 Pain in left ankle and joints of left foot: Secondary | ICD-10-CM

## 2022-03-14 DIAGNOSIS — R600 Localized edema: Secondary | ICD-10-CM

## 2022-04-11 ENCOUNTER — Ambulatory Visit
Admission: RE | Admit: 2022-04-11 | Discharge: 2022-04-11 | Disposition: A | Discharge status: Home / Self Care | Payer: Medicare Other | Source: Ambulatory Visit | Attending: Registered Nurse | Admitting: Registered Nurse

## 2022-04-11 ENCOUNTER — Other Ambulatory Visit: Payer: Self-pay | Admitting: Registered Nurse

## 2022-04-11 DIAGNOSIS — M79672 Pain in left foot: Secondary | ICD-10-CM

## 2022-04-11 DIAGNOSIS — R600 Localized edema: Secondary | ICD-10-CM

## 2022-04-11 DIAGNOSIS — M25572 Pain in left ankle and joints of left foot: Secondary | ICD-10-CM

## 2022-05-15 ENCOUNTER — Other Ambulatory Visit: Payer: Self-pay | Admitting: Family

## 2022-05-15 ENCOUNTER — Ambulatory Visit
Admission: RE | Admit: 2022-05-15 | Discharge: 2022-05-15 | Disposition: A | Discharge status: Home / Self Care | Payer: Medicare Other | Source: Ambulatory Visit | Attending: Family | Admitting: Family

## 2022-05-15 DIAGNOSIS — M25572 Pain in left ankle and joints of left foot: Secondary | ICD-10-CM

## 2022-05-15 DIAGNOSIS — M79605 Pain in left leg: Secondary | ICD-10-CM

## 2022-05-15 DIAGNOSIS — M545 Low back pain, unspecified: Secondary | ICD-10-CM

## 2022-05-15 DIAGNOSIS — M549 Dorsalgia, unspecified: Secondary | ICD-10-CM

## 2022-08-06 ENCOUNTER — Other Ambulatory Visit: Payer: Self-pay | Admitting: Cardiology

## 2022-08-16 ENCOUNTER — Other Ambulatory Visit: Payer: Self-pay | Admitting: Family

## 2022-08-16 ENCOUNTER — Ambulatory Visit
Admission: RE | Admit: 2022-08-16 | Discharge: 2022-08-16 | Disposition: A | Discharge status: Home / Self Care | Payer: Medicare Other | Source: Ambulatory Visit | Attending: Family | Admitting: Family

## 2022-08-16 DIAGNOSIS — M25552 Pain in left hip: Secondary | ICD-10-CM

## 2022-10-23 ENCOUNTER — Inpatient Hospital Stay (HOSPITAL_BASED_OUTPATIENT_CLINIC_OR_DEPARTMENT_OTHER)
Admission: EM | Admit: 2022-10-23 | Discharge: 2022-10-30 | DRG: 683 | Disposition: A | Discharge status: Skilled Nursing Facility | Payer: Medicare Other | Attending: Internal Medicine | Admitting: Internal Medicine

## 2022-10-23 ENCOUNTER — Other Ambulatory Visit: Payer: Self-pay

## 2022-10-23 ENCOUNTER — Emergency Department (HOSPITAL_BASED_OUTPATIENT_CLINIC_OR_DEPARTMENT_OTHER): Payer: Medicare Other

## 2022-10-23 DIAGNOSIS — G8929 Other chronic pain: Secondary | ICD-10-CM | POA: Diagnosis not present

## 2022-10-23 DIAGNOSIS — I69351 Hemiplegia and hemiparesis following cerebral infarction affecting right dominant side: Secondary | ICD-10-CM

## 2022-10-23 DIAGNOSIS — R8281 Pyuria: Secondary | ICD-10-CM

## 2022-10-23 DIAGNOSIS — Z7951 Long term (current) use of inhaled steroids: Secondary | ICD-10-CM

## 2022-10-23 DIAGNOSIS — N179 Acute kidney failure, unspecified: Secondary | ICD-10-CM | POA: Diagnosis not present

## 2022-10-23 DIAGNOSIS — Y92013 Bedroom of single-family (private) house as the place of occurrence of the external cause: Secondary | ICD-10-CM | POA: Diagnosis not present

## 2022-10-23 DIAGNOSIS — Z87891 Personal history of nicotine dependence: Secondary | ICD-10-CM | POA: Diagnosis not present

## 2022-10-23 DIAGNOSIS — Z8249 Family history of ischemic heart disease and other diseases of the circulatory system: Secondary | ICD-10-CM

## 2022-10-23 DIAGNOSIS — I130 Hypertensive heart and chronic kidney disease with heart failure and stage 1 through stage 4 chronic kidney disease, or unspecified chronic kidney disease: Secondary | ICD-10-CM | POA: Diagnosis not present

## 2022-10-23 DIAGNOSIS — E876 Hypokalemia: Secondary | ICD-10-CM | POA: Diagnosis not present

## 2022-10-23 DIAGNOSIS — D631 Anemia in chronic kidney disease: Secondary | ICD-10-CM | POA: Diagnosis not present

## 2022-10-23 DIAGNOSIS — R471 Dysarthria and anarthria: Secondary | ICD-10-CM | POA: Diagnosis present

## 2022-10-23 DIAGNOSIS — E785 Hyperlipidemia, unspecified: Secondary | ICD-10-CM | POA: Diagnosis present

## 2022-10-23 DIAGNOSIS — Z823 Family history of stroke: Secondary | ICD-10-CM

## 2022-10-23 DIAGNOSIS — W06XXXA Fall from bed, initial encounter: Secondary | ICD-10-CM | POA: Diagnosis present

## 2022-10-23 DIAGNOSIS — S93402A Sprain of unspecified ligament of left ankle, initial encounter: Secondary | ICD-10-CM | POA: Diagnosis present

## 2022-10-23 DIAGNOSIS — N1832 Chronic kidney disease, stage 3b: Secondary | ICD-10-CM | POA: Diagnosis not present

## 2022-10-23 DIAGNOSIS — I872 Venous insufficiency (chronic) (peripheral): Secondary | ICD-10-CM | POA: Diagnosis present

## 2022-10-23 DIAGNOSIS — R079 Chest pain, unspecified: Secondary | ICD-10-CM | POA: Diagnosis present

## 2022-10-23 DIAGNOSIS — Z833 Family history of diabetes mellitus: Secondary | ICD-10-CM

## 2022-10-23 DIAGNOSIS — Z96653 Presence of artificial knee joint, bilateral: Secondary | ICD-10-CM | POA: Diagnosis not present

## 2022-10-23 DIAGNOSIS — G629 Polyneuropathy, unspecified: Secondary | ICD-10-CM | POA: Diagnosis present

## 2022-10-23 DIAGNOSIS — Y92009 Unspecified place in unspecified non-institutional (private) residence as the place of occurrence of the external cause: Secondary | ICD-10-CM

## 2022-10-23 DIAGNOSIS — M549 Dorsalgia, unspecified: Secondary | ICD-10-CM | POA: Diagnosis present

## 2022-10-23 DIAGNOSIS — Z7901 Long term (current) use of anticoagulants: Secondary | ICD-10-CM | POA: Diagnosis not present

## 2022-10-23 DIAGNOSIS — R001 Bradycardia, unspecified: Secondary | ICD-10-CM | POA: Diagnosis present

## 2022-10-23 DIAGNOSIS — Z803 Family history of malignant neoplasm of breast: Secondary | ICD-10-CM

## 2022-10-23 DIAGNOSIS — Z888 Allergy status to other drugs, medicaments and biological substances status: Secondary | ICD-10-CM

## 2022-10-23 DIAGNOSIS — E860 Dehydration: Secondary | ICD-10-CM | POA: Diagnosis present

## 2022-10-23 DIAGNOSIS — M48061 Spinal stenosis, lumbar region without neurogenic claudication: Secondary | ICD-10-CM | POA: Diagnosis present

## 2022-10-23 DIAGNOSIS — Z79899 Other long term (current) drug therapy: Secondary | ICD-10-CM | POA: Diagnosis not present

## 2022-10-23 DIAGNOSIS — W19XXXA Unspecified fall, initial encounter: Secondary | ICD-10-CM

## 2022-10-23 DIAGNOSIS — M47819 Spondylosis without myelopathy or radiculopathy, site unspecified: Secondary | ICD-10-CM | POA: Diagnosis not present

## 2022-10-23 DIAGNOSIS — I480 Paroxysmal atrial fibrillation: Secondary | ICD-10-CM

## 2022-10-23 DIAGNOSIS — Z91040 Latex allergy status: Secondary | ICD-10-CM

## 2022-10-23 DIAGNOSIS — M79605 Pain in left leg: Secondary | ICD-10-CM | POA: Diagnosis present

## 2022-10-23 DIAGNOSIS — R531 Weakness: Secondary | ICD-10-CM | POA: Diagnosis present

## 2022-10-23 DIAGNOSIS — E210 Primary hyperparathyroidism: Secondary | ICD-10-CM | POA: Diagnosis present

## 2022-10-23 DIAGNOSIS — E782 Mixed hyperlipidemia: Secondary | ICD-10-CM

## 2022-10-23 DIAGNOSIS — K219 Gastro-esophageal reflux disease without esophagitis: Secondary | ICD-10-CM | POA: Diagnosis present

## 2022-10-23 DIAGNOSIS — R7989 Other specified abnormal findings of blood chemistry: Secondary | ICD-10-CM

## 2022-10-23 DIAGNOSIS — I5032 Chronic diastolic (congestive) heart failure: Secondary | ICD-10-CM

## 2022-10-23 DIAGNOSIS — Z801 Family history of malignant neoplasm of trachea, bronchus and lung: Secondary | ICD-10-CM

## 2022-10-23 DIAGNOSIS — D638 Anemia in other chronic diseases classified elsewhere: Secondary | ICD-10-CM | POA: Diagnosis not present

## 2022-10-23 DIAGNOSIS — I452 Bifascicular block: Secondary | ICD-10-CM | POA: Diagnosis present

## 2022-10-23 DIAGNOSIS — I4819 Other persistent atrial fibrillation: Secondary | ICD-10-CM | POA: Diagnosis not present

## 2022-10-23 DIAGNOSIS — E861 Hypovolemia: Secondary | ICD-10-CM | POA: Diagnosis present

## 2022-10-23 DIAGNOSIS — Z8711 Personal history of peptic ulcer disease: Secondary | ICD-10-CM

## 2022-10-23 DIAGNOSIS — G4733 Obstructive sleep apnea (adult) (pediatric): Secondary | ICD-10-CM | POA: Diagnosis present

## 2022-10-23 DIAGNOSIS — F0150 Vascular dementia without behavioral disturbance: Secondary | ICD-10-CM | POA: Diagnosis present

## 2022-10-23 DIAGNOSIS — Y92003 Bedroom of unspecified non-institutional (private) residence as the place of occurrence of the external cause: Secondary | ICD-10-CM

## 2022-10-23 DIAGNOSIS — M4316 Spondylolisthesis, lumbar region: Secondary | ICD-10-CM | POA: Diagnosis present

## 2022-10-23 LAB — URINALYSIS, ROUTINE W REFLEX MICROSCOPIC
Bacteria, UA: NONE SEEN
Bilirubin Urine: NEGATIVE
Glucose, UA: NEGATIVE mg/dL
Hgb urine dipstick: NEGATIVE
Nitrite: NEGATIVE
Specific Gravity, Urine: 1.019 (ref 1.005–1.030)
pH: 5 (ref 5.0–8.0)

## 2022-10-23 LAB — BASIC METABOLIC PANEL
Anion gap: 8 (ref 5–15)
BUN: 40 mg/dL — ABNORMAL HIGH (ref 8–23)
CO2: 24 mmol/L (ref 22–32)
Calcium: 10.7 mg/dL — ABNORMAL HIGH (ref 8.9–10.3)
Chloride: 107 mmol/L (ref 98–111)
Creatinine, Ser: 2.27 mg/dL — ABNORMAL HIGH (ref 0.44–1.00)
GFR, Estimated: 21 mL/min — ABNORMAL LOW (ref >60)
Glucose, Bld: 124 mg/dL — ABNORMAL HIGH (ref 70–99)
Potassium: 4.3 mmol/L (ref 3.5–5.1)
Sodium: 139 mmol/L (ref 135–145)

## 2022-10-23 LAB — CBC
HCT: 34.6 % — ABNORMAL LOW (ref 36.0–46.0)
Hemoglobin: 11.5 g/dL — ABNORMAL LOW (ref 12.0–15.0)
MCH: 31.2 pg (ref 26.0–34.0)
MCHC: 33.2 g/dL (ref 30.0–36.0)
MCV: 93.8 fL (ref 80.0–100.0)
Platelets: 201 10*3/uL (ref 150–400)
RBC: 3.69 MIL/uL — ABNORMAL LOW (ref 3.87–5.11)
RDW: 13.1 % (ref 11.5–15.5)
WBC: 3.8 10*3/uL — ABNORMAL LOW (ref 4.0–10.5)
nRBC: 0 % (ref 0.0–0.2)

## 2022-10-23 LAB — CBG MONITORING, ED: Glucose-Capillary: 108 mg/dL — ABNORMAL HIGH (ref 70–99)

## 2022-10-23 LAB — TROPONIN I (HIGH SENSITIVITY)
Troponin I (High Sensitivity): 30 ng/L — ABNORMAL HIGH (ref <18)
Troponin I (High Sensitivity): 33 ng/L — ABNORMAL HIGH (ref <18)

## 2022-10-23 MED ORDER — ACETAMINOPHEN 650 MG RE SUPP: 650.0000 mg | Freq: Four times a day (QID) | RECTAL | Status: DC | PRN

## 2022-10-23 MED ORDER — ONDANSETRON HCL 4 MG/2ML IJ SOLN: 4.0000 mg | Freq: Four times a day (QID) | INTRAMUSCULAR | Status: DC | PRN

## 2022-10-23 MED ORDER — ACETAMINOPHEN 325 MG PO TABS: 650.0000 mg | ORAL_TABLET | Freq: Four times a day (QID) | ORAL | Status: DC | PRN

## 2022-10-23 MED ORDER — MELATONIN 3 MG PO TABS
3.0000 mg | ORAL_TABLET | Freq: Every evening | ORAL | Status: DC | PRN
  Administered 2022-10-28 – 2022-10-29 (×2): 3 mg via ORAL
  Filled 2022-10-23 (×2): qty 1

## 2022-10-23 MED ORDER — LACTATED RINGERS IV SOLN: INTRAVENOUS | Status: AC

## 2022-10-23 MED ORDER — SODIUM CHLORIDE 0.9 % IV BOLUS
1000.0000 mL | Freq: Once | INTRAVENOUS | Status: AC
  Administered 2022-10-23: 1000 mL via INTRAVENOUS

## 2022-10-23 NOTE — ED Notes (Signed)
Pt is having generalized weakness and has lower back and leg pain. Speech appears clear to me but daughter states that pt speech is slower.

## 2022-10-23 NOTE — ED Provider Notes (Signed)
Care assumed from [facility], PA-C at shift change.  Please see their note for further information.  Briefly: Patient presents with generalized weakness.  Had a fall earlier this morning and had to call EMS to help her get up, refused transport at that time.  Having persistently worsening weakness in her bilateral legs and decreased appetite.  Additionally had some slurred speech on Sunday.  Unable to walk today.  Plan: Patient with AKI on labs, will need admission. Also concerns for CVA given weakness and speech changes. Head CT normal here, will likely need further work-up for this while admitted.   Discussed patient with hospitalist Dr. Carren Rang who accepts patient for admission.   Silva Bandy, PA-C 10/23/22 [id number]    Royanne Foots, DO 10/28/22 [phone]

## 2022-10-23 NOTE — ED Notes (Signed)
Pt is still in radiology, will draw troponin once she has returned

## 2022-10-23 NOTE — Progress Notes (Signed)
82 year old patient presents with 1 week of progressive weakness, and reported slurred speech.  EDP reports patient has not slurred at this time but her speech is slow.  She did have 2 falls today, EMS had to get her up both times.  She denied hitting her head.  She is on Eliquis.  Her CT head showed no acute intracranial abnormality.  Her CT C-spine showed no acute fracture.  Her CT lumbar spine showed no acute osseous abnormality but she did have some spinal stenosis at L4/5 and L3/4.  Workup also reveals an AKI with a creatinine baseline of 1.46 and today 2.27.  Her troponins are elevated but flat at 30, 33.  Her chest x-ray shows cardiomegaly and pulmonary vascular congestion.  AKI seems to be related to hypovolemia.  She got 1 L bolus in the ED.  Asked him to continue either encouraging p.o. fluids or continue with IV fluids.  There are some consideration for an MRI as well.  Her vitals are stable and she is appropriate for the telemetry floor.  [facility] will assume care when patient arrives to [facility].

## 2022-10-23 NOTE — ED Notes (Signed)
Pt given female urinal.  Pt voided another 300cc.  Pt then felt she needs to have a BM, placed on bedpan.  Daughter remains at the bedside

## 2022-10-23 NOTE — ED Notes (Signed)
Pt lives independently and has home health aide once a week and gets meals on wheels and her daughter checks in on her.  Pt has been eating less and has had a decline, she is slower and has had low back pain since the weekend, pt fell out of bed last pm, no LOC, increased back and leg pain since, increased difficulty ambulatory.

## 2022-10-23 NOTE — ED Notes (Signed)
Pt taken back off bedpan, was not able to use it.  Repositioned pt in bed.  Pt passed swallow screen, gave her water, juice and tv dinner and granola bar.  Family is at the bedside.  Pt took hydralazine (home med).

## 2022-10-23 NOTE — ED Notes (Signed)
Called for transport spoke with Ruby @ 09:05pm

## 2022-10-23 NOTE — H&P (Signed)
History and Physical      PAULINA ANNEN ONG:[id number] DOB: 05/15/1940 DOA: 10/23/2022; DOS: 10/23/2022  PCP: Raymon Mutton., FNP *** Patient coming from: home ***  I have personally briefly reviewed patient's old medical records in [facility]  Chief Complaint: ***  HPI: ABIRA KOKAL is a 82 y.o. female with medical history significant for *** who is admitted to [facility] on 10/23/2022 with *** after presenting from home*** to [facility] ED complaining of ***.    ***       ***   ED Course:  Vital signs in the ED were notable for the following: ***  Labs were notable for the following: ***  Per my interpretation, EKG in ED demonstrated the following:  ***  Imaging in the ED, per corresponding formal radiology read, was notable for the following:  ***  While in the ED, the following were administered: ***  Subsequently, the patient was admitted  ***  ***red    Review of Systems: As per HPI otherwise 10 point review of systems negative.   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)    Family history of anesthesia complication    "some wake up during OR; some are hard to wake up; some both" (07/29/2012)   GERD (gastroesophageal reflux disease)    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 (HCC)    History of CVA (cerebrovascular accident) 02/01/2015   History of stomach ulcers 1980's   Hyperlipidemia    Hyperparathyroidism, primary (HCC) 04/22/2012   Hypertensive heart disease    Incontinent of urine    wears pads   Major depressive disorder, recurrent episode, 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   OSA on CPAP    Osteoarthritis of right  knee    Pedal edema    Persistent atrial fibrillation (HCC)    CHADS2VASC score is 7 - on chronic anticoagulation with Apixaban   Spinal stenosis    Umbilical hernia    "unrepaired" (07/29/2012)   Varicose veins    "BLE" (07/29/2012)   Vascular dementia (HCC)    Venous stasis dermatitis of left lower extremity 04/29/2018    Past Surgical History:  Procedure Laterality Date   CATARACT EXTRACTION     COLONOSCOPY     ENDOVENOUS ABLATION SAPHENOUS VEIN W/ LASER Left 06/24/2019   endovenous laser ablation left greater saphenous vein by Cari Caraway MD    IUD REMOVAL  1980's   PARATHYROIDECTOMY N/A 06/16/2012   Procedure: NECK EXPLORATION AND LEFT SUPERIOR PARATHYROIDECTOMY;  Surgeon: [name], MD;  Location: [facility] ORS;  Service: General;  Laterality: N/A;   TOTAL KNEE ARTHROPLASTY Right 07/29/2012   TOTAL KNEE ARTHROPLASTY Right 07/29/2012   Procedure: TOTAL KNEE ARTHROPLASTY;  Surgeon: Loreta Ave, MD;  Location: [facility] OR;  Service: Orthopedics;  Laterality: Right;   TOTAL KNEE ARTHROPLASTY Left 12/13/2019   Procedure: LEFT TOTAL KNEE ARTHROPLASTY;  Surgeon: Tarry Kos, MD;  Location: [facility] OR;  Service: Orthopedics;  Laterality: Left;    Social History:  reports that she quit smoking about 44 years ago. Her smoking use included cigarettes. She started smoking about  74 years ago. She has a 30 pack-year smoking history. She has never used smokeless tobacco. She reports that she does not drink alcohol and does not use drugs.   Allergies  Allergen Reactions   Ace Inhibitors Cough   Lipitor [Atorvastatin] Swelling   Simvastatin Other (See Comments)    Myalgias      Latex Itching    Family History  Problem Relation Age of Onset   Hypertension Mother        deceased   Stroke Mother    Breast cancer Mother    Lung cancer Father        deceased   Diabetes Daughter    Hypertension Daughter     Family history reviewed and not pertinent ***   Prior to Admission medications    Medication Sig Start Date End Date Taking? Authorizing Provider  albuterol (VENTOLIN HFA) 108 (90 Base) MCG/ACT inhaler Inhale into the lungs. 02/08/21   [provider]  azelastine (ASTELIN) 0.1 % nasal spray Place 1 spray into both nostrils 2 (two) times daily as needed for rhinitis. Use in each nostril as directed    [provider]  busPIRone (BUSPAR) 15 MG tablet Take 15 mg by mouth 2 (two) times daily.    [provider]  Calcium Carb-Cholecalciferol 600-10 MG-MCG TABS Take 1 tablet by mouth 2 (two) times daily. 06/20/21   [provider]  carvedilol (COREG) 6.25 MG tablet TAKE 1 TABLET BY MOUTH 2 TIMES DAILY 08/06/22   Quintella Reichert, MD  cetirizine (ZYRTEC) 10 MG tablet Take 10 mg by mouth daily. 06/20/21   [provider]  diclofenac sodium (VOLTAREN) 1 % GEL Apply 2 g topically 4 (four) times daily. 05/25/18   Sharon Seller, NP  donepezil (ARICEPT) 10 MG tablet Take 1 tablet (10 mg total) by mouth at bedtime. For memory loss 05/25/18   Sharon Seller, NP  ELIQUIS 2.5 MG TABS tablet TAKE 1 TABLET BY MOUTH 2 TIMES DAILY 01/04/22   Tereso Newcomer T, PA-C  FEROSUL 325 (65 Fe) MG tablet Take 325 mg by mouth 2 (two) times daily. 06/21/21   [provider]  furosemide (LASIX) 40 MG tablet Take 40 mg by mouth daily.    [provider]  hydrALAZINE (APRESOLINE) 25 MG tablet TAKE 1 TABLET BY MOUTH 3 TIMES DAILY Patient taking differently: Take 25 mg by mouth 3 (three) times daily. 06/17/18   Sharon Seller, NP  losartan (COZAAR) 50 MG tablet TAKE 1 TABLET BY MOUTH EVERY DAY Patient taking differently: Take 50 mg by mouth daily. 08/07/18   Sharon Seller, NP  Menthol, Topical Analgesic, (BIOFREEZE) 4 % GEL Apply 3 oz topically 3 (three) times daily. For knee pain Patient taking differently: Apply 1 application  topically 3 (three) times daily as needed (knee pain). 09/25/17   Ngetich, Dinah C, NP  methocarbamol (ROBAXIN) 500 MG  tablet Take 1 tablet (500 mg total) by mouth 2 (two) times daily as needed. 12/14/19   Cristie Hem, PA-C  Multiple Vitamin (GNP ESSENTIAL ONE DAILY) TABS TAKE 1 TABLET BY MOUTH EVERY DAY Patient taking differently: Take 1 tablet by mouth daily. 06/17/18   Sharon Seller, NP  pravastatin (PRAVACHOL) 40 MG tablet TAKE 1 TABLET BY MOUTH EVERY DAY Patient taking differently: Take 40 mg by mouth daily. 06/17/18   Sharon Seller, NP  sertraline (ZOLOFT) 100 MG tablet Take 100 mg by mouth daily.    [provider]  SYMBICORT 80-4.5 MCG/ACT inhaler Inhale 2 puffs into the lungs. 02/08/21   [provider]     Objective    Physical Exam: Vitals:   10/23/22 1800 10/23/22 2027 10/23/22 2100 10/23/22 2238  BP: (!) 174/78 (!) 120/90 (!) 152/80 (!) 159/86  Pulse: (!) 47 64 (!) 50 (!) 57  Resp: 18 16    Temp:  97.7 F (36.5 C)  98.5 F (36.9 C)  TempSrc:  Temporal  Oral  SpO2: 99% 95% 97% 100%  Weight:      Height:        General: appears to be stated age; alert, oriented Skin: warm, dry, no rash Head:  AT/[place] Mouth:  Oral mucosa membranes appear moist, normal dentition Neck: supple; trachea midline Heart:  RRR; did not appreciate any M/R/G Lungs: CTAB, did not appreciate any wheezes, rales, or rhonchi Abdomen: + BS; soft, ND, NT Vascular: 2+ pedal pulses b/l; 2+ radial pulses b/l Extremities: no peripheral edema, no muscle wasting Neuro: strength and sensation intact in upper and lower extremities b/l ***   *** Neuro: 5/5 strength of the proximal and distal flexors and extensors of the upper and lower extremities bilaterally; sensation intact in upper and lower extremities b/l; cranial nerves II through XII grossly intact; no pronator drift; no evidence suggestive of slurred speech, dysarthria, or facial droop; Normal muscle tone. No tremors.  *** Neuro: In the setting of the patient's current mental status and associated inability to follow instructions,  unable to perform full neurologic exam at this time.  As such, assessment of strength, sensation, and cranial nerves is limited at this time. Patient noted to spontaneously move all 4 extremities. No tremors.  ***    Labs on Admission: I have personally reviewed following labs and imaging studies  CBC: Recent Labs  Lab 10/23/22 1348  WBC 3.8*  HGB 11.5*  HCT 34.6*  MCV 93.8  PLT 201   Basic Metabolic Panel: Recent Labs  Lab 10/23/22 1348  NA 139  K 4.3  CL 107  CO2 24  GLUCOSE 124*  BUN 40*  CREATININE 2.27*  CALCIUM 10.7*   GFR: Estimated Creatinine Clearance: 16.3 mL/min (A) (by C-G formula based on SCr of 2.27 mg/dL (H)). Liver Function Tests: No results for input(s): "AST", "ALT", "ALKPHOS", "BILITOT", "PROT", "ALBUMIN" in the last 168 hours. No results for input(s): "LIPASE", "AMYLASE" in the last 168 hours. No results for input(s): "AMMONIA" in the last 168 hours. Coagulation Profile: No results for input(s): "INR", "PROTIME" in the last 168 hours. Cardiac Enzymes: No results for input(s): "CKTOTAL", "CKMB", "CKMBINDEX", "TROPONINI" in the last 168 hours. BNP (last 3 results) No results for input(s): "PROBNP" in the last 8760 hours. HbA1C: No results for input(s): "HGBA1C" in the last 72 hours. CBG: Recent Labs  Lab 10/23/22 1334  GLUCAP 108*   Lipid Profile: No results for input(s): "CHOL", "HDL", "LDLCALC", "TRIG", "CHOLHDL", "LDLDIRECT" in the last 72 hours. Thyroid Function Tests: No results for input(s): "TSH", "T4TOTAL", "FREET4", "T3FREE", "THYROIDAB" in the last 72 hours. Anemia Panel: No results for input(s): "VITAMINB12", "FOLATE", "FERRITIN", "TIBC", "IRON", "RETICCTPCT" in the last 72 hours. Urine analysis:    Component Value Date/Time   COLORURINE YELLOW 10/23/2022 1348   APPEARANCEUR CLEAR 10/23/2022 1348   LABSPEC 1.019 10/23/2022 1348   PHURINE 5.0 10/23/2022 1348   GLUCOSEU NEGATIVE 10/23/2022 1348   HGBUR NEGATIVE 10/23/2022 1348    BILIRUBINUR NEGATIVE 10/23/2022 1348   KETONESUR TRACE (A) 10/23/2022 1348   PROTEINUR TRACE (  A) 10/23/2022 1348   UROBILINOGEN 0.2 03/05/2013 1259   NITRITE NEGATIVE 10/23/2022 1348   LEUKOCYTESUR MODERATE (A) 10/23/2022 1348    Radiological Exams on Admission: DG Chest Portable 1 View  Result Date: 10/23/2022 CLINICAL DATA:  Weakness EXAM: PORTABLE CHEST 1 VIEW COMPARISON:  01/11/2022 FINDINGS: Stable cardiomegaly. Aortic atherosclerotic calcification. Pulmonary vascular congestion. No focal consolidation, pleural effusion, or pneumothorax. No displaced rib fractures. IMPRESSION: Cardiomegaly and pulmonary vascular congestion. Electronically Signed   By: Minerva Fester M.D.   On: 10/23/2022 19:32   CT Lumbar Spine Wo Contrast  Result Date: 10/23/2022 CLINICAL DATA:  Low back pain. Multiple recent falls. Increasing weakness. EXAM: CT LUMBAR SPINE WITHOUT CONTRAST TECHNIQUE: Multidetector CT imaging of the lumbar spine was performed without intravenous contrast administration. Multiplanar CT image reconstructions were also generated. RADIATION DOSE REDUCTION: This exam was performed according to the departmental dose-optimization program which includes automated exposure control, adjustment of the mA and/or kV according to patient size and/or use of iterative reconstruction technique. COMPARISON:  Lumbar spine radiographs 05/15/2022. CT abdomen and pelvis 08/06/2021. Lumbar spine MRI 06/07/2014. FINDINGS: Segmentation: 5 lumbar type vertebrae. Alignment: Exaggerated lumbar lordosis. Mild lumbar levoscoliosis. Grade 1 anterolisthesis of L3 on L4 and L4 on L5, unchanged from the 2023 CT. Vertebrae: No acute fracture or suspicious osseous lesion. Multiple Schmorl's nodes, including a moderately large Schmorl's node involving the L2 superior endplate which is new from 2023. Paraspinal and other soft tissues: Aortic atherosclerosis. Uterine fibroids. 8 mm left renal calculus. Bilateral renal cysts  with no follow-up imaging required. Disc levels: Advanced multilevel disc and facet degeneration with vacuum disc phenomenon at every level except L2-3. Anterolisthesis with bulging uncovered disc and posterior element hypertrophy result in at least moderate spinal stenosis at L3-4 and severe spinal stenosis at L4-5, stable to mildly progressed from 2016. IMPRESSION: 1. No acute osseous abnormality. 2. 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 3. Nonobstructing left nephrolithiasis. 4.  Aortic Atherosclerosis (ICD10-I70.0). Electronically Signed   By: Sebastian Ache M.D.   On: 10/23/2022 17:16   CT Head Wo Contrast  Result Date: 10/23/2022 CLINICAL DATA:  Neuro deficit, acute, stroke suspected. Weakness, fall, back pain; Neck trauma (Age >= 65y). Worsening slurred speech over 1 week. Increasing weakness with multiple recent falls. EXAM: CT HEAD WITHOUT CONTRAST CT CERVICAL SPINE WITHOUT CONTRAST TECHNIQUE: Multidetector CT imaging of the head and cervical spine was performed following the standard protocol without intravenous contrast. Multiplanar CT image reconstructions of the cervical spine were also generated. RADIATION DOSE REDUCTION: This exam was performed according to the departmental dose-optimization program which includes automated exposure control, adjustment of the mA and/or kV according to patient size and/or use of iterative reconstruction technique. COMPARISON:  CT head and cervical spine 02/12/2018. MRI head 02/12/2018. MRI cervical spine 08/29/2020. FINDINGS: CT HEAD FINDINGS Brain: There is no evidence of an acute infarct, intracranial hemorrhage, mass, midline shift, or extra-axial fluid collection. Periventricular white matter hypodensities are similar to the prior CT and are nonspecific but compatible with mild chronic small vessel ischemic disease. A chronic lacunar infarct is again noted in the  left thalamus. There is moderately advanced cerebral atrophy. Vascular: Calcified atherosclerosis at the skull base. No hyperdense vessel. Skull: No acute fracture or suspicious osseous lesion. Sinuses/Orbits: Visualized paranasal sinuses and mastoid air cells are clear. Bilateral cataract extraction. Other: None. CT CERVICAL SPINE FINDINGS Alignment: Mild left convex curvature of the cervical  spine. Chronic grade 1 anterolisthesis of C2 on C3. Widening of the atlantodental interval to 5 mm, new from the 2020 CT but unchanged from the 2022 MRI and potentially secondary to C1-2 arthropathy or remote injury. Skull base and vertebrae: No acute fracture or suspicious osseous lesion. 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 Soft tissues and spinal canal: No prevertebral fluid or swelling. No visible canal hematoma. Disc levels: Widespread, advanced disc degeneration throughout the cervical spine. Interbody and bilateral facet ankylosis at C3-4. Mild-to-moderate multilevel spinal stenosis and moderate to severe multilevel neural foraminal stenosis as previously shown. Upper chest: Clear lung apices. Other: 2.3 cm hypodense nodule in the left thyroid lobe which has substantially enlarged from 2020 where it measured 1.2 cm. IMPRESSION: 1. No evidence of acute intracranial abnormality or acute cervical spine fracture. 2. Mild chronic small vessel ischemic disease and moderately advanced cerebral atrophy. 3. Advanced cervical disc degeneration. 4. 2.3 cm left thyroid nodule, increased in size from 2020. Consider thyroid ultrasound for further evaluation if clinically warranted, taking into account the patient's age and comorbidities. Electronically Signed   By: Sebastian Ache M.D.   On: 10/23/2022 17:08   CT Cervical Spine Wo Contrast  Result Date: 10/23/2022 CLINICAL DATA:  Neuro deficit, acute, stroke suspected. Weakness, fall, back pain; Neck trauma (Age >= 65y). Worsening slurred speech over 1 week.  Increasing weakness with multiple recent falls. EXAM: CT HEAD WITHOUT CONTRAST CT CERVICAL SPINE WITHOUT CONTRAST TECHNIQUE: Multidetector CT imaging of the head and cervical spine was performed following the standard protocol without intravenous contrast. Multiplanar CT image reconstructions of the cervical spine were also generated. RADIATION DOSE REDUCTION: This exam was performed according to the departmental dose-optimization program which includes automated exposure control, adjustment of the mA and/or kV according to patient size and/or use of iterative reconstruction technique. COMPARISON:  CT head and cervical spine 02/12/2018. MRI head 02/12/2018. MRI cervical spine 08/29/2020. FINDINGS: CT HEAD FINDINGS Brain: There is no evidence of an acute infarct, intracranial hemorrhage, mass, midline shift, or extra-axial fluid collection. Periventricular white matter hypodensities are similar to the prior CT and are nonspecific but compatible with mild chronic small vessel ischemic disease. 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 There is moderately advanced cerebral atrophy. Vascular: Calcified atherosclerosis at the skull base. No hyperdense vessel. Skull: No acute fracture or suspicious osseous lesion. Sinuses/Orbits: Visualized paranasal sinuses and mastoid air cells are clear. Bilateral cataract extraction. Other: None. CT CERVICAL SPINE FINDINGS Alignment: Mild left convex curvature of the cervical spine. Chronic grade 1 anterolisthesis of C2 on C3. Widening of the atlantodental interval to 5 mm, new from the 2020 CT but unchanged from the 2022 MRI and potentially secondary to C1-2 arthropathy or remote injury. Skull base and vertebrae: No acute fracture or suspicious osseous lesion. 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 Soft tissues and spinal canal: No prevertebral fluid or swelling. No visible canal hematoma. Disc levels: Widespread, advanced disc degeneration  throughout the cervical spine. Interbody and bilateral facet ankylosis at C3-4. Mild-to-moderate multilevel spinal stenosis and moderate to severe multilevel neural foraminal stenosis as previously shown. Upper chest: Clear lung apices. Other: 2.3 cm hypodense nodule in the left thyroid lobe which has substantially enlarged from 2020 where it measured 1.2 cm. IMPRESSION: 1. No evidence of acute intracranial abnormality or acute cervical spine fracture. 2. Mild chronic small vessel ischemic disease and moderately advanced cerebral atrophy. 3. Advanced cervical disc degeneration. 4.  2.3 cm left thyroid nodule, increased in size from 2020. Consider thyroid ultrasound for further evaluation if clinically warranted, taking into account the patient's age and comorbidities. Electronically Signed   By: Sebastian Ache M.D.   On: 10/23/2022 17:08      Assessment/Plan   Principal Problem:   AKI (acute kidney injury) (HCC)   ***            ***                ***                 ***               ***               ***               ***                ***               ***               ***               ***               ***              ***          ***  DVT prophylaxis: SCD's ***  Code Status: Full code*** Family Communication: none*** Disposition Plan: Per Rounding Team Consults called: none***;  Admission status: ***     I SPENT GREATER THAN 75 *** MINUTES IN CLINICAL CARE TIME/MEDICAL DECISION-MAKING IN COMPLETING THIS ADMISSION.      Chaney Born Karma Ansley DO [facility]  From 7PM - 7AM   10/23/2022, 10:45 PM   ***

## 2022-10-23 NOTE — ED Provider Notes (Signed)
[place] EMERGENCY DEPARTMENT AT [facility] Provider Note   CSN: [id number] Arrival date & time: 10/23/22  1321     History  Chief Complaint  Patient presents with   Weakness   Fall    Susan [name] is a 82 y.o. female.  Patient with history of HFpEF on furosemide, chronic kidney disease (baseline creatinine 1.3-1.5), afib, history of stroke, dementia, anemia of chronic disease --presents to the emergency department today for evaluation of generalized weakness.  Patient began having lower back pain about 1 week ago.  Patient was seen by family member 3 days ago and she was noted to have more delayed/slurred speech and was walking very slowly.  She uses a walker/rollator to ambulate at baseline.  Patient has also had a decreased appetite as of late and family member thought that the changes may have been related to that.  Patient had a fall from her bed early this morning and could not get up.  EMS was called and she refused transport.  Later in the morning, she could not stand up off of her toilet due to weakness and pain in her legs.  Due to worsening symptoms, home health aide and patient's daughter brought her to the hospital.  She needed lifted into the car.  Typically she would be able to assist with this. Patient denies signs of stroke including: facial droop, slurred speech, aphasia, weakness/numbness in extremities.        Home Medications Prior to Admission medications   Medication Sig Start Date End Date Taking? Authorizing Provider  albuterol (VENTOLIN HFA) 108 (90 Base) MCG/ACT inhaler Inhale into the lungs. 02/08/21   [provider]  azelastine (ASTELIN) 0.1 % nasal spray Place 1 spray into both nostrils 2 (two) times daily as needed for rhinitis. Use in each nostril as directed    [provider]  busPIRone (BUSPAR) 15 MG tablet Take 15 mg by mouth 2 (two) times daily.    [provider]  Calcium Carb-Cholecalciferol 600-10 MG-MCG  TABS Take 1 tablet by mouth 2 (two) times daily. 06/20/21   [provider]  carvedilol (COREG) 6.25 MG tablet TAKE 1 TABLET BY MOUTH 2 TIMES DAILY 08/06/22   Quintella Reichert, MD  cetirizine (ZYRTEC) 10 MG tablet Take 10 mg by mouth daily. 06/20/21   [provider]  diclofenac sodium (VOLTAREN) 1 % GEL Apply 2 g topically 4 (four) times daily. 05/25/18   Sharon Seller, NP  donepezil (ARICEPT) 10 MG tablet Take 1 tablet (10 mg total) by mouth at bedtime. For memory loss 05/25/18   Sharon Seller, NP  ELIQUIS 2.5 MG TABS tablet TAKE 1 TABLET BY MOUTH 2 TIMES DAILY 01/04/22   Tereso Newcomer T, PA-C  FEROSUL 325 (65 Fe) MG tablet Take 325 mg by mouth 2 (two) times daily. 06/21/21   [provider]  furosemide (LASIX) 40 MG tablet Take 40 mg by mouth daily.    [provider]  hydrALAZINE (APRESOLINE) 25 MG tablet TAKE 1 TABLET BY MOUTH 3 TIMES DAILY Patient taking differently: Take 25 mg by mouth 3 (three) times daily. 06/17/18   Sharon Seller, NP  losartan (COZAAR) 50 MG tablet TAKE 1 TABLET BY MOUTH EVERY DAY Patient taking differently: Take 50 mg by mouth daily. 08/07/18   Sharon Seller, NP  Menthol, Topical Analgesic, (BIOFREEZE) 4 % GEL Apply 3 oz topically 3 (three) times daily. For knee pain Patient taking differently: Apply 1 application  topically  3 (three) times daily as needed (knee pain). 09/25/17   Ngetich, Dinah C, NP  methocarbamol (ROBAXIN) 500 MG tablet Take 1 tablet (500 mg total) by mouth 2 (two) times daily as needed. 12/14/19   Cristie Hem, PA-C  Multiple Vitamin (GNP ESSENTIAL ONE DAILY) TABS TAKE 1 TABLET BY MOUTH EVERY DAY Patient taking differently: Take 1 tablet by mouth daily. 06/17/18   Sharon Seller, NP  pravastatin (PRAVACHOL) 40 MG tablet TAKE 1 TABLET BY MOUTH EVERY DAY Patient taking differently: Take 40 mg by mouth daily. 06/17/18   Sharon Seller, NP  sertraline (ZOLOFT) 100 MG tablet Take 100 mg by mouth  daily.    [provider]  SYMBICORT 80-4.5 MCG/ACT inhaler Inhale 2 puffs into the lungs. 02/08/21   [provider]      Allergies    Ace inhibitors, Lipitor [atorvastatin], Simvastatin, and Latex    Review of Systems   Review of Systems  Physical Exam Updated Vital Signs BP (!) 154/70 (BP Location: Right Arm)   Pulse (!) 55   Temp 97.6 F (36.4 C)   Resp 17   Ht 5\' 1"  (1.549 m)   Wt 68.5 kg   SpO2 100%   BMI 28.53 kg/m   Physical Exam Vitals and nursing note reviewed.  Constitutional:      General: She is not in acute distress.    Appearance: She is well-developed.  HENT:     Head: Normocephalic and atraumatic.     Right Ear: External ear normal.     Left Ear: External ear normal.     Nose: Nose normal.  Eyes:     Conjunctiva/sclera: Conjunctivae normal.  Cardiovascular:     Rate and Rhythm: Normal rate and regular rhythm.     Heart sounds: No murmur heard. Pulmonary:     Effort: No respiratory distress.     Breath sounds: No wheezing, rhonchi or rales.  Abdominal:     Palpations: Abdomen is soft.     Tenderness: There is no abdominal tenderness. There is no guarding or rebound.  Musculoskeletal:     Cervical back: Normal range of motion and neck supple. No tenderness. Normal range of motion.     Thoracic back: Tenderness and bony tenderness present.     Lumbar back: Tenderness and bony tenderness present.       Back:     Right lower leg: Edema present.     Left lower leg: Edema present.     Comments: Venous stasis changes noted, left greater than right.  Patient does have 1-2+ pitting edema of the lower legs within the patient's from her socks.  Tenderness to palpation of the very low thoracic spine and lumbar spine to palpation.  No step-offs.  Scoliosis palpated.  Skin:    General: Skin is warm and dry.     Findings: No rash.  Neurological:     General: No focal deficit present.     Mental Status: She is alert. Mental status is at  baseline.     Motor: Weakness present.     Comments: No focal weakness but patient noted to be generally weak.  She is unable to lift her legs off the bed against gravity.  Psychiatric:        Mood and Affect: Mood normal.     ED Results / Procedures / Treatments   Labs (all labs ordered are listed, but only abnormal results are displayed) Labs Reviewed  BASIC METABOLIC PANEL -  Abnormal; Notable for the following components:      Result Value   Glucose, Bld 124 (*)    BUN 40 (*)    Creatinine, Ser 2.27 (*)    Calcium 10.7 (*)    GFR, Estimated 21 (*)    All other components within normal limits  CBC - Abnormal; Notable for the following components:   WBC 3.8 (*)    RBC 3.69 (*)    Hemoglobin 11.5 (*)    HCT 34.6 (*)    All other components within normal limits  URINALYSIS, ROUTINE W REFLEX MICROSCOPIC - Abnormal; Notable for the following components:   Ketones, ur TRACE (*)    Protein, ur TRACE (*)    Leukocytes,Ua MODERATE (*)    All other components within normal limits  CBG MONITORING, ED - Abnormal; Notable for the following components:   Glucose-Capillary 108 (*)    All other components within normal limits  TROPONIN I (HIGH SENSITIVITY) - Abnormal; Notable for the following components:   Troponin I (High Sensitivity) 30 (*)    All other components within normal limits  TROPONIN I (HIGH SENSITIVITY) - Abnormal; Notable for the following components:   Troponin I (High Sensitivity) 33 (*)    All other components within normal limits  URINE CULTURE  CBG MONITORING, ED    ED ECG REPORT   Date: 10/23/2022  Rate: 54  Rhythm: sinus bradycardia  QRS Axis: left  Intervals: normal  ST/T Wave abnormalities: nonspecific T wave changes  Conduction Disutrbances:right bundle branch block and left anterior fascicular block  Narrative Interpretation:   Old EKG Reviewed: unchanged  I have personally reviewed the EKG tracing and agree with the computerized printout as  noted.   Radiology CT Lumbar Spine Wo Contrast  Result Date: 10/23/2022 CLINICAL DATA:  Low back pain. Multiple recent falls. Increasing weakness. EXAM: CT LUMBAR SPINE WITHOUT CONTRAST TECHNIQUE: Multidetector CT imaging of the lumbar spine was performed without intravenous contrast administration. Multiplanar CT image reconstructions were also generated. RADIATION DOSE REDUCTION: This exam was performed according to the departmental dose-optimization program which includes automated exposure control, adjustment of the mA and/or kV according to patient size and/or use of iterative reconstruction technique. COMPARISON:  Lumbar spine radiographs 05/15/2022. CT abdomen and pelvis 08/06/2021. Lumbar spine MRI 06/07/2014. FINDINGS: Segmentation: 5 lumbar type vertebrae. Alignment: Exaggerated lumbar lordosis. Mild lumbar levoscoliosis. Grade 1 anterolisthesis of L3 on L4 and L4 on L5, unchanged from the 2023 CT. Vertebrae: No acute fracture or suspicious osseous lesion. Multiple Schmorl's nodes, including a moderately large Schmorl's node involving the L2 superior endplate which is new from 2023. Paraspinal and other soft tissues: Aortic atherosclerosis. Uterine fibroids. 8 mm left renal calculus. Bilateral renal cysts with no follow-up imaging required. Disc levels: Advanced multilevel disc and facet degeneration with vacuum disc phenomenon at every level except L2-3. Anterolisthesis with bulging uncovered disc and posterior element hypertrophy result in at least moderate spinal stenosis at L3-4 and severe spinal stenosis at L4-5, stable to mildly progressed from 2016. IMPRESSION: 1. No acute osseous abnormality. 2. 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 3. Nonobstructing left nephrolithiasis. 4.  Aortic Atherosclerosis (ICD10-I70.0). Electronically Signed   By: Sebastian Ache M.D.   On: 10/23/2022 17:16   CT Head  Wo Contrast  Result Date: 10/23/2022 CLINICAL DATA:  Neuro deficit, acute, stroke suspected. Weakness, fall, back pain; Neck trauma (Age >= 65y). Worsening slurred  speech over 1 week. Increasing weakness with multiple recent falls. EXAM: CT HEAD WITHOUT CONTRAST CT CERVICAL SPINE WITHOUT CONTRAST TECHNIQUE: Multidetector CT imaging of the head and cervical spine was performed following the standard protocol without intravenous contrast. Multiplanar CT image reconstructions of the cervical spine were also generated. RADIATION DOSE REDUCTION: This exam was performed according to the departmental dose-optimization program which includes automated exposure control, adjustment of the mA and/or kV according to patient size and/or use of iterative reconstruction technique. COMPARISON:  CT head and cervical spine 02/12/2018. MRI head 02/12/2018. MRI cervical spine 08/29/2020. FINDINGS: CT HEAD FINDINGS Brain: There is no evidence of an acute infarct, intracranial hemorrhage, mass, midline shift, or extra-axial fluid collection. Periventricular white matter hypodensities are similar to the prior CT and are nonspecific but compatible with mild chronic small vessel ischemic disease. 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 There is moderately advanced cerebral atrophy. Vascular: Calcified atherosclerosis at the skull base. No hyperdense vessel. Skull: No acute fracture or suspicious osseous lesion. Sinuses/Orbits: Visualized paranasal sinuses and mastoid air cells are clear. Bilateral cataract extraction. Other: None. CT CERVICAL SPINE FINDINGS Alignment: Mild left convex curvature of the cervical spine. Chronic grade 1 anterolisthesis of C2 on C3. Widening of the atlantodental interval to 5 mm, new from the 2020 CT but unchanged from the 2022 MRI and potentially secondary to C1-2 arthropathy or remote injury. Skull base and vertebrae: No acute fracture or suspicious osseous lesion. 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 Soft tissues and spinal canal: No prevertebral fluid or swelling. No visible canal hematoma. Disc levels: Widespread, advanced disc degeneration throughout the cervical spine. Interbody and bilateral facet ankylosis at C3-4. Mild-to-moderate multilevel spinal stenosis and moderate to severe multilevel neural foraminal stenosis as previously shown. Upper chest: Clear lung apices. Other: 2.3 cm hypodense nodule in the left thyroid lobe which has substantially enlarged from 2020 where it measured 1.2 cm. IMPRESSION: 1. No evidence of acute intracranial abnormality or acute cervical spine fracture. 2. Mild chronic small vessel ischemic disease and moderately advanced cerebral atrophy. 3. Advanced cervical disc degeneration. 4. 2.3 cm left thyroid nodule, increased in size from 2020. Consider thyroid ultrasound for further evaluation if clinically warranted, taking into account the patient's age and comorbidities. Electronically Signed   By: Sebastian Ache M.D.   On: 10/23/2022 17:08   CT Cervical Spine Wo Contrast  Result Date: 10/23/2022 CLINICAL DATA:  Neuro deficit, acute, stroke suspected. Weakness, fall, back pain; Neck trauma (Age >= 65y). Worsening slurred speech over 1 week. Increasing weakness with multiple recent falls. EXAM: CT HEAD WITHOUT CONTRAST CT CERVICAL SPINE WITHOUT CONTRAST TECHNIQUE: Multidetector CT imaging of the head and cervical spine was performed following the standard protocol without intravenous contrast. Multiplanar CT image reconstructions of the cervical spine were also generated. RADIATION DOSE REDUCTION: This exam was performed according to the departmental dose-optimization program which includes automated exposure control, adjustment of the mA and/or kV according to patient size and/or use of iterative reconstruction technique. COMPARISON:  CT head and cervical spine 02/12/2018. MRI head 02/12/2018. MRI cervical spine 08/29/2020. FINDINGS: CT  HEAD FINDINGS Brain: There is no evidence of an acute infarct, intracranial hemorrhage, mass, midline shift, or extra-axial fluid collection. Periventricular white matter hypodensities are similar to the prior CT and are nonspecific but compatible with mild chronic small vessel ischemic disease. 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 There is moderately advanced cerebral atrophy. Vascular:  Calcified atherosclerosis at the skull base. No hyperdense vessel. Skull: No acute fracture or suspicious osseous lesion. Sinuses/Orbits: Visualized paranasal sinuses and mastoid air cells are clear. Bilateral cataract extraction. Other: None. CT CERVICAL SPINE FINDINGS Alignment: Mild left convex curvature of the cervical spine. Chronic grade 1 anterolisthesis of C2 on C3. Widening of the atlantodental interval to 5 mm, new from the 2020 CT but unchanged from the 2022 MRI and potentially secondary to C1-2 arthropathy or remote injury. Skull base and vertebrae: No acute fracture or suspicious osseous lesion. 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 Soft tissues and spinal canal: No prevertebral fluid or swelling. No visible canal hematoma. Disc levels: Widespread, advanced disc degeneration throughout the cervical spine. Interbody and bilateral facet ankylosis at C3-4. Mild-to-moderate multilevel spinal stenosis and moderate to severe multilevel neural foraminal stenosis as previously shown. Upper chest: Clear lung apices. Other: 2.3 cm hypodense nodule in the left thyroid lobe which has substantially enlarged from 2020 where it measured 1.2 cm. IMPRESSION: 1. No evidence of acute intracranial abnormality or acute cervical spine fracture. 2. Mild chronic small vessel ischemic disease and moderately advanced cerebral atrophy. 3. Advanced cervical disc degeneration. 4. 2.3 cm left thyroid nodule, increased in size from 2020. Consider thyroid ultrasound for further evaluation if clinically  warranted, taking into account the patient's age and comorbidities. Electronically Signed   By: Sebastian Ache M.D.   On: 10/23/2022 17:08    Procedures Procedures    Medications Ordered in ED Medications - No data to display  ED Course/ Medical Decision Making/ A&P    Patient seen and examined. History obtained directly from patient. Work-up including labs, imaging, EKG ordered in triage, if performed, were reviewed.    Labs/EKG: Independently reviewed and interpreted.  This included: CBC, BMP, troponin, UA  Imaging: Independently visualized and interpreted.  This included: CT head, CT cervical spine, CT lumbar spine, chest x-ray  Medications/Fluids: Ordered: IV fluid bolus  Most recent vital signs reviewed and are as follows: BP (!) 166/68   Pulse (!) 50   Temp 97.6 F (36.4 C)   Resp 17   Ht 5\' 1"  (1.549 m)   Wt 68.5 kg   SpO2 99%   BMI 28.53 kg/m   Initial impression: Generalized weakness, question of dysarthria and patient with previous stroke  6:40 PM Reassessment performed. Patient appears stable.  Labs personally reviewed and interpreted including: CBC with differential shows slightly low white blood cell count 3.8, mild anemia at 11.5 hemoglobin otherwise unremarkable; BMP normal electrolytes with glucose 124 with normal anion gap, creatinine elevated from baseline to 2.27 with a BUN of 40; troponin 33 >> 30; UA with moderate amount of white blood cell.  Imaging personally visualized and interpreted including: CT head, CT cervical spine CT lumbar spine, agree no acute findings.  Chest x-ray without obvious infiltrate.  Reviewed pertinent lab work and imaging with patient at bedside. Questions answered.   Most current vital signs reviewed and are as follows: BP (!) 174/78   Pulse (!) 47   Temp 97.6 F (36.4 C)   Resp 18   Ht 5\' 1"  (1.549 m)   Wt 68.5 kg   SpO2 99%   BMI 28.53 kg/m   Plan: Will plan to admit to hospital for AKI and generalized weakness,  stroke workup.  7:20 PM Pending admission callback. Signout with Smoot PA-C at shift change who will coordinate with Hospitalist service for admission.  Medical Decision Making Amount and/or Complexity of Data Reviewed Labs: ordered. Radiology: ordered.  Risk Decision regarding hospitalization.   Patient with generalized weakness with acute on chronic kidney injury.  She is on diuretics and this may represent a commendation of overdiuresis in setting of poor oral intake.  Will hold Lasix and losartan currently.  She will be gently hydrated and admitted.  There is also concern for acute CVA given some speech changes that she had over the weekend and will need further workup for this likely in the hospital.        Final Clinical Impression(s) / ED Diagnoses Final diagnoses:  Acute kidney injury [facility])  Dysarthria  Generalized weakness    Rx / DC Orders ED Discharge Orders     None         Renne Crigler, PA-C 10/24/22 0941    Royanne Foots, DO 10/28/22 [phone]

## 2022-10-23 NOTE — ED Triage Notes (Addendum)
Family notices worsening slurred speech over the course of a week. Increasing weakness and falls recently. No acute neuro changes today seems to be progressing- fell this morning and EMS came out twice- could not get off toilet. Complains on back pain. On eliquis- denies hitting head during falls- no injury observed. A+Ox4. -N/-V/-D, denies urinary changes/complaints. Tested negative for COVID (home test) last night.  Med hx HTN, CVA 10 years ago, peripheral neuropathy.

## 2022-10-24 ENCOUNTER — Inpatient Hospital Stay (HOSPITAL_COMMUNITY): Payer: Medicare Other

## 2022-10-24 ENCOUNTER — Encounter (HOSPITAL_COMMUNITY): Payer: Self-pay | Admitting: Family Medicine

## 2022-10-24 ENCOUNTER — Other Ambulatory Visit: Payer: Self-pay

## 2022-10-24 ENCOUNTER — Observation Stay (HOSPITAL_COMMUNITY): Payer: Medicare Other

## 2022-10-24 DIAGNOSIS — N179 Acute kidney failure, unspecified: Secondary | ICD-10-CM | POA: Diagnosis present

## 2022-10-24 DIAGNOSIS — I5032 Chronic diastolic (congestive) heart failure: Secondary | ICD-10-CM | POA: Diagnosis present

## 2022-10-24 DIAGNOSIS — Z79899 Other long term (current) drug therapy: Secondary | ICD-10-CM | POA: Diagnosis not present

## 2022-10-24 DIAGNOSIS — E860 Dehydration: Secondary | ICD-10-CM | POA: Diagnosis present

## 2022-10-24 DIAGNOSIS — I130 Hypertensive heart and chronic kidney disease with heart failure and stage 1 through stage 4 chronic kidney disease, or unspecified chronic kidney disease: Secondary | ICD-10-CM | POA: Diagnosis present

## 2022-10-24 DIAGNOSIS — N1832 Chronic kidney disease, stage 3b: Secondary | ICD-10-CM | POA: Diagnosis present

## 2022-10-24 DIAGNOSIS — I69351 Hemiplegia and hemiparesis following cerebral infarction affecting right dominant side: Secondary | ICD-10-CM | POA: Diagnosis not present

## 2022-10-24 DIAGNOSIS — I4819 Other persistent atrial fibrillation: Secondary | ICD-10-CM | POA: Diagnosis present

## 2022-10-24 DIAGNOSIS — F0150 Vascular dementia without behavioral disturbance: Secondary | ICD-10-CM | POA: Diagnosis present

## 2022-10-24 DIAGNOSIS — Z87891 Personal history of nicotine dependence: Secondary | ICD-10-CM | POA: Diagnosis not present

## 2022-10-24 DIAGNOSIS — Z96653 Presence of artificial knee joint, bilateral: Secondary | ICD-10-CM | POA: Diagnosis present

## 2022-10-24 DIAGNOSIS — Y92013 Bedroom of single-family (private) house as the place of occurrence of the external cause: Secondary | ICD-10-CM | POA: Diagnosis not present

## 2022-10-24 DIAGNOSIS — G8929 Other chronic pain: Secondary | ICD-10-CM | POA: Diagnosis present

## 2022-10-24 DIAGNOSIS — M79606 Pain in leg, unspecified: Secondary | ICD-10-CM | POA: Diagnosis not present

## 2022-10-24 DIAGNOSIS — W06XXXA Fall from bed, initial encounter: Secondary | ICD-10-CM | POA: Diagnosis present

## 2022-10-24 DIAGNOSIS — E876 Hypokalemia: Secondary | ICD-10-CM | POA: Diagnosis present

## 2022-10-24 DIAGNOSIS — I480 Paroxysmal atrial fibrillation: Secondary | ICD-10-CM | POA: Diagnosis present

## 2022-10-24 DIAGNOSIS — E785 Hyperlipidemia, unspecified: Secondary | ICD-10-CM | POA: Diagnosis present

## 2022-10-24 DIAGNOSIS — Z801 Family history of malignant neoplasm of trachea, bronchus and lung: Secondary | ICD-10-CM | POA: Diagnosis not present

## 2022-10-24 DIAGNOSIS — R8281 Pyuria: Secondary | ICD-10-CM | POA: Diagnosis present

## 2022-10-24 DIAGNOSIS — R531 Weakness: Secondary | ICD-10-CM

## 2022-10-24 DIAGNOSIS — M47819 Spondylosis without myelopathy or radiculopathy, site unspecified: Secondary | ICD-10-CM | POA: Diagnosis present

## 2022-10-24 DIAGNOSIS — R7989 Other specified abnormal findings of blood chemistry: Secondary | ICD-10-CM | POA: Diagnosis present

## 2022-10-24 DIAGNOSIS — Y92003 Bedroom of unspecified non-institutional (private) residence as the place of occurrence of the external cause: Secondary | ICD-10-CM | POA: Diagnosis not present

## 2022-10-24 DIAGNOSIS — Z7901 Long term (current) use of anticoagulants: Secondary | ICD-10-CM | POA: Diagnosis not present

## 2022-10-24 DIAGNOSIS — W19XXXA Unspecified fall, initial encounter: Secondary | ICD-10-CM

## 2022-10-24 DIAGNOSIS — M48061 Spinal stenosis, lumbar region without neurogenic claudication: Secondary | ICD-10-CM | POA: Diagnosis present

## 2022-10-24 DIAGNOSIS — I452 Bifascicular block: Secondary | ICD-10-CM | POA: Diagnosis present

## 2022-10-24 DIAGNOSIS — N170 Acute kidney failure with tubular necrosis: Secondary | ICD-10-CM | POA: Diagnosis not present

## 2022-10-24 DIAGNOSIS — D631 Anemia in chronic kidney disease: Secondary | ICD-10-CM | POA: Diagnosis present

## 2022-10-24 DIAGNOSIS — Z7951 Long term (current) use of inhaled steroids: Secondary | ICD-10-CM | POA: Diagnosis not present

## 2022-10-24 DIAGNOSIS — E861 Hypovolemia: Secondary | ICD-10-CM | POA: Diagnosis present

## 2022-10-24 DIAGNOSIS — E210 Primary hyperparathyroidism: Secondary | ICD-10-CM | POA: Diagnosis present

## 2022-10-24 DIAGNOSIS — G629 Polyneuropathy, unspecified: Secondary | ICD-10-CM | POA: Diagnosis present

## 2022-10-24 LAB — CBC WITH DIFFERENTIAL/PLATELET
Abs Immature Granulocytes: 0.01 10*3/uL (ref 0.00–0.07)
Basophils Absolute: 0 10*3/uL (ref 0.0–0.1)
Basophils Relative: 1 %
Eosinophils Absolute: 0.1 10*3/uL (ref 0.0–0.5)
Eosinophils Relative: 3 %
HCT: 30.8 % — ABNORMAL LOW (ref 36.0–46.0)
Hemoglobin: 10.4 g/dL — ABNORMAL LOW (ref 12.0–15.0)
Immature Granulocytes: 0 %
Lymphocytes Relative: 22 %
Lymphs Abs: 0.7 10*3/uL (ref 0.7–4.0)
MCH: 31 pg (ref 26.0–34.0)
MCHC: 33.8 g/dL (ref 30.0–36.0)
MCV: 91.7 fL (ref 80.0–100.0)
Monocytes Absolute: 0.5 10*3/uL (ref 0.1–1.0)
Monocytes Relative: 14 %
Neutro Abs: 2.1 10*3/uL (ref 1.7–7.7)
Neutrophils Relative %: 60 %
Platelets: 187 10*3/uL (ref 150–400)
RBC: 3.36 MIL/uL — ABNORMAL LOW (ref 3.87–5.11)
RDW: 13 % (ref 11.5–15.5)
WBC: 3.4 10*3/uL — ABNORMAL LOW (ref 4.0–10.5)
nRBC: 0 % (ref 0.0–0.2)

## 2022-10-24 LAB — COMPREHENSIVE METABOLIC PANEL
ALT: 23 U/L (ref 0–44)
AST: 43 U/L — ABNORMAL HIGH (ref 15–41)
Albumin: 3.3 g/dL — ABNORMAL LOW (ref 3.5–5.0)
Alkaline Phosphatase: 47 U/L (ref 38–126)
Anion gap: 11 (ref 5–15)
BUN: 28 mg/dL — ABNORMAL HIGH (ref 8–23)
CO2: 23 mmol/L (ref 22–32)
Calcium: 10.6 mg/dL — ABNORMAL HIGH (ref 8.9–10.3)
Chloride: 108 mmol/L (ref 98–111)
Creatinine, Ser: 1.71 mg/dL — ABNORMAL HIGH (ref 0.44–1.00)
GFR, Estimated: 29 mL/min — ABNORMAL LOW (ref >60)
Glucose, Bld: 100 mg/dL — ABNORMAL HIGH (ref 70–99)
Potassium: 3.1 mmol/L — ABNORMAL LOW (ref 3.5–5.1)
Sodium: 142 mmol/L (ref 135–145)
Total Bilirubin: 0.5 mg/dL (ref 0.3–1.2)
Total Protein: 5.9 g/dL — ABNORMAL LOW (ref 6.5–8.1)

## 2022-10-24 LAB — URINE CULTURE: Culture: 10000 — AB

## 2022-10-24 LAB — TSH: TSH: 0.735 u[IU]/mL (ref 0.350–4.500)

## 2022-10-24 LAB — PHOSPHORUS: Phosphorus: 2.8 mg/dL (ref 2.5–4.6)

## 2022-10-24 LAB — CK: Total CK: 599 U/L — ABNORMAL HIGH (ref 38–234)

## 2022-10-24 LAB — MAGNESIUM: Magnesium: 1.8 mg/dL (ref 1.7–2.4)

## 2022-10-24 LAB — VITAMIN B12: Vitamin B-12: 837 pg/mL (ref 180–914)

## 2022-10-24 MED ORDER — MOMETASONE FURO-FORMOTEROL FUM 100-5 MCG/ACT IN AERO
2.0000 | INHALATION_SPRAY | Freq: Two times a day (BID) | RESPIRATORY_TRACT | Status: DC
  Administered 2022-10-25 – 2022-10-30 (×10): 2 via RESPIRATORY_TRACT
  Filled 2022-10-24: qty 8.8

## 2022-10-24 MED ORDER — SERTRALINE HCL 100 MG PO TABS
100.0000 mg | ORAL_TABLET | Freq: Every day | ORAL | Status: DC
  Administered 2022-10-24 – 2022-10-30 (×7): 100 mg via ORAL
  Filled 2022-10-24 (×8): qty 1

## 2022-10-24 MED ORDER — BUSPIRONE HCL 5 MG PO TABS
15.0000 mg | ORAL_TABLET | Freq: Two times a day (BID) | ORAL | Status: DC
  Administered 2022-10-24 – 2022-10-30 (×13): 15 mg via ORAL
  Filled 2022-10-24 (×13): qty 3

## 2022-10-24 MED ORDER — LACTATED RINGERS IV SOLN: INTRAVENOUS | Status: AC

## 2022-10-24 MED ORDER — PRAVASTATIN SODIUM 40 MG PO TABS
40.0000 mg | ORAL_TABLET | Freq: Every day | ORAL | Status: DC
  Administered 2022-10-24 – 2022-10-30 (×7): 40 mg via ORAL
  Filled 2022-10-24 (×7): qty 1

## 2022-10-24 MED ORDER — ORAL CARE MOUTH RINSE: 15.0000 mL | OROMUCOSAL | Status: DC | PRN

## 2022-10-24 MED ORDER — POTASSIUM CHLORIDE CRYS ER 20 MEQ PO TBCR
20.0000 meq | EXTENDED_RELEASE_TABLET | Freq: Once | ORAL | Status: AC
  Administered 2022-10-24: 20 meq via ORAL
  Filled 2022-10-24: qty 1

## 2022-10-24 MED ORDER — FERROUS SULFATE 325 (65 FE) MG PO TABS
325.0000 mg | ORAL_TABLET | Freq: Two times a day (BID) | ORAL | Status: DC
  Administered 2022-10-24 – 2022-10-30 (×13): 325 mg via ORAL
  Filled 2022-10-24 (×13): qty 1

## 2022-10-24 MED ORDER — APIXABAN 2.5 MG PO TABS
2.5000 mg | ORAL_TABLET | Freq: Two times a day (BID) | ORAL | Status: DC
  Administered 2022-10-24 – 2022-10-30 (×13): 2.5 mg via ORAL
  Filled 2022-10-24 (×13): qty 1

## 2022-10-24 MED ORDER — ALBUTEROL SULFATE (2.5 MG/3ML) 0.083% IN NEBU: 2.5000 mg | INHALATION_SOLUTION | Freq: Four times a day (QID) | RESPIRATORY_TRACT | Status: DC | PRN

## 2022-10-24 MED ORDER — DONEPEZIL HCL 10 MG PO TABS
10.0000 mg | ORAL_TABLET | Freq: Every day | ORAL | Status: DC
  Administered 2022-10-24 – 2022-10-29 (×6): 10 mg via ORAL
  Filled 2022-10-24 (×6): qty 1

## 2022-10-24 MED ORDER — HYDRALAZINE HCL 25 MG PO TABS
25.0000 mg | ORAL_TABLET | Freq: Three times a day (TID) | ORAL | Status: DC
  Administered 2022-10-24 – 2022-10-30 (×19): 25 mg via ORAL
  Filled 2022-10-24 (×19): qty 1

## 2022-10-24 MED ORDER — MAGNESIUM SULFATE 2 GM/50ML IV SOLN
2.0000 g | Freq: Once | INTRAVENOUS | Status: AC
  Administered 2022-10-24: 2 g via INTRAVENOUS
  Filled 2022-10-24: qty 50

## 2022-10-24 NOTE — Evaluation (Signed)
Physical Therapy Evaluation Patient Details Name: Susan [name] MRN: [id number] DOB: [birth year] Today's Date: 10/24/2022  History of Present Illness  Patient is a 82 year old female with generalized weakness,  AKI superimposed on CKD 3B. Two recent falls. History of chronic diastolic heart failure, hyperlipidemia, paroxysmal atrial fibrillation, anemia, CKD.   Clinical Impression  Patient is agreeable to PT evaluation. She reports she is independent at baseline and lives alone. She is ambulatory with 4 wheeled walker and reports 2 recent falls at home where she had to call EMS to assist with getting her off the floor. She lives in an apartment with an Engineer, structural.  Today, the patient has generalized weakness with mobility. She required assistance for bed mobility and transfers. Three standing bouts performed and maximal assistance was required with each stand. Standing balance required maximal assistance with heavy posterior lean. Unable to pivot or take steps today. The patient is not at her baseline level of functional independence. Recommend PT follow up to maximize independence and facilitate return to prior level of function. Anticipate patient will require rehabilitation after this hospital stay < 3 hours/day. Patient will need physical assistance if going home.       If plan is discharge home, recommend the following: Two people to help with walking and/or transfers;A lot of help with bathing/dressing/bathroom;Assistance with cooking/housework;Help with stairs or ramp for entrance;Assist for transportation   Can travel by private vehicle   No    Equipment Recommendations None recommended by PT  Recommendations for Other Services       Functional Status Assessment Patient has had a recent decline in their functional status and demonstrates the ability to make significant improvements in function in a reasonable and predictable amount of time.     Precautions / Restrictions  Precautions Precautions: Fall Restrictions Weight Bearing Restrictions: No      Mobility  Bed Mobility Overal bed mobility: Needs Assistance Bed Mobility: Supine to Sit, Sit to Supine     Supine to sit: Mod assist, HOB elevated Sit to supine: HOB elevated, Mod assist   General bed mobility comments: verbal cues for technique, sequencing. increased time and effort required    Transfers Overall transfer level: Needs assistance Equipment used: Rolling walker (2 wheels), None Transfers: Sit to/from Stand Sit to Stand: Max assist           General transfer comment: 3 standing bouts performed from bed with bed elevated. patient is unable to stand without extensive assistance. unable to pivot to bed side commode due to generalized weakness and posterior lean with standing    Ambulation/Gait               General Gait Details: unable to take any steps at this time  Stairs            Wheelchair Mobility     Tilt Bed    Modified Rankin (Stroke Patients Only)       Balance Overall balance assessment: Needs assistance Sitting-balance support: Feet supported Sitting balance-Leahy Scale: Fair   Postural control: Posterior lean Standing balance support: Reliant on assistive device for balance, Bilateral upper extremity supported, During functional activity Standing balance-Leahy Scale: Zero Standing balance comment: maximal assistance required to maintain midline with cues and faciliation for anterior weight shifting                             Pertinent Vitals/Pain Pain Assessment Pain Assessment: Faces Faces Pain  Scale: Hurts a little bit Pain Location: generalized with movement Pain Descriptors / Indicators: Discomfort    Home Living Family/patient expects to be discharged to:: Private residence Living Arrangements: Alone Available Help at Discharge: Family;Available PRN/intermittently Type of Home: Apartment Home Access: Elevator        Home Layout: One level Home Equipment: Rollator (4 wheels);Cane - single point;Shower seat      Prior Function Prior Level of Function : Independent/Modified Independent;History of Falls (last six months)             Mobility Comments: 2 recent falls at home where she had to call EMS to assist getting up. Usually Mod I with ambulation using 4 wheeled walker. does not drive- gets groceries delivered ADLs Comments: independent per patient report     Extremity/Trunk Assessment   Upper Extremity Assessment Upper Extremity Assessment: Defer to OT evaluation    Lower Extremity Assessment Lower Extremity Assessment: Generalized weakness       Communication   Communication Communication: No apparent difficulties  Cognition Arousal: Alert Behavior During Therapy: WFL for tasks assessed/performed Overall Cognitive Status: No family/caregiver present to determine baseline cognitive functioning                                 General Comments: grossly WFL. patient does have decreased awareness of her physical limitations and need for physical assistance with mobility        General Comments      Exercises     Assessment/Plan    PT Assessment Patient needs continued PT services  PT Problem List         PT Treatment Interventions DME instruction;Gait training;Functional mobility training;Therapeutic activities;Therapeutic exercise;Balance training;Neuromuscular re-education;Cognitive remediation;Patient/family education    PT Goals (Current goals can be found in the Care Plan section)  Acute Rehab PT Goals Patient Stated Goal: to return home PT Goal Formulation: With patient Time For Goal Achievement: 10/31/22 Potential to Achieve Goals: Fair    Frequency Min 1X/week     Co-evaluation               AM-PAC PT "6 Clicks" Mobility  Outcome Measure Help needed turning from your back to your side while in a flat bed without using bedrails?: A  Lot Help needed moving from lying on your back to sitting on the side of a flat bed without using bedrails?: A Lot Help needed moving to and from a bed to a chair (including a wheelchair)?: Total Help needed standing up from a chair using your arms (e.g., wheelchair or bedside chair)?: Total Help needed to walk in hospital room?: Total Help needed climbing 3-5 steps with a railing? : Total 6 Click Score: 8    End of Session Equipment Utilized During Treatment: Gait belt Activity Tolerance: Patient limited by fatigue Patient left: in bed;with call bell/phone within reach;with bed alarm set Nurse Communication: Mobility status PT Visit Diagnosis: Unsteadiness on feet (R26.81);Muscle weakness (generalized) (M62.81)    Time: 1610-9604 PT Time Calculation (min) (ACUTE ONLY): 25 min   Charges:   PT Evaluation $PT Eval Low Complexity: 1 Low   PT General Charges $$ ACUTE PT VISIT: 1 Visit        Donna Bernard, PT, MPT   Ina Homes 10/24/2022, 9:35 AM

## 2022-10-24 NOTE — Progress Notes (Addendum)
PROGRESS NOTE    Susan [name]  EXB:[id number] DOB: Oct 06, 1940 DOA: 10/23/2022 PCP: Raymon Mutton., FNP   Brief Narrative: 82 year old with past medical history significant for chronic diastolic heart failure, hyperlipidemia, paroxysmal A-fib on Eliquis, anemia of chronic kidney disease, CKD 3B associated baseline creatinine 1.4-1.7 presents with generalized weakness, found to have AKI on CKD 3B.  Patient also reports 2 ground-level mechanical fall. Found to have hypercalcemia.    Assessment & Plan:   Principal Problem:   Acute renal failure superimposed on stage 3b chronic kidney disease (HCC) Active Problems:   Hyperlipidemia   Anemia of chronic disease   Fall at home, initial encounter   Generalized weakness   Troponin level elevated   Hypercalcemia   Sterile pyuria   Chronic diastolic CHF (congestive heart failure) (HCC)   Paroxysmal atrial fibrillation (HCC)   1-AKI on CKD 3B -Creatinine baseline 1.4--- 1.7 -Present with creatinine 2.2 -Continue to hold losartan and Lasix -Strict I's and O's -IV fluids Improved.  Elevation CK. Continue with IV fluids.    2-Generalized weakness -CT head no evidence of acute intracranial process -In the setting of AKI and dehydration, hypercalcemia -PT, OT.  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  BL LE weakness. Mild.   Hypokalemia; replete orally.   Mild Hypercalcemia: -Hold calcium supplementation -on IV fluids.   Ground-level mechanical fall PT OT eval  Ankle sprain: X ray:  Brace ordered.   Elevated troponin 30 range.  Suspect related to AKI EKG no acute changes Chest pain-free  Pyuria: Follow urine culture.   Chronic diastolic heart failure Holding Lasix in the setting of AKI and dehydration  Hyperlipidemia: Continue with Pravachol/    Paroxysmal A-fib Continue with With Eliquis Hold Coreg in the setting of bradycardia    Bradycardia Hold coreg.   Anemia of chronic diseases:   Baseline Hb 10-12 Hb stable at 10.       Estimated body mass index is 28.53 kg/m as calculated from the following:   Height as of this encounter: 5\' 1"  (1.549 m).   Weight as of this encounter: 68.5 kg.   DVT prophylaxis: Eliquis  Code Status: Full code Family Communication:Care discussed with patient.  Disposition Plan:  Status is: Observation The patient remains OBS appropriate and will d/c before 2 midnights.    Consultants:  None  Procedures:  None  Antimicrobials:    Subjective: She report acute on chronic back pain, report left leg pain and ankle pain. She has left ankle swelling. Report BL LE weakness, feels weak.    Objective: Vitals:   10/23/22 2027 10/23/22 2100 10/23/22 2238 10/24/22 0456  BP: (!) 120/90 (!) 152/80 (!) 159/86 (!) 185/78  Pulse: 64 (!) 50 (!) 57 (!) 55  Resp: 16   16  Temp: 97.7 F (36.5 C)  98.5 F (36.9 C) 98.4 F (36.9 C)  TempSrc: Temporal  Oral Oral  SpO2: 95% 97% 100% 99%  Weight:      Height:        Intake/Output Summary (Last 24 hours) at 10/24/2022 0800 Last data filed at 10/24/2022 0600 Gross per 24 hour  Intake 1745.35 ml  Output 300 ml  Net 1445.35 ml   Filed Weights   10/23/22 1339  Weight: 68.5 kg    Examination:  General exam: Appears calm and comfortable  Respiratory system: Clear to auscultation. Respiratory effort normal. Cardiovascular system: S1 & S2 heard, RRR. No JVD, murmurs, rubs, gallops or clicks. No pedal  edema. Gastrointestinal system: Abdomen is nondistended, soft and nontender. No organomegaly or masses felt. Normal bowel sounds heard. Central nervous system: Alert and oriented. No focal neurological deficits. Extremities: Able to raise both leg but weak. Left ankle swelling.     Data Reviewed: I have personally reviewed following labs and imaging studies  CBC: Recent Labs  Lab 10/23/22 1348 10/24/22 0444  WBC 3.8* 3.4*  NEUTROABS  --  2.1  HGB 11.5* 10.4*  HCT 34.6* 30.8*   MCV 93.8 91.7  PLT 201 187   Basic Metabolic Panel: Recent Labs  Lab 10/23/22 1348 10/24/22 0444  NA 139 142  K 4.3 3.1*  CL 107 108  CO2 24 23  GLUCOSE 124* 100*  BUN 40* 28*  CREATININE 2.27* 1.71*  CALCIUM 10.7* 10.6*  MG  --  1.8  PHOS  --  2.8   GFR: Estimated Creatinine Clearance: 21.7 mL/min (A) (by C-G formula based on SCr of 1.71 mg/dL (H)). Liver Function Tests: Recent Labs  Lab 10/24/22 0444  AST 43*  ALT 23  ALKPHOS 47  BILITOT 0.5  PROT 5.9*  ALBUMIN 3.3*   No results for input(s): "LIPASE", "AMYLASE" in the last 168 hours. No results for input(s): "AMMONIA" in the last 168 hours. Coagulation Profile: No results for input(s): "INR", "PROTIME" in the last 168 hours. Cardiac Enzymes: Recent Labs  Lab 10/24/22 0444  CKTOTAL 599*   BNP (last 3 results) No results for input(s): "PROBNP" in the last 8760 hours. HbA1C: No results for input(s): "HGBA1C" in the last 72 hours. CBG: Recent Labs  Lab 10/23/22 1334  GLUCAP 108*   Lipid Profile: No results for input(s): "CHOL", "HDL", "LDLCALC", "TRIG", "CHOLHDL", "LDLDIRECT" in the last 72 hours. Thyroid Function Tests: Recent Labs    10/24/22 0444  TSH 0.735   Anemia Panel: No results for input(s): "VITAMINB12", "FOLATE", "FERRITIN", "TIBC", "IRON", "RETICCTPCT" in the last 72 hours. Sepsis Labs: No results for input(s): "PROCALCITON", "LATICACIDVEN" in the last 168 hours.  No results found for this or any previous visit (from the past 240 hour(s)).       Radiology Studies: DG Chest Portable 1 View  Result Date: 10/23/2022 CLINICAL DATA:  Weakness EXAM: PORTABLE CHEST 1 VIEW COMPARISON:  01/11/2022 FINDINGS: Stable cardiomegaly. Aortic atherosclerotic calcification. Pulmonary vascular congestion. No focal consolidation, pleural effusion, or pneumothorax. No displaced rib fractures. IMPRESSION: Cardiomegaly and pulmonary vascular congestion. Electronically Signed   By: Minerva Fester M.D.    On: 10/23/2022 19:32   CT Lumbar Spine Wo Contrast  Result Date: 10/23/2022 CLINICAL DATA:  Low back pain. Multiple recent falls. Increasing weakness. EXAM: CT LUMBAR SPINE WITHOUT CONTRAST TECHNIQUE: Multidetector CT imaging of the lumbar spine was performed without intravenous contrast administration. Multiplanar CT image reconstructions were also generated. RADIATION DOSE REDUCTION: This exam was performed according to the departmental dose-optimization program which includes automated exposure control, adjustment of the mA and/or kV according to patient size and/or use of iterative reconstruction technique. COMPARISON:  Lumbar spine radiographs 05/15/2022. CT abdomen and pelvis 08/06/2021. Lumbar spine MRI 06/07/2014. FINDINGS: Segmentation: 5 lumbar type vertebrae. Alignment: Exaggerated lumbar lordosis. Mild lumbar levoscoliosis. Grade 1 anterolisthesis of L3 on L4 and L4 on L5, unchanged from the 2023 CT. Vertebrae: No acute fracture or suspicious osseous lesion. Multiple Schmorl's nodes, including a moderately large Schmorl's node involving the L2 superior endplate which is new from 2023. Paraspinal and other soft tissues: Aortic atherosclerosis. Uterine fibroids. 8 mm left renal calculus. Bilateral renal cysts with no  follow-up imaging required. Disc levels: Advanced multilevel disc and facet degeneration with vacuum disc phenomenon at every level except L2-3. Anterolisthesis with bulging uncovered disc and posterior element hypertrophy result in at least moderate spinal stenosis at L3-4 and severe spinal stenosis at L4-5, stable to mildly progressed from 2016. IMPRESSION: 1. No acute osseous abnormality. 2. 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 3. Nonobstructing left nephrolithiasis. 4.  Aortic Atherosclerosis (ICD10-I70.0). Electronically Signed   By: Sebastian Ache M.D.   On: 10/23/2022 17:16   CT  Head Wo Contrast  Result Date: 10/23/2022 CLINICAL DATA:  Neuro deficit, acute, stroke suspected. Weakness, fall, back pain; Neck trauma (Age >= 65y). Worsening slurred speech over 1 week. Increasing weakness with multiple recent falls. EXAM: CT HEAD WITHOUT CONTRAST CT CERVICAL SPINE WITHOUT CONTRAST TECHNIQUE: Multidetector CT imaging of the head and cervical spine was performed following the standard protocol without intravenous contrast. Multiplanar CT image reconstructions of the cervical spine were also generated. RADIATION DOSE REDUCTION: This exam was performed according to the departmental dose-optimization program which includes automated exposure control, adjustment of the mA and/or kV according to patient size and/or use of iterative reconstruction technique. COMPARISON:  CT head and cervical spine 02/12/2018. MRI head 02/12/2018. MRI cervical spine 08/29/2020. FINDINGS: CT HEAD FINDINGS Brain: There is no evidence of an acute infarct, intracranial hemorrhage, mass, midline shift, or extra-axial fluid collection. Periventricular white matter hypodensities are similar to the prior CT and are nonspecific but compatible with mild chronic small vessel ischemic disease. 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 There is moderately advanced cerebral atrophy. Vascular: Calcified atherosclerosis at the skull base. No hyperdense vessel. Skull: No acute fracture or suspicious osseous lesion. Sinuses/Orbits: Visualized paranasal sinuses and mastoid air cells are clear. Bilateral cataract extraction. Other: None. CT CERVICAL SPINE FINDINGS Alignment: Mild left convex curvature of the cervical spine. Chronic grade 1 anterolisthesis of C2 on C3. Widening of the atlantodental interval to 5 mm, new from the 2020 CT but unchanged from the 2022 MRI and potentially secondary to C1-2 arthropathy or remote injury. Skull base and vertebrae: No acute fracture or suspicious osseous lesion. 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 Soft tissues and spinal canal: No prevertebral fluid or swelling. No visible canal hematoma. Disc levels: Widespread, advanced disc degeneration throughout the cervical spine. Interbody and bilateral facet ankylosis at C3-4. Mild-to-moderate multilevel spinal stenosis and moderate to severe multilevel neural foraminal stenosis as previously shown. Upper chest: Clear lung apices. Other: 2.3 cm hypodense nodule in the left thyroid lobe which has substantially enlarged from 2020 where it measured 1.2 cm. IMPRESSION: 1. No evidence of acute intracranial abnormality or acute cervical spine fracture. 2. Mild chronic small vessel ischemic disease and moderately advanced cerebral atrophy. 3. Advanced cervical disc degeneration. 4. 2.3 cm left thyroid nodule, increased in size from 2020. Consider thyroid ultrasound for further evaluation if clinically warranted, taking into account the patient's age and comorbidities. Electronically Signed   By: Sebastian Ache M.D.   On: 10/23/2022 17:08   CT Cervical Spine Wo Contrast  Result Date: 10/23/2022 CLINICAL DATA:  Neuro deficit, acute, stroke suspected. Weakness, fall, back pain; Neck trauma (Age >= 65y). Worsening slurred speech over 1 week. Increasing weakness with multiple recent falls. EXAM: CT HEAD WITHOUT CONTRAST CT CERVICAL SPINE WITHOUT CONTRAST TECHNIQUE: Multidetector CT imaging of the head and cervical spine was  performed following the standard protocol without intravenous contrast. Multiplanar CT image reconstructions of the cervical spine were also generated. RADIATION DOSE REDUCTION: This exam was performed according to the departmental dose-optimization program which includes automated exposure control, adjustment of the mA and/or kV according to patient size and/or use of iterative reconstruction technique. COMPARISON:  CT head and cervical spine 02/12/2018. MRI head 02/12/2018. MRI cervical spine 08/29/2020.  FINDINGS: CT HEAD FINDINGS Brain: There is no evidence of an acute infarct, intracranial hemorrhage, mass, midline shift, or extra-axial fluid collection. Periventricular white matter hypodensities are similar to the prior CT and are nonspecific but compatible with mild chronic small vessel ischemic disease. 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 There is moderately advanced cerebral atrophy. Vascular: Calcified atherosclerosis at the skull base. No hyperdense vessel. Skull: No acute fracture or suspicious osseous lesion. Sinuses/Orbits: Visualized paranasal sinuses and mastoid air cells are clear. Bilateral cataract extraction. Other: None. CT CERVICAL SPINE FINDINGS Alignment: Mild left convex curvature of the cervical spine. Chronic grade 1 anterolisthesis of C2 on C3. Widening of the atlantodental interval to 5 mm, new from the 2020 CT but unchanged from the 2022 MRI and potentially secondary to C1-2 arthropathy or remote injury. Skull base and vertebrae: No acute fracture or suspicious osseous lesion. 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 Soft tissues and spinal canal: No prevertebral fluid or swelling. No visible canal hematoma. Disc levels: Widespread, advanced disc degeneration throughout the cervical spine. Interbody and bilateral facet ankylosis at C3-4. Mild-to-moderate multilevel spinal stenosis and moderate to severe multilevel neural foraminal stenosis as previously shown. Upper chest: Clear lung apices. Other: 2.3 cm hypodense nodule in the left thyroid lobe which has substantially enlarged from 2020 where it measured 1.2 cm. IMPRESSION: 1. No evidence of acute intracranial abnormality or acute cervical spine fracture. 2. Mild chronic small vessel ischemic disease and moderately advanced cerebral atrophy. 3. Advanced cervical disc degeneration. 4. 2.3 cm left thyroid nodule, increased in size from 2020. Consider thyroid ultrasound for further evaluation if  clinically warranted, taking into account the patient's age and comorbidities. Electronically Signed   By: Sebastian Ache M.D.   On: 10/23/2022 17:08        Scheduled Meds:  apixaban  2.5 mg Oral BID   busPIRone  15 mg Oral BID   donepezil  10 mg Oral QHS   ferrous sulfate  325 mg Oral BID   hydrALAZINE  25 mg Oral TID   pravastatin  40 mg Oral Daily   sertraline  100 mg Oral Daily   Continuous Infusions:   LOS: 0 days    Time spent: 35 minutes    Larnell Granlund A Robert Sunga, MD [facility]   If 7PM-7AM, please contact night-coverage [email or web address]  10/24/2022, 8:00 AM

## 2022-10-24 NOTE — Plan of Care (Signed)
  Problem: Education: Goal: Knowledge of General Education information will improve Description Including pain rating scale, medication(s)/side effects and non-pharmacologic comfort measures Outcome: Progressing   

## 2022-10-24 NOTE — Progress Notes (Signed)
Orthopedic Tech Progress Note Patient Details:  Susan [name] October 18, 1940 [id number]  Ortho Devices Type of Ortho Device: ASO Ortho Device/Splint Location: LLE Ortho Device/Splint Interventions: Ordered, Application, Adjustment   Post Interventions Patient Tolerated: Well  Susan [name] 10/24/2022, 3:19 PM

## 2022-10-24 NOTE — Evaluation (Signed)
Occupational Therapy Evaluation Patient Details Name: Susan [name] MRN: [id number] DOB: [birth year] Today's Date: 10/24/2022   History of Present Illness Patient is a 82 year old female with generalized weakness,  AKI superimposed on CKD 3B. Two recent falls. History of chronic diastolic heart failure, hyperlipidemia, paroxysmal atrial fibrillation, anemia, CKD   Clinical Impression   Pt s/p above diagnosis. Pt c/o minimal pain at rest, 7/10 L ankle pain and B knee pain with activities. Pt lives alone, PLOF independent with ADLs and meal prep, aide cleans and does laundry 1X/wk. Pt currently requires mod-max A for all ADLs, limiting ability to complete bed mobility, L ankle pain prevents standing at this time, likely not able to complete lateral transfer to chair due to gross weakness. Pt demonstrates decreased awareness of deficits, increased time for problem solving but overall WFLs. Pt would benefit from postacute therapy <3hrs/day to return to functional level, will be seen acutely to maximize progress as able.       If plan is discharge home, recommend the following: Two people to help with walking and/or transfers;A lot of help with bathing/dressing/bathroom;Assistance with cooking/housework;Assist for transportation;Help with stairs or ramp for entrance    Functional Status Assessment  Patient has had a recent decline in their functional status and demonstrates the ability to make significant improvements in function in a reasonable and predictable amount of time.  Equipment Recommendations  Other (comment) (defer)    Recommendations for Other Services       Precautions / Restrictions Precautions Precautions: Fall Restrictions Weight Bearing Restrictions: No      Mobility Bed Mobility Overal bed mobility: Needs Assistance Bed Mobility: Supine to Sit, Sit to Supine     Supine to sit: Max assist, HOB elevated, Used rails Sit to supine: Max assist, Used rails   General bed  mobility comments: significant assistance for BLEs, scooting to edge, and power to sit up    Transfers Overall transfer level: Needs assistance                 General transfer comment: did not attempt due to L foot pain, MD wants to xray L foot      Balance Overall balance assessment: Needs assistance Sitting-balance support: Single extremity supported, Feet supported Sitting balance-Leahy Scale: Poor Sitting balance - Comments: Pt leans on RUE for support, not able to lean L/R or forward well, able to maintain trunk support at EOB. Postural control: Right lateral lean   Standing balance-Leahy Scale: Zero Standing balance comment: L foot pain, to be xrayed soon per MD                           ADL either performed or assessed with clinical judgement   ADL Overall ADL's : Needs assistance/impaired Eating/Feeding: Set up;Sitting   Grooming: Set up;Sitting   Upper Body Bathing: Moderate assistance;Sitting   Lower Body Bathing: Maximal assistance;Bed level   Upper Body Dressing : Moderate assistance;Sitting   Lower Body Dressing: Maximal assistance;Bed level   Toilet Transfer: Total assistance   Toileting- Clothing Manipulation and Hygiene: Maximal assistance;Bed level         General ADL Comments: Pt requires signifcant assistance with LB ADLs, decreased L hand FM skills limit BL tasks, increased time for UB ADLs.     Vision Baseline Vision/History: 0 No visual deficits Ability to See in Adequate Light: 0 Adequate Patient Visual Report: No change from baseline  Perception         Praxis         Pertinent Vitals/Pain Pain Assessment Pain Assessment: 0-10 Pain Score: 7  Pain Location: L ankle Pain Descriptors / Indicators: Discomfort Pain Intervention(s): Monitored during session     Extremity/Trunk Assessment Upper Extremity Assessment Upper Extremity Assessment: RUE deficits/detail;LUE deficits/detail RUE Deficits / Details:  numbness comes and goes, has trouble with FM skills RUE Sensation: history of peripheral neuropathy RUE Coordination: decreased fine motor LUE Deficits / Details: Pt has significantly decreased FM skills, hx of neuropathy LUE Sensation: history of peripheral neuropathy LUE Coordination: decreased fine motor   Lower Extremity Assessment Lower Extremity Assessment: Defer to PT evaluation;Generalized weakness;LLE deficits/detail LLE Deficits / Details: L ankle pain when standing, to the touch at 5th metatarsal       Communication Communication Communication: No apparent difficulties   Cognition Arousal: Alert Behavior During Therapy: WFL for tasks assessed/performed Overall Cognitive Status: No family/caregiver present to determine baseline cognitive functioning                                 General Comments: overall WFLs, not aware of extent of deficits, states memory isn't as good as it used to be.     General Comments       Exercises     Shoulder Instructions      Home Living Family/patient expects to be discharged to:: Private residence Living Arrangements: Alone Available Help at Discharge: Family;Available PRN/intermittently Type of Home: Apartment Home Access: Elevator     Home Layout: One level               Home Equipment: Rollator (4 wheels);Cane - single point;Shower seat   Additional Comments: Pt lives alone, has daughter who can assist after work.      Prior Functioning/Environment Prior Level of Function : Independent/Modified Independent;History of Falls (last six months)             Mobility Comments: 2 recent falls at home where she had to call EMS to assist getting up. Usually Mod I with ambulation using 4 wheeled walker. does not drive- gets groceries delivered ADLs Comments: independent per patient report, has aide who cleans once a week.        OT Problem List: Decreased strength;Decreased range of motion;Decreased  activity tolerance;Impaired balance (sitting and/or standing);Decreased safety awareness;Decreased knowledge of use of DME or AE;Impaired sensation;Impaired UE functional use;Pain      OT Treatment/Interventions: Self-care/ADL training;Therapeutic exercise;Neuromuscular education;DME and/or AE instruction;Energy conservation;Patient/family education;Balance training;Therapeutic activities;Manual therapy    OT Goals(Current goals can be found in the care plan section) Acute Rehab OT Goals Patient Stated Goal: to improve strength OT Goal Formulation: With patient Time For Goal Achievement: 11/07/22 Potential to Achieve Goals: Fair  OT Frequency: Min 1X/week    Co-evaluation              AM-PAC OT "6 Clicks" Daily Activity     Outcome Measure Help from another person eating meals?: A Little Help from another person taking care of personal grooming?: A Little Help from another person toileting, which includes using toliet, bedpan, or urinal?: A Lot Help from another person bathing (including washing, rinsing, drying)?: A Lot Help from another person to put on and taking off regular upper body clothing?: A Lot Help from another person to put on and taking off regular lower body clothing?: A Lot 6 Click Score:  14   End of Session Nurse Communication: Mobility status  Activity Tolerance: Patient limited by pain Patient left: in bed;with call bell/phone within reach  OT Visit Diagnosis: Unsteadiness on feet (R26.81);Other abnormalities of gait and mobility (R26.89);Repeated falls (R29.6);Muscle weakness (generalized) (M62.81);Pain Pain - Right/Left: Left Pain - part of body: Ankle and joints of foot                Time: 1011-1045 OT Time Calculation (min): 34 min Charges:  OT General Charges $OT Visit: 1 Visit OT Evaluation $OT Eval Moderate Complexity: 1 Mod OT Treatments $Self Care/Home Management : 8-22 mins  [place], OTR/L   Alexis Goodell 10/24/2022, 10:55 AM

## 2022-10-25 DIAGNOSIS — N179 Acute kidney failure, unspecified: Secondary | ICD-10-CM | POA: Diagnosis not present

## 2022-10-25 LAB — BASIC METABOLIC PANEL
Anion gap: 8 (ref 5–15)
BUN: 20 mg/dL (ref 8–23)
CO2: 23 mmol/L (ref 22–32)
Calcium: 9.7 mg/dL (ref 8.9–10.3)
Chloride: 110 mmol/L (ref 98–111)
Creatinine, Ser: 1.58 mg/dL — ABNORMAL HIGH (ref 0.44–1.00)
GFR, Estimated: 32 mL/min — ABNORMAL LOW (ref >60)
Glucose, Bld: 91 mg/dL (ref 70–99)
Potassium: 4.2 mmol/L (ref 3.5–5.1)
Sodium: 141 mmol/L (ref 135–145)

## 2022-10-25 LAB — URIC ACID: Uric Acid, Serum: 6.8 mg/dL (ref 2.5–7.1)

## 2022-10-25 LAB — CK: Total CK: 312 U/L — ABNORMAL HIGH (ref 38–234)

## 2022-10-25 NOTE — Progress Notes (Signed)
PROGRESS NOTE    Susan [name]  WJX:[id number] DOB: Jun 30, 1940 DOA: 10/23/2022 PCP: Raymon Mutton., FNP   Brief Narrative: 82 year old with past medical history significant for chronic diastolic heart failure, hyperlipidemia, paroxysmal A-fib on Eliquis, anemia of chronic kidney disease, CKD 3B associated baseline creatinine 1.4-1.7 presents with generalized weakness, found to have AKI on CKD 3B.  Patient also reports 2 ground-level mechanical fall. Found to have hypercalcemia.    Assessment & Plan:   Principal Problem:   Acute renal failure superimposed on stage 3b chronic kidney disease (HCC) Active Problems:   Hyperlipidemia   Anemia of chronic disease   Fall at home, initial encounter   Generalized weakness   Troponin level elevated   Hypercalcemia   Sterile pyuria   Chronic diastolic CHF (congestive heart failure) (HCC)   Paroxysmal atrial fibrillation (HCC)   AKI (acute kidney injury) (HCC)   1-AKI on CKD 3B -Creatinine baseline 1.4--- 1.7 -Present with creatinine 2.2 -Continue to hold losartan and Lasix -Strict I's and O's -Received IV fluids Improved. Cr down to 1.5. stop fluids encourage oral intake. Elevation of CK. Trending down 599---312   2-Generalized weakness -CT head no evidence of acute intracranial process -In the setting of AKI and dehydration, hypercalcemia -PT, OT. Recommend rehab.  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   Back pain, Moderate to severe spinal stenosis L 4-L 5 and moderate stenosis L 3 L 4  MRI: L4-L5 moderate to severe spinal canal stenosis with moderate right and mild left neural foraminal narrowing. Effacement of the lateral recesses at this level likely compresses the descending L5 nerve roots. L3-L4 moderate spinal canal stenosis and mild bilateral neural foraminal narrowing. Narrowing of the lateral recesses at this level could affect the descending L4 nerve roots. -she is able to move LE better, she report  improvement of back pain.  -Dr Franky Macho will see patient in consultation.     Feet pain; uric acid normal.    Hypokalemia; Replaced  Mild Hypercalcemia: -Hold calcium supplementation -resolved with fluids.   Ground-level mechanical fall PT OT eval. Needs rehab  Ankle sprain: X ray: Mildly increased generalized soft tissue swelling. No acute osseous findings or other significant changes compared with prior studies. Brace ordered.  Swelling improved.   Elevated troponin 30 range.  Suspect related to AKI EKG no acute changes Chest pain-free  Pyuria: Follow urine culture.   Chronic diastolic heart failure Holding Lasix in the setting of AKI and dehydration  Hyperlipidemia: Continue with Pravachol/    Paroxysmal A-fib Continue with With Eliquis Hold Coreg in the setting of bradycardia    Bradycardia Hold coreg.  Asymptomatic. Improved.   Anemia of chronic diseases:  Baseline Hb 10-12 Hb stable at 10.       Estimated body mass index is 28.53 kg/m as calculated from the following:   Height as of this encounter: 5\' 1"  (1.549 m).   Weight as of this encounter: 68.5 kg.   DVT prophylaxis: Eliquis  Code Status: Full code Family Communication:Care discussed with patient.  Disposition Plan:  Status is: Observation The patient remains OBS appropriate and will d/c before 2 midnights.    Consultants:  None  Procedures:  None  Antimicrobials:    Subjective: She is feeling better, report improvement of left ankle pain and swelling. She has feet pain.   Report some improvement of back pain. She is able to move better, BL extremities.  Objective: Vitals:   10/24/22 2040 10/25/22 0500  10/25/22 0541 10/25/22 0925  BP: (!) 156/66  (!) 146/94 (!) 159/86  Pulse: 74  70 65  Resp: 18  18 16   Temp: 98.3 F (36.8 C)  98.4 F (36.9 C) 98.1 F (36.7 C)  TempSrc:    Oral  SpO2: 98%  100% 100%  Weight:  68.5 kg    Height:        Intake/Output Summary  (Last 24 hours) at 10/25/2022 1441 Last data filed at 10/25/2022 0706 Gross per 24 hour  Intake 667.62 ml  Output 1300 ml  Net -632.38 ml   Filed Weights   10/23/22 1339 10/25/22 0500  Weight: 68.5 kg 68.5 kg    Examination:  General exam: NAD Respiratory system: CTA Cardiovascular system: S 1. S 2 RRR Gastrointestinal system: BS present, soft,nt Central nervous system: Non focal.  Extremities: Left ankle with less swelling.     Data Reviewed: I have personally reviewed following labs and imaging studies  CBC: Recent Labs  Lab 10/23/22 1348 10/24/22 0444  WBC 3.8* 3.4*  NEUTROABS  --  2.1  HGB 11.5* 10.4*  HCT 34.6* 30.8*  MCV 93.8 91.7  PLT 201 187   Basic Metabolic Panel: Recent Labs  Lab 10/23/22 1348 10/24/22 0444 10/25/22 1045  NA 139 142 141  K 4.3 3.1* 4.2  CL 107 108 110  CO2 24 23 23   GLUCOSE 124* 100* 91  BUN 40* 28* 20  CREATININE 2.27* 1.71* 1.58*  CALCIUM 10.7* 10.6* 9.7  MG  --  1.8  --   PHOS  --  2.8  --    GFR: Estimated Creatinine Clearance: 23.5 mL/min (A) (by C-G formula based on SCr of 1.58 mg/dL (H)). Liver Function Tests: Recent Labs  Lab 10/24/22 0444  AST 43*  ALT 23  ALKPHOS 47  BILITOT 0.5  PROT 5.9*  ALBUMIN 3.3*   No results for input(s): "LIPASE", "AMYLASE" in the last 168 hours. No results for input(s): "AMMONIA" in the last 168 hours. Coagulation Profile: No results for input(s): "INR", "PROTIME" in the last 168 hours. Cardiac Enzymes: Recent Labs  Lab 10/24/22 0444 10/25/22 1045  CKTOTAL 599* 312*   BNP (last 3 results) No results for input(s): "PROBNP" in the last 8760 hours. HbA1C: No results for input(s): "HGBA1C" in the last 72 hours. CBG: Recent Labs  Lab 10/23/22 1334  GLUCAP 108*   Lipid Profile: No results for input(s): "CHOL", "HDL", "LDLCALC", "TRIG", "CHOLHDL", "LDLDIRECT" in the last 72 hours. Thyroid Function Tests: Recent Labs    10/24/22 0444  TSH 0.735   Anemia  Panel: Recent Labs    10/24/22 0817  VITAMINB12 837   Sepsis Labs: No results for input(s): "PROCALCITON", "LATICACIDVEN" in the last 168 hours.  Recent Results (from the past 240 hour(s))  Urine Culture     Status: Abnormal   Collection Time: 10/23/22  2:53 PM   Specimen: Urine, Clean Catch  Result Value Ref Range Status   Specimen Description   Final    URINE, CLEAN CATCH Performed at [facility], [address] [place], [place] [address]    Special Requests   Final    NONE Performed at [facility], [address] [place], [place] [address]    Culture (A)  Final    10,000 COLONIES/mL MULTIPLE SPECIES PRESENT, SUGGEST RECOLLECTION   Report Status 10/24/2022 FINAL  Final         Radiology Studies: MR LUMBAR SPINE WO CONTRAST  Result Date: 10/25/2022 CLINICAL  DATA:  Generalized weakness, multiple falls EXAM: MRI LUMBAR SPINE WITHOUT CONTRAST TECHNIQUE: Multiplanar, multisequence MR imaging of the lumbar spine was performed. No intravenous contrast was administered. COMPARISON:  06/07/2014 FINDINGS: Segmentation:  5 lumbar type vertebral bodies. Alignment: Levocurvature. Trace anterolisthesis of L3 on L4. 5 mm anterolisthesis of L4 on L5. Vertebrae: No acute fracture, evidence of discitis, or suspicious osseous lesion. Conus medullaris and cauda equina: Conus extends to the L2 level. Conus and cauda equina appear normal. Paraspinal and other soft tissues: Multiple renal cysts, for which no follow-up is currently indicated. Disc levels: T11-T12: Mild disc bulge. Mild facet arthropathy. No spinal canal stenosis or neural foraminal narrowing. T12-L1: Minimal disc bulge. Mild facet arthropathy. No spinal canal stenosis or neural foraminal narrowing. L1-L2: No significant disc bulge. Mild facet arthropathy. No spinal canal stenosis or neural foraminal narrowing. L2-L3: Minimal disc bulge. Mild facet arthropathy. No spinal canal stenosis. Mild  left neural foraminal narrowing. L3-L4: Mild-to-moderate disc bulge with superimposed right foraminal and extreme lateral protrusion. Moderate to severe facet arthropathy. Ligamentum flavum hypertrophy. Narrowing of the lateral recesses. Moderate spinal canal stenosis, which has progressed since 2016. Mild bilateral neural foraminal narrowing. L4-L5: Grade 1 anterolisthesis with disc unroofing and right eccentric disc bulge. Moderate facet arthropathy. Ligamentum flavum hypertrophy. Effacement of the lateral recesses. Moderate to severe spinal canal stenosis, unchanged. Moderate right and mild left neural foraminal narrowing. L5-S1: Mild disc bulge with central protrusion with annular fissure. No spinal canal stenosis or neural foraminal narrowing. IMPRESSION: 1. L4-L5 moderate to severe spinal canal stenosis with moderate right and mild left neural foraminal narrowing. Effacement of the lateral recesses at this level likely compresses the descending L5 nerve roots. 2. L3-L4 moderate spinal canal stenosis and mild bilateral neural foraminal narrowing. Narrowing of the lateral recesses at this level could affect the descending L4 nerve roots. 3. L2-L3 mild left neural foraminal narrowing. Electronically Signed   By: Wiliam Ke M.D.   On: 10/25/2022 02:40   DG Ankle Complete Left  Result Date: 10/24/2022 CLINICAL DATA:  Left ankle pain and swelling.  No recent injury. EXAM: LEFT ANKLE COMPLETE - 3+ VIEW COMPARISON:  Radiographs 05/15/2022 and 04/11/2022. FINDINGS: The bones are mildly demineralized. No evidence of acute fracture or dislocation. Mild midfoot degenerative changes are stable. Generalized soft tissue swelling appears mildly increased. Diffuse soft tissue calcifications within the lower leg are chronic. IMPRESSION: Mildly increased generalized soft tissue swelling. No acute osseous findings or other significant changes compared with prior studies. Electronically Signed   By: Carey Bullocks M.D.    On: 10/24/2022 14:40   DG Chest Portable 1 View  Result Date: 10/23/2022 CLINICAL DATA:  Weakness EXAM: PORTABLE CHEST 1 VIEW COMPARISON:  01/11/2022 FINDINGS: Stable cardiomegaly. Aortic atherosclerotic calcification. Pulmonary vascular congestion. No focal consolidation, pleural effusion, or pneumothorax. No displaced rib fractures. IMPRESSION: Cardiomegaly and pulmonary vascular congestion. Electronically Signed   By: Minerva Fester M.D.   On: 10/23/2022 19:32   CT Lumbar Spine Wo Contrast  Result Date: 10/23/2022 CLINICAL DATA:  Low back pain. Multiple recent falls. Increasing weakness. EXAM: CT LUMBAR SPINE WITHOUT CONTRAST TECHNIQUE: Multidetector CT imaging of the lumbar spine was performed without intravenous contrast administration. Multiplanar CT image reconstructions were also generated. RADIATION DOSE REDUCTION: This exam was performed according to the departmental dose-optimization program which includes automated exposure control, adjustment of the mA and/or kV according to patient size and/or use of iterative reconstruction technique. COMPARISON:  Lumbar spine radiographs 05/15/2022. CT abdomen and pelvis 08/06/2021.  Lumbar spine MRI 06/07/2014. FINDINGS: Segmentation: 5 lumbar type vertebrae. Alignment: Exaggerated lumbar lordosis. Mild lumbar levoscoliosis. Grade 1 anterolisthesis of L3 on L4 and L4 on L5, unchanged from the 2023 CT. Vertebrae: No acute fracture or suspicious osseous lesion. Multiple Schmorl's nodes, including a moderately large Schmorl's node involving the L2 superior endplate which is new from 2023. Paraspinal and other soft tissues: Aortic atherosclerosis. Uterine fibroids. 8 mm left renal calculus. Bilateral renal cysts with no follow-up imaging required. Disc levels: Advanced multilevel disc and facet degeneration with vacuum disc phenomenon at every level except L2-3. Anterolisthesis with bulging uncovered disc and posterior element hypertrophy result in at least  moderate spinal stenosis at L3-4 and severe spinal stenosis at L4-5, stable to mildly progressed from 2016. IMPRESSION: 1. No acute osseous abnormality. 2. 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 3. Nonobstructing left nephrolithiasis. 4.  Aortic Atherosclerosis (ICD10-I70.0). Electronically Signed   By: Sebastian Ache M.D.   On: 10/23/2022 17:16   CT Head Wo Contrast  Result Date: 10/23/2022 CLINICAL DATA:  Neuro deficit, acute, stroke suspected. Weakness, fall, back pain; Neck trauma (Age >= 65y). Worsening slurred speech over 1 week. Increasing weakness with multiple recent falls. EXAM: CT HEAD WITHOUT CONTRAST CT CERVICAL SPINE WITHOUT CONTRAST TECHNIQUE: Multidetector CT imaging of the head and cervical spine was performed following the standard protocol without intravenous contrast. Multiplanar CT image reconstructions of the cervical spine were also generated. RADIATION DOSE REDUCTION: This exam was performed according to the departmental dose-optimization program which includes automated exposure control, adjustment of the mA and/or kV according to patient size and/or use of iterative reconstruction technique. COMPARISON:  CT head and cervical spine 02/12/2018. MRI head 02/12/2018. MRI cervical spine 08/29/2020. FINDINGS: CT HEAD FINDINGS Brain: There is no evidence of an acute infarct, intracranial hemorrhage, mass, midline shift, or extra-axial fluid collection. Periventricular white matter hypodensities are similar to the prior CT and are nonspecific but compatible with mild chronic small vessel ischemic disease. 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 There is moderately advanced cerebral atrophy. Vascular: Calcified atherosclerosis at the skull base. No hyperdense vessel. Skull: No acute fracture or suspicious osseous lesion. Sinuses/Orbits: Visualized paranasal sinuses and  mastoid air cells are clear. Bilateral cataract extraction. Other: None. CT CERVICAL SPINE FINDINGS Alignment: Mild left convex curvature of the cervical spine. Chronic grade 1 anterolisthesis of C2 on C3. Widening of the atlantodental interval to 5 mm, new from the 2020 CT but unchanged from the 2022 MRI and potentially secondary to C1-2 arthropathy or remote injury. Skull base and vertebrae: No acute fracture or suspicious osseous lesion. 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 Soft tissues and spinal canal: No prevertebral fluid or swelling. No visible canal hematoma. Disc levels: Widespread, advanced disc degeneration throughout the cervical spine. Interbody and bilateral facet ankylosis at C3-4. Mild-to-moderate multilevel spinal stenosis and moderate to severe multilevel neural foraminal stenosis as previously shown. Upper chest: Clear lung apices. Other: 2.3 cm hypodense nodule in the left thyroid lobe which has substantially enlarged from 2020 where it measured 1.2 cm. IMPRESSION: 1. No evidence of acute intracranial abnormality or acute cervical spine fracture. 2. Mild chronic small vessel ischemic disease and moderately advanced cerebral atrophy. 3. Advanced cervical disc degeneration. 4. 2.3 cm left thyroid nodule, increased in size from 2020. Consider thyroid ultrasound for further evaluation if clinically warranted, taking into account the  patient's age and comorbidities. Electronically Signed   By: Sebastian Ache M.D.   On: 10/23/2022 17:08   CT Cervical Spine Wo Contrast  Result Date: 10/23/2022 CLINICAL DATA:  Neuro deficit, acute, stroke suspected. Weakness, fall, back pain; Neck trauma (Age >= 65y). Worsening slurred speech over 1 week. Increasing weakness with multiple recent falls. EXAM: CT HEAD WITHOUT CONTRAST CT CERVICAL SPINE WITHOUT CONTRAST TECHNIQUE: Multidetector CT imaging of the head and cervical spine was performed following the standard protocol without  intravenous contrast. Multiplanar CT image reconstructions of the cervical spine were also generated. RADIATION DOSE REDUCTION: This exam was performed according to the departmental dose-optimization program which includes automated exposure control, adjustment of the mA and/or kV according to patient size and/or use of iterative reconstruction technique. COMPARISON:  CT head and cervical spine 02/12/2018. MRI head 02/12/2018. MRI cervical spine 08/29/2020. FINDINGS: CT HEAD FINDINGS Brain: There is no evidence of an acute infarct, intracranial hemorrhage, mass, midline shift, or extra-axial fluid collection. Periventricular white matter hypodensities are similar to the prior CT and are nonspecific but compatible with mild chronic small vessel ischemic disease. 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 There is moderately advanced cerebral atrophy. Vascular: Calcified atherosclerosis at the skull base. No hyperdense vessel. Skull: No acute fracture or suspicious osseous lesion. Sinuses/Orbits: Visualized paranasal sinuses and mastoid air cells are clear. Bilateral cataract extraction. Other: None. CT CERVICAL SPINE FINDINGS Alignment: Mild left convex curvature of the cervical spine. Chronic grade 1 anterolisthesis of C2 on C3. Widening of the atlantodental interval to 5 mm, new from the 2020 CT but unchanged from the 2022 MRI and potentially secondary to C1-2 arthropathy or remote injury. Skull base and vertebrae: No acute fracture or suspicious osseous lesion. 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 Soft tissues and spinal canal: No prevertebral fluid or swelling. No visible canal hematoma. Disc levels: Widespread, advanced disc degeneration throughout the cervical spine. Interbody and bilateral facet ankylosis at C3-4. Mild-to-moderate multilevel spinal stenosis and moderate to severe multilevel neural foraminal stenosis as previously shown. Upper chest: Clear lung apices.  Other: 2.3 cm hypodense nodule in the left thyroid lobe which has substantially enlarged from 2020 where it measured 1.2 cm. IMPRESSION: 1. No evidence of acute intracranial abnormality or acute cervical spine fracture. 2. Mild chronic small vessel ischemic disease and moderately advanced cerebral atrophy. 3. Advanced cervical disc degeneration. 4. 2.3 cm left thyroid nodule, increased in size from 2020. Consider thyroid ultrasound for further evaluation if clinically warranted, taking into account the patient's age and comorbidities. Electronically Signed   By: Sebastian Ache M.D.   On: 10/23/2022 17:08        Scheduled Meds:  apixaban  2.5 mg Oral BID   busPIRone  15 mg Oral BID   donepezil  10 mg Oral QHS   ferrous sulfate  325 mg Oral BID   hydrALAZINE  25 mg Oral TID   mometasone-formoterol  2 puff Inhalation BID   pravastatin  40 mg Oral Daily   sertraline  100 mg Oral Daily   Continuous Infusions:   LOS: 1 day    Time spent: 35 minutes    Latrisa Hellums A Nicolas Banh, MD [facility]   If 7PM-7AM, please contact night-coverage [email or web address]  10/25/2022, 2:41 PM

## 2022-10-25 NOTE — Plan of Care (Signed)
[facility] Tonsillectomy/Adenoidectomy, Postoperative PEDS care plan entered in error.

## 2022-10-25 NOTE — TOC Initial Note (Signed)
Transition of Care [facility]) - Initial/Assessment Note    Patient Details  Name: Susan [name] MRN: [id number] Date of Birth: 1940/11/12  [facility]) CM/SW Contact:    Erin Sons, LCSW Phone Number: 10/25/2022, 1:23 PM  Clinical Narrative:                  CSW called pt's daughter Phil Dopp to discuss SNF. CSW explained SNF recommendation  and snf referral process. Explained medicare coverage and auth process. CSW provided daughter with medicare.gov website to search snf's and their medicare star ratings. Daughter is agreeable to SNF workup. Fl2 completed and bed requests sent in hub.   Expected Discharge Plan: Skilled Nursing Facility Barriers to Discharge: Continued Medical Work up   Patient Goals and CMS Choice            Expected Discharge Plan and Services       Living arrangements for the past 2 months: Single Family Home                                      Prior Living Arrangements/Services Living arrangements for the past 2 months: Single Family Home   Patient language and need for interpreter reviewed:: Yes        Need for Family Participation in Patient Care: Yes (Comment) Care giver support system in place?: Yes (comment)   Criminal Activity/Legal Involvement Pertinent to Current Situation/Hospitalization: No - Comment as needed  Activities of Daily Living   ADL Screening (condition at time of admission) Independently performs ADLs?: No Does the patient have a NEW difficulty with bathing/dressing/toileting/self-feeding that is expected to last >3 days?: Yes (Initiates electronic notice to provider for possible OT consult) Does the patient have a NEW difficulty with getting in/out of bed, walking, or climbing stairs that is expected to last >3 days?: Yes (Initiates electronic notice to provider for possible PT consult) Does the patient have a NEW difficulty with communication that is expected to last >3 days?: No Is the patient deaf or  have difficulty hearing?: No Does the patient have difficulty seeing, even when wearing glasses/contacts?: No Does the patient have difficulty concentrating, remembering, or making decisions?: No  Permission Sought/Granted                  Emotional Assessment         Alcohol / Substance Use: Not Applicable Psych Involvement: No (comment)  Admission diagnosis:  Dysarthria [R47.1] Generalized weakness [R53.1] AKI (acute kidney injury) (HCC) [N17.9] Acute kidney injury (HCC) [N17.9] Patient Active Problem List   Diagnosis Date Noted   Fall at home, initial encounter 10/24/2022   Generalized weakness 10/24/2022   Troponin level elevated 10/24/2022   Hypercalcemia 10/24/2022   Sterile pyuria 10/24/2022   Chronic diastolic CHF (congestive heart failure) (HCC) 10/24/2022   Paroxysmal atrial fibrillation (HCC) 10/24/2022   AKI (acute kidney injury) (HCC) 10/24/2022   Acute renal failure superimposed on stage 3b chronic kidney disease (HCC) 10/23/2022   Preoperative cardiovascular examination 07/08/2021   Suicidal ideation 07/05/2021   Acute pain of left knee 03/01/2020   Hx of total knee replacement, left 02/01/2020   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   Anemia of chronic disease 09/07/2018   Primary osteoarthritis of left knee 06/16/2018   Venous stasis dermatitis of left lower extremity 04/29/2018  Dementia (HCC) 03/12/2018   Recurrent cellulitis 02/12/2018   Essential hypertension 02/12/2018   Cellulitis and abscess of leg 12/29/2017   CKD (chronic kidney disease) stage 3, GFR 30-59 ml/min (HCC) 12/29/2017   Cellulitis of left lower extremity    Ankle edema    Sepsis (HCC) 12/21/2017   Medication management 10/11/2016   Hyperlipidemia 06/18/2016   Gait abnormality 06/18/2016   Major depressive disorder, recurrent episode, 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   Edema 02/09/2015   History of CVA  (cerebrovascular accident) 02/01/2015   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 (HCC)    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)    Chronic low back pain 01/28/2015   Short-term memory loss 01/28/2015   Volume depletion 01/28/2015   Persistent atrial fibrillation 01/26/2015   DCM (dilated cardiomyopathy) (HCC) 01/26/2015   TIA (transient ischemic attack) 01/24/2015   (HFpEF) heart failure with preserved ejection fraction (HCC)    Obesity (BMI 30-39.9) 01/27/2013   Constipation 09/02/2012   H/O arthroscopy of right knee 08/06/2012   Anxiety    Major depressive disorder    GERD (gastroesophageal reflux disease)    Hypertensive heart disease    OSA (obstructive sleep apnea)    Osteoarthritis of right knee    Hyperparathyroidism, primary (HCC) 04/22/2012   Chronic cough 10/02/2011   PCP:  Raymon Mutton., FNP Pharmacy:   [facility] [place], [place] - [address] [place] [address] Phone: [phone] Fax: [phone]  [facility] [place], [place] - [address] [place] [address] Phone: [phone] Fax: [phone]     Social Determinants of Health (SDOH) Social History: SDOH Screenings   Food Insecurity: No Food Insecurity (10/24/2022)  Housing: Low Risk  (10/24/2022)  Transportation Needs: No Transportation Needs (10/24/2022)  Utilities: Not At Risk (10/24/2022)  Alcohol Screen: Low Risk  (10/21/2017)  Depression (PHQ2-9): Medium Risk (07/05/2021)  Financial Resource Strain: Low Risk  (11/17/2017)  Physical Activity: Insufficiently Active (11/17/2017)  Social Connections: Unknown (05/21/2021)   Received from [facility]  Stress: Stress Concern Present (11/17/2017)  Tobacco Use: Medium Risk (10/24/2022)   SDOH Interventions:     Readmission Risk Interventions     No data to display

## 2022-10-25 NOTE — NC FL2 (Signed)
[place] MEDICAID FL2 LEVEL OF CARE FORM     IDENTIFICATION  Patient Name: Susan [name] Birthdate: [birth year] Sex: female Admission Date (Current Location): 10/23/2022  [facility] [place] and [place] Number:  Producer, television/film/video and Address:  [facility], [address] [place], [place] [address]      Provider Number: [id number]  Attending Physician Name and Address:  Alba Cory, MD  Relative Name and Phone Number:  Delvin, Chrzanowski (Daughter)  [phone] [facility])    Current Level of Care: Hospital Recommended Level of Care: Skilled Nursing Facility Prior Approval Number: [id number]  Date Approved/Denied:   PASRR Number: [id number]  Discharge Plan: Home    Current Diagnoses: Patient Active Problem List   Diagnosis Date Noted   Fall at home, initial encounter 10/24/2022   Generalized weakness 10/24/2022   Troponin level elevated 10/24/2022   Hypercalcemia 10/24/2022   Sterile pyuria 10/24/2022   Chronic diastolic CHF (congestive heart failure) (HCC) 10/24/2022   Paroxysmal atrial fibrillation (HCC) 10/24/2022   AKI (acute kidney injury) (HCC) 10/24/2022   Acute renal failure superimposed on stage 3b chronic kidney disease (HCC) 10/23/2022   Preoperative cardiovascular examination 07/08/2021   Suicidal ideation 07/05/2021   Acute pain of left knee 03/01/2020   Hx of total knee replacement, left 02/01/2020   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   Anemia of chronic disease 09/07/2018   Primary osteoarthritis of left knee 06/16/2018   Venous stasis dermatitis of left lower extremity 04/29/2018   Dementia (HCC) 03/12/2018   Recurrent cellulitis 02/12/2018   Essential hypertension 02/12/2018   Cellulitis and abscess of leg 12/29/2017   CKD (chronic kidney disease) stage 3, GFR 30-59 ml/min (HCC) 12/29/2017   Cellulitis of left lower extremity    Ankle edema     Sepsis (HCC) 12/21/2017   Medication management 10/11/2016   Hyperlipidemia 06/18/2016   Gait abnormality 06/18/2016   Major depressive disorder, recurrent episode, 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   Edema 02/09/2015   History of CVA (cerebrovascular accident) 02/01/2015   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 (HCC)    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)    Chronic low back pain 01/28/2015   Short-term memory loss 01/28/2015   Volume depletion 01/28/2015   Persistent atrial fibrillation 01/26/2015   DCM (dilated cardiomyopathy) (HCC) 01/26/2015   TIA (transient ischemic attack) 01/24/2015   (HFpEF) heart failure with preserved ejection fraction (HCC)    Obesity (BMI 30-39.9) 01/27/2013   Constipation 09/02/2012   H/O arthroscopy of right knee 08/06/2012   Anxiety    Major depressive disorder    GERD (gastroesophageal reflux disease)    Hypertensive heart disease    OSA (obstructive sleep apnea)    Osteoarthritis of right knee    Hyperparathyroidism, primary (HCC) 04/22/2012   Chronic cough 10/02/2011    Orientation RESPIRATION BLADDER Height & Weight     Self, Place  Normal Incontinent, External catheter Weight: 151 lb 0.2 oz (68.5 kg) Height:  5\' 1"  (154.9 cm)  BEHAVIORAL SYMPTOMS/MOOD NEUROLOGICAL BOWEL NUTRITION STATUS      Continent Diet (See d/c summary)  AMBULATORY STATUS COMMUNICATION OF NEEDS Skin   Extensive Assist Verbally Normal                       Personal Care Assistance Level of Assistance  Bathing, Feeding, Dressing Bathing Assistance: Limited assistance Feeding assistance: Independent Dressing Assistance:  Limited assistance     Functional Limitations Info  Sight, Hearing, Speech Sight Info: Adequate Hearing Info: Adequate Speech Info: Adequate    SPECIAL CARE FACTORS FREQUENCY  OT (By licensed OT), PT (By licensed PT)     PT Frequency: 5x/week OT Frequency: 5x/week            Contractures Contractures  Info: Not present    Additional Factors Info  Code Status, Allergies Code Status Info: Full code Allergies Info: ace inhibitors, Lipitor (atorvastatin), Simvastatin, latex           Current Medications (10/25/2022):  This is the current hospital active medication list Current Facility-Administered Medications  Medication Dose Route Frequency Provider Last Rate Last Admin   acetaminophen (TYLENOL) tablet 650 mg  650 mg Oral Q6H PRN Howerter, Justin B, DO       Or   acetaminophen (TYLENOL) suppository 650 mg  650 mg Rectal Q6H PRN Howerter, Justin B, DO       albuterol (PROVENTIL) (2.5 MG/3ML) 0.083% nebulizer solution 2.5 mg  2.5 mg Nebulization Q6H PRN Regalado, Belkys A, MD       apixaban (ELIQUIS) tablet 2.5 mg  2.5 mg Oral BID Howerter, Justin B, DO   2.5 mg at 10/25/22 0826   busPIRone (BUSPAR) tablet 15 mg  15 mg Oral BID Howerter, Justin B, DO   15 mg at 10/25/22 0826   donepezil (ARICEPT) tablet 10 mg  10 mg Oral QHS Howerter, Justin B, DO   10 mg at 10/24/22 2345   ferrous sulfate tablet 325 mg  325 mg Oral BID Howerter, Justin B, DO   325 mg at 10/25/22 1610   hydrALAZINE (APRESOLINE) tablet 25 mg  25 mg Oral TID Howerter, Justin B, DO   25 mg at 10/25/22 9604   melatonin tablet 3 mg  3 mg Oral QHS PRN Howerter, Justin B, DO       mometasone-formoterol (DULERA) 100-5 MCG/ACT inhaler 2 puff  2 puff Inhalation BID Regalado, Belkys A, MD   2 puff at 10/25/22 0827   ondansetron (ZOFRAN) injection 4 mg  4 mg Intravenous Q6H PRN Howerter, Justin B, DO       Oral care mouth rinse  15 mL Mouth Rinse PRN Howerter, Justin B, DO       pravastatin (PRAVACHOL) tablet 40 mg  40 mg Oral Daily Howerter, Justin B, DO   40 mg at 10/25/22 5409   sertraline (ZOLOFT) tablet 100 mg  100 mg Oral Daily Howerter, Justin B, DO   100 mg at 10/25/22 8119     Discharge Medications: Please see discharge summary for a list of discharge medications.  Relevant Imaging Results:  Relevant Lab  Results:   Additional Information SSN: [id number]  Erin Sons, LCSW

## 2022-10-25 NOTE — Progress Notes (Signed)
Patient ID: Susan [name], female   DOB: 1940/06/18, 82 y.o.   MRN: [id number] BP (!) 184/89 (BP Location: Left Arm)   Pulse 67   Temp 97.7 F (36.5 C) (Oral)   Resp 16   Ht 5\' 1"  (1.549 m)   Wt 68.5 kg   SpO2 100%   BMI 28.53 kg/m  Alert and oriented x 4, speech is clear and fluent. Moving all extremities MRI shows severe stenosis at L4/5 with spondylolisthesis, bad stenosis at L3/4. A great deal of facet arthropathy at these levels, worse again at L4/5.  Compared to an MRI in 2016 there is only slight progression. The stenosis was also quite severe 9 years ago. There is absolutely nothing emergent except for the metabolic derangement caused by the kidney failure. She has no desire for an operation and I would agree. Can follow up as outpatient

## 2022-10-26 DIAGNOSIS — N179 Acute kidney failure, unspecified: Secondary | ICD-10-CM | POA: Diagnosis not present

## 2022-10-26 LAB — BASIC METABOLIC PANEL
Anion gap: 9 (ref 5–15)
BUN: 22 mg/dL (ref 8–23)
CO2: 24 mmol/L (ref 22–32)
Calcium: 9.9 mg/dL (ref 8.9–10.3)
Chloride: 106 mmol/L (ref 98–111)
Creatinine, Ser: 1.49 mg/dL — ABNORMAL HIGH (ref 0.44–1.00)
GFR, Estimated: 34 mL/min — ABNORMAL LOW (ref >60)
Glucose, Bld: 91 mg/dL (ref 70–99)
Potassium: 4.1 mmol/L (ref 3.5–5.1)
Sodium: 139 mmol/L (ref 135–145)

## 2022-10-26 LAB — CBC
HCT: 35.3 % — ABNORMAL LOW (ref 36.0–46.0)
Hemoglobin: 11.6 g/dL — ABNORMAL LOW (ref 12.0–15.0)
MCH: 31 pg (ref 26.0–34.0)
MCHC: 32.9 g/dL (ref 30.0–36.0)
MCV: 94.4 fL (ref 80.0–100.0)
Platelets: 160 10*3/uL (ref 150–400)
RBC: 3.74 MIL/uL — ABNORMAL LOW (ref 3.87–5.11)
RDW: 13.1 % (ref 11.5–15.5)
WBC: 4.1 10*3/uL (ref 4.0–10.5)
nRBC: 0 % (ref 0.0–0.2)

## 2022-10-26 MED ORDER — PANTOPRAZOLE SODIUM 40 MG PO TBEC
40.0000 mg | DELAYED_RELEASE_TABLET | Freq: Every day | ORAL | Status: DC
  Administered 2022-10-26 – 2022-10-30 (×5): 40 mg via ORAL
  Filled 2022-10-26 (×5): qty 1

## 2022-10-26 MED ORDER — METHYLPREDNISOLONE 4 MG PO TBPK
8.0000 mg | ORAL_TABLET | Freq: Every evening | ORAL | Status: AC
  Administered 2022-10-26: 8 mg via ORAL

## 2022-10-26 MED ORDER — METHYLPREDNISOLONE 4 MG PO TBPK
8.0000 mg | ORAL_TABLET | Freq: Every evening | ORAL | Status: AC
  Administered 2022-10-27: 8 mg via ORAL

## 2022-10-26 MED ORDER — METHYLPREDNISOLONE 4 MG PO TBPK
4.0000 mg | ORAL_TABLET | ORAL | Status: AC
  Administered 2022-10-26: 4 mg via ORAL

## 2022-10-26 MED ORDER — METHYLPREDNISOLONE 4 MG PO TBPK
4.0000 mg | ORAL_TABLET | Freq: Four times a day (QID) | ORAL | Status: DC
  Administered 2022-10-28 – 2022-10-30 (×8): 4 mg via ORAL

## 2022-10-26 MED ORDER — AMLODIPINE BESYLATE 5 MG PO TABS
5.0000 mg | ORAL_TABLET | Freq: Every day | ORAL | Status: DC
  Administered 2022-10-26 – 2022-10-27 (×2): 5 mg via ORAL
  Filled 2022-10-26 (×2): qty 1

## 2022-10-26 MED ORDER — METHYLPREDNISOLONE 4 MG PO TBPK
8.0000 mg | ORAL_TABLET | Freq: Every morning | ORAL | Status: AC
  Administered 2022-10-26: 8 mg via ORAL
  Filled 2022-10-26: qty 21

## 2022-10-26 MED ORDER — METHYLPREDNISOLONE 4 MG PO TBPK
4.0000 mg | ORAL_TABLET | Freq: Three times a day (TID) | ORAL | Status: AC
  Administered 2022-10-27 (×3): 4 mg via ORAL

## 2022-10-26 NOTE — Progress Notes (Signed)
Pt set up w/last known CPAP setting of +10   10/26/22 2328  BiPAP/CPAP/SIPAP  $ Non-Invasive Home Ventilator  Initial  $ Face Mask Large  Yes  $ Face Mask Medium Yes  BiPAP/CPAP/SIPAP Pt Type Adult  BiPAP/CPAP/SIPAP DREAMSTATIOND  Mask Type Full face mask  Mask Size Large (w/med head gear)  EPAP 10 cmH2O  FiO2 (%) 21 %  BiPAP/CPAP /SiPAP Vitals  Resp 16

## 2022-10-26 NOTE — Progress Notes (Signed)
Pt had incontinent BM during shift, black, liquid stool, malodorous for possible blood.  Unable to obtain sample from pad, no further BM during shift. Will continue to monitor.

## 2022-10-26 NOTE — Progress Notes (Signed)
PROGRESS NOTE    Susan [name]  XBM:[id number] DOB: 06/04/1940 DOA: 10/23/2022 PCP: Raymon Mutton., FNP   Brief Narrative: 82 year old with past medical history significant for chronic diastolic heart failure, hyperlipidemia, paroxysmal A-fib on Eliquis, anemia of chronic kidney disease, CKD 3B associated baseline creatinine 1.4-1.7 presents with generalized weakness, found to have AKI on CKD 3B.  Patient also reports 2 ground-level mechanical fall. Found to have hypercalcemia.   Awaiting SNF.    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  -Creatinine baseline 1.4--- 1.7 -Present with creatinine 2.2 -Continue to hold losartan and Lasix. -Strict I's and O's -Received IV fluids -Improved. Cr down to 1.5. stop fluids encourage oral intake. -Elevation of CK. Trending down 599---312 Stable.   2-Generalized weakness -CT head no evidence of acute intracranial process -In the setting of AKI and dehydration, hypercalcemia -PT, OT. Recommend rehab.  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   Back pain, Moderate to severe spinal stenosis L 4-L 5 and moderate stenosis L 3 L 4  MRI: L4-L5 moderate to severe spinal canal stenosis with moderate right and mild left neural foraminal narrowing. Effacement of the lateral recesses at this level likely compresses the descending L5 nerve roots. L3-L4 moderate spinal canal stenosis and mild bilateral neural foraminal narrowing. Narrowing of the lateral recesses at this level could affect the  descending L4 nerve roots. -she is able to move LE better, she report improvement of back pain.  -Dr Franky Macho will see patient in consultation. No surgical recommendation at this time Patient still having pain, limiting mobility. Will start Medrol dose pack.   HTN; start Norvasc. Continue with hydralazine.   Feet pain; uric acid normal.    Hypokalemia; Replaced  Mild Hypercalcemia: -Hold calcium supplementation -Resolved with fluids.   Ground-level mechanical fall PT OT eval. Needs rehab  Ankle sprain: X ray: Mildly increased generalized soft tissue swelling. No acute osseous findings or other significant changes compared with prior studies. Brace ordered.  Swelling improved.   Elevated troponin 30 range.  Suspect related to AKI EKG no acute changes Chest pain-free  Pyuria: Follow urine culture.   Chronic diastolic heart failure Holding Lasix in the setting of AKI and dehydration  Hyperlipidemia: Continue with Pravachol/    Paroxysmal A-fib Continue with With Eliquis Hold Coreg in the setting of bradycardia    Bradycardia Hold coreg.  Asymptomatic. Improved.   Anemia of chronic diseases:  Baseline Hb 10-12 Hb stable at 10.       Estimated body mass index is 28.53 kg/m as calculated from the following:   Height as of this encounter: 5\' 1"  (1.549 m).   Weight as of this encounter: 68.5 kg.   DVT prophylaxis: Eliquis  Code Status: Full code Family Communication:Care discussed with patient.  Disposition Plan:  Status is: Observation The patient remains OBS appropriate and will d/c before 2 midnights.    Consultants:  None  Procedures:  None  Antimicrobials:    Subjective: She is having Back pain, she is  fine if she is still, if she trys to stand or walk hurts a lot      Objective: Vitals:   10/25/22 2100 10/26/22 0500 10/26/22 0521 10/26/22 0844  BP: (!) 180/94  (!) 190/93 (!) 144/73  Pulse: 64  65 61  Resp: 18  19   Temp: 98.3 F  (36.8 C)  98.6 F (37 C) 98.4 F (36.9 C)  TempSrc:    Oral  SpO2: 99%  98%   Weight:  68.5 kg    Height:        Intake/Output Summary (Last 24 hours) at 10/26/2022 1335 Last data filed at 10/26/2022 0900 Gross per 24 hour  Intake 420 ml  Output 400 ml  Net 20 ml   Filed Weights   10/23/22 1339 10/25/22 0500 10/26/22 0500  Weight: 68.5 kg 68.5 kg 68.5 kg    Examination:  General exam: NAD Respiratory system: CTA Cardiovascular system: S 1, S 2  RRR Gastrointestinal system: BS Present, soft, nt,  Extremities: Left ankle with less swelling.     Data Reviewed: I have personally reviewed following labs and imaging studies  CBC: Recent Labs  Lab 10/23/22 1348 10/24/22 0444 10/26/22 1206  WBC 3.8* 3.4* 4.1  NEUTROABS  --  2.1  --   HGB 11.5* 10.4* 11.6*  HCT 34.6* 30.8* 35.3*  MCV 93.8 91.7 94.4  PLT 201 187 160   Basic Metabolic Panel: Recent Labs  Lab 10/23/22 1348 10/24/22 0444 10/25/22 1045 10/26/22 1206  NA 139 142 141 139  K 4.3 3.1* 4.2 4.1  CL 107 108 110 106  CO2 24 23 23 24   GLUCOSE 124* 100* 91 91  BUN 40* 28* 20 22  CREATININE 2.27* 1.71* 1.58* 1.49*  CALCIUM 10.7* 10.6* 9.7 9.9  MG  --  1.8  --   --   PHOS  --  2.8  --   --    GFR: Estimated Creatinine Clearance: 24.9 mL/min (A) (by C-G formula based on SCr of 1.49 mg/dL (H)). Liver Function Tests: Recent Labs  Lab 10/24/22 0444  AST 43*  ALT 23  ALKPHOS 47  BILITOT 0.5  PROT 5.9*  ALBUMIN 3.3*   No results for input(s): "LIPASE", "AMYLASE" in the last 168 hours. No results for input(s): "AMMONIA" in the last 168 hours. Coagulation Profile: No results for input(s): "INR", "PROTIME" in the last 168 hours. Cardiac Enzymes: Recent Labs  Lab 10/24/22 0444 10/25/22 1045  CKTOTAL 599* 312*   BNP (last 3 results) No results for input(s): "PROBNP" in the last 8760 hours. HbA1C: No results for input(s): "HGBA1C" in the last 72 hours. CBG: Recent Labs  Lab 10/23/22 1334   GLUCAP 108*   Lipid Profile: No results for input(s): "CHOL", "HDL", "LDLCALC", "TRIG", "CHOLHDL", "LDLDIRECT" in the last 72 hours. Thyroid Function Tests: Recent Labs    10/24/22 0444  TSH 0.735   Anemia Panel: Recent Labs    10/24/22 0817  VITAMINB12 837   Sepsis Labs: No results for input(s): "PROCALCITON", "LATICACIDVEN" in the last 168 hours.  Recent Results (from the past 240 hour(s))  Urine Culture     Status: Abnormal   Collection Time: 10/23/22  2:53 PM   Specimen: Urine, Clean Catch  Result Value Ref Range Status   Specimen Description   Final    URINE, CLEAN CATCH Performed at [facility], [address], [place], [place] [address]    Special Requests   Final    NONE Performed at  [facility], [address] [place], [place] [address]    Culture (A)  Final    10,000 COLONIES/mL MULTIPLE SPECIES PRESENT, SUGGEST RECOLLECTION   Report Status 10/24/2022 FINAL  Final         Radiology Studies: MR LUMBAR SPINE WO CONTRAST  Result Date: 10/25/2022 CLINICAL DATA:  Generalized weakness, multiple falls EXAM: MRI LUMBAR SPINE WITHOUT CONTRAST TECHNIQUE: Multiplanar, multisequence MR imaging of the lumbar spine was performed. No intravenous contrast was administered. COMPARISON:  06/07/2014 FINDINGS: Segmentation:  5 lumbar type vertebral bodies. Alignment: Levocurvature. Trace anterolisthesis of L3 on L4. 5 mm anterolisthesis of L4 on L5. Vertebrae: No acute fracture, evidence of discitis, or suspicious osseous lesion. Conus medullaris and cauda equina: Conus extends to the L2 level. Conus and cauda equina appear normal. Paraspinal and other soft tissues: Multiple renal cysts, for which no follow-up is currently indicated. Disc levels: T11-T12: Mild disc bulge. Mild facet arthropathy. No spinal canal stenosis or neural foraminal narrowing. T12-L1: Minimal disc bulge. Mild facet arthropathy. No spinal canal stenosis or neural  foraminal narrowing. L1-L2: No significant disc bulge. Mild facet arthropathy. No spinal canal stenosis or neural foraminal narrowing. L2-L3: Minimal disc bulge. Mild facet arthropathy. No spinal canal stenosis. Mild left neural foraminal narrowing. L3-L4: Mild-to-moderate disc bulge with superimposed right foraminal and extreme lateral protrusion. Moderate to severe facet arthropathy. Ligamentum flavum hypertrophy. Narrowing of the lateral recesses. Moderate spinal canal stenosis, which has progressed since 2016. Mild bilateral neural foraminal narrowing. L4-L5: Grade 1 anterolisthesis with disc unroofing and right eccentric disc bulge. Moderate facet arthropathy. Ligamentum flavum hypertrophy. Effacement of the lateral recesses. Moderate to severe spinal canal stenosis, unchanged. Moderate right and mild left neural foraminal narrowing. L5-S1: Mild disc bulge with central protrusion with annular fissure. No spinal canal stenosis or neural foraminal narrowing. IMPRESSION: 1. L4-L5 moderate to severe spinal canal stenosis with moderate right and mild left neural foraminal narrowing. Effacement of the lateral recesses at this level likely compresses the descending L5 nerve roots. 2. L3-L4 moderate spinal canal stenosis and mild bilateral neural foraminal narrowing. Narrowing of the lateral recesses at this level could affect the descending L4 nerve roots. 3. L2-L3 mild left neural foraminal narrowing. Electronically Signed   By: Wiliam Ke M.D.   On: 10/25/2022 02:40        Scheduled Meds:  amLODipine  5 mg Oral Daily   apixaban  2.5 mg Oral BID   busPIRone  15 mg Oral BID   donepezil  10 mg Oral QHS   ferrous sulfate  325 mg Oral BID   hydrALAZINE  25 mg Oral TID   methylPREDNISolone  4 mg Oral PC lunch   methylPREDNISolone  4 mg Oral PC supper   [START ON 10/27/2022] methylPREDNISolone  4 mg Oral 3 x daily with food   [START ON 10/28/2022] methylPREDNISolone  4 mg Oral 4X daily taper    methylPREDNISolone  8 mg Oral Nightly   [START ON 10/27/2022] methylPREDNISolone  8 mg Oral Nightly   mometasone-formoterol  2 puff Inhalation BID   pantoprazole  40 mg Oral Daily   pravastatin  40 mg Oral Daily   sertraline  100 mg Oral Daily   Continuous Infusions:   LOS: 2 days    Time spent: 35 minutes    Sayuri Rhames A Stephen Turnbaugh, MD [facility]   If 7PM-7AM, please contact night-coverage [email or web address]  10/26/2022, 1:35 PM

## 2022-10-26 NOTE — Plan of Care (Signed)

## 2022-10-26 NOTE — Plan of Care (Signed)

## 2022-10-27 DIAGNOSIS — N179 Acute kidney failure, unspecified: Secondary | ICD-10-CM | POA: Diagnosis not present

## 2022-10-27 MED ORDER — GABAPENTIN 100 MG PO CAPS
100.0000 mg | ORAL_CAPSULE | Freq: Two times a day (BID) | ORAL | Status: DC
  Administered 2022-10-27 – 2022-10-30 (×7): 100 mg via ORAL
  Filled 2022-10-27 (×7): qty 1

## 2022-10-27 MED ORDER — AMLODIPINE BESYLATE 10 MG PO TABS
10.0000 mg | ORAL_TABLET | Freq: Every day | ORAL | Status: DC
  Administered 2022-10-28 – 2022-10-30 (×3): 10 mg via ORAL
  Filled 2022-10-27 (×3): qty 1

## 2022-10-27 NOTE — Plan of Care (Signed)

## 2022-10-27 NOTE — Progress Notes (Signed)
   10/27/22 1956  BiPAP/CPAP/SIPAP  BiPAP/CPAP/SIPAP Pt Type Adult  Patient Home Equipment Yes (patient home unit set up at bedside and ready for usage. patient states will wear once ready for bed)  BiPAP/CPAP /SiPAP Vitals  Pulse Rate 70  Resp 19  Bilateral Breath Sounds Clear;Diminished

## 2022-10-27 NOTE — Progress Notes (Signed)
PROGRESS NOTE    Susan [name]  EXB:[id number] DOB: Jan 12, 1940 DOA: 10/23/2022 PCP: Raymon Mutton., FNP   Brief Narrative: 82 year old with past medical history significant for chronic diastolic heart failure, hyperlipidemia, paroxysmal A-fib on Eliquis, anemia of chronic kidney disease, CKD 3B associated baseline creatinine 1.4-1.7 presents with generalized weakness, found to have AKI on CKD 3B.  Patient also reports 2 ground-level mechanical fall. Found to have hypercalcemia.   Awaiting SNF.    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  -Creatinine baseline 1.4--- 1.7 -Present with creatinine 2.2 -Continue to hold losartan and Lasix. -Strict I's and O's -Received IV fluids -Improved. Cr down to 1.4 -Elevation of CK. Trending down 599---312 Stable.   2-Generalized weakness -CT head no evidence of acute intracranial process -In the setting of AKI and dehydration, hypercalcemia -PT, OT. Recommend rehab.  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   Back pain, Moderate to severe spinal stenosis L 4-L 5 and moderate stenosis L 3 L 4  MRI: L4-L5 moderate to severe spinal canal stenosis with moderate right and mild left neural foraminal narrowing. Effacement of the lateral recesses at this level likely compresses the descending L5 nerve roots. L3-L4 moderate spinal canal stenosis and mild bilateral neural foraminal narrowing. Narrowing of the lateral recesses at this level could affect the descending L4 nerve roots. -she is  able to move LE better, she report improvement of back pain.  -Dr Franky Macho will see patient in consultation. No surgical recommendation at this time Patient still having pain, limiting mobility. Started  Medrol dose pack. Pain improving.   HTN; Increase  Norvasc. Continue with hydralazine.   Neuropathy  Feet pain; uric acid normal.  Start Low dose gabapentin.   Hypokalemia; Replaced  Mild Hypercalcemia: -Hold calcium supplementation -Resolved with fluids.   Ground-level mechanical fall PT OT eval. Needs rehab  Ankle sprain: X ray: Mildly increased generalized soft tissue swelling. No acute osseous findings or other significant changes compared with prior studies. Brace ordered.  Swelling improved.   Elevated troponin 30 range.  Suspect related to AKI EKG no acute changes Chest pain-free  Pyuria: Urine culture. Insignificant growth   Chronic diastolic heart failure Holding Lasix in the setting of AKI and dehydration  Hyperlipidemia: Continue with Pravachol/    Paroxysmal A-fib Continue with With Eliquis Hold Coreg in the setting of bradycardia    Bradycardia Hold coreg.  Asymptomatic. Improved.   Anemia of chronic diseases:  Baseline Hb 10-12 Hb stable at 10.       Estimated body mass index is 28.53 kg/m as calculated from the following:   Height as of this encounter: 5\' 1"  (1.549 m).   Weight as of this encounter: 68.5 kg.   DVT prophylaxis: Eliquis  Code Status: Full code Family Communication:Care discussed with patient.  Disposition Plan:  Status is: Observation The patient remains OBS appropriate and will d/c before 2 midnights.    Consultants:  None  Procedures:  None  Antimicrobials:    Subjective: She is  feeling ok, needs helps with cutting pancakes.  Report back pain is ok.  Still having neuropathy pain in her feet.   Objective: Vitals:   10/26/22 2328 10/27/22 0506 10/27/22 0828 10/27/22 0854  BP:  (!) 169/72  (!) 180/81   Pulse:  63  (!) 52  Resp: 16 18  18   Temp:  98.6 F (37 C)  98.2 F (36.8 C)  TempSrc:      SpO2:  98% 99% 100%  Weight:      Height:        Intake/Output Summary (Last 24 hours) at 10/27/2022 1357 Last data filed at 10/27/2022 1338 Gross per 24 hour  Intake 1190 ml  Output 1125 ml  Net 65 ml   Filed Weights   10/23/22 1339 10/25/22 0500 10/26/22 0500  Weight: 68.5 kg 68.5 kg 68.5 kg    Examination:  General exam: NAD Respiratory system: CTA Cardiovascular system: S 1, S 2 RRR Gastrointestinal system: BS present, soft, nt Extremities: left ankle, less swelling.     Data Reviewed: I have personally reviewed following labs and imaging studies  CBC: Recent Labs  Lab 10/23/22 1348 10/24/22 0444 10/26/22 1206  WBC 3.8* 3.4* 4.1  NEUTROABS  --  2.1  --   HGB 11.5* 10.4* 11.6*  HCT 34.6* 30.8* 35.3*  MCV 93.8 91.7 94.4  PLT 201 187 160   Basic Metabolic Panel: Recent Labs  Lab 10/23/22 1348 10/24/22 0444 10/25/22 1045 10/26/22 1206  NA 139 142 141 139  K 4.3 3.1* 4.2 4.1  CL 107 108 110 106  CO2 24 23 23 24   GLUCOSE 124* 100* 91 91  BUN 40* 28* 20 22  CREATININE 2.27* 1.71* 1.58* 1.49*  CALCIUM 10.7* 10.6* 9.7 9.9  MG  --  1.8  --   --   PHOS  --  2.8  --   --    GFR: Estimated Creatinine Clearance: 24.9 mL/min (A) (by C-G formula based on SCr of 1.49 mg/dL (H)). Liver Function Tests: Recent Labs  Lab 10/24/22 0444  AST 43*  ALT 23  ALKPHOS 47  BILITOT 0.5  PROT 5.9*  ALBUMIN 3.3*   No results for input(s): "LIPASE", "AMYLASE" in the last 168 hours. No results for input(s): "AMMONIA" in the last 168 hours. Coagulation Profile: No results for input(s): "INR", "PROTIME" in the last 168 hours. Cardiac Enzymes: Recent Labs  Lab 10/24/22 0444 10/25/22 1045  CKTOTAL 599* 312*   BNP (last 3 results) No results for input(s): "PROBNP" in the last 8760 hours. HbA1C: No results for input(s): "HGBA1C" in the last 72 hours. CBG: Recent Labs   Lab 10/23/22 1334  GLUCAP 108*   Lipid Profile: No results for input(s): "CHOL", "HDL", "LDLCALC", "TRIG", "CHOLHDL", "LDLDIRECT" in the last 72 hours. Thyroid Function Tests: No results for input(s): "TSH", "T4TOTAL", "FREET4", "T3FREE", "THYROIDAB" in the last 72 hours.  Anemia Panel: No results for input(s): "VITAMINB12", "FOLATE", "FERRITIN", "TIBC", "IRON", "RETICCTPCT" in the last 72 hours.  Sepsis Labs: No results for input(s): "PROCALCITON", "LATICACIDVEN" in the last 168 hours.  Recent Results (from the past 240 hour(s))  Urine Culture     Status: Abnormal   Collection Time: 10/23/22  2:53 PM   Specimen: Urine, Clean Catch  Result Value Ref Range Status   Specimen Description   Final    URINE, CLEAN CATCH Performed at [facility], [address] [place], [place] [address]    Special Requests   Final  NONE Performed at [facility], [address] [place], [place] [address]    Culture (A)  Final    10,000 COLONIES/mL MULTIPLE SPECIES PRESENT, SUGGEST RECOLLECTION   Report Status 10/24/2022 FINAL  Final         Radiology Studies: No results found.      Scheduled Meds:  [START ON 10/28/2022] amLODipine  10 mg Oral Daily   apixaban  2.5 mg Oral BID   busPIRone  15 mg Oral BID   donepezil  10 mg Oral QHS   ferrous sulfate  325 mg Oral BID   gabapentin  100 mg Oral BID   hydrALAZINE  25 mg Oral TID   methylPREDNISolone  4 mg Oral 3 x daily with food   [START ON 10/28/2022] methylPREDNISolone  4 mg Oral 4X daily taper   methylPREDNISolone  8 mg Oral Nightly   mometasone-formoterol  2 puff Inhalation BID   pantoprazole  40 mg Oral Daily   pravastatin  40 mg Oral Daily   sertraline  100 mg Oral Daily   Continuous Infusions:   LOS: 3 days    Time spent: 35 minutes    Sadrac Zeoli A Reet Scharrer, MD [facility]   If 7PM-7AM, please contact night-coverage [email or web address]  10/27/2022, 1:57 PM

## 2022-10-28 DIAGNOSIS — N179 Acute kidney failure, unspecified: Secondary | ICD-10-CM | POA: Diagnosis not present

## 2022-10-28 LAB — BASIC METABOLIC PANEL
Anion gap: 8 (ref 5–15)
BUN: 29 mg/dL — ABNORMAL HIGH (ref 8–23)
CO2: 25 mmol/L (ref 22–32)
Calcium: 10.4 mg/dL — ABNORMAL HIGH (ref 8.9–10.3)
Chloride: 105 mmol/L (ref 98–111)
Creatinine, Ser: 1.73 mg/dL — ABNORMAL HIGH (ref 0.44–1.00)
GFR, Estimated: 29 mL/min — ABNORMAL LOW (ref >60)
Glucose, Bld: 123 mg/dL — ABNORMAL HIGH (ref 70–99)
Potassium: 3.8 mmol/L (ref 3.5–5.1)
Sodium: 138 mmol/L (ref 135–145)

## 2022-10-28 NOTE — Care Management Important Message (Signed)
Important Message  Patient Details  Name: Susan [name] MRN: [id number] Date of Birth: November 07, 1940   Important Message Given:  Yes - Medicare IM     Dorena Bodo 10/28/2022, 3:31 PM

## 2022-10-28 NOTE — Plan of Care (Signed)

## 2022-10-28 NOTE — TOC Progression Note (Signed)
Transition of Care [facility]) - Progression Note    Patient Details  Name: Susan [name] MRN: [id number] Date of Birth: 12-Jul-1940  Transition of Care [facility]) CM/SW Contact  Erin Sons, [place] Phone Number: 10/28/2022, 1:42 PM  Clinical Narrative:     CSW called daughter Phil Dopp to discuss SNF bed offers. CSW emailed list to daughter at [email or web address]. Daughter will review with family and follow up with CSW with choice.   Expected Discharge Plan: Skilled Nursing Facility Barriers to Discharge: Continued Medical Work up  Expected Discharge Plan and Services       Living arrangements for the past 2 months: Single Family Home                                       Social Determinants of Health (SDOH) Interventions SDOH Screenings   Food Insecurity: No Food Insecurity (10/24/2022)  Housing: Low Risk  (10/24/2022)  Transportation Needs: No Transportation Needs (10/24/2022)  Utilities: Not At Risk (10/24/2022)  Alcohol Screen: Low Risk  (10/21/2017)  Depression (PHQ2-9): Medium Risk (07/05/2021)  Financial Resource Strain: Low Risk  (11/17/2017)  Physical Activity: Insufficiently Active (11/17/2017)  Social Connections: Unknown (05/21/2021)   [facility]  Stress: Stress Concern Present (11/17/2017)  Tobacco Use: Medium Risk (10/24/2022)    Readmission Risk Interventions     No data to display

## 2022-10-28 NOTE — Progress Notes (Signed)
Physical Therapy Treatment Patient Details Name: LITTLE CRATER MRN: [id number] DOB: [birth year] Today's Date: 10/28/2022   History of Present Illness Patient is a 82 year old female with generalized weakness,  AKI superimposed on CKD 3B. Two recent falls. Her CT head showed no acute intracranial abnormality.  Her CT C-spine showed no acute fracture.  Her CT lumbar spine showed no acute osseous abnormality but she did have some spinal stenosis at L4/5 and L3/4. X-ray showing L ankle sprain. History of chronic diastolic heart failure, hyperlipidemia, paroxysmal atrial fibrillation, anemia, CKD.    PT Comments  Pt received in supine, agreeable to therapy session and pleasantly cooperative with good progress toward functional mobility goals this date. Pt needing up to minA for bed mobility and transfers to/from [facility] and mod cues for step-to gait pattern for improved posture/RW management and energy conservation. Pt reports only mild LLE pain with ASO brace donned for standing tasks and 5/10 modified RPE (fatigue) after shorter distance gait trial in room. Pt continues to benefit from PT services to progress toward functional mobility goals.     If plan is discharge home, recommend the following: A lot of help with bathing/dressing/bathroom;Assistance with cooking/housework;Help with stairs or ramp for entrance;Assist for transportation;A lot of help with walking and/or transfers   Can travel by private vehicle      (could do wheelchair van at current status)  Equipment Recommendations  None recommended by PT (TBD post-acute)    Recommendations for Other Services       Precautions / Restrictions Precautions Precautions: Fall Required Braces or Orthoses: Other Brace Other Brace: L ASO Restrictions Weight Bearing Restrictions: No     Mobility  Bed Mobility Overal bed mobility: Needs Assistance Bed Mobility: Sit to Supine       Sit to supine: Used rails, Contact guard assist   General  bed mobility comments: CGA for safety bringing BLE over EOB; heels floated once in supine; +2 maxA for posterior supine scooting toward HOB with bed in trend, pt holding overhead rail.    Transfers Overall transfer level: Needs assistance Equipment used: Rolling walker (2 wheels) Transfers: Sit to/from Stand, Bed to chair/wheelchair/BSC Sit to Stand: Min assist, From elevated surface   Step pivot transfers: Min assist       General transfer comment: from EOB>BSC using RW, then see gait comments for trial. No buckling, cues for safe UE placement needed with transfers    Ambulation/Gait Ambulation/Gait assistance: Min assist Gait Distance (Feet): 18 Feet (81ft, seated break, 10ft in room) Assistive device: Rolling walker (2 wheels) Gait Pattern/deviations: Step-to pattern, Decreased stance time - left, Decreased weight shift to left, Decreased step length - right, Trunk flexed Gait velocity: decreased, grossly <0.3 m/s     General Gait Details: Cues for upright posture, improved step length, proximity to RW, activity pacing; chair follow initially but pt needing to sit on BSC for BM, then short distance around bed in room due to c/o fatigue and LLE pain. Pt reports 5/10 modified RPE post-exertion.   Stairs             Wheelchair Mobility     Tilt Bed    Modified Rankin (Stroke Patients Only)       Balance Overall balance assessment: Needs assistance Sitting-balance support: Feet supported, No upper extremity supported Sitting balance-Leahy Scale: Fair Sitting balance - Comments: EOB   Standing balance support: Single extremity supported, During functional activity Standing balance-Leahy Scale: Poor Standing balance comment: Fair static standing;  pt able to perform anterior peri-care after toileting with CGA and one hand on RW, needs assist for posterior peri-care; reliant on RW for gait                            Cognition Arousal: Alert Behavior  During Therapy: [facility] for tasks assessed/performed Overall Cognitive Status: No family/caregiver present to determine baseline cognitive functioning                                 General Comments: Overall WFLs, some insight into deficits, states memory isn't as good as it used to be. Pt following simple commands well, able to make her needs known.        Exercises Other Exercises Other Exercises: reviewed supine BLE AROM: ankle pumps, SLR, hip ab/adduction x5-10 reps ea    General Comments General comments (skin integrity, edema, etc.): No acute s/sx distress, pt heels floated for comfort once back in supine. Greenish black color to bm (pt taking ferrous sulfate).      Pertinent Vitals/Pain Pain Assessment Pain Location: L ankle Pain Descriptors / Indicators: Discomfort    Home Living                          Prior Function            PT Goals (current goals can now be found in the care plan section) Acute Rehab PT Goals Patient Stated Goal: to return home once I am stronger PT Goal Formulation: With patient Time For Goal Achievement: 10/31/22 Progress towards PT goals: Progressing toward goals    Frequency    Min 1X/week      PT Plan      Co-evaluation              AM-PAC PT "6 Clicks" Mobility   Outcome Measure  Help needed turning from your back to your side while in a flat bed without using bedrails?: A Little Help needed moving from lying on your back to sitting on the side of a flat bed without using bedrails?: A Little Help needed moving to and from a bed to a chair (including a wheelchair)?: A Little Help needed standing up from a chair using your arms (e.g., wheelchair or bedside chair)?: A Little Help needed to walk in hospital room?: A Lot Help needed climbing 3-5 steps with a railing? : Total 6 Click Score: 15    End of Session Equipment Utilized During Treatment: Gait belt;Other (comment) (LLE ASO brace) Activity  Tolerance: Patient tolerated treatment well;Patient limited by fatigue Patient left: in bed;with call bell/phone within reach;with bed alarm set;Other (comment) (pt already got up to chair ~4 hours earlier in the day.) Nurse Communication: Mobility status PT Visit Diagnosis: Unsteadiness on feet (R26.81);Muscle weakness (generalized) (M62.81)     Time: 1914-7829 PT Time Calculation (min) (ACUTE ONLY): 32 min  Charges:    $Gait Training: 8-22 mins $Therapeutic Activity: 8-22 mins PT General Charges $$ ACUTE PT VISIT: 1 Visit                     Ronique Simerly P., PTA Acute Rehabilitation Services Secure Chat Preferred 9a-5:30pm Office: [phone]    Dorathy K[facility]man [facility] 10/28/2022, 7:05 PM

## 2022-10-28 NOTE — Progress Notes (Signed)
PROGRESS NOTE    Susan [name]  ZOX:[id number] DOB: 1940/03/08 DOA: 10/23/2022 PCP: Raymon Mutton., FNP   Brief Narrative: 82 year old with past medical history significant for chronic diastolic heart failure, hyperlipidemia, paroxysmal A-fib on Eliquis, anemia of chronic kidney disease, CKD 3B associated baseline creatinine 1.4-1.7 presents with generalized weakness, found to have AKI on CKD 3B.  Patient also reports 2 ground-level mechanical fall. Found to have hypercalcemia.   Awaiting SNF.    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  -Creatinine baseline 1.4--- 1.7 -Present with creatinine 2.2 -Continue to hold losartan and Lasix. -Strict I's and O's -Received IV fluids -Improved. Cr down to 1.4 -Elevation of CK. Trending down 599---312 Stable.   2-Generalized weakness -CT head no evidence of acute intracranial process -In the setting of AKI and dehydration, hypercalcemia -PT, OT. Recommend rehab.  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   Back pain, Moderate to severe spinal stenosis L 4-L 5 and moderate stenosis L 3 L 4  MRI: L4-L5 moderate to severe spinal canal stenosis with moderate right and mild left neural foraminal narrowing. Effacement of the lateral recesses at this level likely compresses the descending L5 nerve roots. L3-L4 moderate spinal canal stenosis and mild bilateral neural foraminal narrowing. Narrowing of the lateral recesses at this level could affect the descending L4 nerve roots. -she is  able to move LE better, she report improvement of back pain.  -Dr Franky Macho will see patient in consultation. No surgical recommendation at this time -Back pain improved with Medrol dose pack. Pain improving.   HTN; Increased  Norvasc. Continue with hydralazine.   Neuropathy  Feet pain; uric acid normal.  Started  Low dose gabapentin.  Pain better.   Hypokalemia; Replaced  Mild Hypercalcemia: -Hold calcium supplementation -Resolved with fluids.   Ground-level mechanical fall PT OT eval. Needs rehab  Ankle sprain: X ray: Mildly increased generalized soft tissue swelling. No acute osseous findings or other significant changes compared with prior studies. Brace ordered.  Swelling improved.   Elevated troponin 30 range.  Suspect related to AKI EKG no acute changes Chest pain-free  Pyuria: Urine culture. Insignificant growth   Chronic diastolic heart failure Holding Lasix in the setting of AKI and dehydration  Hyperlipidemia: Continue with Pravachol/    Paroxysmal A-fib Continue with With Eliquis Hold Coreg in the setting of bradycardia    Bradycardia Hold coreg.  Asymptomatic. Improved.   Anemia of chronic diseases:  Baseline Hb 10-12 Hb stable at 10.       Estimated body mass index is 28.53 kg/m as calculated from the following:   Height as of this encounter: 5\' 1"  (1.549 m).   Weight as of this encounter: 68.5 kg.   DVT prophylaxis: Eliquis  Code Status: Full code Family Communication:Care discussed with patient.  Disposition Plan:  Status is: Observation The patient remains OBS appropriate and will d/c before 2 midnights.    Consultants:  None  Procedures:  None  Antimicrobials:    Subjective: Back pain  better. She was able to get out bed better today, less pain.  Neuropathy pain improved.   Objective: Vitals:   10/27/22 2056 10/28/22 0603 10/28/22 0835 10/28/22 0912  BP: (!) 159/72 (!) 162/72  (!) 147/74  Pulse: 66 (!) 53  75   Resp: 18 18  20   Temp: 98.6 F (37 C) 98.6 F (37 C)  97.6 F (36.4 C)  TempSrc:      SpO2: 100% 100% 98% 100%  Weight:      Height:        Intake/Output Summary (Last 24 hours) at 10/28/2022 1333 Last data filed at 10/28/2022 1100 Gross per 24 hour  Intake 1040 ml  Output 1150 ml  Net -110 ml   Filed Weights   10/23/22 1339 10/25/22 0500 10/26/22 0500  Weight: 68.5 kg 68.5 kg 68.5 kg    Examination:  General exam: NAD Respiratory system: CTA Cardiovascular system: S 1, S 2 RRR Gastrointestinal system: BS present, soft nt Extremities: left ankle with brace    Data Reviewed: I have personally reviewed following labs and imaging studies  CBC: Recent Labs  Lab 10/23/22 1348 10/24/22 0444 10/26/22 1206  WBC 3.8* 3.4* 4.1  NEUTROABS  --  2.1  --   HGB 11.5* 10.4* 11.6*  HCT 34.6* 30.8* 35.3*  MCV 93.8 91.7 94.4  PLT 201 187 160   Basic Metabolic Panel: Recent Labs  Lab 10/23/22 1348 10/24/22 0444 10/25/22 1045 10/26/22 1206 10/28/22 1055  NA 139 142 141 139 138  K 4.3 3.1* 4.2 4.1 3.8  CL 107 108 110 106 105  CO2 24 23 23 24 25   GLUCOSE 124* 100* 91 91 123*  BUN 40* 28* 20 22 29*  CREATININE 2.27* 1.71* 1.58* 1.49* 1.73*  CALCIUM 10.7* 10.6* 9.7 9.9 10.4*  MG  --  1.8  --   --   --   PHOS  --  2.8  --   --   --    GFR: Estimated Creatinine Clearance: 21.4 mL/min (A) (by C-G formula based on SCr of 1.73 mg/dL (H)). Liver Function Tests: Recent Labs  Lab 10/24/22 0444  AST 43*  ALT 23  ALKPHOS 47  BILITOT 0.5  PROT 5.9*  ALBUMIN 3.3*   No results for input(s): "LIPASE", "AMYLASE" in the last 168 hours. No results for input(s): "AMMONIA" in the last 168 hours. Coagulation Profile: No results for input(s): "INR", "PROTIME" in the last 168 hours. Cardiac Enzymes: Recent Labs  Lab 10/24/22 0444 10/25/22 1045  CKTOTAL 599* 312*   BNP (last 3 results) No results for input(s): "PROBNP" in the last 8760 hours. HbA1C: No results for  input(s): "HGBA1C" in the last 72 hours. CBG: Recent Labs  Lab 10/23/22 1334  GLUCAP 108*   Lipid Profile: No results for input(s): "CHOL", "HDL", "LDLCALC", "TRIG", "CHOLHDL", "LDLDIRECT" in the last 72 hours. Thyroid Function Tests: No results for input(s): "TSH", "T4TOTAL", "FREET4", "T3FREE", "THYROIDAB" in the last 72 hours.  Anemia Panel: No results for input(s): "VITAMINB12", "FOLATE", "FERRITIN", "TIBC", "IRON", "RETICCTPCT" in the last 72 hours.  Sepsis Labs: No results for input(s): "PROCALCITON", "LATICACIDVEN" in the last 168 hours.  Recent Results (from the past 240 hour(s))  Urine Culture     Status: Abnormal   Collection Time: 10/23/22  2:53 PM   Specimen: Urine, Clean Catch  Result Value Ref Range Status   Specimen Description   Final    URINE, CLEAN CATCH Performed at [facility], [address]  [place], [place] [address]    Special Requests   Final    NONE Performed at [facility], [address], [place], [place] [address]    Culture (A)  Final    10,000 COLONIES/mL MULTIPLE SPECIES PRESENT, SUGGEST RECOLLECTION   Report Status 10/24/2022 FINAL  Final         Radiology Studies: No results found.      Scheduled Meds:  amLODipine  10 mg Oral Daily   apixaban  2.5 mg Oral BID   busPIRone  15 mg Oral BID   donepezil  10 mg Oral QHS   ferrous sulfate  325 mg Oral BID   gabapentin  100 mg Oral BID   hydrALAZINE  25 mg Oral TID   methylPREDNISolone  4 mg Oral 4X daily taper   mometasone-formoterol  2 puff Inhalation BID   pantoprazole  40 mg Oral Daily   pravastatin  40 mg Oral Daily   sertraline  100 mg Oral Daily   Continuous Infusions:   LOS: 4 days    Time spent: 35 minutes    Promise Bushong A Joyous Gleghorn, MD [facility]   If 7PM-7AM, please contact night-coverage [email or web address]  10/28/2022, 1:33 PM

## 2022-10-28 NOTE — Progress Notes (Signed)
Patient to self-administer CPAP using her machine and FFM from home. Machine was placed within patient's reach and checked for functionality. Patient is familiar with equipment and procedure.

## 2022-10-29 ENCOUNTER — Inpatient Hospital Stay (HOSPITAL_COMMUNITY): Payer: Medicare Other

## 2022-10-29 DIAGNOSIS — N179 Acute kidney failure, unspecified: Secondary | ICD-10-CM | POA: Diagnosis not present

## 2022-10-29 NOTE — Progress Notes (Signed)
Occupational Therapy Treatment Patient Details Name: Susan [name] MRN: [id number] DOB: [birth year] Today's Date: 10/29/2022   History of present illness Patient is a 82 year old female with generalized weakness,  AKI superimposed on CKD 3B. Two recent falls. Her CT head showed no acute intracranial abnormality.  Her CT C-spine showed no acute fracture.  Her CT lumbar spine showed no acute osseous abnormality but she did have some spinal stenosis at L4/5 and L3/4. X-ray showing L ankle sprain. History of chronic diastolic heart failure, hyperlipidemia, paroxysmal atrial fibrillation, anemia, CKD.   OT comments  Pt in good spirits, eager to participate. Pt's daughter present during session, states her mother was not able to open containers or sugar packets due to weakness. Pt assisted to EOB with CGA, cueing for hand placement and scooting to EOB. Pt assisted to toilet, mod A for STS and to recover balance. Pt max A for toileting hygiene while standing. Pt assisted to recliner 10 feet away with min A and significant increased time. Pt instructed on exercises with therband and foam block to maximize strength and improve independence with feeding and BL ADLs. Pt would benefit from continued skilled therapy to maximize strength/independence, DC plan to postacute still appropriate.       If plan is discharge home, recommend the following:  Two people to help with walking and/or transfers;A lot of help with bathing/dressing/bathroom;Assistance with cooking/housework;Assist for transportation;Help with stairs or ramp for entrance   Equipment Recommendations  Other (comment)    Recommendations for Other Services      Precautions / Restrictions Precautions Precautions: Fall Required Braces or Orthoses: Other Brace Other Brace: L ASO Restrictions Weight Bearing Restrictions: No       Mobility Bed Mobility Overal bed mobility: Needs Assistance Bed Mobility: Supine to Sit     Supine to sit:  Contact guard, HOB elevated, Used rails     General bed mobility comments: increased time for supine to sit    Transfers Overall transfer level: Needs assistance Equipment used: Rolling walker (2 wheels) Transfers: Sit to/from Stand, Bed to chair/wheelchair/BSC Sit to Stand: Mod assist, From elevated surface     Step pivot transfers: Min assist     General transfer comment: mod A for STS and mobility, has LOB posterior lean when standing.     Balance Overall balance assessment: Needs assistance Sitting-balance support: Feet supported, No upper extremity supported Sitting balance-Leahy Scale: Fair Sitting balance - Comments: EOB   Standing balance support: Single extremity supported, During functional activity Standing balance-Leahy Scale: Poor Standing balance comment: has LOB posteriorly 2X today, mod A to recover balance. Pt                           ADL either performed or assessed with clinical judgement   ADL Overall ADL's : Needs assistance/impaired Eating/Feeding: Set up;Sitting   Grooming: Set up;Sitting       Lower Body Bathing: Maximal assistance;Sit to/from stand       Lower Body Dressing: Maximal assistance;Sit to/from stand   Toilet Transfer: Ambulation;BSC/3in1;Rolling walker (2 wheels);Minimal assistance;Moderate assistance   Toileting- Clothing Manipulation and Hygiene: Maximal assistance;Sit to/from stand       Functional mobility during ADLs: Minimal assistance;Rolling walker (2 wheels) General ADL Comments: Pt max A for LB ADLs and toileting hygiene. min A for transfer    Extremity/Trunk Assessment Upper Extremity Assessment RUE Deficits / Details: numbness comes and goes, has trouble with FM skills RUE  Sensation: history of peripheral neuropathy RUE Coordination: decreased fine motor LUE Deficits / Details: Pt has significantly decreased FM skills, hx of neuropathy LUE Sensation: history of peripheral neuropathy LUE  Coordination: decreased fine motor            Vision       Perception     Praxis      Cognition Arousal: Alert Behavior During Therapy: WFL for tasks assessed/performed Overall Cognitive Status: No family/caregiver present to determine baseline cognitive functioning                                 General Comments: overall WFLs, mild decreased problem solving, decreased awareness of deficits        Exercises Exercises: Other exercises Other Exercises Other Exercises: BUE shoudler, elbow, wrist exercises using theraband, foam grip for hands.    Shoulder Instructions       General Comments      Pertinent Vitals/ Pain       Pain Assessment Pain Assessment: Faces Faces Pain Scale: Hurts a little bit Pain Location: L ankle Pain Descriptors / Indicators: Discomfort  Home Living                                          Prior Functioning/Environment              Frequency  Min 1X/week        Progress Toward Goals  OT Goals(current goals can now be found in the care plan section)  Progress towards OT goals: Progressing toward goals  Acute Rehab OT Goals Patient Stated Goal: to improve with mobility OT Goal Formulation: With patient Time For Goal Achievement: 11/07/22 Potential to Achieve Goals: Fair ADL Goals Pt Will Perform Upper Body Dressing: with set-up;sitting Pt Will Perform Lower Body Dressing: with min assist;with adaptive equipment;sitting/lateral leans Pt Will Transfer to Toilet: with min assist;stand pivot transfer;bedside commode Pt Will Perform Toileting - Clothing Manipulation and hygiene: with min assist;sitting/lateral leans Additional ADL Goal #1: Pt will be able to complete bed mobility to prepare for transfers and ADLs at bedside with mod I to maximize independence.  Plan      Co-evaluation                 AM-PAC OT "6 Clicks" Daily Activity     Outcome Measure   Help from another person  eating meals?: A Little Help from another person taking care of personal grooming?: A Little Help from another person toileting, which includes using toliet, bedpan, or urinal?: A Lot Help from another person bathing (including washing, rinsing, drying)?: A Lot Help from another person to put on and taking off regular upper body clothing?: A Lot Help from another person to put on and taking off regular lower body clothing?: A Lot 6 Click Score: 14    End of Session Equipment Utilized During Treatment: Gait belt;Rolling walker (2 wheels)  OT Visit Diagnosis: Unsteadiness on feet (R26.81);Other abnormalities of gait and mobility (R26.89);Repeated falls (R29.6);Muscle weakness (generalized) (M62.81);Pain Pain - Right/Left: Left Pain - part of body: Ankle and joints of foot   Activity Tolerance Patient tolerated treatment well   Patient Left in chair;with call bell/phone within reach;with chair alarm set;with nursing/sitter in room;with family/visitor present   Nurse Communication Mobility status  Time: 8119-1478 OT Time Calculation (min): 40 min  Charges: OT General Charges $OT Visit: 1 Visit OT Treatments $Self Care/Home Management : 23-37 mins $Therapeutic Exercise: 8-22 mins  Emileo Semel, OTR/L   Florentino Laabs R [facility] 10/29/2022, 3:21 PM

## 2022-10-29 NOTE — Progress Notes (Signed)
   10/29/22 2350  BiPAP/CPAP/SIPAP  BiPAP/CPAP/SIPAP Pt Type Adult  BiPAP/CPAP/SIPAP Resmed (pts home CPAP)

## 2022-10-29 NOTE — Progress Notes (Signed)
PROGRESS NOTE    Susan [name]  NWG:[id number] DOB: [birth year] DOA: 10/23/2022 PCP: Raymon Mutton., FNP   Brief Narrative: 82 year old with past medical history significant for chronic diastolic heart failure, hyperlipidemia, paroxysmal A-fib on Eliquis, anemia of chronic kidney disease, CKD 3B associated baseline creatinine 1.4-1.7 presents with generalized weakness, found to have AKI on CKD 3B.  Patient also reports 2 ground-level mechanical fall. Found to have hypercalcemia.   Awaiting SNF. Family to choose facility    Assessment & Plan:   Principal Problem:   Acute renal failure superimposed on stage 3b chronic kidney disease (HCC) Active Problems:   Hyperlipidemia   Anemia of chronic disease   Fall at home, initial encounter   Generalized weakness   Troponin level elevated   Hypercalcemia   Sterile pyuria   Chronic diastolic CHF (congestive heart failure) (HCC)   Paroxysmal atrial fibrillation (HCC)   AKI (acute kidney injury) (HCC)   1-AKI on CKD 3B -Suspect hypovolemia, lasix and losartan.  -Creatinine baseline 1.4--- 1.7 -Present with creatinine 2.2 -Continue to hold losartan and Lasix. -Strict I's and O's -Received IV fluids -Improved. Cr down to 1.4 -Elevation of CK. Trending down 599---312 Stable.   2-Generalized weakness -CT head no evidence of acute intracranial process -In the setting of AKI and dehydration, hypercalcemia -PT, OT. Recommend rehab.  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   Back pain, Moderate to severe spinal stenosis L 4-L 5 and moderate stenosis L 3 L 4  MRI: L4-L5 moderate to severe spinal canal stenosis with moderate right and mild left neural foraminal narrowing. Effacement of the lateral recesses at this level likely compresses the descending L5 nerve roots. L3-L4 moderate spinal canal stenosis and mild bilateral neural foraminal narrowing. Narrowing of the lateral recesses at this level could affect the descending  L4 nerve roots. -she is able to move LE better, she report improvement of back pain.  -Dr Franky Macho will see patient in consultation. No surgical recommendation at this time -Back pain improved with Medrol dose pack. Pain improving.   HTN; Increased  Norvasc. Continue with hydralazine.   Neuropathy  Feet pain; uric acid normal.  Started  Low dose gabapentin.  Pain better.   Hypokalemia; Replaced  Mild Hypercalcemia: -Hold calcium supplementation -Resolved with fluids.   Ground-level mechanical fall PT OT eval. Needs rehab  Ankle sprain: X ray: Mildly increased generalized soft tissue swelling. No acute osseous findings or other significant changes compared with prior studies. Brace ordered.  Swelling improved.  Complaint of leg pain, mid lowe leg. Will proceed with X ray Tibia, Fibula.   LE Discoloration: suspect Venous stasis.  Will get ABI. Denies claudication. This could be follow up out patient.   Elevated troponin 30 range.  Suspect related to AKI EKG no acute changes Chest pain-free  Pyuria: Urine culture. Insignificant growth   Chronic diastolic heart failure Holding Lasix in the setting of AKI and dehydration  Hyperlipidemia: Continue with Pravachol/    Paroxysmal A-fib Continue with With Eliquis Hold Coreg in the setting of bradycardia    Bradycardia Hold coreg.  Asymptomatic. Improved.   Anemia of chronic diseases:  Baseline Hb 10-12 Hb stable at 10.       Estimated body mass index is 28.53 kg/m as calculated from the following:   Height as of this encounter: 5\' 1"  (1.549 m).   Weight as of this encounter: 68.5 kg.   DVT prophylaxis: Eliquis  Code Status: Full code Family Communication:  Care discussed with patient.  Disposition Plan:  Status is: Observation The patient remains OBS appropriate and will d/c before 2 midnights. Medical stable. Family to make decision about bed.     Consultants:  None  Procedures:   None  Antimicrobials:    Subjective: She report back pain is better, it is ok if she doesn't move,.  Daughter is worry about discoloration of left LE that has been presents for months, year.  Patient report pain LE , lower tibia area.   Objective: Vitals:   10/28/22 0912 10/28/22 1646 10/28/22 2056 10/29/22 0918  BP: (!) 147/74 (!) 149/58 (!) 140/66   Pulse: 75 60 78   Resp: 20 20 16    Temp: 97.6 F (36.4 C) 98 F (36.7 C) 98.4 F (36.9 C)   TempSrc:   Oral   SpO2: 100% 100% 99% 98%  Weight:      Height:        Intake/Output Summary (Last 24 hours) at 10/29/2022 1413 Last data filed at 10/28/2022 2200 Gross per 24 hour  Intake 420 ml  Output --  Net 420 ml   Filed Weights   10/23/22 1339 10/25/22 0500 10/26/22 0500  Weight: 68.5 kg 68.5 kg 68.5 kg    Examination:  General exam: NAD Respiratory system: CTA Cardiovascular system: S 1, S 2 RRR Gastrointestinal system: BS present, soft, nt Extremities: left ankle with brace    Data Reviewed: I have personally reviewed following labs and imaging studies  CBC: Recent Labs  Lab 10/23/22 1348 10/24/22 0444 10/26/22 1206  WBC 3.8* 3.4* 4.1  NEUTROABS  --  2.1  --   HGB 11.5* 10.4* 11.6*  HCT 34.6* 30.8* 35.3*  MCV 93.8 91.7 94.4  PLT 201 187 160   Basic Metabolic Panel: Recent Labs  Lab 10/23/22 1348 10/24/22 0444 10/25/22 1045 10/26/22 1206 10/28/22 1055  NA 139 142 141 139 138  K 4.3 3.1* 4.2 4.1 3.8  CL 107 108 110 106 105  CO2 24 23 23 24 25   GLUCOSE 124* 100* 91 91 123*  BUN 40* 28* 20 22 29*  CREATININE 2.27* 1.71* 1.58* 1.49* 1.73*  CALCIUM 10.7* 10.6* 9.7 9.9 10.4*  MG  --  1.8  --   --   --   PHOS  --  2.8  --   --   --    GFR: Estimated Creatinine Clearance: 21.4 mL/min (A) (by C-G formula based on SCr of 1.73 mg/dL (H)). Liver Function Tests: Recent Labs  Lab 10/24/22 0444  AST 43*  ALT 23  ALKPHOS 47  BILITOT 0.5  PROT 5.9*  ALBUMIN 3.3*   No results for input(s):  "LIPASE", "AMYLASE" in the last 168 hours. No results for input(s): "AMMONIA" in the last 168 hours. Coagulation Profile: No results for input(s): "INR", "PROTIME" in the last 168 hours. Cardiac Enzymes: Recent Labs  Lab 10/24/22 0444 10/25/22 1045  CKTOTAL 599* 312*   BNP (last 3 results) No results for input(s): "PROBNP" in the last 8760 hours. HbA1C: No results for input(s): "HGBA1C" in the last 72 hours. CBG: Recent Labs  Lab 10/23/22 1334  GLUCAP 108*   Lipid Profile: No results for input(s): "CHOL", "HDL", "LDLCALC", "TRIG", "CHOLHDL", "LDLDIRECT" in the last 72 hours. Thyroid Function Tests: No results for input(s): "TSH", "T4TOTAL", "FREET4", "T3FREE", "THYROIDAB" in the last 72 hours.  Anemia Panel: No results for input(s): "VITAMINB12", "FOLATE", "FERRITIN", "TIBC", "IRON", "RETICCTPCT" in the last 72 hours.  Sepsis Labs: No results for  input(s): "PROCALCITON", "LATICACIDVEN" in the last 168 hours.  Recent Results (from the past 240 hour(s))  Urine Culture     Status: Abnormal   Collection Time: 10/23/22  2:53 PM   Specimen: Urine, Clean Catch  Result Value Ref Range Status   Specimen Description   Final    URINE, CLEAN CATCH Performed at [facility], [address], [place], [place] [address]    Special Requests   Final    NONE Performed at [facility], [address] [place], [place] [address]    Culture (A)  Final    10,000 COLONIES/mL MULTIPLE SPECIES PRESENT, SUGGEST RECOLLECTION   Report Status 10/24/2022 FINAL  Final         Radiology Studies: No results found.      Scheduled Meds:  amLODipine  10 mg Oral Daily   apixaban  2.5 mg Oral BID   busPIRone  15 mg Oral BID   donepezil  10 mg Oral QHS   ferrous sulfate  325 mg Oral BID   gabapentin  100 mg Oral BID   hydrALAZINE  25 mg Oral TID   methylPREDNISolone  4 mg Oral 4X daily taper   mometasone-formoterol  2 puff Inhalation BID    pantoprazole  40 mg Oral Daily   pravastatin  40 mg Oral Daily   sertraline  100 mg Oral Daily   Continuous Infusions:   LOS: 5 days    Time spent: 35 minutes    Sharlyn Odonnel A Parrie Rasco, MD [facility]   If 7PM-7AM, please contact night-coverage [email or web address]  10/29/2022, 2:13 PM

## 2022-10-29 NOTE — TOC Progression Note (Addendum)
Transition of Care [facility]) - Progression Note    Patient Details  Name: Susan [name] MRN: [id number] Date of Birth: [birth year]  Transition of Care [facility]) CM/SW Contact  Erin Sons, [place] Phone Number: 10/29/2022, 12:49 PM  Clinical Narrative:     CSW called pt's daughter for SNF choice. Daughter states they are leaning towards [facility] but decision is not final. She plans to discuss among her siblings. CSW answered questions regarding medicare coverage and auth process. CSW confirmed with [facility] that they could accept pt once insurance Berkley Harvey is received; notified daughter of this.   1245: CSW messaged daughter inquiring about SNF decision; awaiting response.   1430: Daughter notifies CSW that family is touring [facility], museum and [facility] Prior to making decisison  1630: Pt's daughter notified CSW that they are still working on touring facilities. They have toured [facility] and are going to tour [place] this evening. TOC will continue to follow for SNF choice. Berkley Harvey has been started; will need to adjust facility on auth pending facility choice.   Expected Discharge Plan: Skilled Nursing Facility Barriers to Discharge: Insurance Authorization  Expected Discharge Plan and Services       Living arrangements for the past 2 months: Single Family Home                                       Social Determinants of Health (SDOH) Interventions SDOH Screenings   Food Insecurity: No Food Insecurity (10/24/2022)  Housing: Low Risk  (10/24/2022)  Transportation Needs: No Transportation Needs (10/24/2022)  Utilities: Not At Risk (10/24/2022)  Alcohol Screen: Low Risk  (10/21/2017)  Depression (PHQ2-9): Medium Risk (07/05/2021)  Financial Resource Strain: Low Risk  (11/17/2017)  Physical Activity: Insufficiently Active (11/17/2017)  Social Connections: Unknown (05/21/2021)   Received from [facility], [facility]  Stress: Stress Concern Present (11/17/2017)  Tobacco  Use: Medium Risk (10/24/2022)    Readmission Risk Interventions     No data to display

## 2022-10-30 ENCOUNTER — Inpatient Hospital Stay (HOSPITAL_COMMUNITY): Payer: Medicare Other

## 2022-10-30 DIAGNOSIS — N170 Acute kidney failure with tubular necrosis: Secondary | ICD-10-CM

## 2022-10-30 DIAGNOSIS — M79606 Pain in leg, unspecified: Secondary | ICD-10-CM | POA: Diagnosis not present

## 2022-10-30 DIAGNOSIS — N1832 Chronic kidney disease, stage 3b: Secondary | ICD-10-CM | POA: Diagnosis not present

## 2022-10-30 LAB — CBC
HCT: 32.6 % — ABNORMAL LOW (ref 36.0–46.0)
Hemoglobin: 10.5 g/dL — ABNORMAL LOW (ref 12.0–15.0)
MCH: 31 pg (ref 26.0–34.0)
MCHC: 32.2 g/dL (ref 30.0–36.0)
MCV: 96.2 fL (ref 80.0–100.0)
Platelets: 215 10*3/uL (ref 150–400)
RBC: 3.39 MIL/uL — ABNORMAL LOW (ref 3.87–5.11)
RDW: 13.1 % (ref 11.5–15.5)
WBC: 5.6 10*3/uL (ref 4.0–10.5)
nRBC: 0 % (ref 0.0–0.2)

## 2022-10-30 LAB — BASIC METABOLIC PANEL
Anion gap: 8 (ref 5–15)
BUN: 40 mg/dL — ABNORMAL HIGH (ref 8–23)
CO2: 23 mmol/L (ref 22–32)
Calcium: 9.8 mg/dL (ref 8.9–10.3)
Chloride: 106 mmol/L (ref 98–111)
Creatinine, Ser: 1.74 mg/dL — ABNORMAL HIGH (ref 0.44–1.00)
GFR, Estimated: 29 mL/min — ABNORMAL LOW (ref >60)
Glucose, Bld: 100 mg/dL — ABNORMAL HIGH (ref 70–99)
Potassium: 4.3 mmol/L (ref 3.5–5.1)
Sodium: 137 mmol/L (ref 135–145)

## 2022-10-30 LAB — MAGNESIUM: Magnesium: 2.1 mg/dL (ref 1.7–2.4)

## 2022-10-30 LAB — VAS US ABI WITH/WO TBI
Left ABI: 1.1
Right ABI: 0.85

## 2022-10-30 MED ORDER — GUAIFENESIN 100 MG/5ML PO LIQD: 5.0000 mL | ORAL | Status: DC | PRN

## 2022-10-30 MED ORDER — METOPROLOL TARTRATE 5 MG/5ML IV SOLN: 5.0000 mg | INTRAVENOUS | Status: DC | PRN

## 2022-10-30 MED ORDER — HYDRALAZINE HCL 20 MG/ML IJ SOLN: 10.0000 mg | INTRAMUSCULAR | Status: DC | PRN

## 2022-10-30 MED ORDER — AMLODIPINE BESYLATE 10 MG PO TABS: 10.0000 mg | ORAL_TABLET | Freq: Every day | ORAL | Status: DC

## 2022-10-30 MED ORDER — PANTOPRAZOLE SODIUM 40 MG PO TBEC: 40.0000 mg | DELAYED_RELEASE_TABLET | Freq: Every day | ORAL | Status: DC

## 2022-10-30 MED ORDER — SENNOSIDES-DOCUSATE SODIUM 8.6-50 MG PO TABS: 1.0000 | ORAL_TABLET | Freq: Every evening | ORAL | Status: DC | PRN

## 2022-10-30 MED ORDER — TRAZODONE HCL 50 MG PO TABS: 50.0000 mg | ORAL_TABLET | Freq: Every evening | ORAL | Status: DC | PRN

## 2022-10-30 MED ORDER — METHYLPREDNISOLONE 4 MG PO TBPK: ORAL_TABLET | ORAL | Status: AC

## 2022-10-30 MED ORDER — GABAPENTIN 100 MG PO CAPS: 100.0000 mg | ORAL_CAPSULE | Freq: Two times a day (BID) | ORAL | Status: DC

## 2022-10-30 MED ORDER — IPRATROPIUM-ALBUTEROL 0.5-2.5 (3) MG/3ML IN SOLN: 3.0000 mL | RESPIRATORY_TRACT | Status: DC | PRN

## 2022-10-30 MED ORDER — ONDANSETRON HCL 4 MG/2ML IJ SOLN: 4.0000 mg | Freq: Four times a day (QID) | INTRAMUSCULAR | Status: DC | PRN

## 2022-10-30 NOTE — TOC Transition Note (Signed)
Transition of Care [facility]) - CM/SW Discharge Note   Patient Details  Name: Susan [name] MRN: [id number] Date of Birth: [birth year]  Transition of Care [facility]) CM/SW Contact:  Erin Sons, LCSW Phone Number: 10/30/2022, 11:04 AM   Clinical Narrative:     Daughter notified CSW that family and pt have decided on [facility].  Auth approved [id number] and [facility] can admit today.    Per MD patient ready for DC to [facility] healthcare. RN, patient, patient's family, and facility notified of DC. Discharge Summary and FL2 sent to facility. RN to call report prior to discharge [phone]). DC packet on chart. Ambulance transport requested for patient.   CSW will sign off for now as social work intervention is no longer needed. Please consult [place] again if new needs arise.   Final next level of care: Skilled Nursing Facility Barriers to Discharge: No Barriers Identified   Patient Goals and CMS Choice      Discharge Placement                Patient chooses bed at: [facility] Patient to be transferred to facility by: [facility] Name of family member notified: Daughter [name] Patient and family notified of of transfer: 10/30/22  Discharge Plan and Services Additional resources added to the After Visit Summary for                                       Social Determinants of Health (SDOH) Interventions SDOH Screenings   Food Insecurity: No Food Insecurity (10/24/2022)  Housing: Low Risk  (10/24/2022)  Transportation Needs: No Transportation Needs (10/24/2022)  Utilities: Not At Risk (10/24/2022)  Alcohol Screen: Low Risk  (10/21/2017)  Depression (PHQ2-9): Medium Risk (07/05/2021)  Financial Resource Strain: Low Risk  (11/17/2017)  Physical Activity: Insufficiently Active (11/17/2017)  Social Connections: Unknown (05/21/2021)   Received from [facility], [facility]  Stress: Stress Concern Present (11/17/2017)  Tobacco Use: Medium  Risk (10/24/2022)     Readmission Risk Interventions     No data to display

## 2022-10-30 NOTE — Hospital Course (Addendum)
  Brief Narrative: 82 year old with past medical history significant for chronic diastolic heart failure, hyperlipidemia, paroxysmal A-fib on Eliquis, anemia of chronic kidney disease, CKD 3B associated baseline creatinine 1.4-1.7 presents with generalized weakness, found to have AKI on CKD 3B.  Patient also reports 2 ground-level mechanical fall. Found to have hypercalcemia.  This was treated with IV fluids, renal function returned back to baseline.  Eventually PT OT recommended SNF therefore arrangements being made by [facility] team.   SNF placement.    Assessment & Plan:   Principal Problem:   Acute renal failure superimposed on stage 3b chronic kidney disease (HCC) Active Problems:   Hyperlipidemia   Anemia of chronic disease   Fall at home, initial encounter   Generalized weakness   Troponin level elevated   Hypercalcemia   Sterile pyuria   Chronic diastolic CHF (congestive heart failure) (HCC)   Paroxysmal atrial fibrillation (HCC)   AKI (acute kidney injury) (HCC)     AKI on CKD 3B Baseline creatinine 1.7, admission creatinine 2.2.  Improvedwith IV fluids. Repeat BMP in next 2-3 days   Generalized weakness With ground-level fall CT head negative.  TSH normal, no signs of infection.  PT/OT recommending rehab -In the setting of AKI and dehydration, hypercalcemia -PT, OT. Recommend rehab.  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    Back pain, Moderate to severe spinal stenosis L 4-L 5 and moderate stenosis L 3 L 4  MRI shows severe lumbar stenosis with significant arthropathy.  Seen by Dr. Franky Macho from neurosurgery, no surgical intervention anticipated at this time.  Recommend outpatient follow-up.   HTN Norvasc 10 mg daily. Dc Coreg for now. Can be started when HR improves   Neuropathy  Started low-dose gabapentin   Hypokalemia/mild hypercalcemia -Replete and monitor as needed     Ankle sprain  X ray: Mild generalized soft tissue swelling.  Brace ordered.   RICE   LE Discoloration suspect Venous stasis.  Denies any claudication.  ABI ordered by previous provider, shows chronic changes. Needs to follow up with PCP but at this time doesn't seek anu aggressive intervention.    Pyuria  Urine culture. Insignificant growth    Chronic diastolic heart failure Holding Lasix in the setting of AKI and dehydration. This can be resumed in the future once her PO intake improves and her Cr remains stable.    Hyperlipidemia Statin    Paroxysmal A-fib On Eliquis.  Coreg on hold due to some bradycardia  Bradycardia Asymptomatic.  Coreg on hold   Anemia of chronic diseases:  Baseline Hb 10-12 Hb stable at 10.    Estimated body mass index is 28.53 kg/m as calculated from the following:   Height as of this encounter: 5\' 1"  (1.549 m).   Weight as of this encounter: 68.5 kg.     DVT prophylaxis: Eliquis  Code Status: Full code Family Communication: [name] updated.  Disposition Plan:  SNF placement.

## 2022-10-30 NOTE — Plan of Care (Signed)

## 2022-10-30 NOTE — Progress Notes (Signed)
Discharge orders received. AVS printed and placed in discharge packet. IV removed. Report given to [facility] at [facility]. Patient transported via [facility].

## 2022-10-30 NOTE — Discharge Summary (Addendum)
Physician Discharge Summary  Susan [name]:[id number] DOB: 02/17/1940 DOA: 10/23/2022  PCP: Susan Mutton., FNP  Admit date: 10/23/2022 Discharge date: 10/30/2022  Admitted From: Home Disposition:  SNF  Recommendations for Outpatient Follow-up:  Follow up with PCP in 1-2 weeks Please obtain BMP/CBC in one week your next doctors visit.  DC Coreg and lasix. Resume Coreg when appropriate Started Gabapentin and Norvasc.  Outpatient NeuroSx follow up 4 more days of PO Medrol pak taper.    Discharge Condition: Stable CODE STATUS: Full Diet recommendation: Cardiac     Brief Narrative: 82 year old with past medical history significant for chronic diastolic heart failure, hyperlipidemia, paroxysmal A-fib on Eliquis, anemia of chronic kidney disease, CKD 3B associated baseline creatinine 1.4-1.7 presents with generalized weakness, found to have AKI on CKD 3B.  Patient also reports 2 ground-level mechanical fall. Found to have hypercalcemia.  This was treated with IV fluids, renal function returned back to baseline.  Eventually PT OT recommended SNF therefore arrangements being made by [facility] team.   SNF placement.    Assessment & Plan:   Principal Problem:   Acute renal failure superimposed on stage 3b chronic kidney disease (HCC) Active Problems:   Hyperlipidemia   Anemia of chronic disease   Fall at home, initial encounter   Generalized weakness   Troponin level elevated   Hypercalcemia   Sterile pyuria   Chronic diastolic CHF (congestive heart failure) (HCC)   Paroxysmal atrial fibrillation (HCC)   AKI (acute kidney injury) (HCC)     AKI on CKD 3B Baseline creatinine 1.7, admission creatinine 2.2.  Improvedwith IV fluids. Repeat BMP in next 2-3 days   Generalized weakness With ground-level fall CT head negative.  TSH normal, no signs of infection.  PT/OT recommending rehab -In the setting of AKI and dehydration, hypercalcemia -PT, OT. Recommend rehab.  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    Back pain, Moderate to severe spinal stenosis L 4-L 5 and moderate stenosis L 3 L 4  MRI shows severe lumbar stenosis with significant arthropathy.  Seen by Dr. Franky [name] from neurosurgery, no surgical intervention anticipated at this time.  Recommend outpatient follow-up.   HTN Norvasc 10 mg daily. Dc Coreg for now. Can be started when HR improves   Neuropathy  Started low-dose gabapentin   Hypokalemia/mild hypercalcemia -Replete and monitor as needed     Ankle sprain  X ray: Mild generalized soft tissue swelling.  Brace ordered.  RICE   LE Discoloration suspect Venous stasis.  Denies any claudication.  ABI ordered by previous provider, shows chronic changes. Needs to follow up with PCP but at this time doesn't seek anu aggressive intervention.    Pyuria  Urine culture. Insignificant growth    Chronic diastolic heart failure Holding Lasix in the setting of AKI and dehydration. This can be resumed in the future once her PO intake improves and her Cr remains stable.    Hyperlipidemia Statin    Paroxysmal A-fib On Eliquis.  Coreg on hold due to some bradycardia  Bradycardia Asymptomatic.  Coreg on hold   Anemia of chronic diseases:  Baseline Hb 10-12 Hb stable at 10.    Estimated body mass index is 28.53 kg/m as calculated from the following:   Height as of this encounter: 5\' 1"  (1.549 m).   Weight as of this encounter: 68.5 kg.     DVT prophylaxis: Eliquis  Code Status: Full code Family Communication: Susan [name] updated.  Disposition Plan:  SNF  up, please get all Hospital records sent to your Prim MD by signing hospital release before you go home.  Get CBC, CMP, 2 view Chest X ray checked  by Primary MD during your next visit or SNF MD in 5-7 days ( we routinely change or add medications that can affect your baseline labs and fluid status, therefore we recommend that you get the mentioned basic workup next visit with your PCP, your PCP may decide not to get them or add new tests based on their clinical decision)  On your next visit with your primary care physician please Get Medicines reviewed and adjusted.  If you experience worsening of your admission symptoms, develop shortness of breath, life threatening emergency, suicidal or homicidal thoughts you must seek medical attention immediately by calling 911 or calling your MD immediately  if symptoms less severe.  You Must read complete instructions/literature along with all the possible adverse reactions/side effects for all the Medicines you take and that have been prescribed to you. Take any new Medicines after you have completely understood and accpet all the possible adverse reactions/side effects.   Do not drive, operate heavy machinery, perform activities at heights, swimming or participation in water activities or provide baby sitting services if your were admitted for syncope or siezures until you have seen by Primary MD or a Neurologist and advised to do so again.  Do not drive when taking Pain medications.   Procedures/Studies: VAS [place] ABI WITH/WO TBI  Result Date: 10/30/2022  LOWER EXTREMITY DOPPLER STUDY Patient Name:  Susan [name]  Date of Exam:   10/30/2022 Medical Rec #: [id number]     Accession #:    [id number] Date of Birth: October 26, 1940     Patient Gender: F Patient Age:   35 years Exam Location:  [facility] Procedure:      VAS [place] ABI WITH/WO TBI  Referring Phys: Susan [name] --------------------------------------------------------------------------------  Indications: Rest pain. High Risk Factors: Hypertension, hyperlipidemia.  Comparison Study: No prior studies. Performing Technologist: Olen Cordial RVT  Examination Guidelines: A complete evaluation includes at minimum, Doppler waveform signals and systolic blood pressure reading at the level of bilateral brachial, anterior tibial, and posterior tibial arteries, when vessel segments are accessible. Bilateral testing is considered an integral part of a complete examination. Photoelectric Plethysmograph (PPG) waveforms and toe systolic pressure readings are included as required and additional duplex testing as needed. Limited examinations for reoccurring indications may be performed as noted.  ABI Findings: +---------+------------------+-----+-----------+--------+ Right    Rt Pressure (mmHg)IndexWaveform   Comment  +---------+------------------+-----+-----------+--------+ Brachial 149                    triphasic           +---------+------------------+-----+-----------+--------+ PTA      133               0.85 monophasic          +---------+------------------+-----+-----------+--------+ DP       131               0.83 multiphasic         +---------+------------------+-----+-----------+--------+ Great Toe30                0.19                     +---------+------------------+-----+-----------+--------+ +---------+------------------+-----+-----------+-------+ Left     Lt Pressure (mmHg)IndexWaveform   Comment +---------+------------------+-----+-----------+-------+ Brachial 157  Physician Discharge Summary  Susan [name]:[id number] DOB: 02/17/1940 DOA: 10/23/2022  PCP: Susan Mutton., FNP  Admit date: 10/23/2022 Discharge date: 10/30/2022  Admitted From: Home Disposition:  SNF  Recommendations for Outpatient Follow-up:  Follow up with PCP in 1-2 weeks Please obtain BMP/CBC in one week your next doctors visit.  DC Coreg and lasix. Resume Coreg when appropriate Started Gabapentin and Norvasc.  Outpatient NeuroSx follow up 4 more days of PO Medrol pak taper.    Discharge Condition: Stable CODE STATUS: Full Diet recommendation: Cardiac     Brief Narrative: 82 year old with past medical history significant for chronic diastolic heart failure, hyperlipidemia, paroxysmal A-fib on Eliquis, anemia of chronic kidney disease, CKD 3B associated baseline creatinine 1.4-1.7 presents with generalized weakness, found to have AKI on CKD 3B.  Patient also reports 2 ground-level mechanical fall. Found to have hypercalcemia.  This was treated with IV fluids, renal function returned back to baseline.  Eventually PT OT recommended SNF therefore arrangements being made by [facility] team.   SNF placement.    Assessment & Plan:   Principal Problem:   Acute renal failure superimposed on stage 3b chronic kidney disease (HCC) Active Problems:   Hyperlipidemia   Anemia of chronic disease   Fall at home, initial encounter   Generalized weakness   Troponin level elevated   Hypercalcemia   Sterile pyuria   Chronic diastolic CHF (congestive heart failure) (HCC)   Paroxysmal atrial fibrillation (HCC)   AKI (acute kidney injury) (HCC)     AKI on CKD 3B Baseline creatinine 1.7, admission creatinine 2.2.  Improvedwith IV fluids. Repeat BMP in next 2-3 days   Generalized weakness With ground-level fall CT head negative.  TSH normal, no signs of infection.  PT/OT recommending rehab -In the setting of AKI and dehydration, hypercalcemia -PT, OT. Recommend rehab.  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    Back pain, Moderate to severe spinal stenosis L 4-L 5 and moderate stenosis L 3 L 4  MRI shows severe lumbar stenosis with significant arthropathy.  Seen by Dr. Franky [name] from neurosurgery, no surgical intervention anticipated at this time.  Recommend outpatient follow-up.   HTN Norvasc 10 mg daily. Dc Coreg for now. Can be started when HR improves   Neuropathy  Started low-dose gabapentin   Hypokalemia/mild hypercalcemia -Replete and monitor as needed     Ankle sprain  X ray: Mild generalized soft tissue swelling.  Brace ordered.  RICE   LE Discoloration suspect Venous stasis.  Denies any claudication.  ABI ordered by previous provider, shows chronic changes. Needs to follow up with PCP but at this time doesn't seek anu aggressive intervention.    Pyuria  Urine culture. Insignificant growth    Chronic diastolic heart failure Holding Lasix in the setting of AKI and dehydration. This can be resumed in the future once her PO intake improves and her Cr remains stable.    Hyperlipidemia Statin    Paroxysmal A-fib On Eliquis.  Coreg on hold due to some bradycardia  Bradycardia Asymptomatic.  Coreg on hold   Anemia of chronic diseases:  Baseline Hb 10-12 Hb stable at 10.    Estimated body mass index is 28.53 kg/m as calculated from the following:   Height as of this encounter: 5\' 1"  (1.549 m).   Weight as of this encounter: 68.5 kg.     DVT prophylaxis: Eliquis  Code Status: Full code Family Communication: Susan [name] updated.  Disposition Plan:  SNF  up, please get all Hospital records sent to your Prim MD by signing hospital release before you go home.  Get CBC, CMP, 2 view Chest X ray checked  by Primary MD during your next visit or SNF MD in 5-7 days ( we routinely change or add medications that can affect your baseline labs and fluid status, therefore we recommend that you get the mentioned basic workup next visit with your PCP, your PCP may decide not to get them or add new tests based on their clinical decision)  On your next visit with your primary care physician please Get Medicines reviewed and adjusted.  If you experience worsening of your admission symptoms, develop shortness of breath, life threatening emergency, suicidal or homicidal thoughts you must seek medical attention immediately by calling 911 or calling your MD immediately  if symptoms less severe.  You Must read complete instructions/literature along with all the possible adverse reactions/side effects for all the Medicines you take and that have been prescribed to you. Take any new Medicines after you have completely understood and accpet all the possible adverse reactions/side effects.   Do not drive, operate heavy machinery, perform activities at heights, swimming or participation in water activities or provide baby sitting services if your were admitted for syncope or siezures until you have seen by Primary MD or a Neurologist and advised to do so again.  Do not drive when taking Pain medications.   Procedures/Studies: VAS [place] ABI WITH/WO TBI  Result Date: 10/30/2022  LOWER EXTREMITY DOPPLER STUDY Patient Name:  Susan [name]  Date of Exam:   10/30/2022 Medical Rec #: [id number]     Accession #:    [id number] Date of Birth: October 26, 1940     Patient Gender: F Patient Age:   35 years Exam Location:  [facility] Procedure:      VAS [place] ABI WITH/WO TBI  Referring Phys: Susan [name] --------------------------------------------------------------------------------  Indications: Rest pain. High Risk Factors: Hypertension, hyperlipidemia.  Comparison Study: No prior studies. Performing Technologist: Olen Cordial RVT  Examination Guidelines: A complete evaluation includes at minimum, Doppler waveform signals and systolic blood pressure reading at the level of bilateral brachial, anterior tibial, and posterior tibial arteries, when vessel segments are accessible. Bilateral testing is considered an integral part of a complete examination. Photoelectric Plethysmograph (PPG) waveforms and toe systolic pressure readings are included as required and additional duplex testing as needed. Limited examinations for reoccurring indications may be performed as noted.  ABI Findings: +---------+------------------+-----+-----------+--------+ Right    Rt Pressure (mmHg)IndexWaveform   Comment  +---------+------------------+-----+-----------+--------+ Brachial 149                    triphasic           +---------+------------------+-----+-----------+--------+ PTA      133               0.85 monophasic          +---------+------------------+-----+-----------+--------+ DP       131               0.83 multiphasic         +---------+------------------+-----+-----------+--------+ Great Toe30                0.19                     +---------+------------------+-----+-----------+--------+ +---------+------------------+-----+-----------+-------+ Left     Lt Pressure (mmHg)IndexWaveform   Comment +---------+------------------+-----+-----------+-------+ Brachial 157  Physician Discharge Summary  Susan [name]:[id number] DOB: 02/17/1940 DOA: 10/23/2022  PCP: Susan Mutton., FNP  Admit date: 10/23/2022 Discharge date: 10/30/2022  Admitted From: Home Disposition:  SNF  Recommendations for Outpatient Follow-up:  Follow up with PCP in 1-2 weeks Please obtain BMP/CBC in one week your next doctors visit.  DC Coreg and lasix. Resume Coreg when appropriate Started Gabapentin and Norvasc.  Outpatient NeuroSx follow up 4 more days of PO Medrol pak taper.    Discharge Condition: Stable CODE STATUS: Full Diet recommendation: Cardiac     Brief Narrative: 82 year old with past medical history significant for chronic diastolic heart failure, hyperlipidemia, paroxysmal A-fib on Eliquis, anemia of chronic kidney disease, CKD 3B associated baseline creatinine 1.4-1.7 presents with generalized weakness, found to have AKI on CKD 3B.  Patient also reports 2 ground-level mechanical fall. Found to have hypercalcemia.  This was treated with IV fluids, renal function returned back to baseline.  Eventually PT OT recommended SNF therefore arrangements being made by [facility] team.   SNF placement.    Assessment & Plan:   Principal Problem:   Acute renal failure superimposed on stage 3b chronic kidney disease (HCC) Active Problems:   Hyperlipidemia   Anemia of chronic disease   Fall at home, initial encounter   Generalized weakness   Troponin level elevated   Hypercalcemia   Sterile pyuria   Chronic diastolic CHF (congestive heart failure) (HCC)   Paroxysmal atrial fibrillation (HCC)   AKI (acute kidney injury) (HCC)     AKI on CKD 3B Baseline creatinine 1.7, admission creatinine 2.2.  Improvedwith IV fluids. Repeat BMP in next 2-3 days   Generalized weakness With ground-level fall CT head negative.  TSH normal, no signs of infection.  PT/OT recommending rehab -In the setting of AKI and dehydration, hypercalcemia -PT, OT. Recommend rehab.  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    Back pain, Moderate to severe spinal stenosis L 4-L 5 and moderate stenosis L 3 L 4  MRI shows severe lumbar stenosis with significant arthropathy.  Seen by Dr. Franky [name] from neurosurgery, no surgical intervention anticipated at this time.  Recommend outpatient follow-up.   HTN Norvasc 10 mg daily. Dc Coreg for now. Can be started when HR improves   Neuropathy  Started low-dose gabapentin   Hypokalemia/mild hypercalcemia -Replete and monitor as needed     Ankle sprain  X ray: Mild generalized soft tissue swelling.  Brace ordered.  RICE   LE Discoloration suspect Venous stasis.  Denies any claudication.  ABI ordered by previous provider, shows chronic changes. Needs to follow up with PCP but at this time doesn't seek anu aggressive intervention.    Pyuria  Urine culture. Insignificant growth    Chronic diastolic heart failure Holding Lasix in the setting of AKI and dehydration. This can be resumed in the future once her PO intake improves and her Cr remains stable.    Hyperlipidemia Statin    Paroxysmal A-fib On Eliquis.  Coreg on hold due to some bradycardia  Bradycardia Asymptomatic.  Coreg on hold   Anemia of chronic diseases:  Baseline Hb 10-12 Hb stable at 10.    Estimated body mass index is 28.53 kg/m as calculated from the following:   Height as of this encounter: 5\' 1"  (1.549 m).   Weight as of this encounter: 68.5 kg.     DVT prophylaxis: Eliquis  Code Status: Full code Family Communication: Susan [name] updated.  Disposition Plan:  SNF  up, please get all Hospital records sent to your Prim MD by signing hospital release before you go home.  Get CBC, CMP, 2 view Chest X ray checked  by Primary MD during your next visit or SNF MD in 5-7 days ( we routinely change or add medications that can affect your baseline labs and fluid status, therefore we recommend that you get the mentioned basic workup next visit with your PCP, your PCP may decide not to get them or add new tests based on their clinical decision)  On your next visit with your primary care physician please Get Medicines reviewed and adjusted.  If you experience worsening of your admission symptoms, develop shortness of breath, life threatening emergency, suicidal or homicidal thoughts you must seek medical attention immediately by calling 911 or calling your MD immediately  if symptoms less severe.  You Must read complete instructions/literature along with all the possible adverse reactions/side effects for all the Medicines you take and that have been prescribed to you. Take any new Medicines after you have completely understood and accpet all the possible adverse reactions/side effects.   Do not drive, operate heavy machinery, perform activities at heights, swimming or participation in water activities or provide baby sitting services if your were admitted for syncope or siezures until you have seen by Primary MD or a Neurologist and advised to do so again.  Do not drive when taking Pain medications.   Procedures/Studies: VAS [place] ABI WITH/WO TBI  Result Date: 10/30/2022  LOWER EXTREMITY DOPPLER STUDY Patient Name:  Susan [name]  Date of Exam:   10/30/2022 Medical Rec #: [id number]     Accession #:    [id number] Date of Birth: October 26, 1940     Patient Gender: F Patient Age:   35 years Exam Location:  [facility] Procedure:      VAS [place] ABI WITH/WO TBI  Referring Phys: Susan [name] --------------------------------------------------------------------------------  Indications: Rest pain. High Risk Factors: Hypertension, hyperlipidemia.  Comparison Study: No prior studies. Performing Technologist: Olen Cordial RVT  Examination Guidelines: A complete evaluation includes at minimum, Doppler waveform signals and systolic blood pressure reading at the level of bilateral brachial, anterior tibial, and posterior tibial arteries, when vessel segments are accessible. Bilateral testing is considered an integral part of a complete examination. Photoelectric Plethysmograph (PPG) waveforms and toe systolic pressure readings are included as required and additional duplex testing as needed. Limited examinations for reoccurring indications may be performed as noted.  ABI Findings: +---------+------------------+-----+-----------+--------+ Right    Rt Pressure (mmHg)IndexWaveform   Comment  +---------+------------------+-----+-----------+--------+ Brachial 149                    triphasic           +---------+------------------+-----+-----------+--------+ PTA      133               0.85 monophasic          +---------+------------------+-----+-----------+--------+ DP       131               0.83 multiphasic         +---------+------------------+-----+-----------+--------+ Great Toe30                0.19                     +---------+------------------+-----+-----------+--------+ +---------+------------------+-----+-----------+-------+ Left     Lt Pressure (mmHg)IndexWaveform   Comment +---------+------------------+-----+-----------+-------+ Brachial 157  Physician Discharge Summary  Susan [name]:[id number] DOB: 02/17/1940 DOA: 10/23/2022  PCP: Susan Mutton., FNP  Admit date: 10/23/2022 Discharge date: 10/30/2022  Admitted From: Home Disposition:  SNF  Recommendations for Outpatient Follow-up:  Follow up with PCP in 1-2 weeks Please obtain BMP/CBC in one week your next doctors visit.  DC Coreg and lasix. Resume Coreg when appropriate Started Gabapentin and Norvasc.  Outpatient NeuroSx follow up 4 more days of PO Medrol pak taper.    Discharge Condition: Stable CODE STATUS: Full Diet recommendation: Cardiac     Brief Narrative: 82 year old with past medical history significant for chronic diastolic heart failure, hyperlipidemia, paroxysmal A-fib on Eliquis, anemia of chronic kidney disease, CKD 3B associated baseline creatinine 1.4-1.7 presents with generalized weakness, found to have AKI on CKD 3B.  Patient also reports 2 ground-level mechanical fall. Found to have hypercalcemia.  This was treated with IV fluids, renal function returned back to baseline.  Eventually PT OT recommended SNF therefore arrangements being made by [facility] team.   SNF placement.    Assessment & Plan:   Principal Problem:   Acute renal failure superimposed on stage 3b chronic kidney disease (HCC) Active Problems:   Hyperlipidemia   Anemia of chronic disease   Fall at home, initial encounter   Generalized weakness   Troponin level elevated   Hypercalcemia   Sterile pyuria   Chronic diastolic CHF (congestive heart failure) (HCC)   Paroxysmal atrial fibrillation (HCC)   AKI (acute kidney injury) (HCC)     AKI on CKD 3B Baseline creatinine 1.7, admission creatinine 2.2.  Improvedwith IV fluids. Repeat BMP in next 2-3 days   Generalized weakness With ground-level fall CT head negative.  TSH normal, no signs of infection.  PT/OT recommending rehab -In the setting of AKI and dehydration, hypercalcemia -PT, OT. Recommend rehab.  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    Back pain, Moderate to severe spinal stenosis L 4-L 5 and moderate stenosis L 3 L 4  MRI shows severe lumbar stenosis with significant arthropathy.  Seen by Dr. Franky [name] from neurosurgery, no surgical intervention anticipated at this time.  Recommend outpatient follow-up.   HTN Norvasc 10 mg daily. Dc Coreg for now. Can be started when HR improves   Neuropathy  Started low-dose gabapentin   Hypokalemia/mild hypercalcemia -Replete and monitor as needed     Ankle sprain  X ray: Mild generalized soft tissue swelling.  Brace ordered.  RICE   LE Discoloration suspect Venous stasis.  Denies any claudication.  ABI ordered by previous provider, shows chronic changes. Needs to follow up with PCP but at this time doesn't seek anu aggressive intervention.    Pyuria  Urine culture. Insignificant growth    Chronic diastolic heart failure Holding Lasix in the setting of AKI and dehydration. This can be resumed in the future once her PO intake improves and her Cr remains stable.    Hyperlipidemia Statin    Paroxysmal A-fib On Eliquis.  Coreg on hold due to some bradycardia  Bradycardia Asymptomatic.  Coreg on hold   Anemia of chronic diseases:  Baseline Hb 10-12 Hb stable at 10.    Estimated body mass index is 28.53 kg/m as calculated from the following:   Height as of this encounter: 5\' 1"  (1.549 m).   Weight as of this encounter: 68.5 kg.     DVT prophylaxis: Eliquis  Code Status: Full code Family Communication: Susan [name] updated.  Disposition Plan:  SNF  up, please get all Hospital records sent to your Prim MD by signing hospital release before you go home.  Get CBC, CMP, 2 view Chest X ray checked  by Primary MD during your next visit or SNF MD in 5-7 days ( we routinely change or add medications that can affect your baseline labs and fluid status, therefore we recommend that you get the mentioned basic workup next visit with your PCP, your PCP may decide not to get them or add new tests based on their clinical decision)  On your next visit with your primary care physician please Get Medicines reviewed and adjusted.  If you experience worsening of your admission symptoms, develop shortness of breath, life threatening emergency, suicidal or homicidal thoughts you must seek medical attention immediately by calling 911 or calling your MD immediately  if symptoms less severe.  You Must read complete instructions/literature along with all the possible adverse reactions/side effects for all the Medicines you take and that have been prescribed to you. Take any new Medicines after you have completely understood and accpet all the possible adverse reactions/side effects.   Do not drive, operate heavy machinery, perform activities at heights, swimming or participation in water activities or provide baby sitting services if your were admitted for syncope or siezures until you have seen by Primary MD or a Neurologist and advised to do so again.  Do not drive when taking Pain medications.   Procedures/Studies: VAS [place] ABI WITH/WO TBI  Result Date: 10/30/2022  LOWER EXTREMITY DOPPLER STUDY Patient Name:  Susan [name]  Date of Exam:   10/30/2022 Medical Rec #: [id number]     Accession #:    [id number] Date of Birth: October 26, 1940     Patient Gender: F Patient Age:   35 years Exam Location:  [facility] Procedure:      VAS [place] ABI WITH/WO TBI  Referring Phys: Susan [name] --------------------------------------------------------------------------------  Indications: Rest pain. High Risk Factors: Hypertension, hyperlipidemia.  Comparison Study: No prior studies. Performing Technologist: Olen Cordial RVT  Examination Guidelines: A complete evaluation includes at minimum, Doppler waveform signals and systolic blood pressure reading at the level of bilateral brachial, anterior tibial, and posterior tibial arteries, when vessel segments are accessible. Bilateral testing is considered an integral part of a complete examination. Photoelectric Plethysmograph (PPG) waveforms and toe systolic pressure readings are included as required and additional duplex testing as needed. Limited examinations for reoccurring indications may be performed as noted.  ABI Findings: +---------+------------------+-----+-----------+--------+ Right    Rt Pressure (mmHg)IndexWaveform   Comment  +---------+------------------+-----+-----------+--------+ Brachial 149                    triphasic           +---------+------------------+-----+-----------+--------+ PTA      133               0.85 monophasic          +---------+------------------+-----+-----------+--------+ DP       131               0.83 multiphasic         +---------+------------------+-----+-----------+--------+ Great Toe30                0.19                     +---------+------------------+-----+-----------+--------+ +---------+------------------+-----+-----------+-------+ Left     Lt Pressure (mmHg)IndexWaveform   Comment +---------+------------------+-----+-----------+-------+ Brachial 157  up, please get all Hospital records sent to your Prim MD by signing hospital release before you go home.  Get CBC, CMP, 2 view Chest X ray checked  by Primary MD during your next visit or SNF MD in 5-7 days ( we routinely change or add medications that can affect your baseline labs and fluid status, therefore we recommend that you get the mentioned basic workup next visit with your PCP, your PCP may decide not to get them or add new tests based on their clinical decision)  On your next visit with your primary care physician please Get Medicines reviewed and adjusted.  If you experience worsening of your admission symptoms, develop shortness of breath, life threatening emergency, suicidal or homicidal thoughts you must seek medical attention immediately by calling 911 or calling your MD immediately  if symptoms less severe.  You Must read complete instructions/literature along with all the possible adverse reactions/side effects for all the Medicines you take and that have been prescribed to you. Take any new Medicines after you have completely understood and accpet all the possible adverse reactions/side effects.   Do not drive, operate heavy machinery, perform activities at heights, swimming or participation in water activities or provide baby sitting services if your were admitted for syncope or siezures until you have seen by Primary MD or a Neurologist and advised to do so again.  Do not drive when taking Pain medications.   Procedures/Studies: VAS [place] ABI WITH/WO TBI  Result Date: 10/30/2022  LOWER EXTREMITY DOPPLER STUDY Patient Name:  Susan [name]  Date of Exam:   10/30/2022 Medical Rec #: [id number]     Accession #:    [id number] Date of Birth: October 26, 1940     Patient Gender: F Patient Age:   35 years Exam Location:  [facility] Procedure:      VAS [place] ABI WITH/WO TBI  Referring Phys: Susan [name] --------------------------------------------------------------------------------  Indications: Rest pain. High Risk Factors: Hypertension, hyperlipidemia.  Comparison Study: No prior studies. Performing Technologist: Olen Cordial RVT  Examination Guidelines: A complete evaluation includes at minimum, Doppler waveform signals and systolic blood pressure reading at the level of bilateral brachial, anterior tibial, and posterior tibial arteries, when vessel segments are accessible. Bilateral testing is considered an integral part of a complete examination. Photoelectric Plethysmograph (PPG) waveforms and toe systolic pressure readings are included as required and additional duplex testing as needed. Limited examinations for reoccurring indications may be performed as noted.  ABI Findings: +---------+------------------+-----+-----------+--------+ Right    Rt Pressure (mmHg)IndexWaveform   Comment  +---------+------------------+-----+-----------+--------+ Brachial 149                    triphasic           +---------+------------------+-----+-----------+--------+ PTA      133               0.85 monophasic          +---------+------------------+-----+-----------+--------+ DP       131               0.83 multiphasic         +---------+------------------+-----+-----------+--------+ Great Toe30                0.19                     +---------+------------------+-----+-----------+--------+ +---------+------------------+-----+-----------+-------+ Left     Lt Pressure (mmHg)IndexWaveform   Comment +---------+------------------+-----+-----------+-------+ Brachial 157

## 2022-10-30 NOTE — Progress Notes (Signed)
ABI's have been completed. Preliminary results can be found in CV Proc through chart review.   10/30/22 12:21 PM Olen Cordial RVT

## 2022-10-30 NOTE — Progress Notes (Signed)
PT Cancellation Note  Patient Details Name: Susan [name] MRN: [id number] DOB: 1940-12-04   Cancelled Treatment:    Reason Eval/Treat Not Completed: (P) Patient at procedure or test/unavailable (Pt undergoing BLE ABI from vascular team.) Per chart review, plan for pt DC to SNF post-procedure, will continue efforts per PT Plan of Care if able prior to DC.   Dorathy Kinsman Dom Haverland 10/30/2022, 11:50 AM

## 2022-11-15 ENCOUNTER — Other Ambulatory Visit: Payer: Self-pay | Admitting: Nurse Practitioner

## 2022-11-15 DIAGNOSIS — K117 Disturbances of salivary secretion: Secondary | ICD-10-CM

## 2022-11-15 DIAGNOSIS — R531 Weakness: Secondary | ICD-10-CM

## 2022-11-15 DIAGNOSIS — R410 Disorientation, unspecified: Secondary | ICD-10-CM

## 2022-11-21 ENCOUNTER — Other Ambulatory Visit: Payer: Self-pay

## 2022-11-21 ENCOUNTER — Emergency Department (HOSPITAL_BASED_OUTPATIENT_CLINIC_OR_DEPARTMENT_OTHER): Payer: Medicare Other

## 2022-11-21 ENCOUNTER — Encounter (HOSPITAL_BASED_OUTPATIENT_CLINIC_OR_DEPARTMENT_OTHER): Payer: Self-pay | Admitting: Emergency Medicine

## 2022-11-21 ENCOUNTER — Emergency Department (HOSPITAL_BASED_OUTPATIENT_CLINIC_OR_DEPARTMENT_OTHER)
Admission: EM | Admit: 2022-11-21 | Discharge: 2022-11-21 | Disposition: A | Discharge status: Home / Self Care | Payer: Medicare Other | Attending: Emergency Medicine | Admitting: Emergency Medicine

## 2022-11-21 DIAGNOSIS — Z7901 Long term (current) use of anticoagulants: Secondary | ICD-10-CM | POA: Diagnosis not present

## 2022-11-21 DIAGNOSIS — R479 Unspecified speech disturbances: Secondary | ICD-10-CM | POA: Insufficient documentation

## 2022-11-21 DIAGNOSIS — Z91040 Latex allergy status: Secondary | ICD-10-CM | POA: Insufficient documentation

## 2022-11-21 DIAGNOSIS — R5383 Other fatigue: Secondary | ICD-10-CM | POA: Insufficient documentation

## 2022-11-21 DIAGNOSIS — R4701 Aphasia: Secondary | ICD-10-CM | POA: Diagnosis present

## 2022-11-21 LAB — URINALYSIS, ROUTINE W REFLEX MICROSCOPIC
Bacteria, UA: NONE SEEN
Bilirubin Urine: NEGATIVE
Glucose, UA: NEGATIVE mg/dL
Hgb urine dipstick: NEGATIVE
Ketones, ur: NEGATIVE mg/dL
Leukocytes,Ua: NEGATIVE
Nitrite: NEGATIVE
Specific Gravity, Urine: 1.022 (ref 1.005–1.030)
pH: 5.5 (ref 5.0–8.0)

## 2022-11-21 LAB — I-STAT VENOUS BLOOD GAS, ED
Acid-Base Excess: 2 mmol/L (ref 0.0–2.0)
Bicarbonate: 26.6 mmol/L (ref 20.0–28.0)
Calcium, Ion: 1.36 mmol/L (ref 1.15–1.40)
HCT: 35 % — ABNORMAL LOW (ref 36.0–46.0)
Hemoglobin: 11.9 g/dL — ABNORMAL LOW (ref 12.0–15.0)
O2 Saturation: 57 %
Potassium: 3.7 mmol/L (ref 3.5–5.1)
Sodium: 143 mmol/L (ref 135–145)
TCO2: 28 mmol/L (ref 22–32)
pCO2, Ven: 41.2 mm[Hg] — ABNORMAL LOW (ref 44–60)
pH, Ven: 7.417 (ref 7.25–7.43)
pO2, Ven: 29 mm[Hg] — CL (ref 32–45)

## 2022-11-21 LAB — COMPREHENSIVE METABOLIC PANEL
ALT: 12 U/L (ref 0–44)
AST: 17 U/L (ref 15–41)
Albumin: 4 g/dL (ref 3.5–5.0)
Alkaline Phosphatase: 62 U/L (ref 38–126)
Anion gap: 8 (ref 5–15)
BUN: 24 mg/dL — ABNORMAL HIGH (ref 8–23)
CO2: 28 mmol/L (ref 22–32)
Calcium: 10.9 mg/dL — ABNORMAL HIGH (ref 8.9–10.3)
Chloride: 107 mmol/L (ref 98–111)
Creatinine, Ser: 1.48 mg/dL — ABNORMAL HIGH (ref 0.44–1.00)
GFR, Estimated: 35 mL/min — ABNORMAL LOW (ref >60)
Glucose, Bld: 100 mg/dL — ABNORMAL HIGH (ref 70–99)
Potassium: 3.6 mmol/L (ref 3.5–5.1)
Sodium: 143 mmol/L (ref 135–145)
Total Bilirubin: 0.4 mg/dL (ref <1.2)
Total Protein: 7.5 g/dL (ref 6.5–8.1)

## 2022-11-21 LAB — CBC WITH DIFFERENTIAL/PLATELET
Abs Immature Granulocytes: 0.01 10*3/uL (ref 0.00–0.07)
Basophils Absolute: 0 10*3/uL (ref 0.0–0.1)
Basophils Relative: 1 %
Eosinophils Absolute: 0 10*3/uL (ref 0.0–0.5)
Eosinophils Relative: 1 %
HCT: 34.1 % — ABNORMAL LOW (ref 36.0–46.0)
Hemoglobin: 11.2 g/dL — ABNORMAL LOW (ref 12.0–15.0)
Immature Granulocytes: 0 %
Lymphocytes Relative: 19 %
Lymphs Abs: 0.6 10*3/uL — ABNORMAL LOW (ref 0.7–4.0)
MCH: 31.4 pg (ref 26.0–34.0)
MCHC: 32.8 g/dL (ref 30.0–36.0)
MCV: 95.5 fL (ref 80.0–100.0)
Monocytes Absolute: 0.4 10*3/uL (ref 0.1–1.0)
Monocytes Relative: 13 %
Neutro Abs: 2.2 10*3/uL (ref 1.7–7.7)
Neutrophils Relative %: 66 %
Platelets: 231 10*3/uL (ref 150–400)
RBC: 3.57 MIL/uL — ABNORMAL LOW (ref 3.87–5.11)
RDW: 12.8 % (ref 11.5–15.5)
WBC: 3.3 10*3/uL — ABNORMAL LOW (ref 4.0–10.5)
nRBC: 0 % (ref 0.0–0.2)

## 2022-11-21 LAB — LACTIC ACID, PLASMA: Lactic Acid, Venous: 0.7 mmol/L (ref 0.5–1.9)

## 2022-11-21 LAB — CBG MONITORING, ED: Glucose-Capillary: 87 mg/dL (ref 70–99)

## 2022-11-21 LAB — TSH: TSH: 1.211 u[IU]/mL (ref 0.350–4.500)

## 2022-11-21 MED ORDER — SODIUM CHLORIDE 0.9 % IV BOLUS
1000.0000 mL | Freq: Once | INTRAVENOUS | Status: AC
  Administered 2022-11-21: 1000 mL via INTRAVENOUS

## 2022-11-21 MED ORDER — HALOPERIDOL LACTATE 5 MG/ML IJ SOLN
1.0000 mg | Freq: Once | INTRAMUSCULAR | Status: AC
  Administered 2022-11-21: 1 mg via INTRAVENOUS
  Filled 2022-11-21: qty 1

## 2022-11-21 NOTE — ED Notes (Signed)
RT Note: VBG completed and handed to Dr. Adela Lank

## 2022-11-21 NOTE — ED Notes (Signed)
ED Provider at bedside. 

## 2022-11-21 NOTE — ED Triage Notes (Signed)
Pt bib wheelchair, report by granddaughter. Reports drooling and slurred speech x 3-4 days. Pt reports symptoms have been ongoing for longer time. Pt in rehab r/t fall. Pt aox4, speech clear

## 2022-11-21 NOTE — Discharge Instructions (Signed)
Luckily I did not find an obvious cause for your symptoms.  This does not mean that nothing is wrong.  Please discuss this with your family doctor.  They may want to reassess your medications.  Please return for worsening or change

## 2022-11-21 NOTE — ED Notes (Signed)
Episode of incontinence

## 2022-11-21 NOTE — ED Provider Notes (Signed)
[place] EMERGENCY DEPARTMENT AT [facility] Provider Note   CSN: [id number] Arrival date & time: 11/21/22  1544     History  Chief Complaint  Patient presents with   Aphasia    Susan [name] is a 82 y.o. female.  82 yo F with a chief complaints of difficulty swallowing with slowing of her speech and tremors to both of her hands.  It is hard to get the patient or family to tell me how long it has been going on.  It seems to have started at some point since she has been discharged from the hospital and she has been in a skilled nursing facility for a couple weeks.  Had blood work obtained on Monday that reportedly was normal.  She was worried because things were not improving and they had called their primary care provider to encouraged them to come here for evaluation.  She has a chronic cough that she does not think is changed.  She stopped gabapentin upon discharge from the hospital but does not know of any other medication changes.  Has been urinating more frequently though said that her UA done on Monday was normal.        Home Medications Prior to Admission medications   Medication Sig Start Date End Date Taking? Authorizing Provider  amLODipine (NORVASC) 10 MG tablet Take 1 tablet (10 mg total) by mouth daily. 10/31/22   Amin, Ankit C, MD  ARIPiprazole (ABILIFY) 10 MG tablet Take 10 mg by mouth daily. 10/14/22   [provider]  buPROPion (WELLBUTRIN XL) 150 MG 24 hr tablet Take 150 mg by mouth daily. 10/14/22   [provider]  busPIRone (BUSPAR) 15 MG tablet Take 15 mg by mouth 2 (two) times daily.    [provider]  Calcium Carb-Cholecalciferol 600-10 MG-MCG TABS Take 1 tablet by mouth 2 (two) times daily. 06/20/21   [provider]  cetirizine (ZYRTEC) 10 MG tablet Take 10 mg by mouth daily. 06/20/21   [provider]  diclofenac sodium (VOLTAREN) 1 % GEL Apply 2 g topically 4 (four) times daily. 05/25/18   Sharon Seller, NP  donepezil (ARICEPT) 10 MG tablet Take 1 tablet (10 mg total) by mouth at bedtime. For memory loss 05/25/18   Sharon Seller, NP  ELIQUIS 2.5 MG TABS tablet TAKE 1 TABLET BY MOUTH 2 TIMES DAILY 01/04/22   Tereso Newcomer T, PA-C  FEROSUL 325 (65 Fe) MG tablet Take 325 mg by mouth 2 (two) times daily. 06/21/21   [provider]  gabapentin (NEURONTIN) 100 MG capsule Take 1 capsule (100 mg total) by mouth 2 (two) times daily. 10/30/22   Amin, Ankit C, MD  hydrALAZINE (APRESOLINE) 25 MG tablet TAKE 1 TABLET BY MOUTH 3 TIMES DAILY Patient taking differently: Take 25 mg by mouth 3 (three) times daily. 06/17/18   Sharon Seller, NP  losartan (COZAAR) 50 MG tablet TAKE 1 TABLET BY MOUTH EVERY DAY Patient taking differently: Take 50 mg by mouth daily. 08/07/18   Sharon Seller, NP  Menthol, Topical Analgesic, (BIOFREEZE) 4 % GEL Apply 3 oz topically 3 (three) times daily. For knee pain Patient taking differently: Apply 1 application  topically 3 (three) times daily as needed (knee pain). 09/25/17   Ngetich, Donalee Citrin, NP  Multiple Vitamin (GNP ESSENTIAL ONE DAILY) TABS TAKE 1 TABLET BY MOUTH EVERY DAY Patient taking differently: Take 1 tablet by mouth daily. 06/17/18   Sharon Seller, NP  pantoprazole (PROTONIX) 40 MG tablet Take 1 tablet (40 mg total) by mouth daily before breakfast. 10/30/22   Amin, Ankit C, MD  pravastatin (PRAVACHOL) 40 MG tablet TAKE 1 TABLET BY MOUTH EVERY DAY Patient taking differently: Take 40 mg by mouth daily. 06/17/18   Sharon Seller, NP  sertraline (ZOLOFT) 100 MG tablet Take 100 mg by mouth daily.    [provider]  SYMBICORT 80-4.5 MCG/ACT inhaler Inhale 2 puffs into the lungs. 02/08/21   [provider]      Allergies    Ace inhibitors, Lipitor [atorvastatin], Simvastatin, and Latex    Review of Systems   Review of Systems  Physical Exam Updated Vital Signs BP (!) 168/133   Pulse 61   Temp 98.2 F (36.8 C)    Resp 19   Wt 74.8 kg   SpO2 99%   BMI 31.18 kg/m  Physical Exam Vitals and nursing note reviewed.  Constitutional:      General: She is not in acute distress.    Appearance: She is well-developed. She is not diaphoretic.  HENT:     Head: Normocephalic and atraumatic.  Eyes:     Pupils: Pupils are equal, round, and reactive to light.  Cardiovascular:     Rate and Rhythm: Normal rate and regular rhythm.     Heart sounds: No murmur heard.    No friction rub. No gallop.  Pulmonary:     Effort: Pulmonary effort is normal.     Breath sounds: No wheezing or rales.  Abdominal:     General: There is no distension.     Palpations: Abdomen is soft.     Tenderness: There is no abdominal tenderness.  Musculoskeletal:        General: No tenderness.     Cervical back: Normal range of motion and neck supple.  Skin:    General: Skin is warm and dry.  Neurological:     Mental Status: She is alert and oriented to person, place, and time.     Cranial Nerves: Cranial nerves 2-12 are intact.     Sensory: Sensation is intact.     Motor: Motor function is intact.     Coordination: Coordination is intact.     Comments: Inconsistent exam, patient seems globally weak.  Slow speech that seems to improve when she wants to get her point across  Psychiatric:        Behavior: Behavior normal.     ED Results / Procedures / Treatments   Labs (all labs ordered are listed, but only abnormal results are displayed) Labs Reviewed  COMPREHENSIVE METABOLIC PANEL - Abnormal; Notable for the following components:      Result Value   Glucose, Bld 100 (*)    BUN 24 (*)    Creatinine, Ser 1.48 (*)    Calcium 10.9 (*)    GFR, Estimated 35 (*)    All other components within normal limits  CBC WITH DIFFERENTIAL/PLATELET - Abnormal; Notable for the following components:   WBC 3.3 (*)    RBC 3.57 (*)    Hemoglobin 11.2 (*)    HCT 34.1 (*)    Lymphs Abs 0.6 (*)    All other components within normal limits   URINALYSIS, ROUTINE W REFLEX MICROSCOPIC - Abnormal; Notable for the following components:   Protein, ur TRACE (*)    All other components within normal limits  I-STAT VENOUS BLOOD GAS, ED - Abnormal; Notable for the following components:   pCO2,  Ven 41.2 (*)    pO2, Ven 29 (*)    HCT 35.0 (*)    Hemoglobin 11.9 (*)    All other components within normal limits  LACTIC ACID, PLASMA  TSH  AMMONIA  BLOOD GAS, VENOUS  CBG MONITORING, ED    EKG None  Radiology DG Chest Port 1 View  Result Date: 11/21/2022 CLINICAL DATA:  Slurred speech. EXAM: PORTABLE CHEST 1 VIEW COMPARISON:  October 23, 2022 FINDINGS: The cardiac silhouette is moderately enlarged and unchanged in size. Marked severity calcification of the aortic arch is noted. There is mild, stable prominence of the central pulmonary vasculature. Mild, diffuse, chronic appearing increased interstitial lung markings are seen. There is no evidence of focal consolidation, pleural effusion or pneumothorax. Multilevel degenerative changes are seen throughout the thoracic spine. IMPRESSION: Stable cardiomegaly with mild pulmonary vascular congestion. Electronically Signed   By: Aram Candela M.D.   On: 11/21/2022 19:42   MR BRAIN WO CONTRAST  Result Date: 11/21/2022 CLINICAL DATA:  Initial evaluation for neuro deficit, stroke suspected. EXAM: MRI HEAD WITHOUT CONTRAST TECHNIQUE: Multiplanar, multiecho pulse sequences of the brain and surrounding structures were obtained without intravenous contrast. COMPARISON:  Prior CT from earlier the same day. FINDINGS: Brain: Diffuse prominence of the CSF containing spaces compatible generalized age-related cerebral atrophy. Patchy T2/FLAIR hyperintensity involving the periventricular deep white matter, consistent with chronic small vessel ischemic disease, mild for age. No evidence for acute or subacute ischemia. Gray-white matter differentiation maintained. No acute or chronic intracranial blood  products. No mass lesion, midline shift or mass effect. Ventricular prominence related to global parenchymal volume loss without hydrocephalus. No extra-axial fluid collection. Pituitary gland suprasellar region within normal limits. Vascular: Major intracranial vascular flow voids are maintained. Skull and upper cervical spine: Degenerative osteoarthritic changes noted about the C1-2 articulations. Bone marrow signal intensity diffusely heterogeneous without visible focal marrow replacing lesion. No scalp soft tissue abnormality. Sinuses/Orbits: Prior bilateral ocular lens replacement. Paranasal sinuses are largely clear. No mastoid effusion. Other: None. IMPRESSION: 1. No acute intracranial abnormality. 2. Age-related cerebral atrophy with mild chronic small vessel ischemic disease. Electronically Signed   By: Rise Mu M.D.   On: 11/21/2022 19:06   CT HEAD WO CONTRAST  Result Date: 11/21/2022 CLINICAL DATA:  Mental status change, persistent or worsening EXAM: CT HEAD WITHOUT CONTRAST TECHNIQUE: Contiguous axial images were obtained from the base of the skull through the vertex without intravenous contrast. RADIATION DOSE REDUCTION: This exam was performed according to the departmental dose-optimization program which includes automated exposure control, adjustment of the mA and/or kV according to patient size and/or use of iterative reconstruction technique. COMPARISON:  CT head October 23, 2022. FINDINGS: Brain: No evidence of acute infarction, hemorrhage, hydrocephalus, extra-axial collection or mass lesion/mass effect. Vascular: No hyperdense vessel or unexpected calcification. Skull: No acute fracture. Sinuses/Orbits: Clear sinuses.  No acute orbital findings. Other: No mastoid effusions. IMPRESSION: No evidence of acute intracranial abnormality. Electronically Signed   By: Feliberto Harts M.D.   On: 11/21/2022 17:10    Procedures Procedures    Medications Ordered in ED Medications   sodium chloride 0.9 % bolus 1,000 mL (0 mLs Intravenous Stopped 11/21/22 1930)  haloperidol lactate (HALDOL) injection 1 mg (1 mg Intravenous Given 11/21/22 1749)    ED Course/ Medical Decision Making/ A&P  Medical Decision Making Amount and/or Complexity of Data Reviewed Labs: ordered. Radiology: ordered.  Risk Prescription drug management.   82 yo F with a chief complaints of drooling eye watering heavy eyelids slow speech tremors to both of her hands.  It sounds like this been going on for at least a week.  Family was concerned maybe she had a stroke.  Did call their primary care provider who encouraged them to come here.    I will start with a laboratory evaluation and CT of the head.  It seems less likely to be a stroke based on my exam.  I would be more concerned about a medication reaction or perhaps an electrolyte abnormality.  She does clinically look dehydrated as well.  The patient does not hypercarbic, no significant electrolyte abnormalities, no acute anemia.  Lactate is normal.  TSH is normal.  Chest x-ray independently interpreted by me without focal infiltrate or pneumothorax.  CT of the head without obvious intracranial hemorrhage on my independent interpretation.  Family concerned about a stroke.  MRI brain.  MRI is negative for acute intracranial pathology.  Discussed results with patient and family.  Will discharge back to her facility.  8:12 PM:  I have discussed the diagnosis/risks/treatment options with the patient and family.  Evaluation and diagnostic testing in the emergency department does not suggest an emergent condition requiring admission or immediate intervention beyond what has been performed at this time.  They will follow up with PCP. We also discussed returning to the ED immediately if new or worsening sx occur. We discussed the sx which are most concerning (e.g., sudden worsening pain, fever, inability to tolerate by  mouth) that necessitate immediate return. Medications administered to the patient during their visit and any new prescriptions provided to the patient are listed below.  Medications given during this visit Medications  sodium chloride 0.9 % bolus 1,000 mL (0 mLs Intravenous Stopped 11/21/22 1930)  haloperidol lactate (HALDOL) injection 1 mg (1 mg Intravenous Given 11/21/22 1749)     The patient appears reasonably screen and/or stabilized for discharge and I doubt any other medical condition or other [facility] requiring further screening, evaluation, or treatment in the ED at this time prior to discharge.          Final Clinical Impression(s) / ED Diagnoses Final diagnoses:  Fatigue, unspecified type  Difficulty with speech    Rx / DC Orders ED Discharge Orders     None         Melene Plan, DO 11/21/22 2012

## 2022-11-21 NOTE — ED Notes (Signed)
Reviewed AVS/discharge instruction with patient. Time allotted for and all questions answered. Patient is agreeable for d/c and escorted to ed exit by staff.  

## 2022-12-16 ENCOUNTER — Other Ambulatory Visit (HOSPITAL_COMMUNITY): Payer: Self-pay | Admitting: *Deleted

## 2022-12-16 DIAGNOSIS — R1310 Dysphagia, unspecified: Secondary | ICD-10-CM

## 2022-12-17 ENCOUNTER — Other Ambulatory Visit: Payer: Medicare Other

## 2023-01-07 ENCOUNTER — Ambulatory Visit (HOSPITAL_COMMUNITY)
Admission: RE | Admit: 2023-01-07 | Discharge: 2023-01-07 | Disposition: A | Discharge status: Home / Self Care | Payer: Medicare Other | Source: Ambulatory Visit | Attending: Family | Admitting: Family

## 2023-01-07 DIAGNOSIS — F0150 Vascular dementia without behavioral disturbance: Secondary | ICD-10-CM | POA: Diagnosis not present

## 2023-01-07 DIAGNOSIS — I5032 Chronic diastolic (congestive) heart failure: Secondary | ICD-10-CM | POA: Diagnosis not present

## 2023-01-07 DIAGNOSIS — I110 Hypertensive heart disease with heart failure: Secondary | ICD-10-CM | POA: Diagnosis not present

## 2023-01-07 DIAGNOSIS — G4733 Obstructive sleep apnea (adult) (pediatric): Secondary | ICD-10-CM | POA: Diagnosis not present

## 2023-01-07 DIAGNOSIS — R1312 Dysphagia, oropharyngeal phase: Secondary | ICD-10-CM | POA: Insufficient documentation

## 2023-01-07 DIAGNOSIS — R1310 Dysphagia, unspecified: Secondary | ICD-10-CM

## 2023-01-07 DIAGNOSIS — Z8673 Personal history of transient ischemic attack (TIA), and cerebral infarction without residual deficits: Secondary | ICD-10-CM | POA: Diagnosis not present

## 2023-01-07 NOTE — Therapy (Signed)
 Modified Barium Swallow Study  Patient Details  Name: Susan [name] MRN: [id number] Date of Birth: December 17, 1940  Today's Date: 01/07/2023  Modified Barium Swallow completed.  Full report located under Chart Review in the Imaging Section.  History of Present Illness Rosalina Dingwall is an 82 y.o. female with PMH: dementia, CHF, CVA with dysarthria, GERD, MDD, OSA on CPAP. She presented to this OP MBS with her daughter who provided history. Daughter reported that patient seems to take longer with chewing up foods, has to have medications crushed up and mixed with honey and has some coughing with PO intake.   Clinical Impression Odell Fasching presents with a mild oropharyngeal dysphagia as per this modified barium swallow study. SLP suspects cognition impacts her swallow function as well, leading to swallow initiation delays and prolonged mastication. Anterior hyoid excursion, laryngeal elevation and laryngeal vestibular closure were all partial in completion. Swallow was initiated at level of pyriform sinus for thin, nectar thick, honey thick liquids and delayed at level of vallecular sinus with puree mechanical soft solids. No penetration or aspiration observed and only trace to no residuals in pyriform sinus s/p initial swallows. Mastication was prolonged and patient with reduced oral containment of bolus. PES opening and transit was [facility] and no retrograde flow of barium observed. 13 mm barium tablet was tolerated with puree solids and sips of nectar thick liquids to help fully transit. SLP recommended and discussed with patient's daughter, to focus on foods that are not tough, crunchy, chewy, hard, dry. Daughter already cuts patient's food up. SLP advised to start trying medications whole, one at a time, in a puree/pudding consistency solid. No further skilled SLP intervention or assessment warranted at this time. Factors that may increase risk of adverse event in presence of aspiration Noe & Lianne 2021):  Reduced cognitive function  Swallow Evaluation Recommendations Recommendations: PO diet PO Diet Recommendation: Dysphagia 3 (Mechanical soft);Thin liquids (Level 0) Liquid Administration via: Cup;Straw Medication Administration: Whole meds with puree Swallowing strategies  : Minimize environmental distractions;Slow rate;Small bites/sips Postural changes: Position pt fully upright for meals    Norleen IVAR Blase, MA, CCC-SLP Speech Therapy

## 2023-01-08 ENCOUNTER — Emergency Department (HOSPITAL_COMMUNITY): Payer: Medicare Other

## 2023-01-08 ENCOUNTER — Emergency Department (HOSPITAL_COMMUNITY)
Admission: EM | Admit: 2023-01-08 | Discharge: 2023-01-16 | Disposition: A | Discharge status: Home / Self Care | Payer: Medicare Other | Attending: Emergency Medicine | Admitting: Emergency Medicine

## 2023-01-08 ENCOUNTER — Other Ambulatory Visit: Payer: Self-pay

## 2023-01-08 DIAGNOSIS — Z8673 Personal history of transient ischemic attack (TIA), and cerebral infarction without residual deficits: Secondary | ICD-10-CM | POA: Insufficient documentation

## 2023-01-08 DIAGNOSIS — Z7901 Long term (current) use of anticoagulants: Secondary | ICD-10-CM | POA: Insufficient documentation

## 2023-01-08 DIAGNOSIS — Z79899 Other long term (current) drug therapy: Secondary | ICD-10-CM | POA: Insufficient documentation

## 2023-01-08 DIAGNOSIS — R071 Chest pain on breathing: Secondary | ICD-10-CM | POA: Insufficient documentation

## 2023-01-08 DIAGNOSIS — F0390 Unspecified dementia without behavioral disturbance: Secondary | ICD-10-CM | POA: Insufficient documentation

## 2023-01-08 DIAGNOSIS — N183 Chronic kidney disease, stage 3 unspecified: Secondary | ICD-10-CM | POA: Diagnosis not present

## 2023-01-08 DIAGNOSIS — I130 Hypertensive heart and chronic kidney disease with heart failure and stage 1 through stage 4 chronic kidney disease, or unspecified chronic kidney disease: Secondary | ICD-10-CM | POA: Insufficient documentation

## 2023-01-08 DIAGNOSIS — L97518 Non-pressure chronic ulcer of other part of right foot with other specified severity: Secondary | ICD-10-CM | POA: Diagnosis not present

## 2023-01-08 DIAGNOSIS — I5032 Chronic diastolic (congestive) heart failure: Secondary | ICD-10-CM | POA: Diagnosis not present

## 2023-01-08 DIAGNOSIS — R531 Weakness: Secondary | ICD-10-CM | POA: Diagnosis present

## 2023-01-08 DIAGNOSIS — Z91040 Latex allergy status: Secondary | ICD-10-CM | POA: Diagnosis not present

## 2023-01-08 DIAGNOSIS — Z20822 Contact with and (suspected) exposure to covid-19: Secondary | ICD-10-CM | POA: Insufficient documentation

## 2023-01-08 LAB — URINALYSIS, W/ REFLEX TO CULTURE (INFECTION SUSPECTED)
Bilirubin Urine: NEGATIVE
Glucose, UA: NEGATIVE mg/dL
Hgb urine dipstick: NEGATIVE
Ketones, ur: NEGATIVE mg/dL
Leukocytes,Ua: NEGATIVE
Nitrite: NEGATIVE
Protein, ur: NEGATIVE mg/dL
Specific Gravity, Urine: 1.011 (ref 1.005–1.030)
pH: 5 (ref 5.0–8.0)

## 2023-01-08 LAB — COMPREHENSIVE METABOLIC PANEL
ALT: 18 U/L (ref 0–44)
AST: 24 U/L (ref 15–41)
Albumin: 3.6 g/dL (ref 3.5–5.0)
Alkaline Phosphatase: 70 U/L (ref 38–126)
Anion gap: 7 (ref 5–15)
BUN: 27 mg/dL — ABNORMAL HIGH (ref 8–23)
CO2: 25 mmol/L (ref 22–32)
Calcium: 9.8 mg/dL (ref 8.9–10.3)
Chloride: 106 mmol/L (ref 98–111)
Creatinine, Ser: 1.65 mg/dL — ABNORMAL HIGH (ref 0.44–1.00)
GFR, Estimated: 30 mL/min — ABNORMAL LOW (ref >60)
Glucose, Bld: 86 mg/dL (ref 70–99)
Potassium: 3.3 mmol/L — ABNORMAL LOW (ref 3.5–5.1)
Sodium: 138 mmol/L (ref 135–145)
Total Bilirubin: 0.3 mg/dL (ref 0.0–1.2)
Total Protein: 7 g/dL (ref 6.5–8.1)

## 2023-01-08 LAB — CBC WITH DIFFERENTIAL/PLATELET
Abs Immature Granulocytes: 0.02 10*3/uL (ref 0.00–0.07)
Basophils Absolute: 0 10*3/uL (ref 0.0–0.1)
Basophils Relative: 1 %
Eosinophils Absolute: 0.1 10*3/uL (ref 0.0–0.5)
Eosinophils Relative: 2 %
HCT: 35.4 % — ABNORMAL LOW (ref 36.0–46.0)
Hemoglobin: 11.4 g/dL — ABNORMAL LOW (ref 12.0–15.0)
Immature Granulocytes: 0 %
Lymphocytes Relative: 17 %
Lymphs Abs: 0.9 10*3/uL (ref 0.7–4.0)
MCH: 31.6 pg (ref 26.0–34.0)
MCHC: 32.2 g/dL (ref 30.0–36.0)
MCV: 98.1 fL (ref 80.0–100.0)
Monocytes Absolute: 0.6 10*3/uL (ref 0.1–1.0)
Monocytes Relative: 12 %
Neutro Abs: 3.3 10*3/uL (ref 1.7–7.7)
Neutrophils Relative %: 68 %
Platelets: 258 10*3/uL (ref 150–400)
RBC: 3.61 MIL/uL — ABNORMAL LOW (ref 3.87–5.11)
RDW: 13.3 % (ref 11.5–15.5)
WBC: 4.9 10*3/uL (ref 4.0–10.5)
nRBC: 0 % (ref 0.0–0.2)

## 2023-01-08 LAB — I-STAT CG4 LACTIC ACID, ED
Lactic Acid, Venous: 1.3 mmol/L (ref 0.5–1.9)
Lactic Acid, Venous: 1 mmol/L (ref 0.5–1.9)

## 2023-01-08 LAB — RESP PANEL BY RT-PCR (RSV, FLU A&B, COVID)  RVPGX2
Influenza A by PCR: NEGATIVE
Influenza B by PCR: NEGATIVE
Resp Syncytial Virus by PCR: NEGATIVE
SARS Coronavirus 2 by RT PCR: NEGATIVE

## 2023-01-08 LAB — BRAIN NATRIURETIC PEPTIDE: B Natriuretic Peptide: 126 pg/mL — ABNORMAL HIGH (ref 0.0–100.0)

## 2023-01-08 LAB — TROPONIN I (HIGH SENSITIVITY): Troponin I (High Sensitivity): 12 ng/L (ref <18)

## 2023-01-08 MED ORDER — HYDRALAZINE HCL 25 MG PO TABS
25.0000 mg | ORAL_TABLET | Freq: Three times a day (TID) | ORAL | Status: DC
  Administered 2023-01-08 – 2023-01-16 (×24): 25 mg via ORAL
  Filled 2023-01-08 (×22): qty 1

## 2023-01-08 MED ORDER — AMLODIPINE BESYLATE 5 MG PO TABS
10.0000 mg | ORAL_TABLET | Freq: Every day | ORAL | Status: DC
  Administered 2023-01-10 – 2023-01-16 (×7): 10 mg via ORAL
  Filled 2023-01-08 (×8): qty 2

## 2023-01-08 MED ORDER — DONEPEZIL HCL 5 MG PO TABS
10.0000 mg | ORAL_TABLET | Freq: Every day | ORAL | Status: DC
  Administered 2023-01-08 – 2023-01-15 (×8): 10 mg via ORAL
  Filled 2023-01-08 (×8): qty 2

## 2023-01-08 MED ORDER — PRAVASTATIN SODIUM 20 MG PO TABS
40.0000 mg | ORAL_TABLET | Freq: Every day | ORAL | Status: DC
  Administered 2023-01-09 – 2023-01-16 (×8): 40 mg via ORAL
  Filled 2023-01-08 (×8): qty 2

## 2023-01-08 MED ORDER — MOMETASONE FURO-FORMOTEROL FUM 100-5 MCG/ACT IN AERO
2.0000 | INHALATION_SPRAY | Freq: Two times a day (BID) | RESPIRATORY_TRACT | Status: DC
  Administered 2023-01-10 – 2023-01-16 (×13): 2 via RESPIRATORY_TRACT
  Filled 2023-01-08 (×3): qty 8.8

## 2023-01-08 MED ORDER — BUPROPION HCL ER (XL) 150 MG PO TB24
150.0000 mg | ORAL_TABLET | Freq: Every day | ORAL | Status: DC
  Administered 2023-01-09 – 2023-01-16 (×8): 150 mg via ORAL
  Filled 2023-01-08 (×8): qty 1

## 2023-01-08 MED ORDER — SERTRALINE HCL 50 MG PO TABS
100.0000 mg | ORAL_TABLET | Freq: Every day | ORAL | Status: DC
  Administered 2023-01-09 – 2023-01-16 (×8): 100 mg via ORAL
  Filled 2023-01-08 (×8): qty 2

## 2023-01-08 MED ORDER — PANTOPRAZOLE SODIUM 40 MG PO TBEC
40.0000 mg | DELAYED_RELEASE_TABLET | Freq: Every day | ORAL | Status: DC
  Administered 2023-01-09 – 2023-01-16 (×8): 40 mg via ORAL
  Filled 2023-01-08 (×7): qty 1

## 2023-01-08 MED ORDER — LORATADINE 10 MG PO TABS
10.0000 mg | ORAL_TABLET | Freq: Every day | ORAL | Status: DC
Start: 2023-01-09 — End: 2023-01-17
  Administered 2023-01-09 – 2023-01-16 (×8): 10 mg via ORAL
  Filled 2023-01-08 (×8): qty 1

## 2023-01-08 MED ORDER — GABAPENTIN 100 MG PO CAPS
100.0000 mg | ORAL_CAPSULE | Freq: Two times a day (BID) | ORAL | Status: DC
  Administered 2023-01-08 – 2023-01-16 (×15): 100 mg via ORAL
  Filled 2023-01-08 (×16): qty 1

## 2023-01-08 MED ORDER — OYSTER SHELL CALCIUM/D3 500-5 MG-MCG PO TABS
1.0000 | ORAL_TABLET | Freq: Two times a day (BID) | ORAL | Status: DC
  Administered 2023-01-09 – 2023-01-16 (×16): 1 via ORAL
  Filled 2023-01-08 (×17): qty 1

## 2023-01-08 MED ORDER — APIXABAN 2.5 MG PO TABS
2.5000 mg | ORAL_TABLET | Freq: Two times a day (BID) | ORAL | Status: DC
  Administered 2023-01-08 – 2023-01-16 (×16): 2.5 mg via ORAL
  Filled 2023-01-08 (×17): qty 1

## 2023-01-08 MED ORDER — DOXYCYCLINE HYCLATE 100 MG PO TABS
100.0000 mg | ORAL_TABLET | Freq: Two times a day (BID) | ORAL | Status: AC
  Administered 2023-01-08 – 2023-01-15 (×14): 100 mg via ORAL
  Filled 2023-01-08 (×14): qty 1

## 2023-01-08 MED ORDER — BUSPIRONE HCL 10 MG PO TABS
15.0000 mg | ORAL_TABLET | Freq: Two times a day (BID) | ORAL | Status: DC
  Administered 2023-01-08 – 2023-01-16 (×16): 15 mg via ORAL
  Filled 2023-01-08 (×16): qty 2

## 2023-01-08 MED ORDER — IOHEXOL 350 MG/ML SOLN
60.0000 mL | Freq: Once | INTRAVENOUS | Status: AC | PRN
  Administered 2023-01-08: 60 mL via INTRAVENOUS

## 2023-01-08 MED ORDER — ARIPIPRAZOLE 10 MG PO TABS
10.0000 mg | ORAL_TABLET | Freq: Every day | ORAL | Status: DC
  Administered 2023-01-10: 10 mg via ORAL
  Filled 2023-01-08: qty 1
  Filled 2023-01-08: qty 2

## 2023-01-08 MED ORDER — LOSARTAN POTASSIUM 50 MG PO TABS
50.0000 mg | ORAL_TABLET | Freq: Every day | ORAL | Status: DC
  Administered 2023-01-09 – 2023-01-16 (×8): 50 mg via ORAL
  Filled 2023-01-08: qty 2
  Filled 2023-01-08 (×3): qty 1
  Filled 2023-01-08: qty 2
  Filled 2023-01-08 (×2): qty 1
  Filled 2023-01-08: qty 2

## 2023-01-08 NOTE — ED Triage Notes (Signed)
 Pt BIBA from home. C/o bleeding around L pinky toenail, no pain.   AOx4

## 2023-01-08 NOTE — Discharge Instructions (Signed)
 If patient is discharged or sent to facility please make sure she completes the 7-day course of doxycycline 100 twice daily for cellulitis of her right heel.

## 2023-01-08 NOTE — ED Provider Notes (Signed)
 [place] EMERGENCY DEPARTMENT AT [facility] Provider Note   CSN: [id number] Arrival date & time: 01/08/23  1355     History  Chief Complaint  Patient presents with   Toe Injury   Weakness    Susan [name] is a 83 y.o. female.  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   Was admitted to the hospital in October for acute renal failure on stage 3 CKD--had a fall at that time.  Went to [facility] for rehab after that and noticed a progression of symptoms after she was there, increased weakness, tremors, more difficulty speaking. Went to the ED 11/14 for evaluation for these symptoms and had negative MRI brain.    For the last greater than one month has had heel to right foot, lesion on left great toe. Some purulence came from foot and she saw PCP and [facility] and was given referral to podiatry.  She has also had depression.    She has had neuropathy, worried new numbness in toes as she could not feel injury but noted to have prior visits for lower extremity neuropathy and labwork noted for same in 2021.   Barium swallow yesterday went well.   NO fevers, no vomiting  Progressed with depression too, less engaged, more fatigue.   [facility] since she was discharged 6 days ago from [place]. Was walking with PT 80% assistance before she left.  Now needs almost total assistance again.      She reports pain with deep breaths starting today. Chronic cough. No dyspnea.  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)    Family history of anesthesia complication    some wake up during OR; some are hard to wake up; some both (07/29/2012)   GERD (gastroesophageal reflux disease)    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 (HCC)    History of CVA (cerebrovascular accident) 02/01/2015   History of stomach ulcers 1980's   Hyperlipidemia    Hyperparathyroidism, primary (HCC) 04/22/2012   Hypertensive heart disease    Incontinent of urine    wears pads   Major depressive disorder, recurrent episode, 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   OSA on CPAP    Osteoarthritis of right knee    Pedal edema    Persistent atrial fibrillation (HCC)    CHADS2VASC score is 7 - on chronic anticoagulation with Apixaban    Spinal stenosis    Umbilical hernia    unrepaired (07/29/2012)   Varicose veins    BLE (07/29/2012)   Vascular dementia (HCC)    Venous stasis dermatitis of left lower extremity 04/29/2018     Home Medications Prior to Admission medications   Medication Sig Start Date End Date Taking? Authorizing Provider  amLODipine  (NORVASC ) 10 MG tablet Take 1 tablet (10 mg total) by mouth daily. 10/31/22  Amin, Ankit C, MD  ARIPiprazole  (ABILIFY ) 10 MG tablet Take 10 mg by mouth daily. 10/14/22   [provider]  buPROPion  (WELLBUTRIN  XL) 150 MG 24 hr tablet Take 150 mg by mouth daily. 10/14/22   [provider]  busPIRone  (BUSPAR ) 15 MG tablet Take 15 mg by mouth 2 (two) times daily.    [provider]  Calcium  Carb-Cholecalciferol  600-10 MG-MCG TABS Take 1 tablet by mouth 2 (two) times daily. 06/20/21   [provider]  cetirizine (ZYRTEC) 10 MG tablet Take 10 mg by mouth daily. 06/20/21   [provider]  diclofenac  sodium (VOLTAREN ) 1 % GEL Apply  2 g topically 4 (four) times daily. 05/25/18   Caro Harlene POUR, NP  donepezil  (ARICEPT ) 10 MG tablet Take 1 tablet (10 mg total) by mouth at bedtime. For memory loss 05/25/18   Caro Harlene POUR, NP  ELIQUIS  2.5 MG TABS tablet TAKE 1 TABLET BY MOUTH 2 TIMES DAILY 01/04/22   Lelon Hamilton T, PA-C  FEROSUL 325 (65 Fe) MG tablet Take 325 mg by mouth 2 (two) times daily. 06/21/21   [provider]  gabapentin  (NEURONTIN ) 100 MG capsule Take 1 capsule (100 mg total) by mouth 2 (two) times daily. 10/30/22   Amin, Ankit C, MD  hydrALAZINE  (APRESOLINE ) 25 MG tablet TAKE 1 TABLET BY MOUTH 3 TIMES DAILY Patient taking differently: Take 25 mg by mouth 3 (three) times daily. 06/17/18   Caro Harlene POUR, NP  losartan  (COZAAR ) 50 MG tablet TAKE 1 TABLET BY MOUTH EVERY DAY Patient taking differently: Take 50 mg by mouth daily. 08/07/18   Eubanks, Jessica K, NP  Menthol , Topical Analgesic, (BIOFREEZE) 4 % GEL Apply 3 oz topically 3 (three) times daily. For knee pain Patient taking differently: Apply 1 application  topically 3 (three) times daily as needed (knee pain). 09/25/17   Ngetich, Dinah C, NP  Multiple Vitamin (GNP ESSENTIAL ONE DAILY) TABS TAKE 1 TABLET BY MOUTH EVERY DAY Patient taking differently: Take 1 tablet by mouth daily. 06/17/18   Eubanks, Jessica K, NP  pantoprazole  (PROTONIX ) 40 MG tablet Take 1 tablet (40 mg total) by mouth daily before breakfast. 10/30/22   Amin, Ankit C, MD  pravastatin  (PRAVACHOL ) 40 MG tablet TAKE 1 TABLET BY MOUTH EVERY DAY Patient taking differently: Take 40 mg by mouth daily. 06/17/18   Caro Harlene POUR, NP  sertraline  (ZOLOFT ) 100 MG tablet Take 100 mg by mouth daily.    [provider]  SYMBICORT 80-4.5 MCG/ACT inhaler Inhale 2 puffs into the lungs. 02/08/21   [provider]      Allergies    Ace inhibitors, Lipitor [atorvastatin], Simvastatin, and Latex    Review of Systems   Review of Systems  Physical Exam Updated Vital Signs BP  138/74   Pulse (!) 55   Temp 97.9 F (36.6 C) (Oral)   Resp (!) 22   Ht 5' 1 (1.549 m)   Wt 72.6 kg   SpO2 100%   BMI 30.23 kg/m  Physical Exam Vitals and nursing note reviewed.  Constitutional:      General: She is not in acute distress.    Appearance: She is well-developed. She is not diaphoretic.  HENT:     Head: Normocephalic and atraumatic.  Eyes:     Conjunctiva/sclera: Conjunctivae normal.  Cardiovascular:     Rate and Rhythm: Normal rate and regular rhythm.     Heart sounds: Normal heart sounds. No murmur heard.    No  friction rub. No gallop.  Pulmonary:     Effort: Pulmonary effort is normal. No respiratory distress.     Breath sounds: Normal breath sounds. No wheezing or rales.  Chest:     Chest wall: Tenderness present.  Abdominal:     General: There is no distension.     Palpations: Abdomen is soft.     Tenderness: There is no abdominal tenderness. There is no guarding.  Musculoskeletal:        General: Tenderness (right foot/heel ulcer, great toe) present.     Cervical back: Normal range of motion.     Comments: Nail small toe right with slight avulsion with blood under nail, no continued bleeding  Skin:    General: Skin is warm and dry.     Findings: No erythema or rash.     Comments: 3x3 ulcer right heel, tenderness, purulence 1cm left great toe plantar surface   Neurological:     Mental Status: She is alert.     Comments: Mild RLE ext weakness (prior CVA), slow speech Face symmetric, normal EOM, normal visual fields, normal finger to nose     ED Results / Procedures / Treatments   Labs (all labs ordered are listed, but only abnormal results are displayed) Labs Reviewed  CBC WITH DIFFERENTIAL/PLATELET - Abnormal; Notable for the following components:      Result Value   RBC 3.61 (*)    Hemoglobin 11.4 (*)    HCT 35.4 (*)    All other components within normal limits  COMPREHENSIVE METABOLIC PANEL - Abnormal; Notable for the following  components:   Potassium 3.3 (*)    BUN 27 (*)    Creatinine, Ser 1.65 (*)    GFR, Estimated 30 (*)    All other components within normal limits  URINALYSIS, W/ REFLEX TO CULTURE (INFECTION SUSPECTED) - Abnormal; Notable for the following components:   Bacteria, UA RARE (*)    All other components within normal limits  BRAIN NATRIURETIC PEPTIDE - Abnormal; Notable for the following components:   B Natriuretic Peptide 126.0 (*)    All other components within normal limits  RESP PANEL BY RT-PCR (RSV, FLU A&B, COVID)  [id number]  I-STAT CG4 LACTIC ACID, ED  I-STAT CG4 LACTIC ACID, ED  TROPONIN I (HIGH SENSITIVITY)    EKG EKG Interpretation Date/Time:  Wednesday January 08 2023 14:19:29 EST Ventricular Rate:  48 PR Interval:  199 QRS Duration:  177 QT Interval:  462 QTC Calculation: 413 R Axis:   -63  Text Interpretation: Sinus bradycardia Atrial premature complex RBBB and LAFB Left ventricular hypertrophy No significant change since last tracing Confirmed by Dreama Longs ([id number]) on 01/08/2023 3:44:42 PM  Radiology CT Angio Chest PE W and/or Wo Contrast Result Date: 01/08/2023 CLINICAL DATA:  Chest pain, Pulmonary embolism (PE) suspected, high prob EXAM: CT ANGIOGRAPHY CHEST WITH CONTRAST TECHNIQUE: Multidetector CT imaging of the chest was performed using the standard protocol during bolus administration of intravenous contrast. Multiplanar CT image reconstructions and MIPs were obtained to evaluate the vascular anatomy. RADIATION DOSE REDUCTION: This exam was performed according to the departmental dose-optimization program which includes automated exposure control, adjustment of the mA and/or kV according to patient size and/or use of iterative reconstruction technique. CONTRAST:  60mL OMNIPAQUE  IOHEXOL  350 MG/ML SOLN COMPARISON:  Chest radiograph from earlier today. 03/05/2013 chest CT angiogram FINDINGS: Cardiovascular: The study is high quality for the evaluation of pulmonary embolism.  There are no filling defects in the central, lobar, segmental  or subsegmental pulmonary artery branches to suggest acute pulmonary embolism. Atherosclerotic nonaneurysmal thoracic aorta. Normal caliber pulmonary arteries. Mild cardiomegaly. No significant pericardial fluid/thickening. Left anterior descending and left circumflex coronary atherosclerosis. Mediastinum/Nodes: No significant thyroid  nodules. Unremarkable esophagus. No pathologically enlarged axillary, mediastinal or hilar lymph nodes. Lungs/Pleura: No pneumothorax. No pleural effusion. No acute consolidative airspace disease, lung masses or significant pulmonary nodules. Mild hypoventilatory changes in the dependent lungs. Upper abdomen: Cholecystectomy. Simple bilateral upper renal cysts, largest 4.8 cm in the left upper kidney, for which no follow-up imaging is recommended. Left colonic diverticulosis. Musculoskeletal: No aggressive appearing focal osseous lesions. Moderate thoracic spondylosis. Review of the MIP images confirms the above findings. IMPRESSION: 1. No pulmonary embolism. No acute pulmonary disease. 2. Mild cardiomegaly. Two-vessel coronary atherosclerosis. 3. Left colonic diverticulosis. 4.  Aortic Atherosclerosis (ICD10-I70.0). Electronically Signed   By: Selinda DELENA Blue M.D.   On: 01/08/2023 21:02   CT Head Wo Contrast Result Date: 01/08/2023 CLINICAL DATA:  Altered level of consciousness EXAM: CT HEAD WITHOUT CONTRAST TECHNIQUE: Contiguous axial images were obtained from the base of the skull through the vertex without intravenous contrast. RADIATION DOSE REDUCTION: This exam was performed according to the departmental dose-optimization program which includes automated exposure control, adjustment of the mA and/or kV according to patient size and/or use of iterative reconstruction technique. COMPARISON:  11/21/2022 FINDINGS: Brain: No acute infarct or hemorrhage. Diffuse cerebral atrophy with ex vacuo dilatation of the lateral  ventricles again noted. Midline structures are unremarkable. No acute extra-axial fluid collections. No mass effect. Vascular: No hyperdense vessel or unexpected calcification. Skull: Normal. Negative for fracture or focal lesion. Sinuses/Orbits: No acute finding. Other: None. IMPRESSION: 1. No acute intracranial process. Electronically Signed   By: Ozell Daring M.D.   On: 01/08/2023 18:26   DG Foot Complete Left Result Date: 01/08/2023 CLINICAL DATA:  Bilateral foot pain, right greater than left EXAM: LEFT FOOT - COMPLETE 3+ VIEW; RIGHT FOOT COMPLETE - 3+ VIEW COMPARISON:  None Available. FINDINGS: Left foot: Frontal, oblique, and lateral views of the left foot are obtained. Marked calix valgus deformity and osteoarthritis of the first metatarsophalangeal joint. Hammertoe deformities are noted. No acute displaced fracture. No destructive bony abnormalities to suggest osteomyelitis. There is diffuse subcutaneous edema. Right foot: Frontal, oblique, lateral views are obtained. Hammertoe deformities are noted. There is an acute oblique fracture involving the fifth metatarsal diaphysis, with near anatomic alignment of the fracture site. No other acute or destructive bony abnormalities. Diffuse soft tissue edema, greatest within the lateral aspect of the midfoot and hindfoot. IMPRESSION: Left foot: 1. Hallux valgus deformity and osteoarthritis of the first metatarsophalangeal joint. 2. Hammertoe deformities. 3. Diffuse soft tissue edema. No acute or destructive bony abnormality. Right foot: 1. Acute minimally displaced fracture of the fifth metatarsal diaphysis, with near anatomic alignment. 2. Hammertoe deformities. 3. Soft tissue swelling greatest within the lateral aspect of the midfoot and hindfoot. Electronically Signed   By: Ozell Daring M.D.   On: 01/08/2023 16:48   DG Foot Complete Right Result Date: 01/08/2023 CLINICAL DATA:  Bilateral foot pain, right greater than left EXAM: LEFT FOOT - COMPLETE 3+  VIEW; RIGHT FOOT COMPLETE - 3+ VIEW COMPARISON:  None Available. FINDINGS: Left foot: Frontal, oblique, and lateral views of the left foot are obtained. Marked calix valgus deformity and osteoarthritis of the first metatarsophalangeal joint. Hammertoe deformities are noted. No acute displaced fracture. No destructive bony abnormalities to suggest osteomyelitis. There is diffuse subcutaneous edema. Right foot: Frontal, oblique, lateral  views are obtained. Hammertoe deformities are noted. There is an acute oblique fracture involving the fifth metatarsal diaphysis, with near anatomic alignment of the fracture site. No other acute or destructive bony abnormalities. Diffuse soft tissue edema, greatest within the lateral aspect of the midfoot and hindfoot. IMPRESSION: Left foot: 1. Hallux valgus deformity and osteoarthritis of the first metatarsophalangeal joint. 2. Hammertoe deformities. 3. Diffuse soft tissue edema. No acute or destructive bony abnormality. Right foot: 1. Acute minimally displaced fracture of the fifth metatarsal diaphysis, with near anatomic alignment. 2. Hammertoe deformities. 3. Soft tissue swelling greatest within the lateral aspect of the midfoot and hindfoot. Electronically Signed   By: Ozell Daring M.D.   On: 01/08/2023 16:48   DG Chest Portable 1 View Result Date: 01/08/2023 CLINICAL DATA:  cough EXAM: PORTABLE CHEST 1 VIEW COMPARISON:  Chest x-ray 11/21/2022, CT chest 03/05/2013 FINDINGS: Persistent enlarged cardiac silhouette. Otherwise the heart and mediastinal contours are unchanged. Aortic calcification. No focal consolidation. No pulmonary edema. No pleural effusion. No pneumothorax. No acute osseous abnormality. IMPRESSION: No active disease in a patient with stable cardiomegaly. Underlying pericardial effusion not fully excluded. Electronically Signed   By: Morgane  Naveau M.D.   On: 01/08/2023 16:46   DG SWALLOW FUNC OP MEDICARE SPEECH PATH Result Date: 01/07/2023 Table  formatting from the original result was not included. Modified Barium Swallow Study Patient Details Name: AZHARIA SURRATT MRN: [id number] Date of Birth: 09/22/1940 Today's Date: 01/07/2023 HPI/PMH: HPI: Ajanae Virag is an 83 y.o. female with PMH: dementia, CHF, CVA with dysarthria, GERD, MDD, OSA on CPAP. She presented to this OP MBS with her daughter who provided history. Daughter reported that patient seems to take longer with chewing up foods, has to have medications crushed up and mixed with honey and has some coughing with PO intake. Clinical Impression: Clinical Impression: Romi Rathel presents with a mild oropharyngeal dysphagia as per this modified barium swallow study. SLP suspects cognition impacts her swallow function as well, leading to swallow initiation delays and prolonged mastication. Anterior hyoid excursion, laryngeal elevation and laryngeal vestibular closure were all partial in completion. Swallow was initiated at level of pyriform sinus for thin, nectar thick, honey thick liquids and delayed at level of vallecular sinus with puree mechanical soft solids. No penetration or aspiration observed and only trace to no residuals in pyriform sinus s/p initial swallows. Mastication was prolonged and patient with reduced oral containment of bolus. PES opening and transit was [facility] and no retrograde flow of barium observed. 13 mm barium tablet was tolerated with puree solids and sips of nectar thick liquids to help fully transit. SLP recommended and discussed with patient's daughter, to focus on foods that are not tough, crunchy, chewy, hard, dry. Daughter already cuts patient's food up. SLP advised to start trying medications whole, one at a time, in a puree/pudding consistency solid. No further skilled SLP intervention or assessment warranted at this time. Factors that may increase risk of adverse event in presence of aspiration Noe & Lianne 2021): Factors that may increase risk of adverse event in presence  of aspiration Noe & Lianne 2021): Reduced cognitive function Recommendations/Plan: Swallowing Evaluation Recommendations Swallowing Evaluation Recommendations Recommendations: PO diet PO Diet Recommendation: Dysphagia 3 (Mechanical soft); Thin liquids (Level 0) Liquid Administration via: Cup; Straw Medication Administration: Whole meds with puree Swallowing strategies  : Minimize environmental distractions; Slow rate; Small bites/sips Postural changes: Position pt fully upright for meals Treatment Plan Treatment Plan Treatment recommendations: No treatment recommended at this time  Follow-up recommendations: No SLP follow up Recommendations Recommendations for follow up therapy are one component of a multi-disciplinary discharge planning process, led by the attending physician.  Recommendations may be updated based on patient status, additional functional criteria and insurance authorization. Assessment: Orofacial Exam: Orofacial Exam Oral Cavity: Oral Hygiene: WFL Oral Cavity - Dentition: Adequate natural dentition Orofacial Anatomy: WFL Oral Motor/Sensory Function: WFL Anatomy: Anatomy: WFL Boluses Administered: Boluses Administered Boluses Administered: Thin liquids (Level 0); Moderately thick liquids (Level 3, honey thick); Mildly thick liquids (Level 2, nectar thick); Puree; Solid  Oral Impairment Domain: Oral Impairment Domain Lip Closure: No labial escape Tongue control during bolus hold: Not tested Bolus preparation/mastication: Slow prolonged chewing/mashing with complete recollection Bolus transport/lingual motion: Slow tongue motion Oral residue: Trace residue lining oral structures Initiation of pharyngeal swallow : Pyriform sinuses  Pharyngeal Impairment Domain: Pharyngeal Impairment Domain Soft palate elevation: No bolus between soft palate (SP)/pharyngeal wall (PW) Laryngeal elevation: Partial superior movement of thyroid  cartilage/partial approximation of arytenoids to epiglottic petiole Anterior  hyoid excursion: Partial anterior movement Epiglottic movement: Complete inversion Laryngeal vestibule closure: Incomplete, narrow column air/contrast in laryngeal vestibule Pharyngeal stripping wave : Present - complete Pharyngeal contraction (A/P view only): N/A Pharyngoesophageal segment opening: Complete distension and complete duration, no obstruction of flow Tongue base retraction: Trace column of contrast or air between tongue base and PPW Pharyngeal residue: Complete pharyngeal clearance Location of pharyngeal residue: N/A  Esophageal Impairment Domain: Esophageal Impairment Domain Esophageal clearance upright position: Complete clearance, esophageal coating Pill: Pill Consistency administered: Moderately thick liquids (Level 3, honey thick); Puree Penetration/Aspiration Scale Score: Penetration/Aspiration Scale Score 1.  Material does not enter airway: Thin liquids (Level 0); Mildly thick liquids (Level 2, nectar thick); Moderately thick liquids (Level 3, honey thick); Puree; Solid; Pill Compensatory Strategies: Compensatory Strategies Compensatory strategies: Yes Multiple swallows: Effective Effective Multiple Swallows: Moderately thick liquid (Level 3, honey thick); Puree; Solid Liquid wash: Effective Effective Liquid Wash: Pill   General Information: Caregiver present: Yes  Diet Prior to this Study: Regular; Thin liquids (Level 0)   No data recorded  Respiratory Status: WFL   Supplemental O2: None (Room air)   History of Recent Intubation: No  Behavior/Cognition: Alert; Cooperative; Pleasant mood Self-Feeding Abilities: Needs set-up for self-feeding Baseline vocal quality/speech: Normal Volitional Cough: Able to elicit Volitional Swallow: Able to elicit Exam Limitations: Poor positioning (shoulders obscuring) Goal Planning: No data recorded No data recorded No data recorded No data recorded Consulted and agree with results and recommendations: Family member/caregiver; Patient Pain: Pain Assessment Pain  Assessment: No/denies pain End of Session: Start Time:SLP Start Time (ACUTE ONLY): 1115 Stop Time: SLP Stop Time (ACUTE ONLY): 1135 Time Calculation:SLP Time Calculation (min) (ACUTE ONLY): 20 min Charges: SLP Evaluations $ SLP Speech Visit: 1 Visit SLP Evaluations $Outpatient MBS Swallow: 1 Procedure SLP visit diagnosis: SLP Visit Diagnosis: Dysphagia, oropharyngeal phase (R13.12) Past Medical History: Past Medical History: Diagnosis Date  Anxiety   Arthritis   legs, back (07/29/2012)  Cellulitis and abscess of leg 12/29/2017  Chronic diastolic (congestive) heart failure (HCC)   Chronic lower back pain   Complication of anesthesia   I have apnea (07/29/2012)  Dementia (HCC) 03/12/2018  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)   Family history of anesthesia complication   some wake up during OR; some are hard to wake up; some both (07/29/2012)  GERD (gastroesophageal reflux disease)   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 (HCC)   History of CVA (cerebrovascular accident) 02/01/2015  History of stomach ulcers 1980's  Hyperlipidemia   Hyperparathyroidism, primary (HCC) 04/22/2012  Hypertensive heart disease   Incontinent of urine   wears pads  Major depressive disorder, recurrent episode, 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  OSA on CPAP   Osteoarthritis of right knee   Pedal edema   Persistent atrial fibrillation (HCC)   CHADS2VASC score is 7 - on chronic anticoagulation with Apixaban   Spinal stenosis   Umbilical hernia   unrepaired (07/29/2012)  Varicose veins   BLE (07/29/2012)  Vascular dementia (HCC)   Venous stasis dermatitis of left lower extremity 04/29/2018 Past Surgical History: Past Surgical History: Procedure Laterality Date  CATARACT EXTRACTION    COLONOSCOPY    ENDOVENOUS ABLATION SAPHENOUS VEIN W/ LASER Left 06/24/2019  endovenous laser ablation left greater saphenous vein by Medford Blade MD   IUD REMOVAL  1980's  PARATHYROIDECTOMY N/A 06/16/2012  Procedure: NECK EXPLORATION AND LEFT  SUPERIOR PARATHYROIDECTOMY;  Surgeon: Krystal CHRISTELLA Spinner, MD;  Location: [facility] ORS;  Service: General;  Laterality: N/A;  TOTAL KNEE ARTHROPLASTY Right 07/29/2012  TOTAL KNEE ARTHROPLASTY Right 07/29/2012  Procedure: TOTAL KNEE ARTHROPLASTY;  Surgeon: Toribio JULIANNA Chancy, MD;  Location: [facility];  Service: Orthopedics;  Laterality: Right;  TOTAL KNEE ARTHROPLASTY Left 12/13/2019  Procedure: LEFT TOTAL KNEE ARTHROPLASTY;  Surgeon: Jerri Kay CHRISTELLA, MD;  Location: [facility] OR;  Service: Orthopedics;  Laterality: Left; Norleen IVAR Blase, MA, CCC-SLP Speech Therapy CLINICAL DATA:  83 year old female with dysphagia. He presents for MBS with speech pathologist. EXAM: MODIFIED BARIUM SWALLOW TECHNIQUE: Different consistencies of barium were administered orally to the patient by the Speech Pathologist. Imaging of the pharynx was performed in the lateral projection. Aimee Han, PA-C was present in the fluoroscopy room during this study, which was supervised and interpreted by Aliene Lloyd, MD. FLUOROSCOPY: Radiation Exposure Index (as provided by the fluoroscopic device): 6.47 mGy Kerma COMPARISON:  None available. FINDINGS: Vestibular  Penetration:  None seen. Aspiration:  None seen. Other:  None. IMPRESSION: No penetration or aspiration with all consistencies. Please refer to the Speech Pathologists report for complete details and recommendations. Electronically Signed   By: Aliene Lloyd M.D.   On: 01/07/2023 12:06   Procedures Procedures    Medications Ordered in ED Medications  doxycycline  (VIBRA -TABS) tablet 100 mg (100 mg Oral Given 01/08/23 2231)  amLODipine  (NORVASC ) tablet 10 mg (has no administration in time range)  ARIPiprazole  (ABILIFY ) tablet 10 mg (has no administration in time range)  buPROPion  (WELLBUTRIN  XL) 24 hr tablet 150 mg (has no administration in time range)  busPIRone  (BUSPAR ) tablet 15 mg (has no administration in time range)  Calcium  Carb-Cholecalciferol  600-10 MG-MCG TABS 1 tablet (has no administration in time  range)  loratadine  (CLARITIN ) tablet 10 mg (has no administration in time range)  donepezil  (ARICEPT ) tablet 10 mg (has no administration in time range)  apixaban  (ELIQUIS ) tablet 2.5 mg (has no administration in time range)  gabapentin  (NEURONTIN ) capsule 100 mg (has no administration in time range)  hydrALAZINE  (APRESOLINE ) tablet 25 mg (has no administration in time range)  losartan  (COZAAR ) tablet 50 mg (has no administration in time range)  pantoprazole  (PROTONIX ) EC tablet 40 mg (has no administration in time range)  pravastatin  (PRAVACHOL ) tablet 40 mg (has no administration in time range)  sertraline  (ZOLOFT ) tablet 100 mg (has no administration in time range)  mometasone -formoterol  (DULERA ) 100-5 MCG/ACT inhaler 2 puff (has no administration in time range)  iohexol  (OMNIPAQUE ) 350 MG/ML injection 60 mL (60 mLs Intravenous Contrast Given 01/08/23 1955)  ED Course/ Medical Decision Making/ A&P                                  83 year old female with a history of dementia, hyperlipidemia, 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  Regarding left pinky toe--nail slightly lifted, recommend supportive care, follow up with podiatry. It is hemostatic without signs of significant laceration, no indication for nail removal or signs of infection of this toe.  Will obtain XR to screen for injuries or for underlying infection in setting of great toe ulcer.  Regarding right heel ulcer--was able to express purulence from this area, has tenderness and have concern for infection. NO sign of deep space abscess,  cellulitis. Screening XR ordered to evalute for signs of osteomyelitis however have low clinical suspicion for this at this time.  Suspect chronic wound with infection and has not yet been trialaed on abx.  Plan to give doxycycline , also recommend podiatry follow up pending foot XR results.  Appropriate for outpatient treatment.  Regarding chest pain--pleuritic pain beginning today. Have ordered ECG< troponin, CXR.  Consider PE Given relative immobility.  Signed out with continued work up pending.  Regarding generalized weakness--labs, CT head ordered (in setting of eliquis , increasing weakness.)  Much of the symptoms have been progressive over months, in setting of prior CVA, hx of vascular dementia per chart, decline following hospitalization in October. She had prior MRI brain in November without acute CVA.  -Labs, CXR, UA pending to evaluate for other etiologies of her worsening weakness.    Family is concerned regarding her ability to get around independent living facility and that while insurance company had recommended discharge from rehab last week they did not feel she was ready and since she has been in independent living has progressed to wheelchair bound and needing them to essentially carry her into the bathroom.  Has continued medical work up pending. TOC consult placed given concerns regarding her living at independent living facility.        Final Clinical Impression(s) / ED Diagnoses Final diagnoses:  Generalized weakness  Ulcer of right foot with other severity (HCC)  Chest pain on breathing    Rx / DC Orders ED Discharge Orders     None         Dreama Longs, MD 01/08/23 2233

## 2023-01-08 NOTE — ED Notes (Signed)
 In and out could not be completed. Purwick in place.

## 2023-01-08 NOTE — Progress Notes (Signed)
 Orthopedic Tech Progress Note Patient Details:  Susan [name] 1940/05/09 [id number]  Ortho Devices Type of Ortho Device: Postop shoe/boot Ortho Device/Splint Location: right Ortho Device/Splint Interventions: Ordered, Application, Adjustment   Post Interventions Patient Tolerated: Well Instructions Provided: Adjustment of device, Care of device  Waylan Thom Loving 01/08/2023, 6:16 PM

## 2023-01-08 NOTE — ED Provider Notes (Signed)
 Patient received an handoff at 4 PM.  Here with some generalized weakness, failure to thrive.  Mostly wheelchair-bound now living at home and family unable to support her ADLs.  She does not have any home health or support other than her family at this time.  She has been dealing with some chronic wounds on her feet specifically her right heel.  She has maybe had some chest discomfort at times.  But on my evaluation she is not having any chest pain shortness of breath or infectious symptoms.  She is already had blood work done that is unremarkable with no significant anemia or electrolyte abnormality or kidney injury.  Lactic acid is normal.  Urinalysis is negative.  Troponin is normal.  PE scan is negative.  No pneumonia no major pericardial effusion or volume overload.  She had x-rays done of her feet that showed no osteomyelitis but did show some soft tissue edema.  She is not having any obvious major purulence.  Supposed to follow-up with wound care outpatient.  She is got Doppler pulses on exam.  I do not think there is any major arterial process.  There is seem to be a fracture of her right pinky toe and she was placed in a postop shoe.  Overall we will put her on doxycycline  to treat for possible cellulitis of her heel but at this time I do not think she needs any inpatient treatment.  We will board her in the ED for PT OT and placement and social work and case management support.  I think she would benefit from home health versus short-term living facility.  We talked with family about trying to provide them more support as it does not appear that she is safe to be taking care of at home by herself.  Home medications have been ordered.  If she does go home or go to a facility she should be sent home on doxycycline  twice daily for 7 days.  This chart was dictated using voice recognition software.  Despite best efforts to proofread,  errors can occur which can change the documentation meaning.     Ruthe Cornet, DO 01/08/23 2229

## 2023-01-09 NOTE — Progress Notes (Signed)
 Provided daughter [name] an update. No bed offers at this time. Will follow up with pending SNFs in the morning.

## 2023-01-09 NOTE — NC FL2 (Signed)
 [place]  MEDICAID [id number] LEVEL OF CARE FORM     IDENTIFICATION  Patient Name: Susan [name] Birthdate: 1940/12/23 Sex: female Admission Date (Current Location): 01/08/2023  [facility] and [place] Number:  Producer, Television/film/video and Address:  [facility],  [address], [place] [address]      Provider Number: [id number]  Attending Physician Name and Address:  System, Provider Not In  Relative Name and Phone Number:       Current Level of Care: Hospital Recommended Level of Care: Skilled Nursing Facility Prior Approval Number:    Date Approved/Denied:   PASRR Number: [id number]  Discharge Plan: SNF    Current Diagnoses: Patient Active Problem List   Diagnosis Date Noted   Fall at home, initial encounter 10/24/2022   Generalized weakness 10/24/2022   Troponin level elevated 10/24/2022   Hypercalcemia 10/24/2022   Sterile pyuria 10/24/2022   Chronic diastolic CHF (congestive heart failure) (HCC) 10/24/2022   Paroxysmal atrial fibrillation (HCC) 10/24/2022   AKI (acute kidney injury) (HCC) 10/24/2022   Acute renal failure superimposed on stage 3b chronic kidney disease (HCC) 10/23/2022   Preoperative cardiovascular examination 07/08/2021   Suicidal ideation 07/05/2021   Acute pain of left knee 03/01/2020   Hx of total knee replacement, left 02/01/2020   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   Anemia of chronic disease 09/07/2018   Primary osteoarthritis of left knee 06/16/2018   Venous stasis dermatitis of left lower extremity 04/29/2018   Dementia (HCC) 03/12/2018   Recurrent cellulitis 02/12/2018   Essential hypertension 02/12/2018   Cellulitis and abscess of leg 12/29/2017   CKD (chronic kidney disease) stage 3, GFR 30-59 ml/min (HCC) 12/29/2017   Cellulitis of left lower extremity    Ankle edema    Sepsis (HCC) 12/21/2017   Medication management 10/11/2016   Hyperlipidemia  06/18/2016   Gait abnormality 06/18/2016   Major depressive disorder, recurrent episode, 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   Edema 02/09/2015   History of CVA (cerebrovascular accident) 02/01/2015   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 (HCC)    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)    Chronic low back pain 01/28/2015   Short-term memory loss 01/28/2015   Volume depletion 01/28/2015   Persistent atrial fibrillation 01/26/2015   DCM (dilated cardiomyopathy) (HCC) 01/26/2015   TIA (transient ischemic attack) 01/24/2015   (HFpEF) heart failure with preserved ejection fraction (HCC)    Obesity (BMI 30-39.9) 01/27/2013   Constipation 09/02/2012   H/O arthroscopy of right knee 08/06/2012   Anxiety    Major depressive disorder    GERD (gastroesophageal reflux disease)    Hypertensive heart disease    OSA (obstructive sleep apnea)    Osteoarthritis of right knee    Hyperparathyroidism, primary (HCC) 04/22/2012   Chronic cough 10/02/2011    Orientation RESPIRATION BLADDER Height & Weight     Self  Normal Incontinent Weight: 160 lb (72.6 kg) Height:  5' 1 (154.9 cm)  BEHAVIORAL SYMPTOMS/MOOD NEUROLOGICAL BOWEL NUTRITION STATUS      Incontinent Diet (Regular)  AMBULATORY STATUS COMMUNICATION OF NEEDS Skin   Extensive Assist Verbally Normal                       Personal Care Assistance Level of Assistance  Bathing, Feeding, Dressing Bathing Assistance: Maximum assistance Feeding assistance: Limited assistance Dressing Assistance: Maximum assistance     Functional Limitations Info  Sight,  Hearing, Speech Sight Info: Adequate Hearing Info: Adequate Speech Info: Adequate    SPECIAL CARE FACTORS FREQUENCY  PT (By licensed PT), OT (By licensed OT)     PT Frequency: x5/week OT Frequency: x5/week            Contractures Contractures Info: Not present    Additional Factors Info  Code Status, Allergies Code Status Info: Full Allergies Info:  Ace Inhibitors  Lipitor (Atorvastatin)  Simvastatin  Latex           Current Medications (01/09/2023):  This is the current hospital active medication list Current Facility-Administered Medications  Medication Dose Route Frequency Provider Last Rate Last Admin   amLODipine  (NORVASC ) tablet 10 mg  10 mg Oral Daily Curatolo, Adam, DO       apixaban  (ELIQUIS ) tablet 2.5 mg  2.5 mg Oral BID Curatolo, Adam, DO   2.5 mg at 01/09/23 0941   ARIPiprazole  (ABILIFY ) tablet 10 mg  10 mg Oral Daily Curatolo, Adam, DO       buPROPion  (WELLBUTRIN  XL) 24 hr tablet 150 mg  150 mg Oral Daily Curatolo, Adam, DO   150 mg at 01/09/23 9058   busPIRone  (BUSPAR ) tablet 15 mg  15 mg Oral BID Curatolo, Adam, DO   15 mg at 01/09/23 9057   calcium -vitamin D  (OSCAL WITH D) 500-5 MG-MCG per tablet 1 tablet  1 tablet Oral BID WC Curatolo, Adam, DO   1 tablet at 01/09/23 9188   donepezil  (ARICEPT ) tablet 10 mg  10 mg Oral QHS Curatolo, Adam, DO   10 mg at 01/08/23 2334   doxycycline  (VIBRA -TABS) tablet 100 mg  100 mg Oral Q12H Curatolo, Adam, DO   100 mg at 01/09/23 9058   gabapentin  (NEURONTIN ) capsule 100 mg  100 mg Oral BID Curatolo, Adam, DO   100 mg at 01/08/23 2334   hydrALAZINE  (APRESOLINE ) tablet 25 mg  25 mg Oral TID Curatolo, Adam, DO   25 mg at 01/09/23 9057   loratadine  (CLARITIN ) tablet 10 mg  10 mg Oral Daily Curatolo, Adam, DO   10 mg at 01/09/23 9062   losartan  (COZAAR ) tablet 50 mg  50 mg Oral Daily Curatolo, Adam, DO   50 mg at 01/09/23 9058   mometasone -formoterol  (DULERA ) 100-5 MCG/ACT inhaler 2 puff  2 puff Inhalation BID Curatolo, Adam, DO       pantoprazole  (PROTONIX ) EC tablet 40 mg  40 mg Oral QAC breakfast Curatolo, Adam, DO   40 mg at 01/09/23 9188   pravastatin  (PRAVACHOL ) tablet 40 mg  40 mg Oral Daily Curatolo, Adam, DO   40 mg at 01/09/23 9058   sertraline  (ZOLOFT ) tablet 100 mg  100 mg Oral Daily Curatolo, Adam, DO   100 mg at 01/09/23 9062   Current Outpatient Medications  Medication Sig  Dispense Refill   ELIQUIS  2.5 MG TABS tablet TAKE 1 TABLET BY MOUTH 2 TIMES DAILY 60 tablet 6   amLODipine  (NORVASC ) 10 MG tablet Take 1 tablet (10 mg total) by mouth daily. (Patient not taking: Reported on 01/08/2023)     buPROPion  (WELLBUTRIN  XL) 150 MG 24 hr tablet Take 150 mg by mouth daily.     busPIRone  (BUSPAR ) 15 MG tablet Take 15 mg by mouth 2 (two) times daily.     Calcium  Carb-Cholecalciferol  600-10 MG-MCG TABS Take 1 tablet by mouth 2 (two) times daily.     cetirizine (ZYRTEC) 10 MG tablet Take 10 mg by mouth daily.     diclofenac  sodium (VOLTAREN ) 1 %  GEL Apply 2 g topically 4 (four) times daily. 1 Tube 5   donepezil  (ARICEPT ) 10 MG tablet Take 1 tablet (10 mg total) by mouth at bedtime. For memory loss 90 tablet 1   FEROSUL 325 (65 Fe) MG tablet Take 325 mg by mouth 2 (two) times daily.     gabapentin  (NEURONTIN ) 100 MG capsule Take 1 capsule (100 mg total) by mouth 2 (two) times daily. (Patient taking differently: Take 100 mg by mouth daily.)     hydrALAZINE  (APRESOLINE ) 25 MG tablet TAKE 1 TABLET BY MOUTH 3 TIMES DAILY (Patient taking differently: Take 25 mg by mouth 3 (three) times daily.) 90 tablet 5   losartan  (COZAAR ) 50 MG tablet TAKE 1 TABLET BY MOUTH EVERY DAY (Patient taking differently: Take 50 mg by mouth daily.) 30 tablet 3   Menthol , Topical Analgesic, (BIOFREEZE) 4 % GEL Apply 3 oz topically 3 (three) times daily. For knee pain (Patient taking differently: Apply 1 application  topically 3 (three) times daily as needed (knee pain).) 1 Tube 3   Multiple Vitamin (GNP ESSENTIAL ONE DAILY) TABS TAKE 1 TABLET BY MOUTH EVERY DAY (Patient taking differently: Take 1 tablet by mouth daily.) 30 tablet 11   pantoprazole  (PROTONIX ) 40 MG tablet Take 1 tablet (40 mg total) by mouth daily before breakfast.     pravastatin  (PRAVACHOL ) 40 MG tablet TAKE 1 TABLET BY MOUTH EVERY DAY (Patient taking differently: Take 40 mg by mouth daily.) 30 tablet 5   sertraline  (ZOLOFT ) 100 MG tablet Take  100 mg by mouth daily.     SYMBICORT 80-4.5 MCG/ACT inhaler Inhale 2 puffs into the lungs.       Discharge Medications: Please see discharge summary for a list of discharge medications.  Relevant Imaging Results:  Relevant Lab Results:   Additional Information SSN: [id number]  [name], LCSW

## 2023-01-09 NOTE — Evaluation (Signed)
 Physical Therapy Evaluation Patient Details Name: Susan [name] MRN: [id number] DOB: 20-Jul-1940 Today's Date: 01/09/2023  History of Present Illness  83 year old female presents to Ed 01/08/23 with  concern for bleeding around Left small toe nail, right heel ulcer, progressive, generalized weakness over the last few months, chest pain, nondisplaced fracture of right 5th MT. 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  Pt currently with functional limitations due to the deficits listed below (see PT Problem List). Pt will benefit from acute skilled PT to increase their independence and safety with mobility to allow discharge.    Patient's daughter present to  provide information of patient's PLF, which was not clear.  Sounds like patient  left SNF/[facility]HC on 12/25 to stay with a niece, returned to her apartment 12/29 and had hired caregivers as patient  is currently WC bound.  Patient  reports pain on the right foot with pressure, unable to stand this visit.  Per daughter, patient made little progress  at SNF, as patient had been living alone  prior to 10//24 hospitalization. Patient will benefit from continued inpatient follow up therapy, <3 hours/day       If plan is discharge home, recommend the following: Two people to help with walking and/or transfers;A lot of help with bathing/dressing/bathroom;Assist for transportation;Help with stairs or ramp for entrance   Can travel by private vehicle    no    Equipment Recommendations None recommended by PT  Recommendations for Other Services       Functional Status Assessment Patient has had a recent decline in their functional status and/or demonstrates limited ability to make significant improvements in function in a reasonable and predictable amount of time     Precautions /  Restrictions Precautions Precautions: Fall Required Braces or Orthoses: Other Brace Other Brace: post op shoe on right(5th MT fx) Restrictions Weight Bearing Restrictions Per Provider Order: No Other Position/Activity Restrictions: non listed      Mobility  Bed Mobility Overal bed mobility: Needs Assistance Bed Mobility: Supine to Sit, Sit to Supine     Supine to sit: Max assist Sit to supine: Max assist   General bed mobility comments: patient initiates moving the legs toward bed edge, required max assist to for legs over bed edge, max support to lift  trunk to sitting, Max support to return legs onto stretcher.    Transfers                   General transfer comment: unable to tolerate WB on the right foot as well as the stretcher height.    Ambulation/Gait                  Stairs            Wheelchair Mobility     Tilt Bed    Modified Rankin (Stroke Patients Only)       Balance Overall balance assessment: Needs assistance Sitting-balance support: Bilateral upper extremity supported, Feet supported Sitting balance-Leahy Scale: Poor Sitting balance - Comments: tends to lean backwards                                     Pertinent Vitals/Pain Pain Assessment Pain Assessment: Faces Faces Pain Scale: Hurts even more Pain Location: right  foot, right side  ribs(states got hurt when she was picked up  , Dtr questioning pt. accuracy)    Home Living Family/patient expects to be discharged to:: Private residence Living Arrangements: Other relatives Available Help at Discharge: Family;Available PRN/intermittently Type of Home: Apartment Home Access: Elevator       Home Layout: One level   Additional Comments: per daughter present, left [facility] 12/25 travel to [facility], stayed with niece  and returned 01/05/23, arranged for   [facility] aides and family to stay and assist    Prior Function Prior Level of Function : Needs assist              Mobility Comments: per dtr, has required increased assistance , transfers only to/from WC,since going to SNF in 10/24. Noted to ambulate short distance when at [facility] in 10/24. ADLs Comments: requiring assistance for ADL's per daughter     Extremity/Trunk Assessment   Upper Extremity Assessment Upper Extremity Assessment: Generalized weakness    Lower Extremity Assessment Lower Extremity Assessment: LLE deficits/detail;RLE deficits/detail RLE Deficits / Details: limited knee flexion to ~ 70, post op shoe in place LLE Deficits / Details: decreased knee flex to  70 ,    Cervical / Trunk Assessment Cervical / Trunk Assessment: Other exceptions Cervical / Trunk Exceptions: decreased headrotation to left, decreased lateral head bend to left  Communication   Communication Communication: No apparent difficulties Cueing Techniques: Verbal cues  Cognition Arousal: Alert Behavior During Therapy: WFL for tasks assessed/performed Overall Cognitive Status: History of cognitive impairments - at baseline                                 General Comments: dtr present to give  history        General Comments      Exercises     Assessment/Plan    PT Assessment Patient needs continued PT services  PT Problem List Decreased strength;Decreased balance;Decreased cognition;Decreased knowledge of precautions;Decreased range of motion;Decreased mobility;Pain;Decreased activity tolerance       PT Treatment Interventions DME instruction;Therapeutic activities;Gait training;Therapeutic exercise;Patient/family education;Functional mobility training;Balance training    PT Goals (Current goals can be found in the Care Plan section)  Acute Rehab PT Goals Patient Stated Goal: to get back walking PT Goal Formulation: With patient/family Time For Goal Achievement: 01/23/23 Potential to Achieve Goals: Fair    Frequency Min 1X/week     Co-evaluation                AM-PAC PT 6 Clicks Mobility  Outcome Measure Help needed turning from your back to your side while in a flat bed without using bedrails?: Total Help needed moving from lying on your back to sitting on the side of a flat bed without using bedrails?: Total Help needed moving to and from a bed to a chair (including a wheelchair)?: Total Help needed standing up from a chair using your arms (e.g., wheelchair or bedside chair)?: Total Help needed to walk in hospital room?: Total Help needed climbing 3-5 steps with a railing? : Total 6 Click Score: 6    End of Session   Activity Tolerance: Patient tolerated treatment well Patient left: in bed;with call bell/phone within reach;with family/visitor present Nurse Communication: Mobility status PT Visit Diagnosis: Other abnormalities of gait and mobility (R26.89)    Time: 8856-8785 PT Time Calculation (min) (ACUTE ONLY): 31 min   Charges:   PT Evaluation $PT Eval Low Complexity: 1 Low  PT Treatments $Therapeutic Activity: 8-22 mins PT General Charges $$ ACUTE PT VISIT: 1 Visit        Darice Potters PT Acute Rehabilitation Services Office [phone] Weekend pager-[phone]   Potters Darice Norris 01/09/2023, 1:18 PM

## 2023-01-09 NOTE — Progress Notes (Signed)
 Transition of Care [facility]) - Emergency Department Mini Assessment   Patient Details  Name: Susan [name] MRN: [id number] Date of Birth: 08/05/1940  Transition of Care [facility]) CM/SW Contact:    [name], LCSW Phone Number: 01/09/2023, 2:34 PM   Clinical Narrative: [facility] consulted for SNF. CSW spoke with pt and daughter at bedside. Daughter reported they are interested in SNF and expressed satisfaction with pt's experience at [facility]. CSW explained difference between [facility] and SNF - daughter, [name], verbalized understanding. [name] reported medication concerns that have since been addressed. [name] also stated pt was previously at [facility] but did not improve as expected. CSW encouraged [name] and family to visit [email or web address] One of [name]'s preferred facilities is [facility]. Pt has been faxed out. Bed offers pending.    ED Mini Assessment: What brought you to the Emergency Department? : bleeding pinky toe, weakness  Barriers to Discharge: SNF Pending bed offer     Means of departure: Ambulance       Patient Contact and Communications Key Contact 1: Tameaka Eichhorn - [phone]   Spoke with: Khloei Spiker - [phone] Contact Date: 01/09/23,   Contact time: 0200 Contact Phone Number: [phone] Call outcome: Interested in pt going to SNF    CMS Medicare.gov Compare Post Acute Care list provided to:: Patient Represenative (must comment) (Adult children) Choice offered to / list presented to : Adult Children  Admission diagnosis:  Wound Check Patient Active Problem List   Diagnosis Date Noted   Fall at home, initial encounter 10/24/2022   Generalized weakness 10/24/2022   Troponin level elevated 10/24/2022   Hypercalcemia 10/24/2022   Sterile pyuria 10/24/2022   Chronic diastolic CHF (congestive heart failure) (HCC) 10/24/2022   Paroxysmal atrial fibrillation (HCC) 10/24/2022   AKI (acute kidney injury) (HCC) 10/24/2022   Acute renal failure superimposed on stage 3b chronic kidney  disease (HCC) 10/23/2022   Preoperative cardiovascular examination 07/08/2021   Suicidal ideation 07/05/2021   Acute pain of left knee 03/01/2020   Hx of total knee replacement, left 02/01/2020   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   Anemia of chronic disease 09/07/2018   Primary osteoarthritis of left knee 06/16/2018   Venous stasis dermatitis of left lower extremity 04/29/2018   Dementia (HCC) 03/12/2018   Recurrent cellulitis 02/12/2018   Essential hypertension 02/12/2018   Cellulitis and abscess of leg 12/29/2017   CKD (chronic kidney disease) stage 3, GFR 30-59 ml/min (HCC) 12/29/2017   Cellulitis of left lower extremity    Ankle edema    Sepsis (HCC) 12/21/2017   Medication management 10/11/2016   Hyperlipidemia 06/18/2016   Gait abnormality 06/18/2016   Major depressive disorder, recurrent episode, 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   Edema 02/09/2015   History of CVA (cerebrovascular accident) 02/01/2015   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 (HCC)    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)    Chronic low back pain 01/28/2015   Short-term memory loss 01/28/2015   Volume depletion 01/28/2015   Persistent atrial fibrillation 01/26/2015   DCM (dilated cardiomyopathy) (HCC) 01/26/2015   TIA (transient ischemic attack) 01/24/2015   (HFpEF) heart failure with preserved ejection fraction (HCC)    Obesity (BMI 30-39.9) 01/27/2013   Constipation 09/02/2012   H/O arthroscopy of right knee 08/06/2012   Anxiety    Major depressive disorder    GERD (gastroesophageal reflux disease)    Hypertensive heart disease    OSA (  obstructive sleep apnea)    Osteoarthritis of right knee    Hyperparathyroidism, primary (HCC) 04/22/2012   Chronic cough 10/02/2011   PCP:  Claudene Prentice DELENA Mickey., FNP Pharmacy:   [facility] [place], [place] - [address]  [address] [place] [address] Phone: [phone] Fax: [phone]

## 2023-01-10 ENCOUNTER — Emergency Department (HOSPITAL_COMMUNITY): Payer: Medicare Other

## 2023-01-10 MED ORDER — HYDROCODONE-ACETAMINOPHEN 5-325 MG PO TABS
1.0000 | ORAL_TABLET | Freq: Four times a day (QID) | ORAL | Status: DC | PRN
  Administered 2023-01-10 – 2023-01-13 (×7): 1 via ORAL
  Filled 2023-01-10 (×7): qty 1

## 2023-01-10 NOTE — ED Provider Notes (Addendum)
 Took a look at patient's right heel and it does seem to maybe have some evidence of some skin breakdown although the skin is not open.  Seems to be some tenderness there.  Patient did have x-rays of her right and left foot on January 1.  And particularly the x-ray of the right foot showed an acute minimally displaced fracture of the fifth metatarsal with near anatomic alignment.  Hammertoe deformities and soft tissue swelling greatest within the lateral aspect of the midfoot and hindfoot.  So it does show some abnormalities there we will just repeat the x-ray just to make sure no evidence of osteomyelitis.   Howie Rufus, MD 01/10/23 1804   Patient's right heel x-ray without any changes as far as the bones go.  There is a little bit of swelling at the heel area which we could see clinically.  No evidence of any osteomyelitis.  Clinically the heel without any significant infection.   Bethenny Losee, MD 01/10/23 2003

## 2023-01-10 NOTE — Progress Notes (Signed)
 CSW presented bed offer [facility]) to pt's daughter, [name]. [name] accepted. Notified [place] at [facility]. Auth pending for [employer].

## 2023-01-10 NOTE — ED Provider Notes (Signed)
 Emergency Medicine Observation Re-evaluation Note  Susan [name] is a 83 y.o. female, seen on rounds today.  Pt initially presented to the ED for complaints of Toe Injury and Weakness Currently, the patient is sleeping.  Physical Exam  BP (!) 160/69 (BP Location: Right Arm)   Pulse 61   Temp 98.1 F (36.7 C) (Oral)   Resp 20   Ht 5' 1 (1.549 m)   Wt 72.6 kg   SpO2 100%   BMI 30.23 kg/m  Physical Exam General: nad Cardiac: regular Lungs: clear Psych: calm and cooperative Swelling over the foot without breakdown  ED Course / MDM  EKG:EKG Interpretation Date/Time:  Wednesday January 08 2023 14:19:29 EST Ventricular Rate:  48 PR Interval:  199 QRS Duration:  177 QT Interval:  462 QTC Calculation: 413 R Axis:   -63  Text Interpretation: Sinus bradycardia Atrial premature complex RBBB and LAFB Left ventricular hypertrophy No significant change since last tracing Confirmed by Dreama Longs ([id number]) on 01/08/2023 3:44:42 PM  I have reviewed the labs performed to date as well as medications administered while in observation.  Recent changes in the last 24 hours include none.  Plan  Current plan is for waiting placement. [facility] was offered to pt which she accepted and waiting approval.    Doretha Folks, MD 01/10/23 [phone]

## 2023-01-11 ENCOUNTER — Encounter (HOSPITAL_COMMUNITY): Payer: Self-pay | Admitting: Emergency Medicine

## 2023-01-11 NOTE — ED Provider Notes (Signed)
 Emergency Medicine Observation Re-evaluation Note  Susan [name] is a 83 y.o. female, seen on rounds today.  Pt initially presented to the ED for complaints of Toe Injury and Weakness Currently, the patient is asleep.  Pt presented with weakness.  Family is unable to help her with all her ADLs, so she's been here waiting for placement since 1/1.  Physical Exam  BP (!) 147/73 (BP Location: Right Arm)   Pulse 70   Temp 98.1 F (36.7 C) (Oral)   Resp 20   Ht 5' 1 (1.549 m)   Wt 72.6 kg   SpO2 99%   BMI 30.23 kg/m  Physical Exam General: asleep Cardiac: rr Lungs: clear Psych: calm  ED Course / MDM  EKG:EKG Interpretation Date/Time:  Wednesday January 08 2023 14:19:29 EST Ventricular Rate:  48 PR Interval:  199 QRS Duration:  177 QT Interval:  462 QTC Calculation: 413 R Axis:   -63  Text Interpretation: Sinus bradycardia Atrial premature complex RBBB and LAFB Left ventricular hypertrophy No significant change since last tracing Confirmed by Dreama Longs ([id number]) on 01/08/2023 3:44:42 PM  I have reviewed the labs performed to date as well as medications administered while in observation.  Recent changes in the last 24 hours include bed offer at [facility].  Plan  Current plan is for Rehab placement.    Dean Clarity, MD 01/11/23 [phone]

## 2023-01-11 NOTE — Progress Notes (Signed)
 Insurance auth pending.   Valentina Shaggy.Jeovanni Heuring, MSW, LCSWA [facility]  Transitions of Care Clinical Social Worker I Direct Dial: [phone]  Fax: [phone] [email or web address]

## 2023-01-12 MED ORDER — ACETAMINOPHEN 325 MG PO TABS
650.0000 mg | ORAL_TABLET | Freq: Three times a day (TID) | ORAL | Status: DC | PRN
  Administered 2023-01-12 – 2023-01-13 (×2): 650 mg via ORAL
  Filled 2023-01-12 (×2): qty 2

## 2023-01-12 NOTE — ED Notes (Signed)
 Breakfast tray given and patient swallowed all pills without any issues.

## 2023-01-12 NOTE — ED Provider Notes (Signed)
 Emergency Medicine Observation Re-evaluation Note  Susan [name] is a 83 y.o. female, seen on rounds today.  Pt initially presented to the ED for complaints of Toe Injury and Weakness Currently, the patient is resting quietly.  Physical Exam  BP (!) 189/89   Pulse 79   Temp 98.1 F (36.7 C) (Oral)   Resp 14   Ht 5' 1 (1.549 m)   Wt 72.6 kg   SpO2 94%   BMI 30.23 kg/m  Physical Exam General: No acute distress Cardiac: Well-perfused Lungs: Nonlabored Psych: Calm  ED Course / MDM  EKG:EKG Interpretation Date/Time:  Wednesday January 08 2023 14:19:29 EST Ventricular Rate:  48 PR Interval:  199 QRS Duration:  177 QT Interval:  462 QTC Calculation: 413 R Axis:   -63  Text Interpretation: Sinus bradycardia Atrial premature complex RBBB and LAFB Left ventricular hypertrophy No significant change since last tracing Confirmed by Dreama Longs ([id number]) on 01/08/2023 3:44:42 PM  I have reviewed the labs performed to date as well as medications administered while in observation.  Recent changes in the last 24 hours include social work working on placement.  Plan  Current plan is for placement.    Towana Ozell BROCKS, MD 01/12/23 [phone]

## 2023-01-12 NOTE — ED Notes (Signed)
 Patient called this nurse to the bedside asking for pain medications due to her pain in the right foot. Took off patient's ortho shoe, place pillow underneath patient's leg to elevated foot and take pressure off of the affected area of the right heel. Informed patient the pain pill would be available to take around 1345. Patient verbalized understanding.

## 2023-01-12 NOTE — ED Notes (Signed)
 Cleaned up patient. Patient unaware when she makes a bowel movement. Had lots of stool, changed purwick due to stool. Patient's sheets changed, given a bed bath and new socks and gown. Assisted by Natalia Leatherwood, NT.

## 2023-01-12 NOTE — ED Notes (Signed)
 Assumed care of patient. Patient currently resting comfortably in bed with no signs of acute distress noted. Patient has no complaints at this time.

## 2023-01-12 NOTE — ED Notes (Addendum)
 Granddaughters at bedside visiting with patient.

## 2023-01-12 NOTE — Progress Notes (Signed)
 Auth pending for [employer].

## 2023-01-13 NOTE — ED Notes (Signed)
 Patient resting in bed breathing eyes closed

## 2023-01-13 NOTE — Progress Notes (Signed)
 Physical Therapy Treatment Patient Details Name: Susan [name] MRN: [id number] DOB: [birth year] Today's Date: 01/13/2023   History of Present Illness 83 year old female presents to Ed 01/08/23 with  concern for bleeding around Left small toe nail, right heel ulcer, progressive, generalized weakness over the last few months, chest pain, nondisplaced fracture of right 5th MT. PMH: dementia, hyperlipidemia, hemiparesis secondary to CVA, dysarthria, atrial fibrillation on Eliquis , chronic diastolic congestive heart failure    PT Comments  Patient has a very painful wound on medial heel.   Patient  requires max assistance for mobility to sitting and  back to supine.  Attempted to stand at RW x 2 but not able  to power up to stand. .Patient will benefit from continued inpatient follow up therapy, <3 hours/day      If plan is discharge home, recommend the following: Two people to help with walking and/or transfers;A lot of help with bathing/dressing/bathroom;Assist for transportation;Help with stairs or ramp for entrance   Can travel by private vehicle     No  Equipment Recommendations    none   Recommendations for Other Services       Precautions / Restrictions Precautions Precautions: Fall Precaution Comments: sore medial right heel very painful Other Brace: post op shoe on right(5th MT fx) and right heel sore Restrictions Weight Bearing Restrictions Per Provider Order: No     Mobility  Bed Mobility   Bed Mobility: Supine to Sit, Sit to Supine     Supine to sit: Max assist Sit to supine: Max assist   General bed mobility comments: patient initiates moving the legs toward bed edge, required max assist to for legs over bed edge, max support to lift  trunk to sitting, Max support to return legs onto stretcher.    Transfers Overall transfer level: Needs assistance Equipment used: Rolling walker (2 wheels)               General transfer comment: unable to stand and clear  buttocks from bed. Attempted x 2    Ambulation/Gait                   Stairs             Wheelchair Mobility     Tilt Bed    Modified Rankin (Stroke Patients Only)       Balance Overall balance assessment: Needs assistance Sitting-balance support: Bilateral upper extremity supported, Feet supported Sitting balance-Leahy Scale: Poor Sitting balance - Comments: tends to lean backwards       Standing balance comment: unable                            Cognition Arousal: Alert Behavior During Therapy: WFL for tasks assessed/performed Overall Cognitive Status: History of cognitive impairments - at baseline                                          Exercises      General Comments        Pertinent Vitals/Pain Pain Assessment Faces Pain Scale: Hurts whole lot Pain Location: right  foot, right side ribs Pain Descriptors / Indicators: Discomfort, Guarding, Grimacing    Home Living                          Prior Function  PT Goals (current goals can now be found in the care plan section) Progress towards PT goals: Progressing toward goals    Frequency    Min 1X/week      PT Plan      Co-evaluation              AM-PAC PT 6 Clicks Mobility   Outcome Measure  Help needed turning from your back to your side while in a flat bed without using bedrails?: Total Help needed moving from lying on your back to sitting on the side of a flat bed without using bedrails?: Total Help needed moving to and from a bed to a chair (including a wheelchair)?: Total Help needed standing up from a chair using your arms (e.g., wheelchair or bedside chair)?: Total Help needed to walk in hospital room?: Total Help needed climbing 3-5 steps with a railing? : Total 6 Click Score: 6    End of Session   Activity Tolerance: Patient tolerated treatment well Patient left: in bed;with call bell/phone within  reach;with family/visitor present Nurse Communication: Mobility status;Need for lift equipment PT Visit Diagnosis: Other abnormalities of gait and mobility (R26.89)     Time: 0940-1007 PT Time Calculation (min) (ACUTE ONLY): 27 min  Charges:    $Therapeutic Activity: 23-37 mins PT General Charges $$ ACUTE PT VISIT: 1 Visit                     Darice Potters PT Acute Rehabilitation Services Office [phone] Weekend [phone]    Potters Darice Norris 01/13/2023, 1:36 PM

## 2023-01-13 NOTE — ED Notes (Signed)
 Patient resting in bed watching TV

## 2023-01-13 NOTE — Progress Notes (Signed)
 Auth pending for [employer].

## 2023-01-13 NOTE — TOC CM/SW Note (Addendum)
 CM received notification that insurance has been denied after a peer to peer.  CM spoke with patient's daughter, Aurilla Coulibaly, and provided updated.  Mrs Haigh reports she will speak with her sister about next steps but is certain that she wished to appeal the denial.  Mrs Benning also requests private pay rates for facility.  [facility] rep, semi private room is 10050$ per month and private room is 11400$. CM awaiting for denial letter to generate on the portal, to be provided to daughter to initiate an appeal.   Addendum 1520 pm: CM followed up with [name] Eck and provided expedited appeal number [phone], denial auth ID [id number].

## 2023-01-13 NOTE — ED Notes (Signed)
 PT CANNOT FEED HER SELF.

## 2023-01-13 NOTE — ED Provider Notes (Signed)
 Emergency Medicine Observation Re-evaluation Note  Susan [name] is a 83 y.o. female, seen on rounds today.  Pt initially presented to the ED for complaints of Toe Injury and Weakness Currently, the patient is resting comfortably.  Physical Exam  BP (!) 163/67   Pulse 62   Temp 98.5 F (36.9 C)   Resp 16   Ht 5' 1 (1.549 m)   Wt 72.6 kg   SpO2 95%   BMI 30.23 kg/m  Physical Exam General: NAD   ED Course / MDM  EKG:EKG Interpretation Date/Time:  Wednesday January 08 2023 14:19:29 EST Ventricular Rate:  48 PR Interval:  199 QRS Duration:  177 QT Interval:  462 QTC Calculation: 413 R Axis:   -63  Text Interpretation: Sinus bradycardia Atrial premature complex RBBB and LAFB Left ventricular hypertrophy No significant change since last tracing Confirmed by Dreama Longs ([id number]) on 01/08/2023 3:44:42 PM  I have reviewed the labs performed to date as well as medications administered while in observation.  Recent changes in the last 24 hours include no acute events reported.  Plan  Current plan is for placement.    Laurice Maude BROCKS, MD 01/13/23 [phone]

## 2023-01-13 NOTE — ED Provider Notes (Signed)
 Peer-to-peer completed with medical director with [facility].  SNF placement declined.   Wynetta Fines, MD 01/13/23 1415

## 2023-01-13 NOTE — Progress Notes (Signed)
 CSW received call from [facility] informing Medical Director is offering a peer to peer for this member for SNF. Deadline is 4:30 PM today. [phone], option 5. EDP will need name, DOB, and Member ID: [id number].  CSW informed EDP via secure chat.

## 2023-01-14 NOTE — ED Provider Notes (Signed)
  Physical Exam  BP (!) 145/84 (BP Location: Left Arm)   Pulse 75   Temp 98.6 F (37 C) (Oral)   Resp 18   Ht 1.549 m (5' 1)   Wt 72.6 kg   SpO2 99%   BMI 30.23 kg/m   Physical Exam  Procedures  Procedures  ED Course / MDM    Medical Decision Making Amount and/or Complexity of Data Reviewed Labs: ordered. Radiology: ordered.  Risk OTC drugs. Prescription drug management.   83 year old female patient is sleeping at that time. She is for placement in skilled nursing facility Labs reviewed from January 1 with mild anemia hemoglobin 11.4 Held hypokalemia potassium 3.3 Stable CKD with creatinine 1.65      Levander Houston, MD 01/14/23 [phone]

## 2023-01-15 NOTE — Progress Notes (Signed)
 CSW spoke with pt's daughter, [name], who expressed concern for EDP not being fully aware of pt's medical history and therefore unable to determine whether pt should admit to SNF. CSW informed [name] that the EDP does not make the determination and encouraged her to contact pt's insurance to express concerns. As noted previously, pt's daughter has chosen to appeal denial from [facility] for SNF. Daughter reports she has read literature and states that even people who are wheelchair bound can improve.   Appeal is still in process.

## 2023-01-15 NOTE — ED Provider Notes (Signed)
 Emergency Medicine Observation Re-evaluation Note  Susan [name] is a 83 y.o. female, seen on rounds today.  Pt initially presented to the ED for complaints of Toe Injury and Weakness Currently, the patient is resting comfortably.  Physical Exam  BP (!) 150/72 (BP Location: Right Arm)   Pulse 69   Temp 98.2 F (36.8 C) (Oral)   Resp 18   Ht 5' 1 (1.549 m)   Wt 72.6 kg   SpO2 100%   BMI 30.23 kg/m  Physical Exam General: No acute distress Cardiac: Regular rate Lungs: No respiratory distress Psych: Currently calm  ED Course / MDM  EKG:EKG Interpretation Date/Time:  Wednesday January 08 2023 14:19:29 EST Ventricular Rate:  48 PR Interval:  199 QRS Duration:  177 QT Interval:  462 QTC Calculation: 413 R Axis:   -63  Text Interpretation: Sinus bradycardia Atrial premature complex RBBB and LAFB Left ventricular hypertrophy No significant change since last tracing Confirmed by Dreama Longs ([id number]) on 01/08/2023 3:44:42 PM  I have reviewed the labs performed to date as well as medications administered while in observation.  Recent changes in the last 24 hours include -no new changes.  It appears that patient's family is appealing the denial from insurance company.  Plan  Current plan is for holding patient for placement.    Charlyn Sora, MD 01/15/23 1023

## 2023-01-15 NOTE — Progress Notes (Signed)
 CSW received call from pt's daughter, Dwayne Essex. CSW provided update that the appeal is still in process. Dwayne inquired about a text message from [employer] about DME ordered by [facility] and CSW encouraged family to follow up with PCP. CSW explained that if appeal is upheld, options are private paying for SNF or returning pt home with [facility] services; however, CSW did note pt's insurance may be a barrier to securing services. Daughter verbalized understanding of all information explained.

## 2023-01-16 ENCOUNTER — Emergency Department (HOSPITAL_COMMUNITY): Payer: Medicare Other

## 2023-01-16 MED ORDER — DOXYCYCLINE HYCLATE 100 MG PO CAPS: 100.0000 mg | ORAL_CAPSULE | Freq: Two times a day (BID) | ORAL | 0 refills | Status: AC

## 2023-01-16 MED ORDER — CEPHALEXIN 500 MG PO CAPS: 500.0000 mg | ORAL_CAPSULE | Freq: Two times a day (BID) | ORAL | 0 refills | Status: DC

## 2023-01-16 MED ORDER — CEPHALEXIN 500 MG PO CAPS: 500.0000 mg | ORAL_CAPSULE | Freq: Two times a day (BID) | ORAL | 0 refills | Status: AC

## 2023-01-16 NOTE — ED Provider Notes (Addendum)
 Emergency Medicine Observation Re-evaluation Note  Susan [name] is a 83 y.o. female, seen on rounds today.  Pt initially presented to the ED for complaints of Toe Injury and Weakness Currently, the patient is resting comfortably  Physical Exam  BP (!) 154/70 (BP Location: Right Arm)   Pulse 74   Temp 98.6 F (37 C) (Oral)   Resp 18   Ht 5' 1 (1.549 m)   Wt 72.6 kg   SpO2 100%   BMI 30.23 kg/m  Physical Exam General: Resting comfortably Cardiac: RR Lungs: Normal WOB Psych: Calm MSK: RLE bandaged and in boot. Recently changed by wound care; does have foul odor  ED Course / MDM  EKG:EKG Interpretation Date/Time:  Wednesday January 08 2023 14:19:29 EST Ventricular Rate:  48 PR Interval:  199 QRS Duration:  177 QT Interval:  462 QTC Calculation: 413 R Axis:   -63  Text Interpretation: Sinus bradycardia Atrial premature complex RBBB and LAFB Left ventricular hypertrophy No significant change since last tracing Confirmed by Dreama Longs ([id number]) on 01/08/2023 3:44:42 PM  I have reviewed the labs performed to date as well as medications administered while in observation.  Recent changes in the last 24 hours include evalated by wound care.  Plan  Current plan is for placement; family appealing insurance denial. Patient was evaluated by wound care who noted foul odor and purulence to RLE and recommending MRI to eval for osteomyelitis; Ordered. Vitals stable and not indicative of systemic infection.     Neysa Caron PARAS, DO 01/16/23 1045    Neysa Caron PARAS, DO 01/16/23 1341

## 2023-01-16 NOTE — ED Notes (Signed)
 Ptar called per Child psychotherapist for transport to [employer]

## 2023-01-16 NOTE — Progress Notes (Signed)
 [facility] called. Daughter and [place] in Admissions aware. RN provided report info. No further TOC needs.

## 2023-01-16 NOTE — Consult Note (Addendum)
 WOC Nurse Consult Note: this patient in ED for R heel wound and other issues; had x-rays of R heel that showed no signs of osteomyelitis  Reason for Consult: R heel wound, gluteal wound  Wound type: 1.  Unstageable Pressure Injury R medial heel 2.  Deep Tissue Pressure Injury L great toe 3.  Stage 2 Pressure Injury sacrum/R upper buttock  Pressure Injury POA: NA  Measurement: 1.  R medial heel 4 cm x 5 cm eschar with 1 cm x 2 cm opening covered with brown necrotic tissue; wound is foul smelling, appears to have soft tissue destruction underlying eschar  2.  L great toe (distal aspect) 2 cm x 2 cm DTPI purple maroon discoloration  3.  Stage 2 PI R upper buttock/sacrum 0.5 cm x 0.5 cm x 0.1 cm 100% pink moist  Wound bed: as above  Drainage (amount, consistency, odor) L great toe dry, R heel with  foul smell no real drainage noted at the time of this visit although ? Fluctuance; Stage 2 R buttock minimal serosanguinous  Periwound: L great toe noted to have 2nd digit overlapping; others intact  Dressing procedure/placement/frequency:  Clean R medial heel with Vashe wound cleanser Soila [phone]), apply Vashe moistened gauze to wound bed then cover with Xeroform gauze Soila [phone]). Cover with dry gauze  and kerlix roll gauze. Place foot in a Prevalon boot to offload pressure.  Paint distal L great toe with Betadine  2 times daily and allow to air dry.  Continue with floor stock (purple top) skin protectant to sacrum/buttocks. Cover with silicone foam.  Lift foam daily to assess Stage 2 daily. Re-consult WOC if develops necrotic tissue (brown/black/tan/yellow) for further wound care orders.   POC discussed with primary nurse.  Patient would benefit from further imaging (CT/MRI) to R heel and possible consult to orthopedics/podiatry.  Will secure chat rounding MD regarding this.   WOC team will not follow. RE-consult if further needs arise.   Thank you,    Powell Bar MSN, RN-BC,  [employer] [phone]

## 2023-01-16 NOTE — Progress Notes (Addendum)
 CSW received call from [facility]. Pt's appeal was overturned. Pt approved for SNF. Daughter, [name], aware. CSW has outreached to [facility] at [facility] to see if pt can admit today.   Auth ID: [id number] Ref #: [id number]  Addend @ 12:00 PM CSW called Emmalene Hertz to inquire about admit date. Unable to leave a vm. Will follow up.

## 2023-01-16 NOTE — ED Notes (Signed)
 Patient resting in bed calm and cooperative. Clean up linen changed.

## 2023-01-28 NOTE — Progress Notes (Signed)
ok 

## 2023-02-27 ENCOUNTER — Emergency Department (HOSPITAL_BASED_OUTPATIENT_CLINIC_OR_DEPARTMENT_OTHER): Payer: Medicare Other

## 2023-02-27 ENCOUNTER — Inpatient Hospital Stay (HOSPITAL_BASED_OUTPATIENT_CLINIC_OR_DEPARTMENT_OTHER)
Admission: EM | Admit: 2023-02-27 | Discharge: 2023-03-11 | DRG: 689 | Disposition: A | Discharge status: Skilled Nursing Facility | Payer: Medicare Other | Source: Skilled Nursing Facility | Attending: Family Medicine | Admitting: Family Medicine

## 2023-02-27 ENCOUNTER — Other Ambulatory Visit: Payer: Self-pay

## 2023-02-27 DIAGNOSIS — Z823 Family history of stroke: Secondary | ICD-10-CM

## 2023-02-27 DIAGNOSIS — Z8711 Personal history of peptic ulcer disease: Secondary | ICD-10-CM

## 2023-02-27 DIAGNOSIS — Z9181 History of falling: Secondary | ICD-10-CM

## 2023-02-27 DIAGNOSIS — Z833 Family history of diabetes mellitus: Secondary | ICD-10-CM

## 2023-02-27 DIAGNOSIS — B9620 Unspecified Escherichia coli [E. coli] as the cause of diseases classified elsewhere: Secondary | ICD-10-CM | POA: Diagnosis present

## 2023-02-27 DIAGNOSIS — Z801 Family history of malignant neoplasm of trachea, bronchus and lung: Secondary | ICD-10-CM | POA: Diagnosis not present

## 2023-02-27 DIAGNOSIS — Z87891 Personal history of nicotine dependence: Secondary | ICD-10-CM

## 2023-02-27 DIAGNOSIS — Z96653 Presence of artificial knee joint, bilateral: Secondary | ICD-10-CM | POA: Diagnosis present

## 2023-02-27 DIAGNOSIS — N1832 Chronic kidney disease, stage 3b: Secondary | ICD-10-CM | POA: Diagnosis present

## 2023-02-27 DIAGNOSIS — I5032 Chronic diastolic (congestive) heart failure: Secondary | ICD-10-CM | POA: Diagnosis present

## 2023-02-27 DIAGNOSIS — E860 Dehydration: Secondary | ICD-10-CM | POA: Diagnosis present

## 2023-02-27 DIAGNOSIS — R531 Weakness: Secondary | ICD-10-CM

## 2023-02-27 DIAGNOSIS — N3000 Acute cystitis without hematuria: Principal | ICD-10-CM | POA: Diagnosis present

## 2023-02-27 DIAGNOSIS — G9341 Metabolic encephalopathy: Secondary | ICD-10-CM | POA: Diagnosis present

## 2023-02-27 DIAGNOSIS — I4819 Other persistent atrial fibrillation: Secondary | ICD-10-CM | POA: Diagnosis present

## 2023-02-27 DIAGNOSIS — I130 Hypertensive heart and chronic kidney disease with heart failure and stage 1 through stage 4 chronic kidney disease, or unspecified chronic kidney disease: Secondary | ICD-10-CM | POA: Diagnosis present

## 2023-02-27 DIAGNOSIS — Z8249 Family history of ischemic heart disease and other diseases of the circulatory system: Secondary | ICD-10-CM | POA: Diagnosis not present

## 2023-02-27 DIAGNOSIS — Z91040 Latex allergy status: Secondary | ICD-10-CM

## 2023-02-27 DIAGNOSIS — E785 Hyperlipidemia, unspecified: Secondary | ICD-10-CM | POA: Diagnosis present

## 2023-02-27 DIAGNOSIS — I69351 Hemiplegia and hemiparesis following cerebral infarction affecting right dominant side: Secondary | ICD-10-CM

## 2023-02-27 DIAGNOSIS — L89613 Pressure ulcer of right heel, stage 3: Secondary | ICD-10-CM | POA: Diagnosis present

## 2023-02-27 DIAGNOSIS — Z803 Family history of malignant neoplasm of breast: Secondary | ICD-10-CM | POA: Diagnosis not present

## 2023-02-27 DIAGNOSIS — Z7951 Long term (current) use of inhaled steroids: Secondary | ICD-10-CM

## 2023-02-27 DIAGNOSIS — D631 Anemia in chronic kidney disease: Secondary | ICD-10-CM | POA: Diagnosis present

## 2023-02-27 DIAGNOSIS — R197 Diarrhea, unspecified: Secondary | ICD-10-CM | POA: Diagnosis not present

## 2023-02-27 DIAGNOSIS — Z888 Allergy status to other drugs, medicaments and biological substances status: Secondary | ICD-10-CM | POA: Diagnosis not present

## 2023-02-27 DIAGNOSIS — Z7901 Long term (current) use of anticoagulants: Secondary | ICD-10-CM | POA: Diagnosis not present

## 2023-02-27 DIAGNOSIS — L89899 Pressure ulcer of other site, unspecified stage: Secondary | ICD-10-CM | POA: Diagnosis not present

## 2023-02-27 DIAGNOSIS — F0150 Vascular dementia without behavioral disturbance: Secondary | ICD-10-CM | POA: Diagnosis present

## 2023-02-27 DIAGNOSIS — Z79899 Other long term (current) drug therapy: Secondary | ICD-10-CM

## 2023-02-27 LAB — URINALYSIS, W/ REFLEX TO CULTURE (INFECTION SUSPECTED)
Bilirubin Urine: NEGATIVE
Glucose, UA: NEGATIVE mg/dL
Ketones, ur: NEGATIVE mg/dL
Nitrite: NEGATIVE
Protein, ur: 30 mg/dL — AB
RBC / HPF: >50 RBC/hpf (ref 0–5)
Specific Gravity, Urine: 1.015 (ref 1.005–1.030)
WBC, UA: >50 WBC/hpf (ref 0–5)
pH: 5.5 (ref 5.0–8.0)

## 2023-02-27 LAB — CBC WITH DIFFERENTIAL/PLATELET
Abs Immature Granulocytes: 0.02 10*3/uL (ref 0.00–0.07)
Basophils Absolute: 0.1 10*3/uL (ref 0.0–0.1)
Basophils Relative: 1 %
Eosinophils Absolute: 0.2 10*3/uL (ref 0.0–0.5)
Eosinophils Relative: 2 %
HCT: 36.2 % (ref 36.0–46.0)
Hemoglobin: 11.7 g/dL — ABNORMAL LOW (ref 12.0–15.0)
Immature Granulocytes: 0 %
Lymphocytes Relative: 9 %
Lymphs Abs: 0.8 10*3/uL (ref 0.7–4.0)
MCH: 31 pg (ref 26.0–34.0)
MCHC: 32.3 g/dL (ref 30.0–36.0)
MCV: 96 fL (ref 80.0–100.0)
Monocytes Absolute: 0.8 10*3/uL (ref 0.1–1.0)
Monocytes Relative: 9 %
Neutro Abs: 6.6 10*3/uL (ref 1.7–7.7)
Neutrophils Relative %: 79 %
Platelets: 254 10*3/uL (ref 150–400)
RBC: 3.77 MIL/uL — ABNORMAL LOW (ref 3.87–5.11)
RDW: 13.3 % (ref 11.5–15.5)
WBC: 8.5 10*3/uL (ref 4.0–10.5)
nRBC: 0 % (ref 0.0–0.2)

## 2023-02-27 LAB — COMPREHENSIVE METABOLIC PANEL
ALT: 12 U/L (ref 0–44)
AST: 17 U/L (ref 15–41)
Albumin: 4.4 g/dL (ref 3.5–5.0)
Alkaline Phosphatase: 74 U/L (ref 38–126)
Anion gap: 9 (ref 5–15)
BUN: 35 mg/dL — ABNORMAL HIGH (ref 8–23)
CO2: 29 mmol/L (ref 22–32)
Calcium: 10.9 mg/dL — ABNORMAL HIGH (ref 8.9–10.3)
Chloride: 100 mmol/L (ref 98–111)
Creatinine, Ser: 1.49 mg/dL — ABNORMAL HIGH (ref 0.44–1.00)
GFR, Estimated: 34 mL/min — ABNORMAL LOW (ref >60)
Glucose, Bld: 97 mg/dL (ref 70–99)
Potassium: 3.5 mmol/L (ref 3.5–5.1)
Sodium: 138 mmol/L (ref 135–145)
Total Bilirubin: 0.6 mg/dL (ref 0.0–1.2)
Total Protein: 7.7 g/dL (ref 6.5–8.1)

## 2023-02-27 LAB — LACTIC ACID, PLASMA: Lactic Acid, Venous: 0.7 mmol/L (ref 0.5–1.9)

## 2023-02-27 MED ORDER — MORPHINE SULFATE (PF) 4 MG/ML IV SOLN
4.0000 mg | Freq: Once | INTRAVENOUS | Status: AC
  Administered 2023-02-27: 4 mg via INTRAVENOUS
  Filled 2023-02-27: qty 1

## 2023-02-27 MED ORDER — SODIUM CHLORIDE 0.9 % IV SOLN
1.0000 g | Freq: Once | INTRAVENOUS | Status: AC
  Administered 2023-02-27: 1 g via INTRAVENOUS
  Filled 2023-02-27: qty 10

## 2023-02-27 NOTE — ED Notes (Signed)
X2 sets of blood cultures obtained before any abx administration

## 2023-02-27 NOTE — ED Notes (Signed)
 Unable to obtain labs in triage

## 2023-02-27 NOTE — ED Notes (Addendum)
Pt bib [facility] from [facility] with c/o RT ankle pain r/t fall x 4 days pta. Also reports foul odor in urine. Endorses urinary freq, denies dysuria. Pressure injury on RT heel. Pt on thinners

## 2023-02-27 NOTE — ED Provider Notes (Signed)
[facility] EMERGENCY DEPARTMENT AT [facility] Provider Note   CSN: [id number] Arrival date & time: 02/27/23  4098     History  Chief complaint foot pain  Susan [name] is a 83 y.o. female.  HPI   Patient was recently seen in the emergency room in January with complaints of weakness and foot pain.  Patient ended up having an MRI on January 9.  It showed findings suggestive of cellulitis but no evidence of osteomyelitis.  Patient previously had x-rays on January 3 that showed a fracture of the distal shaft of the fifth metacarpal.  Patient was discharged from the emergency room to rehab.  Family states patient had a fall couple of days ago.  She has not been able to stand on her right foot without pain for the last couple days.  Patient had been walking prior to that but previously had used a wheelchair.  Physical therapy went to assess the patient today and they could not get her to stand she was sent to the ED for evaluation.  No definite fevers.  No vomiting.  Patient and family have not noticed any odor or increased drainage from her foot wound  Home Medications Prior to Admission medications   Medication Sig Start Date End Date Taking? Authorizing Provider  albuterol (VENTOLIN HFA) 108 (90 Base) MCG/ACT inhaler Inhale 1-2 puffs into the lungs every 6 (six) hours as needed for wheezing or shortness of breath.    [provider]  amLODipine (NORVASC) 10 MG tablet Take 1 tablet (10 mg total) by mouth daily. Patient not taking: Reported on 01/10/2023 10/31/22   Miguel Rota, MD  buPROPion (WELLBUTRIN XL) 150 MG 24 hr tablet Take 150 mg by mouth daily. 10/14/22   [provider]  busPIRone (BUSPAR) 15 MG tablet Take 15 mg by mouth 2 (two) times daily.    [provider]  Calcium Carb-Cholecalciferol (CALCIUM 600+D3) 600-10 MG-MCG TABS Take 1 tablet by mouth 2 (two) times daily with a meal.    [provider]  cetirizine (ZYRTEC) 10 MG tablet Take 10  mg by mouth daily. 06/20/21   [provider]  diclofenac sodium (VOLTAREN) 1 % GEL Apply 2 g topically 4 (four) times daily. Patient taking differently: Apply 2 g topically 4 (four) times daily as needed (for pain- affected sites). 05/25/18   Sharon Seller, NP  donepezil (ARICEPT) 10 MG tablet Take 1 tablet (10 mg total) by mouth at bedtime. For memory loss 05/25/18   Sharon Seller, NP  ELIQUIS 2.5 MG TABS tablet TAKE 1 TABLET BY MOUTH 2 TIMES DAILY 01/04/22   Tereso Newcomer T, PA-C  FEROSUL 325 (65 Fe) MG tablet Take 325 mg by mouth 2 (two) times daily. 06/21/21   [provider]  gabapentin (NEURONTIN) 100 MG capsule Take 1 capsule (100 mg total) by mouth 2 (two) times daily. Patient taking differently: Take 100 mg by mouth at bedtime. 10/30/22   Amin, Ankit C, MD  hydrALAZINE (APRESOLINE) 25 MG tablet TAKE 1 TABLET BY MOUTH 3 TIMES DAILY Patient taking differently: Take 25 mg by mouth 3 (three) times daily. 06/17/18   Sharon Seller, NP  losartan (COZAAR) 50 MG tablet TAKE 1 TABLET BY MOUTH EVERY DAY Patient taking differently: Take 50 mg by mouth daily. 08/07/18   Sharon Seller, NP  Menthol, Topical Analgesic, (BIOFREEZE) 4 % GEL Apply 3 oz topically 3 (three) times daily. For knee pain Patient taking differently: Apply 1 application  topically 3 (three) times daily as needed (knee pain). 09/25/17   Ngetich, Dinah C, NP  Multiple Vitamin (GNP ESSENTIAL ONE DAILY) TABS TAKE 1 TABLET BY MOUTH EVERY DAY Patient taking differently: Take 1 tablet by mouth daily after breakfast. 06/17/18   Sharon Seller, NP  Nutritional Supplements (ENSURE HIGH PROTEIN) LIQD Take 237 mLs by mouth See admin instructions. Drink 237 ml's by mouth one to two times a day    [provider]  pantoprazole (PROTONIX) 40 MG tablet Take 1 tablet (40 mg total) by mouth daily before breakfast. 10/30/22   Amin, Ankit C, MD  pravastatin (PRAVACHOL) 40 MG tablet TAKE 1 TABLET BY MOUTH  EVERY DAY Patient taking differently: Take 40 mg by mouth daily. 06/17/18   Sharon Seller, NP  sertraline (ZOLOFT) 100 MG tablet Take 100 mg by mouth daily.    [provider]  SYMBICORT 80-4.5 MCG/ACT inhaler Inhale 2 puffs into the lungs in the morning and at bedtime. 02/08/21   [provider]  TYLENOL 500 MG tablet Take 1,000 mg by mouth every 8 (eight) hours as needed for mild pain (pain score 1-3) or headache.    [provider]      Allergies    Ace inhibitors, Lipitor [atorvastatin], Simvastatin, Abilify [aripiprazole], and Latex    Review of Systems   Review of Systems  Physical Exam Updated Vital Signs BP (!) 164/75   Pulse 64   Temp 98.8 F (37.1 C) (Oral)   Resp 20   SpO2 100%  Physical Exam Vitals and nursing note reviewed.  Constitutional:      General: She is not in acute distress.    Appearance: She is well-developed.  HENT:     Head: Normocephalic and atraumatic.     Right Ear: External ear normal.     Left Ear: External ear normal.  Eyes:     General: No scleral icterus.       Right eye: No discharge.        Left eye: No discharge.     Conjunctiva/sclera: Conjunctivae normal.  Neck:     Trachea: No tracheal deviation.  Cardiovascular:     Rate and Rhythm: Normal rate and regular rhythm.  Pulmonary:     Effort: Pulmonary effort is normal. No respiratory distress.     Breath sounds: Normal breath sounds. No stridor. No wheezing or rales.  Abdominal:     General: Bowel sounds are normal. There is no distension.     Palpations: Abdomen is soft.     Tenderness: There is no abdominal tenderness. There is no guarding or rebound.  Musculoskeletal:        General: Tenderness present. No swelling or deformity.     Cervical back: Neck supple.     Comments: Tenderness palpation right heel, chronic wound noted left heel, malodorous odor noted, no specific drainage, tenderness palpation left heel as well as mildly in the left middle  and lateral malleolus  Skin:    General: Skin is warm and dry.     Findings: No rash.  Neurological:     Mental Status: She is alert.     Cranial Nerves: No cranial nerve deficit, dysarthria or facial asymmetry.     Sensory: No sensory deficit.     Motor: Weakness present. No abnormal muscle tone or seizure activity.     Coordination: Coordination normal.  Psychiatric:        Mood and Affect: Mood normal.  ED Results / Procedures / Treatments   Labs (all labs ordered are listed, but only abnormal results are displayed) Labs Reviewed  COMPREHENSIVE METABOLIC PANEL - Abnormal; Notable for the following components:      Result Value   BUN 35 (*)    Creatinine, Ser 1.49 (*)    Calcium 10.9 (*)    GFR, Estimated 34 (*)    All other components within normal limits  CBC WITH DIFFERENTIAL/PLATELET - Abnormal; Notable for the following components:   RBC 3.77 (*)    Hemoglobin 11.7 (*)    All other components within normal limits  URINALYSIS, W/ REFLEX TO CULTURE (INFECTION SUSPECTED) - Abnormal; Notable for the following components:   APPearance HAZY (*)    Hgb urine dipstick MODERATE (*)    Protein, ur 30 (*)    Leukocytes,Ua LARGE (*)    Bacteria, UA RARE (*)    All other components within normal limits  URINE CULTURE  LACTIC ACID, PLASMA    EKG None  Radiology CT Head Wo Contrast Result Date: 02/27/2023 CLINICAL DATA:  Head trauma, minor (Age >= 65y).  Fall. EXAM: CT HEAD WITHOUT CONTRAST TECHNIQUE: Contiguous axial images were obtained from the base of the skull through the vertex without intravenous contrast. RADIATION DOSE REDUCTION: This exam was performed according to the departmental dose-optimization program which includes automated exposure control, adjustment of the mA and/or kV according to patient size and/or use of iterative reconstruction technique. COMPARISON:  01/08/2023 FINDINGS: Brain: There is atrophy and chronic small vessel disease changes. No acute  intracranial abnormality. Specifically, no hemorrhage, hydrocephalus, mass lesion, acute infarction, or significant intracranial injury. Vascular: No hyperdense vessel or unexpected calcification. Skull: No acute calvarial abnormality. Sinuses/Orbits: No acute findings Other: None IMPRESSION: Atrophy, chronic microvascular disease. No acute intracranial abnormality. Electronically Signed   By: Charlett Nose M.D.   On: 02/27/2023 20:21   DG Knee Complete 4 Views Right Result Date: 02/27/2023 CLINICAL DATA:  Fall with right knee pain. EXAM: RIGHT KNEE - COMPLETE 4+ VIEW COMPARISON:  Right knee radiographs 07/29/2012 FINDINGS: Redemonstration of total right knee arthroplasty. Interval removal of the prior anterior surgical skin staples and superior knee drain seen on prior remote 07/29/2012 radiographs following surgery. Small joint effusion, decreased from prior. Mild chronic enthesopathic change at the quadriceps insertion on the patella, similar to prior. No perihardware lucency is seen to indicate hardware failure or loosening. No acute fracture or dislocation. IMPRESSION: 1. 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 2. Small joint effusion, decreased from prior. Electronically Signed   By: Neita Garnet M.D.   On: 02/27/2023 17:49   DG Foot Complete Right Result Date: 02/27/2023 CLINICAL DATA:  Fall with pain.  Pressure injury on right heel. EXAM: RIGHT ANKLE - COMPLETE 3+ VIEW; RIGHT FOOT COMPLETE - 3+ VIEW COMPARISON:  Right foot radiographs 01/10/2023 FINDINGS: There is diffuse decreased bone mineralization. Right ankle: The ankle mortise is symmetric and intact. Tiny plantar calcaneal heel spur. Moderate calcaneocuboid, mild-to-moderate talonavicular, and mild-to-moderate navicular-cuneiform joint space narrowing, subchondral sclerosis, and dorsal osteophytosis. There is external material overlying the heel and lateral view. Within this limitation, no definite calcaneal cortical  erosion is seen. Small plantar calcaneal heel spur from better seen on 01/10/2023 comparison. Right foot: Interval high-grade healing of the previously seen oblique fracture of the distal shaft of the fifth metatarsal. Mild persistent fracture line lucency. IMPRESSION: 1. Interval high-grade healing of the previously seen oblique now subacute fracture of the distal shaft  of the fifth metatarsal. No acute fracture is seen. 2. 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 3. No definite calcaneal cortical erosion is seen. Electronically Signed   By: Neita Garnet M.D.   On: 02/27/2023 17:48   DG Ankle Complete Right Result Date: 02/27/2023 CLINICAL DATA:  Fall with pain.  Pressure injury on right heel. EXAM: RIGHT ANKLE - COMPLETE 3+ VIEW; RIGHT FOOT COMPLETE - 3+ VIEW COMPARISON:  Right foot radiographs 01/10/2023 FINDINGS: There is diffuse decreased bone mineralization. Right ankle: The ankle mortise is symmetric and intact. Tiny plantar calcaneal heel spur. Moderate calcaneocuboid, mild-to-moderate talonavicular, and mild-to-moderate navicular-cuneiform joint space narrowing, subchondral sclerosis, and dorsal osteophytosis. There is external material overlying the heel and lateral view. Within this limitation, no definite calcaneal cortical erosion is seen. Small plantar calcaneal heel spur from better seen on 01/10/2023 comparison. Right foot: Interval high-grade healing of the previously seen oblique fracture of the distal shaft of the fifth metatarsal. Mild persistent fracture line lucency. IMPRESSION: 1. Interval high-grade healing of the previously seen oblique now subacute fracture of the distal shaft of the fifth metatarsal. No acute fracture is seen. 2. 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 3. No definite calcaneal cortical erosion is seen. Electronically Signed    By: Neita Garnet M.D.   On: 02/27/2023 17:48    Procedures Procedures    Medications Ordered in ED Medications  cefTRIAXone (ROCEPHIN) 1 g in sodium chloride 0.9 % 100 mL IVPB (1 g Intravenous New Bag/Given 02/27/23 2110)  morphine (PF) 4 MG/ML injection 4 mg (4 mg Intravenous Given 02/27/23 1830)    ED Course/ Medical Decision Making/ A&P Clinical Course as of 02/27/23 2136  Thu Feb 27, 2023  1818 X-ray shows continued healing of the fifth metatarsal fracture, osteoarthritis noted in the foot [JK]  1819 Knee x-ray without acute finding [JK]  1947 CBC with Differential(!) CBC is normal.  Lactic acid level is normal. [JK]  1947 Comprehensive metabolic panel(!) Metabolic panel was normal. [JK]  2101 Urinalysis, w/ Reflex to Culture (Infection Suspected) -Urine, Catheterized(!) Urinalysis suggestive of infection [JK]  2107 CT Head Wo Contrast Head CT without acute findings. [JK]  2136 Case discussed with Dr. Haroldine Laws regarding admission [JK]    Clinical Course User Index [JK] Linwood Dibbles, MD                                 Medical Decision Making Problems Addressed: Acute cystitis without hematuria: acute illness or injury that poses a threat to life or bodily functions Pressure injury of skin of right foot, unspecified injury stage: chronic illness or injury Weakness: chronic illness or injury with exacerbation, progression, or side effects of treatment  Amount and/or Complexity of Data Reviewed Labs: ordered. Decision-making details documented in ED Course. Radiology: ordered and independent interpretation performed. Decision-making details documented in ED Course.  Risk Prescription drug management. Decision regarding hospitalization.   Patient presents to the ED with complaints of heel pain and increasing weakness.  She is also had urinary frequency and foul odor to her urine.  Patient had been in rehab recently and had been able to get around without a walker.  Seems  like patient has had general decline in her health.  She is having more difficulty we can but family states for the last couple of days she is not even able to transfer.  No signs of systemic infection.  There  is a wound on her heel but there is no evidence of osteomyelitis at this time.  Head CT does not show any signs of acute injury causing her gait disturbance.  Her x-rays do not show any new fractures but she does have a healing fracture in her right foot that certainly could be causing some of her pain.  Her heel wound could also be contributing to some of her discomfort as well.  Patient does appear to have a UTI.  With her increasing weakness I think she might benefit from overnight observation and assessment by PT.  I have ordered IV antibiotics.  I will consult with the hospitalist for admission.        Final Clinical Impression(s) / ED Diagnoses Final diagnoses:  Acute cystitis without hematuria  Weakness  Pressure injury of skin of right foot, unspecified injury stage    Rx / DC Orders ED Discharge Orders     None         Linwood Dibbles, MD 02/27/23 2136

## 2023-02-28 ENCOUNTER — Encounter (HOSPITAL_COMMUNITY): Payer: Self-pay | Admitting: Internal Medicine

## 2023-02-28 DIAGNOSIS — N3000 Acute cystitis without hematuria: Secondary | ICD-10-CM

## 2023-02-28 LAB — CBC
HCT: 34.8 % — ABNORMAL LOW (ref 36.0–46.0)
Hemoglobin: 11.1 g/dL — ABNORMAL LOW (ref 12.0–15.0)
MCH: 31.4 pg (ref 26.0–34.0)
MCHC: 31.9 g/dL (ref 30.0–36.0)
MCV: 98.6 fL (ref 80.0–100.0)
Platelets: 257 K/uL (ref 150–400)
RBC: 3.53 MIL/uL — ABNORMAL LOW (ref 3.87–5.11)
RDW: 13.5 % (ref 11.5–15.5)
WBC: 7.6 K/uL (ref 4.0–10.5)
nRBC: 0 % (ref 0.0–0.2)

## 2023-02-28 LAB — BASIC METABOLIC PANEL WITH GFR
Anion gap: 13 (ref 5–15)
BUN: 40 mg/dL — ABNORMAL HIGH (ref 8–23)
CO2: 26 mmol/L (ref 22–32)
Calcium: 10.1 mg/dL (ref 8.9–10.3)
Chloride: 100 mmol/L (ref 98–111)
Creatinine, Ser: 1.51 mg/dL — ABNORMAL HIGH (ref 0.44–1.00)
GFR, Estimated: 34 mL/min — ABNORMAL LOW
Glucose, Bld: 109 mg/dL — ABNORMAL HIGH (ref 70–99)
Potassium: 3.5 mmol/L (ref 3.5–5.1)
Sodium: 139 mmol/L (ref 135–145)

## 2023-02-28 LAB — PHOSPHORUS: Phosphorus: 3.3 mg/dL (ref 2.5–4.6)

## 2023-02-28 LAB — MAGNESIUM: Magnesium: 2.1 mg/dL (ref 1.7–2.4)

## 2023-02-28 MED ORDER — ACETAMINOPHEN 325 MG PO TABS
650.0000 mg | ORAL_TABLET | Freq: Four times a day (QID) | ORAL | Status: DC | PRN
  Administered 2023-02-28 – 2023-03-10 (×14): 650 mg via ORAL
  Filled 2023-02-28 (×14): qty 2

## 2023-02-28 MED ORDER — SODIUM CHLORIDE 0.9 % IV SOLN
1.0000 g | INTRAVENOUS | Status: AC
  Administered 2023-02-28 – 2023-03-03 (×4): 1 g via INTRAVENOUS
  Filled 2023-02-28 (×4): qty 10

## 2023-02-28 MED ORDER — MELATONIN 5 MG PO TABS
5.0000 mg | ORAL_TABLET | Freq: Every evening | ORAL | Status: DC | PRN
  Filled 2023-02-28: qty 1

## 2023-02-28 MED ORDER — LOSARTAN POTASSIUM 50 MG PO TABS
50.0000 mg | ORAL_TABLET | Freq: Every day | ORAL | Status: DC
  Administered 2023-02-28 – 2023-03-11 (×12): 50 mg via ORAL
  Filled 2023-02-28 (×12): qty 1

## 2023-02-28 MED ORDER — PRAVASTATIN SODIUM 40 MG PO TABS
40.0000 mg | ORAL_TABLET | Freq: Every day | ORAL | Status: DC
Start: 2023-02-28 — End: 2023-03-11
  Administered 2023-02-28 – 2023-03-11 (×12): 40 mg via ORAL
  Filled 2023-02-28 (×5): qty 2
  Filled 2023-02-28: qty 1
  Filled 2023-02-28 (×6): qty 2

## 2023-02-28 MED ORDER — LACTATED RINGERS IV SOLN: INTRAVENOUS | Status: AC

## 2023-02-28 MED ORDER — PROCHLORPERAZINE EDISYLATE 10 MG/2ML IJ SOLN: 5.0000 mg | Freq: Four times a day (QID) | INTRAMUSCULAR | Status: DC | PRN

## 2023-02-28 MED ORDER — POLYETHYLENE GLYCOL 3350 17 G PO PACK: 17.0000 g | PACK | Freq: Every day | ORAL | Status: DC | PRN

## 2023-02-28 MED ORDER — OXYCODONE HCL 5 MG PO TABS
5.0000 mg | ORAL_TABLET | Freq: Four times a day (QID) | ORAL | Status: AC | PRN
  Administered 2023-02-28 – 2023-03-01 (×3): 5 mg via ORAL
  Filled 2023-02-28 (×3): qty 1

## 2023-02-28 MED ORDER — APIXABAN 2.5 MG PO TABS
2.5000 mg | ORAL_TABLET | Freq: Two times a day (BID) | ORAL | Status: DC
  Administered 2023-02-28 – 2023-03-11 (×23): 2.5 mg via ORAL
  Filled 2023-02-28 (×24): qty 1

## 2023-02-28 NOTE — Plan of Care (Signed)
  Problem: Education: Goal: Knowledge of General Education information will improve Description: Including pain rating scale, medication(s)/side effects and non-pharmacologic comfort measures Outcome: Progressing   Problem: Nutrition: Goal: Adequate nutrition will be maintained Outcome: Progressing   Problem: Coping: Goal: Level of anxiety will decrease Outcome: Progressing   Problem: Elimination: Goal: Will not experience complications related to urinary retention Outcome: Progressing   Problem: Pain Managment: Goal: General experience of comfort will improve and/or be controlled Outcome: Progressing   Problem: Safety: Goal: Ability to remain free from injury will improve Outcome: Progressing

## 2023-02-28 NOTE — H&P (Addendum)
History and Physical  Susan [name] XBM:[id number] DOB: 09-11-1940 DOA: 02/27/2023  Referring physician: Accepted by Dr. Joneen Roach, [facility],  Hospitalist service. PCP: Raymon Mutton., FNP  Outpatient Specialists: Cardiology. Patient coming from: SNF  Chief Complaint: Generalized weakness with falls.  HPI: Susan [name] is a 83 y.o. female with medical history significant for chronic HFpEF, paroxysmal A-fib on Eliquis, hyperlipidemia, CKD 3B, anemia of chronic disease, fracture of the distal shaft of the fifth metacarpal from a fall in January 2025, who initially presented to [facility] ED from SNF via EMS due to generalized weakness contributing to another fall 2 days ago.  Reportedly she also had urinary frequency and foul odor to her urine for the past few days.  In the ER, right heel wound noted with no evidence of osteomyelitis on imaging, no new fractures.  She has a healing fracture in her right foot.  Head CT was nonacute.  UA was positive for pyuria.  Urine culture sent and the patient was started on empiric IV antibiotics, Rocephin.  TRH was asked to admit.  Accepted by Dr. Joneen Roach and transferred to [facility] MedSurg unit as inpatient status.  ED Course: Temperature 99.4.  BP 144/78, pulse 67, respiratory 16, saturation 93% on room air.  Review of Systems: Review of systems as noted in the HPI. All other systems reviewed and are negative.   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)    History of CVA (cerebrovascular accident) 02/01/2015   History of stomach ulcers 1980's   Hyperlipidemia    Hyperparathyroidism, primary (HCC) 04/22/2012   Hypertensive heart disease    Incontinent of urine    wears pads   Major depressive disorder, recurrent episode, 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   OSA on CPAP    Osteoarthritis of right knee    Pedal edema    Persistent atrial fibrillation (HCC)    CHADS2VASC score is 7 - on chronic anticoagulation with Apixaban   Spinal stenosis    Umbilical hernia    "unrepaired" (07/29/2012)   Varicose veins    "BLE" (07/29/2012)   Vascular dementia (HCC)    Venous stasis dermatitis of left lower extremity 04/29/2018   Past Surgical History:  Procedure Laterality Date   CATARACT EXTRACTION     COLONOSCOPY     ENDOVENOUS ABLATION SAPHENOUS VEIN W/ LASER Left 06/24/2019   endovenous laser ablation left greater saphenous vein by Cari Caraway MD    IUD REMOVAL  1980's   PARATHYROIDECTOMY N/A 06/16/2012   Procedure: NECK EXPLORATION AND LEFT SUPERIOR PARATHYROIDECTOMY;  Surgeon: [name], MD;  Location: [facility] ORS;  Service: General;  Laterality: N/A;   TOTAL KNEE ARTHROPLASTY Right 07/29/2012   TOTAL KNEE ARTHROPLASTY Right 07/29/2012   Procedure: TOTAL KNEE ARTHROPLASTY;  Surgeon: Loreta Ave, MD;  Location[facility] OR;  Service: Orthopedics;  Laterality: Right;   TOTAL KNEE  ARTHROPLASTY Left 12/13/2019   Procedure: LEFT TOTAL KNEE ARTHROPLASTY;  Surgeon: Tarry Kos, MD;  Location: [facility] OR;  Service: Orthopedics;  Laterality: Left;    Social History:  reports that she quit smoking about 45 years ago. Her smoking use included cigarettes. She started smoking about 75 years ago. She has a 30 pack-year smoking history. She has never used smokeless tobacco. She reports that she does not drink alcohol and does not use drugs.   Allergies  Allergen Reactions   Ace Inhibitors Cough   Lipitor  [Atorvastatin] Swelling   Simvastatin Other (See Comments)    Myalgias      Abilify [Aripiprazole] Other (See Comments)    Made her words "disconnected and caused tremors"   Latex Itching    Family History  Problem Relation Age of Onset   Hypertension Mother        deceased   Stroke Mother    Breast cancer Mother    Lung cancer Father        deceased   Diabetes Daughter    Hypertension Daughter       Prior to Admission medications   Medication Sig Start Date End Date Taking? Authorizing Provider  albuterol (VENTOLIN HFA) 108 (90 Base) MCG/ACT inhaler Inhale 1-2 puffs into the lungs every 6 (six) hours as needed for wheezing or shortness of breath.    [provider]  amLODipine (NORVASC) 10 MG tablet Take 1 tablet (10 mg total) by mouth daily. Patient not taking: Reported on 01/10/2023 10/31/22   Miguel Rota, MD  buPROPion (WELLBUTRIN XL) 150 MG 24 hr tablet Take 150 mg by mouth daily. 10/14/22   [provider]  busPIRone (BUSPAR) 15 MG tablet Take 15 mg by mouth 2 (two) times daily.    [provider]  Calcium Carb-Cholecalciferol (CALCIUM 600+D3) 600-10 MG-MCG TABS Take 1 tablet by mouth 2 (two) times daily with a meal.    [provider]  cetirizine (ZYRTEC) 10 MG tablet Take 10 mg by mouth daily. 06/20/21   [provider]  diclofenac sodium (VOLTAREN) 1 % GEL Apply 2 g topically 4 (four) times daily. Patient taking differently: Apply 2 g topically 4 (four) times daily as needed (for pain- affected sites). 05/25/18   Sharon Seller, NP  donepezil (ARICEPT) 10 MG tablet Take 1 tablet (10 mg total) by mouth at bedtime. For memory loss 05/25/18   Sharon Seller, NP  ELIQUIS 2.5 MG TABS tablet TAKE 1 TABLET BY MOUTH 2 TIMES DAILY 01/04/22   Tereso Newcomer T, PA-C  FEROSUL 325 (65 Fe) MG tablet Take 325 mg by mouth 2 (two) times daily. 06/21/21   [provider]  gabapentin (NEURONTIN) 100 MG capsule Take 1 capsule (100 mg  total) by mouth 2 (two) times daily. Patient taking differently: Take 100 mg by mouth at bedtime. 10/30/22   Amin, Ankit C, MD  hydrALAZINE (APRESOLINE) 25 MG tablet TAKE 1 TABLET BY MOUTH 3 TIMES DAILY Patient taking differently: Take 25 mg by mouth 3 (three) times daily. 06/17/18   Sharon Seller, NP  losartan (COZAAR) 50 MG tablet TAKE 1 TABLET BY MOUTH EVERY DAY Patient taking differently: Take 50 mg by mouth daily. 08/07/18   Sharon Seller, NP  Menthol, Topical Analgesic, (BIOFREEZE) 4 % GEL Apply 3 oz topically 3 (three) times daily. For knee pain Patient taking differently: Apply 1 application  topically 3 (three) times daily as needed (knee pain). 09/25/17   Ngetich, [employer]  C, NP  Multiple Vitamin (GNP ESSENTIAL ONE DAILY) TABS TAKE 1 TABLET BY MOUTH EVERY DAY Patient taking differently: Take 1 tablet by mouth daily after breakfast. 06/17/18   Sharon Seller, NP  Nutritional Supplements (ENSURE HIGH PROTEIN) LIQD Take 237 mLs by mouth See admin instructions. Drink 237 ml's by mouth one to two times a day    [provider]  pantoprazole (PROTONIX) 40 MG tablet Take 1 tablet (40 mg total) by mouth daily before breakfast. 10/30/22   Amin, Ankit C, MD  pravastatin (PRAVACHOL) 40 MG tablet TAKE 1 TABLET BY MOUTH EVERY DAY Patient taking differently: Take 40 mg by mouth daily. 06/17/18   Sharon Seller, NP  sertraline (ZOLOFT) 100 MG tablet Take 100 mg by mouth daily.    [provider]  SYMBICORT 80-4.5 MCG/ACT inhaler Inhale 2 puffs into the lungs in the morning and at bedtime. 02/08/21   [provider]  TYLENOL 500 MG tablet Take 1,000 mg by mouth every 8 (eight) hours as needed for mild pain (pain score 1-3) or headache.    [provider]    Physical Exam: BP (!) 144/78 (BP Location: Right Arm)   Pulse 67   Temp 99.4 F (37.4 C) (Oral)   Resp 16   Ht 5\' 1"  (1.549 m)   SpO2 93%   BMI 30.23 kg/m   General: 83 y.o. year-old female well  developed well nourished in no acute distress.  Alert and oriented x3. Cardiovascular: Regular rate and rhythm with no rubs or gallops.  No thyromegaly or JVD noted.  No lower extremity edema. 2/4 pulses in all 4 extremities. Respiratory: Clear to auscultation with no wheezes or rales. Good inspiratory effort. Abdomen: Soft nontender nondistended with normal bowel sounds x4 quadrants. Muskuloskeletal: No cyanosis, clubbing or edema noted bilaterally Neuro: CN II-XII intact, strength, sensation, reflexes Skin: Right heel wound, POA Psychiatry: Judgement and insight appear normal. Mood is appropriate for condition and setting          Labs on Admission:  Basic Metabolic Panel: Recent Labs  Lab 02/27/23 1739  NA 138  K 3.5  CL 100  CO2 29  GLUCOSE 97  BUN 35*  CREATININE 1.49*  CALCIUM 10.9*   Liver Function Tests: Recent Labs  Lab 02/27/23 1739  AST 17  ALT 12  ALKPHOS 74  BILITOT 0.6  PROT 7.7  ALBUMIN 4.4   No results for input(s): "LIPASE", "AMYLASE" in the last 168 hours. No results for input(s): "AMMONIA" in the last 168 hours. CBC: Recent Labs  Lab 02/27/23 1739  WBC 8.5  NEUTROABS 6.6  HGB 11.7*  HCT 36.2  MCV 96.0  PLT 254   Cardiac Enzymes: No results for input(s): "CKTOTAL", "CKMB", "CKMBINDEX", "TROPONINI" in the last 168 hours.  BNP (last 3 results) Recent Labs    01/08/23 1710  BNP 126.0*    ProBNP (last 3 results) No results for input(s): "PROBNP" in the last 8760 hours.  CBG: No results for input(s): "GLUCAP" in the last 168 hours.  Radiological Exams on Admission: CT Head Wo Contrast Result Date: 02/27/2023 CLINICAL DATA:  Head trauma, minor (Age >= 65y).  Fall. EXAM: CT HEAD WITHOUT CONTRAST TECHNIQUE: Contiguous axial images were obtained from the base of the skull through the vertex without intravenous contrast. RADIATION DOSE REDUCTION: This exam was performed according to the departmental dose-optimization program which includes  automated exposure control, adjustment of the mA and/or kV according to patient size and/or use of  iterative reconstruction technique. COMPARISON:  01/08/2023 FINDINGS: Brain: There is atrophy and chronic small vessel disease changes. No acute intracranial abnormality. Specifically, no hemorrhage, hydrocephalus, mass lesion, acute infarction, or significant intracranial injury. Vascular: No hyperdense vessel or unexpected calcification. Skull: No acute calvarial abnormality. Sinuses/Orbits: No acute findings Other: None IMPRESSION: Atrophy, chronic microvascular disease. No acute intracranial abnormality. Electronically Signed   By: Charlett Nose M.D.   On: 02/27/2023 20:21   DG Knee Complete 4 Views Right Result Date: 02/27/2023 CLINICAL DATA:  Fall with right knee pain. EXAM: RIGHT KNEE - COMPLETE 4+ VIEW COMPARISON:  Right knee radiographs 07/29/2012 FINDINGS: Redemonstration of total right knee arthroplasty. Interval removal of the prior anterior surgical skin staples and superior knee drain seen on prior remote 07/29/2012 radiographs following surgery. Small joint effusion, decreased from prior. Mild chronic enthesopathic change at the quadriceps insertion on the patella, similar to prior. No perihardware lucency is seen to indicate hardware failure or loosening. No acute fracture or dislocation. IMPRESSION: 1. 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 2. Small joint effusion, decreased from prior. Electronically Signed   By: Neita Garnet M.D.   On: 02/27/2023 17:49   DG Foot Complete Right Result Date: 02/27/2023 CLINICAL DATA:  Fall with pain.  Pressure injury on right heel. EXAM: RIGHT ANKLE - COMPLETE 3+ VIEW; RIGHT FOOT COMPLETE - 3+ VIEW COMPARISON:  Right foot radiographs 01/10/2023 FINDINGS: There is diffuse decreased bone mineralization. Right ankle: The ankle mortise is symmetric and intact. Tiny plantar calcaneal heel spur. Moderate calcaneocuboid, mild-to-moderate  talonavicular, and mild-to-moderate navicular-cuneiform joint space narrowing, subchondral sclerosis, and dorsal osteophytosis. There is external material overlying the heel and lateral view. Within this limitation, no definite calcaneal cortical erosion is seen. Small plantar calcaneal heel spur from better seen on 01/10/2023 comparison. Right foot: Interval high-grade healing of the previously seen oblique fracture of the distal shaft of the fifth metatarsal. Mild persistent fracture line lucency. IMPRESSION: 1. Interval high-grade healing of the previously seen oblique now subacute fracture of the distal shaft of the fifth metatarsal. No acute fracture is seen. 2. 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 3. No definite calcaneal cortical erosion is seen. Electronically Signed   By: Neita Garnet M.D.   On: 02/27/2023 17:48   DG Ankle Complete Right Result Date: 02/27/2023 CLINICAL DATA:  Fall with pain.  Pressure injury on right heel. EXAM: RIGHT ANKLE - COMPLETE 3+ VIEW; RIGHT FOOT COMPLETE - 3+ VIEW COMPARISON:  Right foot radiographs 01/10/2023 FINDINGS: There is diffuse decreased bone mineralization. Right ankle: The ankle mortise is symmetric and intact. Tiny plantar calcaneal heel spur. Moderate calcaneocuboid, mild-to-moderate talonavicular, and mild-to-moderate navicular-cuneiform joint space narrowing, subchondral sclerosis, and dorsal osteophytosis. There is external material overlying the heel and lateral view. Within this limitation, no definite calcaneal cortical erosion is seen. Small plantar calcaneal heel spur from better seen on 01/10/2023 comparison. Right foot: Interval high-grade healing of the previously seen oblique fracture of the distal shaft of the fifth metatarsal. Mild persistent fracture line lucency. IMPRESSION: 1. Interval high-grade healing of the previously seen oblique now subacute fracture of the distal  shaft of the fifth metatarsal. No acute fracture is seen. 2. 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 3. No definite calcaneal cortical erosion is seen. Electronically Signed   By: Neita Garnet M.D.   On: 02/27/2023 17:48    EKG: I independently viewed the EKG done and my findings are as followed: None available at the  time of this visit.  Assessment/Plan Present on Admission:  Acute cystitis without hematuria  Principal Problem:   Acute cystitis without hematuria  Presumptive UTI, POA UA positive for pyuria Follow urine culture for ID and sensitivities Continue Rocephin empirically Narrow down antibiotics when able  Generalized weakness, possibly contributed by presumed UTI High fall risk Continue to treat underlying condition PT OT assessment Fall precautions  CKD 3B Appears to be at her baseline creatinine and GFR Mild dehydration noted on exam Start gentle IV fluid hydration LR at 50 cc/h x 1 day. Monitor urine output Repeat BMP in the morning  Hypertension, BP is not at goal, elevated Resume home oral antihypertensives Monitor vital signs  Chronic HFpEF Last 2D echo done in 2021 revealed LVEF 55 to 60% with grade 1 diastolic dysfunction Closely monitor volume status while on IV fluid hydration Start strict I's and O's and daily weight  Hyperlipidemia Resume home pravastatin.  Anemia of chronic disease Hemoglobin is at baseline No reported overt bleeding.  Right heel wound, POA Wound care specialist consulted Continue local wound care As needed analgesics and as needed bowel regimen.   Time: 75 minutes.   DVT prophylaxis: Home Eliquis.  Code Status: Full code.  Family Communication: Granddaughter at bedside.  Disposition Plan: Admitted to MedSurg unit.  Consults called: None.  Admission status: Inpatient status.   Status is: Inpatient The patient requires at least 2  midnights for further evaluation and treatment of present condition.   Darlin Drop MD [facility] Pager [phone]  If 7PM-7AM, please contact night-coverage [email or web address] Password [facility]  02/28/2023, 12:21 AM

## 2023-02-28 NOTE — Evaluation (Signed)
Physical Therapy Evaluation Patient Details Name: Susan [name] MRN: [id number] DOB: 10/15/1940 Today's Date: 02/28/2023  History of Present Illness  Pt is 83 yo female admitted on 02/27/23 with weakness and falls.  Pt with presumptive UTI.  Pt with hx including but not limited to for chronic HFpEF, paroxysmal A-fib on Eliquis, hyperlipidemia, CKD 3B, anemia of chronic disease, fracture of the distal shaft of the fifth metacarpal from a fall in January 2025, and R heel wound.  Clinical Impression  Pt admitted with above diagnosis. Pt with recent baseline of only being able to transfer with assist.  She had recent SNF stay but reports had returned to ILF with caregivers 4 hr am and 4 hr pm during the week.  Today, pt very stiff and slow with all movements.  She required total A x 2/3 for lateral scoots/squat pivots. Pt currently with functional limitations due to the deficits listed below (see PT Problem List). Pt will benefit from acute skilled PT to increase their independence and safety with mobility to allow discharge.   Pt does appear to be below recent baseline and would need 24 hr care at d/c. Patient will benefit from continued inpatient follow up therapy, <3 hours/day at d/c.          If plan is discharge home, recommend the following: Two people to help with walking and/or transfers;Two people to help with bathing/dressing/bathroom   Can travel by private vehicle        Equipment Recommendations Other (comment) (defer to post acute)  Recommendations for Other Services       Functional Status Assessment Patient has had a recent decline in their functional status and demonstrates the ability to make significant improvements in function in a reasonable and predictable amount of time.     Precautions / Restrictions Precautions Precautions: Fall Restrictions Weight Bearing Restrictions Per Provider Order: No Other Position/Activity Restrictions: Per admission in January: Post op shoe  R foot but no weight bearing precautions      Mobility  Bed Mobility Overal bed mobility: Needs Assistance Bed Mobility: Supine to Sit     Supine to sit: Total assist, +2 for physical assistance          Transfers Overall transfer level: Needs assistance Equipment used: 2 person hand held assist Transfers: Sit to/from Stand, Bed to chair/wheelchair/BSC Sit to Stand: Total assist, +2 physical assistance     Squat pivot transfers: Total assist, +2 physical assistance    Lateral/Scoot Transfers: Total assist, +2 physical assistance General transfer comment: Started with lateral scoot from bed to drop arm recliner with total x 2 and cues.  Once positioned in chair, states needs to have BM.  Tried to find drop arm BSC but unable.  Used standard BSC and total x 3 pivot to and from with assist for ADL.  Did not have BM.  During tx required bil feet blocked to prevent ext - very little to no weight through legs    Ambulation/Gait                  Stairs            Wheelchair Mobility     Tilt Bed    Modified Rankin (Stroke Patients Only)       Balance Overall balance assessment: Needs assistance Sitting-balance support: No upper extremity supported Sitting balance-Leahy Scale: Fair Sitting balance - Comments: Preferred UE support but could sit with posterior tendency     Standing balance-Leahy Scale:  Zero Standing balance comment: could not stand upright                             Pertinent Vitals/Pain Pain Assessment Pain Assessment: Faces Faces Pain Scale: Hurts little more Pain Location: Bil LE Pain Descriptors / Indicators: Discomfort, Grimacing, Guarding (L leg spasms) Pain Intervention(s): Limited activity within patient's tolerance, Monitored during session    Home Living Family/patient expects to be discharged to:: Other (Comment) (Pt from [facility]) Living Arrangements: Alone Available Help at Discharge: Personal care  attendant;Other (Comment) (4hr in am and 4 hr in pm -during week; alone on weekend) Type of Home: Independent living facility Home Access: Elevator       Home Layout: One level Home Equipment: Shower seat;Grab bars - tub/shower;Rollator (4 wheels);Cane - single point;Wheelchair - manual      Prior Function Prior Level of Function : Needs assist             Mobility Comments: Did not ambulate; pivoted to w/c with assist holding onto RW ADLs Comments: requiring assistance for ADLs     Extremity/Trunk Assessment   Upper Extremity Assessment Upper Extremity Assessment: Defer to OT evaluation    Lower Extremity Assessment Lower Extremity Assessment: LLE deficits/detail;RLE deficits/detail RLE Deficits / Details: Pt very stiff throughout bil UE and LE and trunk.  Slowed movements.  ROM: bil knees near 90 degrees, ankles near neutral DF, hip WFL; MMT: limited testing due to pain but appears very weak and likely not greater than 3/5 LLE Deficits / Details: Pt very stiff throughout bil UE and LE and trunk. Slowed movements. ROM: bil knees near 90 degrees, ankles near neutral DF, hip WFL; MMT: limited testing due to pain but appears very weak and likely not greater than 3/5    Cervical / Trunk Assessment Cervical / Trunk Assessment: Kyphotic;Other exceptions Cervical / Trunk Exceptions: leans R, tilts head R, stiff  Communication   Communication Communication: No apparent difficulties    Cognition Arousal: Alert Behavior During Therapy: Flat affect   PT - Cognitive impairments: No family/caregiver present to determine baseline                       PT - Cognition Comments: increased time to respond Following commands: Intact       Cueing       General Comments General comments (skin integrity, edema, etc.): dressings on bil heels    Exercises     Assessment/Plan    PT Assessment Patient needs continued PT services  PT Problem List Decreased  strength;Pain;Decreased range of motion;Decreased cognition;Decreased activity tolerance;Decreased balance;Decreased mobility;Decreased knowledge of use of DME;Decreased safety awareness       PT Treatment Interventions DME instruction;Therapeutic exercise;Balance training;Neuromuscular re-education;Functional mobility training;Therapeutic activities;Patient/family education;Modalities;Manual techniques    PT Goals (Current goals can be found in the Care Plan section)  Acute Rehab PT Goals Patient Stated Goal: did not state PT Goal Formulation: With patient Time For Goal Achievement: 03/14/23 Potential to Achieve Goals: Fair    Frequency Min 1X/week     Co-evaluation               AM-PAC PT "6 Clicks" Mobility  Outcome Measure Help needed turning from your back to your side while in a flat bed without using bedrails?: Total Help needed moving from lying on your back to sitting on the side of a flat bed without using bedrails?: Total Help  needed moving to and from a bed to a chair (including a wheelchair)?: Total Help needed standing up from a chair using your arms (e.g., wheelchair or bedside chair)?: Total Help needed to walk in hospital room?: Total Help needed climbing 3-5 steps with a railing? : Total 6 Click Score: 6    End of Session Equipment Utilized During Treatment: Gait belt Activity Tolerance: Patient tolerated treatment well Patient left: with chair alarm set;in chair;with call bell/phone within reach (heels floated; maximove pad underpt) Nurse Communication: Mobility status;Need for lift equipment PT Visit Diagnosis: Other abnormalities of gait and mobility (R26.89);Muscle weakness (generalized) (M62.81)    Time: 1610-9604 PT Time Calculation (min) (ACUTE ONLY): 37 min   Charges:   PT Evaluation $PT Eval Moderate Complexity: 1 Mod   PT General Charges $$ ACUTE PT VISIT: 1 Visit         Anise Salvo, PT Acute Rehab [facility] Rehab  [phone]   Rayetta Humphrey 02/28/2023, 11:12 AM

## 2023-02-28 NOTE — TOC Initial Note (Addendum)
Transition of Care [facility]) - Initial/Assessment Note    Patient Details  Name: Susan [name] MRN: [id number] Date of Birth: [birth year]  Transition of Care [facility]) CM/SW Contact:    Howell Rucks, RN Phone Number: 02/28/2023, 10:06 AM  Clinical Narrative:   Met with pt at bedside to introduce role of TOC/NCM and review for dc planning, pt dozing off during assessment. NCM call to pt's dtr , Deniece Portela, introduce self and role of TOC/NCM. [name] reports pt currently lives alone with a HHA prn, reports she assist with pt's care but she is now unable to lift the patient and is requesting resources for ALF( request resources emailed to her at [email or web address]).Home. Reports pt recently discharged from short term rehab at [facility]. DME: Rollator, BSC, shower chair, grab bars in bathroom, hospital bed.   PT eval pending, await recommendation. TOC will continue to follow.         -20:22am. Contact information for [employer] emailed to pt's dtr, [name]) at [email or web address], to assist with options for placement.     -12:44 PT eval completed, recommendation for short term rehab/SNF. Text sent to pt's dtr, Deniece Portela, to confirm agreeable to recommendation, any facility preference, await response.FL2 updated, faxed out for bed offers ( pt's dtr agreeable to short term rehab/SNF on initial assessment)       -2:21pm Call received from [facility] with [facility], reports pt currently on service for [facility] PT.     Expected Discharge Plan: Home w Home Health Services Barriers to Discharge: Continued Medical Work up   Patient Goals and CMS Choice Patient states their goals for this hospitalization and ongoing recovery are:: return home          Expected Discharge Plan and Services       Living arrangements for the past 2 months: Apartment                                      Prior Living Arrangements/Services Living arrangements for the past 2 months: Apartment Lives  with:: Self Patient language and need for interpreter reviewed:: Yes        Need for Family Participation in Patient Care: Yes (Comment) Care giver support system in place?: Yes (comment) Current home services: DME (Rollator, BSC, shower chair, bathroom with grab bars, hospital bed) Criminal Activity/Legal Involvement Pertinent to Current Situation/Hospitalization: No - Comment as needed  Activities of Daily Living   ADL Screening (condition at time of admission) Independently performs ADLs?: No Does the patient have a NEW difficulty with bathing/dressing/toileting/self-feeding that is expected to last >3 days?: No Does the patient have a NEW difficulty with getting in/out of bed, walking, or climbing stairs that is expected to last >3 days?: Yes (Initiates electronic notice to provider for possible PT consult) Does the patient have a NEW difficulty with communication that is expected to last >3 days?: No Is the patient deaf or have difficulty hearing?: No Does the patient have difficulty seeing, even when wearing glasses/contacts?: No Does the patient have difficulty concentrating, remembering, or making decisions?: Yes  Permission Sought/Granted                  Emotional Assessment Appearance:: Appears stated age Attitude/Demeanor/Rapport: Gracious Affect (typically observed): Accepting Orientation: : Oriented to Self, Oriented to Place, Oriented to  Time, Oriented to Situation Alcohol / Substance Use: Not Applicable Psych Involvement: No (comment)  Admission diagnosis:  Weakness [R53.1] Acute cystitis without hematuria [N30.00] Pressure injury of skin of right foot, unspecified injury stage [L89.899] Patient Active Problem List   Diagnosis Date Noted   Acute cystitis without hematuria 02/27/2023   Fall at home, initial encounter 10/24/2022   Generalized weakness 10/24/2022   Troponin level elevated 10/24/2022   Hypercalcemia 10/24/2022   Sterile pyuria 10/24/2022    Chronic diastolic CHF (congestive heart failure) (HCC) 10/24/2022   Paroxysmal atrial fibrillation (HCC) 10/24/2022   AKI (acute kidney injury) (HCC) 10/24/2022   Acute renal failure superimposed on stage 3b chronic kidney disease (HCC) 10/23/2022   Preoperative cardiovascular examination 07/08/2021   Suicidal ideation 07/05/2021   Acute pain of left knee 03/01/2020   Hx of total knee replacement, left 02/01/2020   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   Anemia of chronic disease 09/07/2018   Primary osteoarthritis of left knee 06/16/2018   Venous stasis dermatitis of left lower extremity 04/29/2018   Dementia (HCC) 03/12/2018   Recurrent cellulitis 02/12/2018   Essential hypertension 02/12/2018   Cellulitis and abscess of leg 12/29/2017   CKD (chronic kidney disease) stage 3, GFR 30-59 ml/min (HCC) 12/29/2017   Cellulitis of left lower extremity    Ankle edema    Sepsis (HCC) 12/21/2017   Medication management 10/11/2016   Hyperlipidemia 06/18/2016   Gait abnormality 06/18/2016   Major depressive disorder, recurrent episode, 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   Edema 02/09/2015   History of CVA (cerebrovascular accident) 02/01/2015   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 (HCC)    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)    Chronic low back pain 01/28/2015   Short-term memory loss 01/28/2015   Volume depletion 01/28/2015   Persistent atrial fibrillation 01/26/2015   DCM (dilated cardiomyopathy) (HCC) 01/26/2015   TIA (transient ischemic attack) 01/24/2015   (HFpEF) heart failure with preserved ejection fraction (HCC)    Obesity (BMI 30-39.9) 01/27/2013   Constipation 09/02/2012   H/O arthroscopy of right knee 08/06/2012   Anxiety    Major depressive disorder    GERD (gastroesophageal reflux disease)    Hypertensive heart disease    OSA (obstructive sleep apnea)     Osteoarthritis of right knee    Hyperparathyroidism, primary (HCC) 04/22/2012   Chronic cough 10/02/2011   PCP:  Raymon Mutton., FNP Pharmacy:   [facility] [place], [place] - [address] [place] [address] Phone: [phone] Fax: [phone]     Social Drivers of Health (SDOH) Social History: SDOH Screenings   Food Insecurity: No Food Insecurity (02/27/2023)  Housing: Low Risk  (02/28/2023)  Transportation Needs: No Transportation Needs (02/27/2023)  Utilities: Not At Risk (02/27/2023)  Alcohol Screen: Low Risk  (10/21/2017)  Depression (PHQ2-9): Medium Risk (07/05/2021)  Financial Resource Strain: Low Risk  (11/17/2017)  Physical Activity: Insufficiently Active (11/17/2017)  Social Connections: Moderately Integrated (02/27/2023)  Stress: Stress Concern Present (11/17/2017)  Tobacco Use: Medium Risk (02/28/2023)   SDOH Interventions:     Readmission Risk Interventions    02/28/2023   10:04 AM  Readmission Risk Prevention Plan  Transportation Screening Complete  PCP or Specialist Appt within 3-5 Days Complete  HRI or Home Care Consult Complete  Social Work Consult for Recovery Care Planning/Counseling Complete  Palliative Care Screening Not Applicable  Medication Review Oceanographer) Complete

## 2023-02-28 NOTE — Consult Note (Signed)
WOC Nurse Consult Note: Reason for Consult: Consult requested for right heel.  Pink dry scar tissue surrounding healing Stage 3 pressure injury; remaining open area is red and moist, small amt yellow drainage, .2X.2X.2cm Pressure Injury POA: Yes Dressing procedure/placement/frequency: Topical treatment orders provided for bedside nurses to perform as follows: float heel to reduce pressure. Foam dressing to right heel, change Q 3 days or PRN soiling. Please re-consult if further assistance is needed.  Thank-you,  Cammie Mcgee MSN, RN, CWOCN, [place], CNS [phone]

## 2023-02-28 NOTE — Evaluation (Signed)
Occupational Therapy Evaluation Patient Details Name: Susan [name] MRN: [id number] DOB: [birth year] Today's Date: 02/28/2023   History of Present Illness   Pt is 83 yo female admitted on 02/27/23 with weakness and falls.  Pt with presumptive UTI.  Pt with hx including but not limited to for chronic HFpEF, paroxysmal A-fib on Eliquis, hyperlipidemia, CKD 3B, anemia of chronic disease, fracture of the distal shaft of the fifth metacarpal from a fall in January 2025, and R heel wound.     Clinical Impressions Patient seen for skilled OT initial evaluation. Pt required + 2 assistance for safe mobility and transfers. Pt moved from bed to recliner to 3 in 1 commode with max A + 2 with assist for all BADL's. Pt with significant stiffness and pain, B heel pu's, B UE and LE weakness, coordination, activity tolerance and balance deficits. Pt requires cues for cognitive and language deficits. OT will continue to follow while in Acute. Patient will benefit from continued inpatient follow up therapy, <3 hours/day.       If plan is discharge home, recommend the following:   Two people to help with walking and/or transfers;A lot of help with bathing/dressing/bathroom;Assistance with cooking/housework;Assistance with feeding     Functional Status Assessment   Patient has had a recent decline in their functional status and/or demonstrates limited ability to make significant improvements in function in a reasonable and predictable amount of time       Precautions/Restrictions   Precautions Precautions: Fall Precaution/Restrictions Comments: B heel pu (pt reports wearing post op shoe on R foot) Restrictions Weight Bearing Restrictions Per Provider Order: No     Mobility Bed Mobility Overal bed mobility: Needs Assistance Bed Mobility: Supine to Sit     Supine to sit: Total assist, +2 for physical assistance          Transfers Overall transfer level: Needs assistance Equipment used: 2  person hand held assist Transfers: Sit to/from Stand, Bed to chair/wheelchair/BSC Sit to Stand: Total assist, +2 physical assistance   Squat pivot transfers: Total assist, +2 physical assistance      Lateral/Scoot Transfers: Total assist, +2 physical assistance General transfer comment: Started with lateral scoot from bed to drop arm recliner with total x 2 and cues.  Once positioned in chair, states needs to have BM.  Tried to find drop arm BSC but unable.  Used standard BSC and total x 3 pivot to and from with assist for ADL.  Did not have BM      Balance Overall balance assessment: Needs assistance Sitting-balance support: No upper extremity supported Sitting balance-Leahy Scale: Fair Sitting balance - Comments: Preferred UE support but could sit with posterior tendency     Standing balance-Leahy Scale: Zero Standing balance comment: could not stand upright                           ADL either performed or assessed with clinical judgement   ADL Overall ADL's : Needs assistance/impaired Eating/Feeding: Minimal assistance;Bed level   Grooming: Wash/dry hands;Wash/dry face;Moderate assistance   Upper Body Bathing: Maximal assistance   Lower Body Bathing: Total assistance   Upper Body Dressing : Moderate assistance   Lower Body Dressing: Total assistance   Toilet Transfer: +2 for safety/equipment;+2 for physical assistance;Maximal assistance;Cueing for safety;Cueing for sequencing;Requires drop arm Toilet Transfer Details (indicate cue type and reason): lateral scoot +2 Toileting- Clothing Manipulation and Hygiene: Total assistance   Tub/ Shower Transfer: Total assistance  Functional mobility during ADLs: Total assistance;+2 for physical assistance;+2 for safety/equipment General ADL Comments: stiffness and Le pain, + 2 for lateral transfer to [facility] to and from recliner with lowered arm     Vision Baseline Vision/History: 1 Wears glasses Patient Visual  Report: No change from baseline Vision Assessment?: No apparent visual deficits     Perception Perception: Impaired       Praxis Praxis: Impaired       Pertinent Vitals/Pain Pain Assessment Pain Assessment: Faces Faces Pain Scale: Hurts little more Pain Location: Bil LE Pain Descriptors / Indicators: Discomfort, Grimacing, Guarding (L leg spasms) Pain Intervention(s): Limited activity within patient's tolerance, Repositioned, Relaxation     Extremity/Trunk Assessment Upper Extremity Assessment Upper Extremity Assessment: Generalized weakness;Right hand dominant;RUE deficits/detail;LUE deficits/detail RUE Deficits / Details: stiffness and generalized weakness grossly 3/5 RUE Coordination: decreased fine motor;decreased gross motor LUE Deficits / Details: generalized weakness, stiffness grossly 3/5 LUE Coordination: decreased fine motor;decreased gross motor   Lower Extremity Assessment Lower Extremity Assessment: Defer to PT evaluation RLE Deficits / Details: Pt very stiff throughout bil UE and LE and trunk.  Slowed movements.  ROM: bil knees near 90 degrees, ankles near neutral DF, hip WFL; MMT: limited testing due to pain but appears very weak and likely not greater than 3/5 LLE Deficits / Details: Pt very stiff throughout bil UE and LE and trunk. Slowed movements. ROM: bil knees near 90 degrees, ankles near neutral DF, hip WFL; MMT: limited testing due to pain but appears very weak and likely not greater than 3/5   Cervical / Trunk Assessment Cervical / Trunk Assessment: Kyphotic;Other exceptions Cervical / Trunk Exceptions: R trunk shortening   Communication Communication Communication: No apparent difficulties   Cognition Arousal: Alert Behavior During Therapy: WFL for tasks assessed/performed Cognition: Cognition impaired   Orientation impairments: Person, Place Awareness: Intellectual awareness impaired, Online awareness impaired Memory impairment (select all  impairments): Short-term memory Attention impairment (select first level of impairment): Sustained attention Executive functioning impairment (select all impairments): Organization, Sequencing, Reasoning, Problem solving                   Following commands: Intact                  Home Living Family/patient expects to be discharged to:: Private residence Living Arrangements: Alone Available Help at Discharge: Personal care attendant;Other (Comment) (4hr in am and 4 hr in pm -during week; alone on weekend) Type of Home: Apartment Home Access: Elevator     Home Layout: One level     Bathroom Shower/Tub: Producer, television/film/video: Standard Bathroom Accessibility: Yes How Accessible: Accessible via wheelchair;Accessible via walker Home Equipment: Shower seat;Grab bars - tub/shower;Rollator (4 wheels);Cane - single point;Wheelchair - manual          Prior Functioning/Environment Prior Level of Function : Needs assist  Cognitive Assist : Mobility (cognitive);ADLs (cognitive) Mobility (Cognitive): Intermittent cues ADLs (Cognitive): Intermittent cues Physical Assist : Mobility (physical);ADLs (physical) Mobility (physical): Bed mobility;Transfers;Gait;Stairs   Mobility Comments: Did not ambulate; pivoted to w/c with assist holding onto RW ADLs Comments: requiring assistance for ADLs    OT Problem List: Decreased strength;Decreased activity tolerance;Impaired balance (sitting and/or standing);Decreased coordination;Impaired vision/perception;Decreased cognition;Decreased safety awareness;Decreased knowledge of use of DME or AE   OT Treatment/Interventions: Self-care/ADL training;Therapeutic exercise;Neuromuscular education;Energy conservation;DME and/or AE instruction;Therapeutic activities      OT Goals(Current goals can be found in the care plan section)   Acute Rehab OT Goals Patient  Stated Goal: to get strong OT Goal Formulation: With patient Time  For Goal Achievement: 03/14/23 Potential to Achieve Goals: Fair ADL Goals Pt Will Perform Grooming: with supervision;sitting Pt Will Perform Upper Body Bathing: with supervision;sitting Pt Will Perform Lower Body Bathing: sitting/lateral leans;with mod assist Pt Will Perform Upper Body Dressing: with supervision;sitting Pt Will Perform Lower Body Dressing: with mod assist;sitting/lateral leans;sit to/from stand Pt Will Transfer to Toilet: with mod assist;bedside commode   OT Frequency:  Min 1X/week    Co-evaluation PT/OT/SLP Co-Evaluation/Treatment: Yes Reason for Co-Treatment: Complexity of the patient's impairments (multi-system involvement);Necessary to address cognition/behavior during functional activity;For patient/therapist safety;To address functional/ADL transfers PT goals addressed during session: Mobility/safety with mobility;Balance;Proper use of DME;Strengthening/ROM OT goals addressed during session: ADL's and self-care;Proper use of Adaptive equipment and DME;Strengthening/ROM      AM-PAC OT "6 Clicks" Daily Activity     Outcome Measure Help from another person eating meals?: A Little Help from another person taking care of personal grooming?: A Little Help from another person toileting, which includes using toliet, bedpan, or urinal?: Total Help from another person bathing (including washing, rinsing, drying)?: Total Help from another person to put on and taking off regular upper body clothing?: A Lot Help from another person to put on and taking off regular lower body clothing?: Total 6 Click Score: 11   End of Session Equipment Utilized During Treatment: Gait belt Nurse Communication: Mobility status;Need for lift equipment  Activity Tolerance: Patient limited by fatigue;Patient limited by pain Patient left: in chair;with call bell/phone within reach;with chair alarm set  OT Visit Diagnosis: Unsteadiness on feet (R26.81);Other abnormalities of gait and mobility  (R26.89);Muscle weakness (generalized) (M62.81);History of falling (Z91.81);Pain Pain - Right/Left: Right Pain - part of body: Leg                Time: 1610-9604 OT Time Calculation (min): 35 min Charges:  OT General Charges $OT Visit: 1 Visit OT Evaluation $OT Eval Moderate Complexity: 1 Mod OT Treatments $Therapeutic Activity: 8-22 mins  Levern Kalka OT/L Acute Rehabilitation Department  [phone]   Vicenta Dunning 02/28/2023, 2:08 PM

## 2023-02-28 NOTE — [place] FL2 (Signed)
[place] MEDICAID FL2 LEVEL OF CARE FORM     IDENTIFICATION  Patient Name: Susan [name] Birthdate: Sep 17, 1940 Sex: female Admission Date (Current Location): 02/27/2023  [facility] and [place] Number:  Producer, television/film/video and Address:  [facility],  [address] [place], [place] [address]      Provider Number: [id number]  Attending Physician Name and Address:  Azucena Fallen, MD  Relative Name and Phone Number:  Frady, Taddeo (Daughter)  [phone] [facility])    Current Level of Care: Hospital Recommended Level of Care: Skilled Nursing Facility Prior Approval Number:    Date Approved/Denied:   PASRR Number: [id number]  Discharge Plan: SNF    Current Diagnoses: Patient Active Problem List   Diagnosis Date Noted   Acute cystitis without hematuria 02/27/2023   Fall at home, initial encounter 10/24/2022   Generalized weakness 10/24/2022   Troponin level elevated 10/24/2022   Hypercalcemia 10/24/2022   Sterile pyuria 10/24/2022   Chronic diastolic CHF (congestive heart failure) (HCC) 10/24/2022   Paroxysmal atrial fibrillation (HCC) 10/24/2022   AKI (acute kidney injury) (HCC) 10/24/2022   Acute renal failure superimposed on stage 3b chronic kidney disease (HCC) 10/23/2022   Preoperative cardiovascular examination 07/08/2021   Suicidal ideation 07/05/2021   Acute pain of left knee 03/01/2020   Hx of total knee replacement, left 02/01/2020   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   Anemia of chronic disease 09/07/2018   Primary osteoarthritis of left knee 06/16/2018   Venous stasis dermatitis of left lower extremity 04/29/2018   Dementia (HCC) 03/12/2018   Recurrent cellulitis 02/12/2018   Essential hypertension 02/12/2018   Cellulitis and abscess of leg 12/29/2017   CKD (chronic kidney disease) stage 3, GFR 30-59 ml/min (HCC) 12/29/2017   Cellulitis of left lower extremity     Ankle edema    Sepsis (HCC) 12/21/2017   Medication management 10/11/2016   Hyperlipidemia 06/18/2016   Gait abnormality 06/18/2016   Major depressive disorder, recurrent episode, 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   Edema 02/09/2015   History of CVA (cerebrovascular accident) 02/01/2015   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 (HCC)    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)    Chronic low back pain 01/28/2015   Short-term memory loss 01/28/2015   Volume depletion 01/28/2015   Persistent atrial fibrillation 01/26/2015   DCM (dilated cardiomyopathy) (HCC) 01/26/2015   TIA (transient ischemic attack) 01/24/2015   (HFpEF) heart failure with preserved ejection fraction (HCC)    Obesity (BMI 30-39.9) 01/27/2013   Constipation 09/02/2012   H/O arthroscopy of right knee 08/06/2012   Anxiety    Major depressive disorder    GERD (gastroesophageal reflux disease)    Hypertensive heart disease    OSA (obstructive sleep apnea)    Osteoarthritis of right knee    Hyperparathyroidism, primary (HCC) 04/22/2012   Chronic cough 10/02/2011    Orientation RESPIRATION BLADDER Height & Weight     Self, Time, Situation, Place  Normal Incontinent, External catheter Weight: 72 kg Height:  5\' 1"  (154.9 cm)  BEHAVIORAL SYMPTOMS/MOOD NEUROLOGICAL BOWEL NUTRITION STATUS      Incontinent Diet  AMBULATORY STATUS COMMUNICATION OF NEEDS Skin   Extensive Assist Verbally PU Stage and Appropriate Care, Other (Comment) (Pressure injury right heel stage 3)   PU Stage 2 Dressing:  (Stage 2 pressure injury stage 3)  Personal Care Assistance Level of Assistance  Bathing, Feeding, Dressing Bathing Assistance: Limited assistance Feeding assistance: Limited assistance Dressing Assistance: Limited assistance     Functional Limitations Info  Sight, Hearing, Speech Sight Info: Adequate Hearing Info: Adequate Speech Info: Adequate    SPECIAL CARE FACTORS FREQUENCY   PT (By licensed PT), OT (By licensed OT)     PT Frequency: 5x/wk OT Frequency: 5x/wk            Contractures Contractures Info: Not present    Additional Factors Info  Code Status, Allergies, Psychotropic Code Status Info: Full Code Allergies Info: Ace Inhibitors, Lipitor (Atorvastatin), Simvastatin, Abilify (Aripiprazole), Latex Psychotropic Info: N/A         Current Medications (02/28/2023):  This is the current hospital active medication list Current Facility-Administered Medications  Medication Dose Route Frequency Provider Last Rate Last Admin   acetaminophen (TYLENOL) tablet 650 mg  650 mg Oral Q6H PRN Darlin Drop, DO   650 mg at 02/28/23 1610   apixaban (ELIQUIS) tablet 2.5 mg  2.5 mg Oral BID Dow Adolph N, DO   2.5 mg at 02/28/23 0459   cefTRIAXone (ROCEPHIN) 1 g in sodium chloride 0.9 % 100 mL IVPB  1 g Intravenous Q24H Dow Adolph N, DO 200 mL/hr at 02/28/23 1039 1 g at 02/28/23 1039   lactated ringers infusion   Intravenous Continuous Darlin Drop, DO 50 mL/hr at 02/28/23 0503 New Bag at 02/28/23 0503   losartan (COZAAR) tablet 50 mg  50 mg Oral Daily Dow Adolph N, DO   50 mg at 02/28/23 9604   melatonin tablet 5 mg  5 mg Oral QHS PRN Dow Adolph N, DO       oxyCODONE (Oxy IR/ROXICODONE) immediate release tablet 5 mg  5 mg Oral Q6H PRN Dow Adolph N, DO   5 mg at 02/28/23 5409   polyethylene glycol (MIRALAX / GLYCOLAX) packet 17 g  17 g Oral Daily PRN Dow Adolph N, DO       pravastatin (PRAVACHOL) tablet 40 mg  40 mg Oral Daily Dow Adolph N, DO   40 mg at 02/28/23 8119   prochlorperazine (COMPAZINE) injection 5 mg  5 mg Intravenous Q6H PRN Darlin Drop, DO         Discharge Medications: Please see discharge summary for a list of discharge medications.  Relevant Imaging Results:  Relevant Lab Results:   Additional Information SSN: [id number]  Howell Rucks, RN

## 2023-02-28 NOTE — Progress Notes (Signed)
PROGRESS NOTE    Susan [name]  VWU:[id number] DOB: 01-Dec-1940 DOA: 02/27/2023 PCP: Raymon Mutton., FNP   Brief Narrative:  Susan [name] is a 83 y.o. female with medical history significant for chronic HFpEF, paroxysmal A-fib on Eliquis, hyperlipidemia, CKD 3B, anemia of chronic disease, fracture of the distal shaft of the fifth metacarpal from a fall in January 2025, who initially presented to [facility] ED from SNF via EMS due to generalized weakness contributing to another fall 2 days ago.  Reportedly she also had urinary frequency and foul odor to her urine for the past few days.  Hospitalist called for admission.   Assessment & Plan:   Principal Problem:   Acute cystitis without hematuria   UTI, POA Does not meet sepsis criteria UA positive for pyuria Follow urine culture for ID and sensitivities Continue Rocephin empirically Narrow down antibiotics when able   Acute metabolic encephalopathy, POA  -Secondary to above, continue to follow  Generalized weakness, possibly contributed by presumed UTI High fall risk Continue to treat underlying condition given above PT OT assessment ongoing -currently recommending SNF Fall precautions ongoing   CKD 3B Near baseline (Cr 1.6; GFR 30s) Continue to follow IV fluids x 24 hours until p.o. intake is more appropriate   Hypertension, BP is not at goal, elevated Resume home oral antihypertensives Monitor vital signs   Chronic HFpEF Last 2D echo done in 2021 revealed LVEF 55 to 60% with grade 1 diastolic dysfunction Closely monitor volume status while on IV fluid hydration Start strict I's and O's and daily weight   Hyperlipidemia Resume home pravastatin.   Anemia of chronic disease Hemoglobin is at baseline No reported bleeding   Right heel pressure wound, POA Wound care specialist consulted Continue local wound care As needed analgesics and as needed bowel regimen.   DVT prophylaxis: apixaban (ELIQUIS) tablet 2.5  mg Start: 02/28/23 0400 apixaban (ELIQUIS) tablet 2.5 mg   Code Status:   Code Status: Full Code  Family Communication: Daughter updated over the phone  Status is: Inpatient  Dispo: The patient is from: Home              Anticipated d/c is to: SNF              Anticipated d/c date is: 48 to 72 hours pending clinical course              Patient currently not medically stable for discharge  Consultants:  None  Procedures:  None  Antimicrobials:  Ceftriaxone  Subjective: No acute issues or events overnight, mental status making review of systems somewhat limited but no overt pains of pain nausea vomiting diarrhea constipation.  Objective: Vitals:   02/27/23 2235 02/27/23 2331 02/28/23 0330 02/28/23 0651  BP:  (!) 144/78 (!) 151/73 (!) 149/84  Pulse:  67 70 77  Resp:  16 17 16   Temp: 98.9 F (37.2 C) 99.4 F (37.4 C) 98.6 F (37 C) 99.4 F (37.4 C)  TempSrc: Oral Oral    SpO2:  93% 99% 96%  Weight:  72 kg    Height:  5\' 1"  (1.549 m)      Intake/Output Summary (Last 24 hours) at 02/28/2023 0744 Last data filed at 02/28/2023 0600 Gross per 24 hour  Intake 320.49 ml  Output 15 ml  Net 305.49 ml   Filed Weights   02/27/23 2331  Weight: 72 kg    Examination:  General:  Pleasantly resting in bed, No acute distress. Alert  to person/place only. HEENT:  Normocephalic atraumatic.  Sclerae nonicteric, noninjected.  Extraocular movements intact bilaterally. Neck:  Without mass or deformity.  Trachea is midline. Lungs:  Clear to auscultate bilaterally without rhonchi, wheeze, or rales. Heart:  Regular rate and rhythm.  Without murmurs, rubs, or gallops. Abdomen:  Soft, nontender, nondistended.  Without guarding or rebound. Extremities: Without cyanosis, clubbing, edema, or obvious deformity. Skin:  Warm and dry, no erythema.  Data Reviewed: I have personally reviewed following labs and imaging studies  CBC: Recent Labs  Lab 02/27/23 1739  WBC 8.5  NEUTROABS 6.6   HGB 11.7*  HCT 36.2  MCV 96.0  PLT 254   Basic Metabolic Panel: Recent Labs  Lab 02/27/23 1739  NA 138  K 3.5  CL 100  CO2 29  GLUCOSE 97  BUN 35*  CREATININE 1.49*  CALCIUM 10.9*   GFR: Estimated Creatinine Clearance: 25.1 mL/min (A) (by C-G formula based on SCr of 1.49 mg/dL (H)). Liver Function Tests: Recent Labs  Lab 02/27/23 1739  AST 17  ALT 12  ALKPHOS 74  BILITOT 0.6  PROT 7.7  ALBUMIN 4.4   Sepsis Labs: Recent Labs  Lab 02/27/23 1739  LATICACIDVEN 0.7    No results found for this or any previous visit (from the past 240 hours).   Radiology Studies: CT Head Wo Contrast Result Date: 02/27/2023 CLINICAL DATA:  Head trauma, minor (Age >= 65y).  Fall. EXAM: CT HEAD WITHOUT CONTRAST TECHNIQUE: Contiguous axial images were obtained from the base of the skull through the vertex without intravenous contrast. RADIATION DOSE REDUCTION: This exam was performed according to the departmental dose-optimization program which includes automated exposure control, adjustment of the mA and/or kV according to patient size and/or use of iterative reconstruction technique. COMPARISON:  01/08/2023 FINDINGS: Brain: There is atrophy and chronic small vessel disease changes. No acute intracranial abnormality. Specifically, no hemorrhage, hydrocephalus, mass lesion, acute infarction, or significant intracranial injury. Vascular: No hyperdense vessel or unexpected calcification. Skull: No acute calvarial abnormality. Sinuses/Orbits: No acute findings Other: None IMPRESSION: Atrophy, chronic microvascular disease. No acute intracranial abnormality. Electronically Signed   By: Charlett Nose M.D.   On: 02/27/2023 20:21   DG Knee Complete 4 Views Right Result Date: 02/27/2023 CLINICAL DATA:  Fall with right knee pain. EXAM: RIGHT KNEE - COMPLETE 4+ VIEW COMPARISON:  Right knee radiographs 07/29/2012 FINDINGS: Redemonstration of total right knee arthroplasty. Interval removal of the prior  anterior surgical skin staples and superior knee drain seen on prior remote 07/29/2012 radiographs following surgery. Small joint effusion, decreased from prior. Mild chronic enthesopathic change at the quadriceps insertion on the patella, similar to prior. No perihardware lucency is seen to indicate hardware failure or loosening. No acute fracture or dislocation. IMPRESSION: 1. 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 2. Small joint effusion, decreased from prior. Electronically Signed   By: Neita Garnet M.D.   On: 02/27/2023 17:49   DG Foot Complete Right Result Date: 02/27/2023 CLINICAL DATA:  Fall with pain.  Pressure injury on right heel. EXAM: RIGHT ANKLE - COMPLETE 3+ VIEW; RIGHT FOOT COMPLETE - 3+ VIEW COMPARISON:  Right foot radiographs 01/10/2023 FINDINGS: There is diffuse decreased bone mineralization. Right ankle: The ankle mortise is symmetric and intact. Tiny plantar calcaneal heel spur. Moderate calcaneocuboid, mild-to-moderate talonavicular, and mild-to-moderate navicular-cuneiform joint space narrowing, subchondral sclerosis, and dorsal osteophytosis. There is external material overlying the heel and lateral view. Within this limitation, no definite calcaneal cortical erosion is seen. Small  plantar calcaneal heel spur from better seen on 01/10/2023 comparison. Right foot: Interval high-grade healing of the previously seen oblique fracture of the distal shaft of the fifth metatarsal. Mild persistent fracture line lucency. IMPRESSION: 1. Interval high-grade healing of the previously seen oblique now subacute fracture of the distal shaft of the fifth metatarsal. No acute fracture is seen. 2. 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 3. No definite calcaneal cortical erosion is seen. Electronically Signed   By: Neita Garnet M.D.   On: 02/27/2023 17:48   DG Ankle Complete Right Result Date:  02/27/2023 CLINICAL DATA:  Fall with pain.  Pressure injury on right heel. EXAM: RIGHT ANKLE - COMPLETE 3+ VIEW; RIGHT FOOT COMPLETE - 3+ VIEW COMPARISON:  Right foot radiographs 01/10/2023 FINDINGS: There is diffuse decreased bone mineralization. Right ankle: The ankle mortise is symmetric and intact. Tiny plantar calcaneal heel spur. Moderate calcaneocuboid, mild-to-moderate talonavicular, and mild-to-moderate navicular-cuneiform joint space narrowing, subchondral sclerosis, and dorsal osteophytosis. There is external material overlying the heel and lateral view. Within this limitation, no definite calcaneal cortical erosion is seen. Small plantar calcaneal heel spur from better seen on 01/10/2023 comparison. Right foot: Interval high-grade healing of the previously seen oblique fracture of the distal shaft of the fifth metatarsal. Mild persistent fracture line lucency. IMPRESSION: 1. Interval high-grade healing of the previously seen oblique now subacute fracture of the distal shaft of the fifth metatarsal. No acute fracture is seen. 2. 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 3. No definite calcaneal cortical erosion is seen. Electronically Signed   By: Neita Garnet M.D.   On: 02/27/2023 17:48        Scheduled Meds:  apixaban  2.5 mg Oral BID   losartan  50 mg Oral Daily   pravastatin  40 mg Oral Daily   Continuous Infusions:  cefTRIAXone (ROCEPHIN)  IV     lactated ringers 50 mL/hr at 02/28/23 0503     LOS: 1 day   Time spent:  Azucena Fallen, DO [facility]  If 7PM-7AM, please contact night-coverage [email or web address]  02/28/2023, 7:44 AM

## 2023-03-01 DIAGNOSIS — N3000 Acute cystitis without hematuria: Secondary | ICD-10-CM | POA: Diagnosis not present

## 2023-03-01 LAB — BASIC METABOLIC PANEL
Anion gap: 10 (ref 5–15)
BUN: 37 mg/dL — ABNORMAL HIGH (ref 8–23)
CO2: 26 mmol/L (ref 22–32)
Calcium: 9.6 mg/dL (ref 8.9–10.3)
Chloride: 105 mmol/L (ref 98–111)
Creatinine, Ser: 1.33 mg/dL — ABNORMAL HIGH (ref 0.44–1.00)
GFR, Estimated: 39 mL/min — ABNORMAL LOW (ref >60)
Glucose, Bld: 99 mg/dL (ref 70–99)
Potassium: 3.6 mmol/L (ref 3.5–5.1)
Sodium: 141 mmol/L (ref 135–145)

## 2023-03-01 LAB — CBC
HCT: 33.7 % — ABNORMAL LOW (ref 36.0–46.0)
Hemoglobin: 10.6 g/dL — ABNORMAL LOW (ref 12.0–15.0)
MCH: 31.6 pg (ref 26.0–34.0)
MCHC: 31.5 g/dL (ref 30.0–36.0)
MCV: 100.6 fL — ABNORMAL HIGH (ref 80.0–100.0)
Platelets: 220 10*3/uL (ref 150–400)
RBC: 3.35 MIL/uL — ABNORMAL LOW (ref 3.87–5.11)
RDW: 13.3 % (ref 11.5–15.5)
WBC: 4.6 10*3/uL (ref 4.0–10.5)
nRBC: 0 % (ref 0.0–0.2)

## 2023-03-01 MED ORDER — HYDRALAZINE HCL 20 MG/ML IJ SOLN
10.0000 mg | Freq: Four times a day (QID) | INTRAMUSCULAR | Status: DC | PRN
  Administered 2023-03-07: 10 mg via INTRAVENOUS
  Filled 2023-03-01: qty 1

## 2023-03-01 MED ORDER — SERTRALINE HCL 100 MG PO TABS
100.0000 mg | ORAL_TABLET | Freq: Every day | ORAL | Status: DC
  Administered 2023-03-01 – 2023-03-11 (×11): 100 mg via ORAL
  Filled 2023-03-01 (×11): qty 1

## 2023-03-01 MED ORDER — ARIPIPRAZOLE 5 MG PO TABS
10.0000 mg | ORAL_TABLET | Freq: Every evening | ORAL | Status: DC
  Administered 2023-03-01 – 2023-03-10 (×10): 10 mg via ORAL
  Filled 2023-03-01 (×9): qty 1
  Filled 2023-03-01: qty 2

## 2023-03-01 MED ORDER — FLUTICASONE FUROATE-VILANTEROL 100-25 MCG/ACT IN AEPB
1.0000 | INHALATION_SPRAY | Freq: Every day | RESPIRATORY_TRACT | Status: DC
  Administered 2023-03-01 – 2023-03-11 (×11): 1 via RESPIRATORY_TRACT
  Filled 2023-03-01: qty 28

## 2023-03-01 MED ORDER — CARMEX CLASSIC LIP BALM EX OINT: TOPICAL_OINTMENT | CUTANEOUS | Status: DC | PRN

## 2023-03-01 MED ORDER — AMLODIPINE BESYLATE 5 MG PO TABS
5.0000 mg | ORAL_TABLET | Freq: Every day | ORAL | Status: DC
  Administered 2023-03-01 – 2023-03-11 (×10): 5 mg via ORAL
  Filled 2023-03-01 (×11): qty 1

## 2023-03-01 MED ORDER — DONEPEZIL HCL 10 MG PO TABS
10.0000 mg | ORAL_TABLET | Freq: Every day | ORAL | Status: DC
  Administered 2023-03-01 – 2023-03-10 (×10): 10 mg via ORAL
  Filled 2023-03-01 (×10): qty 1

## 2023-03-01 NOTE — Progress Notes (Signed)
 PROGRESS NOTE    Susan [name]  WUJ:[id number] DOB: 20-Aug-1940 DOA: 02/27/2023 PCP: Raymon Mutton., FNP   Brief Narrative:  Susan [name] is a 83 y.o. female with medical history significant for chronic HFpEF, paroxysmal A-fib on Eliquis, hyperlipidemia, CKD 3B, anemia of chronic disease, fracture of the distal shaft of the fifth metacarpal from a fall in January 2025, who initially presented to [facility] ED from SNF via EMS due to generalized weakness contributing to another fall 2 days ago.  Reportedly she also had urinary frequency and foul odor to her urine for the past few days.  Hospitalist called for admission.  Assessment & Plan:   Principal Problem:   Acute cystitis without hematuria  UTI, POA Does not meet sepsis criteria UA positive for pyuria Follow urine culture for ID and sensitivities Continue Rocephin empirically Narrow down antibiotics when able   Acute metabolic encephalopathy, POA  -Secondary to above, continue to follow -Improving somewhat, likely consistent with UTI as primary driving force of mental status changes  Generalized weakness, possibly contributed by presumed UTI High fall risk Continue to treat underlying condition given above PT OT assessment ongoing -currently recommending SNF Fall precautions ongoing   CKD 3B Baseline creatinine around 1.5-1.6, currently at baseline Continue to follow IV fluids discontinued, p.o. intake much more appropriate over the past 12 hours   Hypertension, BP is not at goal, elevated Resume home oral antihypertensives Monitor vital signs   Chronic HFpEF Last 2D echo done in 2021 revealed LVEF 55 to 60% with grade 1 diastolic dysfunction Closely monitor volume status while on IV fluid hydration Start strict I's and O's and daily weight   Hyperlipidemia Resume home pravastatin.   Anemia of chronic disease Hemoglobin is at baseline No reported bleeding   Right heel pressure wound, POA Wound care  specialist consulted Continue local wound care As needed analgesics and as needed bowel regimen.   DVT prophylaxis: apixaban (ELIQUIS) tablet 2.5 mg Start: 02/28/23 0400 apixaban (ELIQUIS) tablet 2.5 mg   Code Status:   Code Status: Full Code  Family Communication: Daughter updated over the phone  Status is: Inpatient  Dispo: The patient is from: Home              Anticipated d/c is to: SNF pending further PT evaluation              Anticipated d/c date is: 48 to 72 hours pending clinical course              Patient currently not medically stable for discharge  Consultants:  None  Procedures:  None  Antimicrobials:  Ceftriaxone  Subjective: No acute issues or events overnight, mental status continues to improve, able to discuss her daughters visit her breakfast as well as lunch, much more alert and appropriate today  Objective: Vitals:   02/28/23 0651 02/28/23 0936 02/28/23 1316 02/28/23 2153  BP: (!) 149/84 (!) 158/83 (!) 161/68 (!) 174/84  Pulse: 77 69 73 72  Resp: 16 15 15 18   Temp: 99.4 F (37.4 C) 99.1 F (37.3 C) 99.3 F (37.4 C) 99.2 F (37.3 C)  TempSrc:   Oral Oral  SpO2: 96% 98% 100% 98%  Weight:      Height:        Intake/Output Summary (Last 24 hours) at 03/01/2023 0747 Last data filed at 03/01/2023 0651 Gross per 24 hour  Intake 445.43 ml  Output 600 ml  Net -154.57 ml   [employer]  02/27/23 2331  Weight: 72 kg    Examination:  General:  Pleasantly resting in bed, No acute distress.  ANO x 3 HEENT:  Normocephalic atraumatic.  Sclerae nonicteric, noninjected.  Extraocular movements intact bilaterally. Neck:  Without mass or deformity.  Trachea is midline. Lungs:  Clear to auscultate bilaterally without rhonchi, wheeze, or rales. Heart:  Regular rate and rhythm.  Without murmurs, rubs, or gallops. Abdomen:  Soft, nontender, nondistended.  Without guarding or rebound. Extremities: Without cyanosis, clubbing, edema, or obvious  deformity. Skin:  Warm and dry, no erythema.  Data Reviewed: I have personally reviewed following labs and imaging studies  CBC: Recent Labs  Lab 02/27/23 1739 02/28/23 0644 03/01/23 0346  WBC 8.5 7.6 4.6  NEUTROABS 6.6  --   --   HGB 11.7* 11.1* 10.6*  HCT 36.2 34.8* 33.7*  MCV 96.0 98.6 100.6*  PLT 254 257 220   Basic Metabolic Panel: Recent Labs  Lab 02/27/23 1739 02/28/23 0644 03/01/23 0346  NA 138 139 141  K 3.5 3.5 3.6  CL 100 100 105  CO2 29 26 26   GLUCOSE 97 109* 99  BUN 35* 40* 37*  CREATININE 1.49* 1.51* 1.33*  CALCIUM 10.9* 10.1 9.6  MG  --  2.1  --   PHOS  --  3.3  --    GFR: Estimated Creatinine Clearance: 28.1 mL/min (A) (by C-G formula based on SCr of 1.33 mg/dL (H)). Liver Function Tests: Recent Labs  Lab 02/27/23 1739  AST 17  ALT 12  ALKPHOS 74  BILITOT 0.6  PROT 7.7  ALBUMIN 4.4   Sepsis Labs: Recent Labs  Lab 02/27/23 1739  LATICACIDVEN 0.7    No results found for this or any previous visit (from the past 240 hours).   Radiology Studies: CT Head Wo Contrast Result Date: 02/27/2023 CLINICAL DATA:  Head trauma, minor (Age >= 65y).  Fall. EXAM: CT HEAD WITHOUT CONTRAST TECHNIQUE: Contiguous axial images were obtained from the base of the skull through the vertex without intravenous contrast. RADIATION DOSE REDUCTION: This exam was performed according to the departmental dose-optimization program which includes automated exposure control, adjustment of the mA and/or kV according to patient size and/or use of iterative reconstruction technique. COMPARISON:  01/08/2023 FINDINGS: Brain: There is atrophy and chronic small vessel disease changes. No acute intracranial abnormality. Specifically, no hemorrhage, hydrocephalus, mass lesion, acute infarction, or significant intracranial injury. Vascular: No hyperdense vessel or unexpected calcification. Skull: No acute calvarial abnormality. Sinuses/Orbits: No acute findings Other: None IMPRESSION:  Atrophy, chronic microvascular disease. No acute intracranial abnormality. Electronically Signed   By: Charlett Nose M.D.   On: 02/27/2023 20:21   DG Knee Complete 4 Views Right Result Date: 02/27/2023 CLINICAL DATA:  Fall with right knee pain. EXAM: RIGHT KNEE - COMPLETE 4+ VIEW COMPARISON:  Right knee radiographs 07/29/2012 FINDINGS: Redemonstration of total right knee arthroplasty. Interval removal of the prior anterior surgical skin staples and superior knee drain seen on prior remote 07/29/2012 radiographs following surgery. Small joint effusion, decreased from prior. Mild chronic enthesopathic change at the quadriceps insertion on the patella, similar to prior. No perihardware lucency is seen to indicate hardware failure or loosening. No acute fracture or dislocation. IMPRESSION: 1. 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 2. Small joint effusion, decreased from prior. Electronically Signed   By: Neita Garnet M.D.   On: 02/27/2023 17:49   DG Foot Complete Right Result Date: 02/27/2023 CLINICAL DATA:  Fall with pain.  Pressure injury  on right heel. EXAM: RIGHT ANKLE - COMPLETE 3+ VIEW; RIGHT FOOT COMPLETE - 3+ VIEW COMPARISON:  Right foot radiographs 01/10/2023 FINDINGS: There is diffuse decreased bone mineralization. Right ankle: The ankle mortise is symmetric and intact. Tiny plantar calcaneal heel spur. Moderate calcaneocuboid, mild-to-moderate talonavicular, and mild-to-moderate navicular-cuneiform joint space narrowing, subchondral sclerosis, and dorsal osteophytosis. There is external material overlying the heel and lateral view. Within this limitation, no definite calcaneal cortical erosion is seen. Small plantar calcaneal heel spur from better seen on 01/10/2023 comparison. Right foot: Interval high-grade healing of the previously seen oblique fracture of the distal shaft of the fifth metatarsal. Mild persistent fracture line lucency. IMPRESSION: 1. Interval high-grade  healing of the previously seen oblique now subacute fracture of the distal shaft of the fifth metatarsal. No acute fracture is seen. 2. 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 3. No definite calcaneal cortical erosion is seen. Electronically Signed   By: Neita Garnet M.D.   On: 02/27/2023 17:48   DG Ankle Complete Right Result Date: 02/27/2023 CLINICAL DATA:  Fall with pain.  Pressure injury on right heel. EXAM: RIGHT ANKLE - COMPLETE 3+ VIEW; RIGHT FOOT COMPLETE - 3+ VIEW COMPARISON:  Right foot radiographs 01/10/2023 FINDINGS: There is diffuse decreased bone mineralization. Right ankle: The ankle mortise is symmetric and intact. Tiny plantar calcaneal heel spur. Moderate calcaneocuboid, mild-to-moderate talonavicular, and mild-to-moderate navicular-cuneiform joint space narrowing, subchondral sclerosis, and dorsal osteophytosis. There is external material overlying the heel and lateral view. Within this limitation, no definite calcaneal cortical erosion is seen. Small plantar calcaneal heel spur from better seen on 01/10/2023 comparison. Right foot: Interval high-grade healing of the previously seen oblique fracture of the distal shaft of the fifth metatarsal. Mild persistent fracture line lucency. IMPRESSION: 1. Interval high-grade healing of the previously seen oblique now subacute fracture of the distal shaft of the fifth metatarsal. No acute fracture is seen. 2. 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 3. No definite calcaneal cortical erosion is seen. Electronically Signed   By: Neita Garnet M.D.   On: 02/27/2023 17:48        Scheduled Meds:  apixaban  2.5 mg Oral BID   losartan  50 mg Oral Daily   pravastatin  40 mg Oral Daily   Continuous Infusions:  cefTRIAXone (ROCEPHIN)  IV 1 g (02/28/23 1039)     LOS: 2 days   Time spent:   Azucena Fallen, DO [facility]  If 7PM-7AM, please contact night-coverage [email or web address]  03/01/2023, 7:47 AM

## 2023-03-01 NOTE — Plan of Care (Signed)
   Problem: Clinical Measurements: Goal: Respiratory complications will improve Outcome: Progressing

## 2023-03-02 DIAGNOSIS — N3000 Acute cystitis without hematuria: Secondary | ICD-10-CM | POA: Diagnosis not present

## 2023-03-02 LAB — BASIC METABOLIC PANEL
Anion gap: 9 (ref 5–15)
BUN: 35 mg/dL — ABNORMAL HIGH (ref 8–23)
CO2: 25 mmol/L (ref 22–32)
Calcium: 9.5 mg/dL (ref 8.9–10.3)
Chloride: 105 mmol/L (ref 98–111)
Creatinine, Ser: 1.2 mg/dL — ABNORMAL HIGH (ref 0.44–1.00)
GFR, Estimated: 44 mL/min — ABNORMAL LOW (ref >60)
Glucose, Bld: 102 mg/dL — ABNORMAL HIGH (ref 70–99)
Potassium: 3.7 mmol/L (ref 3.5–5.1)
Sodium: 139 mmol/L (ref 135–145)

## 2023-03-02 LAB — URINE CULTURE: Culture: >=100000 — AB

## 2023-03-02 LAB — CBC
HCT: 33.6 % — ABNORMAL LOW (ref 36.0–46.0)
Hemoglobin: 10.4 g/dL — ABNORMAL LOW (ref 12.0–15.0)
MCH: 30.9 pg (ref 26.0–34.0)
MCHC: 31 g/dL (ref 30.0–36.0)
MCV: 99.7 fL (ref 80.0–100.0)
Platelets: 230 10*3/uL (ref 150–400)
RBC: 3.37 MIL/uL — ABNORMAL LOW (ref 3.87–5.11)
RDW: 13.2 % (ref 11.5–15.5)
WBC: 4 10*3/uL (ref 4.0–10.5)
nRBC: 0 % (ref 0.0–0.2)

## 2023-03-02 NOTE — Progress Notes (Signed)
   03/02/23 2206  BiPAP/CPAP/SIPAP  Reason BIPAP/CPAP not in use Non-compliant;Other(comment) (refused)   First pt said she would like to use cpap. RT went and got the machine. Pt then decided not to use it. Pt said last time she worn it was 3 months ago. She asked if she was going to be charged for the use of the machine. Encouraged her not to worry about that and use the machine, PT still refused. RN aware of the whole conversation.

## 2023-03-02 NOTE — Progress Notes (Signed)
 PROGRESS NOTE    Susan [name]  WUJ:[id number] DOB: 08-05-1940 DOA: 02/27/2023 PCP: Raymon Mutton., FNP   Brief Narrative:  Susan [name] is a 83 y.o. female with medical history significant for chronic HFpEF, paroxysmal A-fib on Eliquis, hyperlipidemia, CKD 3B, anemia of chronic disease, fracture of the distal shaft of the fifth metacarpal from a fall in January 2025, who initially presented to [facility] ED from SNF via EMS due to generalized weakness contributing to another fall 2 days ago.  Reportedly she also had urinary frequency and foul odor to her urine for the past few days.  Hospitalist called for admission.  Assessment & Plan:   Principal Problem:   Acute cystitis without hematuria  UTI, POA Does not meet sepsis criteria UA positive for pyuria Follow urine culture for ID and sensitivities Continue Rocephin empirically -last dose 03/02/2018   Acute metabolic encephalopathy, POA  -Secondary to above, continue to follow -Improving markedly, likely consistent with UTI as primary driving force of mental status changes  Generalized weakness, possibly contributed by presumed UTI High fall risk Continue to treat underlying condition given above PT OT assessment ongoing -currently recommending SNF Fall precautions ongoing   CKD 3B Baseline creatinine around 1.5-1.6 currently at baseline Continue to follow IV fluids discontinued, p.o. intake much more appropriate over the past 12 hours   Hypertension, BP is not at goal, elevated Resume home oral antihypertensives Monitor vital signs   Chronic HFpEF Last 2D echo done in 2021 revealed LVEF 55 to 60% with grade 1 diastolic dysfunction Closely monitor volume status while on IV fluid hydration Start strict I's and O's and daily weight   Hyperlipidemia Resume home pravastatin.   Anemia of chronic disease Hemoglobin is at baseline No reported bleeding   Right heel pressure wound, POA Wound care specialist  consulted Continue local wound care As needed analgesics and as needed bowel regimen.  DVT prophylaxis: apixaban (ELIQUIS) tablet 2.5 mg Start: 02/28/23 0400 apixaban (ELIQUIS) tablet 2.5 mg  Code Status:   Code Status: Full Code Family Communication: Daughter updated over the phone  Status is: Inpatient  Dispo: The patient is from: Home              Anticipated d/c is to: SNF pending further PT evaluation              Anticipated d/c date is: 48 to 72 hours pending clinical course              Patient currently not medically stable for discharge  Consultants:  None  Procedures:  None  Antimicrobials:  Ceftriaxone  Subjective: No acute issues or events overnight, mental status continues to improve drastically, likely at baseline today.  Patient awaiting reevaluation by PT given marked improvement she is hopeful to discharge home rather than return to nursing facility/rehab  Objective: Vitals:   02/28/23 2153 03/01/23 1324 03/01/23 2120 03/02/23 0508  BP: (!) 174/84 (!) 159/79 (!) 159/79 (!) 151/65  Pulse: 72 82 87 61  Resp: 18 15 19 18   Temp: 99.2 F (37.3 C) 100 F (37.8 C) 99.4 F (37.4 C) 98.9 F (37.2 C)  TempSrc: Oral Oral Oral Oral  SpO2: 98% 100% 100% 93%  Weight:      Height:        Intake/Output Summary (Last 24 hours) at 03/02/2023 0739 Last data filed at 03/02/2023 0511 Gross per 24 hour  Intake 1760 ml  Output 1200 ml  Net 560 ml   Filed  Weights   02/27/23 2331  Weight: 72 kg    Examination:  General:  Pleasantly resting in bed, No acute distress.  ANO x 3 HEENT:  Normocephalic atraumatic.  Sclerae nonicteric, noninjected.  Extraocular movements intact bilaterally. Neck:  Without mass or deformity.  Trachea is midline. Lungs:  Clear to auscultate bilaterally without rhonchi, wheeze, or rales. Heart:  Regular rate and rhythm.  Without murmurs, rubs, or gallops. Abdomen:  Soft, nontender, nondistended.  Without guarding or rebound. Extremities:  Without cyanosis, clubbing, edema, or obvious deformity. Skin:  Warm and dry, no erythema.  Data Reviewed: I have personally reviewed following labs and imaging studies  CBC: Recent Labs  Lab 02/27/23 1739 02/28/23 0644 03/01/23 0346 03/02/23 0320  WBC 8.5 7.6 4.6 4.0  NEUTROABS 6.6  --   --   --   HGB 11.7* 11.1* 10.6* 10.4*  HCT 36.2 34.8* 33.7* 33.6*  MCV 96.0 98.6 100.6* 99.7  PLT 254 257 220 230   Basic Metabolic Panel: Recent Labs  Lab 02/27/23 1739 02/28/23 0644 03/01/23 0346 03/02/23 0320  NA 138 139 141 139  K 3.5 3.5 3.6 3.7  CL 100 100 105 105  CO2 29 26 26 25   GLUCOSE 97 109* 99 102*  BUN 35* 40* 37* 35*  CREATININE 1.49* 1.51* 1.33* 1.20*  CALCIUM 10.9* 10.1 9.6 9.5  MG  --  2.1  --   --   PHOS  --  3.3  --   --    GFR: Estimated Creatinine Clearance: 31.1 mL/min (A) (by C-G formula based on SCr of 1.2 mg/dL (H)). Liver Function Tests: Recent Labs  Lab 02/27/23 1739  AST 17  ALT 12  ALKPHOS 74  BILITOT 0.6  PROT 7.7  ALBUMIN 4.4   Sepsis Labs: Recent Labs  Lab 02/27/23 1739  LATICACIDVEN 0.7    Recent Results (from the past 240 hours)  Urine Culture     Status: Abnormal (Preliminary result)   Collection Time: 02/27/23  8:30 PM   Specimen: Urine, Random  Result Value Ref Range Status   Specimen Description   Final    URINE, RANDOM Performed at [facility], [address] [place], [place] [address]    Special Requests   Final    NONE Reflexed from [id number] Performed at [facility], [address], [place], [place] [address]    Culture (A)  Final    >=100,000 COLONIES/mL ESCHERICHIA COLI SUSCEPTIBILITIES TO FOLLOW Performed at [facility], [address] [place], [place] [address]    Report Status PENDING  Incomplete     Radiology Studies: No results found.  Scheduled Meds:  amLODipine  5 mg Oral Daily   apixaban  2.5 mg Oral BID   ARIPiprazole  10 mg Oral QPM   donepezil   10 mg Oral QHS   fluticasone furoate-vilanterol  1 puff Inhalation Daily   losartan  50 mg Oral Daily   pravastatin  40 mg Oral Daily   sertraline  100 mg Oral Daily   Continuous Infusions:  cefTRIAXone (ROCEPHIN)  IV 1 g (03/01/23 1007)     LOS: 3 days   Time spent:  Azucena Fallen, DO [facility]  If 7PM-7AM, please contact night-coverage [email or web address]  03/02/2023, 7:39 AM

## 2023-03-02 NOTE — TOC Progression Note (Signed)
 Transition of Care [facility]) - Progression Note    Patient Details  Name: Susan [name] MRN: [id number] Date of Birth: 01-May-1940  Transition of Care [facility]) CM/SW Contact  Beckie Busing, RN Phone Number:[phone]  03/02/2023, 10:25 AM  Clinical Narrative:    TOC acknowledges new consult for "Pt was slated for d/c to a SNF but now a poss d/c today back home instead." Toc following for updated PT assessment.    Expected Discharge Plan: Home w Home Health Services Barriers to Discharge: Continued Medical Work up  Expected Discharge Plan and Services       Living arrangements for the past 2 months: Apartment                                       Social Determinants of Health (SDOH) Interventions SDOH Screenings   Food Insecurity: No Food Insecurity (02/27/2023)  Housing: Low Risk  (02/28/2023)  Transportation Needs: No Transportation Needs (02/27/2023)  Utilities: Not At Risk (02/27/2023)  Alcohol Screen: Low Risk  (10/21/2017)  Depression (PHQ2-9): Medium Risk (07/05/2021)  Financial Resource Strain: Low Risk  (11/17/2017)  Physical Activity: Insufficiently Active (11/17/2017)  Social Connections: Moderately Integrated (02/27/2023)  Stress: Stress Concern Present (11/17/2017)  Tobacco Use: Medium Risk (02/28/2023)    Readmission Risk Interventions    02/28/2023   10:04 AM  Readmission Risk Prevention Plan  Transportation Screening Complete  PCP or Specialist Appt within 3-5 Days Complete  HRI or Home Care Consult Complete  Social Work Consult for Recovery Care Planning/Counseling Complete  Palliative Care Screening Not Applicable  Medication Review [employer]) Complete

## 2023-03-02 NOTE — Progress Notes (Signed)
 Physical Therapy Treatment Patient Details Name: Susan [name] MRN: [id number] DOB: Apr 15, 1940 Today's Date: 03/02/2023   History of Present Illness Pt is 83 yo female admitted on 02/27/23 with weakness and falls.  Pt with presumptive UTI.  Pt with hx including but not limited to for chronic HFpEF, paroxysmal A-fib on Eliquis, hyperlipidemia, CKD 3B, anemia of chronic disease, fracture of the distal shaft of the fifth metacarpal from a fall in January 2025, and R heel wound.    PT Comments  On arrival to room pt found incontinent of bowel but seemingly unaware.  Pt requiring assist to roll side to side to assist nursing with hygiene.  Pt requiring increased time, HOB elevated and use of bedrail plus physical assist to move supine to sit and demonstrating only fair sitting balance at bedside with cueing required to correct increasing posterior drift.  Significant assist of two required to stand from elevated bedside, correct balance for posterior lean and side step with small shuffling steps and RW along EOB.  Assist of 2 to return to bed.  Pt requesting PT to speak to dtr on phone and dtr reports pt was recently dc form [facility] and was ambulating 75' with rollator and assist of one but subsequent to fall at home has been non-ambulatory.  Based on current level of assist, pt and dtr agree that Patient will benefit from continued inpatient follow up therapy, <3 hours/day.     If plan is discharge home, recommend the following: Two people to help with walking and/or transfers;A lot of help with bathing/dressing/bathroom;Assistance with cooking/housework;Assist for transportation;Help with stairs or ramp for entrance   Can travel by private vehicle     No  Equipment Recommendations  None recommended by PT    Recommendations for Other Services       Precautions / Restrictions Precautions Precautions: Fall Restrictions Weight Bearing Restrictions Per Provider Order: No Other Position/Activity  Restrictions: Per admission in January: Post op shoe R foot but no weight bearing precautions     Mobility  Bed Mobility Overal bed mobility: Needs Assistance Bed Mobility: Rolling, Supine to Sit, Sit to Supine Rolling: Min assist, Mod assist, Used rails   Supine to sit: HOB elevated, Min assist, Mod assist Sit to supine: Mod assist, Max assist, +2 for physical assistance   General bed mobility comments: Assist to roll side to side for hygiene; INcreased time with use of bed rail and bed pad to move to EOB sitting.  Increased assist to assist LEs and control trunk to return to supine    Transfers Overall transfer level: Needs assistance Equipment used: Rolling walker (2 wheels) Transfers: Sit to/from Stand Sit to Stand: Mod assist, +2 physical assistance, +2 safety/equipment, From elevated surface           General transfer comment: Elevated bed, blocking feet to prevent sliding, physical assist of 2 to bring wt up and fwd and to correct for significant posterior push in initial standing.  With cueing, pt able to walk LEs under and balance with min assist of 2 and RW for support    Ambulation/Gait Ambulation/Gait assistance: Min assist, +2 physical assistance, +2 safety/equipment Gait Distance (Feet): 3 Feet Assistive device: Rolling walker (2 wheels) Gait Pattern/deviations: Step-to pattern, Decreased step length - right, Decreased step length - left, Shuffle, Trunk flexed       General Gait Details: With increased time and cues, pt able to shuffle sid-step along side of bed but with fatigue dropped to sitting  with minimal warning.   Stairs             Wheelchair Mobility     Tilt Bed    Modified Rankin (Stroke Patients Only)       Balance Overall balance assessment: Needs assistance Sitting-balance support: No upper extremity supported Sitting balance-Leahy Scale: Fair Sitting balance - Comments: Preferred UE support but could sit with posterior  tendency     Standing balance-Leahy Scale: Poor Standing balance comment: RW, initial standing with significant posterior push but physical assist and cues to walk LES under COG and balance in RW with min assist of 2 for safety                            Communication Communication Communication: No apparent difficulties  Cognition Arousal: Alert Behavior During Therapy: WFL for tasks assessed/performed   PT - Cognitive impairments: No family/caregiver present to determine baseline                         Following commands: Intact      Cueing Cueing Techniques: Verbal cues  Exercises      General Comments        Pertinent Vitals/Pain Pain Assessment Pain Assessment: No/denies pain    Home Living                          Prior Function            PT Goals (current goals can now be found in the care plan section) Acute Rehab PT Goals Patient Stated Goal: did not state PT Goal Formulation: With patient Time For Goal Achievement: 03/14/23 Potential to Achieve Goals: Fair Progress towards PT goals: Progressing toward goals    Frequency    Min 1X/week      PT Plan      Co-evaluation              AM-PAC PT "6 Clicks" Mobility   Outcome Measure  Help needed turning from your back to your side while in a flat bed without using bedrails?: A Lot Help needed moving from lying on your back to sitting on the side of a flat bed without using bedrails?: A Lot Help needed moving to and from a bed to a chair (including a wheelchair)?: Total Help needed standing up from a chair using your arms (e.g., wheelchair or bedside chair)?: Total Help needed to walk in hospital room?: Total Help needed climbing 3-5 steps with a railing? : Total 6 Click Score: 8    End of Session Equipment Utilized During Treatment: Gait belt Activity Tolerance: Patient limited by fatigue Patient left: in bed;with call bell/phone within reach;with bed  alarm set;with nursing/sitter in room Nurse Communication: Mobility status;Need for lift equipment PT Visit Diagnosis: Unsteadiness on feet (R26.81);Repeated falls (R29.6);Muscle weakness (generalized) (M62.81);History of falling (Z91.81);Difficulty in walking, not elsewhere classified (R26.2)     Time: 0981-1914 PT Time Calculation (min) (ACUTE ONLY): 50 min  Charges:    $Therapeutic Activity: 38-52 mins PT General Charges $$ ACUTE PT VISIT: 1 Visit                     Mauro Kaufmann PT Acute Rehabilitation Services Pager [phone] Office [phone]    [facility] 03/02/2023, 5:40 PM

## 2023-03-02 NOTE — Plan of Care (Signed)
  Problem: Clinical Measurements: Goal: Ability to maintain clinical measurements within normal limits will improve 03/02/2023 1448 by Paul Half, LPN Outcome: Progressing 03/02/2023 1204 by Paul Half, LPN Outcome: Progressing Goal: Will remain free from infection 03/02/2023 1448 by Paul Half, LPN Outcome: Progressing 03/02/2023 1204 by Paul Half, LPN Outcome: Progressing Goal: Diagnostic test results will improve 03/02/2023 1448 by Paul Half, LPN Outcome: Progressing 03/02/2023 1204 by Paul Half, LPN Outcome: Progressing Goal: Respiratory complications will improve 03/02/2023 1448 by Paul Half, LPN Outcome: Progressing 03/02/2023 1204 by Paul Half, LPN Outcome: Progressing Goal: Cardiovascular complication will be avoided 03/02/2023 1448 by Paul Half, LPN Outcome: Progressing 03/02/2023 1204 by Paul Half, LPN Outcome: Progressing

## 2023-03-02 NOTE — Plan of Care (Signed)

## 2023-03-02 NOTE — Progress Notes (Signed)
 PT in to eval pt again this afternoon, will await their recommendation. Per pt's daughter, they are requesting that pt be sent to Rehab/SNF, they are specifically requesting [facility] as a preference.

## 2023-03-03 DIAGNOSIS — N3000 Acute cystitis without hematuria: Secondary | ICD-10-CM | POA: Diagnosis not present

## 2023-03-03 LAB — CBC
HCT: 32.3 % — ABNORMAL LOW (ref 36.0–46.0)
Hemoglobin: 10.5 g/dL — ABNORMAL LOW (ref 12.0–15.0)
MCH: 31.6 pg (ref 26.0–34.0)
MCHC: 32.5 g/dL (ref 30.0–36.0)
MCV: 97.3 fL (ref 80.0–100.0)
Platelets: 250 10*3/uL (ref 150–400)
RBC: 3.32 MIL/uL — ABNORMAL LOW (ref 3.87–5.11)
RDW: 13 % (ref 11.5–15.5)
WBC: 3.8 10*3/uL — ABNORMAL LOW (ref 4.0–10.5)
nRBC: 0 % (ref 0.0–0.2)

## 2023-03-03 LAB — BASIC METABOLIC PANEL
Anion gap: 9 (ref 5–15)
BUN: 37 mg/dL — ABNORMAL HIGH (ref 8–23)
CO2: 24 mmol/L (ref 22–32)
Calcium: 9.6 mg/dL (ref 8.9–10.3)
Chloride: 104 mmol/L (ref 98–111)
Creatinine, Ser: 1.33 mg/dL — ABNORMAL HIGH (ref 0.44–1.00)
GFR, Estimated: 39 mL/min — ABNORMAL LOW (ref >60)
Glucose, Bld: 103 mg/dL — ABNORMAL HIGH (ref 70–99)
Potassium: 3.9 mmol/L (ref 3.5–5.1)
Sodium: 137 mmol/L (ref 135–145)

## 2023-03-03 NOTE — Progress Notes (Addendum)
 PROGRESS NOTE    Susan [name]  GNF:[id number] DOB: 09/14/1940 DOA: 02/27/2023 PCP: Raymon Mutton., FNP   [name] is a 83 y.o. female with medical history significant for chronic HFpEF, paroxysmal A-fib on Eliquis, hyperlipidemia, CKD 3B, anemia of chronic disease, fracture of the distal shaft of the fifth metacarpal from a fall in January 2025, who initially presented to [facility] ED from SNF via EMS due to generalized weakness contributing to another fall 2 days ago.  Reportedly she also had urinary frequency and foul odor to her urine for the past few days.  Hospitalist called for admission.  Patient admitted as above from home with confusion weakness found to have UTI, improved drastically after treatment of UTI but continues to be quite weak.  PT recommending patient return back to SNF for ongoing physical therapy and rehab.  Patient otherwise agreeable to return back to prior facility for ongoing therapy with ultimate disposition back home with family.  Patient previously being evaluated for discharge to SNF, unfortunately patient has no skilled days left per discussion with TOC.  As such patient will be plan for discharge home later this week once home equipment can be delivered and home health can be set up.  At this time she is medically stable for discharge.  Assessment & Plan:   Principal Problem:   Acute cystitis without hematuria  UTI, POA resolved -Does not meet sepsis criteria -UA positive for pyuria -Follow urine culture for ID and sensitivities -Completed antibiotic course with Rocephin   Acute metabolic encephalopathy, POA, resolving Baseline dementia, unspecified -Secondary to above, continue to follow -Improving markedly, likely consistent with UTI as primary driving force of mental status changes  Generalized weakness, possibly contributed by presumed UTI High fall risk Continue to treat underlying condition given above PT OT assessment  ongoing -currently recommending SNF Fall precautions ongoing   CKD 3B Baseline creatinine around 1.5-1.6 currently at baseline Continue to follow IV fluids discontinued, p.o. intake much more appropriate over the past 12 hours   Hypertension, BP is not at goal, elevated Resume home oral antihypertensives Monitor vital signs   Chronic HFpEF Last 2D echo done in 2021 revealed LVEF 55 to 60% with grade 1 diastolic dysfunction Closely monitor volume status while on IV fluid hydration Start strict I's and O's and daily weight   Hyperlipidemia Resume home pravastatin.   Anemia of chronic disease Hemoglobin is at baseline No reported bleeding   Right heel pressure wound, POA Wound care specialist consulted Continue local wound care As needed analgesics and as needed bowel regimen.  DVT prophylaxis: apixaban (ELIQUIS) tablet 2.5 mg Start: 02/28/23 0400 apixaban (ELIQUIS) tablet 2.5 mg  Code Status:   Code Status: Full Code Family Communication: Daughter updated over the phone  Status is: Inpatient  Dispo: The patient is from: Home              Anticipated d/c is to: SNF pending further PT evaluation              Anticipated d/c date is: 48 to 72 hours pending clinical course              Patient currently not medically stable for discharge  Consultants:  None  Procedures:  None  Antimicrobials:  Ceftriaxone  Subjective: No acute issues or events overnight, mental status continues to improve drastically, likely at baseline today.  Patient awaiting reevaluation by PT given marked improvement she is hopeful to discharge home  rather than return to nursing facility/rehab  Objective: Vitals:   03/02/23 0904 03/02/23 1348 03/02/23 2110 03/03/23 0718  BP:  (!) 166/94 (!) 142/83 (!) 158/85  Pulse:  79 70 67  Resp:  15 17 18   Temp:  99 F (37.2 C) 98.6 F (37 C) 98.4 F (36.9 C)  TempSrc:      SpO2: 96% 99% 100% 100%  Weight:      Height:        Intake/Output  Summary (Last 24 hours) at 03/03/2023 0744 Last data filed at 03/03/2023 0600 Gross per 24 hour  Intake 490 ml  Output 1350 ml  Net -860 ml   Filed Weights   02/27/23 2331  Weight: 72 kg    Examination:  General:  Pleasantly resting in bed, No acute distress.  ANO x 3 HEENT:  Normocephalic atraumatic.  Sclerae nonicteric, noninjected.  Extraocular movements intact bilaterally. Neck:  Without mass or deformity.  Trachea is midline. Lungs:  Clear to auscultate bilaterally without rhonchi, wheeze, or rales. Heart:  Regular rate and rhythm.  Without murmurs, rubs, or gallops. Abdomen:  Soft, nontender, nondistended.  Without guarding or rebound. Extremities: Without cyanosis, clubbing, edema, or obvious deformity. Skin:  Warm and dry, no erythema.  Data Reviewed: I have personally reviewed following labs and imaging studies  CBC: Recent Labs  Lab 02/27/23 1739 02/28/23 0644 03/01/23 0346 03/02/23 0320  WBC 8.5 7.6 4.6 4.0  NEUTROABS 6.6  --   --   --   HGB 11.7* 11.1* 10.6* 10.4*  HCT 36.2 34.8* 33.7* 33.6*  MCV 96.0 98.6 100.6* 99.7  PLT 254 257 220 230   Basic Metabolic Panel: Recent Labs  Lab 02/27/23 1739 02/28/23 0644 03/01/23 0346 03/02/23 0320  NA 138 139 141 139  K 3.5 3.5 3.6 3.7  CL 100 100 105 105  CO2 29 26 26 25   GLUCOSE 97 109* 99 102*  BUN 35* 40* 37* 35*  CREATININE 1.49* 1.51* 1.33* 1.20*  CALCIUM 10.9* 10.1 9.6 9.5  MG  --  2.1  --   --   PHOS  --  3.3  --   --    GFR: Estimated Creatinine Clearance: 31.1 mL/min (A) (by C-G formula based on SCr of 1.2 mg/dL (H)). Liver Function Tests: Recent Labs  Lab 02/27/23 1739  AST 17  ALT 12  ALKPHOS 74  BILITOT 0.6  PROT 7.7  ALBUMIN 4.4   Sepsis Labs: Recent Labs  Lab 02/27/23 1739  LATICACIDVEN 0.7    Recent Results (from the past 240 hours)  Urine Culture     Status: Abnormal   Collection Time: 02/27/23  8:30 PM   Specimen: Urine, Random  Result Value Ref Range Status   Specimen  Description   Final    URINE, RANDOM Performed at [facility], [address] [place], [place] [address]    Special Requests   Final    NONE Reflexed from [id number] Performed at [facility], [address] [place], [place] [address]    Culture >=100,000 COLONIES/mL ESCHERICHIA COLI (A)  Final   Report Status 03/02/2023 FINAL  Final   Organism ID, Bacteria ESCHERICHIA COLI (A)  Final      Susceptibility   Escherichia coli - MIC*    AMPICILLIN >=32 RESISTANT Resistant     CEFAZOLIN 16 SENSITIVE Sensitive     CEFEPIME <=0.12 SENSITIVE Sensitive     CEFTRIAXONE <=0.25 SENSITIVE Sensitive     CIPROFLOXACIN <=0.25 SENSITIVE Sensitive  GENTAMICIN >=16 RESISTANT Resistant     IMIPENEM <=0.25 SENSITIVE Sensitive     NITROFURANTOIN <=16 SENSITIVE Sensitive     TRIMETH/SULFA >=320 RESISTANT Resistant     AMPICILLIN/SULBACTAM >=32 RESISTANT Resistant     PIP/TAZO <=4 SENSITIVE Sensitive ug/mL    * >=100,000 COLONIES/mL ESCHERICHIA COLI     Radiology Studies: No results found.  Scheduled Meds:  amLODipine  5 mg Oral Daily   apixaban  2.5 mg Oral BID   ARIPiprazole  10 mg Oral QPM   donepezil  10 mg Oral QHS   fluticasone furoate-vilanterol  1 puff Inhalation Daily   losartan  50 mg Oral Daily   pravastatin  40 mg Oral Daily   sertraline  100 mg Oral Daily   Continuous Infusions:  cefTRIAXone (ROCEPHIN)  IV 1 g (03/02/23 0949)     LOS: 4 days   Time spent:  Azucena Fallen, DO [facility]  If 7PM-7AM, please contact night-coverage [email or web address]  03/03/2023, 7:44 AM

## 2023-03-03 NOTE — TOC Progression Note (Addendum)
 Transition of Care [facility]) - Progression Note    Patient Details  Name: Susan [name] MRN: [id number] Date of Birth: 01-06-1941  Transition of Care [facility]) CM/SW Contact  Howell Rucks, RN Phone Number: 03/03/2023, 2:36 PM  Clinical Narrative:   Call from pt's dtrs, [name] and Kennith Center, requesting assistance/options for discharge disposition. PT recommendation for short term rehab/SNF, family reports pt currently has run out of her SNF bed days and that she was recently discharged from [facility] on 02/24/23. Kennith Center states she spoke with a representative at [email or web address] and was told that if patient changes to Traditional Medicare, it will become effective on 03/08/23 with an immediate reup of her 100 SNF days. NCM confirmed with admissions coordinator at a couple of SNF that once SNF bed days are used, they are used and changing medicare plans does not allow for new 100 SNF bed days, [name] and [place] voiced understanding. 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 Home DME: hospital bed, rolaltor, wheelchair, BSC.   [name] and Kennith Center report they have initiated application for Medicaid, reports they will discuss options with the rest of the family. NCM will update team on discussion with family. TOC will continue to follow.   -4:20 [facility], repMarylene Land, accepted for [facility] PT/OT/RN/HHA/SLP/SW , added to AVS.     Expected Discharge Plan: Home w Home Health Services Barriers to Discharge: Continued Medical Work up  Expected Discharge Plan and Services       Living arrangements for the past 2 months: Apartment                                       Social Determinants of Health (SDOH) Interventions SDOH Screenings   Food Insecurity: No Food Insecurity (02/27/2023)  Housing: Low Risk   (02/28/2023)  Transportation Needs: No Transportation Needs (02/27/2023)  Utilities: Not At Risk (02/27/2023)  Alcohol Screen: Low Risk  (10/21/2017)  Depression (PHQ2-9): Medium Risk (07/05/2021)  Financial Resource Strain: Low Risk  (11/17/2017)  Physical Activity: Insufficiently Active (11/17/2017)  Social Connections: Moderately Integrated (02/27/2023)  Stress: Stress Concern Present (11/17/2017)  Tobacco Use: Medium Risk (02/28/2023)    Readmission Risk Interventions    02/28/2023   10:04 AM  Readmission Risk Prevention Plan  Transportation Screening Complete  PCP or Specialist Appt within 3-5 Days Complete  HRI or Home Care Consult Complete  Social Work Consult for Recovery Care Planning/Counseling Complete  Palliative Care Screening Not Applicable  Medication Review Oceanographer) Complete

## 2023-03-03 NOTE — Progress Notes (Signed)
   03/03/23 2232  BiPAP/CPAP/SIPAP  Reason BIPAP/CPAP not in use Non-compliant   Pt refused cpap tonight, RN notified.

## 2023-03-04 DIAGNOSIS — N3000 Acute cystitis without hematuria: Secondary | ICD-10-CM | POA: Diagnosis not present

## 2023-03-04 LAB — BASIC METABOLIC PANEL
Anion gap: 10 (ref 5–15)
BUN: 36 mg/dL — ABNORMAL HIGH (ref 8–23)
CO2: 24 mmol/L (ref 22–32)
Calcium: 9.9 mg/dL (ref 8.9–10.3)
Chloride: 107 mmol/L (ref 98–111)
Creatinine, Ser: 0.94 mg/dL (ref 0.44–1.00)
GFR, Estimated: 59 mL/min — ABNORMAL LOW (ref >60)
Glucose, Bld: 95 mg/dL (ref 70–99)
Potassium: 4 mmol/L (ref 3.5–5.1)
Sodium: 141 mmol/L (ref 135–145)

## 2023-03-04 LAB — CBC
HCT: 32.6 % — ABNORMAL LOW (ref 36.0–46.0)
Hemoglobin: 10.4 g/dL — ABNORMAL LOW (ref 12.0–15.0)
MCH: 31.6 pg (ref 26.0–34.0)
MCHC: 31.9 g/dL (ref 30.0–36.0)
MCV: 99.1 fL (ref 80.0–100.0)
Platelets: 246 10*3/uL (ref 150–400)
RBC: 3.29 MIL/uL — ABNORMAL LOW (ref 3.87–5.11)
RDW: 12.8 % (ref 11.5–15.5)
WBC: 4.4 10*3/uL (ref 4.0–10.5)
nRBC: 0 % (ref 0.0–0.2)

## 2023-03-04 NOTE — Progress Notes (Signed)
   03/04/23 2219  BiPAP/CPAP/SIPAP  Reason BIPAP/CPAP not in use Non-compliant   Pt refused cpap.  Pt was encouraged to call should she change her mind.  RN aware.

## 2023-03-04 NOTE — Progress Notes (Signed)
 PROGRESS NOTE    Susan [name]  WUJ:[id number] DOB: Nov 22, 1940 DOA: 02/27/2023 PCP: Raymon Mutton., FNP   Brief Narrative:  Susan [name] is a 83 y.o. female with medical history significant for chronic HFpEF, paroxysmal A-fib on Eliquis, hyperlipidemia, CKD 3B, anemia of chronic disease, fracture of the distal shaft of the fifth metacarpal from a fall in January 2025, who initially presented to [facility] ED from SNF via EMS due to generalized weakness contributing to another fall 2 days ago.  Reportedly she also had urinary frequency and foul odor to her urine for the past few days.  Hospitalist called for admission.  Patient admitted as above from home with confusion weakness found to have UTI, improved drastically after treatment of UTI but continues to be quite weak.  PT recommending patient return back to SNF for ongoing physical therapy and rehab.  Patient otherwise agreeable to return back to prior facility for ongoing therapy with ultimate disposition back home with family.  Patient previously being evaluated for discharge to SNF, unfortunately patient has no skilled days left per discussion with TOC.  As such patient will be plan for discharge home later this week once home equipment can be delivered and home health can be set up.  At this time she is medically stable for discharge.  Assessment & Plan:   Principal Problem:   Acute cystitis without hematuria  UTI, POA resolved -Does not meet sepsis criteria -UA positive for pyuria -Follow urine culture for ID and sensitivities -Completed antibiotic course with Rocephin   Acute metabolic encephalopathy, POA, resolving Baseline dementia, unspecified -Secondary to above, continue to follow -Improving markedly, likely consistent with UTI as primary driving force of mental status changes  Generalized weakness, possibly contributed by presumed UTI High fall risk Continue to treat underlying condition given above PT OT assessment  ongoing -currently recommending SNF Fall precautions ongoing   CKD 3B Baseline creatinine around 1.5-1.6 currently at baseline Continue to follow IV fluids discontinued, p.o. intake much more appropriate over the past 12 hours   Hypertension, BP is not at goal, elevated Resume home oral antihypertensives Monitor vital signs   Chronic HFpEF Last 2D echo done in 2021 revealed LVEF 55 to 60% with grade 1 diastolic dysfunction Closely monitor volume status while on IV fluid hydration Start strict I's and O's and daily weight   Hyperlipidemia Resume home pravastatin.   Anemia of chronic disease Hemoglobin is at baseline No reported bleeding   Right heel pressure wound, POA Wound care specialist consulted Continue local wound care As needed analgesics and as needed bowel regimen.  DVT prophylaxis: apixaban (ELIQUIS) tablet 2.5 mg Start: 02/28/23 0400 apixaban (ELIQUIS) tablet 2.5 mg  Code Status:   Code Status: Full Code Family Communication: Daughter updated over the phone  Status is: Inpatient  Dispo: The patient is from: Home              Anticipated d/c is to: Home with HHPT              Anticipated d/c date is: imminent              Patient currently not medically stable for discharge  Consultants:  None  Procedures:  None  Antimicrobials:  Completed  Subjective: No acute issues or events overnight, mental status continues to improve -likely at baseline.  Otherwise denies nausea vomiting diarrhea constipation headache fever chills or chest pain  Objective: Vitals:   03/03/23 1309 03/03/23 2236 03/03/23 2335 03/04/23  0620  BP: (!) 163/80 (!) 142/83 (!) 143/73 (!) 157/75  Pulse: 80 82 77 (!) 59  Resp: 17 17 16 17   Temp: 99.5 F (37.5 C) 99.4 F (37.4 C)  98 F (36.7 C)  TempSrc:  Oral  Oral  SpO2: 100% 100% 100% 100%  Weight:      Height:        Intake/Output Summary (Last 24 hours) at 03/04/2023 0732 Last data filed at 03/04/2023 0600 Gross per 24  hour  Intake 540 ml  Output 1075 ml  Net -535 ml   Filed Weights   02/27/23 2331  Weight: 72 kg    Examination:  General:  Pleasantly resting in bed, No acute distress.  ANO x 3 HEENT:  Normocephalic atraumatic.  Sclerae nonicteric, noninjected.  Extraocular movements intact bilaterally. Neck:  Without mass or deformity.  Trachea is midline. Lungs:  Clear to auscultate bilaterally without rhonchi, wheeze, or rales. Heart:  Regular rate and rhythm.  Without murmurs, rubs, or gallops. Abdomen:  Soft, nontender, nondistended.  Without guarding or rebound. Extremities: Without cyanosis, clubbing, edema, or obvious deformity. Skin:  Warm and dry, no erythema.  Data Reviewed: I have personally reviewed following labs and imaging studies  CBC: Recent Labs  Lab 02/27/23 1739 02/28/23 0644 03/01/23 0346 03/02/23 0320 03/03/23 0753 03/04/23 0616  WBC 8.5 7.6 4.6 4.0 3.8* 4.4  NEUTROABS 6.6  --   --   --   --   --   HGB 11.7* 11.1* 10.6* 10.4* 10.5* 10.4*  HCT 36.2 34.8* 33.7* 33.6* 32.3* 32.6*  MCV 96.0 98.6 100.6* 99.7 97.3 99.1  PLT 254 257 220 230 250 246   Basic Metabolic Panel: Recent Labs  Lab 02/28/23 0644 03/01/23 0346 03/02/23 0320 03/03/23 0753 03/04/23 0616  NA 139 141 139 137 141  K 3.5 3.6 3.7 3.9 4.0  CL 100 105 105 104 107  CO2 26 26 25 24 24   GLUCOSE 109* 99 102* 103* 95  BUN 40* 37* 35* 37* 36*  CREATININE 1.51* 1.33* 1.20* 1.33* 0.94  CALCIUM 10.1 9.6 9.5 9.6 9.9  MG 2.1  --   --   --   --   PHOS 3.3  --   --   --   --    GFR: Estimated Creatinine Clearance: 39.7 mL/min (by C-G formula based on SCr of 0.94 mg/dL). Liver Function Tests: Recent Labs  Lab 02/27/23 1739  AST 17  ALT 12  ALKPHOS 74  BILITOT 0.6  PROT 7.7  ALBUMIN 4.4   Sepsis Labs: Recent Labs  Lab 02/27/23 1739  LATICACIDVEN 0.7    Recent Results (from the past 240 hours)  Urine Culture     Status: Abnormal   Collection Time: 02/27/23  8:30 PM   Specimen: Urine,  Random  Result Value Ref Range Status   Specimen Description   Final    URINE, RANDOM Performed at [facility], [address], [place], [place] [address]    Special Requests   Final    NONE Reflexed from [id number] Performed at [facility], [address], [place], [place] [address]    Culture >=100,000 COLONIES/mL ESCHERICHIA COLI (A)  Final   Report Status 03/02/2023 FINAL  Final   Organism ID, Bacteria ESCHERICHIA COLI (A)  Final      Susceptibility   Escherichia coli - MIC*    AMPICILLIN >=32 RESISTANT Resistant     CEFAZOLIN 16 SENSITIVE Sensitive     CEFEPIME <=0.12  SENSITIVE Sensitive     CEFTRIAXONE <=0.25 SENSITIVE Sensitive     CIPROFLOXACIN <=0.25 SENSITIVE Sensitive     GENTAMICIN >=16 RESISTANT Resistant     IMIPENEM <=0.25 SENSITIVE Sensitive     NITROFURANTOIN <=16 SENSITIVE Sensitive     TRIMETH/SULFA >=320 RESISTANT Resistant     AMPICILLIN/SULBACTAM >=32 RESISTANT Resistant     PIP/TAZO <=4 SENSITIVE Sensitive ug/mL    * >=100,000 COLONIES/mL ESCHERICHIA COLI     Radiology Studies: No results found.  Scheduled Meds:  amLODipine  5 mg Oral Daily   apixaban  2.5 mg Oral BID   ARIPiprazole  10 mg Oral QPM   donepezil  10 mg Oral QHS   fluticasone furoate-vilanterol  1 puff Inhalation Daily   losartan  50 mg Oral Daily   pravastatin  40 mg Oral Daily   sertraline  100 mg Oral Daily   Continuous Infusions:     LOS: 5 days   Time spent:  Azucena Fallen, DO [facility]  If 7PM-7AM, please contact night-coverage [email or web address]  03/04/2023, 7:32 AM

## 2023-03-04 NOTE — Progress Notes (Signed)
 Physical Therapy Treatment Patient Details Name: BELLAMIA FERCH MRN: [id number] DOB: 06-15-1940 Today's Date: 03/04/2023   History of Present Illness Pt is 83 yo female admitted on 02/27/23 with weakness and falls.  Pt with presumptive UTI.  Pt with hx including but not limited to for chronic HFpEF, paroxysmal A-fib on Eliquis, hyperlipidemia, CKD 3B, anemia of chronic disease, fracture of the distal shaft of the fifth metacarpal from a fall in January 2025, and R heel wound.    PT Comments  PT - Cognition Comments: AxO x 2 pleasant but very fearful. Assisted OOB was very difficult.  General bed mobility comments: Pt required Max Asisst to transfer from supine to EOB with great difficulty scooting to EOB. General transfer comment: Pt required + 2 side by side Max Assist to rise from elevated bed present with B feet "skiing".  Required Max Asisst to shift weight forward "nose over toes" as pt was pushing backward.  Also present with increased anxiety/fear of falling.  Assisted with a toilet transfer was VERY difficult requiring + 2 Max Assist as well as assist for peri care.  Posture is poor. General Gait Details: Very limited amb distance requiring + 2 Max Asisst to amb 9 feet to bathroom then another 9 feet out with poor ability to safely advance either LE and decreased stance/weight shift onto R LE.  Severe posterior lean.  HIGH FALL RISK  Per TOC note, Pt has used "all her SNF days" and now plans to D/C to home with 24/7 Family support. Pt will need PTAR transport.     If plan is discharge home, recommend the following:     Can travel by private vehicle     No  Equipment Recommendations       Recommendations for Other Services       Precautions / Restrictions Precautions Precautions: Fall Precaution/Restrictions Comments: R Metatarsal Fx Post Op shoes "are not here" Restrictions Weight Bearing Restrictions Per Provider Order: No Other Position/Activity Restrictions: Per admission in  January: Post op shoe R foot but no weight bearing precautions     Mobility  Bed Mobility Overal bed mobility: Needs Assistance Bed Mobility: Supine to Sit     Supine to sit: Max assist     General bed mobility comments: Pt required Max Asisst to transfer from supine to EOB with great difficulty scooting to EOB.    Transfers Overall transfer level: Needs assistance Equipment used: Rolling walker (2 wheels) Transfers: Sit to/from Stand Sit to Stand: Max assist, +2 physical assistance, +2 safety/equipment, From elevated surface           General transfer comment: Pt required + 2 side by side Max Assist to rise from elevated bed present with B feet "skiing".  Required Max Asisst to shift weight forward "nose over toes" as pt was pushing backward.  Also present with increased anxiety/fear of falling.  Assisted with a toilet transfer was VERY difficult requiring + 2 Max Assist as well as assist for peri care.  Posture is poor.    Ambulation/Gait Ambulation/Gait assistance: Max assist, +2 physical assistance, +2 safety/equipment Gait Distance (Feet): 18 Feet (9 feet x 2) Assistive device: Rolling walker (2 wheels)   Gait velocity: decreased     General Gait Details: Very limited amb distance requiring + 2 Max Asisst to amb 9 feet to bathroom then another 9 feet out with poor ability to safely advance either LE and decreased stance/weight shift onto R LE.  Severe posterior lean.  HIGH FALL RISK   Stairs             Wheelchair Mobility     Tilt Bed    Modified Rankin (Stroke Patients Only)       Balance                                            Communication Communication Communication: No apparent difficulties  Cognition Arousal: Alert Behavior During Therapy: WFL for tasks assessed/performed   PT - Cognitive impairments: No family/caregiver present to determine baseline                       PT - Cognition Comments: AxO x 2  pleasant but very fearful. Following commands: Intact      Cueing Cueing Techniques: Verbal cues  Exercises      General Comments        Pertinent Vitals/Pain Pain Assessment Pain Assessment: Faces Faces Pain Scale: Hurts a little bit Pain Location: Bil LE "general" Pain Descriptors / Indicators: Discomfort, Grimacing, Guarding Pain Intervention(s): Monitored during session, Repositioned    Home Living                          Prior Function            PT Goals (current goals can now be found in the care plan section) Progress towards PT goals: Progressing toward goals    Frequency    Min 1X/week      PT Plan      Co-evaluation              AM-PAC PT "6 Clicks" Mobility   Outcome Measure  Help needed turning from your back to your side while in a flat bed without using bedrails?: Total Help needed moving from lying on your back to sitting on the side of a flat bed without using bedrails?: Total Help needed moving to and from a bed to a chair (including a wheelchair)?: Total Help needed standing up from a chair using your arms (e.g., wheelchair or bedside chair)?: Total Help needed to walk in hospital room?: Total Help needed climbing 3-5 steps with a railing? : Total 6 Click Score: 6    End of Session Equipment Utilized During Treatment: Gait belt Activity Tolerance: Patient limited by fatigue Patient left: in chair;with call bell/phone within reach;with chair alarm set Nurse Communication: Mobility status;Need for lift equipment PT Visit Diagnosis: Unsteadiness on feet (R26.81);Repeated falls (R29.6);Muscle weakness (generalized) (M62.81);History of falling (Z91.81);Difficulty in walking, not elsewhere classified (R26.2)     Time: 1610-9604 PT Time Calculation (min) (ACUTE ONLY): 26 min  Charges:    $Gait Training: 8-22 mins $Therapeutic Activity: 8-22 mins PT General Charges $$ ACUTE PT VISIT: 1 Visit                     Felecia Shelling  PTA Acute  Rehabilitation Services Office M-F          [phone]

## 2023-03-04 NOTE — TOC Progression Note (Signed)
 Transition of Care [facility]) - Progression Note    Patient Details  Name: CREASIE LACOSSE MRN: [id number] Date of Birth: 11/27/1940  Transition of Care [facility]) CM/SW Contact  Howell Rucks, RN Phone Number: 03/04/2023, 10:26 AM  Clinical Narrative:   Ernesto Rutherford, will deliver [facility] Lift to home and provide teaching to family, added to AVS.     Expected Discharge Plan: Home w Home Health Services Barriers to Discharge: Continued Medical Work up  Expected Discharge Plan and Services       Living arrangements for the past 2 months: Apartment                                       Social Determinants of Health (SDOH) Interventions SDOH Screenings   Food Insecurity: No Food Insecurity (02/27/2023)  Housing: Low Risk  (02/28/2023)  Transportation Needs: No Transportation Needs (02/27/2023)  Utilities: Not At Risk (02/27/2023)  Alcohol Screen: Low Risk  (10/21/2017)  Depression (PHQ2-9): Medium Risk (07/05/2021)  Financial Resource Strain: Low Risk  (11/17/2017)  Physical Activity: Insufficiently Active (11/17/2017)  Social Connections: Moderately Integrated (02/27/2023)  Stress: Stress Concern Present (11/17/2017)  Tobacco Use: Medium Risk (02/28/2023)    Readmission Risk Interventions    02/28/2023   10:04 AM  Readmission Risk Prevention Plan  Transportation Screening Complete  PCP or Specialist Appt within 3-5 Days Complete  HRI or Home Care Consult Complete  Social Work Consult for Recovery Care Planning/Counseling Complete  Palliative Care Screening Not Applicable  Medication Review Oceanographer) Complete

## 2023-03-05 DIAGNOSIS — L89899 Pressure ulcer of other site, unspecified stage: Secondary | ICD-10-CM

## 2023-03-05 DIAGNOSIS — R531 Weakness: Secondary | ICD-10-CM | POA: Diagnosis not present

## 2023-03-05 DIAGNOSIS — N3000 Acute cystitis without hematuria: Secondary | ICD-10-CM | POA: Diagnosis not present

## 2023-03-05 LAB — CBC
HCT: 33.9 % — ABNORMAL LOW (ref 36.0–46.0)
Hemoglobin: 10.8 g/dL — ABNORMAL LOW (ref 12.0–15.0)
MCH: 31.1 pg (ref 26.0–34.0)
MCHC: 31.9 g/dL (ref 30.0–36.0)
MCV: 97.7 fL (ref 80.0–100.0)
Platelets: 263 10*3/uL (ref 150–400)
RBC: 3.47 MIL/uL — ABNORMAL LOW (ref 3.87–5.11)
RDW: 12.8 % (ref 11.5–15.5)
WBC: 4.1 10*3/uL (ref 4.0–10.5)
nRBC: 0 % (ref 0.0–0.2)

## 2023-03-05 LAB — BASIC METABOLIC PANEL
Anion gap: 9 (ref 5–15)
BUN: 33 mg/dL — ABNORMAL HIGH (ref 8–23)
CO2: 24 mmol/L (ref 22–32)
Calcium: 9.8 mg/dL (ref 8.9–10.3)
Chloride: 104 mmol/L (ref 98–111)
Creatinine, Ser: 1.28 mg/dL — ABNORMAL HIGH (ref 0.44–1.00)
GFR, Estimated: 41 mL/min — ABNORMAL LOW (ref >60)
Glucose, Bld: 151 mg/dL — ABNORMAL HIGH (ref 70–99)
Potassium: 4 mmol/L (ref 3.5–5.1)
Sodium: 137 mmol/L (ref 135–145)

## 2023-03-05 NOTE — TOC Progression Note (Addendum)
 Transition of Care [facility]) - Progression Note    Patient Details  Name: Susan [name] MRN: [id number] Date of Birth: [birth year]  Transition of Care [facility]) CM/SW Contact  Howell Rucks, RN Phone Number: 03/05/2023, 11:36 AM  Clinical Narrative:  Call to pt's dtr, Delight Ovens, informed per MD notes pt is medically stable for dc,  HH PT/OT/ST/HHA/RN/SW has been arranged with [facility], [facility] lift has been arranged for delivery to pt's home, Kennith Center unable to confirm delivery. NCM will confirm with [employer]. Kennith Center reports family has an agency and is in the process of arranging 24/7 home care.  Tracey to speak with her family and return call to NCM to confirm family ready for dc home, await call back.   -3:23pm NCM received call from pt's sister, Delight Ovens, reports she was unable to confirm delivery of hoyer lift, reports family would like hoyer lift delivered prior to pt's dc home.   -3:24pm   Call to [employer], rep- Jermaine, confirmed Hoyer lift not delivered today, will be delivered tomorrow am. Vaughan Basta provided contact name and number to arrange home delivery of hoyer lift. Kennith Center, pt's sister notified, team notified.     Expected Discharge Plan: Home w Home Health Services Barriers to Discharge: Continued Medical Work up  Expected Discharge Plan and Services       Living arrangements for the past 2 months: Apartment                                       Social Determinants of Health (SDOH) Interventions SDOH Screenings   Food Insecurity: No Food Insecurity (02/27/2023)  Housing: Low Risk  (02/28/2023)  Transportation Needs: No Transportation Needs (02/27/2023)  Utilities: Not At Risk (02/27/2023)  Alcohol Screen: Low Risk  (10/21/2017)  Depression (PHQ2-9): Medium Risk (07/05/2021)  Financial Resource Strain: Low Risk  (11/17/2017)  Physical Activity: Insufficiently Active (11/17/2017)  Social Connections: Moderately Integrated (02/27/2023)  Stress: Stress Concern  Present (11/17/2017)  Tobacco Use: Medium Risk (02/28/2023)    Readmission Risk Interventions    02/28/2023   10:04 AM  Readmission Risk Prevention Plan  Transportation Screening Complete  PCP or Specialist Appt within 3-5 Days Complete  HRI or Home Care Consult Complete  Social Work Consult for Recovery Care Planning/Counseling Complete  Palliative Care Screening Not Applicable  Medication Review Oceanographer) Complete

## 2023-03-05 NOTE — Progress Notes (Signed)
 [facility]  PROGRESS NOTE  Susan [name] WGN:[id number] DOB: 1940/05/07 DOA: 02/27/2023 PCP: Raymon Mutton., FNP   Brief HPI:    83 y.o. female with medical history significant for chronic HFpEF, paroxysmal A-fib on Eliquis, hyperlipidemia, CKD 3B, anemia of chronic disease, fracture of the distal shaft of the fifth metacarpal from a fall in January 2025, who initially presented to [facility] ED from SNF via EMS due to generalized weakness contributing to another fall 2 days ago.  Reportedly she also had urinary frequency and foul odor to her urine for the past few days.  Hospitalist called for admission.   Patient admitted as above from home with confusion weakness found to have UTI, improved drastically after treatment of UTI but continues to be quite weak.  PT recommending patient return back to SNF for ongoing physical therapy and rehab.  Patient otherwise agreeable to return back to prior facility for ongoing therapy with ultimate disposition back home with family.   Patient previously being evaluated for discharge to SNF, unfortunately patient has no skilled days left per discussion with TOC.  As such patient will be plan for discharge home later this week once home equipment can be delivered and home health can be set up.  At this time she is medically stable for discharge.      Assessment/Plan:   UTI, POA resolved -Does not meet sepsis criteria -UA positive for pyuria -Urine culture was positive for E. coli, sensitive to Rocephin -Completed antibiotic course with Rocephin   Acute metabolic encephalopathy, POA, resolving Baseline dementia, unspecified -Secondary to above, continue to follow -Improving markedly, likely consistent with UTI as primary driving force of mental status changes   Generalized weakness, possibly contributed by presumed UTI High fall risk Continue to treat underlying condition given above PT OT assessment  Fall precautions ongoing -HH  PT/OT/ST/HHA/RN/SW has been arranged with [facility], Hoyer lift has been arranged for delivery to pt's home  -Likely discharge home in a.m.   CKD 3B Baseline creatinine around 1.5-1.6 currently at baseline Continue to follow IV fluids discontinued, p.o. intake much more appropriate over the past 12 hours   Hypertension, BP is not at goal, elevated Resume home oral antihypertensives Monitor vital signs   Chronic HFpEF Last 2D echo done in 2021 revealed LVEF 55 to 60% with grade 1 diastolic dysfunction Closely monitor volume status while on IV fluid hydration Start strict I's and O's and daily weight   Hyperlipidemia Resume home pravastatin.   Anemia of chronic disease Hemoglobin is at baseline No reported bleeding   Right heel pressure wound, POA Wound care specialist consulted Continue local wound care As needed analgesics and as needed bowel regimen.    Medications     amLODipine  5 mg Oral Daily   apixaban  2.5 mg Oral BID   ARIPiprazole  10 mg Oral QPM   donepezil  10 mg Oral QHS   fluticasone furoate-vilanterol  1 puff Inhalation Daily   losartan  50 mg Oral Daily   pravastatin  40 mg Oral Daily   sertraline  100 mg Oral Daily     Data Reviewed:   CBG:  No results for input(s): "GLUCAP" in the last 168 hours.  SpO2: 99 %    Vitals:   03/04/23 1405 03/04/23 2208 03/05/23 0633 03/05/23 0845  BP: (!) 152/94 (!) 156/88 (!) 154/94   Pulse: 87 80 (!) 58   Resp: 16 16 17    Temp: 98.6 F (37 C)  99.2 F (37.3 C) 98.4 F (36.9 C)   TempSrc:  Oral Oral   SpO2: 94% 96% 99% 99%  Weight:      Height:          Data Reviewed:  Basic Metabolic Panel: Recent Labs  Lab 02/28/23 0644 03/01/23 0346 03/02/23 0320 03/03/23 0753 03/04/23 0616  NA 139 141 139 137 141  K 3.5 3.6 3.7 3.9 4.0  CL 100 105 105 104 107  CO2 26 26 25 24 24   GLUCOSE 109* 99 102* 103* 95  BUN 40* 37* 35* 37* 36*  CREATININE 1.51* 1.33* 1.20* 1.33* 0.94  CALCIUM 10.1 9.6 9.5  9.6 9.9  MG 2.1  --   --   --   --   PHOS 3.3  --   --   --   --     CBC: Recent Labs  Lab 02/27/23 1739 02/28/23 0644 03/01/23 0346 03/02/23 0320 03/03/23 0753 03/04/23 0616  WBC 8.5 7.6 4.6 4.0 3.8* 4.4  NEUTROABS 6.6  --   --   --   --   --   HGB 11.7* 11.1* 10.6* 10.4* 10.5* 10.4*  HCT 36.2 34.8* 33.7* 33.6* 32.3* 32.6*  MCV 96.0 98.6 100.6* 99.7 97.3 99.1  PLT 254 257 220 230 250 246    LFT Recent Labs  Lab 02/27/23 1739  AST 17  ALT 12  ALKPHOS 74  BILITOT 0.6  PROT 7.7  ALBUMIN 4.4     Antibiotics: Anti-infectives (From admission, onward)    Start     Dose/Rate Route Frequency Ordered Stop   02/28/23 1000  cefTRIAXone (ROCEPHIN) 1 g in sodium chloride 0.9 % 100 mL IVPB        1 g 200 mL/hr over 30 Minutes Intravenous Every 24 hours 02/28/23 0318 03/03/23 0952   02/27/23 2115  cefTRIAXone (ROCEPHIN) 1 g in sodium chloride 0.9 % 100 mL IVPB        1 g 200 mL/hr over 30 Minutes Intravenous  Once 02/27/23 2101 02/27/23 2235        DVT prophylaxis: Apixaban  Code Status: Full code  Family Communication: No family at bedside   CONSULTS    Subjective   Denies any complaints   Objective    Physical Examination:   General-appears in no acute distress Heart-S1-S2, regular, no murmur auscultated Lungs-clear to auscultation bilaterally, no wheezing or crackles auscultated Abdomen-soft, nontender, no organomegaly Extremities-no edema in the lower extremities Neuro-alert, oriented x3, no focal deficit noted   Status is: Inpatient:      Pressure Injury 02/28/23 Heel Right Stage 3 -  Full thickness tissue loss. Subcutaneous fat may be visible but bone, tendon or muscle are NOT exposed. healing Stage 3 (Active)  02/28/23   Location: Heel  Location Orientation: Right  Staging: Stage 3 -  Full thickness tissue loss. Subcutaneous fat may be visible but bone, tendon or muscle are NOT exposed.  Wound Description (Comments): healing Stage 3   Present on Admission: Yes        Floella Ensz S Lorrain Rivers   [facility] If 7PM-7AM, please contact night-coverage at [email or web address], Office  [phone]   03/05/2023, 9:29 AM  LOS: 6 days

## 2023-03-05 NOTE — Progress Notes (Signed)
 Occupational Therapy Treatment Patient Details Name: Susan [name] MRN: [id number] DOB: Mar 21, 1940 Today's Date: 03/05/2023   History of present illness Pt is 83 yo female admitted on 02/27/23 with weakness and falls.  Pt with presumptive UTI.  Pt with hx including but not limited to for chronic HFpEF, paroxysmal A-fib on Eliquis, hyperlipidemia, CKD 3B, anemia of chronic disease, fracture of the distal shaft of the fifth metatarsal from a fall in January 2025, and R heel wound.   OT comments  Pt is making some gains toward OT goals set at initial evaluation. Pt this session continued to require 2+ assistance with transfers, standing and even required use of STEDY for poor ability to move from sit to stand and transfer due to weakness, poor balance and activity tolerance. Pt with large BM incontinence as well and required extended time and assistance for hygiene and + 2 with STEDY to move to recliner. NT care coordination and OT communicated need for maxi move for recliner back to bed with nursing.Pt continues to benefit from skilled Acute OT. Patient will benefit from continued inpatient follow up therapy, <3 hours/day.        If plan is discharge home, recommend the following:  Two people to help with walking and/or transfers;A lot of help with bathing/dressing/bathroom;Assistance with cooking/housework;Assistance with feeding         Precautions / Restrictions Precautions Precautions: Fall Recall of Precautions/Restrictions: Impaired Restrictions Weight Bearing Restrictions Per Provider Order: No       Mobility Bed Mobility Overal bed mobility: Needs Assistance Bed Mobility: Rolling, Supine to Sit Rolling: Contact guard assist   Supine to sit: Mod assist     General bed mobility comments: significant increased tiime to scoot hips forward    Transfers Overall transfer level: Needs assistance Equipment used: Rolling walker (2 wheels) (STEDY + 2 after trials to stand with  +2) Transfers: Sit to/from Stand Sit to Stand: Max assist, +2 physical assistance, +2 safety/equipment, From elevated surface           General transfer comment: rec nursing still use maxi move due to +2 Transfer via Lift Equipment: Stedy   Balance Overall balance assessment: Needs assistance Sitting-balance support: No upper extremity supported Sitting balance-Leahy Scale: Fair     Standing balance support: Bilateral upper extremity supported Standing balance-Leahy Scale: Poor Standing balance comment: needed + 2 in STEDY                           ADL either performed or assessed with clinical judgement   ADL Overall ADL's : Needs assistance/impaired                 Upper Body Dressing : Moderate assistance;Sitting           Toileting- Clothing Manipulation and Hygiene: Total assistance Toileting - Clothing Manipulation Details (indicate cue type and reason): pt with large bowel incontinence requiring 2 person assist and use of STEDY for supported standing       General ADL Comments: unable to successfully mobilize sit to stand for peri and buttocks hygiene with + 2 support this session, utilized STEDY + 2    Extremity/Trunk Assessment Upper Extremity Assessment Upper Extremity Assessment: Generalized weakness   Lower Extremity Assessment Lower Extremity Assessment: Generalized weakness        Vision   Vision Assessment?: No apparent visual deficits   Perception Perception Perception: Impaired   Praxis Praxis Praxis: Impaired   Communication  Communication Communication: No apparent difficulties   Cognition Arousal: Alert Behavior During Therapy: WFL for tasks assessed/performed Cognition: Cognition impaired   Orientation impairments: Person, Place Awareness: Intellectual awareness impaired, Online awareness impaired Memory impairment (select all impairments): Short-term memory Attention impairment (select first level of  impairment): Sustained attention Executive functioning impairment (select all impairments): Organization, Sequencing, Reasoning, Problem solving OT - Cognition Comments: significant improvement in overall attention this session                 Following commands: Intact                 General Comments LE's tend to migrate from placement when attempting sit to stand, has R heel dressing in place for heel pu    Pertinent Vitals/ Pain       Pain Assessment Pain Assessment: No/denies pain   Frequency  Min 1X/week        Progress Toward Goals  OT Goals(current goals can now be found in the care plan section)  Progress towards OT goals: Progressing toward goals  Acute Rehab OT Goals Patient Stated Goal: to get stronger OT Goal Formulation: With patient Time For Goal Achievement: 03/14/23 Potential to Achieve Goals: Fair ADL Goals Pt Will Perform Grooming: with supervision;sitting Pt Will Perform Upper Body Bathing: with supervision;sitting Pt Will Perform Lower Body Bathing: sitting/lateral leans;with mod assist Pt Will Perform Upper Body Dressing: with supervision;sitting Pt Will Perform Lower Body Dressing: with mod assist;sitting/lateral leans;sit to/from stand Pt Will Transfer to Toilet: with mod assist;bedside commode  Plan         AM-PAC OT "6 Clicks" Daily Activity     Outcome Measure   Help from another person eating meals?: A Little Help from another person taking care of personal grooming?: A Little Help from another person toileting, which includes using toliet, bedpan, or urinal?: Total Help from another person bathing (including washing, rinsing, drying)?: Total Help from another person to put on and taking off regular upper body clothing?: A Lot Help from another person to put on and taking off regular lower body clothing?: Total 6 Click Score: 11    End of Session Equipment Utilized During Treatment: Gait belt;Other (comment);Rolling walker (2  wheels) (STEDY)  OT Visit Diagnosis: Unsteadiness on feet (R26.81);Other abnormalities of gait and mobility (R26.89);Muscle weakness (generalized) (M62.81);History of falling (Z91.81);Pain   Activity Tolerance Patient limited by fatigue   Patient Left in chair;with call bell/phone within reach;with chair alarm set   Nurse Communication Mobility status;Need for lift equipment (need for Pure wick replaced and had a large BM)        Time: 1210-1300 OT Time Calculation (min): 50 min  Charges: OT General Charges $OT Visit: 1 Visit OT Treatments $Self Care/Home Management : 38-52 mins  Malcolm Hetz OT/L Acute Rehabilitation Department  [phone]  03/05/2023, 2:02 PM

## 2023-03-05 NOTE — Progress Notes (Signed)
   03/05/23 2015  BiPAP/CPAP/SIPAP  BiPAP/CPAP/SIPAP Pt Type Adult  Reason BIPAP/CPAP not in use Non-compliant (Pt refusing cpap for the night)

## 2023-03-05 NOTE — TOC Progression Note (Signed)
 Transition of Care [facility]) - Progression Note    Patient Details  Name: LAMOINE FREDRICKSEN MRN: [id number] Date of Birth: 06/10/1940  Transition of Care [facility] CM/SW Contact  Howell Rucks, RN Phone Number: 03/05/2023, 11:05 AM  Clinical Narrative:   Call received from [facility] with [facility], confirmed pt on service for PT only, informed pt will need HH PT/OT/ST/RN/HHA/SW, per Jerilynn Som they can only provide [facility] PT/OT, informed pt will need all services for dc home, will need switch to new [facility] provider, Calvin voiced understanding.     Expected Discharge Plan: Home w Home Health Services Barriers to Discharge: Continued Medical Work up  Expected Discharge Plan and Services       Living arrangements for the past 2 months: Apartment                                       Social Determinants of Health (SDOH) Interventions SDOH Screenings   Food Insecurity: No Food Insecurity (02/27/2023)  Housing: Low Risk  (02/28/2023)  Transportation Needs: No Transportation Needs (02/27/2023)  Utilities: Not At Risk (02/27/2023)  Alcohol Screen: Low Risk  (10/21/2017)  Depression (PHQ2-9): Medium Risk (07/05/2021)  Financial Resource Strain: Low Risk  (11/17/2017)  Physical Activity: Insufficiently Active (11/17/2017)  Social Connections: Moderately Integrated (02/27/2023)  Stress: Stress Concern Present (11/17/2017)  Tobacco Use: Medium Risk (02/28/2023)    Readmission Risk Interventions    02/28/2023   10:04 AM  Readmission Risk Prevention Plan  Transportation Screening Complete  PCP or Specialist Appt within 3-5 Days Complete  HRI or Home Care Consult Complete  Social Work Consult for Recovery Care Planning/Counseling Complete  Palliative Care Screening Not Applicable  Medication Review Oceanographer) Complete

## 2023-03-06 DIAGNOSIS — R531 Weakness: Secondary | ICD-10-CM | POA: Diagnosis not present

## 2023-03-06 DIAGNOSIS — N3000 Acute cystitis without hematuria: Secondary | ICD-10-CM | POA: Diagnosis not present

## 2023-03-06 DIAGNOSIS — L89899 Pressure ulcer of other site, unspecified stage: Secondary | ICD-10-CM | POA: Diagnosis not present

## 2023-03-06 MED ORDER — HYDRALAZINE HCL 25 MG PO TABS
25.0000 mg | ORAL_TABLET | Freq: Three times a day (TID) | ORAL | Status: DC
  Administered 2023-03-06 – 2023-03-11 (×15): 25 mg via ORAL
  Filled 2023-03-06 (×15): qty 1

## 2023-03-06 NOTE — Progress Notes (Signed)
 Physical Therapy Treatment Patient Details Name: Susan [name] MRN: [id number] DOB: Oct 12, 1940 Today's Date: 03/06/2023   History of Present Illness Pt is 83 yo female admitted on 02/27/23 with weakness and falls.  Pt with presumptive UTI.  Pt with hx including but not limited to for chronic HFpEF, paroxysmal A-fib on Eliquis, hyperlipidemia, CKD 3B, anemia of chronic disease, fracture of the distal shaft of the fifth metatarsal from a fall in January 2025, and R heel wound.    PT Comments  PT - Cognition Comments: AxO x 2 pleasant but very fearful. Assisted OOB required increased time and + 2 assist.  General bed mobility comments: Requires Max Assist to transfer from supine to EOB using bed pad to complete.  Once upright, pt still present with a posterior lean. General transfer comment: Pt required + 2 Max Assist to transfer from elevated bed to [facility] 1/4 pivot with inability to self pivot and B feet "skiing".  Then assisted off BSC + 2 Max Assist side by side to perform peri care then switched BSC with recliner from behind as pt stood. Positioned in recliner to comfort.   LPT rec SNF however per TOC "Pt has used all her SNF days".  Now plan is to return home with family support.    If plan is discharge home, recommend the following: Two people to help with walking and/or transfers;A lot of help with bathing/dressing/bathroom;Assistance with cooking/housework;Assist for transportation;Help with stairs or ramp for entrance   Can travel by private vehicle     No  Equipment Recommendations  None recommended by PT    Recommendations for Other Services       Precautions / Restrictions Precautions Precautions: Fall Recall of Precautions/Restrictions: Impaired Precaution/Restrictions Comments: R Metatarsal Fx Post Op shoes "are not here" Restrictions Weight Bearing Restrictions Per Provider Order: No Other Position/Activity Restrictions: Per admission in January: Post op shoe R foot but no  weight bearing precautions     Mobility  Bed Mobility Overal bed mobility: Needs Assistance Bed Mobility: Supine to Sit     Supine to sit: Max assist     General bed mobility comments: Requires Max Assist to transfer from supine to EOB using bed pad to complete.  Once upright, pt still present with a posterior lean.    Transfers Overall transfer level: Needs assistance Equipment used: Rolling walker (2 wheels) Transfers: Sit to/from Stand Sit to Stand: Max assist, +2 physical assistance, +2 safety/equipment, From elevated surface           General transfer comment: Pt required + 2 Max Assist to transfer from elevated bed to [facility] 1/4 pivot with inability to self pivot and B feet "skiing".  Then assisted off BSC + 2 Max Assist side by side to perform peri care then switched BSC with recliner from behind as pt stood.    Ambulation/Gait                   Stairs             Wheelchair Mobility     Tilt Bed    Modified Rankin (Stroke Patients Only)       Balance                                            Communication    Cognition Arousal: Alert Behavior During Therapy: [facility] for  tasks assessed/performed   PT - Cognitive impairments: No apparent impairments                       PT - Cognition Comments: AxO x 2 pleasant but very fearful. Following commands: Intact      Cueing Cueing Techniques: Verbal cues  Exercises      General Comments        Pertinent Vitals/Pain Pain Assessment Pain Assessment: Faces Faces Pain Scale: Hurts a little bit Pain Location: Bil LE "general" Pain Descriptors / Indicators: Discomfort, Grimacing, Guarding Pain Intervention(s): Monitored during session, Repositioned    Home Living                          Prior Function            PT Goals (current goals can now be found in the care plan section) Progress towards PT goals: Progressing toward goals     Frequency    Min 1X/week      PT Plan      Co-evaluation              AM-PAC PT "6 Clicks" Mobility   Outcome Measure  Help needed turning from your back to your side while in a flat bed without using bedrails?: A Lot Help needed moving from lying on your back to sitting on the side of a flat bed without using bedrails?: A Lot Help needed moving to and from a bed to a chair (including a wheelchair)?: A Lot Help needed standing up from a chair using your arms (e.g., wheelchair or bedside chair)?: Total Help needed to walk in hospital room?: Total Help needed climbing 3-5 steps with a railing? : Total 6 Click Score: 9    End of Session Equipment Utilized During Treatment: Gait belt Activity Tolerance: Patient limited by fatigue Patient left: in chair;with call bell/phone within reach;with chair alarm set Nurse Communication: Mobility status;Need for lift equipment PT Visit Diagnosis: Unsteadiness on feet (R26.81);Repeated falls (R29.6);Muscle weakness (generalized) (M62.81);History of falling (Z91.81);Difficulty in walking, not elsewhere classified (R26.2)     Time: 1212-1237 PT Time Calculation (min) (ACUTE ONLY): 25 min  Charges:    $Therapeutic Activity: 23-37 mins PT General Charges $$ ACUTE PT VISIT: 1 Visit                     Felecia Shelling  PTA Acute  Rehabilitation Services Office M-F          [phone]

## 2023-03-06 NOTE — Progress Notes (Signed)
   03/06/23 1057  Spiritual Encounters  Type of Visit Initial  Care provided to: Patient  Reason for visit Routine spiritual support  OnCall Visit No   Brief visit with patient, patient asked for a return visit as interdisciplinary team was with patient.

## 2023-03-06 NOTE — TOC Progression Note (Addendum)
 Transition of Care [facility]) - Progression Note    Patient Details  Name: Susan [name] MRN: [id number] Date of Birth: 06/07/1940  [facility] CM/SW Contact  Howell Rucks, RN Phone Number: 03/06/2023, 12:44 PM  Clinical Narrative:  NCM, TOC Supervisor on call,  returned call to pt's dtr [name] , per [name] she spoke with a representative at [facility] and was informed that hospital attending can request a preuth with how many days he thinks the patient may need. TOC Supervisor explained will confirm if [facility] representative was referring to patients 60 Lifetime days, discussed with [name] that if that is the case that one the 60 lifetime days are used they do not reup like short term rehab/SNF days, [name] voiced understanding and confirmed family would like to proceed. NCM will follow up with [facility] to get info on how to proceed with authorization.   -3:17pm Call to [facility] to verify benefits, spoke with Kel D, confirmed pt has 60 Lifetime Reserve Days that can be used for Inpatient BH, Substance Abuse or Rehabilitation Services. Call to [facility] at [phone], spoke with Mellody Dance, initiated SNF auth, informed Kushnir patient is requesting to use her 78 Lifetime Reserve Days. REF# [id number], H&P, PT/OT notes, MD progress notes 2/24-2/27 faxed to precert at [phone], await determination.     Expected Discharge Plan: Home w Home Health Services Barriers to Discharge: Continued Medical Work up  Expected Discharge Plan and Services       Living arrangements for the past 2 months: Apartment                                       Social Determinants of Health (SDOH) Interventions SDOH Screenings   Food Insecurity: No Food Insecurity (02/27/2023)  Housing: Low Risk  (02/28/2023)  Transportation Needs: No Transportation Needs (02/27/2023)  Utilities: Not At Risk (02/27/2023)  Alcohol Screen: Low Risk  (10/21/2017)  Depression (PHQ2-9): Medium Risk (07/05/2021)  Financial Resource Strain: Low  Risk  (11/17/2017)  Physical Activity: Insufficiently Active (11/17/2017)  Social Connections: Moderately Integrated (02/27/2023)  Stress: Stress Concern Present (11/17/2017)  Tobacco Use: Medium Risk (02/28/2023)    Readmission Risk Interventions    02/28/2023   10:04 AM  Readmission Risk Prevention Plan  Transportation Screening Complete  PCP or Specialist Appt within 3-5 Days Complete  HRI or Home Care Consult Complete  Social Work Consult for Recovery Care Planning/Counseling Complete  Palliative Care Screening Not Applicable  Medication Review Oceanographer) Complete

## 2023-03-06 NOTE — Progress Notes (Signed)
   03/06/23 1943  BiPAP/CPAP/SIPAP  BiPAP/CPAP/SIPAP Pt Type Adult  Reason BIPAP/CPAP not in use Non-compliant (Pt refusing cpap for the night)

## 2023-03-06 NOTE — Progress Notes (Signed)
 [facility]  PROGRESS NOTE  Susan [name] WUJ:[id number] DOB: [birth year] DOA: 02/27/2023 PCP: Raymon Mutton., FNP   Brief HPI:    83 y.o. female with medical history significant for chronic HFpEF, paroxysmal A-fib on Eliquis, hyperlipidemia, CKD 3B, anemia of chronic disease, fracture of the distal shaft of the fifth metacarpal from a fall in January 2025, who initially presented to [facility] ED from SNF via EMS due to generalized weakness contributing to another fall 2 days ago.  Reportedly she also had urinary frequency and foul odor to her urine for the past few days.  Hospitalist called for admission.   Patient admitted as above from home with confusion weakness found to have UTI, improved drastically after treatment of UTI but continues to be quite weak.  PT recommending patient return back to SNF for ongoing physical therapy and rehab.  Patient otherwise agreeable to return back to prior facility for ongoing therapy with ultimate disposition back home with family.   Patient previously being evaluated for discharge to SNF, unfortunately patient has no skilled days left per discussion with TOC.  As such patient will be plan for discharge home later this week once home equipment can be delivered and home health can be set up.  At this time she is medically stable for discharge.      Assessment/Plan:   UTI, POA resolved -Does not meet sepsis criteria -UA positive for pyuria -Urine culture was positive for E. coli, sensitive to Rocephin -Completed antibiotic course with Rocephin   Acute metabolic encephalopathy, POA, resolving Baseline dementia, unspecified -Secondary to above, continue to follow -Improving markedly, likely consistent with UTI as primary driving force of mental status changes   Generalized weakness, possibly contributed by presumed UTI High fall risk Continue to treat underlying condition given above PT OT assessment  Fall precautions ongoing -HH  PT/OT/ST/HHA/RN/SW has been arranged with [facility], Hoyer lift has been arranged for delivery to pt's home  -Likely discharge home    CKD 3B Baseline creatinine around 1.5-1.6 currently at baseline Continue to follow IV fluids discontinued, p.o. intake much more appropriate over the past 12 hours   Hypertension, BP is not at goal, elevated Resume home oral antihypertensives Monitor vital signs   Chronic HFpEF Last 2D echo done in 2021 revealed LVEF 55 to 60% with grade 1 diastolic dysfunction Closely monitor volume status while on IV fluid hydration Start strict I's and O's and daily weight   Hyperlipidemia Resume home pravastatin.   Anemia of chronic disease Hemoglobin is at baseline No reported bleeding   Right heel pressure wound, POA Wound care specialist consulted Continue local wound care As needed analgesics and as needed bowel regimen.    Medications     amLODipine  5 mg Oral Daily   apixaban  2.5 mg Oral BID   ARIPiprazole  10 mg Oral QPM   donepezil  10 mg Oral QHS   fluticasone furoate-vilanterol  1 puff Inhalation Daily   losartan  50 mg Oral Daily   pravastatin  40 mg Oral Daily   sertraline  100 mg Oral Daily     Data Reviewed:   CBG:  No results for input(s): "GLUCAP" in the last 168 hours.  SpO2: 96 %    Vitals:   03/05/23 1415 03/05/23 2212 03/06/23 0626 03/06/23 0826  BP: (!) 157/77 (!) 144/93 (!) 165/90   Pulse: 75 71 60   Resp: 16 19 18    Temp: 98 F (36.7 C) 98.6 F (  37 C) 98.7 F (37.1 C)   TempSrc: Oral     SpO2: 99% 97% 98% 96%  Weight:      Height:          Data Reviewed:  Basic Metabolic Panel: Recent Labs  Lab 02/28/23 0644 03/01/23 0346 03/02/23 0320 03/03/23 0753 03/04/23 0616 03/05/23 1128  NA 139 141 139 137 141 137  K 3.5 3.6 3.7 3.9 4.0 4.0  CL 100 105 105 104 107 104  CO2 26 26 25 24 24 24   GLUCOSE 109* 99 102* 103* 95 151*  BUN 40* 37* 35* 37* 36* 33*  CREATININE 1.51* 1.33* 1.20* 1.33* 0.94  1.28*  CALCIUM 10.1 9.6 9.5 9.6 9.9 9.8  MG 2.1  --   --   --   --   --   PHOS 3.3  --   --   --   --   --     CBC: Recent Labs  Lab 02/27/23 1739 02/28/23 0644 03/01/23 0346 03/02/23 0320 03/03/23 0753 03/04/23 0616 03/05/23 1128  WBC 8.5   < > 4.6 4.0 3.8* 4.4 4.1  NEUTROABS 6.6  --   --   --   --   --   --   HGB 11.7*   < > 10.6* 10.4* 10.5* 10.4* 10.8*  HCT 36.2   < > 33.7* 33.6* 32.3* 32.6* 33.9*  MCV 96.0   < > 100.6* 99.7 97.3 99.1 97.7  PLT 254   < > 220 230 250 246 263   < > = values in this interval not displayed.    LFT Recent Labs  Lab 02/27/23 1739  AST 17  ALT 12  ALKPHOS 74  BILITOT 0.6  PROT 7.7  ALBUMIN 4.4     Antibiotics: Anti-infectives (From admission, onward)    Start     Dose/Rate Route Frequency Ordered Stop   02/28/23 1000  cefTRIAXone (ROCEPHIN) 1 g in sodium chloride 0.9 % 100 mL IVPB        1 g 200 mL/hr over 30 Minutes Intravenous Every 24 hours 02/28/23 0318 03/03/23 0952   02/27/23 2115  cefTRIAXone (ROCEPHIN) 1 g in sodium chloride 0.9 % 100 mL IVPB        1 g 200 mL/hr over 30 Minutes Intravenous  Once 02/27/23 2101 02/27/23 2235        DVT prophylaxis: Apixaban  Code Status: Full code  Family Communication: No family at bedside   CONSULTS    Subjective   No new complaints.   Objective    Physical Examination:   General-appears in no acute distress Heart-S1-S2, regular, no murmur auscultated Lungs-clear to auscultation bilaterally, no wheezing or crackles auscultated Abdomen-soft, nontender, no organomegaly Extremities-no edema in the lower extremities Neuro-alert, oriented x3, no focal deficit noted   Status is: Inpatient:      Pressure Injury 02/28/23 Heel Right Stage 3 -  Full thickness tissue loss. Subcutaneous fat may be visible but bone, tendon or muscle are NOT exposed. healing Stage 3 (Active)  02/28/23   Location: Heel  Location Orientation: Right  Staging: Stage 3 -  Full thickness  tissue loss. Subcutaneous fat may be visible but bone, tendon or muscle are NOT exposed.  Wound Description (Comments): healing Stage 3  Present on Admission: Yes        Susan [name] S Susan [name]   [facility] If 7PM-7AM, please contact night-coverage at [email or web address], Office  [phone]   03/06/2023, 11:47 AM  LOS: 7 days

## 2023-03-07 DIAGNOSIS — R531 Weakness: Secondary | ICD-10-CM | POA: Diagnosis not present

## 2023-03-07 DIAGNOSIS — N3000 Acute cystitis without hematuria: Secondary | ICD-10-CM | POA: Diagnosis not present

## 2023-03-07 DIAGNOSIS — L89899 Pressure ulcer of other site, unspecified stage: Secondary | ICD-10-CM | POA: Diagnosis not present

## 2023-03-07 NOTE — Plan of Care (Addendum)
 Noted with loose stool; Dr. Sharl Ma notified. Pending stool specimen. Daughter updated on plan of care.  Problem: Education: Goal: Knowledge of General Education information will improve Description: Including pain rating scale, medication(s)/side effects and non-pharmacologic comfort measures Outcome: Progressing   Problem: Health Behavior/Discharge Planning: Goal: Ability to manage health-related needs will improve Outcome: Progressing   Problem: Clinical Measurements: Goal: Ability to maintain clinical measurements within normal limits will improve Outcome: Progressing Goal: Will remain free from infection Outcome: Progressing Goal: Diagnostic test results will improve Outcome: Progressing Goal: Respiratory complications will improve Outcome: Progressing Goal: Cardiovascular complication will be avoided Outcome: Progressing   Problem: Activity: Goal: Risk for activity intolerance will decrease Outcome: Progressing   Problem: Nutrition: Goal: Adequate nutrition will be maintained Outcome: Progressing   Problem: Coping: Goal: Level of anxiety will decrease Outcome: Progressing   Problem: Elimination: Goal: Will not experience complications related to bowel motility Outcome: Progressing Goal: Will not experience complications related to urinary retention Outcome: Progressing   Problem: Pain Managment: Goal: General experience of comfort will improve and/or be controlled Outcome: Progressing   Problem: Safety: Goal: Ability to remain free from injury will improve Outcome: Progressing   Problem: Skin Integrity: Goal: Risk for impaired skin integrity will decrease Outcome: Progressing

## 2023-03-07 NOTE — TOC Progression Note (Addendum)
 Transition of Care [facility]) - Progression Note    Patient Details  Name: Susan [name] MRN: [id number] Date of Birth: Feb 07, 1940  [facility] CM/SW Contact  Howell Rucks, RN Phone Number: 03/07/2023, 9:50 AM  Clinical Narrative:   Call received from [facility] with [facility], requesting updated PT notes to  be faxed to [phone]. Learta Codding reports according to their calculation, patient has days left from 100 SNF bed days , reports SNF stay 10/30/22 to 12/13/22, 1/9 to 01/23/2023.   -12:31pm Updated PT notes from 2/27 faxed to  [facility] at [phone] as requested, await determination.     Expected Discharge Plan: Home w Home Health Services Barriers to Discharge: Continued Medical Work up  Expected Discharge Plan and Services       Living arrangements for the past 2 months: Apartment                                       Social Determinants of Health (SDOH) Interventions SDOH Screenings   Food Insecurity: No Food Insecurity (02/27/2023)  Housing: Low Risk  (02/28/2023)  Transportation Needs: No Transportation Needs (02/27/2023)  Utilities: Not At Risk (02/27/2023)  Alcohol Screen: Low Risk  (10/21/2017)  Depression (PHQ2-9): Medium Risk (07/05/2021)  Financial Resource Strain: Low Risk  (11/17/2017)  Physical Activity: Insufficiently Active (11/17/2017)  Social Connections: Moderately Integrated (02/27/2023)  Stress: Stress Concern Present (11/17/2017)  Tobacco Use: Medium Risk (02/28/2023)    Readmission Risk Interventions    02/28/2023   10:04 AM  Readmission Risk Prevention Plan  Transportation Screening Complete  PCP or Specialist Appt within 3-5 Days Complete  HRI or Home Care Consult Complete  Social Work Consult for Recovery Care Planning/Counseling Complete  Palliative Care Screening Not Applicable  Medication Review [employer]) Complete

## 2023-03-07 NOTE — Progress Notes (Signed)
 Physical Therapy Treatment Patient Details Name: Susan [name] MRN: [id number] DOB: [birth year] Today's Date: 03/07/2023   History of Present Illness Pt is 83 yo female admitted on 02/27/23 with weakness and falls.  Pt with presumptive UTI.  Pt with hx including but not limited to for chronic HFpEF, paroxysmal A-fib on Eliquis, hyperlipidemia, CKD 3B, anemia of chronic disease, fracture of the distal shaft of the fifth metatarsal from a fall in January 2025, and R heel wound.    PT Comments  Pt continues very cooperative but high risk for falling 2* tendency to lean back with move to sitting and with move to standing.  This date, pt requiring increased time for all tasks and utilized Stedy for multiple sit,.stands and finally to transfer bed to recliner.    If plan is discharge home, recommend the following: Two people to help with walking and/or transfers;A lot of help with bathing/dressing/bathroom;Assistance with cooking/housework;Assist for transportation;Help with stairs or ramp for entrance   Can travel by private vehicle     No  Equipment Recommendations  None recommended by PT    Recommendations for Other Services       Precautions / Restrictions Precautions Precautions: Fall Recall of Precautions/Restrictions: Impaired Precaution/Restrictions Comments: R Metatarsal Fx Post Op shoes "are not here" Restrictions Weight Bearing Restrictions Per Provider Order: No Other Position/Activity Restrictions: Per admission in January: Post op shoe R foot but no weight bearing precautions     Mobility  Bed Mobility Overal bed mobility: Needs Assistance Bed Mobility: Supine to Sit Rolling: Contact guard assist   Supine to sit: Mod assist     General bed mobility comments: Increased time with use of bedrails for rolling and move to sitting EOB    Transfers Overall transfer level: Needs assistance Equipment used: Rolling walker (2 wheels) Transfers: Sit to/from Stand Sit to Stand:  Mod assist, +2 safety/equipment, Via lift equipment, +2 physical assistance           General transfer comment: Pt sit<> stand x 5 from bed and recliner with use of Stedy and assist to bring wt up and fwd; Stedy used to transfer pt bed to chair safely Transfer via Lift Equipment: Stedy  Ambulation/Gait                   Stairs             Wheelchair Mobility     Tilt Bed    Modified Rankin (Stroke Patients Only)       Balance Overall balance assessment: Needs assistance Sitting-balance support: No upper extremity supported Sitting balance-Leahy Scale: Fair Sitting balance - Comments: Preferred UE support but could sit with posterior tendency   Standing balance support: Bilateral upper extremity supported Standing balance-Leahy Scale: Poor                              Communication Communication Communication: No apparent difficulties  Cognition Arousal: Alert Behavior During Therapy: WFL for tasks assessed/performed   PT - Cognitive impairments: No apparent impairments                       PT - Cognition Comments: AxO x 2 pleasant but very fearful. Following commands: Intact      Cueing Cueing Techniques: Verbal cues  Exercises      General Comments        Pertinent Vitals/Pain Pain Assessment Pain Assessment: Faces Faces Pain  Scale: Hurts a little bit Pain Location: Bil LE "general" Pain Descriptors / Indicators: Discomfort, Grimacing, Guarding Pain Intervention(s): Limited activity within patient's tolerance, Monitored during session    Home Living                          Prior Function            PT Goals (current goals can now be found in the care plan section) Acute Rehab PT Goals Patient Stated Goal: did not state PT Goal Formulation: With patient Time For Goal Achievement: 03/14/23 Potential to Achieve Goals: Fair Progress towards PT goals: Progressing toward goals    Frequency     Min 1X/week      PT Plan      Co-evaluation              AM-PAC PT "6 Clicks" Mobility   Outcome Measure  Help needed turning from your back to your side while in a flat bed without using bedrails?: A Lot Help needed moving from lying on your back to sitting on the side of a flat bed without using bedrails?: A Lot Help needed moving to and from a bed to a chair (including a wheelchair)?: A Lot Help needed standing up from a chair using your arms (e.g., wheelchair or bedside chair)?: Total Help needed to walk in hospital room?: Total Help needed climbing 3-5 steps with a railing? : Total 6 Click Score: 9    End of Session Equipment Utilized During Treatment: Gait belt Activity Tolerance: Patient limited by fatigue Patient left: in chair;with call bell/phone within reach;with chair alarm set Nurse Communication: Mobility status;Need for lift equipment PT Visit Diagnosis: Unsteadiness on feet (R26.81);Repeated falls (R29.6);Muscle weakness (generalized) (M62.81);History of falling (Z91.81);Difficulty in walking, not elsewhere classified (R26.2)     Time: 1610-9604 PT Time Calculation (min) (ACUTE ONLY): 27 min  Charges:    $Therapeutic Exercise: 23-37 mins PT General Charges $$ ACUTE PT VISIT: 1 Visit                     Mauro Kaufmann PT Acute Rehabilitation Services Pager [phone] Office [phone]    [facility] 03/07/2023, 12:55 PM

## 2023-03-07 NOTE — Progress Notes (Signed)
 [facility]  PROGRESS NOTE  Susan [name] ZOX:[id number] DOB: [birth year] DOA: 02/27/2023 PCP: Susan [name]., FNP   Brief HPI:    83 y.o. female with medical history significant for chronic HFpEF, paroxysmal A-fib on Eliquis, hyperlipidemia, CKD 3B, anemia of chronic disease, fracture of the distal shaft of the fifth metacarpal from a fall in January 2025, who initially presented to [facility] ED from SNF via EMS due to generalized weakness contributing to another fall 2 days ago.  Reportedly she also had urinary frequency and foul odor to her urine for the past few days.  Hospitalist called for admission.   Patient admitted as above from home with confusion weakness found to have UTI, improved drastically after treatment of UTI but continues to be quite weak.  PT recommending patient return back to SNF for ongoing physical therapy and rehab.  Patient otherwise agreeable to return back to prior facility for ongoing therapy with ultimate disposition back home with family.   Patient previously being evaluated for discharge to SNF, unfortunately patient has no skilled days left per discussion with TOC.  As such patient will be plan for discharge home later this week once home equipment can be delivered and home health can be set up.  At this time she is medically stable for discharge.      Assessment/Plan:   UTI, POA resolved -Does not meet sepsis criteria -UA positive for pyuria -Urine culture was positive for E. coli, sensitive to Rocephin -Completed antibiotic course with Rocephin   Acute metabolic encephalopathy, POA, resolving Baseline dementia, unspecified -Secondary to above, continue to follow -Improving markedly, likely consistent with UTI as primary driving force of mental status changes   Generalized weakness, possibly contributed by presumed UTI High fall risk Continue to treat underlying condition given above PT OT assessment  Fall precautions ongoing -HH  PT/OT/ST/HHA/RN/SW has been arranged with [facility], Hoyer lift has been arranged for delivery to pt's home  -Likely discharge home    CKD 3B Baseline creatinine around 1.5-1.6 currently at baseline Continue to follow IV fluids discontinued, p.o. intake much more appropriate over the past 12 hours   Hypertension, BP is not at goal, elevated Resume home oral antihypertensives Monitor vital signs   Chronic HFpEF Last 2D echo done in 2021 revealed LVEF 55 to 60% with grade 1 diastolic dysfunction Closely monitor volume status while on IV fluid hydration Start strict I's and O's and daily weight   Hyperlipidemia Resume home pravastatin.   Anemia of chronic disease Hemoglobin is at baseline No reported bleeding   Right heel pressure wound, POA Wound care specialist consulted Continue local wound care As needed analgesics and as needed bowel regimen.  Patient to go to skilled nursing facility.  TOC working on it.  Medications     amLODipine  5 mg Oral Daily   apixaban  2.5 mg Oral BID   ARIPiprazole  10 mg Oral QPM   donepezil  10 mg Oral QHS   fluticasone furoate-vilanterol  1 puff Inhalation Daily   hydrALAZINE  25 mg Oral TID   losartan  50 mg Oral Daily   pravastatin  40 mg Oral Daily   sertraline  100 mg Oral Daily     Data Reviewed:   CBG:  No results for input(s): "GLUCAP" in the last 168 hours.  SpO2: 100 %    Vitals:   03/07/23 0747 03/07/23 0910 03/07/23 0915 03/07/23 1335  BP: (!) 186/88  (!) 150/80 134/70  Pulse:  86 75  Resp:    18  Temp:    98 F (36.7 C)  TempSrc:      SpO2:  100%  100%  Weight:      Height:          Data Reviewed:  Basic Metabolic Panel: Recent Labs  Lab 03/01/23 0346 03/02/23 0320 03/03/23 0753 03/04/23 0616 03/05/23 1128  NA 141 139 137 141 137  K 3.6 3.7 3.9 4.0 4.0  CL 105 105 104 107 104  CO2 26 25 24 24 24   GLUCOSE 99 102* 103* 95 151*  BUN 37* 35* 37* 36* 33*  CREATININE 1.33* 1.20* 1.33* 0.94  1.28*  CALCIUM 9.6 9.5 9.6 9.9 9.8    CBC: Recent Labs  Lab 03/01/23 0346 03/02/23 0320 03/03/23 0753 03/04/23 0616 03/05/23 1128  WBC 4.6 4.0 3.8* 4.4 4.1  HGB 10.6* 10.4* 10.5* 10.4* 10.8*  HCT 33.7* 33.6* 32.3* 32.6* 33.9*  MCV 100.6* 99.7 97.3 99.1 97.7  PLT 220 230 250 246 263    LFT No results for input(s): "AST", "ALT", "ALKPHOS", "BILITOT", "PROT", "ALBUMIN" in the last 168 hours.    Antibiotics: Anti-infectives (From admission, onward)    Start     Dose/Rate Route Frequency Ordered Stop   02/28/23 1000  cefTRIAXone (ROCEPHIN) 1 g in sodium chloride 0.9 % 100 mL IVPB        1 g 200 mL/hr over 30 Minutes Intravenous Every 24 hours 02/28/23 0318 03/03/23 0952   02/27/23 2115  cefTRIAXone (ROCEPHIN) 1 g in sodium chloride 0.9 % 100 mL IVPB        1 g 200 mL/hr over 30 Minutes Intravenous  Once 02/27/23 2101 02/27/23 2235        DVT prophylaxis: Apixaban  Code Status: Full code  Family Communication: No family at bedside   CONSULTS    Subjective   Denies any complaints   Objective    Physical Examination:   General-appears in no acute distress Heart-S1-S2, regular, no murmur auscultated Lungs-clear to auscultation bilaterally, no wheezing or crackles auscultated Abdomen-soft, nontender, no organomegaly Extremities-no edema in the lower extremities Neuro-alert, oriented x3, no focal deficit noted   Status is: Inpatient:      Pressure Injury 02/28/23 Heel Right Stage 3 -  Full thickness tissue loss. Subcutaneous fat may be visible but bone, tendon or muscle are NOT exposed. healing Stage 3 (Active)  02/28/23   Location: Heel  Location Orientation: Right  Staging: Stage 3 -  Full thickness tissue loss. Subcutaneous fat may be visible but bone, tendon or muscle are NOT exposed.  Wound Description (Comments): healing Stage 3  Present on Admission: Yes        Susan [name] S Susan [name]   [facility] If 7PM-7AM, please contact  night-coverage at [email or web address], Office  [phone]   03/07/2023, 2:38 PM  LOS: 8 days

## 2023-03-07 NOTE — Progress Notes (Signed)
 Asked patient about whether she was going to wear CPAP for tonight or refuse again.  Patient stated she was going to use.  Placed patient on CPAP and showed her how to remove mask, she kept asking how to remove it, so not sure how long patient will wear.

## 2023-03-07 NOTE — Progress Notes (Signed)
 Physical Therapy Treatment Patient Details Name: Susan [name] MRN: [id number] DOB: 31-Mar-1940 Today's Date: 03/07/2023   History of Present Illness Pt is 83 yo female admitted on 02/27/23 with weakness and falls.  Pt with presumptive UTI.  Pt with hx including but not limited to for chronic HFpEF, paroxysmal A-fib on Eliquis, hyperlipidemia, CKD 3B, anemia of chronic disease, fracture of the distal shaft of the fifth metatarsal from a fall in January 2025, and R heel wound.    PT Comments  Pt continues very pleasant and expressing desire to ambulate again.  Pt stood from chair and had been incontinent of bowel.  Pt in standing with CNA assisting in completing hygiene.  With RW, pt stepped forward and performed step pvt transfer but with attempts to step bkwd unble to balance and attempting to ski legs out from under.  Pt lifted total assist to bedside sitting to achieve safe landing.  Any further attempts should be done with STEDY or lift equipment    If plan is discharge home, recommend the following: Two people to help with walking and/or transfers;A lot of help with bathing/dressing/bathroom;Assistance with cooking/housework;Assist for transportation;Help with stairs or ramp for entrance   Can travel by private vehicle     No  Equipment Recommendations  None recommended by PT    Recommendations for Other Services       Precautions / Restrictions Precautions Precautions: Fall Recall of Precautions/Restrictions: Impaired Precaution/Restrictions Comments: R Metatarsal Fx Post Op shoes "are not here" Restrictions Weight Bearing Restrictions Per Provider Order: No Other Position/Activity Restrictions: Per admission in January: Post op shoe R foot but no weight bearing precautions     Mobility  Bed Mobility Overal bed mobility: Needs Assistance Bed Mobility: Sit to Supine Rolling: Contact guard assist   Supine to sit: Mod assist Sit to supine: Mod assist, +2 for physical assistance,  +2 for safety/equipment   General bed mobility comments: cues for sequence with assist to manage LEs and to control trunk    Transfers Overall transfer level: Needs assistance Equipment used: Rolling walker (2 wheels) Transfers: Sit to/from Stand, Bed to chair/wheelchair/BSC Sit to Stand: Mod assist, +2 physical assistance   Step pivot transfers: Mod assist, +2 physical assistance, +2 safety/equipment, From elevated surface       General transfer comment: Pt sit<> stand x 5 from bed and recliner with use of Stedy and assist to bring wt up and fwd; Stedy used to transfer pt bed to chair safely Transfer via Lift Equipment: Stedy  Ambulation/Gait               General Gait Details: Steo pvt with RW but unalbe to step backward without losing balance and attempting to ski legs out.  Pt lifted to bedside total assist of two   Stairs             Wheelchair Mobility     Tilt Bed    Modified Rankin (Stroke Patients Only)       Balance Overall balance assessment: Needs assistance Sitting-balance support: No upper extremity supported Sitting balance-Leahy Scale: Fair Sitting balance - Comments: Preferred UE support but could sit with posterior tendency   Standing balance support: Bilateral upper extremity supported Standing balance-Leahy Scale: Poor                              Communication Communication Communication: No apparent difficulties  Cognition Arousal: Alert Behavior During Therapy: [facility]  for tasks assessed/performed   PT - Cognitive impairments: History of cognitive impairments                       PT - Cognition Comments: AxO x 2 pleasant but very fearful. Following commands: Intact      Cueing Cueing Techniques: Verbal cues  Exercises      General Comments        Pertinent Vitals/Pain Pain Assessment Pain Assessment: Faces Faces Pain Scale: Hurts a little bit Pain Location: Bil LE "general" Pain Descriptors /  Indicators: Discomfort, Grimacing, Guarding Pain Intervention(s): Limited activity within patient's tolerance, Monitored during session    Home Living                          Prior Function            PT Goals (current goals can now be found in the care plan section) Acute Rehab PT Goals Patient Stated Goal: I want to walk PT Goal Formulation: With patient Time For Goal Achievement: 03/14/23 Potential to Achieve Goals: Fair Progress towards PT goals: Progressing toward goals    Frequency    Min 1X/week      PT Plan      Co-evaluation              AM-PAC PT "6 Clicks" Mobility   Outcome Measure  Help needed turning from your back to your side while in a flat bed without using bedrails?: A Lot Help needed moving from lying on your back to sitting on the side of a flat bed without using bedrails?: A Lot Help needed moving to and from a bed to a chair (including a wheelchair)?: A Lot Help needed standing up from a chair using your arms (e.g., wheelchair or bedside chair)?: Total Help needed to walk in hospital room?: Total Help needed climbing 3-5 steps with a railing? : Total 6 Click Score: 9    End of Session Equipment Utilized During Treatment: Gait belt Activity Tolerance: Patient limited by fatigue Patient left: in chair;with call bell/phone within reach;with chair alarm set Nurse Communication: Mobility status;Need for lift equipment PT Visit Diagnosis: Unsteadiness on feet (R26.81);Repeated falls (R29.6);Muscle weakness (generalized) (M62.81);History of falling (Z91.81);Difficulty in walking, not elsewhere classified (R26.2)     Time: 2956-2130 PT Time Calculation (min) (ACUTE ONLY): 23 min  Charges:    $Therapeutic Activity: 23-37 mins PT General Charges $$ ACUTE PT VISIT: 1 Visit                     Mauro Kaufmann PT Acute Rehabilitation Services Pager [phone] Office [phone]    [facility] 03/07/2023, 5:24  PM

## 2023-03-08 DIAGNOSIS — R197 Diarrhea, unspecified: Secondary | ICD-10-CM

## 2023-03-08 DIAGNOSIS — N3000 Acute cystitis without hematuria: Secondary | ICD-10-CM | POA: Diagnosis not present

## 2023-03-08 LAB — CBC
HCT: 34.3 % — ABNORMAL LOW (ref 36.0–46.0)
Hemoglobin: 10.9 g/dL — ABNORMAL LOW (ref 12.0–15.0)
MCH: 31.1 pg (ref 26.0–34.0)
MCHC: 31.8 g/dL (ref 30.0–36.0)
MCV: 98 fL (ref 80.0–100.0)
Platelets: 327 10*3/uL (ref 150–400)
RBC: 3.5 MIL/uL — ABNORMAL LOW (ref 3.87–5.11)
RDW: 13 % (ref 11.5–15.5)
WBC: 4.7 10*3/uL (ref 4.0–10.5)
nRBC: 0 % (ref 0.0–0.2)

## 2023-03-08 LAB — GASTROINTESTINAL PANEL BY PCR, STOOL (REPLACES STOOL CULTURE)

## 2023-03-08 NOTE — Plan of Care (Signed)
  Problem: Education: Goal: Knowledge of General Education information will improve Description: Including pain rating scale, medication(s)/side effects and non-pharmacologic comfort measures Outcome: Progressing   Problem: Clinical Measurements: Goal: Diagnostic test results will improve Outcome: Progressing Goal: Respiratory complications will improve Outcome: Progressing Goal: Cardiovascular complication will be avoided Outcome: Progressing

## 2023-03-08 NOTE — Progress Notes (Signed)
Placed patient on CPAP for the night via auto-mode.  

## 2023-03-08 NOTE — Progress Notes (Signed)
 [facility]  PROGRESS NOTE  Susan [name] ZOX:[id number] DOB: [birth year] DOA: 02/27/2023 PCP: Susan Mutton., FNP   Brief HPI:    83 y.o. female with medical history significant for chronic HFpEF, paroxysmal A-fib on Eliquis, hyperlipidemia, CKD 3B, anemia of chronic disease, fracture of the distal shaft of the fifth metacarpal from a fall in January 2025, who initially presented to [facility] ED from SNF via EMS due to generalized weakness contributing to another fall 2 days ago.  Reportedly she also had urinary frequency and foul odor to her urine for the past few days.  Hospitalist called for admission.   Patient admitted as above from home with confusion weakness found to have UTI, improved drastically after treatment of UTI but continues to be quite weak.  PT recommending patient return back to SNF for ongoing physical therapy and rehab.  Patient otherwise agreeable to return back to prior facility for ongoing therapy with ultimate disposition back home with family.   Patient previously being evaluated for discharge to SNF, unfortunately patient has no skilled days left per discussion with TOC.  As such patient will be plan for discharge home later this week once home equipment can be delivered and home health can be set up.      Assessment/Plan:   UTI, POA resolved -Does not meet sepsis criteria -UA positive for pyuria -Urine culture was positive for E. coli, sensitive to Rocephin -Completed antibiotic course with Rocephin  Diarrhea -Started having loose stool yesterday, GI pathogen panel obtained -Discussed with ID to get C. difficile PCR; approved by Dr. Ninetta Lights -But patient just had formed stool in the afternoon today -Will continue to monitor for diarrhea   Acute metabolic encephalopathy, POA, resolving Baseline dementia, unspecified -Secondary to above, continue to follow -Improving markedly, likely consistent with UTI as primary driving force of mental status  changes   Generalized weakness, possibly contributed by presumed UTI High fall risk Continue to treat underlying condition given above PT OT assessment  Patient to go to skilled nursing facility   CKD 3B Baseline creatinine around 1.5-1.6 currently at baseline Continue to follow IV fluids discontinued, p.o. intake much more appropriate over the past 12 hours   Hypertension, BP is not at goal, elevated Resume home oral antihypertensives Monitor vital signs   Chronic HFpEF Last 2D echo done in 2021 revealed LVEF 55 to 60% with grade 1 diastolic dysfunction Stable   Hyperlipidemia Resume home pravastatin.   Anemia of chronic disease Hemoglobin is at baseline No reported bleeding   Right heel pressure wound, POA Wound care specialist consulted Continue local wound care As needed analgesics and as needed bowel regimen.  Patient to go to skilled nursing facility.  TOC working on it.  Medications     amLODipine  5 mg Oral Daily   apixaban  2.5 mg Oral BID   ARIPiprazole  10 mg Oral QPM   donepezil  10 mg Oral QHS   fluticasone furoate-vilanterol  1 puff Inhalation Daily   hydrALAZINE  25 mg Oral TID   losartan  50 mg Oral Daily   pravastatin  40 mg Oral Daily   sertraline  100 mg Oral Daily     Data Reviewed:   CBG:  No results for input(s): "GLUCAP" in the last 168 hours.  SpO2: 93 % FiO2 (%): 21 %    Vitals:   03/07/23 1811 03/07/23 2218 03/07/23 2227 03/08/23 0632  BP: (!) 159/80 (!) 146/77  (!) 141/79  Pulse: 77  73 89 74  Resp:  17 17 17   Temp:  98.6 F (37 C)  98 F (36.7 C)  TempSrc:  Oral  Oral  SpO2:  93% 90% 93%  Weight:      Height:          Data Reviewed:  Basic Metabolic Panel: Recent Labs  Lab 03/02/23 0320 03/03/23 0753 03/04/23 0616 03/05/23 1128  NA 139 137 141 137  K 3.7 3.9 4.0 4.0  CL 105 104 107 104  CO2 25 24 24 24   GLUCOSE 102* 103* 95 151*  BUN 35* 37* 36* 33*  CREATININE 1.20* 1.33* 0.94 1.28*  CALCIUM 9.5 9.6  9.9 9.8    CBC: Recent Labs  Lab 03/02/23 0320 03/03/23 0753 03/04/23 0616 03/05/23 1128  WBC 4.0 3.8* 4.4 4.1  HGB 10.4* 10.5* 10.4* 10.8*  HCT 33.6* 32.3* 32.6* 33.9*  MCV 99.7 97.3 99.1 97.7  PLT 230 250 246 263    LFT No results for input(s): "AST", "ALT", "ALKPHOS", "BILITOT", "PROT", "ALBUMIN" in the last 168 hours.    Antibiotics: Anti-infectives (From admission, onward)    Start     Dose/Rate Route Frequency Ordered Stop   02/28/23 1000  cefTRIAXone (ROCEPHIN) 1 g in sodium chloride 0.9 % 100 mL IVPB        1 g 200 mL/hr over 30 Minutes Intravenous Every 24 hours 02/28/23 0318 03/03/23 0952   02/27/23 2115  cefTRIAXone (ROCEPHIN) 1 g in sodium chloride 0.9 % 100 mL IVPB        1 g 200 mL/hr over 30 Minutes Intravenous  Once 02/27/23 2101 02/27/23 2235        DVT prophylaxis: Apixaban  Code Status: Full code  Family Communication: No family at bedside   CONSULTS    Subjective   Started having diarrhea yesterday.  Stool for GI pathogen panel ordered.  Surprisingly had formed stool this afternoon.   Objective    Physical Examination:  General-appears in no acute distress Heart-S1-S2, regular, no murmur auscultated Lungs-clear to auscultation bilaterally, no wheezing or crackles auscultated Abdomen-soft, nontender, no organomegaly Extremities-no edema in the lower extremities Neuro-alert, oriented x3, no focal deficit noted    Status is: Inpatient:      Pressure Injury 02/28/23 Heel Right Stage 3 -  Full thickness tissue loss. Subcutaneous fat may be visible but bone, tendon or muscle are NOT exposed. healing Stage 3 (Active)  02/28/23   Location: Heel  Location Orientation: Right  Staging: Stage 3 -  Full thickness tissue loss. Subcutaneous fat may be visible but bone, tendon or muscle are NOT exposed.  Wound Description (Comments): healing Stage 3  Present on Admission: Yes        Susan [name]   [facility] If 7PM-7AM,  please contact night-coverage at [email or web address], Office  [phone]   03/08/2023, 8:06 AM  LOS: 9 days

## 2023-03-09 DIAGNOSIS — R197 Diarrhea, unspecified: Secondary | ICD-10-CM | POA: Diagnosis not present

## 2023-03-09 DIAGNOSIS — N3000 Acute cystitis without hematuria: Secondary | ICD-10-CM | POA: Diagnosis not present

## 2023-03-09 LAB — C DIFFICILE (CDIFF) QUICK SCRN (NO PCR REFLEX)
C Diff antigen: POSITIVE — AB
C Diff toxin: NEGATIVE

## 2023-03-09 MED ORDER — VANCOMYCIN HCL 125 MG PO CAPS
125.0000 mg | ORAL_CAPSULE | Freq: Four times a day (QID) | ORAL | Status: DC
  Administered 2023-03-09 – 2023-03-11 (×7): 125 mg via ORAL
  Filled 2023-03-09 (×10): qty 1

## 2023-03-09 NOTE — Progress Notes (Signed)
 [facility]  PROGRESS NOTE  Susan [name] WUJ:[id number] DOB: [birth year] DOA: 02/27/2023 PCP: Raymon Mutton., FNP   Brief HPI:    83 y.o. female with medical history significant for chronic HFpEF, paroxysmal A-fib on Eliquis, hyperlipidemia, CKD 3B, anemia of chronic disease, fracture of the distal shaft of the fifth metacarpal from a fall in January 2025, who initially presented to [name] ED from SNF via EMS due to generalized weakness contributing to another fall 2 days ago.  Reportedly she also had urinary frequency and foul odor to her urine for the past few days.  Hospitalist called for admission.   Patient admitted as above from home with confusion weakness found to have UTI, improved drastically after treatment of UTI but continues to be quite weak.  PT recommending patient return back to SNF for ongoing physical therapy and rehab.  Patient otherwise agreeable to return back to prior facility for ongoing therapy with ultimate disposition back home with family.   Patient previously being evaluated for discharge to SNF, unfortunately patient has no skilled days left per discussion with TOC.  As such patient will be plan for discharge home later this week once home equipment can be delivered and home health can be set up.      Assessment/Plan:   UTI, POA resolved -Does not meet sepsis criteria -UA positive for pyuria -Urine culture was positive for E. coli, sensitive to Rocephin -Completed antibiotic course with Rocephin  Diarrhea -Resolved -GI pathogen panel was negative -Discussed with ID to get C. difficile PCR; approved by Dr. Ninetta Lights -But patient just had formed stool yesterday -Will continue to monitor for diarrhea   Acute metabolic encephalopathy, POA, resolving Baseline dementia, unspecified -Secondary to above, continue to follow -Improving markedly, likely consistent with UTI as primary driving force of mental status changes   Generalized weakness, possibly  contributed by presumed UTI High fall risk Continue to treat underlying condition given above PT OT assessment  Patient to go to skilled nursing facility   CKD 3B Baseline creatinine around 1.5-1.6 currently at baseline Continue to follow IV fluids discontinued, p.o. intake much more appropriate over the past 12 hours   Hypertension, BP is not at goal, elevated Resume home oral antihypertensives Monitor vital signs   Chronic HFpEF Last 2D echo done in 2021 revealed LVEF 55 to 60% with grade 1 diastolic dysfunction Stable   Hyperlipidemia Resume home pravastatin.   Anemia of chronic disease Hemoglobin is at baseline No reported bleeding   Right heel pressure wound, POA Wound care specialist consulted Continue local wound care As needed analgesics and as needed bowel regimen.  Patient to go to skilled nursing facility.  TOC working on it.  Medications     amLODipine  5 mg Oral Daily   apixaban  2.5 mg Oral BID   ARIPiprazole  10 mg Oral QPM   donepezil  10 mg Oral QHS   fluticasone furoate-vilanterol  1 puff Inhalation Daily   hydrALAZINE  25 mg Oral TID   losartan  50 mg Oral Daily   pravastatin  40 mg Oral Daily   sertraline  100 mg Oral Daily     Data Reviewed:   CBG:  No results for input(s): "GLUCAP" in the last 168 hours.  SpO2: 96 % FiO2 (%): 21 %    Vitals:   03/08/23 1313 03/08/23 2239 03/08/23 2253 03/09/23 0651  BP: 137/72  (!) 151/77 (!) 146/78  Pulse: 72 79 69 63  Resp: 16 19  20 17  Temp: 98.3 F (36.8 C)  (!) 97.5 F (36.4 C) 98.1 F (36.7 C)  TempSrc:   Oral Oral  SpO2: 95% 96% 93% 96%  Weight:      Height:          Data Reviewed:  Basic Metabolic Panel: Recent Labs  Lab 03/03/23 0753 03/04/23 0616 03/05/23 1128  NA 137 141 137  K 3.9 4.0 4.0  CL 104 107 104  CO2 24 24 24   GLUCOSE 103* 95 151*  BUN 37* 36* 33*  CREATININE 1.33* 0.94 1.28*  CALCIUM 9.6 9.9 9.8    CBC: Recent Labs  Lab 03/03/23 0753  03/04/23 0616 03/05/23 1128 03/08/23 1349  WBC 3.8* 4.4 4.1 4.7  HGB 10.5* 10.4* 10.8* 10.9*  HCT 32.3* 32.6* 33.9* 34.3*  MCV 97.3 99.1 97.7 98.0  PLT 250 246 263 327    LFT No results for input(s): "AST", "ALT", "ALKPHOS", "BILITOT", "PROT", "ALBUMIN" in the last 168 hours.    Antibiotics: Anti-infectives (From admission, onward)    Start     Dose/Rate Route Frequency Ordered Stop   02/28/23 1000  cefTRIAXone (ROCEPHIN) 1 g in sodium chloride 0.9 % 100 mL IVPB        1 g 200 mL/hr over 30 Minutes Intravenous Every 24 hours 02/28/23 0318 03/03/23 0952   02/27/23 2115  cefTRIAXone (ROCEPHIN) 1 g in sodium chloride 0.9 % 100 mL IVPB        1 g 200 mL/hr over 30 Minutes Intravenous  Once 02/27/23 2101 02/27/23 2235        DVT prophylaxis: Apixaban  Code Status: Full code  Family Communication: No family at bedside   CONSULTS    Subjective   Denies diarrhea.   Objective    Physical Examination:  General-appears in no acute distress Heart-S1-S2, regular, no murmur auscultated Lungs-clear to auscultation bilaterally, no wheezing or crackles auscultated Abdomen-soft, nontender, no organomegaly Extremities-no edema in the lower extremities Neuro-alert, oriented x3, no focal deficit noted    Status is: Inpatient:      Pressure Injury 02/28/23 Heel Right Stage 3 -  Full thickness tissue loss. Subcutaneous fat may be visible but bone, tendon or muscle are NOT exposed. healing Stage 3 (Active)  02/28/23   Location: Heel  Location Orientation: Right  Staging: Stage 3 -  Full thickness tissue loss. Subcutaneous fat may be visible but bone, tendon or muscle are NOT exposed.  Wound Description (Comments): healing Stage 3  Present on Admission: Yes        Aerith Canal S Loveda Colaizzi   [facility] If 7PM-7AM, please contact night-coverage at [email or web address], Office  [phone]   03/09/2023, 9:17 AM  LOS: 10 days

## 2023-03-10 DIAGNOSIS — N3000 Acute cystitis without hematuria: Secondary | ICD-10-CM | POA: Diagnosis not present

## 2023-03-10 DIAGNOSIS — R197 Diarrhea, unspecified: Secondary | ICD-10-CM | POA: Diagnosis not present

## 2023-03-10 DIAGNOSIS — L89899 Pressure ulcer of other site, unspecified stage: Secondary | ICD-10-CM | POA: Diagnosis not present

## 2023-03-10 MED ORDER — SACCHAROMYCES BOULARDII 250 MG PO CAPS
250.0000 mg | ORAL_CAPSULE | Freq: Two times a day (BID) | ORAL | Status: DC
  Administered 2023-03-10 – 2023-03-11 (×3): 250 mg via ORAL
  Filled 2023-03-10 (×2): qty 1

## 2023-03-10 NOTE — Progress Notes (Signed)
 [facility]  PROGRESS NOTE  Susan [name]:[id number] DOB: 05/05/1940 DOA: 02/27/2023 PCP: Raymon Mutton., FNP   Brief HPI:    83 y.o. female with medical history significant for chronic HFpEF, paroxysmal A-fib on Eliquis, hyperlipidemia, CKD 3B, anemia of chronic disease, fracture of the distal shaft of the fifth metacarpal from a fall in January 2025, who initially presented to [facility] ED from SNF via EMS due to generalized weakness contributing to another fall 2 days ago.  Reportedly she also had urinary frequency and foul odor to her urine for the past few days.  Hospitalist called for admission.   Patient admitted as above from home with confusion weakness found to have UTI, improved drastically after treatment of UTI but continues to be quite weak.  PT recommending patient return back to SNF for ongoing physical therapy and rehab.  Patient otherwise agreeable to return back to prior facility for ongoing therapy with ultimate disposition back home with family.   Patient previously being evaluated for discharge to SNF, unfortunately patient has no skilled days left per discussion with TOC.  As such patient will be plan for discharge home later this week once home equipment can be delivered and home health can be set up.      Assessment/Plan:   UTI, POA resolved -Does not meet sepsis criteria -UA positive for pyuria -Urine culture was positive for E. coli, sensitive to Rocephin -Completed antibiotic course with Rocephin  Diarrhea -GI pathogen panel was negative -Discussed with ID to get C. difficile PCR; approved by Dr. Ninetta Lights -C. difficile antigen was positive, toxin was negative -Patient has significant diarrhea, will start vancomycin 125 mg p.o. 4 times daily for 10 days.    Acute metabolic encephalopathy, POA, resolving Baseline dementia, unspecified -Secondary to above, continue to follow -Improving markedly, likely consistent with UTI as primary driving force of  mental status changes   Generalized weakness, possibly contributed by presumed UTI High fall risk Continue to treat underlying condition given above PT OT assessment  Patient to go to skilled nursing facility   CKD 3B Baseline creatinine around 1.5-1.6 currently at baseline Continue to follow IV fluids discontinued, p.o. intake much more appropriate over the past 12 hours   Hypertension, BP is not at goal, elevated Resume home oral antihypertensives Monitor vital signs   Chronic HFpEF Last 2D echo done in 2021 revealed LVEF 55 to 60% with grade 1 diastolic dysfunction Stable   Hyperlipidemia Resume home pravastatin.   Anemia of chronic disease Hemoglobin is at baseline No reported bleeding   Right heel pressure wound, POA Wound care specialist consulted Continue local wound care As needed analgesics and as needed bowel regimen.  Patient to go to skilled nursing facility.  TOC working on it.  Medications     amLODipine  5 mg Oral Daily   apixaban  2.5 mg Oral BID   ARIPiprazole  10 mg Oral QPM   donepezil  10 mg Oral QHS   fluticasone furoate-vilanterol  1 puff Inhalation Daily   hydrALAZINE  25 mg Oral TID   losartan  50 mg Oral Daily   pravastatin  40 mg Oral Daily   sertraline  100 mg Oral Daily   vancomycin  125 mg Oral QID     Data Reviewed:   CBG:  No results for input(s): "GLUCAP" in the last 168 hours.  SpO2: 98 % FiO2 (%): 21 %    Vitals:   03/10/23 0635 03/10/23 0800 03/10/23 0928 03/10/23  0929  BP: (!) 175/89 (!) 171/65  138/79  Pulse: (!) 52 (!) 52  75  Resp: 18 16    Temp: 98.3 F (36.8 C)   98.4 F (36.9 C)  TempSrc: Oral     SpO2: 97% 99% 98% 98%  Weight:      Height:          Data Reviewed:  Basic Metabolic Panel: Recent Labs  Lab 03/04/23 0616 03/05/23 1128  NA 141 137  K 4.0 4.0  CL 107 104  CO2 24 24  GLUCOSE 95 151*  BUN 36* 33*  CREATININE 0.94 1.28*  CALCIUM 9.9 9.8    CBC: Recent Labs  Lab  03/04/23 0616 03/05/23 1128 03/08/23 1349  WBC 4.4 4.1 4.7  HGB 10.4* 10.8* 10.9*  HCT 32.6* 33.9* 34.3*  MCV 99.1 97.7 98.0  PLT 246 263 327    LFT No results for input(s): "AST", "ALT", "ALKPHOS", "BILITOT", "PROT", "ALBUMIN" in the last 168 hours.    Antibiotics: Anti-infectives (From admission, onward)    Start     Dose/Rate Route Frequency Ordered Stop   03/09/23 1930  vancomycin (VANCOCIN) capsule 125 mg        125 mg Oral 4 times daily 03/09/23 1833 03/19/23 2000   02/28/23 1000  cefTRIAXone (ROCEPHIN) 1 g in sodium chloride 0.9 % 100 mL IVPB        1 g 200 mL/hr over 30 Minutes Intravenous Every 24 hours 02/28/23 0318 03/03/23 0952   02/27/23 2115  cefTRIAXone (ROCEPHIN) 1 g in sodium chloride 0.9 % 100 mL IVPB        1 g 200 mL/hr over 30 Minutes Intravenous  Once 02/27/23 2101 02/27/23 2235        DVT prophylaxis: Apixaban  Code Status: Full code  Family Communication: No family at bedside   CONSULTS    Subjective   Was slurring diarrhea.  C. difficile testing obtained, antigen was positive however C. difficile toxin was negative.   Objective    Physical Examination:  General-appears in no acute distress Heart-S1-S2, regular, no murmur auscultated Lungs-clear to auscultation bilaterally, no wheezing or crackles auscultated Abdomen-soft, nontender, no organomegaly Extremities-no edema in the lower extremities Neuro-alert, oriented x3, no focal deficit noted    Status is: Inpatient:      Pressure Injury 02/28/23 Heel Right Stage 3 -  Full thickness tissue loss. Subcutaneous fat may be visible but bone, tendon or muscle are NOT exposed. healing Stage 3 (Active)  02/28/23   Location: Heel  Location Orientation: Right  Staging: Stage 3 -  Full thickness tissue loss. Subcutaneous fat may be visible but bone, tendon or muscle are NOT exposed.  Wound Description (Comments): healing Stage 3  Present on Admission: Yes        Yalda Herd S  Erie Sica   [facility] If 7PM-7AM, please contact night-coverage at [email or web address], Office  [phone]   03/10/2023, 11:57 AM  LOS: 11 days

## 2023-03-10 NOTE — Progress Notes (Signed)
 Physical Therapy Treatment Patient Details Name: Susan [name] MRN: [id number] DOB: [birth year] Today's Date: 03/10/2023   History of Present Illness Pt is 83 yo female admitted on 02/27/23 with weakness and falls.  Pt with presumptive UTI.  Pt with hx including but not limited to for chronic HFpEF, paroxysmal A-fib on Eliquis, hyperlipidemia, CKD 3B, anemia of chronic disease, fracture of the distal shaft of the fifth metatarsal from a fall in January 2025, and R heel wound.    PT Comments  The patient is alert and really motivated to mobilize. Patient  continues to have poor standing and pivot abilities. Both ankles lack ROM to neutral, when standing, posterior lean and legs slide forward, (in a skiing position). Stood x 4 trails. Patient became incontinent of BM so assisted back to bed for hygiene. Patient will benefit from continued inpatient follow up therapy, <3 hours/day.      If plan is discharge home, recommend the following: Two people to help with walking and/or transfers;A lot of help with bathing/dressing/bathroom;Assistance with cooking/housework;Assist for transportation;Help with stairs or ramp for entrance   Can travel by private vehicle     No  Equipment Recommendations  None recommended by PT    Recommendations for Other Services       Precautions / Restrictions Precautions Precautions: Fall Recall of Precautions/Restrictions: Impaired Precaution/Restrictions Comments: R Metatarsal Fx Post Op shoes "are not here", heel sores Restrictions Other Position/Activity Restrictions: Per admission in January: Post op shoe R foot but no weight bearing precautions     Mobility  Bed Mobility Overal bed mobility: Needs Assistance   Rolling: Contact guard assist   Supine to sit: Mod assist     General bed mobility comments: patient slowly able to get  the feet over bed edge, requires mod support  to raise trunk, tends to lean posteriorly    Transfers Overall transfer  level: Needs assistance Equipment used: Rolling walker (2 wheels) Transfers: Sit to/from Stand Sit to Stand: Max assist, +2 physical assistance, +2 safety/equipment           General transfer comment: sit<>stand x 4, 3 times at RW, 1 time holding onto back of recliner, feet sliding forward, knees into extension- ankles plantarflexed position    Ambulation/Gait                   Stairs             Wheelchair Mobility     Tilt Bed    Modified Rankin (Stroke Patients Only)       Balance Overall balance assessment: Needs assistance Sitting-balance support: No upper extremity supported Sitting balance-Leahy Scale: Fair Sitting balance - Comments: Preferred UE support but could sit with posterior tendency   Standing balance support: Bilateral upper extremity supported, Reliant on assistive device for balance, During functional activity Standing balance-Leahy Scale: Zero Standing balance comment: strong posterior push, feet slide out forward                            Communication Communication Communication: No apparent difficulties  Cognition Arousal: Alert Behavior During Therapy: WFL for tasks assessed/performed   PT - Cognitive impairments: History of cognitive impairments, Awareness                       PT - Cognition Comments: AxO x 2 pleasant but very fearful. States  that she is sorry that she did not "do'  well stand" and wants to try again(Patient attempted x 4 with PT) Following commands: Intact      Cueing Cueing Techniques: Verbal cues  Exercises      General Comments        Pertinent Vitals/Pain Pain Assessment Faces Pain Scale: Hurts a little bit Pain Location: Bil LE "general" Pain Descriptors / Indicators: Discomfort, Grimacing, Guarding Pain Intervention(s): Monitored during session    Home Living                          Prior Function            PT Goals (current goals can now be found  in the care plan section) Progress towards PT goals: Progressing toward goals    Frequency    Min 1X/week      PT Plan      Co-evaluation              AM-PAC PT "6 Clicks" Mobility   Outcome Measure  Help needed turning from your back to your side while in a flat bed without using bedrails?: A Little Help needed moving from lying on your back to sitting on the side of a flat bed without using bedrails?: A Lot Help needed moving to and from a bed to a chair (including a wheelchair)?: Total Help needed standing up from a chair using your arms (e.g., wheelchair or bedside chair)?: Total Help needed to walk in hospital room?: Total Help needed climbing 3-5 steps with a railing? : Total 6 Click Score: 9    End of Session Equipment Utilized During Treatment: Gait belt Activity Tolerance: Patient tolerated treatment well Patient left: with call bell/phone within reach;with chair alarm set;in bed Nurse Communication: Mobility status;Need for lift equipment PT Visit Diagnosis: Unsteadiness on feet (R26.81);Repeated falls (R29.6);Muscle weakness (generalized) (M62.81);History of falling (Z91.81);Difficulty in walking, not elsewhere classified (R26.2)     Time: 1610-9604 PT Time Calculation (min) (ACUTE ONLY): 29 min  Charges:    $Therapeutic Activity: 23-37 mins PT General Charges $$ ACUTE PT VISIT: 1 Visit                     Blanchard Kelch PT Acute Rehabilitation Services Office [phone]     Rada Hay 03/10/2023, 12:58 PM

## 2023-03-10 NOTE — Plan of Care (Signed)
   Problem: Education: Goal: Knowledge of General Education information will improve Description: Including pain rating scale, medication(s)/side effects and non-pharmacologic comfort measures Outcome: Progressing   Problem: Activity: Goal: Risk for activity intolerance will decrease Outcome: Progressing   Problem: Nutrition: Goal: Adequate nutrition will be maintained Outcome: Progressing   Problem: Coping: Goal: Level of anxiety will decrease Outcome: Progressing   Problem: Elimination: Goal: Will not experience complications related to bowel motility Outcome: Progressing   Problem: Safety: Goal: Ability to remain free from injury will improve Outcome: Progressing   Problem: Skin Integrity: Goal: Risk for impaired skin integrity will decrease Outcome: Progressing

## 2023-03-10 NOTE — Progress Notes (Signed)
 Pt got transported to room 1514 via bed. Report given to [employer]. Family (daughter) Ms. Nira Retort notified about the room change per pt's request.

## 2023-03-10 NOTE — TOC Progression Note (Addendum)
 Transition of Care [facility]) - Progression Note    Patient Details  Name: Susan [name] MRN: [id number] Date of Birth: 10-17-1940  [facility] of [facility]) CM/SW Contact  Howell Rucks, RN Phone Number: 03/10/2023, 3:31 PM  Clinical Narrative:   Per Fransico Him: Plan Auth ID [id number], Milinda Pointer [id number] Days approved:  03/06/2023-03/10/2023  -3:35 Call to Alphonzo Lemmings, admissions coordinator for [facility], to inquire if pt able to transfer today, await call back.   -13:58pm Call to [facility] , confirmed SNF auth has 24 hr grace period, good through 03/11/2022 at 11:59pm, team notified.      Expected Discharge Plan: Home w Home Health Services Barriers to Discharge: Continued Medical Work up  Expected Discharge Plan and Services       Living arrangements for the past 2 months: Apartment                                       Social Determinants of Health (SDOH) Interventions SDOH Screenings   Food Insecurity: No Food Insecurity (02/27/2023)  Housing: Low Risk  (02/28/2023)  Transportation Needs: No Transportation Needs (02/27/2023)  Utilities: Not At Risk (02/27/2023)  Alcohol Screen: Low Risk  (10/21/2017)  Depression (PHQ2-9): Medium Risk (07/05/2021)  Financial Resource Strain: Low Risk  (11/17/2017)  Physical Activity: Insufficiently Active (11/17/2017)  Social Connections: Moderately Integrated (02/27/2023)  Stress: Stress Concern Present (11/17/2017)  Tobacco Use: Medium Risk (02/28/2023)    Readmission Risk Interventions    02/28/2023   10:04 AM  Readmission Risk Prevention Plan  Transportation Screening Complete  PCP or Specialist Appt within 3-5 Days Complete  HRI or Home Care Consult Complete  Social Work Consult for Recovery Care Planning/Counseling Complete  Palliative Care Screening Not Applicable  Medication Review Oceanographer) Complete

## 2023-03-10 NOTE — Plan of Care (Signed)
  Problem: Nutrition: Goal: Adequate nutrition will be maintained Outcome: Progressing   Problem: Coping: Goal: Level of anxiety will decrease Outcome: Progressing   Problem: Pain Managment: Goal: General experience of comfort will improve and/or be controlled Outcome: Progressing   Problem: Safety: Goal: Ability to remain free from injury will improve Outcome: Progressing

## 2023-03-11 DIAGNOSIS — N3000 Acute cystitis without hematuria: Secondary | ICD-10-CM | POA: Diagnosis not present

## 2023-03-11 DIAGNOSIS — L89899 Pressure ulcer of other site, unspecified stage: Secondary | ICD-10-CM | POA: Diagnosis not present

## 2023-03-11 DIAGNOSIS — R531 Weakness: Secondary | ICD-10-CM | POA: Diagnosis not present

## 2023-03-11 MED ORDER — SACCHAROMYCES BOULARDII 250 MG PO CAPS: 250.0000 mg | ORAL_CAPSULE | Freq: Two times a day (BID) | ORAL | Status: AC

## 2023-03-11 MED ORDER — AMLODIPINE BESYLATE 5 MG PO TABS: 5.0000 mg | ORAL_TABLET | Freq: Every day | ORAL | Status: AC

## 2023-03-11 MED ORDER — VANCOMYCIN HCL 125 MG PO CAPS: 125.0000 mg | ORAL_CAPSULE | Freq: Four times a day (QID) | ORAL | Status: AC

## 2023-03-11 MED ORDER — POLYETHYLENE GLYCOL 3350 17 G PO PACK: 17.0000 g | PACK | Freq: Every day | ORAL | Status: AC | PRN

## 2023-03-11 NOTE — TOC Transition Note (Signed)
 Transition of Care [facility]) - Discharge Note   Patient Details  Name: Susan [name] MRN: [id number] Date of Birth: February 21, 1940  Transition of Care [facility]) CM/SW Contact:  Howell Rucks, RN Phone Number: 03/11/2023, 12:02 PM   Clinical Narrative: [facility], [name] (dtr) notified. PTAR for transport. No further TOC needs identified.        Final next level of care: Skilled Nursing Facility Barriers to Discharge: Barriers Resolved   Patient Goals and CMS Choice Patient states their goals for this hospitalization and ongoing recovery are:: return home CMS Medicare.gov Compare Post Acute Care list provided to:: Patient Represenative (must comment) Lottie Dawson (dtr)) Choice offered to / list presented to : Adult Children [place] ownership interest in [facility].provided to:: Adult Children    Discharge Placement              Patient chooses bed at:  [facility]) Patient to be transferred to facility by: PT[facility] Name of family member notified: Rylinn Linzy (dtr) Patient and family notified of of transfer: 03/11/23  Discharge Plan and Services Additional resources added to the After Visit Summary for                                       Social Drivers of Health (SDOH) Interventions SDOH Screenings   Food Insecurity: No Food Insecurity (02/27/2023)  Housing: Low Risk  (02/28/2023)  Transportation Needs: No Transportation Needs (02/27/2023)  Utilities: Not At Risk (02/27/2023)  Alcohol Screen: Low Risk  (10/21/2017)  Depression (PHQ2-9): Medium Risk (07/05/2021)  Financial Resource Strain: Low Risk  (11/17/2017)  Physical Activity: Insufficiently Active (11/17/2017)  Social Connections: Moderately Integrated (02/27/2023)  Stress: Stress Concern Present (11/17/2017)  Tobacco Use: Medium Risk (02/28/2023)     Readmission Risk Interventions    03/11/2023   12:01 PM 02/28/2023   10:04 AM  Readmission Risk Prevention Plan  Transportation Screening  Complete Complete  PCP or Specialist Appt within 3-5 Days Complete Complete  HRI or Home Care Consult Complete Complete  Social Work Consult for Recovery Care Planning/Counseling Complete Complete  Palliative Care Screening Not Applicable Not Applicable  Medication Review Oceanographer) Complete Complete

## 2023-03-11 NOTE — TOC Progression Note (Signed)
 Transition of Care [facility]) - Progression Note    Patient Details  Name: Susan [name] MRN: [id number] Date of Birth: 06-Mar-1940  Transition [facility]) CM/SW Contact  Howell Rucks, RN Phone Number: 03/11/2023, 10:47 AM  Clinical Narrative: Call received from Charlotte Park, admissions coordinator w/[facility], reports current SNF auth expires at 11:59am. NCM called to [facility], sw Kellie, confirmed current SNF auth expires at 11:59pm today, 03/11/23 due to 24 hr grace period from las approved date of 03/10/23. NCM called to [facility], informed confirmed with [facility] Reola Calkins expires at 11:29pm, states she will confirm and call NCM back. Team notified bed available at SNF for transfer today.         Expected Discharge Plan: Home w Home Health Services Barriers to Discharge: Continued Medical Work up  Expected Discharge Plan and Services       Living arrangements for the past 2 months: Apartment                                       Social Determinants of Health (SDOH) Interventions SDOH Screenings   Food Insecurity: No Food Insecurity (02/27/2023)  Housing: Low Risk  (02/28/2023)  Transportation Needs: No Transportation Needs (02/27/2023)  Utilities: Not At Risk (02/27/2023)  Alcohol Screen: Low Risk  (10/21/2017)  Depression (PHQ2-9): Medium Risk (07/05/2021)  Financial Resource Strain: Low Risk  (11/17/2017)  Physical Activity: Insufficiently Active (11/17/2017)  Social Connections: Moderately Integrated (02/27/2023)  Stress: Stress Concern Present (11/17/2017)  Tobacco Use: Medium Risk (02/28/2023)    Readmission Risk Interventions    02/28/2023   10:04 AM  Readmission Risk Prevention Plan  Transportation Screening Complete  PCP or Specialist Appt within 3-5 Days Complete  HRI or Home Care Consult Complete  Social Work Consult for Recovery Care Planning/Counseling Complete  Palliative Care Screening Not Applicable  Medication Review Oceanographer) Complete

## 2023-03-11 NOTE — Discharge Summary (Signed)
 Physician Discharge Summary   Patient: Susan [name] MRN: [id number] DOB: 04-May-1940  Admit date:     02/27/2023  Discharge date: 03/11/23  Discharge Physician: Meredeth Ide   PCP: Raymon Mutton., FNP   Recommendations at discharge:   Patient to go to skilled nursing facility  Discharge Diagnoses: Principal Problem:   Acute cystitis without hematuria  Resolved Problems:   * No resolved hospital problems. *  Hospital Course: 83 y.o. female with medical history significant for chronic HFpEF, paroxysmal A-fib on Eliquis, hyperlipidemia, CKD 3B, anemia of chronic disease, fracture of the distal shaft of the fifth metacarpal from a fall in January 2025, who initially presented to [name] ED from SNF via EMS due to generalized weakness contributing to another fall 2 days ago.  Reportedly she also had urinary frequency and foul odor to her urine for the past few days.  Hospitalist called for admission.   Patient admitted as above from home with confusion weakness found to have UTI, improved drastically after treatment of UTI but continues to be quite weak.  PT recommending patient return back to SNF for ongoing physical therapy and rehab.  Patient otherwise agreeable to return back to prior facility for ongoing therapy with ultimate disposition back home with family.     Assessment and Plan:  UTI, POA resolved -Does not meet sepsis criteria -UA positive for pyuria -Urine culture was positive for E. coli, sensitive to Rocephin -Completed antibiotic course with Rocephin   Diarrhea -GI pathogen panel was negative -Discussed with ID to get C. difficile PCR; approved by Dr. Ninetta Lights -C. difficile antigen was positive, toxin was negative -Patient has significant diarrhea, will start vancomycin 125 mg p.o. 4 times daily for 10 days.     Acute metabolic encephalopathy, POA, resolving Baseline dementia, unspecified -Secondary to above, continue to follow -Improving markedly, likely  consistent with UTI as primary driving force of mental status changes   Generalized weakness, possibly contributed by presumed UTI High fall risk  Patient to go to skilled nursing facility   CKD 3B Baseline creatinine around 1.5-1.6 currently at baseline     Hypertension, BP is not at goal, elevated Resume home oral antihypertensives    Chronic HFpEF Last 2D echo done in 2021 revealed LVEF 55 to 60% with grade 1 diastolic dysfunction Stable -Lasix currently on hold, if started develop fluid overload can start Lasix 20 mg daily.   Hyperlipidemia Resume home pravastatin.   Anemia of chronic disease Hemoglobin is at baseline No reported bleeding   Right heel pressure wound, POA Wound care specialist consulted Continue local wound care, dressing changes every 3 days        Consultants:  Procedures performed:  Disposition: Skilled nursing facility Diet recommendation:  Discharge Diet Orders (From admission, onward)     Start     Ordered   03/11/23 0000  Diet - low sodium heart healthy        03/11/23 1125           Regular diet DISCHARGE MEDICATION: Allergies as of 03/11/2023       Reactions   Ace Inhibitors Cough   Lipitor [atorvastatin] Swelling   Simvastatin Other (See Comments)   Myalgias    Abilify [aripiprazole] Other (See Comments)   Made her words "disconnected and caused tremors"   Latex Itching        Medication List     STOP taking these medications    furosemide 40 MG tablet Commonly known as:  LASIX       TAKE these medications    albuterol 108 (90 Base) MCG/ACT inhaler Commonly known as: VENTOLIN HFA Inhale 1-2 puffs into the lungs every 6 (six) hours as needed for wheezing or shortness of breath.   amLODipine 5 MG tablet Commonly known as: NORVASC Take 1 tablet (5 mg total) by mouth daily. Start taking on: March 12, 2023 What changed:  medication strength how much to take   ARIPiprazole 10 MG tablet Commonly known as:  ABILIFY Take 10 mg by mouth every evening.   buPROPion 150 MG 24 hr tablet Commonly known as: WELLBUTRIN XL Take 150 mg by mouth daily.   busPIRone 15 MG tablet Commonly known as: BUSPAR Take 15 mg by mouth 2 (two) times daily.   Calcium 600+D3 600-10 MG-MCG Tabs Generic drug: Calcium Carb-Cholecalciferol Take 1 tablet by mouth at bedtime.   cetirizine 10 MG tablet Commonly known as: ZYRTEC Take 10 mg by mouth daily.   donepezil 10 MG tablet Commonly known as: ARICEPT Take 1 tablet (10 mg total) by mouth at bedtime. For memory loss   Eliquis 2.5 MG Tabs tablet Generic drug: apixaban TAKE 1 TABLET BY MOUTH 2 TIMES DAILY   Ensure High Protein Liqd Take 237 mLs by mouth daily.   FeroSul 325 (65 Fe) MG tablet Generic drug: ferrous sulfate Take 325 mg by mouth 2 (two) times daily.   GNP Essential One Daily Tabs TAKE 1 TABLET BY MOUTH EVERY DAY What changed: when to take this   hydrALAZINE 25 MG tablet Commonly known as: APRESOLINE TAKE 1 TABLET BY MOUTH 3 TIMES DAILY   losartan 50 MG tablet Commonly known as: COZAAR TAKE 1 TABLET BY MOUTH EVERY DAY   polyethylene glycol 17 g packet Commonly known as: MIRALAX / GLYCOLAX Take 17 g by mouth daily as needed for mild constipation.   pravastatin 40 MG tablet Commonly known as: PRAVACHOL TAKE 1 TABLET BY MOUTH EVERY DAY   saccharomyces boulardii 250 MG capsule Commonly known as: FLORASTOR Take 1 capsule (250 mg total) by mouth 2 (two) times daily.   sertraline 100 MG tablet Commonly known as: ZOLOFT Take 100 mg by mouth daily.   Symbicort 80-4.5 MCG/ACT inhaler Generic drug: budesonide-formoterol Inhale 2 puffs into the lungs in the morning and at bedtime.   TYLENOL 500 MG tablet Generic drug: acetaminophen Take 1,000 mg by mouth every 8 (eight) hours as needed for mild pain (pain score 1-3) or headache.   vancomycin 125 MG capsule Commonly known as: VANCOCIN Take 1 capsule (125 mg total) by mouth 4 (four)  times daily for 9 days.               Durable Medical Equipment  (From admission, onward)           Start     Ordered   03/03/23 1454  For home use only DME Other see comment  Once       Comments: Michiel Sites lift  Question:  Length of Need  Answer:  Lifetime   03/03/23 1453              Discharge Care Instructions  (From admission, onward)           Start     Ordered   03/11/23 0000  Discharge wound care:       Comments: Wound care  Daily      Comments: Foam dressing to right heel, change Q 3 days or PRN soiling.  03/11/23 1125            Follow-up Information     [facility], [place] Follow up.   Why: [facility] Physical Therapy/Occupational Therapy/Nursing/Speech Therapy/Home Health Aide/Social Worker Contact information: [address] [place] [address] [phone]         [facility] Follow up.   Why: Financial risk analyst information: [address] [place], [place] [address]   Phone: [phone]               Discharge Exam: Filed Weights   02/27/23 2331  Weight: 72 kg   General-appears in no acute distress Heart-S1-S2, regular, no murmur auscultated Lungs-clear to auscultation bilaterally, no wheezing or crackles auscultated Abdomen-soft, nontender, no organomegaly Extremities-no edema in the lower extremities Neuro-alert, oriented x3, no focal deficit noted  Condition at discharge: good  The results of significant diagnostics from this hospitalization (including imaging, microbiology, ancillary and laboratory) are listed below for reference.   Imaging Studies: CT Head Wo Contrast Result Date: 02/27/2023 CLINICAL DATA:  Head trauma, minor (Age >= 65y).  Fall. EXAM: CT HEAD WITHOUT CONTRAST TECHNIQUE: Contiguous axial images were obtained from the base of the skull through the vertex without intravenous contrast. RADIATION DOSE REDUCTION: This exam was  performed according to the departmental dose-optimization program which includes automated exposure control, adjustment of the mA and/or kV according to patient size and/or use of iterative reconstruction technique. COMPARISON:  01/08/2023 FINDINGS: Brain: There is atrophy and chronic small vessel disease changes. No acute intracranial abnormality. Specifically, no hemorrhage, hydrocephalus, mass lesion, acute infarction, or significant intracranial injury. Vascular: No hyperdense vessel or unexpected calcification. Skull: No acute calvarial abnormality. Sinuses/Orbits: No acute findings Other: None IMPRESSION: Atrophy, chronic microvascular disease. No acute intracranial abnormality. Electronically Signed   By: Charlett Nose M.D.   On: 02/27/2023 20:21   DG Knee Complete 4 Views Right Result Date: 02/27/2023 CLINICAL DATA:  Fall with right knee pain. EXAM: RIGHT KNEE - COMPLETE 4+ VIEW COMPARISON:  Right knee radiographs 07/29/2012 FINDINGS: Redemonstration of total right knee arthroplasty. Interval removal of the prior anterior surgical skin staples and superior knee drain seen on prior remote 07/29/2012 radiographs following surgery. Small joint effusion, decreased from prior. Mild chronic enthesopathic change at the quadriceps insertion on the patella, similar to prior. No perihardware lucency is seen to indicate hardware failure or loosening. No acute fracture or dislocation. IMPRESSION: 1. 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 2. Small joint effusion, decreased from prior. Electronically Signed   By: Neita Garnet M.D.   On: 02/27/2023 17:49   DG Foot Complete Right Result Date: 02/27/2023 CLINICAL DATA:  Fall with pain.  Pressure injury on right heel. EXAM: RIGHT ANKLE - COMPLETE 3+ VIEW; RIGHT FOOT COMPLETE - 3+ VIEW COMPARISON:  Right foot radiographs 01/10/2023 FINDINGS: There is diffuse decreased bone mineralization. Right ankle: The ankle mortise is symmetric and  intact. Tiny plantar calcaneal heel spur. Moderate calcaneocuboid, mild-to-moderate talonavicular, and mild-to-moderate navicular-cuneiform joint space narrowing, subchondral sclerosis, and dorsal osteophytosis. There is external material overlying the heel and lateral view. Within this limitation, no definite calcaneal cortical erosion is seen. Small plantar calcaneal heel spur from better seen on 01/10/2023 comparison. Right foot: Interval high-grade healing of the previously seen oblique fracture of the distal shaft of the fifth metatarsal. Mild persistent fracture line lucency. IMPRESSION: 1. Interval high-grade healing of the previously seen oblique now subacute  fracture of the distal shaft of the fifth metatarsal. No acute fracture is seen. 2. 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 3. No definite calcaneal cortical erosion is seen. Electronically Signed   By: Neita Garnet M.D.   On: 02/27/2023 17:48   DG Ankle Complete Right Result Date: 02/27/2023 CLINICAL DATA:  Fall with pain.  Pressure injury on right heel. EXAM: RIGHT ANKLE - COMPLETE 3+ VIEW; RIGHT FOOT COMPLETE - 3+ VIEW COMPARISON:  Right foot radiographs 01/10/2023 FINDINGS: There is diffuse decreased bone mineralization. Right ankle: The ankle mortise is symmetric and intact. Tiny plantar calcaneal heel spur. Moderate calcaneocuboid, mild-to-moderate talonavicular, and mild-to-moderate navicular-cuneiform joint space narrowing, subchondral sclerosis, and dorsal osteophytosis. There is external material overlying the heel and lateral view. Within this limitation, no definite calcaneal cortical erosion is seen. Small plantar calcaneal heel spur from better seen on 01/10/2023 comparison. Right foot: Interval high-grade healing of the previously seen oblique fracture of the distal shaft of the fifth metatarsal. Mild persistent fracture line lucency. IMPRESSION: 1. Interval  high-grade healing of the previously seen oblique now subacute fracture of the distal shaft of the fifth metatarsal. No acute fracture is seen. 2. 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 3. No definite calcaneal cortical erosion is seen. Electronically Signed   By: Neita Garnet M.D.   On: 02/27/2023 17:48    Microbiology: Results for orders placed or performed during the hospital encounter of 02/27/23  Urine Culture     Status: Abnormal   Collection Time: 02/27/23  8:30 PM   Specimen: Urine, Random  Result Value Ref Range Status   Specimen Description   Final    URINE, RANDOM Performed at [facility], [address] [place], [place] [address]    Special Requests   Final    NONE Reflexed from [id number] Performed at [facility], [address], [place], [place] [address]    Culture >=100,000 COLONIES/mL ESCHERICHIA COLI (A)  Final   Report Status 03/02/2023 FINAL  Final   Organism ID, Bacteria ESCHERICHIA COLI (A)  Final      Susceptibility   Escherichia coli - MIC*    AMPICILLIN >=32 RESISTANT Resistant     CEFAZOLIN 16 SENSITIVE Sensitive     CEFEPIME <=0.12 SENSITIVE Sensitive     CEFTRIAXONE <=0.25 SENSITIVE Sensitive     CIPROFLOXACIN <=0.25 SENSITIVE Sensitive     GENTAMICIN >=16 RESISTANT Resistant     IMIPENEM <=0.25 SENSITIVE Sensitive     NITROFURANTOIN <=16 SENSITIVE Sensitive     TRIMETH/SULFA >=320 RESISTANT Resistant     AMPICILLIN/SULBACTAM >=32 RESISTANT Resistant     PIP/TAZO <=4 SENSITIVE Sensitive ug/mL    * >=100,000 COLONIES/mL ESCHERICHIA COLI  Gastrointestinal Panel by PCR , Stool     Status: None   Collection Time: 03/08/23  2:47 AM   Specimen: Stool  Result Value Ref Range Status   Campylobacter species NOT DETECTED NOT DETECTED Final   Plesimonas shigelloides NOT DETECTED NOT DETECTED Final   Salmonella species NOT DETECTED NOT DETECTED  Final   Yersinia enterocolitica NOT DETECTED NOT DETECTED Final   Vibrio species NOT DETECTED NOT DETECTED Final   Vibrio cholerae NOT DETECTED NOT DETECTED Final   Enteroaggregative E coli (EAEC) NOT DETECTED NOT DETECTED Final   Enteropathogenic E coli (EPEC) NOT DETECTED NOT DETECTED Final   Enterotoxigenic E coli (ETEC) NOT DETECTED NOT DETECTED Final   Shiga like toxin producing E coli (STEC) NOT DETECTED NOT DETECTED Final  Shigella/Enteroinvasive E coli (EIEC) NOT DETECTED NOT DETECTED Final   Cryptosporidium NOT DETECTED NOT DETECTED Final   Cyclospora cayetanensis NOT DETECTED NOT DETECTED Final   Entamoeba histolytica NOT DETECTED NOT DETECTED Final   Giardia lamblia NOT DETECTED NOT DETECTED Final   Adenovirus F40/41 NOT DETECTED NOT DETECTED Final   Astrovirus NOT DETECTED NOT DETECTED Final   Norovirus GI/GII NOT DETECTED NOT DETECTED Final   Rotavirus A NOT DETECTED NOT DETECTED Final   Sapovirus (I, II, IV, and V) NOT DETECTED NOT DETECTED Final    Comment: Performed at [facility], [address] [place], [place] [address]  C Difficile Quick Screen (NO PCR Reflex)     Status: Abnormal   Collection Time: 03/08/23 10:01 AM   Specimen: STOOL  Result Value Ref Range Status   C Diff antigen POSITIVE (A) NEGATIVE Final   C Diff toxin NEGATIVE NEGATIVE Final   C Diff interpretation   Final    Results are indeterminate. Please contact the provider listed for your campus for C diff questions in AMION.    Comment: Performed at [facility], [address] [place], [place] [address]    Labs: CBC: Recent Labs  Lab 03/05/23 1128 03/08/23 1349  WBC 4.1 4.7  HGB 10.8* 10.9*  HCT 33.9* 34.3*  MCV 97.7 98.0  PLT 263 327   Basic Metabolic Panel: Recent Labs  Lab 03/05/23 1128  NA 137  K 4.0  CL 104  CO2 24  GLUCOSE 151*  BUN 33*  CREATININE 1.28*  CALCIUM 9.8   Liver Function Tests: No results for input(s): "AST", "ALT",  "ALKPHOS", "BILITOT", "PROT", "ALBUMIN" in the last 168 hours. CBG: No results for input(s): "GLUCAP" in the last 168 hours.  Discharge time spent: greater than 30 minutes.  Signed: Meredeth Ide, MD [facility] 03/11/2023

## 2023-07-31 ENCOUNTER — Encounter: Payer: Self-pay | Admitting: Neurology

## 2023-07-31 ENCOUNTER — Telehealth: Payer: Self-pay | Admitting: Neurology

## 2023-07-31 ENCOUNTER — Ambulatory Visit (INDEPENDENT_AMBULATORY_CARE_PROVIDER_SITE_OTHER): Admitting: Neurology

## 2023-07-31 VITALS — BP 156/83 | HR 61 | Ht 61.0 in | Wt 181.0 lb

## 2023-07-31 DIAGNOSIS — Z8673 Personal history of transient ischemic attack (TIA), and cerebral infarction without residual deficits: Secondary | ICD-10-CM

## 2023-07-31 DIAGNOSIS — I480 Paroxysmal atrial fibrillation: Secondary | ICD-10-CM | POA: Diagnosis not present

## 2023-07-31 DIAGNOSIS — M4802 Spinal stenosis, cervical region: Secondary | ICD-10-CM | POA: Diagnosis not present

## 2023-07-31 DIAGNOSIS — F02A18 Dementia in other diseases classified elsewhere, mild, with other behavioral disturbance: Secondary | ICD-10-CM

## 2023-07-31 DIAGNOSIS — R296 Repeated falls: Secondary | ICD-10-CM | POA: Diagnosis not present

## 2023-07-31 NOTE — Progress Notes (Signed)
 [facility] [address] [place]. [place] [address] [phone]       OFFICE CONSULT NOTE  Ms. Arlean LITTIE Eck Date of Birth:  1940-02-05 Medical Record Number:  [id number]   Referring MD: Virginia  Jackquline   Reason for Referral: Falls and difficulty walking  HPI: Ms. Rogness is a 83 year old pleasant African-American lady seen today for initial office consultation visit.  She is accompanied by a caregiver from her rehab facility and I also spoke to her daughter Randine over the phone.  History is obtained from them and review of electronic medical records and referral notes.  I personally reviewed pertinent available imaging films PACS.  She has past medical history of paroxysmal A-fib on Eliquis , chronic heart failure, hyperlipidemia, chronic kidney disease stage IIIb, anemia of chronic disease, fracture of distal shaft of fifth metacarpal.  Patient states that since October 2024 she has had frequent falls.  She was taken to rehab and initially was treated doing well with physical therapy but went home and fell again went to [facility] where also she made nonaggressive recovery.  She went home again and fell again and is currently now in rehab in January.  The patient has finished physical therapy that last week and is now getting only occupational therapy.  She was able to get up and walk with a walker with assistance from the physical therapist.  Patient states that she is weak all over and balance is poor.  She denies falling backwards or to one side.  She does have a prior history of stroke with some mild residual left-sided weakness.  She she was last admitted to the hospital on 02/27/2023 to 03/11/2023 for UTI and increased confusion and weakness and the setting.  Her baseline dementia was likely was secondary to encephalopathy.  CT head on 02/27/2023 head showed generalized atrophy and changes of chronic small vessel disease without any acute abnormalities.  MRI scan of the brain on  11/21/2022 also had shown similar findings without acute abnormality.  Patient denies any headache, slurred speech, focal extremity weakness.  She has history of mild memory loss and cognitive impairment and has been on Aricept .  As per family the cognitive issues remain unchanged and are not getting worse.  Notes from send in place skilled nursing facility mention that she has been noted to have staring blankly with slight tremor in the left hand with concerns about bradykinesia and her walking.  During today's visit I did not noticed any resting or action tremor hypophonia or drooling.  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)    Family history of anesthesia complication    some wake up during OR; some are hard to wake up; some both (07/29/2012)   GERD (gastroesophageal reflux disease)    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 (HCC)    History of CVA (cerebrovascular accident) 02/01/2015   History of stomach ulcers 1980's   Hyperlipidemia    Hyperparathyroidism, primary (HCC) 04/22/2012  Hypertensive heart disease    Incontinent of urine    wears pads   Major depressive disorder, recurrent episode, 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   OSA on CPAP    Osteoarthritis of right knee    Pedal edema    Persistent atrial fibrillation (HCC)    CHADS2VASC score is 7 - on chronic anticoagulation with Apixaban    Spinal stenosis    Umbilical hernia    unrepaired (07/29/2012)   Varicose veins    BLE (07/29/2012)   Vascular dementia  (HCC)    Venous stasis dermatitis of left lower extremity 04/29/2018    Social History:  Social History   Socioeconomic History   Marital status: Widowed    Spouse name: Not on file   Number of children: Not on file   Years of education: Not on file   Highest education level: Not on file  Occupational History   Occupation: retired    Associate Professor: RETIRED  Tobacco Use   Smoking status: Former    Current packs/day: 0.00    Average packs/day: 1 pack/day for 30.0 years (30.0 ttl pk-yrs)    Types: Cigarettes    Start date: 01/08/1948    Quit date: 01/07/1978    Years since quitting: 45.5   Smokeless tobacco: Never  Vaping Use   Vaping status: Never Used  Substance and Sexual Activity   Alcohol use: No   Drug use: No   Sexual activity: Not Currently    Comment: intercourse age 20,less than 5 secxual partners,  Other Topics Concern   Not on file  Social History Narrative   Lives at SNF    Retired    Right-handed   Caffeine: 1 cup of coffee per day         Diet:      Caffeine:      Married, if yes what year:       Do you live in a house, apartment, assisted living, condo, trailer, ect:      Pets:      Current/Past profession:      Exercise:         Living Will: Yes   DNR: No   POA/HPOA: Yes      Functional Status:   Do you have difficulty bathing or dressing yourself? Yes   Do you have difficulty preparing food or eating? No   Do you have difficulty managing your medications? Yes   Do you have difficulty managing your finances? Yes   Do you have difficulty affording your medications? Yes   Social Drivers of Corporate investment banker Strain: Low Risk  (11/17/2017)   Overall Financial Resource Strain (CARDIA)    Difficulty of Paying Living Expenses: Not hard at all  Food Insecurity: No Food Insecurity (02/27/2023)   Hunger Vital Sign    Worried About Running Out of Food in the Last Year: Never true    Ran Out of Food in the Last Year: Never true   Transportation Needs: No Transportation Needs (02/27/2023)   PRAPARE - Administrator, Civil Service (Medical): No    Lack of Transportation (Non-Medical): No  Physical Activity: Insufficiently Active (11/17/2017)   Exercise Vital Sign    Days of Exercise per Week: 3 days    Minutes of Exercise per Session: 30 min  Stress: Stress Concern Present (11/17/2017)   [employer] of Occupational Health - Occupational Stress Questionnaire    Feeling of Stress : Rather much  Social Connections: Moderately Integrated (02/27/2023)  Social Connection and Isolation Panel    Frequency of Communication with Friends and Family: More than three times a week    Frequency of Social Gatherings with Friends and Family: More than three times a week    Attends Religious Services: More than 4 times per year    Active Member of [employer] or Organizations: Yes    Attends Banker Meetings: More than 4 times per year    Marital Status: Widowed  Intimate Partner Violence: Not At Risk (02/27/2023)   Humiliation, Afraid, Rape, and Kick questionnaire    Fear of Current or Ex-Partner: No    Emotionally Abused: No    Physically Abused: No    Sexually Abused: No    Medications:   Current Outpatient Medications on File Prior to Visit  Medication Sig Dispense Refill   albuterol  (VENTOLIN  HFA) 108 (90 Base) MCG/ACT inhaler Inhale 1-2 puffs into the lungs every 6 (six) hours as needed for wheezing or shortness of breath.     amLODipine  (NORVASC ) 5 MG tablet Take 1 tablet (5 mg total) by mouth daily.     ARIPiprazole  (ABILIFY ) 10 MG tablet Take 10 mg by mouth every evening.     buPROPion  (WELLBUTRIN  XL) 150 MG 24 hr tablet Take 150 mg by mouth daily.     busPIRone  (BUSPAR ) 15 MG tablet Take 15 mg by mouth 2 (two) times daily.     Calcium  Carb-Cholecalciferol  (CALCIUM  600+D3) 600-10 MG-MCG TABS Take 1 tablet by mouth at bedtime.     cetirizine (ZYRTEC) 10 MG tablet Take 10 mg by mouth daily.      donepezil  (ARICEPT ) 10 MG tablet Take 1 tablet (10 mg total) by mouth at bedtime. For memory loss 90 tablet 1   ELIQUIS  2.5 MG TABS tablet TAKE 1 TABLET BY MOUTH 2 TIMES DAILY 60 tablet 6   FEROSUL 325 (65 Fe) MG tablet Take 325 mg by mouth 2 (two) times daily.     hydrALAZINE  (APRESOLINE ) 25 MG tablet TAKE 1 TABLET BY MOUTH 3 TIMES DAILY (Patient taking differently: Take 25 mg by mouth 3 (three) times daily.) 90 tablet 5   losartan  (COZAAR ) 50 MG tablet TAKE 1 TABLET BY MOUTH EVERY DAY (Patient taking differently: Take 50 mg by mouth daily.) 30 tablet 3   Menthol , Topical Analgesic, (BIOFREEZE ROLL-ON) 4 % GEL Apply topically.     Multiple Vitamin (GNP ESSENTIAL ONE DAILY) TABS TAKE 1 TABLET BY MOUTH EVERY DAY (Patient taking differently: Take 1 tablet by mouth daily after breakfast.) 30 tablet 11   Nutritional Supplements (ENSURE HIGH PROTEIN) LIQD Take 237 mLs by mouth daily.     polyethylene glycol (MIRALAX  / GLYCOLAX ) 17 g packet Take 17 g by mouth daily as needed for mild constipation.     pravastatin  (PRAVACHOL ) 40 MG tablet TAKE 1 TABLET BY MOUTH EVERY DAY (Patient taking differently: Take 40 mg by mouth daily.) 30 tablet 5   saccharomyces boulardii (FLORASTOR) 250 MG capsule Take 1 capsule (250 mg total) by mouth 2 (two) times daily.     sertraline  (ZOLOFT ) 100 MG tablet Take 100 mg by mouth daily.     SYMBICORT 80-4.5 MCG/ACT inhaler Inhale 2 puffs into the lungs in the morning and at bedtime.     TYLENOL  500 MG tablet Take 1,000 mg by mouth every 8 (eight) hours as needed for mild pain (pain score 1-3) or headache.     No current facility-administered medications on file prior to visit.    Allergies:  Allergies  Allergen Reactions   Ace Inhibitors Cough   Lipitor [Atorvastatin] Swelling   Simvastatin Other (See Comments)    Myalgias      Abilify  [Aripiprazole ] Other (See Comments)    Made her words disconnected and caused tremors   Latex Itching    Physical  Exam General: Pleasant obese elderly African-American lady, seated, in no evident distress Head: head normocephalic and atraumatic.   Neck: supple with no carotid or supraclavicular bruits Cardiovascular: regular rate and rhythm, no murmurs Musculoskeletal: no deformity Skin:  no rash/petichiae Vascular:  Normal pulses all extremities  Neurologic Exam Mental Status: Awake and fully alert. Oriented to place and time. Recent and remote memory diminished. Attention span, concentration and fund of knowledge diminished mood and affect appropriate.  Diminished recall 2/3.  Able to name 8 animals which can walk on forelegs.  Glabellar tap is negative.  No decreased facial expression Cranial Nerves: Fundoscopic exam reveals sharp disc margins. Pupils equal, briskly reactive to light. Extraocular movements full without nystagmus. Visual fields full to confrontation. Hearing intact. Facial sensation intact. Face, tongue, palate moves normally and symmetrically.  Motor: Normal bulk and tone. Normal strength in all tested extremity muscles.  Mild weakness of left grip and intrinsic hand muscles.  Fine finger movements are diminished on the left.  Trace weakness of left hip flexors and ankle dorsiflexors Sensory.:  Diminished touch pinprick sensation in left upper extremity.  Impaired vibration over toes bilaterally.   Coordination: Rapid alternating movements normal in all extremities. Finger-to-nose and heel-to-shin performed accurately bilaterally. Gait and Station: Deferred as patient did not bring her walker and is a fall risk Reflexes: 1+ and asymmetric and brisker on the left.. Toes downgoing.   NIHSS  2 Modified Rankin  4   ASSESSMENT: 83 year old African-American lady with remote history of stroke and recent frequent falls and worsening gait difficulties likely multifactorial due to combination of prior stroke, degenerative spine disease and mild underlying dementia.  History and exam is not  suggestive of parkinsonism     PLAN:I had a long discussion with the patient and her daughter over the phone as well as her caregiver from rehab facility and answered questions about her recurrent falls and gait difficulties which is likely multifactorial due to combination of degenerative cervical spine disease deficits from previous stroke and mild dementia.  Recommend physical therapy for gait and balance check MRI scan of the brain and cervical spine.  Continue Eliquis  for stroke prevention for A-fib maintain aggressive risk factor modification with strict control of hypertension with blood pressure goal below 130/90, lipids with LDL cholesterol goal below 70 mg percent and diabetes with hemoglobin A1c goal below 6.5%.  Check lipid profile and hemoglobin A1c today.  Follow-up in the future as needed only.    I personally spent a total of 60 minutes in the care of the patient today including getting/reviewing separately obtained history, performing a medically appropriate exam/evaluation, counseling and educating, placing orders, referring and communicating with other health care professionals, documenting clinical information in the EHR, independently interpreting results, and coordinating care.        Eather Popp, MD  Note: This document was prepared with digital dictation and possible smart phrase technology. Any transcriptional errors that result from this process are unintentional.

## 2023-07-31 NOTE — Patient Instructions (Addendum)
 I had a long discussion with the patient and her daughter over the phone as well as her caregiver from rehab facility and answered questions about her recurrent falls and gait difficulties which is likely multifactorial due to combination of degenerative cervical spine disease deficits from previous stroke and mild dementia.  Recommend physical therapy for gait and balance check MRI scan of the brain and cervical spine.  Continue Eliquis  for stroke prevention for A-fib maintain aggressive risk factor modification with strict control of hypertension with blood pressure goal below 130/90, lipids with LDL cholesterol goal below 70 mg percent and diabetes with hemoglobin A1c goal below 6.5%.  Check lipid profile and hemoglobin A1c today.  Follow-up in the future as needed only.

## 2023-07-31 NOTE — Telephone Encounter (Signed)
 no auth required sent to GI [phone]

## 2023-08-01 LAB — LIPID PANEL
Chol/HDL Ratio: 3.2 ratio (ref 0.0–4.4)
Cholesterol, Total: 225 mg/dL — ABNORMAL HIGH (ref 100–199)
HDL: 71 mg/dL (ref >39)
LDL Chol Calc (NIH): 124 mg/dL — ABNORMAL HIGH (ref 0–99)
Triglycerides: 170 mg/dL — ABNORMAL HIGH (ref 0–149)
VLDL Cholesterol Cal: 30 mg/dL (ref 5–40)

## 2023-08-01 LAB — HEMOGLOBIN A1C
Est. average glucose Bld gHb Est-mCnc: 108 mg/dL
Hgb A1c MFr Bld: 5.4 % (ref 4.8–5.6)

## 2023-08-04 ENCOUNTER — Ambulatory Visit: Payer: Self-pay | Admitting: Neurology

## 2023-08-04 ENCOUNTER — Other Ambulatory Visit: Payer: Self-pay | Admitting: Neurology

## 2023-08-04 MED ORDER — PRAVASTATIN SODIUM 40 MG PO TABS: 80.0000 mg | ORAL_TABLET | Freq: Every day | ORAL | 5 refills | Status: AC

## 2023-08-04 NOTE — Progress Notes (Signed)
 Kindly inform the patient that cholesterol profile is still not satisfactory with bad cholesterol being high.  I recommend we increase the dose of Pravachol  to 80 mg daily. The screening test for diabetes was satisfactory.

## 2023-08-04 NOTE — Progress Notes (Unsigned)
 Susan [name]

## 2023-08-07 NOTE — Telephone Encounter (Signed)
-----   Message from Eather Popp sent at 08/04/2023  4:52 PM EDT ----- Burna inform the patient that cholesterol profile is still not satisfactory with bad cholesterol being high.  I recommend we increase the dose of Pravachol  to 80 mg daily. The screening test for diabetes was satisfactory. ----- Message ----- From: Rebecka Memos Lab Results In Sent: 08/01/2023   5:36 AM EDT To: Eather GORMAN Popp, MD

## 2023-08-07 NOTE — Telephone Encounter (Signed)
 Called the number on file which goes to [facility]. Was placed on hold and they were unable to take my call. I offered to fax the lab results over to them so they can make patient and provider aware. Fax number was provided [phone]. Confirmation received.

## 2023-09-10 ENCOUNTER — Ambulatory Visit (HOSPITAL_COMMUNITY)
Admission: RE | Admit: 2023-09-10 | Discharge: 2023-09-10 | Disposition: A | Discharge status: Home / Self Care | Source: Ambulatory Visit | Attending: Neurology | Admitting: Neurology

## 2023-09-10 DIAGNOSIS — F02A18 Dementia in other diseases classified elsewhere, mild, with other behavioral disturbance: Secondary | ICD-10-CM | POA: Diagnosis present

## 2023-09-10 DIAGNOSIS — R296 Repeated falls: Secondary | ICD-10-CM | POA: Diagnosis present

## 2023-09-10 MED ORDER — GADOBUTROL 1 MMOL/ML IV SOLN
8.0000 mL | Freq: Once | INTRAVENOUS | Status: AC | PRN
  Administered 2023-09-10: 8 mL via INTRAVENOUS

## 2023-09-22 ENCOUNTER — Emergency Department (HOSPITAL_COMMUNITY)
Admission: EM | Admit: 2023-09-22 | Discharge: 2023-09-22 | Disposition: A | Discharge status: Skilled Nursing Facility | Attending: Emergency Medicine | Admitting: Emergency Medicine

## 2023-09-22 ENCOUNTER — Emergency Department (HOSPITAL_COMMUNITY)

## 2023-09-22 ENCOUNTER — Other Ambulatory Visit: Payer: Self-pay

## 2023-09-22 ENCOUNTER — Encounter (HOSPITAL_COMMUNITY): Payer: Self-pay

## 2023-09-22 DIAGNOSIS — R079 Chest pain, unspecified: Secondary | ICD-10-CM | POA: Diagnosis present

## 2023-09-22 DIAGNOSIS — R0789 Other chest pain: Secondary | ICD-10-CM | POA: Insufficient documentation

## 2023-09-22 DIAGNOSIS — Z7901 Long term (current) use of anticoagulants: Secondary | ICD-10-CM | POA: Insufficient documentation

## 2023-09-22 DIAGNOSIS — Z91040 Latex allergy status: Secondary | ICD-10-CM | POA: Insufficient documentation

## 2023-09-22 LAB — CBC WITH DIFFERENTIAL/PLATELET
Abs Immature Granulocytes: 0.01 K/uL (ref 0.00–0.07)
Basophils Absolute: 0 K/uL (ref 0.0–0.1)
Basophils Relative: 1 %
Eosinophils Absolute: 0.2 K/uL (ref 0.0–0.5)
Eosinophils Relative: 5 %
HCT: 32.8 % — ABNORMAL LOW (ref 36.0–46.0)
Hemoglobin: 10.6 g/dL — ABNORMAL LOW (ref 12.0–15.0)
Immature Granulocytes: 0 %
Lymphocytes Relative: 27 %
Lymphs Abs: 1 K/uL (ref 0.7–4.0)
MCH: 32.1 pg (ref 26.0–34.0)
MCHC: 32.3 g/dL (ref 30.0–36.0)
MCV: 99.4 fL (ref 80.0–100.0)
Monocytes Absolute: 0.6 K/uL (ref 0.1–1.0)
Monocytes Relative: 16 %
Neutro Abs: 1.9 K/uL (ref 1.7–7.7)
Neutrophils Relative %: 51 %
Platelets: 240 K/uL (ref 150–400)
RBC: 3.3 MIL/uL — ABNORMAL LOW (ref 3.87–5.11)
RDW: 14.2 % (ref 11.5–15.5)
WBC: 3.7 K/uL — ABNORMAL LOW (ref 4.0–10.5)
nRBC: 0 % (ref 0.0–0.2)

## 2023-09-22 LAB — COMPREHENSIVE METABOLIC PANEL WITH GFR
ALT: 16 U/L (ref 0–44)
AST: 23 U/L (ref 15–41)
Albumin: 3.9 g/dL (ref 3.5–5.0)
Alkaline Phosphatase: 97 U/L (ref 38–126)
Anion gap: 10 (ref 5–15)
BUN: 30 mg/dL — ABNORMAL HIGH (ref 8–23)
CO2: 22 mmol/L (ref 22–32)
Calcium: 9.9 mg/dL (ref 8.9–10.3)
Chloride: 110 mmol/L (ref 98–111)
Creatinine, Ser: 1.53 mg/dL — ABNORMAL HIGH (ref 0.44–1.00)
GFR, Estimated: 33 mL/min — ABNORMAL LOW (ref >60)
Glucose, Bld: 87 mg/dL (ref 70–99)
Potassium: 4 mmol/L (ref 3.5–5.1)
Sodium: 142 mmol/L (ref 135–145)
Total Bilirubin: 0.3 mg/dL (ref 0.0–1.2)
Total Protein: 6.5 g/dL (ref 6.5–8.1)

## 2023-09-22 LAB — LIPASE, BLOOD: Lipase: 68 U/L — ABNORMAL HIGH (ref 11–51)

## 2023-09-22 MED ORDER — ACETAMINOPHEN 500 MG PO TABS
1000.0000 mg | ORAL_TABLET | Freq: Once | ORAL | Status: AC
  Administered 2023-09-22: 1000 mg via ORAL
  Filled 2023-09-22: qty 2

## 2023-09-22 MED ORDER — DIAZEPAM 2 MG PO TABS: 2.0000 mg | ORAL_TABLET | Freq: Once | ORAL | Status: DC

## 2023-09-22 MED ORDER — KETOROLAC TROMETHAMINE 15 MG/ML IJ SOLN
15.0000 mg | Freq: Once | INTRAMUSCULAR | Status: DC
Start: 2023-09-22 — End: 2023-09-22
  Filled 2023-09-22: qty 1

## 2023-09-22 MED ORDER — OXYCODONE HCL 5 MG PO TABS
5.0000 mg | ORAL_TABLET | Freq: Once | ORAL | Status: DC
  Filled 2023-09-22: qty 1

## 2023-09-22 MED ORDER — DICLOFENAC SODIUM 1 % EX GEL: 4.0000 g | Freq: Four times a day (QID) | CUTANEOUS | 0 refills | Status: AC

## 2023-09-22 NOTE — ED Triage Notes (Addendum)
 Patient arrives via [facility] from [facility]  with shoulder pain that that has spread down her entire right side down to right hip.

## 2023-09-22 NOTE — ED Provider Notes (Signed)
 Susan [name] Provider Note   CSN: [id number] Arrival date & time: 09/22/23  1015     Patient presents with: Shoulder Pain   Susan [name] is a 83 y.o. female.   83 yo F with a chief complaints of right-sided chest pain.  This has been going on for about 3 to 4 days.  Nothing seems to make it better or worse.  Has been coughing a little bit.  No known sick contacts no recent travel.  She tells me because all the way from her right shoulder down to her groin.   Shoulder Pain      Prior to Admission medications   Medication Sig Start Date End Date Taking? Authorizing Provider  diclofenac  Sodium (VOLTAREN ) 1 % GEL Apply 4 g topically 4 (four) times daily. 09/22/23  Yes Emil Share, DO  albuterol  (VENTOLIN  HFA) 108 (90 Base) MCG/ACT inhaler Inhale 1-2 puffs into the lungs every 6 (six) hours as needed for wheezing or shortness of breath.    [provider]  amLODipine  (NORVASC ) 5 MG tablet Take 1 tablet (5 mg total) by mouth daily. 03/12/23   Drusilla Sabas RAMAN, MD  ARIPiprazole  (ABILIFY ) 10 MG tablet Take 10 mg by mouth every evening.    [provider]  buPROPion  (WELLBUTRIN  XL) 150 MG 24 hr tablet Take 150 mg by mouth daily. 10/14/22   [provider]  busPIRone  (BUSPAR ) 15 MG tablet Take 15 mg by mouth 2 (two) times daily.    [provider]  Calcium  Carb-Cholecalciferol  (CALCIUM  600+D3) 600-10 MG-MCG TABS Take 1 tablet by mouth at bedtime.    [provider]  cetirizine (ZYRTEC) 10 MG tablet Take 10 mg by mouth daily. 06/20/21   [provider]  donepezil  (ARICEPT ) 10 MG tablet Take 1 tablet (10 mg total) by mouth at bedtime. For memory loss 05/25/18   Caro Harlene POUR, NP  ELIQUIS  2.5 MG TABS tablet TAKE 1 TABLET BY MOUTH 2 TIMES DAILY 01/04/22   Lelon Hamilton T, PA-C  FEROSUL 325 (65 Fe) MG tablet Take 325 mg by mouth 2 (two) times daily. 06/21/21   [provider]  hydrALAZINE   (APRESOLINE ) 25 MG tablet TAKE 1 TABLET BY MOUTH 3 TIMES DAILY Patient taking differently: Take 25 mg by mouth 3 (three) times daily. 06/17/18   Caro Harlene POUR, NP  losartan  (COZAAR ) 50 MG tablet TAKE 1 TABLET BY MOUTH EVERY DAY Patient taking differently: Take 50 mg by mouth daily. 08/07/18   Caro Harlene POUR, NP  Menthol , Topical Analgesic, (BIOFREEZE ROLL-ON) 4 % GEL Apply topically.    [provider]  Multiple Vitamin (GNP ESSENTIAL ONE DAILY) TABS TAKE 1 TABLET BY MOUTH EVERY DAY Patient taking differently: Take 1 tablet by mouth daily after breakfast. 06/17/18   Eubanks, Jessica K, NP  Nutritional Supplements (ENSURE HIGH PROTEIN) LIQD Take 237 mLs by mouth daily.    [provider]  polyethylene glycol (MIRALAX  / GLYCOLAX ) 17 g packet Take 17 g by mouth daily as needed for mild constipation. 03/11/23   Drusilla Sabas RAMAN, MD  pravastatin  (PRAVACHOL ) 40 MG tablet Take 2 tablets (80 mg total) by mouth daily. 08/04/23   Rosemarie Eather RAMAN, MD  saccharomyces boulardii (FLORASTOR) 250 MG capsule Take 1 capsule (250 mg total) by mouth 2 (two) times daily. 03/11/23   Drusilla Sabas RAMAN, MD  sertraline  (ZOLOFT ) 100 MG tablet Take 100 mg by mouth daily.    [provider]  SYMBICORT 80-4.5 MCG/ACT inhaler Inhale 2 puffs into the lungs in the morning and at bedtime. 02/08/21   [provider]  TYLENOL  500 MG tablet Take 1,000 mg by mouth every 8 (eight) hours as needed for mild pain (pain score 1-3) or headache.    [provider]    Allergies: Ace inhibitors, Lipitor [atorvastatin], Simvastatin, Abilify  [aripiprazole ], and Latex    Review of Systems  Updated Vital Signs BP (!) 159/79   Pulse 68   Temp 98.2 F (36.8 C)   Resp 13   Ht 5' 1 (1.549 m)   SpO2 97%   BMI 34.20 kg/m   Physical Exam Vitals and nursing note reviewed.  Constitutional:      General: She is not in acute distress.    Appearance: She is well-developed. She is not diaphoretic.  HENT:      Head: Normocephalic and atraumatic.  Eyes:     Pupils: Pupils are equal, round, and reactive to light.  Cardiovascular:     Rate and Rhythm: Normal rate and regular rhythm.     Heart sounds: No murmur heard.    No friction rub. No gallop.  Pulmonary:     Effort: Pulmonary effort is normal.     Breath sounds: No wheezing or rales.  Abdominal:     General: There is no distension.     Palpations: Abdomen is soft.     Tenderness: There is no abdominal tenderness.  Musculoskeletal:        General: Tenderness present.     Cervical back: Normal range of motion and neck supple.     Comments: Pain along the right anterior chest wall reproduces her discomfort.  No rash no bruising.  Skin:    General: Skin is warm and dry.  Neurological:     Mental Status: She is alert and oriented to person, place, and time.  Psychiatric:        Behavior: Behavior normal.     (all labs ordered are listed, but only abnormal results are displayed) Labs Reviewed  CBC WITH DIFFERENTIAL/PLATELET - Abnormal; Notable for the following components:      Result Value   WBC 3.7 (*)    RBC 3.30 (*)    Hemoglobin 10.6 (*)    HCT 32.8 (*)    All other components within normal limits  COMPREHENSIVE METABOLIC PANEL WITH GFR - Abnormal; Notable for the following components:   BUN 30 (*)    Creatinine, Ser 1.53 (*)    GFR, Estimated 33 (*)    All other components within normal limits  LIPASE, BLOOD - Abnormal; Notable for the following components:   Lipase 68 (*)    All other components within normal limits    EKG: None  Radiology: [facility] Chest Port 1 View Result Date: 09/22/2023 CLINICAL DATA:  chest pain EXAM: PORTABLE CHEST 1 VIEW COMPARISON:  01/08/2023. FINDINGS: Bilateral lung fields are clear. Bilateral costophrenic angles are clear. Elevated right hemidiaphragm noted. Stable cardio-mediastinal silhouette. No acute osseous abnormalities. The soft tissues are within normal limits. IMPRESSION: No active  disease. Electronically Signed   By: Ree Molt M.D.   On: 09/22/2023 11:28     Procedures   Medications Ordered in the ED  oxyCODONE  (Oxy IR/ROXICODONE ) immediate release tablet 5 mg (5 mg Oral Not Given 09/22/23 1128)  diazepam  (VALIUM ) tablet 2 mg (2 mg Oral Not Given 09/22/23 1128)  ketorolac  (TORADOL ) 15 MG/ML injection 15 mg (0 mg Intramuscular Hold 09/22/23 1219)  acetaminophen  (TYLENOL ) tablet 1,000 mg (1,000 mg Oral Given 09/22/23 1101)                                    Medical Decision Making Amount and/or Complexity of Data Reviewed Labs: ordered. Radiology: ordered.  Risk OTC drugs. Prescription drug management.   83 yo F with a chief complaints of right-sided pain.  Going on for about 3 to 4 days.  Most likely musculoskeletal by history and physical.  Patient has a MOST form, this is comfort care only.  I did discuss this with her and she would like to have lab tests and imaging performed here.  Will obtain a chest x-ray blood work treat symptoms reassess.  Chest x-ray independently interpreted by me without focal extremity thorax.  No significant electrolyte abnormalities.  No acute anemia.  I discussed results with patient and family.  Will discharge home.  PCP follow-up.  12:59 PM:  I have discussed the diagnosis/risks/treatment options with the patient.  Evaluation and diagnostic testing in the emergency department does not suggest an emergent condition requiring admission or immediate intervention beyond what has been performed at this time.  They will follow up with PCP. We also discussed returning to the ED immediately if new or worsening sx occur. We discussed the sx which are most concerning (e.g., sudden worsening pain, fever, inability to tolerate by mouth) that necessitate immediate return. Medications administered to the patient during their visit and any new prescriptions provided to the patient are listed below.  Medications given during this  visit Medications  oxyCODONE  (Oxy IR/ROXICODONE ) immediate release tablet 5 mg (5 mg Oral Not Given 09/22/23 1128)  diazepam  (VALIUM ) tablet 2 mg (2 mg Oral Not Given 09/22/23 1128)  ketorolac  (TORADOL ) 15 MG/ML injection 15 mg (0 mg Intramuscular Hold 09/22/23 1219)  acetaminophen  (TYLENOL ) tablet 1,000 mg (1,000 mg Oral Given 09/22/23 1101)     The patient appears reasonably screen and/or stabilized for discharge and I doubt any other medical condition or other [facility] requiring further screening, evaluation, or treatment in the ED at this time prior to discharge.      Final diagnoses:  Right-sided chest wall pain    ED Discharge Orders          Ordered    diclofenac  Sodium (VOLTAREN ) 1 % GEL  4 times daily        09/22/23 1241               Emil Share, DO 09/22/23 1259

## 2023-09-22 NOTE — Discharge Instructions (Addendum)
 Your x-ray looks okay, your lab work does not show any obvious cause of your symptoms.  Please follow-up with your family doctor in the office.  As we discussed, I am going to treat this like a pulled muscle.  Use the gel as prescribed. Also take tylenol  1000mg (2 extra strength) four times a day.

## 2023-09-22 NOTE — ED Notes (Signed)
 Unsuccessful IV attempt x2.

## 2023-10-10 ENCOUNTER — Other Ambulatory Visit: Payer: Self-pay | Admitting: Nurse Practitioner

## 2023-10-10 DIAGNOSIS — N6021 Fibroadenosis of right breast: Secondary | ICD-10-CM

## 2023-10-23 ENCOUNTER — Encounter

## 2023-10-24 ENCOUNTER — Ambulatory Visit
Admission: RE | Admit: 2023-10-24 | Discharge: 2023-10-24 | Disposition: A | Discharge status: Home / Self Care | Source: Ambulatory Visit | Attending: Nurse Practitioner | Admitting: Nurse Practitioner

## 2023-10-24 ENCOUNTER — Ambulatory Visit
Admission: RE | Admit: 2023-10-24 | Discharge: 2023-10-24 | Disposition: A | Source: Ambulatory Visit | Attending: Nurse Practitioner | Admitting: Nurse Practitioner

## 2023-10-24 DIAGNOSIS — N6021 Fibroadenosis of right breast: Secondary | ICD-10-CM

## 2023-11-04 ENCOUNTER — Encounter (HOSPITAL_COMMUNITY): Payer: Self-pay | Admitting: Internal Medicine

## 2023-11-04 ENCOUNTER — Other Ambulatory Visit: Payer: Self-pay

## 2023-11-04 ENCOUNTER — Emergency Department (HOSPITAL_COMMUNITY)

## 2023-11-04 ENCOUNTER — Observation Stay (HOSPITAL_COMMUNITY)

## 2023-11-04 ENCOUNTER — Inpatient Hospital Stay (HOSPITAL_COMMUNITY)
Admission: EM | Admit: 2023-11-04 | Discharge: 2023-11-07 | DRG: 193 | Disposition: A | Discharge status: Skilled Nursing Facility | Attending: Internal Medicine | Admitting: Internal Medicine

## 2023-11-04 DIAGNOSIS — K5732 Diverticulitis of large intestine without perforation or abscess without bleeding: Secondary | ICD-10-CM | POA: Diagnosis present

## 2023-11-04 DIAGNOSIS — I4819 Other persistent atrial fibrillation: Secondary | ICD-10-CM | POA: Diagnosis present

## 2023-11-04 DIAGNOSIS — Z8711 Personal history of peptic ulcer disease: Secondary | ICD-10-CM

## 2023-11-04 DIAGNOSIS — Y92003 Bedroom of unspecified non-institutional (private) residence as the place of occurrence of the external cause: Secondary | ICD-10-CM

## 2023-11-04 DIAGNOSIS — Z7951 Long term (current) use of inhaled steroids: Secondary | ICD-10-CM

## 2023-11-04 DIAGNOSIS — Z801 Family history of malignant neoplasm of trachea, bronchus and lung: Secondary | ICD-10-CM

## 2023-11-04 DIAGNOSIS — N189 Chronic kidney disease, unspecified: Secondary | ICD-10-CM

## 2023-11-04 DIAGNOSIS — J189 Pneumonia, unspecified organism: Secondary | ICD-10-CM | POA: Diagnosis not present

## 2023-11-04 DIAGNOSIS — N179 Acute kidney failure, unspecified: Secondary | ICD-10-CM | POA: Diagnosis present

## 2023-11-04 DIAGNOSIS — K439 Ventral hernia without obstruction or gangrene: Secondary | ICD-10-CM | POA: Diagnosis present

## 2023-11-04 DIAGNOSIS — G4733 Obstructive sleep apnea (adult) (pediatric): Secondary | ICD-10-CM | POA: Diagnosis present

## 2023-11-04 DIAGNOSIS — Z7901 Long term (current) use of anticoagulants: Secondary | ICD-10-CM

## 2023-11-04 DIAGNOSIS — M25551 Pain in right hip: Secondary | ICD-10-CM | POA: Diagnosis present

## 2023-11-04 DIAGNOSIS — Z803 Family history of malignant neoplasm of breast: Secondary | ICD-10-CM

## 2023-11-04 DIAGNOSIS — Z789 Other specified health status: Secondary | ICD-10-CM

## 2023-11-04 DIAGNOSIS — Z6837 Body mass index (BMI) 37.0-37.9, adult: Secondary | ICD-10-CM

## 2023-11-04 DIAGNOSIS — D649 Anemia, unspecified: Secondary | ICD-10-CM | POA: Diagnosis present

## 2023-11-04 DIAGNOSIS — I480 Paroxysmal atrial fibrillation: Secondary | ICD-10-CM | POA: Diagnosis present

## 2023-11-04 DIAGNOSIS — I509 Heart failure, unspecified: Principal | ICD-10-CM | POA: Insufficient documentation

## 2023-11-04 DIAGNOSIS — E041 Nontoxic single thyroid nodule: Secondary | ICD-10-CM | POA: Diagnosis present

## 2023-11-04 DIAGNOSIS — Z96653 Presence of artificial knee joint, bilateral: Secondary | ICD-10-CM | POA: Diagnosis present

## 2023-11-04 DIAGNOSIS — F0153 Vascular dementia, unspecified severity, with mood disturbance: Secondary | ICD-10-CM | POA: Diagnosis present

## 2023-11-04 DIAGNOSIS — Z888 Allergy status to other drugs, medicaments and biological substances status: Secondary | ICD-10-CM

## 2023-11-04 DIAGNOSIS — Z823 Family history of stroke: Secondary | ICD-10-CM

## 2023-11-04 DIAGNOSIS — M25521 Pain in right elbow: Secondary | ICD-10-CM | POA: Diagnosis present

## 2023-11-04 DIAGNOSIS — I5033 Acute on chronic diastolic (congestive) heart failure: Principal | ICD-10-CM | POA: Diagnosis present

## 2023-11-04 DIAGNOSIS — Z833 Family history of diabetes mellitus: Secondary | ICD-10-CM

## 2023-11-04 DIAGNOSIS — Z8249 Family history of ischemic heart disease and other diseases of the circulatory system: Secondary | ICD-10-CM

## 2023-11-04 DIAGNOSIS — I69351 Hemiplegia and hemiparesis following cerebral infarction affecting right dominant side: Secondary | ICD-10-CM

## 2023-11-04 DIAGNOSIS — I10 Essential (primary) hypertension: Secondary | ICD-10-CM | POA: Diagnosis present

## 2023-11-04 DIAGNOSIS — Z9181 History of falling: Secondary | ICD-10-CM

## 2023-11-04 DIAGNOSIS — E66812 Obesity, class 2: Secondary | ICD-10-CM | POA: Diagnosis present

## 2023-11-04 DIAGNOSIS — E785 Hyperlipidemia, unspecified: Secondary | ICD-10-CM | POA: Diagnosis present

## 2023-11-04 DIAGNOSIS — F0154 Vascular dementia, unspecified severity, with anxiety: Secondary | ICD-10-CM | POA: Diagnosis present

## 2023-11-04 DIAGNOSIS — Z79899 Other long term (current) drug therapy: Secondary | ICD-10-CM

## 2023-11-04 DIAGNOSIS — Z91040 Latex allergy status: Secondary | ICD-10-CM

## 2023-11-04 DIAGNOSIS — W050XXA Fall from non-moving wheelchair, initial encounter: Secondary | ICD-10-CM | POA: Diagnosis present

## 2023-11-04 DIAGNOSIS — G8929 Other chronic pain: Secondary | ICD-10-CM | POA: Diagnosis present

## 2023-11-04 DIAGNOSIS — F32A Depression, unspecified: Secondary | ICD-10-CM | POA: Diagnosis present

## 2023-11-04 DIAGNOSIS — Z1152 Encounter for screening for COVID-19: Secondary | ICD-10-CM

## 2023-11-04 DIAGNOSIS — R0602 Shortness of breath: Secondary | ICD-10-CM | POA: Diagnosis present

## 2023-11-04 DIAGNOSIS — N1832 Chronic kidney disease, stage 3b: Secondary | ICD-10-CM | POA: Diagnosis present

## 2023-11-04 DIAGNOSIS — A0472 Enterocolitis due to Clostridium difficile, not specified as recurrent: Secondary | ICD-10-CM | POA: Diagnosis present

## 2023-11-04 DIAGNOSIS — Z87891 Personal history of nicotine dependence: Secondary | ICD-10-CM

## 2023-11-04 DIAGNOSIS — W19XXXA Unspecified fall, initial encounter: Secondary | ICD-10-CM | POA: Diagnosis present

## 2023-11-04 DIAGNOSIS — I130 Hypertensive heart and chronic kidney disease with heart failure and stage 1 through stage 4 chronic kidney disease, or unspecified chronic kidney disease: Secondary | ICD-10-CM | POA: Diagnosis present

## 2023-11-04 LAB — COMPREHENSIVE METABOLIC PANEL WITH GFR
ALT: 17 U/L (ref 0–44)
AST: 24 U/L (ref 15–41)
Albumin: 3.6 g/dL (ref 3.5–5.0)
Alkaline Phosphatase: 74 U/L (ref 38–126)
Anion gap: 8 (ref 5–15)
BUN: 31 mg/dL — ABNORMAL HIGH (ref 8–23)
CO2: 22 mmol/L (ref 22–32)
Calcium: 9 mg/dL (ref 8.9–10.3)
Chloride: 113 mmol/L — ABNORMAL HIGH (ref 98–111)
Creatinine, Ser: 1.79 mg/dL — ABNORMAL HIGH (ref 0.44–1.00)
GFR, Estimated: 27 mL/min — ABNORMAL LOW (ref >60)
Glucose, Bld: 91 mg/dL (ref 70–99)
Potassium: 4.1 mmol/L (ref 3.5–5.1)
Sodium: 143 mmol/L (ref 135–145)
Total Bilirubin: 0.6 mg/dL (ref 0.0–1.2)
Total Protein: 6.4 g/dL — ABNORMAL LOW (ref 6.5–8.1)

## 2023-11-04 LAB — CBC WITH DIFFERENTIAL/PLATELET
Abs Immature Granulocytes: 0.02 K/uL (ref 0.00–0.07)
Basophils Absolute: 0.1 K/uL (ref 0.0–0.1)
Basophils Relative: 1 %
Eosinophils Absolute: 0.3 K/uL (ref 0.0–0.5)
Eosinophils Relative: 6 %
HCT: 34.1 % — ABNORMAL LOW (ref 36.0–46.0)
Hemoglobin: 11 g/dL — ABNORMAL LOW (ref 12.0–15.0)
Immature Granulocytes: 1 %
Lymphocytes Relative: 23 %
Lymphs Abs: 1 K/uL (ref 0.7–4.0)
MCH: 31.6 pg (ref 26.0–34.0)
MCHC: 32.3 g/dL (ref 30.0–36.0)
MCV: 98 fL (ref 80.0–100.0)
Monocytes Absolute: 0.5 K/uL (ref 0.1–1.0)
Monocytes Relative: 12 %
Neutro Abs: 2.5 K/uL (ref 1.7–7.7)
Neutrophils Relative %: 57 %
Platelets: 273 K/uL (ref 150–400)
RBC: 3.48 MIL/uL — ABNORMAL LOW (ref 3.87–5.11)
RDW: 14.2 % (ref 11.5–15.5)
WBC: 4.3 K/uL (ref 4.0–10.5)
nRBC: 0 % (ref 0.0–0.2)

## 2023-11-04 LAB — TROPONIN I (HIGH SENSITIVITY)
Troponin I (High Sensitivity): 21 ng/L — ABNORMAL HIGH (ref <18)
Troponin I (High Sensitivity): 21 ng/L — ABNORMAL HIGH (ref <18)

## 2023-11-04 LAB — CBG MONITORING, ED: Glucose-Capillary: 71 mg/dL (ref 70–99)

## 2023-11-04 LAB — BRAIN NATRIURETIC PEPTIDE: B Natriuretic Peptide: 164.6 pg/mL — ABNORMAL HIGH (ref 0.0–100.0)

## 2023-11-04 MED ORDER — FUROSEMIDE 10 MG/ML IJ SOLN
20.0000 mg | Freq: Once | INTRAMUSCULAR | Status: AC
  Administered 2023-11-04: 20 mg via INTRAVENOUS
  Filled 2023-11-04: qty 2

## 2023-11-04 MED ORDER — PRAVASTATIN SODIUM 40 MG PO TABS
80.0000 mg | ORAL_TABLET | Freq: Every day | ORAL | Status: DC
  Administered 2023-11-05 – 2023-11-07 (×3): 80 mg via ORAL
  Filled 2023-11-04 (×3): qty 2

## 2023-11-04 MED ORDER — HYDRALAZINE HCL 25 MG PO TABS
25.0000 mg | ORAL_TABLET | Freq: Three times a day (TID) | ORAL | Status: DC
Start: 2023-11-04 — End: 2023-11-06
  Administered 2023-11-04 – 2023-11-06 (×5): 25 mg via ORAL
  Filled 2023-11-04 (×5): qty 1

## 2023-11-04 MED ORDER — DONEPEZIL HCL 10 MG PO TABS
10.0000 mg | ORAL_TABLET | Freq: Every day | ORAL | Status: DC
  Administered 2023-11-04 – 2023-11-06 (×3): 10 mg via ORAL
  Filled 2023-11-04 (×3): qty 1

## 2023-11-04 MED ORDER — SERTRALINE HCL 100 MG PO TABS
100.0000 mg | ORAL_TABLET | Freq: Every day | ORAL | Status: DC
  Administered 2023-11-05 – 2023-11-07 (×3): 100 mg via ORAL
  Filled 2023-11-04 (×3): qty 1

## 2023-11-04 MED ORDER — ARIPIPRAZOLE 5 MG PO TABS: 10.0000 mg | ORAL_TABLET | Freq: Every evening | ORAL | Status: DC

## 2023-11-04 MED ORDER — APIXABAN 2.5 MG PO TABS
2.5000 mg | ORAL_TABLET | Freq: Two times a day (BID) | ORAL | Status: DC
  Administered 2023-11-04 – 2023-11-07 (×6): 2.5 mg via ORAL
  Filled 2023-11-04 (×6): qty 1

## 2023-11-04 MED ORDER — FERROUS SULFATE 325 (65 FE) MG PO TABS
325.0000 mg | ORAL_TABLET | Freq: Two times a day (BID) | ORAL | Status: DC
  Administered 2023-11-04 – 2023-11-07 (×6): 325 mg via ORAL
  Filled 2023-11-04 (×6): qty 1

## 2023-11-04 MED ORDER — HYDRALAZINE HCL 25 MG PO TABS
25.0000 mg | ORAL_TABLET | ORAL | Status: AC
Start: 2023-11-04 — End: 2023-11-04
  Administered 2023-11-04: 25 mg via ORAL
  Filled 2023-11-04: qty 1

## 2023-11-04 MED ORDER — FLUTICASONE FUROATE-VILANTEROL 100-25 MCG/ACT IN AEPB
1.0000 | INHALATION_SPRAY | Freq: Every day | RESPIRATORY_TRACT | Status: DC
  Administered 2023-11-05 – 2023-11-07 (×3): 1 via RESPIRATORY_TRACT
  Filled 2023-11-04: qty 28

## 2023-11-04 MED ORDER — AMLODIPINE BESYLATE 5 MG PO TABS: 5.0000 mg | ORAL_TABLET | Freq: Every day | ORAL | Status: DC

## 2023-11-04 MED ORDER — BUSPIRONE HCL 10 MG PO TABS
15.0000 mg | ORAL_TABLET | Freq: Two times a day (BID) | ORAL | Status: DC
  Administered 2023-11-04 – 2023-11-07 (×6): 15 mg via ORAL
  Filled 2023-11-04 (×6): qty 1

## 2023-11-04 MED ORDER — BUPROPION HCL ER (XL) 150 MG PO TB24
150.0000 mg | ORAL_TABLET | Freq: Every day | ORAL | Status: DC
  Administered 2023-11-05 – 2023-11-07 (×3): 150 mg via ORAL
  Filled 2023-11-04 (×3): qty 1

## 2023-11-04 NOTE — ED Provider Notes (Signed)
  Physical Exam  BP 126/70   Pulse 62   Temp 97.9 F (36.6 C) (Oral)   Resp 18   Ht 5' 1 (1.549 m)   Wt 90.7 kg   SpO2 100%   BMI 37.79 kg/m   Physical Exam  Procedures  Procedures  ED Course / MDM   Clinical Course as of 11/05/23 1343  Tue Nov 04, 2023  1500 Assumed care from Dr Dasie. 83 yo F on HFpEF and AF eliquis  who fell out of her chair. CT and x-rays without traumatic injury. CXR with pulmonary edema and has been short of breath for several days. Currently on RA. Awaiting lab work at this time. Giving lasix .  [RP]  [id number] Discussed results with the patient and her daughter.  Does have worsening renal function over the past month.  Seems to be getting worse since the beginning of the year.  Also does have elevated BNP and pulmonary edema.  Blood pressure still elevated in the 180 systolic.  Performed shared decision making and they are requesting to come into the hospital for blood pressure control, diuresis, and making sure that her renal function does not worsen. [RP]  1927 Discussed with Dr. Franky for admission. [RP]    Clinical Course User Index [RP] Yolande Lamar BROCKS, MD   Medical Decision Making Amount and/or Complexity of Data Reviewed Labs: ordered. Radiology: ordered. ECG/medicine tests: ordered.  Risk Prescription drug management. Decision regarding hospitalization.      Yolande Lamar BROCKS, MD 11/05/23 1344

## 2023-11-04 NOTE — H&P (Signed)
 History and Physical    [name] FMW:[id number] DOB: Feb 14, 1940 DOA: 11/04/2023  Patient coming from: Skilled nursing facility.  Chief Complaint: Fall.  HPI: Susan [name] is a 83 y.o. female with history of hypertension, chronic HFpEF, atrial fibrillation, hyperlipidemia, chronic disease stage III was brought to the ER after patient had a fall at her living history.  Patient was trying to reach out to get her wheelchair when she slipped and fell.  Did not hit her head or lose consciousness.  Over the last 1 month patient states she has been having nonproductive cough with shortness of breath on minimal exertion.  Denies chest pain.  Patient's daughter states that patient has been weak and was largely bedbound few months ago but after moving to this rehab patient has been ambulating much better.   ED Course: In the ER CT C-spine head and x-rays of the pelvis were unremarkable.  Chest x-ray was showing possible pneumonia versus fluid overload.  Troponins were elevated but flat at 21.  BNP iron  64.  Hemoglobin 11 creatinine mildly increased from baseline is around 1.7.  Was given Lasix  20 mg IV.  I ordered a CT chest which shows features concerning for pneumonia started on empiric antibiotics.  Review of Systems: As per HPI, rest all negative.   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)    Family history of anesthesia complication    some wake up during OR; some are hard to wake up; some both (07/29/2012)   GERD (gastroesophageal reflux disease)    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 (HCC)    History of CVA (cerebrovascular accident) 02/01/2015   History of  stomach ulcers 1980's   Hyperlipidemia    Hyperparathyroidism, primary 04/22/2012   Hypertensive heart disease    Incontinent of urine    wears pads   Major depressive disorder, recurrent episode, 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   OSA on CPAP    Osteoarthritis of right knee    Pedal edema    Persistent atrial fibrillation (HCC)    CHADS2VASC score is 7 - on chronic anticoagulation with Apixaban    Spinal stenosis    Umbilical hernia    unrepaired (07/29/2012)   Varicose veins    BLE (07/29/2012)   Vascular dementia (HCC)    Venous stasis dermatitis of left lower extremity 04/29/2018    Past Surgical History:  Procedure Laterality Date   CATARACT EXTRACTION     COLONOSCOPY     ENDOVENOUS ABLATION SAPHENOUS VEIN W/ LASER Left 06/24/2019   endovenous laser ablation left greater saphenous vein by Susan Blade MD    IUD REMOVAL  1980's   PARATHYROIDECTOMY N/A 06/16/2012   Procedure: NECK EXPLORATION AND LEFT SUPERIOR PARATHYROIDECTOMY;  Surgeon: Susan CHRISTELLA Spinner, MD;  Location: [facility];  Service: General;  Laterality: N/A;   TOTAL KNEE ARTHROPLASTY Right 07/29/2012   TOTAL KNEE ARTHROPLASTY Right 07/29/2012   Procedure: TOTAL KNEE ARTHROPLASTY;  Surgeon: Susan JULIANNA Chancy, MD;  Location: [facility];  Service: Orthopedics;  Laterality: Right;   TOTAL KNEE ARTHROPLASTY Left 12/13/2019   Procedure: LEFT TOTAL KNEE ARTHROPLASTY;  Surgeon: Susan Moody  M, MD;  Location: [facility] OR;  Service: Orthopedics;  Laterality: Left;     reports that she quit smoking about 45 years ago. Her smoking use included cigarettes. She started smoking about 75 years ago. She has a 30 pack-year smoking history. She has never used smokeless tobacco. She reports that she does not drink alcohol and does not use drugs.  Allergies  Allergen Reactions   Ace Inhibitors Cough   Lipitor [Atorvastatin] Swelling   Simvastatin Other (See Comments)    Myalgias      Abilify  [Aripiprazole ] Other (See Comments)    Made her words disconnected  and caused tremors   Latex Itching    Family History  Problem Relation Age of Onset   Hypertension Mother        deceased   Stroke Mother    Breast cancer Mother    Lung cancer Father        deceased   Diabetes Daughter    Hypertension Daughter     Prior to Admission medications   Medication Sig Start Date End Date Taking? Authorizing Provider  albuterol  (VENTOLIN  HFA) 108 (90 Base) MCG/ACT inhaler Inhale 1-2 puffs into the lungs every 6 (six) hours as needed for wheezing or shortness of breath.    [provider]  amLODipine  (NORVASC ) 5 MG tablet Take 1 tablet (5 mg total) by mouth daily. 03/12/23   Susan Sabas RAMAN, MD  ARIPiprazole  (ABILIFY ) 10 MG tablet Take 10 mg by mouth every evening.    [provider]  buPROPion  (WELLBUTRIN  XL) 150 MG 24 hr tablet Take 150 mg by mouth daily. 10/14/22   [provider]  busPIRone  (BUSPAR ) 15 MG tablet Take 15 mg by mouth 2 (two) times daily.    [provider]  Calcium  Carb-Cholecalciferol  (CALCIUM  600+D3) 600-10 MG-MCG TABS Take 1 tablet by mouth at bedtime.    [provider]  cetirizine (ZYRTEC) 10 MG tablet Take 10 mg by mouth daily. 06/20/21   [provider]  diclofenac  Sodium (VOLTAREN ) 1 % GEL Apply 4 g topically 4 (four) times daily. 09/22/23   Susan [name]  donepezil  (ARICEPT ) 10 MG tablet Take 1 tablet (10 mg total) by mouth at bedtime. For memory loss 05/25/18   Susan Harlene POUR, NP  ELIQUIS  2.5 MG TABS tablet TAKE 1 TABLET BY MOUTH 2 TIMES DAILY 01/04/22   Susan Hamilton T, PA-C  FEROSUL 325 (65 Fe) MG tablet Take 325 mg by mouth 2 (two) times daily. 06/21/21   [provider]  hydrALAZINE  (APRESOLINE ) 25 MG tablet TAKE 1 TABLET BY MOUTH 3 TIMES DAILY Patient taking differently: Take 25 mg by mouth 3 (three) times daily. 06/17/18   Susan Harlene POUR, NP  losartan  (COZAAR ) 50 MG tablet TAKE 1 TABLET BY MOUTH EVERY DAY Patient taking differently: Take 50 mg by mouth daily.  08/07/18   Susan Harlene POUR, NP  Menthol , Topical Analgesic, (BIOFREEZE ROLL-ON) 4 % GEL Apply topically.    [provider]  Multiple Vitamin (GNP ESSENTIAL ONE DAILY) TABS TAKE 1 TABLET BY MOUTH EVERY DAY Patient taking differently: Take 1 tablet by mouth daily after breakfast. 06/17/18   Eubanks, Jessica K, NP  Nutritional Supplements (ENSURE HIGH PROTEIN) LIQD Take 237 mLs by mouth daily.    [provider]  polyethylene glycol (MIRALAX  / GLYCOLAX ) 17 g packet Take 17 g by mouth daily as needed for mild constipation. 03/11/23   Susan Sabas RAMAN, MD  pravastatin  (PRAVACHOL ) 40 MG tablet Take 2  tablets (80 mg total) by mouth daily. 08/04/23   Rosemarie Eather RAMAN, MD  saccharomyces boulardii (FLORASTOR) 250 MG capsule Take 1 capsule (250 mg total) by mouth 2 (two) times daily. 03/11/23   Susan Sabas RAMAN, MD  sertraline  (ZOLOFT ) 100 MG tablet Take 100 mg by mouth daily.    [provider]  SYMBICORT 80-4.5 MCG/ACT inhaler Inhale 2 puffs into the lungs in the morning and at bedtime. 02/08/21   [provider]  TYLENOL  500 MG tablet Take 1,000 mg by mouth every 8 (eight) hours as needed for mild pain (pain score 1-3) or headache.    [provider]    Physical Exam: Constitutional: Moderately built and nourished. Vitals:   11/04/23 1930 11/04/23 1945 11/04/23 2000 11/04/23 2015  BP: (!) 168/77 (!) 158/72 (!) 161/72 (!) 174/79  Pulse: 67 67 63 68  Resp: 18 20 19 19   Temp:      TempSrc:      SpO2: 93% 100% 98% 100%  Weight:      Height:       Eyes: Anicteric no pallor. ENMT: No discharge from the ears eyes nose or mouth. Neck: No mass felt.  No neck rigidity. Respiratory: No rhonchi or crepitations. Cardiovascular: S1-S2 heard. Abdomen: Soft nontender bowel sound present. Musculoskeletal: No edema. Skin: No rash. Neurologic: Alert awake oriented to time place and person.  Moves all extremities. Psychiatric: Appears normal.  Normal affect.   Labs on  Admission: I have personally reviewed following labs and imaging studies  CBC: Recent Labs  Lab 11/04/23 1512  WBC 4.3  NEUTROABS 2.5  HGB 11.0*  HCT 34.1*  MCV 98.0  PLT 273   Basic Metabolic Panel: Recent Labs  Lab 11/04/23 1512  NA 143  K 4.1  CL 113*  CO2 22  GLUCOSE 91  BUN 31*  CREATININE 1.79*  CALCIUM  9.0   GFR: Estimated Creatinine Clearance: 23.6 mL/min (A) (by C-G formula based on SCr of 1.79 mg/dL (H)). Liver Function Tests: Recent Labs  Lab 11/04/23 1512  AST 24  ALT 17  ALKPHOS 74  BILITOT 0.6  PROT 6.4*  ALBUMIN 3.6   No results for input(s): LIPASE, AMYLASE in the last 168 hours. No results for input(s): AMMONIA in the last 168 hours. Coagulation Profile: No results for input(s): INR, PROTIME in the last 168 hours. Cardiac Enzymes: No results for input(s): CKTOTAL, CKMB, CKMBINDEX, TROPONINI in the last 168 hours. BNP (last 3 results) No results for input(s): PROBNP in the last 8760 hours. HbA1C: No results for input(s): HGBA1C in the last 72 hours. CBG: Recent Labs  Lab 11/04/23 1216  GLUCAP 71   Lipid Profile: No results for input(s): CHOL, HDL, LDLCALC, TRIG, CHOLHDL, LDLDIRECT in the last 72 hours. Thyroid  Function Tests: No results for input(s): TSH, T4TOTAL, FREET4, T3FREE, THYROIDAB in the last 72 hours. Anemia Panel: No results for input(s): VITAMINB12, FOLATE, FERRITIN, TIBC, IRON , RETICCTPCT in the last 72 hours. Urine analysis:    Component Value Date/Time   COLORURINE YELLOW 02/27/2023 2030   APPEARANCEUR HAZY (A) 02/27/2023 2030   LABSPEC 1.015 02/27/2023 2030   PHURINE 5.5 02/27/2023 2030   GLUCOSEU NEGATIVE 02/27/2023 2030   HGBUR MODERATE (A) 02/27/2023 2030   BILIRUBINUR NEGATIVE 02/27/2023 2030   KETONESUR NEGATIVE 02/27/2023 2030   PROTEINUR 30 (A) 02/27/2023 2030   UROBILINOGEN 0.2 03/05/2013 1259   NITRITE NEGATIVE 02/27/2023 2030   LEUKOCYTESUR LARGE  (A) 02/27/2023 2030   Sepsis Labs: @LABRCNTIP (procalcitonin:4,lacticidven:4) )No results  found for this or any previous visit (from the past 240 hours).   Radiological Exams on Admission: CT CHEST WO CONTRAST Result Date: 11/04/2023 EXAM: CT CHEST WITHOUT CONTRAST 11/04/2023 09:31:36 PM TECHNIQUE: CT of the chest was performed without the administration of intravenous contrast. Multiplanar reformatted images are provided for review. Automated exposure control, iterative reconstruction, and/or weight based adjustment of the mA/kV was utilized to reduce the radiation dose to as low as reasonably achievable. COMPARISON: None available. CLINICAL HISTORY: Cough, chronic/persisting > 8 weeks, failed empiric treatment. Trauma, fall. FINDINGS: MEDIASTINUM: Heart is mildly enlarged. Descending thoracic aorta is mildly dilated measuring 3.3 cm. There are atherosclerotic calcifications of the aorta and coronary arteries. The central airways are clear. The thyroid  gland is diffusely heterogeneous and contains punctate calcifications. There is an inferior left thyroid  nodule measuring 13 mm. LYMPH NODES: No mediastinal, hilar or axillary lymphadenopathy. LUNGS AND PLEURA: There are atelectatic changes in the lung bases. There are some reticular opacities in the right middle lobe peripherally which are new from prior. There is a new 2 mm nodule in the right middle lobe image 4/82. There is a stable 4 mm right lower lobe nodule image 4/61. No focal consolidation or pulmonary edema. No pleural effusion or pneumothorax. SOFT TISSUES/BONES: Degenerative changes of the thoracic spine. No acute abnormality of the soft tissues. UPPER ABDOMEN: Cholecystectomy clips are present. Bilateral renal cysts are present. The largest is in the superior pole of the left kidney measuring 4.6 cm. Limited images of the upper abdomen demonstrates no acute abnormality. IMPRESSION: 1. New peripheral reticular opacities in the right middle lobe,  possibly infectious/inflammatory . 2. New 2 mm right middle lobe pulmonary nodule (image 4/82). Stable 4 mm right lower lobe pulmonary nodule (image 4/61). Per Fleischner Society Guidelines for multiple solid nodules 05 mm without high-risk features, no routine follow-up imaging is recommended. 3. Mild cardiomegaly with atherosclerotic calcifications of the aorta and coronary arteries. 4. Mild dilation of the descending thoracic aorta measuring 3.3 cm. 5. Thyroid  is diffusely heterogeneous with punctate calcifications and a 13 mm inferior left thyroid  nodule. Per ACR thyroid  incidental nodule guidelines, recommend non-emergent thyroid  ultrasound given heterogeneous/enlarged thyroid  and punctate calcifications. Electronically signed by: Greig Pique MD 11/04/2023 09:54 PM EDT RP Workstation: [id number]   DG HIP UNILAT WITH PELVIS 2-3 VIEWS RIGHT Result Date: 11/04/2023 EXAM: 2 or 3 VIEW(S) XRAY OF THE RIGHT HIP 11/04/2023 01:14:00 PM COMPARISON: None available. CLINICAL HISTORY: [id number] Trauma [id number]. Fell today,pain right hip Clemens today,pain right hip FINDINGS: BONES AND JOINTS: No acute fracture or focal osseous lesion. Mild symmetric degenerative changes in the hips with joint space narrowing and spurring. SOFT TISSUES: The soft tissues are unremarkable. IMPRESSION: 1. No acute fracture or dislocation. Electronically signed by: Franky Crease MD 11/04/2023 01:59 PM EDT RP Workstation: [id number]   DG Chest Port 1 View Result Date: 11/04/2023 CLINICAL DATA:  Fall. EXAM: PORTABLE CHEST 1 VIEW COMPARISON:  Chest radiograph dated 09/22/2023. FINDINGS: There is cardiomegaly with vascular congestion. Faint interstitial densities may represent edema. Pneumonia is not excluded. No pleural effusion or pneumothorax. No acute osseous pathology. IMPRESSION: Cardiomegaly with vascular congestion and possible mild edema. Pneumonia is not excluded. Electronically Signed   By: Vanetta Chou [name].D.   On: 11/04/2023 13:35    CT Cervical Spine Wo Contrast Result Date: 11/04/2023 EXAM: CT CERVICAL SPINE WITHOUT CONTRAST 11/04/2023 12:40:00 PM TECHNIQUE: CT of the cervical spine was performed without the administration of intravenous contrast. Multiplanar reformatted images are provided for review.  Automated exposure control, iterative reconstruction, and/or weight based adjustment of the mA/kV was utilized to reduce the radiation dose to as low as reasonably achievable. COMPARISON: CT of the cervical spine dated 10/23/2022. CLINICAL HISTORY: FINDINGS: CERVICAL SPINE: BONES AND ALIGNMENT: No acute fracture or traumatic malalignment. There is a mild levoscoliotic curvature of the cervical spine again demonstrated. There is grade 1 anterolisthesis also again noted at C2-C3. There is widening of the atlantodental interval to 5 mm, as before. 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 There is fusion of the C3 and C4 vertebral bodies. There is reversal of the normal cervical lordosis. 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 SOFT TISSUES: No prevertebral soft tissue swelling. IMPRESSION: 1. No acute abnormality of the cervical spine related to the reported trauma. 2. Widening of the atlantodental interval to 5 mm. 3. Reversal of the normal cervical lordosis with chronic degenerative disc disease at C4-5, C5-6, and C6-7 resulting in mild-to-moderate central spinal canal stenosis and bilateral neural foraminal stenosis at each level. Electronically signed by: Evalene Coho MD 11/04/2023 01:00 PM EDT RP Workstation: [id number]   CT Head Wo Contrast Result Date: 11/04/2023 EXAM: CT HEAD WITHOUT CONTRAST 11/04/2023 12:40:00 PM TECHNIQUE: CT of the head was performed without the administration of intravenous contrast. Automated exposure control,  iterative reconstruction, and/or weight based adjustment of the mA/kV was utilized to reduce the radiation dose to as low as reasonably achievable. COMPARISON: CT of the head dated 02/27/2023. CLINICAL HISTORY: Head trauma, intracranial arterial injury suspected. Trauma; Fall - LEVEL 2; Patient fell out of bed, was reaching for her wheelchair when she fell out of her bed. She fell forward and hit her concrete floor. The patient is experiencing R sided head, elbow, and hip pain. Witnessed fall, no LOC. On eliquis , hx stroke with R sided ; deficits, hz afib. AxOx4. On RA. FINDINGS: BRAIN AND VENTRICLES: No acute hemorrhage. No evidence of acute infarct. No hydrocephalus. No extra-axial collection. No mass effect or midline shift. There is age-related volume loss. There is mild-to-moderate periventricular white matter disease. There are calcifications within the carotid siphons. ORBITS: Patient is status post bilateral lens replacement. SINUSES: No acute abnormality. SOFT TISSUES AND SKULL: No acute soft tissue abnormality. No skull fracture. IMPRESSION: 1. No acute intracranial abnormality related to the reported head trauma. 2. Age-related volume loss and mild-to-moderate periventricular white matter disease. Electronically signed by: Evalene Coho MD 11/04/2023 12:53 PM EDT RP Workstation: [id number]    EKG: Independently reviewed.  Normal sinus rhythm RBBB.  Assessment/Plan Principal Problem:   Acute on chronic heart failure with preserved ejection fraction (HFpEF) (HCC) Active Problems:   OSA (obstructive sleep apnea)   Persistent atrial fibrillation   Essential hypertension   Renal failure (ARF), acute on chronic   Fall   Paroxysmal atrial fibrillation (HCC)   SOB (shortness of breath)    Possible pneumonia -   with patient having shortness of breath and cough over the last 1 month with CT scan showing infiltrates concerning for pneumonia was started on empiric antibiotics.  Check COVID and  flu test. Acute on chronic kidney disease stage III creatinine mildly increased from baseline.  Will hold ARB.  Patient did receive Lasix  in the ER by ER physician.  Follow metabolic panel.  Check UA.  If creatinine worsens may consider further imaging. Fall likely from imbalance.  Get physical therapy consult. Hypertension on carvedilol  amlodipine  hydralazine .  Holding ARB due to acute on chronic kidney disease. HFpEF   Did receive 1 dose of Lasix  in the ER.  Will hold off further Lasix  for now.  Last EF was in 2021.  Showed EF of 55 to 60%.  Since patient has exertional dyspnea will check 2D echo.  Mild edema of the lower extremity check Dopplers. Elevated troponin but flat.  Denies chest pain.  Check 2D echo. Paroxysmal atrial fibrillation presently in sinus rhythm.  Continue Eliquis  and beta-blockers. OSA on CPAP. Anemia follow CBC.  On iron  supplements. Depression and anxiety on Zoloft  Wellbutrin  and BuSpar . Hyperlipidemia on statins. Cognitive impairment on Aricept . Thyroid  calcification seen on the CAT scan will need further workup as outpatient.  Since patient has possible pneumonia will need close monitoring further workup and more than 2 midnight stay.   DVT prophylaxis: Lovenox .  Eliquis . Code Status: Full code. Family Communication: Patient's daughters. Disposition Plan: Monitored bed. Consults called:  Physical therapy. Admission status: Observation.

## 2023-11-04 NOTE — ED Notes (Signed)
 CCMD called for cardiac monitoring.

## 2023-11-04 NOTE — ED Triage Notes (Signed)
 Patient fell out of bed, was reaching for her wheelchair when she fell out of her bed. She fell forward and hit her concrete floor. The patient is experiencing R sided head, elbow, and hip pain. Witnessed fall, no LOC. On eliquis , hx stroke with R sided deficits, hz afib. AxOx4. On RA.     EMS vitals and interventions: BP 184/100 HR 70 O2 98 on RA RR 18 CBG - None No meds/IVs

## 2023-11-04 NOTE — Progress Notes (Signed)
 Orthopedic Tech Progress Note Patient Details:  Susan [name] 11/15/1940 [id number] Level 2 Trauma. Not needed Patient ID: Susan [name], female   DOB: 06-25-1940, 83 y.o.   MRN: [id number]  [name] 11/04/2023, 12:12 PM

## 2023-11-04 NOTE — ED Provider Notes (Signed)
 [place] EMERGENCY DEPARTMENT AT [facility] Provider Note   CSN: [id number] Arrival date & time: 11/04/23  1207     Patient presents with: No chief complaint on file.   Susan [name] is a 83 y.o. female.   This is a 83 year old female who presents after falling while trying to restart her wheelchair from her bed.  She was approximately 2 feet off the ground and fell onto tile floor plate struck the right side of her head.  She was on Eliquis  but did not have any loss of consciousness.  Complains of pain to the right side of her head along with right elbow pain along with right hip pain.  Denies any headache.  Has had no confusion.  No nausea or vomiting.  Slight pain to her right ribs.  No abdominal discomfort.  EMS called and patient transported here.       Prior to Admission medications   Medication Sig Start Date End Date Taking? Authorizing Provider  albuterol  (VENTOLIN  HFA) 108 (90 Base) MCG/ACT inhaler Inhale 1-2 puffs into the lungs every 6 (six) hours as needed for wheezing or shortness of breath.    [provider]  amLODipine  (NORVASC ) 5 MG tablet Take 1 tablet (5 mg total) by mouth daily. 03/12/23   Drusilla Sabas RAMAN, MD  ARIPiprazole  (ABILIFY ) 10 MG tablet Take 10 mg by mouth every evening.    [provider]  buPROPion  (WELLBUTRIN  XL) 150 MG 24 hr tablet Take 150 mg by mouth daily. 10/14/22   [provider]  busPIRone  (BUSPAR ) 15 MG tablet Take 15 mg by mouth 2 (two) times daily.    [provider]  Calcium  Carb-Cholecalciferol  (CALCIUM  600+D3) 600-10 MG-MCG TABS Take 1 tablet by mouth at bedtime.    [provider]  cetirizine (ZYRTEC) 10 MG tablet Take 10 mg by mouth daily. 06/20/21   [provider]  diclofenac  Sodium (VOLTAREN ) 1 % GEL Apply 4 g topically 4 (four) times daily. 09/22/23   Emil Share, DO  donepezil  (ARICEPT ) 10 MG tablet Take 1 tablet (10 mg total) by mouth at bedtime. For memory loss 05/25/18    Caro Harlene POUR, NP  ELIQUIS  2.5 MG TABS tablet TAKE 1 TABLET BY MOUTH 2 TIMES DAILY 01/04/22   Lelon Hamilton T, PA-C  FEROSUL 325 (65 Fe) MG tablet Take 325 mg by mouth 2 (two) times daily. 06/21/21   [provider]  hydrALAZINE  (APRESOLINE ) 25 MG tablet TAKE 1 TABLET BY MOUTH 3 TIMES DAILY Patient taking differently: Take 25 mg by mouth 3 (three) times daily. 06/17/18   Eubanks, Jessica K, NP  losartan  (COZAAR ) 50 MG tablet TAKE 1 TABLET BY MOUTH EVERY DAY Patient taking differently: Take 50 mg by mouth daily. 08/07/18   Caro Harlene POUR, NP  Menthol , Topical Analgesic, (BIOFREEZE ROLL-ON) 4 % GEL Apply topically.    [provider]  Multiple Vitamin (GNP ESSENTIAL ONE DAILY) TABS TAKE 1 TABLET BY MOUTH EVERY DAY Patient taking differently: Take 1 tablet by mouth daily after breakfast. 06/17/18   Eubanks, Jessica K, NP  Nutritional Supplements (ENSURE HIGH PROTEIN) LIQD Take 237 mLs by mouth daily.    [provider]  polyethylene glycol (MIRALAX  / GLYCOLAX ) 17 g packet Take 17 g by mouth daily as needed for mild constipation. 03/11/23   Drusilla Sabas RAMAN, MD  pravastatin  (PRAVACHOL ) 40 MG tablet Take 2 tablets (80 mg total) by mouth daily. 08/04/23   Rosemarie Eather RAMAN, MD  saccharomyces  boulardii (FLORASTOR) 250 MG capsule Take 1 capsule (250 mg total) by mouth 2 (two) times daily. 03/11/23   Drusilla Sabas RAMAN, MD  sertraline  (ZOLOFT ) 100 MG tablet Take 100 mg by mouth daily.    [provider]  SYMBICORT 80-4.5 MCG/ACT inhaler Inhale 2 puffs into the lungs in the morning and at bedtime. 02/08/21   [provider]  TYLENOL  500 MG tablet Take 1,000 mg by mouth every 8 (eight) hours as needed for mild pain (pain score 1-3) or headache.    [provider]    Allergies: Ace inhibitors, Lipitor [atorvastatin], Simvastatin, Abilify  [aripiprazole ], and Latex    Review of Systems  All other systems reviewed and are negative.   Updated Vital Signs There were  no vitals taken for this visit.  Physical Exam Vitals and nursing note reviewed.  Constitutional:      General: She is not in acute distress.    Appearance: Normal appearance. She is well-developed. She is not toxic-appearing.  HENT:     Head: Normocephalic and atraumatic.  Eyes:     General: Lids are normal.     Conjunctiva/sclera: Conjunctivae normal.     Pupils: Pupils are equal, round, and reactive to light.  Neck:     Thyroid : No thyroid  mass.     Trachea: No tracheal deviation.  Cardiovascular:     Rate and Rhythm: Normal rate and regular rhythm.     Heart sounds: Normal heart sounds. No murmur heard.    No gallop.  Pulmonary:     Effort: Pulmonary effort is normal. No respiratory distress.     Breath sounds: Normal breath sounds. No stridor. No decreased breath sounds, wheezing, rhonchi or rales.  Abdominal:     General: There is no distension.     Palpations: Abdomen is soft.     Tenderness: There is no abdominal tenderness. There is no rebound.  Musculoskeletal:        General: No tenderness.       Arms:     Cervical back: Normal range of motion and neck supple.     Right hip: No deformity. Decreased range of motion.     Comments: No shortening or rotation of the right lower extremity.  Neurovascular status intact at right foot  Skin:    General: Skin is warm and dry.     Findings: No abrasion or rash.  Neurological:     Mental Status: She is alert and oriented to person, place, and time. Mental status is at baseline.     GCS: GCS eye subscore is 4. GCS verbal subscore is 5. GCS motor subscore is 6.     Cranial Nerves: No cranial nerve deficit.     Sensory: No sensory deficit.     Motor: Motor function is intact.  Psychiatric:        Attention and Perception: Attention normal.        Speech: Speech normal.        Behavior: Behavior normal.     (all labs ordered are listed, but only abnormal results are displayed) Labs Reviewed - No data to  display  EKG: None  Radiology: No results found.   Procedures   Medications Ordered in the ED - No data to display                                  Medical Decision Making Amount and/or Complexity  of Data Reviewed Labs: ordered. Radiology: ordered. ECG/medicine tests: ordered.   Patient's EKG shows normal sinus rhythm unchanged on prior.  CT of her head and cervical spine without acute findings.  Right hip x-ray negative for fracture.  Chest x-ray shows probable cardiomegaly.  Patient's granddaughter at bedside notes that patient has been more short of breath.  She has not increased cough today.  Patient still denies any chest pain.  Labs been ordered at this time.  Will sign off to next provider     Final diagnoses:  None    ED Discharge Orders     None          Dasie Faden, MD 11/04/23 1431

## 2023-11-05 ENCOUNTER — Inpatient Hospital Stay (HOSPITAL_COMMUNITY)

## 2023-11-05 ENCOUNTER — Observation Stay (HOSPITAL_COMMUNITY)

## 2023-11-05 DIAGNOSIS — Z6837 Body mass index (BMI) 37.0-37.9, adult: Secondary | ICD-10-CM | POA: Diagnosis not present

## 2023-11-05 DIAGNOSIS — F32A Depression, unspecified: Secondary | ICD-10-CM | POA: Diagnosis present

## 2023-11-05 DIAGNOSIS — Z79899 Other long term (current) drug therapy: Secondary | ICD-10-CM | POA: Diagnosis not present

## 2023-11-05 DIAGNOSIS — E041 Nontoxic single thyroid nodule: Secondary | ICD-10-CM | POA: Diagnosis present

## 2023-11-05 DIAGNOSIS — A0472 Enterocolitis due to Clostridium difficile, not specified as recurrent: Secondary | ICD-10-CM | POA: Diagnosis present

## 2023-11-05 DIAGNOSIS — F0153 Vascular dementia, unspecified severity, with mood disturbance: Secondary | ICD-10-CM | POA: Diagnosis present

## 2023-11-05 DIAGNOSIS — I5033 Acute on chronic diastolic (congestive) heart failure: Secondary | ICD-10-CM | POA: Diagnosis present

## 2023-11-05 DIAGNOSIS — Y92003 Bedroom of unspecified non-institutional (private) residence as the place of occurrence of the external cause: Secondary | ICD-10-CM | POA: Diagnosis not present

## 2023-11-05 DIAGNOSIS — R609 Edema, unspecified: Secondary | ICD-10-CM

## 2023-11-05 DIAGNOSIS — Z833 Family history of diabetes mellitus: Secondary | ICD-10-CM | POA: Diagnosis not present

## 2023-11-05 DIAGNOSIS — N1832 Chronic kidney disease, stage 3b: Secondary | ICD-10-CM | POA: Diagnosis present

## 2023-11-05 DIAGNOSIS — Z7901 Long term (current) use of anticoagulants: Secondary | ICD-10-CM | POA: Diagnosis not present

## 2023-11-05 DIAGNOSIS — E785 Hyperlipidemia, unspecified: Secondary | ICD-10-CM | POA: Diagnosis present

## 2023-11-05 DIAGNOSIS — G8929 Other chronic pain: Secondary | ICD-10-CM | POA: Diagnosis present

## 2023-11-05 DIAGNOSIS — I130 Hypertensive heart and chronic kidney disease with heart failure and stage 1 through stage 4 chronic kidney disease, or unspecified chronic kidney disease: Secondary | ICD-10-CM | POA: Diagnosis present

## 2023-11-05 DIAGNOSIS — I69351 Hemiplegia and hemiparesis following cerebral infarction affecting right dominant side: Secondary | ICD-10-CM | POA: Diagnosis not present

## 2023-11-05 DIAGNOSIS — D649 Anemia, unspecified: Secondary | ICD-10-CM | POA: Diagnosis present

## 2023-11-05 DIAGNOSIS — K5732 Diverticulitis of large intestine without perforation or abscess without bleeding: Secondary | ICD-10-CM | POA: Diagnosis present

## 2023-11-05 DIAGNOSIS — Z8249 Family history of ischemic heart disease and other diseases of the circulatory system: Secondary | ICD-10-CM | POA: Diagnosis not present

## 2023-11-05 DIAGNOSIS — Z7951 Long term (current) use of inhaled steroids: Secondary | ICD-10-CM | POA: Diagnosis not present

## 2023-11-05 DIAGNOSIS — J189 Pneumonia, unspecified organism: Secondary | ICD-10-CM | POA: Diagnosis present

## 2023-11-05 DIAGNOSIS — F0154 Vascular dementia, unspecified severity, with anxiety: Secondary | ICD-10-CM | POA: Diagnosis present

## 2023-11-05 DIAGNOSIS — I4819 Other persistent atrial fibrillation: Secondary | ICD-10-CM | POA: Diagnosis present

## 2023-11-05 DIAGNOSIS — R0609 Other forms of dyspnea: Secondary | ICD-10-CM

## 2023-11-05 DIAGNOSIS — Z1152 Encounter for screening for COVID-19: Secondary | ICD-10-CM | POA: Diagnosis not present

## 2023-11-05 DIAGNOSIS — N179 Acute kidney failure, unspecified: Secondary | ICD-10-CM | POA: Diagnosis present

## 2023-11-05 DIAGNOSIS — Q2112 Patent foramen ovale: Secondary | ICD-10-CM | POA: Diagnosis not present

## 2023-11-05 DIAGNOSIS — W050XXA Fall from non-moving wheelchair, initial encounter: Secondary | ICD-10-CM | POA: Diagnosis present

## 2023-11-05 LAB — BASIC METABOLIC PANEL WITH GFR
Anion gap: 11 (ref 5–15)
BUN: 33 mg/dL — ABNORMAL HIGH (ref 8–23)
CO2: 20 mmol/L — ABNORMAL LOW (ref 22–32)
Calcium: 9.5 mg/dL (ref 8.9–10.3)
Chloride: 110 mmol/L (ref 98–111)
Creatinine, Ser: 1.76 mg/dL — ABNORMAL HIGH (ref 0.44–1.00)
GFR, Estimated: 28 mL/min — ABNORMAL LOW (ref >60)
Glucose, Bld: 103 mg/dL — ABNORMAL HIGH (ref 70–99)
Potassium: 3.8 mmol/L (ref 3.5–5.1)
Sodium: 141 mmol/L (ref 135–145)

## 2023-11-05 LAB — ECHOCARDIOGRAM COMPLETE
AR max vel: 1.77 cm2
AV Area VTI: 1.77 cm2
AV Area mean vel: 1.77 cm2
AV Mean grad: 5 mmHg
AV Peak grad: 10 mmHg
Ao pk vel: 1.58 m/s
Area-P 1/2: 2.66 cm2
Calc EF: 50 %
Height: 61 in
MV M vel: 5.58 m/s
MV Peak grad: 124.5 mmHg
Radius: 0.5 cm
S' Lateral: 3.8 cm
Single Plane A2C EF: 62 %
Single Plane A4C EF: 41 %
Weight: 3200 [oz_av]

## 2023-11-05 LAB — RESP PANEL BY RT-PCR (RSV, FLU A&B, COVID)  RVPGX2
Influenza A by PCR: NEGATIVE
Influenza B by PCR: NEGATIVE
Resp Syncytial Virus by PCR: NEGATIVE
SARS Coronavirus 2 by RT PCR: NEGATIVE

## 2023-11-05 LAB — CBC
HCT: 33.2 % — ABNORMAL LOW (ref 36.0–46.0)
Hemoglobin: 11.2 g/dL — ABNORMAL LOW (ref 12.0–15.0)
MCH: 32.3 pg (ref 26.0–34.0)
MCHC: 33.7 g/dL (ref 30.0–36.0)
MCV: 95.7 fL (ref 80.0–100.0)
Platelets: 273 K/uL (ref 150–400)
RBC: 3.47 MIL/uL — ABNORMAL LOW (ref 3.87–5.11)
RDW: 14.2 % (ref 11.5–15.5)
WBC: 4.8 K/uL (ref 4.0–10.5)
nRBC: 0 % (ref 0.0–0.2)

## 2023-11-05 LAB — URINALYSIS, ROUTINE W REFLEX MICROSCOPIC
Bacteria, UA: NONE SEEN
Bilirubin Urine: NEGATIVE
Glucose, UA: NEGATIVE mg/dL
Hgb urine dipstick: NEGATIVE
Ketones, ur: NEGATIVE mg/dL
Leukocytes,Ua: NEGATIVE
Nitrite: NEGATIVE
Protein, ur: NEGATIVE mg/dL
Specific Gravity, Urine: 1.016 (ref 1.005–1.030)
pH: 5 (ref 5.0–8.0)

## 2023-11-05 MED ORDER — CARVEDILOL 3.125 MG PO TABS
3.1250 mg | ORAL_TABLET | Freq: Two times a day (BID) | ORAL | Status: DC
  Administered 2023-11-05 – 2023-11-07 (×5): 3.125 mg via ORAL
  Filled 2023-11-05 (×5): qty 1

## 2023-11-05 MED ORDER — SODIUM CHLORIDE 0.9 % IV SOLN
2.0000 g | INTRAVENOUS | Status: DC
  Administered 2023-11-06: 2 g via INTRAVENOUS
  Filled 2023-11-05: qty 20

## 2023-11-05 MED ORDER — DM-GUAIFENESIN ER 30-600 MG PO TB12
1.0000 | ORAL_TABLET | Freq: Two times a day (BID) | ORAL | Status: DC
  Administered 2023-11-05 – 2023-11-07 (×5): 1 via ORAL
  Filled 2023-11-05 (×5): qty 1

## 2023-11-05 MED ORDER — GABAPENTIN 100 MG PO CAPS
200.0000 mg | ORAL_CAPSULE | Freq: Three times a day (TID) | ORAL | Status: DC
  Administered 2023-11-05 – 2023-11-07 (×7): 200 mg via ORAL
  Filled 2023-11-05 (×7): qty 2

## 2023-11-05 MED ORDER — BENZONATATE 100 MG PO CAPS
100.0000 mg | ORAL_CAPSULE | Freq: Three times a day (TID) | ORAL | Status: DC
  Administered 2023-11-05 – 2023-11-07 (×7): 100 mg via ORAL
  Filled 2023-11-05 (×8): qty 1

## 2023-11-05 MED ORDER — POLYETHYLENE GLYCOL 3350 17 G PO PACK: 17.0000 g | PACK | Freq: Every day | ORAL | Status: DC | PRN

## 2023-11-05 MED ORDER — SODIUM CHLORIDE 0.9 % IV SOLN
2.0000 g | Freq: Once | INTRAVENOUS | Status: AC
  Administered 2023-11-05: 2 g via INTRAVENOUS
  Filled 2023-11-05: qty 20

## 2023-11-05 MED ORDER — LORATADINE 10 MG PO TABS
10.0000 mg | ORAL_TABLET | Freq: Every day | ORAL | Status: DC
Start: 2023-11-05 — End: 2023-11-07
  Administered 2023-11-05 – 2023-11-07 (×3): 10 mg via ORAL
  Filled 2023-11-05 (×3): qty 1

## 2023-11-05 MED ORDER — SODIUM CHLORIDE 0.9 % IV SOLN
100.0000 mg | Freq: Two times a day (BID) | INTRAVENOUS | Status: DC
  Administered 2023-11-05 – 2023-11-06 (×3): 100 mg via INTRAVENOUS
  Filled 2023-11-05 (×4): qty 100

## 2023-11-05 MED ORDER — AMLODIPINE BESYLATE 2.5 MG PO TABS
2.5000 mg | ORAL_TABLET | Freq: Every day | ORAL | Status: DC
Start: 2023-11-05 — End: 2023-11-06
  Administered 2023-11-05 – 2023-11-06 (×2): 2.5 mg via ORAL
  Filled 2023-11-05 (×2): qty 1

## 2023-11-05 MED ORDER — PANTOPRAZOLE SODIUM 40 MG PO TBEC
40.0000 mg | DELAYED_RELEASE_TABLET | Freq: Every day | ORAL | Status: DC
Start: 2023-11-05 — End: 2023-11-07
  Administered 2023-11-05 – 2023-11-07 (×3): 40 mg via ORAL
  Filled 2023-11-05 (×3): qty 1

## 2023-11-05 NOTE — Progress Notes (Signed)
 VASCULAR LAB    Bilateral lower extremity venous duplex has been performed.  See CV proc for preliminary results.   Acacia Latorre, RVT 11/05/2023, 5:27 PM

## 2023-11-05 NOTE — Progress Notes (Signed)
 [facility] night cross cover note:   I was notified by the patient's RN  of the pt's 4 loose bm's today. Currently on iv abx. I added c diff pcr to further evaluate.     Eva Pore, DO Hospitalist

## 2023-11-05 NOTE — Evaluation (Addendum)
 Physical Therapy Evaluation Patient Details Name: Susan [name] MRN: [id number] DOB: [birth year] Today's Date: 11/05/2023  History of Present Illness  Pt is 83 year old presented to [facility] on  11/04/23 for fall. Pt with acute on chronic heart failure and possible PNA.  PMH - CVA with rt sided weakness, afib, htn, chf, TKR  Clinical Impression  Pt admitted with above diagnosis and presents to PT with functional limitations due to deficits listed below (See PT problem list). Pt needs skilled PT to maximize independence and safety. Pt is long term care resident at [facility]. Pt reports she was transferring on her own to w/c and was amb into bathroom with walker. I am not sure if this is accurate. Today pt mod/max assist for bed mobility and needed heavy mod assist to stand with Stedy. If pt is not at baseline then would recommend PT at dc and likely could receive that at long term care.           If plan is discharge home, recommend the following: Two people to help with walking and/or transfers;Two people to help with bathing/dressing/bathroom;Assist for transportation   Can travel by private vehicle        Equipment Recommendations None recommended by PT  Recommendations for Other Services       Functional Status Assessment Patient has had a recent decline in their functional status and/or demonstrates limited ability to make significant improvements in function in a reasonable and predictable amount of time     Precautions / Restrictions Precautions Precautions: Fall;Other (comment) Precaution/Restrictions Comments: frequent stools - incontinent Restrictions Weight Bearing Restrictions Per Provider Order: No      Mobility  Bed Mobility Overal bed mobility: Needs Assistance Bed Mobility: Supine to Sit, Sit to Supine, Rolling Rolling: Min assist, Max assist (Min to rt. and Max to lt)   Supine to sit: Mod assist, HOB elevated Sit to supine: Max assist   General bed mobility  comments: Assist to elevate trunk into sitting and bring hips to EOB. Assist to guide trunk to supine and to bring legs up into bed    Transfers Overall transfer level: Needs assistance Equipment used: Ambulation equipment used Transfers: Sit to/from Stand Sit to Stand: Mod assist, From elevated surface, Via lift equipment           General transfer comment: Attempted to stand x 4 with rolling walker and pt unable with max assist. Used Stedy and pt able to stand with heavy mod assist to power up from elevated bed. Pt with flexed standing position    Ambulation/Gait               General Gait Details: Unable  Stairs            Wheelchair Mobility     Tilt Bed    Modified Rankin (Stroke Patients Only)       Balance Overall balance assessment: Needs assistance Sitting-balance support: Feet supported, Single extremity supported, Bilateral upper extremity supported Sitting balance-Leahy Scale: Poor Sitting balance - Comments: UE support Postural control: Right lateral lean Standing balance support: Bilateral upper extremity supported, During functional activity Standing balance-Leahy Scale: Poor Standing balance comment: Steady and min assist to maintaing flexed standing position                             Pertinent Vitals/Pain Pain Assessment Pain Assessment: No/denies pain    Home Living Family/patient expects to be  discharged to:: Skilled nursing facility (Long term care pt)                        Prior Function Prior Level of Function : Patient poor historian/Family not available             Mobility Comments: Primarily w/c bound. Pt states she is able to get in w/c on her own and walks into the bathroom with a walker. - Unsure if this is accurate       Extremity/Trunk Assessment   Upper Extremity Assessment Upper Extremity Assessment: Defer to OT evaluation    Lower Extremity Assessment Lower Extremity Assessment:  Generalized weakness    Cervical / Trunk Assessment Cervical / Trunk Assessment: Other exceptions Cervical / Trunk Exceptions: scoliosis  Communication   Communication Communication: No apparent difficulties    Cognition Arousal: Alert Behavior During Therapy: WFL for tasks assessed/performed   PT - Cognitive impairments: Sequencing, Problem solving, Memory                         Following commands: Impaired Following commands impaired: Follows one step commands with increased time     Cueing Cueing Techniques: Verbal cues, Tactile cues     General Comments General comments (skin integrity, edema, etc.): VSS    Exercises     Assessment/Plan    PT Assessment Patient needs continued PT services  PT Problem List Decreased strength;Decreased balance;Decreased mobility;Decreased knowledge of use of DME;Obesity       PT Treatment Interventions DME instruction;Functional mobility training;Therapeutic activities;Therapeutic exercise;Balance training;Patient/family education    PT Goals (Current goals can be found in the Care Plan section)  Acute Rehab PT Goals Patient Stated Goal: not stated PT Goal Formulation: With patient Time For Goal Achievement: 11/19/23 Potential to Achieve Goals: Fair    Frequency Min 1X/week     Co-evaluation               AM-PAC PT 6 Clicks Mobility  Outcome Measure Help needed turning from your back to your side while in a flat bed without using bedrails?: A Lot Help needed moving from lying on your back to sitting on the side of a flat bed without using bedrails?: A Lot Help needed moving to and from a bed to a chair (including a wheelchair)?: Total Help needed standing up from a chair using your arms (e.g., wheelchair or bedside chair)?: Total Help needed to walk in hospital room?: Total Help needed climbing 3-5 steps with a railing? : Total 6 Click Score: 8    End of Session Equipment Utilized During Treatment:  Gait belt Activity Tolerance: Patient tolerated treatment well Patient left: in bed;Other (comment) (going to test in bed)   PT Visit Diagnosis: Other abnormalities of gait and mobility (R26.89);Muscle weakness (generalized) (M62.81);History of falling (Z91.81)    Time: 8484-8454 PT Time Calculation (min) (ACUTE ONLY): 30 min   Charges:   PT Evaluation $PT Eval Moderate Complexity: 1 Mod PT Treatments $Therapeutic Activity: 8-22 mins PT General Charges $$ ACUTE PT VISIT: 1 Visit         [facility] PT Acute Rehabilitation Services Office [phone]   [place]gers ORN [facility] 11/05/2023, 5:06 PM

## 2023-11-05 NOTE — TOC Initial Note (Signed)
 Transition of Care [facility]) - Initial/Assessment Note    Patient Details  Name: Susan [name] MRN: [id number] Date of Birth: 27-Aug-1940  Transition [facility]) CM/SW Contact:    Luise JAYSON Pan, LCSWA Phone Number: 11/05/2023, 11:07 AM  Clinical Narrative:  Patient is from [facility]. CSW spoke with [name], pts dtr, about patient returning to [place]. [name] stated patient can return but family would also like to see if patient can go to STR at a SNF as well. CSW awaiting PT/OT eval.   CSW will continue to follow.             Expected Discharge Plan:  (TBD) Barriers to Discharge: Continued Medical Work up   Patient Goals and CMS Choice Patient states their goals for this hospitalization and ongoing recovery are:: To get better          Expected Discharge Plan and Services In-house Referral: Clinical Social Work   Post Acute Care Choice: Skilled Nursing Facility Living arrangements for the past 2 months: Skilled Nursing Facility                                      Prior Living Arrangements/Services Living arrangements for the past 2 months: Skilled Nursing Facility Lives with:: Facility Resident Patient language and need for interpreter reviewed:: Yes Do you feel safe going back to the place where you live?: Yes      Need for Family Participation in Patient Care: Yes (Comment) Care giver support system in place?: Yes (comment) Current home services: DME (Rollator, BSC, shower chair, bathroom with grab bars, hospital bed) Criminal Activity/Legal Involvement Pertinent to Current Situation/Hospitalization: No - Comment as needed  Activities of Daily Living   ADL Screening (condition at time of admission) Independently performs ADLs?: No Does the patient have a NEW difficulty with bathing/dressing/toileting/self-feeding that is expected to last >3 days?: No Does the patient have a NEW difficulty with getting in/out of bed, walking, or climbing stairs that is  expected to last >3 days?: No Does the patient have a NEW difficulty with communication that is expected to last >3 days?: No Is the patient deaf or have difficulty hearing?: No Does the patient have difficulty seeing, even when wearing glasses/contacts?: No Does the patient have difficulty concentrating, remembering, or making decisions?: No  Permission Sought/Granted Permission sought to share information with : Facility Medical Sales Representative, Family Supports Permission granted to share information with : No (Family in chart)  Share Information with NAME: [name]  Permission granted to share info w AGENCY: SNFs  Permission granted to share info w Relationship: Daughter  Permission granted to share info w Contact Information: [phone]  Emotional Assessment Appearance:: Appears stated age Attitude/Demeanor/Rapport: Unable to Assess Affect (typically observed): Unable to Assess Orientation: : Oriented to Place, Oriented to Self, Oriented to  Time, Oriented to Situation Alcohol / Substance Use: Not Applicable Psych Involvement: No (comment)  Admission diagnosis:  CHF (congestive heart failure) (HCC) [I50.9] SOB (shortness of breath) [R06.02] Patient Active Problem List   Diagnosis Date Noted   CAP (community acquired pneumonia) 11/05/2023   CHF (congestive heart failure) (HCC) 11/04/2023   Acute on chronic heart failure with preserved ejection fraction (HFpEF) (HCC) 11/04/2023   SOB (shortness of breath) 11/04/2023   Acute cystitis without hematuria 02/27/2023   Fall 10/24/2022   Generalized weakness 10/24/2022   Troponin level elevated 10/24/2022   Hypercalcemia 10/24/2022  Sterile pyuria 10/24/2022   Chronic diastolic CHF (congestive heart failure) (HCC) 10/24/2022   Paroxysmal atrial fibrillation (HCC) 10/24/2022   AKI (acute kidney injury) 10/24/2022   Renal failure (ARF), acute on chronic 10/23/2022   Preoperative cardiovascular examination 07/08/2021   Suicidal  ideation 07/05/2021   Acute pain of left knee 03/01/2020   Hx of total knee replacement, left 02/01/2020   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   Anemia of chronic disease 09/07/2018   Primary osteoarthritis of left knee 06/16/2018   Venous stasis dermatitis of left lower extremity 04/29/2018   Dementia (HCC) 03/12/2018   Recurrent cellulitis 02/12/2018   Essential hypertension 02/12/2018   Cellulitis and abscess of leg 12/29/2017   CKD (chronic kidney disease) stage 3, GFR 30-59 ml/min (HCC) 12/29/2017   Cellulitis of left lower extremity    Ankle edema    Sepsis (HCC) 12/21/2017   Medication management 10/11/2016   Hyperlipidemia 06/18/2016   Gait abnormality 06/18/2016   Major depressive disorder, recurrent episode, 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   Edema 02/09/2015   History of CVA (cerebrovascular accident) 02/01/2015   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 (HCC)    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)    Chronic low back pain 01/28/2015   Short-term memory loss 01/28/2015   Volume depletion 01/28/2015   Persistent atrial fibrillation 01/26/2015   DCM (dilated cardiomyopathy) (HCC) 01/26/2015   TIA (transient ischemic attack) 01/24/2015   (HFpEF) heart failure with preserved ejection fraction (HCC)    Obesity (BMI 30-39.9) 01/27/2013   Constipation 09/02/2012   H/O arthroscopy of right knee 08/06/2012   Anxiety    Major depressive disorder    GERD (gastroesophageal reflux disease)    Hypertensive heart disease    OSA (obstructive sleep apnea)    Osteoarthritis of right knee    Hyperparathyroidism, primary 04/22/2012   Chronic cough 10/02/2011   PCP:  Claudene Prentice DELENA Mickey., FNP Pharmacy:   [facility] [place], [place] - [address] [place] [address] Phone: [phone] Fax: [phone]  [facility] - [place], [place]  - [address] [place] [address] Phone: [phone] Fax: [phone]     Social Drivers of Health (SDOH) Social History: SDOH Screenings   Food Insecurity: No Food Insecurity (11/04/2023)  Housing: Low Risk  (11/04/2023)  Transportation Needs: No Transportation Needs (11/04/2023)  Utilities: Not At Risk (11/04/2023)  Alcohol Screen: Low Risk  (10/21/2017)  Depression (PHQ2-9): Medium Risk (07/05/2021)  Financial Resource Strain: Low Risk  (11/17/2017)  Physical Activity: Insufficiently Active (11/17/2017)  Social Connections: Moderately Integrated (11/04/2023)  Stress: Stress Concern Present (11/17/2017)  Tobacco Use: Medium Risk (11/04/2023)   SDOH Interventions:     Readmission Risk Interventions    03/11/2023   12:01 PM 02/28/2023   10:04 AM  Readmission Risk Prevention Plan  Transportation Screening Complete Complete  PCP or Specialist Appt within 3-5 Days Complete Complete  HRI or Home Care Consult Complete Complete  Social Work Consult for Recovery Care Planning/Counseling Complete Complete  Palliative Care Screening Not Applicable Not Applicable  Medication Review Oceanographer) Complete Complete

## 2023-11-05 NOTE — Progress Notes (Signed)
Placed patient on CPAP for the night via auto-mode.  

## 2023-11-05 NOTE — Progress Notes (Signed)
 [facility] Progress Note Patient: Susan [name] FMW:[id number] DOB: 08-25-1940  DOA: 11/04/2023 DOS: the patient was seen and examined on 11/05/2023  Brief Hospital Course: Patient with PMH of HTN, paroxysmal A-fib, HLD, CKD 3B, chronic HFpEF presented to the hospital with complaints of fall.  Also had complaints of shortness of breath and cough for last 1 month. Currently being treated for pneumonia.  Assessment and Plan: Community-acquired pneumonia Presented with cough and shortness of breath. CT scan shows possible infiltrate/inflammation. Currently receiving IV antibiotic. COVID and flu negative. Monitor for improvement.  Chronic HFpEF. CT scan shows evidence of possible infiltrate versus volume overload. Received IV Lasix  in the ED. Echocardiogram ordered.  Will monitor report.  Trace lower extremity edema. With tenderness lower extremity doctor have been ordered. Will monitor report.  Acute on chronic kidney disease stage IIIb  creatinine mildly increased from baseline. Hold ARB. Received Lasix . Monitor.  Fall  likely from imbalance.  PT consulted.  Postremoval most likely return back to long-term care facility.  Hypertension  Blood pressure stable. On carvedilol  amlodipine  hydralazine .  Holding ARB due to acute on chronic kidney disease.  Elevated troponin Not consistent with ACS territory. EKG unremarkable. Echocardiogram ordered but pending. Troponins are flat.  Paroxysmal atrial fibrillation  presently in sinus rhythm.  Continue Eliquis  and beta-blockers.  OSA  Continue CPAP.  Normocytic anemia  H&H stable. Monitor. Continue iron  supplementation.  Depression and anxiety  on Zoloft  Wellbutrin  and BuSpar .  Hyperlipidemia  on statins.  Cognitive impairment  on Aricept . No behavioral issues.  Thyroid  calcification  seen on the CAT scan will need further workup as outpatient.  Abdominal pain. Ventral hernia. Diarrhea. Patient reports  that she has 2-4 loose watery bowel movements on a daily basis for last 2 weeks. She also reports abdominal tenderness as well. Will check CT abdomen to rule out further abnormality. May need CT for rule out if has 3 BM a day.  Obesity Class 2 Body mass index is 37.79 kg/m.  Placing the pt at higher risk of poor outcomes.   Subjective: No acute complaint other than feeling weak. On exam reported abdominal pain and reports that she has been having this pain for a while. Further conversation shows that the patient is also suffering from diarrhea 2-4 bowel movements on daily basis.  Physical Exam: General: in Mild distress, No Rash Cardiovascular: S1 and S2 Present, No Murmur Respiratory: Good respiratory effort, Bilateral Air entry present.  Faint crackles, No wheezes Abdomen: Bowel Sound present, mild central abdominal tenderness Extremities: Trace edema Neuro: Alert and oriented x3, no new focal deficit, speech staccato.  Data Reviewed: I have Reviewed nursing notes, Vitals, and Lab results. Since last encounter, pertinent lab results CBC and BMP   . I have ordered test including CBC and BMP  . I have ordered imaging CT abdomen and pelvis  .   Disposition: Status is: Inpatient Remains inpatient appropriate because: Monitor for improvement in abdominal pain oral intake and renal function  apixaban  (ELIQUIS ) tablet 2.5 mg Start: 11/04/23 2330 apixaban  (ELIQUIS ) tablet 2.5 mg   Family Communication: Discussed with daughter on the phone Level of care: Telemetry   Vitals:   11/05/23 0320 11/05/23 0600 11/05/23 0757 11/05/23 1606  BP: (!) 156/81 (!) 159/101 (!) 142/96 (!) 171/87  Pulse: 62  61 70  Resp: 17 12 20 16   Temp: 97.8 F (36.6 C)  97.8 F (36.6 C) 97.7 F (36.5 C)  TempSrc: Temporal   Oral  SpO2: 99% 97%  100% 99%  Weight:      Height:         Author: Yetta Blanch, MD 11/05/2023 5:32 PM  Please look on [email or web address] to find out who is on call.

## 2023-11-05 NOTE — Care Management Obs Status (Signed)
 MEDICARE OBSERVATION STATUS NOTIFICATION   Patient Details  Name: Susan [name] MRN: [id number] Date of Birth: Feb 23, 1940   Medicare Observation Status Notification Given:  Yes    Vonzell Arrie Sharps 11/05/2023, 9:03 AM

## 2023-11-05 NOTE — Hospital Course (Addendum)
 Patient with PMH of HTN, paroxysmal A-fib, HLD, CKD 3B, chronic HFpEF presented to the hospital with complaints of fall.  Also had complaints of shortness of breath and cough for last 1 month. Currently being treated for pneumonia.  Assessment and Plan: Concern for community-acquired pneumonia Presented with cough and shortness of breath. CT scan shows possible infiltrate/inflammation. Currently receiving IV antibiotic. COVID and flu negative. Monitor for improvement.  Acute sigmoid diverticulitis. Reported abdominal pain and diarrhea. CT abdomen shows evidence of diverticulosis and sigmoid colon diverticulitis. Abdominal pain unchanged and chronic. Diarrhea appears to be improving. Continue ceftriaxone  and Flagyl. C. difficile antigen positive.  PCR and toxin negative. Continue isolation. Outpatient follow-up with GI for an colonoscopy after recovery.  Chronic HFpEF. CT scan shows evidence of possible infiltrate versus volume overload. Received IV Lasix  in the ED. Echocardiogram shows EF of 55 to 60%.  Concern for left-to-right interatrial shunt Echocardiogram showed evidence of possible left-to-right shunt. Bubble study ruled out. Patient does not have any significant symptoms for that as well. For now monitor.  Pedal edema. Doppler negative for DVT.  Acute on chronic kidney disease stage IIIb  Baseline appears to be around 1.3. Upon admission creatinine was 1.7. Trending up to 1.9. Initiating IV hydration. Received IV Lasix  in the ED.  ARB on hold.  CT is negative for any obstruction or hydronephrosis. Treat diarrhea.  Fall  likely from imbalance.  PT consulted. Return back to her long-term care facility with physical therapy.  Hypertension  Blood pressure stable. On carvedilol  amlodipine  hydralazine .  Holding ARB due to acute on chronic kidney disease.  Elevated troponin Not consistent with ACS territory. EKG unremarkable. Echocardiogram negative for any  wall motion abnormality as well. Troponins are flat.  Paroxysmal atrial fibrillation  presently in sinus rhythm.  Continue Eliquis  and beta-blockers.  OSA  Continue CPAP.  Normocytic anemia  H&H stable. Monitor. Continue iron  supplementation.  Depression and anxiety  on Zoloft  Wellbutrin  and BuSpar .  Hyperlipidemia  on statins.  Cognitive impairment  on Aricept . No behavioral issues.  Thyroid  calcification  seen on the CAT scan will need further workup as outpatient.  Ventral hernia. Monitor.  History of repair of mesh.  No incarceration or acute abnormality with the hernia on CT scan.  Patient has chronic pain related to this.  Obesity Class 2 Body mass index is 37.79 kg/m.  Placing the pt at higher risk of poor outcomes.

## 2023-11-05 NOTE — Progress Notes (Signed)
  Echocardiogram 2D Echocardiogram has been performed.  Susan [name] 11/05/2023, 11:45 AM

## 2023-11-06 ENCOUNTER — Inpatient Hospital Stay (HOSPITAL_COMMUNITY)

## 2023-11-06 DIAGNOSIS — Q2112 Patent foramen ovale: Secondary | ICD-10-CM

## 2023-11-06 DIAGNOSIS — J189 Pneumonia, unspecified organism: Secondary | ICD-10-CM | POA: Diagnosis not present

## 2023-11-06 LAB — BASIC METABOLIC PANEL WITH GFR
Anion gap: 12 (ref 5–15)
Anion gap: 12 (ref 5–15)
BUN: 30 mg/dL — ABNORMAL HIGH (ref 8–23)
BUN: 28 mg/dL — ABNORMAL HIGH (ref 8–23)
CO2: 19 mmol/L — ABNORMAL LOW (ref 22–32)
CO2: 18 mmol/L — ABNORMAL LOW (ref 22–32)
Calcium: 8.7 mg/dL — ABNORMAL LOW (ref 8.9–10.3)
Calcium: 9 mg/dL (ref 8.9–10.3)
Chloride: 109 mmol/L (ref 98–111)
Chloride: 109 mmol/L (ref 98–111)
Creatinine, Ser: 1.93 mg/dL — ABNORMAL HIGH (ref 0.44–1.00)
Creatinine, Ser: 2.03 mg/dL — ABNORMAL HIGH (ref 0.44–1.00)
GFR, Estimated: 25 mL/min — ABNORMAL LOW (ref >60)
GFR, Estimated: 24 mL/min — ABNORMAL LOW (ref >60)
Glucose, Bld: 101 mg/dL — ABNORMAL HIGH (ref 70–99)
Glucose, Bld: 107 mg/dL — ABNORMAL HIGH (ref 70–99)
Potassium: 3.9 mmol/L (ref 3.5–5.1)
Potassium: 4.1 mmol/L (ref 3.5–5.1)
Sodium: 140 mmol/L (ref 135–145)
Sodium: 139 mmol/L (ref 135–145)

## 2023-11-06 LAB — CBC
HCT: 30.3 % — ABNORMAL LOW (ref 36.0–46.0)
Hemoglobin: 10 g/dL — ABNORMAL LOW (ref 12.0–15.0)
MCH: 31.5 pg (ref 26.0–34.0)
MCHC: 33 g/dL (ref 30.0–36.0)
MCV: 95.6 fL (ref 80.0–100.0)
Platelets: 241 K/uL (ref 150–400)
RBC: 3.17 MIL/uL — ABNORMAL LOW (ref 3.87–5.11)
RDW: 14.4 % (ref 11.5–15.5)
WBC: 4.9 K/uL (ref 4.0–10.5)
nRBC: 0 % (ref 0.0–0.2)

## 2023-11-06 LAB — C DIFFICILE QUICK SCREEN W PCR REFLEX
C Diff antigen: POSITIVE — AB
C Diff toxin: NEGATIVE

## 2023-11-06 LAB — URINALYSIS, COMPLETE (UACMP) WITH MICROSCOPIC
Bilirubin Urine: NEGATIVE
Glucose, UA: NEGATIVE mg/dL
Hgb urine dipstick: NEGATIVE
Ketones, ur: NEGATIVE mg/dL
Nitrite: NEGATIVE
Protein, ur: NEGATIVE mg/dL
Specific Gravity, Urine: 1.012 (ref 1.005–1.030)
pH: 5 (ref 5.0–8.0)

## 2023-11-06 LAB — CREATININE, URINE, RANDOM: Creatinine, Urine: 64 mg/dL

## 2023-11-06 LAB — CLOSTRIDIUM DIFFICILE BY PCR, REFLEXED
Hypervirulent Strain: NEGATIVE
Toxigenic C. Difficile by PCR: NEGATIVE

## 2023-11-06 LAB — SODIUM, URINE, RANDOM: Sodium, Ur: 157 mmol/L

## 2023-11-06 LAB — OSMOLALITY, URINE: Osmolality, Ur: 522 mosm/kg (ref 300–900)

## 2023-11-06 LAB — ECHOCARDIOGRAM LIMITED BUBBLE STUDY

## 2023-11-06 MED ORDER — LACTATED RINGERS IV BOLUS
500.0000 mL | Freq: Once | INTRAVENOUS | Status: AC
  Administered 2023-11-06: 500 mL via INTRAVENOUS

## 2023-11-06 MED ORDER — AMLODIPINE BESYLATE 5 MG PO TABS
5.0000 mg | ORAL_TABLET | Freq: Every day | ORAL | Status: DC
  Administered 2023-11-07: 5 mg via ORAL
  Filled 2023-11-06: qty 1

## 2023-11-06 MED ORDER — SODIUM CHLORIDE 0.9 % IV SOLN: INTRAVENOUS | Status: DC

## 2023-11-06 MED ORDER — DOXYCYCLINE HYCLATE 100 MG PO TABS: 100.0000 mg | ORAL_TABLET | Freq: Two times a day (BID) | ORAL | Status: DC

## 2023-11-06 MED ORDER — HYDRALAZINE HCL 50 MG PO TABS
50.0000 mg | ORAL_TABLET | Freq: Three times a day (TID) | ORAL | Status: DC
  Administered 2023-11-06 – 2023-11-07 (×3): 50 mg via ORAL
  Filled 2023-11-06 (×3): qty 1

## 2023-11-06 MED ORDER — SACCHAROMYCES BOULARDII 250 MG PO CAPS
250.0000 mg | ORAL_CAPSULE | Freq: Two times a day (BID) | ORAL | Status: DC
  Administered 2023-11-06 – 2023-11-07 (×3): 250 mg via ORAL
  Filled 2023-11-06 (×3): qty 1

## 2023-11-06 MED ORDER — METRONIDAZOLE 500 MG/100ML IV SOLN
500.0000 mg | Freq: Two times a day (BID) | INTRAVENOUS | Status: DC
  Administered 2023-11-06 (×2): 500 mg via INTRAVENOUS
  Filled 2023-11-06 (×3): qty 100

## 2023-11-06 MED ORDER — AMLODIPINE BESYLATE 2.5 MG PO TABS: 2.5000 mg | ORAL_TABLET | Freq: Once | ORAL | Status: AC

## 2023-11-06 MED ORDER — LACTATED RINGERS IV SOLN: INTRAVENOUS | Status: DC

## 2023-11-06 MED ORDER — CHOLESTYRAMINE 4 G PO PACK
2.0000 g | PACK | Freq: Once | ORAL | Status: AC
  Administered 2023-11-06: 2 g via ORAL
  Filled 2023-11-06: qty 1

## 2023-11-06 MED ORDER — SODIUM CHLORIDE 0.9 % IV SOLN
2.0000 g | INTRAVENOUS | Status: DC
  Administered 2023-11-06: 2 g via INTRAVENOUS
  Filled 2023-11-06: qty 20

## 2023-11-06 MED ORDER — SODIUM CHLORIDE 0.9 % IV SOLN: 1.0000 g | INTRAVENOUS | Status: DC

## 2023-11-06 NOTE — Progress Notes (Signed)
 PHARMACY NOTE:  ANTIMICROBIAL RENAL DOSAGE ADJUSTMENT  Current antimicrobial regimen includes a mismatch between antimicrobial dosage and estimated renal function.  As per policy approved by the Pharmacy & Therapeutics and Medical Executive Committees, the antimicrobial dosage will be adjusted accordingly.  Current antimicrobial dosage:  ceftriaxone  1g IV q24h  Indication: IAI  Renal Function:  Estimated Creatinine Clearance: 21.9 mL/min (A) (by C-G formula based on SCr of 1.93 mg/dL (H)). []      On intermittent HD, scheduled: []      On CRRT    Antimicrobial dosage has been changed to:  2g q24h  Additional comments:   Thank you for allowing pharmacy to be a part of this patient's care.  Ozell ONEIDA [place], [facility] 11/06/2023 9:53 AM

## 2023-11-06 NOTE — TOC Progression Note (Addendum)
 Transition of Care [facility]) - Progression Note    Patient Details  Name: Susan [name] MRN: [id number] Date of Birth: [birth year]  Transition [facility]) CM/SW Contact  Luise JAYSON Pan, [place] Phone Number: 11/06/2023, 10:18 AM  Clinical Narrative:  Per [facility], patient can return when MR. Family did not do a bed hold and facility has 1 female bed left.  CSW left HIPAA compliant voicemail for [name] regarding PT recs.   10:30 AM Per bedside RN, patient not medically ready as she is on IV abx.   10:39 AM Per Tammy with [facility], facility can hold patients bed until tomorrow. After tomorrow 10/31, facility will place patient as first on the list for the first available bed.   4:10 PM RNCM notified CSW that a caseworker from [facility] called to speak with CSW. RNCM provided contact number for D[facility] [phone]). CSW called back and call went to general operator who stated that without Cindy's last name they cannot locate her. CSW inquired about what [employer] company was for- per operator, [employer] work with kidney disease patients.   CSW will continue to follow.    Expected Discharge Plan: Long Term Nursing Home Barriers to Discharge: Continued Medical Work up               Expected Discharge Plan and Services In-house Referral: Clinical Social Work   Post Acute Care Choice: Skilled Nursing Facility Living arrangements for the past 2 months: Skilled Nursing Facility                                       Social Drivers of Health (SDOH) Interventions SDOH Screenings   Food Insecurity: No Food Insecurity (11/04/2023)  Housing: Low Risk  (11/04/2023)  Transportation Needs: No Transportation Needs (11/04/2023)  Utilities: Not At Risk (11/04/2023)  Alcohol Screen: Low Risk  (10/21/2017)  Depression (PHQ2-9): Medium Risk (07/05/2021)  Financial Resource Strain: Low Risk  (11/17/2017)  Physical Activity: Insufficiently Active (11/17/2017)  Social  Connections: Moderately Integrated (11/04/2023)  Stress: Stress Concern Present (11/17/2017)  Tobacco Use: Medium Risk (11/04/2023)    Readmission Risk Interventions    03/11/2023   12:01 PM 02/28/2023   10:04 AM  Readmission Risk Prevention Plan  Transportation Screening Complete Complete  PCP or Specialist Appt within 3-5 Days Complete Complete  HRI or Home Care Consult Complete Complete  Social Work Consult for Recovery Care Planning/Counseling Complete Complete  Palliative Care Screening Not Applicable Not Applicable  Medication Review Oceanographer) Complete Complete

## 2023-11-06 NOTE — Progress Notes (Signed)
 [facility] Progress Note Patient: Susan [name] FMW:[id number] DOB: 1940-03-19  DOA: 11/04/2023 DOS: the patient was seen and examined on 11/06/2023  Brief Hospital Course: Patient with PMH of HTN, paroxysmal A-fib, HLD, CKD 3B, chronic HFpEF presented to the hospital with complaints of fall.  Also had complaints of shortness of breath and cough for last 1 month. Currently being treated for pneumonia.  Assessment and Plan: Concern for community-acquired pneumonia Presented with cough and shortness of breath. CT scan shows possible infiltrate/inflammation. Currently receiving IV antibiotic. COVID and flu negative. Monitor for improvement.  Acute sigmoid diverticulitis. Reported abdominal pain and diarrhea. CT abdomen shows evidence of diverticulosis and sigmoid colon diverticulitis. Abdominal pain unchanged and chronic. Diarrhea appears to be improving. Continue ceftriaxone  and Flagyl. C. difficile antigen positive.  PCR and toxin negative. Continue isolation. Outpatient follow-up with GI for an colonoscopy after recovery.  Chronic HFpEF. CT scan shows evidence of possible infiltrate versus volume overload. Received IV Lasix  in the ED. Echocardiogram shows EF of 55 to 60%.  Concern for left-to-right interatrial shunt Echocardiogram showed evidence of possible left-to-right shunt. Bubble study ruled out. Patient does not have any significant symptoms for that as well. For now monitor.  Pedal edema. Doppler negative for DVT.  Acute on chronic kidney disease stage IIIb  Baseline appears to be around 1.3. Upon admission creatinine was 1.7. Trending up to 1.9. Initiating IV hydration. Received IV Lasix  in the ED.  ARB on hold.  CT is negative for any obstruction or hydronephrosis. Treat diarrhea.  Fall  likely from imbalance.  PT consulted. Return back to her long-term care facility with physical therapy.  Hypertension  Blood pressure stable. On carvedilol   amlodipine  hydralazine .  Holding ARB due to acute on chronic kidney disease.  Elevated troponin Not consistent with ACS territory. EKG unremarkable. Echocardiogram negative for any wall motion abnormality as well. Troponins are flat.  Paroxysmal atrial fibrillation  presently in sinus rhythm.  Continue Eliquis  and beta-blockers.  OSA  Continue CPAP.  Normocytic anemia  H&H stable. Monitor. Continue iron  supplementation.  Depression and anxiety  on Zoloft  Wellbutrin  and BuSpar .  Hyperlipidemia  on statins.  Cognitive impairment  on Aricept . No behavioral issues.  Thyroid  calcification  seen on the CAT scan will need further workup as outpatient.  Ventral hernia. Monitor.  History of repair of mesh.  No incarceration or acute abnormality with the hernia on CT scan.  Patient has chronic pain related to this.  Obesity Class 2 Body mass index is 37.79 kg/m.  Placing the pt at higher risk of poor outcomes.    Subjective: Monitor for improvement in diarrhea frequency.  So far had 4 bowel movements.  No nausea no vomiting.  Oral intake improving.  Physical Exam: Basal crackles. S1-S2 present Bowel sound present Unchanged abdominal tenderness. Unchanged lower extremity edema.  Data Reviewed: I have Reviewed nursing notes, Vitals, and Lab results. Since last encounter, pertinent lab results CBC and BMP   . I have ordered test including CBC and BMP  .  Ordered echocardiogram with bubble study based on the prior echocardiogram report.  Disposition: Status is: Inpatient Remains inpatient appropriate because: Monitor for improvement in renal function  apixaban  (ELIQUIS ) tablet 2.5 mg Start: 11/04/23 2330 apixaban  (ELIQUIS ) tablet 2.5 mg   Family Communication: Discussed with daughter on the phone on 10/29. Level of care: Telemetry   Vitals:   11/06/23 0900 11/06/23 0901 11/06/23 1336 11/06/23 1653  BP:  (!) 163/76 (!) 168/97 (!) 189/90  Pulse:  70 74 68  Resp: (!)  22 (!) 21 14   Temp:      TempSrc:      SpO2: 98% 95% 94%   Weight:      Height:         Author: Yetta Blanch, MD 11/06/2023 5:51 PM  Please look on [email or web address] to find out who is on call.

## 2023-11-06 NOTE — Progress Notes (Signed)
 Echocardiogram 2D Echocardiogram has been performed.  Thea Norlander 11/06/2023, 5:46 PM

## 2023-11-07 DIAGNOSIS — J189 Pneumonia, unspecified organism: Secondary | ICD-10-CM | POA: Diagnosis not present

## 2023-11-07 LAB — CBC
HCT: 33.6 % — ABNORMAL LOW (ref 36.0–46.0)
Hemoglobin: 11 g/dL — ABNORMAL LOW (ref 12.0–15.0)
MCH: 31.9 pg (ref 26.0–34.0)
MCHC: 32.7 g/dL (ref 30.0–36.0)
MCV: 97.4 fL (ref 80.0–100.0)
Platelets: 221 K/uL (ref 150–400)
RBC: 3.45 MIL/uL — ABNORMAL LOW (ref 3.87–5.11)
RDW: 14.2 % (ref 11.5–15.5)
WBC: 4.3 K/uL (ref 4.0–10.5)
nRBC: 0 % (ref 0.0–0.2)

## 2023-11-07 LAB — BASIC METABOLIC PANEL WITH GFR
Anion gap: 10 (ref 5–15)
BUN: 23 mg/dL (ref 8–23)
CO2: 19 mmol/L — ABNORMAL LOW (ref 22–32)
Calcium: 8.9 mg/dL (ref 8.9–10.3)
Chloride: 109 mmol/L (ref 98–111)
Creatinine, Ser: 1.57 mg/dL — ABNORMAL HIGH (ref 0.44–1.00)
GFR, Estimated: 32 mL/min — ABNORMAL LOW (ref >60)
Glucose, Bld: 169 mg/dL — ABNORMAL HIGH (ref 70–99)
Potassium: 3.9 mmol/L (ref 3.5–5.1)
Sodium: 138 mmol/L (ref 135–145)

## 2023-11-07 LAB — MAGNESIUM: Magnesium: 1.5 mg/dL — ABNORMAL LOW (ref 1.7–2.4)

## 2023-11-07 MED ORDER — MAGNESIUM SULFATE 2 GM/50ML IV SOLN
2.0000 g | Freq: Once | INTRAVENOUS | Status: AC
  Administered 2023-11-07: 2 g via INTRAVENOUS
  Filled 2023-11-07: qty 50

## 2023-11-07 MED ORDER — CHOLESTYRAMINE 4 G PO PACK
4.0000 g | PACK | Freq: Once | ORAL | Status: AC
Start: 2023-11-07 — End: 2023-11-07
  Administered 2023-11-07: 4 g via ORAL
  Filled 2023-11-07: qty 1

## 2023-11-07 MED ORDER — METRONIDAZOLE 500 MG PO TABS: 500.0000 mg | ORAL_TABLET | Freq: Two times a day (BID) | ORAL | 0 refills | Status: AC

## 2023-11-07 MED ORDER — CEFADROXIL 500 MG PO CAPS: 500.0000 mg | ORAL_CAPSULE | Freq: Every day | ORAL | 0 refills | Status: AC

## 2023-11-07 NOTE — NC FL2 (Signed)
 [place]  MEDICAID [id number] LEVEL OF CARE FORM     IDENTIFICATION  Patient Name: Susan [name] Birthdate: [birth year] Sex: female Admission Date (Current Location): 11/04/2023  [place] and [place] Number:  [place] [id number] [facility] Facility and Address:  [facility], [address], [place], [place] [address]      Provider Number: [id number]  Attending Physician Name and Address:  Tobie Yetta HERO, MD  Relative Name and Phone Number:  Reylynn, [place]ine (Daughter)  [phone] [facility])    Current Level of Care: Hospital Recommended Level of Care: Skilled Nursing Facility Prior Approval Number:    Date Approved/Denied:   PASRR Number: [id number]  Discharge Plan: SNF    Current Diagnoses: Patient Active Problem List   Diagnosis Date Noted   CAP (community acquired pneumonia) 11/05/2023   CHF (congestive heart failure) (HCC) 11/04/2023   Acute on chronic heart failure with preserved ejection fraction (HFpEF) (HCC) 11/04/2023   SOB (shortness of breath) 11/04/2023   Acute cystitis without hematuria 02/27/2023   Fall 10/24/2022   Generalized weakness 10/24/2022   Troponin level elevated 10/24/2022   Hypercalcemia 10/24/2022   Sterile pyuria 10/24/2022   Chronic diastolic CHF (congestive heart failure) (HCC) 10/24/2022   Paroxysmal atrial fibrillation (HCC) 10/24/2022   AKI (acute kidney injury) 10/24/2022   Renal failure (ARF), acute on chronic 10/23/2022   Preoperative cardiovascular examination 07/08/2021   Suicidal ideation 07/05/2021   Acute pain of left knee 03/01/2020   Hx of total knee replacement, left 02/01/2020   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   Anemia of chronic disease 09/07/2018   Primary osteoarthritis of left knee 06/16/2018   Venous stasis dermatitis of left lower extremity 04/29/2018   Dementia (HCC) 03/12/2018   Recurrent cellulitis 02/12/2018    Essential hypertension 02/12/2018   Cellulitis and abscess of leg 12/29/2017   CKD (chronic kidney disease) stage 3, GFR 30-59 ml/min (HCC) 12/29/2017   Cellulitis of left lower extremity    Ankle edema    Sepsis (HCC) 12/21/2017   Medication management 10/11/2016   Hyperlipidemia 06/18/2016   Gait abnormality 06/18/2016   Major depressive disorder, recurrent episode, 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   Edema 02/09/2015   History of CVA (cerebrovascular accident) 02/01/2015   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 (HCC)    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)    Chronic low back pain 01/28/2015   Short-term memory loss 01/28/2015   Volume depletion 01/28/2015   Persistent atrial fibrillation 01/26/2015   DCM (dilated cardiomyopathy) (HCC) 01/26/2015   TIA (transient ischemic attack) 01/24/2015   (HFpEF) heart failure with preserved ejection fraction (HCC)    Obesity (BMI 30-39.9) 01/27/2013   Constipation 09/02/2012   H/O arthroscopy of right knee 08/06/2012   Anxiety    Major depressive disorder    GERD (gastroesophageal reflux disease)    Hypertensive heart disease    OSA (obstructive sleep apnea)    Osteoarthritis of right knee    Hyperparathyroidism, primary 04/22/2012   Chronic cough 10/02/2011    Orientation RESPIRATION BLADDER Height & Weight     Self, Time, Situation, Place  Normal Incontinent Weight: 200 lb (90.7 kg) Height:  5' 1 (154.9 cm)  BEHAVIORAL SYMPTOMS/MOOD NEUROLOGICAL BOWEL NUTRITION STATUS      Incontinent Diet (Please see discharge summary)  AMBULATORY STATUS COMMUNICATION OF NEEDS Skin     Verbally  Personal Care Assistance Level of Assistance  Bathing, Feeding, Dressing Bathing Assistance:  (Please see discharge summary) Feeding assistance:  (Please see discharge summary) Dressing Assistance:  (Please see discharge summary)     Functional Limitations Info  Sight, Speech, Hearing Sight  Info: Impaired (eyeglasses) Hearing Info: Adequate Speech Info: Adequate    SPECIAL CARE FACTORS FREQUENCY                       Contractures Contractures Info: Not present    Additional Factors Info  Code Status, Isolation Precautions, Allergies, Psychotropic Code Status Info: Full Allergies Info: Ace Inhibitors, Lipitor (Atorvastatin), Simvastatin, Abilify  (Aripiprazole ), Latex Psychotropic Info: zoloft , gabapentin , buspar , wellbutrin  xl   Isolation Precautions Info: Enteric precautions     Current Medications (11/07/2023):  This is the current hospital active medication list Current Facility-Administered Medications  Medication Dose Route Frequency Provider Last Rate Last Admin   0.9 %  sodium chloride  infusion   Intravenous Continuous Patel, Pranav M, MD   Stopped at 11/06/23 2147   amLODipine  (NORVASC ) tablet 5 mg  5 mg Oral Daily Patel, Pranav M, MD   5 mg at 11/07/23 9141   apixaban  (ELIQUIS ) tablet 2.5 mg  2.5 mg Oral BID Kakrakandy, Arshad N, MD   2.5 mg at 11/07/23 1211   benzonatate (TESSALON) capsule 100 mg  100 mg Oral TID Patel, Pranav M, MD   100 mg at 11/07/23 1211   buPROPion  (WELLBUTRIN  XL) 24 hr tablet 150 mg  150 mg Oral Daily Kakrakandy, Arshad N, MD   150 mg at 11/07/23 1212   busPIRone  (BUSPAR ) tablet 15 mg  15 mg Oral BID Franky Redia SAILOR, MD   15 mg at 11/07/23 1211   carvedilol  (COREG ) tablet 3.125 mg  3.125 mg Oral BID WC Kakrakandy, Arshad N, MD   3.125 mg at 11/07/23 0610   cefTRIAXone  (ROCEPHIN ) 2 g in sodium chloride  0.9 % 100 mL IVPB  2 g Intravenous Q24H Cyndy Ozell DASEN, [facility]   Stopped at 11/06/23 2329   dextromethorphan -guaiFENesin  (MUCINEX  DM) 30-600 MG per 12 hr tablet 1 tablet  1 tablet Oral BID Patel, Pranav M, MD   1 tablet at 11/07/23 1211   donepezil  (ARICEPT ) tablet 10 mg  10 mg Oral QHS Franky Redia SAILOR, MD   10 mg at 11/06/23 2135   ferrous sulfate  tablet 325 mg  325 mg Oral BID Franky Redia SAILOR, MD   325 mg at 11/07/23  1211   fluticasone  furoate-vilanterol (BREO ELLIPTA ) 100-25 MCG/ACT 1 puff  1 puff Inhalation Daily Franky Redia SAILOR, MD   1 puff at 11/07/23 1213   gabapentin  (NEURONTIN ) capsule 200 mg  200 mg Oral TID Patel, Pranav M, MD   200 mg at 11/07/23 1211   hydrALAZINE  (APRESOLINE ) tablet 50 mg  50 mg Oral TID Patel, Pranav M, MD   50 mg at 11/07/23 9141   loratadine  (CLARITIN ) tablet 10 mg  10 mg Oral Daily Patel, Pranav M, MD   10 mg at 11/07/23 1212   magnesium  sulfate IVPB 2 g 50 mL  2 g Intravenous Once Patel, Pranav M, MD 50 mL/hr at 11/07/23 1200 2 g at 11/07/23 1200   metroNIDAZOLE (FLAGYL) IVPB 500 mg  500 mg Intravenous Q12H Patel, Pranav M, MD   Stopped at 11/06/23 2250   pantoprazole  (PROTONIX ) EC tablet 40 mg  40 mg Oral Daily Patel, Pranav M, MD   40 mg at 11/07/23 1212   pravastatin  (PRAVACHOL ) tablet  80 mg  80 mg Oral Daily Franky Redia SAILOR, MD   80 mg at 11/07/23 1212   saccharomyces boulardii (FLORASTOR) capsule 250 mg  250 mg Oral BID Patel, Pranav M, MD   250 mg at 11/07/23 1211   sertraline  (ZOLOFT ) tablet 100 mg  100 mg Oral Daily Franky Redia SAILOR, MD   100 mg at 11/07/23 1211     Discharge Medications: Please see discharge summary for a list of discharge medications.  Relevant Imaging Results:  Relevant Lab Results:   Additional Information SSN: [id number]  Luise JAYSON Pan, LCSWA

## 2023-11-07 NOTE — TOC Progression Note (Addendum)
 Transition of Care [facility]) - Progression Note    Patient Details  Name: NUSAYBA CADENAS MRN: [id number] Date of Birth: Jun 13, 1940  Transition of Care [facility]) CM/SW Contact  Luise JAYSON Pan, [place] Phone Number: 11/07/2023, 10:25 AM  Clinical Narrative:  Per daily meeting with treatment team, awaiting labs and monitoring patients [place] level. Per facility, they have a cpap for patient.  Randine (patients daughter)  [phone], called CSW about CSWs note from yesterday regarding [facility] calling. Per Randine, patient information is not to be released to [facility] as they are trying to solicit patient.   Randine stated that she is reachable in the event her other two sisters cannot be reached. Randine also inquired about ALF resources for patient. CSW will email Randine resources to [email or web address].   CSW will continue to follow.    Expected Discharge Plan: Long Term Nursing Home Barriers to Discharge: Continued Medical Work up               Expected Discharge Plan and Services In-house Referral: Clinical Social Work   Post Acute Care Choice: Skilled Nursing Facility Living arrangements for the past 2 months: Skilled Nursing Facility                                       Social Drivers of Health (SDOH) Interventions SDOH Screenings   Food Insecurity: No Food Insecurity (11/04/2023)  Housing: Low Risk  (11/04/2023)  Transportation Needs: No Transportation Needs (11/04/2023)  Utilities: Not At Risk (11/04/2023)  Alcohol Screen: Low Risk  (10/21/2017)  Depression (PHQ2-9): Medium Risk (07/05/2021)  Financial Resource Strain: Low Risk  (11/17/2017)  Physical Activity: Insufficiently Active (11/17/2017)  Social Connections: Moderately Integrated (11/04/2023)  Stress: Stress Concern Present (11/17/2017)  Tobacco Use: Medium Risk (11/04/2023)    Readmission Risk Interventions    03/11/2023   12:01 PM 02/28/2023   10:04 AM  Readmission Risk Prevention Plan   Transportation Screening Complete Complete  PCP or Specialist Appt within 3-5 Days Complete Complete  HRI or Home Care Consult Complete Complete  Social Work Consult for Recovery Care Planning/Counseling Complete Complete  Palliative Care Screening Not Applicable Not Applicable  Medication Review Oceanographer) Complete Complete

## 2023-11-07 NOTE — Progress Notes (Signed)
 Heart Failure Navigator Progress Note  Assessed for Heart & Vascular TOC clinic readiness.  Patient does not meet criteria due to EF 55-60%, history of Chronic CHF, admitted with PNA, No HF TOC. .   Navigator will sign off at this time.   Stephane Haddock, BSN, Scientist, Clinical (histocompatibility And Immunogenetics) Only

## 2023-11-07 NOTE — TOC Transition Note (Signed)
 Transition of Care [facility]) - Discharge Note   Patient Details  Name: Susan [name] MRN: [id number] Date of Birth: July 18, 1940  Transition of Care [facility]) CM/SW Contact:  Luise JAYSON Pan, LCSWA Phone Number: 11/07/2023, 12:58 PM   Clinical Narrative:   Patient will DC to: [facility] SNF Anticipated DC date: 11/07/23   Family notified: Robinson,Tracey (sis) [phone]  Transport by: [facility]    Per MD patient ready for DC to [facility] RN to call report prior to discharge [phone]; room 102B). RN, patient, patient's family, and facility notified of DC. Discharge Summary and FL2 sent to facility. DC packet on chart. Ambulance transport requested for patient 1:00 PM.   CSW will sign off for now as social work intervention is no longer needed. Please consult us  again if new needs arise.      Final next level of care: Long Term Nursing Home Barriers to Discharge: Barriers Resolved   Patient Goals and CMS Choice Patient states their goals for this hospitalization and ongoing recovery are:: To get better          Discharge Placement              Patient chooses bed at: Other - please specify in the comment section below: [facility]) Patient to be transferred to facility by: [facility] Name of family member notified: Robinson,Tracey  (dtr) [phone] Patient and family notified of of transfer: 11/07/23  Discharge Plan and Services Additional resources added to the After Visit Summary for   In-house Referral: Clinical Social Work   Post Acute Care Choice: Skilled Nursing Facility                               Social Drivers of Health (SDOH) Interventions SDOH Screenings   Food Insecurity: No Food Insecurity (11/04/2023)  Housing: Low Risk  (11/04/2023)  Transportation Needs: No Transportation Needs (11/04/2023)  Utilities: Not At Risk (11/04/2023)  Alcohol Screen: Low Risk  (10/21/2017)  Depression (PHQ2-9): Medium Risk (07/05/2021)  Financial Resource Strain: Low  Risk  (11/17/2017)  Physical Activity: Insufficiently Active (11/17/2017)  Social Connections: Moderately Integrated (11/04/2023)  Stress: Stress Concern Present (11/17/2017)  Tobacco Use: Medium Risk (11/04/2023)     Readmission Risk Interventions    03/11/2023   12:01 PM 02/28/2023   10:04 AM  Readmission Risk Prevention Plan  Transportation Screening Complete Complete  PCP or Specialist Appt within 3-5 Days Complete Complete  HRI or Home Care Consult Complete Complete  Social Work Consult for Recovery Care Planning/Counseling Complete Complete  Palliative Care Screening Not Applicable Not Applicable  Medication Review Oceanographer) Complete Complete

## 2023-11-07 NOTE — Discharge Summary (Signed)
 Physician Discharge Summary   Patient: Susan [name] MRN: [id number] DOB: 05-Apr-1985  Admit date:     11/04/2023  Discharge date: 11/07/23  Discharge Physician: Yetta Blanch  PCP: Claudene Prentice DELENA Mickey., FNP  Recommendations at discharge:  Follow up with PCP in  1 week with repeat BMP Will need Gastroenterology referral for colonoscopy 6-8 weeks after   Follow-up Information     Claudene Prentice DELENA Mickey., FNP. Schedule an appointment as soon as possible for a visit in 1 week(s).   Specialty: Family Medicine Why: with BMP lab to look at kidney/electrolyte numbers, with CBC lab to look at blood counts Contact information: [address] [place] [address] [phone]                Hospital Course: Patient with PMH of HTN, paroxysmal A-fib, HLD, CKD 3B, chronic HFpEF presented to the hospital with complaints of fall.  Also had complaints of shortness of breath and cough for last 1 month. Currently being treated for pneumonia diverticulitis and AKI  Assessment and Plan: Concern for community-acquired pneumonia Presented with cough and shortness of breath. CT scan shows possible infiltrate/inflammation. Currently receiving IV antibiotic. Continue cefadroxyl upon discharge  COVID and flu negative.  Acute sigmoid diverticulitis. Reported abdominal pain and diarrhea. CT abdomen shows evidence of diverticulosis and sigmoid colon diverticulitis. Abdominal pain unchanged and chronic. Diarrhea appears to be improving. Continue ceftriaxone  and Flagyl. Continue oral Antibiotics upon discharge.  C. difficile antigen positive.  PCR and toxin negative. Continue isolation. Outpatient follow-up with GI for an colonoscopy after recovery. Tolerating oral diet and abdominal pain resolved.   Chronic HFpEF. CT scan shows evidence of possible infiltrate versus volume overload. Received IV Lasix  in the ED. Echocardiogram shows EF of 55 to 60%.  Concern for left-to-right interatrial  shunt Echocardiogram showed evidence of possible left-to-right shunt. Bubble study ruled out interatrial shunt. Patient does not have any significant symptoms for that as well. For now monitor.  Pedal edema. Doppler negative for DVT.  Acute on chronic kidney disease stage IIIb  Baseline appears to be around 1.3. Upon admission creatinine was 1.7. Trending up to 1.9. better with IV hydration. Received IV Lasix  in the ED.  ARB on hold.   CT is negative for any obstruction or hydronephrosis.  Fall  likely from imbalance.  PT consulted. Return back to her long-term care facility with physical therapy.  Hypertension  Blood pressure stable. On carvedilol  amlodipine  hydralazine .  Holding ARB due to acute on chronic kidney disease.  Elevated troponin Not consistent with ACS territory. EKG unremarkable. Echocardiogram negative for any wall motion abnormality as well. Troponins are flat.  Paroxysmal atrial fibrillation  presently in sinus rhythm.  Continue Eliquis  and beta-blockers.  OSA  Continue CPAP.  Normocytic anemia  H&H stable. Monitor. Continue iron  supplementation.  Depression and anxiety  on Zoloft  Wellbutrin  and BuSpar .  Hyperlipidemia  on statins.  Cognitive impairment  on Aricept . No behavioral issues.  Thyroid  calcification  seen on the CAT scan will need further workup as outpatient.  Ventral hernia. Monitor.  History of repair of mesh.  No incarceration or acute abnormality with the hernia on CT scan.  Patient has chronic pain related to this.  Obesity Class 2 Body mass index is 37.79 kg/m.  Placing the pt at higher risk of poor outcomes.   Hypomagnesemia  Replaced.   Consultants:  none  Procedures performed:  none  DISCHARGE MEDICATION: Allergies as of 11/07/2023  Reactions   Ace Inhibitors Cough   Lipitor [atorvastatin] Swelling   Simvastatin    Myalgias    Abilify  [aripiprazole ]    Made her words disconnected and caused  tremors   Latex Itching        Medication List     STOP taking these medications    losartan  50 MG tablet Commonly known as: COZAAR    Symbicort 80-4.5 MCG/ACT inhaler Generic drug: budesonide-formoterol        TAKE these medications    Advair Diskus 100-50 MCG/ACT Aepb Generic drug: fluticasone -salmeterol Inhale 1 puff into the lungs 2 (two) times daily.   albuterol  108 (90 Base) MCG/ACT inhaler Commonly known as: VENTOLIN  HFA Inhale 1-2 puffs into the lungs every 6 (six) hours as needed for wheezing or shortness of breath.   amLODipine  5 MG tablet Commonly known as: NORVASC  Take 1 tablet (5 mg total) by mouth daily. What changed: how much to take   ARIPiprazole  10 MG tablet Commonly known as: ABILIFY  Take 10 mg by mouth every evening.   benzonatate 200 MG capsule Commonly known as: TESSALON Take 200 mg by mouth in the morning, at noon, and at bedtime.   Biofreeze Roll-On 4 % Gel Generic drug: Menthol  (Topical Analgesic) Apply 1 application  topically in the morning and at bedtime. Apply to left and right shoulder for pain   buPROPion  150 MG 24 hr tablet Commonly known as: WELLBUTRIN  XL Take 150 mg by mouth daily.   busPIRone  15 MG tablet Commonly known as: BUSPAR  Take 15 mg by mouth 2 (two) times daily.   Calcium  600+D3 600-10 MG-MCG Tabs Generic drug: Calcium  Carb-Cholecalciferol  Take 1 tablet by mouth daily in the afternoon.   carvedilol  3.125 MG tablet Commonly known as: COREG  Take 3.125 mg by mouth 2 (two) times daily.   cefadroxil 500 MG capsule Commonly known as: DURICEF Take 1 capsule (500 mg total) by mouth daily for 4 days.   cetirizine 10 MG tablet Commonly known as: ZYRTEC Take 10 mg by mouth daily.   dextromethorphan -guaiFENesin  30-600 MG 12hr tablet Commonly known as: MUCINEX  DM Take 1 tablet by mouth 2 (two) times daily.   diclofenac  Sodium 1 % Gel Commonly known as: VOLTAREN  Apply 4 g topically 4 (four) times daily.    donepezil  10 MG tablet Commonly known as: ARICEPT  Take 1 tablet (10 mg total) by mouth at bedtime. For memory loss   Eliquis  2.5 MG Tabs tablet Generic drug: apixaban  TAKE 1 TABLET BY MOUTH 2 TIMES DAILY   Ensure High Protein Liqd Take 237 mLs by mouth daily.   FeroSul 325 (65 Fe) MG tablet Generic drug: ferrous sulfate  Take 325 mg by mouth 2 (two) times daily.   gabapentin  100 MG capsule Commonly known as: NEURONTIN  Take 200 mg by mouth 3 (three) times daily.   GNP Essential One Daily Tabs TAKE 1 TABLET BY MOUTH EVERY DAY What changed: when to take this   hydrALAZINE  25 MG tablet Commonly known as: APRESOLINE  TAKE 1 TABLET BY MOUTH 3 TIMES DAILY   metroNIDAZOLE 500 MG tablet Commonly known as: FLAGYL Take 1 tablet (500 mg total) by mouth 2 (two) times daily for 4 days.   omeprazole  40 MG capsule Commonly known as: PRILOSEC Take 40 mg by mouth daily.   polyethylene glycol 17 g packet Commonly known as: MIRALAX  / GLYCOLAX  Take 17 g by mouth daily as needed for mild constipation.   pravastatin  40 MG tablet Commonly known as: PRAVACHOL  Take 2 tablets (80 mg total)  by mouth daily.   saccharomyces boulardii 250 MG capsule Commonly known as: FLORASTOR Take 1 capsule (250 mg total) by mouth 2 (two) times daily.   sertraline  100 MG tablet Commonly known as: ZOLOFT  Take 100 mg by mouth daily.   TYLENOL  500 MG tablet Generic drug: acetaminophen  Take 1,000 mg by mouth in the morning, at noon, and at bedtime.       Disposition: SNF Diet recommendation: Cardiac diet  Discharge Exam: Vitals:   11/07/23 0020 11/07/23 0405 11/07/23 0815 11/07/23 0830  BP: (!) 195/97 (!) 162/89 (!) 170/101 (!) 170/101  Pulse: 65 64 (!) 55   Resp: 15 13 17    Temp: 98.6 F (37 C) 98.1 F (36.7 C) 98.3 F (36.8 C)   TempSrc:  Oral Oral   SpO2: 97% 95% 98%   Weight:      Height:       General: in Mild distress, No Rash Cardiovascular: S1 and S2 Present, No  Murmur Respiratory: Good respiratory effort, Bilateral Air entry present. basal Crackles, No wheezes Abdomen: Bowel Sound present, No tenderness Extremities: trace edema Neuro: Alert and oriented x3, no new focal deficit   Filed Weights   11/04/23 1217  Weight: 90.7 kg   Condition at discharge: stable  The results of significant diagnostics from this hospitalization (including imaging, microbiology, ancillary and laboratory) are listed below for reference.   Imaging Studies: VAS US  LOWER EXTREMITY VENOUS (DVT) Result Date: 11/06/2023  Lower Venous DVT Study Patient Name:  SAVANA SPINA  Date of Exam:   11/05/2023 Medical Rec #: [id number]     Accession #:    [id number] Date of Birth: 1940/11/06     Patient Gender: F Patient Age:   71 years Exam Location:  [facility] Procedure:      VAS US  LOWER EXTREMITY VENOUS (DVT) Referring Phys: REDIA CLEAVER --------------------------------------------------------------------------------  Indications: Edema and leg pain. Shortness of breath X 1 month. Pneumonia.  Risk Factors: Patient has known venous reflux disease, diagnosed 07/09/18 and 09/28/20. Atrial fibrillation. CKD IIIb, HFpEF Anticoagulation: Eliquis . Limitations: Patient intolerant to compression maneuvers and minimal touch, body habitus, coughing, and poor ultrasound/tissue interface. Comparison Study: Prior negative left LEV done 04/11/22. Performing Technologist: Alberta Lis RVS  Examination Guidelines: A complete evaluation includes B-mode imaging, spectral Doppler, color Doppler, and power Doppler as needed of all accessible portions of each vessel. Bilateral testing is considered an integral part of a complete examination. Limited examinations for reoccurring indications may be performed as noted. The reflux portion of the exam is performed with the patient in reverse Trendelenburg.  +---------+---------------+---------+-----------+----------+-------------------+ RIGHT     CompressibilityPhasicitySpontaneityPropertiesThrombus Aging      +---------+---------------+---------+-----------+----------+-------------------+ CFV      Full           Yes      Yes                                      +---------+---------------+---------+-----------+----------+-------------------+ SFJ      Full                                                             +---------+---------------+---------+-----------+----------+-------------------+ FV Prox  Yes      Yes                  patent by color and                                                       Doppler             +---------+---------------+---------+-----------+----------+-------------------+ FV Mid                  Yes      Yes                  patent by color and                                                       Doppler             +---------+---------------+---------+-----------+----------+-------------------+ FV Distal                                             Not well visualized +---------+---------------+---------+-----------+----------+-------------------+ PFV                     Yes      No                   patent by color and                                                       Doppler             +---------+---------------+---------+-----------+----------+-------------------+ POP      Full           Yes      Yes                                      +---------+---------------+---------+-----------+----------+-------------------+ PTV                                                   Not well visualized +---------+---------------+---------+-----------+----------+-------------------+ PERO                                                  Not well visualized +---------+---------------+---------+-----------+----------+-------------------+   +---------+---------------+---------+-----------+----------+-------------------+ LEFT      CompressibilityPhasicitySpontaneityPropertiesThrombus Aging      +---------+---------------+---------+-----------+----------+-------------------+ CFV                     Yes      No  patent by color and                                                       Doppler             +---------+---------------+---------+-----------+----------+-------------------+ FV Prox                                               patent by color     +---------+---------------+---------+-----------+----------+-------------------+ FV Mid                  Yes      Yes                  patent by color and                                                       Doppler             +---------+---------------+---------+-----------+----------+-------------------+ FV Distal               Yes      Yes                  patent by color and                                                       Doppler             +---------+---------------+---------+-----------+----------+-------------------+ PFV                                                   Not well visualized +---------+---------------+---------+-----------+----------+-------------------+ POP                     Yes      Yes                                      +---------+---------------+---------+-----------+----------+-------------------+ PTV                                                   Not well visualized +---------+---------------+---------+-----------+----------+-------------------+ PERO                                                  Not well visualized +---------+---------------+---------+-----------+----------+-------------------+     Summary: RIGHT: - There is no evidence of deep vein thrombosis  in the lower extremity. However, portions of this examination were limited- see technologist comments above.  LEFT: - There is no evidence of deep vein thrombosis in the lower extremity.  However, portions of this examination were limited- see technologist comments above.  *See table(s) above for measurements and observations. Electronically signed by Debby Robertson on 11/06/2023 at 8:00:21 PM.    Final    ECHOCARDIOGRAM LIMITED BUBBLE STUDY Result Date: 11/06/2023    ECHOCARDIOGRAM LIMITED REPORT   Patient Name:   HOORAIN KOZAKIEWICZ Date of Exam: 11/06/2023 Medical Rec #:  [id number]    Height:       61.0 in Accession #:    [id number]   Weight:       200.0 lb Date of Birth:  [birth year]    BSA:          1.889 m Patient Age:    83 years     BP:           163/76 mmHg Patient Gender: F            HR:           71 bpm. Exam Location:  Inpatient Procedure: Limited Echo, Limited Color Doppler and Saline Contrast Bubble Study            (Both Spectral and Color Flow Doppler were utilized during            procedure). Indications:    Atrial septal defect 745.5 / Q21.1  History:        Patient has prior history of Echocardiogram examinations, most                 recent 11/05/2023. CHF and Cardiomyopathy, Stroke, TIA and CKD,                 stage 3, Arrythmias:Atrial Fibrillation,                 Signs/Symptoms:Shortness of Breath; Risk Factors:Hypertension,                 Sleep Apnea and Dyslipidemia.  Sonographer:    Thea Norlander RCS Referring Phys: YETTA HERO Xan Sparkman  Sonographer Comments: Image acquisition challenging due to uncooperative patient. IMPRESSIONS  1. Left ventricular ejection fraction, by estimation, is 55 to 60%. The left ventricle has normal function.  2. Mild mitral valve regurgitation.  3. The aortic valve is tricuspid. There is moderate calcification of the aortic valve. There is moderate thickening of the aortic valve. Aortic valve sclerosis/calcification is present, without any evidence of aortic stenosis.  4. Agitated saline contrast bubble study was negative, with no evidence of any interatrial shunt. FINDINGS  Left Ventricle: Left ventricular ejection fraction, by estimation, is 55 to  60%. The left ventricle has normal function. Mitral Valve: Mild mitral valve regurgitation. Tricuspid Valve: Tricuspid valve regurgitation is trivial. Aortic Valve: The aortic valve is tricuspid. There is moderate calcification of the aortic valve. There is moderate thickening of the aortic valve. Aortic valve sclerosis/calcification is present, without any evidence of aortic stenosis. IAS/Shunts: No atrial level shunt detected by color flow Doppler. Agitated saline contrast was given intravenously to evaluate for intracardiac shunting. Agitated saline contrast bubble study was negative, with no evidence of any interatrial shunt. Wilbert Bihari MD Electronically signed by Wilbert Bihari MD Signature Date/Time: 11/06/2023/5:47:20 PM    Final    CT ABDOMEN PELVIS WO CONTRAST Result Date: 11/05/2023 CLINICAL DATA:  Abdominal pain. EXAM: CT ABDOMEN AND PELVIS WITHOUT CONTRAST TECHNIQUE:  Multidetector CT imaging of the abdomen and pelvis was performed following the standard protocol without IV contrast. RADIATION DOSE REDUCTION: This exam was performed according to the departmental dose-optimization program which includes automated exposure control, adjustment of the mA and/or kV according to patient size and/or use of iterative reconstruction technique. COMPARISON:  CT abdomen pelvis dated 08/06/2021. FINDINGS: Evaluation of this exam is limited in the absence of intravenous contrast. Lower chest: Bibasilar linear atelectasis/scarring. No intra-abdominal free air or free fluid. Hepatobiliary: The liver is unremarkable. There is mild dilatation of the common bile duct, post cholecystectomy. No retained calcified stone noted in the central CBD. Pancreas: Unremarkable. No pancreatic ductal dilatation or surrounding inflammatory changes. Spleen: Normal in size without focal abnormality. Adrenals/Urinary Tract: The adrenal glands are unremarkable. Two adjacent stones in the inferior pole of the left kidney with combined  length of 1 cm. No hydronephrosis with there is no hydronephrosis or nephrolithiasis on the right. Bilateral renal cysts. The urinary bladder is minimally distended. Diffusely thickened appearance of the bladder wall may be partly related to underdistention or chronic bladder dysfunction. Correlation with urinalysis recommended to exclude cystitis. Stomach/Bowel: There is diffuse colonic diverticulosis. There is mild haziness adjacent to a sigmoid diverticula in the pelvis (45/3 and 66/6) which may be chronic or represent mild acute diverticulitis. Clinical correlation is recommended. No diverticular abscess or perforation. There is no bowel obstruction. The appendix is normal. Vascular/Lymphatic: Advanced aortoiliac atherosclerotic disease. The IVC is unremarkable. No portal venous gas. There is no adenopathy. Reproductive: Multiple uterine fibroids. Other: There is diastasis of anterior abdominal wall musculature with a broad base shallow midline hernia. Prior hernia repair mesh noted. Musculoskeletal: Osteopenia with degenerative changes of the spine. No acute osseous pathology. IMPRESSION: 1. Colonic diverticulosis with possible mild acute sigmoid diverticulitis. No diverticular abscess or perforation. 2. Nonobstructing left renal inferior pole calculi. No hydronephrosis. 3. Uterine fibroids. 4.  Aortic Atherosclerosis (ICD10-I70.0). Electronically Signed   By: Vanetta Chou M.D.   On: 11/05/2023 19:46   ECHOCARDIOGRAM COMPLETE Result Date: 11/05/2023    ECHOCARDIOGRAM REPORT   Patient Name:   Susan [name] Date of Exam: 11/05/2023 Medical Rec #:  [id number]    Height:       61.0 in Accession #:    [id number]   Weight:       200.0 lb Date of Birth:  Aug 27, 1940    BSA:          1.889 m Patient Age:    83 years     BP:           142/96 mmHg Patient Gender: F            HR:           68 bpm. Exam Location:  Inpatient Procedure: 2D Echo (Both Spectral and Color Flow Doppler were utilized during             procedure). Indications:    Dyspnea  History:        Patient has prior history of Echocardiogram examinations.                 Signs/Symptoms:Dyspnea.  Sonographer:    Norleen Amour Referring Phys: 40 ARSHAD N KAKRAKANDY IMPRESSIONS  1. Left ventricular ejection fraction, by estimation, is 45 to 50%. Left ventricular ejection fraction by 2D MOD biplane is 50.0 %. The left ventricle has mildly decreased function. The left ventricle demonstrates global hypokinesis. There is mild left ventricular hypertrophy. Left ventricular diastolic parameters  are consistent with Grade I diastolic dysfunction (impaired relaxation).  2. Right ventricular systolic function is normal. The right ventricular size is normal. There is normal pulmonary artery systolic pressure. The estimated right ventricular systolic pressure is 28.4 mmHg.  3. Left atrial size was moderately dilated.  4. Cannot exclude left to right shunt by color doppler.. Possible atrial level shunt.  5. Right atrial size was mildly dilated.  6. The mitral valve is grossly normal. Mild mitral valve regurgitation.  7. The aortic valve is tricuspid. Aortic valve regurgitation is not visualized.  8. The inferior vena cava is normal in size with greater than 50% respiratory variability, suggesting right atrial pressure of 3 mmHg. Comparison(s): Changes from prior study are noted. 09/03/2019: LVEF 55-60%. Conclusion(s)/Recommendation(s): Cannot exclude ASD/PFO. Consider transesophageal echocardiogram, if clinically indicated. FINDINGS  Left Ventricle: Left ventricular ejection fraction, by estimation, is 45 to 50%. Left ventricular ejection fraction by 2D MOD biplane is 50.0 %. The left ventricle has mildly decreased function. The left ventricle demonstrates global hypokinesis. The left ventricular internal cavity size was normal in size. There is mild left ventricular hypertrophy. Left ventricular diastolic parameters are consistent with Grade I diastolic dysfunction  (impaired relaxation). Indeterminate filling pressures. Right Ventricle: The right ventricular size is normal. No increase in right ventricular wall thickness. Right ventricular systolic function is normal. There is normal pulmonary artery systolic pressure. The tricuspid regurgitant velocity is 2.52 m/s, and  with an assumed right atrial pressure of 3 mmHg, the estimated right ventricular systolic pressure is 28.4 mmHg. Left Atrium: Left atrial size was moderately dilated. Right Atrium: Right atrial size was mildly dilated. Pericardium: There is no evidence of pericardial effusion. Mitral Valve: The mitral valve is grossly normal. Mild mitral valve regurgitation. Tricuspid Valve: The tricuspid valve is grossly normal. Tricuspid valve regurgitation is trivial. Aortic Valve: The aortic valve is tricuspid. Aortic valve regurgitation is not visualized. Aortic valve mean gradient measures 5.0 mmHg. Aortic valve peak gradient measures 10.0 mmHg. Aortic valve area, by VTI measures 1.77 cm. Pulmonic Valve: The pulmonic valve was grossly normal. Pulmonic valve regurgitation is trivial. Aorta: The aortic root and ascending aorta are structurally normal, with no evidence of dilitation. Venous: The inferior vena cava is normal in size with greater than 50% respiratory variability, suggesting right atrial pressure of 3 mmHg. IAS/Shunts: There is right bowing of the interatrial septum, suggestive of elevated left atrial pressure. Possible atrial level shunt.  LEFT VENTRICLE PLAX 2D                        Biplane EF (MOD) LVIDd:         5.80 cm         LV Biplane EF:   Left LVIDs:         3.80 cm                          ventricular LV PW:         1.30 cm                          ejection LV IVS:        0.90 cm                          fraction by LVOT diam:     1.90 cm  2D MOD LV SV:         51                               biplane is LV SV Index:   27                               50.0 %. LVOT Area:      2.84 cm LV IVRT:       99 msec         Diastology                                LV e' medial:    5.61 cm/s                                LV E/e' medial:  15.3 LV Volumes (MOD)               LV e' lateral:   8.15 cm/s LV vol d, MOD    120.0 ml      LV E/e' lateral: 10.5 A2C: LV vol d, MOD    84.6 ml A4C: LV vol s, MOD    45.6 ml A2C: LV vol s, MOD    49.9 ml A4C: LV SV MOD A2C:   74.4 ml LV SV MOD A4C:   84.6 ml LV SV MOD BP:    51.4 ml RIGHT VENTRICLE             IVC RV Basal diam:  3.60 cm     IVC diam: 1.10 cm RV S prime:     14.70 cm/s TAPSE (M-mode): 3.0 cm      PULMONARY VEINS                             Diastolic Velocity: 42.10 cm/s                             S/D Velocity:       1.10                             Systolic Velocity:  47.20 cm/s LEFT ATRIUM             Index        RIGHT ATRIUM           Index LA diam:        3.80 cm 2.01 cm/m   RA Area:     17.90 cm LA Vol (A2C):   59.8 ml 31.66 ml/m  RA Volume:   50.90 ml  26.95 ml/m LA Vol (A4C):   93.8 ml 49.66 ml/m LA Biplane Vol: 75.5 ml 39.97 ml/m  AORTIC VALVE                     PULMONIC VALVE AV Area (Vmax):    1.77 cm      PV Vmax:          1.10 m/s AV Area (Vmean):   1.77 cm      PV Peak grad:     4.8 mmHg  AV Area (VTI):     1.77 cm      PR End Diast Vel: 9.00 msec AV Vmax:           158.00 cm/s AV Vmean:          103.000 cm/s AV VTI:            0.289 m AV Peak Grad:      10.0 mmHg AV Mean Grad:      5.0 mmHg LVOT Vmax:         98.80 cm/s LVOT Vmean:        64.400 cm/s LVOT VTI:          0.180 m LVOT/AV VTI ratio: 0.62  AORTA Ao Root diam: 2.80 cm Ao Asc diam:  3.30 cm MITRAL VALVE                  TRICUSPID VALVE MV Area (PHT): 2.66 cm       TR Peak grad:   25.4 mmHg MV Decel Time: 285 msec       TR Vmax:        252.00 cm/s MR Peak grad:    124.5 mmHg MR Mean grad:    82.0 mmHg    SHUNTS MR Vmax:         558.00 cm/s  Systemic VTI:  0.18 m MR Vmean:        428.0 cm/s   Systemic Diam: 1.90 cm MR PISA:         1.57 cm MR PISA Eff ROA: 9  mm MR PISA Radius:  0.50 cm MV E velocity: 85.70 cm/s MV A velocity: 120.00 cm/s MV E/A ratio:  0.71 Vinie Maxcy MD Electronically signed by Vinie Maxcy MD Signature Date/Time: 11/05/2023/3:15:36 PM    Final    CT CHEST WO CONTRAST Result Date: 11/04/2023 EXAM: CT CHEST WITHOUT CONTRAST 11/04/2023 09:31:36 PM TECHNIQUE: CT of the chest was performed without the administration of intravenous contrast. Multiplanar reformatted images are provided for review. Automated exposure control, iterative reconstruction, and/or weight based adjustment of the mA/kV was utilized to reduce the radiation dose to as low as reasonably achievable. COMPARISON: None available. CLINICAL HISTORY: Cough, chronic/persisting > 8 weeks, failed empiric treatment. Trauma, fall. FINDINGS: MEDIASTINUM: Heart is mildly enlarged. Descending thoracic aorta is mildly dilated measuring 3.3 cm. There are atherosclerotic calcifications of the aorta and coronary arteries. The central airways are clear. The thyroid  gland is diffusely heterogeneous and contains punctate calcifications. There is an inferior left thyroid  nodule measuring 13 mm. LYMPH NODES: No mediastinal, hilar or axillary lymphadenopathy. LUNGS AND PLEURA: There are atelectatic changes in the lung bases. There are some reticular opacities in the right middle lobe peripherally which are new from prior. There is a new 2 mm nodule in the right middle lobe image 4/82. There is a stable 4 mm right lower lobe nodule image 4/61. No focal consolidation or pulmonary edema. No pleural effusion or pneumothorax. SOFT TISSUES/BONES: Degenerative changes of the thoracic spine. No acute abnormality of the soft tissues. UPPER ABDOMEN: Cholecystectomy clips are present. Bilateral renal cysts are present. The largest is in the superior pole of the left kidney measuring 4.6 cm. Limited images of the upper abdomen demonstrates no acute abnormality. IMPRESSION: 1. New peripheral reticular opacities in  the right middle lobe, possibly infectious/inflammatory . 2. New 2 mm right middle lobe pulmonary nodule (image 4/82). Stable 4 mm right lower lobe pulmonary nodule (image 4/61). Per Fleischner Society Guidelines for multiple  solid nodules 05 mm without high-risk features, no routine follow-up imaging is recommended. 3. Mild cardiomegaly with atherosclerotic calcifications of the aorta and coronary arteries. 4. Mild dilation of the descending thoracic aorta measuring 3.3 cm. 5. Thyroid  is diffusely heterogeneous with punctate calcifications and a 13 mm inferior left thyroid  nodule. Per ACR thyroid  incidental nodule guidelines, recommend non-emergent thyroid  ultrasound given heterogeneous/enlarged thyroid  and punctate calcifications. Electronically signed by: Greig Pique MD 11/04/2023 09:54 PM EDT RP Workstation: [id number]   DG HIP UNILAT WITH PELVIS 2-3 VIEWS RIGHT Result Date: 11/04/2023 EXAM: 2 or 3 VIEW(S) XRAY OF THE RIGHT HIP 11/04/2023 01:14:00 PM COMPARISON: None available. CLINICAL HISTORY: [id number] Trauma [id number]. Fell today,pain right hip Clemens today,pain right hip FINDINGS: BONES AND JOINTS: No acute fracture or focal osseous lesion. Mild symmetric degenerative changes in the hips with joint space narrowing and spurring. SOFT TISSUES: The soft tissues are unremarkable. IMPRESSION: 1. No acute fracture or dislocation. Electronically signed by: Franky Crease MD 11/04/2023 01:59 PM EDT RP Workstation: [id number]   DG Chest Port 1 View Result Date: 11/04/2023 CLINICAL DATA:  Fall. EXAM: PORTABLE CHEST 1 VIEW COMPARISON:  Chest radiograph dated 09/22/2023. FINDINGS: There is cardiomegaly with vascular congestion. Faint interstitial densities may represent edema. Pneumonia is not excluded. No pleural effusion or pneumothorax. No acute osseous pathology. IMPRESSION: Cardiomegaly with vascular congestion and possible mild edema. Pneumonia is not excluded. Electronically Signed   By: Vanetta Chou M.D.    On: 11/04/2023 13:35   CT Cervical Spine Wo Contrast Result Date: 11/04/2023 EXAM: CT CERVICAL SPINE WITHOUT CONTRAST 11/04/2023 12:40:00 PM TECHNIQUE: CT of the cervical spine was performed without the administration of intravenous contrast. Multiplanar reformatted images are provided for review. Automated exposure control, iterative reconstruction, and/or weight based adjustment of the mA/kV was utilized to reduce the radiation dose to as low as reasonably achievable. COMPARISON: CT of the cervical spine dated 10/23/2022. CLINICAL HISTORY: FINDINGS: CERVICAL SPINE: BONES AND ALIGNMENT: No acute fracture or traumatic malalignment. There is a mild levoscoliotic curvature of the cervical spine again demonstrated. There is grade 1 anterolisthesis also again noted at C2-C3. There is widening of the atlantodental interval to 5 mm, as before. 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 There is fusion of the C3 and C4 vertebral bodies. There is reversal of the normal cervical lordosis. 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 SOFT TISSUES: No prevertebral soft tissue swelling. IMPRESSION: 1. No acute abnormality of the cervical spine related to the reported trauma. 2. Widening of the atlantodental interval to 5 mm. 3. Reversal of the normal cervical lordosis with chronic degenerative disc disease at C4-5, C5-6, and C6-7 resulting in mild-to-moderate central spinal canal stenosis and bilateral neural foraminal stenosis at each level. Electronically signed by: Evalene Coho MD 11/04/2023 01:00 PM EDT RP Workstation: [id number]   CT Head Wo Contrast Result Date: 11/04/2023 EXAM: CT HEAD WITHOUT CONTRAST 11/04/2023 12:40:00 PM TECHNIQUE: CT of the head was performed without the administration of intravenous contrast. Automated  exposure control, iterative reconstruction, and/or weight based adjustment of the mA/kV was utilized to reduce the radiation dose to as low as reasonably achievable. COMPARISON: CT of the head dated 02/27/2023. CLINICAL HISTORY: Head trauma, intracranial arterial injury suspected. Trauma; Fall - LEVEL 2; Patient fell out of bed, was reaching for her wheelchair when she fell out of her bed. She  fell forward and hit her concrete floor. The patient is experiencing R sided head, elbow, and hip pain. Witnessed fall, no LOC. On eliquis , hx stroke with R sided ; deficits, hz afib. AxOx4. On RA. FINDINGS: BRAIN AND VENTRICLES: No acute hemorrhage. No evidence of acute infarct. No hydrocephalus. No extra-axial collection. No mass effect or midline shift. There is age-related volume loss. There is mild-to-moderate periventricular white matter disease. There are calcifications within the carotid siphons. ORBITS: Patient is status post bilateral lens replacement. SINUSES: No acute abnormality. SOFT TISSUES AND SKULL: No acute soft tissue abnormality. No skull fracture. IMPRESSION: 1. No acute intracranial abnormality related to the reported head trauma. 2. Age-related volume loss and mild-to-moderate periventricular white matter disease. Electronically signed by: Evalene Coho MD 11/04/2023 12:53 PM EDT RP Workstation: [id number]   US  LIMITED ULTRASOUND INCLUDING AXILLA RIGHT BREAST Result Date: 10/24/2023 CLINICAL DATA:  83 year old patient reportedly with palpable lump in the lower right breast which she no longer feels. Per patient's daughters lump also felt by patient's PA at her nursing home [facility]. EXAM: Bilateral mammogram and ULTRASOUND RIGHT BREAST LIMITED TECHNIQUE: Targeted ultrasound examination of the right breast was performed COMPARISON:  Previous exam(s). ACR Breast Density Category b: There are scattered areas of fibroglandular density. FINDINGS: Bilateral mammogram: Patient is confined to a  wheelchair which limited positioning and visualization of the pectoralis muscle on the bilateral MLO views. No suspicious masses, microcalcifications or architectural distortion seen in either breast. No findings concerning for malignancy. There has been no significant interval change compared to prior mammograms. On physical exam, no abnormal mass or lump palpated in the right breast Targeted ultrasound is performed of the lower right breast at the reported area of concern felt by the patient. Only normal fibroglandular tissue is seen. No suspicious masses, distortion or other abnormalities seen on ultrasound. IMPRESSION: No mammographic evidence of malignancy in either breast. No sonographic abnormality seen in the right breast. RECOMMENDATION: Routine annual screening mammogram in 1 year. I have discussed the findings and recommendations with the patient. If applicable, a reminder letter will be sent to the patient regarding the next appointment. BI-RADS CATEGORY  1: Negative. Electronically Signed   By: Rosina Gelineau M.D.   On: 10/24/2023 14:09   MM 3D DIAGNOSTIC MAMMOGRAM BILATERAL BREAST Addendum Date: 10/24/2023 ADDENDUM REPORT: 10/24/2023 14:06 ADDENDUM: Right breast ultrasound also performed. Electronically Signed   By: Rosina Gelineau M.D.   On: 10/24/2023 14:06   Result Date: 10/24/2023 CLINICAL DATA:  83 year old patient reportedly with palpable lump in the lower right breast which she no longer feels. Per patient's daughters lump also felt by patient's PA at her nursing home [facility]. EXAM: DIGITAL DIAGNOSTIC BILATERAL MAMMOGRAM WITH TOMOSYNTHESIS AND CAD TECHNIQUE: Bilateral digital diagnostic mammography and breast tomosynthesis was performed. The images were evaluated with computer-aided detection. COMPARISON:  Previous exam(s). ACR Breast Density Category b: There are scattered areas of fibroglandular density. FINDINGS: Bilateral mammogram: Patient is confined to a wheelchair which  limited positioning and visualization of the pectoralis muscle on the bilateral MLO views. No suspicious masses, microcalcifications or architectural distortion seen in either breast. No findings concerning for malignancy. There has been no significant interval change compared to prior mammograms. On physical exam, no abnormal mass or lump palpated in the right breast Targeted ultrasound is performed of the lower right breast at the reported area of concern felt by the patient. Only normal fibroglandular tissue is seen. No suspicious masses, distortion or other abnormalities seen on ultrasound.  IMPRESSION: No mammographic evidence of malignancy in either breast. No sonographic abnormality seen in the right breast. RECOMMENDATION: Routine annual screening mammogram in 1 year. I have discussed the findings and recommendations with the patient. If applicable, a reminder letter will be sent to the patient regarding the next appointment. BI-RADS CATEGORY  1: Negative. Electronically Signed: By: Rosina Gelineau M.D. On: 10/24/2023 13:44    Microbiology: Results for orders placed or performed during the hospital encounter of 11/04/23  Resp panel by RT-PCR (RSV, Flu A&B, Covid) Anterior Nasal Swab     Status: None   Collection Time: 11/05/23  8:07 AM   Specimen: Anterior Nasal Swab  Result Value Ref Range Status   SARS Coronavirus 2 by RT PCR NEGATIVE NEGATIVE Final   Influenza A by PCR NEGATIVE NEGATIVE Final   Influenza B by PCR NEGATIVE NEGATIVE Final    Comment: (NOTE) The Xpert Xpress SARS-CoV-2/FLU/RSV plus assay is intended as an aid in the diagnosis of influenza from Nasopharyngeal swab specimens and should not be used as a sole basis for treatment. Nasal washings and aspirates are unacceptable for Xpert Xpress SARS-CoV-2/FLU/RSV testing.  Fact Sheet for Patients: bloggercourse.com  Fact Sheet for Healthcare Providers: seriousbroker.it  This  test is not yet approved or cleared by the [place]  FDA and has been authorized for detection and/or diagnosis of SARS-CoV-2 by FDA under an Emergency Use Authorization (EUA). This EUA will remain in effect (meaning this test can be used) for the duration of the COVID-19 declaration under Section 564(b)(1) of the Act, 21 U.S.C. section 360bbb-3(b)(1), unless the authorization is terminated or revoked.     Resp Syncytial Virus by PCR NEGATIVE NEGATIVE Final    Comment: (NOTE) Fact Sheet for Patients: bloggercourse.com  Fact Sheet for Healthcare Providers: seriousbroker.it  This test is not yet approved or cleared by the [place]  FDA and has been authorized for detection and/or diagnosis of SARS-CoV-2 by FDA under an Emergency Use Authorization (EUA). This EUA will remain in effect (meaning this test can be used) for the duration of the COVID-19 declaration under Section 564(b)(1) of the Act, 21 U.S.C. section 360bbb-3(b)(1), unless the authorization is terminated or revoked.  Performed at [facility], [address] [place], [place] [address]   C Difficile Quick Screen w PCR reflex     Status: Abnormal   Collection Time: 11/06/23  3:50 AM   Specimen: STOOL  Result Value Ref Range Status   C Diff antigen POSITIVE (A) NEGATIVE Final   C Diff toxin NEGATIVE NEGATIVE Final   C Diff interpretation Results are indeterminate. See PCR results.  Final    Comment: Performed at [facility], [address], [place], [place] [address]  C. Diff by PCR, Reflexed     Status: None   Collection Time: 11/06/23  3:50 AM  Result Value Ref Range Status   Toxigenic C. Difficile by PCR NEGATIVE NEGATIVE Final    Comment: Patient is colonized with non toxigenic C. difficile. May not need treatment unless significant symptoms are present.   Hypervirulent Strain PRESUMPTIVE NEGATIVE PRESUMPTIVE NEGATIVE Final    Comment:  Performed at [facility], [address], [place], [place] [address]   Labs: CBC: Recent Labs  Lab 11/04/23 1512 11/05/23 0242 11/06/23 0238 11/07/23 1003  WBC 4.3 4.8 4.9 4.3  NEUTROABS 2.5  --   --   --   HGB 11.0* 11.2* 10.0* 11.0*  HCT 34.1* 33.2* 30.3* 33.6*  MCV  98.0 95.7 95.6 97.4  PLT 273 273 241 221   Basic Metabolic Panel: Recent Labs  Lab 11/04/23 1512 11/05/23 0242 11/06/23 0238 11/06/23 1743 11/07/23 1003  NA 143 141 140 139 138  K 4.1 3.8 3.9 4.1 3.9  CL 113* 110 109 109 109  CO2 22 20* 19* 18* 19*  GLUCOSE 91 103* 101* 107* 169*  BUN 31* 33* 30* 28* 23  CREATININE 1.79* 1.76* 1.93* 2.03* 1.57*  CALCIUM  9.0 9.5 8.7* 9.0 8.9  MG  --   --   --   --  1.5*   Liver Function Tests: Recent Labs  Lab 11/04/23 1512  AST 24  ALT 17  ALKPHOS 74  BILITOT 0.6  PROT 6.4*  ALBUMIN 3.6   CBG: Recent Labs  Lab 11/04/23 1216  GLUCAP 71    Discharge time spent: greater than 30 minutes.  Author: Yetta Blanch, MD  [facility]

## 2023-11-09 LAB — UREA NITROGEN, URINE: Urea Nitrogen, Ur: 442 mg/dL

## 2023-11-30 ENCOUNTER — Other Ambulatory Visit: Payer: Self-pay

## 2023-11-30 ENCOUNTER — Emergency Department (HOSPITAL_COMMUNITY)

## 2023-11-30 ENCOUNTER — Emergency Department (HOSPITAL_COMMUNITY)
Admission: EM | Admit: 2023-11-30 | Discharge: 2023-12-01 | Disposition: A | Discharge status: Home / Self Care | Attending: Emergency Medicine | Admitting: Emergency Medicine

## 2023-11-30 DIAGNOSIS — D72819 Decreased white blood cell count, unspecified: Secondary | ICD-10-CM | POA: Diagnosis not present

## 2023-11-30 DIAGNOSIS — I130 Hypertensive heart and chronic kidney disease with heart failure and stage 1 through stage 4 chronic kidney disease, or unspecified chronic kidney disease: Secondary | ICD-10-CM | POA: Diagnosis not present

## 2023-11-30 DIAGNOSIS — R059 Cough, unspecified: Secondary | ICD-10-CM | POA: Diagnosis present

## 2023-11-30 DIAGNOSIS — F0390 Unspecified dementia without behavioral disturbance: Secondary | ICD-10-CM | POA: Insufficient documentation

## 2023-11-30 DIAGNOSIS — Z91040 Latex allergy status: Secondary | ICD-10-CM | POA: Diagnosis not present

## 2023-11-30 DIAGNOSIS — J40 Bronchitis, not specified as acute or chronic: Secondary | ICD-10-CM | POA: Diagnosis not present

## 2023-11-30 DIAGNOSIS — R0602 Shortness of breath: Secondary | ICD-10-CM | POA: Diagnosis not present

## 2023-11-30 DIAGNOSIS — N189 Chronic kidney disease, unspecified: Secondary | ICD-10-CM | POA: Insufficient documentation

## 2023-11-30 DIAGNOSIS — Z7901 Long term (current) use of anticoagulants: Secondary | ICD-10-CM | POA: Insufficient documentation

## 2023-11-30 DIAGNOSIS — I509 Heart failure, unspecified: Secondary | ICD-10-CM | POA: Insufficient documentation

## 2023-11-30 LAB — RESP PANEL BY RT-PCR (RSV, FLU A&B, COVID)  RVPGX2
Influenza A by PCR: NEGATIVE
Influenza B by PCR: NEGATIVE
Resp Syncytial Virus by PCR: NEGATIVE
SARS Coronavirus 2 by RT PCR: NEGATIVE

## 2023-11-30 LAB — CBC WITH DIFFERENTIAL/PLATELET
Abs Immature Granulocytes: 0.01 K/uL (ref 0.00–0.07)
Basophils Absolute: 0 K/uL (ref 0.0–0.1)
Basophils Relative: 1 %
Eosinophils Absolute: 0.2 K/uL (ref 0.0–0.5)
Eosinophils Relative: 7 %
HCT: 33.3 % — ABNORMAL LOW (ref 36.0–46.0)
Hemoglobin: 10.8 g/dL — ABNORMAL LOW (ref 12.0–15.0)
Immature Granulocytes: 0 %
Lymphocytes Relative: 27 %
Lymphs Abs: 0.9 K/uL (ref 0.7–4.0)
MCH: 31.9 pg (ref 26.0–34.0)
MCHC: 32.4 g/dL (ref 30.0–36.0)
MCV: 98.2 fL (ref 80.0–100.0)
Monocytes Absolute: 0.5 K/uL (ref 0.1–1.0)
Monocytes Relative: 13 %
Neutro Abs: 1.8 K/uL (ref 1.7–7.7)
Neutrophils Relative %: 52 %
Platelets: 255 K/uL (ref 150–400)
RBC: 3.39 MIL/uL — ABNORMAL LOW (ref 3.87–5.11)
RDW: 13.8 % (ref 11.5–15.5)
WBC: 3.5 K/uL — ABNORMAL LOW (ref 4.0–10.5)
nRBC: 0 % (ref 0.0–0.2)

## 2023-11-30 LAB — COMPREHENSIVE METABOLIC PANEL WITH GFR
ALT: 19 U/L (ref 0–44)
AST: 27 U/L (ref 15–41)
Albumin: 3.5 g/dL (ref 3.5–5.0)
Alkaline Phosphatase: 72 U/L (ref 38–126)
Anion gap: 10 (ref 5–15)
BUN: 22 mg/dL (ref 8–23)
CO2: 21 mmol/L — ABNORMAL LOW (ref 22–32)
Calcium: 9.4 mg/dL (ref 8.9–10.3)
Chloride: 110 mmol/L (ref 98–111)
Creatinine, Ser: 1.65 mg/dL — ABNORMAL HIGH (ref 0.44–1.00)
GFR, Estimated: 30 mL/min — ABNORMAL LOW (ref >60)
Glucose, Bld: 75 mg/dL (ref 70–99)
Potassium: 4.5 mmol/L (ref 3.5–5.1)
Sodium: 141 mmol/L (ref 135–145)
Total Bilirubin: 0.5 mg/dL (ref 0.0–1.2)
Total Protein: 6.8 g/dL (ref 6.5–8.1)

## 2023-11-30 LAB — BRAIN NATRIURETIC PEPTIDE: B Natriuretic Peptide: 181.8 pg/mL — ABNORMAL HIGH (ref 0.0–100.0)

## 2023-11-30 LAB — MAGNESIUM: Magnesium: 1.9 mg/dL (ref 1.7–2.4)

## 2023-11-30 MED ORDER — BENZONATATE 200 MG PO CAPS: 200.0000 mg | ORAL_CAPSULE | Freq: Three times a day (TID) | ORAL | 0 refills | Status: AC | PRN

## 2023-11-30 MED ORDER — IPRATROPIUM-ALBUTEROL 0.5-2.5 (3) MG/3ML IN SOLN
3.0000 mL | Freq: Once | RESPIRATORY_TRACT | Status: AC
  Administered 2023-11-30: 3 mL via RESPIRATORY_TRACT
  Filled 2023-11-30: qty 3

## 2023-11-30 MED ORDER — PREDNISONE 20 MG PO TABS
60.0000 mg | ORAL_TABLET | Freq: Once | ORAL | Status: AC
  Administered 2023-11-30: 60 mg via ORAL
  Filled 2023-11-30: qty 3

## 2023-11-30 MED ORDER — CARVEDILOL 3.125 MG PO TABS
3.1250 mg | ORAL_TABLET | Freq: Two times a day (BID) | ORAL | Status: DC
  Administered 2023-11-30: 3.125 mg via ORAL
  Filled 2023-11-30: qty 1

## 2023-11-30 MED ORDER — ALBUTEROL SULFATE HFA 108 (90 BASE) MCG/ACT IN AERS
2.0000 | INHALATION_SPRAY | Freq: Once | RESPIRATORY_TRACT | Status: AC
  Administered 2023-11-30: 2 via RESPIRATORY_TRACT
  Filled 2023-11-30: qty 6.7

## 2023-11-30 MED ORDER — HYDRALAZINE HCL 25 MG PO TABS
25.0000 mg | ORAL_TABLET | Freq: Three times a day (TID) | ORAL | Status: DC
  Administered 2023-11-30 (×2): 25 mg via ORAL
  Filled 2023-11-30 (×2): qty 1

## 2023-11-30 MED ORDER — PREDNISONE 10 MG (21) PO TBPK: ORAL_TABLET | Freq: Every day | ORAL | 0 refills | Status: DC

## 2023-11-30 MED ORDER — ALBUTEROL SULFATE HFA 108 (90 BASE) MCG/ACT IN AERS
INHALATION_SPRAY | RESPIRATORY_TRACT | Status: AC
  Filled 2023-11-30: qty 6.7

## 2023-11-30 MED ORDER — FUROSEMIDE 40 MG PO TABS: 40.0000 mg | ORAL_TABLET | Freq: Every day | ORAL | 0 refills | Status: AC

## 2023-11-30 MED ORDER — AMLODIPINE BESYLATE 5 MG PO TABS
5.0000 mg | ORAL_TABLET | Freq: Every day | ORAL | Status: DC
  Administered 2023-11-30: 5 mg via ORAL
  Filled 2023-11-30: qty 1

## 2023-11-30 NOTE — ED Notes (Signed)
 [facility] called

## 2023-11-30 NOTE — ED Notes (Signed)
 Attempted to call report to [facility] with no success x2.

## 2023-11-30 NOTE — Discharge Instructions (Addendum)
 Your test results today were overall reassuring.  We did not identify a pneumonia.  I do not think that you need antibiotics at this time.  Importantly, your blood oxygenation was 99 to 100% on room air while you were here in the emergency department.  Ongoing cough is likely related to your COPD and possibly a postinfectious bronchitis.  Prescriptions are attached: -Take prednisone  in tapering dose to treat bronchitis - Take Lasix  daily for the next 7 days to optimize lung function.  Talk to your primary care doctor about long-term use. -Benzonatate  is a cough suppressant.  Take this only as needed.  You should get scheduled breathing treatments every 4 hours for the next 2 to 3 days.  Return to the emergency department for any new or worsening symptoms of concern.

## 2023-11-30 NOTE — ED Notes (Signed)
 Pt given water , okay'd per MD.

## 2023-11-30 NOTE — ED Notes (Signed)
 Attempted to call report to [facility] with no answer.

## 2023-11-30 NOTE — ED Notes (Signed)
 Pt granddaughter requesting treatment plan for pt at this time. Pt's granddaughter told that pt is still in EMS triage and that workup has just started and ED tech could get a provider to bedside to answer questions once pt has made it to a treatment room. EMS triage RN aware.

## 2023-11-30 NOTE — ED Notes (Signed)
 PT warm to touch.  Rectal temp 98.9.  Dry diaper present.

## 2023-11-30 NOTE — ED Notes (Signed)
 Pt reporting feeling short of breath. Pt placed on continuous O2 monitoring. Pt currently sating at 97% room air. Pt not showing any distress. EMS triage RN aware.

## 2023-11-30 NOTE — ED Notes (Addendum)
 Pt's daughter presented to ED lobby to see pt. She was advised the pt was in EMS triage and currently could not have any visitors. Daughter stated that pt already had grandaughter who is a engineer, civil (consulting) with [facility] at bedside. She became agitated that we would not let her back despite having been explained that she is not in a physical room yet for treatment and is not allowed visitors at this time. I went to assess pt who had triage RN already at bedside. Pt alert and oriented x4 at that time, despite granddaughter stating that pt has hx of dementia. Triage RN states pt has been appropriate and able to answer all questions appropriately. Again advised family and pt that her visitor would be allowed back once she got into a treatment area, but multiple visitors are not appropriate for EMS triage in hallway areas (H29 currently). Granddaughter stating intent to use safety zone portal to report incident of not being allowed full visitation at current time.

## 2023-11-30 NOTE — ED Triage Notes (Signed)
 Pt here via [facility] from [facility] where they recorded an O2 sat of 89%.  PT c/o increasing cough and sob vs her chronic cough.  Per staff, pt weaker than normal - normal to ambulate.   89% on 2L Rr 16 ETCO2 47  Upon arrival, sats 95 % on RA.  Rhonci noted on auscultation.  1+ edema bil.

## 2023-11-30 NOTE — ED Notes (Signed)
 Attempted to call [facility] with no success x3.

## 2023-11-30 NOTE — ED Provider Notes (Signed)
 [place] EMERGENCY DEPARTMENT AT [facility] Provider Note   CSN: [id number] Arrival date & time: 11/30/23  1538     Patient presents with: Shortness of Breath   Susan [name] is a 83 y.o. female.    Shortness of Breath Associated symptoms: cough   Patient presents for shortness of breath.  Medical history includes CHF, CKD, HTN, OSA, anxiety, depression, GERD, HLD, dementia.  Home medications include amlodipine , Coreg , Eliquis , hydralazine . at facility, they noted SpO2 of 89% on room air.  She has had recent increased cough and what she describes as mild shortness of breath.  Cough seems to worsen at times when she eats and drinks.  She has had some mild generalized weakness.  She is ambulatory at baseline.  She resides in a facility due to frequent falls.  Currently, she denies any areas of discomfort.     Prior to Admission medications   Medication Sig Start Date End Date Taking? Authorizing Provider  ADVAIR DISKUS 100-50 MCG/ACT AEPB Inhale 1 puff into the lungs 2 (two) times daily. 10/16/23   [provider]  albuterol  (VENTOLIN  HFA) 108 (90 Base) MCG/ACT inhaler Inhale 1-2 puffs into the lungs every 6 (six) hours as needed for wheezing or shortness of breath.    [provider]  amLODipine  (NORVASC ) 5 MG tablet Take 1 tablet (5 mg total) by mouth daily. Patient taking differently: Take 2.5 mg by mouth daily. 03/12/23   Drusilla Sabas RAMAN, MD  ARIPiprazole  (ABILIFY ) 10 MG tablet Take 10 mg by mouth every evening. Patient not taking: Reported on 11/05/2023    [provider]  benzonatate  (TESSALON ) 200 MG capsule Take 1 capsule (200 mg total) by mouth 3 (three) times daily as needed for cough. 11/30/23   Melvenia Motto, MD  buPROPion  (WELLBUTRIN  XL) 150 MG 24 hr tablet Take 150 mg by mouth daily. 10/14/22   [provider]  busPIRone  (BUSPAR ) 15 MG tablet Take 15 mg by mouth 2 (two) times daily.    [provider]  Calcium   Carb-Cholecalciferol  (CALCIUM  600+D3) 600-10 MG-MCG TABS Take 1 tablet by mouth daily in the afternoon.    [provider]  carvedilol  (COREG ) 3.125 MG tablet Take 3.125 mg by mouth 2 (two) times daily. 10/27/23   [provider]  cetirizine (ZYRTEC) 10 MG tablet Take 10 mg by mouth daily. 06/20/21   [provider]  dextromethorphan -guaiFENesin  (MUCINEX  DM) 30-600 MG 12hr tablet Take 1 tablet by mouth 2 (two) times daily.    [provider]  diclofenac  Sodium (VOLTAREN ) 1 % GEL Apply 4 g topically 4 (four) times daily. 09/22/23   Emil Share, DO  donepezil  (ARICEPT ) 10 MG tablet Take 1 tablet (10 mg total) by mouth at bedtime. For memory loss 05/25/18   Caro Harlene POUR, NP  ELIQUIS  2.5 MG TABS tablet TAKE 1 TABLET BY MOUTH 2 TIMES DAILY 01/04/22   Lelon Hamilton T, PA-C  FEROSUL 325 (65 Fe) MG tablet Take 325 mg by mouth 2 (two) times daily. 06/21/21   [provider]  furosemide  (LASIX ) 40 MG tablet Take 1 tablet (40 mg total) by mouth daily for 7 days. 11/30/23 12/07/23 Yes Melvenia Motto, MD  gabapentin  (NEURONTIN ) 100 MG capsule Take 200 mg by mouth 3 (three) times daily.    [provider]  hydrALAZINE  (APRESOLINE ) 25 MG tablet TAKE 1 TABLET BY MOUTH 3 TIMES DAILY Patient taking differently: Take 25 mg by mouth 3 (three) times daily. 06/17/18   Caro,  Jessica K, NP  Menthol , Topical Analgesic, (BIOFREEZE ROLL-ON) 4 % GEL Apply 1 application  topically in the morning and at bedtime. Apply to left and right shoulder for pain    [provider]  Multiple Vitamin (GNP ESSENTIAL ONE DAILY) TABS TAKE 1 TABLET BY MOUTH EVERY DAY Patient taking differently: Take 1 tablet by mouth daily after breakfast. 06/17/18   Eubanks, Jessica K, NP  Nutritional Supplements (ENSURE HIGH PROTEIN) LIQD Take 237 mLs by mouth daily.    [provider]  omeprazole  (PRILOSEC) 40 MG capsule Take 40 mg by mouth daily.    [provider]   polyethylene glycol (MIRALAX  / GLYCOLAX ) 17 g packet Take 17 g by mouth daily as needed for mild constipation. 03/11/23   Drusilla Sabas RAMAN, MD  pravastatin  (PRAVACHOL ) 40 MG tablet Take 2 tablets (80 mg total) by mouth daily. 08/04/23   Rosemarie Eather RAMAN, MD  predniSONE  (STERAPRED UNI-PAK 21 TAB) 10 MG (21) TBPK tablet Take by mouth daily. Take 6 tabs by mouth daily  for 2 days, then 5 tabs for 2 days, then 4 tabs for 2 days, then 3 tabs for 2 days, 2 tabs for 2 days, then 1 tab by mouth daily for 2 days 11/30/23  Yes Melvenia Motto, MD  saccharomyces boulardii (FLORASTOR) 250 MG capsule Take 1 capsule (250 mg total) by mouth 2 (two) times daily. 03/11/23   Drusilla Sabas RAMAN, MD  sertraline  (ZOLOFT ) 100 MG tablet Take 100 mg by mouth daily.    [provider]  TYLENOL  500 MG tablet Take 1,000 mg by mouth in the morning, at noon, and at bedtime.    [provider]    Allergies: Ace inhibitors, Lipitor [atorvastatin], Simvastatin, Abilify  [aripiprazole ], and Latex    Review of Systems  Respiratory:  Positive for cough and shortness of breath.   Neurological:  Positive for weakness (Generalized).  All other systems reviewed and are negative.   Updated Vital Signs BP (!) 168/88   Pulse 62   Temp 98.3 F (36.8 C) (Oral)   Resp 18   Ht 5' 1 (1.549 m)   Wt 90.7 kg   SpO2 100%   BMI 37.78 kg/m   Physical Exam Vitals and nursing note reviewed.  Constitutional:      General: She is not in acute distress.    Appearance: She is well-developed. She is not ill-appearing, toxic-appearing or diaphoretic.  HENT:     Head: Normocephalic and atraumatic.     Mouth/Throat:     Mouth: Mucous membranes are moist.  Eyes:     Conjunctiva/sclera: Conjunctivae normal.  Cardiovascular:     Rate and Rhythm: Normal rate and regular rhythm.  Pulmonary:     Effort: Pulmonary effort is normal. No respiratory distress.     Breath sounds: Normal breath sounds. No decreased breath sounds, wheezing or  rhonchi.  Chest:     Chest wall: No tenderness.  Abdominal:     Palpations: Abdomen is soft.     Tenderness: There is no abdominal tenderness.  Musculoskeletal:        General: No swelling. Normal range of motion.     Cervical back: Normal range of motion and neck supple.  Skin:    General: Skin is warm and dry.     Coloration: Skin is not cyanotic or pale.  Neurological:     General: No focal deficit present.     Mental Status: She is alert and oriented to person, place, and  time.  Psychiatric:        Mood and Affect: Mood normal.        Behavior: Behavior normal.     (all labs ordered are listed, but only abnormal results are displayed) Labs Reviewed  COMPREHENSIVE METABOLIC PANEL WITH GFR - Abnormal; Notable for the following components:      Result Value   CO2 21 (*)    Creatinine, Ser 1.65 (*)    GFR, Estimated 30 (*)    All other components within normal limits  CBC WITH DIFFERENTIAL/PLATELET - Abnormal; Notable for the following components:   WBC 3.5 (*)    RBC 3.39 (*)    Hemoglobin 10.8 (*)    HCT 33.3 (*)    All other components within normal limits  BRAIN NATRIURETIC PEPTIDE - Abnormal; Notable for the following components:   B Natriuretic Peptide 181.8 (*)    All other components within normal limits  RESP PANEL BY RT-PCR (RSV, FLU A&B, COVID)  RVPGX2  MAGNESIUM     EKG: EKG Interpretation Date/Time:  Sunday November 30 2023 15:52:33 EST Ventricular Rate:  64 PR Interval:  200 QRS Duration:  156 QT Interval:  456 QTC Calculation: 470 R Axis:   -63  Text Interpretation: Sinus rhythm with Premature atrial complexes Right bundle branch block Left anterior fascicular block Bifascicular block Minimal voltage criteria for LVH, may be normal variant ( R in aVL ) Septal infarct , age undetermined Abnormal ECG Confirmed by Melvenia Motto [phone]) on 11/30/2023 8:04:31 PM  Radiology: CT Chest Wo Contrast Result Date: 11/30/2023 CLINICAL DATA:  Respiratory illness,  nondiagnostic xray EXAM: CT CHEST WITHOUT CONTRAST TECHNIQUE: Multidetector CT imaging of the chest was performed following the standard protocol without IV contrast. RADIATION DOSE REDUCTION: This exam was performed according to the departmental dose-optimization program which includes automated exposure control, adjustment of the mA and/or kV according to patient size and/or use of iterative reconstruction technique. COMPARISON:  11/30/2023, 11/04/2023 FINDINGS: Cardiovascular: Unenhanced imaging of the heart demonstrates cardiomegaly without pericardial effusion. Stable ectasia of the thoracic aorta. Atherosclerosis of the aorta and coronary vasculature. Assessment of the vascular lumen cannot be performed without intravenous contrast. Mediastinum/Nodes: Stable appearance of the thyroid , trachea, and esophagus. No pathologic adenopathy. Lungs/Pleura: Scattered areas of atelectasis or scarring at the lung bases are stable. No acute airspace disease, effusion, or pneumothorax. The central airways are patent. Stable 4 mm right lower lobe pulmonary nodule reference image 66/4. No specific imaging follow-up recommended based on Fleischner criteria. Upper Abdomen: No acute abnormality. Musculoskeletal: No acute or destructive bony abnormalities. Reconstructed images demonstrate no additional findings. IMPRESSION: 1. Stable bibasilar scarring or atelectasis. No acute airspace disease. 2. Stable cardiomegaly. 3. Aortic Atherosclerosis (ICD10-I70.0). Coronary artery atherosclerosis. Electronically Signed   By: Ozell Daring M.D.   On: 11/30/2023 18:52   DG Chest Port 1 View Result Date: 11/30/2023 CLINICAL DATA:  Shortness of breath EXAM: PORTABLE CHEST 1 VIEW COMPARISON:  Chest radiograph dated 11/04/2023. FINDINGS: No focal consolidation, pleural effusion or pneumothorax. Mild cardiomegaly. No acute osseous pathology. IMPRESSION: 1. No active disease. 2. Mild cardiomegaly. Electronically Signed   By: Vanetta Chou M.D.   On: 11/30/2023 18:09     Procedures   Medications Ordered in the ED  amLODipine  (NORVASC ) tablet 5 mg (5 mg Oral Given 11/30/23 1851)  carvedilol  (COREG ) tablet 3.125 mg (3.125 mg Oral Given 11/30/23 1850)  hydrALAZINE  (APRESOLINE ) tablet 25 mg (25 mg Oral Given 11/30/23 1851)  predniSONE  (DELTASONE ) tablet 60 mg (  has no administration in time range)  ipratropium-albuterol  (DUONEB) 0.5-2.5 (3) MG/3ML nebulizer solution 3 mL (has no administration in time range)                                    Medical Decision Making Amount and/or Complexity of Data Reviewed Labs: ordered. Radiology: ordered.  Risk Prescription drug management.   This patient presents to the ED for concern of cough and shortness of breath, this involves an extensive number of treatment options, and is a complaint that carries with it a high risk of complications and morbidity.  The differential diagnosis includes reactive airway disease exacerbation, CHF, pneumonia, bronchitis, URI   Co morbidities / Chronic conditions that complicate the patient evaluation  CHF, CKD, HTN, OSA, anxiety, depression, GERD, HLD, dementia   Additional history obtained:  Additional history obtained from EMR External records from outside source obtained and reviewed including patient's family   Lab Tests:  I Ordered, and personally interpreted labs.  The pertinent results include: Slight anemia, mild leukopenia, baseline creatinine, normal electrolytes, mild elevation in BNP   Imaging Studies ordered:  I ordered imaging studies including chest x-ray, CT chest I independently visualized and interpreted imaging which showed no acute findings I agree with the radiologist interpretation   Cardiac Monitoring: / EKG:  The patient was maintained on a cardiac monitor.  I personally viewed and interpreted the cardiac monitored which showed an underlying rhythm of: Sinus rhythm   Problem List / ED Course /  Critical interventions / Medication management  Patient presenting for recent cough and shortness of breath.  She was also found to have hypoxia at her facility earlier today.  Per chart review, she was admitted to the hospital 3 weeks ago.  She was treated for CHF and pneumonia.  She is not currently on a diuretic.  On arrival in the ED, patient has unlabored breathing.  SpO2 was in the high 90s on room air.  Her vital signs are notable for hypertension.  Home blood pressure medications were ordered.  Her lungs are clear to auscultation at this time.  She does reportedly have a history of asthma.  Her workup was initiated.  SpO2 remained normal on room air.  X-ray does not show any acute findings.  CT was ordered to further evaluate.  Although she does feel warm to the touch, rectal temperature was normal.  Her lab work shows mild elevation in BNP.  On reassessment, SpO2 remains at 100% on room air.  CT of chest did not show any evidence of pneumonia.  I had a shared decision-making discussion with patient and family.  Patient reports that she was given a short course of steroids after her hospitalization.  It seems that this was a time when her cough improved but has since worsened.  Plan will be to prescribe prednisone  taper to treat what is likely a postinfectious bronchitis.  Given that she is not on a diuretic currently, will prescribe 1 week of Lasix  to help with her breathing.  Patient does receive breathing treatments at her facility as needed.  This results in her not often getting them.  Will provide breathing treatment here in the ED.  Will provide instructions for scheduled breathing treatments on discharge paperwork.  Her blood pressure did improve.  She is stable for discharge. I ordered medication including amlodipine , hydralazine , carvedilol  for hypertension; prednisone  and DuoNeb for bronchitis Reevaluation of  the patient after these medicines showed that the patient improved I have reviewed  the patients home medicines and have made adjustments as needed  Social Determinants of Health:  Resides in nursing facility     Final diagnoses:  Bronchitis    ED Discharge Orders          Ordered    furosemide  (LASIX ) 40 MG tablet  Daily        11/30/23 2043    predniSONE  (STERAPRED UNI-PAK 21 TAB) 10 MG (21) TBPK tablet  Daily        11/30/23 2043    benzonatate  (TESSALON ) 200 MG capsule  3 times daily PRN        11/30/23 2043               Melvenia Motto, MD 11/30/23 2044

## 2023-11-30 NOTE — ED Notes (Signed)
 Pt cleaned, new brief and pads applied with warm blankets. Pt denies any other needs

## 2024-01-05 ENCOUNTER — Emergency Department (HOSPITAL_COMMUNITY)
Admission: EM | Admit: 2024-01-05 | Discharge: 2024-01-05 | Disposition: A | Discharge status: Home / Self Care | Source: Skilled Nursing Facility | Attending: Emergency Medicine | Admitting: Emergency Medicine

## 2024-01-05 ENCOUNTER — Emergency Department (HOSPITAL_COMMUNITY)

## 2024-01-05 ENCOUNTER — Encounter (HOSPITAL_COMMUNITY): Payer: Self-pay

## 2024-01-05 DIAGNOSIS — Z91040 Latex allergy status: Secondary | ICD-10-CM | POA: Diagnosis not present

## 2024-01-05 DIAGNOSIS — F0390 Unspecified dementia without behavioral disturbance: Secondary | ICD-10-CM | POA: Insufficient documentation

## 2024-01-05 DIAGNOSIS — Z8673 Personal history of transient ischemic attack (TIA), and cerebral infarction without residual deficits: Secondary | ICD-10-CM | POA: Insufficient documentation

## 2024-01-05 DIAGNOSIS — I5032 Chronic diastolic (congestive) heart failure: Secondary | ICD-10-CM | POA: Diagnosis not present

## 2024-01-05 DIAGNOSIS — Z7901 Long term (current) use of anticoagulants: Secondary | ICD-10-CM | POA: Insufficient documentation

## 2024-01-05 DIAGNOSIS — J449 Chronic obstructive pulmonary disease, unspecified: Secondary | ICD-10-CM | POA: Diagnosis not present

## 2024-01-05 DIAGNOSIS — Z79899 Other long term (current) drug therapy: Secondary | ICD-10-CM | POA: Diagnosis not present

## 2024-01-05 DIAGNOSIS — R0602 Shortness of breath: Secondary | ICD-10-CM | POA: Diagnosis not present

## 2024-01-05 DIAGNOSIS — J40 Bronchitis, not specified as acute or chronic: Secondary | ICD-10-CM | POA: Insufficient documentation

## 2024-01-05 DIAGNOSIS — I110 Hypertensive heart disease with heart failure: Secondary | ICD-10-CM | POA: Diagnosis not present

## 2024-01-05 DIAGNOSIS — R059 Cough, unspecified: Secondary | ICD-10-CM | POA: Diagnosis present

## 2024-01-05 LAB — RESP PANEL BY RT-PCR (RSV, FLU A&B, COVID)  RVPGX2
Influenza A by PCR: NEGATIVE
Influenza B by PCR: NEGATIVE
Resp Syncytial Virus by PCR: NEGATIVE
SARS Coronavirus 2 by RT PCR: NEGATIVE

## 2024-01-05 LAB — CBC
HCT: 37.9 % (ref 36.0–46.0)
Hemoglobin: 12.1 g/dL (ref 12.0–15.0)
MCH: 31.6 pg (ref 26.0–34.0)
MCHC: 31.9 g/dL (ref 30.0–36.0)
MCV: 99 fL (ref 80.0–100.0)
Platelets: 253 K/uL (ref 150–400)
RBC: 3.83 MIL/uL — ABNORMAL LOW (ref 3.87–5.11)
RDW: 13.2 % (ref 11.5–15.5)
WBC: 5.5 K/uL (ref 4.0–10.5)
nRBC: 0 % (ref 0.0–0.2)

## 2024-01-05 LAB — BASIC METABOLIC PANEL WITH GFR
Anion gap: 10 (ref 5–15)
BUN: 31 mg/dL — ABNORMAL HIGH (ref 8–23)
CO2: 22 mmol/L (ref 22–32)
Calcium: 10.5 mg/dL — ABNORMAL HIGH (ref 8.9–10.3)
Chloride: 113 mmol/L — ABNORMAL HIGH (ref 98–111)
Creatinine, Ser: 1.47 mg/dL — ABNORMAL HIGH (ref 0.44–1.00)
GFR, Estimated: 35 mL/min — ABNORMAL LOW
Glucose, Bld: 98 mg/dL (ref 70–99)
Potassium: 4.3 mmol/L (ref 3.5–5.1)
Sodium: 145 mmol/L (ref 135–145)

## 2024-01-05 MED ORDER — PREDNISONE 20 MG PO TABS
40.0000 mg | ORAL_TABLET | Freq: Once | ORAL | Status: AC
  Administered 2024-01-05: 40 mg via ORAL
  Filled 2024-01-05: qty 2

## 2024-01-05 MED ORDER — DOXYCYCLINE HYCLATE 100 MG PO TABS
100.0000 mg | ORAL_TABLET | Freq: Once | ORAL | Status: AC
  Administered 2024-01-05: 100 mg via ORAL
  Filled 2024-01-05: qty 1

## 2024-01-05 MED ORDER — PREDNISONE 10 MG PO TABS: 20.0000 mg | ORAL_TABLET | Freq: Every day | ORAL | 0 refills | Status: AC

## 2024-01-05 MED ORDER — DOXYCYCLINE HYCLATE 100 MG PO CAPS: 100.0000 mg | ORAL_CAPSULE | Freq: Two times a day (BID) | ORAL | 0 refills | Status: AC

## 2024-01-05 NOTE — ED Triage Notes (Signed)
 Pt arrived via EMS, from [facility]. Cough since October.

## 2024-01-05 NOTE — ED Notes (Signed)
 [facility] called for transport back to [facility]. JRPRN

## 2024-01-05 NOTE — ED Provider Notes (Signed)
 " [place] EMERGENCY DEPARTMENT AT [facility] Provider Note   CSN: [id number] Arrival date & time: 01/05/24  1331     Patient presents with: Cough   Susan [name] is a 83 y.o. female.    Cough Patient presents with cough.  Has had since around Thanksgiving.  Had done better but then got worse again.  Reportedly had some increased difficulty breathing at the nursing home.  May have had some hypoxia.  However upon arrival to ER not hypoxic.  Has had a cough some sputum production.  No fevers.  History of COPD per family.    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)    Family history of anesthesia complication    some wake up during OR; some are hard to wake up; some both (07/29/2012)   GERD (gastroesophageal reflux disease)    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 (HCC)    History of CVA (cerebrovascular accident) 02/01/2015   History of stomach ulcers 1980's   Hyperlipidemia    Hyperparathyroidism, primary 04/22/2012   Hypertensive heart disease    Incontinent of urine    wears pads   Major depressive disorder, recurrent episode, 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 Authorizing Provider  doxycycline   (VIBRAMYCIN ) 100 MG capsule Take 1 capsule (100 mg total) by mouth 2 (two) times daily. 01/05/24  Yes Patsey Lot, MD  predniSONE  (DELTASONE ) 10 MG tablet Take 2 tablets (20 mg total) by mouth daily for 5 days. 01/05/24 01/10/24 Yes Patsey Lot, MD  ADVAIR DISKUS 100-50 MCG/ACT AEPB Inhale 1 puff into the lungs 2 (two) times daily. 10/16/23   [provider]  albuterol  (VENTOLIN  HFA) 108 (90 Base) MCG/ACT inhaler Inhale 1-2 puffs into the lungs every 6 (six) hours as needed for wheezing or shortness of breath.    [provider]  amLODipine  (NORVASC ) 5 MG tablet Take 1 tablet (5 mg total) by mouth daily. Patient taking differently: Take 2.5 mg by mouth daily. 03/12/23   Drusilla Sabas RAMAN, MD  ARIPiprazole  (ABILIFY ) 10 MG tablet Take 10 mg by mouth every evening. Patient not taking: Reported on 11/05/2023    [provider]  benzonatate  (TESSALON ) 200 MG capsule Take 1 capsule (200 mg total) by mouth 3 (three) times daily as needed for cough. 11/30/23   Melvenia Motto, MD  buPROPion  (WELLBUTRIN  XL) 150 MG 24 hr tablet Take 150 mg by mouth daily. 10/14/22   [provider]  busPIRone  (BUSPAR ) 15 MG tablet Take 15 mg by mouth 2 (two) times daily.    [provider]  Calcium  Carb-Cholecalciferol  (CALCIUM  600+D3) 600-10  MG-MCG TABS Take 1 tablet by mouth daily in the afternoon.    [provider]  carvedilol  (COREG ) 3.125 MG tablet Take 3.125 mg by mouth 2 (two) times daily. 10/27/23   [provider]  cetirizine (ZYRTEC) 10 MG tablet Take 10 mg by mouth daily. 06/20/21   [provider]  dextromethorphan -guaiFENesin  (MUCINEX  DM) 30-600 MG 12hr tablet Take 1 tablet by mouth 2 (two) times daily.    [provider]  diclofenac  Sodium (VOLTAREN ) 1 % GEL Apply 4 g topically 4 (four) times daily. 09/22/23   Emil Share, DO  donepezil  (ARICEPT ) 10 MG tablet Take 1 tablet (10 mg total) by mouth at bedtime. For memory loss 05/25/18    Caro Harlene POUR, NP  ELIQUIS  2.5 MG TABS tablet TAKE 1 TABLET BY MOUTH 2 TIMES DAILY 01/04/22   Lelon Hamilton T, PA-C  FEROSUL 325 (65 Fe) MG tablet Take 325 mg by mouth 2 (two) times daily. 06/21/21   [provider]  furosemide  (LASIX ) 40 MG tablet Take 1 tablet (40 mg total) by mouth daily for 7 days. 11/30/23 12/07/23  Melvenia Motto, MD  gabapentin  (NEURONTIN ) 100 MG capsule Take 200 mg by mouth 3 (three) times daily.    [provider]  hydrALAZINE  (APRESOLINE ) 25 MG tablet TAKE 1 TABLET BY MOUTH 3 TIMES DAILY Patient taking differently: Take 25 mg by mouth 3 (three) times daily. 06/17/18   Eubanks, Jessica K, NP  Menthol , Topical Analgesic, (BIOFREEZE ROLL-ON) 4 % GEL Apply 1 application  topically in the morning and at bedtime. Apply to left and right shoulder for pain    [provider]  Multiple Vitamin (GNP ESSENTIAL ONE DAILY) TABS TAKE 1 TABLET BY MOUTH EVERY DAY Patient taking differently: Take 1 tablet by mouth daily after breakfast. 06/17/18   Eubanks, Jessica K, NP  Nutritional Supplements (ENSURE HIGH PROTEIN) LIQD Take 237 mLs by mouth daily.    [provider]  omeprazole  (PRILOSEC) 40 MG capsule Take 40 mg by mouth daily.    [provider]  polyethylene glycol (MIRALAX  / GLYCOLAX ) 17 g packet Take 17 g by mouth daily as needed for mild constipation. 03/11/23   Drusilla Sabas RAMAN, MD  pravastatin  (PRAVACHOL ) 40 MG tablet Take 2 tablets (80 mg total) by mouth daily. 08/04/23   Rosemarie Eather RAMAN, MD  saccharomyces boulardii (FLORASTOR) 250 MG capsule Take 1 capsule (250 mg total) by mouth 2 (two) times daily. 03/11/23   Drusilla Sabas RAMAN, MD  sertraline  (ZOLOFT ) 100 MG tablet Take 100 mg by mouth daily.    [provider]  TYLENOL  500 MG tablet Take 1,000 mg by mouth in the morning, at noon, and at bedtime.    [provider]    Allergies: Ace inhibitors, Lipitor [atorvastatin], Simvastatin, Abilify  [aripiprazole ], and Latex     Review of Systems  Respiratory:  Positive for cough.     Updated Vital Signs BP (!) 162/64 (BP Location: Left Arm)   Pulse 65   Temp 97.6 F (36.4 C) (Oral)   Resp 18   SpO2 97%   Physical Exam Vitals and nursing note reviewed.  Cardiovascular:     Rate and Rhythm: Regular rhythm.  Pulmonary:     Breath sounds: No wheezing or rhonchi.     Comments: Mildly harsh breath sounds focal rales rhonchi. Neurological:     Mental Status: She is alert.     (all labs ordered are listed, but only abnormal results are displayed) Labs Reviewed  BASIC METABOLIC PANEL WITH GFR - Abnormal; Notable for the following components:      Result Value   Chloride 113 (*)    BUN 31 (*)    Creatinine, Ser 1.47 (*)    Calcium  10.5 (*)    GFR, Estimated 35 (*)    All other components within normal limits  CBC - Abnormal; Notable for the following components:   RBC 3.83 (*)    All other components within normal limits  RESP PANEL BY RT-PCR (RSV, FLU A&B, COVID)  [id number]    EKG: None  Radiology: DG Chest 2 View Result Date: 01/05/2024 CLINICAL DATA:  Cough. EXAM: CHEST - 2 VIEW COMPARISON:  Radiograph and CT 11/30/2023 FINDINGS: Cardiomegaly is stable. Mediastinal contours are unchanged. Aortic atherosclerosis and tortuosity. No focal airspace disease, pulmonary edema, or pleural effusion. No pneumothorax. Mild bronchial thickening. On limited assessment, no acute osseous finding. IMPRESSION: 1. Mild bronchial thickening. 2. Stable cardiomegaly. Electronically Signed   By: Andrea Gasman M.D.   On: 01/05/2024 17:57     Procedures   Medications Ordered in the ED  predniSONE  (DELTASONE ) tablet 40 mg (40 mg Oral Given 01/05/24 2046)  doxycycline  (VIBRA -TABS) tablet 100 mg (100 mg Oral Given 01/05/24 2046)                                    Medical Decision Making Risk Prescription drug management.   Patient with shortness of breath and cough.  Somewhat chronic at this time.  X-ray  shows bronchitis.  No pneumonia seen.  Not hypoxic.  However with underlying COPD worsening cough with sputum production will treat with steroids and antibiotics.  Despite the vital signs showing a pulse ox of 7% this was not accurate.  On recheck it was 97%.  Appears stable for discharge home.      Final diagnoses:  Bronchitis    ED Discharge Orders          Ordered    doxycycline  (VIBRAMYCIN ) 100 MG capsule  2 times daily        01/05/24 2111    predniSONE  (DELTASONE ) 10 MG tablet  Daily        01/05/24 2111               Patsey Lot, MD 01/05/24 2116  "

## 2024-01-19 NOTE — Progress Notes (Incomplete)
 "  New Patient Pulmonology Office Visit   Subjective:  Patient ID: Susan [name], female    DOB: February 19, 1940  MRN: [id number]  Referred by: Venson Carrier, MD  CC: No chief complaint on file.   HPI Susan [name] is a 84 y.o. 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)    Family history of anesthesia complication    some wake up during OR; some are hard to wake up; some both (07/29/2012)   GERD (gastroesophageal reflux disease)    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 (HCC)    History of CVA (cerebrovascular accident) 02/01/2015   History of stomach ulcers 1980's   Hyperlipidemia    Hyperparathyroidism, primary 04/22/2012   Hypertensive heart disease    Incontinent of urine    wears pads   Major depressive disorder, recurrent episode, 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   OSA on CPAP    Osteoarthritis of right knee    Pedal edema    Persistent atrial fibrillation (HCC)    CHADS2VASC score is 7 - on chronic anticoagulation with Apixaban    Spinal stenosis    Umbilical hernia    unrepaired (07/29/2012)   Varicose veins    BLE (07/29/2012)   Vascular dementia (HCC)    Venous stasis dermatitis of left lower extremity 04/29/2018   Past Surgical History:  Procedure Laterality Date   CATARACT EXTRACTION     COLONOSCOPY     ENDOVENOUS ABLATION SAPHENOUS VEIN W/ LASER Left 06/24/2019   endovenous laser  ablation left greater saphenous vein by Medford Blade MD    IUD REMOVAL  1980's   PARATHYROIDECTOMY N/A 06/16/2012   Procedure: NECK EXPLORATION AND LEFT SUPERIOR PARATHYROIDECTOMY;  Surgeon: Krystal CHRISTELLA Spinner, MD;  Location: [facility];  Service: General;  Laterality: N/A;   TOTAL KNEE ARTHROPLASTY Right 07/29/2012   TOTAL KNEE ARTHROPLASTY Right 07/29/2012   Procedure: TOTAL KNEE ARTHROPLASTY;  Surgeon: Toribio JULIANNA Chancy, MD;  Location: [facility] OR;  Service: Orthopedics;  Laterality: Right;   TOTAL KNEE ARTHROPLASTY Left 12/13/2019   Procedure: LEFT TOTAL KNEE ARTHROPLASTY;  Surgeon: Jerri Kay CHRISTELLA, MD;  Location: [facility] OR;  Service: Orthopedics;  Laterality: Left;   Family History  Problem Relation Age of Onset   Hypertension Mother        deceased   Stroke Mother    Breast cancer Mother    Lung cancer Father        deceased   Diabetes Daughter    Hypertension Daughter    Social History   Socioeconomic History   Marital status: Widowed    Spouse name: Not on file   Number of children: Not on file   Years of education: Not on file   Highest education level: Not on file  Occupational History   Occupation: retired    Associate Professor: RETIRED  Tobacco Use   Smoking status: Former    Current packs/day: 0.00  Average packs/day: 1 pack/day for 30.0 years (30.0 ttl pk-yrs)    Types: Cigarettes    Start date: 01/08/1948    Quit date: 01/07/1978    Years since quitting: 46.0   Smokeless tobacco: Never  Vaping Use   Vaping status: Never Used  Substance and Sexual Activity   Alcohol use: No   Drug use: No   Sexual activity: Not Currently    Comment: intercourse age 3,less than 5 secxual partners,  Other Topics Concern   Not on file  Social History Narrative   Lives at SNF    Retired    Right-handed   Caffeine: 1 cup of coffee per day         Diet:      Caffeine:      Married, if yes what year:       Do you live in a house, apartment, assisted living, condo, trailer, ect:      Pets:       Current/Past profession:      Exercise:         Living Will: Yes   DNR: No   POA/HPOA: Yes      Functional Status:   Do you have difficulty bathing or dressing yourself? Yes   Do you have difficulty preparing food or eating? No   Do you have difficulty managing your medications? Yes   Do you have difficulty managing your finances? Yes   Do you have difficulty affording your medications? Yes   Social Drivers of Health   Tobacco Use: Medium Risk (01/05/2024)   Patient History    Smoking Tobacco Use: Former    Smokeless Tobacco Use: Never    Passive Exposure: Not on Actuary Strain: Not on file  Food Insecurity: No Food Insecurity (11/04/2023)   Epic    Worried About Programme Researcher, Broadcasting/film/video in the Last Year: Never true    Ran Out of Food in the Last Year: Never true  Transportation Needs: No Transportation Needs (11/04/2023)   Epic    Lack of Transportation (Medical): No    Lack of Transportation (Non-Medical): No  Physical Activity: Not on file  Stress: Not on file  Social Connections: Moderately Integrated (11/04/2023)   Social Connection and Isolation Panel    Frequency of Communication with Friends and Family: More than three times a week    Frequency of Social Gatherings with Friends and Family: Twice a week    Attends Religious Services: More than 4 times per year    Active Member of [employer] or Organizations: Yes    Attends Banker Meetings: More than 4 times per year    Marital Status: Widowed  Intimate Partner Violence: Not At Risk (11/04/2023)   Epic    Fear of Current or Ex-Partner: No    Emotionally Abused: No    Physically Abused: No    Sexually Abused: No  Depression (PHQ2-9): Medium Risk (07/05/2021)   Depression (PHQ2-9)    PHQ-2 Score: 10  Alcohol Screen: Not on file  Housing: Low Risk (11/04/2023)   Epic    Unable to Pay for Housing in the Last Year: No    Number of Times Moved in the Last Year: 0    Homeless in the Last  Year: No  Utilities: Not At Risk (11/04/2023)   Epic    Threatened with loss of utilities: No  Health Literacy: Not on file       Objective:  There were no  vitals taken for this visit. {Pulm Vitals (Optional):32837}  Physical Exam  Diagnostic Review:  {Labs (Optional):32838}    CT chest wo con 11/30/2023 1. Stable bibasilar scarring or atelectasis. No acute airspace disease. 2. Stable cardiomegaly. 3. Aortic Atherosclerosis (ICD10-I70.0). Coronary artery atherosclerosis. Assessment & Plan:   Assessment & Plan    No follow-ups on file.    Marny Patch, MD Pulmonary and Critical Care Medicine [facility]     [1]  Current Outpatient Medications:    ADVAIR DISKUS 100-50 MCG/ACT AEPB, Inhale 1 puff into the lungs 2 (two) times daily., Disp: , Rfl:    albuterol  (VENTOLIN  HFA) 108 (90 Base) MCG/ACT inhaler, Inhale 1-2 puffs into the lungs every 6 (six) hours as needed for wheezing or shortness of breath., Disp: , Rfl:    amLODipine  (NORVASC ) 5 MG tablet, Take 1 tablet (5 mg total) by mouth daily. (Patient taking differently: Take 2.5 mg by mouth daily.), Disp: , Rfl:    ARIPiprazole  (ABILIFY ) 10 MG tablet, Take 10 mg by mouth every evening. (Patient not taking: Reported on 11/05/2023), Disp: , Rfl:    benzonatate  (TESSALON ) 200 MG capsule, Take 1 capsule (200 mg total) by mouth 3 (three) times daily as needed for cough., Disp: 20 capsule, Rfl: 0   buPROPion  (WELLBUTRIN  XL) 150 MG 24 hr tablet, Take 150 mg by mouth daily., Disp: , Rfl:    busPIRone  (BUSPAR ) 15 MG tablet, Take 15 mg by mouth 2 (two) times daily., Disp: , Rfl:    Calcium  Carb-Cholecalciferol  (CALCIUM  600+D3) 600-10 MG-MCG TABS, Take 1 tablet by mouth daily in the afternoon., Disp: , Rfl:    carvedilol  (COREG ) 3.125 MG tablet, Take 3.125 mg by mouth 2 (two) times daily., Disp: , Rfl:    cetirizine (ZYRTEC) 10 MG tablet, Take 10 mg by mouth daily., Disp: , Rfl:     dextromethorphan -guaiFENesin  (MUCINEX  DM) 30-600 MG 12hr tablet, Take 1 tablet by mouth 2 (two) times daily., Disp: , Rfl:    diclofenac  Sodium (VOLTAREN ) 1 % GEL, Apply 4 g topically 4 (four) times daily., Disp: 100 g, Rfl: 0   donepezil  (ARICEPT ) 10 MG tablet, Take 1 tablet (10 mg total) by mouth at bedtime. For memory loss, Disp: 90 tablet, Rfl: 1   doxycycline  (VIBRAMYCIN ) 100 MG capsule, Take 1 capsule (100 mg total) by mouth 2 (two) times daily., Disp: 13 capsule, Rfl: 0   ELIQUIS  2.5 MG TABS tablet, TAKE 1 TABLET BY MOUTH 2 TIMES DAILY, Disp: 60 tablet, Rfl: 6   FEROSUL 325 (65 Fe) MG tablet, Take 325 mg by mouth 2 (two) times daily., Disp: , Rfl:    furosemide  (LASIX ) 40 MG tablet, Take 1 tablet (40 mg total) by mouth daily for 7 days., Disp: 7 tablet, Rfl: 0   gabapentin  (NEURONTIN ) 100 MG capsule, Take 200 mg by mouth 3 (three) times daily., Disp: , Rfl:    hydrALAZINE  (APRESOLINE ) 25 MG tablet, TAKE 1 TABLET BY MOUTH 3 TIMES DAILY (Patient taking differently: Take 25 mg by mouth 3 (three) times daily.), Disp: 90 tablet, Rfl: 5   Menthol , Topical Analgesic, (BIOFREEZE ROLL-ON) 4 % GEL, Apply 1 application  topically in the morning and at bedtime. Apply to left and right shoulder for pain, Disp: , Rfl:    Multiple Vitamin (GNP ESSENTIAL ONE DAILY) TABS, TAKE 1 TABLET BY MOUTH EVERY DAY (Patient taking differently: Take 1 tablet by mouth daily after breakfast.), Disp: 30 tablet, Rfl: 11   Nutritional Supplements (ENSURE HIGH  PROTEIN) LIQD, Take 237 mLs by mouth daily., Disp: , Rfl:    omeprazole  (PRILOSEC) 40 MG capsule, Take 40 mg by mouth daily., Disp: , Rfl:    polyethylene glycol (MIRALAX  / GLYCOLAX ) 17 g packet, Take 17 g by mouth daily as needed for mild constipation., Disp: , Rfl:    pravastatin  (PRAVACHOL ) 40 MG tablet, Take 2 tablets (80 mg total) by mouth daily., Disp: 60 tablet, Rfl: 5   saccharomyces boulardii (FLORASTOR) 250 MG capsule, Take 1 capsule (250 mg total) by mouth 2  (two) times daily., Disp: , Rfl:    sertraline  (ZOLOFT ) 100 MG tablet, Take 100 mg by mouth daily., Disp: , Rfl:    TYLENOL  500 MG tablet, Take 1,000 mg by mouth in the morning, at noon, and at bedtime., Disp: , Rfl:   "

## 2024-01-20 ENCOUNTER — Ambulatory Visit

## 2024-01-31 ENCOUNTER — Emergency Department (HOSPITAL_COMMUNITY)

## 2024-01-31 ENCOUNTER — Emergency Department (HOSPITAL_COMMUNITY)
Admission: EM | Admit: 2024-01-31 | Discharge: 2024-01-31 | Disposition: A | Discharge status: Skilled Nursing Facility | Attending: Emergency Medicine | Admitting: Emergency Medicine

## 2024-01-31 ENCOUNTER — Other Ambulatory Visit: Payer: Self-pay

## 2024-01-31 DIAGNOSIS — N3000 Acute cystitis without hematuria: Secondary | ICD-10-CM | POA: Insufficient documentation

## 2024-01-31 DIAGNOSIS — Z91040 Latex allergy status: Secondary | ICD-10-CM | POA: Diagnosis not present

## 2024-01-31 DIAGNOSIS — R55 Syncope and collapse: Secondary | ICD-10-CM | POA: Diagnosis present

## 2024-01-31 LAB — URINALYSIS, ROUTINE W REFLEX MICROSCOPIC
Bilirubin Urine: NEGATIVE
Glucose, UA: NEGATIVE mg/dL
Ketones, ur: NEGATIVE mg/dL
Nitrite: POSITIVE — AB
Protein, ur: 100 mg/dL — AB
Specific Gravity, Urine: 1.014 (ref 1.005–1.030)
WBC, UA: >50 WBC/hpf (ref 0–5)
pH: 5 (ref 5.0–8.0)

## 2024-01-31 LAB — CBC WITH DIFFERENTIAL/PLATELET
Abs Immature Granulocytes: 0.01 10*3/uL (ref 0.00–0.07)
Basophils Absolute: 0 10*3/uL (ref 0.0–0.1)
Basophils Relative: 1 %
Eosinophils Absolute: 0.2 10*3/uL (ref 0.0–0.5)
Eosinophils Relative: 6 %
HCT: 35.3 % — ABNORMAL LOW (ref 36.0–46.0)
Hemoglobin: 11.5 g/dL — ABNORMAL LOW (ref 12.0–15.0)
Immature Granulocytes: 0 %
Lymphocytes Relative: 20 %
Lymphs Abs: 0.7 10*3/uL (ref 0.7–4.0)
MCH: 32.1 pg (ref 26.0–34.0)
MCHC: 32.6 g/dL (ref 30.0–36.0)
MCV: 98.6 fL (ref 80.0–100.0)
Monocytes Absolute: 0.4 10*3/uL (ref 0.1–1.0)
Monocytes Relative: 11 %
Neutro Abs: 2.3 10*3/uL (ref 1.7–7.7)
Neutrophils Relative %: 62 %
Platelets: 190 10*3/uL (ref 150–400)
RBC: 3.58 MIL/uL — ABNORMAL LOW (ref 3.87–5.11)
RDW: 13.4 % (ref 11.5–15.5)
WBC: 3.7 10*3/uL — ABNORMAL LOW (ref 4.0–10.5)
nRBC: 0 % (ref 0.0–0.2)

## 2024-01-31 LAB — BASIC METABOLIC PANEL WITH GFR
Anion gap: 8 (ref 5–15)
BUN: 22 mg/dL (ref 8–23)
CO2: 23 mmol/L (ref 22–32)
Calcium: 10 mg/dL (ref 8.9–10.3)
Chloride: 110 mmol/L (ref 98–111)
Creatinine, Ser: 1.36 mg/dL — ABNORMAL HIGH (ref 0.44–1.00)
GFR, Estimated: 38 mL/min — ABNORMAL LOW
Glucose, Bld: 81 mg/dL (ref 70–99)
Potassium: 5.1 mmol/L (ref 3.5–5.1)
Sodium: 142 mmol/L (ref 135–145)

## 2024-01-31 MED ORDER — ACETAMINOPHEN 325 MG PO TABS
650.0000 mg | ORAL_TABLET | Freq: Once | ORAL | Status: AC
  Administered 2024-01-31: 650 mg via ORAL
  Filled 2024-01-31: qty 2

## 2024-01-31 MED ORDER — CEPHALEXIN 500 MG PO CAPS
500.0000 mg | ORAL_CAPSULE | Freq: Once | ORAL | Status: AC
  Administered 2024-01-31: 500 mg via ORAL
  Filled 2024-01-31: qty 1

## 2024-01-31 MED ORDER — CEPHALEXIN 500 MG PO CAPS: 500.0000 mg | ORAL_CAPSULE | Freq: Four times a day (QID) | ORAL | 0 refills | Status: AC

## 2024-01-31 NOTE — ED Provider Notes (Signed)
 " [place] EMERGENCY DEPARTMENT AT [facility] Provider Note   CSN: [id number] Arrival date & time: 01/31/24  9072     Patient presents with: No chief complaint on file.   Susan [name] is a 84 y.o. female.   HPI 84 year old female presenting to the ER today after a syncopal episode.  Daughter is accompanying patient and is able to provide history.  Daughter reports that she was at the nursing home having when the staff reports that she went unresponsive and had to sternal rub to be awoken.  Daughter states that she is now acting normal.  Patient is alert and oriented x 4.  Patient denies any pain.  Patient denies any symptoms.    Prior to Admission medications  Medication Sig Start Date End Date Taking? Authorizing Provider  ADVAIR DISKUS 100-50 MCG/ACT AEPB Inhale 1 puff into the lungs 2 (two) times daily. 10/16/23   [provider]  albuterol  (VENTOLIN  HFA) 108 (90 Base) MCG/ACT inhaler Inhale 1-2 puffs into the lungs every 6 (six) hours as needed for wheezing or shortness of breath.    [provider]  amLODipine  (NORVASC ) 5 MG tablet Take 1 tablet (5 mg total) by mouth daily. Patient taking differently: Take 2.5 mg by mouth daily. 03/12/23   Drusilla Sabas RAMAN, MD  ARIPiprazole  (ABILIFY ) 10 MG tablet Take 10 mg by mouth every evening. Patient not taking: Reported on 11/05/2023    [provider]  benzonatate  (TESSALON ) 200 MG capsule Take 1 capsule (200 mg total) by mouth 3 (three) times daily as needed for cough. 11/30/23   Melvenia Motto, MD  buPROPion  (WELLBUTRIN  XL) 150 MG 24 hr tablet Take 150 mg by mouth daily. 10/14/22   [provider]  busPIRone  (BUSPAR ) 15 MG tablet Take 15 mg by mouth 2 (two) times daily.    [provider]  Calcium  Carb-Cholecalciferol  (CALCIUM  600+D3) 600-10 MG-MCG TABS Take 1 tablet by mouth daily in the afternoon.    [provider]  carvedilol  (COREG ) 3.125 MG tablet Take 3.125 mg by mouth 2 (two)  times daily. 10/27/23   [provider]  cetirizine (ZYRTEC) 10 MG tablet Take 10 mg by mouth daily. 06/20/21   [provider]  dextromethorphan -guaiFENesin  (MUCINEX  DM) 30-600 MG 12hr tablet Take 1 tablet by mouth 2 (two) times daily.    [provider]  diclofenac  Sodium (VOLTAREN ) 1 % GEL Apply 4 g topically 4 (four) times daily. 09/22/23   Emil Share, DO  donepezil  (ARICEPT ) 10 MG tablet Take 1 tablet (10 mg total) by mouth at bedtime. For memory loss 05/25/18   Caro Harlene POUR, NP  doxycycline  (VIBRAMYCIN ) 100 MG capsule Take 1 capsule (100 mg total) by mouth 2 (two) times daily. 01/05/24   Patsey Lot, MD  ELIQUIS  2.5 MG TABS tablet TAKE 1 TABLET BY MOUTH 2 TIMES DAILY 01/04/22   Lelon Hamilton T, PA-C  FEROSUL 325 (65 Fe) MG tablet Take 325 mg by mouth 2 (two) times daily. 06/21/21   [provider]  furosemide  (LASIX ) 40 MG tablet Take 1 tablet (40 mg total) by mouth daily for 7 days. 11/30/23 12/07/23  Melvenia Motto, MD  gabapentin  (NEURONTIN ) 100 MG capsule Take 200 mg by mouth 3 (three) times daily.    [provider]  hydrALAZINE  (APRESOLINE ) 25 MG tablet TAKE 1 TABLET BY MOUTH 3 TIMES DAILY Patient taking differently: Take 25 mg by mouth 3 (three) times daily. 06/17/18   Caro Harlene POUR, NP  Menthol , Topical Analgesic, (BIOFREEZE ROLL-ON) 4 % GEL Apply 1 application  topically in the morning and at bedtime. Apply to left and right shoulder for pain    [provider]  Multiple Vitamin (GNP ESSENTIAL ONE DAILY) TABS TAKE 1 TABLET BY MOUTH EVERY DAY Patient taking differently: Take 1 tablet by mouth daily after breakfast. 06/17/18   Eubanks, Jessica K, NP  Nutritional Supplements (ENSURE HIGH PROTEIN) LIQD Take 237 mLs by mouth daily.    [provider]  omeprazole  (PRILOSEC) 40 MG capsule Take 40 mg by mouth daily.    [provider]  polyethylene glycol (MIRALAX  / GLYCOLAX ) 17 g packet Take 17 g by mouth daily  as needed for mild constipation. 03/11/23   Drusilla Sabas RAMAN, MD  pravastatin  (PRAVACHOL ) 40 MG tablet Take 2 tablets (80 mg total) by mouth daily. 08/04/23   Rosemarie Eather RAMAN, MD  saccharomyces boulardii (FLORASTOR) 250 MG capsule Take 1 capsule (250 mg total) by mouth 2 (two) times daily. 03/11/23   Drusilla Sabas RAMAN, MD  sertraline  (ZOLOFT ) 100 MG tablet Take 100 mg by mouth daily.    [provider]  TYLENOL  500 MG tablet Take 1,000 mg by mouth in the morning, at noon, and at bedtime.    [provider]    Allergies: Ace inhibitors, Lipitor [atorvastatin], Simvastatin, Abilify  [aripiprazole ], and Latex    Review of Systems  All other systems reviewed and are negative.   Updated Vital Signs BP (!) 162/72 (BP Location: Right Arm)   Pulse 78   Temp 98.8 F (37.1 C) (Oral)   Resp 18   SpO2 97%   Physical Exam Vitals and nursing note reviewed.  HENT:     Mouth/Throat:     Pharynx: Oropharynx is clear.  Cardiovascular:     Rate and Rhythm: Normal rate.     Pulses: Normal pulses.  Pulmonary:     Effort: Pulmonary effort is normal.     Breath sounds: Normal breath sounds.  Abdominal:     General: Bowel sounds are normal. There is no distension.     Palpations: Abdomen is soft. There is no mass.     Tenderness: There is no abdominal tenderness. There is no guarding or rebound.     Hernia: A hernia is present.  Skin:    General: Skin is warm and dry.  Neurological:     General: No focal deficit present.     Mental Status: She is alert.     Cranial Nerves: Cranial nerves 2-12 are intact.     Sensory: Sensation is intact.     Motor: Motor function is intact.     (all labs ordered are listed, but only abnormal results are displayed) Labs Reviewed  BASIC METABOLIC PANEL WITH GFR  CBC WITH DIFFERENTIAL/PLATELET  URINALYSIS, ROUTINE W REFLEX MICROSCOPIC    EKG: None  Radiology: No results found.   Procedures   Medications Ordered in the ED - No data to  display                                  Medical Decision Making Amount and/or Complexity of Data Reviewed Labs: ordered. Radiology: ordered.  Risk OTC drugs. Prescription drug management.   Impression: 84 year old female presenting with previous altered level of consciousness.  Differential diagnosis include stroke, A-fib, pneumonia, acute cystitis  Additional History: Patient and daughter were able to provide history.  I also  reviewed outpatient notes.  Labs: CBC showed no acute changes.  BMP showed no acute changes urinalysis showed nitrate positive, large leukocytes and bacteria.  Urine culture pending  Imaging: Portable chest x-ray was performed and showed no acute pulmonary or cardiac abnormalities.  I reviewed these images and agree with radiology's interpretation.  EKG was performed and showed sinus arrhythmia.  ED Course/Meds: 84 year old female presenting with altered level of consciousness.  Patient was well-appearing and in no acute distress.  Daughter reports that she was called from the skilled nursing facility and told that her mom had a altered level of consciousness and required a sternal rub to be able to be awoken.  On exam here patient was conscious alert and oriented x 4.  Patient had no neurodeficits.  Patient only complained of some soreness where she had been sternal rubbed.  Due to the history being altered though a workup was performed.  There were no signs of infection on her CBC.  There were electrolyte abnormalities on her BMP.  Her chest x-ray showed no signs of pneumonia.  EKG showed no acute changes to her rhythm.  However urinalysis did show that she had acute cystitis.  Family reports that this is her third 1 in the past 6 months.  Patient reports that she is not getting cleaned well or very often at her skilled nursing facility.  I talked to both the patient and the family on talking to the facility to see if this could be changed to decrease the amount of  UTIs that she is having.  Patient reported while she was here she was having some shoulder pain.  Patient was given Tylenol  and reports that her pain went away.  Pain was likely due to the way that she was sitting in the bed.  Patient was also given a dose of Keflex  while she was here and a prescription was sent to her pharmacy.  Patient and family agreed to the plan stated above.  All questions were answered.  Educated on signs and symptoms of when to return to the ER.  Patient remained stable while in the ER and at discharge.      Final diagnoses:  None    ED Discharge Orders     None          Rosaline Almarie MATSU, [place] 01/31/24 1706  "

## 2024-01-31 NOTE — ED Triage Notes (Signed)
 Pt BIBA from [facility]. Staff reports she was slower to wake up. Pt states she was sleeping. Staff did sternal rub and now pt hurts right there. Given aspirin . Daughter requested transport.  150/84 Hr 84 97 ra Cbg 132

## 2024-01-31 NOTE — ED Notes (Signed)
 Attempted to call report to [facility].  Unsuccessful at this time

## 2024-01-31 NOTE — ED Notes (Signed)
 [facility] called at  this time.

## 2024-01-31 NOTE — Discharge Instructions (Signed)
 You were seen today in the ER for altered consciousness.  On testing it was found that you had a urinary tract infection.  I have sent a antibiotic into your preferred pharmacy.  I see that she received the antibiotic you may begin taking it.  I also recommend that you request that the staff at the nursing home do more frequent changes due to recent UTIs.  I would also follow-up with the doctor at the facility if the symptoms continue.  If your symptoms worsen or you start to develop any altered mental status, loss of consciousness, fever, chills, abdominal pain please return to the ER.

## 2024-01-31 NOTE — ED Notes (Signed)
Patient provided with sandwich and ginger ale per request 

## 2024-02-02 LAB — URINE CULTURE: Culture: >=100000 — AB

## 2024-02-03 ENCOUNTER — Telehealth (HOSPITAL_BASED_OUTPATIENT_CLINIC_OR_DEPARTMENT_OTHER): Payer: Self-pay

## 2024-02-03 NOTE — Telephone Encounter (Signed)
 Post ED Visit - Positive Culture Follow-up  Culture report reviewed by antimicrobial stewardship pharmacist: [facility] [x]  [place], [place].D. []  Venetia Gully, [address].D., BCPS AQ-ID []  Garrel Crews, Pharm.D., BCPS []  Almarie Lunger, Pharm.D., BCPS []  [place], [place].D., BCPS, AAHIVP []  Rosaline Bihari, Pharm.D., BCPS, AAHIVP []  Vernell Meier, PharmD, BCPS []  Latanya Hint, PharmD, BCPS []  Donald Medley, PharmD, BCPS []  Rocky Bold, PharmD []  Dorothyann Alert, PharmD, BCPS []  Morene Babe, PharmD  [facility] []  Rosaline Edison, PharmD []  Romona Bliss, PharmD []  Dolphus Roller, PharmD []  Veva Seip, Rph []  Vernell Daunt) Leonce, PharmD []  Eva Allis, PharmD []  Rosaline Millet, PharmD []  Iantha Batch, PharmD []  Arvin Gauss, PharmD []  Wanda Hasting, PharmD []  Ronal Rav, PharmD []  Rocky Slade, PharmD []  Bard Jeans, PharmD   Positive urine culture Treated with Cephalexin , organism sensitive to the same and no further patient follow-up is required at this time.  Susan [name] 02/03/2024, 10:11 AM

## 2024-02-11 ENCOUNTER — Ambulatory Visit

## 2024-02-11 VITALS — BP 121/82 | HR 76 | Temp 98.4°F | Ht 61.0 in | Wt 186.3 lb

## 2024-02-11 DIAGNOSIS — J45909 Unspecified asthma, uncomplicated: Secondary | ICD-10-CM

## 2024-02-11 DIAGNOSIS — J449 Chronic obstructive pulmonary disease, unspecified: Secondary | ICD-10-CM

## 2024-02-11 DIAGNOSIS — R053 Chronic cough: Secondary | ICD-10-CM | POA: Diagnosis not present

## 2024-02-11 DIAGNOSIS — R1310 Dysphagia, unspecified: Secondary | ICD-10-CM | POA: Diagnosis not present

## 2024-02-11 DIAGNOSIS — Z87891 Personal history of nicotine dependence: Secondary | ICD-10-CM | POA: Diagnosis not present

## 2024-02-11 MED ORDER — ARFORMOTEROL TARTRATE 15 MCG/2ML IN NEBU: 15.0000 ug | INHALATION_SOLUTION | Freq: Two times a day (BID) | RESPIRATORY_TRACT | 6 refills | Status: AC

## 2024-02-11 MED ORDER — BENZONATATE 200 MG PO CAPS: 200.0000 mg | ORAL_CAPSULE | Freq: Three times a day (TID) | ORAL | 0 refills | Status: AC | PRN

## 2024-02-11 MED ORDER — BUDESONIDE 0.25 MG/2ML IN SUSP: 0.2500 mg | Freq: Two times a day (BID) | RESPIRATORY_TRACT | 0 refills | Status: AC

## 2024-02-11 NOTE — Patient Instructions (Addendum)
 Dear Ms. Tapper,   Given your stroke, It would be difficult to use the inhaler properly for you COPD/asthma. I will get the medications on nebulizer form.  -Arformoterol , twice a day by nebulizer -Budesonide  twice a day by nebulizer -Stop the Breo inhaler -Albuterol  as need it for shortness of breath every 6-8 hours.   I think your cough can be associated to swallowing problems: -I order a xray for swallowing evaluation.  -I order a speech and swallow therapy evaluation as well.   -I sent tessalon  pills 3 times as need it for cough.   I will see you in 3 months.

## 2024-02-12 ENCOUNTER — Telehealth: Payer: Self-pay

## 2024-02-12 ENCOUNTER — Telehealth (HOSPITAL_COMMUNITY): Payer: Self-pay

## 2024-02-12 NOTE — Telephone Encounter (Signed)
 I called and spoke with [name] (DPR) and notified that Abilify  is on the pts allergy list and I have removed med completely from her med list.  Nothing further needed.

## 2024-02-12 NOTE — Telephone Encounter (Signed)
 Attempted to contact patient at  [phone] to schedule OP MBS (swallow study). Unable to leave voicemail. Will call again next week or patient can reach out to acute rehab office [phone].

## 2024-02-12 NOTE — Addendum Note (Signed)
 Addended byBETHA FRIES, Rodolfo Gaster A on: 02/12/2024 10:57 AM   Modules accepted: Orders

## 2024-02-12 NOTE — Telephone Encounter (Signed)
 Copied from CRM [id number]. Topic: Medical Record Request - Patient Chart Correction Request >> Feb 12, 2024 10:09 AM Russell PARAS wrote: Reason for CRM:   Pt's daughter Dwayne contacted clinic regarding her last AVS. Her mother does have allergy to ABILIFY  and it is noted in her chart. However, Abilify  appears on her medication list and she is concerned this could cause confusion in the future and would like it removed from her chart.   Requested call back with status update once removed  CB#  [phone]

## 2024-05-11 ENCOUNTER — Ambulatory Visit
